# Patient Record
Sex: Female | Born: 1940
Health system: Southern US, Community
[De-identification: ages and names within clinical notes are randomized; demographics above are authoritative.]

## PROBLEM LIST (undated history)

## (undated) DIAGNOSIS — F32A Depression, unspecified: Secondary | ICD-10-CM

## (undated) DIAGNOSIS — C801 Malignant (primary) neoplasm, unspecified: Secondary | ICD-10-CM

## (undated) DIAGNOSIS — N390 Urinary tract infection, site not specified: Secondary | ICD-10-CM

## (undated) DIAGNOSIS — T83192A Other mechanical complication of urinary stent, initial encounter: Secondary | ICD-10-CM

## (undated) DIAGNOSIS — Z860100 Personal history of colon polyps, unspecified: Secondary | ICD-10-CM

## (undated) DIAGNOSIS — S62101A Fracture of unspecified carpal bone, right wrist, initial encounter for closed fracture: Secondary | ICD-10-CM

## (undated) DIAGNOSIS — Z8711 Personal history of peptic ulcer disease: Secondary | ICD-10-CM

## (undated) DIAGNOSIS — D509 Iron deficiency anemia, unspecified: Secondary | ICD-10-CM

## (undated) DIAGNOSIS — M1990 Unspecified osteoarthritis, unspecified site: Secondary | ICD-10-CM

## (undated) DIAGNOSIS — Z9989 Dependence on other enabling machines and devices: Secondary | ICD-10-CM

## (undated) DIAGNOSIS — I700 Atherosclerosis of aorta: Secondary | ICD-10-CM

## (undated) DIAGNOSIS — I4891 Unspecified atrial fibrillation: Secondary | ICD-10-CM

## (undated) DIAGNOSIS — I5032 Chronic diastolic (congestive) heart failure: Secondary | ICD-10-CM

## (undated) DIAGNOSIS — Z973 Presence of spectacles and contact lenses: Secondary | ICD-10-CM

## (undated) DIAGNOSIS — Z8619 Personal history of other infectious and parasitic diseases: Secondary | ICD-10-CM

## (undated) DIAGNOSIS — Z992 Dependence on renal dialysis: Secondary | ICD-10-CM

## (undated) DIAGNOSIS — N135 Crossing vessel and stricture of ureter without hydronephrosis: Secondary | ICD-10-CM

## (undated) DIAGNOSIS — N1330 Unspecified hydronephrosis: Secondary | ICD-10-CM

## (undated) DIAGNOSIS — S32000A Wedge compression fracture of unspecified lumbar vertebra, initial encounter for closed fracture: Secondary | ICD-10-CM

## (undated) DIAGNOSIS — T8859XA Other complications of anesthesia, initial encounter: Secondary | ICD-10-CM

## (undated) DIAGNOSIS — R319 Hematuria, unspecified: Secondary | ICD-10-CM

## (undated) DIAGNOSIS — Z87440 Personal history of urinary (tract) infections: Secondary | ICD-10-CM

## (undated) DIAGNOSIS — I480 Paroxysmal atrial fibrillation: Secondary | ICD-10-CM

## (undated) DIAGNOSIS — E785 Hyperlipidemia, unspecified: Secondary | ICD-10-CM

## (undated) DIAGNOSIS — Z87442 Personal history of urinary calculi: Secondary | ICD-10-CM

## (undated) DIAGNOSIS — R0600 Dyspnea, unspecified: Secondary | ICD-10-CM

## (undated) DIAGNOSIS — K5790 Diverticulosis of intestine, part unspecified, without perforation or abscess without bleeding: Secondary | ICD-10-CM

## (undated) DIAGNOSIS — K449 Diaphragmatic hernia without obstruction or gangrene: Secondary | ICD-10-CM

## (undated) DIAGNOSIS — R531 Weakness: Secondary | ICD-10-CM

## (undated) DIAGNOSIS — Z8719 Personal history of other diseases of the digestive system: Secondary | ICD-10-CM

## (undated) DIAGNOSIS — I440 Atrioventricular block, first degree: Secondary | ICD-10-CM

## (undated) DIAGNOSIS — N8111 Cystocele, midline: Secondary | ICD-10-CM

## (undated) DIAGNOSIS — F419 Anxiety disorder, unspecified: Secondary | ICD-10-CM

## (undated) DIAGNOSIS — Z8542 Personal history of malignant neoplasm of other parts of uterus: Secondary | ICD-10-CM

## (undated) DIAGNOSIS — Z7901 Long term (current) use of anticoagulants: Secondary | ICD-10-CM

## (undated) DIAGNOSIS — Z960 Presence of urogenital implants: Secondary | ICD-10-CM

## (undated) DIAGNOSIS — I10 Essential (primary) hypertension: Secondary | ICD-10-CM

## (undated) DIAGNOSIS — Z9911 Dependence on respirator [ventilator] status: Secondary | ICD-10-CM

## (undated) DIAGNOSIS — K219 Gastro-esophageal reflux disease without esophagitis: Secondary | ICD-10-CM

## (undated) DIAGNOSIS — I2510 Atherosclerotic heart disease of native coronary artery without angina pectoris: Secondary | ICD-10-CM

## (undated) HISTORY — DX: Urinary tract infection, site not specified: N39.0

## (undated) HISTORY — PX: CARDIOVERSION: SHX1299

## (undated) HISTORY — PX: TRANSTHORACIC ECHOCARDIOGRAM: SHX275

## (undated) HISTORY — PX: JOINT REPLACEMENT: SHX530

## (undated) HISTORY — PX: NEPHROLITHOTOMY: SUR881

## (undated) HISTORY — PX: EYE SURGERY: SHX253

## (undated) HISTORY — PX: BACK SURGERY: SHX140

## (undated) HISTORY — PX: TOTAL KNEE ARTHROPLASTY: SHX125

## (undated) HISTORY — PX: APPENDECTOMY: SHX54

## (undated) HISTORY — DX: Other mechanical complication of indwelling ureteral stent, initial encounter: T83.192A

---

## 1987-10-28 HISTORY — PX: TOTAL ABDOMINAL HYSTERECTOMY W/ BILATERAL SALPINGOOPHORECTOMY: SHX83

## 2005-09-02 ENCOUNTER — Ambulatory Visit (HOSPITAL_COMMUNITY): Admission: RE | Admit: 2005-09-02 | Discharge: 2005-09-02 | Payer: Self-pay | Admitting: Urology

## 2005-10-03 ENCOUNTER — Ambulatory Visit (HOSPITAL_BASED_OUTPATIENT_CLINIC_OR_DEPARTMENT_OTHER): Admission: RE | Admit: 2005-10-03 | Discharge: 2005-10-03 | Payer: Self-pay | Admitting: Urology

## 2005-10-03 ENCOUNTER — Ambulatory Visit (HOSPITAL_COMMUNITY): Admission: RE | Admit: 2005-10-03 | Discharge: 2005-10-03 | Payer: Self-pay | Admitting: Urology

## 2005-10-04 HISTORY — PX: OTHER SURGICAL HISTORY: SHX169

## 2006-03-04 ENCOUNTER — Ambulatory Visit (HOSPITAL_COMMUNITY): Admission: RE | Admit: 2006-03-04 | Discharge: 2006-03-04 | Payer: Self-pay | Admitting: Urology

## 2006-04-03 ENCOUNTER — Ambulatory Visit (HOSPITAL_BASED_OUTPATIENT_CLINIC_OR_DEPARTMENT_OTHER): Admission: RE | Admit: 2006-04-03 | Discharge: 2006-04-03 | Payer: Self-pay | Admitting: Urology

## 2006-04-03 HISTORY — PX: OTHER SURGICAL HISTORY: SHX169

## 2006-05-04 ENCOUNTER — Ambulatory Visit (HOSPITAL_COMMUNITY): Admission: RE | Admit: 2006-05-04 | Discharge: 2006-05-04 | Payer: Self-pay | Admitting: Urology

## 2008-10-27 HISTORY — PX: CHOLECYSTECTOMY: SHX55

## 2009-07-10 ENCOUNTER — Encounter: Admission: RE | Admit: 2009-07-10 | Discharge: 2009-07-10 | Payer: Self-pay

## 2011-03-14 NOTE — Op Note (Signed)
[name], [name]             ACCOUNT NO.:  [id number]   MEDICAL RECORD NO.:  [id number]          PATIENT TYPE:  AMB   LOCATION:  [facility]                         FACILITY:  [facility]   PHYSICIAN:  Sigmund I. Gaynelle Arabian, M.D.DATE OF BIRTH:  September 26, 1941   DATE OF PROCEDURE:  10/04/2005  DATE OF DISCHARGE:                                 OPERATIVE REPORT   PREOPERATIVE DIAGNOSIS:  History of bilateral ureteral stones with right  ureteral stricture.   POSTOPERATIVE DIAGNOSIS:  History of bilateral ureteral stones with right  ureteral stricture.   OPERATION:  1.  Cystourethroscopy.  2.  Bilateral retrograde pyelogram with interpretation.  3.  Right ureteroscopy.  4.  Right double-J catheter (6 x 28 cm [employer]).   PREPARATION:  After appropriate preanesthesia, the patient is brought to the  operating room, placed on the operating table in dorsal supine position  where general LMA anesthesia was introduced. She was then re-placed in  dorsal lithotomy position where the pubis was prepped with Betadine solution  and draped in usual fashion.   REVIEW OF HISTORY:  This 70 year old female has had multiple bilateral  ureteroscope procedures for kidney stones, and suffers from right ureteral  strictures postoperatively. She is now for ureteroscopic delineation of  stricture disease.   Note, she has a nuclear medicine scan, which shows 46% flow in the left, 53%  in the right, with normal flow, normal cortical uptake, normal excretion and  clearance within the left kidney. There was no evidence for high-grade  stricture on the right, and there was normal flow, normal cortical uptake,  and excretion from the right kidney. However, there was impaired clearance  from the right collecting system consistent with partial obstruction.   The CT scan for mesh per radiology shows interval obstructive right  hydronephrosis and right ureterectasis extending to the pelvic ureter on  examination  of September2006.   The patient is now for examination.   PROCEDURE:  Cystourethroscopy was accomplished and showed normal-appearing  bladder with clear reflux from both orifices. Left retrograde pyelogram was  performed which showed no obstruction, no hydronephrosis. There was  excellent emptying.   A right retrograde pyelogram is performed, which showed the patient had  ureterectasis on the right side with suggestion of a stricture at the level  of the ureteral vesicle junction or just slightly higher. In addition, the  patient seemed to have some narrowing in the upper third ureter. There was  no stone identified bilaterally. The ureter mucosa was normal bilaterally.   Ureteroscopy was accomplished on the right side, and the 6-French  ureteroscope was able to be passed into the kidney without difficulty. There  was some tightening at the lower area, but is not felt to be severely  strictured. The ureteroscope went freely into the renal pelvis. Becausre of  history of ureteral scarring, and because of current instrumentation,  it  was elected to place a JJ catheter. Therefore, a 18F X 28cm [employer]  JJ catheter was inserted in the usual fashion, over the guidewire.   It was felt that the patient did have some scarring  of the muscular portion  of the ureter in the right lower portion, but it was not obstructive, and I  do not think she needs to have open surgical repair at this time. She does  need to have her Lasix renal scan followed to see if it changes, however.  Note, she had B&O suppository.      Sigmund I. Gaynelle Arabian, M.D.  Electronically Signed     SIT/MEDQ  D:  10/03/2005  T:  10/03/2005  Job:  [id number]

## 2011-03-14 NOTE — Op Note (Signed)
[name], [name] [name]             ACCOUNT NO.:  [id number]   MEDICAL RECORD NO.:  [id number]          PATIENT TYPE:  AMB   LOCATION:  [facility]                         [facility]ACILITY:  [facility]   PHYSICIAN:  Sigmund I. Gaynelle Arabian, M.D.DATE OF BIRTH:  [birth year]   DATE OF PROCEDURE:  04/03/2006  DATE OF DISCHARGE:                                 OPERATIVE REPORT   PREOPERATIVE DIAGNOSIS:  Impacted left lower ureteral calculus.   POSTOPERATIVE DIAGNOSIS:  Impacted left lower ureteral calculus.   OPERATION:  Cystourethroscopy, left retrograde pyelogram with  interpretation, left ureteroscopy, basket extraction of left ureteral  calculus.   SURGEON:  Sigmund I. Gaynelle Arabian, M.D.   ANESTHESIA:  General LMA.   PREPARATION:  After appropriate preanesthesia, the patient is brought to the  operating room, placed on the operating room table in the dorsal supine  position where general LMA anesthesia was induced.  The patient was then  replaced in the dorsal lithotomy position where the pubis was prepped with  Betadine solution and draped in the usual fashion.   REVIEW OF HISTORY:  This 70 year old female has a history of multiple  recurrent stones and currently has a left lower ureteral calculus measuring  3 mm, which is impacted, and not progressive.  She is now for basket  extraction.   PROCEDURE:  Left retrograde pyelogram was performed which showed that the  patient had a 3 mm calculus in the left lower third ureter.  It is in the  same position as seen on CT scan approximately 1-2 weeks ago.  The patient  has been unable pass her stone and has continued to have intermittent colic.   Following retrograde pyelography, a guidewire was passed around the stone  into the left renal pelvis and coiled.  This served as a Chiropodist.  A 6.5  French short ureteroscope was then passed through the left ureteral orifice  into the left ureter.  The stone was identified and basket extracted.  The  area of  stone was easily identified, but it was felt that the patient did  not have to have a double-J catheter.  Therefore, she was given a BNO  suppository, as well as IV Toradol, awakened and taken to the recovery room  in good condition.      Sigmund I. Gaynelle Arabian, M.D.  Electronically Signed     SIT/MEDQ  D:  04/03/2006  T:  04/04/2006  Job:  [id number]

## 2012-04-26 HISTORY — PX: LUMBAR FUSION: SHX111

## 2012-10-27 DIAGNOSIS — Z9911 Dependence on respirator [ventilator] status: Secondary | ICD-10-CM

## 2012-10-27 DIAGNOSIS — N189 Chronic kidney disease, unspecified: Secondary | ICD-10-CM | POA: Insufficient documentation

## 2012-10-27 DIAGNOSIS — Z9289 Personal history of other medical treatment: Secondary | ICD-10-CM

## 2012-10-27 HISTORY — DX: Personal history of other medical treatment: Z92.89

## 2012-10-27 HISTORY — DX: Chronic kidney disease, unspecified: N18.9

## 2012-10-27 HISTORY — DX: Dependence on respirator (ventilator) status: Z99.11

## 2012-10-27 HISTORY — PX: PERCUTANEOUS NEPHROSTOMY: SHX2208

## 2013-05-18 DIAGNOSIS — Z006 Encounter for examination for normal comparison and control in clinical research program: Secondary | ICD-10-CM | POA: Diagnosis not present

## 2013-05-18 DIAGNOSIS — I10 Essential (primary) hypertension: Secondary | ICD-10-CM | POA: Diagnosis not present

## 2013-05-18 DIAGNOSIS — E7800 Pure hypercholesterolemia, unspecified: Secondary | ICD-10-CM | POA: Diagnosis not present

## 2013-06-02 HISTORY — PX: OTHER SURGICAL HISTORY: SHX169

## 2013-06-04 ENCOUNTER — Encounter (HOSPITAL_COMMUNITY): Payer: Self-pay | Admitting: Adult Health

## 2013-06-04 ENCOUNTER — Inpatient Hospital Stay (HOSPITAL_COMMUNITY)
Admission: AD | Admit: 2013-06-04 | Discharge: 2013-06-16 | DRG: 853 | Disposition: A | Discharge status: Home Health | Payer: Medicare Other | Source: Other Acute Inpatient Hospital | Attending: Internal Medicine | Admitting: Internal Medicine

## 2013-06-04 ENCOUNTER — Inpatient Hospital Stay (HOSPITAL_COMMUNITY): Payer: Medicare Other

## 2013-06-04 DIAGNOSIS — E876 Hypokalemia: Secondary | ICD-10-CM | POA: Diagnosis not present

## 2013-06-04 DIAGNOSIS — J9601 Acute respiratory failure with hypoxia: Secondary | ICD-10-CM | POA: Diagnosis present

## 2013-06-04 DIAGNOSIS — I428 Other cardiomyopathies: Secondary | ICD-10-CM | POA: Diagnosis present

## 2013-06-04 DIAGNOSIS — E874 Mixed disorder of acid-base balance: Secondary | ICD-10-CM | POA: Diagnosis present

## 2013-06-04 DIAGNOSIS — I4891 Unspecified atrial fibrillation: Secondary | ICD-10-CM | POA: Diagnosis present

## 2013-06-04 DIAGNOSIS — R7881 Bacteremia: Secondary | ICD-10-CM | POA: Diagnosis present

## 2013-06-04 DIAGNOSIS — A4151 Sepsis due to Escherichia coli [E. coli]: Principal | ICD-10-CM | POA: Diagnosis present

## 2013-06-04 DIAGNOSIS — Z87440 Personal history of urinary (tract) infections: Secondary | ICD-10-CM

## 2013-06-04 DIAGNOSIS — B9620 Unspecified Escherichia coli [E. coli] as the cause of diseases classified elsewhere: Secondary | ICD-10-CM | POA: Diagnosis present

## 2013-06-04 DIAGNOSIS — R310 Gross hematuria: Secondary | ICD-10-CM | POA: Diagnosis not present

## 2013-06-04 DIAGNOSIS — Z79899 Other long term (current) drug therapy: Secondary | ICD-10-CM

## 2013-06-04 DIAGNOSIS — I214 Non-ST elevation (NSTEMI) myocardial infarction: Secondary | ICD-10-CM | POA: Diagnosis present

## 2013-06-04 DIAGNOSIS — Y849 Medical procedure, unspecified as the cause of abnormal reaction of the patient, or of later complication, without mention of misadventure at the time of the procedure: Secondary | ICD-10-CM | POA: Diagnosis not present

## 2013-06-04 DIAGNOSIS — N1832 Chronic kidney disease, stage 3b: Secondary | ICD-10-CM | POA: Diagnosis present

## 2013-06-04 DIAGNOSIS — E669 Obesity, unspecified: Secondary | ICD-10-CM | POA: Diagnosis present

## 2013-06-04 DIAGNOSIS — N12 Tubulo-interstitial nephritis, not specified as acute or chronic: Secondary | ICD-10-CM

## 2013-06-04 DIAGNOSIS — M109 Gout, unspecified: Secondary | ICD-10-CM | POA: Diagnosis not present

## 2013-06-04 DIAGNOSIS — N200 Calculus of kidney: Secondary | ICD-10-CM | POA: Diagnosis present

## 2013-06-04 DIAGNOSIS — IMO0002 Reserved for concepts with insufficient information to code with codable children: Secondary | ICD-10-CM | POA: Diagnosis not present

## 2013-06-04 DIAGNOSIS — I2510 Atherosclerotic heart disease of native coronary artery without angina pectoris: Secondary | ICD-10-CM | POA: Diagnosis present

## 2013-06-04 DIAGNOSIS — R1110 Vomiting, unspecified: Secondary | ICD-10-CM | POA: Diagnosis not present

## 2013-06-04 DIAGNOSIS — E872 Acidosis, unspecified: Secondary | ICD-10-CM

## 2013-06-04 DIAGNOSIS — Z7901 Long term (current) use of anticoagulants: Secondary | ICD-10-CM

## 2013-06-04 DIAGNOSIS — N179 Acute kidney failure, unspecified: Secondary | ICD-10-CM | POA: Diagnosis present

## 2013-06-04 DIAGNOSIS — I129 Hypertensive chronic kidney disease with stage 1 through stage 4 chronic kidney disease, or unspecified chronic kidney disease: Secondary | ICD-10-CM | POA: Diagnosis present

## 2013-06-04 DIAGNOSIS — Y9210 Unspecified residential institution as the place of occurrence of the external cause: Secondary | ICD-10-CM | POA: Diagnosis not present

## 2013-06-04 DIAGNOSIS — N135 Crossing vessel and stricture of ureter without hydronephrosis: Secondary | ICD-10-CM

## 2013-06-04 DIAGNOSIS — K219 Gastro-esophageal reflux disease without esophagitis: Secondary | ICD-10-CM | POA: Diagnosis present

## 2013-06-04 DIAGNOSIS — A419 Sepsis, unspecified organism: Secondary | ICD-10-CM

## 2013-06-04 DIAGNOSIS — Z87442 Personal history of urinary calculi: Secondary | ICD-10-CM

## 2013-06-04 DIAGNOSIS — N1330 Unspecified hydronephrosis: Secondary | ICD-10-CM | POA: Diagnosis present

## 2013-06-04 DIAGNOSIS — D649 Anemia, unspecified: Secondary | ICD-10-CM | POA: Diagnosis present

## 2013-06-04 DIAGNOSIS — N189 Chronic kidney disease, unspecified: Secondary | ICD-10-CM | POA: Diagnosis present

## 2013-06-04 DIAGNOSIS — R7309 Other abnormal glucose: Secondary | ICD-10-CM | POA: Diagnosis present

## 2013-06-04 DIAGNOSIS — R21 Rash and other nonspecific skin eruption: Secondary | ICD-10-CM | POA: Diagnosis not present

## 2013-06-04 DIAGNOSIS — J9600 Acute respiratory failure, unspecified whether with hypoxia or hypercapnia: Secondary | ICD-10-CM | POA: Diagnosis present

## 2013-06-04 DIAGNOSIS — Z6838 Body mass index (BMI) 38.0-38.9, adult: Secondary | ICD-10-CM

## 2013-06-04 DIAGNOSIS — E785 Hyperlipidemia, unspecified: Secondary | ICD-10-CM | POA: Diagnosis present

## 2013-06-04 DIAGNOSIS — R197 Diarrhea, unspecified: Secondary | ICD-10-CM | POA: Diagnosis present

## 2013-06-04 DIAGNOSIS — R6521 Severe sepsis with septic shock: Secondary | ICD-10-CM

## 2013-06-04 DIAGNOSIS — N170 Acute kidney failure with tubular necrosis: Secondary | ICD-10-CM | POA: Diagnosis present

## 2013-06-04 DIAGNOSIS — R609 Edema, unspecified: Secondary | ICD-10-CM | POA: Diagnosis not present

## 2013-06-04 HISTORY — DX: Hyperlipidemia, unspecified: E78.5

## 2013-06-04 HISTORY — DX: Tubulo-interstitial nephritis, not specified as acute or chronic: N12

## 2013-06-04 HISTORY — DX: Acidosis: E87.2

## 2013-06-04 HISTORY — DX: Crossing vessel and stricture of ureter without hydronephrosis: N13.5

## 2013-06-04 HISTORY — DX: Chronic kidney disease, stage 3b: N18.32

## 2013-06-04 HISTORY — DX: Gastro-esophageal reflux disease without esophagitis: K21.9

## 2013-06-04 HISTORY — DX: Acute kidney failure, unspecified: N17.9

## 2013-06-04 HISTORY — DX: Acidosis, unspecified: E87.20

## 2013-06-04 HISTORY — DX: Sepsis, unspecified organism: A41.9

## 2013-06-04 HISTORY — DX: Acute respiratory failure with hypoxia: J96.01

## 2013-06-04 LAB — POCT I-STAT 3, ART BLOOD GAS (G3+)
Acid-base deficit: 25 mmol/L — ABNORMAL HIGH (ref 0.0–2.0)
Acid-base deficit: 24 mmol/L — ABNORMAL HIGH (ref 0.0–2.0)
Acid-base deficit: 15 mmol/L — ABNORMAL HIGH (ref 0.0–2.0)
Bicarbonate: 10.1 mEq/L — ABNORMAL LOW (ref 20.0–24.0)
Bicarbonate: 9.1 mEq/L — ABNORMAL LOW (ref 20.0–24.0)
Bicarbonate: 13.5 mEq/L — ABNORMAL LOW (ref 20.0–24.0)
O2 Saturation: 85 %
O2 Saturation: 83 %
O2 Saturation: 97 %
Patient temperature: 98.6
Patient temperature: 99.4
Patient temperature: 97.1
TCO2: 12 mmol/L (ref 0–100)
TCO2: 11 mmol/L (ref 0–100)
TCO2: 15 mmol/L (ref 0–100)
pCO2 arterial: 60.1 mmHg (ref 35.0–45.0)
pCO2 arterial: 49.4 mmHg — ABNORMAL HIGH (ref 35.0–45.0)
pCO2 arterial: 38.1 mmHg (ref 35.0–45.0)
pH, Arterial: 6.833 — CL (ref 7.350–7.450)
pH, Arterial: 6.875 — CL (ref 7.350–7.450)
pH, Arterial: 7.153 — CL (ref 7.350–7.450)
pO2, Arterial: 89 mmHg (ref 80.0–100.0)
pO2, Arterial: 82 mmHg (ref 80.0–100.0)
pO2, Arterial: 107 mmHg — ABNORMAL HIGH (ref 80.0–100.0)

## 2013-06-04 LAB — BLOOD GAS, ARTERIAL
Acid-base deficit: 8.5 mmol/L — ABNORMAL HIGH (ref 0.0–2.0)
Bicarbonate: 17 mEq/L — ABNORMAL LOW (ref 20.0–24.0)
Drawn by: 23604
FIO2: 1 %
MECHVT: 460 mL
O2 Saturation: 99.5 %
PEEP: 5 cmH2O
Patient temperature: 95.5
RATE: 20 resp/min
TCO2: 18.2 mmol/L (ref 0–100)
pCO2 arterial: 34.9 mmHg — ABNORMAL LOW (ref 35.0–45.0)
pH, Arterial: 7.298 — ABNORMAL LOW (ref 7.350–7.450)
pO2, Arterial: 168 mmHg — ABNORMAL HIGH (ref 80.0–100.0)

## 2013-06-04 LAB — URINALYSIS, ROUTINE W REFLEX MICROSCOPIC
Glucose, UA: NEGATIVE mg/dL
Ketones, ur: 15 mg/dL — AB
Nitrite: NEGATIVE
Protein, ur: 100 mg/dL — AB
Specific Gravity, Urine: 1.018 (ref 1.005–1.030)
Urobilinogen, UA: 1 mg/dL (ref 0.0–1.0)
pH: 5 (ref 5.0–8.0)

## 2013-06-04 LAB — URINE MICROSCOPIC-ADD ON

## 2013-06-04 LAB — CBC
HCT: 40.8 % (ref 36.0–46.0)
Hemoglobin: 12.7 g/dL (ref 12.0–15.0)
MCH: 26.5 pg (ref 26.0–34.0)
MCHC: 31.1 g/dL (ref 30.0–36.0)
MCV: 85 fL (ref 78.0–100.0)
Platelets: 273 10*3/uL (ref 150–400)
RBC: 4.8 MIL/uL (ref 3.87–5.11)
RDW: 16.3 % — ABNORMAL HIGH (ref 11.5–15.5)
WBC: 32.9 10*3/uL — ABNORMAL HIGH (ref 4.0–10.5)

## 2013-06-04 LAB — RENAL FUNCTION PANEL
Albumin: 2 g/dL — ABNORMAL LOW (ref 3.5–5.2)
BUN: 34 mg/dL — ABNORMAL HIGH (ref 6–23)
CO2: 14 mEq/L — ABNORMAL LOW (ref 19–32)
Calcium: 6.4 mg/dL — CL (ref 8.4–10.5)
Chloride: 107 mEq/L (ref 96–112)
Creatinine, Ser: 2.58 mg/dL — ABNORMAL HIGH (ref 0.50–1.10)
GFR calc Af Amer: 20 mL/min — ABNORMAL LOW (ref >90)
GFR calc non Af Amer: 18 mL/min — ABNORMAL LOW (ref >90)
Glucose, Bld: 105 mg/dL — ABNORMAL HIGH (ref 70–99)
Phosphorus: 3.9 mg/dL (ref 2.3–4.6)
Potassium: 4.5 mEq/L (ref 3.5–5.1)
Sodium: 137 mEq/L (ref 135–145)

## 2013-06-04 LAB — GLUCOSE, CAPILLARY
Glucose-Capillary: 99 mg/dL (ref 70–99)
Glucose-Capillary: 95 mg/dL (ref 70–99)
Glucose-Capillary: 74 mg/dL (ref 70–99)

## 2013-06-04 LAB — COMPREHENSIVE METABOLIC PANEL
ALT: 30 U/L (ref 0–35)
AST: 74 U/L — ABNORMAL HIGH (ref 0–37)
Albumin: 2.4 g/dL — ABNORMAL LOW (ref 3.5–5.2)
Alkaline Phosphatase: 63 U/L (ref 39–117)
BUN: 40 mg/dL — ABNORMAL HIGH (ref 6–23)
CO2: 11 mEq/L — ABNORMAL LOW (ref 19–32)
Calcium: 6.8 mg/dL — ABNORMAL LOW (ref 8.4–10.5)
Chloride: 110 mEq/L (ref 96–112)
Creatinine, Ser: 3.22 mg/dL — ABNORMAL HIGH (ref 0.50–1.10)
GFR calc Af Amer: 16 mL/min — ABNORMAL LOW (ref >90)
GFR calc non Af Amer: 13 mL/min — ABNORMAL LOW (ref >90)
Glucose, Bld: 128 mg/dL — ABNORMAL HIGH (ref 70–99)
Potassium: 4.9 mEq/L (ref 3.5–5.1)
Sodium: 137 mEq/L (ref 135–145)
Total Bilirubin: 0.6 mg/dL (ref 0.3–1.2)
Total Protein: 5.8 g/dL — ABNORMAL LOW (ref 6.0–8.3)

## 2013-06-04 LAB — PRO B NATRIURETIC PEPTIDE: Pro B Natriuretic peptide (BNP): 22349 pg/mL — ABNORMAL HIGH (ref 0–125)

## 2013-06-04 LAB — PROTIME-INR
INR: 1.63 — ABNORMAL HIGH (ref 0.00–1.49)
Prothrombin Time: 18.9 seconds — ABNORMAL HIGH (ref 11.6–15.2)

## 2013-06-04 LAB — PHOSPHORUS: Phosphorus: 5.1 mg/dL — ABNORMAL HIGH (ref 2.3–4.6)

## 2013-06-04 LAB — TROPONIN I
Troponin I: 2.44 ng/mL (ref <0.30)
Troponin I: 3.89 ng/mL (ref <0.30)

## 2013-06-04 LAB — CORTISOL: Cortisol, Plasma: 19.6 ug/dL

## 2013-06-04 LAB — MAGNESIUM: Magnesium: 1.4 mg/dL — ABNORMAL LOW (ref 1.5–2.5)

## 2013-06-04 LAB — PROCALCITONIN: Procalcitonin: >175 ng/mL

## 2013-06-04 LAB — LACTIC ACID, PLASMA: Lactic Acid, Venous: 4.7 mmol/L — ABNORMAL HIGH (ref 0.5–2.2)

## 2013-06-04 LAB — MRSA PCR SCREENING: MRSA by PCR: NEGATIVE

## 2013-06-04 MED ORDER — CHLORHEXIDINE GLUCONATE 0.12 % MT SOLN
15.0000 mL | Freq: Two times a day (BID) | OROMUCOSAL | Status: DC
  Administered 2013-06-04 – 2013-06-07 (×7): 15 mL via OROMUCOSAL
  Filled 2013-06-04 (×16): qty 15

## 2013-06-04 MED ORDER — BIOTENE DRY MOUTH MT LIQD
15.0000 mL | Freq: Four times a day (QID) | OROMUCOSAL | Status: DC
  Administered 2013-06-05 – 2013-06-10 (×17): 15 mL via OROMUCOSAL

## 2013-06-04 MED ORDER — PANTOPRAZOLE SODIUM 40 MG IV SOLR
40.0000 mg | Freq: Every day | INTRAVENOUS | Status: DC
  Administered 2013-06-04 – 2013-06-07 (×4): 40 mg via INTRAVENOUS
  Filled 2013-06-04 (×5): qty 40

## 2013-06-04 MED ORDER — DEXTROSE 50 % IV SOLN: 25.0000 mL | Freq: Once | INTRAVENOUS | Status: AC | PRN

## 2013-06-04 MED ORDER — IOHEXOL 300 MG/ML  SOLN
50.0000 mL | Freq: Once | INTRAMUSCULAR | Status: AC | PRN
  Administered 2013-06-04: 20 mL via INTRAVENOUS

## 2013-06-04 MED ORDER — PRISMASOL BGK 4/2.5 32-4-2.5 MEQ/L IV SOLN
INTRAVENOUS | Status: DC
  Administered 2013-06-04 – 2013-06-06 (×9): via INTRAVENOUS_CENTRAL
  Filled 2013-06-04 (×14): qty 5000

## 2013-06-04 MED ORDER — PIPERACILLIN-TAZOBACTAM IN DEX 2-0.25 GM/50ML IV SOLN
2.2500 g | Freq: Three times a day (TID) | INTRAVENOUS | Status: DC
  Filled 2013-06-04 (×3): qty 50

## 2013-06-04 MED ORDER — HYDROCORTISONE SOD SUCCINATE 100 MG IJ SOLR
50.0000 mg | Freq: Four times a day (QID) | INTRAMUSCULAR | Status: DC
  Administered 2013-06-04 – 2013-06-05 (×3): 50 mg via INTRAVENOUS
  Filled 2013-06-04 (×7): qty 1

## 2013-06-04 MED ORDER — MIDAZOLAM HCL 2 MG/2ML IJ SOLN
INTRAMUSCULAR | Status: AC
  Filled 2013-06-04: qty 4

## 2013-06-04 MED ORDER — DEXTROSE 50 % IV SOLN
INTRAVENOUS | Status: AC
  Administered 2013-06-05: 25 mL
  Filled 2013-06-04: qty 50

## 2013-06-04 MED ORDER — MEPERIDINE HCL 50 MG/ML IJ SOLN
INTRAMUSCULAR | Status: AC
  Filled 2013-06-04: qty 1

## 2013-06-04 MED ORDER — SODIUM CHLORIDE 0.9 % IV SOLN
1.0000 g | Freq: Once | INTRAVENOUS | Status: AC
  Administered 2013-06-04: 1 g via INTRAVENOUS
  Filled 2013-06-04: qty 10

## 2013-06-04 MED ORDER — FENTANYL CITRATE 0.05 MG/ML IJ SOLN
INTRAMUSCULAR | Status: AC
  Filled 2013-06-04: qty 4

## 2013-06-04 MED ORDER — SODIUM BICARBONATE 8.4 % IV SOLN
INTRAVENOUS | Status: AC
  Administered 2013-06-04: 100 meq
  Filled 2013-06-04: qty 100

## 2013-06-04 MED ORDER — SODIUM BICARBONATE 8.4 % IV SOLN
100.0000 meq | Freq: Once | INTRAVENOUS | Status: AC
  Administered 2013-06-04: 100 meq via INTRAVENOUS

## 2013-06-04 MED ORDER — MIDAZOLAM HCL 2 MG/2ML IJ SOLN
1.0000 mg | INTRAMUSCULAR | Status: DC | PRN
  Administered 2013-06-04 – 2013-06-06 (×4): 2 mg via INTRAVENOUS
  Filled 2013-06-04 (×4): qty 2

## 2013-06-04 MED ORDER — HEPARIN SODIUM (PORCINE) 1000 UNIT/ML DIALYSIS
1000.0000 [IU] | INTRAMUSCULAR | Status: DC | PRN
  Administered 2013-06-04: 3000 [IU] via INTRAVENOUS_CENTRAL
  Administered 2013-06-06: 2400 [IU] via INTRAVENOUS_CENTRAL
  Filled 2013-06-04 (×2): qty 3
  Filled 2013-06-04: qty 6

## 2013-06-04 MED ORDER — SODIUM BICARBONATE 8.4 % IV SOLN
INTRAVENOUS | Status: AC
  Filled 2013-06-04: qty 100

## 2013-06-04 MED ORDER — ASPIRIN 300 MG RE SUPP
300.0000 mg | RECTAL | Status: AC
  Administered 2013-06-04: 300 mg via RECTAL
  Filled 2013-06-04: qty 1

## 2013-06-04 MED ORDER — ASPIRIN 81 MG PO CHEW: 324.0000 mg | CHEWABLE_TABLET | ORAL | Status: AC

## 2013-06-04 MED ORDER — PRISMASOL BGK 4/2.5 32-4-2.5 MEQ/L IV SOLN
INTRAVENOUS | Status: DC
  Administered 2013-06-04 – 2013-06-06 (×10): via INTRAVENOUS_CENTRAL
  Filled 2013-06-04 (×23): qty 5000

## 2013-06-04 MED ORDER — FENTANYL CITRATE 0.05 MG/ML IJ SOLN
25.0000 ug | INTRAMUSCULAR | Status: DC | PRN
  Administered 2013-06-04 – 2013-06-10 (×3): 50 ug via INTRAVENOUS
  Filled 2013-06-04 (×3): qty 2

## 2013-06-04 MED ORDER — FENTANYL CITRATE 0.05 MG/ML IJ SOLN
INTRAMUSCULAR | Status: AC | PRN
  Administered 2013-06-04: 50 ug via INTRAVENOUS

## 2013-06-04 MED ORDER — PRISMASOL BGK 4/2.5 32-4-2.5 MEQ/L IV SOLN
INTRAVENOUS | Status: DC
  Administered 2013-06-04 – 2013-06-06 (×9): via INTRAVENOUS_CENTRAL
  Filled 2013-06-04 (×14): qty 5000

## 2013-06-04 MED ORDER — STERILE WATER FOR INJECTION IV SOLN
INTRAVENOUS | Status: DC
  Administered 2013-06-04: 15:00:00 via INTRAVENOUS
  Filled 2013-06-04 (×3): qty 850

## 2013-06-04 MED ORDER — MIDAZOLAM HCL 2 MG/2ML IJ SOLN
INTRAMUSCULAR | Status: AC | PRN
  Administered 2013-06-04: 1 mg via INTRAVENOUS

## 2013-06-04 MED ORDER — PIPERACILLIN-TAZOBACTAM 3.375 G IVPB 30 MIN
3.3750 g | Freq: Four times a day (QID) | INTRAVENOUS | Status: DC
  Administered 2013-06-04 – 2013-06-06 (×8): 3.375 g via INTRAVENOUS
  Filled 2013-06-04 (×11): qty 50

## 2013-06-04 MED ORDER — VASOPRESSIN 20 UNIT/ML IJ SOLN
0.0300 [IU]/min | INTRAVENOUS | Status: DC
  Administered 2013-06-04: 0.03 [IU]/min via INTRAVENOUS
  Filled 2013-06-04: qty 2.5

## 2013-06-04 MED ORDER — NOREPINEPHRINE BITARTRATE 1 MG/ML IJ SOLN
2.0000 ug/min | INTRAVENOUS | Status: DC
  Administered 2013-06-04: 15 ug/min via INTRAVENOUS
  Filled 2013-06-04 (×2): qty 16

## 2013-06-04 MED ORDER — SODIUM CHLORIDE 0.9 % IV SOLN
250.0000 mL | INTRAVENOUS | Status: DC | PRN
  Administered 2013-06-11 (×2): 500 mL via INTRAVENOUS

## 2013-06-04 MED ORDER — HEPARIN SODIUM (PORCINE) 5000 UNIT/ML IJ SOLN
5000.0000 [IU] | Freq: Three times a day (TID) | INTRAMUSCULAR | Status: DC
  Administered 2013-06-04 – 2013-06-06 (×6): 5000 [IU] via SUBCUTANEOUS
  Filled 2013-06-04 (×9): qty 1

## 2013-06-04 MED ORDER — NOREPINEPHRINE BITARTRATE 1 MG/ML IJ SOLN
2.0000 ug/min | INTRAVENOUS | Status: DC
  Administered 2013-06-04: 5 ug/min via INTRAVENOUS
  Filled 2013-06-04 (×2): qty 4

## 2013-06-04 MED ORDER — SODIUM CHLORIDE 0.9 % IV SOLN
INTRAVENOUS | Status: DC
  Administered 2013-06-04: 18:00:00 via INTRAVENOUS

## 2013-06-04 MED ORDER — SODIUM BICARBONATE 8.4 % IV SOLN
INTRAVENOUS | Status: DC
  Administered 2013-06-05 – 2013-06-06 (×3): via INTRAVENOUS
  Filled 2013-06-04 (×6): qty 150

## 2013-06-04 NOTE — Consult Note (Signed)
Subjective: I was called in consultation to see [name] [name] by Dr. Malvin Johns for right hydronephrosis and urosepsis.   She was transferred from [facility] where she has been a patient of one of the [facility] urologists, Dr. Vara Guardian,  and has a history of chronic infections.  She was found to have right hydro on recent IVP and had  cystoscopy with dilation of a right ureteral stricture and stent placement.  The stent came out prematurely in the PACU.  She had some pain that became severe but was managed medically, but on Friday she became febrile and rapidly developed sepsis requiring intubation and transfer to [facility].  She has GNR in her blood and a CT from 8/8 showed marked right hydroureteronephrosis.   She has a history of stones and has had ureteroscopy by Dr. Gaynelle Arabian back in 2007 and was seen in our office with a stone as recently as 2009.   She had some right hydro then without evidence of a stone.    ROS: She is intubated and unable to respond.  Level 5 caveat applies.  Past Medical History  Diagnosis Date  . GERD (gastroesophageal reflux disease)   . Kidney stones   . A-fib   . HTN (hypertension)   . Hyperlipidemia    Past Surgical History  Procedure Laterality Date  . Cystoscopy with ureteroscopy and stent placement Left   . Cystoscopy with stent placement Right   . Lumbar laminectomy     History   Social History  . Marital Status: Married    Spouse Name: N/A    Number of Children: N/A  . Years of Education: N/A   Occupational History  . Not on file.   Social History Main Topics  . Smoking status: Never Smoker   . Smokeless tobacco: Not on file  . Alcohol Use: No  . Drug Use: Not on file  . Sexually Active: Not on file   Other Topics Concern  . Not on file   Social History Narrative  . No narrative on file   No family history on file. Allergies  Allergen Reactions  . Codeine     Objective: Vital signs in last 24 hours: Pulse Rate:  [125] 125  (08/09 1219) Resp:  [14] 14 (08/09 1219) BP: (142)/(87) 142/87 mmHg (08/09 1219) SpO2:  [98 %] 98 % (08/09 1219) FiO2 (%):  [80 %] 80 % (08/09 1219)  Intake/Output from previous day:   Intake/Output this shift:    General appearance: intubated and unresponsive Head: Normocephalic, without obvious abnormality, atraumatic Neck: no adenopathy, no carotid bruit, no JVD, supple, symmetrical, trachea midline and thyroid not enlarged, symmetric, no tenderness/mass/nodules Resp: clear to auscultation bilaterally and on vent Cardio: sinus tachycardia GI: soft, non-tender; bowel sounds normal; no masses,  no organomegaly and no hernias.  She is sedated but didn't respond to palpation Extremities: extremities normal, atraumatic, no cyanosis or edema Skin: Skin color, texture, turgor normal. No rashes or lesions Lymph nodes: normal femoral and inguinal nodes Neurologic: Mental status: Alert, oriented, thought content appropriate, she is sedated on the vent and a neuro exam is not possible.   Lab Results:  No results found for this basename: WBC, HGB, HCT, PLT,  in the last 72 hours BMET No results found for this basename: NA, K, CL, CO2, GLUCOSE, BUN, CREATININE, CALCIUM,  in the last 72 hours PT/INR No results found for this basename: LABPROT, INR,  in the last 72 hours ABG  Recent Labs  06/04/13 1301  PHART 6.833*  HCO3 10.1*    Studies/Results: No results found.  Anti-infectives: Anti-infectives   None      Current Facility-Administered Medications  Medication Dose Route Frequency Provider Last Rate Last Dose  . 0.9 %  sodium chloride infusion  250 mL Intravenous PRN Marijean Heath, NP      . 0.9 %  sodium chloride infusion   Intravenous Continuous Marijean Heath, NP 100 mL/hr at 06/04/13 1251    . aspirin chewable tablet 324 mg  324 mg Oral NOW Marijean Heath, NP       Or  . aspirin suppository 300 mg  300 mg Rectal NOW Marijean Heath, NP      .  fentaNYL (SUBLIMAZE) injection 25-50 mcg  25-50 mcg Intravenous Q2H PRN Marijean Heath, NP      . heparin injection 5,000 Units  5,000 Units Subcutaneous Q8H Marijean Heath, NP      . midazolam (VERSED) injection 1-2 mg  1-2 mg Intravenous Q2H PRN Marijean Heath, NP      . pantoprazole (PROTONIX) injection 40 mg  40 mg Intravenous QHS Marijean Heath, NP      . sodium bicarbonate 150 mEq in sterile water 1,000 mL infusion   Intravenous Continuous Marijean Heath, NP       I have reviewed her CT films from [place] and compared them with prior films from our office. I have reviewed records and labs from [place].  I have discussed the case with Dr. Lamonte Sakai I spoke with the patient's daughter to review the recent and past history.  Assessment: Urosepsis with right ureteral obstruction following right ureteral dilation with premature stent expulsion. History of stones and chronic UTI's.   Plan: She needs a right percutaneous nephrostomy tube acutely to decompress the kidney. She will need additional management of the ureteral stricture after she recovers from the sepsis possibly with a right ureteral reimplantation.   CC: Dr. Court Joy and Dr. Gwen Her.     LOS: 0 days    Cyntia Staley J 06/04/2013

## 2013-06-04 NOTE — Procedures (Signed)
Right 57f perc nephrostomy catheter placement 70ml cloudy urine sent for GS< C&S No complication No blood loss. See complete dictation in [facility].

## 2013-06-04 NOTE — Progress Notes (Signed)
[facility] Progress Note Patient Name: [name] [name] [facility] DOB: 1941/04/18 MRN: [id number]  Date of Service  06/04/2013   HPI/Events of Note  Hypotensive on return from IR   eICU Interventions  Add vaso gtt Bicarb 2 amps Stress dose steroids A-line   Intervention Category Major Interventions: Acid-Base disturbance - evaluation and management;Hypotension - evaluation and management  ALVA,RAKESH V. 06/04/2013, 4:19 PM

## 2013-06-04 NOTE — Procedures (Signed)
Hemodialysis Catheter Insertion Procedure Note Lynetta Seppi [facility] [id number] November 13, 1940  Procedure: Insertion of Hemodialysis Catheter  Indications: CVVH-D  Procedure Details Consent: Risks of procedure as well as the alternatives and risks of each were explained to the (patient/caregiver).  Consent for procedure obtained. Time Out: Verified patient identification, verified procedure, site/side was marked, verified correct patient position, special equipment/implants available, medications/allergies/relevent history reviewed, required imaging and test results available.  Performed  Maximum sterile technique was used including antiseptics, cap, gloves, gown, hand hygiene, mask and sheet. Skin prep: Chlorhexidine; local anesthetic administered A antimicrobial bonded/coated triple lumen catheter was placed in the right internal jugular vein using the Seldinger technique.  Evaluation Blood flow good Complications: No apparent complications Patient did tolerate procedure well. Chest X-ray ordered to verify placement.  CXR: pending.  15cm catheter placed using ultrasound guidance under direct MD supervision.   Darlina Sicilian, NP 06/04/2013, 2:42 PM  Baltazar Apo, MD, PhD 06/04/2013, 3:08 PM [facility] [phone] or if no answer [phone]

## 2013-06-04 NOTE — Progress Notes (Addendum)
ANTIBIOTIC CONSULT NOTE - INITIAL  Pharmacy Consult for Zosyn Indication: Gram-negative sepsis  Allergies  Allergen Reactions  . Codeine     Patient Measurements: Height: 5\' 5"  (165.1 cm) IBW/kg (Calculated) : 57 Weight per chart from [place] 94.7 kg  Vital Signs: BP: 142/87 mmHg (08/09 1219) Pulse Rate: 125 (08/09 1219) Intake/Output from previous day:   Intake/Output from this shift:    Labs: No results found for this basename: WBC, HGB, PLT, LABCREA, CREATININE,  in the last 72 hours CrCl is unknown because no creatinine reading has been taken. No results found for this basename: VANCOTROUGH, Corlis Leak, VANCORANDOM, GENTTROUGH, GENTPEAK, GENTRANDOM, TOBRATROUGH, TOBRAPEAK, TOBRARND, AMIKACINPEAK, AMIKACINTROU, AMIKACIN,  in the last 72 hours  SCr at [facility] 3.3, Estimated CrCl ~15 ml/min Anuric  Microbiology: No results found for this or any previous visit (from the past 720 hour(s)). Blood cultures 2/2 GNR (per call from [employer] lab)  Medical History: Past Medical History  Diagnosis Date  . GERD (gastroesophageal reflux disease)   . Kidney stones   . A-fib   . HTN (hypertension)   . Hyperlipidemia     Medications:  Anti-infectives   None     ABX given at [facility]: Cefepime 2gm (8/8 2045) Primaxin 250mg  (8/8 2345) Linezolid 600mg  (8/9 0015)  Assessment: Sepsis due to Gram-negative bacteremia resulting from kidney obstruction - to begin empiric antimicrobial coverage with Zosyn.  Note she is in acute renal failure and is anuric per report.  Discussed with Dr. Lamonte Sakai - given Gram-negatives in blood and renal failure will not start Vancomycin coverage at this time.  Plan:  Zosyn 2.25gm IV q8h Monitor renal function Follow-up blood culture results from [facility], [place].D., BCPS, AAHIVP Clinical Pharmacist Phone: [phone] or [phone] 06/04/2013, 1:32 PM  06/04/2013, 2:43 PM:  Orders received to begin CVVHDF.  Will increase Zosyn to  3.375gm IV q6h.   Follow CVVHDF tolerance.

## 2013-06-04 NOTE — Consult Note (Signed)
[name] [name] 06/04/2013  Requesting Physician:  Baltazar Apo, CCM  Reason for Consult:  AKI, acidosis, anuria,  HPI:  23F seen at the request of Dr. Lamonte Sakai for the eavluation of AKI, acidosis, septic shock.  Pt developed UTI symptoms earlier this week, unimproved with PO ABX and saw her local urologist -- [place].  She was found to have R sided hydronephrosis and underwent cystoscopy, R ureteral stricture dilation, and stent placement on 8/6.  Stent came out in PACU.  2 days later, yesterday, pt developed fever, chills, and malaise.  She presented to local hospital where she developed septic shock, respiratory failure.  Repeat imaging with R hydroureteronephrosis.  At OSH she developed hypotension req pressors, hypoxic RF req intubation, and mixed acidosis.  UOP was minimal.  Blood cultures already with GNRs.  She has rec imipenem, zosyn, and linezolid.    On arrival here pt is hypoxic, mixed acidosis.  Repositioned foley with some UOP upon arrival.  Per pt's daughter, no known history of CKD or AKI.    ROS: unable to complete ROS 2/2 intubated and sedated  PMH  Past Medical History  Diagnosis Date  . GERD (gastroesophageal reflux disease)   . Kidney stones   . A-fib   . HTN (hypertension)   . Hyperlipidemia    PSH  Past Surgical History  Procedure Laterality Date  . Cystoscopy with ureteroscopy and stent placement Left   . Cystoscopy with stent placement Right   . Lumbar laminectomy     FH unable to complete SH: unable to complete  Allergies  Allergies  Allergen Reactions  . Codeine    Home medications: unknown for now      Current Medications Current Facility-Administered Medications  Medication Dose Route Frequency Provider Last Rate Last Dose  . 0.9 %  sodium chloride infusion  250 mL Intravenous PRN Marijean Heath, NP      . 0.9 %  sodium chloride infusion   Intravenous Continuous Marijean Heath, NP 100 mL/hr at 06/04/13 1251    . aspirin  chewable tablet 324 mg  324 mg Oral NOW Marijean Heath, NP       Or  . aspirin suppository 300 mg  300 mg Rectal NOW Marijean Heath, NP      . fentaNYL (SUBLIMAZE) injection 25-50 mcg  25-50 mcg Intravenous Q2H PRN Marijean Heath, NP      . heparin injection 1,000-6,000 Units  1,000-6,000 Units CRRT PRN Rexene Agent, MD      . heparin injection 5,000 Units  5,000 Units Subcutaneous Q8H Marijean Heath, NP      . midazolam (VERSED) injection 1-2 mg  1-2 mg Intravenous Q2H PRN Marijean Heath, NP      . pantoprazole (PROTONIX) injection 40 mg  40 mg Intravenous QHS Marijean Heath, NP      . piperacillin-tazobactam (ZOSYN) IVPB 2.25 g  2.25 g Intravenous Q8H Norva Riffle, RPH      . prismasol BGK 4/2.5 5,000 mL dialysis replacement fluid   CRRT Continuous Rexene Agent, MD      . prismasol BGK 4/2.5 5,000 mL dialysis replacement fluid   CRRT Continuous Rexene Agent, MD      . prismasol BGK 4/2.5 5,000 mL dialysis solution   CRRT Continuous Rexene Agent, MD      . sodium bicarbonate 150 mEq in sterile water 1,000 mL infusion   Intravenous Continuous Marijean Heath, NP  CBC  Recent Labs Lab 06/04/13 1234  WBC 32.9*  HGB 12.7  HCT 40.8  MCV 85.0  PLT 123456   Basic Metabolic Panel  Recent Labs Lab 06/04/13 1234  NA 137  K 4.9  CL 110  CO2 11*  GLUCOSE 128*  BUN 40*  CREATININE 3.22*  CALCIUM 6.8*  PHOS 5.1*   ABG    Component Value Date/Time   PHART 6.875* 06/04/2013 1358   PCO2ART 49.4* 06/04/2013 1358   PO2ART 82.0 06/04/2013 1358   HCO3 9.1* 06/04/2013 1358   TCO2 11 06/04/2013 1358   ACIDBASEDEF 24.0* 06/04/2013 1358   O2SAT 83.0 06/04/2013 1358     Physical Exam  Blood pressure 142/87, pulse 125, temperature 98.5 F (36.9 C), resp. rate 14, height 5\' 5"  (1.651 m), SpO2 98.00%. Intubated, Sedated Tachycardic, Regular.  Nl s1s2 Coarse bs b/l Abd s/nt/nd Foley in place, brown urine in bag No LEE No rashes ETT in  place NCAT Eyes closed  A/P 77F with septic shock, ATN, metabolic and resp acidosis, hypoxic RF, hypercapneic RF in setting of R sided ureteral obstruction, likey pyelonephritis, and GNR bacteremia.    1. AKI: Cause is multifactorial but dominant problem is likely sepsis related ATN.   Unilateral obstruction typically not associated with renal failure.  Unclear if exposed to nephrtoxins preceding admission.  Will initiate CRRT per #2.   2. Combined Resp and Metabolic Acidosis (both inc AG and normal AG).  Suspect driven by lactic acidosis.  Given her significant hypoxia and persistent acidosis despite improvement in ventilation, will start CRRT for assistance with ATN, in particular her acidosis.  While at [facility] she should cont on HCO3 gtt and afterwards can start CRRT with goal improvement of acidosis.   3. GNR bacteremia / Septic Shock from GU source: per CCM and Urology.  To [facility] today for percutaneous nephrostomy  4. Hypoxic and Hypercapneic RF: ? ARDS.  Per CCM    Pearson Grippe MD 06/04/2013, 2:37 PM

## 2013-06-04 NOTE — H&P (Signed)
PULMONARY  / CRITICAL CARE MEDICINE  Name: [name] [name] MRN: [id number] DOB: Jan 09, 1941    ADMISSION DATE:  06/04/2013  REFERRING MD :  [facility]  PRIMARY SERVICE: PCCM   CHIEF COMPLAINT:  Shock, renal failure   BRIEF PATIENT DESCRIPTION: 72 yo female with hx kidney stones, frequent UTI, HTN tx 8/9 from [facility] with septic shock, acute renal failure and presumed pyelonephritis after failed ureteral stent placement 8/7.   SIGNIFICANT EVENTS / STUDIES:  CT abd/pelvis 8/9>>> mod to severe R hydronephrosis with R sided perinephric stranding and fluid.  Dilation of the ureter to the L of the R hemipelvis, without evidence of a distal obstructing stone.  Pyelonephritis cannot be excluded.  Left renal cyst.   LINES / TUBES: L IJ CVL 8/9>>> ETT 8/9>>> R IJ HD cath 8/9 >>   CULTURES: Blood 8/9 [facility]) >> GNR >>  Urine 8/9>>> BCx2 8/9>>>  ANTIBIOTICS: Linezolid x1 8/9 Imipenem x1 8/9 Zosyn 8/9 >>   HISTORY OF PRESENT ILLNESS:  72 yo female with hx HTN, mult kidney stones, frequent UTI presented 8/8 to [facility] with fevers, chills, AMS.  On 8/7 she underwent cystoscopy and attempted ureteral stent placement for R ureteral stricture.  Per daughter, the stent fell out prior to her d/c post procedure and pt/MD elected to leave it out, hoping that the "baloon" he used would help improve the stricture.  She was d/c home 8/7 but developed worsening abd pain, n/v and was seen in urology office 8/8 am and had foley placed for urinary retention.  She returned home and by that evening developed fevers, chills, nausea, AMS and was taken to [facility] where she was found to be in septic shock with acute renal failure.  She required intubation for worsening resp distress and was tx to [facility].   PAST MEDICAL HISTORY :  Past Medical History  Diagnosis Date  . GERD (gastroesophageal reflux disease)   . Kidney stones   . A-fib   . HTN (hypertension)   .  Hyperlipidemia    Past Surgical History  Procedure Laterality Date  . Cystoscopy with ureteroscopy and stent placement Left   . Cystoscopy with stent placement Right   . Lumbar laminectomy     Prior to Admission medications   Not on File   Allergies  Allergen Reactions  . Codeine     FAMILY HISTORY:  No family history on file. SOCIAL HISTORY:  reports that she has never smoked. She does not have any smokeless tobacco history on file. She reports that she does not drink alcohol. Her drug history is not on file.  REVIEW OF SYSTEMS:  Unable, pt sedated on vent.   VITAL SIGNS: Temp:  [98.5 F (36.9 C)] 98.5 F (36.9 C) (08/09 1300) Pulse Rate:  [125] 125 (08/09 1219) Resp:  [14] 14 (08/09 1300) BP: (142)/(87) 142/87 mmHg (08/09 1219) SpO2:  [98 %] 98 % (08/09 1219) FiO2 (%):  [80 %] 80 % (08/09 1219) HEMODYNAMICS:   VENTILATOR SETTINGS: Vent Mode:  [-] PRVC FiO2 (%):  [80 %] 80 % Set Rate:  [14 bmp-20 bmp] 20 bmp Vt Set:  [460 mL] 460 mL PEEP:  [5 cmH20] 5 cmH20 Plateau Pressure:  [22 cmH20] 22 cmH20 INTAKE / OUTPUT: Intake/Output     08/08 0701 - 08/09 0700 08/09 0701 - 08/10 0700   I.V.  15   Total Intake   15   Urine  170   Total Output  170   Net   -155          PHYSICAL EXAMINATION: General:  Obese female, critically ill  Neuro:  Sedated, withdraws to pain, RASS -3 HEENT:  Mm dry, no JVD Cardiovascular:  s1s2 tachy, distant  Lungs:  resps even, non labored on vent, coarse  Abdomen:  Obese, soft, non tender Ext: cool, clammy, knees mottled, 1+ BLE edema   LABS:  CBC Recent Labs     06/04/13  1234  WBC  32.9*  HGB  12.7  HCT  40.8  PLT  273   Coag's Recent Labs     06/04/13  1325  INR  1.63*   BMET No results found for this basename: NA, K, CL, CO2, BUN, CREATININE, GLUCOSE,  in the last 72 hours Electrolytes No results found for this basename: CALCIUM, MG, PHOS,  in the last 72 hours Sepsis Markers No results found for this basename:  LACTICACIDVEN, PROCALCITON, O2SATVEN,  in the last 72 hours ABG Recent Labs     06/04/13  1301  06/04/13  1358  PHART  6.833*  6.875*  PCO2ART  60.1*  49.4*  PO2ART  89.0  82.0   Liver Enzymes No results found for this basename: AST, ALT, ALKPHOS, BILITOT, ALBUMIN,  in the last 72 hours Cardiac Enzymes No results found for this basename: TROPONINI, PROBNP,  in the last 72 hours Glucose Recent Labs     06/04/13  1250  GLUCAP  99    Imaging Dg Chest Port 1 View  06/04/2013   *RADIOLOGY REPORT*  Clinical Data: Endotracheal tube placement  PORTABLE CHEST - 1 VIEW  Comparison: 06/04/2013  Findings: Endotracheal tube is 2.5 cm above the carina.  Left jugular catheter tip in the SVC unchanged.  No pneumothorax.  NG tube enters the stomach.  Bibasilar atelectasis again noted.  Vascular congestion and mild edema appears improved.  IMPRESSION: Endotracheal tube remains in good position.  Bibasilar atelectasis unchanged.  Improvement in pulmonary edema.   Original Report Authenticated By: Carl Best, M.D.     CXR:  Pending   ASSESSMENT / PLAN:  PULMONARY Acute respiratory failure - in setting septic shock  P:   Vent support - increase RR to 20 with acidosis, hypercarbia  F/u ABG F/u CXR   CARDIOVASCULAR Elevated troponin - likely demand ischemia + ARF Septic shock  P:  F/u lactate  Trend troponin  Volume based on CVP goal 10-12 Wean pressors as able  12 lead ekg   RENAL Acute renal failure  R hydronephrosis  Pyelonephritis  R Ureteral stricture - failed ureteral stent placement 8/7 UTI P:   abx as above  Urology to see (d/w Dr Jeffie Pollock)  Likely needs perc nephrostomy - IR notified  Renal to see - may well need CVVH with acidosis, minimal UOP  GASTROINTESTINAL No active issue  P:   Hold TF for now with planned procedures   HEMATOLOGIC No active issue  P:  Cbc  coags  Sub Q heparin   INFECTIOUS R Pyelonephritis GNR bacteremia (cx from [place]) P:   Abx,  micro as above  Pct  Pan culture   ENDOCRINE Hyperglycemia - not known DM   P:   SSI   NEUROLOGIC AMS - in setting sepsis P:   Intermittent sedation while on vent    I have personally obtained a history, examined the patient, evaluated laboratory and imaging results, formulated the assessment and plan and placed orders. CRITICAL CARE: The patient is critically  ill with multiple organ systems failure and requires high complexity decision making for assessment and support, frequent evaluation and titration of therapies, application of advanced monitoring technologies and extensive interpretation of multiple databases. Critical Care Time devoted to patient care services described in this note is 90 minutes.    *Care during the described time interval was provided by me and/or other providers on the critical care team. I have reviewed this patient's available data, including medical history, events of note, physical examination and test results as part of my evaluation.   Baltazar Apo, MD, PhD 06/04/2013, 2:15 PM [facility] [phone] or if no answer [phone]

## 2013-06-04 NOTE — Progress Notes (Addendum)
Hypoglycemic Event  CBG: 66  Treatment: D5   Symptoms: none  Follow-up CBG: Time:0015 CBG Result:70  Possible Reasons for Event: NPO  Comments/MD notified:[place] MD    Lamar Sprinkles  Remember to initiate Hypoglycemia Order Set & complete

## 2013-06-04 NOTE — Progress Notes (Signed)
Increased RR due to ABG results

## 2013-06-04 NOTE — ED Notes (Signed)
Pt arrived with RN and Respiratory Therapy on vent and bleeding from the mouth. Nurse aware. Pt responsive to some painful stimulus only. Pt turned onto stomach with 4 person assist.

## 2013-06-04 NOTE — Progress Notes (Signed)
CRITICAL VALUE ALERT  Critical value received:  Calcium 6.4  Date of notification:  06/04/2013  Time of notification:  0705  Critical value read back:yes  Nurse who received alert:  Elliot Gurney, RN  MD notified (1st page): Left message with [facility] RN Missy for Dr. Elsworth Soho  Time of first page:  [phone]

## 2013-06-04 NOTE — Procedures (Addendum)
Arterial Catheter Insertion Procedure Note [name] [name] [id number] [birth year]  Procedure: Insertion of Arterial Catheter  Indications: Blood pressure monitoring; Failure to access radial artery x 3.  Procedure Details Consent: Unable to obtain consent because of emergent medical necessity. Time Out: Verified patient identification, verified procedure, site/side was marked, verified correct patient position, special equipment/implants available, medications/allergies/relevent history reviewed, required imaging and test results available.  Performed  Maximum sterile technique was used including antiseptics, cap, gloves, gown, hand hygiene, mask and sheet. Skin prep: Chlorhexidine; local anesthetic administered. 20 gauge catheter was inserted into right femoral artery using the Seldinger technique.  Evaluation Blood flow good; BP tracing good. Complications: Complications of hematoma. Pressure dressing applied. [place] of artery ordered.   [name] Ansley R. 06/04/2013  I used ultrasound to locate and access the vein/artery.

## 2013-06-05 ENCOUNTER — Inpatient Hospital Stay (HOSPITAL_COMMUNITY): Payer: Medicare Other

## 2013-06-05 DIAGNOSIS — Z48812 Encounter for surgical aftercare following surgery on the circulatory system: Secondary | ICD-10-CM

## 2013-06-05 LAB — BASIC METABOLIC PANEL
BUN: 19 mg/dL (ref 6–23)
CO2: 23 mEq/L (ref 19–32)
Calcium: 7 mg/dL — ABNORMAL LOW (ref 8.4–10.5)
Chloride: 102 mEq/L (ref 96–112)
Creatinine, Ser: 1.57 mg/dL — ABNORMAL HIGH (ref 0.50–1.10)
GFR calc Af Amer: 37 mL/min — ABNORMAL LOW (ref >90)
GFR calc non Af Amer: 32 mL/min — ABNORMAL LOW (ref >90)
Glucose, Bld: 110 mg/dL — ABNORMAL HIGH (ref 70–99)
Potassium: 4.1 mEq/L (ref 3.5–5.1)
Sodium: 137 mEq/L (ref 135–145)

## 2013-06-05 LAB — BLOOD GAS, ARTERIAL
Acid-base deficit: 1.4 mmol/L (ref 0.0–2.0)
Bicarbonate: 22.9 mEq/L (ref 20.0–24.0)
Drawn by: 23604
FIO2: 0.6 %
MECHVT: 460 mL
O2 Saturation: 98.1 %
PEEP: 5 cmH2O
Patient temperature: 97.9
RATE: 20 resp/min
TCO2: 24.1 mmol/L (ref 0–100)
pCO2 arterial: 38.3 mmHg (ref 35.0–45.0)
pH, Arterial: 7.392 (ref 7.350–7.450)
pO2, Arterial: 88.8 mmHg (ref 80.0–100.0)

## 2013-06-05 LAB — CBC
HCT: 32.1 % — ABNORMAL LOW (ref 36.0–46.0)
Hemoglobin: 10.8 g/dL — ABNORMAL LOW (ref 12.0–15.0)
MCH: 26.9 pg (ref 26.0–34.0)
MCHC: 33.6 g/dL (ref 30.0–36.0)
MCV: 80 fL (ref 78.0–100.0)
Platelets: 152 10*3/uL (ref 150–400)
RBC: 4.01 MIL/uL (ref 3.87–5.11)
RDW: 16 % — ABNORMAL HIGH (ref 11.5–15.5)
WBC: 26.2 10*3/uL — ABNORMAL HIGH (ref 4.0–10.5)

## 2013-06-05 LAB — RENAL FUNCTION PANEL
Albumin: 1.8 g/dL — ABNORMAL LOW (ref 3.5–5.2)
BUN: 15 mg/dL (ref 6–23)
CO2: 28 mEq/L (ref 19–32)
Calcium: 7 mg/dL — ABNORMAL LOW (ref 8.4–10.5)
Chloride: 101 mEq/L (ref 96–112)
Creatinine, Ser: 1.33 mg/dL — ABNORMAL HIGH (ref 0.50–1.10)
GFR calc Af Amer: 45 mL/min — ABNORMAL LOW (ref >90)
GFR calc non Af Amer: 39 mL/min — ABNORMAL LOW (ref >90)
Glucose, Bld: 101 mg/dL — ABNORMAL HIGH (ref 70–99)
Phosphorus: 1.5 mg/dL — ABNORMAL LOW (ref 2.3–4.6)
Potassium: 3.9 mEq/L (ref 3.5–5.1)
Sodium: 136 mEq/L (ref 135–145)

## 2013-06-05 LAB — GLUCOSE, CAPILLARY
Glucose-Capillary: 105 mg/dL — ABNORMAL HIGH (ref 70–99)
Glucose-Capillary: 56 mg/dL — ABNORMAL LOW (ref 70–99)
Glucose-Capillary: 66 mg/dL — ABNORMAL LOW (ref 70–99)
Glucose-Capillary: 70 mg/dL (ref 70–99)
Glucose-Capillary: 74 mg/dL (ref 70–99)
Glucose-Capillary: 76 mg/dL (ref 70–99)
Glucose-Capillary: 81 mg/dL (ref 70–99)
Glucose-Capillary: 83 mg/dL (ref 70–99)
Glucose-Capillary: 98 mg/dL (ref 70–99)

## 2013-06-05 LAB — URINE CULTURE
Colony Count: NO GROWTH
Culture: NO GROWTH

## 2013-06-05 LAB — TROPONIN I
Troponin I: 5.06 ng/mL (ref <0.30)
Troponin I: 4.31 ng/mL (ref <0.30)

## 2013-06-05 LAB — MAGNESIUM: Magnesium: 1.8 mg/dL (ref 1.5–2.5)

## 2013-06-05 LAB — APTT: aPTT: 47 seconds — ABNORMAL HIGH (ref 24–37)

## 2013-06-05 MED ORDER — METOPROLOL TARTRATE 1 MG/ML IV SOLN
2.5000 mg | INTRAVENOUS | Status: DC | PRN
  Administered 2013-06-05 – 2013-06-06 (×2): 2.5 mg via INTRAVENOUS
  Filled 2013-06-05 (×2): qty 5

## 2013-06-05 MED ORDER — DEXTROSE 50 % IV SOLN
INTRAVENOUS | Status: AC
  Administered 2013-06-05: 50 mL via INTRAVENOUS
  Filled 2013-06-05: qty 50

## 2013-06-05 MED ORDER — DEXTROSE 50 % IV SOLN
INTRAVENOUS | Status: AC
  Filled 2013-06-05: qty 50

## 2013-06-05 MED ORDER — DEXTROSE 50 % IV SOLN: 25.0000 mL | Freq: Once | INTRAVENOUS | Status: AC | PRN

## 2013-06-05 NOTE — Progress Notes (Signed)
Subjective: Pt seen in ICU, remains intubated. S/p rt PCN placement for hydronephrosis  Objective: Physical Exam: BP 86/54  Pulse 101  Temp(Src) 97.4 F (36.3 C) (Core (Comment))  Resp 24  Ht 5\' 5"  (1.651 m)  Wt 231 lb 0.7 oz (104.8 kg)  BMI 38.45 kg/m2  SpO2 100% (R) PCN intact, site clean, NT Output bloody, few clots in bag but 350cc total output since placement. WBC down, Cr down  Labs: CBC  Recent Labs  06/04/13 1234 06/05/13 0420  WBC 32.9* 26.2*  HGB 12.7 10.8*  HCT 40.8 32.1*  PLT 273 152   BMET  Recent Labs  06/04/13 1600 06/05/13 0420  NA 137 137  K 4.5 4.1  CL 107 102  CO2 14* 23  GLUCOSE 105* 110*  BUN 34* 19  CREATININE 2.58* 1.57*  CALCIUM 6.4* 7.0*   LFT  Recent Labs  06/04/13 1234 06/04/13 1600  PROT 5.8*  --   ALBUMIN 2.4* 2.0*  AST 74*  --   ALT 30  --   ALKPHOS 63  --   BILITOT 0.6  --    PT/INR  Recent Labs  06/04/13 1325  LABPROT 18.9*  INR 1.63*     Studies/Results: Ir Perc Nephrostomy Right  06/05/2013   *RADIOLOGY REPORT*   Clinical data: Clinical evidence of sepsis requiring intubation after attempted cystoscopic right renal decompression and stenting.  RIGHT PERCUTANEOUS NEPHROSTOMY CATHETER PLACEMENT UNDER ULTRASOUND AND FLUOROSCOPIC GUIDANCE  Technique: The procedure, risks (including but not limited to bleeding, infection, organ damage.  ), benefits, and alternatives were explained to the daughter, as the patient was intubated and unable to provide consent.  Questions regarding the procedure were encouraged and answered.  The daughter understands and consents to the procedure. The rightflank region prepped with Betadine, draped in usual sterile fashion, infiltrated locally with 1% lidocaine.The patient was already receiving adequate antibiotic coverage.  Intravenous Fentanyl and Versed were administered as conscious sedation during continuous cardiorespiratory monitoring by the radiology RN, with a total moderate  sedation time of 20 minutes.   Under real-time ultrasound guidance, a 21-gauge trocar needle was advanced into a posterior lower pole calyx. Ultrasound image documentation was saved. Urine spontaneously returned through the needle. Needle was exchanged over a guidewire for transitional dilator. Contrast injection confirmed appropriate positioning. Catheter was exchanged over a guidewire for a 10 French pigtail catheter, formed centrally within the right renal collecting system. Contrast injection confirms appropriate positioning and patency. Catheter   secured externally with 0 Prolene suture and placed to external drain bag. A 20 ml aspirate of the cloudy urine returned through the catheter was sent for Gram stain, culture and sensitivity.  No immediate complication.  Fluoroscopy time: 19seconds  IMPRESSION Technically successful right percutaneous nephrostomy catheter placement.   Original Report Authenticated By: D. Wallace Going, MD   Ir US Guide Bx Asp/drain  06/05/2013   *RADIOLOGY REPORT*   Clinical data: Clinical evidence of sepsis requiring intubation after attempted cystoscopic right renal decompression and stenting.  RIGHT PERCUTANEOUS NEPHROSTOMY CATHETER PLACEMENT UNDER ULTRASOUND AND FLUOROSCOPIC GUIDANCE  Technique: The procedure, risks (including but not limited to bleeding, infection, organ damage.  ), benefits, and alternatives were explained to the daughter, as the patient was intubated and unable to provide consent.  Questions regarding the procedure were encouraged and answered.  The daughter understands and consents to the procedure. The rightflank region prepped with Betadine, draped in usual sterile fashion, infiltrated locally with 1% lidocaine.The patient was already receiving adequate  antibiotic coverage.  Intravenous Fentanyl and Versed were administered as conscious sedation during continuous cardiorespiratory monitoring by the radiology RN, with a total moderate sedation time of 20  minutes.   Under real-time ultrasound guidance, a 21-gauge trocar needle was advanced into a posterior lower pole calyx. Ultrasound image documentation was saved. Urine spontaneously returned through the needle. Needle was exchanged over a guidewire for transitional dilator. Contrast injection confirmed appropriate positioning. Catheter was exchanged over a guidewire for a 10 French pigtail catheter, formed centrally within the right renal collecting system. Contrast injection confirms appropriate positioning and patency. Catheter   secured externally with 0 Prolene suture and placed to external drain bag. A 20 ml aspirate of the cloudy urine returned through the catheter was sent for Gram stain, culture and sensitivity.  No immediate complication.  Fluoroscopy time: 19seconds  IMPRESSION Technically successful right percutaneous nephrostomy catheter placement.   Original Report Authenticated By: D. Wallace Going, MD   Dg Chest Port 1 View  06/05/2013   *RADIOLOGY REPORT*  Clinical Data: Respiratory failure  PORTABLE CHEST - 1 VIEW  Comparison: 06/04/2013  Findings: Endotracheal tube is approximately 6 cm above the carina. Right jugular catheter tip in the SVC.  Left jugular catheter tip in the mid SVC.  Negative for pneumothorax.  Left lower lobe atelectasis and effusion unchanged.  Negative for pulmonary edema.  IMPRESSION: No significant change.  Left lower lobe consolidation and left effusion are stable.   Original Report Authenticated By: Carl Best, M.D.   Dg Chest Port 1 View  06/04/2013   *RADIOLOGY REPORT*  Clinical Data: Sepsis, renal failure.  PORTABLE CHEST - 1 VIEW  Comparison: Earlier film of the same day  Findings: Endotracheal tube, nasogastric tube, and left IJ central line are stable in position.  New right IJ dialysis catheter extends to the proximal SVC.  No pneumothorax.  Persistent left lower lung consolidation / atelectasis with adjacent effusion. Patchy perihilar interstitial infiltrates  bilaterally as before.  IMPRESSION:  1.  Right IJ dialysis catheter placement to the proximal SVC, no pneumothorax.   Original Report Authenticated By: D. Wallace Going, MD   Dg Chest Port 1 View  06/04/2013   *RADIOLOGY REPORT*  Clinical Data: Endotracheal tube placement  PORTABLE CHEST - 1 VIEW  Comparison: 06/04/2013  Findings: Endotracheal tube is 2.5 cm above the carina.  Left jugular catheter tip in the SVC unchanged.  No pneumothorax.  NG tube enters the stomach.  Bibasilar atelectasis again noted.  Vascular congestion and mild edema appears improved.  IMPRESSION: Endotracheal tube remains in good position.  Bibasilar atelectasis unchanged.  Improvement in pulmonary edema.   Original Report Authenticated By: Carl Best, M.D.    Assessment/Plan: S/p (R) PCN Cont flushes three times daily to ensure proper drain function.     LOS: 1 day    Ascencion Dike PA-C 06/05/2013 9:48 AM

## 2013-06-05 NOTE — Progress Notes (Signed)
VASCULAR LAB PRELIMINARY  PRELIMINARY  PRELIMINARY  PRELIMINARY  Ultrasound of right groin completed.    Preliminary report:  There is no pseudoaneurysm or AV fistula noted in the right groin.  Britney Newstrom, RVT 06/05/2013, 4:27 PM

## 2013-06-05 NOTE — Progress Notes (Signed)
Patient ID: [name] [name], female   DOB: Jul 14, 1941, 72 y.o.   MRN: [id number]    Subjective: [name] [name] is doing better today but remains essentially anuric.   She had the right perc tube placed yesterday and her hemodynamic status is improving.  She is awake and responsive on the vent.    ROS: Negative except on vent and unable to respond.    Objective: Vital signs in last 24 hours: Temp:  [94.9 F (34.9 C)-99.7 F (37.6 C)] 97.4 F (36.3 C) (08/10 0700) Pulse Rate:  [25-125] 101 (08/10 0740) Resp:  [14-28] 24 (08/10 0740) BP: (63-183)/(31-166) 86/54 mmHg (08/10 0740) SpO2:  [75 %-100 %] 100 % (08/10 0740) Arterial Line BP: (101-138)/(48-76) 105/49 mmHg (08/10 0500) FiO2 (%):  [50 %-100 %] 50 % (08/10 0740) Weight:  [103.8 kg (228 lb 13.4 oz)-104.8 kg (231 lb 0.7 oz)] 104.8 kg (231 lb 0.7 oz) (08/10 0500)  Intake/Output from previous day: 08/09 0701 - 08/10 0700 In: 3150.2 [I.V.:2872.7; IV Piggyback:272.5] Out: 4782 [Urine:615] Intake/Output this shift: Total I/O In: -  Out: 133 [Other:133]  General appearance: Awake and responsive. There is just blood in the right NT bag.   The urine is clear in the foley bag but she is only making 2-5cc q2hrs.   Lab Results:   Recent Labs  06/04/13 1234 06/05/13 0420  WBC 32.9* 26.2*  HGB 12.7 10.8*  HCT 40.8 32.1*  PLT 273 152   BMET  Recent Labs  06/04/13 1600 06/05/13 0420  NA 137 137  K 4.5 4.1  CL 107 102  CO2 14* 23  GLUCOSE 105* 110*  BUN 34* 19  CREATININE 2.58* 1.57*  CALCIUM 6.4* 7.0*   PT/INR  Recent Labs  06/04/13 1325  LABPROT 18.9*  INR 1.63*   ABG  Recent Labs  06/04/13 2348 06/05/13 0425  PHART 7.298* 7.392  HCO3 17.0* 22.9    Studies/Results: Ir Perc Nephrostomy Right  06/05/2013   *RADIOLOGY REPORT*   Clinical data: Clinical evidence of sepsis requiring intubation after attempted cystoscopic right renal decompression and stenting.  RIGHT PERCUTANEOUS NEPHROSTOMY CATHETER PLACEMENT  UNDER ULTRASOUND AND FLUOROSCOPIC GUIDANCE  Technique: The procedure, risks (including but not limited to bleeding, infection, organ damage.  ), benefits, and alternatives were explained to the daughter, as the patient was intubated and unable to provide consent.  Questions regarding the procedure were encouraged and answered.  The daughter understands and consents to the procedure. The rightflank region prepped with Betadine, draped in usual sterile fashion, infiltrated locally with 1% lidocaine.The patient was already receiving adequate antibiotic coverage.  Intravenous Fentanyl and Versed were administered as conscious sedation during continuous cardiorespiratory monitoring by the radiology RN, with a total moderate sedation time of 20 minutes.   Under real-time ultrasound guidance, a 21-gauge trocar needle was advanced into a posterior lower pole calyx. Ultrasound image documentation was saved. Urine spontaneously returned through the needle. Needle was exchanged over a guidewire for transitional dilator. Contrast injection confirmed appropriate positioning. Catheter was exchanged over a guidewire for a 10 French pigtail catheter, formed centrally within the right renal collecting system. Contrast injection confirms appropriate positioning and patency. Catheter   secured externally with 0 Prolene suture and placed to external drain bag. A 20 ml aspirate of the cloudy urine returned through the catheter was sent for Gram stain, culture and sensitivity.  No immediate complication.  Fluoroscopy time: 19seconds  IMPRESSION Technically successful right percutaneous nephrostomy catheter placement.   Original Report Authenticated By: D.  Wallace Going, MD   Ir US Guide Bx Asp/drain  06/05/2013   *RADIOLOGY REPORT*   Clinical data: Clinical evidence of sepsis requiring intubation after attempted cystoscopic right renal decompression and stenting.  RIGHT PERCUTANEOUS NEPHROSTOMY CATHETER PLACEMENT UNDER ULTRASOUND AND  FLUOROSCOPIC GUIDANCE  Technique: The procedure, risks (including but not limited to bleeding, infection, organ damage.  ), benefits, and alternatives were explained to the daughter, as the patient was intubated and unable to provide consent.  Questions regarding the procedure were encouraged and answered.  The daughter understands and consents to the procedure. The rightflank region prepped with Betadine, draped in usual sterile fashion, infiltrated locally with 1% lidocaine.The patient was already receiving adequate antibiotic coverage.  Intravenous Fentanyl and Versed were administered as conscious sedation during continuous cardiorespiratory monitoring by the radiology RN, with a total moderate sedation time of 20 minutes.   Under real-time ultrasound guidance, a 21-gauge trocar needle was advanced into a posterior lower pole calyx. Ultrasound image documentation was saved. Urine spontaneously returned through the needle. Needle was exchanged over a guidewire for transitional dilator. Contrast injection confirmed appropriate positioning. Catheter was exchanged over a guidewire for a 10 French pigtail catheter, formed centrally within the right renal collecting system. Contrast injection confirms appropriate positioning and patency. Catheter   secured externally with 0 Prolene suture and placed to external drain bag. A 20 ml aspirate of the cloudy urine returned through the catheter was sent for Gram stain, culture and sensitivity.  No immediate complication.  Fluoroscopy time: 19seconds  IMPRESSION Technically successful right percutaneous nephrostomy catheter placement.   Original Report Authenticated By: D. Wallace Going, MD   Dg Chest Port 1 View  06/05/2013   *RADIOLOGY REPORT*  Clinical Data: Respiratory failure  PORTABLE CHEST - 1 VIEW  Comparison: 06/04/2013  Findings: Endotracheal tube is approximately 6 cm above the carina. Right jugular catheter tip in the SVC.  Left jugular catheter tip in the mid  SVC.  Negative for pneumothorax.  Left lower lobe atelectasis and effusion unchanged.  Negative for pulmonary edema.  IMPRESSION: No significant change.  Left lower lobe consolidation and left effusion are stable.   Original Report Authenticated By: Carl Best, M.D.   Dg Chest Port 1 View  06/04/2013   *RADIOLOGY REPORT*  Clinical Data: Sepsis, renal failure.  PORTABLE CHEST - 1 VIEW  Comparison: Earlier film of the same day  Findings: Endotracheal tube, nasogastric tube, and left IJ central line are stable in position.  New right IJ dialysis catheter extends to the proximal SVC.  No pneumothorax.  Persistent left lower lung consolidation / atelectasis with adjacent effusion. Patchy perihilar interstitial infiltrates bilaterally as before.  IMPRESSION:  1.  Right IJ dialysis catheter placement to the proximal SVC, no pneumothorax.   Original Report Authenticated By: D. Wallace Going, MD   Dg Chest Port 1 View  06/04/2013   *RADIOLOGY REPORT*  Clinical Data: Endotracheal tube placement  PORTABLE CHEST - 1 VIEW  Comparison: 06/04/2013  Findings: Endotracheal tube is 2.5 cm above the carina.  Left jugular catheter tip in the SVC unchanged.  No pneumothorax.  NG tube enters the stomach.  Bibasilar atelectasis again noted.  Vascular congestion and mild edema appears improved.  IMPRESSION: Endotracheal tube remains in good position.  Bibasilar atelectasis unchanged.  Improvement in pulmonary edema.   Original Report Authenticated By: Carl Best, M.D.    Anti-infectives: Anti-infectives   Start     Dose/Rate Route Frequency Ordered Stop   06/04/13 1500  piperacillin-tazobactam (  ZOSYN) IVPB 3.375 g     3.375 g 100 mL/hr over 30 Minutes Intravenous Every 6 hours 06/04/13 1441     06/04/13 1400  piperacillin-tazobactam (ZOSYN) IVPB 2.25 g  Status:  Discontinued     2.25 g 100 mL/hr over 30 Minutes Intravenous 3 times per day 06/04/13 1333 06/04/13 1441      Current Facility-Administered Medications   Medication Dose Route Frequency Provider Last Rate Last Dose  . 0.9 %  sodium chloride infusion  250 mL Intravenous PRN Marijean Heath, NP      . antiseptic oral rinse (BIOTENE) solution 15 mL  15 mL Mouth Rinse QID Collene Gobble, MD   15 mL at 06/05/13 0400  . chlorhexidine (PERIDEX) 0.12 % solution 15 mL  15 mL Mouth Rinse BID Collene Gobble, MD   15 mL at 06/04/13 1945  . fentaNYL (SUBLIMAZE) injection 25-50 mcg  25-50 mcg Intravenous Q2H PRN Marijean Heath, NP   50 mcg at 06/04/13 2252  . heparin injection 1,000-6,000 Units  1,000-6,000 Units CRRT PRN Rexene Agent, MD   3,000 Units at 06/04/13 1443  . heparin injection 5,000 Units  5,000 Units Subcutaneous Q8H Marijean Heath, NP   5,000 Units at 06/05/13 0630  . hydrocortisone sodium succinate (SOLU-CORTEF) 100 mg/2 mL injection 50 mg  50 mg Intravenous Q6H Rigoberto Noel, MD   50 mg at 06/05/13 0631  . midazolam (VERSED) injection 1-2 mg  1-2 mg Intravenous Q2H PRN Marijean Heath, NP   2 mg at 06/05/13 0149  . norepinephrine (LEVOPHED) 16 mg in dextrose 5 % 250 mL infusion  2-50 mcg/min Intravenous Titrated Rocky Crafts Hammons, RPH   2 mcg/min at 06/05/13 [id number]  . pantoprazole (PROTONIX) injection 40 mg  40 mg Intravenous QHS Marijean Heath, NP   40 mg at 06/04/13 2224  . piperacillin-tazobactam (ZOSYN) IVPB 3.375 g  3.375 g Intravenous Q6H Norva Riffle, RPH   3.375 g at 06/05/13 0947  . prismasol BGK 4/2.5 5,000 mL dialysis replacement fluid   CRRT Continuous Rexene Agent, MD 1,000 mL/hr at 06/05/13 [phone]    . prismasol BGK 4/2.5 5,000 mL dialysis replacement fluid   CRRT Continuous Rexene Agent, MD 1,500 mL/hr at 06/05/13 [phone]    . prismasol BGK 4/2.5 5,000 mL dialysis solution   CRRT Continuous Rexene Agent, MD 2,000 mL/hr at 06/05/13 0555    . sodium bicarbonate 150 mEq in dextrose 5 % 1,000 mL infusion   Intravenous Continuous Guy Begin, MD 125 mL/hr at 06/05/13 [phone]    . vasopressin  (PITRESSIN) 50 Units in sodium chloride 0.9 % 250 mL infusion  0.03 Units/min Intravenous Continuous Rigoberto Noel, MD   0.03 Units/min at 06/04/13 2225    Assessment: Improving post NT placement but she is anuric and requiring dialysis.   Plan: Her husband states that she tolerates stents poorly and I think the best option at this time would be to maintain nephrostomy tube drainage and not internalize a stent.   She will probably need consideration of a ureteral reimplantation if she has persistent obstruction on a nephrostogram which should be done when recovered from the sepsis.     LOS: 1 day    Rosie Golson J 06/05/2013

## 2013-06-05 NOTE — Progress Notes (Signed)
PULMONARY  / CRITICAL CARE MEDICINE  Name: [name] [name] MRN: [id number] DOB: Mar 28, 1941    ADMISSION DATE:  06/04/2013  REFERRING MD :  [facility]  PRIMARY SERVICE: PCCM   CHIEF COMPLAINT:  Shock, renal failure   BRIEF PATIENT DESCRIPTION: 72 yo female with hx kidney stones, frequent UTI, HTN tx 8/9 from [facility] with septic shock, acute renal failure and R pyelonephritis after failed ureteral stent placement 8/7.  S/p R perc nephrostomy 8/9. CVVHD.   SIGNIFICANT EVENTS / STUDIES:  CT abd/pelvis 8/9>>> mod to severe R hydronephrosis with R sided perinephric stranding and fluid.  Dilation of the ureter to the L of the R hemipelvis, without evidence of a distal obstructing stone.  Pyelonephritis cannot be excluded.  Left renal cyst.   LINES / TUBES: L IJ CVL 8/9>>> ETT 8/9>>> R IJ HD cath 8/9 >>   CULTURES: Blood 8/9 [facility]) >> GNR >>  Urine 8/9>>> BCx2 8/9>>> R nephrostomy drainage 8/9 >>   ANTIBIOTICS: Linezolid x1 8/9 Imipenem x1 8/9 Zosyn 8/9 >>  Interval events:  R nephrostomy placed by Dr Levada Schilling weaned to off CVVHD tolerated, no heparin  VITAL SIGNS: Temp:  [94.9 F (34.9 C)-99.7 F (37.6 C)] 97.4 F (36.3 C) (08/10 0700) Pulse Rate:  [25-125] 101 (08/10 0740) Resp:  [14-28] 24 (08/10 0740) BP: (63-183)/(31-166) 86/54 mmHg (08/10 0740) SpO2:  [75 %-100 %] 100 % (08/10 0740) Arterial Line BP: (101-138)/(48-76) 105/49 mmHg (08/10 0500) FiO2 (%):  [50 %-100 %] 50 % (08/10 0740) Weight:  [103.8 kg (228 lb 13.4 oz)-104.8 kg (231 lb 0.7 oz)] 104.8 kg (231 lb 0.7 oz) (08/10 0500) HEMODYNAMICS: CVP:  [10 mmHg-16 mmHg] 10 mmHg VENTILATOR SETTINGS: Vent Mode:  [-] PRVC FiO2 (%):  [50 %-100 %] 50 % Set Rate:  [14 bmp-20 bmp] 20 bmp Vt Set:  [460 mL-550 mL] 460 mL PEEP:  [5 cmH20] 5 cmH20 Plateau Pressure:  [17 cmH20-24 cmH20] 18 cmH20 INTAKE / OUTPUT: Intake/Output     08/09 0701 - 08/10 0700 08/10 0701 - 08/11 0700   I.V. (mL/kg)  2872.7 (27.4)    Other 5    IV Piggyback 272.5    Total Intake(mL/kg) 3150.2 (30.1)    Urine (mL/kg/hr) 615    Other 4327 430 (1)   Total Output 4942 430   Net -1791.8 -430        Stool Occurrence 2 x      PHYSICAL EXAMINATION: General:  Obese female, critically ill  Neuro:  Sedated, withdraws to pain, RASS -3 HEENT:  Mm dry, no JVD Cardiovascular:  s1s2 tachy, distant  Lungs:  resps even, non labored on vent, coarse  Abdomen:  Obese, soft, non tender Ext: cool, clammy, knees mottled, 1+ BLE edema   LABS:  CBC Recent Labs     06/04/13  1234  06/05/13  0420  WBC  32.9*  26.2*  HGB  12.7  10.8*  HCT  40.8  32.1*  PLT  273  152   Coag's Recent Labs     06/04/13  1325  06/05/13  0420  APTT   --   47*  INR  1.63*   --    BMET Recent Labs     06/04/13  1234  06/04/13  1600  06/05/13  0420  NA  137  137  137  K  4.9  4.5  4.1  CL  110  107  102  CO2  11*  14*  23  BUN  40*  34*  19  CREATININE  3.22*  2.58*  1.57*  GLUCOSE  128*  105*  110*   Electrolytes Recent Labs     06/04/13  1234  06/04/13  1600  06/05/13  0420  CALCIUM  6.8*  6.4*  7.0*  MG  1.4*   --   1.8  PHOS  5.1*  3.9   --    Sepsis Markers Recent Labs     06/04/13  1235  PROCALCITON  >175.00   ABG Recent Labs     06/04/13  1712  06/04/13  2348  06/05/13  0425  PHART  7.153*  7.298*  7.392  PCO2ART  38.1  34.9*  38.3  PO2ART  107.0*  168.0*  88.8   Liver Enzymes Recent Labs     06/04/13  1234  06/04/13  1600  AST  74*   --   ALT  30   --   ALKPHOS  63   --   BILITOT  0.6   --   ALBUMIN  2.4*  2.0*   Cardiac Enzymes Recent Labs     06/04/13  1235  06/04/13  1835  06/05/13  0035  TROPONINI  2.44*  3.89*  5.06*  PROBNP  22349.0*   --    --    Glucose Recent Labs     06/04/13  1510  06/04/13  1940  06/04/13  2343  06/05/13  0027  06/05/13  0358  06/05/13  0715  GLUCAP  95  74  66*  70  74  76    Imaging Ir Perc Nephrostomy Right  06/05/2013    *RADIOLOGY REPORT*   Clinical data: Clinical evidence of sepsis requiring intubation after attempted cystoscopic right renal decompression and stenting.  RIGHT PERCUTANEOUS NEPHROSTOMY CATHETER PLACEMENT UNDER ULTRASOUND AND FLUOROSCOPIC GUIDANCE  Technique: The procedure, risks (including but not limited to bleeding, infection, organ damage.  ), benefits, and alternatives were explained to the daughter, as the patient was intubated and unable to provide consent.  Questions regarding the procedure were encouraged and answered.  The daughter understands and consents to the procedure. The rightflank region prepped with Betadine, draped in usual sterile fashion, infiltrated locally with 1% lidocaine.The patient was already receiving adequate antibiotic coverage.  Intravenous Fentanyl and Versed were administered as conscious sedation during continuous cardiorespiratory monitoring by the radiology RN, with a total moderate sedation time of 20 minutes.   Under real-time ultrasound guidance, a 21-gauge trocar needle was advanced into a posterior lower pole calyx. Ultrasound image documentation was saved. Urine spontaneously returned through the needle. Needle was exchanged over a guidewire for transitional dilator. Contrast injection confirmed appropriate positioning. Catheter was exchanged over a guidewire for a 10 French pigtail catheter, formed centrally within the right renal collecting system. Contrast injection confirms appropriate positioning and patency. Catheter   secured externally with 0 Prolene suture and placed to external drain bag. A 20 ml aspirate of the cloudy urine returned through the catheter was sent for Gram stain, culture and sensitivity.  No immediate complication.  Fluoroscopy time: 19seconds  IMPRESSION Technically successful right percutaneous nephrostomy catheter placement.   Original Report Authenticated By: D. Wallace Going, MD   Ir US Guide Bx Asp/drain  06/05/2013   *RADIOLOGY REPORT*    Clinical data: Clinical evidence of sepsis requiring intubation after attempted cystoscopic right renal decompression and stenting.  RIGHT PERCUTANEOUS NEPHROSTOMY CATHETER PLACEMENT UNDER ULTRASOUND AND FLUOROSCOPIC GUIDANCE  Technique: The procedure, risks (  including but not limited to bleeding, infection, organ damage.  ), benefits, and alternatives were explained to the daughter, as the patient was intubated and unable to provide consent.  Questions regarding the procedure were encouraged and answered.  The daughter understands and consents to the procedure. The rightflank region prepped with Betadine, draped in usual sterile fashion, infiltrated locally with 1% lidocaine.The patient was already receiving adequate antibiotic coverage.  Intravenous Fentanyl and Versed were administered as conscious sedation during continuous cardiorespiratory monitoring by the radiology RN, with a total moderate sedation time of 20 minutes.   Under real-time ultrasound guidance, a 21-gauge trocar needle was advanced into a posterior lower pole calyx. Ultrasound image documentation was saved. Urine spontaneously returned through the needle. Needle was exchanged over a guidewire for transitional dilator. Contrast injection confirmed appropriate positioning. Catheter was exchanged over a guidewire for a 10 French pigtail catheter, formed centrally within the right renal collecting system. Contrast injection confirms appropriate positioning and patency. Catheter   secured externally with 0 Prolene suture and placed to external drain bag. A 20 ml aspirate of the cloudy urine returned through the catheter was sent for Gram stain, culture and sensitivity.  No immediate complication.  Fluoroscopy time: 19seconds  IMPRESSION Technically successful right percutaneous nephrostomy catheter placement.   Original Report Authenticated By: D. Wallace Going, MD   Dg Chest Port 1 View  06/05/2013   *RADIOLOGY REPORT*  Clinical Data: Respiratory  failure  PORTABLE CHEST - 1 VIEW  Comparison: 06/04/2013  Findings: Endotracheal tube is approximately 6 cm above the carina. Right jugular catheter tip in the SVC.  Left jugular catheter tip in the mid SVC.  Negative for pneumothorax.  Left lower lobe atelectasis and effusion unchanged.  Negative for pulmonary edema.  IMPRESSION: No significant change.  Left lower lobe consolidation and left effusion are stable.   Original Report Authenticated By: Carl Best, M.D.   Dg Chest Port 1 View  06/04/2013   *RADIOLOGY REPORT*  Clinical Data: Sepsis, renal failure.  PORTABLE CHEST - 1 VIEW  Comparison: Earlier film of the same day  Findings: Endotracheal tube, nasogastric tube, and left IJ central line are stable in position.  New right IJ dialysis catheter extends to the proximal SVC.  No pneumothorax.  Persistent left lower lung consolidation / atelectasis with adjacent effusion. Patchy perihilar interstitial infiltrates bilaterally as before.  IMPRESSION:  1.  Right IJ dialysis catheter placement to the proximal SVC, no pneumothorax.   Original Report Authenticated By: D. Wallace Going, MD   Dg Chest Port 1 View  06/04/2013   *RADIOLOGY REPORT*  Clinical Data: Endotracheal tube placement  PORTABLE CHEST - 1 VIEW  Comparison: 06/04/2013  Findings: Endotracheal tube is 2.5 cm above the carina.  Left jugular catheter tip in the SVC unchanged.  No pneumothorax.  NG tube enters the stomach.  Bibasilar atelectasis again noted.  Vascular congestion and mild edema appears improved.  IMPRESSION: Endotracheal tube remains in good position.  Bibasilar atelectasis unchanged.  Improvement in pulmonary edema.   Original Report Authenticated By: Carl Best, M.D.     CXR:  Pending   ASSESSMENT / PLAN:  PULMONARY Acute respiratory failure - in setting septic shock  Possible LLL PNA P:   Vent support - ABG compensated on current settings F/u CXR   CARDIOVASCULAR Elevated troponin - likely demand ischemia Type 2 NSTEMI  + ARF Septic shock  P:  Trend troponin, recheck today Volume based on CVP goal 10-12 D/c hydrocortisone  RENAL Acute  renal failure  R hydronephrosis  Pyelonephritis  R Ureteral stricture - failed ureteral stent placement 8/7 UTI P:   abx as above  Appreciate Dr Ralene Muskrat, Dr Adron Bene and Dr Lynder Parents assistance  S/p perc nephrostomy Continue CVVHD for another 24h  GASTROINTESTINAL No active issue  P:   Start TF 8/10  HEMATOLOGIC No active issue  P:  Cbc  Sub Q heparin   INFECTIOUS R Pyelonephritis GNR bacteremia (cx from [place]) P:   Abx, micro as above  Pan culture pending  ENDOCRINE Hyperglycemia - not known DM   P:   SSI   NEUROLOGIC AMS - in setting sepsis P:   Intermittent sedation while on vent    I have personally obtained a history, examined the patient, evaluated laboratory and imaging results, formulated the assessment and plan and placed orders. CRITICAL CARE: The patient is critically ill with multiple organ systems failure and requires high complexity decision making for assessment and support, frequent evaluation and titration of therapies, application of advanced monitoring technologies and extensive interpretation of multiple databases. Critical Care Time devoted to patient care services described in this note is 50 minutes.    *Care during the described time interval was provided by me and/or other providers on the critical care team. I have reviewed this patient's available data, including medical history, events of note, physical examination and test results as part of my evaluation.   Baltazar Apo, MD, PhD 06/05/2013, 10:59 AM [facility] [phone] or if no answer [phone]

## 2013-06-05 NOTE — Significant Event (Signed)
Tachycardia with baseline artifact making it impossible to discern underlying rhythm  EKG PRN metoprolol to maintain HR < 115/min Keep I/Os even on CRRT

## 2013-06-05 NOTE — Progress Notes (Signed)
Hypoglycemic Event  CBG: 56  Treatment: D50 IV 50 mL  Symptoms: None  Follow-up CBG: [id number] CBG Result:105  Possible Reasons for Event: Inadequate meal intake  Comments/MD notified:Dr. Zubelevitskiy    Linna Darner, Carren Rang  Remember to initiate Hypoglycemia Order Set & complete

## 2013-06-05 NOTE — Progress Notes (Signed)
Patient aline not reading. RT and nurse at bedside assessing. No blood return. MD Lincoln Maxin made aware. Aline removed and pressure dressing applied.  Lamar Sprinkles

## 2013-06-05 NOTE — Procedures (Signed)
Admit: 06/04/2013 LOS: 1  67F with R sided ureteral obstruction and likely obstructive pyelonephritis, GNR bacteremia, septic shock, hypoxic/hypercapneic RF, and AKI 2/2 ATN requiring CRRT  Current CRRT Prescription: Start Date: 06/04/13 Catheter: R IJ, non tunneled, placed 06/04/13 BFR: 243mL/hr Pre Blood Pump: 1552mL/hr --> 1000 mL/hr today DFR: 2000 Replacement Rate: 1500 mL/hr --> 1078mL/hr today Goal UF: 23mL/hr Anticoagulation: none Clotting: clotting off now for first time  S: R percutaneous nephrostomy placed yesterday with cloudy fluid drained [place] UOP overnight No pressor requirement this AM FIO2 down to 60% Remains on Zosyn Acidosis normalized on CRRT and inc minute ventilation Pt alert, responsive  O: 08/09 0701 - 08/10 0700 In: 3150.2 [I.V.:2872.7; IV Piggyback:272.5] Out: 4782 [Urine:615]  Filed Weights   06/04/13 1620 06/05/13 0500  Weight: 103.8 kg (228 lb 13.4 oz) 104.8 kg (231 lb 0.7 oz)    Recent Labs Lab 06/04/13 1234 06/04/13 1600 06/05/13 0420  NA 137 137 137  K 4.9 4.5 4.1  CL 110 107 102  CO2 11* 14* 23  GLUCOSE 128* 105* 110*  BUN 40* 34* 19  CREATININE 3.22* 2.58* 1.57*  CALCIUM 6.8* 6.4* 7.0*  PHOS 5.1* 3.9  --     Recent Labs Lab 06/04/13 1234 06/05/13 0420  WBC 32.9* 26.2*  HGB 12.7 10.8*  HCT 40.8 32.1*  MCV 85.0 80.0  PLT 273 152    ABG    Component Value Date/Time   PHART 7.392 06/05/2013 0425   PCO2ART 38.3 06/05/2013 0425   PO2ART 88.8 06/05/2013 0425   HCO3 22.9 06/05/2013 0425   TCO2 24.1 06/05/2013 0425   ACIDBASEDEF 1.4 06/05/2013 0425   O2SAT 98.1 06/05/2013 0425    Plan:  1. Dialysis dependent AKI: Pt's acidosis improved on high dose of CRRT.  Source control achieved and now off pressors.  Will reduce PBP and Rep Rate today to 1047mL/hr each.  Liberalize fluid removal.  Remains largely anuric.  Will cont on CRRT for another 24h and then see if can tolerate trial off with iHD as needed.  No excessive clotting, will  hold on anticoagulation for now.

## 2013-06-06 ENCOUNTER — Encounter (HOSPITAL_COMMUNITY): Payer: Self-pay | Admitting: *Deleted

## 2013-06-06 ENCOUNTER — Inpatient Hospital Stay (HOSPITAL_COMMUNITY): Payer: Medicare Other

## 2013-06-06 LAB — RENAL FUNCTION PANEL
Albumin: 1.8 g/dL — ABNORMAL LOW (ref 3.5–5.2)
BUN: 10 mg/dL (ref 6–23)
CO2: 30 mEq/L (ref 19–32)
Calcium: 7.2 mg/dL — ABNORMAL LOW (ref 8.4–10.5)
Chloride: 101 mEq/L (ref 96–112)
Creatinine, Ser: 1.01 mg/dL (ref 0.50–1.10)
GFR calc Af Amer: 63 mL/min — ABNORMAL LOW (ref >90)
GFR calc non Af Amer: 55 mL/min — ABNORMAL LOW (ref >90)
Glucose, Bld: 129 mg/dL — ABNORMAL HIGH (ref 70–99)
Phosphorus: 1 mg/dL — CL (ref 2.3–4.6)
Potassium: 3.5 mEq/L (ref 3.5–5.1)
Sodium: 138 mEq/L (ref 135–145)

## 2013-06-06 LAB — GLUCOSE, CAPILLARY
Glucose-Capillary: 118 mg/dL — ABNORMAL HIGH (ref 70–99)
Glucose-Capillary: 119 mg/dL — ABNORMAL HIGH (ref 70–99)
Glucose-Capillary: 120 mg/dL — ABNORMAL HIGH (ref 70–99)
Glucose-Capillary: 123 mg/dL — ABNORMAL HIGH (ref 70–99)
Glucose-Capillary: 56 mg/dL — ABNORMAL LOW (ref 70–99)
Glucose-Capillary: 65 mg/dL — ABNORMAL LOW (ref 70–99)
Glucose-Capillary: 86 mg/dL (ref 70–99)
Glucose-Capillary: 97 mg/dL (ref 70–99)

## 2013-06-06 LAB — BODY FLUID CULTURE

## 2013-06-06 LAB — CBC
HCT: 27.7 % — ABNORMAL LOW (ref 36.0–46.0)
Hemoglobin: 9.4 g/dL — ABNORMAL LOW (ref 12.0–15.0)
MCH: 26.9 pg (ref 26.0–34.0)
MCHC: 33.9 g/dL (ref 30.0–36.0)
MCV: 79.4 fL (ref 78.0–100.0)
Platelets: 106 10*3/uL — ABNORMAL LOW (ref 150–400)
RBC: 3.49 MIL/uL — ABNORMAL LOW (ref 3.87–5.11)
RDW: 15.8 % — ABNORMAL HIGH (ref 11.5–15.5)
WBC: 17.8 10*3/uL — ABNORMAL HIGH (ref 4.0–10.5)

## 2013-06-06 LAB — TROPONIN I
Troponin I: 4.14 ng/mL (ref <0.30)
Troponin I: 3.58 ng/mL (ref <0.30)

## 2013-06-06 LAB — TSH: TSH: 1.876 u[IU]/mL (ref 0.350–4.500)

## 2013-06-06 LAB — MAGNESIUM: Magnesium: 2.2 mg/dL (ref 1.5–2.5)

## 2013-06-06 LAB — APTT: aPTT: 40 seconds — ABNORMAL HIGH (ref 24–37)

## 2013-06-06 MED ORDER — AMIODARONE HCL IN DEXTROSE 360-4.14 MG/200ML-% IV SOLN
60.0000 mg/h | INTRAVENOUS | Status: AC
  Administered 2013-06-06 (×2): 60 mg/h via INTRAVENOUS
  Filled 2013-06-06 (×2): qty 200

## 2013-06-06 MED ORDER — SODIUM PHOSPHATE 3 MMOLE/ML IV SOLN
10.0000 mmol | Freq: Once | INTRAVENOUS | Status: AC
  Administered 2013-06-06: 10 mmol via INTRAVENOUS
  Filled 2013-06-06: qty 3.33

## 2013-06-06 MED ORDER — DEXTROSE 50 % IV SOLN
25.0000 mL | Freq: Once | INTRAVENOUS | Status: AC | PRN
  Administered 2013-06-06: 25 mL via INTRAVENOUS

## 2013-06-06 MED ORDER — DEXTROSE 10 % IV SOLN
INTRAVENOUS | Status: DC
  Administered 2013-06-06: 19:00:00 via INTRAVENOUS
  Administered 2013-06-06: 500 mL via INTRAVENOUS
  Administered 2013-06-07 – 2013-06-08 (×3): via INTRAVENOUS
  Filled 2013-06-06: qty 1000

## 2013-06-06 MED ORDER — WHITE PETROLATUM GEL
Status: AC
  Administered 2013-06-06: 18:00:00
  Filled 2013-06-06: qty 5

## 2013-06-06 MED ORDER — AMIODARONE LOAD VIA INFUSION
150.0000 mg | Freq: Once | INTRAVENOUS | Status: AC
  Administered 2013-06-06: 150 mg via INTRAVENOUS
  Filled 2013-06-06: qty 83.34

## 2013-06-06 MED ORDER — PIPERACILLIN-TAZOBACTAM 3.375 G IVPB
3.3750 g | Freq: Three times a day (TID) | INTRAVENOUS | Status: DC
  Administered 2013-06-06 – 2013-06-07 (×2): 3.375 g via INTRAVENOUS
  Filled 2013-06-06 (×4): qty 50

## 2013-06-06 MED ORDER — ONDANSETRON HCL 4 MG/2ML IJ SOLN
4.0000 mg | Freq: Four times a day (QID) | INTRAMUSCULAR | Status: DC | PRN
  Administered 2013-06-06 – 2013-06-10 (×4): 4 mg via INTRAVENOUS
  Filled 2013-06-06 (×4): qty 2

## 2013-06-06 MED ORDER — AMIODARONE HCL IN DEXTROSE 360-4.14 MG/200ML-% IV SOLN
30.0000 mg/h | INTRAVENOUS | Status: DC
  Administered 2013-06-06 – 2013-06-13 (×11): 30 mg/h via INTRAVENOUS
  Filled 2013-06-06 (×26): qty 200

## 2013-06-06 NOTE — Progress Notes (Signed)
I have seen and examined this patient and agree with the plan of care. Will continue CVVHDF for now.   [name] [name] W 06/06/2013, 10:33 AM

## 2013-06-06 NOTE — Consult Note (Signed)
Subjective: [name] [name],  transferred from [facility] Dr. Vara Guardian.  She has a hx of  chronic infections, and was found to have right hydro on recent IVP.  She underwent cystoscopy with dilation of a right ureteral stricture and stent placement. The stent came out prematurely in the PACU. She  severe pain but was managed medically, but on Friday she became febrile and rapidly developed sepsis requiring intubation and transfer to [facility]. She has GNR in her blood and a CT from 8/8 showed marked right hydroureteronephrosis.      PMH of stones and has had ureteroscopy by Dr. Gaynelle Arabian back in 2007 and was seen in our office with a stone in 2009. She had some right hydro then without evidence of a stone.    She is now post R percutaneous nephrostomy. 150cc urine today. Blood c/s" E. Coli. IV antibiotics continue: Linezolid, Imipenim, Zosyn.    Objective: Vital signs in last 24 hours: Temp:  [98 F (36.7 C)-100.2 F (37.9 C)] 100.2 F (37.9 C) (08/11 1600) Pulse Rate:  [30-145] 117 (08/11 1400) Resp:  [17-31] 31 (08/11 1600) BP: (72-116)/(42-94) 76/44 mmHg (08/11 1600) SpO2:  [66 %-99 %] 98 % (08/11 1400) FiO2 (%):  [4 %-40 %] 4 % (08/11 1600) Weight:  [103.5 kg (228 lb 2.8 oz)] 103.5 kg (228 lb 2.8 oz) (08/11 0500)A  Intake/Output from previous day: 08/10 0701 - 08/11 0700 In: 3466.3 [I.V.:3211.3; NG/GT:30; IV Piggyback:200] Out: 4382 [Urine:448; Emesis/NG output:300] Intake/Output this shift: Total I/O In: 1021.2 [I.V.:803.2; IV Piggyback:218] Out: 170 [Urine:170]  Past Medical History  Diagnosis Date  . GERD (gastroesophageal reflux disease)   . Kidney stones   . A-fib   . HTN (hypertension)   . Hyperlipidemia     Physical Exam:  Lungs - Normal respiratory effort, chest expands symmetrically.  Abdomen - Soft, non-tender & non-distended.  Lab Results:  Recent Labs  06/04/13 1234 06/05/13 0420 06/06/13 0400  WBC 32.9* 26.2* 17.8*  HGB 12.7 10.8* 9.4*   HCT 40.8 32.1* 27.7*   BMET  Recent Labs  06/05/13 1600 06/06/13 0400  NA 136 138  K 3.9 3.5  CL 101 101  CO2 28 30  GLUCOSE 101* 129*  BUN 15 10  CREATININE 1.33* 1.01  CALCIUM 7.0* 7.2*   No results found for this basename: LABURIN,  in the last 72 hours Results for orders placed during the hospital encounter of 06/04/13  MRSA PCR SCREENING     Status: None   Collection Time    06/04/13 12:29 PM      Result Value Range Status   MRSA by PCR NEGATIVE  NEGATIVE Final   Comment:            The GeneXpert MRSA Assay (FDA     approved for NASAL specimens     only), is one component of a     comprehensive MRSA colonization     surveillance program. It is not     intended to diagnose MRSA     infection nor to guide or     monitor treatment for     MRSA infections.  URINE CULTURE     Status: None   Collection Time    06/04/13 12:50 PM      Result Value Range Status   Specimen Description URINE, CATHETERIZED   Final   Special Requests NONE   Final   Culture  Setup Time     Final   Value: 06/04/2013 12:50  Performed at [facility] Count     Final   Value: NO GROWTH     Performed at [facility]   Culture     Final   Value: NO GROWTH     Performed at [facility]   Report Status 06/05/2013 FINAL   Final  CULTURE, BLOOD (ROUTINE X 2)     Status: None   Collection Time    06/04/13  2:10 PM      Result Value Range Status   Specimen Description BLOOD RIGHT ARM   Final   Special Requests BOTTLES DRAWN AEROBIC ONLY 10CC   Final   Culture  Setup Time     Final   Value: 06/04/2013 16:44     Performed at [facility]   Culture     Final   Value:        BLOOD CULTURE RECEIVED NO GROWTH TO DATE CULTURE WILL BE HELD FOR 5 DAYS BEFORE ISSUING A FINAL NEGATIVE REPORT     Performed at [facility]   Report Status PENDING   Incomplete  CULTURE, BLOOD (ROUTINE X 2)     Status: None   Collection Time    06/04/13  2:55 PM       Result Value Range Status   Specimen Description BLOOD RIGHT ARM   Final   Special Requests BOTTLES DRAWN AEROBIC AND ANAEROBIC 10CC   Final   Culture  Setup Time     Final   Value: 06/04/2013 16:45     Performed at [facility]   Culture     Final   Value:        BLOOD CULTURE RECEIVED NO GROWTH TO DATE CULTURE WILL BE HELD FOR 5 DAYS BEFORE ISSUING A FINAL NEGATIVE REPORT     Performed at [facility]   Report Status PENDING   Incomplete  BODY FLUID CULTURE     Status: None   Collection Time    06/04/13  3:58 PM      Result Value Range Status   Specimen Description FLUID   Final   Special Requests NONE   Final   Gram Stain     Final   Value: WBC PRESENT,BOTH PMN AND MONONUCLEAR     FEW GRAM NEGATIVE RODS     Performed at [facility]   Culture     Final   Value: FEW ESCHERICHIA COLI     Performed at [facility]   Report Status 06/06/2013 FINAL   Final   Organism ID, Bacteria ESCHERICHIA COLI   Final    Studies/Results: RIGHT PERCUTANEOUS NEPHROSTOMY CATHETER PLACEMENT UNDER ULTRASOUND  AND FLUOROSCOPIC GUIDANCE  Technique: The procedure, risks (including but not limited to  bleeding, infection, organ damage. ), benefits, and alternatives  were explained to the daughter, as the patient was intubated and  unable to provide consent. Questions regarding the procedure were  encouraged and answered. The daughter understands and consents to  the procedure.  The rightflank region prepped with Betadine, draped in usual  sterile fashion, infiltrated locally with 1% lidocaine.The patient  was already receiving adequate antibiotic coverage.  Intravenous Fentanyl and Versed were administered as conscious  sedation during continuous cardiorespiratory monitoring by the  radiology RN, with a total moderate sedation time of 20 minutes.  Under real-time ultrasound guidance, a 21-gauge trocar needle was  advanced into a posterior lower pole calyx. Ultrasound  image  documentation was saved.  Urine spontaneously returned through the  needle. Needle was exchanged over a guidewire for transitional  dilator. Contrast injection confirmed appropriate positioning.  Catheter was exchanged over a guidewire for a 10 French pigtail  catheter, formed centrally within the right renal collecting  system. Contrast injection confirms appropriate positioning and  patency. Catheter secured externally with 0 Prolene suture and  placed to external drain bag. A 20 ml aspirate of the cloudy urine  returned through the catheter was sent for Gram stain, culture and  sensitivity. No immediate complication.  Fluoroscopy time: 19seconds  IMPRESSION  Technically successful right percutaneous nephrostomy catheter  placement.  Original Report Authenticated By: D. Wallace Going, MD         Assessment: Awake and extuated this pm. Foley urine > Nephrostomy urine.  Urosepsis responding to IV antibiotics and percutaneous drainage. She will eventually need antegrade nephrostogram and attempt at internalization of stent.   Plan: Continue to follow.   Carolan Clines I 06/06/2013, 6:27 PM

## 2013-06-06 NOTE — Progress Notes (Signed)
Subjective: Patient is nauseated this am.  Denies chills except at bath time. Patient denies abdominal pain.  Husband in the room and concerned about wife's heart rate.    Objective: Vital signs in last 24 hours: Filed Vitals:   06/06/13 0400 06/06/13 0403 06/06/13 0500 06/06/13 0600  BP: 88/72 88/72 83/55  94/74  Pulse: 66 127 90 54  Temp: 98.5 F (36.9 C)  98.6 F (37 C) 98.4 F (36.9 C)  TempSrc:      Resp: 23  24 24   Height:      Weight:   228 lb 2.8 oz (103.5 kg)   SpO2: 77% 95% 89% 79%   Weight change: -10.6 oz (-0.3 kg)  Intake/Output Summary (Last 24 hours) at 06/06/13 1015 Last data filed at 06/06/13 0600  Gross per 24 hour  Intake 2906.25 ml  Output   3952 ml  Net -1045.75 ml   Vitals reviewed. General: resting in bed, NAD, alert and oriented x 3  HEENT: [place]/at, NGT intact, no scleral icterus Cardiac: atrial fibrillation, no murmurs  Pulm: clear to auscultation bilaterally, no wheezes, rales, or rhonchi Abd/GU: soft, nontender, nondistended, BS present, right nephrostomy tube intact Ext: warm and well perfused, no pedal edema Neuro: alert and oriented X3   Lab Results: Basic Metabolic Panel:  Recent Labs Lab 06/05/13 0420 06/05/13 1600 06/06/13 0400  NA 137 136 138  K 4.1 3.9 3.5  CL 102 101 101  CO2 23 28 30   GLUCOSE 110* 101* 129*  BUN 19 15 10   CREATININE 1.57* 1.33* 1.01  CALCIUM 7.0* 7.0* 7.2*  MG 1.8  --  2.2  PHOS  --  1.5* 1.0*   Liver Function Tests: CBC:  Recent Labs Lab 06/05/13 0420 06/06/13 0400  WBC 26.2* 17.8*  HGB 10.8* 9.4*  HCT 32.1* 27.7*  MCV 80.0 79.4  PLT 152 106*   Cardiac Enzymes:  Recent Labs Lab 06/05/13 1555 06/05/13 2313 06/06/13 0400  TROPONINI 4.31* 4.14* 3.58*   BNP:  Recent Labs Lab 06/04/13 1235  PROBNP 22349.0*   CBG:  Recent Labs Lab 06/05/13 1952 06/05/13 2046 06/05/13 2348 06/06/13 0020 06/06/13 0346 06/06/13 0441  GLUCAP 105* 98 56* 86 65* 97   Coagulation:  Recent  Labs Lab 06/04/13 1325  LABPROT 18.9*  INR 1.63*   Urinalysis:  Recent Labs Lab 06/04/13 1250  COLORURINE AMBER*  LABSPEC 1.018  PHURINE 5.0  GLUCOSEU NEGATIVE  HGBUR LARGE*  BILIRUBINUR SMALL*  KETONESUR 15*  PROTEINUR 100*  UROBILINOGEN 1.0  NITRITE NEGATIVE  LEUKOCYTESUR SMALL*   Misc. Labs: [facility] blood cultures grew GNR 06/04/13 Urine and blood cultures NTD  06/04/13 Body fluid culture   Micro Results: Recent Results (from the past 240 hour(s))  MRSA PCR SCREENING     Status: None   Collection Time    06/04/13 12:29 PM      Result Value Range Status   MRSA by PCR NEGATIVE  NEGATIVE Final   Comment:            The GeneXpert MRSA Assay (FDA     approved for NASAL specimens     only), is one component of a     comprehensive MRSA colonization     surveillance program. It is not     intended to diagnose MRSA     infection nor to guide or     monitor treatment for     MRSA infections.  URINE CULTURE     Status: None  Collection Time    06/04/13 12:50 PM      Result Value Range Status   Specimen Description URINE, CATHETERIZED   Final   Special Requests NONE   Final   Culture  Setup Time     Final   Value: 06/04/2013 12:50     Performed at [facility] Count     Final   Value: NO GROWTH     Performed at [facility]   Culture     Final   Value: NO GROWTH     Performed at [facility]   Report Status 06/05/2013 FINAL   Final  CULTURE, BLOOD (ROUTINE X 2)     Status: None   Collection Time    06/04/13  2:10 PM      Result Value Range Status   Specimen Description BLOOD RIGHT ARM   Final   Special Requests BOTTLES DRAWN AEROBIC ONLY 10CC   Final   Culture  Setup Time     Final   Value: 06/04/2013 16:44     Performed at [facility]   Culture     Final   Value:        BLOOD CULTURE RECEIVED NO GROWTH TO DATE CULTURE WILL BE HELD FOR 5 DAYS BEFORE ISSUING A FINAL NEGATIVE REPORT     Performed at [facility]   Report Status PENDING   Incomplete  CULTURE, BLOOD (ROUTINE X 2)     Status: None   Collection Time    06/04/13  2:55 PM      Result Value Range Status   Specimen Description BLOOD RIGHT ARM   Final   Special Requests BOTTLES DRAWN AEROBIC AND ANAEROBIC 10CC   Final   Culture  Setup Time     Final   Value: 06/04/2013 16:45     Performed at [facility]   Culture     Final   Value:        BLOOD CULTURE RECEIVED NO GROWTH TO DATE CULTURE WILL BE HELD FOR 5 DAYS BEFORE ISSUING A FINAL NEGATIVE REPORT     Performed at [facility]   Report Status PENDING   Incomplete  BODY FLUID CULTURE     Status: None   Collection Time    06/04/13  3:58 PM      Result Value Range Status   Specimen Description FLUID   Final   Special Requests NONE   Final   Gram Stain     Final   Value: WBC PRESENT,BOTH PMN AND MONONUCLEAR     FEW GRAM NEGATIVE RODS     Performed at [facility]   Culture     Final   Value: FEW ESCHERICHIA COLI     Performed at [facility]   Report Status PENDING   Incomplete   Studies/Results: Ir Perc Nephrostomy Right  06/05/2013   *RADIOLOGY REPORT*   Clinical data: Clinical evidence of sepsis requiring intubation after attempted cystoscopic right renal decompression and stenting.  RIGHT PERCUTANEOUS NEPHROSTOMY CATHETER PLACEMENT UNDER ULTRASOUND AND FLUOROSCOPIC GUIDANCE  Technique: The procedure, risks (including but not limited to bleeding, infection, organ damage.  ), benefits, and alternatives were explained to the daughter, as the patient was intubated and unable to provide consent.  Questions regarding the procedure were encouraged and answered.  The daughter understands and consents to the procedure. The rightflank region prepped with Betadine, draped in usual sterile fashion, infiltrated  locally with 1% lidocaine.The patient was already receiving adequate antibiotic coverage.  Intravenous Fentanyl and Versed were administered  as conscious sedation during continuous cardiorespiratory monitoring by the radiology RN, with a total moderate sedation time of 20 minutes.   Under real-time ultrasound guidance, a 21-gauge trocar needle was advanced into a posterior lower pole calyx. Ultrasound image documentation was saved. Urine spontaneously returned through the needle. Needle was exchanged over a guidewire for transitional dilator. Contrast injection confirmed appropriate positioning. Catheter was exchanged over a guidewire for a 10 French pigtail catheter, formed centrally within the right renal collecting system. Contrast injection confirms appropriate positioning and patency. Catheter   secured externally with 0 Prolene suture and placed to external drain bag. A 20 ml aspirate of the cloudy urine returned through the catheter was sent for Gram stain, culture and sensitivity.  No immediate complication.  Fluoroscopy time: 19seconds  IMPRESSION Technically successful right percutaneous nephrostomy catheter placement.   Original Report Authenticated By: D. Wallace Going, MD   Ir US Guide Bx Asp/drain  06/05/2013   *RADIOLOGY REPORT*   Clinical data: Clinical evidence of sepsis requiring intubation after attempted cystoscopic right renal decompression and stenting.  RIGHT PERCUTANEOUS NEPHROSTOMY CATHETER PLACEMENT UNDER ULTRASOUND AND FLUOROSCOPIC GUIDANCE  Technique: The procedure, risks (including but not limited to bleeding, infection, organ damage.  ), benefits, and alternatives were explained to the daughter, as the patient was intubated and unable to provide consent.  Questions regarding the procedure were encouraged and answered.  The daughter understands and consents to the procedure. The rightflank region prepped with Betadine, draped in usual sterile fashion, infiltrated locally with 1% lidocaine.The patient was already receiving adequate antibiotic coverage.  Intravenous Fentanyl and Versed were administered as conscious sedation  during continuous cardiorespiratory monitoring by the radiology RN, with a total moderate sedation time of 20 minutes.   Under real-time ultrasound guidance, a 21-gauge trocar needle was advanced into a posterior lower pole calyx. Ultrasound image documentation was saved. Urine spontaneously returned through the needle. Needle was exchanged over a guidewire for transitional dilator. Contrast injection confirmed appropriate positioning. Catheter was exchanged over a guidewire for a 10 French pigtail catheter, formed centrally within the right renal collecting system. Contrast injection confirms appropriate positioning and patency. Catheter   secured externally with 0 Prolene suture and placed to external drain bag. A 20 ml aspirate of the cloudy urine returned through the catheter was sent for Gram stain, culture and sensitivity.  No immediate complication.  Fluoroscopy time: 19seconds  IMPRESSION Technically successful right percutaneous nephrostomy catheter placement.   Original Report Authenticated By: D. Wallace Going, MD   Dg Chest Port 1 View  06/06/2013   *RADIOLOGY REPORT*  Clinical Data: Evaluate endotracheal tube placement and line placement.  PORTABLE CHEST - 1 VIEW  Comparison: 06/05/2013.  Findings: An endotracheal tube is in place with tip 4.7 cm above the carina. There is a left-sided internal jugular central venous catheter with tip terminating in the mid superior vena cava. There is a right-sided internal jugular central venous catheter with tip terminating in the mid superior vena cava. A nasogastric tube is seen extending into the stomach, however, the tip of the nasogastric tube extends below the lower margin of the image.  Lung volumes are slightly low.  Persistent left basilar opacity may reflect atelectasis and/or consolidation in the left lower lobe with superimposed small left pleural effusion.  Mild cephalization of the pulmonary vasculature, without frank pulmonary edema. Subsegmental  atelectasis in the right mid  to upper lung.  Heart size is normal.  Atherosclerosis in the thoracic aorta. The patient is rotated to the left on today's exam, resulting in distortion of the mediastinal contours and reduced diagnostic sensitivity and specificity for mediastinal pathology.  IMPRESSION: 1.  Support apparatus, as above. 2.  Pulmonary venous congestion, without frank pulmonary edema. 3.  Atelectasis and/or consolidation in the left lower lobe with a superimposed small left pleural effusion.   Original Report Authenticated By: Vinnie Langton, M.D.   Dg Chest Port 1 View  06/05/2013   *RADIOLOGY REPORT*  Clinical Data: Respiratory failure  PORTABLE CHEST - 1 VIEW  Comparison: 06/04/2013  Findings: Endotracheal tube is approximately 6 cm above the carina. Right jugular catheter tip in the SVC.  Left jugular catheter tip in the mid SVC.  Negative for pneumothorax.  Left lower lobe atelectasis and effusion unchanged.  Negative for pulmonary edema.  IMPRESSION: No significant change.  Left lower lobe consolidation and left effusion are stable.   Original Report Authenticated By: Carl Best, M.D.   Dg Chest Port 1 View  06/04/2013   *RADIOLOGY REPORT*  Clinical Data: Sepsis, renal failure.  PORTABLE CHEST - 1 VIEW  Comparison: Earlier film of the same day  Findings: Endotracheal tube, nasogastric tube, and left IJ central line are stable in position.  New right IJ dialysis catheter extends to the proximal SVC.  No pneumothorax.  Persistent left lower lung consolidation / atelectasis with adjacent effusion. Patchy perihilar interstitial infiltrates bilaterally as before.  IMPRESSION:  1.  Right IJ dialysis catheter placement to the proximal SVC, no pneumothorax.   Original Report Authenticated By: D. Wallace Going, MD   Dg Chest Port 1 View  06/04/2013   *RADIOLOGY REPORT*  Clinical Data: Endotracheal tube placement  PORTABLE CHEST - 1 VIEW  Comparison: 06/04/2013  Findings: Endotracheal tube is 2.5 cm  above the carina.  Left jugular catheter tip in the SVC unchanged.  No pneumothorax.  NG tube enters the stomach.  Bibasilar atelectasis again noted.  Vascular congestion and mild edema appears improved.  IMPRESSION: Endotracheal tube remains in good position.  Bibasilar atelectasis unchanged.  Improvement in pulmonary edema.   Original Report Authenticated By: Carl Best, M.D.   Medications:  Scheduled Meds: . amiodarone  150 mg Intravenous Once  . antiseptic oral rinse  15 mL Mouth Rinse QID  . chlorhexidine  15 mL Mouth Rinse BID  . heparin  5,000 Units Subcutaneous Q8H  . pantoprazole (PROTONIX) IV  40 mg Intravenous QHS  . piperacillin-tazobactam  3.375 g Intravenous Q6H  . sodium phosphate  Dextrose 5% IVPB  10 mmol Intravenous Once   Continuous Infusions: . amiodarone (NEXTERONE PREMIX) 360 mg/200 mL dextrose     Followed by  . amiodarone (NEXTERONE PREMIX) 360 mg/200 mL dextrose    . dextrose 500 mL (06/06/13 0431)  . dialysis replacement fluid (prismasate) 1,000 mL/hr at 06/06/13 0500  . dialysis replacement fluid (prismasate) 1,000 mL/hr at 06/06/13 0500  . dialysate (PRISMASATE) 2,000 mL/hr at 06/06/13 0500   PRN Meds:.sodium chloride, fentaNYL, heparin, metoprolol, midazolam, ondansetron Assessment/Plan: 70F transferred from [facility] to [facility] 8/9 with septic shock, AKI, ATN, likely pyelonephritis, GNR bacteremia (found at [facility]), history of metabolic and respiratory acidosis this admission, hypoxic/hypercapneic respiratory failure (status post intubation 8/9) in setting of right sided ureteral obstruction s/p stent failure, lactic acidosis (4.7 on 8/9), elevated troponin likely 2/2 demand ischemia and acute renal failure.    1. Acute renal failure/ATN: Multifactorial etiology.  Received CRRT today which will be discontinued.    2. History of combined respiratory and metabolic acidosis likely 2/2 to lactic acidosis: Received CRRT today and will be discontinued  CRRT today   3. Septic shock from GU source (likely right pyelonephritis with GNR bacteremia): status post right percutaneous nephrostomy tube for right hydronephrosis.  Body fluid culture with few E.coli from culture. Continue Zosyn (started 8/9).    4. Elevated troponin likely secondary to demand ischemia and acute renal failure: Troponin level trending down. Continue to follow.  5. Right hydronephrosis and stricture: Urology consulted after patient was status post cystoscopy and right ureteral stricture dilation with stent placement and failure (stent no longer present).  Status post right percutaneous nephrostomy tube.  Per Urology when patient recovers she will need possibly right ureteral reimplantation for stricture.  6. Acute respiratory failure: Trying to wean off ventilation today.   7. Atrial fibrillation: history of and patient is in atrial fibrillation currently. Primary team to address  8. Electrolyte abnormality: hypophosphatemia 1.0: will need repletion   The patient does have a current PCP ([name], MD). Outpatient urologist is Dr. Vara Guardian   LOS: 2 days   Cresenciano Genre [id number] 06/06/2013, 10:15 AM

## 2013-06-06 NOTE — Progress Notes (Signed)
Hypoglycemic Event  CBG: 56  Treatment: amp d50  Symptoms: none  Follow-up CBG: Time:0020 CBG Result:86  Possible Reasons for Event: NPO  Comments/MD notified:Simonds MD    Lamar Sprinkles  Remember to initiate Hypoglycemia Order Set & complete

## 2013-06-06 NOTE — Progress Notes (Signed)
  Amiodarone Drug - Drug Interaction Consult Note  Recommendations: No changes necessary at this time. Patient is on prn Lopressor. Monitor for bradycardia.   Amiodarone is metabolized by the cytochrome P450 system and therefore has the potential to cause many drug interactions. Amiodarone has an average plasma half-life of 50 days (range 20 to 100 days).   There is potential for drug interactions to occur several weeks or months after stopping treatment and the onset of drug interactions may be slow after initiating amiodarone.   []  Statins: Increased risk of myopathy. Simvastatin- restrict dose to 20mg  daily. Other statins: counsel patients to report any muscle pain or weakness immediately.  []  Anticoagulants: Amiodarone can increase anticoagulant effect. Consider warfarin dose reduction. Patients should be monitored closely and the dose of anticoagulant altered accordingly, remembering that amiodarone levels take several weeks to stabilize.  []  Antiepileptics: Amiodarone can increase plasma concentration of phenytoin, the dose should be reduced. Note that small changes in phenytoin dose can result in large changes in levels. Monitor patient and counsel on signs of toxicity.  [x]  Beta blockers: increased risk of bradycardia, AV block and myocardial depression. Sotalol - avoid concomitant use.  []   Calcium channel blockers (diltiazem and verapamil): increased risk of bradycardia, AV block and myocardial depression.  []   Cyclosporine: Amiodarone increases levels of cyclosporine. Reduced dose of cyclosporine is recommended.  []  Digoxin dose should be halved when amiodarone is started.  []  Diuretics: increased risk of cardiotoxicity if hypokalemia occurs.  []  Oral hypoglycemic agents (glyburide, glipizide, glimepiride): increased risk of hypoglycemia. Patient's glucose levels should be monitored closely when initiating amiodarone therapy.   []  Drugs that prolong the QT interval:  Torsades  de pointes risk may be increased with concurrent use - avoid if possible.  Monitor QTc, also keep magnesium/potassium WNL if concurrent therapy can't be avoided. [name] Antibiotics: e.g. fluoroquinolones, erythromycin. . Antiarrhythmics: e.g. quinidine, procainamide, disopyramide, sotalol. . Antipsychotics: e.g. phenothiazines, haloperidol.  . Lithium, tricyclic antidepressants, and methadone.  Thank You,  Lorenda Peck  06/06/2013 2:59 PM

## 2013-06-06 NOTE — Progress Notes (Signed)
ANTIBIOTIC CONSULT NOTE - Follow Up  Pharmacy Consult for Zosyn Indication: Gram-negative sepsis  Allergies  Allergen Reactions  . Codeine Nausea Only    hallucinations  . Hydrocodone Nausea Only    hallucinations  . Macrodantin (Nitrofurantoin) Nausea And Vomiting    Patient Measurements: Height: 5\' 5"  (165.1 cm) Weight: 228 lb 2.8 oz (103.5 kg) IBW/kg (Calculated) : 57 Weight per chart from [place] 94.7 kg  Vital Signs: Temp: 98.4 F (36.9 C) (08/11 0600) BP: 94/74 mmHg (08/11 0600) Pulse Rate: 54 (08/11 0600) Intake/Output from previous day: 08/10 0701 - 08/11 0700 In: 3466.3 [I.V.:3211.3; NG/GT:30; IV Piggyback:200] Out: [id number] [Urine:448; Emesis/NG output:300] Intake/Output from this shift: Total I/O In: 1021.2 [I.V.:803.2; IV Piggyback:218] Out: 170 [Urine:170]  Labs:  Recent Labs  06/04/13 1234  06/05/13 0420 06/05/13 1600 06/06/13 0400  WBC 32.9*  --  26.2*  --  17.8*  HGB 12.7  --  10.8*  --  9.4*  PLT 273  --  152  --  106*  CREATININE 3.22*  < > 1.57* 1.33* 1.01  < > = values in this interval not displayed. Estimated Creatinine Clearance: 61 ml/min (by C-G formula based on Cr of 1.01). No results found for this basename: VANCOTROUGH, Corlis Leak, VANCORANDOM, GENTTROUGH, GENTPEAK, GENTRANDOM, TOBRATROUGH, TOBRAPEAK, TOBRARND, AMIKACINPEAK, AMIKACINTROU, AMIKACIN,  in the last 72 hours  SCr at [facility] 3.3, Estimated CrCl ~15 ml/min Anuric  Microbiology: Recent Results (from the past 720 hour(s))  MRSA PCR SCREENING     Status: None   Collection Time    06/04/13 12:29 PM      Result Value Range Status   MRSA by PCR NEGATIVE  NEGATIVE Final   Comment:            The GeneXpert MRSA Assay (FDA     approved for NASAL specimens     only), is one component of a     comprehensive MRSA colonization     surveillance program. It is not     intended to diagnose MRSA     infection nor to guide or     monitor treatment for     MRSA infections.  URINE  CULTURE     Status: None   Collection Time    06/04/13 12:50 PM      Result Value Range Status   Specimen Description URINE, CATHETERIZED   Final   Special Requests NONE   Final   Culture  Setup Time     Final   Value: 06/04/2013 12:50     Performed at [facility]     Final   Value: NO GROWTH     Performed at [facility]   Culture     Final   Value: NO GROWTH     Performed at [facility]   Report Status 06/05/2013 FINAL   Final  CULTURE, BLOOD (ROUTINE X 2)     Status: None   Collection Time    06/04/13  2:10 PM      Result Value Range Status   Specimen Description BLOOD RIGHT ARM   Final   Special Requests BOTTLES DRAWN AEROBIC ONLY 10CC   Final   Culture  Setup Time     Final   Value: 06/04/2013 16:44     Performed at [facility]   Culture     Final   Value:        BLOOD CULTURE RECEIVED NO GROWTH TO DATE CULTURE  WILL BE HELD FOR 5 DAYS BEFORE ISSUING A FINAL NEGATIVE REPORT     Performed at [facility]   Report Status PENDING   Incomplete  CULTURE, BLOOD (ROUTINE X 2)     Status: None   Collection Time    06/04/13  2:55 PM      Result Value Range Status   Specimen Description BLOOD RIGHT ARM   Final   Special Requests BOTTLES DRAWN AEROBIC AND ANAEROBIC 10CC   Final   Culture  Setup Time     Final   Value: 06/04/2013 16:45     Performed at [facility]   Culture     Final   Value:        BLOOD CULTURE RECEIVED NO GROWTH TO DATE CULTURE WILL BE HELD FOR 5 DAYS BEFORE ISSUING A FINAL NEGATIVE REPORT     Performed at [facility]   Report Status PENDING   Incomplete  BODY FLUID CULTURE     Status: None   Collection Time    06/04/13  3:58 PM      Result Value Range Status   Specimen Description FLUID   Final   Special Requests NONE   Final   Gram Stain     Final   Value: WBC PRESENT,BOTH PMN AND MONONUCLEAR     FEW GRAM NEGATIVE RODS     Performed at [facility]   Culture     Final    Value: FEW ESCHERICHIA COLI     Performed at [facility]   Report Status 06/06/2013 FINAL   Final   Organism ID, Bacteria ESCHERICHIA COLI   Final   Blood cultures 2/2 GNR (per call from [facility] lab)  Medical History: Past Medical History  Diagnosis Date  . GERD (gastroesophageal reflux disease)   . Kidney stones   . A-fib   . HTN (hypertension)   . Hyperlipidemia     Medications:  Anti-infectives   Start     Dose/Rate Route Frequency Ordered Stop   06/06/13 1800  piperacillin-tazobactam (ZOSYN) IVPB 3.375 g     3.375 g 12.5 mL/hr over 240 Minutes Intravenous 3 times per day 06/06/13 1430     06/04/13 1500  piperacillin-tazobactam (ZOSYN) IVPB 3.375 g  Status:  Discontinued     3.375 g 100 mL/hr over 30 Minutes Intravenous Every 6 hours 06/04/13 1441 06/06/13 1430   06/04/13 1400  piperacillin-tazobactam (ZOSYN) IVPB 2.25 g  Status:  Discontinued     2.25 g 100 mL/hr over 30 Minutes Intravenous 3 times per day 06/04/13 1333 06/04/13 1441     ABX given at [facility]: Cefepime 2gm (8/8 2045) Primaxin 250mg  (8/8 2345) Linezolid 600mg  (8/9 0015)  Assessment: Sepsis due to Gram-negative bacteremia resulting from kidney obstruction - to begin empiric antimicrobial coverage with Zosyn.  Note she is in acute renal failure and is anuric per report.  Discussed with Dr. Lamonte Sakai - given Gram-negatives in blood and renal failure will not start Vancomycin coverage at this time.   Patient no longer on CRRT. AKI appears resolved. No history of CKD noted.  Plan:  Zosyn 3.375g IV q 8 hours (4 hour infusion) Monitor renal function F/U renal's plan re: further CRRT needs  Emmamae Mcnamara C. Jullian Clayson, PharmD Clinical Pharmacist-Resident Pager: [phone] Pharmacy: [phone] 06/06/2013 2:32 PM

## 2013-06-06 NOTE — Progress Notes (Signed)
Subjective: R PCN placed 8/9 R hydro- urosepsis Better today- communicating over vent afeb Wbc trending down  Objective: Vital signs in last 24 hours: Temp:  [97.3 F (36.3 C)-98.6 F (37 C)] 98.4 F (36.9 C) (08/11 0600) Pulse Rate:  [30-145] 54 (08/11 0600) Resp:  [17-28] 24 (08/11 0600) BP: (72-158)/(30-124) 94/74 mmHg (08/11 0600) SpO2:  [66 %-100 %] 79 % (08/11 0600) FiO2 (%):  [40 %-50 %] 40 % (08/11 0804) Weight:  [228 lb 2.8 oz (103.5 kg)] 228 lb 2.8 oz (103.5 kg) (08/11 0500) Last BM Date: 06/05/13  Intake/Output from previous day: 08/10 0701 - 08/11 0700 In: 3371.3 [I.V.:3116.3; NG/GT:30; IV Piggyback:200] Out: 4382 [Urine:448; Emesis/NG output:300] Intake/Output this shift:    PE:  Afeb; communicating - nodding yes/no- altho on vent BP 80-100s/70s Rt PCN intact; tender site per pt Output still bloody 110 cc yesterday; minimal in bag now Wbc: 17.8 (26.2) Bun/Cr: 10/1.01 (15/1.3)   Lab Results:   Recent Labs  06/05/13 0420 06/06/13 0400  WBC 26.2* 17.8*  HGB 10.8* 9.4*  HCT 32.1* 27.7*  PLT 152 106*   BMET  Recent Labs  06/05/13 1600 06/06/13 0400  NA 136 138  K 3.9 3.5  CL 101 101  CO2 28 30  GLUCOSE 101* 129*  BUN 15 10  CREATININE 1.33* 1.01  CALCIUM 7.0* 7.2*   PT/INR  Recent Labs  06/04/13 1325  LABPROT 18.9*  INR 1.63*   ABG  Recent Labs  06/04/13 2348 06/05/13 0425  PHART 7.298* 7.392  HCO3 17.0* 22.9    Studies/Results: Ir Perc Nephrostomy Right  06/05/2013   *RADIOLOGY REPORT*   Clinical data: Clinical evidence of sepsis requiring intubation after attempted cystoscopic right renal decompression and stenting.  RIGHT PERCUTANEOUS NEPHROSTOMY CATHETER PLACEMENT UNDER ULTRASOUND AND FLUOROSCOPIC GUIDANCE  Technique: The procedure, risks (including but not limited to bleeding, infection, organ damage.  ), benefits, and alternatives were explained to the daughter, as the patient was intubated and unable to provide  consent.  Questions regarding the procedure were encouraged and answered.  The daughter understands and consents to the procedure. The rightflank region prepped with Betadine, draped in usual sterile fashion, infiltrated locally with 1% lidocaine.The patient was already receiving adequate antibiotic coverage.  Intravenous Fentanyl and Versed were administered as conscious sedation during continuous cardiorespiratory monitoring by the radiology RN, with a total moderate sedation time of 20 minutes.   Under real-time ultrasound guidance, a 21-gauge trocar needle was advanced into a posterior lower pole calyx. Ultrasound image documentation was saved. Urine spontaneously returned through the needle. Needle was exchanged over a guidewire for transitional dilator. Contrast injection confirmed appropriate positioning. Catheter was exchanged over a guidewire for a 10 French pigtail catheter, formed centrally within the right renal collecting system. Contrast injection confirms appropriate positioning and patency. Catheter   secured externally with 0 Prolene suture and placed to external drain bag. A 20 ml aspirate of the cloudy urine returned through the catheter was sent for Gram stain, culture and sensitivity.  No immediate complication.  Fluoroscopy time: 19seconds  IMPRESSION Technically successful right percutaneous nephrostomy catheter placement.   Original Report Authenticated By: D. Wallace Going, MD   Ir US Guide Bx Asp/drain  06/05/2013   *RADIOLOGY REPORT*   Clinical data: Clinical evidence of sepsis requiring intubation after attempted cystoscopic right renal decompression and stenting.  RIGHT PERCUTANEOUS NEPHROSTOMY CATHETER PLACEMENT UNDER ULTRASOUND AND FLUOROSCOPIC GUIDANCE  Technique: The procedure, risks (including but not limited to bleeding, infection, organ damage.  ),  benefits, and alternatives were explained to the daughter, as the patient was intubated and unable to provide consent.  Questions  regarding the procedure were encouraged and answered.  The daughter understands and consents to the procedure. The rightflank region prepped with Betadine, draped in usual sterile fashion, infiltrated locally with 1% lidocaine.The patient was already receiving adequate antibiotic coverage.  Intravenous Fentanyl and Versed were administered as conscious sedation during continuous cardiorespiratory monitoring by the radiology RN, with a total moderate sedation time of 20 minutes.   Under real-time ultrasound guidance, a 21-gauge trocar needle was advanced into a posterior lower pole calyx. Ultrasound image documentation was saved. Urine spontaneously returned through the needle. Needle was exchanged over a guidewire for transitional dilator. Contrast injection confirmed appropriate positioning. Catheter was exchanged over a guidewire for a 10 French pigtail catheter, formed centrally within the right renal collecting system. Contrast injection confirms appropriate positioning and patency. Catheter   secured externally with 0 Prolene suture and placed to external drain bag. A 20 ml aspirate of the cloudy urine returned through the catheter was sent for Gram stain, culture and sensitivity.  No immediate complication.  Fluoroscopy time: 19seconds  IMPRESSION Technically successful right percutaneous nephrostomy catheter placement.   Original Report Authenticated By: D. Wallace Going, MD   Dg Chest Port 1 View  06/06/2013   *RADIOLOGY REPORT*  Clinical Data: Evaluate endotracheal tube placement and line placement.  PORTABLE CHEST - 1 VIEW  Comparison: 06/05/2013.  Findings: An endotracheal tube is in place with tip 4.7 cm above the carina. There is a left-sided internal jugular central venous catheter with tip terminating in the mid superior vena cava. There is a right-sided internal jugular central venous catheter with tip terminating in the mid superior vena cava. A nasogastric tube is seen extending into the stomach,  however, the tip of the nasogastric tube extends below the lower margin of the image.  Lung volumes are slightly low.  Persistent left basilar opacity may reflect atelectasis and/or consolidation in the left lower lobe with superimposed small left pleural effusion.  Mild cephalization of the pulmonary vasculature, without frank pulmonary edema. Subsegmental atelectasis in the right mid to upper lung.  Heart size is normal.  Atherosclerosis in the thoracic aorta. The patient is rotated to the left on today's exam, resulting in distortion of the mediastinal contours and reduced diagnostic sensitivity and specificity for mediastinal pathology.  IMPRESSION: 1.  Support apparatus, as above. 2.  Pulmonary venous congestion, without frank pulmonary edema. 3.  Atelectasis and/or consolidation in the left lower lobe with a superimposed small left pleural effusion.   Original Report Authenticated By: Vinnie Langton, M.D.   Dg Chest Port 1 View  06/05/2013   *RADIOLOGY REPORT*  Clinical Data: Respiratory failure  PORTABLE CHEST - 1 VIEW  Comparison: 06/04/2013  Findings: Endotracheal tube is approximately 6 cm above the carina. Right jugular catheter tip in the SVC.  Left jugular catheter tip in the mid SVC.  Negative for pneumothorax.  Left lower lobe atelectasis and effusion unchanged.  Negative for pulmonary edema.  IMPRESSION: No significant change.  Left lower lobe consolidation and left effusion are stable.   Original Report Authenticated By: Carl Best, M.D.   Dg Chest Port 1 View  06/04/2013   *RADIOLOGY REPORT*  Clinical Data: Sepsis, renal failure.  PORTABLE CHEST - 1 VIEW  Comparison: Earlier film of the same day  Findings: Endotracheal tube, nasogastric tube, and left IJ central line are stable in position.  New right  IJ dialysis catheter extends to the proximal SVC.  No pneumothorax.  Persistent left lower lung consolidation / atelectasis with adjacent effusion. Patchy perihilar interstitial infiltrates  bilaterally as before.  IMPRESSION:  1.  Right IJ dialysis catheter placement to the proximal SVC, no pneumothorax.   Original Report Authenticated By: D. Wallace Going, MD   Dg Chest Port 1 View  06/04/2013   *RADIOLOGY REPORT*  Clinical Data: Endotracheal tube placement  PORTABLE CHEST - 1 VIEW  Comparison: 06/04/2013  Findings: Endotracheal tube is 2.5 cm above the carina.  Left jugular catheter tip in the SVC unchanged.  No pneumothorax.  NG tube enters the stomach.  Bibasilar atelectasis again noted.  Vascular congestion and mild edema appears improved.  IMPRESSION: Endotracheal tube remains in good position.  Bibasilar atelectasis unchanged.  Improvement in pulmonary edema.   Original Report Authenticated By: Carl Best, M.D.    Anti-infectives: Anti-infectives   Start     Dose/Rate Route Frequency Ordered Stop   06/04/13 1500  piperacillin-tazobactam (ZOSYN) IVPB 3.375 g     3.375 g 100 mL/hr over 30 Minutes Intravenous Every 6 hours 06/04/13 1441     06/04/13 1400  piperacillin-tazobactam (ZOSYN) IVPB 2.25 g  Status:  Discontinued     2.25 g 100 mL/hr over 30 Minutes Intravenous 3 times per day 06/04/13 1333 06/04/13 1441      Assessment/Plan: s/p * No surgery found *  Rt hydro- urosepsis R PCN placed 8/9 Better Still on vent Will follow Be sure to flush drain tube   LOS: 2 days    [name] [name] A 06/06/2013

## 2013-06-06 NOTE — Progress Notes (Signed)
Hypoglycemic Event  CBG: 65  Treatment: amp D50, Dextrose D10 added to iv fluid  Symptoms: none  Follow-up CBG: Time: 0430 CBG Result:97  Possible Reasons for Event: NPO  Comments/MD notified:MD Simonds, D10 iv fluids added    Lamar Sprinkles  Remember to initiate Hypoglycemia Order Set & complete

## 2013-06-06 NOTE — Progress Notes (Deleted)
Patient on ventilator, will reassess  Documented on wrong patient.  [name] [name]

## 2013-06-06 NOTE — Progress Notes (Signed)
PULMONARY  / CRITICAL CARE MEDICINE  Name: RAZIYAH IMBERT MRN: [id number] DOB: September 11, 1941    ADMISSION DATE:  06/04/2013  REFERRING MD :  [facility]  PRIMARY SERVICE: PCCM   CHIEF COMPLAINT:  Shock, renal failure   BRIEF PATIENT DESCRIPTION: 72 yo female with hx kidney stones, frequent UTI, HTN tx 8/9 from [facility] with septic shock, acute renal failure and R pyelonephritis after failed ureteral stent placement 8/7.  S/p R perc nephrostomy 8/9. CVVHD.   SIGNIFICANT EVENTS / STUDIES:  CT abd/pelvis 8/9>>> mod to severe R hydronephrosis with R sided perinephric stranding and fluid.  Dilation of the ureter to the L of the R hemipelvis, without evidence of a distal obstructing stone.  Pyelonephritis cannot be excluded.  Left renal cyst.   LINES / TUBES: L IJ CVL 8/9>>> ETT 8/9>>> R IJ HD cath 8/9 >>   CULTURES: Blood 8/9 [facility]) >> GNR >> E coli>>> Urine 8/9>>> BCx2 8/9>>> R nephrostomy drainage 8/9 >> few e coli>>>  ANTIBIOTICS: Linezolid x1 8/9 Imipenem x1 8/9 Zosyn 8/9 >>  Interval events:  R nephrostomy placed by Dr Levada Schilling weaned to off CVVHD tolerated, no heparin  Overnight: No pressors, awake, weaning Fib, rvr Neg 1 liter  VITAL SIGNS: Temp:  [97.3 F (36.3 C)-98.6 F (37 C)] 98.4 F (36.9 C) (08/11 0600) Pulse Rate:  [30-145] 54 (08/11 0600) Resp:  [17-28] 24 (08/11 0600) BP: (72-158)/(30-124) 94/74 mmHg (08/11 0600) SpO2:  [66 %-100 %] 79 % (08/11 0600) FiO2 (%):  [40 %-50 %] 40 % (08/11 0804) Weight:  [103.5 kg (228 lb 2.8 oz)] 103.5 kg (228 lb 2.8 oz) (08/11 0500) HEMODYNAMICS: CVP:  [10 mmHg-73 mmHg] 11 mmHg VENTILATOR SETTINGS: Vent Mode:  [-] PSV;CPAP FiO2 (%):  [40 %-50 %] 40 % Set Rate:  [20 bmp] 20 bmp Vt Set:  [460 mL] 460 mL PEEP:  [5 cmH20] 5 cmH20 Plateau Pressure:  [15 cmH20-21 cmH20] 21 cmH20 INTAKE / OUTPUT: Intake/Output     08/10 0701 - 08/11 0700 08/11 0701 - 08/12 0700   I.V. (mL/kg) 3116.3 (30.1)     Other 25    NG/GT 30    IV Piggyback 200    Total Intake(mL/kg) 3371.3 (32.6)    Urine (mL/kg/hr) 448 (0.2)    Emesis/NG output 300 (0.1)    Other 3634 (1.5)    Total Output 4382     Net -1010.8            PHYSICAL EXAMINATION: General:  Obese female, critically ill  Neuro:  Sedated, A o x 4, nonfocal, rass 1 HEENT:  Mm dry, no JVD Cardiovascular:  s1s2 tachy, distant  Lungs:  coarse  Abdomen:  Obese, soft, non tender Ext: 1+ BLE edema   LABS:  CBC Recent Labs     06/04/13  1234  06/05/13  0420  06/06/13  0400  WBC  32.9*  26.2*  17.8*  HGB  12.7  10.8*  9.4*  HCT  40.8  32.1*  27.7*  PLT  273  152  106*   Coag's Recent Labs     06/04/13  1325  06/05/13  0420  06/06/13  0400  APTT   --   47*  40*  INR  1.63*   --    --    BMET Recent Labs     06/05/13  0420  06/05/13  1600  06/06/13  0400  NA  137  136  138  K  4.1  3.9  3.5  CL  102  101  101  CO2  23  28  30   BUN  19  15  10   CREATININE  1.57*  1.33*  1.01  GLUCOSE  110*  101*  129*   Electrolytes Recent Labs     06/04/13  1234  06/04/13  1600  06/05/13  0420  06/05/13  1600  06/06/13  0400  CALCIUM  6.8*  6.4*  7.0*  7.0*  7.2*  MG  1.4*   --   1.8   --   2.2  PHOS  5.1*  3.9   --   1.5*  1.0*   Sepsis Markers Recent Labs     06/04/13  1235  PROCALCITON  >175.00   ABG Recent Labs     06/04/13  1712  06/04/13  2348  06/05/13  0425  PHART  7.153*  7.298*  7.392  PCO2ART  38.1  34.9*  38.3  PO2ART  107.0*  168.0*  88.8   Liver Enzymes Recent Labs     06/04/13  1234  06/04/13  1600  06/05/13  1600  06/06/13  0400  AST  74*   --    --    --   ALT  30   --    --    --   ALKPHOS  63   --    --    --   BILITOT  0.6   --    --    --   ALBUMIN  2.4*  2.0*  1.8*  1.8*   Cardiac Enzymes Recent Labs     06/04/13  1235   06/05/13  1555  06/05/13  2313  06/06/13  0400  TROPONINI  2.44*   < >  4.31*  4.14*  3.58*  PROBNP  22349.0*   --    --    --    --    < > = values  in this interval not displayed.   Glucose Recent Labs     06/05/13  1952  06/05/13  2046  06/05/13  2348  06/06/13  0020  06/06/13  0346  06/06/13  0441  GLUCAP  105*  98  56*  86  65*  97    Imaging Ir Perc Nephrostomy Right  06/05/2013   *RADIOLOGY REPORT*   Clinical data: Clinical evidence of sepsis requiring intubation after attempted cystoscopic right renal decompression and stenting.  RIGHT PERCUTANEOUS NEPHROSTOMY CATHETER PLACEMENT UNDER ULTRASOUND AND FLUOROSCOPIC GUIDANCE  Technique: The procedure, risks (including but not limited to bleeding, infection, organ damage.  ), benefits, and alternatives were explained to the daughter, as the patient was intubated and unable to provide consent.  Questions regarding the procedure were encouraged and answered.  The daughter understands and consents to the procedure. The rightflank region prepped with Betadine, draped in usual sterile fashion, infiltrated locally with 1% lidocaine.The patient was already receiving adequate antibiotic coverage.  Intravenous Fentanyl and Versed were administered as conscious sedation during continuous cardiorespiratory monitoring by the radiology RN, with a total moderate sedation time of 20 minutes.   Under real-time ultrasound guidance, a 21-gauge trocar needle was advanced into a posterior lower pole calyx. Ultrasound image documentation was saved. Urine spontaneously returned through the needle. Needle was exchanged over a guidewire for transitional dilator. Contrast injection confirmed appropriate positioning. Catheter was exchanged over a guidewire for a 10 French pigtail catheter, formed centrally within the right renal collecting  system. Contrast injection confirms appropriate positioning and patency. Catheter   secured externally with 0 Prolene suture and placed to external drain bag. A 20 ml aspirate of the cloudy urine returned through the catheter was sent for Gram stain, culture and sensitivity.  No  immediate complication.  Fluoroscopy time: 19seconds  IMPRESSION Technically successful right percutaneous nephrostomy catheter placement.   Original Report Authenticated By: D. Wallace Going, MD   Ir US Guide Bx Asp/drain  06/05/2013   *RADIOLOGY REPORT*   Clinical data: Clinical evidence of sepsis requiring intubation after attempted cystoscopic right renal decompression and stenting.  RIGHT PERCUTANEOUS NEPHROSTOMY CATHETER PLACEMENT UNDER ULTRASOUND AND FLUOROSCOPIC GUIDANCE  Technique: The procedure, risks (including but not limited to bleeding, infection, organ damage.  ), benefits, and alternatives were explained to the daughter, as the patient was intubated and unable to provide consent.  Questions regarding the procedure were encouraged and answered.  The daughter understands and consents to the procedure. The rightflank region prepped with Betadine, draped in usual sterile fashion, infiltrated locally with 1% lidocaine.The patient was already receiving adequate antibiotic coverage.  Intravenous Fentanyl and Versed were administered as conscious sedation during continuous cardiorespiratory monitoring by the radiology RN, with a total moderate sedation time of 20 minutes.   Under real-time ultrasound guidance, a 21-gauge trocar needle was advanced into a posterior lower pole calyx. Ultrasound image documentation was saved. Urine spontaneously returned through the needle. Needle was exchanged over a guidewire for transitional dilator. Contrast injection confirmed appropriate positioning. Catheter was exchanged over a guidewire for a 10 French pigtail catheter, formed centrally within the right renal collecting system. Contrast injection confirms appropriate positioning and patency. Catheter   secured externally with 0 Prolene suture and placed to external drain bag. A 20 ml aspirate of the cloudy urine returned through the catheter was sent for Gram stain, culture and sensitivity.  No immediate complication.   Fluoroscopy time: 19seconds  IMPRESSION Technically successful right percutaneous nephrostomy catheter placement.   Original Report Authenticated By: D. Wallace Going, MD   Dg Chest Port 1 View  06/06/2013   *RADIOLOGY REPORT*  Clinical Data: Evaluate endotracheal tube placement and line placement.  PORTABLE CHEST - 1 VIEW  Comparison: 06/05/2013.  Findings: An endotracheal tube is in place with tip 4.7 cm above the carina. There is a left-sided internal jugular central venous catheter with tip terminating in the mid superior vena cava. There is a right-sided internal jugular central venous catheter with tip terminating in the mid superior vena cava. A nasogastric tube is seen extending into the stomach, however, the tip of the nasogastric tube extends below the lower margin of the image.  Lung volumes are slightly low.  Persistent left basilar opacity may reflect atelectasis and/or consolidation in the left lower lobe with superimposed small left pleural effusion.  Mild cephalization of the pulmonary vasculature, without frank pulmonary edema. Subsegmental atelectasis in the right mid to upper lung.  Heart size is normal.  Atherosclerosis in the thoracic aorta. The patient is rotated to the left on today's exam, resulting in distortion of the mediastinal contours and reduced diagnostic sensitivity and specificity for mediastinal pathology.  IMPRESSION: 1.  Support apparatus, as above. 2.  Pulmonary venous congestion, without frank pulmonary edema. 3.  Atelectasis and/or consolidation in the left lower lobe with a superimposed small left pleural effusion.   Original Report Authenticated By: Vinnie Langton, M.D.   Dg Chest Port 1 View  06/05/2013   *RADIOLOGY REPORT*  Clinical Data: Respiratory failure  PORTABLE CHEST - 1 VIEW  Comparison: 06/04/2013  Findings: Endotracheal tube is approximately 6 cm above the carina. Right jugular catheter tip in the SVC.  Left jugular catheter tip in the mid SVC.  Negative  for pneumothorax.  Left lower lobe atelectasis and effusion unchanged.  Negative for pulmonary edema.  IMPRESSION: No significant change.  Left lower lobe consolidation and left effusion are stable.   Original Report Authenticated By: Carl Best, M.D.   Dg Chest Port 1 View  06/04/2013   *RADIOLOGY REPORT*  Clinical Data: Sepsis, renal failure.  PORTABLE CHEST - 1 VIEW  Comparison: Earlier film of the same day  Findings: Endotracheal tube, nasogastric tube, and left IJ central line are stable in position.  New right IJ dialysis catheter extends to the proximal SVC.  No pneumothorax.  Persistent left lower lung consolidation / atelectasis with adjacent effusion. Patchy perihilar interstitial infiltrates bilaterally as before.  IMPRESSION:  1.  Right IJ dialysis catheter placement to the proximal SVC, no pneumothorax.   Original Report Authenticated By: D. Wallace Going, MD   Dg Chest Port 1 View  06/04/2013   *RADIOLOGY REPORT*  Clinical Data: Endotracheal tube placement  PORTABLE CHEST - 1 VIEW  Comparison: 06/04/2013  Findings: Endotracheal tube is 2.5 cm above the carina.  Left jugular catheter tip in the SVC unchanged.  No pneumothorax.  NG tube enters the stomach.  Bibasilar atelectasis again noted.  Vascular congestion and mild edema appears improved.  IMPRESSION: Endotracheal tube remains in good position.  Bibasilar atelectasis unchanged.  Improvement in pulmonary edema.   Original Report Authenticated By: Carl Best, M.D.     CXR:  Pending   ASSESSMENT / PLAN:  PULMONARY Acute respiratory failure - in setting septic shock  Possible LLL PNA P:   Last abg reviewed, maintain current MV Wean this am cpap5 ps 5, goal 30 min, assess rsbi pcx rin am, re assess LLL Replace phos  CARDIOVASCULAR Elevated troponin - likely demand ischemia Type 2 NSTEMI + ARF Septic shock  Fib rvr P:  Echo assessment required Consider asa when bloody output in mproved Add beta blocker when able Ensure tsh  done Hypotension borderline, add amio  RENAL Acute renal failure  R hydronephrosis  Pyelonephritis  R Ureteral stricture - failed ureteral stent placement 8/7 UTI Low phos P:   Chem in am  S/p perc nephrostomy cvvhd off Monitor for post obstructive increase in output \\may  need cvp  GASTROINTESTINAL No active issue  P:   Hold  TF 8/10 for weaning Add zofran ppi  HEMATOLOGIC Ndvt prev, dilutional changes likely P:  Cbc  Sub Q heparin   INFECTIOUS R Pyelonephritis GNR bacteremia (cx from [place]) - e coli P:   Follow sens , then narrow  ENDOCRINE Hyperglycemia - not known DM   P:   SSI, controlle don d10 , improved  NEUROLOGIC AMS - in setting sepsis P:   Intermittent sedation while on vent  WUA ]chair position  I have personally obtained a history, examined the patient, evaluated laboratory and imaging results, formulated the assessment and plan and placed orders. CRITICAL CARE: The patient is critically ill with multiple organ systems failure and requires high complexity decision making for assessment and support, frequent evaluation and titration of therapies, application of advanced monitoring technologies and extensive interpretation of multiple databases. Critical Care Time devoted to patient care services described in this note is 30 minutes.    *Care during the described time interval was provided by me  and/or other providers on the critical care team. I have reviewed this patient's available data, including medical history, events of note, physical examination and test results as part of my evaluation.   Lavon Paganini. Titus Mould, MD, [place] Pgr: [facility]

## 2013-06-06 NOTE — Procedures (Signed)
Extubation Procedure Note  Patient Details:   Name: [name] [name] DOB: 1941/01/12 MRN: [id number]   Airway Documentation:     Evaluation  O2 sats: stable throughout Complications: No apparent complications Patient did tolerate procedure well. Bilateral Breath Sounds: Clear;Diminished Suctioning: Airway Yes  Gonzella Lex 06/06/2013, 12:19 PM

## 2013-06-06 NOTE — Progress Notes (Signed)
CRRT stopped at 0645 per MD Justin Mend at bedside.   [name] [name]

## 2013-06-07 ENCOUNTER — Inpatient Hospital Stay (HOSPITAL_COMMUNITY): Payer: Medicare Other

## 2013-06-07 ENCOUNTER — Encounter (HOSPITAL_COMMUNITY): Payer: Self-pay | Admitting: Physician Assistant

## 2013-06-07 DIAGNOSIS — I214 Non-ST elevation (NSTEMI) myocardial infarction: Secondary | ICD-10-CM | POA: Diagnosis present

## 2013-06-07 DIAGNOSIS — I517 Cardiomegaly: Secondary | ICD-10-CM

## 2013-06-07 DIAGNOSIS — I4891 Unspecified atrial fibrillation: Secondary | ICD-10-CM | POA: Diagnosis present

## 2013-06-07 HISTORY — DX: Non-ST elevation (NSTEMI) myocardial infarction: I21.4

## 2013-06-07 LAB — GLUCOSE, CAPILLARY
Glucose-Capillary: 100 mg/dL — ABNORMAL HIGH (ref 70–99)
Glucose-Capillary: 97 mg/dL (ref 70–99)
Glucose-Capillary: 113 mg/dL — ABNORMAL HIGH (ref 70–99)
Glucose-Capillary: 125 mg/dL — ABNORMAL HIGH (ref 70–99)
Glucose-Capillary: 110 mg/dL — ABNORMAL HIGH (ref 70–99)
Glucose-Capillary: 102 mg/dL — ABNORMAL HIGH (ref 70–99)

## 2013-06-07 LAB — BASIC METABOLIC PANEL
BUN: 16 mg/dL (ref 6–23)
CO2: 31 mEq/L (ref 19–32)
Calcium: 7.3 mg/dL — ABNORMAL LOW (ref 8.4–10.5)
Chloride: 100 mEq/L (ref 96–112)
Creatinine, Ser: 1.75 mg/dL — ABNORMAL HIGH (ref 0.50–1.10)
GFR calc Af Amer: 33 mL/min — ABNORMAL LOW (ref >90)
GFR calc non Af Amer: 28 mL/min — ABNORMAL LOW (ref >90)
Glucose, Bld: 112 mg/dL — ABNORMAL HIGH (ref 70–99)
Potassium: 3.5 mEq/L (ref 3.5–5.1)
Sodium: 137 mEq/L (ref 135–145)

## 2013-06-07 LAB — CBC WITH DIFFERENTIAL/PLATELET
Basophils Absolute: 0 10*3/uL (ref 0.0–0.1)
Basophils Relative: 0 % (ref 0–1)
Eosinophils Absolute: 0.2 10*3/uL (ref 0.0–0.7)
Eosinophils Relative: 1 % (ref 0–5)
HCT: 26 % — ABNORMAL LOW (ref 36.0–46.0)
Hemoglobin: 8.7 g/dL — ABNORMAL LOW (ref 12.0–15.0)
Lymphocytes Relative: 12 % (ref 12–46)
Lymphs Abs: 1.6 10*3/uL (ref 0.7–4.0)
MCH: 26.7 pg (ref 26.0–34.0)
MCHC: 33.5 g/dL (ref 30.0–36.0)
MCV: 79.8 fL (ref 78.0–100.0)
Monocytes Absolute: 1.3 10*3/uL — ABNORMAL HIGH (ref 0.1–1.0)
Monocytes Relative: 9 % (ref 3–12)
Neutro Abs: 10.2 10*3/uL — ABNORMAL HIGH (ref 1.7–7.7)
Neutrophils Relative %: 77 % (ref 43–77)
Platelets: 102 10*3/uL — ABNORMAL LOW (ref 150–400)
RBC: 3.26 MIL/uL — ABNORMAL LOW (ref 3.87–5.11)
RDW: 15.8 % — ABNORMAL HIGH (ref 11.5–15.5)
WBC: 13.3 10*3/uL — ABNORMAL HIGH (ref 4.0–10.5)

## 2013-06-07 LAB — COMPREHENSIVE METABOLIC PANEL
ALT: 23 U/L (ref 0–35)
AST: 47 U/L — ABNORMAL HIGH (ref 0–37)
Albumin: 1.7 g/dL — ABNORMAL LOW (ref 3.5–5.2)
Alkaline Phosphatase: 65 U/L (ref 39–117)
BUN: 17 mg/dL (ref 6–23)
CO2: 30 mEq/L (ref 19–32)
Calcium: 7.5 mg/dL — ABNORMAL LOW (ref 8.4–10.5)
Chloride: 99 mEq/L (ref 96–112)
Creatinine, Ser: 1.7 mg/dL — ABNORMAL HIGH (ref 0.50–1.10)
GFR calc Af Amer: 34 mL/min — ABNORMAL LOW (ref >90)
GFR calc non Af Amer: 29 mL/min — ABNORMAL LOW (ref >90)
Glucose, Bld: 108 mg/dL — ABNORMAL HIGH (ref 70–99)
Potassium: 3.3 mEq/L — ABNORMAL LOW (ref 3.5–5.1)
Sodium: 136 mEq/L (ref 135–145)
Total Bilirubin: 0.3 mg/dL (ref 0.3–1.2)
Total Protein: 4.6 g/dL — ABNORMAL LOW (ref 6.0–8.3)

## 2013-06-07 LAB — PHOSPHORUS: Phosphorus: 1.9 mg/dL — ABNORMAL LOW (ref 2.3–4.6)

## 2013-06-07 LAB — MAGNESIUM: Magnesium: 2.3 mg/dL (ref 1.5–2.5)

## 2013-06-07 LAB — APTT: aPTT: 29 seconds (ref 24–37)

## 2013-06-07 MED ORDER — HEPARIN (PORCINE) IN NACL 100-0.45 UNIT/ML-% IJ SOLN
1200.0000 [IU]/h | INTRAMUSCULAR | Status: DC
  Administered 2013-06-07: 950 [IU]/h via INTRAVENOUS
  Administered 2013-06-08: 1200 [IU]/h via INTRAVENOUS
  Filled 2013-06-07 (×2): qty 250

## 2013-06-07 MED ORDER — POTASSIUM PHOSPHATE DIBASIC 3 MMOLE/ML IV SOLN
10.0000 mmol | Freq: Once | INTRAVENOUS | Status: AC
  Administered 2013-06-07: 10 mmol via INTRAVENOUS
  Filled 2013-06-07: qty 3.33

## 2013-06-07 MED ORDER — CEFTRIAXONE SODIUM 2 G IJ SOLR
2.0000 g | INTRAMUSCULAR | Status: AC
  Administered 2013-06-07 – 2013-06-14 (×8): 2 g via INTRAVENOUS
  Filled 2013-06-07 (×9): qty 2

## 2013-06-07 MED ORDER — AMITRIPTYLINE HCL 25 MG PO TABS
25.0000 mg | ORAL_TABLET | Freq: Every day | ORAL | Status: DC
  Administered 2013-06-07 – 2013-06-15 (×9): 25 mg via ORAL
  Filled 2013-06-07 (×10): qty 1

## 2013-06-07 MED FILL — Heparin Sodium (Porcine) 100 Unt/ML in Sodium Chloride 0.45%: INTRAMUSCULAR | Qty: 250 | Status: CN

## 2013-06-07 MED FILL — Midazolam HCl Inj 5 MG/5ML (Base Equivalent): INTRAMUSCULAR | Qty: 5 | Status: AC

## 2013-06-07 MED FILL — Fentanyl Citrate Inj 0.05 MG/ML: INTRAMUSCULAR | Qty: 2 | Status: AC

## 2013-06-07 NOTE — Progress Notes (Addendum)
Subjective: Patient is tired, denies nausea. She is having diarrhea which has been since admission.   Objective: Vital signs in last 24 hours: Filed Vitals:   06/07/13 0400 06/07/13 0500 06/07/13 0600 06/07/13 0700  BP: 98/58 96/52 98/59  88/45  Pulse: 109 110 101 105  Temp: 100 F (37.8 C) 99.8 F (37.7 C) 99.4 F (37.4 C) 99.5 F (37.5 C)  TempSrc: Core (Comment)     Resp: 23 23 25 25   Height:      Weight:  229 lb 8 oz (104.1 kg)    SpO2: 100% 96% 98% 100%   Weight change: 1 lb 5.2 oz (0.6 kg)  Intake/Output Summary (Last 24 hours) at 06/07/13 0732 Last data filed at 06/07/13 0549  Gross per 24 hour  Intake 2532.4 ml  Output   1760 ml  Net  772.4 ml   Vitals reviewed. General: resting in bed, NAD, alert and oriented x 3  HEENT: [place]/at, no scleral icterus Cardiac: atrial fibrillation, no murmurs  Pulm: clear to auscultation bilaterally, no wheezes, rales, or rhonchi Abd/GU: soft, nontender, nondistended, BS present, right nephrostomy tube intact Ext: warm and well perfused, no pedal edema Neuro: alert and oriented X3   Lab Results: Basic Metabolic Panel:  Recent Labs Lab 06/06/13 0400 06/07/13 0400  NA 138 136  K 3.5 3.3*  CL 101 99  CO2 30 30  GLUCOSE 129* 108*  BUN 10 17  CREATININE 1.01 1.70*  CALCIUM 7.2* 7.5*  MG 2.2 2.3  PHOS 1.0* 1.9*   Liver Function Tests: CBC:  Recent Labs Lab 06/06/13 0400 06/07/13 0400  WBC 17.8* 13.3*  NEUTROABS  --  10.2*  HGB 9.4* 8.7*  HCT 27.7* 26.0*  MCV 79.4 79.8  PLT 106* 102*   Cardiac Enzymes:  Recent Labs Lab 06/05/13 1555 06/05/13 2313 06/06/13 0400  TROPONINI 4.31* 4.14* 3.58*   BNP:  Recent Labs Lab 06/04/13 1235  PROBNP 22349.0*   CBG:  Recent Labs Lab 06/06/13 0811 06/06/13 1137 06/06/13 1550 06/06/13 2001 06/06/13 2346 06/07/13 0432  GLUCAP 120* 119* 118* 123* 100* 97   Coagulation:  Recent Labs Lab 06/04/13 1325  LABPROT 18.9*  INR 1.63*   Urinalysis:  Recent  Labs Lab 06/04/13 1250  COLORURINE AMBER*  LABSPEC 1.018  PHURINE 5.0  GLUCOSEU NEGATIVE  HGBUR LARGE*  BILIRUBINUR SMALL*  KETONESUR 15*  PROTEINUR 100*  UROBILINOGEN 1.0  NITRITE NEGATIVE  LEUKOCYTESUR SMALL*   Misc. Labs: none  Micro Results: Recent Results (from the past 240 hour(s))  MRSA PCR SCREENING     Status: None   Collection Time    06/04/13 12:29 PM      Result Value Range Status   MRSA by PCR NEGATIVE  NEGATIVE Final   Comment:            The GeneXpert MRSA Assay (FDA     approved for NASAL specimens     only), is one component of a     comprehensive MRSA colonization     surveillance program. It is not     intended to diagnose MRSA     infection nor to guide or     monitor treatment for     MRSA infections.  URINE CULTURE     Status: None   Collection Time    06/04/13 12:50 PM      Result Value Range Status   Specimen Description URINE, CATHETERIZED   Final   Special Requests NONE   Final  Culture  Setup Time     Final   Value: 06/04/2013 12:50     Performed at [facility] Count     Final   Value: NO GROWTH     Performed at [facility]   Culture     Final   Value: NO GROWTH     Performed at [facility]   Report Status 06/05/2013 FINAL   Final  CULTURE, BLOOD (ROUTINE X 2)     Status: None   Collection Time    06/04/13  2:10 PM      Result Value Range Status   Specimen Description BLOOD RIGHT ARM   Final   Special Requests BOTTLES DRAWN AEROBIC ONLY 10CC   Final   Culture  Setup Time     Final   Value: 06/04/2013 16:44     Performed at [facility]   Culture     Final   Value:        BLOOD CULTURE RECEIVED NO GROWTH TO DATE CULTURE WILL BE HELD FOR 5 DAYS BEFORE ISSUING A FINAL NEGATIVE REPORT     Performed at [facility]   Report Status PENDING   Incomplete  CULTURE, BLOOD (ROUTINE X 2)     Status: None   Collection Time    06/04/13  2:55 PM      Result Value Range Status    Specimen Description BLOOD RIGHT ARM   Final   Special Requests BOTTLES DRAWN AEROBIC AND ANAEROBIC 10CC   Final   Culture  Setup Time     Final   Value: 06/04/2013 16:45     Performed at [facility]   Culture     Final   Value:        BLOOD CULTURE RECEIVED NO GROWTH TO DATE CULTURE WILL BE HELD FOR 5 DAYS BEFORE ISSUING A FINAL NEGATIVE REPORT     Performed at [facility]   Report Status PENDING   Incomplete  BODY FLUID CULTURE     Status: None   Collection Time    06/04/13  3:58 PM      Result Value Range Status   Specimen Description FLUID   Final   Special Requests NONE   Final   Gram Stain     Final   Value: WBC PRESENT,BOTH PMN AND MONONUCLEAR     FEW GRAM NEGATIVE RODS     Performed at [facility]   Culture     Final   Value: FEW ESCHERICHIA COLI     Performed at [facility]   Report Status 06/06/2013 FINAL   Final   Organism ID, Bacteria ESCHERICHIA COLI   Final   Studies/Results: Dg Chest Port 1 View  06/07/2013   *RADIOLOGY REPORT*  Clinical Data: Short of breath  PORTABLE CHEST - 1 VIEW  Comparison: 06/06/2013  Findings: Endotracheal tube has been removed.  Right jugular catheter tip in the SVC.  Left jugular catheter tip in the SVC.  NG tube has been removed.  Negative for pneumothorax.  Left lower lobe atelectasis and effusion unchanged.  Right lung remains clear.  IMPRESSION: Endotracheal tube and NG tubes have been removed.  Left lower lobe atelectasis and effusion are unchanged.   Original Report Authenticated By: Carl Best, M.D.   Dg Chest Port 1 View  06/06/2013   *RADIOLOGY REPORT*  Clinical Data: Evaluate endotracheal tube placement and line placement.  PORTABLE CHEST - 1  VIEW  Comparison: 06/05/2013.  Findings: An endotracheal tube is in place with tip 4.7 cm above the carina. There is a left-sided internal jugular central venous catheter with tip terminating in the mid superior vena cava. There is a right-sided internal  jugular central venous catheter with tip terminating in the mid superior vena cava. A nasogastric tube is seen extending into the stomach, however, the tip of the nasogastric tube extends below the lower margin of the image.  Lung volumes are slightly low.  Persistent left basilar opacity may reflect atelectasis and/or consolidation in the left lower lobe with superimposed small left pleural effusion.  Mild cephalization of the pulmonary vasculature, without frank pulmonary edema. Subsegmental atelectasis in the right mid to upper lung.  Heart size is normal.  Atherosclerosis in the thoracic aorta. The patient is rotated to the left on today's exam, resulting in distortion of the mediastinal contours and reduced diagnostic sensitivity and specificity for mediastinal pathology.  IMPRESSION: 1.  Support apparatus, as above. 2.  Pulmonary venous congestion, without frank pulmonary edema. 3.  Atelectasis and/or consolidation in the left lower lobe with a superimposed small left pleural effusion.   Original Report Authenticated By: Vinnie Langton, M.D.   Medications:  Scheduled Meds: . antiseptic oral rinse  15 mL Mouth Rinse QID  . chlorhexidine  15 mL Mouth Rinse BID  . pantoprazole (PROTONIX) IV  40 mg Intravenous QHS  . piperacillin-tazobactam (ZOSYN)  IV  3.375 g Intravenous Q8H   Continuous Infusions: . amiodarone (NEXTERONE PREMIX) 360 mg/200 mL dextrose 30 mg/hr (06/06/13 2326)  . dextrose 75 mL/hr at 06/06/13 1845   PRN Meds:.sodium chloride, fentaNYL, metoprolol, midazolam, ondansetron Assessment/Plan: 43F [facility] 8[facility]9 with septic shock, AKI, ATN, likely pyelonephritis, GNR bacteremia (found at [place]), history of metabolic and respiratory acidosis this admission, hypoxic/hypercapneic respiratory failure (status post intubation 8/9) in setting of right sided ureteral obstruction s/p stent failure, lactic acidosis (4.7 on 8/9), elevated troponin likely  2/2 demand ischemia and acute renal failure.    1. Acute renal failure/ATN: Multifactorial etiology. Received CVVHD 06/06/13 will not continue as of today urine output improved.   2. History of combined respiratory and metabolic acidosis likely 2/2 to lactic acidosis:  see #1   3. Septic shock from GU source (likely right pyelonephritis with GNR bacteremia at [facility]): status post right percutaneous nephrostomy tube for right hydronephrosis.  Body fluid culture with few E.coli from culture. Zosyn (started 8/9) changed to Rocephin.    4. Atrial fibrillation: history of and patient is in atrial fibrillation currently. On Amiodarone. CCM will add BB when can tolerate. Pending echo read  5. Right hydronephrosis and stricture: Urology following when patient recovers she will need anterograde nephrostogram with internal stent.   6. Elevated troponin likely secondary to demand ischemia and acute renal failure: Troponin level trending down. Continue to follow.  7. Acute respiratory failure: Extubated 06/06/13 doing well on 3L   8. Electrolyte abnormality: Hypokalemia 3.3. hypophosphatemia 1.9 repletion per CCM  The patient does have a current PCP ([name], MD). Outpatient urologist is Dr. Vara Guardian   LOS: 3 days   Cresenciano Genre [id number] 06/07/2013, 7:32 AM

## 2013-06-07 NOTE — Evaluation (Signed)
Physical Therapy Evaluation Patient Details Name: [name] [name] MRN: [id number] DOB: 07/15/1941 Today's Date: 06/07/2013 Time: [id number] PT Time Calculation (min): 26 min  PT Assessment / Plan / Recommendation History of Present Illness  72 yo female with hx kidney stones, frequent UTI, HTN tx 8/9 from [facility] with septic shock, acute renal failure and R pyelonephritis after failed ureteral stent placement 8/7.  S/p R perc nephrostomy 8/9. CVVHD.   Clinical Impression  Pt with limited mobility due to fatigue however only requiring min assist and anticipate quick progression with mobility. Will follow acutely to maximize mobility, independence and function to return pt to PLOF and decrease burden of care.    PT Assessment  Patient needs continued PT services    Follow Up Recommendations  Home health PT;Supervision for mobility/OOB    Does the patient have the potential to tolerate intense rehabilitation      Barriers to Discharge        Equipment Recommendations  None recommended by PT    Recommendations for Other Services OT consult   Frequency Min 3X/week    Precautions / Restrictions Precautions Precautions: Fall Precaution Comments: nephrostomy drain   Pertinent Vitals/Pain No pain sats 91-94% on RA throughout mobility HR 105-122 with rate as Afib on amiodarone      Mobility  Bed Mobility Bed Mobility: Supine to Sit Supine to Sit: 4: Min assist;HOB elevated;With rails Details for Bed Mobility Assistance: cueing for hand placement and sequence with assist to fully elevate trunk Transfers Transfers: Sit to Stand;Stand to Sit;Stand Pivot Transfers Sit to Stand: 4: Min assist;From bed;From chair/3-in-1 Stand to Sit: 4: Min assist;To chair/3-in-1 Stand Pivot Transfers: 4: Min assist Details for Transfer Assistance: assist at pelvis for stability to transfer to chair without RW then stood from chair and walked with RW to [facility] for transfer  rooms Ambulation/Gait Ambulation/Gait Assistance: 4: Min guard Ambulation Distance (Feet): 5 Feet Assistive device: Rolling walker Ambulation/Gait Assistance Details: cueing for posture and position in RW Gait Pattern: Step-through pattern;Decreased stride length Gait velocity: decreased Stairs: No    Exercises     PT Diagnosis: Difficulty walking;Generalized weakness  PT Problem List: Decreased activity tolerance;Decreased mobility;Decreased knowledge of use of DME PT Treatment Interventions: Gait training;DME instruction;Stair training;Functional mobility training;Therapeutic activities;Therapeutic exercise;Patient/family education     PT Goals(Current goals can be found in the care plan section) Acute Rehab PT Goals Patient Stated Goal: return home and make it to the beach PT Goal Formulation: With patient/family Time For Goal Achievement: 06/21/13 Potential to Achieve Goals: Good  Visit Information  Last PT Received On: 06/07/13 Assistance Needed: +1 History of Present Illness: 72 yo female with hx kidney stones, frequent UTI, HTN tx 8/9 from [facility] with septic shock, acute renal failure and R pyelonephritis after failed ureteral stent placement 8/7.  S/p R perc nephrostomy 8/9. CVVHD.        Prior [place] expects to be discharged to:: Private residence Living Arrangements: Spouse/significant other Available Help at Discharge: Family;Available 24 hours/day Type of Home: House Home Access: Stairs to enter [employer] of Steps: 2 Home Layout: One level Home Equipment: Walker - 2 wheels;Cane - single point;Shower seat Prior Function Level of Independence: Independent Communication Communication: No difficulties    Cognition  Cognition Arousal/Alertness: Awake/alert Behavior During Therapy: WFL for tasks assessed/performed Overall Cognitive Status: Within Functional Limits for tasks assessed    Extremity/Trunk  Assessment Upper Extremity Assessment Upper Extremity Assessment: Generalized weakness Lower Extremity Assessment  Lower Extremity Assessment: Generalized weakness Cervical / Trunk Assessment Cervical / Trunk Assessment: Normal   Balance    End of Session PT - End of Session Activity Tolerance: Patient limited by fatigue Patient left: in chair;with call bell/phone within reach;with nursing/sitter in room;with family/visitor present Nurse Communication: Mobility status  GP     Melford Aase 06/07/2013, 2:16 PM Elwyn Reach, [place]

## 2013-06-07 NOTE — Progress Notes (Signed)
  Echocardiogram 2D Echocardiogram has been performed.  Dandrea Widdowson 06/07/2013, 9:15 AM

## 2013-06-07 NOTE — Progress Notes (Signed)
I have seen and examined this patient and agree with the plan of care . Continue CVVHD for now . Worrisome feature appears to be worsening leukocytosis  Nairi Oswald W 06/07/2013, 10:52 AM

## 2013-06-07 NOTE — Progress Notes (Signed)
Subjective: Rt PCN placed 8/9 Better daily Off vent   Objective: Vital signs in last 24 hours: Temp:  [99.4 F (37.4 C)-100.6 F (38.1 C)] 99.5 F (37.5 C) (08/12 0700) Pulse Rate:  [28-131] 105 (08/12 0700) Resp:  [18-37] 25 (08/12 0700) BP: (76-104)/(31-68) 88/45 mmHg (08/12 0700) SpO2:  [76 %-100 %] 100 % (08/12 0700) FiO2 (%):  [4 %-40 %] 4 % (08/11 1900) Weight:  [229 lb 8 oz (104.1 kg)] 229 lb 8 oz (104.1 kg) (08/12 0500) Last BM Date: 06/06/13  Intake/Output from previous day: 08/11 0701 - 08/12 0700 In: 2532.4 [I.V.:2271.9; IV Piggyback:255.5] Out: 1760 [Urine:1760] Intake/Output this shift:    PE: Tmax 100.6; now 99.5 BP: 90s/50-60s Wbc: 13.3 (17.8) BUN/CR: 17/1.7 (10/1.01) Off vent Communicating well Site of PCN is NT; no bleeding; clean and dry Output bloody: 40 cc in bag 110 cc yesterday Cx: Ecoli  Lab Results:   Recent Labs  06/06/13 0400 06/07/13 0400  WBC 17.8* 13.3*  HGB 9.4* 8.7*  HCT 27.7* 26.0*  PLT 106* 102*   BMET  Recent Labs  06/06/13 0400 06/07/13 0400  NA 138 136  K 3.5 3.3*  CL 101 99  CO2 30 30  GLUCOSE 129* 108*  BUN 10 17  CREATININE 1.01 1.70*  CALCIUM 7.2* 7.5*   PT/INR  Recent Labs  06/04/13 1325  LABPROT 18.9*  INR 1.63*   ABG  Recent Labs  06/04/13 2348 06/05/13 0425  PHART 7.298* 7.392  HCO3 17.0* 22.9    Studies/Results: Dg Chest Port 1 View  06/07/2013   *RADIOLOGY REPORT*  Clinical Data: Short of breath  PORTABLE CHEST - 1 VIEW  Comparison: 06/06/2013  Findings: Endotracheal tube has been removed.  Right jugular catheter tip in the SVC.  Left jugular catheter tip in the SVC.  NG tube has been removed.  Negative for pneumothorax.  Left lower lobe atelectasis and effusion unchanged.  Right lung remains clear.  IMPRESSION: Endotracheal tube and NG tubes have been removed.  Left lower lobe atelectasis and effusion are unchanged.   Original Report Authenticated By: Carl Best, M.D.   Dg Chest  Port 1 View  06/06/2013   *RADIOLOGY REPORT*  Clinical Data: Evaluate endotracheal tube placement and line placement.  PORTABLE CHEST - 1 VIEW  Comparison: 06/05/2013.  Findings: An endotracheal tube is in place with tip 4.7 cm above the carina. There is a left-sided internal jugular central venous catheter with tip terminating in the mid superior vena cava. There is a right-sided internal jugular central venous catheter with tip terminating in the mid superior vena cava. A nasogastric tube is seen extending into the stomach, however, the tip of the nasogastric tube extends below the lower margin of the image.  Lung volumes are slightly low.  Persistent left basilar opacity may reflect atelectasis and/or consolidation in the left lower lobe with superimposed small left pleural effusion.  Mild cephalization of the pulmonary vasculature, without frank pulmonary edema. Subsegmental atelectasis in the right mid to upper lung.  Heart size is normal.  Atherosclerosis in the thoracic aorta. The patient is rotated to the left on today's exam, resulting in distortion of the mediastinal contours and reduced diagnostic sensitivity and specificity for mediastinal pathology.  IMPRESSION: 1.  Support apparatus, as above. 2.  Pulmonary venous congestion, without frank pulmonary edema. 3.  Atelectasis and/or consolidation in the left lower lobe with a superimposed small left pleural effusion.   Original Report Authenticated By: Vinnie Langton, M.D.  Anti-infectives: Anti-infectives   Start     Dose/Rate Route Frequency Ordered Stop   06/06/13 1800  piperacillin-tazobactam (ZOSYN) IVPB 3.375 g     3.375 g 12.5 mL/hr over 240 Minutes Intravenous 3 times per day 06/06/13 1430     06/04/13 1500  piperacillin-tazobactam (ZOSYN) IVPB 3.375 g  Status:  Discontinued     3.375 g 100 mL/hr over 30 Minutes Intravenous Every 6 hours 06/04/13 1441 06/06/13 1430   06/04/13 1400  piperacillin-tazobactam (ZOSYN) IVPB 2.25 g   Status:  Discontinued     2.25 g 100 mL/hr over 30 Minutes Intravenous 3 times per day 06/04/13 1333 06/04/13 1441      Assessment/Plan: s/p * No surgery found *  Rt PCN placed 8/9 Will follow Plan per Urology   LOS: 3 days    Evoleht Hovatter A 06/07/2013

## 2013-06-07 NOTE — Progress Notes (Signed)
[facility] ADULT ICU REPLACEMENT PROTOCOL FOR AM LAB REPLACEMENT ONLY  The patient does not apply for the [facility] Adult ICU Electrolyte Replacment Protocol based on the criteria listed below:   1. Is GFR >/= 40 ml/min? no  Patient's GFR today is 29  2. Abnormal electrolyte(s): K-3.3   George Ina 06/07/2013 5:47 AM

## 2013-06-07 NOTE — Consult Note (Signed)
CARDIOLOGY CONSULT NOTE   Patient ID: [name] [name] MRN: [id number] DOB/AGE: 71-Mar-1942 71 y.o.  Admit date: 06/04/2013  Primary Physician   [name], MD Primary Cardiologist   Dr. Agustin Cree, [facility], [facility] in [place] Reason for Consultation   A fib/NSTEMI  [id number] H [name] [name] is a 72 y.o. female with A fib, Hypertension, Hyperlipidemia, and no hx of CAD who we are asked to see for rapid atrial fibrillation and NSTEMI.   Patient was diagnosed with A fib in June 2012 when she presented for kidney infection, but she did not have sx of sob, lightheadness, or palpations. Ever since then, she has been on metoprolol and ASA. She was not on amiodarone PTA. She only has intermittent flutters that lasts <30sec with stress and anxiety, and occur less than 2x/month. She had a negative stress test in the past. At baseline she has dyspnea on exertion but does not have weakness, vision changes, palpitations, chest pain, presyncope, or nighttime awakenings.  She went to [facility] 8/8 when she developed S/S of pyelonephritis 24 hr after ureteral stent placement. She was in septic shock/ARF and transferred to [facility]. She was intubated 8/9-8/11. CVVHD was used to manage her volume/ARF. She was on pressors at first, but is now off them.   Her HR was elevated on admission but she was not started on amiodarone till she was off pressors. PRN IV metoprolol was ordered but rarely used because of BP issues.  On 06/06/13, she was started on IV amiodarone. Her HR is still elevated.   She had cardiac enzymes cycled. There was a crescendo/decrescendo pattern, now trending down. EKG does not show ST elevation and she has significant Q waves only in lead III. She was intubated on arrival, but does not remember any chest pain prior to admission.  Past Medical History  Diagnosis Date  . GERD (gastroesophageal reflux disease)   . Kidney stones   . A-fib   . HTN (hypertension)   .  Hyperlipidemia      Past Surgical History  Procedure Laterality Date  . Cystoscopy with ureteroscopy and stent placement Left   . Cystoscopy with stent placement Right   . Lumbar laminectomy      Allergies  Allergen Reactions  . Codeine Nausea Only    hallucinations  . Hydrocodone Nausea Only    hallucinations  . Macrodantin (Nitrofurantoin) Nausea And Vomiting    I have reviewed the patient's current medications . antiseptic oral rinse  15 mL Mouth Rinse QID  . cefTRIAXone (ROCEPHIN)  IV  2 g Intravenous Q24H  . chlorhexidine  15 mL Mouth Rinse BID  . pantoprazole (PROTONIX) IV  40 mg Intravenous QHS  . potassium phosphate IVPB (mmol)  10 mmol Intravenous Once   . amiodarone (NEXTERONE PREMIX) 360 mg/200 mL dextrose 30 mg/hr (06/07/13 1100)  . dextrose 75 mL/hr at 06/06/13 1845   sodium chloride, fentaNYL, metoprolol, midazolam, ondansetron  Prior to Admission medications   Medication Sig Start Date End Date Taking? Authorizing Provider  amitriptyline (ELAVIL) 25 MG tablet Take 25 mg by mouth at bedtime.   Yes Historical Provider, MD  aspirin EC 325 MG tablet Take 325 mg by mouth daily.   Yes Historical Provider, MD  fenofibrate micronized (LOFIBRA) 134 MG capsule Take 134 mg by mouth daily before breakfast.   Yes Historical Provider, MD  metoprolol tartrate (LOPRESSOR) 25 MG tablet Take 25 mg by mouth at bedtime.   Yes Historical Provider, MD  omeprazole (PRILOSEC) 20  MG capsule Take 20 mg by mouth daily.   Yes Historical Provider, MD  ondansetron (ZOFRAN) 4 MG tablet Take 4 mg by mouth every 12 (twelve) hours as needed for nausea.   Yes Historical Provider, MD  sulfamethoxazole-trimethoprim (BACTRIM DS) 800-160 MG per tablet Take 1 tablet by mouth 2 (two) times daily.   Yes Historical Provider, MD     History   Social History  . Marital Status: Married    Spouse Name: N/A    Number of Children: N/A  . Years of Education: N/A   Occupational History  . Retired from  [employer]    Social History Main Topics  . Smoking status: Never Smoker   . Smokeless tobacco: Not on file  . Alcohol Use: No  . Drug Use: No  . Sexually Active: Not on file   Other Topics Concern  . Not on file   Social History Narrative   Lives in [place] with husband. Drives, walks some, but not very active at home.     Family Status  Relation Status Death Age  . Mother Deceased 67s    Hx CHF and CA  . Father Deceased 26s    Hx CABG 37s  . Sister Deceased     No CAD  . Sister Alive     No CAD  . Brother Alive     No CAD  . Brother Alive     No CAD    ROS:  Full 14 point review of systems complete and found to be negative unless listed above.  Physical Exam: Blood pressure 116/73, pulse 102, temperature 98.8 F (37.1 C), temperature source Core (Comment), resp. rate 25, height 5\' 5"  (1.651 m), weight 229 lb 8 oz (104.1 kg), SpO2 98.00%.  General: Well developed, well nourished, female in no acute distress, very weak Head: Eyes PERRL, No xanthomas.   Normocephalic and atraumatic, oropharynx without edema or exudate. Dentition: good  Lungs: wheezing at the bases of the lungs Heart:  Heart irregular rate and rhythm with S1, S2; no murmur. pulses are 2+ all 4 extrem.   Neck: No carotid bruits. No lymphadenopathy.  JVD 9 cm. Abdomen: Bowel sounds present, abdomen soft and non-tender without masses or hernias noted. Msk:  No spine or cva tenderness. No weakness, no joint deformities or effusions. Extremities: No clubbing or cyanosis.  Fingers are slightly swollen. Good pulses in all extremities. No pitting edema. Neuro: Sleepy but appropriate when roused, and oriented X 3. No focal deficits noted. Psych:  Good affect, responds appropriately Skin: No rashes or lesions noted.  Labs:   Lab Results  Component Value Date   WBC 13.3* 06/07/2013   HGB 8.7* 06/07/2013   HCT 26.0* 06/07/2013   MCV 79.8 06/07/2013   PLT 102* 06/07/2013    Recent Labs  06/04/13 1325  INR  1.63*     Recent Labs Lab 06/07/13 0400  NA 136  K 3.3*  CL 99  CO2 30  BUN 17  CREATININE 1.70*  CALCIUM 7.5*  PROT 4.6*  BILITOT 0.3  ALKPHOS 65  ALT 23  AST 47*  GLUCOSE 108*   Magnesium  Date Value Range Status  06/07/2013 2.3  1.5 - 2.5 mg/dL Final   Troponin I  Date/Time Value Range Status  06/06/2013  4:00 AM 3.58* <0.30 ng/mL Final  06/05/2013 11:13 PM 4.14* <0.30 ng/mL Final  06/05/2013  3:55 PM 4.31* <0.30 ng/mL Final  06/05/2013 12:35 AM 5.06* <0.30 ng/mL Final  06/04/2013  6:35 PM 3.89* <0.30 ng/mL Final  06/04/2013 12:35 PM 2.44* <0.30 ng/mL Final   Pro B Natriuretic peptide (BNP)  Date/Time Value Range Status  06/04/2013 12:35 PM 22349.0* 0 - 125 pg/mL Final   TSH  Date/Time Value Range Status  06/06/2013  9:59 AM 1.876  0.350 - 4.500 uIU/mL Final     Performed at [facility]   Echo: pending  ECG:  06/06/2013 Atrial fib, RVR Vent. rate 125 BPM PR interval * ms QRS duration 90 ms QT/QTc 330/476 ms P-R-T axes * 15 10  Radiology:  Dg Chest Port 1 View 06/07/2013   *RADIOLOGY REPORT*  Clinical Data: Short of breath  PORTABLE CHEST - 1 VIEW  Comparison: 06/06/2013  Findings: Endotracheal tube has been removed.  Right jugular catheter tip in the SVC.  Left jugular catheter tip in the SVC.  NG tube has been removed.  Negative for pneumothorax.  Left lower lobe atelectasis and effusion unchanged.  Right lung remains clear.  IMPRESSION: Endotracheal tube and NG tubes have been removed.  Left lower lobe atelectasis and effusion are unchanged.   Original Report Authenticated By: Carl Best, M.D.   Dg Chest Port 1 View 06/06/2013   *RADIOLOGY REPORT*  Clinical Data: Evaluate endotracheal tube placement and line placement.  PORTABLE CHEST - 1 VIEW  Comparison: 06/05/2013.  Findings: An endotracheal tube is in place with tip 4.7 cm above the carina. There is a left-sided internal jugular central venous catheter with tip terminating in the mid superior vena cava.  There is a right-sided internal jugular central venous catheter with tip terminating in the mid superior vena cava. A nasogastric tube is seen extending into the stomach, however, the tip of the nasogastric tube extends below the lower margin of the image.  Lung volumes are slightly low.  Persistent left basilar opacity may reflect atelectasis and/or consolidation in the left lower lobe with superimposed small left pleural effusion.  Mild cephalization of the pulmonary vasculature, without frank pulmonary edema. Subsegmental atelectasis in the right mid to upper lung.  Heart size is normal.  Atherosclerosis in the thoracic aorta. The patient is rotated to the left on today's exam, resulting in distortion of the mediastinal contours and reduced diagnostic sensitivity and specificity for mediastinal pathology.  IMPRESSION: 1.  Support apparatus, as above. 2.  Pulmonary venous congestion, without frank pulmonary edema. 3.  Atelectasis and/or consolidation in the left lower lobe with a superimposed small left pleural effusion.   Original Report Authenticated By: Vinnie Langton, M.D.    ASSESSMENT AND PLAN:   The patient was seen today by Dr. Acie Fredrickson, the patient evaluated and the data reviewed.    Atrial fibrillation with rapid ventricular response - agree with rate control with amio +/- BB as BP will allow. Records pending from primary cardiologist.    NSTEMI, initial episode of care - stress test previously negative, no hx chest pain. Possibly Type 2 in the setting of acute illness. Currently not on ASA (anemia), BB (low BP) or statin.   Active Problems:   Septic shock(785.52)   Acute respiratory failure   Pyelonephritis   Ureteral obstruction, right   Acute renal failure   Lactic acidosis   Signed:  [place], PA-C 06/07/2013 1:18 PM Beeper [id number]  Co-Sign MD   Attending Note:   The patient was seen and examined.  Agree with assessment and plan as noted above.   Changes made to the above note as needed.  Pt presents with recurrent AF with RVR and NSTEMI in the setting of urosepsis, hypotension, pressors.  She is completely pain free now.    Echo results just returned and her LV EF is mildly depressed with EF of 45-50%.  She had no regional wall motion abnormalities.   I think we can DC her without any further cardiac evaluation.  She has had PAF in the past and her primary cardiologist had her on ASA.  I agree that she may not need chronic anticoagulation at this point but we could consider short term coumadin.   One could make the argument that she be placed on anticoagulation so that her primary cardiologist can do a cardioversion if she remains in AF.  Her renal function has decreased and I would hesitate to use a novel anticoagulant agent.   I would start heparin on her and follow her platelet counts and Hb closely.    I do not think that she necessarily needs a cath at this point.  If she has any episodes of CP then I would refer her for cath.  She should be ambulated to make sure that she is able to do her normal activities without problems.   Thayer Headings, Brooke Bonito., MD, [facility] 06/07/2013, 2:18 PM

## 2013-06-07 NOTE — Progress Notes (Signed)
ANTICOAGULATION CONSULT NOTE - Initial Consult  Pharmacy Consult for heparin Indication: chest pain/ACS and atrial fibrillation  Allergies  Allergen Reactions  . Codeine Nausea Only    hallucinations  . Hydrocodone Nausea Only    hallucinations  . Macrodantin (Nitrofurantoin) Nausea And Vomiting    Patient Measurements: Height: 5\' 7"  (170.2 cm) Weight: 227 lb 4.7 oz (103.1 kg) IBW/kg (Calculated) : 61.6 Heparin Dosing Weight: 80kg  Vital Signs: Temp: 99.2 F (37.3 C) (08/12 1440) Temp src: Oral (08/12 1440) BP: 106/68 mmHg (08/12 1440) Pulse Rate: 115 (08/12 1403)  Labs:  Recent Labs  06/05/13 0420 06/05/13 1555 06/05/13 1600 06/05/13 2313 06/06/13 0400 06/07/13 0400  HGB 10.8*  --   --   --  9.4* 8.7*  HCT 32.1*  --   --   --  27.7* 26.0*  PLT 152  --   --   --  106* 102*  APTT 47*  --   --   --  40* 29  CREATININE 1.57*  --  1.33*  --  1.01 1.70*  TROPONINI  --  4.31*  --  4.14* 3.58*  --     Estimated Creatinine Clearance: 37.5 ml/min (by C-G formula based on Cr of 1.7).   Medical History: Past Medical History  Diagnosis Date  . GERD (gastroesophageal reflux disease)   . Kidney stones   . A-fib   . HTN (hypertension)   . Hyperlipidemia    Assessment: 72 yo female with hx kidney stones, frequent UTI, HTN tx 8/9 from [facility] with septic shock, acute renal failure and R pyelonephritis after failed ureteral stent placement 8/7. S/p R perc nephrostomy 8/9. Currently on CVVHD.  New orders received for IV heparin for nstemi/afib with possible plans of cardioversion once anticoagulated.   Note that sq heparin was stopped due to dramatic platelet drop 273>>152>>106>>102. Blood still present in nephrostomy so will decline heparin bolus and dose conservatively. If platelets continue to drop may need to consider HIT/direct thrombin inhibitor  Goal of Therapy:  Heparin level 0.3-0.7 units/ml Monitor platelets by anticoagulation protocol: Yes   Plan:   Start heparin infusion at 950 units/hr Check anti-Xa level in 8 hours and daily while on heparin Continue to monitor H&H and platelets closely  Erin Hearing PharmD., BCPS Clinical Pharmacist Pager [phone] 06/07/2013 3:05 PM

## 2013-06-07 NOTE — Progress Notes (Signed)
PULMONARY  / CRITICAL CARE MEDICINE  Name: [name] [name] MRN: [id number] DOB: Mar 17, 1941    ADMISSION DATE:  06/04/2013  REFERRING MD :  [facility]  PRIMARY SERVICE: PCCM   CHIEF COMPLAINT:  Shock, renal failure   BRIEF PATIENT DESCRIPTION: 72 yo female with hx kidney stones, frequent UTI, HTN tx 8/9 from [facility] with septic shock, acute renal failure and R pyelonephritis after failed ureteral stent placement 8/7.  S/p R perc nephrostomy 8/9. CVVHD.   SIGNIFICANT EVENTS / STUDIES:  CT abd/pelvis 8/9>>> mod to severe R hydronephrosis with R sided perinephric stranding and fluid.  Dilation of the ureter to the L of the R hemipelvis, without evidence of a distal obstructing stone.  Pyelonephritis cannot be excluded.  Left renal cyst.   LINES / TUBES: L IJ CVL 8/9>>> ETT 8/9>>>8/11 R IJ HD cath 8/9 >>   CULTURES: Blood 8/9 [facility]) >> GNR >> E coli Urine 8/9>>> BCx2 8/9>>> R nephrostomy drainage 8/9 >> few e coli  ANTIBIOTICS: Linezolid x1 8/9 Imipenem x1 8/9 Zosyn 8/9 >>>8/12 8/11 ceftriaxone>>>consider 10 days  Interval events:  R nephrostomy placed by Dr Levada Schilling weaned to off CVVHD tolerated, no heparin  Overnight: Extubated, controlled rate with amio  VITAL SIGNS: Temp:  [98.8 F (37.1 C)-100.6 F (38.1 C)] 98.8 F (37.1 C) (08/12 0900) Pulse Rate:  [28-125] 100 (08/12 0900) Resp:  [18-37] 24 (08/12 0900) BP: (76-104)/(31-68) 100/54 mmHg (08/12 0900) SpO2:  [76 %-100 %] 98 % (08/12 0900) FiO2 (%):  [4 %-40 %] 4 % (08/11 1900) Weight:  [104.1 kg (229 lb 8 oz)] 104.1 kg (229 lb 8 oz) (08/12 0500) HEMODYNAMICS: CVP:  [8 mmHg-86 mmHg] 48 mmHg VENTILATOR SETTINGS: Vent Mode:  [-] PSV;CPAP FiO2 (%):  [4 %-40 %] 4 % PEEP:  [8 cmH20] 8 cmH20 INTAKE / OUTPUT: Intake/Output     08/11 0701 - 08/12 0700 08/12 0701 - 08/13 0700   I.V. (mL/kg) 2363.6 (22.7) 91.7 (0.9)   Other 5    NG/GT     IV Piggyback 268 12.5   Total Intake(mL/kg)  2636.6 (25.3) 104.2 (1)   Urine (mL/kg/hr) 1760 (0.7) 300 (1.2)   Emesis/NG output     Other     Total Output 1760 300   Net +876.6 -195.8          PHYSICAL EXAMINATION: General:  Obese female Neuro:  A o x 4, nonfocal HEENT:  Mm dry, no JVD Cardiovascular:  s1s2 tachy mild 105 , distant  Lungs:  coarse resolved Abdomen:  Obese, soft, non tender Ext: 1+ BLE edema   LABS:  CBC Recent Labs     06/05/13  0420  06/06/13  0400  06/07/13  0400  WBC  26.2*  17.8*  13.3*  HGB  10.8*  9.4*  8.7*  HCT  32.1*  27.7*  26.0*  PLT  152  106*  102*   Coag's Recent Labs     06/04/13  1325  06/05/13  0420  06/06/13  0400  06/07/13  0400  APTT   --   47*  40*  29  INR  1.63*   --    --    --    BMET Recent Labs     06/05/13  1600  06/06/13  0400  06/07/13  0400  NA  136  138  136  K  3.9  3.5  3.3*  CL  101  101  99  CO2  28  30  30   BUN  15  10  17   CREATININE  1.33*  1.01  1.70*  GLUCOSE  101*  129*  108*   Electrolytes Recent Labs     06/05/13  0420  06/05/13  1600  06/06/13  0400  06/07/13  0400  CALCIUM  7.0*  7.0*  7.2*  7.5*  MG  1.8   --   2.2  2.3  PHOS   --   1.5*  1.0*  1.9*   Sepsis Markers Recent Labs     06/04/13  1235  PROCALCITON  >175.00   ABG Recent Labs     06/04/13  1712  06/04/13  2348  06/05/13  0425  PHART  7.153*  7.298*  7.392  PCO2ART  38.1  34.9*  38.3  PO2ART  107.0*  168.0*  88.8   Liver Enzymes Recent Labs     06/04/13  1234   06/05/13  1600  06/06/13  0400  06/07/13  0400  AST  74*   --    --    --   47*  ALT  30   --    --    --   23  ALKPHOS  63   --    --    --   65  BILITOT  0.6   --    --    --   0.3  ALBUMIN  2.4*   < >  1.8*  1.8*  1.7*   < > = values in this interval not displayed.   Cardiac Enzymes Recent Labs     06/04/13  1235   06/05/13  1555  06/05/13  2313  06/06/13  0400  TROPONINI  2.44*   < >  4.31*  4.14*  3.58*  PROBNP  22349.0*   --    --    --    --    < > = values in this  interval not displayed.   Glucose Recent Labs     06/06/13  1137  06/06/13  1550  06/06/13  2001  06/06/13  2346  06/07/13  0432  06/07/13  0814  GLUCAP  119*  118*  123*  100*  97  113*    Imaging Dg Chest Port 1 View  06/07/2013   *RADIOLOGY REPORT*  Clinical Data: Short of breath  PORTABLE CHEST - 1 VIEW  Comparison: 06/06/2013  Findings: Endotracheal tube has been removed.  Right jugular catheter tip in the SVC.  Left jugular catheter tip in the SVC.  NG tube has been removed.  Negative for pneumothorax.  Left lower lobe atelectasis and effusion unchanged.  Right lung remains clear.  IMPRESSION: Endotracheal tube and NG tubes have been removed.  Left lower lobe atelectasis and effusion are unchanged.   Original Report Authenticated By: Carl Best, M.D.   Dg Chest Port 1 View  06/06/2013   *RADIOLOGY REPORT*  Clinical Data: Evaluate endotracheal tube placement and line placement.  PORTABLE CHEST - 1 VIEW  Comparison: 06/05/2013.  Findings: An endotracheal tube is in place with tip 4.7 cm above the carina. There is a left-sided internal jugular central venous catheter with tip terminating in the mid superior vena cava. There is a right-sided internal jugular central venous catheter with tip terminating in the mid superior vena cava. A nasogastric tube is seen extending into the stomach, however, the tip of the nasogastric tube extends below the lower margin of the image.  Lung volumes are slightly low.  Persistent left basilar opacity may reflect atelectasis and/or consolidation in the left lower lobe with superimposed small left pleural effusion.  Mild cephalization of the pulmonary vasculature, without frank pulmonary edema. Subsegmental atelectasis in the right mid to upper lung.  Heart size is normal.  Atherosclerosis in the thoracic aorta. The patient is rotated to the left on today's exam, resulting in distortion of the mediastinal contours and reduced diagnostic sensitivity and  specificity for mediastinal pathology.  IMPRESSION: 1.  Support apparatus, as above. 2.  Pulmonary venous congestion, without frank pulmonary edema. 3.  Atelectasis and/or consolidation in the left lower lobe with a superimposed small left pleural effusion.   Original Report Authenticated By: Vinnie Langton, M.D.     CXR:  Mild int prom  ASSESSMENT / PLAN:  PULMONARY Acute respiratory failure - in setting septic shock  Possible LLL PNA P:   IS Allow neg balance  CARDIOVASCULAR Elevated troponin - likely demand ischemia Type 2 NSTEMI + ARF Septic shock  Fib rvr P:  Echo assessment required -  pending Consider asa after bloody nephrostomy drainage improved Add beta blocker when able Ensure tsh wnl Maintain maintain untitrated, consider oral in am and dc drip No hep, bleeding drainge  RENAL Acute renal failure  R hydronephrosis -high risk post obstructive diuresis Pyelonephritis  R Ureteral stricture - failed ureteral stent placement 8/7 UTI Low phos, k  P:   Chem in am  S/p perc nephrostomy Monitor for post obstructive increase in output - bmet in pm  replace kphos  GASTROINTESTINAL No active issue  P:   Add diet , advance zofran ppi  HEMATOLOGIC Ndvt prev, dilutional changes likely P:  Cbc in am  scd Ambulation when able  INFECTIOUS R Pyelonephritis GNR bacteremia (cx from [place]) - e coli P:   Narrow to ceftriaxone  ENDOCRINE Hyperglycemia - not known DM   P:   SSI, wnl  NEUROLOGIC AMS - in setting sepsis P:   chair position- pt consult  To sdu  *Care during the described time interval was provided by me and/or other providers on the critical care team. I have reviewed this patient's available data, including medical history, events of note, physical examination and test results as part of my evaluation.   Lavon Paganini. Titus Mould, MD, [place] Pgr: [facility]

## 2013-06-08 DIAGNOSIS — I214 Non-ST elevation (NSTEMI) myocardial infarction: Secondary | ICD-10-CM

## 2013-06-08 DIAGNOSIS — B9620 Unspecified Escherichia coli [E. coli] as the cause of diseases classified elsewhere: Secondary | ICD-10-CM

## 2013-06-08 DIAGNOSIS — D649 Anemia, unspecified: Secondary | ICD-10-CM

## 2013-06-08 DIAGNOSIS — R7881 Bacteremia: Secondary | ICD-10-CM

## 2013-06-08 HISTORY — DX: Anemia, unspecified: D64.9

## 2013-06-08 HISTORY — DX: Unspecified Escherichia coli (E. coli) as the cause of diseases classified elsewhere: B96.20

## 2013-06-08 HISTORY — DX: Unspecified Escherichia coli (E. coli) as the cause of diseases classified elsewhere: R78.81

## 2013-06-08 LAB — RENAL FUNCTION PANEL
Albumin: 1.9 g/dL — ABNORMAL LOW (ref 3.5–5.2)
BUN: 17 mg/dL (ref 6–23)
CO2: 26 mEq/L (ref 19–32)
Calcium: 8 mg/dL — ABNORMAL LOW (ref 8.4–10.5)
Chloride: 101 mEq/L (ref 96–112)
Creatinine, Ser: 1.84 mg/dL — ABNORMAL HIGH (ref 0.50–1.10)
GFR calc Af Amer: 31 mL/min — ABNORMAL LOW (ref >90)
GFR calc non Af Amer: 26 mL/min — ABNORMAL LOW (ref >90)
Glucose, Bld: 117 mg/dL — ABNORMAL HIGH (ref 70–99)
Phosphorus: 2.5 mg/dL (ref 2.3–4.6)
Potassium: 3.7 mEq/L (ref 3.5–5.1)
Sodium: 138 mEq/L (ref 135–145)

## 2013-06-08 LAB — CBC WITH DIFFERENTIAL/PLATELET
Basophils Absolute: 0 10*3/uL (ref 0.0–0.1)
Basophils Relative: 0 % (ref 0–1)
Eosinophils Absolute: 0.3 10*3/uL (ref 0.0–0.7)
Eosinophils Relative: 3 % (ref 0–5)
HCT: 28.1 % — ABNORMAL LOW (ref 36.0–46.0)
Hemoglobin: 9.2 g/dL — ABNORMAL LOW (ref 12.0–15.0)
Lymphocytes Relative: 13 % (ref 12–46)
Lymphs Abs: 1.5 10*3/uL (ref 0.7–4.0)
MCH: 26.1 pg (ref 26.0–34.0)
MCHC: 32.7 g/dL (ref 30.0–36.0)
MCV: 79.8 fL (ref 78.0–100.0)
Monocytes Absolute: 2.1 10*3/uL — ABNORMAL HIGH (ref 0.1–1.0)
Monocytes Relative: 18 % — ABNORMAL HIGH (ref 3–12)
Neutro Abs: 7.5 10*3/uL (ref 1.7–7.7)
Neutrophils Relative %: 65 % (ref 43–77)
Platelets: 121 10*3/uL — ABNORMAL LOW (ref 150–400)
RBC: 3.52 MIL/uL — ABNORMAL LOW (ref 3.87–5.11)
RDW: 16.2 % — ABNORMAL HIGH (ref 11.5–15.5)
WBC: 11.4 10*3/uL — ABNORMAL HIGH (ref 4.0–10.5)

## 2013-06-08 LAB — HEPARIN LEVEL (UNFRACTIONATED)
Heparin Unfractionated: <0.1 IU/mL — ABNORMAL LOW (ref 0.30–0.70)
Heparin Unfractionated: <0.1 IU/mL — ABNORMAL LOW (ref 0.30–0.70)
Heparin Unfractionated: <0.1 IU/mL — ABNORMAL LOW (ref 0.30–0.70)

## 2013-06-08 LAB — PROTIME-INR
INR: 1.02 (ref 0.00–1.49)
Prothrombin Time: 13.2 seconds (ref 11.6–15.2)

## 2013-06-08 LAB — GLUCOSE, CAPILLARY
Glucose-Capillary: 104 mg/dL — ABNORMAL HIGH (ref 70–99)
Glucose-Capillary: 121 mg/dL — ABNORMAL HIGH (ref 70–99)
Glucose-Capillary: 101 mg/dL — ABNORMAL HIGH (ref 70–99)
Glucose-Capillary: 100 mg/dL — ABNORMAL HIGH (ref 70–99)
Glucose-Capillary: 91 mg/dL (ref 70–99)

## 2013-06-08 LAB — APTT: aPTT: 34 seconds (ref 24–37)

## 2013-06-08 MED ORDER — WARFARIN SODIUM 3 MG PO TABS
3.0000 mg | ORAL_TABLET | Freq: Once | ORAL | Status: AC
  Administered 2013-06-08: 3 mg via ORAL
  Filled 2013-06-08: qty 1

## 2013-06-08 MED ORDER — WARFARIN VIDEO
Freq: Once | Status: AC
  Administered 2013-06-12: 12:00:00

## 2013-06-08 MED ORDER — CARVEDILOL 3.125 MG PO TABS
3.1250 mg | ORAL_TABLET | Freq: Two times a day (BID) | ORAL | Status: DC
  Administered 2013-06-08 – 2013-06-15 (×14): 3.125 mg via ORAL
  Filled 2013-06-08 (×17): qty 1

## 2013-06-08 MED ORDER — HEPARIN (PORCINE) IN NACL 100-0.45 UNIT/ML-% IJ SOLN
1800.0000 [IU]/h | INTRAMUSCULAR | Status: DC
  Filled 2013-06-08 (×3): qty 250

## 2013-06-08 MED ORDER — WARFARIN - PHARMACIST DOSING INPATIENT
Freq: Every day | Status: DC
  Administered 2013-06-09 – 2013-06-12 (×3)

## 2013-06-08 MED ORDER — HEPARIN (PORCINE) IN NACL 100-0.45 UNIT/ML-% IJ SOLN
1500.0000 [IU]/h | INTRAMUSCULAR | Status: DC
  Administered 2013-06-08 (×2): 1500 [IU]/h via INTRAVENOUS
  Filled 2013-06-08 (×2): qty 250

## 2013-06-08 MED ORDER — COUMADIN BOOK
Freq: Once | Status: AC
  Administered 2013-06-08: 14:00:00
  Filled 2013-06-08: qty 1

## 2013-06-08 MED ORDER — PANTOPRAZOLE SODIUM 40 MG PO TBEC
40.0000 mg | DELAYED_RELEASE_TABLET | Freq: Every day | ORAL | Status: DC
  Administered 2013-06-08 – 2013-06-15 (×7): 40 mg via ORAL
  Filled 2013-06-08 (×7): qty 1

## 2013-06-08 NOTE — Progress Notes (Signed)
  Subjective: Urosepsis- Rt Hydro R PCN placed 8/9 Output moderate Better daily  Objective: Vital signs in last 24 hours: Temp:  [97.4 F (36.3 C)-100.3 F (37.9 C)] 98.8 F (37.1 C) (08/13 0700) Pulse Rate:  [92-117] 92 (08/13 0700) Resp:  [14-30] 20 (08/13 0700) BP: (83-134)/(54-104) 134/73 mmHg (08/13 0700) SpO2:  [94 %-100 %] 96 % (08/13 0700) Weight:  [227 lb 4.7 oz (103.1 kg)] 227 lb 4.7 oz (103.1 kg) (08/13 0450) Last BM Date: 06/07/13  Intake/Output from previous day: 08/12 0701 - 08/13 0700 In: 2307.4 [I.V.:2160.9; IV Piggyback:146.5] Out: 3695 [Urine:3595; Stool:100] Intake/Output this shift:    PE: stable; T max 100.3 Now afeb Rt PCN intact; NT; no bleeding Output 270 cc yesterday: bloody              50 cc in bag- bloody   Lab Results:   Recent Labs  06/06/13 0400 06/07/13 0400  WBC 17.8* 13.3*  HGB 9.4* 8.7*  HCT 27.7* 26.0*  PLT 106* 102*   BMET  Recent Labs  06/07/13 0400 06/07/13 1800  NA 136 137  K 3.3* 3.5  CL 99 100  CO2 30 31  GLUCOSE 108* 112*  BUN 17 16  CREATININE 1.70* 1.75*  CALCIUM 7.5* 7.3*   PT/INR No results found for this basename: LABPROT, INR,  in the last 72 hours ABG No results found for this basename: PHART, PCO2, PO2, HCO3,  in the last 72 hours  Studies/Results: Dg Chest Port 1 View  06/07/2013   *RADIOLOGY REPORT*  Clinical Data: Short of breath  PORTABLE CHEST - 1 VIEW  Comparison: 06/06/2013  Findings: Endotracheal tube has been removed.  Right jugular catheter tip in the SVC.  Left jugular catheter tip in the SVC.  NG tube has been removed.  Negative for pneumothorax.  Left lower lobe atelectasis and effusion unchanged.  Right lung remains clear.  IMPRESSION: Endotracheal tube and NG tubes have been removed.  Left lower lobe atelectasis and effusion are unchanged.   Original Report Authenticated By: Carl Best, M.D.    Anti-infectives: Anti-infectives   Start     Dose/Rate Route Frequency Ordered Stop    06/07/13 1400  cefTRIAXone (ROCEPHIN) 2 g in dextrose 5 % 50 mL IVPB     2 g 100 mL/hr over 30 Minutes Intravenous Every 24 hours 06/07/13 0929 06/14/13 2359   06/06/13 1800  piperacillin-tazobactam (ZOSYN) IVPB 3.375 g  Status:  Discontinued     3.375 g 12.5 mL/hr over 240 Minutes Intravenous 3 times per day 06/06/13 1430 06/07/13 0929   06/04/13 1500  piperacillin-tazobactam (ZOSYN) IVPB 3.375 g  Status:  Discontinued     3.375 g 100 mL/hr over 30 Minutes Intravenous Every 6 hours 06/04/13 1441 06/06/13 1430   06/04/13 1400  piperacillin-tazobactam (ZOSYN) IVPB 2.25 g  Status:  Discontinued     2.25 g 100 mL/hr over 30 Minutes Intravenous 3 times per day 06/04/13 1333 06/04/13 1441      Assessment/Plan: s/p * No surgery found *  Urosepsis/R hydro R PCN 8/9- bloody output Better daily Plan per Uro   LOS: 4 days    Gwendolin Briel A 06/08/2013

## 2013-06-08 NOTE — Progress Notes (Signed)
Subjective: Denies nausea, denies ab pain.  Reports some right flank pain overnight now resolved.  Denies chest pain  Objective: Vital signs in last 24 hours: Filed Vitals:   06/08/13 0450 06/08/13 0500 06/08/13 0600 06/08/13 0700  BP: 128/67 125/71 128/104 134/73  Pulse:   96 92  Temp: 98.2 F (36.8 C)   98.8 F (37.1 C)  TempSrc: Oral   Oral  Resp:  21 23 20   Height:      Weight: 227 lb 4.7 oz (103.1 kg)     SpO2:   97% 96%   Weight change: -2 lb 3.3 oz (-1 kg)  Intake/Output Summary (Last 24 hours) at 06/08/13 1005 Last data filed at 06/08/13 0700  Gross per 24 hour  Intake 2019.78 ml  Output   3545 ml  Net -1525.22 ml   Vitals reviewed. General: resting in bed, NAD, arousable and oriented x 3  HEENT: [place]/at, no scleral icterus Cardiac: atrial fibrillation, no murmurs  Pulm: clear to auscultation bilaterally, no wheezes, rales, or rhonchi Abd/GU: soft, nontender, nondistended, BS present, right nephrostomy tube intact Ext: warm and well perfused, no pedal edema Neuro: arousable and oriented X3   Lab Results: Basic Metabolic Panel:  Recent Labs Lab 06/06/13 0400 06/07/13 0400 06/07/13 1800 06/08/13 0625  NA 138 136 137 138  K 3.5 3.3* 3.5 3.7  CL 101 99 100 101  CO2 30 30 31 26   GLUCOSE 129* 108* 112* 117*  BUN 10 17 16 17   CREATININE 1.01 1.70* 1.75* 1.84*  CALCIUM 7.2* 7.5* 7.3* 8.0*  MG 2.2 2.3  --   --   PHOS 1.0* 1.9*  --  2.5   Liver Function Tests: CBC:  Recent Labs Lab 06/07/13 0400 06/08/13 0625  WBC 13.3* 11.4*  NEUTROABS 10.2* 7.5  HGB 8.7* 9.2*  HCT 26.0* 28.1*  MCV 79.8 79.8  PLT 102* 121*   Cardiac Enzymes:  Recent Labs Lab 06/05/13 1555 06/05/13 2313 06/06/13 0400  TROPONINI 4.31* 4.14* 3.58*   BNP:  Recent Labs Lab 06/04/13 1235  PROBNP 22349.0*   CBG:  Recent Labs Lab 06/07/13 0814 06/07/13 1126 06/07/13 1741 06/07/13 2013 06/08/13 0452 06/08/13 0758  GLUCAP 113* 125* 110* 102* 104* 121*    Coagulation:  Recent Labs Lab 06/04/13 1325  LABPROT 18.9*  INR 1.63*   Urinalysis:  Recent Labs Lab 06/04/13 1250  COLORURINE AMBER*  LABSPEC 1.018  PHURINE 5.0  GLUCOSEU NEGATIVE  HGBUR LARGE*  BILIRUBINUR SMALL*  KETONESUR 15*  PROTEINUR 100*  UROBILINOGEN 1.0  NITRITE NEGATIVE  LEUKOCYTESUR SMALL*   Misc. Labs: none  Micro Results: Recent Results (from the past 240 hour(s))  MRSA PCR SCREENING     Status: None   Collection Time    06/04/13 12:29 PM      Result Value Range Status   MRSA by PCR NEGATIVE  NEGATIVE Final   Comment:            The GeneXpert MRSA Assay (FDA     approved for NASAL specimens     only), is one component of a     comprehensive MRSA colonization     surveillance program. It is not     intended to diagnose MRSA     infection nor to guide or     monitor treatment for     MRSA infections.  URINE CULTURE     Status: None   Collection Time    06/04/13 12:50 PM  Result Value Range Status   Specimen Description URINE, CATHETERIZED   Final   Special Requests NONE   Final   Culture  Setup Time     Final   Value: 06/04/2013 12:50     Performed at [place] Count     Final   Value: NO GROWTH     Performed at [facility]   Culture     Final   Value: NO GROWTH     Performed at [facility]   Report Status 06/05/2013 FINAL   Final  CULTURE, BLOOD (ROUTINE X 2)     Status: None   Collection Time    06/04/13  2:10 PM      Result Value Range Status   Specimen Description BLOOD RIGHT ARM   Final   Special Requests BOTTLES DRAWN AEROBIC ONLY 10CC   Final   Culture  Setup Time     Final   Value: 06/04/2013 16:44     Performed at [facility]   Culture     Final   Value:        BLOOD CULTURE RECEIVED NO GROWTH TO DATE CULTURE WILL BE HELD FOR 5 DAYS BEFORE ISSUING A FINAL NEGATIVE REPORT     Performed at [facility]   Report Status PENDING   Incomplete  CULTURE, BLOOD (ROUTINE  X 2)     Status: None   Collection Time    06/04/13  2:55 PM      Result Value Range Status   Specimen Description BLOOD RIGHT ARM   Final   Special Requests BOTTLES DRAWN AEROBIC AND ANAEROBIC 10CC   Final   Culture  Setup Time     Final   Value: 06/04/2013 16:45     Performed at [facility]   Culture     Final   Value:        BLOOD CULTURE RECEIVED NO GROWTH TO DATE CULTURE WILL BE HELD FOR 5 DAYS BEFORE ISSUING A FINAL NEGATIVE REPORT     Performed at [facility]   Report Status PENDING   Incomplete  BODY FLUID CULTURE     Status: None   Collection Time    06/04/13  3:58 PM      Result Value Range Status   Specimen Description FLUID   Final   Special Requests NONE   Final   Gram Stain     Final   Value: WBC PRESENT,BOTH PMN AND MONONUCLEAR     FEW GRAM NEGATIVE RODS     Performed at [facility]   Culture     Final   Value: FEW ESCHERICHIA COLI     Performed at [facility]   Report Status 06/06/2013 FINAL   Final   Organism ID, Bacteria ESCHERICHIA COLI   Final   Studies/Results: Dg Chest Port 1 View  06/07/2013   *RADIOLOGY REPORT*  Clinical Data: Short of breath  PORTABLE CHEST - 1 VIEW  Comparison: 06/06/2013  Findings: Endotracheal tube has been removed.  Right jugular catheter tip in the SVC.  Left jugular catheter tip in the SVC.  NG tube has been removed.  Negative for pneumothorax.  Left lower lobe atelectasis and effusion unchanged.  Right lung remains clear.  IMPRESSION: Endotracheal tube and NG tubes have been removed.  Left lower lobe atelectasis and effusion are unchanged.   Original Report Authenticated By: Carl Best, M.D.   Medications:  Scheduled  Meds: . amitriptyline  25 mg Oral QHS  . antiseptic oral rinse  15 mL Mouth Rinse QID  . cefTRIAXone (ROCEPHIN)  IV  2 g Intravenous Q24H  . chlorhexidine  15 mL Mouth Rinse BID  . pantoprazole (PROTONIX) IV  40 mg Intravenous QHS   Continuous Infusions: . amiodarone (NEXTERONE  PREMIX) 360 mg/200 mL dextrose 30 mg/hr (06/08/13 0118)  . dextrose 75 mL/hr at 06/07/13 2140  . heparin 1,200 Units/hr (06/08/13 0118)   PRN Meds:.sodium chloride, fentaNYL, metoprolol, midazolam, ondansetron Assessment/Plan: 18F transferred [facility] to [facility] 8[facility]9 with septic shock, AKI, ATN, likely pyelonephritis, GNR bacteremia (found at [place]), history of metabolic and respiratory acidosis this admission, hypoxic/hypercapneic respiratory failure (status post intubation 8/9) in setting of right sided ureteral obstruction s/p stent failure, lactic acidosis (4.7 on 8/9), elevated troponin likely 2/2 demand ischemia (type 2 NSTEMI) and acute renal failure.    1. Acute renal failure/ATN: Multifactorial etiology. urine output improved. Creatinine stable.  Renal will sign off and reconsult if needed.  HD catheter can be removed from renal standpoint.   2. History of combined respiratory and metabolic acidosis likely 2/2 to lactic acidosis:  Improved see #1. On room air doing well   3. Septic shock from GU source (likely right pyelonephritis with GNR bacteremia at [facility]): status post right percutaneous nephrostomy tube for right hydronephrosis.  Body fluid culture with few E.coli from culture. Zosyn (started 8/9-8/12), now on Rocephin 8/12>>.    4. Atrial fibrillation: history of and patient has been in atrial fibrillation this. On Amiodarone and Hep gtt. Will add BB when can tolerate. Echo done with EF 45-50%. Cards following and will follow with primary cardiologist outpatient  5. Right hydronephrosis and stricture: Urology following when patient recovers she will need anterograde nephrostogram with internal stent.   6. Elevated troponin likely secondary to demand ischemia (type 2 NSTEMI) and acute renal failure  7. Acute respiratory failure resolved.  The patient does have a current PCP ([name], MD). Outpatient urologist is Dr. Vara Guardian   LOS: 4 days   [name] [name] [id number] 06/08/2013, 10:05 AM

## 2013-06-08 NOTE — Progress Notes (Addendum)
[facility] Progress Note [place] TEAM 1 - Stepdown ICU Team   Genesia Strupp [facility] [id number] DOB: Oct 24, 1941 DOA: 06/04/2013 PCP: [name], MD  Brief narrative: 72 yo female with hx HTN, mult kidney stones, frequent UTI presented 8/8 to [facility] with fevers, chills, AMS. On 8/7 she underwent cystoscopy and attempted ureteral stent placement for R ureteral stricture. Per daughter, the stent fell out prior to her d/c post procedure and pt/MD elected to leave it out, hoping that the "balloon" he used would help improve the stricture. She was d/c home 8/7 but developed worsening abd pain, n/v and was seen in urology office 8/8 am and had foley placed for urinary retention. She returned home and by that evening developed fevers, chills, nausea, AMS and was taken to [facility] where she was found to be in septic shock with acute renal failure. She required intubation for worsening resp distress and was tx to [facility].   Since admission this patient has been evaluated by urology who recommended placement of a percutaneous nephrostomy tube. This was accomplished in interventional radiology.  Because of her severe acute renal failure she did require short-term CVVHD. In the interim this patient has a history of paroxysmal atrial fibrillation developed atrial fibrillation with RVR as well as abnormal elevation in troponin. Cardiology was consulted. It was felt the patient likely had demand ischemia due to acute stress in recent septic shock. Currently IV amiodarone infusion is being utilized for the atrial fibrillation. Patient stabilized and pressor agents were discontinued. Patient required intubation for less than 72 hours. She stabilized enough to transfer out to the step down unit and team 1 assumed care of this patient 06/08/2013  Assessment/Plan:    Septic shock -resolved -etiology due to UTI/bacteremia -Procalcitonin was greater than 175 with a lactic acid of 4.7 at  presentation    E coli bacteremia/Pyelonephritis -onset after GU tract instrumentation (failed insertion of ureteral stent) -cont anbx's - narrowed to Rocephin 8/11- consider total duration of 10 days    Acute renal failure on chronic kidney disease -Peak creatinine 3.22 at presentation with an associated GFR of 13 -required short term CVVHD until 8/11 -currently with excellent UOP -Nephro following    Acute respiratory failure with hypoxia -Primarily related to acute septic shock -Still requiring oxygen -Last chest x-ray on 06/06/2013 with increased interstitial markings with appearance of curly B-lines as well as possible evolving effusion therefore we'll repeat chest x-ray in a.m.      Right hydronephrosis and stricture s/p percutaneous nephrostomy tube -Has been evaluated by [name] with urology this admission- last note completed 06/05/2013. At that time the recommendation was to maintain the nephrostomy tube and not internalize the stent. He also endorsed she would probably need consideration of ureteral reimplantation if she has persistent obstruction on a follow up nephrostogram which needs to be completed once she recovers from the sepsis -Nephrostomy tube placed by interventional radiology therefore defer timing of nephrostogram to that service    Atrial fibrillation with rapid ventricular response -Cardiology following -On Amiodarone gtt - ? transition to PO ? -cont Heparin - defer to Cards whether chronic LT anticoagulation indicated -TSH normal    NSTEMI, initial episode of care -Peak troponin 5.06 this admission -Cards following -no indications for IP ischemic work up unless develops CP since etiology suspected to be demand ischemia  Right upper extremity edema -involves same arm as HD cath -check venous duplex to r/o DVT but likely edema 2/2 HD cath  so suspect would resolve if this line dc'd    Anemia, unspecified -baseline hgb unknown - was 12.7 at admit but  this in setting of acute sepsis and DH -check FOB and anemia panel   Diarrhea -likely secondary to diffuse inflammation post sepsis and anbx's  DVT prophylaxis: IV heparin Code Status: FULL Family Communication: Patient, husband and daughter at bedside Disposition Plan: Stepdown  Consultants: Urology Nephrology Cardiology Interventional Radiolgoy  Procedures: L IJ CVL 8/9>>>  ETT 8/9>>>8/11  R IJ HD cath 8/9 >>  CULTURES:  Blood 8/9 Oval Linsey) >> GNR >> E coli  Urine 8/9>>>  BCx2 8/9>>>  R nephrostomy drainage 8/9 >> few e coli  Antibiotics: Linezolid x1 8/9  Imipenem x1 8/9  Zosyn 8/9 >>>8/12  8/11 ceftriaxone>>   HPI/Subjective: Patient alert and states still quite anorexic. Also feels very weak. No complaints of chest pain or shortness of breath. No bowel pain or back pain.  Objective: Blood pressure 120/75, pulse 97, temperature 99 F (37.2 C), temperature source Oral, resp. rate 24, height 5\' 7"  (1.702 m), weight 103.1 kg (227 lb 4.7 oz), SpO2 95.00%.  Intake/Output Summary (Last 24 hours) at 06/08/13 1259 Last data filed at 06/08/13 1201  Gross per 24 hour  Intake 1754.38 ml  Output   4295 ml  Net -2540.62 ml    Exam: General: No acute respiratory distress Lungs: Clear to auscultation bilaterally without wheezes or crackles, RA Cardiovascular: Irregular rate and atrial fibrillation rhythm without murmur gallop or rub normal S1 and S2, 1+ bilateral peripheral edema - central lines in bilateral neck Abdomen: Nontender, nondistended, soft, bowel sounds positive, no rebound, no ascites, no appreciable mass-rectal tube with green bilious drainage Genitourinary: nephrostomy tube to st drain/blood tinged dark urine Musculoskeletal: No significant cyanosis, clubbing of bilateral lower extremities Neurological: Alert and oriented x 3, moves all extremities x 4 without focal neurological deficits, CN 2-12 intact  Scheduled Meds:  Scheduled Meds: .  amitriptyline  25 mg Oral QHS  . antiseptic oral rinse  15 mL Mouth Rinse QID  . carvedilol  3.125 mg Oral BID WC  . cefTRIAXone (ROCEPHIN)  IV  2 g Intravenous Q24H  . chlorhexidine  15 mL Mouth Rinse BID  . pantoprazole (PROTONIX) IV  40 mg Intravenous QHS   Continuous Infusions: . amiodarone (NEXTERONE PREMIX) 360 mg/200 mL dextrose 30 mg/hr (06/08/13 1128)  . dextrose 75 mL/hr at 06/08/13 1153  . heparin      Data Reviewed: Basic Metabolic Panel:  Recent Labs Lab 06/04/13 1234 06/04/13 1600 06/05/13 0420 06/05/13 1600 06/06/13 0400 06/07/13 0400 06/07/13 1800 06/08/13 0625  NA 137 137 137 136 138 136 137 138  K 4.9 4.5 4.1 3.9 3.5 3.3* 3.5 3.7  CL 110 107 102 101 101 99 100 101  CO2 11* 14* 23 28 30 30 31 26   GLUCOSE 128* 105* 110* 101* 129* 108* 112* 117*  BUN 40* 34* 19 15 10 17 16 17   CREATININE 3.22* 2.58* 1.57* 1.33* 1.01 1.70* 1.75* 1.84*  CALCIUM 6.8* 6.4* 7.0* 7.0* 7.2* 7.5* 7.3* 8.0*  MG 1.4*  --  1.8  --  2.2 2.3  --   --   PHOS 5.1* 3.9  --  1.5* 1.0* 1.9*  --  2.5   Liver Function Tests:  Recent Labs Lab 06/04/13 1234 06/04/13 1600 06/05/13 1600 06/06/13 0400 06/07/13 0400 06/08/13 0625  AST 74*  --   --   --  47*  --   ALT 30  --   --   --  23  --   ALKPHOS 63  --   --   --  65  --   BILITOT 0.6  --   --   --  0.3  --   PROT 5.8*  --   --   --  4.6*  --   ALBUMIN 2.4* 2.0* 1.8* 1.8* 1.7* 1.9*   CBC:  Recent Labs Lab 06/04/13 1234 06/05/13 0420 06/06/13 0400 06/07/13 0400 06/08/13 0625  WBC 32.9* 26.2* 17.8* 13.3* 11.4*  NEUTROABS  --   --   --  10.2* 7.5  HGB 12.7 10.8* 9.4* 8.7* 9.2*  HCT 40.8 32.1* 27.7* 26.0* 28.1*  MCV 85.0 80.0 79.4 79.8 79.8  PLT 273 152 106* 102* 121*   Cardiac Enzymes:  Recent Labs Lab 06/04/13 1835 06/05/13 0035 06/05/13 1555 06/05/13 2313 06/06/13 0400  TROPONINI 3.89* 5.06* 4.31* 4.14* 3.58*   BNP (last 3 results)  Recent Labs  06/04/13 1235  PROBNP 22349.0*   CBG:  Recent Labs Lab  06/07/13 1741 06/07/13 2013 06/08/13 0452 06/08/13 0758 06/08/13 1205  GLUCAP 110* 102* 104* 121* 101*    Recent Results (from the past 240 hour(s))  MRSA PCR SCREENING     Status: None   Collection Time    06/04/13 12:29 PM      Result Value Range Status   MRSA by PCR NEGATIVE  NEGATIVE Final   Comment:            The GeneXpert MRSA Assay (FDA     approved for NASAL specimens     only), is one component of a     comprehensive MRSA colonization     surveillance program. It is not     intended to diagnose MRSA     infection nor to guide or     monitor treatment for     MRSA infections.  URINE CULTURE     Status: None   Collection Time    06/04/13 12:50 PM      Result Value Range Status   Specimen Description URINE, CATHETERIZED   Final   Special Requests NONE   Final   Culture  Setup Time     Final   Value: 06/04/2013 12:50     Performed at [place]     Final   Value: NO GROWTH     Performed at [facility]   Culture     Final   Value: NO GROWTH     Performed at [facility]   Report Status 06/05/2013 FINAL   Final  CULTURE, BLOOD (ROUTINE X 2)     Status: None   Collection Time    06/04/13  2:10 PM      Result Value Range Status   Specimen Description BLOOD RIGHT ARM   Final   Special Requests BOTTLES DRAWN AEROBIC ONLY 10CC   Final   Culture  Setup Time     Final   Value: 06/04/2013 16:44     Performed at [facility]   Culture     Final   Value:        BLOOD CULTURE RECEIVED NO GROWTH TO DATE CULTURE WILL BE HELD FOR 5 DAYS BEFORE ISSUING A FINAL NEGATIVE REPORT     Performed at [facility]   Report Status PENDING   Incomplete  CULTURE, BLOOD (ROUTINE X 2)     Status: None   Collection Time    06/04/13  2:55 PM      Result Value Range Status   Specimen Description BLOOD RIGHT ARM   Final   Special Requests BOTTLES DRAWN AEROBIC AND ANAEROBIC 10CC   Final   Culture  Setup Time     Final   Value:  06/04/2013 16:45     Performed at [facility]   Culture     Final   Value:        BLOOD CULTURE RECEIVED NO GROWTH TO DATE CULTURE WILL BE HELD FOR 5 DAYS BEFORE ISSUING A FINAL NEGATIVE REPORT     Performed at [facility]   Report Status PENDING   Incomplete  BODY FLUID CULTURE     Status: None   Collection Time    06/04/13  3:58 PM      Result Value Range Status   Specimen Description FLUID   Final   Special Requests NONE   Final   Gram Stain     Final   Value: WBC PRESENT,BOTH PMN AND MONONUCLEAR     FEW GRAM NEGATIVE RODS     Performed at [facility]   Culture     Final   Value: FEW ESCHERICHIA COLI     Performed at [facility]   Report Status 06/06/2013 FINAL   Final   Organism ID, Bacteria ESCHERICHIA COLI   Final     Studies:  Recent x-ray studies have been reviewed in detail by the Attending Physician  Erin Hearing, ANP [facility] Office  [phone] Pager [phone]  **If unable to reach the above provider after paging please contact the [place] @ [phone]  On-Call/Text Page:      [email or web address]      password [email or web address]  If 7PM-7AM, please contact night-coverage [email or web address] Password [email or web address] 06/08/2013, 12:59 PM   LOS: 4 days   I have personally examined this patient and reviewed the entire database. I have reviewed the above note, made any necessary editorial changes, and agree with its content.  Cherene Altes, MD [facility]

## 2013-06-08 NOTE — Progress Notes (Signed)
[place] for heparin Indication: chest pain/ACS and atrial fibrillation  Allergies  Allergen Reactions  . Codeine Nausea Only    hallucinations  . Hydrocodone Nausea Only    hallucinations  . Macrodantin [Nitrofurantoin] Nausea And Vomiting    Patient Measurements: Height: 5\' 7"  (170.2 cm) Weight: 227 lb 4.7 oz (103.1 kg) IBW/kg (Calculated) : 61.6 Heparin Dosing Weight: 80kg  Vital Signs: Temp: 100.3 F (37.9 C) (08/13 2108) Temp src: Oral (08/13 2108) BP: 122/68 mmHg (08/13 2108) Pulse Rate: 88 (08/13 2000)  Labs:  Recent Labs  06/05/13 2313  06/06/13 0400 06/07/13 0400 06/07/13 1800 06/08/13 0017 06/08/13 0625 06/08/13 1112 06/08/13 2015  HGB  --   < > 9.4* 8.7*  --   --  9.2*  --   --   HCT  --   --  27.7* 26.0*  --   --  28.1*  --   --   PLT  --   --  106* 102*  --   --  121*  --   --   APTT  --   --  40* 29  --   --  34  --   --   HEPARINUNFRC  --   --   --   --   --  <0.10*  --  <0.10* <0.10*  CREATININE  --   < > 1.01 1.70* 1.75*  --  1.84*  --   --   TROPONINI 4.14*  --  3.58*  --   --   --   --   --   --   < > = values in this interval not displayed.  Estimated Creatinine Clearance: 34.6 ml/min (by C-G formula based on Cr of 1.84).  Assessment: 72 yo female with NSTEMI/Afib on heparin and heparin level is undetectable after increase to 1500 units/hr. Bolus on initiation was deferred due to blood  present in nephrostomy. Hg/hct= 9.2/28.1 and pltc= 121.  Some drainage noted from nephrostomy. Orders received earlier to start warfarin.    Goal of Therapy:  Heparin level 0.3-0.7 units/ml Monitor platelets by anticoagulation protocol: Yes   Plan:  -Increase heparin to 1800 units/hr -Heparin level in 8 hours  Erin Hearing PharmD., BCPS Clinical Pharmacist Pager [phone] 06/08/2013 10:17 PM

## 2013-06-08 NOTE — Progress Notes (Signed)
[place] for heparin Indication: chest pain/ACS and atrial fibrillation  Allergies  Allergen Reactions  . Codeine Nausea Only    hallucinations  . Hydrocodone Nausea Only    hallucinations  . Macrodantin [Nitrofurantoin] Nausea And Vomiting    Patient Measurements: Height: 5\' 7"  (170.2 cm) Weight: 227 lb 4.7 oz (103.1 kg) IBW/kg (Calculated) : 61.6 Heparin Dosing Weight: 80kg  Vital Signs: Temp: 100.3 F (37.9 C) (08/13 0005) Temp src: Oral (08/13 0005) BP: 123/87 mmHg (08/13 0005) Pulse Rate: 109 (08/12 2300)  Labs:  Recent Labs  06/05/13 0420 06/05/13 1555  06/05/13 2313 06/06/13 0400 06/07/13 0400 06/07/13 1800 06/08/13 0017  HGB 10.8*  --   --   --  9.4* 8.7*  --   --   HCT 32.1*  --   --   --  27.7* 26.0*  --   --   PLT 152  --   --   --  106* 102*  --   --   APTT 47*  --   --   --  40* 29  --   --   HEPARINUNFRC  --   --   --   --   --   --   --  <0.10*  CREATININE 1.57*  --   < >  --  1.01 1.70* 1.75*  --   TROPONINI  --  4.31*  --  4.14* 3.58*  --   --   --   < > = values in this interval not displayed.  Estimated Creatinine Clearance: 36.4 ml/min (by C-G formula based on Cr of 1.75).  Assessment: 72 yo female with NSTEMI/Afib for heparin  Goal of Therapy:  Heparin level 0.3-0.7 units/ml Monitor platelets by anticoagulation protocol: Yes   Plan:  Increase Heparin 1200 units/hr Check heparin level in 8 hours.  Phillis Knack, PharmD, BCPS  06/08/2013 2:11 AM

## 2013-06-08 NOTE — Progress Notes (Addendum)
[place] for heparin Indication: chest pain/ACS and atrial fibrillation  Allergies  Allergen Reactions  . Codeine Nausea Only    hallucinations  . Hydrocodone Nausea Only    hallucinations  . Macrodantin [Nitrofurantoin] Nausea And Vomiting    Patient Measurements: Height: 5\' 7"  (170.2 cm) Weight: 227 lb 4.7 oz (103.1 kg) IBW/kg (Calculated) : 61.6 Heparin Dosing Weight: 80kg  Vital Signs: Temp: 99 F (37.2 C) (08/13 1159) Temp src: Oral (08/13 1159) BP: 120/75 mmHg (08/13 1159) Pulse Rate: 97 (08/13 1159)  Labs:  Recent Labs  06/05/13 1555  06/05/13 2313  06/06/13 0400 06/07/13 0400 06/07/13 1800 06/08/13 0017 06/08/13 0625 06/08/13 1112  HGB  --   --   --   < > 9.4* 8.7*  --   --  9.2*  --   HCT  --   --   --   --  27.7* 26.0*  --   --  28.1*  --   PLT  --   --   --   --  106* 102*  --   --  121*  --   APTT  --   --   --   --  40* 29  --   --  34  --   HEPARINUNFRC  --   --   --   --   --   --   --  <0.10*  --  <0.10*  CREATININE  --   < >  --   --  1.01 1.70* 1.75*  --  1.84*  --   TROPONINI 4.31*  --  4.14*  --  3.58*  --   --   --   --   --   < > = values in this interval not displayed.  Estimated Creatinine Clearance: 34.6 ml/min (by C-G formula based on Cr of 1.84).  Assessment: 72 yo female with NSTEMI/Afib on heparin and heparin level is undetectable after increase to 1200 units/hr. Bolus on initiation was deferred due to blood  present in nephrostomy. Hg/hct= 9.2/28.1 and pltc= 121.  Some drainage noted from nephrostomy. Patient noted for consideration of short term coumadin.    Goal of Therapy:  Heparin level 0.3-0.7 units/ml Monitor platelets by anticoagulation protocol: Yes   Plan:  -Increase heparin to 1500 units/hr -Heparin level in 8 hours and daily wth CBC daily  Hildred Laser, Pharm D 06/08/2013 12:34 PM  Addendum: Coumadin per pharmacy  -INR= 1.63 on 8/9 -Patient noted on IV  amiodarone  Plan -Coumadin 3mg  po today  -Daily PT/INR -Begin education process  Hildred Laser, Pharm D 06/08/2013 1:14 PM

## 2013-06-08 NOTE — Progress Notes (Signed)
I have seen and examined this patient and agree with the plan of care. Will sign off please call if needed  [facility] W 06/08/2013, 1:27 PM

## 2013-06-08 NOTE — Progress Notes (Signed)
Upon arrival to shift urine was red/bloody. At 2220 pt reports urine was yellow earlier today. Received order from Rx to increase Heparin gtt to 18. NP Fabienne Bruns advised ok to increase Heparin gtt rate.

## 2013-06-08 NOTE — Progress Notes (Signed)
Patient ID: [name] [name], female   DOB: July 25, 1941, 72 y.o.   MRN: [id number]    Subjective:  Denies SSCP, palpitations or Dyspnea General malaise  Objective:  Filed Vitals:   06/08/13 0500 06/08/13 0600 06/08/13 0700 06/08/13 1159  BP: 125/71 128/104 134/73 120/75  Pulse:  96 92 97  Temp:   98.8 F (37.1 C) 99 F (37.2 C)  TempSrc:   Oral Oral  Resp: 21 23 20 24   Height:      Weight:      SpO2:  97% 96% 95%    Intake/Output from previous day:  Intake/Output Summary (Last 24 hours) at 06/08/13 1225 Last data filed at 06/08/13 1201  Gross per 24 hour  Intake 1754.38 ml  Output   4295 ml  Net -2540.62 ml    Physical Exam: Affect appropriate Elderly female HEENT: normal  Right IJ catheter Neck supple with no adenopathy JVP normal no bruits no thyromegaly Lungs clear with no wheezing and good diaphragmatic motion Heart:  S1/S2 no murmur, no rub, gallop or click PMI normal Abdomen: benighn, BS positve, no tenderness, no AAA no bruit.  No HSM or HJR Distal pulses intact with no bruits No edema Neuro non-focal Skin warm and dry No muscular weakness   Lab Results: Basic Metabolic Panel:  Recent Labs  06/06/13 0400 06/07/13 0400 06/07/13 1800 06/08/13 0625  NA 138 136 137 138  K 3.5 3.3* 3.5 3.7  CL 101 99 100 101  CO2 30 30 31 26   GLUCOSE 129* 108* 112* 117*  BUN 10 17 16 17   CREATININE 1.01 1.70* 1.75* 1.84*  CALCIUM 7.2* 7.5* 7.3* 8.0*  MG 2.2 2.3  --   --   PHOS 1.0* 1.9*  --  2.5   Liver Function Tests:  Recent Labs  06/07/13 0400 06/08/13 0625  AST 47*  --   ALT 23  --   ALKPHOS 65  --   BILITOT 0.3  --   PROT 4.6*  --   ALBUMIN 1.7* 1.9*   CBC:  Recent Labs  06/07/13 0400 06/08/13 0625  WBC 13.3* 11.4*  NEUTROABS 10.2* 7.5  HGB 8.7* 9.2*  HCT 26.0* 28.1*  MCV 79.8 79.8  PLT 102* 121*   Cardiac Enzymes:  Recent Labs  06/05/13 1555 06/05/13 2313 06/06/13 0400  TROPONINI 4.31* 4.14* 3.58*   Thyroid Function  Tests:  Recent Labs  06/06/13 0959  TSH 1.876    Imaging: Dg Chest Port 1 View  06/07/2013   *RADIOLOGY REPORT*  Clinical Data: Short of breath  PORTABLE CHEST - 1 VIEW  Comparison: 06/06/2013  Findings: Endotracheal tube has been removed.  Right jugular catheter tip in the SVC.  Left jugular catheter tip in the SVC.  NG tube has been removed.  Negative for pneumothorax.  Left lower lobe atelectasis and effusion unchanged.  Right lung remains clear.  IMPRESSION: Endotracheal tube and NG tubes have been removed.  Left lower lobe atelectasis and effusion are unchanged.   Original Report Authenticated By: Carl Best, M.D.    Cardiac Studies:  ECG:  afib nonspecific ST/T wave changes no acute ST elevation   Telemetry:  afib with good rate control 06/08/2013   Echo:  EF 45-50% only trivial MR and mild LAE study reviewed 06/08/2013   Medications:   . amitriptyline  25 mg Oral QHS  . antiseptic oral rinse  15 mL Mouth Rinse QID  . cefTRIAXone (ROCEPHIN)  IV  2 g Intravenous Q24H  .  chlorhexidine  15 mL Mouth Rinse BID  . pantoprazole (PROTONIX) IV  40 mg Intravenous QHS     . amiodarone (NEXTERONE PREMIX) 360 mg/200 mL dextrose 30 mg/hr (06/08/13 1128)  . dextrose 75 mL/hr at 06/08/13 1153  . heparin 1,200 Units/hr (06/08/13 0118)    Assessment/Plan:  PAF:  History of such whenever stressed.  Continue heparin and amiodarone.  Start coumadin ? In am when IJ catheter  Is removed.  Would be reasonable to have Rx INR on hospital discharge and primary cardiologist in [place] can decide on [facility] or not Sepsis:  Continue ceftriaxone.  Post uretal stent manipulation CAD:  No chest pain or acute ECG changes See note from Dr Acie Fredrickson  Add low dose beta blocker No indication for cath at this point Echo with mildly decreased EF but no discrete RWMA  Jenkins Rouge 06/08/2013, 12:25 PM

## 2013-06-09 ENCOUNTER — Encounter (HOSPITAL_COMMUNITY): Payer: Self-pay | Admitting: Radiology

## 2013-06-09 ENCOUNTER — Inpatient Hospital Stay (HOSPITAL_COMMUNITY): Payer: Medicare Other

## 2013-06-09 DIAGNOSIS — M7989 Other specified soft tissue disorders: Secondary | ICD-10-CM

## 2013-06-09 LAB — CBC WITH DIFFERENTIAL/PLATELET
Basophils Absolute: 0 10*3/uL (ref 0.0–0.1)
Basophils Relative: 0 % (ref 0–1)
Eosinophils Absolute: 0.7 10*3/uL (ref 0.0–0.7)
Eosinophils Relative: 6 % — ABNORMAL HIGH (ref 0–5)
HCT: 29.9 % — ABNORMAL LOW (ref 36.0–46.0)
Hemoglobin: 9.8 g/dL — ABNORMAL LOW (ref 12.0–15.0)
Lymphocytes Relative: 16 % (ref 12–46)
Lymphs Abs: 1.8 10*3/uL (ref 0.7–4.0)
MCH: 26.3 pg (ref 26.0–34.0)
MCHC: 32.8 g/dL (ref 30.0–36.0)
MCV: 80.2 fL (ref 78.0–100.0)
Monocytes Absolute: 1.9 10*3/uL — ABNORMAL HIGH (ref 0.1–1.0)
Monocytes Relative: 17 % — ABNORMAL HIGH (ref 3–12)
Neutro Abs: 7 10*3/uL (ref 1.7–7.7)
Neutrophils Relative %: 61 % (ref 43–77)
Platelets: 173 10*3/uL (ref 150–400)
RBC: 3.73 MIL/uL — ABNORMAL LOW (ref 3.87–5.11)
RDW: 16 % — ABNORMAL HIGH (ref 11.5–15.5)
WBC: 11.4 10*3/uL — ABNORMAL HIGH (ref 4.0–10.5)

## 2013-06-09 LAB — GLUCOSE, CAPILLARY
Glucose-Capillary: 82 mg/dL (ref 70–99)
Glucose-Capillary: 82 mg/dL (ref 70–99)
Glucose-Capillary: 62 mg/dL — ABNORMAL LOW (ref 70–99)
Glucose-Capillary: 78 mg/dL (ref 70–99)
Glucose-Capillary: 88 mg/dL (ref 70–99)

## 2013-06-09 LAB — RENAL FUNCTION PANEL
Albumin: 1.8 g/dL — ABNORMAL LOW (ref 3.5–5.2)
BUN: 16 mg/dL (ref 6–23)
CO2: 27 mEq/L (ref 19–32)
Calcium: 8.3 mg/dL — ABNORMAL LOW (ref 8.4–10.5)
Chloride: 102 mEq/L (ref 96–112)
Creatinine, Ser: 1.66 mg/dL — ABNORMAL HIGH (ref 0.50–1.10)
GFR calc Af Amer: 35 mL/min — ABNORMAL LOW (ref >90)
GFR calc non Af Amer: 30 mL/min — ABNORMAL LOW (ref >90)
Glucose, Bld: 82 mg/dL (ref 70–99)
Phosphorus: 2.5 mg/dL (ref 2.3–4.6)
Potassium: 3.4 mEq/L — ABNORMAL LOW (ref 3.5–5.1)
Sodium: 138 mEq/L (ref 135–145)

## 2013-06-09 LAB — IRON AND TIBC
Iron: 19 ug/dL — ABNORMAL LOW (ref 42–135)
Saturation Ratios: 8 % — ABNORMAL LOW (ref 20–55)
TIBC: 252 ug/dL (ref 250–470)
UIBC: 233 ug/dL (ref 125–400)

## 2013-06-09 LAB — CLOSTRIDIUM DIFFICILE BY PCR: Toxigenic C. Difficile by PCR: NEGATIVE

## 2013-06-09 LAB — RETICULOCYTES
RBC.: 3.73 MIL/uL — ABNORMAL LOW (ref 3.87–5.11)
Retic Count, Absolute: 37.3 10*3/uL (ref 19.0–186.0)
Retic Ct Pct: 1 % (ref 0.4–3.1)

## 2013-06-09 LAB — PROTIME-INR
INR: 1.09 (ref 0.00–1.49)
Prothrombin Time: 13.9 seconds (ref 11.6–15.2)

## 2013-06-09 LAB — MAGNESIUM: Magnesium: 1.8 mg/dL (ref 1.5–2.5)

## 2013-06-09 LAB — OCCULT BLOOD X 1 CARD TO LAB, STOOL: Fecal Occult Bld: NEGATIVE

## 2013-06-09 MED ORDER — CIPROFLOXACIN IN D5W 400 MG/200ML IV SOLN
400.0000 mg | Freq: Once | INTRAVENOUS | Status: DC
  Filled 2013-06-09: qty 200

## 2013-06-09 MED ORDER — IOHEXOL 300 MG/ML  SOLN
50.0000 mL | Freq: Once | INTRAMUSCULAR | Status: AC | PRN
  Administered 2013-06-09: 1 mL

## 2013-06-09 MED ORDER — HEPARIN (PORCINE) IN NACL 100-0.45 UNIT/ML-% IJ SOLN
1950.0000 [IU]/h | INTRAMUSCULAR | Status: DC
  Administered 2013-06-10: 1800 [IU]/h via INTRAVENOUS
  Administered 2013-06-10 – 2013-06-11 (×2): 2100 [IU]/h via INTRAVENOUS
  Administered 2013-06-12 (×2): 1950 [IU]/h via INTRAVENOUS
  Filled 2013-06-09 (×10): qty 250

## 2013-06-09 MED ORDER — MIDAZOLAM HCL 2 MG/2ML IJ SOLN
INTRAMUSCULAR | Status: AC | PRN
  Administered 2013-06-09: 2 mg via INTRAVENOUS

## 2013-06-09 MED ORDER — HEPARIN (PORCINE) IN NACL 100-0.45 UNIT/ML-% IJ SOLN: 1800.0000 [IU]/h | INTRAMUSCULAR | Status: DC

## 2013-06-09 MED ORDER — FENTANYL CITRATE 0.05 MG/ML IJ SOLN
INTRAMUSCULAR | Status: AC | PRN
  Administered 2013-06-09 (×2): 25 ug via INTRAVENOUS

## 2013-06-09 MED ORDER — WARFARIN SODIUM 3 MG PO TABS
3.0000 mg | ORAL_TABLET | Freq: Once | ORAL | Status: AC
  Administered 2013-06-09: 3 mg via ORAL
  Filled 2013-06-09: qty 1

## 2013-06-09 NOTE — Progress Notes (Signed)
VASCULAR LAB PRELIMINARY  PRELIMINARY  PRELIMINARY  PRELIMINARY  Right upper extremity venous duplex completed.    Preliminary report:  Right:  No evidence of DVT or superficial thrombosis.    Lismary Kiehn, RVT 06/09/2013, 11:17 AM

## 2013-06-09 NOTE — Progress Notes (Signed)
Urology Progress Note  1. Right ureteral stricture post retrograde, dilation and JJ stent ( with suture attached) in [place]. Stent/suture apparantly pulled, and JJ fell out, with resulting hydronephrosis, urosepsis, and xfer to [facility].  2. Urosepsis: now extubated, and IV antibiotics continued. ( Ceftriaxone)C/s below 3. R hydronephrosis: Has Right percutaneous nephrostomy: needs Right nephrostogram to evaluate the ureter for location and length of stricture,  and internalization of JJ stent. 4. Diarrhea: ? C. Dif. 5. A. Fib with  Good rate control. IV heparin, coumadin 6. Gross hematuria 2ndary to IV heparin.   Subjective:     No acute urologic events overnight. Ambulation:   negative Flatus:    positive Bowel movement  positive  Pain: some relief  Objective:  Blood pressure 126/63, pulse 98, temperature 98.2 F (36.8 C), temperature source Oral, resp. rate 22, height 5\' 7"  (1.702 m), weight 104.1 kg (229 lb 8 oz), SpO2 100.00%.  Physical Exam:  General:  No acute distress, awake  Genitourinary:  R perc Foley:  yes    I/O last 3 completed shifts: In: 1983 [I.V.:1983] Out: 5130 [Urine:5005; Stool:125]  Recent Labs     06/08/13  0625  06/09/13  0500  HGB  9.2*  9.8*  WBC  11.4*  11.4*  PLT  121*  173    Recent Labs     06/08/13  0625  06/09/13  0500  NA  138  138  K  3.7  3.4*  CL  101  102  CO2  26  27  BUN  17  16  CREATININE  1.84*  1.66*  CALCIUM  8.0*  8.3*  GFRNONAA  26*  30*  GFRAA  31*  35*     Recent Labs     06/07/13  0400  06/08/13  0625  06/08/13  2015  06/09/13  0500  INR   --    --   1.02  1.09  APTT  29  34   --    --      No components found with this basename: ABG,   Assessment/Plan:  R ureteral obstruction. R percutaneous nephrostomy.   P: need nephrostogram, and internalization of J stent. [name]

## 2013-06-09 NOTE — Progress Notes (Signed)
ANTICOAGULATION CONSULT NOTE - Follow Up Consult  Pharmacy Consult for Heparin/Coumadin Indication: chest pain/ACS and afib  Allergies  Allergen Reactions  . Codeine Nausea Only    hallucinations  . Hydrocodone Nausea Only    hallucinations  . Macrodantin [Nitrofurantoin] Nausea And Vomiting    Patient Measurements: Height: 5\' 7"  (170.2 cm) Weight: 229 lb 8 oz (104.1 kg) IBW/kg (Calculated) : 61.6 Heparin Dosing Weight: 85kg  Vital Signs: Temp: 98.1 F (36.7 C) (08/14 1600) Temp src: Oral (08/14 1600) BP: 139/70 mmHg (08/14 1605) Pulse Rate: 99 (08/14 1605)  Labs:  Recent Labs  06/07/13 0400 06/07/13 1800 06/08/13 0017 06/08/13 0625 06/08/13 1112 06/08/13 2015 06/09/13 0500  HGB 8.7*  --   --  9.2*  --   --  9.8*  HCT 26.0*  --   --  28.1*  --   --  29.9*  PLT 102*  --   --  121*  --   --  173  APTT 29  --   --  34  --   --   --   LABPROT  --   --   --   --   --  13.2 13.9  INR  --   --   --   --   --  1.02 1.09  HEPARINUNFRC  --   --  <0.10*  --  <0.10* <0.10*  --   CREATININE 1.70* 1.75*  --  1.84*  --   --  1.66*    Estimated Creatinine Clearance: 38.6 ml/min (by C-G formula based on Cr of 1.66).   Medications:  Scheduled:  . amitriptyline  25 mg Oral QHS  . antiseptic oral rinse  15 mL Mouth Rinse QID  . carvedilol  3.125 mg Oral BID WC  . cefTRIAXone (ROCEPHIN)  IV  2 g Intravenous Q24H  . chlorhexidine  15 mL Mouth Rinse BID  . pantoprazole  40 mg Oral Q1200  . warfarin  3 mg Oral ONCE-1800  . warfarin   Does not apply Once  . Warfarin - Pharmacist Dosing Inpatient   Does not apply q1800    Assessment:  72 yo female with NSTEMI/Afib. S/p nephrostogram and French double-J ureteral stent placement. Per radiology will restart heparin at 1900 at 1800 units/hr Bolus on initiation was deferred due to blood present in nephrostomy,tube, and red tinged urine. Hg/hct stable 9.8 and plt up 173.  Pt INR subtherapeutic, with a coumadin score of 1.   Goal  of Therapy:  INR 2-3 Heparin level 0.3-0.7 units/ml  Monitor platelets by anticoagulation protocol: Yes   Plan:  -Restart heparin 1800 units/hr, F/U on HL 1900  -Heparin level in 8 hours  -Coumadin 3mg  PO x1  -F/U HL, PT/INR, and signs of bleed    Jerilee Space M. Posey Pronto, PharmD  Clinical Pharmacist- Resident  Pager: [phone]  Pharmacy: [phone]  06/09/2013 8:07 AM

## 2013-06-09 NOTE — Progress Notes (Deleted)
[place] for heparin Indication: chest pain/ACS and atrial fibrillation  Allergies  Allergen Reactions  . Codeine Nausea Only    hallucinations  . Hydrocodone Nausea Only    hallucinations  . Macrodantin [Nitrofurantoin] Nausea And Vomiting    Patient Measurements: Height: 5\' 7"  (170.2 cm) Weight: 229 lb 8 oz (104.1 kg) IBW/kg (Calculated) : 61.6 Heparin Dosing Weight: 85kg  Vital Signs: Temp: 98.2 F (36.8 C) (08/14 0740) Temp src: Oral (08/14 0740) BP: 126/63 mmHg (08/14 0740) Pulse Rate: 98 (08/14 0740)  Labs:  Recent Labs  06/07/13 0400 06/07/13 1800 06/08/13 0017 06/08/13 0625 06/08/13 1112 06/08/13 2015 06/09/13 0500  HGB 8.7*  --   --  9.2*  --   --  9.8*  HCT 26.0*  --   --  28.1*  --   --  29.9*  PLT 102*  --   --  121*  --   --  173  APTT 29  --   --  34  --   --   --   LABPROT  --   --   --   --   --  13.2 13.9  INR  --   --   --   --   --  1.02 1.09  HEPARINUNFRC  --   --  <0.10*  --  <0.10* <0.10*  --   CREATININE 1.70* 1.75*  --  1.84*  --   --  1.66*    Estimated Creatinine Clearance: 38.6 ml/min (by C-G formula based on Cr of 1.66).  Assessment: 72 yo female with NSTEMI/Afib on heparin and heparin level is undetectable after further increase to 1800 units/hr. Bolus on initiation was deferred due to blood present in nephrostomy,tube, and red tinged urine. Hg/hct stable 9.8 and plt up 173.   Pt INR subtherapeutic, with a coumadin score of 1.   Goal of Therapy:  Heparin level 0.3-0.7 units/ml Monitor platelets by anticoagulation protocol: Yes   Plan:  -Increase heparin to 2100 units/hr, F/U on HL 1600 -Heparin level in 8 hours -Coumadin 3mg  PO x1 -F/U HL, PT/INR, and signs of bleed  Kayzlee Wirtanen M. Posey Pronto, PharmD Clinical Pharmacist- Resident Pager: [phone] Pharmacy: [phone] 06/09/2013 8:07 AM

## 2013-06-09 NOTE — Progress Notes (Signed)
Physical Therapy Treatment Patient Details Name: PINKIE POOL MRN: [id number] DOB: 03/02/1941 Today's Date: 06/09/2013 Time: 0825-0850 PT Time Calculation (min): 25 min  PT Assessment / Plan / Recommendation  History of Present Illness 72 yo female with hx kidney stones, frequent UTI, HTN tx 8/9 from [facility] with septic shock, acute renal failure and R pyelonephritis after failed ureteral stent placement 8/7.  S/p R perc nephrostomy 8/9. CVVHD.    PT Comments   Pt with nonproductive cough and pt reported "I can't walk any further until this cough goes away."  Pt started coughing sitting EOB.  Pt moving well and continue to encourage further mobility with RN staff.  Will continue to follow.   Follow Up Recommendations  Home health PT;Supervision for mobility/OOB     Does the patient have the potential to tolerate intense rehabilitation     Barriers to Discharge        Equipment Recommendations  None recommended by PT    Recommendations for Other Services    Frequency Min 3X/week   Progress towards PT Goals Progress towards PT goals: Progressing toward goals  Plan Current plan remains appropriate    Precautions / Restrictions Precautions Precautions: Fall Precaution Comments: nephrostomy drain Restrictions Weight Bearing Restrictions: No   Pertinent Vitals/Pain No c/o pain    Mobility  Bed Mobility Bed Mobility: Supine to Sit Supine to Sit: 4: Min assist;HOB elevated;With rails Details for Bed Mobility Assistance: cueing for hand placement and sequence with assist to fully elevate trunk Transfers Transfers: Sit to Stand;Stand to Sit;Stand Pivot Transfers Sit to Stand: 4: Min assist;From bed;From chair/3-in-1 Stand to Sit: 4: Min assist;To chair/3-in-1 Details for Transfer Assistance: (A) to initiate transfer with cues for hand placement Ambulation/Gait Ambulation/Gait Assistance: 4: Min guard Ambulation Distance (Feet): 8 Feet Assistive device: Rolling  walker Ambulation/Gait Assistance Details: Minguard for safety with cues for RW placement Gait Pattern: Step-through pattern;Decreased stride length Gait velocity: decreased Stairs: No    Exercises General Exercises - Lower Extremity Ankle Circles/Pumps: AAROM;Both;10 reps;Supine   PT Diagnosis:    PT Problem List:   PT Treatment Interventions:     PT Goals (current goals can now be found in the care plan section) Acute Rehab PT Goals Patient Stated Goal: To go home PT Goal Formulation: With patient/family Time For Goal Achievement: 06/21/13 Potential to Achieve Goals: Good  Visit Information  Last PT Received On: 06/09/13 Assistance Needed: +1 History of Present Illness: 72 yo female with hx kidney stones, frequent UTI, HTN tx 8/9 from [facility] with septic shock, acute renal failure and R pyelonephritis after failed ureteral stent placement 8/7.  S/p R perc nephrostomy 8/9. CVVHD.     Subjective Data  Subjective: "I can't walk anymore until I get this cough under control." Patient Stated Goal: To go home   Cognition  Cognition Arousal/Alertness: Awake/alert Behavior During Therapy: WFL for tasks assessed/performed Overall Cognitive Status: Within Functional Limits for tasks assessed    Balance     End of Session PT - End of Session Equipment Utilized During Treatment: Gait belt Activity Tolerance: Patient limited by fatigue Patient left: in chair;with call bell/phone within reach;with nursing/sitter in room;with family/visitor present Nurse Communication: Mobility status   GP     Tniya Bowditch 06/09/2013, 9:39 AM  Antoine Poche, [place] DPT [phone]

## 2013-06-09 NOTE — Progress Notes (Signed)
Patient ID: [name] [name], female   DOB: 07-03-1941, 72 y.o.   MRN: [id number]    Subjective:  Denies SSCP, palpitations or Dyspnea Slept a bit better   Objective:  Filed Vitals:   06/09/13 0427 06/09/13 0500 06/09/13 0625 06/09/13 0740  BP: 115/64  132/71 126/63  Pulse:   91 98  Temp: 98.5 F (36.9 C)   98.2 F (36.8 C)  TempSrc: Oral   Oral  Resp:    22  Height:      Weight: 227 lb 4.7 oz (103.1 kg) 229 lb 8 oz (104.1 kg)    SpO2:    100%    Intake/Output from previous day:  Intake/Output Summary (Last 24 hours) at 06/09/13 [id number] Last data filed at 06/09/13 0741  Gross per 24 hour  Intake  582.3 ml  Output   3405 ml  Net -2822.7 ml    Physical Exam: Affect appropriate Elderly female HEENT: normal  Right IJ catheter has been removed with dressing applied  Neck supple with no adenopathy JVP normal no bruits no thyromegaly Lungs clear with no wheezing and good diaphragmatic motion Heart:  S1/S2 no murmur, no rub, gallop or click PMI normal Abdomen: benighn, BS positve, no tenderness, no AAA no bruit.  No HSM or HJR Distal pulses intact with no bruits No edema Neuro non-focal Skin warm and dry No muscular weakness   Lab Results: Basic Metabolic Panel:  Recent Labs  06/07/13 0400  06/08/13 0625 06/09/13 0500  NA 136  < > 138 138  K 3.3*  < > 3.7 3.4*  CL 99  < > 101 102  CO2 30  < > 26 27  GLUCOSE 108*  < > 117* 82  BUN 17  < > 17 16  CREATININE 1.70*  < > 1.84* 1.66*  CALCIUM 7.5*  < > 8.0* 8.3*  MG 2.3  --   --  1.8  PHOS 1.9*  --  2.5 2.5  < > = values in this interval not displayed. Liver Function Tests:  Recent Labs  06/07/13 0400 06/08/13 0625 06/09/13 0500  AST 47*  --   --   ALT 23  --   --   ALKPHOS 65  --   --   BILITOT 0.3  --   --   PROT 4.6*  --   --   ALBUMIN 1.7* 1.9* 1.8*   CBC:  Recent Labs  06/08/13 0625 06/09/13 0500  WBC 11.4* 11.4*  NEUTROABS 7.5 7.0  HGB 9.2* 9.8*  HCT 28.1* 29.9*  MCV 79.8 80.2  PLT  121* 173   Cardiac Enzymes: No results found for this basename: CKTOTAL, CKMB, CKMBINDEX, TROPONINI,  in the last 72 hours Thyroid Function Tests:  Recent Labs  06/06/13 0959  TSH 1.876    Imaging: No results found.  Cardiac Studies:  ECG:  afib nonspecific ST/T wave changes no acute ST elevation   Telemetry:  afib with good rate control 06/09/2013   Echo:  EF 45-50% only trivial MR and mild LAE study reviewed 06/09/2013   Medications:   . amitriptyline  25 mg Oral QHS  . antiseptic oral rinse  15 mL Mouth Rinse QID  . carvedilol  3.125 mg Oral BID WC  . cefTRIAXone (ROCEPHIN)  IV  2 g Intravenous Q24H  . chlorhexidine  15 mL Mouth Rinse BID  . pantoprazole  40 mg Oral Q1200  . warfarin  3 mg Oral ONCE-1800  . warfarin   Does  not apply Once  . Warfarin - Pharmacist Dosing Inpatient   Does not apply q1800     . amiodarone (NEXTERONE PREMIX) 360 mg/200 mL dextrose 30 mg/hr (06/09/13 0202)  . heparin 1,800 Units/hr (06/08/13 2254)    Assessment/Plan:  PAF:  History of such whenever stressed.  Continue heparin and amiodarone.  Coumadin started yesterday Sepsis:  Continue ceftriaxone.  Post uretal stent manipulation CAD:  No chest pain or acute ECG changes See note from Dr Acie Fredrickson  Add low dose beta blocker No indication for cath at this point Echo with mildly decreased EF but no discrete RWMA  Jenkins Rouge 06/09/2013, 8:21 AM

## 2013-06-09 NOTE — Progress Notes (Signed)
  Subjective: Rt PCN placed 8/9 Urosepsis/hydro Pt better daily  Objective: Vital signs in last 24 hours: Temp:  [98.2 F (36.8 C)-100.3 F (37.9 C)] 98.2 F (36.8 C) (08/14 0740) Pulse Rate:  [87-101] 98 (08/14 0740) Resp:  [17-27] 22 (08/14 0740) BP: (115-132)/(63-75) 126/63 mmHg (08/14 0740) SpO2:  [95 %-100 %] 100 % (08/14 0740) Weight:  [227 lb 4.7 oz (103.1 kg)-229 lb 8 oz (104.1 kg)] 229 lb 8 oz (104.1 kg) (08/14 0500) Last BM Date: 06/08/13  Intake/Output from previous day: 08/13 0701 - 08/14 0700 In: 582.3 [I.V.:582.3] Out: 3255 [Urine:3130; Stool:125] Intake/Output this shift: Total I/O In: -  Out: 150 [Urine:150]  PE:  Afeb; vss Rt PCN intact; site clean and dry NT to touch Output 105 cc yesterday- bloody 20 cc in bag now- bloody UOP from bladder- bloody today Bun/Cr: trending down  Lab Results:   Recent Labs  06/08/13 0625 06/09/13 0500  WBC 11.4* 11.4*  HGB 9.2* 9.8*  HCT 28.1* 29.9*  PLT 121* 173   BMET  Recent Labs  06/08/13 0625 06/09/13 0500  NA 138 138  K 3.7 3.4*  CL 101 102  CO2 26 27  GLUCOSE 117* 82  BUN 17 16  CREATININE 1.84* 1.66*  CALCIUM 8.0* 8.3*   PT/INR  Recent Labs  06/08/13 2015 06/09/13 0500  LABPROT 13.2 13.9  INR 1.02 1.09   ABG No results found for this basename: PHART, PCO2, PO2, HCO3,  in the last 72 hours  Studies/Results: No results found.  Anti-infectives: Anti-infectives   Start     Dose/Rate Route Frequency Ordered Stop   06/07/13 1400  cefTRIAXone (ROCEPHIN) 2 g in dextrose 5 % 50 mL IVPB     2 g 100 mL/hr over 30 Minutes Intravenous Every 24 hours 06/07/13 0929 06/14/13 2359   06/06/13 1800  piperacillin-tazobactam (ZOSYN) IVPB 3.375 g  Status:  Discontinued     3.375 g 12.5 mL/hr over 240 Minutes Intravenous 3 times per day 06/06/13 1430 06/07/13 0929   06/04/13 1500  piperacillin-tazobactam (ZOSYN) IVPB 3.375 g  Status:  Discontinued     3.375 g 100 mL/hr over 30 Minutes Intravenous  Every 6 hours 06/04/13 1441 06/06/13 1430   06/04/13 1400  piperacillin-tazobactam (ZOSYN) IVPB 2.25 g  Status:  Discontinued     2.25 g 100 mL/hr over 30 Minutes Intravenous 3 times per day 06/04/13 1333 06/04/13 1441      Assessment/Plan: s/p * No surgery found *  Urosepsis; hydro R PCN placed 8/9 Output still bloody Plan per Urology Can do nephrostogram and attempt double JJ when output clearing  LOS: 5 days    Kenneisha Cochrane A 06/09/2013

## 2013-06-09 NOTE — H&P (Signed)
[name] [name] is an 72 y.o. female.   Chief Complaint: Pt with long history of renal stones Hospitalized at [facility] with Rt hydro ~1 week ago Internal stent was placed while there- but dislodged immediately Developed urosepsis and transferred to [facility] 8/9  Rt percutaneous nephrostomy placed 8/9 in IR Pt now doing much better; Bun/Cr tending down Still has bloody output from R PCN New bloody UOP secondary recent Hep drip and coumadin for Afib Scheduled now for Rt nephrostogram and probable Rt double JJ placement per Dr Gaynelle Arabian request- discussed with Dr Vernard Gambles HPI: Afib; HTN; renal stones; HLD  Past Medical History  Diagnosis Date  . GERD (gastroesophageal reflux disease)   . Kidney stones   . A-fib   . HTN (hypertension)   . Hyperlipidemia     Past Surgical History  Procedure Laterality Date  . Cystoscopy with ureteroscopy and stent placement Left   . Cystoscopy with stent placement Right   . Lumbar laminectomy      History reviewed. No pertinent family history. Social History:  reports that she has never smoked. She does not have any smokeless tobacco history on file. She reports that she does not drink alcohol or use illicit drugs.  Allergies:  Allergies  Allergen Reactions  . Codeine Nausea Only    hallucinations  . Hydrocodone Nausea Only    hallucinations  . Macrodantin [Nitrofurantoin] Nausea And Vomiting    Medications Prior to Admission  Medication Sig Dispense Refill  . amitriptyline (ELAVIL) 25 MG tablet Take 25 mg by mouth at bedtime.      [name] aspirin EC 325 MG tablet Take 325 mg by mouth daily.      . fenofibrate micronized (LOFIBRA) 134 MG capsule Take 134 mg by mouth daily before breakfast.      . metoprolol tartrate (LOPRESSOR) 25 MG tablet Take 25 mg by mouth at bedtime.      [name] omeprazole (PRILOSEC) 20 MG capsule Take 20 mg by mouth daily.      . ondansetron (ZOFRAN) 4 MG tablet Take 4 mg by mouth every 12 (twelve) hours as needed for nausea.       [name] sulfamethoxazole-trimethoprim (BACTRIM DS) 800-160 MG per tablet Take 1 tablet by mouth 2 (two) times daily.        Results for orders placed during the hospital encounter of 06/04/13 (from the past 48 hour(s))  GLUCOSE, CAPILLARY     Status: Abnormal   Collection Time    06/07/13 11:26 AM      Result Value Range   Glucose-Capillary 125 (*) 70 - 99 mg/dL  GLUCOSE, CAPILLARY     Status: Abnormal   Collection Time    06/07/13  5:41 PM      Result Value Range   Glucose-Capillary 110 (*) 70 - 99 mg/dL  BASIC METABOLIC PANEL     Status: Abnormal   Collection Time    06/07/13  6:00 PM      Result Value Range   Sodium 137  135 - 145 mEq/L   Potassium 3.5  3.5 - 5.1 mEq/L   Chloride 100  96 - 112 mEq/L   CO2 31  19 - 32 mEq/L   Glucose, Bld 112 (*) 70 - 99 mg/dL   BUN 16  6 - 23 mg/dL   Creatinine, Ser 1.75 (*) 0.50 - 1.10 mg/dL   Calcium 7.3 (*) 8.4 - 10.5 mg/dL   GFR calc non Af Amer 28 (*) >90 mL/min   GFR  calc Af Amer 33 (*) >90 mL/min   Comment:            The eGFR has been calculated     using the CKD EPI equation.     This calculation has not been     validated in all clinical     situations.     eGFR's persistently     <90 mL/min signify     possible Chronic Kidney Disease.  GLUCOSE, CAPILLARY     Status: Abnormal   Collection Time    06/07/13  8:13 PM      Result Value Range   Glucose-Capillary 102 (*) 70 - 99 mg/dL   Comment 1 Notify RN    HEPARIN LEVEL (UNFRACTIONATED)     Status: Abnormal   Collection Time    06/08/13 12:17 AM      Result Value Range   Heparin Unfractionated <0.10 (*) 0.30 - 0.70 IU/mL   Comment:            IF HEPARIN RESULTS ARE BELOW     EXPECTED VALUES, AND PATIENT     DOSAGE HAS BEEN CONFIRMED,     SUGGEST FOLLOW UP TESTING     OF ANTITHROMBIN III LEVELS.  GLUCOSE, CAPILLARY     Status: Abnormal   Collection Time    06/08/13  4:52 AM      Result Value Range   Glucose-Capillary 104 (*) 70 - 99 mg/dL  RENAL FUNCTION PANEL      Status: Abnormal   Collection Time    06/08/13  6:25 AM      Result Value Range   Sodium 138  135 - 145 mEq/L   Potassium 3.7  3.5 - 5.1 mEq/L   Chloride 101  96 - 112 mEq/L   CO2 26  19 - 32 mEq/L   Glucose, Bld 117 (*) 70 - 99 mg/dL   BUN 17  6 - 23 mg/dL   Creatinine, Ser 1.84 (*) 0.50 - 1.10 mg/dL   Calcium 8.0 (*) 8.4 - 10.5 mg/dL   Phosphorus 2.5  2.3 - 4.6 mg/dL   Albumin 1.9 (*) 3.5 - 5.2 g/dL   GFR calc non Af Amer 26 (*) >90 mL/min   GFR calc Af Amer 31 (*) >90 mL/min   Comment:            The eGFR has been calculated     using the CKD EPI equation.     This calculation has not been     validated in all clinical     situations.     eGFR's persistently     <90 mL/min signify     possible Chronic Kidney Disease.  CBC WITH DIFFERENTIAL     Status: Abnormal   Collection Time    06/08/13  6:25 AM      Result Value Range   WBC 11.4 (*) 4.0 - 10.5 K/uL   RBC 3.52 (*) 3.87 - 5.11 MIL/uL   Hemoglobin 9.2 (*) 12.0 - 15.0 g/dL   HCT 28.1 (*) 36.0 - 46.0 %   MCV 79.8  78.0 - 100.0 fL   MCH 26.1  26.0 - 34.0 pg   MCHC 32.7  30.0 - 36.0 g/dL   RDW 16.2 (*) 11.5 - 15.5 %   Platelets 121 (*) 150 - 400 K/uL   Neutrophils Relative % 65  43 - 77 %   Neutro Abs 7.5  1.7 - 7.7 K/uL   Lymphocytes Relative 13  12 - 46 %   Lymphs Abs 1.5  0.7 - 4.0 K/uL   Monocytes Relative 18 (*) 3 - 12 %   Monocytes Absolute 2.1 (*) 0.1 - 1.0 K/uL   Eosinophils Relative 3  0 - 5 %   Eosinophils Absolute 0.3  0.0 - 0.7 K/uL   Basophils Relative 0  0 - 1 %   Basophils Absolute 0.0  0.0 - 0.1 K/uL  APTT     Status: None   Collection Time    06/08/13  6:25 AM      Result Value Range   aPTT 34  24 - 37 seconds  GLUCOSE, CAPILLARY     Status: Abnormal   Collection Time    06/08/13  7:58 AM      Result Value Range   Glucose-Capillary 121 (*) 70 - 99 mg/dL  HEPARIN LEVEL (UNFRACTIONATED)     Status: Abnormal   Collection Time    06/08/13 11:12 AM      Result Value Range   Heparin  Unfractionated <0.10 (*) 0.30 - 0.70 IU/mL   Comment:            IF HEPARIN RESULTS ARE BELOW     EXPECTED VALUES, AND PATIENT     DOSAGE HAS BEEN CONFIRMED,     SUGGEST FOLLOW UP TESTING     OF ANTITHROMBIN III LEVELS.     REPEATED TO VERIFY  GLUCOSE, CAPILLARY     Status: Abnormal   Collection Time    06/08/13 12:05 PM      Result Value Range   Glucose-Capillary 101 (*) 70 - 99 mg/dL  GLUCOSE, CAPILLARY     Status: Abnormal   Collection Time    06/08/13  4:04 PM      Result Value Range   Glucose-Capillary 100 (*) 70 - 99 mg/dL  HEPARIN LEVEL (UNFRACTIONATED)     Status: Abnormal   Collection Time    06/08/13  8:15 PM      Result Value Range   Heparin Unfractionated <0.10 (*) 0.30 - 0.70 IU/mL   Comment:            IF HEPARIN RESULTS ARE BELOW     EXPECTED VALUES, AND PATIENT     DOSAGE HAS BEEN CONFIRMED,     SUGGEST FOLLOW UP TESTING     OF ANTITHROMBIN III LEVELS.  PROTIME-INR     Status: None   Collection Time    06/08/13  8:15 PM      Result Value Range   Prothrombin Time 13.2  11.6 - 15.2 seconds   INR 1.02  0.00 - 1.49  GLUCOSE, CAPILLARY     Status: None   Collection Time    06/08/13  9:06 PM      Result Value Range   Glucose-Capillary 91  70 - 99 mg/dL   Comment 1 Notify RN    RENAL FUNCTION PANEL     Status: Abnormal   Collection Time    06/09/13  5:00 AM      Result Value Range   Sodium 138  135 - 145 mEq/L   Potassium 3.4 (*) 3.5 - 5.1 mEq/L   Chloride 102  96 - 112 mEq/L   CO2 27  19 - 32 mEq/L   Glucose, Bld 82  70 - 99 mg/dL   BUN 16  6 - 23 mg/dL   Creatinine, Ser 1.66 (*) 0.50 - 1.10 mg/dL   Calcium 8.3 (*) 8.4 - 10.5 mg/dL  Phosphorus 2.5  2.3 - 4.6 mg/dL   Albumin 1.8 (*) 3.5 - 5.2 g/dL   GFR calc non Af Amer 30 (*) >90 mL/min   GFR calc Af Amer 35 (*) >90 mL/min   Comment: (NOTE)     The eGFR has been calculated using the CKD EPI equation.     This calculation has not been validated in all clinical situations.     eGFR's persistently  <90 mL/min signify possible Chronic Kidney     Disease.  CBC WITH DIFFERENTIAL     Status: Abnormal   Collection Time    06/09/13  5:00 AM      Result Value Range   WBC 11.4 (*) 4.0 - 10.5 K/uL   RBC 3.73 (*) 3.87 - 5.11 MIL/uL   Hemoglobin 9.8 (*) 12.0 - 15.0 g/dL   HCT 29.9 (*) 36.0 - 46.0 %   MCV 80.2  78.0 - 100.0 fL   MCH 26.3  26.0 - 34.0 pg   MCHC 32.8  30.0 - 36.0 g/dL   RDW 16.0 (*) 11.5 - 15.5 %   Platelets 173  150 - 400 K/uL   Neutrophils Relative % 61  43 - 77 %   Neutro Abs 7.0  1.7 - 7.7 K/uL   Lymphocytes Relative 16  12 - 46 %   Lymphs Abs 1.8  0.7 - 4.0 K/uL   Monocytes Relative 17 (*) 3 - 12 %   Monocytes Absolute 1.9 (*) 0.1 - 1.0 K/uL   Eosinophils Relative 6 (*) 0 - 5 %   Eosinophils Absolute 0.7  0.0 - 0.7 K/uL   Basophils Relative 0  0 - 1 %   Basophils Absolute 0.0  0.0 - 0.1 K/uL  PROTIME-INR     Status: None   Collection Time    06/09/13  5:00 AM      Result Value Range   Prothrombin Time 13.9  11.6 - 15.2 seconds   INR 1.09  0.00 - 1.49  RETICULOCYTES     Status: Abnormal   Collection Time    06/09/13  5:00 AM      Result Value Range   Retic Ct Pct 1.0  0.4 - 3.1 %   RBC. 3.73 (*) 3.87 - 5.11 MIL/uL   Retic Count, Manual 37.3  19.0 - 186.0 K/uL  MAGNESIUM     Status: None   Collection Time    06/09/13  5:00 AM      Result Value Range   Magnesium 1.8  1.5 - 2.5 mg/dL  GLUCOSE, CAPILLARY     Status: None   Collection Time    06/09/13  8:18 AM      Result Value Range   Glucose-Capillary 82  70 - 99 mg/dL   No results found.  Review of Systems  Constitutional: Positive for malaise/fatigue. Negative for fever.  Respiratory: Negative for cough.   Gastrointestinal: Negative for nausea, vomiting and abdominal pain.  Genitourinary: Positive for hematuria.  Neurological: Positive for weakness.    Blood pressure 126/63, pulse 98, temperature 98.2 F (36.8 C), temperature source Oral, resp. rate 22, height 5\' 7"  (1.702 m), weight 229 lb 8 oz  (104.1 kg), SpO2 100.00%. Physical Exam  Constitutional: She is oriented to person, place, and time.  Cardiovascular: Normal rate, regular rhythm and normal heart sounds.   No murmur heard. Respiratory: Effort normal. She has wheezes.  GI: Soft. Bowel sounds are normal. There is no tenderness.  Rt PCN in place Output  still bloody  Musculoskeletal: Normal range of motion.  Neurological: She is alert and oriented to person, place, and time.  Psychiatric: She has a normal mood and affect. Her behavior is normal. Judgment and thought content normal.     Assessment/Plan Urosepsis/Rt Hydro transfer from [place] 8/9 R PCN placed 8/9 Pt much better-  Now scheduled for R nephrostogram and probable double JJ placement Pt and husband aware of procedure benefits and risks and agreeable to proceed Consent signed and in chart Hep off at 9 am On Rocephin  Tyquez Hollibaugh A 06/09/2013, 9:08 AM

## 2013-06-09 NOTE — Progress Notes (Signed)
[facility] Progress Note [name] TEAM 1 - Stepdown ICU Team   Jayleah [name] [facility] [id number] DOB: 1940/11/16 DOA: 06/04/2013 PCP: [name], MD  Brief narrative: 72 yo female with hx HTN, mult kidney stones, frequent UTI presented 8/8 to [facility] with fevers, chills, AMS. On 8/7 she underwent cystoscopy and attempted ureteral stent placement for R ureteral stricture. Per daughter, the stent fell out prior to her d/c post procedure and pt/MD elected to leave it out, hoping that the "balloon" he used would help improve the stricture. She was d/c home 8/7 but developed worsening abd pain, n/v and was seen in urology office 8/8 am and had foley placed for urinary retention. She returned home and by that evening developed fevers, chills, nausea, AMS and was taken to [facility] where she was found to be in septic shock with acute renal failure. She required intubation for worsening resp distress and was tx to [facility].   Since admission this patient has been evaluated by urology who recommended placement of a percutaneous nephrostomy tube. This was accomplished in interventional radiology.  Because of her severe acute renal failure she did require short-term CVVHD. In the interim this patient has a history of paroxysmal atrial fibrillation developed atrial fibrillation with RVR as well as abnormal elevation in troponin. Cardiology was consulted. It was felt the patient likely had demand ischemia due to acute stress in recent septic shock. Currently IV amiodarone infusion is being utilized for the atrial fibrillation. Patient stabilized and pressor agents were discontinued. Patient required intubation for less than 72 hours. She stabilized enough to transfer out to the step down unit and team 1 assumed care of this patient 06/08/2013  Assessment/Plan:    Septic shock -resolved -etiology due to UTI/bacteremia -Procalcitonin was greater than 175 with a lactic acid of 4.7 at  presentation    E coli bacteremia/Pyelonephritis -onset after GU tract instrumentation (failed insertion of ureteral stent) -cont anbx's - narrowed to Rocephin 8/11- consider total duration of 10 days or at direction of urology since will undergo surgical procedure of GU tract on 8/14    Acute renal failure on chronic kidney disease -Peak creatinine 3.22 at presentation with an associated GFR of 13 -required short term CVVHD until 8/11 -currently with excellent UOP -Nephro following    Acute respiratory failure with hypoxia -Primarily related to acute septic shock -Still requiring oxygen -Last chest x-ray on 06/06/2013 with increased interstitial markings with appearance of curly B-lines as well as possible evolving effusion therefore we'll repeat chest x-ray in a.m.      Right hydronephrosis and stricture s/p percutaneous nephrostomy tube/hematuria -Has been evaluated by [name] with urology this admission- last note completed 06/05/2013. At that time the recommendation was to maintain the nephrostomy tube and not internalize the stent. He also endorsed she would probably need consideration of ureteral reimplantation if she has persistent obstruction on a follow up nephrostogram which needs to be completed once she recovers from the sepsis -Urology has seen pt 8/14- plan is for nephrostogram and likely internalization of JJ stent -Heparin dc'd this am pre procedure    Atrial fibrillation with rapid ventricular response -Cardiology following -On Amiodarone gtt - ? transition to PO ? -cont Heparin post op at recommendations of Urology - defer to Cards whether chronic anticoagulation indicated -TSH normal    NSTEMI, initial episode of care -Peak troponin 5.06 this admission -Cards following -no indications for IP ischemic work up unless develops CP since etiology suspected  to be demand ischemia  Right upper extremity edema -involves same arm as HD cath -Preliminary venous duplex  negative for DVT -HD cath dc'd 8/13 with noted decrease in edema    Anemia, unspecified -baseline hgb unknown - was 12.7 at admit but this in setting of acute sepsis and DH -check FOB and anemia panel   Diarrhea -likely secondary to diffuse inflammation post sepsis and anbx's -as precaution C. Diff PCR pending  DVT prophylaxis: IV heparin-dc'd pre op- if can't be resumed post op at minimum will need SCD's -will apply pre op Code Status: FULL Family Communication: Patient and husband at bedside Disposition Plan: Stepdown  Consultants: Urology Nephrology Cardiology Interventional Radiology  Procedures: L IJ CVL 8/9>>>  ETT 8/9>>>8/11  R IJ HD cath 8/9 >> 8/13  CULTURES:  Blood 8/9 Oval Linsey) >> GNR >> E coli  Urine 8/9>>> negative BCx2 8/9>>> NGTD R nephrostomy drainage 8/9 >> few e coli  Antibiotics: Linezolid x1 8/9  Imipenem x1 8/9  Zosyn 8/9 >>>8/12  8/11 ceftriaxone>>   HPI/Subjective: Patient alert and states still quite anorexic. No pain endorsed.  Objective: Blood pressure 115/65, pulse 86, temperature 98.2 F (36.8 C), temperature source Oral, resp. rate 16, height 5\' 7"  (1.702 m), weight 104.1 kg (229 lb 8 oz), SpO2 93.00%.  Intake/Output Summary (Last 24 hours) at 06/09/13 1119 Last data filed at 06/09/13 0900  Gross per 24 hour  Intake  728.4 ml  Output   3405 ml  Net -2676.6 ml    Exam: General: No acute respiratory distress Lungs: Clear to auscultation bilaterally without wheezes or crackles, RA Cardiovascular: Irregular rate and atrial fibrillation rhythm without murmur gallop or rub normal S1 and S2, 1+ bilateral peripheral edema - central lines in bilateral neck Abdomen: Nontender, nondistended, soft, bowel sounds positive, no rebound, no ascites, no appreciable mass-rectal tube with green bilious drainage Genitourinary: nephrostomy tube to st drain/blood tinged dark urine Musculoskeletal: No significant cyanosis, clubbing of bilateral lower  extremities Neurological: Alert and oriented x 3, moves all extremities x 4 without focal neurological deficits, CN 2-12 intact  Scheduled Meds:  Scheduled Meds: . amitriptyline  25 mg Oral QHS  . antiseptic oral rinse  15 mL Mouth Rinse QID  . carvedilol  3.125 mg Oral BID WC  . cefTRIAXone (ROCEPHIN)  IV  2 g Intravenous Q24H  . chlorhexidine  15 mL Mouth Rinse BID  . pantoprazole  40 mg Oral Q1200  . warfarin  3 mg Oral ONCE-1800  . warfarin   Does not apply Once  . Warfarin - Pharmacist Dosing Inpatient   Does not apply q1800   Continuous Infusions: . amiodarone (NEXTERONE PREMIX) 360 mg/200 mL dextrose 30 mg/hr (06/09/13 0202)    Data Reviewed: Basic Metabolic Panel:  Recent Labs Lab 06/04/13 1234  06/05/13 0420 06/05/13 1600 06/06/13 0400 06/07/13 0400 06/07/13 1800 06/08/13 0625 06/09/13 0500  NA 137  < > 137 136 138 136 137 138 138  K 4.9  < > 4.1 3.9 3.5 3.3* 3.5 3.7 3.4*  CL 110  < > 102 101 101 99 100 101 102  CO2 11*  < > 23 28 30 30 31 26 27   GLUCOSE 128*  < > 110* 101* 129* 108* 112* 117* 82  BUN 40*  < > 19 15 10 17 16 17 16   CREATININE 3.22*  < > 1.57* 1.33* 1.01 1.70* 1.75* 1.84* 1.66*  CALCIUM 6.8*  < > 7.0* 7.0* 7.2* 7.5* 7.3* 8.0* 8.3*  MG  1.4*  --  1.8  --  2.2 2.3  --   --  1.8  PHOS 5.1*  < >  --  1.5* 1.0* 1.9*  --  2.5 2.5  < > = values in this interval not displayed. Liver Function Tests:  Recent Labs Lab 06/04/13 1234  06/05/13 1600 06/06/13 0400 06/07/13 0400 06/08/13 0625 06/09/13 0500  AST 74*  --   --   --  47*  --   --   ALT 30  --   --   --  23  --   --   ALKPHOS 63  --   --   --  65  --   --   BILITOT 0.6  --   --   --  0.3  --   --   PROT 5.8*  --   --   --  4.6*  --   --   ALBUMIN 2.4*  < > 1.8* 1.8* 1.7* 1.9* 1.8*  < > = values in this interval not displayed. CBC:  Recent Labs Lab 06/05/13 0420 06/06/13 0400 06/07/13 0400 06/08/13 0625 06/09/13 0500  WBC 26.2* 17.8* 13.3* 11.4* 11.4*  NEUTROABS  --   --  10.2*  7.5 7.0  HGB 10.8* 9.4* 8.7* 9.2* 9.8*  HCT 32.1* 27.7* 26.0* 28.1* 29.9*  MCV 80.0 79.4 79.8 79.8 80.2  PLT 152 106* 102* 121* 173   Cardiac Enzymes:  Recent Labs Lab 06/04/13 1835 06/05/13 0035 06/05/13 1555 06/05/13 2313 06/06/13 0400  TROPONINI 3.89* 5.06* 4.31* 4.14* 3.58*   BNP (last 3 results)  Recent Labs  06/04/13 1235  PROBNP 22349.0*   CBG:  Recent Labs Lab 06/08/13 0758 06/08/13 1205 06/08/13 1604 06/08/13 2106 06/09/13 0818  GLUCAP 121* 101* 100* 91 82    Recent Results (from the past 240 hour(s))  MRSA PCR SCREENING     Status: None   Collection Time    06/04/13 12:29 PM      Result Value Range Status   MRSA by PCR NEGATIVE  NEGATIVE Final   Comment:            The GeneXpert MRSA Assay (FDA     approved for NASAL specimens     only), is one component of a     comprehensive MRSA colonization     surveillance program. It is not     intended to diagnose MRSA     infection nor to guide or     monitor treatment for     MRSA infections.  URINE CULTURE     Status: None   Collection Time    06/04/13 12:50 PM      Result Value Range Status   Specimen Description URINE, CATHETERIZED   Final   Special Requests NONE   Final   Culture  Setup Time     Final   Value: 06/04/2013 12:50     Performed at [place]     Final   Value: NO GROWTH     Performed at [facility]   Culture     Final   Value: NO GROWTH     Performed at [facility]   Report Status 06/05/2013 FINAL   Final  CULTURE, BLOOD (ROUTINE X 2)     Status: None   Collection Time    06/04/13  2:10 PM      Result Value Range Status   Specimen Description BLOOD RIGHT ARM  Final   Special Requests BOTTLES DRAWN AEROBIC ONLY 10CC   Final   Culture  Setup Time     Final   Value: 06/04/2013 16:44     Performed at [facility]   Culture     Final   Value:        BLOOD CULTURE RECEIVED NO GROWTH TO DATE CULTURE WILL BE HELD FOR 5 DAYS  BEFORE ISSUING A FINAL NEGATIVE REPORT     Performed at [facility]   Report Status PENDING   Incomplete  CULTURE, BLOOD (ROUTINE X 2)     Status: None   Collection Time    06/04/13  2:55 PM      Result Value Range Status   Specimen Description BLOOD RIGHT ARM   Final   Special Requests BOTTLES DRAWN AEROBIC AND ANAEROBIC 10CC   Final   Culture  Setup Time     Final   Value: 06/04/2013 16:45     Performed at [facility]   Culture     Final   Value:        BLOOD CULTURE RECEIVED NO GROWTH TO DATE CULTURE WILL BE HELD FOR 5 DAYS BEFORE ISSUING A FINAL NEGATIVE REPORT     Performed at [facility]   Report Status PENDING   Incomplete  BODY FLUID CULTURE     Status: None   Collection Time    06/04/13  3:58 PM      Result Value Range Status   Specimen Description FLUID   Final   Special Requests NONE   Final   Gram Stain     Final   Value: WBC PRESENT,BOTH PMN AND MONONUCLEAR     FEW GRAM NEGATIVE RODS     Performed at [facility]   Culture     Final   Value: FEW ESCHERICHIA COLI     Performed at [facility]   Report Status 06/06/2013 FINAL   Final   Organism ID, Bacteria ESCHERICHIA COLI   Final     Studies:  Recent x-ray studies have been reviewed in detail by the Attending Physician  Erin Hearing, ANP [facility] Office  [phone] Pager [phone]  **If unable to reach the above provider after paging please contact the [place] @ [phone]  On-Call/Text Page:      [email or web address]      password [email or web address]  If 7PM-7AM, please contact night-coverage [email or web address] Password [email or web address] 06/09/2013, 11:19 AM   LOS: 5 days   I have examined the patient, reviewed the chart and modified the above note which I agree with.   [id number] 06/09/2013, 5:05 PM

## 2013-06-10 LAB — CBC WITH DIFFERENTIAL/PLATELET
Basophils Absolute: 0 10*3/uL (ref 0.0–0.1)
Basophils Relative: 0 % (ref 0–1)
Eosinophils Absolute: 0.6 10*3/uL (ref 0.0–0.7)
Eosinophils Relative: 5 % (ref 0–5)
HCT: 29.6 % — ABNORMAL LOW (ref 36.0–46.0)
Hemoglobin: 9.7 g/dL — ABNORMAL LOW (ref 12.0–15.0)
Lymphocytes Relative: 19 % (ref 12–46)
Lymphs Abs: 2.1 10*3/uL (ref 0.7–4.0)
MCH: 26.1 pg (ref 26.0–34.0)
MCHC: 32.8 g/dL (ref 30.0–36.0)
MCV: 79.6 fL (ref 78.0–100.0)
Monocytes Absolute: 1.6 10*3/uL — ABNORMAL HIGH (ref 0.1–1.0)
Monocytes Relative: 15 % — ABNORMAL HIGH (ref 3–12)
Neutro Abs: 6.7 10*3/uL (ref 1.7–7.7)
Neutrophils Relative %: 60 % (ref 43–77)
Platelets: 253 10*3/uL (ref 150–400)
RBC: 3.72 MIL/uL — ABNORMAL LOW (ref 3.87–5.11)
RDW: 15.5 % (ref 11.5–15.5)
WBC: 11.1 10*3/uL — ABNORMAL HIGH (ref 4.0–10.5)

## 2013-06-10 LAB — RENAL FUNCTION PANEL
Albumin: 1.9 g/dL — ABNORMAL LOW (ref 3.5–5.2)
BUN: 17 mg/dL (ref 6–23)
CO2: 28 mEq/L (ref 19–32)
Calcium: 8.5 mg/dL (ref 8.4–10.5)
Chloride: 103 mEq/L (ref 96–112)
Creatinine, Ser: 1.41 mg/dL — ABNORMAL HIGH (ref 0.50–1.10)
GFR calc Af Amer: 42 mL/min — ABNORMAL LOW (ref >90)
GFR calc non Af Amer: 37 mL/min — ABNORMAL LOW (ref >90)
Glucose, Bld: 84 mg/dL (ref 70–99)
Phosphorus: 2.2 mg/dL — ABNORMAL LOW (ref 2.3–4.6)
Potassium: 3.2 mEq/L — ABNORMAL LOW (ref 3.5–5.1)
Sodium: 139 mEq/L (ref 135–145)

## 2013-06-10 LAB — CULTURE, BLOOD (ROUTINE X 2)
Culture: NO GROWTH
Culture: NO GROWTH

## 2013-06-10 LAB — PROTIME-INR
INR: 1.25 (ref 0.00–1.49)
Prothrombin Time: 15.4 seconds — ABNORMAL HIGH (ref 11.6–15.2)

## 2013-06-10 LAB — VITAMIN B12: Vitamin B-12: >2000 pg/mL — ABNORMAL HIGH (ref 211–911)

## 2013-06-10 LAB — GLUCOSE, CAPILLARY
Glucose-Capillary: 91 mg/dL (ref 70–99)
Glucose-Capillary: 93 mg/dL (ref 70–99)
Glucose-Capillary: 81 mg/dL (ref 70–99)
Glucose-Capillary: 114 mg/dL — ABNORMAL HIGH (ref 70–99)
Glucose-Capillary: 128 mg/dL — ABNORMAL HIGH (ref 70–99)

## 2013-06-10 LAB — URIC ACID: Uric Acid, Serum: 5.6 mg/dL (ref 2.4–7.0)

## 2013-06-10 LAB — FERRITIN: Ferritin: 70 ng/mL (ref 10–291)

## 2013-06-10 LAB — HEPARIN LEVEL (UNFRACTIONATED)
Heparin Unfractionated: <0.1 IU/mL — ABNORMAL LOW (ref 0.30–0.70)
Heparin Unfractionated: 0.42 IU/mL (ref 0.30–0.70)
Heparin Unfractionated: 0.4 IU/mL (ref 0.30–0.70)

## 2013-06-10 LAB — FOLATE: Folate: 8.3 ng/mL

## 2013-06-10 LAB — MAGNESIUM: Magnesium: 1.7 mg/dL (ref 1.5–2.5)

## 2013-06-10 MED ORDER — METHYLPREDNISOLONE SODIUM SUCC 125 MG IJ SOLR
125.0000 mg | Freq: Once | INTRAMUSCULAR | Status: AC
  Administered 2013-06-10: 125 mg via INTRAVENOUS
  Filled 2013-06-10: qty 2

## 2013-06-10 MED ORDER — PREDNISONE 50 MG PO TABS
50.0000 mg | ORAL_TABLET | Freq: Every day | ORAL | Status: DC
  Filled 2013-06-10: qty 1

## 2013-06-10 MED ORDER — WARFARIN SODIUM 4 MG PO TABS
4.0000 mg | ORAL_TABLET | Freq: Once | ORAL | Status: AC
  Administered 2013-06-10: 4 mg via ORAL
  Filled 2013-06-10: qty 1

## 2013-06-10 NOTE — Progress Notes (Addendum)
ANTICOAGULATION CONSULT NOTE - Follow Up Consult  Pharmacy Consult for Heparin/Coumadin Indication: NSTEMI and Afib  Allergies  Allergen Reactions  . Codeine Nausea Only    hallucinations  . Hydrocodone Nausea Only    hallucinations  . Macrodantin [Nitrofurantoin] Nausea And Vomiting    Patient Measurements: Height: 5\' 7"  (170.2 cm) Weight: 225 lb 12 oz (102.4 kg) IBW/kg (Calculated) : 61.6 Heparin Dosing Weight: 85 kg  Vital Signs: Temp: 98.9 F (37.2 C) (08/15 1236) Temp src: Oral (08/15 1236) BP: 108/89 mmHg (08/15 1236) Pulse Rate: 95 (08/15 1236)  Labs:  Recent Labs  06/08/13 0625  06/08/13 2015 06/09/13 0500 06/10/13 0259 06/10/13 1429  HGB 9.2*  --   --  9.8* 9.7*  --   HCT 28.1*  --   --  29.9* 29.6*  --   PLT 121*  --   --  173 253  --   APTT 34  --   --   --   --   --   LABPROT  --   --  13.2 13.9 15.4*  --   INR  --   --  1.02 1.09 1.25  --   HEPARINUNFRC  --   < > <0.10*  --  <0.10* 0.42  CREATININE 1.84*  --   --  1.66* 1.41*  --   < > = values in this interval not displayed.  Estimated Creatinine Clearance: 45 ml/min (by C-G formula based on Cr of 1.41).   Medications:  . amiodarone (NEXTERONE PREMIX) 360 mg/200 mL dextrose 30 mg/hr (06/10/13 0522)  . heparin 2,100 Units/hr (06/10/13 0549)    Assessment:  Patient continues on heparin and Coumadin for NSTEMI and Afib. Heparin level is therapeutic at 2100 units/hr. INR is subtherapeutic at 1.25. Hematuria noted, H/H are low stable, platelets have trended back up to normal.  Goal of Therapy:  Heparin level 0.3-0.7 units/ml  Monitor platelets by anticoagulation protocol: Yes   Plan:  Continue heparin drip at 2100 units/hr Confirmatory heparin level in 6 hrs Coumadin 4 mg PO tonight INR daily  [facility], [place].D., BCPS Clinical Pharmacist Pager: [phone] 06/10/2013 3:22 PM

## 2013-06-10 NOTE — Progress Notes (Signed)
[facility] Progress Note [place] TEAM 1 - Stepdown ICU Team   [name] [name] [facility] [id number] DOB: 08-06-1941 DOA: 06/04/2013 PCP: [name], MD  Brief narrative: 72 yo female with hx HTN, mult kidney stones, frequent UTI presented 8/8 to [facility] with fevers, chills, AMS. On 8/7 she underwent cystoscopy and attempted ureteral stent placement for R ureteral stricture. Per daughter, the stent fell out prior to her d/c post procedure and pt/MD elected to leave it out, hoping that the "balloon" he used would help improve the stricture. She was d/c home 8/7 but developed worsening abd pain, n/v and was seen in urology office 8/8 am and had foley placed for urinary retention. She returned home and by that evening developed fevers, chills, nausea, AMS and was taken to [facility] where she was found to be in septic shock with acute renal failure. She required intubation for worsening resp distress and was tx to [facility].   Since admission this patient has been evaluated by urology who recommended placement of a percutaneous nephrostomy tube. This was accomplished in interventional radiology.  Because of her severe acute renal failure she did require short-term CVVHD. In the interim this patient has a history of paroxysmal atrial fibrillation developed atrial fibrillation with RVR as well as abnormal elevation in troponin. Cardiology was consulted. It was felt the patient likely had demand ischemia due to acute stress in recent septic shock. Currently IV amiodarone infusion is being utilized for the atrial fibrillation. Patient stabilized and pressor agents were discontinued. Patient required intubation for less than 72 hours. She stabilized enough to transfer out to the step down unit and team 1 assumed care of this patient 06/08/2013  Assessment/Plan:    Septic shock -resolved -etiology due to UTI/bacteremia -Procalcitonin was greater than 175 with a lactic acid of 4.7 at  presentation    E coli bacteremia/Pyelonephritis -onset after GU tract instrumentation (failed insertion of ureteral stent) -cont anbx's - narrowed to Rocephin 8/11- consider total duration of 10 days or at direction of urology since will undergo surgical procedure of GU tract on 8/14    Acute renal failure on chronic kidney disease -Peak creatinine 3.22 at presentation with an associated GFR of 13 -required short term CVVHD until 8/11 -currently with excellent UOP -Nephro following    Acute respiratory failure with hypoxia -Primarily related to acute septic shock -Still requiring oxygen -Last chest x-ray on 06/06/2013 with increased interstitial markings with appearance of curly B-lines as well as possible evolving effusion therefore we'll repeat chest x-ray in a.m.      Right hydronephrosis and stricture s/p percutaneous nephrostomy tube/hematuria -Has been evaluated by [name] with urology this admission- last note completed 06/05/2013. At that time the recommendation was to maintain the nephrostomy tube and not internalize the stent. He also endorsed she would probably need consideration of ureteral reimplantation if she has persistent obstruction on a follow up nephrostogram which needs to be completed once she recovers from the sepsis -Urology has seen pt 8/14- plan is for nephrostogram and likely internalization of JJ stent -Heparin dc'd this am pre procedure    Atrial fibrillation with rapid ventricular response -Cardiology following -On Amiodarone gtt - ? transition to PO ? -cont Heparin and Coumadin per Cards -TSH normal    NSTEMI, initial episode of care -Peak troponin 5.06 this admission -Cards following-adding low dose BB (8/15) -no indications for IP ischemic work up unless develops CP since etiology suspected to be demand ischemia  Right  upper extremity edema/significant right hand and thumb pain -involves same arm as HD cath -Venous duplex negative for DVT -HD  cath dc'd 8/13 with noted decrease in edema -(8/15) has now developed subtle redness over right hand and thumb which is exquisitely tender to touch-diff includes gout vs early cellulitis -Uric acid normal but sx's have just started -will give 1x dose IV Solumedrol then pulse with high dose steroids x 4 days- if sx's evolve or don't improve and are c/w cellulitis then will need to stop steroids and broaden anbx's -no fevers but has had low grade leukocytosis and since initiating steroids expect will have slightly worsened leukocytosis so can't use this parameter use as guide for possible infection    Anemia, unspecified -baseline hgb unknown - was 12.7 at admit but this in setting of acute sepsis and DH -check FOB and anemia panel   Diarrhea -likely secondary to diffuse inflammation post sepsis and anbx's -C. Diff PCR negative  DVT prophylaxis: IV heparin-dc'd pre op- if can't be resumed post op at minimum will need SCD's -will apply pre op Code Status: FULL Family Communication: Patient and husband at bedside Disposition Plan: Stepdown  Consultants: Urology Nephrology Cardiology Interventional Radiology  Procedures: L IJ CVL 8/9>>>  ETT 8/9>>>8/11  R IJ HD cath 8/9 >> 8/13  CULTURES:  Blood 8/9 Oval [place]) >> GNR >> E coli  Urine 8/9>>> negative BCx2 8/9>>> NGTD R nephrostomy drainage 8/9 >> few e coli  Antibiotics: Linezolid x1 8/9  Imipenem x1 8/9  Zosyn 8/9 >>>8/12  8/11 ceftriaxone>>   HPI/Subjective: Patient alert and states still quite anorexic. Complains of acute severe right hand pain esp in thumb.  Objective: Blood pressure 108/89, pulse 95, temperature 98.9 F (37.2 C), temperature source Oral, resp. rate 16, height 5\' 7"  (1.702 m), weight 102.4 kg (225 lb 12 oz), SpO2 95.00%.  Intake/Output Summary (Last 24 hours) at 06/10/13 1336 Last data filed at 06/10/13 1244  Gross per 24 hour  Intake 928.45 ml  Output   3650 ml  Net -2721.55 ml     Exam: General: No acute respiratory distress Lungs: Clear to auscultation bilaterally without wheezes or crackles, RA Cardiovascular: Irregular rate and atrial fibrillation rhythm without murmur gallop or rub normal S1 and S2, 1+ bilateral peripheral edema - central lines in bilateral neck-strong radial pulse on right Abdomen: Nontender, nondistended, soft, bowel sounds positive, no rebound, no ascites, no appreciable mass-rectal tube with green bilious drainage Genitourinary: nephrostomy tube to st drain/blood tinged dark urine Musculoskeletal: No significant cyanosis, clubbing of bilateral lower extremities but there is new redness without erythema of right hand with warmth to touch Neurological: Alert and oriented x 3, moves all extremities x 4 without focal neurological deficits, CN 2-12 intact  Scheduled Meds:  Scheduled Meds: . amitriptyline  25 mg Oral QHS  . antiseptic oral rinse  15 mL Mouth Rinse QID  . carvedilol  3.125 mg Oral BID WC  . cefTRIAXone (ROCEPHIN)  IV  2 g Intravenous Q24H  . chlorhexidine  15 mL Mouth Rinse BID  . pantoprazole  40 mg Oral Q1200  . warfarin   Does not apply Once  . Warfarin - Pharmacist Dosing Inpatient   Does not apply q1800   Continuous Infusions: . amiodarone (NEXTERONE PREMIX) 360 mg/200 mL dextrose 30 mg/hr (06/10/13 0522)  . heparin 2,100 Units/hr (06/10/13 0549)    Data Reviewed: Basic Metabolic Panel:  Recent Labs Lab 06/05/13 0420  06/06/13 0400 06/07/13 0400 06/07/13 1800 06/08/13 [id number]  06/09/13 0500 06/10/13 0259  NA 137  < > 138 136 137 138 138 139  K 4.1  < > 3.5 3.3* 3.5 3.7 3.4* 3.2*  CL 102  < > 101 99 100 101 102 103  CO2 23  < > 30 30 31 26 27 28   GLUCOSE 110*  < > 129* 108* 112* 117* 82 84  BUN 19  < > 10 17 16 17 16 17   CREATININE 1.57*  < > 1.01 1.70* 1.75* 1.84* 1.66* 1.41*  CALCIUM 7.0*  < > 7.2* 7.5* 7.3* 8.0* 8.3* 8.5  MG 1.8  --  2.2 2.3  --   --  1.8 1.7  PHOS  --   < > 1.0* 1.9*  --  2.5 2.5 2.2*   < > = values in this interval not displayed. Liver Function Tests:  Recent Labs Lab 06/04/13 1234  06/06/13 0400 06/07/13 0400 06/08/13 0625 06/09/13 0500 06/10/13 0259  AST 74*  --   --  47*  --   --   --   ALT 30  --   --  23  --   --   --   ALKPHOS 63  --   --  65  --   --   --   BILITOT 0.6  --   --  0.3  --   --   --   PROT 5.8*  --   --  4.6*  --   --   --   ALBUMIN 2.4*  < > 1.8* 1.7* 1.9* 1.8* 1.9*  < > = values in this interval not displayed. CBC:  Recent Labs Lab 06/06/13 0400 06/07/13 0400 06/08/13 0625 06/09/13 0500 06/10/13 0259  WBC 17.8* 13.3* 11.4* 11.4* 11.1*  NEUTROABS  --  10.2* 7.5 7.0 6.7  HGB 9.4* 8.7* 9.2* 9.8* 9.7*  HCT 27.7* 26.0* 28.1* 29.9* 29.6*  MCV 79.4 79.8 79.8 80.2 79.6  PLT 106* 102* 121* 173 253   Cardiac Enzymes:  Recent Labs Lab 06/04/13 1835 06/05/13 0035 06/05/13 1555 06/05/13 2313 06/06/13 0400  TROPONINI 3.89* 5.06* 4.31* 4.14* 3.58*   BNP (last 3 results)  Recent Labs  06/04/13 1235  PROBNP 22349.0*   CBG:  Recent Labs Lab 06/09/13 1820 06/09/13 2013 06/10/13 0003 06/10/13 0436 06/10/13 0747  GLUCAP 78 88 91 93 81    Recent Results (from the past 240 hour(s))  MRSA PCR SCREENING     Status: None   Collection Time    06/04/13 12:29 PM      Result Value Range Status   MRSA by PCR NEGATIVE  NEGATIVE Final   Comment:            The GeneXpert MRSA Assay (FDA     approved for NASAL specimens     only), is one component of a     comprehensive MRSA colonization     surveillance program. It is not     intended to diagnose MRSA     infection nor to guide or     monitor treatment for     MRSA infections.  URINE CULTURE     Status: None   Collection Time    06/04/13 12:50 PM      Result Value Range Status   Specimen Description URINE, CATHETERIZED   Final   Special Requests NONE   Final   Culture  Setup Time     Final   Value: 06/04/2013 12:50     Performed at  [facility] Count      Final   Value: NO GROWTH     Performed at [facility]     Final   Value: NO GROWTH     Performed at [facility]   Report Status 06/05/2013 FINAL   Final  CULTURE, BLOOD (ROUTINE X 2)     Status: None   Collection Time    06/04/13  2:10 PM      Result Value Range Status   Specimen Description BLOOD RIGHT ARM   Final   Special Requests BOTTLES DRAWN AEROBIC ONLY 10CC   Final   Culture  Setup Time     Final   Value: 06/04/2013 16:44     Performed at [facility]   Culture     Final   Value: NO GROWTH 5 DAYS     Performed at [facility]   Report Status 06/10/2013 FINAL   Final  CULTURE, BLOOD (ROUTINE X 2)     Status: None   Collection Time    06/04/13  2:55 PM      Result Value Range Status   Specimen Description BLOOD RIGHT ARM   Final   Special Requests BOTTLES DRAWN AEROBIC AND ANAEROBIC 10CC   Final   Culture  Setup Time     Final   Value: 06/04/2013 16:45     Performed at [facility]   Culture     Final   Value: NO GROWTH 5 DAYS     Performed at [facility]   Report Status 06/10/2013 FINAL   Final  BODY FLUID CULTURE     Status: None   Collection Time    06/04/13  3:58 PM      Result Value Range Status   Specimen Description FLUID   Final   Special Requests NONE   Final   Gram Stain     Final   Value: WBC PRESENT,BOTH PMN AND MONONUCLEAR     FEW GRAM NEGATIVE RODS     Performed at [facility]   Culture     Final   Value: FEW ESCHERICHIA COLI     Performed at [facility]   Report Status 06/06/2013 FINAL   Final   Organism ID, Bacteria ESCHERICHIA COLI   Final  CLOSTRIDIUM DIFFICILE BY PCR     Status: None   Collection Time    06/09/13  9:45 AM      Result Value Range Status   C difficile by pcr NEGATIVE  NEGATIVE Final     Studies:  Recent x-ray studies have been reviewed in detail by the Attending Physician  Erin Hearing, ANP [facility] Office  [phone] Pager  [phone]  **If unable to reach the above provider after paging please contact the [facility] @ [phone]  On-Call/Text Page:      [email or web address]      password [email or web address]  If 7PM-7AM, please contact night-coverage [email or web address] Password [facility] 06/10/2013, 1:36 PM   LOS: 6 days   I have examined the patient, reviewed the chart and modified the above note which I agree with.   Draden Cottingham,MD [id number] 06/10/2013, 4:48 PM

## 2013-06-10 NOTE — Progress Notes (Signed)
Patient ID: [name] [name], female   DOB: 1941-07-26, 72 y.o.   MRN: [id number]    Subjective:  Denies SSCP, palpitations or Dyspnea Right hand with terrible pain  Objective:  Filed Vitals:   06/10/13 0004 06/10/13 0400 06/10/13 0500 06/10/13 0700  BP: 139/69 134/64  159/88  Pulse: 101 96  89  Temp: 99.2 F (37.3 C) 98.5 F (36.9 C)  99.9 F (37.7 C)  TempSrc: Oral Oral  Oral  Resp: 27 27  12   Height:      Weight:   225 lb 12 oz (102.4 kg)   SpO2: 93% 92%  94%    Intake/Output from previous day:  Intake/Output Summary (Last 24 hours) at 06/10/13 0835 Last data filed at 06/10/13 0600  Gross per 24 hour  Intake 1111.95 ml  Output   3000 ml  Net -1888.05 ml    Physical Exam: Affect appropriate Elderly female HEENT: normal  Right IJ catheter has been removed with dressing applied  Neck supple with no adenopathy JVP normal no bruits no thyromegaly Lungs clear with no wheezing and good diaphragmatic motion Heart:  S1/S2 no murmur, no rub, gallop or click PMI normal Abdomen: benighn, BS positve, no tenderness, no AAA no bruit.  No HSM or HJR Distal pulses intact with no bruits No edema Neuro non-focal Skin warm and dry No muscular weakness   Lab Results: Basic Metabolic Panel:  Recent Labs  06/09/13 0500 06/10/13 0259  NA 138 139  K 3.4* 3.2*  CL 102 103  CO2 27 28  GLUCOSE 82 84  BUN 16 17  CREATININE 1.66* 1.41*  CALCIUM 8.3* 8.5  MG 1.8 1.7  PHOS 2.5 2.2*   Liver Function Tests:  Recent Labs  06/09/13 0500 06/10/13 0259  ALBUMIN 1.8* 1.9*   CBC:  Recent Labs  06/09/13 0500 06/10/13 0259  WBC 11.4* 11.1*  NEUTROABS 7.0 6.7  HGB 9.8* 9.7*  HCT 29.9* 29.6*  MCV 80.2 79.6  PLT 173 253   Cardiac Enzymes:  Imaging: Ir Nephrostomy Tube Change  06/09/2013   *RADIOLOGY REPORT*  Clinical data:  Right ureteral stricture with development of urosepsis after inadvertent   ureteral stent dislodgement, now status post percutaneous  nephrostomy drainage x 5 days, clinically improved.  ANTEGRADE NEPHROSTOGRAM URETERAL STENT PLACEMENT EXCHANGE OF RIGHT NEPHROSTOMY CATHETER UNDER FLUOROSCOPY  Technique and findings: The procedure, risks (including but not limited to bleeding, infection, organ damage), benefits, and alternatives were explained to the patient.  Questions regarding the procedure were encouraged and answered.  The patient understands and consents to the procedure.The patient placed prone. Right nephrostomy tube and surrounding skin prepped and draped usual sterile fashion. Maximal barrier sterile technique was utilized including caps, mask, sterile gowns, sterile gloves, sterile drape, hand hygiene and skin antiseptic.  The patient was receiving adequate antibiotic prophylactic coverage.  Intravenous Fentanyl and Versed were administered as conscious sedation during continuous cardiorespiratory monitoring by the radiology RN, with a total moderate sedation time of 22 minutes.  A small contrast injection through the nephrostomy catheter was performed to opacify the renal collecting system.  The catheter was cut and exchanged over [facility] wire for a 5-French Kumpe catheter, advanced for antegrade nephrostogram.  The nephrostogram demonstrated moderate filling defects within the central right renal collecting system and proximal ureter consistent with retained clot.  There is some distention of the proximal ureter.  There is a short segment tortuous ureteral segment at approximately the L4 level but the catheter passed easily  beyond the level as did the injected proximal contrast suggesting no   urodynamically significant stenosis here.  There is distention of the more distal ureter down to the level of the lower margin of the sacroiliac joint.  There is a long   tapered segment of the ureter extending several centimeters from this level to the level of the UVJ, with a small string of contrast seen with directed injection here.  The  distal stricture was easily traversed and access to the lumen of the urinary bladder was achieved.  The Kumpe was exchanged for a 24 cm 8.5 French double-J ureteral stent, deployed over an Amplatz wire with the distal end in the urinary bladder, proximal end in the right renal collecting system.  Because of residual thrombus in the right renal collecting system, an new 10-French percutaneous nephrostomy pigtail catheter was advanced and positioned centrally in the right renal collecting system.  The nephrostomy catheter was secured externally with O-Prolene suture and capped to allow internal drainage. The patient tolerated the procedure well.  No immediate complication.  IMPRESSION: 1.  Antegrade nephrostogram shows a short nondistended tortuous segment in the proximal ureter, and a long segment stricture in the distal ureter extending over several centimeters just proximal to the UVJ. 2.  Technically successful 24 cm 8.5 French double-J ureteral stent placement. 3.  Exchange of percutaneous nephrostomy catheter, capped to allow internal drainage.   Original Report Authenticated By: D. Wallace Going, MD   Ir Nephrostogram Right  06/09/2013   *RADIOLOGY REPORT*  Clinical data:  Right ureteral stricture with development of urosepsis after inadvertent   ureteral stent dislodgement, now status post percutaneous nephrostomy drainage x 5 days, clinically improved.  ANTEGRADE NEPHROSTOGRAM URETERAL STENT PLACEMENT EXCHANGE OF RIGHT NEPHROSTOMY CATHETER UNDER FLUOROSCOPY  Technique and findings: The procedure, risks (including but not limited to bleeding, infection, organ damage), benefits, and alternatives were explained to the patient.  Questions regarding the procedure were encouraged and answered.  The patient understands and consents to the procedure.The patient placed prone. Right nephrostomy tube and surrounding skin prepped and draped usual sterile fashion. Maximal barrier sterile technique was utilized including  caps, mask, sterile gowns, sterile gloves, sterile drape, hand hygiene and skin antiseptic.  The patient was receiving adequate antibiotic prophylactic coverage.  Intravenous Fentanyl and Versed were administered as conscious sedation during continuous cardiorespiratory monitoring by the radiology RN, with a total moderate sedation time of 22 minutes.  A small contrast injection through the nephrostomy catheter was performed to opacify the renal collecting system.  The catheter was cut and exchanged over [facility] wire for a 5-French Kumpe catheter, advanced for antegrade nephrostogram.  The nephrostogram demonstrated moderate filling defects within the central right renal collecting system and proximal ureter consistent with retained clot.  There is some distention of the proximal ureter.  There is a short segment tortuous ureteral segment at approximately the L4 level but the catheter passed easily beyond the level as did the injected proximal contrast suggesting no   urodynamically significant stenosis here.  There is distention of the more distal ureter down to the level of the lower margin of the sacroiliac joint.  There is a long   tapered segment of the ureter extending several centimeters from this level to the level of the UVJ, with a small string of contrast seen with directed injection here.  The distal stricture was easily traversed and access to the lumen of the urinary bladder was achieved.  The Kumpe was exchanged for a  24 cm 8.5 French double-J ureteral stent, deployed over an Amplatz wire with the distal end in the urinary bladder, proximal end in the right renal collecting system.  Because of residual thrombus in the right renal collecting system, an new 10-French percutaneous nephrostomy pigtail catheter was advanced and positioned centrally in the right renal collecting system.  The nephrostomy catheter was secured externally with O-Prolene suture and capped to allow internal drainage. The patient  tolerated the procedure well.  No immediate complication.  IMPRESSION: 1.  Antegrade nephrostogram shows a short nondistended tortuous segment in the proximal ureter, and a long segment stricture in the distal ureter extending over several centimeters just proximal to the UVJ. 2.  Technically successful 24 cm 8.5 French double-J ureteral stent placement. 3.  Exchange of percutaneous nephrostomy catheter, capped to allow internal drainage.   Original Report Authenticated By: D. Wallace Going, MD   Dg Chest Port 1 View  06/09/2013   *RADIOLOGY REPORT*  Clinical Data: Cough, hypoxia  PORTABLE CHEST - 1 VIEW  Comparison: 06/07/2013  Findings: Cardiomediastinal silhouette is stable.  Central mild bronchitic changes.  Left IJ central line is stable in position. Right IJ catheter has been removed.  Persistent small left pleural effusion with left basilar atelectasis or infiltrate.  No pulmonary edema.  IMPRESSION: Central mild bronchitic changes.  Left IJ central line is stable in position.  Right IJ catheter has been removed.  Persistent small left pleural effusion with left basilar atelectasis or infiltrate. No pulmonary edema.   Original Report Authenticated By: Lahoma Crocker, M.D.   Ir Oris Drone Cath Perc Right  06/09/2013   *RADIOLOGY REPORT*  Clinical data:  Right ureteral stricture with development of urosepsis after inadvertent   ureteral stent dislodgement, now status post percutaneous nephrostomy drainage x 5 days, clinically improved.  ANTEGRADE NEPHROSTOGRAM URETERAL STENT PLACEMENT EXCHANGE OF RIGHT NEPHROSTOMY CATHETER UNDER FLUOROSCOPY  Technique and findings: The procedure, risks (including but not limited to bleeding, infection, organ damage), benefits, and alternatives were explained to the patient.  Questions regarding the procedure were encouraged and answered.  The patient understands and consents to the procedure.The patient placed prone. Right nephrostomy tube and surrounding skin prepped and draped  usual sterile fashion. Maximal barrier sterile technique was utilized including caps, mask, sterile gowns, sterile gloves, sterile drape, hand hygiene and skin antiseptic.  The patient was receiving adequate antibiotic prophylactic coverage.  Intravenous Fentanyl and Versed were administered as conscious sedation during continuous cardiorespiratory monitoring by the radiology RN, with a total moderate sedation time of 22 minutes.  A small contrast injection through the nephrostomy catheter was performed to opacify the renal collecting system.  The catheter was cut and exchanged over [facility] wire for a 5-French Kumpe catheter, advanced for antegrade nephrostogram.  The nephrostogram demonstrated moderate filling defects within the central right renal collecting system and proximal ureter consistent with retained clot.  There is some distention of the proximal ureter.  There is a short segment tortuous ureteral segment at approximately the L4 level but the catheter passed easily beyond the level as did the injected proximal contrast suggesting no   urodynamically significant stenosis here.  There is distention of the more distal ureter down to the level of the lower margin of the sacroiliac joint.  There is a long   tapered segment of the ureter extending several centimeters from this level to the level of the UVJ, with a small string of contrast seen with directed injection here.  The distal stricture was  easily traversed and access to the lumen of the urinary bladder was achieved.  The Kumpe was exchanged for a 24 cm 8.5 French double-J ureteral stent, deployed over an Amplatz wire with the distal end in the urinary bladder, proximal end in the right renal collecting system.  Because of residual thrombus in the right renal collecting system, an new 10-French percutaneous nephrostomy pigtail catheter was advanced and positioned centrally in the right renal collecting system.  The nephrostomy catheter was secured  externally with O-Prolene suture and capped to allow internal drainage. The patient tolerated the procedure well.  No immediate complication.  IMPRESSION: 1.  Antegrade nephrostogram shows a short nondistended tortuous segment in the proximal ureter, and a long segment stricture in the distal ureter extending over several centimeters just proximal to the UVJ. 2.  Technically successful 24 cm 8.5 French double-J ureteral stent placement. 3.  Exchange of percutaneous nephrostomy catheter, capped to allow internal drainage.   Original Report Authenticated By: D. Wallace Going, MD    Cardiac Studies:  ECG:  afib nonspecific ST/T wave changes no acute ST elevation   Telemetry:  afib with good rate control 06/10/2013   Echo:  EF 45-50% only trivial MR and mild LAE study reviewed 06/10/2013   Medications:   . amitriptyline  25 mg Oral QHS  . antiseptic oral rinse  15 mL Mouth Rinse QID  . carvedilol  3.125 mg Oral BID WC  . cefTRIAXone (ROCEPHIN)  IV  2 g Intravenous Q24H  . chlorhexidine  15 mL Mouth Rinse BID  . pantoprazole  40 mg Oral Q1200  . warfarin   Does not apply Once  . Warfarin - Pharmacist Dosing Inpatient   Does not apply q1800     . amiodarone (NEXTERONE PREMIX) 360 mg/200 mL dextrose 30 mg/hr (06/10/13 0522)  . heparin 2,100 Units/hr (06/10/13 0549)    Assessment/Plan:  PAF:  History of such whenever stressed.  Continue heparin and amiodarone.  Needing unusually high dose  Pharmacy to consider AT3 Hematuria seems improved post J/J  Continue coumadin Sepsis:  Continue ceftriaxone.  Post uretal stent manipulation CAD:  No chest pain or acute ECG changes See note from Dr Acie Fredrickson  Add low dose beta blocker No indication for cath at this point Echo with mildly decreased EF but no discrete RWMA Pain:  Add Rx for gout ? Indocin / colchicine and or steroids no evidence of distal embolization and venous duplex normal post Dialysis catheter removal from right IJ  Check HIT panel   Jenkins Rouge 06/10/2013, 8:35 AM

## 2013-06-10 NOTE — Progress Notes (Addendum)
ANTICOAGULATION CONSULT NOTE - Follow Up Consult  Pharmacy Consult for Heparin/Coumadin Indication: chest pain/ACS and afib  Allergies  Allergen Reactions  . Codeine Nausea Only    hallucinations  . Hydrocodone Nausea Only    hallucinations  . Macrodantin [Nitrofurantoin] Nausea And Vomiting    Patient Measurements: Height: 5\' 7"  (170.2 cm) Weight: 225 lb 12 oz (102.4 kg) IBW/kg (Calculated) : 61.6 Heparin Dosing Weight: 85kg  Vital Signs: Temp: 98.5 F (36.9 C) (08/15 0400) Temp src: Oral (08/15 0400) BP: 134/64 mmHg (08/15 0400) Pulse Rate: 96 (08/15 0400)  Labs:  Recent Labs  06/08/13 0625 06/08/13 1112 06/08/13 2015 06/09/13 0500 06/10/13 0259  HGB 9.2*  --   --  9.8* 9.7*  HCT 28.1*  --   --  29.9* 29.6*  PLT 121*  --   --  173 253  APTT 34  --   --   --   --   LABPROT  --   --  13.2 13.9 15.4*  INR  --   --  1.02 1.09 1.25  HEPARINUNFRC  --  <0.10* <0.10*  --  <0.10*  CREATININE 1.84*  --   --  1.66* 1.41*    Estimated Creatinine Clearance: 45 ml/min (by C-G formula based on Cr of 1.41).   Medications:  Scheduled:  . amitriptyline  25 mg Oral QHS  . antiseptic oral rinse  15 mL Mouth Rinse QID  . carvedilol  3.125 mg Oral BID WC  . cefTRIAXone (ROCEPHIN)  IV  2 g Intravenous Q24H  . chlorhexidine  15 mL Mouth Rinse BID  . pantoprazole  40 mg Oral Q1200  . warfarin   Does not apply Once  . Warfarin - Pharmacist Dosing Inpatient   Does not apply q1800    Assessment:  Heparin remains undetectable at 1800 units/hr. Blood still present in urine and nephrostomy tube. No bolus. If undetectable on 2100 units/hr may consider AT III deficiency or changing to lovenox.   Goal of Therapy:  Heparin level 0.3-0.7 units/ml   Plan:  Increase heparin to 2100 units/hr and re-check HL in 8 hours  [name] [name]

## 2013-06-10 NOTE — Progress Notes (Signed)
Subjective: Patient reports internalization of Right JJ stent yesterday with ease; gross hematuria; new onset R thumb pain.  I have reviewed the nephrostogram and magnified ureterogram , and note the enolgated stricture, whtich apears to involve the entire lower ureter from the pelvic brim to the orifice. This would neccitate consideration of uretral reimplantation with Boari flap-if possible. i will review wit 2nd opinions. Ay be too long a stricture. She has JJ in for now, which affords her safety. Anticipate hematuria 2ndary heprin/coumadin.   Objective: Vital signs in last 24 hours: Temp:  [98.1 F (36.7 C)-99.9 F (37.7 C)] 99.9 F (37.7 C) (08/15 0700) Pulse Rate:  [84-106] 89 (08/15 0700) Resp:  [12-27] 12 (08/15 0700) BP: (119-159)/(64-88) 159/88 mmHg (08/15 0700) SpO2:  [89 %-99 %] 94 % (08/15 0700) Weight:  [102.4 kg (225 lb 12 oz)] 102.4 kg (225 lb 12 oz) (08/15 0500)  Intake/Output from previous day: 08/14 0701 - 08/15 0700 In: 1166.7 [P.O.:150; I.V.:1016.7] Out: 3150 [Urine:3000; Stool:150] Intake/Output this shift:    Physical Exam:  General:alert and cooperative GI: not done and soft, non tender, normal bowel sounds, no palpable masses, no organomegaly, no inguinal hernia Female genitalia: not done normal external genitalia, vulva, vagina, cervix, uterus and adnexa Extremities: Homans sign is negative, no sign of DVT  Lab Results:  Recent Labs  06/08/13 0625 06/09/13 0500 06/10/13 0259  HGB 9.2* 9.8* 9.7*  HCT 28.1* 29.9* 29.6*   BMET  Recent Labs  06/09/13 0500 06/10/13 0259  NA 138 139  K 3.4* 3.2*  CL 102 103  CO2 27 28  GLUCOSE 82 84  BUN 16 17  CREATININE 1.66* 1.41*  CALCIUM 8.3* 8.5    Recent Labs  06/08/13 2015 06/09/13 0500 06/10/13 0259  INR 1.02 1.09 1.25   No results found for this basename: LABURIN,  in the last 72 hours Results for orders placed during the hospital encounter of 06/04/13  MRSA PCR SCREENING     Status:  None   Collection Time    06/04/13 12:29 PM      Result Value Range Status   MRSA by PCR NEGATIVE  NEGATIVE Final   Comment:            The GeneXpert MRSA Assay (FDA     approved for NASAL specimens     only), is one component of a     comprehensive MRSA colonization     surveillance program. It is not     intended to diagnose MRSA     infection nor to guide or     monitor treatment for     MRSA infections.  URINE CULTURE     Status: None   Collection Time    06/04/13 12:50 PM      Result Value Range Status   Specimen Description URINE, CATHETERIZED   Final   Special Requests NONE   Final   Culture  Setup Time     Final   Value: 06/04/2013 12:50     Performed at [place]     Final   Value: NO GROWTH     Performed at [facility]   Culture     Final   Value: NO GROWTH     Performed at [facility]   Report Status 06/05/2013 FINAL   Final  CULTURE, BLOOD (ROUTINE X 2)     Status: None   Collection Time    06/04/13  2:10 PM  Result Value Range Status   Specimen Description BLOOD RIGHT ARM   Final   Special Requests BOTTLES DRAWN AEROBIC ONLY 10CC   Final   Culture  Setup Time     Final   Value: 06/04/2013 16:44     Performed at [facility]   Culture     Final   Value:        BLOOD CULTURE RECEIVED NO GROWTH TO DATE CULTURE WILL BE HELD FOR 5 DAYS BEFORE ISSUING A FINAL NEGATIVE REPORT     Performed at [facility]   Report Status PENDING   Incomplete  CULTURE, BLOOD (ROUTINE X 2)     Status: None   Collection Time    06/04/13  2:55 PM      Result Value Range Status   Specimen Description BLOOD RIGHT ARM   Final   Special Requests BOTTLES DRAWN AEROBIC AND ANAEROBIC 10CC   Final   Culture  Setup Time     Final   Value: 06/04/2013 16:45     Performed at [facility]   Culture     Final   Value:        BLOOD CULTURE RECEIVED NO GROWTH TO DATE CULTURE WILL BE HELD FOR 5 DAYS BEFORE ISSUING A FINAL  NEGATIVE REPORT     Performed at [facility]   Report Status PENDING   Incomplete  BODY FLUID CULTURE     Status: None   Collection Time    06/04/13  3:58 PM      Result Value Range Status   Specimen Description FLUID   Final   Special Requests NONE   Final   Gram Stain     Final   Value: WBC PRESENT,BOTH PMN AND MONONUCLEAR     FEW GRAM NEGATIVE RODS     Performed at [facility]   Culture     Final   Value: FEW ESCHERICHIA COLI     Performed at [facility]   Report Status 06/06/2013 FINAL   Final   Organism ID, Bacteria ESCHERICHIA COLI   Final  CLOSTRIDIUM DIFFICILE BY PCR     Status: None   Collection Time    06/09/13  9:45 AM      Result Value Range Status   C difficile by pcr NEGATIVE  NEGATIVE Final    Studies/Results: Ir Nephrostomy Tube Change  06/09/2013   *RADIOLOGY REPORT*  Clinical data:  Right ureteral stricture with development of urosepsis after inadvertent   ureteral stent dislodgement, now status post percutaneous nephrostomy drainage x 5 days, clinically improved.  ANTEGRADE NEPHROSTOGRAM URETERAL STENT PLACEMENT EXCHANGE OF RIGHT NEPHROSTOMY CATHETER UNDER FLUOROSCOPY  Technique and findings: The procedure, risks (including but not limited to bleeding, infection, organ damage), benefits, and alternatives were explained to the patient.  Questions regarding the procedure were encouraged and answered.  The patient understands and consents to the procedure.The patient placed prone. Right nephrostomy tube and surrounding skin prepped and draped usual sterile fashion. Maximal barrier sterile technique was utilized including caps, mask, sterile gowns, sterile gloves, sterile drape, hand hygiene and skin antiseptic.  The patient was receiving adequate antibiotic prophylactic coverage.  Intravenous Fentanyl and Versed were administered as conscious sedation during continuous cardiorespiratory monitoring by the radiology RN, with a total moderate  sedation time of 22 minutes.  A small contrast injection through the nephrostomy catheter was performed to opacify the renal collecting system.  The catheter was cut and exchanged over  Benson wire for a 5-French Kumpe catheter, advanced for antegrade nephrostogram.  The nephrostogram demonstrated moderate filling defects within the central right renal collecting system and proximal ureter consistent with retained clot.  There is some distention of the proximal ureter.  There is a short segment tortuous ureteral segment at approximately the L4 level but the catheter passed easily beyond the level as did the injected proximal contrast suggesting no   urodynamically significant stenosis here.  There is distention of the more distal ureter down to the level of the lower margin of the sacroiliac joint.  There is a long   tapered segment of the ureter extending several centimeters from this level to the level of the UVJ, with a small string of contrast seen with directed injection here.  The distal stricture was easily traversed and access to the lumen of the urinary bladder was achieved.  The Kumpe was exchanged for a 24 cm 8.5 French double-J ureteral stent, deployed over an Amplatz wire with the distal end in the urinary bladder, proximal end in the right renal collecting system.  Because of residual thrombus in the right renal collecting system, an new 10-French percutaneous nephrostomy pigtail catheter was advanced and positioned centrally in the right renal collecting system.  The nephrostomy catheter was secured externally with O-Prolene suture and capped to allow internal drainage. The patient tolerated the procedure well.  No immediate complication.  IMPRESSION: 1.  Antegrade nephrostogram shows a short nondistended tortuous segment in the proximal ureter, and a long segment stricture in the distal ureter extending over several centimeters just proximal to the UVJ. 2.  Technically successful 24 cm 8.5 French  double-J ureteral stent placement. 3.  Exchange of percutaneous nephrostomy catheter, capped to allow internal drainage.   Original Report Authenticated By: D. Wallace Going, MD   Ir Nephrostogram Right  06/09/2013   *RADIOLOGY REPORT*  Clinical data:  Right ureteral stricture with development of urosepsis after inadvertent   ureteral stent dislodgement, now status post percutaneous nephrostomy drainage x 5 days, clinically improved.  ANTEGRADE NEPHROSTOGRAM URETERAL STENT PLACEMENT EXCHANGE OF RIGHT NEPHROSTOMY CATHETER UNDER FLUOROSCOPY  Technique and findings: The procedure, risks (including but not limited to bleeding, infection, organ damage), benefits, and alternatives were explained to the patient.  Questions regarding the procedure were encouraged and answered.  The patient understands and consents to the procedure.The patient placed prone. Right nephrostomy tube and surrounding skin prepped and draped usual sterile fashion. Maximal barrier sterile technique was utilized including caps, mask, sterile gowns, sterile gloves, sterile drape, hand hygiene and skin antiseptic.  The patient was receiving adequate antibiotic prophylactic coverage.  Intravenous Fentanyl and Versed were administered as conscious sedation during continuous cardiorespiratory monitoring by the radiology RN, with a total moderate sedation time of 22 minutes.  A small contrast injection through the nephrostomy catheter was performed to opacify the renal collecting system.  The catheter was cut and exchanged over [facility] wire for a 5-French Kumpe catheter, advanced for antegrade nephrostogram.  The nephrostogram demonstrated moderate filling defects within the central right renal collecting system and proximal ureter consistent with retained clot.  There is some distention of the proximal ureter.  There is a short segment tortuous ureteral segment at approximately the L4 level but the catheter passed easily beyond the level as did the  injected proximal contrast suggesting no   urodynamically significant stenosis here.  There is distention of the more distal ureter down to the level of the lower margin of the sacroiliac joint.  There is a long   tapered segment of the ureter extending several centimeters from this level to the level of the UVJ, with a small string of contrast seen with directed injection here.  The distal stricture was easily traversed and access to the lumen of the urinary bladder was achieved.  The Kumpe was exchanged for a 24 cm 8.5 French double-J ureteral stent, deployed over an Amplatz wire with the distal end in the urinary bladder, proximal end in the right renal collecting system.  Because of residual thrombus in the right renal collecting system, an new 10-French percutaneous nephrostomy pigtail catheter was advanced and positioned centrally in the right renal collecting system.  The nephrostomy catheter was secured externally with O-Prolene suture and capped to allow internal drainage. The patient tolerated the procedure well.  No immediate complication.  IMPRESSION: 1.  Antegrade nephrostogram shows a short nondistended tortuous segment in the proximal ureter, and a long segment stricture in the distal ureter extending over several centimeters just proximal to the UVJ. 2.  Technically successful 24 cm 8.5 French double-J ureteral stent placement. 3.  Exchange of percutaneous nephrostomy catheter, capped to allow internal drainage.   Original Report Authenticated By: D. Wallace Going, MD   Dg Chest Port 1 View  06/09/2013   *RADIOLOGY REPORT*  Clinical Data: Cough, hypoxia  PORTABLE CHEST - 1 VIEW  Comparison: 06/07/2013  Findings: Cardiomediastinal silhouette is stable.  Central mild bronchitic changes.  Left IJ central line is stable in position. Right IJ catheter has been removed.  Persistent small left pleural effusion with left basilar atelectasis or infiltrate.  No pulmonary edema.  IMPRESSION: Central mild  bronchitic changes.  Left IJ central line is stable in position.  Right IJ catheter has been removed.  Persistent small left pleural effusion with left basilar atelectasis or infiltrate. No pulmonary edema.   Original Report Authenticated By: Lahoma Crocker, M.D.   Ir Oris Drone Cath Perc Right  06/09/2013   *RADIOLOGY REPORT*  Clinical data:  Right ureteral stricture with development of urosepsis after inadvertent   ureteral stent dislodgement, now status post percutaneous nephrostomy drainage x 5 days, clinically improved.  ANTEGRADE NEPHROSTOGRAM URETERAL STENT PLACEMENT EXCHANGE OF RIGHT NEPHROSTOMY CATHETER UNDER FLUOROSCOPY  Technique and findings: The procedure, risks (including but not limited to bleeding, infection, organ damage), benefits, and alternatives were explained to the patient.  Questions regarding the procedure were encouraged and answered.  The patient understands and consents to the procedure.The patient placed prone. Right nephrostomy tube and surrounding skin prepped and draped usual sterile fashion. Maximal barrier sterile technique was utilized including caps, mask, sterile gowns, sterile gloves, sterile drape, hand hygiene and skin antiseptic.  The patient was receiving adequate antibiotic prophylactic coverage.  Intravenous Fentanyl and Versed were administered as conscious sedation during continuous cardiorespiratory monitoring by the radiology RN, with a total moderate sedation time of 22 minutes.  A small contrast injection through the nephrostomy catheter was performed to opacify the renal collecting system.  The catheter was cut and exchanged over [facility] wire for a 5-French Kumpe catheter, advanced for antegrade nephrostogram.  The nephrostogram demonstrated moderate filling defects within the central right renal collecting system and proximal ureter consistent with retained clot.  There is some distention of the proximal ureter.  There is a short segment tortuous ureteral segment at  approximately the L4 level but the catheter passed easily beyond the level as did the injected proximal contrast suggesting no   urodynamically significant stenosis here.  There  is distention of the more distal ureter down to the level of the lower margin of the sacroiliac joint.  There is a long   tapered segment of the ureter extending several centimeters from this level to the level of the UVJ, with a small string of contrast seen with directed injection here.  The distal stricture was easily traversed and access to the lumen of the urinary bladder was achieved.  The Kumpe was exchanged for a 24 cm 8.5 French double-J ureteral stent, deployed over an Amplatz wire with the distal end in the urinary bladder, proximal end in the right renal collecting system.  Because of residual thrombus in the right renal collecting system, an new 10-French percutaneous nephrostomy pigtail catheter was advanced and positioned centrally in the right renal collecting system.  The nephrostomy catheter was secured externally with O-Prolene suture and capped to allow internal drainage. The patient tolerated the procedure well.  No immediate complication.  IMPRESSION: 1.  Antegrade nephrostogram shows a short nondistended tortuous segment in the proximal ureter, and a long segment stricture in the distal ureter extending over several centimeters just proximal to the UVJ. 2.  Technically successful 24 cm 8.5 French double-J ureteral stent placement. 3.  Exchange of percutaneous nephrostomy catheter, capped to allow internal drainage.   Original Report Authenticated By: D. Wallace Going, MD    Assessment/Plan: Hematuria Continue foley due to output monitoring.  Anticipate leaving the [place] in place for several weeks/months. She may just end up with JJ changes to keep the R ureter open vs surgical re-implantation. For now , she needs medical Rx of her infection, her acute thumb pain, and her a. Fib.    LOS: 6 days   Frady Taddeo,  Kota Ciancio I 06/10/2013, 8:01 AM

## 2013-06-10 NOTE — Progress Notes (Signed)
ANTICOAGULATION CONSULT NOTE - Follow Up Consult  Pharmacy Consult for Heparin/Coumadin Indication: NSTEMI and Afib  Allergies  Allergen Reactions  . Codeine Nausea Only    hallucinations  . Hydrocodone Nausea Only    hallucinations  . Macrodantin [Nitrofurantoin] Nausea And Vomiting    Patient Measurements: Height: 5\' 7"  (170.2 cm) Weight: 225 lb 12 oz (102.4 kg) IBW/kg (Calculated) : 61.6 Heparin Dosing Weight: 85 kg  Vital Signs: Temp: 99.1 F (37.3 C) (08/15 2057) Temp src: Oral (08/15 2057) BP: 103/69 mmHg (08/15 2057) Pulse Rate: 109 (08/15 2057)  Labs:  Recent Labs  06/08/13 0625  06/08/13 2015 06/09/13 0500 06/10/13 0259 06/10/13 1429 06/10/13 2017  HGB 9.2*  --   --  9.8* 9.7*  --   --   HCT 28.1*  --   --  29.9* 29.6*  --   --   PLT 121*  --   --  173 253  --   --   APTT 34  --   --   --   --   --   --   LABPROT  --   --  13.2 13.9 15.4*  --   --   INR  --   --  1.02 1.09 1.25  --   --   HEPARINUNFRC  --   < > <0.10*  --  <0.10* 0.42 0.40  CREATININE 1.84*  --   --  1.66* 1.41*  --   --   < > = values in this interval not displayed.  Estimated Creatinine Clearance: 45 ml/min (by C-G formula based on Cr of 1.41).   Medications:  . amiodarone (NEXTERONE PREMIX) 360 mg/200 mL dextrose 30 mg/hr (06/10/13 2055)  . heparin 2,100 Units/hr (06/10/13 1800)    Assessment:  Patient continues on heparin and Coumadin for NSTEMI and Afib. Heparin level remains therapeutic (HL= 0.4) at 2100 units/hr.   Goal of Therapy:  Heparin level 0.3-0.7 units/ml  Monitor platelets by anticoagulation protocol: Yes   Plan:   -Continue heparin drip at 2100 units/hr -Heparin level and CBC in am  Hildred Laser, Pharm D 06/10/2013 9:03 PM

## 2013-06-10 NOTE — Progress Notes (Signed)
Physical Therapy Treatment Patient Details Name: [name] [name] MRN: [id number] DOB: 1940/11/29 Today's Date: 06/10/2013 Time: [id number] PT Time Calculation (min): 15 min  PT Assessment / Plan / Recommendation  History of Present Illness 72 yo female with hx kidney stones, frequent UTI, HTN tx 8/9 from [facility] with septic shock, acute renal failure and R pyelonephritis after failed ureteral stent placement 8/7.  S/p R perc nephrostomy 8/9. CVVHD.    PT Comments   Patient declined to work with PT today due to pain in Rt hand.  After encouragement, patient did agree to do LE strengthening exercises.  Progress minimized due to hand pain.  Follow Up Recommendations  Home health PT;Supervision for mobility/OOB     Does the patient have the potential to tolerate intense rehabilitation     Barriers to Discharge        Equipment Recommendations  None recommended by PT    Recommendations for Other Services OT consult  Frequency Min 3X/week   Progress towards PT Goals Progress towards PT goals: Not progressing toward goals - comment (Due to pain in Rt hand today)  Plan Current plan remains appropriate    Precautions / Restrictions Precautions Precautions: Fall Precaution Comments: nephrostomy drain Restrictions Weight Bearing Restrictions: No   Pertinent Vitals/Pain Pain 10/10 in rt. Hand, limiting mobility    Mobility  Bed Mobility Bed Mobility: Not assessed (Patient declined)    Exercises General Exercises - Lower Extremity Ankle Circles/Pumps: AROM;Both;10 reps;Supine Gluteal Sets: AROM;Both;10 reps;Supine Short Arc Quad: AROM;Both;10 reps;Supine Heel Slides: AROM;AAROM;Both;10 reps;Supine (Needed assist with RLE) Hip ABduction/ADduction: AROM;AAROM;Both;10 reps;Supine (Assist with RLE) Straight Leg Raises: AROM;AAROM;Both;10 reps;Supine (Assist with RLE)     PT Goals (current goals can now be found in the care plan section)    Visit Information  Last PT  Received On: 06/10/13 Assistance Needed: +1 History of Present Illness: 72 yo female with hx kidney stones, frequent UTI, HTN tx 8/9 from [facility] with septic shock, acute renal failure and R pyelonephritis after failed ureteral stent placement 8/7.  S/p R perc nephrostomy 8/9. CVVHD.     Subjective Data  Subjective: "I can't use my hand.  I can't walk"   Cognition  Cognition Arousal/Alertness: Awake/alert Behavior During Therapy: WFL for tasks assessed/performed Overall Cognitive Status: Within Functional Limits for tasks assessed    Balance     End of Session PT - End of Session Activity Tolerance: Patient tolerated treatment well Patient left: in bed;with call bell/phone within reach   GP     Despina Pole 06/10/2013, 4:19 PM Carita Pian. Sanjuana Kava, [place] Pager [phone]

## 2013-06-11 DIAGNOSIS — R1110 Vomiting, unspecified: Secondary | ICD-10-CM | POA: Diagnosis not present

## 2013-06-11 DIAGNOSIS — A498 Other bacterial infections of unspecified site: Secondary | ICD-10-CM

## 2013-06-11 DIAGNOSIS — R197 Diarrhea, unspecified: Secondary | ICD-10-CM | POA: Diagnosis not present

## 2013-06-11 DIAGNOSIS — R7881 Bacteremia: Secondary | ICD-10-CM

## 2013-06-11 HISTORY — DX: Vomiting, unspecified: R11.10

## 2013-06-11 HISTORY — DX: Diarrhea, unspecified: R19.7

## 2013-06-11 LAB — RENAL FUNCTION PANEL
Albumin: 2 g/dL — ABNORMAL LOW (ref 3.5–5.2)
BUN: 17 mg/dL (ref 6–23)
CO2: 26 mEq/L (ref 19–32)
Calcium: 8.4 mg/dL (ref 8.4–10.5)
Chloride: 104 mEq/L (ref 96–112)
Creatinine, Ser: 1.28 mg/dL — ABNORMAL HIGH (ref 0.50–1.10)
GFR calc Af Amer: 48 mL/min — ABNORMAL LOW (ref >90)
GFR calc non Af Amer: 41 mL/min — ABNORMAL LOW (ref >90)
Glucose, Bld: 134 mg/dL — ABNORMAL HIGH (ref 70–99)
Phosphorus: 2.8 mg/dL (ref 2.3–4.6)
Potassium: 3.5 mEq/L (ref 3.5–5.1)
Sodium: 139 mEq/L (ref 135–145)

## 2013-06-11 LAB — CBC WITH DIFFERENTIAL/PLATELET
Basophils Absolute: 0 10*3/uL (ref 0.0–0.1)
Basophils Relative: 0 % (ref 0–1)
Eosinophils Absolute: 0 10*3/uL (ref 0.0–0.7)
Eosinophils Relative: 0 % (ref 0–5)
HCT: 27.5 % — ABNORMAL LOW (ref 36.0–46.0)
Hemoglobin: 9.2 g/dL — ABNORMAL LOW (ref 12.0–15.0)
Lymphocytes Relative: 9 % — ABNORMAL LOW (ref 12–46)
Lymphs Abs: 1.5 10*3/uL (ref 0.7–4.0)
MCH: 26 pg (ref 26.0–34.0)
MCHC: 33.5 g/dL (ref 30.0–36.0)
MCV: 77.7 fL — ABNORMAL LOW (ref 78.0–100.0)
Monocytes Absolute: 1.5 10*3/uL — ABNORMAL HIGH (ref 0.1–1.0)
Monocytes Relative: 9 % (ref 3–12)
Neutro Abs: 13.7 10*3/uL — ABNORMAL HIGH (ref 1.7–7.7)
Neutrophils Relative %: 82 % — ABNORMAL HIGH (ref 43–77)
Platelets: 365 10*3/uL (ref 150–400)
RBC: 3.54 MIL/uL — ABNORMAL LOW (ref 3.87–5.11)
RDW: 15.3 % (ref 11.5–15.5)
WBC: 16.7 10*3/uL — ABNORMAL HIGH (ref 4.0–10.5)

## 2013-06-11 LAB — GLUCOSE, CAPILLARY: Glucose-Capillary: 148 mg/dL — ABNORMAL HIGH (ref 70–99)

## 2013-06-11 LAB — HEPARIN LEVEL (UNFRACTIONATED)
Heparin Unfractionated: 0.75 IU/mL — ABNORMAL HIGH (ref 0.30–0.70)
Heparin Unfractionated: 0.66 IU/mL (ref 0.30–0.70)

## 2013-06-11 LAB — PROTIME-INR
INR: 1.76 — ABNORMAL HIGH (ref 0.00–1.49)
Prothrombin Time: 20 seconds — ABNORMAL HIGH (ref 11.6–15.2)

## 2013-06-11 LAB — MAGNESIUM: Magnesium: 1.9 mg/dL (ref 1.5–2.5)

## 2013-06-11 MED ORDER — WARFARIN SODIUM 2.5 MG PO TABS
2.5000 mg | ORAL_TABLET | Freq: Once | ORAL | Status: AC
Start: 2013-06-11 — End: 2013-06-11
  Administered 2013-06-11: 2.5 mg via ORAL
  Filled 2013-06-11: qty 1

## 2013-06-11 MED ORDER — PREDNISONE 20 MG PO TABS
40.0000 mg | ORAL_TABLET | Freq: Every day | ORAL | Status: AC
  Administered 2013-06-12 – 2013-06-14 (×3): 40 mg via ORAL
  Filled 2013-06-11 (×3): qty 2

## 2013-06-11 MED ORDER — POTASSIUM CHLORIDE CRYS ER 20 MEQ PO TBCR
40.0000 meq | EXTENDED_RELEASE_TABLET | Freq: Once | ORAL | Status: AC
  Administered 2013-06-11: 40 meq via ORAL
  Filled 2013-06-11: qty 2

## 2013-06-11 MED ORDER — DIPHENOXYLATE-ATROPINE 2.5-0.025 MG PO TABS
2.0000 | ORAL_TABLET | ORAL | Status: AC
  Administered 2013-06-11: 2 via ORAL
  Filled 2013-06-11: qty 2

## 2013-06-11 MED ORDER — NYSTATIN 100000 UNIT/GM EX CREA
TOPICAL_CREAM | Freq: Two times a day (BID) | CUTANEOUS | Status: DC
  Administered 2013-06-11 – 2013-06-12 (×3): via TOPICAL
  Administered 2013-06-13: 1 via TOPICAL
  Administered 2013-06-13 – 2013-06-15 (×5): via TOPICAL
  Filled 2013-06-11 (×3): qty 15

## 2013-06-11 NOTE — Progress Notes (Addendum)
ANTICOAGULATION CONSULT NOTE - Follow Up Consult  Pharmacy Consult for Heparin Indication: atrial fibrillation and NSTEMI  Allergies  Allergen Reactions  . Codeine Nausea Only    hallucinations  . Hydrocodone Nausea Only    hallucinations  . Macrodantin [Nitrofurantoin] Nausea And Vomiting    Patient Measurements: Height: 5\' 7"  (170.2 cm) Weight: 218 lb 11.1 oz (99.2 kg) IBW/kg (Calculated) : 61.6 Heparin Dosing Weight: 85 kg  Vital Signs: Temp: 98 F (36.7 C) (08/16 1152) Temp src: Oral (08/16 1152) BP: 127/60 mmHg (08/16 1400) Pulse Rate: 103 (08/16 1400)  Labs:  Recent Labs  06/09/13 0500 06/10/13 0259  06/10/13 2017 06/11/13 0600 06/11/13 1612  HGB 9.8* 9.7*  --   --  9.2*  --   HCT 29.9* 29.6*  --   --  27.5*  --   PLT 173 253  --   --  365  --   LABPROT 13.9 15.4*  --   --  20.0*  --   INR 1.09 1.25  --   --  1.76*  --   HEPARINUNFRC  --  <0.10*  < > 0.40 0.75* 0.66  CREATININE 1.66* 1.41*  --   --  1.28*  --   < > = values in this interval not displayed.  Estimated Creatinine Clearance: 48.7 ml/min (by C-G formula based on Cr of 1.28).   Medications:  Heparin at 1950 units/hr  Assessment: 72 y/o on heparin/warfarin bridge for NSTEMI/Afib. HL at 1600 is 0.66. CBC low and stable. Some hematuria was mentioned in previous notes, per Urology note this morning: doesn't appear to have significant GUI bleeding given relatively stable Hgb. Scr 1.28 with CrCl ~ 45-50.   Goal of Therapy:  Heparin level 0.3-0.7 units/mL Monitor platelets by anticoagulation protocol: Yes   Plan:  -Continue heparin drip at 1950 units/hr -Check 8 hour HL at 0100 -Daily CBC/HL -Monitor for any progress in hematuria  -Warfarin per previous note  Thank you for allowing me to take part in this patient's care,  Narda Bonds, PharmD Clinical Pharmacist Phone: [phone] Pager: [phone] 06/11/2013 5:12 PM  Addendum:  Confirmatory heparin level (0.52) remains therapeutic -  continue current heparin rate 1950 units/hr and follow-up AM labs. No significant bleeding reported (hematuria improving per RN).  Janina Mayo, PharmD Clinical Pharmacist [phone] 06/12/2013, 3:29 AM

## 2013-06-11 NOTE — Progress Notes (Signed)
Subjective: [name] [name] PCN placed 8/9 R PCN capped 8/14- JJ placed 8/14 Doing better daily  Objective: Vital signs in last 24 hours: Temp:  [98.3 F (36.8 C)-99.1 F (37.3 C)] 98.3 F (36.8 C) (08/16 0400) Pulse Rate:  [85-125] 85 (08/16 0645) Resp:  [15-22] 18 (08/16 0400) BP: (103-139)/(56-89) 122/72 mmHg (08/16 0645) SpO2:  [93 %-97 %] 97 % (08/16 0400) Weight:  [218 lb 11.1 oz (99.2 kg)] 218 lb 11.1 oz (99.2 kg) (08/16 0500) Last BM Date: 06/10/13  Intake/Output from previous day: 08/15 0701 - 08/16 0700 In: 792.4 [P.O.:100; I.V.:692.4] Out: 2900 [Urine:2900] Intake/Output this shift:    PE:  Afeb; VSS UOP less bloody 2.3 L yesterday R PCN capped; NT no bleeding; NT Bun/CR: 17/1.28 (17/1.41)  Lab Results:   Recent Labs  06/10/13 0259 06/11/13 0600  WBC 11.1* 16.7*  HGB 9.7* 9.2*  HCT 29.6* 27.5*  PLT 253 365   BMET  Recent Labs  06/10/13 0259 06/11/13 0600  NA 139 139  K 3.2* 3.5  CL 103 104  CO2 28 26  GLUCOSE 84 134*  BUN 17 17  CREATININE 1.41* 1.28*  CALCIUM 8.5 8.4   PT/INR  Recent Labs  06/10/13 0259 06/11/13 0600  LABPROT 15.4* 20.0*  INR 1.25 1.76*   ABG No results found for this basename: PHART, PCO2, PO2, HCO3,  in the last 72 hours  Studies/Results: Ir Nephrostomy Tube Change  06/09/2013   *RADIOLOGY REPORT*  Clinical data:  Right ureteral stricture with development of urosepsis after inadvertent   ureteral stent dislodgement, now status post percutaneous nephrostomy drainage x 5 days, clinically improved.  ANTEGRADE NEPHROSTOGRAM URETERAL STENT PLACEMENT EXCHANGE OF RIGHT NEPHROSTOMY CATHETER UNDER FLUOROSCOPY  Technique and findings: The procedure, risks (including but not limited to bleeding, infection, organ damage), benefits, and alternatives were explained to the patient.  Questions regarding the procedure were encouraged and answered.  The patient understands and consents to the procedure.The patient placed  prone. Right nephrostomy tube and surrounding skin prepped and draped usual sterile fashion. Maximal barrier sterile technique was utilized including caps, mask, sterile gowns, sterile gloves, sterile drape, hand hygiene and skin antiseptic.  The patient was receiving adequate antibiotic prophylactic coverage.  Intravenous Fentanyl and Versed were administered as conscious sedation during continuous cardiorespiratory monitoring by the radiology RN, with a total moderate sedation time of 22 minutes.  A small contrast injection through the nephrostomy catheter was performed to opacify the renal collecting system.  The catheter was cut and exchanged over [facility] wire for a 5-French Kumpe catheter, advanced for antegrade nephrostogram.  The nephrostogram demonstrated moderate filling defects within the central right renal collecting system and proximal ureter consistent with retained clot.  There is some distention of the proximal ureter.  There is a short segment tortuous ureteral segment at approximately the L4 level but the catheter passed easily beyond the level as did the injected proximal contrast suggesting no   urodynamically significant stenosis here.  There is distention of the more distal ureter down to the level of the lower margin of the sacroiliac joint.  There is a long   tapered segment of the ureter extending several centimeters from this level to the level of the UVJ, with a small string of contrast seen with directed injection here.  The distal stricture was easily traversed and access to the lumen of the urinary bladder was achieved.  The Kumpe was exchanged for a 24 cm 8.5 French double-J ureteral stent, deployed over an  Amplatz wire with the distal end in the urinary bladder, proximal end in the right renal collecting system.  Because of residual thrombus in the right renal collecting system, an new 10-French percutaneous nephrostomy pigtail catheter was advanced and positioned centrally in the  right renal collecting system.  The nephrostomy catheter was secured externally with O-Prolene suture and capped to allow internal drainage. The patient tolerated the procedure well.  No immediate complication.  IMPRESSION: 1.  Antegrade nephrostogram shows a short nondistended tortuous segment in the proximal ureter, and a long segment stricture in the distal ureter extending over several centimeters just proximal to the UVJ. 2.  Technically successful 24 cm 8.5 French double-J ureteral stent placement. 3.  Exchange of percutaneous nephrostomy catheter, capped to allow internal drainage.   Original Report Authenticated By: D. Wallace Going, MD   Ir Nephrostogram Right  06/09/2013   *RADIOLOGY REPORT*  Clinical data:  Right ureteral stricture with development of urosepsis after inadvertent   ureteral stent dislodgement, now status post percutaneous nephrostomy drainage x 5 days, clinically improved.  ANTEGRADE NEPHROSTOGRAM URETERAL STENT PLACEMENT EXCHANGE OF RIGHT NEPHROSTOMY CATHETER UNDER FLUOROSCOPY  Technique and findings: The procedure, risks (including but not limited to bleeding, infection, organ damage), benefits, and alternatives were explained to the patient.  Questions regarding the procedure were encouraged and answered.  The patient understands and consents to the procedure.The patient placed prone. Right nephrostomy tube and surrounding skin prepped and draped usual sterile fashion. Maximal barrier sterile technique was utilized including caps, mask, sterile gowns, sterile gloves, sterile drape, hand hygiene and skin antiseptic.  The patient was receiving adequate antibiotic prophylactic coverage.  Intravenous Fentanyl and Versed were administered as conscious sedation during continuous cardiorespiratory monitoring by the radiology RN, with a total moderate sedation time of 22 minutes.  A small contrast injection through the nephrostomy catheter was performed to opacify the renal collecting system.   The catheter was cut and exchanged over [facility] wire for a 5-French Kumpe catheter, advanced for antegrade nephrostogram.  The nephrostogram demonstrated moderate filling defects within the central right renal collecting system and proximal ureter consistent with retained clot.  There is some distention of the proximal ureter.  There is a short segment tortuous ureteral segment at approximately the L4 level but the catheter passed easily beyond the level as did the injected proximal contrast suggesting no   urodynamically significant stenosis here.  There is distention of the more distal ureter down to the level of the lower margin of the sacroiliac joint.  There is a long   tapered segment of the ureter extending several centimeters from this level to the level of the UVJ, with a small string of contrast seen with directed injection here.  The distal stricture was easily traversed and access to the lumen of the urinary bladder was achieved.  The Kumpe was exchanged for a 24 cm 8.5 French double-J ureteral stent, deployed over an Amplatz wire with the distal end in the urinary bladder, proximal end in the right renal collecting system.  Because of residual thrombus in the right renal collecting system, an new 10-French percutaneous nephrostomy pigtail catheter was advanced and positioned centrally in the right renal collecting system.  The nephrostomy catheter was secured externally with O-Prolene suture and capped to allow internal drainage. The patient tolerated the procedure well.  No immediate complication.  IMPRESSION: 1.  Antegrade nephrostogram shows a short nondistended tortuous segment in the proximal ureter, and a long segment stricture in the distal ureter  extending over several centimeters just proximal to the UVJ. 2.  Technically successful 24 cm 8.5 French double-J ureteral stent placement. 3.  Exchange of percutaneous nephrostomy catheter, capped to allow internal drainage.   Original Report  Authenticated By: D. Wallace Going, MD   Dg Chest Port 1 View  06/09/2013   *RADIOLOGY REPORT*  Clinical Data: Cough, hypoxia  PORTABLE CHEST - 1 VIEW  Comparison: 06/07/2013  Findings: Cardiomediastinal silhouette is stable.  Central mild bronchitic changes.  Left IJ central line is stable in position. Right IJ catheter has been removed.  Persistent small left pleural effusion with left basilar atelectasis or infiltrate.  No pulmonary edema.  IMPRESSION: Central mild bronchitic changes.  Left IJ central line is stable in position.  Right IJ catheter has been removed.  Persistent small left pleural effusion with left basilar atelectasis or infiltrate. No pulmonary edema.   Original Report Authenticated By: Lahoma Crocker, M.D.   Ir Oris Drone Cath Perc Right  06/09/2013   *RADIOLOGY REPORT*  Clinical data:  Right ureteral stricture with development of urosepsis after inadvertent   ureteral stent dislodgement, now status post percutaneous nephrostomy drainage x 5 days, clinically improved.  ANTEGRADE NEPHROSTOGRAM URETERAL STENT PLACEMENT EXCHANGE OF RIGHT NEPHROSTOMY CATHETER UNDER FLUOROSCOPY  Technique and findings: The procedure, risks (including but not limited to bleeding, infection, organ damage), benefits, and alternatives were explained to the patient.  Questions regarding the procedure were encouraged and answered.  The patient understands and consents to the procedure.The patient placed prone. Right nephrostomy tube and surrounding skin prepped and draped usual sterile fashion. Maximal barrier sterile technique was utilized including caps, mask, sterile gowns, sterile gloves, sterile drape, hand hygiene and skin antiseptic.  The patient was receiving adequate antibiotic prophylactic coverage.  Intravenous Fentanyl and Versed were administered as conscious sedation during continuous cardiorespiratory monitoring by the radiology RN, with a total moderate sedation time of 22 minutes.  A small contrast  injection through the nephrostomy catheter was performed to opacify the renal collecting system.  The catheter was cut and exchanged over [facility] wire for a 5-French Kumpe catheter, advanced for antegrade nephrostogram.  The nephrostogram demonstrated moderate filling defects within the central right renal collecting system and proximal ureter consistent with retained clot.  There is some distention of the proximal ureter.  There is a short segment tortuous ureteral segment at approximately the L4 level but the catheter passed easily beyond the level as did the injected proximal contrast suggesting no   urodynamically significant stenosis here.  There is distention of the more distal ureter down to the level of the lower margin of the sacroiliac joint.  There is a long   tapered segment of the ureter extending several centimeters from this level to the level of the UVJ, with a small string of contrast seen with directed injection here.  The distal stricture was easily traversed and access to the lumen of the urinary bladder was achieved.  The Kumpe was exchanged for a 24 cm 8.5 French double-J ureteral stent, deployed over an Amplatz wire with the distal end in the urinary bladder, proximal end in the right renal collecting system.  Because of residual thrombus in the right renal collecting system, an new 10-French percutaneous nephrostomy pigtail catheter was advanced and positioned centrally in the right renal collecting system.  The nephrostomy catheter was secured externally with O-Prolene suture and capped to allow internal drainage. The patient tolerated the procedure well.  No immediate complication.  IMPRESSION: 1.  Antegrade  nephrostogram shows a short nondistended tortuous segment in the proximal ureter, and a long segment stricture in the distal ureter extending over several centimeters just proximal to the UVJ. 2.  Technically successful 24 cm 8.5 French double-J ureteral stent placement. 3.  Exchange of  percutaneous nephrostomy catheter, capped to allow internal drainage.   Original Report Authenticated By: D. Wallace Going, MD    Anti-infectives: Anti-infectives   Start     Dose/Rate Route Frequency Ordered Stop   06/09/13 1000  ciprofloxacin (CIPRO) IVPB 400 mg  Status:  Discontinued    Comments:  Hang ON CALL to xray today   400 mg 200 mL/hr over 60 Minutes Intravenous  Once 06/09/13 0953 06/09/13 1011   06/07/13 1400  cefTRIAXone (ROCEPHIN) 2 g in dextrose 5 % 50 mL IVPB     2 g 100 mL/hr over 30 Minutes Intravenous Every 24 hours 06/07/13 0929 06/14/13 2359   06/06/13 1800  piperacillin-tazobactam (ZOSYN) IVPB 3.375 g  Status:  Discontinued     3.375 g 12.5 mL/hr over 240 Minutes Intravenous 3 times per day 06/06/13 1430 06/07/13 0929   06/04/13 1500  piperacillin-tazobactam (ZOSYN) IVPB 3.375 g  Status:  Discontinued     3.375 g 100 mL/hr over 30 Minutes Intravenous Every 6 hours 06/04/13 1441 06/06/13 1430   06/04/13 1400  piperacillin-tazobactam (ZOSYN) IVPB 2.25 g  Status:  Discontinued     2.25 g 100 mL/hr over 30 Minutes Intravenous 3 times per day 06/04/13 1333 06/04/13 1441      Assessment/Plan: s/p * No surgery found *  Rt PCN placed 8/9 JJ and capped PCN 8/14 Doing well UOP less bloody Plan per Uro Remove PCN when UOP clear?  LOS: 7 days    Rayola Everhart A 06/11/2013

## 2013-06-11 NOTE — Progress Notes (Signed)
Patient ID: [name] [name], female   DOB: 06-23-1941, 72 y.o.   MRN: [id number]    Subjective: Pt feeling well after internalization of her right ureteral stent.  She denies right flank pain.  Objective: Vital signs in last 24 hours: Temp:  [98.3 F (36.8 C)-99.1 F (37.3 C)] 98.3 F (36.8 C) (08/16 0400) Pulse Rate:  [85-125] 85 (08/16 0645) Resp:  [15-22] 18 (08/16 0400) BP: (103-139)/(56-89) 122/72 mmHg (08/16 0645) SpO2:  [93 %-97 %] 97 % (08/16 0400) Weight:  [99.2 kg (218 lb 11.1 oz)] 99.2 kg (218 lb 11.1 oz) (08/16 0500)  Intake/Output from previous day: 08/15 0701 - 08/16 0700 In: 792.4 [P.O.:100; I.V.:692.4] Out: 2900 [Urine:2900] Intake/Output this shift:    Physical Exam:  General: Alert and oriented GU: Urine is grossly red tinged but draining well via catheter Back: PCN tube is capped  Lab Results:  Recent Labs  06/09/13 0500 06/10/13 0259 06/11/13 0600  HGB 9.8* 9.7* 9.2*  HCT 29.9* 29.6* 27.5*   BMET  Recent Labs  06/10/13 0259 06/11/13 0600  NA 139 139  K 3.2* 3.5  CL 103 104  CO2 28 26  GLUCOSE 84 134*  BUN 17 17  CREATININE 1.41* 1.28*  CALCIUM 8.5 8.4     Studies/Results:  Assessment/Plan: - Continue current therapy with stent and catheter.  She does not appear to have evidence of significant GU bleeding considering that Hgb is relatively stable. Her PCN can continue to be capped as she has no evidence to suggest obstruction following it being capped.  Dr. Gaynelle Arabian to further evaluate next week.   LOS: 7 days   [name] [name],LES 06/11/2013, 11:07 AM

## 2013-06-11 NOTE — Progress Notes (Addendum)
ANTICOAGULATION CONSULT NOTE - Follow Up Consult  Pharmacy Consult for Heparin/Coumadin Indication: NSTEMI and Afib  Allergies  Allergen Reactions  . Codeine Nausea Only    hallucinations  . Hydrocodone Nausea Only    hallucinations  . Macrodantin [Nitrofurantoin] Nausea And Vomiting    Patient Measurements: Height: 5\' 7"  (170.2 cm) Weight: 218 lb 11.1 oz (99.2 kg) IBW/kg (Calculated) : 61.6 Heparin Dosing Weight: 85 kg  Vital Signs: Temp: 98.3 F (36.8 C) (08/16 0400) Temp src: Oral (08/16 0400) BP: 122/72 mmHg (08/16 0645) Pulse Rate: 85 (08/16 0645)  Labs:  Recent Labs  06/09/13 0500 06/10/13 0259 06/10/13 1429 06/10/13 2017 06/11/13 0600  HGB 9.8* 9.7*  --   --  9.2*  HCT 29.9* 29.6*  --   --  27.5*  PLT 173 253  --   --  365  LABPROT 13.9 15.4*  --   --  20.0*  INR 1.09 1.25  --   --  1.76*  HEPARINUNFRC  --  <0.10* 0.42 0.40 0.75*  CREATININE 1.66* 1.41*  --   --   --     Estimated Creatinine Clearance: 44.3 ml/min (by C-G formula based on Cr of 1.41).   Medications:  . amiodarone (NEXTERONE PREMIX) 360 mg/200 mL dextrose 30 mg/hr (06/10/13 2055)  . heparin 2,100 Units/hr (06/11/13 [id number])    Assessment:  Patient continues on heparin and Coumadin for NSTEMI and Afib. Heparin level is now supratherapeutic at 0.75 on 2100 units/hr. INR is subtherapeutic at 1.76 but trending up quickly now. Spoke with RN who reports no problems with heparin infusing. She also states patient still has hematuria. Hb is low stable, platelets are wnl. Of note, patient is on amiodarone which is known to increase INR.  Goal of Therapy:  INR 2-3 Heparin level 0.3-0.7 units/ml  Monitor platelets by anticoagulation protocol: Yes   Plan:   -Decrease heparin drip to 1950 units/hr -8 hr heparin level -Coumadin 2.5 mg PO tonight -Daily heparin level, CBC, INR -Monitor for signs/symptoms of bleeding  [facility], Pharm.D., BCPS Clinical Pharmacist Pager:  [phone] 06/11/2013 8:15 AM

## 2013-06-11 NOTE — Progress Notes (Signed)
Primary cardiologist: Dr. Agustin Cree, [facility] in [place]  Subjective:   No chest pain or palpitations. Hand pain improved.   Objective:   Temp:  [98.3 F (36.8 C)-99.1 F (37.3 C)] 98.3 F (36.8 C) (08/16 0400) Pulse Rate:  [85-125] 85 (08/16 0645) Resp:  [15-22] 18 (08/16 0400) BP: (103-139)/(56-89) 122/72 mmHg (08/16 0645) SpO2:  [93 %-97 %] 97 % (08/16 0400) Weight:  [218 lb 11.1 oz (99.2 kg)] 218 lb 11.1 oz (99.2 kg) (08/16 0500) Last BM Date: 06/10/13  Filed Weights   06/09/13 0500 06/10/13 0500 06/11/13 0500  Weight: 229 lb 8 oz (104.1 kg) 225 lb 12 oz (102.4 kg) 218 lb 11.1 oz (99.2 kg)    Intake/Output Summary (Last 24 hours) at 06/11/13 1052 Last data filed at 06/11/13 0647  Gross per 24 hour  Intake  792.4 ml  Output   2900 ml  Net -2107.6 ml   Telemetry: Atrial fibrillation.  Exam:  General: No distress.  Lungs:  Nonlabored.  Cardiac: Irregularly irregular.  Extremities: No pitting.  Lab Results:  Basic Metabolic Panel:  Recent Labs Lab 06/09/13 0500 06/10/13 0259 06/11/13 0600  NA 138 139 139  K 3.4* 3.2* 3.5  CL 102 103 104  CO2 27 28 26   GLUCOSE 82 84 134*  BUN 16 17 17   CREATININE 1.66* 1.41* 1.28*  CALCIUM 8.3* 8.5 8.4  MG 1.8 1.7 1.9    Liver Function Tests:  Recent Labs Lab 06/04/13 1234  06/07/13 0400  06/09/13 0500 06/10/13 0259 06/11/13 0600  AST 74*  --  47*  --   --   --   --   ALT 30  --  23  --   --   --   --   ALKPHOS 63  --  65  --   --   --   --   BILITOT 0.6  --  0.3  --   --   --   --   PROT 5.8*  --  4.6*  --   --   --   --   ALBUMIN 2.4*  < > 1.7*  < > 1.8* 1.9* 2.0*  < > = values in this interval not displayed.  CBC:  Recent Labs Lab 06/09/13 0500 06/10/13 0259 06/11/13 0600  WBC 11.4* 11.1* 16.7*  HGB 9.8* 9.7* 9.2*  HCT 29.9* 29.6* 27.5*  MCV 80.2 79.6 77.7*  PLT 173 253 365    Cardiac Enzymes:  Recent Labs Lab 06/05/13 1555 06/05/13 2313 06/06/13 0400  TROPONINI 4.31*  4.14* 3.58*    BNP:  Recent Labs  06/04/13 1235  PROBNP 22349.0*    Coagulation:  Recent Labs Lab 06/09/13 0500 06/10/13 0259 06/11/13 0600  INR 1.09 1.25 1.76*    INR 1.7   Medications:   Scheduled Medications: . amitriptyline  25 mg Oral QHS  . carvedilol  3.125 mg Oral BID WC  . cefTRIAXone (ROCEPHIN)  IV  2 g Intravenous Q24H  . nystatin cream   Topical BID  . pantoprazole  40 mg Oral Q1200  . [START ON 06/12/2013] predniSONE  40 mg Oral Q breakfast  . warfarin  2.5 mg Oral ONCE-1800  . warfarin   Does not apply Once  . Warfarin - Pharmacist Dosing Inpatient   Does not apply q1800     Infusions: . amiodarone (NEXTERONE PREMIX) 360 mg/200 mL dextrose 30 mg/hr (06/10/13 2055)  . heparin 1,950 Units/hr (06/11/13 0730)     PRN Medications:  sodium chloride, metoprolol, ondansetron   Assessment:   1. Persistent atrial fibrillation, heart rate coming under better control. Patient on heparin and Coumadin, INR 1.7. Continues on amiodarone and low-dose beta blocker.  2. NSTEMI possibly type 2, being managed medically at this time, peak troponin I 5.  3. CAD with cardiomyopathy, LVEF 45-50%.   Plan/Discussion:    Will try and increase Coreg to 6.25 mg twice daily. Otherwise continue heparin, Coumadin, and intravenous amiodarone for now. May ultimately need cardioversion, although will clearly need to have stabilization of her other comorbid illnesses first. Managing ischemic heart disease medically at this time.   [name] [name], M.D., F.A.C.C.

## 2013-06-11 NOTE — Progress Notes (Addendum)
[facility] Progress Note [name] [name] TEAM 1 - Stepdown ICU Team   [name] [name] [facility] [id number] DOB: 12/25/1940 DOA: 06/04/2013 PCP: [name] Hipps, MD  Brief narrative: 72 yo female with hx HTN, mult kidney stones, frequent UTI presented 8/8 to [facility] with fevers, chills, AMS. On 8/7 she underwent cystoscopy and attempted ureteral stent placement for R ureteral stricture. Per daughter, the stent fell out prior to her d/c post procedure and pt/MD elected to leave it out, hoping that the "balloon" he used would help improve the stricture. She was d/c home 8/7 but developed worsening abd pain, n/v and was seen in urology office 8/8 am and had foley placed for urinary retention. She returned home and by that evening developed fevers, chills, nausea, AMS and was taken to [facility] where she was found to be in septic shock with acute renal failure. She required intubation for worsening resp distress and was tx to [facility].   Since admission this patient has been evaluated by urology who recommended placement of a percutaneous nephrostomy tube. This was accomplished in interventional radiology.  Because of her severe acute renal failure she did require short-term CVVHD. In the interim this patient has a history of paroxysmal atrial fibrillation developed atrial fibrillation with RVR as well as abnormal elevation in troponin. Cardiology was consulted. It was felt the patient likely had demand ischemia due to acute stress in recent septic shock. Currently IV amiodarone infusion is being utilized for the atrial fibrillation. Patient stabilized and pressor agents were discontinued. Patient required intubation for less than 72 hours. She stabilized enough to transfer out to the step down unit and team 1 assumed care of this patient 06/08/2013  Assessment/Plan:    Septic shock -resolved -etiology due to UTI/bacteremia -Procalcitonin was greater than 175 with a lactic acid of 4.7 at  presentation    E coli bacteremia/Pyelonephritis -onset after GU tract instrumentation (failed insertion of ureteral stent) -cont anbx's - narrowed to Rocephin 8/11- consider total duration of 10 days or at direction of urology since will undergo surgical procedure of GU tract on 8/14    Acute renal failure on chronic kidney disease -Peak creatinine 3.22 at presentation with an associated GFR of 13 -required short term CVVHD until 8/11 -currently with excellent UOP -Nephro following  Diarrhea - lomotil stat and follow - if no resolution, will need rectal tube as skin is too raw for pouch - c diff negative - beginning more solid food and so stools should thicken now    Acute respiratory failure with hypoxia -Primarily related to acute septic shock    Right hydronephrosis and stricture s/p percutaneous nephrostomy tube/hematuria -Has been evaluated by [name] with urology this admission- last note completed 06/05/2013. At that time the recommendation was to maintain the nephrostomy tube and not internalize the stent. He also endorsed she would probably need consideration of ureteral reimplantation if she has persistent obstruction on a follow up nephrostogram which needs to be completed once she recovers from the sepsis -Urology has seen pt 8/14- plan is for nephrostogram and likely internalization of JJ stent -Heparin dc'd this am pre procedure    Atrial fibrillation with rapid ventricular response -Cardiology following -cont Heparin and Coumadin per Cards -TSH normal    NSTEMI, initial episode of care -Peak troponin 5.06 this admission -Cards following-adding low dose BB (8/15) -no indications for IP ischemic work up unless develops CP since etiology suspected to be demand ischemia  Right upper extremity edema/significant right  hand and thumb pain -involves same arm as HD cath -Venous duplex negative for DVT -HD cath dc'd 8/13 with noted decrease in edema -(8/15) has now  developed subtle redness over right hand and thumb which is exquisitely tender to touch-diff includes gout vs early cellulitis - significantly improved after Solumedrol and therfore was gout-   -will give few more days of steroids.     Anemia, unspecified -baseline hgb unknown - was 12.7 at admit but this in setting of acute sepsis and DH -FOB  X 1 neg and anemia panel reveals AOCD with low iron levels   DVT prophylaxis: IV heparin and coumadin Code Status: FULL Family Communication: Patient and husband at bedside Disposition Plan: transfer to tele  Consultants: Urology Nephrology Cardiology Interventional Radiology  Procedures: L IJ CVL 8/9>>>  ETT 8/9>>>8/11  R IJ HD cath 8/9 >> 8/13  CULTURES:  Blood 8/9 Oval Linsey) >> GNR >> E coli  Urine 8/9>>> negative BCx2 8/9>>> NGTD R nephrostomy drainage 8/9 >> few e coli  Antibiotics: Linezolid x1 8/9  Imipenem x1 8/9  Zosyn 8/9 >>>8/12  8/11 ceftriaxone>>   HPI/Subjective: Right hand pain significantly improved. Rectal pouch removed last night due to leakage at 1PM. Has had multiple incontinent episodes since then- c diff negative- will give anti- motility agent today.    Objective: Blood pressure 122/72, pulse 85, temperature 98.3 F (36.8 C), temperature source Oral, resp. rate 18, height 5\' 7"  (1.702 m), weight 99.2 kg (218 lb 11.1 oz), SpO2 97.00%.  Intake/Output Summary (Last 24 hours) at 06/11/13 0936 Last data filed at 06/11/13 [id number]  Gross per 24 hour  Intake  792.4 ml  Output   2900 ml  Net -2107.6 ml    Exam: General: No acute respiratory distress Lungs: Clear to auscultation bilaterally without wheezes or crackles, RA Cardiovascular: Irregular rate and atrial fibrillation rhythm without murmur gallop or rub normal S1 and S2, 1+ bilateral peripheral edema - central lines in bilateral neck-strong radial pulse on right Abdomen: Nontender, nondistended, soft, bowel sounds positive, no rebound, no ascites, no  appreciable mass-rectal tube with green bilious drainage Genitourinary: nephrostomy tube to st drain/blood tinged dark urine Musculoskeletal: right arm and hand edema, erythema and warmth which was severe has resolved completely Ext: No significant cyanosis, clubbing of bilateral lower extremities  Neurological: Alert and oriented x 3, moves all extremities x 4 without focal neurological deficits, CN 2-12 intact Skin- severe red rash on perianal area and on both buttocks a litter lower than the  anus  Scheduled Meds:  Scheduled Meds: . amitriptyline  25 mg Oral QHS  . antiseptic oral rinse  15 mL Mouth Rinse QID  . carvedilol  3.125 mg Oral BID WC  . cefTRIAXone (ROCEPHIN)  IV  2 g Intravenous Q24H  . diphenoxylate-atropine  2 tablet Oral STAT  . pantoprazole  40 mg Oral Q1200  . warfarin  2.5 mg Oral ONCE-1800  . warfarin   Does not apply Once  . Warfarin - Pharmacist Dosing Inpatient   Does not apply q1800   Continuous Infusions: . amiodarone (NEXTERONE PREMIX) 360 mg/200 mL dextrose 30 mg/hr (06/10/13 2055)  . heparin 2,100 Units/hr (06/11/13 0647)    Data Reviewed: Basic Metabolic Panel:  Recent Labs Lab 06/06/13 0400 06/07/13 0400 06/07/13 1800 06/08/13 0625 06/09/13 0500 06/10/13 0259 06/11/13 0600  NA 138 136 137 138 138 139 139  K 3.5 3.3* 3.5 3.7 3.4* 3.2* 3.5  CL 101 99 100 101 102 103 104  CO2 30 30 31 26 27 28 26   GLUCOSE 129* 108* 112* 117* 82 84 134*  BUN 10 17 16 17 16 17 17   CREATININE 1.01 1.70* 1.75* 1.84* 1.66* 1.41* 1.28*  CALCIUM 7.2* 7.5* 7.3* 8.0* 8.3* 8.5 8.4  MG 2.2 2.3  --   --  1.8 1.7 1.9  PHOS 1.0* 1.9*  --  2.5 2.5 2.2* 2.8   Liver Function Tests:  Recent Labs Lab 06/04/13 1234  06/07/13 0400 06/08/13 0625 06/09/13 0500 06/10/13 0259 06/11/13 0600  AST 74*  --  47*  --   --   --   --   ALT 30  --  23  --   --   --   --   ALKPHOS 63  --  65  --   --   --   --   BILITOT 0.6  --  0.3  --   --   --   --   PROT 5.8*  --  4.6*  --    --   --   --   ALBUMIN 2.4*  < > 1.7* 1.9* 1.8* 1.9* 2.0*  < > = values in this interval not displayed. CBC:  Recent Labs Lab 06/07/13 0400 06/08/13 0625 06/09/13 0500 06/10/13 0259 06/11/13 0600  WBC 13.3* 11.4* 11.4* 11.1* 16.7*  NEUTROABS 10.2* 7.5 7.0 6.7 13.7*  HGB 8.7* 9.2* 9.8* 9.7* 9.2*  HCT 26.0* 28.1* 29.9* 29.6* 27.5*  MCV 79.8 79.8 80.2 79.6 77.7*  PLT 102* 121* 173 253 365   Cardiac Enzymes:  Recent Labs Lab 06/04/13 1835 06/05/13 0035 06/05/13 1555 06/05/13 2313 06/06/13 0400  TROPONINI 3.89* 5.06* 4.31* 4.14* 3.58*   BNP (last 3 results)  Recent Labs  06/04/13 1235  PROBNP 22349.0*   CBG:  Recent Labs Lab 06/10/13 0436 06/10/13 0747 06/10/13 1235 06/10/13 1614 06/11/13 0345  GLUCAP 93 81 114* 128* 148*    Recent Results (from the past 240 hour(s))  MRSA PCR SCREENING     Status: None   Collection Time    06/04/13 12:29 PM      Result Value Range Status   MRSA by PCR NEGATIVE  NEGATIVE Final   Comment:            The GeneXpert MRSA Assay (FDA     approved for NASAL specimens     only), is one component of a     comprehensive MRSA colonization     surveillance program. It is not     intended to diagnose MRSA     infection nor to guide or     monitor treatment for     MRSA infections.  URINE CULTURE     Status: None   Collection Time    06/04/13 12:50 PM      Result Value Range Status   Specimen Description URINE, CATHETERIZED   Final   Special Requests NONE   Final   Culture  Setup Time     Final   Value: 06/04/2013 12:50     Performed at [facility]     Final   Value: NO GROWTH     Performed at [facility]   Culture     Final   Value: NO GROWTH     Performed at [facility]   Report Status 06/05/2013 FINAL   Final  CULTURE, BLOOD (ROUTINE X 2)     Status: None   Collection Time  06/04/13  2:10 PM      Result Value Range Status   Specimen Description BLOOD RIGHT ARM   Final    Special Requests BOTTLES DRAWN AEROBIC ONLY 10CC   Final   Culture  Setup Time     Final   Value: 06/04/2013 16:44     Performed at [facility]   Culture     Final   Value: NO GROWTH 5 DAYS     Performed at [facility]   Report Status 06/10/2013 FINAL   Final  CULTURE, BLOOD (ROUTINE X 2)     Status: None   Collection Time    06/04/13  2:55 PM      Result Value Range Status   Specimen Description BLOOD RIGHT ARM   Final   Special Requests BOTTLES DRAWN AEROBIC AND ANAEROBIC 10CC   Final   Culture  Setup Time     Final   Value: 06/04/2013 16:45     Performed at [facility]   Culture     Final   Value: NO GROWTH 5 DAYS     Performed at [facility]   Report Status 06/10/2013 FINAL   Final  BODY FLUID CULTURE     Status: None   Collection Time    06/04/13  3:58 PM      Result Value Range Status   Specimen Description FLUID   Final   Special Requests NONE   Final   Gram Stain     Final   Value: WBC PRESENT,BOTH PMN AND MONONUCLEAR     FEW GRAM NEGATIVE RODS     Performed at [facility]   Culture     Final   Value: FEW ESCHERICHIA COLI     Performed at [facility]   Report Status 06/06/2013 FINAL   Final   Organism ID, Bacteria ESCHERICHIA COLI   Final  CLOSTRIDIUM DIFFICILE BY PCR     Status: None   Collection Time    06/09/13  9:45 AM      Result Value Range Status   C difficile by pcr NEGATIVE  NEGATIVE Final     Studies:  Recent x-ray studies have been reviewed in detail by the Attending Physician  Debbe Odea, MD  On-Call/Text Page:      [email or web address]      password [email or web address]  If 7PM-7AM, please contact night-coverage [email or web address] Password [facility] 06/11/2013, 9:36 AM   LOS: 7 days

## 2013-06-12 DIAGNOSIS — R197 Diarrhea, unspecified: Secondary | ICD-10-CM

## 2013-06-12 LAB — CBC WITH DIFFERENTIAL/PLATELET
Basophils Absolute: 0 10*3/uL (ref 0.0–0.1)
Basophils Relative: 0 % (ref 0–1)
Eosinophils Absolute: 0.1 10*3/uL (ref 0.0–0.7)
Eosinophils Relative: 1 % (ref 0–5)
HCT: 27.2 % — ABNORMAL LOW (ref 36.0–46.0)
Hemoglobin: 9.2 g/dL — ABNORMAL LOW (ref 12.0–15.0)
Lymphocytes Relative: 21 % (ref 12–46)
Lymphs Abs: 2.3 10*3/uL (ref 0.7–4.0)
MCH: 26.4 pg (ref 26.0–34.0)
MCHC: 33.8 g/dL (ref 30.0–36.0)
MCV: 78.2 fL (ref 78.0–100.0)
Monocytes Absolute: 1.5 10*3/uL — ABNORMAL HIGH (ref 0.1–1.0)
Monocytes Relative: 13 % — ABNORMAL HIGH (ref 3–12)
Neutro Abs: 7.2 10*3/uL (ref 1.7–7.7)
Neutrophils Relative %: 65 % (ref 43–77)
Platelets: 456 10*3/uL — ABNORMAL HIGH (ref 150–400)
RBC: 3.48 MIL/uL — ABNORMAL LOW (ref 3.87–5.11)
RDW: 15.6 % — ABNORMAL HIGH (ref 11.5–15.5)
WBC: 11.1 10*3/uL — ABNORMAL HIGH (ref 4.0–10.5)

## 2013-06-12 LAB — HEPARIN LEVEL (UNFRACTIONATED)
Heparin Unfractionated: 0.52 IU/mL (ref 0.30–0.70)
Heparin Unfractionated: 0.11 IU/mL — ABNORMAL LOW (ref 0.30–0.70)
Heparin Unfractionated: 0.37 IU/mL (ref 0.30–0.70)

## 2013-06-12 LAB — RENAL FUNCTION PANEL
Albumin: 2.1 g/dL — ABNORMAL LOW (ref 3.5–5.2)
BUN: 16 mg/dL (ref 6–23)
CO2: 27 mEq/L (ref 19–32)
Calcium: 8.3 mg/dL — ABNORMAL LOW (ref 8.4–10.5)
Chloride: 106 mEq/L (ref 96–112)
Creatinine, Ser: 1.23 mg/dL — ABNORMAL HIGH (ref 0.50–1.10)
GFR calc Af Amer: 50 mL/min — ABNORMAL LOW (ref >90)
GFR calc non Af Amer: 43 mL/min — ABNORMAL LOW (ref >90)
Glucose, Bld: 94 mg/dL (ref 70–99)
Phosphorus: 3.2 mg/dL (ref 2.3–4.6)
Potassium: 3.2 mEq/L — ABNORMAL LOW (ref 3.5–5.1)
Sodium: 143 mEq/L (ref 135–145)

## 2013-06-12 LAB — MAGNESIUM: Magnesium: 1.6 mg/dL (ref 1.5–2.5)

## 2013-06-12 LAB — PROTIME-INR
INR: 1.94 — ABNORMAL HIGH (ref 0.00–1.49)
Prothrombin Time: 21.6 seconds — ABNORMAL HIGH (ref 11.6–15.2)

## 2013-06-12 MED ORDER — WARFARIN SODIUM 3 MG PO TABS
3.0000 mg | ORAL_TABLET | Freq: Once | ORAL | Status: AC
  Administered 2013-06-12: 3 mg via ORAL
  Filled 2013-06-12 (×2): qty 1

## 2013-06-12 MED ORDER — WARFARIN VIDEO: Status: DC | PRN

## 2013-06-12 MED ORDER — POTASSIUM CHLORIDE CRYS ER 20 MEQ PO TBCR
40.0000 meq | EXTENDED_RELEASE_TABLET | Freq: Once | ORAL | Status: AC
  Administered 2013-06-12: 40 meq via ORAL
  Filled 2013-06-12: qty 2

## 2013-06-12 MED ORDER — LOPERAMIDE HCL 2 MG PO CAPS
2.0000 mg | ORAL_CAPSULE | Freq: Four times a day (QID) | ORAL | Status: DC | PRN
  Administered 2013-06-12 – 2013-06-13 (×2): 2 mg via ORAL
  Filled 2013-06-12 (×2): qty 1

## 2013-06-12 MED ORDER — SODIUM CHLORIDE 0.9 % IJ SOLN
10.0000 mL | INTRAMUSCULAR | Status: DC | PRN
  Administered 2013-06-13: 20 mL

## 2013-06-12 NOTE — Progress Notes (Signed)
ANTICOAGULATION CONSULT NOTE - Follow Up Consult  Pharmacy Consult for Heparin/Coumadin Indication: NSTEMI and Afib  Allergies  Allergen Reactions  . Codeine Nausea Only    hallucinations  . Hydrocodone Nausea Only    hallucinations  . Macrodantin [Nitrofurantoin] Nausea And Vomiting    Patient Measurements: Height: 5\' 7"  (170.2 cm) Weight: 218 lb 11.1 oz (99.2 kg) IBW/kg (Calculated) : 61.6 Heparin Dosing Weight: 85 kg  Vital Signs: Temp: 98.8 F (37.1 C) (08/17 0416) Temp src: Oral (08/17 0416) BP: 152/81 mmHg (08/17 0600) Pulse Rate: 90 (08/17 0700)  Labs:  Recent Labs  06/10/13 0259  06/11/13 0600  06/12/13 0032 06/12/13 0530 06/12/13 0653  HGB 9.7*  --  9.2*  --   --  9.2*  --   HCT 29.6*  --  27.5*  --   --  27.2*  --   PLT 253  --  365  --   --  456*  --   LABPROT 15.4*  --  20.0*  --   --  21.6*  --   INR 1.25  --  1.76*  --   --  1.94*  --   HEPARINUNFRC <0.10*  < > 0.75*  < > 0.52 0.11* 0.37  CREATININE 1.41*  --  1.28*  --   --  1.23*  --   < > = values in this interval not displayed.  Estimated Creatinine Clearance: 50.7 ml/min (by C-G formula based on Cr of 1.23).   Medications:  . amiodarone (NEXTERONE PREMIX) 360 mg/200 mL dextrose 30 mg/hr (06/12/13 0700)  . heparin 1,950 Units/hr (06/12/13 0700)    Assessment:  Patient continues on heparin and Coumadin for NSTEMI and Afib. Heparin level was subtherapeutic this am at 0.11 however on repeat check it is 0.37 and therapeutic on 1950 units/hr. INR is subtherapeutic at 1.94 but trending up and almost at goal. No significant GU bleeding per Urology note, Hb is low stable, platelets are wnl. Of note, patient is on amiodarone which is known to increase INR.  Goal of Therapy:  INR 2-3 Heparin level 0.3-0.7 units/ml  Monitor platelets by anticoagulation protocol: Yes   Plan:   -Continue heparin drip at 1950 units/hr -Coumadin 3 mg PO tonight -Daily heparin level, CBC, INR -Monitor for  signs/symptoms of bleeding  [facility], Pharm.D., BCPS Clinical Pharmacist Pager: [phone] 06/12/2013 8:23 AM

## 2013-06-12 NOTE — Progress Notes (Signed)
Patient ID: [name] [name], female   DOB: 1941/04/12, 72 y.o.   MRN: [id number]    Subjective: Pt denies flank pain.  Objective: Vital signs in last 24 hours: Temp:  [98 F (36.7 C)-99.1 F (37.3 C)] 98.8 F (37.1 C) (08/17 0416) Pulse Rate:  [57-115] 90 (08/17 0700) Resp:  [15-30] 24 (08/17 0700) BP: (124-153)/(57-81) 152/81 mmHg (08/17 0600) SpO2:  [91 %-98 %] 93 % (08/17 0700) Weight:  [99.2 kg (218 lb 11.1 oz)] 99.2 kg (218 lb 11.1 oz) (08/17 0547)  Intake/Output from previous day: 08/16 0701 - 08/17 0700 In: 2772.6 [P.O.:1320; I.V.:1402.6; IV Piggyback:50] Out: 2550 [Urine:2550] Intake/Output this shift: Total I/O In: -  Out: 1000 [Urine:1000]  Physical Exam:  General: Alert and oriented GU: Urine pink tinged and appears to be less bloody than yesterday.   Lab Results:  Recent Labs  06/10/13 0259 06/11/13 0600 06/12/13 0530  HGB 9.7* 9.2* 9.2*  HCT 29.6* 27.5* 27.2*   BMET  Recent Labs  06/11/13 0600 06/12/13 0530  NA 139 143  K 3.5 3.2*  CL 104 106  CO2 26 27  GLUCOSE 134* 94  BUN 17 16  CREATININE 1.28* 1.23*  CALCIUM 8.4 8.3*     Studies/Results: No results found.  Assessment/Plan: - Continue urethral catheter as patient is not ambulatory at this point.  If urine continues to clear, she may be able to have catheter removed in next day or two. Her Hgb is stable with no evidence to suggest acute GU bleeding.  Keep nephrostomy tube capped for now with further recommendations per Dr. Gaynelle Arabian next week.   LOS: 8 days   Khalise Billard,LES 06/12/2013, 10:25 AM

## 2013-06-12 NOTE — Progress Notes (Signed)
[facility] Progress Note [place] TEAM 1 - Stepdown ICU Team   [name] [name] [facility] [id number] DOB: [birth year] DOA: 06/04/2013 PCP: [name], MD  Brief narrative: 72 yo female with hx HTN, mult kidney stones, frequent UTI presented 8/8 to [facility] with fevers, chills, AMS. On 8/7 she underwent cystoscopy and attempted ureteral stent placement for R ureteral stricture. Per daughter, the stent fell out prior to her d/c post procedure and pt/MD elected to leave it out, hoping that the "balloon" he used would help improve the stricture. She was d/c home 8/7 but developed worsening abd pain, n/v and was seen in urology office 8/8 am and had foley placed for urinary retention. She returned home and by that evening developed fevers, chills, nausea, AMS and was taken to [facility] where she was found to be in septic shock with acute renal failure. She required intubation for worsening resp distress and was tx to [facility].   Since admission this patient has been evaluated by urology who recommended placement of a percutaneous nephrostomy tube. This was accomplished in interventional radiology.  Because of her severe acute renal failure she did require short-term CVVHD. In the interim this patient has a history of paroxysmal atrial fibrillation developed atrial fibrillation with RVR as well as abnormal elevation in troponin. Cardiology was consulted. It was felt the patient likely had demand ischemia due to acute stress in recent septic shock. Currently IV amiodarone infusion is being utilized for the atrial fibrillation. Patient stabilized and pressor agents were discontinued. Patient required intubation for less than 72 hours. She stabilized enough to transfer out to the step down unit and team 1 assumed care of this patient 06/08/2013    Assessment/Plan:    Septic shock -resolved -etiology due to UTI/bacteremia -Procalcitonin was greater than 175 with a lactic acid of 4.7  at presentation    E coli bacteremia/Pyelonephritis -onset after GU tract instrumentation (failed insertion of ureteral stent) -cont anbx's - Antibiotics started on 8/9-  narrowed to Rocephin 8/11-  - should continue for 2 wks total    Right hydronephrosis and stricture s/p percutaneous nephrostomy tube/hematuria -Urology following  -perc nephrostomy placed in IR on 8/9 -right  JJ stent  placed on 8/14 and nephrostomy capped -foley present - urine draining well (but blood tinged)    Acute renal failure on chronic kidney disease -Peak creatinine 3.22 at presentation with an associated GFR of 13 -required short term CVVHD until 8/11 -currently with excellent UOP    Acute respiratory failure with hypoxia -Primarily related to acute septic shock - required short term intubation  Diarrhea/ buttock rash - pt was incontinent and has a severe rash on buttocks - improved significantly after lomotil x 1 on 8/16  - c diff negative - no further incontinence - follow rash    Atrial fibrillation with rapid ventricular response -Cardiology following -cont Heparin and Coumadin per Cards -TSH normal    NSTEMI, initial episode of care -Peak troponin 5.06 this admission -Cards following-adding low dose BB (8/15) -no indications for ischemic work at this time since etiology suspected to be demand ischemia  Right upper extremity edema/significant right hand and thumb pain -involves same arm as HD cath- this was removed - swelling improved significantly but not resolved -Venous duplex negative for DVT -(8/15)developed subtle redness over right hand and thumb which is exquisitely tender to touch- - Swelling completely resolved after 1 dose of Solumedrol and therfore was gout-   -will give few more days  of steroids to ensure no recurrence.     Anemia, unspecified -baseline hgb unknown - was 12.7 at admit but this in setting of acute sepsis and DH -FOB  X 1 neg and anemia panel reveals AOCD  with low iron levels   DVT prophylaxis: IV heparin and coumadin Code Status: FULL Family Communication: Patient and husband at bedside Disposition Plan: transfer to tele  Consultants: Urology Nephrology Cardiology Interventional Radiology  Procedures: L IJ CVL 8/9>>>  ETT 8/9>>>8/11  R IJ HD cath 8/9 >> 8/13  CULTURES:  Blood 8/9 Oval [place]) >> GNR >> E coli  Urine 8/9>>> negative BCx2 8/9>>> NGTD R nephrostomy drainage 8/9 >> few e coli  Antibiotics: Linezolid x1 8/9  Imipenem x1 8/9  Zosyn 8/9 >>>8/12  8/11 ceftriaxone>>   HPI/Subjective: Diarrhea improved- no longer incontinent - eating well. Pt awaiting PT eval- has been getting to chair well.    Objective: Blood pressure 139/75, pulse 95, temperature 98.6 F (37 C), temperature source Oral, resp. rate 29, height 5\' 7"  (1.702 m), weight 99.2 kg (218 lb 11.1 oz), SpO2 97.00%.  Intake/Output Summary (Last 24 hours) at 06/12/13 1412 Last data filed at 06/12/13 1400  Gross per 24 hour  Intake 2955.98 ml  Output   3775 ml  Net -819.02 ml    Exam: General: No acute respiratory distress Lungs: Clear to auscultation bilaterally without wheezes or crackles, RA Cardiovascular: Irregular rate and atrial fibrillation rhythm without murmur gallop or rub normal S1 and S2, 1+ bilateral peripheral edema - central lines in bilateral neck-strong radial pulse on right Abdomen: Nontender, nondistended, soft, bowel sounds positive, no rebound, no ascites, no appreciable mass-rectal tube with green bilious drainage Genitourinary: foley with blood tinged urine Musculoskeletal: right arm and hand edema, erythema and warmth which was severe has resolved completely Ext: No significant cyanosis, clubbing of bilateral lower extremities  Neurological: Alert and oriented x 3, moves all extremities x 4 without focal neurological deficits, CN 2-12 intact Skin- severe red rash on perianal area and on both buttocks a litter lower than the   anus  Scheduled Meds:  Scheduled Meds: . amitriptyline  25 mg Oral QHS  . carvedilol  3.125 mg Oral BID WC  . cefTRIAXone (ROCEPHIN)  IV  2 g Intravenous Q24H  . nystatin cream   Topical BID  . pantoprazole  40 mg Oral Q1200  . predniSONE  40 mg Oral Q breakfast  . warfarin  3 mg Oral ONCE-1800  . Warfarin - Pharmacist Dosing Inpatient   Does not apply q1800   Continuous Infusions: . amiodarone (NEXTERONE PREMIX) 360 mg/200 mL dextrose 30.06 mg/hr (06/12/13 1400)  . heparin 1,950 Units/hr (06/12/13 1400)    Data Reviewed: Basic Metabolic Panel:  Recent Labs Lab 06/07/13 0400  06/08/13 0625 06/09/13 0500 06/10/13 0259 06/11/13 0600 06/12/13 0530  NA 136  < > 138 138 139 139 143  K 3.3*  < > 3.7 3.4* 3.2* 3.5 3.2*  CL 99  < > 101 102 103 104 106  CO2 30  < > 26 27 28 26 27   GLUCOSE 108*  < > 117* 82 84 134* 94  BUN 17  < > 17 16 17 17 16   CREATININE 1.70*  < > 1.84* 1.66* 1.41* 1.28* 1.23*  CALCIUM 7.5*  < > 8.0* 8.3* 8.5 8.4 8.3*  MG 2.3  --   --  1.8 1.7 1.9 1.6  PHOS 1.9*  --  2.5 2.5 2.2* 2.8 3.2  < > =  values in this interval not displayed. Liver Function Tests:  Recent Labs Lab 06/07/13 0400 06/08/13 0625 06/09/13 0500 06/10/13 0259 06/11/13 0600 06/12/13 0530  AST 47*  --   --   --   --   --   ALT 23  --   --   --   --   --   ALKPHOS 65  --   --   --   --   --   BILITOT 0.3  --   --   --   --   --   PROT 4.6*  --   --   --   --   --   ALBUMIN 1.7* 1.9* 1.8* 1.9* 2.0* 2.1*   CBC:  Recent Labs Lab 06/08/13 0625 06/09/13 0500 06/10/13 0259 06/11/13 0600 06/12/13 0530  WBC 11.4* 11.4* 11.1* 16.7* 11.1*  NEUTROABS 7.5 7.0 6.7 13.7* 7.2  HGB 9.2* 9.8* 9.7* 9.2* 9.2*  HCT 28.1* 29.9* 29.6* 27.5* 27.2*  MCV 79.8 80.2 79.6 77.7* 78.2  PLT 121* 173 253 365 456*   Cardiac Enzymes:  Recent Labs Lab 06/05/13 1555 06/05/13 2313 06/06/13 0400  TROPONINI 4.31* 4.14* 3.58*   BNP (last 3 results)  Recent Labs  06/04/13 1235  PROBNP 22349.0*    CBG:  Recent Labs Lab 06/10/13 0436 06/10/13 0747 06/10/13 1235 06/10/13 1614 06/11/13 0345  GLUCAP 93 81 114* 128* 148*    Recent Results (from the past 240 hour(s))  MRSA PCR SCREENING     Status: None   Collection Time    06/04/13 12:29 PM      Result Value Range Status   MRSA by PCR NEGATIVE  NEGATIVE Final   Comment:            The GeneXpert MRSA Assay (FDA     approved for NASAL specimens     only), is one component of a     comprehensive MRSA colonization     surveillance program. It is not     intended to diagnose MRSA     infection nor to guide or     monitor treatment for     MRSA infections.  URINE CULTURE     Status: None   Collection Time    06/04/13 12:50 PM      Result Value Range Status   Specimen Description URINE, CATHETERIZED   Final   Special Requests NONE   Final   Culture  Setup Time     Final   Value: 06/04/2013 12:50     Performed at [facility]     Final   Value: NO GROWTH     Performed at [facility]   Culture     Final   Value: NO GROWTH     Performed at [facility]   Report Status 06/05/2013 FINAL   Final  CULTURE, BLOOD (ROUTINE X 2)     Status: None   Collection Time    06/04/13  2:10 PM      Result Value Range Status   Specimen Description BLOOD RIGHT ARM   Final   Special Requests BOTTLES DRAWN AEROBIC ONLY 10CC   Final   Culture  Setup Time     Final   Value: 06/04/2013 16:44     Performed at [facility]   Culture     Final   Value: NO GROWTH 5 DAYS     Performed at [facility]  Report Status 06/10/2013 FINAL   Final  CULTURE, BLOOD (ROUTINE X 2)     Status: None   Collection Time    06/04/13  2:55 PM      Result Value Range Status   Specimen Description BLOOD RIGHT ARM   Final   Special Requests BOTTLES DRAWN AEROBIC AND ANAEROBIC 10CC   Final   Culture  Setup Time     Final   Value: 06/04/2013 16:45     Performed at [facility]   Culture      Final   Value: NO GROWTH 5 DAYS     Performed at [facility]   Report Status 06/10/2013 FINAL   Final  BODY FLUID CULTURE     Status: None   Collection Time    06/04/13  3:58 PM      Result Value Range Status   Specimen Description FLUID   Final   Special Requests NONE   Final   Gram Stain     Final   Value: WBC PRESENT,BOTH PMN AND MONONUCLEAR     FEW GRAM NEGATIVE RODS     Performed at [facility]   Culture     Final   Value: FEW ESCHERICHIA COLI     Performed at [facility]   Report Status 06/06/2013 FINAL   Final   Organism ID, Bacteria ESCHERICHIA COLI   Final  CLOSTRIDIUM DIFFICILE BY PCR     Status: None   Collection Time    06/09/13  9:45 AM      Result Value Range Status   C difficile by pcr NEGATIVE  NEGATIVE Final     Studies:  Recent x-ray studies have been reviewed in detail by the Attending Physician  Debbe Odea, MD  On-Call/Text Page:      [email or web address]      password [email or web address]  If 7PM-7AM, please contact night-coverage [email or web address] Password [email or web address] 06/12/2013, 2:12 PM   LOS: 8 days

## 2013-06-12 NOTE — Progress Notes (Signed)
   Primary cardiologist: Dr. Agustin Cree, [facility] in [place]  Subjective:   No palpitations or chest pain.   Objective:   Temp:  [98 F (36.7 C)-99.1 F (37.3 C)] 98.8 F (37.1 C) (08/17 0416) Pulse Rate:  [57-115] 90 (08/17 0700) Resp:  [15-30] 24 (08/17 0700) BP: (124-153)/(57-81) 152/81 mmHg (08/17 0600) SpO2:  [91 %-98 %] 93 % (08/17 0700) Weight:  [218 lb 11.1 oz (99.2 kg)] 218 lb 11.1 oz (99.2 kg) (08/17 0547) Last BM Date: 06/11/13  Filed Weights   06/10/13 0500 06/11/13 0500 06/12/13 0547  Weight: 225 lb 12 oz (102.4 kg) 218 lb 11.1 oz (99.2 kg) 218 lb 11.1 oz (99.2 kg)    Intake/Output Summary (Last 24 hours) at 06/12/13 1048 Last data filed at 06/12/13 0800  Gross per 24 hour  Intake 2412.58 ml  Output   3550 ml  Net -1137.42 ml   Telemetry: Atrial fibrillation.  Exam:  General: No distress.  Lungs:  Nonlabored.  Cardiac: Irregularly irregular.  Extremities: No pitting.  Lab Results:  Basic Metabolic Panel:  Recent Labs Lab 06/10/13 0259 06/11/13 0600 06/12/13 0530  NA 139 139 143  K 3.2* 3.5 3.2*  CL 103 104 106  CO2 28 26 27   GLUCOSE 84 134* 94  BUN 17 17 16   CREATININE 1.41* 1.28* 1.23*  CALCIUM 8.5 8.4 8.3*  MG 1.7 1.9 1.6    CBC:  Recent Labs Lab 06/10/13 0259 06/11/13 0600 06/12/13 0530  WBC 11.1* 16.7* 11.1*  HGB 9.7* 9.2* 9.2*  HCT 29.6* 27.5* 27.2*  MCV 79.6 77.7* 78.2  PLT 253 365 456*    Cardiac Enzymes:  Recent Labs Lab 06/05/13 1555 06/05/13 2313 06/06/13 0400  TROPONINI 4.31* 4.14* 3.58*    Coagulation:  Recent Labs Lab 06/10/13 0259 06/11/13 0600 06/12/13 0530  INR 1.25 1.76* 1.94*     Medications:   Scheduled Medications: . amitriptyline  25 mg Oral QHS  . carvedilol  3.125 mg Oral BID WC  . cefTRIAXone (ROCEPHIN)  IV  2 g Intravenous Q24H  . nystatin cream   Topical BID  . pantoprazole  40 mg Oral Q1200  . predniSONE  40 mg Oral Q breakfast  . warfarin  3 mg Oral ONCE-1800    . warfarin   Does not apply Once  . Warfarin - Pharmacist Dosing Inpatient   Does not apply q1800    Infusions: . amiodarone (NEXTERONE PREMIX) 360 mg/200 mL dextrose 30 mg/hr (06/12/13 0700)  . heparin 1,950 Units/hr (06/12/13 0700)    PRN Medications: sodium chloride, metoprolol, ondansetron, warfarin   Assessment:   1. Persistent atrial fibrillation, heart rate better controlled. Patient on heparin and Coumadin, INR up to 1.9. Continues on amiodarone and beta blocker.  2. NSTEMI possibly type 2, being managed medically at this time, peak troponin I 5.  3. CAD with cardiomyopathy, LVEF 45-50%.   Plan/Discussion:    She tolerated increase Coreg to 6.25 mg twice daily. Continue heparin, Coumadin, and intravenous amiodarone for now. May ultimately need cardioversion, although will clearly need to have stabilization of her other comorbid illnesses first. Managing ischemic heart disease medically at this time.   Satira Sark, M.D., F.A.C.C.

## 2013-06-13 LAB — HEPARIN INDUCED THROMBOCYTOPENIA PNL
Heparin Induced Plt Ab: NEGATIVE
Patient O.D.: 0.093
UFH High Dose UFH H: 6 % Release
UFH Low Dose 0.1 IU/mL: 3 % Release
UFH Low Dose 0.5 IU/mL: 3 % Release
UFH SRA Result: NEGATIVE

## 2013-06-13 LAB — CBC WITH DIFFERENTIAL/PLATELET
Basophils Absolute: 0 10*3/uL (ref 0.0–0.1)
Basophils Relative: 0 % (ref 0–1)
Eosinophils Absolute: 0.1 10*3/uL (ref 0.0–0.7)
Eosinophils Relative: 1 % (ref 0–5)
HCT: 27.1 % — ABNORMAL LOW (ref 36.0–46.0)
Hemoglobin: 8.9 g/dL — ABNORMAL LOW (ref 12.0–15.0)
Lymphocytes Relative: 26 % (ref 12–46)
Lymphs Abs: 2.8 10*3/uL (ref 0.7–4.0)
MCH: 26.3 pg (ref 26.0–34.0)
MCHC: 32.8 g/dL (ref 30.0–36.0)
MCV: 79.9 fL (ref 78.0–100.0)
Monocytes Absolute: 1.3 10*3/uL — ABNORMAL HIGH (ref 0.1–1.0)
Monocytes Relative: 12 % (ref 3–12)
Neutro Abs: 6.6 10*3/uL (ref 1.7–7.7)
Neutrophils Relative %: 61 % (ref 43–77)
Platelets: 465 10*3/uL — ABNORMAL HIGH (ref 150–400)
RBC: 3.39 MIL/uL — ABNORMAL LOW (ref 3.87–5.11)
RDW: 16 % — ABNORMAL HIGH (ref 11.5–15.5)
WBC: 10.8 10*3/uL — ABNORMAL HIGH (ref 4.0–10.5)

## 2013-06-13 LAB — RENAL FUNCTION PANEL
Albumin: 2.1 g/dL — ABNORMAL LOW (ref 3.5–5.2)
BUN: 15 mg/dL (ref 6–23)
CO2: 28 mEq/L (ref 19–32)
Calcium: 8.3 mg/dL — ABNORMAL LOW (ref 8.4–10.5)
Chloride: 106 mEq/L (ref 96–112)
Creatinine, Ser: 1.07 mg/dL (ref 0.50–1.10)
GFR calc Af Amer: 59 mL/min — ABNORMAL LOW (ref >90)
GFR calc non Af Amer: 51 mL/min — ABNORMAL LOW (ref >90)
Glucose, Bld: 92 mg/dL (ref 70–99)
Phosphorus: 3 mg/dL (ref 2.3–4.6)
Potassium: 3.6 mEq/L (ref 3.5–5.1)
Sodium: 141 mEq/L (ref 135–145)

## 2013-06-13 LAB — HEPARIN LEVEL (UNFRACTIONATED): Heparin Unfractionated: 0.54 IU/mL (ref 0.30–0.70)

## 2013-06-13 LAB — PROTIME-INR
INR: 2.39 — ABNORMAL HIGH (ref 0.00–1.49)
Prothrombin Time: 25.3 seconds — ABNORMAL HIGH (ref 11.6–15.2)

## 2013-06-13 LAB — MAGNESIUM: Magnesium: 1.6 mg/dL (ref 1.5–2.5)

## 2013-06-13 MED ORDER — WARFARIN SODIUM 2.5 MG PO TABS
2.5000 mg | ORAL_TABLET | Freq: Once | ORAL | Status: AC
  Administered 2013-06-13: 2.5 mg via ORAL
  Filled 2013-06-13: qty 1

## 2013-06-13 MED ORDER — ACETAMINOPHEN 325 MG PO TABS: 650.0000 mg | ORAL_TABLET | ORAL | Status: DC | PRN

## 2013-06-13 MED ORDER — AMIODARONE HCL 200 MG PO TABS
400.0000 mg | ORAL_TABLET | Freq: Two times a day (BID) | ORAL | Status: DC
  Administered 2013-06-13 – 2013-06-15 (×6): 400 mg via ORAL
  Filled 2013-06-13 (×8): qty 2

## 2013-06-13 NOTE — Progress Notes (Signed)
Physical Therapy Treatment Patient Details Name: [name] [name] MRN: [id number] DOB: [birth year] Today's Date: 06/13/2013 Time: [id number] PT Time Calculation (min): 18 min  PT Assessment / Plan / Recommendation  History of Present Illness 72 yo female with hx kidney stones, frequent UTI, HTN tx 8/9 from [facility] with septic shock, acute renal failure and R pyelonephritis after failed ureteral stent placement 8/7.  S/p R perc nephrostomy 8/9. CVVHD.    PT Comments   The pt is progressing well with less assist needed for gait and mobility.  She continues to be appropriate for [facility] therapies at discharge.    Follow Up Recommendations  Home health PT;Supervision for mobility/OOB     Does the patient have the potential to tolerate intense rehabilitation    NA  Barriers to Discharge   None      Equipment Recommendations  None recommended by PT    Recommendations for Other Services   None  Frequency Min 3X/week   Progress towards PT Goals Progress towards PT goals: Progressing toward goals  Plan Current plan remains appropriate    Precautions / Restrictions Precautions Precautions: Fall Precaution Comments: due to generalized weakness   Pertinent Vitals/Pain See vitals flow sheet.     Mobility  Bed Mobility Sit to Supine: 6: Modified independent (Device/Increase time);With rail;HOB flat Details for Bed Mobility Assistance: used rail for leverage when getting back into bed.   Transfers Sit to Stand: 5: Supervision;With upper extremity assist;With armrests;From chair/3-in-1 Stand to Sit: 5: Supervision;With upper extremity assist;To bed Details for Transfer Assistance: supervision for safety Ambulation/Gait Ambulation/Gait Assistance: 4: Min guard Ambulation Distance (Feet): 180 Feet Assistive device: Rolling walker Ambulation/Gait Assistance Details: min guard assist for safety as pt with visible upper and lower extremity fatigue during gait.  Gait Pattern:  Step-through pattern;Trunk flexed    Exercises General Exercises - Upper Extremity Shoulder Flexion: AROM;10 reps;Seated Elbow Flexion: AROM;Both;10 reps;Seated General Exercises - Lower Extremity Long Arc Quad: AROM;Both;10 reps;Seated Hip Flexion/Marching: AROM;Both;10 reps;Seated Toe Raises: AROM;Both;10 reps;Seated Heel Raises: AROM;Both;10 reps;Seated     PT Goals (current goals can now be found in the care plan section) Acute Rehab PT Goals Patient Stated Goal: To go home  Visit Information  Last PT Received On: 06/13/13 Assistance Needed: +1 History of Present Illness: 72 yo female with hx kidney stones, frequent UTI, HTN tx 8/9 from [facility] with septic shock, acute renal failure and R pyelonephritis after failed ureteral stent placement 8/7.  S/p R perc nephrostomy 8/9. CVVHD.     Subjective Data  Subjective: Pt reports she talked to husband (who wanted her to go to CIR) and she says she has convinced him to take her home (which PT believes is the most appropriate).   Patient Stated Goal: To go home   Cognition  Cognition Arousal/Alertness: Awake/alert Behavior During Therapy: WFL for tasks assessed/performed Overall Cognitive Status: Within Functional Limits for tasks assessed       End of Session PT - End of Session Activity Tolerance: Patient limited by fatigue Patient left: in bed;with call bell/phone within reach    Martinez B. [place], [place], DPT [phone]   06/13/2013, 12:12 PM

## 2013-06-13 NOTE — Progress Notes (Signed)
PATIENT DETAILS Name: [name] [name] [facility] Age: 72 y.o. Sex: female Date of Birth: [birth year] Admit Date: 06/04/2013 Admitting Physician Collene Gobble, MD GT:[id number] S, MD  Subjective: Small right groin hematoma-but otherwise no major complaints  Brief narrative:  72 yo female with hx HTN, mult kidney stones, frequent UTI presented 8/8 to [facility] with fevers, chills, AMS. On 8/7 she underwent cystoscopy and attempted ureteral stent placement for R ureteral stricture. Per daughter, the stent fell out prior to her d/c post procedure and pt/MD elected to leave it out, hoping that the "balloon" he used would help improve the stricture. She was d/c home 8/7 but developed worsening abd pain, n/v and was seen in urology office 8/8 am and had foley placed for urinary retention. She returned home and by that evening developed fevers, chills, nausea, AMS and was taken to [facility] where she was found to be in septic shock with acute renal failure. She required intubation for worsening resp distress and was tx to [facility].  Since admission this patient has been evaluated by urology who recommended placement of a percutaneous nephrostomy tube. This was accomplished in interventional radiology. Because of her severe acute renal failure she did require short-term CVVHD. In the interim this patient has a history of paroxysmal atrial fibrillation developed atrial fibrillation with RVR as well as abnormal elevation in troponin. Cardiology was consulted. It was felt the patient likely had demand ischemia due to acute stress in recent septic shock. Currently IV amiodarone infusion is being utilized for the atrial fibrillation. Patient stabilized and pressor agents were discontinued. Patient required intubation for less than 72 hours. She stabilized enough to transfer out to the step down unit and team 1 assumed care of this patient 06/08/2013, and was transferred to Telemetry on  8/18.  Assessment/Plan:  Septic shock  -resolved  -etiology due to UTI/bacteremia  -Procalcitonin was greater than 175 with a lactic acid of 4.7 at presentation   E coli bacteremia/Pyelonephritis  -onset after GU tract instrumentation (failed insertion of ureteral stent)  -cont anbx's - Antibiotics started on 8/9- narrowed to Rocephin 8/11-  - should continue for 2 wks total-stop date-8/23  Right hydronephrosis and stricture s/p percutaneous nephrostomy tube/hematuria  -Urology following  -perc nephrostomy placed in IR on 8/9 -removed by IR Per Dr. Gaynelle Arabian request on 8/18 -right JJ stent placed on 8/14  -foley present - urine draining well (but blood tinged)-will need to d/w urology if we can remove this as well.  Acute renal failure on chronic kidney disease  -Peak creatinine 3.22 at presentation with an associated GFR of 13  -required short term CVVHD until 8/11  -currently with excellent UOP, thankfully renal function has normalized  Acute respiratory failure with hypoxia  -Primarily related to acute septic shock  - required short term intubation   Diarrhea/ buttock rash  - pt was incontinent and has a severe rash on buttocks  - improved significantly after lomotil x 1 on 8/16  - c diff negative  - no further incontinence  - follow rash   Atrial fibrillation with rapid ventricular response  -Cardiology following  -was started Heparin and Coumadin, since INR now therapeutice Heparin -on Amiodarone and Coreg for rate control -TSH normal   NSTEMI, initial episode of care  -Peak troponin 5.06 this admission  -Cards following-adding low dose BB (8/15)  -no indications for ischemic work at this time since etiology suspected to be demand ischemia   Right upper extremity edema/significant right hand  and thumb pain  -involves same arm as HD cath- this was removed - swelling improved significantly but not resolved  -Venous duplex negative for DVT  -(8/15)developed subtle  redness over right hand and thumb which is exquisitely tender to touch-  - Swelling completely resolved after 1 dose of Solumedrol and therfore was gout-will give few more days of steroids to ensure no recurrence.   Anemia, unspecified  -baseline hgb unknown - was 12.7 at admit but this in setting of acute sepsis and DH  -FOB X 1 neg and anemia panel reveals AOCD with low iron levels  Small Right Groin Hematoma -monitor-for now-she is on anticoagulation  Disposition: Remain inpatient  DVT Prophylaxis: Not needed as she is on coumadin  Code Status: Full code   Family Communication Husband at bedside  Procedures: L IJ CVL 8/9>>>  ETT 8/9>>>8/11  R IJ HD cath 8/9 >> 8/13  CONSULTS:  cardiology and pulmonary/intensive care   MEDICATIONS: Scheduled Meds: . amiodarone  400 mg Oral BID  . amitriptyline  25 mg Oral QHS  . carvedilol  3.125 mg Oral BID WC  . cefTRIAXone (ROCEPHIN)  IV  2 g Intravenous Q24H  . nystatin cream   Topical BID  . pantoprazole  40 mg Oral Q1200  . predniSONE  40 mg Oral Q breakfast  . warfarin  2.5 mg Oral ONCE-1800  . Warfarin - Pharmacist Dosing Inpatient   Does not apply q1800   Continuous Infusions:  PRN Meds:.sodium chloride, loperamide, metoprolol, ondansetron, sodium chloride, warfarin  Antibiotics: Anti-infectives   Start     Dose/Rate Route Frequency Ordered Stop   06/09/13 1000  ciprofloxacin (CIPRO) IVPB 400 mg  Status:  Discontinued    Comments:  Hang ON CALL to xray today   400 mg 200 mL/hr over 60 Minutes Intravenous  Once 06/09/13 0953 06/09/13 1011   06/07/13 1400  cefTRIAXone (ROCEPHIN) 2 g in dextrose 5 % 50 mL IVPB     2 g 100 mL/hr over 30 Minutes Intravenous Every 24 hours 06/07/13 0929 06/14/13 2359   06/06/13 1800  piperacillin-tazobactam (ZOSYN) IVPB 3.375 g  Status:  Discontinued     3.375 g 12.5 mL/hr over 240 Minutes Intravenous 3 times per day 06/06/13 1430 06/07/13 0929   06/04/13 1500  piperacillin-tazobactam  (ZOSYN) IVPB 3.375 g  Status:  Discontinued     3.375 g 100 mL/hr over 30 Minutes Intravenous Every 6 hours 06/04/13 1441 06/06/13 1430   06/04/13 1400  piperacillin-tazobactam (ZOSYN) IVPB 2.25 g  Status:  Discontinued     2.25 g 100 mL/hr over 30 Minutes Intravenous 3 times per day 06/04/13 1333 06/04/13 1441       PHYSICAL EXAM: Vital signs in last 24 hours: Filed Vitals:   06/12/13 2100 06/13/13 0602 06/13/13 0729 06/13/13 1126  BP: 137/70 145/61 147/70 148/73  Pulse: 109 91 85 93  Temp: 98.6 F (37 C) 98.3 F (36.8 C) 98.9 F (37.2 C) 99.6 F (37.6 C)  TempSrc:   Oral Oral  Resp: 20 18 18 18   Height:      Weight:  95.936 kg (211 lb 8 oz)    SpO2: 93% 95% 97% 95%    Weight change: -3.264 kg (-7 lb 3.1 oz) Filed Weights   06/11/13 0500 06/12/13 0547 06/13/13 0602  Weight: 99.2 kg (218 lb 11.1 oz) 99.2 kg (218 lb 11.1 oz) 95.936 kg (211 lb 8 oz)   Body mass index is 33.12 kg/(m^2).   Gen Exam: Awake  and alert with clear speech.   Neck: Supple, No JVD.   Chest: B/L Clear.   CVS: S1 S2 Regular, no murmurs.  Abdomen: soft, BS +, non tender, non distended.  Extremities: no edema, lower extremities warm to touch. Neurologic: Non Focal.   Skin: No Rash.   Wounds: N/A.    Intake/Output from previous day:  Intake/Output Summary (Last 24 hours) at 06/13/13 1439 Last data filed at 06/13/13 1259  Gross per 24 hour  Intake 1791.02 ml  Output   2750 ml  Net -958.98 ml     LAB RESULTS: CBC  Recent Labs Lab 06/09/13 0500 06/10/13 0259 06/11/13 0600 06/12/13 0530 06/13/13 0622  WBC 11.4* 11.1* 16.7* 11.1* 10.8*  HGB 9.8* 9.7* 9.2* 9.2* 8.9*  HCT 29.9* 29.6* 27.5* 27.2* 27.1*  PLT 173 253 365 456* 465*  MCV 80.2 79.6 77.7* 78.2 79.9  MCH 26.3 26.1 26.0 26.4 26.3  MCHC 32.8 32.8 33.5 33.8 32.8  RDW 16.0* 15.5 15.3 15.6* 16.0*  LYMPHSABS 1.8 2.1 1.5 2.3 2.8  MONOABS 1.9* 1.6* 1.5* 1.5* 1.3*  EOSABS 0.7 0.6 0.0 0.1 0.1  BASOSABS 0.0 0.0 0.0 0.0 0.0     Chemistries   Recent Labs Lab 06/09/13 0500 06/10/13 0259 06/11/13 0600 06/12/13 0530 06/13/13 0622  NA 138 139 139 143 141  K 3.4* 3.2* 3.5 3.2* 3.6  CL 102 103 104 106 106  CO2 27 28 26 27 28   GLUCOSE 82 84 134* 94 92  BUN 16 17 17 16 15   CREATININE 1.66* 1.41* 1.28* 1.23* 1.07  CALCIUM 8.3* 8.5 8.4 8.3* 8.3*  MG 1.8 1.7 1.9 1.6 1.6    CBG:  Recent Labs Lab 06/10/13 0436 06/10/13 0747 06/10/13 1235 06/10/13 1614 06/11/13 0345  GLUCAP 93 81 114* 128* 148*    GFR Estimated Creatinine Clearance: 57.3 ml/min (by C-G formula based on Cr of 1.07).  Coagulation profile  Recent Labs Lab 06/09/13 0500 06/10/13 0259 06/11/13 0600 06/12/13 0530 06/13/13 0622  INR 1.09 1.25 1.76* 1.94* 2.39*    Cardiac Enzymes No results found for this basename: CK, CKMB, TROPONINI, MYOGLOBIN,  in the last 168 hours  No components found with this basename: POCBNP,  No results found for this basename: DDIMER,  in the last 72 hours No results found for this basename: HGBA1C,  in the last 72 hours No results found for this basename: CHOL, HDL, LDLCALC, TRIG, CHOLHDL, LDLDIRECT,  in the last 72 hours No results found for this basename: TSH, T4TOTAL, FREET3, T3FREE, THYROIDAB,  in the last 72 hours No results found for this basename: VITAMINB12, FOLATE, FERRITIN, TIBC, IRON, RETICCTPCT,  in the last 72 hours No results found for this basename: LIPASE, AMYLASE,  in the last 72 hours  Urine Studies No results found for this basename: UACOL, UAPR, USPG, UPH, UTP, UGL, UKET, UBIL, UHGB, UNIT, UROB, ULEU, UEPI, UWBC, URBC, UBAC, CAST, CRYS, UCOM, BILUA,  in the last 72 hours  MICROBIOLOGY: Recent Results (from the past 240 hour(s))  MRSA PCR SCREENING     Status: None   Collection Time    06/04/13 12:29 PM      Result Value Range Status   MRSA by PCR NEGATIVE  NEGATIVE Final   Comment:            The GeneXpert MRSA Assay (FDA     approved for NASAL specimens     only), is one  component of a     comprehensive MRSA colonization  surveillance program. It is not     intended to diagnose MRSA     infection nor to guide or     monitor treatment for     MRSA infections.  URINE CULTURE     Status: None   Collection Time    06/04/13 12:50 PM      Result Value Range Status   Specimen Description URINE, CATHETERIZED   Final   Special Requests NONE   Final   Culture  Setup Time     Final   Value: 06/04/2013 12:50     Performed at [facility] Count     Final   Value: NO GROWTH     Performed at [facility]   Culture     Final   Value: NO GROWTH     Performed at [facility]   Report Status 06/05/2013 FINAL   Final  CULTURE, BLOOD (ROUTINE X 2)     Status: None   Collection Time    06/04/13  2:10 PM      Result Value Range Status   Specimen Description BLOOD RIGHT ARM   Final   Special Requests BOTTLES DRAWN AEROBIC ONLY 10CC   Final   Culture  Setup Time     Final   Value: 06/04/2013 16:44     Performed at [facility]   Culture     Final   Value: NO GROWTH 5 DAYS     Performed at [facility]   Report Status 06/10/2013 FINAL   Final  CULTURE, BLOOD (ROUTINE X 2)     Status: None   Collection Time    06/04/13  2:55 PM      Result Value Range Status   Specimen Description BLOOD RIGHT ARM   Final   Special Requests BOTTLES DRAWN AEROBIC AND ANAEROBIC 10CC   Final   Culture  Setup Time     Final   Value: 06/04/2013 16:45     Performed at [facility]   Culture     Final   Value: NO GROWTH 5 DAYS     Performed at [facility]   Report Status 06/10/2013 FINAL   Final  BODY FLUID CULTURE     Status: None   Collection Time    06/04/13  3:58 PM      Result Value Range Status   Specimen Description FLUID   Final   Special Requests NONE   Final   Gram Stain     Final   Value: WBC PRESENT,BOTH PMN AND MONONUCLEAR     FEW GRAM NEGATIVE RODS     Performed at [facility]    Culture     Final   Value: FEW ESCHERICHIA COLI     Performed at [facility]   Report Status 06/06/2013 FINAL   Final   Organism ID, Bacteria ESCHERICHIA COLI   Final  CLOSTRIDIUM DIFFICILE BY PCR     Status: None   Collection Time    06/09/13  9:45 AM      Result Value Range Status   C difficile by pcr NEGATIVE  NEGATIVE Final     RADIOLOGY STUDIES/RESULTS: Ir Nephrostomy Tube Change  06/09/2013   *RADIOLOGY REPORT*  Clinical data:  Right ureteral stricture with development of urosepsis after inadvertent   ureteral stent dislodgement, now status post percutaneous nephrostomy drainage x 5 days, clinically improved.  ANTEGRADE NEPHROSTOGRAM URETERAL STENT PLACEMENT EXCHANGE  OF RIGHT NEPHROSTOMY CATHETER UNDER FLUOROSCOPY  Technique and findings: The procedure, risks (including but not limited to bleeding, infection, organ damage), benefits, and alternatives were explained to the patient.  Questions regarding the procedure were encouraged and answered.  The patient understands and consents to the procedure.The patient placed prone. Right nephrostomy tube and surrounding skin prepped and draped usual sterile fashion. Maximal barrier sterile technique was utilized including caps, mask, sterile gowns, sterile gloves, sterile drape, hand hygiene and skin antiseptic.  The patient was receiving adequate antibiotic prophylactic coverage.  Intravenous Fentanyl and Versed were administered as conscious sedation during continuous cardiorespiratory monitoring by the radiology RN, with a total moderate sedation time of 22 minutes.  A small contrast injection through the nephrostomy catheter was performed to opacify the renal collecting system.  The catheter was cut and exchanged over [facility] wire for a 5-French Kumpe catheter, advanced for antegrade nephrostogram.  The nephrostogram demonstrated moderate filling defects within the central right renal collecting system and proximal ureter consistent with  retained clot.  There is some distention of the proximal ureter.  There is a short segment tortuous ureteral segment at approximately the L4 level but the catheter passed easily beyond the level as did the injected proximal contrast suggesting no   urodynamically significant stenosis here.  There is distention of the more distal ureter down to the level of the lower margin of the sacroiliac joint.  There is a long   tapered segment of the ureter extending several centimeters from this level to the level of the UVJ, with a small string of contrast seen with directed injection here.  The distal stricture was easily traversed and access to the lumen of the urinary bladder was achieved.  The Kumpe was exchanged for a 24 cm 8.5 French double-J ureteral stent, deployed over an Amplatz wire with the distal end in the urinary bladder, proximal end in the right renal collecting system.  Because of residual thrombus in the right renal collecting system, an new 10-French percutaneous nephrostomy pigtail catheter was advanced and positioned centrally in the right renal collecting system.  The nephrostomy catheter was secured externally with O-Prolene suture and capped to allow internal drainage. The patient tolerated the procedure well.  No immediate complication.  IMPRESSION: 1.  Antegrade nephrostogram shows a short nondistended tortuous segment in the proximal ureter, and a long segment stricture in the distal ureter extending over several centimeters just proximal to the UVJ. 2.  Technically successful 24 cm 8.5 French double-J ureteral stent placement. 3.  Exchange of percutaneous nephrostomy catheter, capped to allow internal drainage.   Original Report Authenticated By: D. Wallace Going, MD   Ir Nephrostogram Right  06/09/2013   *RADIOLOGY REPORT*  Clinical data:  Right ureteral stricture with development of urosepsis after inadvertent   ureteral stent dislodgement, now status post percutaneous nephrostomy drainage x 5  days, clinically improved.  ANTEGRADE NEPHROSTOGRAM URETERAL STENT PLACEMENT EXCHANGE OF RIGHT NEPHROSTOMY CATHETER UNDER FLUOROSCOPY  Technique and findings: The procedure, risks (including but not limited to bleeding, infection, organ damage), benefits, and alternatives were explained to the patient.  Questions regarding the procedure were encouraged and answered.  The patient understands and consents to the procedure.The patient placed prone. Right nephrostomy tube and surrounding skin prepped and draped usual sterile fashion. Maximal barrier sterile technique was utilized including caps, mask, sterile gowns, sterile gloves, sterile drape, hand hygiene and skin antiseptic.  The patient was receiving adequate antibiotic prophylactic coverage.  Intravenous Fentanyl and Versed were  administered as conscious sedation during continuous cardiorespiratory monitoring by the radiology RN, with a total moderate sedation time of 22 minutes.  A small contrast injection through the nephrostomy catheter was performed to opacify the renal collecting system.  The catheter was cut and exchanged over [facility] wire for a 5-French Kumpe catheter, advanced for antegrade nephrostogram.  The nephrostogram demonstrated moderate filling defects within the central right renal collecting system and proximal ureter consistent with retained clot.  There is some distention of the proximal ureter.  There is a short segment tortuous ureteral segment at approximately the L4 level but the catheter passed easily beyond the level as did the injected proximal contrast suggesting no   urodynamically significant stenosis here.  There is distention of the more distal ureter down to the level of the lower margin of the sacroiliac joint.  There is a long   tapered segment of the ureter extending several centimeters from this level to the level of the UVJ, with a small string of contrast seen with directed injection here.  The distal stricture was easily  traversed and access to the lumen of the urinary bladder was achieved.  The Kumpe was exchanged for a 24 cm 8.5 French double-J ureteral stent, deployed over an Amplatz wire with the distal end in the urinary bladder, proximal end in the right renal collecting system.  Because of residual thrombus in the right renal collecting system, an new 10-French percutaneous nephrostomy pigtail catheter was advanced and positioned centrally in the right renal collecting system.  The nephrostomy catheter was secured externally with O-Prolene suture and capped to allow internal drainage. The patient tolerated the procedure well.  No immediate complication.  IMPRESSION: 1.  Antegrade nephrostogram shows a short nondistended tortuous segment in the proximal ureter, and a long segment stricture in the distal ureter extending over several centimeters just proximal to the UVJ. 2.  Technically successful 24 cm 8.5 French double-J ureteral stent placement. 3.  Exchange of percutaneous nephrostomy catheter, capped to allow internal drainage.   Original Report Authenticated By: D. Wallace Going, MD   Ir Perc Nephrostomy Right  06/05/2013   *RADIOLOGY REPORT*   Clinical data: Clinical evidence of sepsis requiring intubation after attempted cystoscopic right renal decompression and stenting.  RIGHT PERCUTANEOUS NEPHROSTOMY CATHETER PLACEMENT UNDER ULTRASOUND AND FLUOROSCOPIC GUIDANCE  Technique: The procedure, risks (including but not limited to bleeding, infection, organ damage.  ), benefits, and alternatives were explained to the daughter, as the patient was intubated and unable to provide consent.  Questions regarding the procedure were encouraged and answered.  The daughter understands and consents to the procedure. The rightflank region prepped with Betadine, draped in usual sterile fashion, infiltrated locally with 1% lidocaine.The patient was already receiving adequate antibiotic coverage.  Intravenous Fentanyl and Versed were  administered as conscious sedation during continuous cardiorespiratory monitoring by the radiology RN, with a total moderate sedation time of 20 minutes.   Under real-time ultrasound guidance, a 21-gauge trocar needle was advanced into a posterior lower pole calyx. Ultrasound image documentation was saved. Urine spontaneously returned through the needle. Needle was exchanged over a guidewire for transitional dilator. Contrast injection confirmed appropriate positioning. Catheter was exchanged over a guidewire for a 10 French pigtail catheter, formed centrally within the right renal collecting system. Contrast injection confirms appropriate positioning and patency. Catheter   secured externally with 0 Prolene suture and placed to external drain bag. A 20 ml aspirate of the cloudy urine returned through the catheter was sent for  Gram stain, culture and sensitivity.  No immediate complication.  Fluoroscopy time: 19seconds  IMPRESSION Technically successful right percutaneous nephrostomy catheter placement.   Original Report Authenticated By: D. Wallace Going, MD   Ir US Guide Bx Asp/drain  06/05/2013   *RADIOLOGY REPORT*   Clinical data: Clinical evidence of sepsis requiring intubation after attempted cystoscopic right renal decompression and stenting.  RIGHT PERCUTANEOUS NEPHROSTOMY CATHETER PLACEMENT UNDER ULTRASOUND AND FLUOROSCOPIC GUIDANCE  Technique: The procedure, risks (including but not limited to bleeding, infection, organ damage.  ), benefits, and alternatives were explained to the daughter, as the patient was intubated and unable to provide consent.  Questions regarding the procedure were encouraged and answered.  The daughter understands and consents to the procedure. The rightflank region prepped with Betadine, draped in usual sterile fashion, infiltrated locally with 1% lidocaine.The patient was already receiving adequate antibiotic coverage.  Intravenous Fentanyl and Versed were administered as  conscious sedation during continuous cardiorespiratory monitoring by the radiology RN, with a total moderate sedation time of 20 minutes.   Under real-time ultrasound guidance, a 21-gauge trocar needle was advanced into a posterior lower pole calyx. Ultrasound image documentation was saved. Urine spontaneously returned through the needle. Needle was exchanged over a guidewire for transitional dilator. Contrast injection confirmed appropriate positioning. Catheter was exchanged over a guidewire for a 10 French pigtail catheter, formed centrally within the right renal collecting system. Contrast injection confirms appropriate positioning and patency. Catheter   secured externally with 0 Prolene suture and placed to external drain bag. A 20 ml aspirate of the cloudy urine returned through the catheter was sent for Gram stain, culture and sensitivity.  No immediate complication.  Fluoroscopy time: 19seconds  IMPRESSION Technically successful right percutaneous nephrostomy catheter placement.   Original Report Authenticated By: D. Wallace Going, MD   Dg Chest Port 1 View  06/09/2013   *RADIOLOGY REPORT*  Clinical Data: Cough, hypoxia  PORTABLE CHEST - 1 VIEW  Comparison: 06/07/2013  Findings: Cardiomediastinal silhouette is stable.  Central mild bronchitic changes.  Left IJ central line is stable in position. Right IJ catheter has been removed.  Persistent small left pleural effusion with left basilar atelectasis or infiltrate.  No pulmonary edema.  IMPRESSION: Central mild bronchitic changes.  Left IJ central line is stable in position.  Right IJ catheter has been removed.  Persistent small left pleural effusion with left basilar atelectasis or infiltrate. No pulmonary edema.   Original Report Authenticated By: Lahoma Crocker, M.D.   Dg Chest Port 1 View  06/07/2013   *RADIOLOGY REPORT*  Clinical Data: Short of breath  PORTABLE CHEST - 1 VIEW  Comparison: 06/06/2013  Findings: Endotracheal tube has been removed.  Right  jugular catheter tip in the SVC.  Left jugular catheter tip in the SVC.  NG tube has been removed.  Negative for pneumothorax.  Left lower lobe atelectasis and effusion unchanged.  Right lung remains clear.  IMPRESSION: Endotracheal tube and NG tubes have been removed.  Left lower lobe atelectasis and effusion are unchanged.   Original Report Authenticated By: Carl Best, M.D.   Dg Chest Port 1 View  06/06/2013   *RADIOLOGY REPORT*  Clinical Data: Evaluate endotracheal tube placement and line placement.  PORTABLE CHEST - 1 VIEW  Comparison: 06/05/2013.  Findings: An endotracheal tube is in place with tip 4.7 cm above the carina. There is a left-sided internal jugular central venous catheter with tip terminating in the mid superior vena cava. There is a right-sided internal jugular central venous  catheter with tip terminating in the mid superior vena cava. A nasogastric tube is seen extending into the stomach, however, the tip of the nasogastric tube extends below the lower margin of the image.  Lung volumes are slightly low.  Persistent left basilar opacity may reflect atelectasis and/or consolidation in the left lower lobe with superimposed small left pleural effusion.  Mild cephalization of the pulmonary vasculature, without frank pulmonary edema. Subsegmental atelectasis in the right mid to upper lung.  Heart size is normal.  Atherosclerosis in the thoracic aorta. The patient is rotated to the left on today's exam, resulting in distortion of the mediastinal contours and reduced diagnostic sensitivity and specificity for mediastinal pathology.  IMPRESSION: 1.  Support apparatus, as above. 2.  Pulmonary venous congestion, without frank pulmonary edema. 3.  Atelectasis and/or consolidation in the left lower lobe with a superimposed small left pleural effusion.   Original Report Authenticated By: Vinnie Langton, M.D.   Dg Chest Port 1 View  06/05/2013   *RADIOLOGY REPORT*  Clinical Data: Respiratory failure   PORTABLE CHEST - 1 VIEW  Comparison: 06/04/2013  Findings: Endotracheal tube is approximately 6 cm above the carina. Right jugular catheter tip in the SVC.  Left jugular catheter tip in the mid SVC.  Negative for pneumothorax.  Left lower lobe atelectasis and effusion unchanged.  Negative for pulmonary edema.  IMPRESSION: No significant change.  Left lower lobe consolidation and left effusion are stable.   Original Report Authenticated By: Carl Best, M.D.   Dg Chest Port 1 View  06/04/2013   *RADIOLOGY REPORT*  Clinical Data: Sepsis, renal failure.  PORTABLE CHEST - 1 VIEW  Comparison: Earlier film of the same day  Findings: Endotracheal tube, nasogastric tube, and left IJ central line are stable in position.  New right IJ dialysis catheter extends to the proximal SVC.  No pneumothorax.  Persistent left lower lung consolidation / atelectasis with adjacent effusion. Patchy perihilar interstitial infiltrates bilaterally as before.  IMPRESSION:  1.  Right IJ dialysis catheter placement to the proximal SVC, no pneumothorax.   Original Report Authenticated By: D. Wallace Going, MD   Dg Chest Port 1 View  06/04/2013   *RADIOLOGY REPORT*  Clinical Data: Endotracheal tube placement  PORTABLE CHEST - 1 VIEW  Comparison: 06/04/2013  Findings: Endotracheal tube is 2.5 cm above the carina.  Left jugular catheter tip in the SVC unchanged.  No pneumothorax.  NG tube enters the stomach.  Bibasilar atelectasis again noted.  Vascular congestion and mild edema appears improved.  IMPRESSION: Endotracheal tube remains in good position.  Bibasilar atelectasis unchanged.  Improvement in pulmonary edema.   Original Report Authenticated By: Carl Best, M.D.   Ir Oris Drone Cath Perc Right  06/09/2013   *RADIOLOGY REPORT*  Clinical data:  Right ureteral stricture with development of urosepsis after inadvertent   ureteral stent dislodgement, now status post percutaneous nephrostomy drainage x 5 days, clinically improved.  ANTEGRADE  NEPHROSTOGRAM URETERAL STENT PLACEMENT EXCHANGE OF RIGHT NEPHROSTOMY CATHETER UNDER FLUOROSCOPY  Technique and findings: The procedure, risks (including but not limited to bleeding, infection, organ damage), benefits, and alternatives were explained to the patient.  Questions regarding the procedure were encouraged and answered.  The patient understands and consents to the procedure.The patient placed prone. Right nephrostomy tube and surrounding skin prepped and draped usual sterile fashion. Maximal barrier sterile technique was utilized including caps, mask, sterile gowns, sterile gloves, sterile drape, hand hygiene and skin antiseptic.  The patient was receiving adequate antibiotic prophylactic  coverage.  Intravenous Fentanyl and Versed were administered as conscious sedation during continuous cardiorespiratory monitoring by the radiology RN, with a total moderate sedation time of 22 minutes.  A small contrast injection through the nephrostomy catheter was performed to opacify the renal collecting system.  The catheter was cut and exchanged over [facility] wire for a 5-French Kumpe catheter, advanced for antegrade nephrostogram.  The nephrostogram demonstrated moderate filling defects within the central right renal collecting system and proximal ureter consistent with retained clot.  There is some distention of the proximal ureter.  There is a short segment tortuous ureteral segment at approximately the L4 level but the catheter passed easily beyond the level as did the injected proximal contrast suggesting no   urodynamically significant stenosis here.  There is distention of the more distal ureter down to the level of the lower margin of the sacroiliac joint.  There is a long   tapered segment of the ureter extending several centimeters from this level to the level of the UVJ, with a small string of contrast seen with directed injection here.  The distal stricture was easily traversed and access to the lumen of the  urinary bladder was achieved.  The Kumpe was exchanged for a 24 cm 8.5 French double-J ureteral stent, deployed over an Amplatz wire with the distal end in the urinary bladder, proximal end in the right renal collecting system.  Because of residual thrombus in the right renal collecting system, an new 10-French percutaneous nephrostomy pigtail catheter was advanced and positioned centrally in the right renal collecting system.  The nephrostomy catheter was secured externally with O-Prolene suture and capped to allow internal drainage. The patient tolerated the procedure well.  No immediate complication.  IMPRESSION: 1.  Antegrade nephrostogram shows a short nondistended tortuous segment in the proximal ureter, and a long segment stricture in the distal ureter extending over several centimeters just proximal to the UVJ. 2.  Technically successful 24 cm 8.5 French double-J ureteral stent placement. 3.  Exchange of percutaneous nephrostomy catheter, capped to allow internal drainage.   Original Report Authenticated By: D. Wallace Going, MD    Oren Binet, MD  [facility] Pager:[phone]  If 7PM-7AM, please contact night-coverage [email or web address] Password [facility] 06/13/2013, 2:39 PM   LOS: 9 days

## 2013-06-13 NOTE — Progress Notes (Signed)
At assessment, noted ping pong size hematoma on R groin site.  Pulse +2 on R dorsalis pedis.  Patient denies pain at site except with palpation.  Bruising noted at site.  Notified Tylene Fantasia NP.  No orders at this time.  Tylene Fantasia NP stated she would visit patient soon to assess site.  Iantha Fallen RN [phone] 06/12/13

## 2013-06-13 NOTE — Progress Notes (Signed)
Subjective: Patient reports clear urine. Rectal tube out.   Objective: Vital signs in last 24 hours: Temp:  [98.1 F (36.7 C)-98.9 F (37.2 C)] 98.9 F (37.2 C) (08/18 0729) Pulse Rate:  [82-109] 85 (08/18 0729) Resp:  [18-29] 18 (08/18 0729) BP: (128-147)/(61-75) 147/70 mmHg (08/18 0729) SpO2:  [93 %-98 %] 97 % (08/18 0729) Weight:  [95.936 kg (211 lb 8 oz)] 95.936 kg (211 lb 8 oz) (08/18 0602)  Intake/Output from previous day: 08/17 0701 - 08/18 0700 In: 1861.6 [P.O.:800; I.V.:1011.6; IV Piggyback:50] Out: 3150 [Urine:3150] Intake/Output this shift:    Physical Exam:  General:alert and cooperative GI: not done and soft, non tender, normal bowel sounds, no palpable masses, no organomegaly, no inguinal hernia Female genitalia: not done normal external genitalia, vulva, vagina, cervix, uterus and adnexa   Lab Results:  Recent Labs  06/11/13 0600 06/12/13 0530 06/13/13 0622  HGB 9.2* 9.2* 8.9*  HCT 27.5* 27.2* 27.1*   BMET  Recent Labs  06/12/13 0530 06/13/13 0622  NA 143 141  K 3.2* 3.6  CL 106 106  CO2 27 28  GLUCOSE 94 92  BUN 16 15  CREATININE 1.23* 1.07  CALCIUM 8.3* 8.3*    Recent Labs  06/11/13 0600 06/12/13 0530 06/13/13 0622  INR 1.76* 1.94* 2.39*   No results found for this basename: LABURIN,  in the last 72 hours Results for orders placed during the hospital encounter of 06/04/13  MRSA PCR SCREENING     Status: None   Collection Time    06/04/13 12:29 PM      Result Value Range Status   MRSA by PCR NEGATIVE  NEGATIVE Final   Comment:            The GeneXpert MRSA Assay (FDA     approved for NASAL specimens     only), is one component of a     comprehensive MRSA colonization     surveillance program. It is not     intended to diagnose MRSA     infection nor to guide or     monitor treatment for     MRSA infections.  URINE CULTURE     Status: None   Collection Time    06/04/13 12:50 PM      Result Value Range Status   Specimen Description URINE, CATHETERIZED   Final   Special Requests NONE   Final   Culture  Setup Time     Final   Value: 06/04/2013 12:50     Performed at [facility]     Final   Value: NO GROWTH     Performed at [facility]   Culture     Final   Value: NO GROWTH     Performed at [facility]   Report Status 06/05/2013 FINAL   Final  CULTURE, BLOOD (ROUTINE X 2)     Status: None   Collection Time    06/04/13  2:10 PM      Result Value Range Status   Specimen Description BLOOD RIGHT ARM   Final   Special Requests BOTTLES DRAWN AEROBIC ONLY 10CC   Final   Culture  Setup Time     Final   Value: 06/04/2013 16:44     Performed at [facility]   Culture     Final   Value: NO GROWTH 5 DAYS     Performed at [facility]   Report Status 06/10/2013  FINAL   Final  CULTURE, BLOOD (ROUTINE X 2)     Status: None   Collection Time    06/04/13  2:55 PM      Result Value Range Status   Specimen Description BLOOD RIGHT ARM   Final   Special Requests BOTTLES DRAWN AEROBIC AND ANAEROBIC 10CC   Final   Culture  Setup Time     Final   Value: 06/04/2013 16:45     Performed at [facility]   Culture     Final   Value: NO GROWTH 5 DAYS     Performed at [facility]   Report Status 06/10/2013 FINAL   Final  BODY FLUID CULTURE     Status: None   Collection Time    06/04/13  3:58 PM      Result Value Range Status   Specimen Description FLUID   Final   Special Requests NONE   Final   Gram Stain     Final   Value: WBC PRESENT,BOTH PMN AND MONONUCLEAR     FEW GRAM NEGATIVE RODS     Performed at [facility]   Culture     Final   Value: FEW ESCHERICHIA COLI     Performed at [facility]   Report Status 06/06/2013 FINAL   Final   Organism ID, Bacteria ESCHERICHIA COLI   Final  CLOSTRIDIUM DIFFICILE BY PCR     Status: None   Collection Time    06/09/13  9:45 AM      Result Value Range Status   C difficile  by pcr NEGATIVE  NEGATIVE Final    Studies/Results: No results found.  Assessment/Plan: Hematuria Kidney Stones Foley: ready for d/s:  Right perc tube: ready for d/d in special procedures I have discussed R ureteral stricuree with patient. She has a long Right lower ureteral stricture, which would need open surgical repair. She is not ready for this type of surgery now, and is stable wit the JJ stent in place. She would RTC in 6-12 weeks for follow-up. We may choose to simply  Change her JJ stent q 6 months.    LOS: 9 days   [name] [name] I 06/13/2013, 8:15 AM

## 2013-06-13 NOTE — Progress Notes (Signed)
Per Dr. Gaynelle Arabian request, Right PCN has been removed with no immediate complications. Site with no bleeding, dressing C/D/I.  Tsosie Billing PA-C Interventional Radiology  06/13/13  1:42 PM

## 2013-06-13 NOTE — Progress Notes (Signed)
ANTICOAGULATION CONSULT NOTE - Follow Up Consult  Pharmacy Consult for Heparin + Coumadin Indication: NSTEMI/Afib  Allergies  Allergen Reactions  . Codeine Nausea Only    hallucinations  . Hydrocodone Nausea Only    hallucinations  . Macrodantin [Nitrofurantoin] Nausea And Vomiting    Patient Measurements: Height: 5\' 7"  (170.2 cm) Weight: 211 lb 8 oz (95.936 kg) IBW/kg (Calculated) : 61.6 Heparin Dosing Weight: 82kg  Vital Signs: Temp: 98.9 F (37.2 C) (08/18 0729) Temp src: Oral (08/18 0729) BP: 147/70 mmHg (08/18 0729) Pulse Rate: 85 (08/18 0729)  Labs:  Recent Labs  06/11/13 0600  06/12/13 0530 06/12/13 0653 06/13/13 0622  HGB 9.2*  --  9.2*  --  8.9*  HCT 27.5*  --  27.2*  --  27.1*  PLT 365  --  456*  --  465*  LABPROT 20.0*  --  21.6*  --  25.3*  INR 1.76*  --  1.94*  --  2.39*  HEPARINUNFRC 0.75*  < > 0.11* 0.37 0.54  CREATININE 1.28*  --  1.23*  --  1.07  < > = values in this interval not displayed.  Estimated Creatinine Clearance: 57.3 ml/min (by C-G formula based on Cr of 1.07).   Medications:  Heparin @ 1950 units/hr Amiodarone @ 30mg /hr  Assessment: 72yof continues on heparin and coumadin for NSTEMI and Afib. Heparin level is therapeutic. INR continues to trend up and is now therapeutic. She is transitioning from IV to PO amiodarone today so anticipate INR to increase further. RN noted a hematoma at R groin site last PM. Hematuria resolved today per MD note. Hgb slowly trending down, platelets stable.  Goal of Therapy:  Heparin level 0.3-0.7 units/ml INR 2-3 Monitor platelets by anticoagulation protocol: Yes   Plan:  1) D/C heparin with INR > 2 2) Coumadin 2.5mg  x 1 3) Follow up INR,CBC in AM  Deboraha Sprang 06/13/2013,8:25 AM

## 2013-06-13 NOTE — Progress Notes (Signed)
Subjective: patietn denies CP  No SOB at rest.  Does get SOB with walking No palpitations Objective: Filed Vitals:   06/12/13 1643 06/12/13 2100 06/13/13 0602 06/13/13 0729  BP: 138/68 137/70 145/61 147/70  Pulse: 84 109 91 85  Temp: 98.1 F (36.7 C) 98.6 F (37 C) 98.3 F (36.8 C) 98.9 F (37.2 C)  TempSrc: Oral   Oral  Resp: 21 20 18 18   Height:      Weight:   211 lb 8 oz (95.936 kg)   SpO2: 96% 93% 95% 97%   Weight change: -7 lb 3.1 oz (-3.264 kg)  Intake/Output Summary (Last 24 hours) at 06/13/13 0742 Last data filed at 06/13/13 0500  Gross per 24 hour  Intake 1861.6 ml  Output   3150 ml  Net -1288.4 ml    General: Alert, awake, oriented x3, in no acute distress Neck:  JVP is normal Heart: Regular rate and rhythm, without murmurs, rubs, gallops.  Lungs: Clear to auscultation.  No rales or wheezes. Exemities:  No edema.   Neuro: Grossly intact, nonfocal.  Tele  Afib 80s to 90s Lab Results: Results for orders placed during the hospital encounter of 06/04/13 (from the past 24 hour(s))  MAGNESIUM     Status: None   Collection Time    06/13/13  6:22 AM      Result Value Range   Magnesium 1.6  1.5 - 2.5 mg/dL  CBC WITH DIFFERENTIAL     Status: Abnormal (Preliminary result)   Collection Time    06/13/13  6:22 AM      Result Value Range   WBC 10.8 (*) 4.0 - 10.5 K/uL   RBC 3.39 (*) 3.87 - 5.11 MIL/uL   Hemoglobin 8.9 (*) 12.0 - 15.0 g/dL   HCT 27.1 (*) 36.0 - 46.0 %   MCV 79.9  78.0 - 100.0 fL   MCH 26.3  26.0 - 34.0 pg   MCHC 32.8  30.0 - 36.0 g/dL   RDW 16.0 (*) 11.5 - 15.5 %   Platelets 465 (*) 150 - 400 K/uL   Neutrophils Relative % PENDING  43 - 77 %   Neutro Abs PENDING  1.7 - 7.7 K/uL   Band Neutrophils PENDING  0 - 10 %   Lymphocytes Relative PENDING  12 - 46 %   Lymphs Abs PENDING  0.7 - 4.0 K/uL   Monocytes Relative PENDING  3 - 12 %   Monocytes Absolute PENDING  0.1 - 1.0 K/uL   Eosinophils Relative PENDING  0 - 5 %   Eosinophils Absolute PENDING   0.0 - 0.7 K/uL   Basophils Relative PENDING  0 - 1 %   Basophils Absolute PENDING  0.0 - 0.1 K/uL   WBC Morphology PENDING     RBC Morphology PENDING     Smear Review PENDING     nRBC PENDING  0 /100 WBC   Metamyelocytes Relative PENDING     Myelocytes PENDING     Promyelocytes Absolute PENDING     Blasts PENDING    RENAL FUNCTION PANEL     Status: Abnormal   Collection Time    06/13/13  6:22 AM      Result Value Range   Sodium 141  135 - 145 mEq/L   Potassium 3.6  3.5 - 5.1 mEq/L   Chloride 106  96 - 112 mEq/L   CO2 28  19 - 32 mEq/L   Glucose, Bld 92  70 - 99 mg/dL  BUN 15  6 - 23 mg/dL   Creatinine, Ser 1.07  0.50 - 1.10 mg/dL   Calcium 8.3 (*) 8.4 - 10.5 mg/dL   Phosphorus 3.0  2.3 - 4.6 mg/dL   Albumin 2.1 (*) 3.5 - 5.2 g/dL   GFR calc non Af Amer 51 (*) >90 mL/min   GFR calc Af Amer 59 (*) >90 mL/min    Studies/Results: Echo:  06/07/13  Left ventricle: The cavity size was normal. Wall thickness was normal. Systolic function was mildly reduced. The estimated ejection fraction was in the range of 45% to 50%. Wall motion was normal; there were no regional wall motion abnormalities. The study was not technically sufficient to allow evaluation of LV diastolic dysfunction due to atrial fibrillation.  ------------------------------------------------------------ Aortic valve: Structurally normal valve. Trileaflet. Cusp separation was normal. Doppler: Transvalvular velocity was within the normal range. There was no stenosis. No regurgitation.  ------------------------------------------------------------ Aorta: The aorta was normal, not dilated, and non-diseased.  ------------------------------------------------------------ Mitral valve: Structurally normal valve. Leaflet separation was normal. Doppler: Transvalvular velocity was within the normal range. There was no evidence for stenosis. Trivial  regurgitation.  ------------------------------------------------------------ Left atrium: The atrium was mildly dilated.  ------------------------------------------------------------ Atrial septum: No defect or patent foramen ovale was identified.  ------------------------------------------------------------ Right ventricle: The cavity size was normal. Wall thickness was normal. Systolic function was normal.  ------------------------------------------------------------ Pulmonic valve: Structurally normal valve. Cusp separation was normal. Doppler: Transvalvular velocity was within the normal range. No regurgitation.  ------------------------------------------------------------ Tricuspid valve: Structurally normal valve. Leaflet separation was normal. Doppler: Transvalvular velocity was within the normal range. No regurgitation.  ------------------------------------------------------------ Right atrium: The atrium was normal in size.  ------------------------------------------------------------ Pericardium: The pericardium was normal in appearance.  ------------------------------------------------------------ Post procedure conclusions Ascending Aorta:  - The aorta was normal, not dilated, and non-diseased.    Medications: Reviewed   @PROBHOSP @  1. Atrial fibrillation  Rates controlled  On heparin and IV amio.  INR now greater than 2. Would dc heparin.  Can switch amiodarone to 400 bid while here in hospital then 200 bid going home Follow HR while ambulating.  For now would recomm rate control and coumadin  Patient will follow up with primary cardiologist in [place] for long term cardiac care  2 NSTEMI  Peak troponin 5. Felt possibly demand ischemia in setting of afib and sepsis renal failure. Patient currently asymptomatic  Felt possibly demand related  Had multiple active problems.  Now without symptoms  Would continue medical management for now.     LOS: 9 days    Dorris Carnes 06/13/2013, 7:42 AM

## 2013-06-14 DIAGNOSIS — D649 Anemia, unspecified: Secondary | ICD-10-CM

## 2013-06-14 DIAGNOSIS — I724 Aneurysm of artery of lower extremity: Secondary | ICD-10-CM

## 2013-06-14 DIAGNOSIS — IMO0002 Reserved for concepts with insufficient information to code with codable children: Secondary | ICD-10-CM

## 2013-06-14 LAB — CBC WITH DIFFERENTIAL/PLATELET
Basophils Absolute: 0 10*3/uL (ref 0.0–0.1)
Basophils Relative: 0 % (ref 0–1)
Eosinophils Absolute: 0.1 10*3/uL (ref 0.0–0.7)
Eosinophils Relative: 1 % (ref 0–5)
HCT: 27.5 % — ABNORMAL LOW (ref 36.0–46.0)
Hemoglobin: 9 g/dL — ABNORMAL LOW (ref 12.0–15.0)
Lymphocytes Relative: 28 % (ref 12–46)
Lymphs Abs: 2.7 10*3/uL (ref 0.7–4.0)
MCH: 26.2 pg (ref 26.0–34.0)
MCHC: 32.7 g/dL (ref 30.0–36.0)
MCV: 80.2 fL (ref 78.0–100.0)
Monocytes Absolute: 1.1 10*3/uL — ABNORMAL HIGH (ref 0.1–1.0)
Monocytes Relative: 11 % (ref 3–12)
Neutro Abs: 5.9 10*3/uL (ref 1.7–7.7)
Neutrophils Relative %: 60 % (ref 43–77)
Platelets: 541 10*3/uL — ABNORMAL HIGH (ref 150–400)
RBC: 3.43 MIL/uL — ABNORMAL LOW (ref 3.87–5.11)
RDW: 15.9 % — ABNORMAL HIGH (ref 11.5–15.5)
WBC: 9.8 10*3/uL (ref 4.0–10.5)

## 2013-06-14 LAB — RENAL FUNCTION PANEL
Albumin: 2.2 g/dL — ABNORMAL LOW (ref 3.5–5.2)
BUN: 15 mg/dL (ref 6–23)
CO2: 27 mEq/L (ref 19–32)
Calcium: 8.5 mg/dL (ref 8.4–10.5)
Chloride: 110 mEq/L (ref 96–112)
Creatinine, Ser: 1.08 mg/dL (ref 0.50–1.10)
GFR calc Af Amer: 58 mL/min — ABNORMAL LOW (ref >90)
GFR calc non Af Amer: 50 mL/min — ABNORMAL LOW (ref >90)
Glucose, Bld: 88 mg/dL (ref 70–99)
Phosphorus: 3.3 mg/dL (ref 2.3–4.6)
Potassium: 3.4 mEq/L — ABNORMAL LOW (ref 3.5–5.1)
Sodium: 145 mEq/L (ref 135–145)

## 2013-06-14 LAB — MAGNESIUM: Magnesium: 1.6 mg/dL (ref 1.5–2.5)

## 2013-06-14 LAB — PROTIME-INR
INR: 2.26 — ABNORMAL HIGH (ref 0.00–1.49)
Prothrombin Time: 24.2 seconds — ABNORMAL HIGH (ref 11.6–15.2)

## 2013-06-14 MED ORDER — WARFARIN SODIUM 3 MG PO TABS
3.0000 mg | ORAL_TABLET | Freq: Once | ORAL | Status: AC
  Administered 2013-06-14: 3 mg via ORAL
  Filled 2013-06-14: qty 1

## 2013-06-14 NOTE — Progress Notes (Addendum)
ANTICOAGULATION CONSULT NOTE - Follow Up Consult  Pharmacy Consult for Coumadin Indication: atrial fibrillation  Allergies  Allergen Reactions  . Codeine Nausea Only    hallucinations  . Hydrocodone Nausea Only    hallucinations  . Macrodantin [Nitrofurantoin] Nausea And Vomiting    Patient Measurements: Height: 5\' 7"  (170.2 cm) Weight: 208 lb 4.8 oz (94.484 kg) IBW/kg (Calculated) : 61.6  Vital Signs: Temp: 97.7 F (36.5 C) (08/19 0612) Temp src: Oral (08/19 0612) BP: 169/94 mmHg (08/19 0612) Pulse Rate: 76 (08/19 0612)  Labs:  Recent Labs  06/12/13 0530 06/12/13 0653 06/13/13 0622 06/14/13 0602  HGB 9.2*  --  8.9* 9.0*  HCT 27.2*  --  27.1* 27.5*  PLT 456*  --  465* 541*  LABPROT 21.6*  --  25.3* 24.2*  INR 1.94*  --  2.39* 2.26*  HEPARINUNFRC 0.11* 0.37 0.54  --   CREATININE 1.23*  --  1.07 1.08    Estimated Creatinine Clearance: 56.4 ml/min (by C-G formula based on Cr of 1.08).  Assessment: 72yof continues on coumadin for afib (heparin d/c'ed 8/18 due to therapeutic INR). INR remains therapeutic today. She converted to NSR overnight on PO amiodarone. Per cardiology note she may not require long term amiodarone or coumadin as this afib was related to her acute illness. Hgb is low but stable. Hematuria resolving. Small right groin hematoma noted 8/18 which is unchanged today per MD note - to get ultrasound today.  HIT panel negative.  Goal of Therapy:  INR 2-3 Monitor platelets by anticoagulation protocol: Yes   Plan:  1) Coumadin 3mg  x 1 2) INR in AM 3) Follow up right groin ultrasound  [name] [name] 06/14/2013,8:36 AM

## 2013-06-14 NOTE — Progress Notes (Signed)
Physical Therapy Treatment Patient Details Name: [name] [name] MRN: [id number] DOB: August 12, 1941 Today's Date: 06/14/2013 Time: [id number] PT Time Calculation (min): 12 min  PT Assessment / Plan / Recommendation  History of Present Illness 72 yo female with hx kidney stones, frequent UTI, HTN tx 8/9 from [facility] with septic shock, acute renal failure and R pyelonephritis after failed ureteral stent placement 8/7.  S/p R perc nephrostomy 8/9. CVVHD.    PT Comments   Pt is progressing well with mobility and gait.  She has walked twice today with RW.  Pt is excited about the prospect of going home soon.    Follow Up Recommendations  Home health PT;Supervision for mobility/OOB     Does the patient have the potential to tolerate intense rehabilitation    Yes  Barriers to Discharge   None      Equipment Recommendations  None recommended by PT    Recommendations for Other Services   None  Frequency Min 3X/week   Progress towards PT Goals Progress towards PT goals: Progressing toward goals  Plan Current plan remains appropriate    Precautions / Restrictions Precautions Precautions: Fall Precaution Comments: due to generalized weakness   Pertinent Vitals/Pain See vitals flow sheet.     Mobility  Bed Mobility Supine to Sit: 6: Modified independent (Device/Increase time);With rails;HOB elevated Sit to Supine: 6: Modified independent (Device/Increase time);With rail;HOB elevated Details for Bed Mobility Assistance: used rail for leverage when getting into and out of bed Transfers Sit to Stand: 5: Supervision;With upper extremity assist;With armrests;From bed Stand to Sit: 5: Supervision;With armrests;To bed Details for Transfer Assistance: supervision for safety Ambulation/Gait Ambulation/Gait Assistance: 4: Min guard Ambulation Distance (Feet): 180 Feet Assistive device: Rolling walker Ambulation/Gait Assistance Details: min guard assist due to reports of generalized  weakness and 2/4 DOE Gait Pattern: Step-through pattern General Gait Details: this is pt's second walk today.  She walked earlier with her husband and plans to walk again with her husband before he leaves tonight.      Exercises General Exercises - Upper Extremity Shoulder Flexion: AROM;10 reps;Seated Elbow Flexion: AROM;Both;10 reps;Seated General Exercises - Lower Extremity Long Arc Quad: AROM;Both;10 reps;Seated Hip Flexion/Marching: AROM;Both;10 reps;Seated Toe Raises: AROM;Both;10 reps;Seated Heel Raises: AROM;Both;10 reps;Seated     PT Goals (current goals can now be found in the care plan section) Acute Rehab PT Goals Patient Stated Goal: To go home  Visit Information  Last PT Received On: 06/14/13 Assistance Needed: +1 History of Present Illness: 72 yo female with hx kidney stones, frequent UTI, HTN tx 8/9 from [facility] with septic shock, acute renal failure and R pyelonephritis after failed ureteral stent placement 8/7.  S/p R perc nephrostomy 8/9. CVVHD.     Subjective Data  Subjective: Pt reports that she is feeling better every day, stronger and less SOB.   Patient Stated Goal: To go home   Cognition  Cognition Arousal/Alertness: Awake/alert Behavior During Therapy: WFL for tasks assessed/performed Overall Cognitive Status: Within Functional Limits for tasks assessed       End of Session PT - End of Session Activity Tolerance: Patient limited by fatigue Patient left: in bed;with call bell/phone within reach;with family/visitor present (husband came in at end of session)    Wells Guiles B. [place], [place], DPT [phone]   06/14/2013, 4:04 PM

## 2013-06-14 NOTE — Progress Notes (Signed)
PATIENT DETAILS Name: [name] [name] [facility] Age: 72 y.o. Sex: female Date of Birth: [birth year] Admit Date: 06/04/2013 Admitting Physician Collene Gobble, MD FT:[id number] S, MD  Subjective: No major issues-Right Nephrostomy removed yesterday, Right groin hematoma essentially unchanged  Brief narrative:  72 yo female with hx HTN, mult kidney stones, frequent UTI presented 8/8 to [facility] with fevers, chills, AMS. On 8/7 she underwent cystoscopy and attempted ureteral stent placement for R ureteral stricture. Per daughter, the stent fell out prior to her d/c post procedure and pt/MD elected to leave it out, hoping that the "balloon" he used would help improve the stricture. She was d/c home 8/7 but developed worsening abd pain, n/v and was seen in urology office 8/8 am and had foley placed for urinary retention. She returned home and by that evening developed fevers, chills, nausea, AMS and was taken to [facility] where she was found to be in septic shock with acute renal failure. She required intubation for worsening resp distress and was tx to [facility].  Since admission this patient has been evaluated by urology who recommended placement of a percutaneous nephrostomy tube. This was accomplished in interventional radiology. Because of her severe acute renal failure she did require short-term CVVHD. In the interim this patient has a history of paroxysmal atrial fibrillation developed atrial fibrillation with RVR as well as abnormal elevation in troponin. Cardiology was consulted. It was felt the patient likely had demand ischemia due to acute stress in recent septic shock. Currently IV amiodarone infusion is being utilized for the atrial fibrillation. Patient stabilized and pressor agents were discontinued. Patient required intubation for less than 72 hours. She stabilized enough to transfer out to the step down unit and team 1 assumed care of this patient 06/08/2013, and was transferred to  Telemetry on 8/18.  Assessment/Plan:  Septic shock  -resolved  -etiology due to UTI/bacteremia  -Procalcitonin was greater than 175 with a lactic acid of 4.7 at presentation   E coli bacteremia/Pyelonephritis  -onset after GU tract instrumentation (failed insertion of ureteral stent)  -cont anbx's - Antibiotics started on 8/9- narrowed to Rocephin 8/11 - should continue for 2 wks total-stop date-8/23  Right hydronephrosis and stricture s/p percutaneous nephrostomy tube/hematuria  -Urology following  -perc nephrostomy placed in IR on 8/9 -removed by IR Per Dr. Gaynelle Arabian request on 8/18 -right JJ stent placed on 8/14  -foley present - urine draining well (but blood tinged)-will need to d/w urology if we can remove this as well.  Acute renal failure on chronic kidney disease  -Peak creatinine 3.22 at presentation with an associated GFR of 13  -required short term CVVHD until 8/11  -currently with excellent UOP, thankfully renal function has normalized  Acute respiratory failure with hypoxia  -Primarily related to acute septic shock  - required short term intubation   Diarrhea/ buttock rash  - pt was incontinent and has a severe rash on buttocks  - improved significantly after lomotil x 1 on 8/16  - c diff negative  - no further incontinence  - follow rash   Atrial fibrillation with rapid ventricular response  -Cardiology following  -was started Heparin and Coumadin, since INR now therapeutice Heparin has been discontinued on 8/18 -on Amiodarone and Coreg for rate control -TSH normal   NSTEMI, initial episode of care  -Peak troponin 5.06 this admission  -Cards following-adding low dose BB (8/15)  -no indications for ischemic work at this time since etiology suspected to be demand ischemia  Right upper extremity edema/significant right hand and thumb pain  -involves same arm as HD cath- this was removed - swelling improved significantly but not resolved  -Venous duplex  negative for DVT  -(8/15)developed subtle redness over right hand and thumb which is exquisitely tender to touch-  - Swelling completely resolved after 1 dose of Solumedrol and therfore was gout-will give few more days of steroids to ensure no recurrence. Now off prednisone  Anemia, unspecified  -baseline hgb unknown - was 12.7 at admit but this in setting of acute sepsis and DH  -FOB X 1 neg and anemia panel reveals AOCD with low iron levels -Hb remains stable  Small Right Groin Hematoma -monitor-for now-she is on anticoagulation -will check USG of the right groin  Disposition: Remain inpatient-home in next 1-2 days with HHPT  DVT Prophylaxis: Not needed as she is on coumadin with therapeutic INR  Code Status: Full code   Family Communication Husband at bedside  Procedures: L IJ CVL 8/9>>>  ETT 8/9>>>8/11  R IJ HD cath 8/9 >> 8/13  CONSULTS:  cardiology and pulmonary/intensive care   MEDICATIONS: Scheduled Meds: . amiodarone  400 mg Oral BID  . amitriptyline  25 mg Oral QHS  . carvedilol  3.125 mg Oral BID WC  . cefTRIAXone (ROCEPHIN)  IV  2 g Intravenous Q24H  . nystatin cream   Topical BID  . pantoprazole  40 mg Oral Q1200  . warfarin  3 mg Oral ONCE-1800  . Warfarin - Pharmacist Dosing Inpatient   Does not apply q1800   Continuous Infusions:  PRN Meds:.sodium chloride, acetaminophen, loperamide, metoprolol, ondansetron, sodium chloride, warfarin  Antibiotics: Anti-infectives   Start     Dose/Rate Route Frequency Ordered Stop   06/09/13 1000  ciprofloxacin (CIPRO) IVPB 400 mg  Status:  Discontinued    Comments:  Hang ON CALL to xray today   400 mg 200 mL/hr over 60 Minutes Intravenous  Once 06/09/13 0953 06/09/13 1011   06/07/13 1400  cefTRIAXone (ROCEPHIN) 2 g in dextrose 5 % 50 mL IVPB     2 g 100 mL/hr over 30 Minutes Intravenous Every 24 hours 06/07/13 0929 06/14/13 2359   06/06/13 1800  piperacillin-tazobactam (ZOSYN) IVPB 3.375 g  Status:   Discontinued     3.375 g 12.5 mL/hr over 240 Minutes Intravenous 3 times per day 06/06/13 1430 06/07/13 0929   06/04/13 1500  piperacillin-tazobactam (ZOSYN) IVPB 3.375 g  Status:  Discontinued     3.375 g 100 mL/hr over 30 Minutes Intravenous Every 6 hours 06/04/13 1441 06/06/13 1430   06/04/13 1400  piperacillin-tazobactam (ZOSYN) IVPB 2.25 g  Status:  Discontinued     2.25 g 100 mL/hr over 30 Minutes Intravenous 3 times per day 06/04/13 1333 06/04/13 1441       PHYSICAL EXAM: Vital signs in last 24 hours: Filed Vitals:   06/13/13 1533 06/13/13 1929 06/13/13 2100 06/14/13 0612  BP: 144/76  165/90 169/94  Pulse: 106  92 76  Temp: 99.2 F (37.3 C) 99.7 F (37.6 C) 98.5 F (36.9 C) 97.7 F (36.5 C)  TempSrc: Oral Oral Oral Oral  Resp: 23  20 18   Height:      Weight:    94.484 kg (208 lb 4.8 oz)  SpO2: 96%  96% 97%    Weight change: -1.452 kg (-3 lb 3.2 oz) Filed Weights   06/12/13 0547 06/13/13 0602 06/14/13 0612  Weight: 99.2 kg (218 lb 11.1 oz) 95.936 kg (211 lb 8 oz)  94.484 kg (208 lb 4.8 oz)   Body mass index is 32.62 kg/(m^2).   Gen Exam: Awake and alert with clear speech.   Neck: Supple, No JVD.   Chest: B/L Clear.   CVS: S1 S2 Regular, no murmurs.  Abdomen: soft, BS +, non tender, non distended.  Extremities: no edema, lower extremities warm to touch. Neurologic: Non Focal.   Skin: No Rash.   Wounds: N/A.    Intake/Output from previous day:  Intake/Output Summary (Last 24 hours) at 06/14/13 0856 Last data filed at 06/14/13 0824  Gross per 24 hour  Intake    660 ml  Output   3100 ml  Net  -2440 ml     LAB RESULTS: CBC  Recent Labs Lab 06/10/13 0259 06/11/13 0600 06/12/13 0530 06/13/13 0622 06/14/13 0602  WBC 11.1* 16.7* 11.1* 10.8* 9.8  HGB 9.7* 9.2* 9.2* 8.9* 9.0*  HCT 29.6* 27.5* 27.2* 27.1* 27.5*  PLT 253 365 456* 465* 541*  MCV 79.6 77.7* 78.2 79.9 80.2  MCH 26.1 26.0 26.4 26.3 26.2  MCHC 32.8 33.5 33.8 32.8 32.7  RDW 15.5 15.3  15.6* 16.0* 15.9*  LYMPHSABS 2.1 1.5 2.3 2.8 2.7  MONOABS 1.6* 1.5* 1.5* 1.3* 1.1*  EOSABS 0.6 0.0 0.1 0.1 0.1  BASOSABS 0.0 0.0 0.0 0.0 0.0    Chemistries   Recent Labs Lab 06/10/13 0259 06/11/13 0600 06/12/13 0530 06/13/13 0622 06/14/13 0602  NA 139 139 143 141 145  K 3.2* 3.5 3.2* 3.6 3.4*  CL 103 104 106 106 110  CO2 28 26 27 28 27   GLUCOSE 84 134* 94 92 88  BUN 17 17 16 15 15   CREATININE 1.41* 1.28* 1.23* 1.07 1.08  CALCIUM 8.5 8.4 8.3* 8.3* 8.5  MG 1.7 1.9 1.6 1.6 1.6    CBG:  Recent Labs Lab 06/10/13 0436 06/10/13 0747 06/10/13 1235 06/10/13 1614 06/11/13 0345  GLUCAP 93 81 114* 128* 148*    GFR Estimated Creatinine Clearance: 56.4 ml/min (by C-G formula based on Cr of 1.08).  Coagulation profile  Recent Labs Lab 06/10/13 0259 06/11/13 0600 06/12/13 0530 06/13/13 0622 06/14/13 0602  INR 1.25 1.76* 1.94* 2.39* 2.26*    Cardiac Enzymes No results found for this basename: CK, CKMB, TROPONINI, MYOGLOBIN,  in the last 168 hours  No components found with this basename: POCBNP,  No results found for this basename: DDIMER,  in the last 72 hours No results found for this basename: HGBA1C,  in the last 72 hours No results found for this basename: CHOL, HDL, LDLCALC, TRIG, CHOLHDL, LDLDIRECT,  in the last 72 hours No results found for this basename: TSH, T4TOTAL, FREET3, T3FREE, THYROIDAB,  in the last 72 hours No results found for this basename: VITAMINB12, FOLATE, FERRITIN, TIBC, IRON, RETICCTPCT,  in the last 72 hours No results found for this basename: LIPASE, AMYLASE,  in the last 72 hours  Urine Studies No results found for this basename: UACOL, UAPR, USPG, UPH, UTP, UGL, UKET, UBIL, UHGB, UNIT, UROB, ULEU, UEPI, UWBC, URBC, UBAC, CAST, CRYS, UCOM, BILUA,  in the last 72 hours  MICROBIOLOGY: Recent Results (from the past 240 hour(s))  MRSA PCR SCREENING     Status: None   Collection Time    06/04/13 12:29 PM      Result Value Range Status    MRSA by PCR NEGATIVE  NEGATIVE Final   Comment:            The GeneXpert MRSA Assay (FDA  approved for NASAL specimens     only), is one component of a     comprehensive MRSA colonization     surveillance program. It is not     intended to diagnose MRSA     infection nor to guide or     monitor treatment for     MRSA infections.  URINE CULTURE     Status: None   Collection Time    06/04/13 12:50 PM      Result Value Range Status   Specimen Description URINE, CATHETERIZED   Final   Special Requests NONE   Final   Culture  Setup Time     Final   Value: 06/04/2013 12:50     Performed at [facility] Count     Final   Value: NO GROWTH     Performed at [facility]   Culture     Final   Value: NO GROWTH     Performed at [facility]   Report Status 06/05/2013 FINAL   Final  CULTURE, BLOOD (ROUTINE X 2)     Status: None   Collection Time    06/04/13  2:10 PM      Result Value Range Status   Specimen Description BLOOD RIGHT ARM   Final   Special Requests BOTTLES DRAWN AEROBIC ONLY 10CC   Final   Culture  Setup Time     Final   Value: 06/04/2013 16:44     Performed at [facility]   Culture     Final   Value: NO GROWTH 5 DAYS     Performed at [facility]   Report Status 06/10/2013 FINAL   Final  CULTURE, BLOOD (ROUTINE X 2)     Status: None   Collection Time    06/04/13  2:55 PM      Result Value Range Status   Specimen Description BLOOD RIGHT ARM   Final   Special Requests BOTTLES DRAWN AEROBIC AND ANAEROBIC 10CC   Final   Culture  Setup Time     Final   Value: 06/04/2013 16:45     Performed at [facility]   Culture     Final   Value: NO GROWTH 5 DAYS     Performed at [facility]   Report Status 06/10/2013 FINAL   Final  BODY FLUID CULTURE     Status: None   Collection Time    06/04/13  3:58 PM      Result Value Range Status   Specimen Description FLUID   Final   Special Requests NONE   Final    Gram Stain     Final   Value: WBC PRESENT,BOTH PMN AND MONONUCLEAR     FEW GRAM NEGATIVE RODS     Performed at [facility]   Culture     Final   Value: FEW ESCHERICHIA COLI     Performed at [facility]   Report Status 06/06/2013 FINAL   Final   Organism ID, Bacteria ESCHERICHIA COLI   Final  CLOSTRIDIUM DIFFICILE BY PCR     Status: None   Collection Time    06/09/13  9:45 AM      Result Value Range Status   C difficile by pcr NEGATIVE  NEGATIVE Final     RADIOLOGY STUDIES/RESULTS: Ir Nephrostomy Tube Change  06/09/2013   *RADIOLOGY REPORT*  Clinical data:  Right ureteral stricture with development of urosepsis  after inadvertent   ureteral stent dislodgement, now status post percutaneous nephrostomy drainage x 5 days, clinically improved.  ANTEGRADE NEPHROSTOGRAM URETERAL STENT PLACEMENT EXCHANGE OF RIGHT NEPHROSTOMY CATHETER UNDER FLUOROSCOPY  Technique and findings: The procedure, risks (including but not limited to bleeding, infection, organ damage), benefits, and alternatives were explained to the patient.  Questions regarding the procedure were encouraged and answered.  The patient understands and consents to the procedure.The patient placed prone. Right nephrostomy tube and surrounding skin prepped and draped usual sterile fashion. Maximal barrier sterile technique was utilized including caps, mask, sterile gowns, sterile gloves, sterile drape, hand hygiene and skin antiseptic.  The patient was receiving adequate antibiotic prophylactic coverage.  Intravenous Fentanyl and Versed were administered as conscious sedation during continuous cardiorespiratory monitoring by the radiology RN, with a total moderate sedation time of 22 minutes.  A small contrast injection through the nephrostomy catheter was performed to opacify the renal collecting system.  The catheter was cut and exchanged over [facility] wire for a 5-French Kumpe catheter, advanced for antegrade nephrostogram.  The  nephrostogram demonstrated moderate filling defects within the central right renal collecting system and proximal ureter consistent with retained clot.  There is some distention of the proximal ureter.  There is a short segment tortuous ureteral segment at approximately the L4 level but the catheter passed easily beyond the level as did the injected proximal contrast suggesting no   urodynamically significant stenosis here.  There is distention of the more distal ureter down to the level of the lower margin of the sacroiliac joint.  There is a long   tapered segment of the ureter extending several centimeters from this level to the level of the UVJ, with a small string of contrast seen with directed injection here.  The distal stricture was easily traversed and access to the lumen of the urinary bladder was achieved.  The Kumpe was exchanged for a 24 cm 8.5 French double-J ureteral stent, deployed over an Amplatz wire with the distal end in the urinary bladder, proximal end in the right renal collecting system.  Because of residual thrombus in the right renal collecting system, an new 10-French percutaneous nephrostomy pigtail catheter was advanced and positioned centrally in the right renal collecting system.  The nephrostomy catheter was secured externally with O-Prolene suture and capped to allow internal drainage. The patient tolerated the procedure well.  No immediate complication.  IMPRESSION: 1.  Antegrade nephrostogram shows a short nondistended tortuous segment in the proximal ureter, and a long segment stricture in the distal ureter extending over several centimeters just proximal to the UVJ. 2.  Technically successful 24 cm 8.5 French double-J ureteral stent placement. 3.  Exchange of percutaneous nephrostomy catheter, capped to allow internal drainage.   Original Report Authenticated By: D. Wallace Going, MD   Ir Nephrostogram Right  06/09/2013   *RADIOLOGY REPORT*  Clinical data:  Right ureteral  stricture with development of urosepsis after inadvertent   ureteral stent dislodgement, now status post percutaneous nephrostomy drainage x 5 days, clinically improved.  ANTEGRADE NEPHROSTOGRAM URETERAL STENT PLACEMENT EXCHANGE OF RIGHT NEPHROSTOMY CATHETER UNDER FLUOROSCOPY  Technique and findings: The procedure, risks (including but not limited to bleeding, infection, organ damage), benefits, and alternatives were explained to the patient.  Questions regarding the procedure were encouraged and answered.  The patient understands and consents to the procedure.The patient placed prone. Right nephrostomy tube and surrounding skin prepped and draped usual sterile fashion. Maximal barrier sterile technique was utilized including caps, mask, sterile  gowns, sterile gloves, sterile drape, hand hygiene and skin antiseptic.  The patient was receiving adequate antibiotic prophylactic coverage.  Intravenous Fentanyl and Versed were administered as conscious sedation during continuous cardiorespiratory monitoring by the radiology RN, with a total moderate sedation time of 22 minutes.  A small contrast injection through the nephrostomy catheter was performed to opacify the renal collecting system.  The catheter was cut and exchanged over [facility] wire for a 5-French Kumpe catheter, advanced for antegrade nephrostogram.  The nephrostogram demonstrated moderate filling defects within the central right renal collecting system and proximal ureter consistent with retained clot.  There is some distention of the proximal ureter.  There is a short segment tortuous ureteral segment at approximately the L4 level but the catheter passed easily beyond the level as did the injected proximal contrast suggesting no   urodynamically significant stenosis here.  There is distention of the more distal ureter down to the level of the lower margin of the sacroiliac joint.  There is a long   tapered segment of the ureter extending several centimeters  from this level to the level of the UVJ, with a small string of contrast seen with directed injection here.  The distal stricture was easily traversed and access to the lumen of the urinary bladder was achieved.  The Kumpe was exchanged for a 24 cm 8.5 French double-J ureteral stent, deployed over an Amplatz wire with the distal end in the urinary bladder, proximal end in the right renal collecting system.  Because of residual thrombus in the right renal collecting system, an new 10-French percutaneous nephrostomy pigtail catheter was advanced and positioned centrally in the right renal collecting system.  The nephrostomy catheter was secured externally with O-Prolene suture and capped to allow internal drainage. The patient tolerated the procedure well.  No immediate complication.  IMPRESSION: 1.  Antegrade nephrostogram shows a short nondistended tortuous segment in the proximal ureter, and a long segment stricture in the distal ureter extending over several centimeters just proximal to the UVJ. 2.  Technically successful 24 cm 8.5 French double-J ureteral stent placement. 3.  Exchange of percutaneous nephrostomy catheter, capped to allow internal drainage.   Original Report Authenticated By: D. Wallace Going, MD   Ir Perc Nephrostomy Right  06/05/2013   *RADIOLOGY REPORT*   Clinical data: Clinical evidence of sepsis requiring intubation after attempted cystoscopic right renal decompression and stenting.  RIGHT PERCUTANEOUS NEPHROSTOMY CATHETER PLACEMENT UNDER ULTRASOUND AND FLUOROSCOPIC GUIDANCE  Technique: The procedure, risks (including but not limited to bleeding, infection, organ damage.  ), benefits, and alternatives were explained to the daughter, as the patient was intubated and unable to provide consent.  Questions regarding the procedure were encouraged and answered.  The daughter understands and consents to the procedure. The rightflank region prepped with Betadine, draped in usual sterile fashion,  infiltrated locally with 1% lidocaine.The patient was already receiving adequate antibiotic coverage.  Intravenous Fentanyl and Versed were administered as conscious sedation during continuous cardiorespiratory monitoring by the radiology RN, with a total moderate sedation time of 20 minutes.   Under real-time ultrasound guidance, a 21-gauge trocar needle was advanced into a posterior lower pole calyx. Ultrasound image documentation was saved. Urine spontaneously returned through the needle. Needle was exchanged over a guidewire for transitional dilator. Contrast injection confirmed appropriate positioning. Catheter was exchanged over a guidewire for a 10 French pigtail catheter, formed centrally within the right renal collecting system. Contrast injection confirms appropriate positioning and patency. Catheter   secured externally  with 0 Prolene suture and placed to external drain bag. A 20 ml aspirate of the cloudy urine returned through the catheter was sent for Gram stain, culture and sensitivity.  No immediate complication.  Fluoroscopy time: 19seconds  IMPRESSION Technically successful right percutaneous nephrostomy catheter placement.   Original Report Authenticated By: D. Wallace Going, MD   Ir US Guide Bx Asp/drain  06/05/2013   *RADIOLOGY REPORT*   Clinical data: Clinical evidence of sepsis requiring intubation after attempted cystoscopic right renal decompression and stenting.  RIGHT PERCUTANEOUS NEPHROSTOMY CATHETER PLACEMENT UNDER ULTRASOUND AND FLUOROSCOPIC GUIDANCE  Technique: The procedure, risks (including but not limited to bleeding, infection, organ damage.  ), benefits, and alternatives were explained to the daughter, as the patient was intubated and unable to provide consent.  Questions regarding the procedure were encouraged and answered.  The daughter understands and consents to the procedure. The rightflank region prepped with Betadine, draped in usual sterile fashion, infiltrated locally  with 1% lidocaine.The patient was already receiving adequate antibiotic coverage.  Intravenous Fentanyl and Versed were administered as conscious sedation during continuous cardiorespiratory monitoring by the radiology RN, with a total moderate sedation time of 20 minutes.   Under real-time ultrasound guidance, a 21-gauge trocar needle was advanced into a posterior lower pole calyx. Ultrasound image documentation was saved. Urine spontaneously returned through the needle. Needle was exchanged over a guidewire for transitional dilator. Contrast injection confirmed appropriate positioning. Catheter was exchanged over a guidewire for a 10 French pigtail catheter, formed centrally within the right renal collecting system. Contrast injection confirms appropriate positioning and patency. Catheter   secured externally with 0 Prolene suture and placed to external drain bag. A 20 ml aspirate of the cloudy urine returned through the catheter was sent for Gram stain, culture and sensitivity.  No immediate complication.  Fluoroscopy time: 19seconds  IMPRESSION Technically successful right percutaneous nephrostomy catheter placement.   Original Report Authenticated By: D. Wallace Going, MD   Dg Chest Port 1 View  06/09/2013   *RADIOLOGY REPORT*  Clinical Data: Cough, hypoxia  PORTABLE CHEST - 1 VIEW  Comparison: 06/07/2013  Findings: Cardiomediastinal silhouette is stable.  Central mild bronchitic changes.  Left IJ central line is stable in position. Right IJ catheter has been removed.  Persistent small left pleural effusion with left basilar atelectasis or infiltrate.  No pulmonary edema.  IMPRESSION: Central mild bronchitic changes.  Left IJ central line is stable in position.  Right IJ catheter has been removed.  Persistent small left pleural effusion with left basilar atelectasis or infiltrate. No pulmonary edema.   Original Report Authenticated By: Lahoma Crocker, M.D.   Dg Chest Port 1 View  06/07/2013   *RADIOLOGY REPORT*   Clinical Data: Short of breath  PORTABLE CHEST - 1 VIEW  Comparison: 06/06/2013  Findings: Endotracheal tube has been removed.  Right jugular catheter tip in the SVC.  Left jugular catheter tip in the SVC.  NG tube has been removed.  Negative for pneumothorax.  Left lower lobe atelectasis and effusion unchanged.  Right lung remains clear.  IMPRESSION: Endotracheal tube and NG tubes have been removed.  Left lower lobe atelectasis and effusion are unchanged.   Original Report Authenticated By: Carl Best, M.D.   Dg Chest Port 1 View  06/06/2013   *RADIOLOGY REPORT*  Clinical Data: Evaluate endotracheal tube placement and line placement.  PORTABLE CHEST - 1 VIEW  Comparison: 06/05/2013.  Findings: An endotracheal tube is in place with tip 4.7 cm above the carina. There  is a left-sided internal jugular central venous catheter with tip terminating in the mid superior vena cava. There is a right-sided internal jugular central venous catheter with tip terminating in the mid superior vena cava. A nasogastric tube is seen extending into the stomach, however, the tip of the nasogastric tube extends below the lower margin of the image.  Lung volumes are slightly low.  Persistent left basilar opacity may reflect atelectasis and/or consolidation in the left lower lobe with superimposed small left pleural effusion.  Mild cephalization of the pulmonary vasculature, without frank pulmonary edema. Subsegmental atelectasis in the right mid to upper lung.  Heart size is normal.  Atherosclerosis in the thoracic aorta. The patient is rotated to the left on today's exam, resulting in distortion of the mediastinal contours and reduced diagnostic sensitivity and specificity for mediastinal pathology.  IMPRESSION: 1.  Support apparatus, as above. 2.  Pulmonary venous congestion, without frank pulmonary edema. 3.  Atelectasis and/or consolidation in the left lower lobe with a superimposed small left pleural effusion.   Original Report  Authenticated By: Vinnie Langton, M.D.   Dg Chest Port 1 View  06/05/2013   *RADIOLOGY REPORT*  Clinical Data: Respiratory failure  PORTABLE CHEST - 1 VIEW  Comparison: 06/04/2013  Findings: Endotracheal tube is approximately 6 cm above the carina. Right jugular catheter tip in the SVC.  Left jugular catheter tip in the mid SVC.  Negative for pneumothorax.  Left lower lobe atelectasis and effusion unchanged.  Negative for pulmonary edema.  IMPRESSION: No significant change.  Left lower lobe consolidation and left effusion are stable.   Original Report Authenticated By: Carl Best, M.D.   Dg Chest Port 1 View  06/04/2013   *RADIOLOGY REPORT*  Clinical Data: Sepsis, renal failure.  PORTABLE CHEST - 1 VIEW  Comparison: Earlier film of the same day  Findings: Endotracheal tube, nasogastric tube, and left IJ central line are stable in position.  New right IJ dialysis catheter extends to the proximal SVC.  No pneumothorax.  Persistent left lower lung consolidation / atelectasis with adjacent effusion. Patchy perihilar interstitial infiltrates bilaterally as before.  IMPRESSION:  1.  Right IJ dialysis catheter placement to the proximal SVC, no pneumothorax.   Original Report Authenticated By: D. Wallace Going, MD   Dg Chest Port 1 View  06/04/2013   *RADIOLOGY REPORT*  Clinical Data: Endotracheal tube placement  PORTABLE CHEST - 1 VIEW  Comparison: 06/04/2013  Findings: Endotracheal tube is 2.5 cm above the carina.  Left jugular catheter tip in the SVC unchanged.  No pneumothorax.  NG tube enters the stomach.  Bibasilar atelectasis again noted.  Vascular congestion and mild edema appears improved.  IMPRESSION: Endotracheal tube remains in good position.  Bibasilar atelectasis unchanged.  Improvement in pulmonary edema.   Original Report Authenticated By: Carl Best, M.D.   Ir Oris Drone Cath Perc Right  06/09/2013   *RADIOLOGY REPORT*  Clinical data:  Right ureteral stricture with development of urosepsis after  inadvertent   ureteral stent dislodgement, now status post percutaneous nephrostomy drainage x 5 days, clinically improved.  ANTEGRADE NEPHROSTOGRAM URETERAL STENT PLACEMENT EXCHANGE OF RIGHT NEPHROSTOMY CATHETER UNDER FLUOROSCOPY  Technique and findings: The procedure, risks (including but not limited to bleeding, infection, organ damage), benefits, and alternatives were explained to the patient.  Questions regarding the procedure were encouraged and answered.  The patient understands and consents to the procedure.The patient placed prone. Right nephrostomy tube and surrounding skin prepped and draped usual sterile fashion. Maximal barrier sterile  technique was utilized including caps, mask, sterile gowns, sterile gloves, sterile drape, hand hygiene and skin antiseptic.  The patient was receiving adequate antibiotic prophylactic coverage.  Intravenous Fentanyl and Versed were administered as conscious sedation during continuous cardiorespiratory monitoring by the radiology RN, with a total moderate sedation time of 22 minutes.  A small contrast injection through the nephrostomy catheter was performed to opacify the renal collecting system.  The catheter was cut and exchanged over [facility] wire for a 5-French Kumpe catheter, advanced for antegrade nephrostogram.  The nephrostogram demonstrated moderate filling defects within the central right renal collecting system and proximal ureter consistent with retained clot.  There is some distention of the proximal ureter.  There is a short segment tortuous ureteral segment at approximately the L4 level but the catheter passed easily beyond the level as did the injected proximal contrast suggesting no   urodynamically significant stenosis here.  There is distention of the more distal ureter down to the level of the lower margin of the sacroiliac joint.  There is a long   tapered segment of the ureter extending several centimeters from this level to the level of the UVJ, with  a small string of contrast seen with directed injection here.  The distal stricture was easily traversed and access to the lumen of the urinary bladder was achieved.  The Kumpe was exchanged for a 24 cm 8.5 French double-J ureteral stent, deployed over an Amplatz wire with the distal end in the urinary bladder, proximal end in the right renal collecting system.  Because of residual thrombus in the right renal collecting system, an new 10-French percutaneous nephrostomy pigtail catheter was advanced and positioned centrally in the right renal collecting system.  The nephrostomy catheter was secured externally with O-Prolene suture and capped to allow internal drainage. The patient tolerated the procedure well.  No immediate complication.  IMPRESSION: 1.  Antegrade nephrostogram shows a short nondistended tortuous segment in the proximal ureter, and a long segment stricture in the distal ureter extending over several centimeters just proximal to the UVJ. 2.  Technically successful 24 cm 8.5 French double-J ureteral stent placement. 3.  Exchange of percutaneous nephrostomy catheter, capped to allow internal drainage.   Original Report Authenticated By: D. Wallace Going, MD    Oren Binet, MD  [facility] Pager:[phone]  If 7PM-7AM, please contact night-coverage [email or web address] Password [email or web address] 06/14/2013, 8:56 AM   LOS: 10 days

## 2013-06-14 NOTE — Progress Notes (Signed)
    Subjective:  Feeling better. No CP or dyspnea.  Objective:  Vital Signs in the last 24 hours: Temp:  [97.7 F (36.5 C)-99.7 F (37.6 C)] 97.7 F (36.5 C) (08/19 0612) Pulse Rate:  [76-106] 76 (08/19 0612) Resp:  [18-23] 18 (08/19 0612) BP: (144-169)/(73-94) 169/94 mmHg (08/19 0612) SpO2:  [95 %-97 %] 97 % (08/19 0612) Weight:  [208 lb 4.8 oz (94.484 kg)] 208 lb 4.8 oz (94.484 kg) (08/19 0612)  Intake/Output from previous day: 08/18 0701 - 08/19 0700 In: 420 [P.O.:420] Out: 3100 [Urine:3100]  Physical Exam: Pt is alert and oriented, NAD HEENT: normal Neck: JVP - normal Lungs: CTA bilaterally CV: RRR without murmur or gallop Abd: soft, NT, Positive BS, no hepatomegaly Ext: no C/C/E, distal pulses intact and equal Skin: warm/dry no rash   Lab Results:  Recent Labs  06/13/13 0622 06/14/13 0602  WBC 10.8* 9.8  HGB 8.9* 9.0*  PLT 465* 541*    Recent Labs  06/13/13 0622 06/14/13 0602  NA 141 145  K 3.6 3.4*  CL 106 110  CO2 28 27  GLUCOSE 92 88  BUN 15 15  CREATININE 1.07 1.08   No results found for this basename: TROPONINI, CK, MB,  in the last 72 hours  Tele: Sinus rhythm  Assessment/Plan:  1. Septic shock, resolved  2. Atrial fibrillation - has converted overnight to sinus rhythm. Continue Amiodarone 400 mg BID while here, then d/c home on 200 mg BID. Continue warfarin. Follow-up with primary cardiologist in [place]. May not need to stay on long-term amio or warfarin as I suspect this was related to her acute illness. Had similar episode a few years ago.  3. Increased troponin. Suspect demand ischemia in setting sepsis. No chest pain or ischemic symptoms.  Will sign off, but please call if questions arise. thx  Sherren Mocha, M.D. 06/14/2013, 8:14 AM

## 2013-06-14 NOTE — Progress Notes (Signed)
*  PRELIMINARY RESULTS* Vascular Ultrasound Right imited  Lower Extremity Arterial Duplex for pseudoaneurysm has been completed.   There is no evidence of pseudoaneurysm of the right groin. There is a complex lesion in the groin, correlating with the patient's contusion, which has some monophasic vascularity. This vascularity can be traced back to originate at the proximal femoral artery. Femoral artery flow is triphasic. Etiology of the complex lesion is unknown.  06/14/2013 12:03 PM Maudry Mayhew, RVT, RDCS, RDMS

## 2013-06-14 NOTE — Consult Note (Signed)
[facility] [place]      Consult Note  Patient name: [name] [name] MRN: [id number] DOB: Mar 04, 1941 Sex: female  Consulting Physician:  Hospital service  Reason for Consult: No chief complaint on file.   HISTORY OF PRESENT ILLNESS: 72 yo with complex hospital stay whom I am asked to evaluate right groin hematoma.  She presented on 8/8 to [facility] with fevers, chills, AMS. On 8/7 she underwent cystoscopy and attempted ureteral stent placement for R ureteral stricture. Per daughter, the stent fell out prior to her d/c post procedure and pt/MD elected to leave it out, hoping that the "balloon" he used would help improve the stricture. She was d/c home 8/7 but developed worsening abd pain, n/v and was seen in urology office 8/8 am and had foley placed for urinary retention. She returned home and by that evening developed fevers, chills, nausea, AMS and was taken to [facility] where she was found to be in septic shock with acute renal failure. She required intubation for worsening resp distress and was tx to [facility].   She has had perc nephrostomy tube by IR.  She required a short course of CVVHD. She has a history of paroxysmal atrial fibrillation developed atrial fibrillation with RVR as well as abnormal elevation in troponin. Cardiology was consulted. It was felt the patient likely had demand ischemia due to acute stress in recent septic shock. Currently IV amiodarone infusion is being utilized for the atrial fibrillation. Patient stabilized and pressor agents were discontinued. Patient required intubation for less than 72 hours.  On 8/9 she had a right femoral a-line place for invasive monitoring which was complicated by hematoma.  On 8/10 a ultrasound showed no evidence of pseudoaneurysm or AV fistula.  The ultrasound was repeated on 06/14/2013.  Again, no pseudoaneurysm was seen, but monophasic vascular activity was seen.    Past Medical History   Diagnosis Date  . GERD (gastroesophageal reflux disease)   . Kidney stones   . A-fib   . HTN (hypertension)   . Hyperlipidemia     Past Surgical History  Procedure Laterality Date  . Cystoscopy with ureteroscopy and stent placement Left   . Cystoscopy with stent placement Right   . Lumbar laminectomy      History   Social History  . Marital Status: Married    Spouse Name: N/A    Number of Children: N/A  . Years of Education: N/A   Occupational History  . Retired from [employer]    Social History Main Topics  . Smoking status: Never Smoker   . Smokeless tobacco: Not on file  . Alcohol Use: No  . Drug Use: No  . Sexual Activity: Not on file   Other Topics Concern  . Not on file   Social History Narrative   Lives in [place] with husband. Drives, walks some, but not very active at home.     History reviewed. No pertinent family history.  Allergies as of 06/04/2013 - Review Complete 06/04/2013  Allergen Reaction Noted  . Codeine  06/04/2013    No current facility-administered medications on file prior to encounter.   No current outpatient prescriptions on file prior to encounter.     REVIEW OF SYSTEMS: See H&P and HPI  PHYSICAL EXAMINATION: General: The patient appears their stated age.  Vital signs are BP 173/77  Pulse 69  Temp(Src) 98.4 F (36.9 C) (Oral)  Resp 18  Ht 5\' 7"  (1.702 m)  Wt 208  lb 4.8 oz (94.484 kg)  BMI 32.62 kg/m2  SpO2 96% Pulmonary: Respirations are non-labored HEENT:  No gross abnormalities Abdomen: Soft and non-tender  Musculoskeletal: There are no major deformities.   Neurologic: No focal weakness or paresthesias are detected, Skin: There are no ulcer or rashes noted. Psychiatric: The patient has normal affect. Cardiovascular: Palpable right femoral pulse with non-pulsatile buldge in right groin with ecchymosis.  Diagnostic Studies: I have reviewed her 2 ultrasounds which are negative for pseudoaneurysm   Assessment:   Right groin hematoma following arterial line insertion Plan: No action is required, as this appears to be a hematoma.  There is a question of monophasic flow.  I would recommend follow up ultrasound in my office in 4 weeks to make sure this resolved spontaneosly.  I will arrange for this.  Please call for further questions     V. Leia Alf, M.D. [facility] [place] Office: [phone] Pager:  [phone]

## 2013-06-14 NOTE — Progress Notes (Signed)
Pt's IV has been removed d/t pain and redness at the site. MD made aware and stated it would be okay to leave the Iv out. Will continue to monitor the pt. Hoover Brunette, RN

## 2013-06-15 ENCOUNTER — Other Ambulatory Visit: Payer: Self-pay | Admitting: *Deleted

## 2013-06-15 ENCOUNTER — Telehealth: Payer: Self-pay | Admitting: Surgery

## 2013-06-15 DIAGNOSIS — M79609 Pain in unspecified limb: Secondary | ICD-10-CM

## 2013-06-15 DIAGNOSIS — I739 Peripheral vascular disease, unspecified: Secondary | ICD-10-CM

## 2013-06-15 DIAGNOSIS — S301XXD Contusion of abdominal wall, subsequent encounter: Secondary | ICD-10-CM

## 2013-06-15 LAB — CBC WITH DIFFERENTIAL/PLATELET
Basophils Absolute: 0 10*3/uL (ref 0.0–0.1)
Basophils Relative: 0 % (ref 0–1)
Eosinophils Absolute: 0.2 10*3/uL (ref 0.0–0.7)
Eosinophils Relative: 2 % (ref 0–5)
HCT: 30.3 % — ABNORMAL LOW (ref 36.0–46.0)
Hemoglobin: 10 g/dL — ABNORMAL LOW (ref 12.0–15.0)
Lymphocytes Relative: 28 % (ref 12–46)
Lymphs Abs: 2.9 10*3/uL (ref 0.7–4.0)
MCH: 26.4 pg (ref 26.0–34.0)
MCHC: 33 g/dL (ref 30.0–36.0)
MCV: 79.9 fL (ref 78.0–100.0)
Monocytes Absolute: 1.1 10*3/uL — ABNORMAL HIGH (ref 0.1–1.0)
Monocytes Relative: 11 % (ref 3–12)
Neutro Abs: 6.1 10*3/uL (ref 1.7–7.7)
Neutrophils Relative %: 59 % (ref 43–77)
Platelets: 601 10*3/uL — ABNORMAL HIGH (ref 150–400)
RBC: 3.79 MIL/uL — ABNORMAL LOW (ref 3.87–5.11)
RDW: 15.9 % — ABNORMAL HIGH (ref 11.5–15.5)
WBC: 10.3 10*3/uL (ref 4.0–10.5)

## 2013-06-15 LAB — RENAL FUNCTION PANEL
Albumin: 2.6 g/dL — ABNORMAL LOW (ref 3.5–5.2)
BUN: 13 mg/dL (ref 6–23)
CO2: 26 mEq/L (ref 19–32)
Calcium: 8.8 mg/dL (ref 8.4–10.5)
Chloride: 107 mEq/L (ref 96–112)
Creatinine, Ser: 0.97 mg/dL (ref 0.50–1.10)
GFR calc Af Amer: 67 mL/min — ABNORMAL LOW (ref >90)
GFR calc non Af Amer: 57 mL/min — ABNORMAL LOW (ref >90)
Glucose, Bld: 86 mg/dL (ref 70–99)
Phosphorus: 3.7 mg/dL (ref 2.3–4.6)
Potassium: 2.8 mEq/L — ABNORMAL LOW (ref 3.5–5.1)
Sodium: 144 mEq/L (ref 135–145)

## 2013-06-15 LAB — PROTIME-INR
INR: 2.36 — ABNORMAL HIGH (ref 0.00–1.49)
Prothrombin Time: 25 seconds — ABNORMAL HIGH (ref 11.6–15.2)

## 2013-06-15 LAB — MAGNESIUM: Magnesium: 1.6 mg/dL (ref 1.5–2.5)

## 2013-06-15 MED ORDER — CARVEDILOL 6.25 MG PO TABS
6.2500 mg | ORAL_TABLET | Freq: Two times a day (BID) | ORAL | Status: DC
  Administered 2013-06-15 – 2013-06-16 (×2): 6.25 mg via ORAL
  Filled 2013-06-15 (×4): qty 1

## 2013-06-15 MED ORDER — POTASSIUM CHLORIDE 10 MEQ/100ML IV SOLN
10.0000 meq | INTRAVENOUS | Status: DC
  Filled 2013-06-15 (×3): qty 100

## 2013-06-15 MED ORDER — POTASSIUM CHLORIDE CRYS ER 20 MEQ PO TBCR
40.0000 meq | EXTENDED_RELEASE_TABLET | Freq: Once | ORAL | Status: AC
  Administered 2013-06-15: 40 meq via ORAL
  Filled 2013-06-15: qty 2

## 2013-06-15 MED ORDER — WARFARIN SODIUM 3 MG PO TABS
3.0000 mg | ORAL_TABLET | Freq: Once | ORAL | Status: AC
  Administered 2013-06-15: 3 mg via ORAL
  Filled 2013-06-15 (×2): qty 1

## 2013-06-15 NOTE — Progress Notes (Signed)
ANTICOAGULATION CONSULT NOTE - Follow Up Consult  Pharmacy Consult for Coumadin Indication: atrial fibrillation  Allergies  Allergen Reactions  . Codeine Nausea Only    hallucinations  . Hydrocodone Nausea Only    hallucinations  . Macrodantin [Nitrofurantoin] Nausea And Vomiting    Patient Measurements: Height: 5\' 7"  (170.2 cm) Weight: 202 lb 11.2 oz (91.944 kg) IBW/kg (Calculated) : 61.6  Vital Signs: Temp: 99.1 F (37.3 C) (08/20 0404) Temp src: Oral (08/20 0404) BP: 159/73 mmHg (08/20 0404) Pulse Rate: 71 (08/20 0404)  Labs:  Recent Labs  06/13/13 0622 06/14/13 0602 06/15/13 0545  HGB 8.9* 9.0* 10.0*  HCT 27.1* 27.5* 30.3*  PLT 465* 541* 601*  LABPROT 25.3* 24.2* 25.0*  INR 2.39* 2.26* 2.36*  HEPARINUNFRC 0.54  --   --   CREATININE 1.07 1.08 0.97    Estimated Creatinine Clearance: 61.9 ml/min (by C-G formula based on Cr of 0.97).  Assessment: 72yof continues on coumadin for afib. INR is therapeutic today. She converted to NSR yesterday on po amiodarone and remains in NSR. Hgb is improved. Hematuria resolving. Right groin ultrasound negative for pseudoaneurysm.   Goal of Therapy:  INR 2-3 Monitor platelets by anticoagulation protocol: Yes   Plan:  1) Coumadin 3mg  x 1  2) INR in AM  Deboraha Sprang 06/15/2013,8:28 AM

## 2013-06-15 NOTE — Care Management Note (Signed)
    Page 1 of 2   06/15/2013     10:22:56 AM   CARE MANAGEMENT NOTE 06/15/2013  Patient:  [name] [name], [name] [name]   Account Number:  [id number]  Date Initiated:  06/06/2013  Documentation initiated by:  [facility]  Subjective/Objective Assessment:   Septic - ?? pyleonephritis - intubated.     Action/Plan:   Anticipated DC Date:  06/13/2013   Anticipated DC Plan:  [place]  CM consult      [facility] Choice  HOME HEALTH   Choice offered to / List presented to:  C-1 Patient        [place] arranged  [place] [facility]      [place].   Status of service:  Completed, signed off Medicare Important Message given?   (If response is "NO", the following Medicare IM given date fields will be blank) Date Medicare IM given:   Date Additional Medicare IM given:    Discharge Disposition:  [place]  Per UR Regulation:  Reviewed for med. necessity/level of care/duration of stay  If discussed at [place] of Stay Meetings, dates discussed:   06/14/2013    Comments:  Contact:  Sachse,Harold Spouse [phone]                             Hammer,Allison Daughter [phone]   [phone]   06-15-13 [address] [place] [phone] CM did speak to pt and husband and they are agreeable to [facility] services PT. They chose [facility] for services. CM did make referral for services and SOC to begin within 24-48 hours post d/c.  06/10/13 [place] on heparin and amio gtts.

## 2013-06-15 NOTE — Progress Notes (Signed)
[facility] PROGRESS NOTE  [name] [name] [id number] DOB: 1941-02-21 DOA: 06/04/2013 PCP: [name], MD  Assessment/Plan:  Septic shock  -resolved  -etiology due to UTI  -Procalcitonin was greater than 175 with a lactic acid of 4.7 at presentation  E coli Pyelonephritis  -onset after GU tract instrumentation (failed insertion of ureteral stent)  -cont anbx's - Antibiotics started on 8/9- narrowed to Rocephin 8/11  - should continue for 2 wks total-stop date-8/23  Right hydronephrosis and stricture s/p percutaneous nephrostomy tube/hematuria  -Urology following  -perc nephrostomy placed in IR on 8/9 -removed by IR Per Dr. Gaynelle Arabian request on 8/18  -right JJ stent placed on 8/14  -foley present - urine draining well (but blood tinged) -d/c foley and observe Acute renal failure on chronic kidney disease  -Peak creatinine 3.22 at presentation with an associated GFR of 13  -required short term CVVHD until 8/11  -currently with excellent UOP, renal function has normalized  Acute respiratory failure with hypoxia  -Primarily related to acute septic shock  - required short term intubation  Diarrhea/ buttock rash  - pt was incontinent and has a severe rash on buttocks  - improved significantly after lomotil x 1 on 8/16  - c diff negative  - no further incontinence  - follow rash  Atrial fibrillation with rapid ventricular response  -Cardiology following  -was started Heparin and Coumadin, since INR now therapeutice Heparin has been discontinued on 8/18  -on Amiodarone and Coreg for rate control  -TSH normal  NSTEMI, initial episode of care  -Peak troponin 5.06 this admission  -Cards following-adding low dose BB (8/15)  -no indications for ischemic work at this time since etiology suspected to be demand ischemia  Right upper extremity edema/significant right hand and thumb pain  -involves same arm as HD cath- this was removed - swelling improved significantly but not  resolved  -Venous duplex negative for DVT  -(8/15)developed subtle redness over right hand and thumb which is exquisitely tender to touch-  - Swelling completely resolved after 1 dose of Solumedrol and therfore was gout-will give few more days of steroids to ensure no recurrence. Now off prednisone  Anemia, unspecified  -baseline hgb unknown - was 12.7 at admit but this in setting of acute sepsis and DH  -FOB X 1 neg and anemia panel reveals AOCD with low iron levels  -Hb remains stable  Small Right Groin Hematoma  -monitor-for now-she is on anticoagulation  -appreciate vascular consult--f/u with Dr. Trula Slade in 4 wks in office--no further intervention at this time Disposition:  D/c home 8/21 if able to urinate and stable DVT Prophylaxis:  Not needed as she is on coumadin with therapeutic INR  Code Status:  Full code  Family Communication  Husband at bedside  Procedures:  L IJ CVL 8/9>>>  ETT 8/9>>>8/11  R IJ HD cath 8/9 >> 8/13  CONSULTS:  cardiology and pulmonary/intensive care     Procedures/Studies: Ir Nephrostomy Tube Change  06/09/2013   *RADIOLOGY REPORT*  Clinical data:  Right ureteral stricture with development of urosepsis after inadvertent   ureteral stent dislodgement, now status post percutaneous nephrostomy drainage x 5 days, clinically improved.  ANTEGRADE NEPHROSTOGRAM URETERAL STENT PLACEMENT EXCHANGE OF RIGHT NEPHROSTOMY CATHETER UNDER FLUOROSCOPY  Technique and findings: The procedure, risks (including but not limited to bleeding, infection, organ damage), benefits, and alternatives were explained to the patient.  Questions regarding the procedure were encouraged and answered.  The patient understands and consents to the procedure.The patient  placed prone. Right nephrostomy tube and surrounding skin prepped and draped usual sterile fashion. Maximal barrier sterile technique was utilized including caps, mask, sterile gowns, sterile gloves, sterile drape, hand hygiene  and skin antiseptic.  The patient was receiving adequate antibiotic prophylactic coverage.  Intravenous Fentanyl and Versed were administered as conscious sedation during continuous cardiorespiratory monitoring by the radiology RN, with a total moderate sedation time of 22 minutes.  A small contrast injection through the nephrostomy catheter was performed to opacify the renal collecting system.  The catheter was cut and exchanged over [facility] wire for a 5-French Kumpe catheter, advanced for antegrade nephrostogram.  The nephrostogram demonstrated moderate filling defects within the central right renal collecting system and proximal ureter consistent with retained clot.  There is some distention of the proximal ureter.  There is a short segment tortuous ureteral segment at approximately the L4 level but the catheter passed easily beyond the level as did the injected proximal contrast suggesting no   urodynamically significant stenosis here.  There is distention of the more distal ureter down to the level of the lower margin of the sacroiliac joint.  There is a long   tapered segment of the ureter extending several centimeters from this level to the level of the UVJ, with a small string of contrast seen with directed injection here.  The distal stricture was easily traversed and access to the lumen of the urinary bladder was achieved.  The Kumpe was exchanged for a 24 cm 8.5 French double-J ureteral stent, deployed over an Amplatz wire with the distal end in the urinary bladder, proximal end in the right renal collecting system.  Because of residual thrombus in the right renal collecting system, an new 10-French percutaneous nephrostomy pigtail catheter was advanced and positioned centrally in the right renal collecting system.  The nephrostomy catheter was secured externally with O-Prolene suture and capped to allow internal drainage. The patient tolerated the procedure well.  No immediate complication.  IMPRESSION:  1.  Antegrade nephrostogram shows a short nondistended tortuous segment in the proximal ureter, and a long segment stricture in the distal ureter extending over several centimeters just proximal to the UVJ. 2.  Technically successful 24 cm 8.5 French double-J ureteral stent placement. 3.  Exchange of percutaneous nephrostomy catheter, capped to allow internal drainage.   Original Report Authenticated By: D. Wallace Going, MD   Ir Nephrostogram Right  06/09/2013   *RADIOLOGY REPORT*  Clinical data:  Right ureteral stricture with development of urosepsis after inadvertent   ureteral stent dislodgement, now status post percutaneous nephrostomy drainage x 5 days, clinically improved.  ANTEGRADE NEPHROSTOGRAM URETERAL STENT PLACEMENT EXCHANGE OF RIGHT NEPHROSTOMY CATHETER UNDER FLUOROSCOPY  Technique and findings: The procedure, risks (including but not limited to bleeding, infection, organ damage), benefits, and alternatives were explained to the patient.  Questions regarding the procedure were encouraged and answered.  The patient understands and consents to the procedure.The patient placed prone. Right nephrostomy tube and surrounding skin prepped and draped usual sterile fashion. Maximal barrier sterile technique was utilized including caps, mask, sterile gowns, sterile gloves, sterile drape, hand hygiene and skin antiseptic.  The patient was receiving adequate antibiotic prophylactic coverage.  Intravenous Fentanyl and Versed were administered as conscious sedation during continuous cardiorespiratory monitoring by the radiology RN, with a total moderate sedation time of 22 minutes.  A small contrast injection through the nephrostomy catheter was performed to opacify the renal collecting system.  The catheter was cut and exchanged over [facility] wire for  a 5-French Kumpe catheter, advanced for antegrade nephrostogram.  The nephrostogram demonstrated moderate filling defects within the central right renal collecting  system and proximal ureter consistent with retained clot.  There is some distention of the proximal ureter.  There is a short segment tortuous ureteral segment at approximately the L4 level but the catheter passed easily beyond the level as did the injected proximal contrast suggesting no   urodynamically significant stenosis here.  There is distention of the more distal ureter down to the level of the lower margin of the sacroiliac joint.  There is a long   tapered segment of the ureter extending several centimeters from this level to the level of the UVJ, with a small string of contrast seen with directed injection here.  The distal stricture was easily traversed and access to the lumen of the urinary bladder was achieved.  The Kumpe was exchanged for a 24 cm 8.5 French double-J ureteral stent, deployed over an Amplatz wire with the distal end in the urinary bladder, proximal end in the right renal collecting system.  Because of residual thrombus in the right renal collecting system, an new 10-French percutaneous nephrostomy pigtail catheter was advanced and positioned centrally in the right renal collecting system.  The nephrostomy catheter was secured externally with O-Prolene suture and capped to allow internal drainage. The patient tolerated the procedure well.  No immediate complication.  IMPRESSION: 1.  Antegrade nephrostogram shows a short nondistended tortuous segment in the proximal ureter, and a long segment stricture in the distal ureter extending over several centimeters just proximal to the UVJ. 2.  Technically successful 24 cm 8.5 French double-J ureteral stent placement. 3.  Exchange of percutaneous nephrostomy catheter, capped to allow internal drainage.   Original Report Authenticated By: D. Wallace Going, MD   Ir Perc Nephrostomy Right  06/05/2013   *RADIOLOGY REPORT*   Clinical data: Clinical evidence of sepsis requiring intubation after attempted cystoscopic right renal decompression and  stenting.  RIGHT PERCUTANEOUS NEPHROSTOMY CATHETER PLACEMENT UNDER ULTRASOUND AND FLUOROSCOPIC GUIDANCE  Technique: The procedure, risks (including but not limited to bleeding, infection, organ damage.  ), benefits, and alternatives were explained to the daughter, as the patient was intubated and unable to provide consent.  Questions regarding the procedure were encouraged and answered.  The daughter understands and consents to the procedure. The rightflank region prepped with Betadine, draped in usual sterile fashion, infiltrated locally with 1% lidocaine.The patient was already receiving adequate antibiotic coverage.  Intravenous Fentanyl and Versed were administered as conscious sedation during continuous cardiorespiratory monitoring by the radiology RN, with a total moderate sedation time of 20 minutes.   Under real-time ultrasound guidance, a 21-gauge trocar needle was advanced into a posterior lower pole calyx. Ultrasound image documentation was saved. Urine spontaneously returned through the needle. Needle was exchanged over a guidewire for transitional dilator. Contrast injection confirmed appropriate positioning. Catheter was exchanged over a guidewire for a 10 French pigtail catheter, formed centrally within the right renal collecting system. Contrast injection confirms appropriate positioning and patency. Catheter   secured externally with 0 Prolene suture and placed to external drain bag. A 20 ml aspirate of the cloudy urine returned through the catheter was sent for Gram stain, culture and sensitivity.  No immediate complication.  Fluoroscopy time: 19seconds  IMPRESSION Technically successful right percutaneous nephrostomy catheter placement.   Original Report Authenticated By: D. Wallace Going, MD   Ir US Guide Bx Asp/drain  06/05/2013   *RADIOLOGY REPORT*   Clinical data:  Clinical evidence of sepsis requiring intubation after attempted cystoscopic right renal decompression and stenting.  RIGHT  PERCUTANEOUS NEPHROSTOMY CATHETER PLACEMENT UNDER ULTRASOUND AND FLUOROSCOPIC GUIDANCE  Technique: The procedure, risks (including but not limited to bleeding, infection, organ damage.  ), benefits, and alternatives were explained to the daughter, as the patient was intubated and unable to provide consent.  Questions regarding the procedure were encouraged and answered.  The daughter understands and consents to the procedure. The rightflank region prepped with Betadine, draped in usual sterile fashion, infiltrated locally with 1% lidocaine.The patient was already receiving adequate antibiotic coverage.  Intravenous Fentanyl and Versed were administered as conscious sedation during continuous cardiorespiratory monitoring by the radiology RN, with a total moderate sedation time of 20 minutes.   Under real-time ultrasound guidance, a 21-gauge trocar needle was advanced into a posterior lower pole calyx. Ultrasound image documentation was saved. Urine spontaneously returned through the needle. Needle was exchanged over a guidewire for transitional dilator. Contrast injection confirmed appropriate positioning. Catheter was exchanged over a guidewire for a 10 French pigtail catheter, formed centrally within the right renal collecting system. Contrast injection confirms appropriate positioning and patency. Catheter   secured externally with 0 Prolene suture and placed to external drain bag. A 20 ml aspirate of the cloudy urine returned through the catheter was sent for Gram stain, culture and sensitivity.  No immediate complication.  Fluoroscopy time: 19seconds  IMPRESSION Technically successful right percutaneous nephrostomy catheter placement.   Original Report Authenticated By: D. Wallace Going, MD   Dg Chest Port 1 View  06/09/2013   *RADIOLOGY REPORT*  Clinical Data: Cough, hypoxia  PORTABLE CHEST - 1 VIEW  Comparison: 06/07/2013  Findings: Cardiomediastinal silhouette is stable.  Central mild bronchitic changes.   Left IJ central line is stable in position. Right IJ catheter has been removed.  Persistent small left pleural effusion with left basilar atelectasis or infiltrate.  No pulmonary edema.  IMPRESSION: Central mild bronchitic changes.  Left IJ central line is stable in position.  Right IJ catheter has been removed.  Persistent small left pleural effusion with left basilar atelectasis or infiltrate. No pulmonary edema.   Original Report Authenticated By: Lahoma Crocker, M.D.   Dg Chest Port 1 View  06/07/2013   *RADIOLOGY REPORT*  Clinical Data: Short of breath  PORTABLE CHEST - 1 VIEW  Comparison: 06/06/2013  Findings: Endotracheal tube has been removed.  Right jugular catheter tip in the SVC.  Left jugular catheter tip in the SVC.  NG tube has been removed.  Negative for pneumothorax.  Left lower lobe atelectasis and effusion unchanged.  Right lung remains clear.  IMPRESSION: Endotracheal tube and NG tubes have been removed.  Left lower lobe atelectasis and effusion are unchanged.   Original Report Authenticated By: Carl Best, M.D.   Dg Chest Port 1 View  06/06/2013   *RADIOLOGY REPORT*  Clinical Data: Evaluate endotracheal tube placement and line placement.  PORTABLE CHEST - 1 VIEW  Comparison: 06/05/2013.  Findings: An endotracheal tube is in place with tip 4.7 cm above the carina. There is a left-sided internal jugular central venous catheter with tip terminating in the mid superior vena cava. There is a right-sided internal jugular central venous catheter with tip terminating in the mid superior vena cava. A nasogastric tube is seen extending into the stomach, however, the tip of the nasogastric tube extends below the lower margin of the image.  Lung volumes are slightly low.  Persistent left basilar opacity may reflect atelectasis and/or  consolidation in the left lower lobe with superimposed small left pleural effusion.  Mild cephalization of the pulmonary vasculature, without frank pulmonary edema.  Subsegmental atelectasis in the right mid to upper lung.  Heart size is normal.  Atherosclerosis in the thoracic aorta. The patient is rotated to the left on today's exam, resulting in distortion of the mediastinal contours and reduced diagnostic sensitivity and specificity for mediastinal pathology.  IMPRESSION: 1.  Support apparatus, as above. 2.  Pulmonary venous congestion, without frank pulmonary edema. 3.  Atelectasis and/or consolidation in the left lower lobe with a superimposed small left pleural effusion.   Original Report Authenticated By: Vinnie Langton, M.D.   Dg Chest Port 1 View  06/05/2013   *RADIOLOGY REPORT*  Clinical Data: Respiratory failure  PORTABLE CHEST - 1 VIEW  Comparison: 06/04/2013  Findings: Endotracheal tube is approximately 6 cm above the carina. Right jugular catheter tip in the SVC.  Left jugular catheter tip in the mid SVC.  Negative for pneumothorax.  Left lower lobe atelectasis and effusion unchanged.  Negative for pulmonary edema.  IMPRESSION: No significant change.  Left lower lobe consolidation and left effusion are stable.   Original Report Authenticated By: Carl Best, M.D.   Dg Chest Port 1 View  06/04/2013   *RADIOLOGY REPORT*  Clinical Data: Sepsis, renal failure.  PORTABLE CHEST - 1 VIEW  Comparison: Earlier film of the same day  Findings: Endotracheal tube, nasogastric tube, and left IJ central line are stable in position.  New right IJ dialysis catheter extends to the proximal SVC.  No pneumothorax.  Persistent left lower lung consolidation / atelectasis with adjacent effusion. Patchy perihilar interstitial infiltrates bilaterally as before.  IMPRESSION:  1.  Right IJ dialysis catheter placement to the proximal SVC, no pneumothorax.   Original Report Authenticated By: D. Wallace Going, MD   Dg Chest Port 1 View  06/04/2013   *RADIOLOGY REPORT*  Clinical Data: Endotracheal tube placement  PORTABLE CHEST - 1 VIEW  Comparison: 06/04/2013  Findings: Endotracheal tube  is 2.5 cm above the carina.  Left jugular catheter tip in the SVC unchanged.  No pneumothorax.  NG tube enters the stomach.  Bibasilar atelectasis again noted.  Vascular congestion and mild edema appears improved.  IMPRESSION: Endotracheal tube remains in good position.  Bibasilar atelectasis unchanged.  Improvement in pulmonary edema.   Original Report Authenticated By: Carl Best, M.D.   Ir Oris Drone Cath Perc Right  06/09/2013   *RADIOLOGY REPORT*  Clinical data:  Right ureteral stricture with development of urosepsis after inadvertent   ureteral stent dislodgement, now status post percutaneous nephrostomy drainage x 5 days, clinically improved.  ANTEGRADE NEPHROSTOGRAM URETERAL STENT PLACEMENT EXCHANGE OF RIGHT NEPHROSTOMY CATHETER UNDER FLUOROSCOPY  Technique and findings: The procedure, risks (including but not limited to bleeding, infection, organ damage), benefits, and alternatives were explained to the patient.  Questions regarding the procedure were encouraged and answered.  The patient understands and consents to the procedure.The patient placed prone. Right nephrostomy tube and surrounding skin prepped and draped usual sterile fashion. Maximal barrier sterile technique was utilized including caps, mask, sterile gowns, sterile gloves, sterile drape, hand hygiene and skin antiseptic.  The patient was receiving adequate antibiotic prophylactic coverage.  Intravenous Fentanyl and Versed were administered as conscious sedation during continuous cardiorespiratory monitoring by the radiology RN, with a total moderate sedation time of 22 minutes.  A small contrast injection through the nephrostomy catheter was performed to opacify the renal collecting system.  The catheter was  cut and exchanged over [facility] wire for a 5-French Kumpe catheter, advanced for antegrade nephrostogram.  The nephrostogram demonstrated moderate filling defects within the central right renal collecting system and proximal ureter  consistent with retained clot.  There is some distention of the proximal ureter.  There is a short segment tortuous ureteral segment at approximately the L4 level but the catheter passed easily beyond the level as did the injected proximal contrast suggesting no   urodynamically significant stenosis here.  There is distention of the more distal ureter down to the level of the lower margin of the sacroiliac joint.  There is a long   tapered segment of the ureter extending several centimeters from this level to the level of the UVJ, with a small string of contrast seen with directed injection here.  The distal stricture was easily traversed and access to the lumen of the urinary bladder was achieved.  The Kumpe was exchanged for a 24 cm 8.5 French double-J ureteral stent, deployed over an Amplatz wire with the distal end in the urinary bladder, proximal end in the right renal collecting system.  Because of residual thrombus in the right renal collecting system, an new 10-French percutaneous nephrostomy pigtail catheter was advanced and positioned centrally in the right renal collecting system.  The nephrostomy catheter was secured externally with O-Prolene suture and capped to allow internal drainage. The patient tolerated the procedure well.  No immediate complication.  IMPRESSION: 1.  Antegrade nephrostogram shows a short nondistended tortuous segment in the proximal ureter, and a long segment stricture in the distal ureter extending over several centimeters just proximal to the UVJ. 2.  Technically successful 24 cm 8.5 French double-J ureteral stent placement. 3.  Exchange of percutaneous nephrostomy catheter, capped to allow internal drainage.   Original Report Authenticated By: D. Wallace Going, MD         Subjective: Patient denies fevers, chills, chest discomfort, shortness of breath, nausea, vomiting, diarrhea, abdominal pain, dysuria, hematuria. No rashes.  Objective: Filed Vitals:   06/14/13 2149  06/14/13 2244 06/15/13 0404 06/15/13 1345  BP: 167/70 177/74 159/73 155/65  Pulse:  71 71 70  Temp: 99 F (37.2 C)  99.1 F (37.3 C) 98.9 F (37.2 C)  TempSrc: Oral  Oral Oral  Resp:  18 18 18   Height:      Weight:   91.944 kg (202 lb 11.2 oz)   SpO2:  98% 95% 98%    Intake/Output Summary (Last 24 hours) at 06/15/13 1714 Last data filed at 06/15/13 1300  Gross per 24 hour  Intake    720 ml  Output   2926 ml  Net  -2206 ml   Weight change: -2.54 kg (-5 lb 9.6 oz) Exam:   General:  Pt is alert, follows commands appropriately, not in acute distress  HEENT: No icterus, No thrush, No neck mass, [place]/AT  Cardiovascular: RRR, S1/S2, no rubs, no gallops  Respiratory: CTA bilaterally, no wheezing, no crackles, no rhonchi  Abdomen: Soft/+BS, non tender, non distended, no guarding  Extremities: No edema, No lymphangitis, No petechiae, No rashes, no synovitis  Data Reviewed: Basic Metabolic Panel:  Recent Labs Lab 06/11/13 0600 06/12/13 0530 06/13/13 0622 06/14/13 0602 06/15/13 0545  NA 139 143 141 145 144  K 3.5 3.2* 3.6 3.4* 2.8*  CL 104 106 106 110 107  CO2 26 27 28 27 26   GLUCOSE 134* 94 92 88 86  BUN 17 16 15 15 13   CREATININE 1.28* 1.23* 1.07 1.08  0.97  CALCIUM 8.4 8.3* 8.3* 8.5 8.8  MG 1.9 1.6 1.6 1.6 1.6  PHOS 2.8 3.2 3.0 3.3 3.7   Liver Function Tests:  Recent Labs Lab 06/11/13 0600 06/12/13 0530 06/13/13 0622 06/14/13 0602 06/15/13 0545  ALBUMIN 2.0* 2.1* 2.1* 2.2* 2.6*   No results found for this basename: LIPASE, AMYLASE,  in the last 168 hours No results found for this basename: AMMONIA,  in the last 168 hours CBC:  Recent Labs Lab 06/11/13 0600 06/12/13 0530 06/13/13 0622 06/14/13 0602 06/15/13 0545  WBC 16.7* 11.1* 10.8* 9.8 10.3  NEUTROABS 13.7* 7.2 6.6 5.9 6.1  HGB 9.2* 9.2* 8.9* 9.0* 10.0*  HCT 27.5* 27.2* 27.1* 27.5* 30.3*  MCV 77.7* 78.2 79.9 80.2 79.9  PLT 365 456* 465* 541* 601*   Cardiac Enzymes: No results found for  this basename: CKTOTAL, CKMB, CKMBINDEX, TROPONINI,  in the last 168 hours BNP: No components found with this basename: POCBNP,  CBG:  Recent Labs Lab 06/10/13 0436 06/10/13 0747 06/10/13 1235 06/10/13 1614 06/11/13 0345  GLUCAP 93 81 114* 128* 148*    Recent Results (from the past 240 hour(s))  CLOSTRIDIUM DIFFICILE BY PCR     Status: None   Collection Time    06/09/13  9:45 AM      Result Value Range Status   C difficile by pcr NEGATIVE  NEGATIVE Final     Scheduled Meds: . amiodarone  400 mg Oral BID  . amitriptyline  25 mg Oral QHS  . carvedilol  3.125 mg Oral BID WC  . nystatin cream   Topical BID  . pantoprazole  40 mg Oral Q1200  . warfarin  3 mg Oral ONCE-1800  . Warfarin - Pharmacist Dosing Inpatient   Does not apply q1800   Continuous Infusions:    Hollin Crewe, DO  [facility] Pager [phone]  If 7PM-7AM, please contact night-coverage [email or web address] Password [email or web address] 06/15/2013, 5:14 PM   LOS: 11 days

## 2013-06-15 NOTE — Telephone Encounter (Addendum)
Message copied by Doristine Section on Wed Jun 15, 2013  9:05 AM ------      Message from: Serafina Mitchell      Created: Tue Jun 14, 2013  9:41 PM       Level 3 consult on 06/14/2013 for right femoral hematoma.  Please schedule her for an office visit in 4 weeks with right groin u/s to r/o pseudoaneurysm ---  --notified patient of fu with dr. Trula Slade on 07-18-13  at 10:30 am-

## 2013-06-16 LAB — CBC WITH DIFFERENTIAL/PLATELET
Basophils Absolute: 0.1 10*3/uL (ref 0.0–0.1)
Basophils Relative: 1 % (ref 0–1)
Eosinophils Absolute: 0.3 10*3/uL (ref 0.0–0.7)
Eosinophils Relative: 3 % (ref 0–5)
HCT: 30.1 % — ABNORMAL LOW (ref 36.0–46.0)
Hemoglobin: 9.8 g/dL — ABNORMAL LOW (ref 12.0–15.0)
Lymphocytes Relative: 26 % (ref 12–46)
Lymphs Abs: 2.7 10*3/uL (ref 0.7–4.0)
MCH: 26.3 pg (ref 26.0–34.0)
MCHC: 32.6 g/dL (ref 30.0–36.0)
MCV: 80.7 fL (ref 78.0–100.0)
Monocytes Absolute: 1.1 10*3/uL — ABNORMAL HIGH (ref 0.1–1.0)
Monocytes Relative: 11 % (ref 3–12)
Neutro Abs: 6.1 10*3/uL (ref 1.7–7.7)
Neutrophils Relative %: 59 % (ref 43–77)
Platelets: 587 10*3/uL — ABNORMAL HIGH (ref 150–400)
RBC: 3.73 MIL/uL — ABNORMAL LOW (ref 3.87–5.11)
RDW: 16.2 % — ABNORMAL HIGH (ref 11.5–15.5)
WBC: 10.3 10*3/uL (ref 4.0–10.5)

## 2013-06-16 LAB — RENAL FUNCTION PANEL
Albumin: 2.5 g/dL — ABNORMAL LOW (ref 3.5–5.2)
BUN: 11 mg/dL (ref 6–23)
CO2: 25 mEq/L (ref 19–32)
Calcium: 8.5 mg/dL (ref 8.4–10.5)
Chloride: 107 mEq/L (ref 96–112)
Creatinine, Ser: 0.92 mg/dL (ref 0.50–1.10)
GFR calc Af Amer: 71 mL/min — ABNORMAL LOW (ref >90)
GFR calc non Af Amer: 61 mL/min — ABNORMAL LOW (ref >90)
Glucose, Bld: 86 mg/dL (ref 70–99)
Phosphorus: 3.4 mg/dL (ref 2.3–4.6)
Potassium: 3.4 mEq/L — ABNORMAL LOW (ref 3.5–5.1)
Sodium: 142 mEq/L (ref 135–145)

## 2013-06-16 LAB — PROTIME-INR
INR: 2.38 — ABNORMAL HIGH (ref 0.00–1.49)
Prothrombin Time: 25.2 seconds — ABNORMAL HIGH (ref 11.6–15.2)

## 2013-06-16 LAB — MAGNESIUM: Magnesium: 1.6 mg/dL (ref 1.5–2.5)

## 2013-06-16 MED ORDER — CEPHALEXIN 500 MG PO CAPS: 500.0000 mg | ORAL_CAPSULE | Freq: Four times a day (QID) | ORAL | Status: DC

## 2013-06-16 MED ORDER — AMIODARONE HCL 200 MG PO TABS: 200.0000 mg | ORAL_TABLET | Freq: Two times a day (BID) | ORAL | Status: DC

## 2013-06-16 MED ORDER — CARVEDILOL 6.25 MG PO TABS: 6.2500 mg | ORAL_TABLET | Freq: Two times a day (BID) | ORAL | Status: DC

## 2013-06-16 MED ORDER — WARFARIN SODIUM 3 MG PO TABS: 3.0000 mg | ORAL_TABLET | Freq: Every day | ORAL | Status: DC

## 2013-06-16 NOTE — Discharge Summary (Signed)
Physician Discharge Summary  [name] [name] [id number] DOB: [birth year] DOA: 06/04/2013  PCP: [name], MD  Admit date: 06/04/2013 Discharge date: 06/16/2013  Recommendations for Outpatient Follow-up:  1. Pt will need to follow up with PCP in 1 week post discharge 2. Please obtain BMP to evaluate electrolytes and kidney function 3. Please also check CBC to evaluate Hg and Hct levels 4. Follow up with cardiology, Dr. Agustin Cree in [place] in 1 week  Discharge Diagnoses:  Active Problems:   Septic shock(785.52)   Acute respiratory failure with hypoxia   Pyelonephritis   Ureteral obstruction, right   Acute renal failure   Atrial fibrillation with rapid ventricular response   NSTEMI, initial episode of care   E coli bacteremia   Anemia, unspecified   Diarrhea Septic shock  -resolved  -etiology due to UTI  -Procalcitonin was greater than 175 with a lactic acid of 4.7 at presentation  E coli Pyelonephritis  -onset after GU tract instrumentation (failed insertion of ureteral stent)  -cont anbx's - Antibiotics started on 8/9- narrowed to Rocephin 8/11  - should continue for 2 wks  -pt will be d/ced with 4 additional days of cephalexin 500mg , 4 times daily Right hydronephrosis and stricture s/p percutaneous nephrostomy tube/hematuria  -Urology following  -perc nephrostomy placed in IR on 8/9 -removed by IR Per Dr. Gaynelle Arabian request on 8/18  -right JJ stent placed on 8/14  -foley present - urine draining well (but blood tinged)  -d/c foley 06/15/13 -pt able to urinate without foley without difficulty -f/u with Dr. Gaynelle Arabian in office Acute renal failure on chronic kidney disease  -Peak creatinine 3.22 at presentation with an associated GFR of 13  -required short term CVVHD until 8/11  -currently with excellent UOP, renal function has normalized -creatinine 0.92 on day of discharge  Acute respiratory failure with hypoxia  -Primarily related to acute septic shock  -  required short term intubation in ICU -extubated without complications -stable on room air Diarrhea/ buttock rash  - pt was incontinent and has a severe rash on buttocks  - improved significantly after lomotil x 1 on 8/16  - c diff negative  - no further incontinence  - rash improving as loose stools are improving  Atrial fibrillation with rapid ventricular response  -Cardiology consulted -was started Heparin and Coumadin, since INR now therapeutice Heparin has been discontinued on 8/18  -on Amiodarone and Coreg for rate control  -TSH normal  -follow up with her cardiologist in [place] -discharge with coumadin 3 mg daily -instructed pt to follow up with PCP and/or her cardiologist in 3-4 days to check INR Elevated troponin -Peak troponin 5.06 this admission  -Cards following-adding low dose BB (8/15)  -no indications for ischemic work at this time since etiology suspected to be demand ischemia  Right upper extremity edema/significant right hand and thumb pain  -involves same arm as HD cath- this was removed - swelling improved significantly but not resolved  -Venous duplex negative for DVT  -(8/15)developed subtle redness over right hand and thumb which is exquisitely tender to touch-  - Swelling completely resolved after 1 dose of Solumedrol and therfore was gout-will give few more days of steroids to ensure no recurrence. Now off prednisone  Anemia, unspecified  -baseline hgb unknown - was 12.7 at admit but this in setting of acute sepsis and Dehydration -FOB X 1 neg and anemia panel reveals AOCD with low iron levels  -Hb remains stable  Small Right Groin Hematoma  -site of  previous arterial line -monitor-for now-she is on anticoagulation  -appreciate vascular consult--f/u with Dr. Trula Slade in 4 wks in office--no further intervention at this time  -instructed pt to call EMS if increase in size or pain Disposition:  D/c home 8/21  DVT Prophylaxis:  Not needed as she is on  coumadin with therapeutic INR  Code Status:  Full code  Family Communication  Husband at bedside  Procedures:  L IJ CVL 8/9>>>  ETT 8/9>>>8/11  R IJ HD cath 8/9 >> 8/13  CONSULTS:  cardiology and pulmonary/intensive care Vascular surgery  Discharge Condition: stable  Disposition:  Follow-up Information   Follow up with Trula Slade IV, [place], MD In 4 weeks. (sent to office)    Specialty:  Vascular Surgery   Contact information:   [address] [place] [address] [phone]       Follow up with KRASOWSKI,ROBERT, MD In 1 week.   Specialty:  Cardiology   Contact information:   [address] [place] [address] [phone]       Follow up with [name], MD In 1 week.   Specialty:  Family Medicine   Contact information:   [place] [id number]       Diet:heart healthy Wt Readings from Last 3 Encounters:  06/15/13 91.944 kg (202 lb 11.2 oz)    History of present illness:  72 yo female with hx HTN, mult kidney stones, frequent UTI presented 8/8 to [facility] with fevers, chills, AMS. On 8/7 she underwent cystoscopy and attempted ureteral stent placement for R ureteral stricture. Per daughter, the stent fell out prior to her d/c from post procedure  Unit and pt/MD elected to leave it out, hoping that the "balloon" he used would help improve the stricture. She was d/c home 8/7 but developed worsening abd pain, n/v and was seen in urology office 8/8 am and had foley placed for urinary retention. She returned home and by that evening developed fevers, chills, nausea, AMS and was taken to [facility] where she was found to be in septic shock with acute renal failure. She required intubation for worsening resp distress and was tx to [facility].  Since admission this patient has been evaluated by urology who recommended placement of a percutaneous nephrostomy tube. This was accomplished in interventional radiology.  Because of her severe acute renal failure she did require short-term CVVHD. In the interim this patient has a history of paroxysmal atrial fibrillation developed atrial fibrillation with RVR as well as abnormal elevation in troponin. Cardiology was consulted. It was felt the patient likely had demand ischemia due to acute stress in recent septic shock. Currently IV amiodarone infusion is being utilized for the atrial fibrillation. Patient stabilized and pressor agents were discontinued. Patient required intubation for less than 72 hours. She stabilized enough to transfer out to the step down unit and step down team assumed care of this patient 06/08/2013, and was transferred to Telemetry on 8/18.      Discharge Exam: Filed Vitals:   06/16/13 0528  BP: 157/70  Pulse: 73  Temp: 98.3 F (36.8 C)  Resp: 18   Filed Vitals:   06/15/13 0404 06/15/13 1345 06/15/13 2100 06/16/13 0528  BP: 159/73 155/65 152/80 157/70  Pulse: 71 70 62 73  Temp: 99.1 F (37.3 C) 98.9 F (37.2 C) 98.9 F (37.2 C) 98.3 F (36.8 C)  TempSrc: Oral Oral Oral Oral  Resp: 18 18 18 18   Height:  Weight: 91.944 kg (202 lb 11.2 oz)     SpO2: 95% 98% 98% 98%   General: A&O x 3, NAD, pleasant, cooperative Cardiovascular: RRR, no rub, no gallop, no S3 Respiratory: CTAB, no wheeze, no rhonchi Abdomen:soft, nontender, nondistended, positive bowel sounds Extremities: trace LE edema, No lymphangitis, no petechiae  Discharge Instructions      Discharge Orders   Future Appointments Provider Department Dept Phone   07/18/2013 10:30 AM [facility] [phone]   07/18/2013 11:00 AM Serafina Mitchell, MD [facility] [phone]   Future Orders Complete By Expires   Diet - low sodium heart healthy  As directed    Discharge instructions  As directed    Comments:     Cephalexin 500mg  4 times a day until all pills are gone Call your family doctor  and heart doctor to follow up within one week   Increase activity slowly  As directed        Medication List    STOP taking these medications       aspirin EC 325 MG tablet     metoprolol tartrate 25 MG tablet  Commonly known as:  LOPRESSOR     sulfamethoxazole-trimethoprim 800-160 MG per tablet  Commonly known as:  BACTRIM DS      TAKE these medications       amiodarone 200 MG tablet  Commonly known as:  PACERONE  Take 1 tablet (200 mg total) by mouth 2 (two) times daily.     amitriptyline 25 MG tablet  Commonly known as:  ELAVIL  Take 25 mg by mouth at bedtime.     carvedilol 6.25 MG tablet  Commonly known as:  COREG  Take 1 tablet (6.25 mg total) by mouth 2 (two) times daily with a meal.     cephALEXin 500 MG capsule  Commonly known as:  KEFLEX  Take 1 capsule (500 mg total) by mouth 4 (four) times daily.     fenofibrate micronized 134 MG capsule  Commonly known as:  LOFIBRA  Take 134 mg by mouth daily before breakfast.     omeprazole 20 MG capsule  Commonly known as:  PRILOSEC  Take 20 mg by mouth daily.     ondansetron 4 MG tablet  Commonly known as:  ZOFRAN  Take 4 mg by mouth every 12 (twelve) hours as needed for nausea.     warfarin 3 MG tablet  Commonly known as:  COUMADIN  Take 1 tablet (3 mg total) by mouth daily.         The results of significant diagnostics from this hospitalization (including imaging, microbiology, ancillary and laboratory) are listed below for reference.    Significant Diagnostic Studies: Ir Nephrostomy Tube Change  06/09/2013   *RADIOLOGY REPORT*  Clinical data:  Right ureteral stricture with development of urosepsis after inadvertent   ureteral stent dislodgement, now status post percutaneous nephrostomy drainage x 5 days, clinically improved.  ANTEGRADE NEPHROSTOGRAM URETERAL STENT PLACEMENT EXCHANGE OF RIGHT NEPHROSTOMY CATHETER UNDER FLUOROSCOPY  Technique and findings: The procedure, risks (including but not limited to  bleeding, infection, organ damage), benefits, and alternatives were explained to the patient.  Questions regarding the procedure were encouraged and answered.  The patient understands and consents to the procedure.The patient placed prone. Right nephrostomy tube and surrounding skin prepped and draped usual sterile fashion. Maximal barrier sterile technique was utilized including caps, mask, sterile gowns, sterile gloves, sterile drape, hand hygiene and skin  antiseptic.  The patient was receiving adequate antibiotic prophylactic coverage.  Intravenous Fentanyl and Versed were administered as conscious sedation during continuous cardiorespiratory monitoring by the radiology RN, with a total moderate sedation time of 22 minutes.  A small contrast injection through the nephrostomy catheter was performed to opacify the renal collecting system.  The catheter was cut and exchanged over [facility] wire for a 5-French Kumpe catheter, advanced for antegrade nephrostogram.  The nephrostogram demonstrated moderate filling defects within the central right renal collecting system and proximal ureter consistent with retained clot.  There is some distention of the proximal ureter.  There is a short segment tortuous ureteral segment at approximately the L4 level but the catheter passed easily beyond the level as did the injected proximal contrast suggesting no   urodynamically significant stenosis here.  There is distention of the more distal ureter down to the level of the lower margin of the sacroiliac joint.  There is a long   tapered segment of the ureter extending several centimeters from this level to the level of the UVJ, with a small string of contrast seen with directed injection here.  The distal stricture was easily traversed and access to the lumen of the urinary bladder was achieved.  The Kumpe was exchanged for a 24 cm 8.5 French double-J ureteral stent, deployed over an Amplatz wire with the distal end in the urinary  bladder, proximal end in the right renal collecting system.  Because of residual thrombus in the right renal collecting system, an new 10-French percutaneous nephrostomy pigtail catheter was advanced and positioned centrally in the right renal collecting system.  The nephrostomy catheter was secured externally with O-Prolene suture and capped to allow internal drainage. The patient tolerated the procedure well.  No immediate complication.  IMPRESSION: 1.  Antegrade nephrostogram shows a short nondistended tortuous segment in the proximal ureter, and a long segment stricture in the distal ureter extending over several centimeters just proximal to the UVJ. 2.  Technically successful 24 cm 8.5 French double-J ureteral stent placement. 3.  Exchange of percutaneous nephrostomy catheter, capped to allow internal drainage.   Original Report Authenticated By: D. Wallace Going, MD   Ir Nephrostogram Right  06/09/2013   *RADIOLOGY REPORT*  Clinical data:  Right ureteral stricture with development of urosepsis after inadvertent   ureteral stent dislodgement, now status post percutaneous nephrostomy drainage x 5 days, clinically improved.  ANTEGRADE NEPHROSTOGRAM URETERAL STENT PLACEMENT EXCHANGE OF RIGHT NEPHROSTOMY CATHETER UNDER FLUOROSCOPY  Technique and findings: The procedure, risks (including but not limited to bleeding, infection, organ damage), benefits, and alternatives were explained to the patient.  Questions regarding the procedure were encouraged and answered.  The patient understands and consents to the procedure.The patient placed prone. Right nephrostomy tube and surrounding skin prepped and draped usual sterile fashion. Maximal barrier sterile technique was utilized including caps, mask, sterile gowns, sterile gloves, sterile drape, hand hygiene and skin antiseptic.  The patient was receiving adequate antibiotic prophylactic coverage.  Intravenous Fentanyl and Versed were administered as conscious sedation  during continuous cardiorespiratory monitoring by the radiology RN, with a total moderate sedation time of 22 minutes.  A small contrast injection through the nephrostomy catheter was performed to opacify the renal collecting system.  The catheter was cut and exchanged over [facility] wire for a 5-French Kumpe catheter, advanced for antegrade nephrostogram.  The nephrostogram demonstrated moderate filling defects within the central right renal collecting system and proximal ureter consistent with retained clot.  There is some  distention of the proximal ureter.  There is a short segment tortuous ureteral segment at approximately the L4 level but the catheter passed easily beyond the level as did the injected proximal contrast suggesting no   urodynamically significant stenosis here.  There is distention of the more distal ureter down to the level of the lower margin of the sacroiliac joint.  There is a long   tapered segment of the ureter extending several centimeters from this level to the level of the UVJ, with a small string of contrast seen with directed injection here.  The distal stricture was easily traversed and access to the lumen of the urinary bladder was achieved.  The Kumpe was exchanged for a 24 cm 8.5 French double-J ureteral stent, deployed over an Amplatz wire with the distal end in the urinary bladder, proximal end in the right renal collecting system.  Because of residual thrombus in the right renal collecting system, an new 10-French percutaneous nephrostomy pigtail catheter was advanced and positioned centrally in the right renal collecting system.  The nephrostomy catheter was secured externally with O-Prolene suture and capped to allow internal drainage. The patient tolerated the procedure well.  No immediate complication.  IMPRESSION: 1.  Antegrade nephrostogram shows a short nondistended tortuous segment in the proximal ureter, and a long segment stricture in the distal ureter extending over  several centimeters just proximal to the UVJ. 2.  Technically successful 24 cm 8.5 French double-J ureteral stent placement. 3.  Exchange of percutaneous nephrostomy catheter, capped to allow internal drainage.   Original Report Authenticated By: D. Wallace Going, MD   Ir Perc Nephrostomy Right  06/05/2013   *RADIOLOGY REPORT*   Clinical data: Clinical evidence of sepsis requiring intubation after attempted cystoscopic right renal decompression and stenting.  RIGHT PERCUTANEOUS NEPHROSTOMY CATHETER PLACEMENT UNDER ULTRASOUND AND FLUOROSCOPIC GUIDANCE  Technique: The procedure, risks (including but not limited to bleeding, infection, organ damage.  ), benefits, and alternatives were explained to the daughter, as the patient was intubated and unable to provide consent.  Questions regarding the procedure were encouraged and answered.  The daughter understands and consents to the procedure. The rightflank region prepped with Betadine, draped in usual sterile fashion, infiltrated locally with 1% lidocaine.The patient was already receiving adequate antibiotic coverage.  Intravenous Fentanyl and Versed were administered as conscious sedation during continuous cardiorespiratory monitoring by the radiology RN, with a total moderate sedation time of 20 minutes.   Under real-time ultrasound guidance, a 21-gauge trocar needle was advanced into a posterior lower pole calyx. Ultrasound image documentation was saved. Urine spontaneously returned through the needle. Needle was exchanged over a guidewire for transitional dilator. Contrast injection confirmed appropriate positioning. Catheter was exchanged over a guidewire for a 10 French pigtail catheter, formed centrally within the right renal collecting system. Contrast injection confirms appropriate positioning and patency. Catheter   secured externally with 0 Prolene suture and placed to external drain bag. A 20 ml aspirate of the cloudy urine returned through the catheter was  sent for Gram stain, culture and sensitivity.  No immediate complication.  Fluoroscopy time: 19seconds  IMPRESSION Technically successful right percutaneous nephrostomy catheter placement.   Original Report Authenticated By: D. Wallace Going, MD   Ir US Guide Bx Asp/drain  06/05/2013   *RADIOLOGY REPORT*   Clinical data: Clinical evidence of sepsis requiring intubation after attempted cystoscopic right renal decompression and stenting.  RIGHT PERCUTANEOUS NEPHROSTOMY CATHETER PLACEMENT UNDER ULTRASOUND AND FLUOROSCOPIC GUIDANCE  Technique: The procedure, risks (including but not  limited to bleeding, infection, organ damage.  ), benefits, and alternatives were explained to the daughter, as the patient was intubated and unable to provide consent.  Questions regarding the procedure were encouraged and answered.  The daughter understands and consents to the procedure. The rightflank region prepped with Betadine, draped in usual sterile fashion, infiltrated locally with 1% lidocaine.The patient was already receiving adequate antibiotic coverage.  Intravenous Fentanyl and Versed were administered as conscious sedation during continuous cardiorespiratory monitoring by the radiology RN, with a total moderate sedation time of 20 minutes.   Under real-time ultrasound guidance, a 21-gauge trocar needle was advanced into a posterior lower pole calyx. Ultrasound image documentation was saved. Urine spontaneously returned through the needle. Needle was exchanged over a guidewire for transitional dilator. Contrast injection confirmed appropriate positioning. Catheter was exchanged over a guidewire for a 10 French pigtail catheter, formed centrally within the right renal collecting system. Contrast injection confirms appropriate positioning and patency. Catheter   secured externally with 0 Prolene suture and placed to external drain bag. A 20 ml aspirate of the cloudy urine returned through the catheter was sent for Gram stain,  culture and sensitivity.  No immediate complication.  Fluoroscopy time: 19seconds  IMPRESSION Technically successful right percutaneous nephrostomy catheter placement.   Original Report Authenticated By: D. Wallace Going, MD   Dg Chest Port 1 View  06/09/2013   *RADIOLOGY REPORT*  Clinical Data: Cough, hypoxia  PORTABLE CHEST - 1 VIEW  Comparison: 06/07/2013  Findings: Cardiomediastinal silhouette is stable.  Central mild bronchitic changes.  Left IJ central line is stable in position. Right IJ catheter has been removed.  Persistent small left pleural effusion with left basilar atelectasis or infiltrate.  No pulmonary edema.  IMPRESSION: Central mild bronchitic changes.  Left IJ central line is stable in position.  Right IJ catheter has been removed.  Persistent small left pleural effusion with left basilar atelectasis or infiltrate. No pulmonary edema.   Original Report Authenticated By: Lahoma Crocker, M.D.   Dg Chest Port 1 View  06/07/2013   *RADIOLOGY REPORT*  Clinical Data: Short of breath  PORTABLE CHEST - 1 VIEW  Comparison: 06/06/2013  Findings: Endotracheal tube has been removed.  Right jugular catheter tip in the SVC.  Left jugular catheter tip in the SVC.  NG tube has been removed.  Negative for pneumothorax.  Left lower lobe atelectasis and effusion unchanged.  Right lung remains clear.  IMPRESSION: Endotracheal tube and NG tubes have been removed.  Left lower lobe atelectasis and effusion are unchanged.   Original Report Authenticated By: Carl Best, M.D.   Dg Chest Port 1 View  06/06/2013   *RADIOLOGY REPORT*  Clinical Data: Evaluate endotracheal tube placement and line placement.  PORTABLE CHEST - 1 VIEW  Comparison: 06/05/2013.  Findings: An endotracheal tube is in place with tip 4.7 cm above the carina. There is a left-sided internal jugular central venous catheter with tip terminating in the mid superior vena cava. There is a right-sided internal jugular central venous catheter with tip  terminating in the mid superior vena cava. A nasogastric tube is seen extending into the stomach, however, the tip of the nasogastric tube extends below the lower margin of the image.  Lung volumes are slightly low.  Persistent left basilar opacity may reflect atelectasis and/or consolidation in the left lower lobe with superimposed small left pleural effusion.  Mild cephalization of the pulmonary vasculature, without frank pulmonary edema. Subsegmental atelectasis in the right mid to upper lung.  Heart size is normal.  Atherosclerosis in the thoracic aorta. The patient is rotated to the left on today's exam, resulting in distortion of the mediastinal contours and reduced diagnostic sensitivity and specificity for mediastinal pathology.  IMPRESSION: 1.  Support apparatus, as above. 2.  Pulmonary venous congestion, without frank pulmonary edema. 3.  Atelectasis and/or consolidation in the left lower lobe with a superimposed small left pleural effusion.   Original Report Authenticated By: Vinnie Langton, M.D.   Dg Chest Port 1 View  06/05/2013   *RADIOLOGY REPORT*  Clinical Data: Respiratory failure  PORTABLE CHEST - 1 VIEW  Comparison: 06/04/2013  Findings: Endotracheal tube is approximately 6 cm above the carina. Right jugular catheter tip in the SVC.  Left jugular catheter tip in the mid SVC.  Negative for pneumothorax.  Left lower lobe atelectasis and effusion unchanged.  Negative for pulmonary edema.  IMPRESSION: No significant change.  Left lower lobe consolidation and left effusion are stable.   Original Report Authenticated By: Carl Best, M.D.   Dg Chest Port 1 View  06/04/2013   *RADIOLOGY REPORT*  Clinical Data: Sepsis, renal failure.  PORTABLE CHEST - 1 VIEW  Comparison: Earlier film of the same day  Findings: Endotracheal tube, nasogastric tube, and left IJ central line are stable in position.  New right IJ dialysis catheter extends to the proximal SVC.  No pneumothorax.  Persistent left lower lung  consolidation / atelectasis with adjacent effusion. Patchy perihilar interstitial infiltrates bilaterally as before.  IMPRESSION:  1.  Right IJ dialysis catheter placement to the proximal SVC, no pneumothorax.   Original Report Authenticated By: D. Wallace Going, MD   Dg Chest Port 1 View  06/04/2013   *RADIOLOGY REPORT*  Clinical Data: Endotracheal tube placement  PORTABLE CHEST - 1 VIEW  Comparison: 06/04/2013  Findings: Endotracheal tube is 2.5 cm above the carina.  Left jugular catheter tip in the SVC unchanged.  No pneumothorax.  NG tube enters the stomach.  Bibasilar atelectasis again noted.  Vascular congestion and mild edema appears improved.  IMPRESSION: Endotracheal tube remains in good position.  Bibasilar atelectasis unchanged.  Improvement in pulmonary edema.   Original Report Authenticated By: Carl Best, M.D.   [facility] Drone Cath Perc Right  06/09/2013   *RADIOLOGY REPORT*  Clinical data:  Right ureteral stricture with development of urosepsis after inadvertent   ureteral stent dislodgement, now status post percutaneous nephrostomy drainage x 5 days, clinically improved.  ANTEGRADE NEPHROSTOGRAM URETERAL STENT PLACEMENT EXCHANGE OF RIGHT NEPHROSTOMY CATHETER UNDER FLUOROSCOPY  Technique and findings: The procedure, risks (including but not limited to bleeding, infection, organ damage), benefits, and alternatives were explained to the patient.  Questions regarding the procedure were encouraged and answered.  The patient understands and consents to the procedure.The patient placed prone. Right nephrostomy tube and surrounding skin prepped and draped usual sterile fashion. Maximal barrier sterile technique was utilized including caps, mask, sterile gowns, sterile gloves, sterile drape, hand hygiene and skin antiseptic.  The patient was receiving adequate antibiotic prophylactic coverage.  Intravenous Fentanyl and Versed were administered as conscious sedation during continuous cardiorespiratory  monitoring by the radiology RN, with a total moderate sedation time of 22 minutes.  A small contrast injection through the nephrostomy catheter was performed to opacify the renal collecting system.  The catheter was cut and exchanged over [facility] wire for a 5-French Kumpe catheter, advanced for antegrade nephrostogram.  The nephrostogram demonstrated moderate filling defects within the central right renal collecting system and proximal ureter consistent  with retained clot.  There is some distention of the proximal ureter.  There is a short segment tortuous ureteral segment at approximately the L4 level but the catheter passed easily beyond the level as did the injected proximal contrast suggesting no   urodynamically significant stenosis here.  There is distention of the more distal ureter down to the level of the lower margin of the sacroiliac joint.  There is a long   tapered segment of the ureter extending several centimeters from this level to the level of the UVJ, with a small string of contrast seen with directed injection here.  The distal stricture was easily traversed and access to the lumen of the urinary bladder was achieved.  The Kumpe was exchanged for a 24 cm 8.5 French double-J ureteral stent, deployed over an Amplatz wire with the distal end in the urinary bladder, proximal end in the right renal collecting system.  Because of residual thrombus in the right renal collecting system, an new 10-French percutaneous nephrostomy pigtail catheter was advanced and positioned centrally in the right renal collecting system.  The nephrostomy catheter was secured externally with O-Prolene suture and capped to allow internal drainage. The patient tolerated the procedure well.  No immediate complication.  IMPRESSION: 1.  Antegrade nephrostogram shows a short nondistended tortuous segment in the proximal ureter, and a long segment stricture in the distal ureter extending over several centimeters just proximal to the  UVJ. 2.  Technically successful 24 cm 8.5 French double-J ureteral stent placement. 3.  Exchange of percutaneous nephrostomy catheter, capped to allow internal drainage.   Original Report Authenticated By: D. Wallace Going, MD     Microbiology: Recent Results (from the past 240 hour(s))  CLOSTRIDIUM DIFFICILE BY PCR     Status: None   Collection Time    06/09/13  9:45 AM      Result Value Range Status   C difficile by pcr NEGATIVE  NEGATIVE Final     Labs: Basic Metabolic Panel:  Recent Labs Lab 06/12/13 0530 06/13/13 0622 06/14/13 0602 06/15/13 0545 06/16/13 0400  NA 143 141 145 144 142  K 3.2* 3.6 3.4* 2.8* 3.4*  CL 106 106 110 107 107  CO2 27 28 27 26 25   GLUCOSE 94 92 88 86 86  BUN 16 15 15 13 11   CREATININE 1.23* 1.07 1.08 0.97 0.92  CALCIUM 8.3* 8.3* 8.5 8.8 8.5  MG 1.6 1.6 1.6 1.6 1.6  PHOS 3.2 3.0 3.3 3.7 3.4   Liver Function Tests:  Recent Labs Lab 06/12/13 0530 06/13/13 0622 06/14/13 0602 06/15/13 0545 06/16/13 0400  ALBUMIN 2.1* 2.1* 2.2* 2.6* 2.5*   No results found for this basename: LIPASE, AMYLASE,  in the last 168 hours No results found for this basename: AMMONIA,  in the last 168 hours CBC:  Recent Labs Lab 06/12/13 0530 06/13/13 0622 06/14/13 0602 06/15/13 0545 06/16/13 0400  WBC 11.1* 10.8* 9.8 10.3 10.3  NEUTROABS 7.2 6.6 5.9 6.1 6.1  HGB 9.2* 8.9* 9.0* 10.0* 9.8*  HCT 27.2* 27.1* 27.5* 30.3* 30.1*  MCV 78.2 79.9 80.2 79.9 80.7  PLT 456* 465* 541* 601* 587*   Cardiac Enzymes: No results found for this basename: CKTOTAL, CKMB, CKMBINDEX, TROPONINI,  in the last 168 hours BNP: No components found with this basename: POCBNP,  CBG:  Recent Labs Lab 06/10/13 0436 06/10/13 0747 06/10/13 1235 06/10/13 1614 06/11/13 0345  GLUCAP 93 81 114* 128* 148*    Time coordinating discharge:  Greater than 30 minutes  Signed:  Kailon Treese, DO [facility] Pager: [phone] 06/16/2013, 9:39 AM

## 2013-07-18 ENCOUNTER — Ambulatory Visit: Payer: Medicare Other | Admitting: Surgery

## 2013-07-19 ENCOUNTER — Other Ambulatory Visit: Payer: Self-pay | Admitting: Urology

## 2013-10-31 DIAGNOSIS — N390 Urinary tract infection, site not specified: Secondary | ICD-10-CM | POA: Diagnosis not present

## 2013-10-31 DIAGNOSIS — N200 Calculus of kidney: Secondary | ICD-10-CM | POA: Diagnosis not present

## 2013-10-31 DIAGNOSIS — N1330 Unspecified hydronephrosis: Secondary | ICD-10-CM | POA: Diagnosis not present

## 2013-11-02 DIAGNOSIS — N390 Urinary tract infection, site not specified: Secondary | ICD-10-CM | POA: Diagnosis not present

## 2013-11-11 DIAGNOSIS — N390 Urinary tract infection, site not specified: Secondary | ICD-10-CM | POA: Diagnosis not present

## 2013-11-11 DIAGNOSIS — N3000 Acute cystitis without hematuria: Secondary | ICD-10-CM | POA: Diagnosis not present

## 2013-11-11 DIAGNOSIS — N135 Crossing vessel and stricture of ureter without hydronephrosis: Secondary | ICD-10-CM | POA: Diagnosis not present

## 2013-11-11 DIAGNOSIS — R300 Dysuria: Secondary | ICD-10-CM | POA: Diagnosis not present

## 2013-11-15 ENCOUNTER — Other Ambulatory Visit: Payer: Self-pay | Admitting: Urology

## 2013-11-15 ENCOUNTER — Encounter (HOSPITAL_BASED_OUTPATIENT_CLINIC_OR_DEPARTMENT_OTHER): Payer: Self-pay | Admitting: *Deleted

## 2013-11-16 ENCOUNTER — Encounter (HOSPITAL_BASED_OUTPATIENT_CLINIC_OR_DEPARTMENT_OTHER): Payer: Self-pay | Admitting: *Deleted

## 2013-11-16 NOTE — Progress Notes (Signed)
NPO AFTER MN.  ARRIVE AT 0700. NEEDS ISTAT. CURRENT EKG, LOV NOTE, STRESS TEST TO FAXED FROM DR KRASOWSKI ( [facility], [place]). WILL TAKE COREG AND PRILOSEC AM DOS W/ SIPS OF WATER AND IF NEEDED TAKE HYDROCODONE.

## 2013-11-21 ENCOUNTER — Ambulatory Visit (HOSPITAL_BASED_OUTPATIENT_CLINIC_OR_DEPARTMENT_OTHER): Payer: Medicare Other | Admitting: Anesthesiology

## 2013-11-21 ENCOUNTER — Encounter (HOSPITAL_BASED_OUTPATIENT_CLINIC_OR_DEPARTMENT_OTHER): Payer: Self-pay | Admitting: *Deleted

## 2013-11-21 ENCOUNTER — Encounter (HOSPITAL_BASED_OUTPATIENT_CLINIC_OR_DEPARTMENT_OTHER)
Admission: RE | Disposition: A | Discharge status: Home / Self Care | Payer: Self-pay | Source: Ambulatory Visit | Attending: Urology

## 2013-11-21 ENCOUNTER — Encounter (HOSPITAL_BASED_OUTPATIENT_CLINIC_OR_DEPARTMENT_OTHER): Payer: Medicare Other | Admitting: Anesthesiology

## 2013-11-21 ENCOUNTER — Ambulatory Visit (HOSPITAL_BASED_OUTPATIENT_CLINIC_OR_DEPARTMENT_OTHER)
Admission: RE | Admit: 2013-11-21 | Discharge: 2013-11-21 | Disposition: A | Discharge status: Home / Self Care | Payer: Medicare Other | Source: Ambulatory Visit | Attending: Urology | Admitting: Urology

## 2013-11-21 DIAGNOSIS — N201 Calculus of ureter: Secondary | ICD-10-CM | POA: Diagnosis not present

## 2013-11-21 DIAGNOSIS — Z8541 Personal history of malignant neoplasm of cervix uteri: Secondary | ICD-10-CM | POA: Insufficient documentation

## 2013-11-21 DIAGNOSIS — N1330 Unspecified hydronephrosis: Secondary | ICD-10-CM | POA: Diagnosis not present

## 2013-11-21 DIAGNOSIS — N210 Calculus in bladder: Secondary | ICD-10-CM | POA: Diagnosis not present

## 2013-11-21 DIAGNOSIS — Z79899 Other long term (current) drug therapy: Secondary | ICD-10-CM | POA: Diagnosis not present

## 2013-11-21 DIAGNOSIS — N135 Crossing vessel and stricture of ureter without hydronephrosis: Secondary | ICD-10-CM | POA: Diagnosis not present

## 2013-11-21 DIAGNOSIS — I4891 Unspecified atrial fibrillation: Secondary | ICD-10-CM | POA: Diagnosis not present

## 2013-11-21 DIAGNOSIS — K219 Gastro-esophageal reflux disease without esophagitis: Secondary | ICD-10-CM | POA: Insufficient documentation

## 2013-11-21 DIAGNOSIS — R109 Unspecified abdominal pain: Secondary | ICD-10-CM | POA: Diagnosis not present

## 2013-11-21 DIAGNOSIS — I10 Essential (primary) hypertension: Secondary | ICD-10-CM | POA: Insufficient documentation

## 2013-11-21 DIAGNOSIS — E7800 Pure hypercholesterolemia, unspecified: Secondary | ICD-10-CM | POA: Insufficient documentation

## 2013-11-21 DIAGNOSIS — N200 Calculus of kidney: Secondary | ICD-10-CM | POA: Diagnosis not present

## 2013-11-21 DIAGNOSIS — N132 Hydronephrosis with renal and ureteral calculous obstruction: Secondary | ICD-10-CM

## 2013-11-21 HISTORY — DX: Personal history of urinary calculi: Z87.442

## 2013-11-21 HISTORY — DX: Essential (primary) hypertension: I10

## 2013-11-21 HISTORY — PX: CYSTOSCOPY WITH LITHOLAPAXY: SHX1425

## 2013-11-21 HISTORY — DX: Paroxysmal atrial fibrillation: I48.0

## 2013-11-21 HISTORY — DX: Personal history of malignant neoplasm of other parts of uterus: Z85.42

## 2013-11-21 HISTORY — DX: Presence of spectacles and contact lenses: Z97.3

## 2013-11-21 HISTORY — PX: URETEROSCOPY: SHX842

## 2013-11-21 HISTORY — DX: Other complications of anesthesia, initial encounter: T88.59XA

## 2013-11-21 HISTORY — DX: Personal history of other infectious and parasitic diseases: Z86.19

## 2013-11-21 LAB — POCT I-STAT 4, (NA,K, GLUC, HGB,HCT)
Glucose, Bld: 105 mg/dL — ABNORMAL HIGH (ref 70–99)
HCT: 37 % (ref 36.0–46.0)
Hemoglobin: 12.6 g/dL (ref 12.0–15.0)
Potassium: 3.7 mEq/L (ref 3.7–5.3)
Sodium: 142 mEq/L (ref 137–147)

## 2013-11-21 SURGERY — CYSTOSCOPY, WITH BLADDER CALCULUS LITHOLAPAXY
Anesthesia: Spinal | Site: Ureter | Laterality: Right

## 2013-11-21 MED ORDER — CEFAZOLIN SODIUM-DEXTROSE 2-3 GM-% IV SOLR
2.0000 g | INTRAVENOUS | Status: AC
  Administered 2013-11-21: 2 g via INTRAVENOUS
  Filled 2013-11-21: qty 50

## 2013-11-21 MED ORDER — FENTANYL CITRATE 0.05 MG/ML IJ SOLN
25.0000 ug | INTRAMUSCULAR | Status: DC | PRN
  Filled 2013-11-21: qty 1

## 2013-11-21 MED ORDER — KETOROLAC TROMETHAMINE 15 MG/ML IJ SOLN
INTRAMUSCULAR | Status: DC | PRN
  Administered 2013-11-21: 15 mg via INTRAVENOUS

## 2013-11-21 MED ORDER — ACETAMINOPHEN 10 MG/ML IV SOLN
INTRAVENOUS | Status: DC | PRN
  Administered 2013-11-21: 1000 mg via INTRAVENOUS

## 2013-11-21 MED ORDER — TRIMETHOPRIM 100 MG PO TABS: 100.0000 mg | ORAL_TABLET | ORAL | Status: DC

## 2013-11-21 MED ORDER — IOHEXOL 350 MG/ML SOLN
INTRAVENOUS | Status: DC | PRN
  Administered 2013-11-21: 17 mL

## 2013-11-21 MED ORDER — MEPERIDINE HCL 25 MG/ML IJ SOLN
6.2500 mg | INTRAMUSCULAR | Status: DC | PRN
  Filled 2013-11-21: qty 1

## 2013-11-21 MED ORDER — MIDAZOLAM HCL 5 MG/5ML IJ SOLN
INTRAMUSCULAR | Status: DC | PRN
  Administered 2013-11-21 (×2): 1 mg via INTRAVENOUS

## 2013-11-21 MED ORDER — SODIUM CHLORIDE 0.9 % IR SOLN
Status: DC | PRN
  Administered 2013-11-21: 500 mL via INTRAVESICAL

## 2013-11-21 MED ORDER — LACTATED RINGERS IV SOLN
INTRAVENOUS | Status: DC
  Administered 2013-11-21 (×2): via INTRAVENOUS
  Filled 2013-11-21: qty 1000

## 2013-11-21 MED ORDER — OXYCODONE-ACETAMINOPHEN 5-325 MG PO TABS
1.0000 | ORAL_TABLET | ORAL | Status: DC | PRN
Start: 2013-11-21 — End: 2014-03-15

## 2013-11-21 MED ORDER — SODIUM CHLORIDE 0.9 % IR SOLN
Status: DC | PRN
  Administered 2013-11-21: 5000 mL via INTRAVESICAL

## 2013-11-21 MED ORDER — MIDAZOLAM HCL 2 MG/2ML IJ SOLN
INTRAMUSCULAR | Status: AC
  Filled 2013-11-21: qty 2

## 2013-11-21 MED ORDER — METHYLENE BLUE 1 % INJ SOLN
INTRAMUSCULAR | Status: DC | PRN
  Administered 2013-11-21: 5 mL via INTRAVENOUS

## 2013-11-21 MED ORDER — FENTANYL CITRATE 0.05 MG/ML IJ SOLN
INTRAMUSCULAR | Status: DC | PRN
  Administered 2013-11-21 (×2): 50 ug via INTRAVENOUS

## 2013-11-21 MED ORDER — URIBEL 118 MG PO CAPS: 1.0000 | ORAL_CAPSULE | Freq: Two times a day (BID) | ORAL | Status: DC | PRN

## 2013-11-21 MED ORDER — FENTANYL CITRATE 0.05 MG/ML IJ SOLN
INTRAMUSCULAR | Status: AC
  Filled 2013-11-21: qty 2

## 2013-11-21 MED ORDER — PROPOFOL INFUSION 10 MG/ML OPTIME
INTRAVENOUS | Status: DC | PRN
  Administered 2013-11-21: 25 ug/kg/min via INTRAVENOUS

## 2013-11-21 MED ORDER — PROMETHAZINE HCL 25 MG/ML IJ SOLN
6.2500 mg | INTRAMUSCULAR | Status: DC | PRN
  Filled 2013-11-21: qty 1

## 2013-11-21 MED ORDER — BUPIVACAINE IN DEXTROSE 0.75-8.25 % IT SOLN
INTRATHECAL | Status: DC | PRN
  Administered 2013-11-21: 1.5 mL via INTRATHECAL

## 2013-11-21 SURGICAL SUPPLY — 41 items
ADAPTER CATH URET PLST 4-6FR (CATHETERS) IMPLANT
BAG DRAIN URO-CYSTO SKYTR STRL (DRAIN) ×4 IMPLANT
BASKET LASER NITINOL 1.9FR (BASKET) IMPLANT
BASKET STNLS GEMINI 4WIRE 3FR (BASKET) IMPLANT
BASKET ZERO TIP NITINOL 2.4FR (BASKET) IMPLANT
BOOTIES KNEE HIGH SLOAN (MISCELLANEOUS) ×4 IMPLANT
BRUSH URET BIOPSY 3F (UROLOGICAL SUPPLIES) IMPLANT
CANISTER SUCT LVC 12 LTR MEDI- (MISCELLANEOUS) ×4 IMPLANT
CATH CLEAR GEL 3F BACKSTOP (CATHETERS) IMPLANT
CATH INTERMIT  6FR 70CM (CATHETERS) IMPLANT
CATH URET 5FR 28IN CONE TIP (BALLOONS)
CATH URET 5FR 28IN OPEN ENDED (CATHETERS) ×4 IMPLANT
CATH URET 5FR 70CM CONE TIP (BALLOONS) IMPLANT
CATH URET DUAL LUMEN 6-10FR 50 (CATHETERS) IMPLANT
CLOTH BEACON ORANGE TIMEOUT ST (SAFETY) ×4 IMPLANT
DRAPE CAMERA CLOSED 9X96 (DRAPES) ×4 IMPLANT
ELECT REM PT RETURN 9FT ADLT (ELECTROSURGICAL)
ELECTRODE REM PT RTRN 9FT ADLT (ELECTROSURGICAL) IMPLANT
ELECTROHYDROLIC PROBE 9FR (MISCELLANEOUS) IMPLANT
FIBER LASER FLEXIVA 1000 (UROLOGICAL SUPPLIES) IMPLANT
FIBER LASER FLEXIVA 550 (UROLOGICAL SUPPLIES) IMPLANT
GLOVE BIO SURGEON STRL SZ7.5 (GLOVE) IMPLANT
GOWN PREVENTION PLUS LG XLONG (DISPOSABLE) IMPLANT
GOWN STRL REIN XL XLG (GOWN DISPOSABLE) IMPLANT
GOWN STRL REUS W/ TWL XL LVL3 (GOWN DISPOSABLE) ×3 IMPLANT
GOWN STRL REUS W/TWL XL LVL3 (GOWN DISPOSABLE) ×5 IMPLANT
GUIDEWIRE 0.038 PTFE COATED (WIRE) IMPLANT
GUIDEWIRE ANG ZIPWIRE 038X150 (WIRE) IMPLANT
GUIDEWIRE STR DUAL SENSOR (WIRE) ×4 IMPLANT
IV NS 1000ML (IV SOLUTION) ×5
IV NS 1000ML BAXH (IV SOLUTION) ×15 IMPLANT
IV NS IRRIG 3000ML ARTHROMATIC (IV SOLUTION) ×8 IMPLANT
KIT BALLIN UROMAX 15FX10 (LABEL) IMPLANT
KIT BALLN UROMAX 15FX4 (MISCELLANEOUS) IMPLANT
KIT BALLN UROMAX 26 75X4 (MISCELLANEOUS)
PACK CYSTOSCOPY (CUSTOM PROCEDURE TRAY) ×4 IMPLANT
SET HIGH PRES BAL DIL (LABEL)
SHEATH ACCESS URETERAL 38CM (SHEATH) IMPLANT
SHEATH ACCESS URETERAL 54CM (SHEATH) IMPLANT
STENT POLARIS LOOP 8FR X 24 CM (STENTS) ×4 IMPLANT
WATER STERILE IRR 3000ML UROMA (IV SOLUTION) IMPLANT

## 2013-11-21 NOTE — Anesthesia Preprocedure Evaluation (Addendum)
Anesthesia Evaluation  Patient identified by MRN, date of birth, ID band Patient awake    Reviewed: Allergy & Precautions, H&P , NPO status , Patient's Chart, lab work & pertinent test results  Airway Mallampati: II TM Distance: >3 FB Neck ROM: Full    Dental no notable dental hx.    Pulmonary neg pulmonary ROS,  breath sounds clear to auscultation  Pulmonary exam normal       Cardiovascular hypertension, Pt. on medications + Past MI negative cardio ROS  + dysrhythmias Atrial Fibrillation Rhythm:Regular Rate:Normal     Neuro/Psych negative neurological ROS  negative psych ROS   GI/Hepatic negative GI ROS, Neg liver ROS,   Endo/Other  negative endocrine ROS  Renal/GU Renal diseasenegative Renal ROS  negative genitourinary   Musculoskeletal negative musculoskeletal ROS (+)   Abdominal   Peds negative pediatric ROS (+)  Hematology negative hematology ROS (+)   Anesthesia Other Findings   Reproductive/Obstetrics negative OB ROS                        Anesthesia Physical Anesthesia Plan  ASA: II  Anesthesia Plan: Spinal   Post-op Pain Management:    Induction:   Airway Management Planned: Simple Face Mask  Additional Equipment:   Intra-op Plan:   Post-operative Plan:   Informed Consent: I have reviewed the patients History and Physical, chart, labs and discussed the procedure including the risks, benefits and alternatives for the proposed anesthesia with the patient or authorized representative who has indicated his/her understanding and acceptance.   Dental advisory given  Plan Discussed with: CRNA  Anesthesia Plan Comments:         Anesthesia Quick Evaluation

## 2013-11-21 NOTE — Interval H&P Note (Signed)
History and Physical Interval Note:  11/21/2013 8:42 AM  [name] [name]  has presented today for surgery, with the diagnosis of Bladder Stone  The various methods of treatment have been discussed with the patient and family. After consideration of risks, benefits and other options for treatment, the patient has consented to  Procedure(s): CYSTOSCOPY WITH LITHOLAPAXY (N/A) HOLMIUM LASER APPLICATION (N/A) URETEROSCOPY (N/A) as a surgical intervention .  The patient's history has been reviewed, patient examined, no change in status, stable for surgery.  I have reviewed the patient's chart and labs.  Questions were answered to the patient's satisfaction.     Carolan Clines I

## 2013-11-21 NOTE — H&P (Signed)
Reason For Visit 3 mo f/u & KUB   Active Problems Problems  1. Nephrolithiasis (592.0)   Assessed By: Jimmey Ralph (Urology); Last Assessed: 09 May 2008 2. Ureteral stricture (593.3)   Assessed By: Carolan Clines (Urology); Last Assessed: 11 Nov 2013  History of Present Illness    73 yo married female returns today for a 3 mo f/u & KUB. she has been on Macrobid for 7 days. C/s from Dr. Comer Locket, Rx macrobid. She is having burning.     Hx of ureteral stricture with a Rt JJ stent in place. She was discharged from the hospital on 06/16/13. She was admitted/transferred from [facility] on 06/04/13 for urosepsis & acute renal failure after a failed attempt of a Rt ureteral stent on 06/02/13 by Dr. Comer Locket. CT showed moderate to severe Rt hydronephrosis with Rt perinephric stranding & fluid, without evidence of a stone. She had a Rt percutaneous nephrostomy tube placed on 06/04/13. A Rt JJ stent was internalized on 06/09/13. She is tolerating the stent well.       She was previously seen in 2009 for hx of kidney stones. She has previously been treated in 04/2006 treated with lithotripsy. Last 24 hour urine was in 2006. Had Lasix renal scan 02/2006 which showed persistent yet improved low grade partial obstruction of the left renal collecting system. Split renal function is unchanged at approximately 56% from the right kidney and 44% from the left kidney. The left kidney has normal flow uptake and excretion and clearance of radiotracer.   Past Medical History Problems  1. History of Arthritis (V13.4) 2. History of Atrial fibrillation (427.31) 3. History of Calculus of ureter (592.1) 4. History of Cervical Cancer (V10.41) 5. History of Dysuria (788.1) 6. History of Flank Pain Left 7. History of acute renal failure (V13.09) 8. History of esophageal reflux (V12.79) 9. History of heartburn (V12.79) 10. History of hypercholesterolemia (V12.29) 11. History of hypertension (V12.59) 12.  History of Hydronephrosis, left (591) 13. History of Nephrolithiasis Of Both Kidneys (V13.01) 14. History of Sepsis (038.9,995.91)  Surgical History Problems  1. History of Back Surgery 2. History of Cholecystectomy 3. History of Cystourethroscopy 4. History of Hysterectomy 5. History of Kidney Surgery 6. History of Knee Surgery 7. History of Lithotripsy 8. History of Ostectomy Calcaneus 9. History of Ostectomy Calcaneus For Spur  Current Meds 1. Amiodarone HCl - 100 MG Oral Tablet;  Therapy: (Recorded:16Jan2015) to Recorded 2. Amitriptyline HCl - 25 MG Oral Tablet;  Therapy: (Recorded:15Jan2008) to Recorded 3. Aspirin 81 MG Oral Tablet;  Therapy: (Recorded:15Jan2008) to Recorded 4. Coreg 6.25 MG Oral Tablet;  Therapy: (Recorded:23Sep2014) to Recorded 5. Doxycycline Hyclate 100 MG Oral Capsule; Take 1 capsule twice daily;  Therapy: (Recorded:22Oct2014) to Recorded 6. Fenofibrate Micronized 134 MG Oral Capsule;  Therapy: (Recorded:23Sep2014) to Recorded 7. Macrodantin CAPS;  Therapy: (Recorded:16Jan2015) to Recorded 8. Omeprazole 20 MG Oral Capsule Delayed Release;  Therapy: (Recorded:23Sep2014) to Recorded 9. Phenazopyridine HCl - 200 MG Oral Tablet; TAKE 1 TABLET 3 TIMES DAILY AS NEEDED  FOR PAIN;  Therapy: 22Oct2014 to (Evaluate:29Dec2014)  Requested for: [id number]; Last  Rx:09Dec2014 Ordered  Allergies Medication  1. Codeine Derivatives 2. Flomax CP24 3. Macrodantin CAPS 4. Urocit-K 10 TBCR  Family History Problems  1. Family history of Bladder Cancer (V16.59) : Mother 2. Family history of Breast Cancer (V16.3) : Sister 3. Family history of Family Health Status Number Of Children   1 son, 1 daughter 4. Family history of Father Deceased At Age ____  92, chf 5. Family history of Heart Disease : Mother 6. Family history of Mother Deceased At Age ____   60, chf 7. Family history of Nephrolithiasis : Mother 54. Family history of Tuberculosis :  Father  Social History Problems  1. Denied: History of Alcohol Use 2. Caffeine Use   1 per day 3. Marital History - Currently Married 4. Never A Smoker 5. Occupation:   retired  Review of Systems Genitourinary, constitutional, skin, eye, otolaryngeal, hematologic/lymphatic, cardiovascular, pulmonary, endocrine, musculoskeletal, gastrointestinal, neurological and psychiatric system(s) were reviewed and pertinent findings if present are noted.  Genitourinary: urinary frequency, urinary urgency, dysuria, nocturia, hematuria and initiating urination requires straining, but urinary stream does not start and stop and no incomplete emptying of bladder.  Gastrointestinal: heartburn and diarrhea, but no nausea, no vomiting and no constipation.  Constitutional: fever, but no night sweats and not feeling tired (fatigue).  Integumentary: no new skin rashes or lesions and no pruritus.  Eyes: no blurred vision and no diplopia.  ENT: no sore throat and no sinus problems.  Hematologic/Lymphatic: a tendency to easily bruise, but no swollen glands.  Cardiovascular: no chest pain and no leg swelling.  Respiratory: cough, but no shortness of breath.  Endocrine: no polydipsia.  Musculoskeletal: no back pain and no joint pain.  Neurological: headache, but no dizziness.  Psychiatric: no depression and no anxiety.    Vitals Vital Signs [Data Includes: Last 1 Day]  Recorded: [id number] 03:49PM  Blood Pressure: 147 / 89 Temperature: 97.7 F Heart Rate: 83  Physical Exam Constitutional: Well nourished and well developed . No acute distress.  ENT:. The ears and nose are normal in appearance.  Neck: The appearance of the neck is normal and no neck mass is present.  Pulmonary: No respiratory distress and normal respiratory rhythm and effort.  Cardiovascular:. No peripheral edema. A. Fibrillation  Abdomen: The abdomen is soft and nontender. No masses are palpated. No CVA tenderness. No hernias are  palpable. No hepatosplenomegaly noted.  Lymphatics: The femoral and inguinal nodes are not enlarged or tender.  Skin: Normal skin turgor, no visible rash and no visible skin lesions.  Neuro/Psych:. Mood and affect are appropriate.    Results/Data  11 Nov 2013 2:23 PM  UA With REFLEX    COLOR AMBER     APPEARANCE CLOUDY     SPECIFIC GRAVITY 1.020     pH 6.0     GLUCOSE NEG     BILIRUBIN SMALL     KETONE NEG     BLOOD LARGE     PROTEIN 100     UROBILINOGEN 0.2     NITRITE NEG     LEUKOCYTE ESTERASE MOD     SQUAMOUS EPITHELIAL/HPF RARE     WBC TNTC     CRYSTALS NONE SEEN     CASTS NONE SEEN     RBC TNTC     BACTERIA MANY  Procedure KUB: JJ in good position, stone seen on the loop of the JJ.     Assessment Assessed  1. Dysuria (788.1) 2. Ureteral stricture (593.3) 3. Urinary tract infection (599.0)  73 yo female with hx of ureteral stricture ( R). She will need cystolitholopaxy, and JJ removal, and retrograde, and possible Polaris stent. She will need antibiotic coverage, determined by her culture today. She wants "saddle" spinal anesthetic, because she is afraid of "going to sleep".   Plan 1. cysto, litholopaxy, retrograde, JJ exchange wit Polaris stent. Spinal or saddle block. Progress. Continue Macrodantin.  Signatures Electronically signed by : Carolan Clines, M.D.; Nov 11 2013  4:12PM EST

## 2013-11-21 NOTE — Op Note (Signed)
Pre-operative diagnosis :  Right lower ureteral stricture and right ureteral stone  Postoperative diagnosis: Same  Operation:  Cystourethroscopy, L[place] right double-J catheter, right retrograde PolyGram with interpretation, right ureteroscopy, basket extraction of right ureteral stone, cystoscopy litholapaxy, insertion of 8 French by 24 cm Polaris stent in right ureter, injection of methylene blue.  Surgeon:  Chauncey Cruel. Gaynelle Arabian, MD  First assistant:  None  Anesthesia:  Spinal   Preparation:  After appropriate preanesthesia, the patient was brought the operative room, placed on the operating table in dorsal supine position where general LMA anesthesia was introduced. She was then replaced in the dorsal lithotomy position where the pubis was prepped with Betadine solution and draped in usual fashion. The arm band was double checked. The right arm had previously been marked. The history was double checked.  Review history:  Active Problems  Problems  1. Nephrolithiasis (592.0)   Assessed By: Jimmey Ralph (Urology); Last Assessed: 09 May 2008  2. Ureteral stricture (593.3)   Assessed By: Carolan Clines (Urology); Last Assessed: 11 Nov 2013  History of Present Illness  73 yo married female returns today for a 3 mo f/u & KUB. she has been on Macrobid for 7 days. C/s from Dr. Comer Locket, Rx macrobid. She is having burning.  Hx of ureteral stricture with a Rt JJ stent in place. She was discharged from the hospital on 06/16/13. She was admitted/transferred from [facility] on 06/04/13 for urosepsis & acute renal failure after a failed attempt of a Rt ureteral stent on 06/02/13 by Dr. Comer Locket. CT showed moderate to severe Rt hydronephrosis with Rt perinephric stranding & fluid, without evidence of a stone. She had a Rt percutaneous nephrostomy tube placed on 06/04/13. A Rt JJ stent was internalized on 06/09/13. She is tolerating the stent well.  She was previously seen in 2009 for hx of kidney stones.  She has previously been treated in 04/2006 treated with lithotripsy. Last 24 hour urine was in 2006. Had Lasix renal scan 02/2006 which showed persistent yet improved low grade partial obstruction of the left renal collecting system. Split renal function is unchanged at approximately 56% from the right kidney and 44% from the left kidney. The left kidney has normal flow uptake and excretion and clearance of radiotracer.      Statement of  Likelihood of Success: Excellent. TIME-OUT observed.:  Procedure:  Cystourethroscopy was accomplished, and right double-J catheter was identified was stone encrustation. The right double-J catheter was removed and cystoscopy litholapaxy was accomplished removing bladder stone material. The double-J catheter was cut in the lower third, and guidewire was passed into the upper ureter, but could not be passed through the upper portion of the double-J stent because of internal stone encrustation. Therefore, the guidewire and the double-J stent were removed. Cystoscopy was accomplished to remove clot from the bladder. The ureteral orifice was identified. However, could not pass a ureteral stent into the right ureter, because of severe stricturing of the right lower ureter. Therefore, the 6 French short ureteroscope was placed. Multiple false passages were identified. However, the right ureter was eventually identified. Methylene blue was given, with identification of the right ureter. Following (right ureter, ureteroscopy revealed severe tortuosity of the upper ureter. Basket was placed and stone material removed from the right ureter. I elected to place a large Polaris stent, and therefore, over the guidewire, an 8 [place] Polaris stent was placed, coiled in the renal pelvis, and with the bottom portion of the bladder. Photodocumentation was accomplished. The ureteral  stone material was irrigated free from the bladder. This was taken to laboratory for examination in the office. The  patient tolerated procedure well. Xylocaine gel was placed. She was taken to recovery room in good condition. No Foley was placed.

## 2013-11-21 NOTE — Transfer of Care (Signed)
Immediate Anesthesia Transfer of Care Note  Patient: [name] [name]  Procedure(s) Performed: Procedure(s): CYSTOSCOPY WITH LITHOLAPAXY (N/A) URETEROSCOPY WITH STENT REMOVAL AND REPLACEMENT (Right)  Patient Location: PACU  Anesthesia Type:Spinal  Level of Consciousness: awake, alert  and oriented  Airway & Oxygen Therapy: Patient Spontanous Breathing and Patient connected to nasal cannula oxygen  Post-op Assessment: Report given to PACU RN. Level L4  Post vital signs: Reviewed and stable  Complications: No apparent anesthesia complications

## 2013-11-21 NOTE — Anesthesia Procedure Notes (Signed)
Spinal  Patient location during procedure: OR Staffing Anesthesiologist: Montez Hageman Performed by: anesthesiologist  Preanesthetic Checklist Completed: patient identified, site marked, surgical consent, pre-op evaluation, timeout performed, IV checked, risks and benefits discussed and monitors and equipment checked Spinal Block Patient position: sitting Prep: Betadine Patient monitoring: heart rate, continuous pulse ox and blood pressure Approach: right paramedian Location: L2-3 Injection technique: single-shot Needle Needle type: Pencan  Needle gauge: 24 G Needle length: 9 cm Additional Notes Expiration date of kit checked and confirmed. Patient tolerated procedure well, without complications.

## 2013-11-21 NOTE — Discharge Instructions (Addendum)
Kidney Stones Kidney stones (urolithiasis) are deposits that form inside your kidneys. The intense pain is caused by the stone moving through the urinary tract. When the stone moves, the ureter goes into spasm around the stone. The stone is usually passed in the urine.  CAUSES   A disorder that makes certain neck glands produce too much parathyroid hormone (primary hyperparathyroidism).  A buildup of uric acid crystals, similar to gout in your joints.  Narrowing (stricture) of the ureter.  A kidney obstruction present at birth (congenital obstruction).  Previous surgery on the kidney or ureters.  Numerous kidney infections. SYMPTOMS   Feeling sick to your stomach (nauseous).  Throwing up (vomiting).  Blood in the urine (hematuria).  Pain that usually spreads (radiates) to the groin.  Frequency or urgency of urination. DIAGNOSIS   Taking a history and physical exam.  Blood or urine tests.  CT scan.  Occasionally, an examination of the inside of the urinary bladder (cystoscopy) is performed. TREATMENT   Observation.  Increasing your fluid intake.  Extracorporeal shock wave lithotripsy This is a noninvasive procedure that uses shock waves to break up kidney stones.  Surgery may be needed if you have severe pain or persistent obstruction. There are various surgical procedures. Most of the procedures are performed with the use of small instruments. Only small incisions are needed to accommodate these instruments, so recovery time is minimized. The size, location, and chemical composition are all important variables that will determine the proper choice of action for you. Talk to your health care provider to better understand your situation so that you will minimize the risk of injury to yourself and your kidney.  HOME CARE INSTRUCTIONS   Drink enough water and fluids to keep your urine clear or pale yellow. This will help you to pass the stone or stone fragments.  Strain  all urine through the provided strainer. Keep all particulate matter and stones for your health care provider to see. The stone causing the pain may be as small as a grain of salt. It is very important to use the strainer each and every time you pass your urine. The collection of your stone will allow your health care provider to analyze it and verify that a stone has actually passed. The stone analysis will often identify what you can do to reduce the incidence of recurrences.  Only take over-the-counter or prescription medicines for pain, discomfort, or fever as directed by your health care provider.  Make a follow-up appointment with your health care provider as directed.  Get follow-up X-rays if required. The absence of pain does not always mean that the stone has passed. It may have only stopped moving. If the urine remains completely obstructed, it can cause loss of kidney function or even complete destruction of the kidney. It is your responsibility to make sure X-rays and follow-ups are completed. Ultrasounds of the kidney can show blockages and the status of the kidney. Ultrasounds are not associated with any radiation and can be performed easily in a matter of minutes. SEEK MEDICAL CARE IF:  You experience pain that is progressive and unresponsive to any pain medicine you have been prescribed. SEEK IMMEDIATE MEDICAL CARE IF:   Pain cannot be controlled with the prescribed medicine.  You have a fever or shaking chills.  The severity or intensity of pain increases over 18 hours and is not relieved by pain medicine.  You develop a new onset of abdominal pain.  You feel faint or pass  out.  You are unable to urinate. MAKE SURE YOU:   Understand these instructions.  Will watch your condition.  Will get help right away if you are not doing well or get worse. Document Released: 10/13/2005 Document Revised: 06/15/2013 Document Reviewed: 03/16/2013 [facility] Patient Information 2014  [place].  Ureteral Colic (Kidney Stones) Ureteral colic is the result of a condition when kidney stones form inside the kidney. Once kidney stones are formed they may move into the tube that connects the kidney with the bladder (ureter). If this occurs, this condition may cause pain (colic) in the ureter.  CAUSES  Pain is caused by stone movement in the ureter and the obstruction caused by the stone. SYMPTOMS  The pain comes and goes as the ureter contracts around the stone. The pain is usually intense, sharp, and stabbing in character. The location of the pain may move as the stone moves through the ureter. When the stone is near the kidney the pain is usually located in the back and radiates to the belly (abdomen). When the stone is ready to pass into the bladder the pain is often located in the lower abdomen on the side the stone is located. At this location, the symptoms may mimic those of a urinary tract infection with urinary frequency. Once the stone is located here it often passes into the bladder and the pain disappears completely. TREATMENT   Your caregiver will provide you with medicine for pain relief.  You may require specialized follow-up X-rays.  The absence of pain does not always mean that the stone has passed. It may have just stopped moving. If the urine remains completely obstructed, it can cause loss of kidney function or even complete destruction of the involved kidney. It is your responsibility and in your interest that X-rays and follow-ups as suggested by your caregiver are completed. Relief of pain without passage of the stone can be associated with severe damage to the kidney, including loss of kidney function on that side.  If your stone does not pass on its own, additional measures may be taken by your caregiver to ensure its removal. HOME CARE INSTRUCTIONS   Increase your fluid intake. Water is the preferred fluid since juices containing vitamin C may acidify  the urine making it less likely for certain stones (uric acid stones) to pass.  Strain all urine. A strainer will be provided. Keep all particulate matter or stones for your caregiver to inspect.  Take your pain medicine as directed.  Make a follow-up appointment with your caregiver as directed.  Remember that the goal is passage of your stone. The absence of pain does not mean the stone is gone. Follow your caregiver's instructions.  Only take over-the-counter or prescription medicines for pain, discomfort, or fever as directed by your caregiver. SEEK MEDICAL CARE IF:   Pain cannot be controlled with the prescribed medicine.  You have a fever.  Pain continues for longer than your caregiver advises it should.  There is a change in the pain, and you develop chest discomfort or constant abdominal pain.  You feel faint or pass out. MAKE SURE YOU:   Understand these instructions.  Will watch your condition.  Will get help right away if you are not doing well or get worse. Document Released: 07/23/2005 Document Revised: 02/07/2013 Document Reviewed: 04/09/2011 [facility] Patient Information 2014 [place], [place].  Post Anesthesia Home Care Instructions  Activity: Get plenty of rest for the remainder of the day. A responsible adult should  stay with you for 24 hours following the procedure.  For the next 24 hours, DO NOT: -Drive a car -Paediatric nurse -Drink alcoholic beverages -Take any medication unless instructed by your physician -Make any legal decisions or sign important papers.  Meals: Start with liquid foods such as gelatin or soup. Progress to regular foods as tolerated. Avoid greasy, spicy, heavy foods. If nausea and/or vomiting occur, drink only clear liquids until the nausea and/or vomiting subsides. Call your physician if vomiting continues.  Special Instructions/Symptoms: Your throat may feel dry or sore from the anesthesia or the breathing tube placed in your  throat during surgery. If this causes discomfort, gargle with warm salt water. The discomfort should disappear within 24 hours. [facility] [phone] Post Ureteroscopy With or Without Stent Instructions  Definitions:  Ureter: The duct that transports urine from the kidney to the bladder. Stent:   A plastic hollow tube that is placed into the ureter, from the kidney to the                 bladder to prevent the ureter from swelling shut.  GENERAL INSTRUCTIONS:  Despite the fact that no skin incisions were used, the area around the ureter and bladder is raw and irritated. The stent is a foreign body which will further irritate the bladder wall. This irritation is manifested by increased frequency of urination, both day and night, and by an increase in the urge to urinate. In some, the urge to urinate is present almost always. Sometimes the urge is strong enough that you may not be able to stop yourself from urinating. The only real cure is to remove the stent and then give time for the bladder wall to heal which can't be done until the danger of the ureter swelling shut has passed, which varies.  You may see some blood in your urine while the stent is in place and a few days afterwards. Do not be alarmed, even if the urine was clear for a while. Get off your feet and drink lots of fluids until clearing occurs. If you start to pass clots or don't improve, call us.  DIET: You may return to your normal diet immediately. Because of the raw surface of your bladder, alcohol, spicy foods, acid type foods and drinks with caffeine may cause irritation or frequency and should be used in moderation. To keep your urine flowing freely and to avoid constipation, drink plenty of fluids during the day ( 8-10 glasses ). Tip: Avoid cranberry juice because it is very acidic.  ACTIVITY: Your physical activity doesn't need to be restricted. However, if you are very active, you may see some blood in  your urine. We suggest that you reduce your activity under these circumstances until the bleeding has stopped.  BOWELS: It is important to keep your bowels regular during the postoperative period. Straining with bowel movements can cause bleeding. A bowel movement every other day is reasonable. Use a mild laxative if needed, such as Milk of Magnesia 2-3 tablespoons, or 2 Dulcolax tablets. Call if you continue to have problems. If you have been taking narcotics for pain, before, during or after your surgery, you may be constipated. Take a laxative if necessary.   MEDICATION: You should resume your pre-surgery medications unless told not to. In addition you will often be given an antibiotic to prevent infection. These should be taken as prescribed until the bottles are finished unless you are having an unusual reaction to one of  the drugs.  PROBLEMS YOU SHOULD REPORT TO [place]:  Fevers over 100.5 Fahrenheit.  Heavy bleeding, or clots ( See above notes about blood in urine ).  Inability to urinate.  Drug reactions ( hives, rash, nausea, vomiting, diarrhea ).  Severe burning or pain with urination that is not improving.  FOLLOW-UP: You will need a follow-up appointment to monitor your progress. Call for this appointment at the number listed above. Usually the first appointment will be about three to fourteen days after your surgery.

## 2013-11-21 NOTE — Anesthesia Postprocedure Evaluation (Signed)
  Anesthesia Post-op Note  Patient: [name] [name]  Procedure(s) Performed: Procedure(s) (LRB): CYSTOSCOPY WITH LITHOLAPAXY (N/A) URETEROSCOPY WITH STENT REMOVAL AND REPLACEMENT (Right)  Patient Location: PACU  Anesthesia Type: Spinal  Level of Consciousness: awake and alert   Airway and Oxygen Therapy: Patient Spontanous Breathing  Post-op Pain: mild  Post-op Assessment: Post-op Vital signs reviewed, Patient's Cardiovascular Status Stable, Respiratory Function Stable, Patent Airway and No signs of Nausea or vomiting  Last Vitals:  Filed Vitals:   11/21/13 1028  BP:   Pulse: 56  Temp:   Resp: 18    Post-op Vital Signs: stable   Complications: No apparent anesthesia complications

## 2013-11-22 ENCOUNTER — Encounter (HOSPITAL_BASED_OUTPATIENT_CLINIC_OR_DEPARTMENT_OTHER): Payer: Self-pay | Admitting: Urology

## 2013-11-22 DIAGNOSIS — N200 Calculus of kidney: Secondary | ICD-10-CM | POA: Diagnosis not present

## 2013-11-29 DIAGNOSIS — R3129 Other microscopic hematuria: Secondary | ICD-10-CM | POA: Diagnosis not present

## 2013-11-29 DIAGNOSIS — N135 Crossing vessel and stricture of ureter without hydronephrosis: Secondary | ICD-10-CM | POA: Diagnosis not present

## 2013-12-16 DIAGNOSIS — N819 Female genital prolapse, unspecified: Secondary | ICD-10-CM | POA: Diagnosis not present

## 2013-12-19 DIAGNOSIS — Z79899 Other long term (current) drug therapy: Secondary | ICD-10-CM | POA: Diagnosis not present

## 2013-12-19 DIAGNOSIS — E785 Hyperlipidemia, unspecified: Secondary | ICD-10-CM | POA: Diagnosis not present

## 2013-12-19 DIAGNOSIS — R002 Palpitations: Secondary | ICD-10-CM | POA: Diagnosis not present

## 2013-12-19 DIAGNOSIS — I4891 Unspecified atrial fibrillation: Secondary | ICD-10-CM | POA: Diagnosis not present

## 2013-12-27 DIAGNOSIS — N390 Urinary tract infection, site not specified: Secondary | ICD-10-CM | POA: Diagnosis not present

## 2013-12-29 DIAGNOSIS — N390 Urinary tract infection, site not specified: Secondary | ICD-10-CM | POA: Diagnosis not present

## 2014-01-10 DIAGNOSIS — N1330 Unspecified hydronephrosis: Secondary | ICD-10-CM | POA: Diagnosis not present

## 2014-01-10 DIAGNOSIS — R3129 Other microscopic hematuria: Secondary | ICD-10-CM | POA: Diagnosis not present

## 2014-01-10 DIAGNOSIS — N390 Urinary tract infection, site not specified: Secondary | ICD-10-CM | POA: Diagnosis not present

## 2014-01-10 DIAGNOSIS — R300 Dysuria: Secondary | ICD-10-CM | POA: Diagnosis not present

## 2014-01-12 DIAGNOSIS — N390 Urinary tract infection, site not specified: Secondary | ICD-10-CM | POA: Diagnosis not present

## 2014-01-17 DIAGNOSIS — R300 Dysuria: Secondary | ICD-10-CM | POA: Diagnosis not present

## 2014-01-17 DIAGNOSIS — N135 Crossing vessel and stricture of ureter without hydronephrosis: Secondary | ICD-10-CM | POA: Diagnosis not present

## 2014-01-17 DIAGNOSIS — N200 Calculus of kidney: Secondary | ICD-10-CM | POA: Diagnosis not present

## 2014-01-17 DIAGNOSIS — N3000 Acute cystitis without hematuria: Secondary | ICD-10-CM | POA: Diagnosis not present

## 2014-01-19 ENCOUNTER — Other Ambulatory Visit: Payer: Self-pay | Admitting: Urology

## 2014-01-20 DIAGNOSIS — Z1231 Encounter for screening mammogram for malignant neoplasm of breast: Secondary | ICD-10-CM | POA: Diagnosis not present

## 2014-01-30 ENCOUNTER — Encounter (HOSPITAL_BASED_OUTPATIENT_CLINIC_OR_DEPARTMENT_OTHER): Payer: Self-pay | Admitting: *Deleted

## 2014-01-30 NOTE — Progress Notes (Signed)
NPO AFTER MN. ARRIVE AT 0800. NEEDS ISTAT 8.  CURRENT EKG IN CHART. WILL TAKE COREG AND PRILOSEC AM DOS W/ SIPS OF WATER.

## 2014-02-06 ENCOUNTER — Encounter (HOSPITAL_BASED_OUTPATIENT_CLINIC_OR_DEPARTMENT_OTHER): Payer: Medicare Other | Admitting: Anesthesiology

## 2014-02-06 ENCOUNTER — Encounter (HOSPITAL_BASED_OUTPATIENT_CLINIC_OR_DEPARTMENT_OTHER): Payer: Self-pay

## 2014-02-06 ENCOUNTER — Ambulatory Visit (HOSPITAL_BASED_OUTPATIENT_CLINIC_OR_DEPARTMENT_OTHER)
Admission: RE | Admit: 2014-02-06 | Discharge: 2014-02-06 | Disposition: A | Discharge status: Home / Self Care | Payer: Medicare Other | Source: Ambulatory Visit | Attending: Urology | Admitting: Urology

## 2014-02-06 ENCOUNTER — Ambulatory Visit (HOSPITAL_BASED_OUTPATIENT_CLINIC_OR_DEPARTMENT_OTHER): Payer: Medicare Other | Admitting: Anesthesiology

## 2014-02-06 ENCOUNTER — Encounter (HOSPITAL_BASED_OUTPATIENT_CLINIC_OR_DEPARTMENT_OTHER)
Admission: RE | Disposition: A | Discharge status: Home / Self Care | Payer: Self-pay | Source: Ambulatory Visit | Attending: Urology

## 2014-02-06 DIAGNOSIS — Z79899 Other long term (current) drug therapy: Secondary | ICD-10-CM | POA: Insufficient documentation

## 2014-02-06 DIAGNOSIS — Z9089 Acquired absence of other organs: Secondary | ICD-10-CM | POA: Insufficient documentation

## 2014-02-06 DIAGNOSIS — I252 Old myocardial infarction: Secondary | ICD-10-CM | POA: Insufficient documentation

## 2014-02-06 DIAGNOSIS — Z8052 Family history of malignant neoplasm of bladder: Secondary | ICD-10-CM | POA: Insufficient documentation

## 2014-02-06 DIAGNOSIS — I10 Essential (primary) hypertension: Secondary | ICD-10-CM | POA: Insufficient documentation

## 2014-02-06 DIAGNOSIS — E7800 Pure hypercholesterolemia, unspecified: Secondary | ICD-10-CM | POA: Diagnosis not present

## 2014-02-06 DIAGNOSIS — D649 Anemia, unspecified: Secondary | ICD-10-CM | POA: Insufficient documentation

## 2014-02-06 DIAGNOSIS — Z7982 Long term (current) use of aspirin: Secondary | ICD-10-CM | POA: Insufficient documentation

## 2014-02-06 DIAGNOSIS — Z8541 Personal history of malignant neoplasm of cervix uteri: Secondary | ICD-10-CM | POA: Insufficient documentation

## 2014-02-06 DIAGNOSIS — N135 Crossing vessel and stricture of ureter without hydronephrosis: Secondary | ICD-10-CM | POA: Diagnosis not present

## 2014-02-06 DIAGNOSIS — K219 Gastro-esophageal reflux disease without esophagitis: Secondary | ICD-10-CM | POA: Diagnosis not present

## 2014-02-06 DIAGNOSIS — N390 Urinary tract infection, site not specified: Secondary | ICD-10-CM | POA: Diagnosis not present

## 2014-02-06 DIAGNOSIS — N200 Calculus of kidney: Secondary | ICD-10-CM | POA: Diagnosis not present

## 2014-02-06 DIAGNOSIS — I4891 Unspecified atrial fibrillation: Secondary | ICD-10-CM | POA: Diagnosis not present

## 2014-02-06 DIAGNOSIS — Z90710 Acquired absence of both cervix and uterus: Secondary | ICD-10-CM | POA: Diagnosis not present

## 2014-02-06 DIAGNOSIS — Z466 Encounter for fitting and adjustment of urinary device: Secondary | ICD-10-CM | POA: Insufficient documentation

## 2014-02-06 DIAGNOSIS — N35919 Unspecified urethral stricture, male, unspecified site: Secondary | ICD-10-CM | POA: Diagnosis not present

## 2014-02-06 HISTORY — PX: CYSTOSCOPY W/ URETERAL STENT PLACEMENT: SHX1429

## 2014-02-06 HISTORY — DX: Crossing vessel and stricture of ureter without hydronephrosis: N13.5

## 2014-02-06 LAB — POCT I-STAT, CHEM 8
BUN: 24 mg/dL — ABNORMAL HIGH (ref 6–23)
Calcium, Ion: 1.12 mmol/L — ABNORMAL LOW (ref 1.13–1.30)
Chloride: 107 mEq/L (ref 96–112)
Creatinine, Ser: 1.2 mg/dL — ABNORMAL HIGH (ref 0.50–1.10)
Glucose, Bld: 104 mg/dL — ABNORMAL HIGH (ref 70–99)
HCT: 40 % (ref 36.0–46.0)
Hemoglobin: 13.6 g/dL (ref 12.0–15.0)
Potassium: 4.1 mEq/L (ref 3.7–5.3)
Sodium: 140 mEq/L (ref 137–147)
TCO2: 22 mmol/L (ref 0–100)

## 2014-02-06 SURGERY — CYSTOSCOPY, WITH RETROGRADE PYELOGRAM AND URETERAL STENT INSERTION
Anesthesia: General | Site: Ureter | Laterality: Right

## 2014-02-06 MED ORDER — GENTAMICIN SULFATE 40 MG/ML IJ SOLN
120.0000 mg | INTRAVENOUS | Status: DC
  Filled 2014-02-06: qty 3

## 2014-02-06 MED ORDER — STERILE WATER FOR IRRIGATION IR SOLN
Status: DC | PRN
  Administered 2014-02-06: 3000 mL

## 2014-02-06 MED ORDER — PHENAZOPYRIDINE HCL 200 MG PO TABS: 200.0000 mg | ORAL_TABLET | Freq: Three times a day (TID) | ORAL | Status: DC | PRN

## 2014-02-06 MED ORDER — FENTANYL CITRATE 0.05 MG/ML IJ SOLN
INTRAMUSCULAR | Status: DC | PRN
  Administered 2014-02-06 (×2): 25 ug via INTRAVENOUS
  Administered 2014-02-06: 50 ug via INTRAVENOUS

## 2014-02-06 MED ORDER — LIDOCAINE HCL (CARDIAC) 20 MG/ML IV SOLN
INTRAVENOUS | Status: DC | PRN
  Administered 2014-02-06: 60 mg via INTRAVENOUS

## 2014-02-06 MED ORDER — IOHEXOL 350 MG/ML SOLN
INTRAVENOUS | Status: DC | PRN
  Administered 2014-02-06: 25 mL via INTRAVENOUS

## 2014-02-06 MED ORDER — BELLADONNA ALKALOIDS-OPIUM 16.2-60 MG RE SUPP
RECTAL | Status: AC
  Filled 2014-02-06: qty 1

## 2014-02-06 MED ORDER — FENTANYL CITRATE 0.05 MG/ML IJ SOLN
INTRAMUSCULAR | Status: AC
  Filled 2014-02-06: qty 4

## 2014-02-06 MED ORDER — PROPOFOL 10 MG/ML IV BOLUS
INTRAVENOUS | Status: DC | PRN
  Administered 2014-02-06: 150 mg via INTRAVENOUS

## 2014-02-06 MED ORDER — SODIUM CHLORIDE 0.9 % IR SOLN
Status: DC | PRN
  Administered 2014-02-06: 1000 mL

## 2014-02-06 MED ORDER — LACTATED RINGERS IV SOLN
INTRAVENOUS | Status: DC
  Administered 2014-02-06: 09:00:00 via INTRAVENOUS
  Filled 2014-02-06: qty 1000

## 2014-02-06 MED ORDER — ACETAMINOPHEN 10 MG/ML IV SOLN
INTRAVENOUS | Status: DC | PRN
  Administered 2014-02-06: 1000 mg via INTRAVENOUS

## 2014-02-06 MED ORDER — DEXAMETHASONE SODIUM PHOSPHATE 4 MG/ML IJ SOLN
INTRAMUSCULAR | Status: DC | PRN
  Administered 2014-02-06: 4 mg via INTRAVENOUS

## 2014-02-06 MED ORDER — ONDANSETRON HCL 4 MG/2ML IJ SOLN
INTRAMUSCULAR | Status: DC | PRN
  Administered 2014-02-06: 4 mg via INTRAVENOUS

## 2014-02-06 MED ORDER — MIDAZOLAM HCL 2 MG/2ML IJ SOLN
INTRAMUSCULAR | Status: AC
  Filled 2014-02-06: qty 2

## 2014-02-06 MED ORDER — GENTAMICIN SULFATE 40 MG/ML IJ SOLN
400.0000 mg | INTRAVENOUS | Status: AC
  Administered 2014-02-06: 400 mg via INTRAVENOUS
  Filled 2014-02-06: qty 10

## 2014-02-06 MED ORDER — KETOROLAC TROMETHAMINE 30 MG/ML IJ SOLN
INTRAMUSCULAR | Status: DC | PRN
  Administered 2014-02-06: 15 mg via INTRAVENOUS

## 2014-02-06 MED ORDER — TRAMADOL-ACETAMINOPHEN 37.5-325 MG PO TABS: 1.0000 | ORAL_TABLET | Freq: Four times a day (QID) | ORAL | Status: DC | PRN

## 2014-02-06 SURGICAL SUPPLY — 21 items
5 X 22 POLARIS STENT ×2 IMPLANT
ADAPTER CATH URET PLST 4-6FR (CATHETERS) ×2 IMPLANT
BAG DRAIN URO-CYSTO SKYTR STRL (DRAIN) ×2 IMPLANT
BOOTIES KNEE HIGH SLOAN (MISCELLANEOUS) ×2 IMPLANT
CANISTER SUCT LVC 12 LTR MEDI- (MISCELLANEOUS) ×2 IMPLANT
CATH INTERMIT  6FR 70CM (CATHETERS) ×2 IMPLANT
CATH URET DUAL LUMEN 6-10FR 50 (CATHETERS) IMPLANT
CLOTH BEACON ORANGE TIMEOUT ST (SAFETY) ×2 IMPLANT
DRAPE CAMERA CLOSED 9X96 (DRAPES) ×2 IMPLANT
GLOVE BIO SURGEON STRL SZ7.5 (GLOVE) ×2 IMPLANT
GLOVE SURG SS PI 7.5 STRL IVOR (GLOVE) ×4 IMPLANT
GOWN STRL REUS W/TWL XL LVL3 (GOWN DISPOSABLE) ×4 IMPLANT
GUIDEWIRE 0.038 PTFE COATED (WIRE) IMPLANT
GUIDEWIRE ANG ZIPWIRE 038X150 (WIRE) IMPLANT
GUIDEWIRE STR DUAL SENSOR (WIRE) ×2 IMPLANT
IV NS 1000ML (IV SOLUTION) ×1
IV NS 1000ML BAXH (IV SOLUTION) ×1 IMPLANT
NS IRRIG 500ML POUR BTL (IV SOLUTION) IMPLANT
PACK CYSTOSCOPY (CUSTOM PROCEDURE TRAY) ×2 IMPLANT
STENT POLARIS 5FRX22 (STENTS) ×2 IMPLANT
WATER STERILE IRR 3000ML UROMA (IV SOLUTION) ×2 IMPLANT

## 2014-02-06 NOTE — Anesthesia Procedure Notes (Signed)
Procedure Name: LMA Insertion Date/Time: 02/06/2014 9:58 AM Performed by: Denna Haggard D Pre-anesthesia Checklist: Patient identified, Emergency Drugs available, Suction available and Patient being monitored Patient Re-evaluated:Patient Re-evaluated prior to inductionOxygen Delivery Method: Circle System Utilized Preoxygenation: Pre-oxygenation with 100% oxygen Intubation Type: IV induction Ventilation: Mask ventilation without difficulty LMA: LMA inserted LMA Size: 4.0 Number of attempts: 1 Airway Equipment and Method: bite block Placement Confirmation: positive ETCO2 Tube secured with: Tape Dental Injury: Teeth and Oropharynx as per pre-operative assessment

## 2014-02-06 NOTE — Discharge Instructions (Addendum)
CYSTOSCOPY HOME CARE INSTRUCTIONS  Activity: Rest for the remainder of the day.  Do not drive or operate equipment today.  You may resume normal activities in one to two days as instructed by your physician.   Meals: Drink plenty of liquids and eat light foods such as gelatin or soup this evening.  You may return to a normal meal plan tomorrow.  Return to Work: You may return to work in one to two days or as instructed by your physician.  Special Instructions / Symptoms: Call your physician if any of these symptoms occur:   -persistent or heavy bleeding  -bleeding which continues after first few urination  -large blood clots that are difficult to pass  -urine stream diminishes or stops completely  -fever equal to or higher than 101 degrees Farenheit.  -cloudy urine with a strong, foul odor  -severe pain  Females should always wipe from front to back after elimination.  You may feel some burning pain when you urinate.  This should disappear with time.  Applying moist heat to the lower abdomen or a hot tub bath may help relieve the pain. \  Follow-Up / Date of Return Visit to Your Physician:  *** Call for an appointment to arrange follow-up.  Patient Signature:  ________________________________________________________  Nurse's Signature:  ________________________________________________________   Post Anesthesia Home Care Instructions  Activity: Get plenty of rest for the remainder of the day. A responsible adult should stay with you for 24 hours following the procedure.  For the next 24 hours, DO NOT: -Drive a car -Paediatric nurse -Drink alcoholic beverages -Take any medication unless instructed by your physician -Make any legal decisions or sign important papers.  Meals: Start with liquid foods such as gelatin or soup. Progress to regular foods as tolerated. Avoid greasy, spicy, heavy foods. If nausea and/or vomiting occur, drink only clear liquids until the nausea  and/or vomiting subsides. Call your physician if vomiting continues.  Special Instructions/Symptoms: Your throat may feel dry or sore from the anesthesia or the breathing tube placed in your throat during surgery. If this causes discomfort, gargle with warm salt water. The discomfort should disappear within 24 hours.

## 2014-02-06 NOTE — Interval H&P Note (Signed)
History and Physical Interval Note:  02/06/2014 9:03 AM  [name] [name]  has presented today for surgery, with the diagnosis of right ureteral stricture   The various methods of treatment have been discussed with the patient and family. After consideration of risks, benefits and other options for treatment, the patient has consented to  Procedure(s): CYSTOSCOPY WITH RIGHT RETROGRADE PYELOGRAM RIGHT DOUBLE J STENT PLACEMENT (Right) as a surgical intervention .  The patient's history has been reviewed, patient examined, no change in status, stable for surgery.  I have reviewed the patient's chart and labs.  Questions were answered to the patient's satisfaction.     Ailene Rud

## 2014-02-06 NOTE — H&P (Signed)
Reason For Visit 6 week f/u & KUB   Active Problems Problems  1. Nephrolithiasis (592.0)   Assessed By: Carolan Clines (Urology); Last Assessed: 17 Jan 2014  History of Present Illness    73 YO female returns today for a 6 week f/u & KUB for possible removal of Rt JJ stent vs or exchange every 6 mo. She is s/p cystoureterscopy, R RPG, stone extraction, and placement of double J stent for ureteral stricture 11/21/13.   She was seen by Dr. Helene Kelp last week and was found to have UTI and has taken Cipro for 5 days. Was previously treated with Macrodantin without help.     Stone analysis: 1/15 carbonate apatite 100%     Hx of ureteral stricture with a Rt JJ stent in place. She was discharged from the hospital on 06/16/13. She was admitted/transferred from [facility] on 06/04/13 for urosepsis & acute renal failure after a failed attempt of a Rt ureteral stent on 06/02/13 by Dr. Comer Locket. CT showed moderate to severe Rt hydronephrosis with Rt perinephric stranding & fluid, without evidence of a stone. She had a Rt percutaneous nephrostomy tube placed on 06/04/13. A Rt JJ stent was internalized on 06/09/13. She is tolerating the stent well.       She was previously seen in 2009 for hx of kidney stones. She has previously been treated in 04/2006 treated with lithotripsy. Last 24 hour urine was in 2006. Had Lasix renal scan 02/2006 which showed persistent yet improved low grade partial obstruction of the left renal collecting system. Split renal function is unchanged at approximately 56% from the right kidney and 44% from the left kidney. The left kidney has normal flow uptake and excretion and clearance of radiotracer.   Past Medical History Problems  1. History of Arthritis (V13.4) 2. History of Atrial fibrillation (427.31) 3. History of Calculus of ureter (592.1) 4. History of Cervical Cancer (V10.41) 5. History of Dysuria (788.1) 6. History of Flank Pain Left 7. History of acute renal  failure (V13.09) 8. History of esophageal reflux (V12.79) 9. History of heartburn (V12.79) 10. History of hypercholesterolemia (V12.29) 11. History of hypertension (V12.59) 12. History of Hydronephrosis, left (591) 13. History of Nephrolithiasis Of Both Kidneys (V13.01) 14. History of Sepsis (038.9,995.91)  Surgical History Problems  1. History of Back Surgery 2. History of Cholecystectomy 3. History of Cystoscopy With Fragmentation Of Bladder Calculus 4. History of Cystoscopy With Insertion Of Ureteral Stent Right 5. History of Cystoscopy With Ureteroscopy With Removal Of Calculus 6. History of Cystourethroscopy 7. History of Hysterectomy 8. History of Kidney Surgery 9. History of Knee Surgery 10. History of Lithotripsy 11. History of Ostectomy Calcaneus 12. History of Ostectomy Calcaneus For Spur  Current Meds 1. Amiodarone HCl - 100 MG Oral Tablet;  Therapy: (Recorded:16Jan2015) to Recorded 2. Amitriptyline HCl - 25 MG Oral Tablet;  Therapy: (Recorded:15Jan2008) to Recorded 3. Aspirin 325 MG Oral Tablet;  Therapy: (Recorded:03Feb2015) to Recorded 4. Cephalexin 500 MG Oral Capsule; TAKE ONE CAPSULE TWICE A DAY;  Therapy: [id number] to (Evaluate:19Feb2015)  Requested for: [id number]; Last  Rx:12Feb2015 Ordered 5. Ciprofloxacin HCl TABS;  Therapy: (Recorded:24Mar2015) to Recorded 6. Coreg 6.25 MG Oral Tablet;  Therapy: (Recorded:23Sep2014) to Recorded 7. Fenofibrate Micronized 134 MG Oral Capsule;  Therapy: (Recorded:23Sep2014) to Recorded 8. Omeprazole 20 MG Oral Capsule Delayed Release;  Therapy: (Recorded:23Sep2014) to Recorded 9. Phenazopyridine HCl - 200 MG Oral Tablet; TAKE 1 TABLET 3 TIMES DAILY AS NEEDED  FOR PAIN;  Therapy: 22Oct2014 to (Evaluate:29Dec2014)  Requested for: [id number]; Last  Rx:09Dec2014 Ordered  Allergies Medication  1. Codeine Derivatives 2. Flomax CP24 3. Macrodantin CAPS 4. Urocit-K 10 TBCR  Family History Problems  1. Family history  of Bladder Cancer (V16.59) : Mother 2. Family history of Breast Cancer (V16.3) : Sister 3. Family history of Family Health Status Number Of Children   1 son, 1 daughter 4. Family history of Father Deceased At Age ____   71, chf 5. Family history of Heart Disease : Mother 6. Family history of Mother Deceased At Age ____   47, chf 7. Family history of Nephrolithiasis : Mother 65. Family history of Tuberculosis : Father  Social History Problems  1. Denied: History of Alcohol Use 2. Caffeine Use   1 per day 3. Marital History - Currently Married 4. Never A Smoker 5. Occupation:   retired  Review of Systems Genitourinary, constitutional, skin, eye, otolaryngeal, hematologic/lymphatic, cardiovascular, pulmonary, endocrine, musculoskeletal, gastrointestinal, neurological and psychiatric system(s) were reviewed and pertinent findings if present are noted.  Genitourinary: urinary frequency, urinary urgency, nocturia, hematuria and initiating urination requires straining, but urinary stream does not start and stop and no incomplete emptying of bladder.  Gastrointestinal: heartburn and diarrhea, but no nausea, no vomiting and no constipation.  Constitutional: fever, but no night sweats and not feeling tired (fatigue).  Integumentary: no new skin rashes or lesions and no pruritus.  Eyes: no blurred vision and no diplopia.  ENT: no sore throat and no sinus problems.  Hematologic/Lymphatic: a tendency to easily bruise, but no swollen glands.  Cardiovascular: no chest pain and no leg swelling.  Respiratory: cough, but no shortness of breath.  Endocrine: no polydipsia.  Musculoskeletal: no back pain and no joint pain.  Neurological: headache, but no dizziness.  Psychiatric: no depression and no anxiety.    Physical Exam Constitutional: Well nourished and well developed . No acute distress.  ENT:. The ears and nose are normal in appearance.  Neck: The appearance of the neck is normal and  no neck mass is present.  Pulmonary: No respiratory distress and normal respiratory rhythm and effort.  Cardiovascular: Heart rate and rhythm are normal . No peripheral edema.  Abdomen: The abdomen is soft and nontender. No masses are palpated. No CVA tenderness. No hernias are palpable. No hepatosplenomegaly noted.  Lymphatics: The femoral and inguinal nodes are not enlarged or tender.  Skin: Normal skin turgor, no visible rash and no visible skin lesions.  Neuro/Psych:. Mood and affect are appropriate.    Results/Data Urine [Data Includes: Last 1 Day]   [id number]  COLOR YELLOW   APPEARANCE CLOUDY   SPECIFIC GRAVITY 1.020   pH 6.0   GLUCOSE NEG mg/dL  BILIRUBIN NEG   KETONE NEG mg/dL  BLOOD LARGE   PROTEIN 100 mg/dL  UROBILINOGEN 1 mg/dL  NITRITE POS   LEUKOCYTE ESTERASE MOD   SQUAMOUS EPITHELIAL/HPF RARE   WBC TNTC WBC/hpf  RBC 11-20 RBC/hpf  BACTERIA MANY   CRYSTALS NONE SEEN   CASTS NONE SEEN   Selected Results  URINE CULTURE 03Feb2015 10:52AM Rosalyn Gess, Diane  SOURCE : CLEAN CATCH SPECIMEN TYPE: CLEAN CATCH   Test Name Result Flag Reference  CULTURE, URINE Culture, Urine    ===== COLONY COUNT: =====  >=100,000 COLONIES/ML   FINAL REPORT: ESCHERICHIA COLI   SENSITIVITY FOR: ESCHERICHIA COLI    TETRACYCLINE                           RESISTANT       >=  16    AMPICILLIN                             RESISTANT       >=32    AMPICILLIN/SUL                         RESISTANT       >=32    PIPERACILLIN/TAZO                      SENSITIVE        <=4    IMIPENEM                               SENSITIVE     <=0.25    CEFAZOLIN                              SENSITIVE          8    CEFOXITIN                              SENSITIVE        <=4    CEFTRIAXONE                            SENSITIVE        <=1    CEFTAZIDIME                            SENSITIVE        <=1    CEFEPIME                               SENSITIVE        <=1    GENTAMICIN                             SENSITIVE         <=1    TOBRAMYCIN                             SENSITIVE        <=1    CIPROFLOXACIN                          RESISTANT        >=4    LEVOFLOXACIN                           RESISTANT        >=8    NITROFURANTOIN                         SENSITIVE       <=16    TRIMETH/SULFA                          RESISTANT      >=320    END OF REPORT  Assessment Assessed  1. Nephrolithiasis (592.0) 2. Microscopic hematuria (599.72) 3. Dysuria (788.1) 4. Acute cystitis without hematuria (595.0) 5. Ureteral stricture (593.3) 6. Nocturia (788.43)  73 yo female with severe ureteral stricture, and will have a smaller stent ( 22F with 22cm length). She has chronic UTI, resistant to most antibiotics. She will need IM or IV medication in the future. Last UTI resistant to cipro.   Plan  Acute cystitis without hematuria  1. Start: Pyridium 200 MG Oral Tablet; TAKE 1 TABLET 3 TIMES DAILY AS NEEDED FOR  PAIN 2. URINE CULTURE; Status:In Progress - Specimen/Data Collected;   Done: [id number]  1. Try to get a [place]  2. Azo 20 TID  3. KUB; Status:Complete;  Done: [id number] 12:00AM  Last Updated [id number], Diane; 01/17/2014 9:44:17 AM;Ordered;   [id number]; Ordered [id number], Chanique Duca;   UA With REFLEX; [Do Not Release]; Status:Complete;  Done: [id number] 12:00AM  Due:26Mar2015; Marked Important; Last Updated [id number], Diane; 01/17/2014 9:44:17 AM;Ordered; Today;   [id number] Maintenance; Ordered [id number], Denya Buckingham;   Discussion/Summary cc: Dr.L. [employer] Electronically signed by : Carolan Clines, M.D.; Jan 17 2014 11:18AM EST

## 2014-02-06 NOTE — Anesthesia Preprocedure Evaluation (Signed)
Anesthesia Evaluation  Patient identified by MRN, date of birth, ID band Patient awake    Reviewed: Allergy & Precautions, H&P , NPO status , Patient's Chart, lab work & pertinent test results  Airway Mallampati: II TM Distance: >3 FB Neck ROM: Full    Dental no notable dental hx.    Pulmonary neg pulmonary ROS,  breath sounds clear to auscultation  Pulmonary exam normal       Cardiovascular hypertension, Pt. on medications + Past MI negative cardio ROS  + dysrhythmias Atrial Fibrillation Rhythm:Regular Rate:Normal     Neuro/Psych negative neurological ROS  negative psych ROS   GI/Hepatic Neg liver ROS, GERD-  ,  Endo/Other  negative endocrine ROS  Renal/GU Renal diseasenegative Renal ROS     Musculoskeletal negative musculoskeletal ROS (+)   Abdominal   Peds  Hematology  (+) anemia ,   Anesthesia Other Findings   Reproductive/Obstetrics negative OB ROS                           Anesthesia Physical  Anesthesia Plan  ASA: II  Anesthesia Plan: General   Post-op Pain Management:    Induction:   Airway Management Planned: LMA  Additional Equipment:   Intra-op Plan:   Post-operative Plan: Extubation in OR  Informed Consent: I have reviewed the patients History and Physical, chart, labs and discussed the procedure including the risks, benefits and alternatives for the proposed anesthesia with the patient or authorized representative who has indicated his/her understanding and acceptance.   Dental advisory given  Plan Discussed with: CRNA  Anesthesia Plan Comments:         Anesthesia Quick Evaluation

## 2014-02-06 NOTE — OR Nursing (Signed)
Little swelling noted left upper lip. Dr. Lissa Hoard here to see patient.

## 2014-02-06 NOTE — Op Note (Signed)
Pre-operative diagnosis : Chronic urinary tract infection, recurrent kidney stones, right ureteral stricture disease  Postoperative diagnosis:  Same plus medial deviation of proximal ureter, possibly secondary to lumbar surgery also  Operation:  Cystourethroscopy, right retrograde pyelogram with interpretation, right ureteroscopy and pyeloscopy, removal of right double-J stent (24 cm x 5 cm; replacement of stent with 22 cm x 5 French Polaris stent).  Surgeon:  Chauncey Cruel. Gaynelle Arabian, MD  First assistant:  None  Anesthesia:  General LMA  Preparation:  After appropriate preanesthesia, the patient was brought to the operating room, placed on the operating table in the dorsal supine position where general LMA anesthesia was introduced. Her history was double checked. Her armband was double checked. It is noted that the patient has history of ureteral stricture, with a history of urosepsis, acute renal failure, and recurrent ureteral instrumentation. She was transferred to [facility] in August of 2014 from [place], with urosepsis and renal failure after failed attempt of ureteral stent placement. At that time, the patient had perinephric stranding at of fluid, without evidence of stone. She had percutaneous nephrostomy tube placement. Eventually she had right double-J stent placement with internalization. She has a history of recurrent kidney stones. She is split renal function, showing 56% function in the right kidney and 44% function left kidney. Note the patient has a history of lumbar back surgery with plate and screws. In addition, she has had recurrent Escherichia coli urinary tract infection. Her recent culture shows Escherichia coli, sensitive to tape her stone, Rocephin, cefepime, gentamicin, but resistant to Cipro, Levaquin, and sulfur. She is sensitive to nitrofurantoin, but she would this has been unsuccessful.  Review history:  YO female returns today for a 6 week f/u & KUB for possible removal of Rt  JJ stent vs or exchange every 6 mo. She is s/p cystoureterscopy, R RPG, stone extraction, and placement of double J stent for ureteral stricture 11/21/13.  She was seen by Dr. Helene Kelp last week and was found to have UTI and has taken Cipro for 5 days. Was previously treated with Macrodantin without help.  Stone analysis: 1/15 carbonate apatite 100%  Hx of ureteral stricture with a Rt JJ stent in place. She was discharged from the hospital on 06/16/13. She was admitted/transferred from [facility] on 06/04/13 for urosepsis & acute renal failure after a failed attempt of a Rt ureteral stent on 06/02/13 by Dr. Comer Locket. CT showed moderate to severe Rt hydronephrosis with Rt perinephric stranding & fluid, without evidence of a stone. She had a Rt percutaneous nephrostomy tube placed on 06/04/13. A Rt JJ stent was internalized on 06/09/13. She is tolerating the stent well.  She was previously seen in 2009 for hx of kidney stones. She has previously been treated in 04/2006 treated with lithotripsy. Last 24 hour urine was in 2006. Had Lasix renal scan 02/2006 which showed persistent yet improved low grade partial obstruction of the left renal collecting system. Split renal function is unchanged at approximately 56% from the right kidney and 44% from the left kidney. The left kidney has normal flow uptake and excretion and clearance of radiotracer.  Past Medical History  Problems  1. History of Arthritis (V13.4)  2. History of Atrial fibrillation (427.31)  3. History of Calculus of ureter (592.1)  4. History of Cervical Cancer (V10.41)  5. History of Dysuria (788.1)  6. History of Flank Pain Left  7. History of acute renal failure (V13.09)  8. History of esophageal reflux (V12.79)  9. History of heartburn (  V12.79)  10. History of hypercholesterolemia (V12.29)  11. History of hypertension (V12.59)  12. History of Hydronephrosis, left (591)  13. History of Nephrolithiasis Of Both Kidneys (V13.01)  14. History of  Sepsis (038.9,995.91)  Surgical History  Problems  1. History of Back Surgery  2. History of Cholecystectomy  3. History of Cystoscopy With Fragmentation Of Bladder Calculus  4. History of Cystoscopy With Insertion Of Ureteral Stent Right  5. History of Cystoscopy With Ureteroscopy With Removal Of Calculus  6. History of Cystourethroscopy  7. History of Hysterectomy  8. History of Kidney Surgery  9. History of Knee Surgery  10. History of Lithotripsy  11. History of Ostectomy Calcaneus  12. History of Ostectomy Calcaneus For Spur   Statement of  Likelihood of Success: Excellent. TIME-OUT observed.:  Procedure:  Cystourethroscopy was accomplished, showing normal appearing bladder base. The patient has a rather large rectocele, and a pessary is in place. Is not removed. Cystoscopy reveals a normal-appearing bladder, and bladder base. Trigone is normal. The ureteral orifices were normal position. The Polaris stent is identified, and removed. The ureteral orifice is normal. Right retrograde pyelogram reveals normal-appearing lower ureter, with somewhat dilated mid ureter, and very narrowed upper ureter. The upper ureter appears to be tented medially,: The lumbar plate and screws for her back surgery. I elected to proceed with ureteroscopy because the unusual nature of the fusiform dilation of the mid ureter, and then narrowed upper ureter. Ureteroscopy was then accomplished with the short flexible 6 French semirigid ureteroscope around the guidewire placed in the renal pelvis. The lower ureter appeared within normal limits, but the mid ureter and up the ureter, although patent, appeared to have thickened mucosal deposits. I was able to negotiate the scope to within the renal pelvis, and pyeloscopy showed no stones. However, I was able to photodocumentation of multiple indices revealed deposits within the ureter. This appeared inflammatory, and obstructive. The tissue is whitish, and scarred. It did  not appear healthy.  The ureteroscope was removed, and a special order 22 cm x 5 French Polaris stent was placed within the renal pelvis, and in the lower ureter. The bladder was drained of fluid, and the patient was given 15 mg of IV Toradol, as well as IV Tylenol. She was not given a B. and O. suppository, because of history of codeine allergy. She was awakened, taken to recovery room in good condition. She was covered with gentamicin 400 mg time one dose.

## 2014-02-06 NOTE — Transfer of Care (Signed)
Immediate Anesthesia Transfer of Care Note  Patient: [name] [name]  Procedure(s) Performed: Procedure(s) (LRB): CYSTOSCOPY WITH RIGHT RETROGRADE PYELOGRAM RIGHT STENT REMOVAL and REPLACEMENT, Right Ureteroscopy (Right)  Patient Location: PACU  Anesthesia Type: General  Level of Consciousness: awake, oriented, sedated and patient cooperative  Airway & Oxygen Therapy: Patient Spontanous Breathing and Patient connected to face mask oxygen  Post-op Assessment: Report given to PACU RN and Post -op Vital signs reviewed and stable  Post vital signs: Reviewed and stable  Complications: No apparent anesthesia complications

## 2014-02-06 NOTE — Anesthesia Postprocedure Evaluation (Signed)
Anesthesia Post Note  Patient: [name] [name]  Procedure(s) Performed: Procedure(s) (LRB): CYSTOSCOPY WITH RIGHT RETROGRADE PYELOGRAM RIGHT STENT REMOVAL and REPLACEMENT, Right Ureteroscopy (Right)  Anesthesia type: General  Patient location: PACU  Post pain: Pain level controlled  Post assessment: Post-op Vital signs reviewed  Last Vitals: BP 142/68  Pulse 56  Temp(Src) 36.1 C (Oral)  Resp 12  Ht 5\' 7"  (1.702 m)  Wt 185 lb (83.915 kg)  BMI 28.97 kg/m2  SpO2 95%  Post vital signs: Reviewed  Level of consciousness: sedated  Complications: No apparent anesthesia complications

## 2014-02-08 ENCOUNTER — Encounter (HOSPITAL_BASED_OUTPATIENT_CLINIC_OR_DEPARTMENT_OTHER): Payer: Self-pay | Admitting: Urology

## 2014-02-09 DIAGNOSIS — N390 Urinary tract infection, site not specified: Secondary | ICD-10-CM | POA: Diagnosis not present

## 2014-02-09 DIAGNOSIS — R509 Fever, unspecified: Secondary | ICD-10-CM | POA: Diagnosis not present

## 2014-02-09 DIAGNOSIS — N135 Crossing vessel and stricture of ureter without hydronephrosis: Secondary | ICD-10-CM | POA: Diagnosis not present

## 2014-02-20 DIAGNOSIS — R310 Gross hematuria: Secondary | ICD-10-CM | POA: Diagnosis not present

## 2014-02-20 DIAGNOSIS — R300 Dysuria: Secondary | ICD-10-CM | POA: Diagnosis not present

## 2014-02-20 DIAGNOSIS — R350 Frequency of micturition: Secondary | ICD-10-CM | POA: Diagnosis not present

## 2014-02-20 DIAGNOSIS — N3000 Acute cystitis without hematuria: Secondary | ICD-10-CM | POA: Diagnosis not present

## 2014-02-28 DIAGNOSIS — N3000 Acute cystitis without hematuria: Secondary | ICD-10-CM | POA: Diagnosis not present

## 2014-02-28 DIAGNOSIS — N135 Crossing vessel and stricture of ureter without hydronephrosis: Secondary | ICD-10-CM | POA: Diagnosis not present

## 2014-03-02 DIAGNOSIS — N390 Urinary tract infection, site not specified: Secondary | ICD-10-CM | POA: Diagnosis not present

## 2014-03-03 DIAGNOSIS — I4891 Unspecified atrial fibrillation: Secondary | ICD-10-CM | POA: Diagnosis not present

## 2014-03-03 DIAGNOSIS — I10 Essential (primary) hypertension: Secondary | ICD-10-CM | POA: Diagnosis not present

## 2014-03-03 DIAGNOSIS — Z9181 History of falling: Secondary | ICD-10-CM | POA: Diagnosis not present

## 2014-03-03 DIAGNOSIS — N3000 Acute cystitis without hematuria: Secondary | ICD-10-CM | POA: Diagnosis not present

## 2014-03-03 DIAGNOSIS — Z7982 Long term (current) use of aspirin: Secondary | ICD-10-CM | POA: Diagnosis not present

## 2014-03-04 DIAGNOSIS — I10 Essential (primary) hypertension: Secondary | ICD-10-CM | POA: Diagnosis not present

## 2014-03-04 DIAGNOSIS — I4891 Unspecified atrial fibrillation: Secondary | ICD-10-CM | POA: Diagnosis not present

## 2014-03-04 DIAGNOSIS — Z9181 History of falling: Secondary | ICD-10-CM | POA: Diagnosis not present

## 2014-03-04 DIAGNOSIS — N3000 Acute cystitis without hematuria: Secondary | ICD-10-CM | POA: Diagnosis not present

## 2014-03-04 DIAGNOSIS — Z7982 Long term (current) use of aspirin: Secondary | ICD-10-CM | POA: Diagnosis not present

## 2014-03-05 DIAGNOSIS — Z9181 History of falling: Secondary | ICD-10-CM | POA: Diagnosis not present

## 2014-03-05 DIAGNOSIS — I4891 Unspecified atrial fibrillation: Secondary | ICD-10-CM | POA: Diagnosis not present

## 2014-03-05 DIAGNOSIS — I10 Essential (primary) hypertension: Secondary | ICD-10-CM | POA: Diagnosis not present

## 2014-03-05 DIAGNOSIS — N3000 Acute cystitis without hematuria: Secondary | ICD-10-CM | POA: Diagnosis not present

## 2014-03-05 DIAGNOSIS — Z7982 Long term (current) use of aspirin: Secondary | ICD-10-CM | POA: Diagnosis not present

## 2014-03-06 DIAGNOSIS — Z7982 Long term (current) use of aspirin: Secondary | ICD-10-CM | POA: Diagnosis not present

## 2014-03-06 DIAGNOSIS — I4891 Unspecified atrial fibrillation: Secondary | ICD-10-CM | POA: Diagnosis not present

## 2014-03-06 DIAGNOSIS — Z9181 History of falling: Secondary | ICD-10-CM | POA: Diagnosis not present

## 2014-03-06 DIAGNOSIS — N3000 Acute cystitis without hematuria: Secondary | ICD-10-CM | POA: Diagnosis not present

## 2014-03-06 DIAGNOSIS — I10 Essential (primary) hypertension: Secondary | ICD-10-CM | POA: Diagnosis not present

## 2014-03-07 DIAGNOSIS — N3000 Acute cystitis without hematuria: Secondary | ICD-10-CM | POA: Diagnosis not present

## 2014-03-07 DIAGNOSIS — N952 Postmenopausal atrophic vaginitis: Secondary | ICD-10-CM | POA: Diagnosis not present

## 2014-03-07 DIAGNOSIS — N135 Crossing vessel and stricture of ureter without hydronephrosis: Secondary | ICD-10-CM | POA: Diagnosis not present

## 2014-03-07 DIAGNOSIS — N390 Urinary tract infection, site not specified: Secondary | ICD-10-CM | POA: Diagnosis not present

## 2014-03-07 DIAGNOSIS — I4891 Unspecified atrial fibrillation: Secondary | ICD-10-CM | POA: Diagnosis not present

## 2014-03-07 DIAGNOSIS — Z9181 History of falling: Secondary | ICD-10-CM | POA: Diagnosis not present

## 2014-03-07 DIAGNOSIS — R310 Gross hematuria: Secondary | ICD-10-CM | POA: Diagnosis not present

## 2014-03-07 DIAGNOSIS — N200 Calculus of kidney: Secondary | ICD-10-CM | POA: Diagnosis not present

## 2014-03-07 DIAGNOSIS — I10 Essential (primary) hypertension: Secondary | ICD-10-CM | POA: Diagnosis not present

## 2014-03-07 DIAGNOSIS — Z7982 Long term (current) use of aspirin: Secondary | ICD-10-CM | POA: Diagnosis not present

## 2014-03-08 ENCOUNTER — Other Ambulatory Visit: Payer: Self-pay | Admitting: Urology

## 2014-03-08 DIAGNOSIS — Z9181 History of falling: Secondary | ICD-10-CM | POA: Diagnosis not present

## 2014-03-08 DIAGNOSIS — Z7982 Long term (current) use of aspirin: Secondary | ICD-10-CM | POA: Diagnosis not present

## 2014-03-08 DIAGNOSIS — I4891 Unspecified atrial fibrillation: Secondary | ICD-10-CM | POA: Diagnosis not present

## 2014-03-08 DIAGNOSIS — I10 Essential (primary) hypertension: Secondary | ICD-10-CM | POA: Diagnosis not present

## 2014-03-08 DIAGNOSIS — N3000 Acute cystitis without hematuria: Secondary | ICD-10-CM | POA: Diagnosis not present

## 2014-03-09 ENCOUNTER — Encounter (HOSPITAL_COMMUNITY): Payer: Self-pay | Admitting: Pharmacy Technician

## 2014-03-09 ENCOUNTER — Encounter (HOSPITAL_COMMUNITY): Payer: Self-pay | Admitting: *Deleted

## 2014-03-09 DIAGNOSIS — I10 Essential (primary) hypertension: Secondary | ICD-10-CM | POA: Diagnosis not present

## 2014-03-09 DIAGNOSIS — N3000 Acute cystitis without hematuria: Secondary | ICD-10-CM | POA: Diagnosis not present

## 2014-03-09 DIAGNOSIS — Z7982 Long term (current) use of aspirin: Secondary | ICD-10-CM | POA: Diagnosis not present

## 2014-03-09 DIAGNOSIS — I4891 Unspecified atrial fibrillation: Secondary | ICD-10-CM | POA: Diagnosis not present

## 2014-03-09 DIAGNOSIS — Z9181 History of falling: Secondary | ICD-10-CM | POA: Diagnosis not present

## 2014-03-14 DIAGNOSIS — Z9181 History of falling: Secondary | ICD-10-CM | POA: Diagnosis not present

## 2014-03-14 DIAGNOSIS — Z7982 Long term (current) use of aspirin: Secondary | ICD-10-CM | POA: Diagnosis not present

## 2014-03-14 DIAGNOSIS — I4891 Unspecified atrial fibrillation: Secondary | ICD-10-CM | POA: Diagnosis not present

## 2014-03-14 DIAGNOSIS — N3000 Acute cystitis without hematuria: Secondary | ICD-10-CM | POA: Diagnosis not present

## 2014-03-14 DIAGNOSIS — I10 Essential (primary) hypertension: Secondary | ICD-10-CM | POA: Diagnosis not present

## 2014-03-15 ENCOUNTER — Encounter (HOSPITAL_COMMUNITY): Payer: Self-pay | Admitting: *Deleted

## 2014-03-15 ENCOUNTER — Ambulatory Visit (HOSPITAL_COMMUNITY)
Admission: RE | Admit: 2014-03-15 | Discharge: 2014-03-15 | Disposition: A | Discharge status: Home / Self Care | Payer: Medicare Other | Source: Ambulatory Visit | Attending: Urology | Admitting: Urology

## 2014-03-15 ENCOUNTER — Encounter (HOSPITAL_COMMUNITY)
Admission: RE | Disposition: A | Discharge status: Home / Self Care | Payer: Self-pay | Source: Ambulatory Visit | Attending: Urology

## 2014-03-15 ENCOUNTER — Encounter (HOSPITAL_COMMUNITY): Payer: Medicare Other | Admitting: Anesthesiology

## 2014-03-15 ENCOUNTER — Ambulatory Visit (HOSPITAL_COMMUNITY): Payer: Medicare Other | Admitting: Anesthesiology

## 2014-03-15 DIAGNOSIS — I10 Essential (primary) hypertension: Secondary | ICD-10-CM | POA: Insufficient documentation

## 2014-03-15 DIAGNOSIS — N200 Calculus of kidney: Secondary | ICD-10-CM | POA: Diagnosis not present

## 2014-03-15 DIAGNOSIS — Z881 Allergy status to other antibiotic agents status: Secondary | ICD-10-CM | POA: Insufficient documentation

## 2014-03-15 DIAGNOSIS — I4891 Unspecified atrial fibrillation: Secondary | ICD-10-CM | POA: Insufficient documentation

## 2014-03-15 DIAGNOSIS — I471 Supraventricular tachycardia: Secondary | ICD-10-CM | POA: Diagnosis not present

## 2014-03-15 DIAGNOSIS — K219 Gastro-esophageal reflux disease without esophagitis: Secondary | ICD-10-CM | POA: Insufficient documentation

## 2014-03-15 DIAGNOSIS — N35919 Unspecified urethral stricture, male, unspecified site: Secondary | ICD-10-CM | POA: Diagnosis not present

## 2014-03-15 DIAGNOSIS — Z8542 Personal history of malignant neoplasm of other parts of uterus: Secondary | ICD-10-CM | POA: Diagnosis not present

## 2014-03-15 DIAGNOSIS — N135 Crossing vessel and stricture of ureter without hydronephrosis: Secondary | ICD-10-CM | POA: Diagnosis not present

## 2014-03-15 DIAGNOSIS — E785 Hyperlipidemia, unspecified: Secondary | ICD-10-CM | POA: Diagnosis not present

## 2014-03-15 HISTORY — PX: CYSTOSCOPY WITH RETROGRADE PYELOGRAM, URETEROSCOPY AND STENT PLACEMENT: SHX5789

## 2014-03-15 LAB — CBC
HCT: 36.7 % (ref 36.0–46.0)
Hemoglobin: 11.1 g/dL — ABNORMAL LOW (ref 12.0–15.0)
MCH: 23.9 pg — ABNORMAL LOW (ref 26.0–34.0)
MCHC: 30.2 g/dL (ref 30.0–36.0)
MCV: 78.9 fL (ref 78.0–100.0)
Platelets: 434 10*3/uL — ABNORMAL HIGH (ref 150–400)
RBC: 4.65 MIL/uL (ref 3.87–5.11)
RDW: 16.9 % — ABNORMAL HIGH (ref 11.5–15.5)
WBC: 7.7 10*3/uL (ref 4.0–10.5)

## 2014-03-15 LAB — BASIC METABOLIC PANEL
BUN: 24 mg/dL — ABNORMAL HIGH (ref 6–23)
CO2: 25 mEq/L (ref 19–32)
Calcium: 9.4 mg/dL (ref 8.4–10.5)
Chloride: 104 mEq/L (ref 96–112)
Creatinine, Ser: 1.26 mg/dL — ABNORMAL HIGH (ref 0.50–1.10)
GFR calc Af Amer: 48 mL/min — ABNORMAL LOW (ref >90)
GFR calc non Af Amer: 42 mL/min — ABNORMAL LOW (ref >90)
Glucose, Bld: 98 mg/dL (ref 70–99)
Potassium: 4.2 mEq/L (ref 3.7–5.3)
Sodium: 139 mEq/L (ref 137–147)

## 2014-03-15 SURGERY — CYSTOURETEROSCOPY, WITH RETROGRADE PYELOGRAM AND STENT INSERTION
Anesthesia: General | Laterality: Bilateral

## 2014-03-15 MED ORDER — PROPOFOL 10 MG/ML IV BOLUS
INTRAVENOUS | Status: DC | PRN
  Administered 2014-03-15: 100 mg via INTRAVENOUS
  Administered 2014-03-15: 50 mg via INTRAVENOUS

## 2014-03-15 MED ORDER — SODIUM CHLORIDE 0.9 % IR SOLN
Status: DC | PRN
  Administered 2014-03-15: 3000 mL

## 2014-03-15 MED ORDER — GENTAMICIN IN SALINE 1.6-0.9 MG/ML-% IV SOLN: 80.0000 mg | INTRAVENOUS | Status: DC

## 2014-03-15 MED ORDER — ONDANSETRON HCL 4 MG/2ML IJ SOLN
INTRAMUSCULAR | Status: DC | PRN
  Administered 2014-03-15: 4 mg via INTRAVENOUS

## 2014-03-15 MED ORDER — OXYCODONE-ACETAMINOPHEN 5-325 MG PO TABS: 1.0000 | ORAL_TABLET | Freq: Four times a day (QID) | ORAL | Status: DC | PRN

## 2014-03-15 MED ORDER — SODIUM CHLORIDE 0.9 % IR SOLN
Status: DC | PRN
  Administered 2014-03-15: 1000 mL

## 2014-03-15 MED ORDER — FENTANYL CITRATE 0.05 MG/ML IJ SOLN: 25.0000 ug | INTRAMUSCULAR | Status: DC | PRN

## 2014-03-15 MED ORDER — IOHEXOL 300 MG/ML  SOLN
INTRAMUSCULAR | Status: DC | PRN
  Administered 2014-03-15: 50 mL

## 2014-03-15 MED ORDER — OXYCODONE HCL 5 MG PO TABS: 5.0000 mg | ORAL_TABLET | Freq: Once | ORAL | Status: DC | PRN

## 2014-03-15 MED ORDER — SULFAMETHOXAZOLE-TMP DS 800-160 MG PO TABS: 1.0000 | ORAL_TABLET | Freq: Two times a day (BID) | ORAL | Status: DC

## 2014-03-15 MED ORDER — ONDANSETRON HCL 4 MG/2ML IJ SOLN
INTRAMUSCULAR | Status: AC
  Filled 2014-03-15: qty 2

## 2014-03-15 MED ORDER — DEXAMETHASONE SODIUM PHOSPHATE 10 MG/ML IJ SOLN
INTRAMUSCULAR | Status: AC
  Filled 2014-03-15: qty 1

## 2014-03-15 MED ORDER — LACTATED RINGERS IV SOLN: INTRAVENOUS | Status: DC

## 2014-03-15 MED ORDER — PROPOFOL 10 MG/ML IV BOLUS
INTRAVENOUS | Status: AC
  Filled 2014-03-15: qty 20

## 2014-03-15 MED ORDER — SENNOSIDES-DOCUSATE SODIUM 8.6-50 MG PO TABS: 1.0000 | ORAL_TABLET | Freq: Two times a day (BID) | ORAL | Status: DC

## 2014-03-15 MED ORDER — MEPERIDINE HCL 50 MG/ML IJ SOLN
6.2500 mg | INTRAMUSCULAR | Status: DC | PRN
Start: 2014-03-15 — End: 2014-03-15

## 2014-03-15 MED ORDER — FENTANYL CITRATE 0.05 MG/ML IJ SOLN
INTRAMUSCULAR | Status: DC | PRN
  Administered 2014-03-15 (×4): 25 ug via INTRAVENOUS

## 2014-03-15 MED ORDER — OXYCODONE HCL 5 MG/5ML PO SOLN
5.0000 mg | Freq: Once | ORAL | Status: DC | PRN
  Filled 2014-03-15: qty 5

## 2014-03-15 MED ORDER — PROMETHAZINE HCL 25 MG/ML IJ SOLN: 6.2500 mg | INTRAMUSCULAR | Status: DC | PRN

## 2014-03-15 MED ORDER — LACTATED RINGERS IV SOLN
INTRAVENOUS | Status: DC
Start: 2014-03-15 — End: 2014-03-15
  Administered 2014-03-15: 10:00:00 via INTRAVENOUS

## 2014-03-15 MED ORDER — DEXAMETHASONE SODIUM PHOSPHATE 10 MG/ML IJ SOLN
INTRAMUSCULAR | Status: DC | PRN
  Administered 2014-03-15: 10 mg via INTRAVENOUS

## 2014-03-15 MED ORDER — HYDROMORPHONE HCL PF 1 MG/ML IJ SOLN: 0.2500 mg | INTRAMUSCULAR | Status: DC | PRN

## 2014-03-15 MED ORDER — GENTAMICIN SULFATE 40 MG/ML IJ SOLN
5.0000 mg/kg | INTRAVENOUS | Status: AC
  Administered 2014-03-15: 360 mg via INTRAVENOUS
  Filled 2014-03-15: qty 9

## 2014-03-15 SURGICAL SUPPLY — 23 items
BAG URINE DRAINAGE (UROLOGICAL SUPPLIES) IMPLANT
BASKET LASER NITINOL 1.9FR (BASKET) IMPLANT
BASKET STNLS GEMINI 4WIRE 3FR (BASKET) IMPLANT
BASKET ZERO TIP NITINOL 2.4FR (BASKET) IMPLANT
CATH INTERMIT  6FR 70CM (CATHETERS) ×2 IMPLANT
CLOTH BEACON ORANGE TIMEOUT ST (SAFETY) ×2 IMPLANT
DRAPE CAMERA CLOSED 9X96 (DRAPES) ×2 IMPLANT
ELECT REM PT RETURN 9FT ADLT (ELECTROSURGICAL)
ELECTRODE REM PT RTRN 9FT ADLT (ELECTROSURGICAL) IMPLANT
FIBER LASER FLEXIVA 200 (UROLOGICAL SUPPLIES) IMPLANT
FIBER LASER FLEXIVA 365 (UROLOGICAL SUPPLIES) IMPLANT
GLOVE BIOGEL M STRL SZ7.5 (GLOVE) ×2 IMPLANT
GOWN STRL REUS W/TWL LRG LVL3 (GOWN DISPOSABLE) ×4 IMPLANT
GUIDEWIRE ANG ZIPWIRE 038X150 (WIRE) ×2 IMPLANT
GUIDEWIRE STR DUAL SENSOR (WIRE) ×2 IMPLANT
IV NS IRRIG 3000ML ARTHROMATIC (IV SOLUTION) IMPLANT
PACK CYSTO (CUSTOM PROCEDURE TRAY) ×2 IMPLANT
SHEATH ACCESS URETERAL 24CM (SHEATH) ×2 IMPLANT
STENT POLARIS 5FRX22 (STENTS) ×2 IMPLANT
SYR 30ML LL (SYRINGE) ×2 IMPLANT
SYRINGE 10CC LL (SYRINGE) IMPLANT
SYRINGE IRR TOOMEY STRL 70CC (SYRINGE) IMPLANT
TUBE FEEDING 8FR 16IN STR KANG (MISCELLANEOUS) ×2 IMPLANT

## 2014-03-15 NOTE — Progress Notes (Signed)
Pt is noted to have string taped on thigh area.  Pt verbalized understanding of instructions on how to pull the string for stent removal.

## 2014-03-15 NOTE — Transfer of Care (Signed)
Immediate Anesthesia Transfer of Care Note  Patient: [name] [name]  Procedure(s) Performed: Procedure(s): CYSTOSCOPY WITH BILATERAL RETROGRADE PYELOGRAM, RIGHT DIAGNOSTIC URETEROSCOPY AND STENT EXCHANGE (Bilateral)  Patient Location: PACU  Anesthesia Type:General  Level of Consciousness: awake, alert , oriented and patient cooperative  Airway & Oxygen Therapy: Patient Spontanous Breathing and Patient connected to face mask oxygen  Post-op Assessment: Report given to PACU RN and Post -op Vital signs reviewed and stable  Post vital signs: Reviewed and stable  Complications: No apparent anesthesia complications

## 2014-03-15 NOTE — Brief Op Note (Signed)
03/15/2014  11:58 AM  PATIENT:  [name] [name]  73 y.o. female  PRE-OPERATIVE DIAGNOSIS:  RIGHT URETERAL STRICTURE, RECURRENT KIDNEY STONES  POST-OPERATIVE DIAGNOSIS:  RIGHT URETERAL STRICTURE, RECURRENT KIDNEY STONES  PROCEDURE:  Procedure(s): CYSTOSCOPY WITH BILATERAL RETROGRADE PYELOGRAM, RIGHT DIAGNOSTIC URETEROSCOPY AND STENT EXCHANGE (Bilateral)  SURGEON:  Surgeon(s) and Role:    * Alexis Frock, MD - Primary  PHYSICIAN ASSISTANT:   ASSISTANTS: none   ANESTHESIA:   spinal  EBL:  Total I/O In: 700 [I.V.:700] Out: -   BLOOD ADMINISTERED:none  DRAINS: none   LOCAL MEDICATIONS USED:  NONE  SPECIMEN:  No Specimen  DISPOSITION OF SPECIMEN:  N/A  COUNTS:  YES  TOURNIQUET:  * No tourniquets in log *  DICTATION: .Other Dictation: Dictation Number [id number]  PLAN OF CARE: Discharge to home after PACU  PATIENT DISPOSITION:  PACU - hemodynamically stable.   Delay start of Pharmacological VTE agent (>24hrs) due to surgical blood loss or risk of bleeding: not applicable

## 2014-03-15 NOTE — Anesthesia Preprocedure Evaluation (Signed)
Anesthesia Evaluation  Patient identified by MRN, date of birth, ID band Patient awake    Reviewed: Allergy & Precautions, H&P , NPO status , Patient's Chart, lab work & pertinent test results, reviewed documented beta blocker date and time   Airway Mallampati: II TM Distance: >3 FB Neck ROM: full    Dental no notable dental hx. (+) Teeth Intact, Dental Advisory Given   Pulmonary neg pulmonary ROS,  breath sounds clear to auscultation  Pulmonary exam normal       Cardiovascular Exercise Tolerance: Good hypertension, Pt. on home beta blockers + Past MI + dysrhythmias Atrial Fibrillation Rhythm:Irregular Rate:Normal  History septic shock with ARF 8/14.  NSTEMI.  ECG - AF   Neuro/Psych negative neurological ROS  negative psych ROS   GI/Hepatic negative GI ROS, Neg liver ROS, GERD-  Medicated and Controlled,  Endo/Other  negative endocrine ROS  Renal/GU negative Renal ROS  negative genitourinary   Musculoskeletal   Abdominal   Peds  Hematology negative hematology ROS (+)   Anesthesia Other Findings   Reproductive/Obstetrics negative OB ROS                           Anesthesia Physical Anesthesia Plan  ASA: III  Anesthesia Plan: General   Post-op Pain Management:    Induction: Intravenous  Airway Management Planned: LMA  Additional Equipment:   Intra-op Plan:   Post-operative Plan:   Informed Consent: I have reviewed the patients History and Physical, chart, labs and discussed the procedure including the risks, benefits and alternatives for the proposed anesthesia with the patient or authorized representative who has indicated his/her understanding and acceptance.   Dental Advisory Given  Plan Discussed with: CRNA and Surgeon  Anesthesia Plan Comments:         Anesthesia Quick Evaluation

## 2014-03-15 NOTE — Progress Notes (Signed)
Dr. Tresa Moore in to check on pt.

## 2014-03-15 NOTE — Anesthesia Postprocedure Evaluation (Signed)
  Anesthesia Post-op Note  Patient: [name] [name]  Procedure(s) Performed: Procedure(s) (LRB): CYSTOSCOPY WITH BILATERAL RETROGRADE PYELOGRAM, RIGHT DIAGNOSTIC URETEROSCOPY AND STENT EXCHANGE (Bilateral)  Patient Location: PACU  Anesthesia Type: General  Level of Consciousness: awake and alert   Airway and Oxygen Therapy: Patient Spontanous Breathing  Post-op Pain: mild  Post-op Assessment: Post-op Vital signs reviewed, Patient's Cardiovascular Status Stable, Respiratory Function Stable, Patent Airway and No signs of Nausea or vomiting  Last Vitals:  Filed Vitals:   03/15/14 1230  BP: 148/65  Pulse: 58  Temp: 36.9 C  Resp: 11    Post-op Vital Signs: stable   Complications: No apparent anesthesia complications

## 2014-03-15 NOTE — Discharge Instructions (Signed)
1 - You may have urinary urgency (bladder spasms) and bloody urine on / off with stent in place. This is normal.  2 - Call MD or go to ER for fever >102, severe pain / nausea / vomiting not relieved by medications, or acute change in medical status  3 - Remove tethered stent on Monday morning at home by pulling on string, then blue/white plastic tubing, and discarding. Dr. Tresa Moore is in the office Monday if any issues arise.

## 2014-03-15 NOTE — H&P (Signed)
[name] [name] is an 73 y.o. female.    Chief Complaint: Pre Op Right Diagnostic Ureteroscopy, Retrograde Pyelogram, Stent Exchange  HPI:     1 - Rt Ureteral Stricture - Multiple prior stone procedures on right including SWL, ureteroscopy, and neph tube. Concern for high grade distal stricure 05/2013 during episode of urosepsis treated with neph tube by antegrade nephrostogram. F/U Ureteroscopy 01/2014 revealed normal right distal ureter but some concern for proximal stricture, but did accommodate ureteroscopy all the way to kidney. Also h/o L-spine surgery with hardware, but posterior approach  2 - Recurrent Nephrolithiasis - Prior composition CaPO4, CaOx Pre 2015 - URS x several, open ureterolithotomy on Left, SWL on right x2 05/2013 - CT stone free  3 - Recurrent Urinary Tract Infection - Several episodes of simple cystitis, one febrile / pyelo episode 05/2013 as per above from e. coli sens to keflex, gent, but resistnat to others (cipro, bactrim). Most recent UCX's negative. Pelvic 02/2014 atrophic vaginitis, reduced prolapse with well-sized pessary. PVR 02/2014 "48mL"  PMH sig for AFib (rate controlled, no blood thinners), lap chole, hyst, left open ureterolithotomy, ureteroscopy x several, shockwave lithotripsy.  Today [name] [name] is seen to proceed with right diagnostic ureteroscopy for better characterization of her h/o right ureteral stricture. No interval fevers. Most recent UCX negative.   Past Medical History  Diagnosis Date  . GERD (gastroesophageal reflux disease)   . Hyperlipidemia   . History of kidney stones   . History of septic shock     2011 and 06-04-2013  w/ acute renal failure  . PAF (paroxysmal atrial fibrillation) DX JUNE 2012    CARDIOLOGIST--  DR KRASOWSKI ([facility] , [place])  CHADS2  . Hypertension   . History of uterine cancer     STAGE I  --  S/P TAH W/ BSO  (NO OTHER TX)  . Wears glasses   . Ureteral stricture, right     Past Surgical History   Procedure Laterality Date  . Cysto/  bilateral retrograde pyelogram/ right ureteroscopy and stent placement  10-04-2005  . Cysto/ left ureteroscopic stone extraction  04-03-2006  . Cysto/  balloon dilation right ureteral stricture/ stent placement  06-02-2013  . Transthoracic echocardiogram  06-07-2013    LVSF 45-50% /  MILD DILATED LA  . Lumbar fusion  JULY 2013  . Cholecystectomy  2010  . Nephrolithotomy  X2 YRS AGO  . Total knee arthroplasty Bilateral 1992  &  1996  . Total abdominal hysterectomy w/ bilateral salpingoophorectomy  1989  . Cystoscopy with litholapaxy N/A 11/21/2013    Procedure: CYSTOSCOPY WITH LITHOLAPAXY;  Surgeon: Ailene Rud, MD;  Location: [facility];  Service: Urology;  Laterality: N/A;  . Ureteroscopy Right 11/21/2013    Procedure: URETEROSCOPY WITH STENT REMOVAL AND REPLACEMENT;  Surgeon: Ailene Rud, MD;  Location[facility];  Service: Urology;  Laterality: Right;  . Cystoscopy w/ ureteral stent placement Right 02/06/2014    Procedure: CYSTOSCOPY WITH RIGHT RETROGRADE PYELOGRAM RIGHT STENT REMOVAL and REPLACEMENT, Right Ureteroscopy;  Surgeon: Ailene Rud, MD;  Location: [facility];  Service: Urology;  Laterality: Right;    History reviewed. No pertinent family history. Social History:  reports that she has never smoked. She has never used smokeless tobacco. She reports that she does not drink alcohol or use illicit drugs.  Allergies:  Allergies  Allergen Reactions  . Codeine Nausea Only and Other (See Comments)    hallucinations  . Hydrocodone Nausea Only  and Other (See Comments)  . Macrodantin [Nitrofurantoin] Nausea And Vomiting    No prescriptions prior to admission    No results found for this or any previous visit (from the past 48 hour(s)). No results found.  Review of Systems  Constitutional: Negative.  Negative for fever and chills.  HENT: Negative.   Eyes: Negative.    Respiratory: Negative.   Cardiovascular: Negative.   Gastrointestinal: Negative.   Genitourinary: Negative.   Musculoskeletal: Negative.   Skin: Negative.   Neurological: Negative.   Endo/Heme/Allergies: Negative.   Psychiatric/Behavioral: Negative.     There were no vitals taken for this visit. Physical Exam  Constitutional: She is oriented to person, place, and time. She appears well-developed and well-nourished.  HENT:  Head: Normocephalic.  Eyes: Pupils are equal, round, and reactive to light.  Neck: Normal range of motion.  Respiratory: Effort normal.  GI: Soft.  Truncal obesity, several remote scars w/o hernia.  Genitourinary:  No CVAT  Musculoskeletal: Normal range of motion.  Neurological: She is alert and oriented to person, place, and time.  Skin: Skin is warm and dry.  Psychiatric: She has a normal mood and affect. Her behavior is normal. Judgment and thought content normal.     Assessment/Plan    1 - Rt Ureteral Stricture - unusual case. Prior imaging suggesting distal stricture, recent ureteroscopy suggesting possible proximal stricture, more recent imaging w/o hydro whatsoever. She certainly does have risk factors for stricture includign multiple prior stone procedures as well as back hardware (retroperitoneal fibrosis). Before any invasive management (reimplant, interposition, etc) would strongly favor repeat endoscopic exam with diagnostic flexibel ureteroscopy hopefully follwed by stent removal and renogram if anatomy favorable and she wants to proceed.  We reiscussed diagnostic ureteroscopy in detail.  We rediscussed risks including bleeding, infection, damage to kidney / ureter  bladder, rarely loss of kidney. We rediscussed anesthetic risks and rare but serious surgical complications including DVT, PE, MI, and mortality. We specifically addressed that in 5-10% of cases a staged approach is required with stenting followed by re-attempt ureteroscopy if anatomy  unfavorable. The patient voiced understanding and wises to proceed today as planned.   2 - Recurrent Nephrolithiasis - Stone free by current imaging.   3 - Recurrent Urinary Tract Infection - atrophic vaginitis and pessary likely contributing. Most recent UCX negative.    Alexis Frock 03/15/2014, 6:26 AM

## 2014-03-16 ENCOUNTER — Encounter (HOSPITAL_COMMUNITY): Payer: Self-pay | Admitting: Urology

## 2014-03-16 NOTE — Op Note (Signed)
[name] [name], [name] [name]             ACCOUNT NO.:  [id number]  MEDICAL RECORD NO.:  [id number]  LOCATION:  [facility]                         FACILITY:  [facility]  PHYSICIAN:  Alexis Frock, MD     DATE OF BIRTH:  1941/06/27  DATE OF PROCEDURE:03/15/2014 DATE OF DISCHARGE:  03/15/2014                              OPERATIVE REPORT   DIAGNOSIS:  Recurrent nephrolithiasis, question of right ureteral stricture.  PROCEDURE: 1. Cystoscopy with bilateral retrograde pyelogram interpretation. 2. Right diagnostic ureteroscopy. 3. Right ureteral stent exchange 5 x 22 polaris with tether.  ESTIMATED BLOOD LOSS:  Nil.  COMPLICATIONS:  None.  SPECIMEN:  None.  FINDINGS: 1. Unremarkable left retrograde pyelogram. 2. Right retrograde pyelogram with several areas of relative     narrowing, 1 in the mid ureter and other in the distal ureter     without focal stricture. 3. Diagnostic right ureteroscopy corroborating relative narrowing of     segment in the mid ureter and distal ureter, however, this easily     accommodated a 14-French sheath and was not felt to represent high-     grade stricture.  Photodocumentation performed.  INDICATIONS:  [name] [name] is a 73 year old lady with history of recurrent nephrolithiasis status post multiple prior procedures including open left ureteral lithotomy, bilateral ureteroscopic stone manipulation, right shockwave lithotripsy times several. She had a recent episode of right hydronephrosis and pyelonephritis requiring right nephrostomy tube, but there was some concern whether she had a right ureteral stricture.  She was referred for consideration of possible bowel interposition and ureteral reconstruction.  Before such undertaking, it was felt that a repeat diagnostic ureteroscopy would be warranted to help document location and severity of any strictures on the right side.  Informed consent was obtained and placed in medical record.  PROCEDURE IN DETAIL:  The  patient being [facility],  procedure being cysto bilateral retrograde pyelogram, right diagnostic ureteroscopy was confirmed.  Procedure was carried out.  Time-out was performed.  Intravenous antibiotics administered.  General LMA anesthesia was introduced.  The patient was placed into a low lithotomy position.  Sterile field was created by prepping the patient's vagina, introitus, and proximal thighs using iodine x3. Next, cystourethroscopy performed using a 22-French rigid scope 12 vessel lens.  Inspection of urinary bladder revealed no diverticula, calcifications, or papular lesions.  Distal end of right ureteral stent was seen in situ.  The left ureteral orifice was cannulated with a 6-French end-hole catheter and left retrograde pyelogram was obtained.  Left retrograde pyelogram demonstrated a single left ureter with single system left kidney. There were no filling defects or narrowing noted. There is somewhat of an angulation at the area of the UPJ but without hydronephrosis and was felt to be normal anatomic variant.  The right distal stent coil was grasped brought to the level of the urethral meatus through which a 0.03 Glidewire was advanced at the level of the upper pole and set aside as a safety wire. This was exchanged for a 6-French end-hole catheter and right retrograde pyelogram was obtained.  Right retrograde pyelogram demonstrates a single right ureter with single system right kidney.  There was mild caliectasis and proximal ureteral dilation to relative narrowing  in the mid ureter as well as a relative narrowing in the distal ureter.  However, these did appear to be completely patent of contrast and not high-grade strictures. It was felt that endoscopic examination of this area is clearly warranted. As such, the 0.038 Glidewire was reintroduced at the level of lower pole, set aside as a safety wire.  Next, semi-rigid ureteroscopy was performed in the distal  orifice of the right ureter alongside a separate Sensor working wire and as expected, there were areas of relative narrowing in the distal ureter and mid ureter, however, these easily accommodated ureteroscope and 2 wires and again no focal stricturing was noted.  The semi-rigid ureteroscope was then  exchanged for the 12/14 ureteral access sheath over the Sensor working wire to the level of proximal ureter.  Next, flexible digital ureteroscopy was performed of the entire right kidney and proximal right ureter and again there was mild caliectasis without frank hydronephrosis.  Inspection of the proximal ureter revealed no additional injuries worrisome for possible stricture. The sheath was then carefully removed under continuous ureteroscopic vision and multiple photos were taken of the areas of relative narrowing again documenting that there was no high-grade stricture in these locations.  Given the very favorable appearance of the entire length of the right ureter overall, it was felt that only temporary ureteral stenting would be warranted.  As such, a new 5 x 22 Polaris type stent was placed with remaining safety wire.  Proximal and distal deployment were noted.  The tether was left in place and fashioned to the thigh. The procedure was then terminated.  The patient tolerated the procedure well.  There were no immediate periprocedural complications.  The patient was taken to postanesthesia care unit in stable condition. Please note that during all ureteroscopic portions of the procedure, the patient also had an 8-French feeding tube in the urinary bladder for pressure release.          ______________________________ Alexis Frock, MD     TM/MEDQ  D:  03/15/2014  T:  03/15/2014  Job:  [id number]

## 2014-03-21 DIAGNOSIS — Z7982 Long term (current) use of aspirin: Secondary | ICD-10-CM | POA: Diagnosis not present

## 2014-03-21 DIAGNOSIS — I4891 Unspecified atrial fibrillation: Secondary | ICD-10-CM | POA: Diagnosis not present

## 2014-03-21 DIAGNOSIS — Z9181 History of falling: Secondary | ICD-10-CM | POA: Diagnosis not present

## 2014-03-21 DIAGNOSIS — I10 Essential (primary) hypertension: Secondary | ICD-10-CM | POA: Diagnosis not present

## 2014-03-21 DIAGNOSIS — N3000 Acute cystitis without hematuria: Secondary | ICD-10-CM | POA: Diagnosis not present

## 2014-03-24 DIAGNOSIS — L981 Factitial dermatitis: Secondary | ICD-10-CM | POA: Diagnosis not present

## 2014-03-24 DIAGNOSIS — L219 Seborrheic dermatitis, unspecified: Secondary | ICD-10-CM | POA: Diagnosis not present

## 2014-03-28 DIAGNOSIS — N952 Postmenopausal atrophic vaginitis: Secondary | ICD-10-CM | POA: Diagnosis not present

## 2014-03-28 DIAGNOSIS — N200 Calculus of kidney: Secondary | ICD-10-CM | POA: Diagnosis not present

## 2014-03-28 DIAGNOSIS — N3020 Other chronic cystitis without hematuria: Secondary | ICD-10-CM | POA: Diagnosis not present

## 2014-03-28 DIAGNOSIS — N1330 Unspecified hydronephrosis: Secondary | ICD-10-CM | POA: Diagnosis not present

## 2014-04-04 DIAGNOSIS — N819 Female genital prolapse, unspecified: Secondary | ICD-10-CM | POA: Diagnosis not present

## 2014-04-07 DIAGNOSIS — N1330 Unspecified hydronephrosis: Secondary | ICD-10-CM | POA: Diagnosis not present

## 2014-04-07 DIAGNOSIS — R109 Unspecified abdominal pain: Secondary | ICD-10-CM | POA: Diagnosis not present

## 2014-04-07 DIAGNOSIS — N3020 Other chronic cystitis without hematuria: Secondary | ICD-10-CM | POA: Diagnosis not present

## 2014-04-10 DIAGNOSIS — N952 Postmenopausal atrophic vaginitis: Secondary | ICD-10-CM | POA: Diagnosis not present

## 2014-04-10 DIAGNOSIS — N200 Calculus of kidney: Secondary | ICD-10-CM | POA: Diagnosis not present

## 2014-04-10 DIAGNOSIS — N3020 Other chronic cystitis without hematuria: Secondary | ICD-10-CM | POA: Diagnosis not present

## 2014-04-24 DIAGNOSIS — R300 Dysuria: Secondary | ICD-10-CM | POA: Diagnosis not present

## 2014-05-07 DIAGNOSIS — N200 Calculus of kidney: Secondary | ICD-10-CM | POA: Diagnosis not present

## 2014-05-08 DIAGNOSIS — N200 Calculus of kidney: Secondary | ICD-10-CM | POA: Diagnosis not present

## 2014-05-09 DIAGNOSIS — N3020 Other chronic cystitis without hematuria: Secondary | ICD-10-CM | POA: Diagnosis not present

## 2014-05-09 DIAGNOSIS — R109 Unspecified abdominal pain: Secondary | ICD-10-CM | POA: Diagnosis not present

## 2014-05-09 DIAGNOSIS — N1330 Unspecified hydronephrosis: Secondary | ICD-10-CM | POA: Diagnosis not present

## 2014-05-09 DIAGNOSIS — N200 Calculus of kidney: Secondary | ICD-10-CM | POA: Diagnosis not present

## 2014-05-09 DIAGNOSIS — N952 Postmenopausal atrophic vaginitis: Secondary | ICD-10-CM | POA: Diagnosis not present

## 2014-05-11 ENCOUNTER — Other Ambulatory Visit (HOSPITAL_COMMUNITY): Payer: Self-pay | Admitting: Urology

## 2014-05-11 DIAGNOSIS — N1330 Unspecified hydronephrosis: Secondary | ICD-10-CM

## 2014-05-23 DIAGNOSIS — M47817 Spondylosis without myelopathy or radiculopathy, lumbosacral region: Secondary | ICD-10-CM | POA: Diagnosis not present

## 2014-06-12 DIAGNOSIS — I4891 Unspecified atrial fibrillation: Secondary | ICD-10-CM | POA: Diagnosis not present

## 2014-06-12 DIAGNOSIS — E785 Hyperlipidemia, unspecified: Secondary | ICD-10-CM | POA: Diagnosis not present

## 2014-06-20 DIAGNOSIS — H35349 Macular cyst, hole, or pseudohole, unspecified eye: Secondary | ICD-10-CM | POA: Diagnosis not present

## 2014-06-20 DIAGNOSIS — H547 Unspecified visual loss: Secondary | ICD-10-CM | POA: Diagnosis not present

## 2014-06-22 DIAGNOSIS — H35349 Macular cyst, hole, or pseudohole, unspecified eye: Secondary | ICD-10-CM | POA: Diagnosis not present

## 2014-06-23 ENCOUNTER — Ambulatory Visit (HOSPITAL_COMMUNITY)
Admission: RE | Admit: 2014-06-23 | Discharge: 2014-06-23 | Disposition: A | Discharge status: Home / Self Care | Payer: Medicare Other | Source: Ambulatory Visit | Attending: Urology | Admitting: Urology

## 2014-06-23 DIAGNOSIS — N1330 Unspecified hydronephrosis: Secondary | ICD-10-CM | POA: Insufficient documentation

## 2014-06-23 MED ORDER — FUROSEMIDE 10 MG/ML IJ SOLN
40.0000 mg | Freq: Once | INTRAMUSCULAR | Status: AC
  Administered 2014-06-23: 40 mg via INTRAVENOUS
  Filled 2014-06-23: qty 4

## 2014-06-23 MED ORDER — TECHNETIUM TC 99M MERTIATIDE
16.5000 | Freq: Once | INTRAVENOUS | Status: AC | PRN
Start: 2014-06-23 — End: 2014-06-23
  Administered 2014-06-23: 17 via INTRAVENOUS

## 2014-06-29 DIAGNOSIS — N952 Postmenopausal atrophic vaginitis: Secondary | ICD-10-CM | POA: Diagnosis not present

## 2014-06-29 DIAGNOSIS — N200 Calculus of kidney: Secondary | ICD-10-CM | POA: Diagnosis not present

## 2014-06-29 DIAGNOSIS — N1330 Unspecified hydronephrosis: Secondary | ICD-10-CM | POA: Diagnosis not present

## 2014-06-29 DIAGNOSIS — N3020 Other chronic cystitis without hematuria: Secondary | ICD-10-CM | POA: Diagnosis not present

## 2014-07-11 DIAGNOSIS — H35379 Puckering of macula, unspecified eye: Secondary | ICD-10-CM | POA: Diagnosis not present

## 2014-07-11 DIAGNOSIS — H35349 Macular cyst, hole, or pseudohole, unspecified eye: Secondary | ICD-10-CM | POA: Diagnosis not present

## 2014-07-11 DIAGNOSIS — H268 Other specified cataract: Secondary | ICD-10-CM | POA: Diagnosis not present

## 2014-07-28 DIAGNOSIS — Z466 Encounter for fitting and adjustment of urinary device: Secondary | ICD-10-CM | POA: Diagnosis not present

## 2014-07-28 DIAGNOSIS — N8111 Cystocele, midline: Secondary | ICD-10-CM | POA: Diagnosis not present

## 2014-08-17 DIAGNOSIS — H35341 Macular cyst, hole, or pseudohole, right eye: Secondary | ICD-10-CM | POA: Diagnosis not present

## 2014-09-20 DIAGNOSIS — R300 Dysuria: Secondary | ICD-10-CM | POA: Diagnosis not present

## 2014-09-20 DIAGNOSIS — Z0000 Encounter for general adult medical examination without abnormal findings: Secondary | ICD-10-CM | POA: Diagnosis not present

## 2014-09-20 DIAGNOSIS — Z23 Encounter for immunization: Secondary | ICD-10-CM | POA: Diagnosis not present

## 2014-09-20 DIAGNOSIS — I10 Essential (primary) hypertension: Secondary | ICD-10-CM | POA: Diagnosis not present

## 2014-09-20 DIAGNOSIS — Z79899 Other long term (current) drug therapy: Secondary | ICD-10-CM | POA: Diagnosis not present

## 2014-09-20 DIAGNOSIS — G43109 Migraine with aura, not intractable, without status migrainosus: Secondary | ICD-10-CM | POA: Diagnosis not present

## 2014-09-20 DIAGNOSIS — E780 Pure hypercholesterolemia: Secondary | ICD-10-CM | POA: Diagnosis not present

## 2014-09-20 DIAGNOSIS — K219 Gastro-esophageal reflux disease without esophagitis: Secondary | ICD-10-CM | POA: Diagnosis not present

## 2014-09-27 DIAGNOSIS — R7989 Other specified abnormal findings of blood chemistry: Secondary | ICD-10-CM | POA: Diagnosis not present

## 2014-10-26 DIAGNOSIS — H35341 Macular cyst, hole, or pseudohole, right eye: Secondary | ICD-10-CM | POA: Diagnosis not present

## 2014-11-10 DIAGNOSIS — N8111 Cystocele, midline: Secondary | ICD-10-CM | POA: Diagnosis not present

## 2014-11-10 DIAGNOSIS — Z4689 Encounter for fitting and adjustment of other specified devices: Secondary | ICD-10-CM | POA: Diagnosis not present

## 2014-12-25 DIAGNOSIS — I509 Heart failure, unspecified: Secondary | ICD-10-CM | POA: Diagnosis not present

## 2014-12-25 DIAGNOSIS — E785 Hyperlipidemia, unspecified: Secondary | ICD-10-CM | POA: Diagnosis not present

## 2014-12-25 DIAGNOSIS — I10 Essential (primary) hypertension: Secondary | ICD-10-CM | POA: Diagnosis not present

## 2014-12-25 DIAGNOSIS — R0600 Dyspnea, unspecified: Secondary | ICD-10-CM | POA: Diagnosis not present

## 2015-01-01 DIAGNOSIS — N3020 Other chronic cystitis without hematuria: Secondary | ICD-10-CM | POA: Diagnosis not present

## 2015-01-01 DIAGNOSIS — N200 Calculus of kidney: Secondary | ICD-10-CM | POA: Diagnosis not present

## 2015-01-01 DIAGNOSIS — N952 Postmenopausal atrophic vaginitis: Secondary | ICD-10-CM | POA: Diagnosis not present

## 2015-01-01 DIAGNOSIS — N1330 Unspecified hydronephrosis: Secondary | ICD-10-CM | POA: Diagnosis not present

## 2015-01-02 DIAGNOSIS — R5383 Other fatigue: Secondary | ICD-10-CM | POA: Diagnosis not present

## 2015-01-02 DIAGNOSIS — R0602 Shortness of breath: Secondary | ICD-10-CM | POA: Diagnosis not present

## 2015-01-16 DIAGNOSIS — I480 Paroxysmal atrial fibrillation: Secondary | ICD-10-CM | POA: Diagnosis not present

## 2015-01-16 DIAGNOSIS — E785 Hyperlipidemia, unspecified: Secondary | ICD-10-CM | POA: Diagnosis not present

## 2015-01-16 DIAGNOSIS — R0789 Other chest pain: Secondary | ICD-10-CM | POA: Diagnosis not present

## 2015-01-16 DIAGNOSIS — R0602 Shortness of breath: Secondary | ICD-10-CM | POA: Diagnosis not present

## 2015-01-23 DIAGNOSIS — R0602 Shortness of breath: Secondary | ICD-10-CM | POA: Diagnosis not present

## 2015-01-29 DIAGNOSIS — Z1231 Encounter for screening mammogram for malignant neoplasm of breast: Secondary | ICD-10-CM | POA: Diagnosis not present

## 2015-02-09 DIAGNOSIS — H2511 Age-related nuclear cataract, right eye: Secondary | ICD-10-CM | POA: Diagnosis not present

## 2015-02-12 DIAGNOSIS — N819 Female genital prolapse, unspecified: Secondary | ICD-10-CM | POA: Diagnosis not present

## 2015-02-20 DIAGNOSIS — M810 Age-related osteoporosis without current pathological fracture: Secondary | ICD-10-CM | POA: Diagnosis not present

## 2015-02-20 DIAGNOSIS — I10 Essential (primary) hypertension: Secondary | ICD-10-CM | POA: Diagnosis not present

## 2015-02-20 DIAGNOSIS — Z7982 Long term (current) use of aspirin: Secondary | ICD-10-CM | POA: Diagnosis not present

## 2015-02-20 DIAGNOSIS — Z79899 Other long term (current) drug therapy: Secondary | ICD-10-CM | POA: Diagnosis not present

## 2015-02-20 DIAGNOSIS — H269 Unspecified cataract: Secondary | ICD-10-CM | POA: Diagnosis not present

## 2015-02-20 DIAGNOSIS — H2511 Age-related nuclear cataract, right eye: Secondary | ICD-10-CM | POA: Diagnosis not present

## 2015-02-20 DIAGNOSIS — I4891 Unspecified atrial fibrillation: Secondary | ICD-10-CM | POA: Diagnosis not present

## 2015-02-20 DIAGNOSIS — E785 Hyperlipidemia, unspecified: Secondary | ICD-10-CM | POA: Diagnosis not present

## 2015-02-20 DIAGNOSIS — K219 Gastro-esophageal reflux disease without esophagitis: Secondary | ICD-10-CM | POA: Diagnosis not present

## 2015-02-26 DIAGNOSIS — R0600 Dyspnea, unspecified: Secondary | ICD-10-CM | POA: Diagnosis not present

## 2015-03-09 DIAGNOSIS — J189 Pneumonia, unspecified organism: Secondary | ICD-10-CM | POA: Diagnosis not present

## 2015-03-12 DIAGNOSIS — J189 Pneumonia, unspecified organism: Secondary | ICD-10-CM | POA: Diagnosis not present

## 2015-03-22 DIAGNOSIS — R0600 Dyspnea, unspecified: Secondary | ICD-10-CM | POA: Diagnosis not present

## 2015-04-03 DIAGNOSIS — R0600 Dyspnea, unspecified: Secondary | ICD-10-CM | POA: Diagnosis not present

## 2015-04-24 DIAGNOSIS — Z79899 Other long term (current) drug therapy: Secondary | ICD-10-CM | POA: Diagnosis not present

## 2015-04-24 DIAGNOSIS — H2512 Age-related nuclear cataract, left eye: Secondary | ICD-10-CM | POA: Diagnosis not present

## 2015-04-24 DIAGNOSIS — K219 Gastro-esophageal reflux disease without esophagitis: Secondary | ICD-10-CM | POA: Diagnosis not present

## 2015-04-24 DIAGNOSIS — I4891 Unspecified atrial fibrillation: Secondary | ICD-10-CM | POA: Diagnosis not present

## 2015-04-24 DIAGNOSIS — H269 Unspecified cataract: Secondary | ICD-10-CM | POA: Diagnosis not present

## 2015-04-24 DIAGNOSIS — I10 Essential (primary) hypertension: Secondary | ICD-10-CM | POA: Diagnosis not present

## 2015-05-08 DIAGNOSIS — Z883 Allergy status to other anti-infective agents status: Secondary | ICD-10-CM | POA: Diagnosis not present

## 2015-05-08 DIAGNOSIS — I119 Hypertensive heart disease without heart failure: Secondary | ICD-10-CM | POA: Diagnosis not present

## 2015-05-08 DIAGNOSIS — D473 Essential (hemorrhagic) thrombocythemia: Secondary | ICD-10-CM | POA: Diagnosis not present

## 2015-05-08 DIAGNOSIS — Z7982 Long term (current) use of aspirin: Secondary | ICD-10-CM | POA: Diagnosis not present

## 2015-05-08 DIAGNOSIS — I5033 Acute on chronic diastolic (congestive) heart failure: Secondary | ICD-10-CM | POA: Diagnosis not present

## 2015-05-08 DIAGNOSIS — R7989 Other specified abnormal findings of blood chemistry: Secondary | ICD-10-CM | POA: Diagnosis not present

## 2015-05-08 DIAGNOSIS — E780 Pure hypercholesterolemia: Secondary | ICD-10-CM | POA: Diagnosis present

## 2015-05-08 DIAGNOSIS — Z87442 Personal history of urinary calculi: Secondary | ICD-10-CM | POA: Diagnosis not present

## 2015-05-08 DIAGNOSIS — I5031 Acute diastolic (congestive) heart failure: Secondary | ICD-10-CM | POA: Diagnosis present

## 2015-05-08 DIAGNOSIS — I10 Essential (primary) hypertension: Secondary | ICD-10-CM | POA: Diagnosis not present

## 2015-05-08 DIAGNOSIS — R079 Chest pain, unspecified: Secondary | ICD-10-CM | POA: Diagnosis not present

## 2015-05-08 DIAGNOSIS — R002 Palpitations: Secondary | ICD-10-CM | POA: Diagnosis not present

## 2015-05-08 DIAGNOSIS — R0602 Shortness of breath: Secondary | ICD-10-CM | POA: Diagnosis not present

## 2015-05-08 DIAGNOSIS — Z90710 Acquired absence of both cervix and uterus: Secondary | ICD-10-CM | POA: Diagnosis not present

## 2015-05-08 DIAGNOSIS — Z859 Personal history of malignant neoplasm, unspecified: Secondary | ICD-10-CM | POA: Diagnosis not present

## 2015-05-08 DIAGNOSIS — Z885 Allergy status to narcotic agent status: Secondary | ICD-10-CM | POA: Diagnosis not present

## 2015-05-08 DIAGNOSIS — Z23 Encounter for immunization: Secondary | ICD-10-CM | POA: Diagnosis not present

## 2015-05-08 DIAGNOSIS — Z8249 Family history of ischemic heart disease and other diseases of the circulatory system: Secondary | ICD-10-CM | POA: Diagnosis not present

## 2015-05-08 DIAGNOSIS — I129 Hypertensive chronic kidney disease with stage 1 through stage 4 chronic kidney disease, or unspecified chronic kidney disease: Secondary | ICD-10-CM | POA: Diagnosis present

## 2015-05-08 DIAGNOSIS — K219 Gastro-esophageal reflux disease without esophagitis: Secondary | ICD-10-CM | POA: Diagnosis not present

## 2015-05-08 DIAGNOSIS — I4891 Unspecified atrial fibrillation: Secondary | ICD-10-CM | POA: Diagnosis not present

## 2015-05-08 DIAGNOSIS — Z87440 Personal history of urinary (tract) infections: Secondary | ICD-10-CM | POA: Diagnosis not present

## 2015-05-08 DIAGNOSIS — I480 Paroxysmal atrial fibrillation: Secondary | ICD-10-CM | POA: Diagnosis not present

## 2015-05-08 DIAGNOSIS — N189 Chronic kidney disease, unspecified: Secondary | ICD-10-CM | POA: Diagnosis not present

## 2015-05-08 DIAGNOSIS — R609 Edema, unspecified: Secondary | ICD-10-CM | POA: Diagnosis not present

## 2015-05-08 DIAGNOSIS — I481 Persistent atrial fibrillation: Secondary | ICD-10-CM | POA: Diagnosis not present

## 2015-05-08 DIAGNOSIS — R000 Tachycardia, unspecified: Secondary | ICD-10-CM | POA: Diagnosis not present

## 2015-05-18 DIAGNOSIS — E876 Hypokalemia: Secondary | ICD-10-CM | POA: Diagnosis not present

## 2015-05-18 DIAGNOSIS — I4891 Unspecified atrial fibrillation: Secondary | ICD-10-CM | POA: Diagnosis not present

## 2015-05-23 DIAGNOSIS — H524 Presbyopia: Secondary | ICD-10-CM | POA: Diagnosis not present

## 2015-05-31 DIAGNOSIS — E785 Hyperlipidemia, unspecified: Secondary | ICD-10-CM | POA: Insufficient documentation

## 2015-05-31 DIAGNOSIS — Z6832 Body mass index (BMI) 32.0-32.9, adult: Secondary | ICD-10-CM | POA: Diagnosis not present

## 2015-05-31 DIAGNOSIS — I480 Paroxysmal atrial fibrillation: Secondary | ICD-10-CM | POA: Diagnosis not present

## 2015-05-31 DIAGNOSIS — I5032 Chronic diastolic (congestive) heart failure: Secondary | ICD-10-CM | POA: Insufficient documentation

## 2015-05-31 HISTORY — DX: Chronic diastolic (congestive) heart failure: I50.32

## 2015-05-31 HISTORY — DX: Hyperlipidemia, unspecified: E78.5

## 2015-06-12 DIAGNOSIS — L219 Seborrheic dermatitis, unspecified: Secondary | ICD-10-CM | POA: Diagnosis not present

## 2015-06-12 DIAGNOSIS — L299 Pruritus, unspecified: Secondary | ICD-10-CM | POA: Diagnosis not present

## 2015-06-18 DIAGNOSIS — Z4689 Encounter for fitting and adjustment of other specified devices: Secondary | ICD-10-CM | POA: Diagnosis not present

## 2015-06-18 DIAGNOSIS — N8111 Cystocele, midline: Secondary | ICD-10-CM | POA: Diagnosis not present

## 2015-07-11 DIAGNOSIS — E785 Hyperlipidemia, unspecified: Secondary | ICD-10-CM | POA: Diagnosis not present

## 2015-07-11 DIAGNOSIS — I5032 Chronic diastolic (congestive) heart failure: Secondary | ICD-10-CM | POA: Diagnosis not present

## 2015-07-11 DIAGNOSIS — Z6831 Body mass index (BMI) 31.0-31.9, adult: Secondary | ICD-10-CM | POA: Diagnosis not present

## 2015-07-11 DIAGNOSIS — I480 Paroxysmal atrial fibrillation: Secondary | ICD-10-CM | POA: Diagnosis not present

## 2015-08-03 DIAGNOSIS — L304 Erythema intertrigo: Secondary | ICD-10-CM | POA: Diagnosis not present

## 2015-08-10 DIAGNOSIS — Z79899 Other long term (current) drug therapy: Secondary | ICD-10-CM | POA: Diagnosis not present

## 2015-08-10 DIAGNOSIS — I10 Essential (primary) hypertension: Secondary | ICD-10-CM | POA: Diagnosis not present

## 2015-08-10 DIAGNOSIS — R7309 Other abnormal glucose: Secondary | ICD-10-CM | POA: Diagnosis not present

## 2015-08-10 DIAGNOSIS — Z23 Encounter for immunization: Secondary | ICD-10-CM | POA: Diagnosis not present

## 2015-08-10 DIAGNOSIS — E7800 Pure hypercholesterolemia, unspecified: Secondary | ICD-10-CM | POA: Diagnosis not present

## 2015-08-28 DIAGNOSIS — Z6831 Body mass index (BMI) 31.0-31.9, adult: Secondary | ICD-10-CM | POA: Diagnosis not present

## 2015-08-28 DIAGNOSIS — I480 Paroxysmal atrial fibrillation: Secondary | ICD-10-CM | POA: Diagnosis not present

## 2015-08-28 DIAGNOSIS — I5032 Chronic diastolic (congestive) heart failure: Secondary | ICD-10-CM | POA: Diagnosis not present

## 2015-08-28 DIAGNOSIS — E785 Hyperlipidemia, unspecified: Secondary | ICD-10-CM | POA: Diagnosis not present

## 2015-08-31 DIAGNOSIS — I4891 Unspecified atrial fibrillation: Secondary | ICD-10-CM | POA: Diagnosis not present

## 2015-09-25 DIAGNOSIS — I480 Paroxysmal atrial fibrillation: Secondary | ICD-10-CM | POA: Diagnosis not present

## 2015-09-25 DIAGNOSIS — E785 Hyperlipidemia, unspecified: Secondary | ICD-10-CM | POA: Diagnosis not present

## 2015-09-25 DIAGNOSIS — Z6832 Body mass index (BMI) 32.0-32.9, adult: Secondary | ICD-10-CM | POA: Diagnosis not present

## 2015-09-25 DIAGNOSIS — I5032 Chronic diastolic (congestive) heart failure: Secondary | ICD-10-CM | POA: Diagnosis not present

## 2015-10-08 DIAGNOSIS — N760 Acute vaginitis: Secondary | ICD-10-CM | POA: Diagnosis not present

## 2015-10-08 DIAGNOSIS — N819 Female genital prolapse, unspecified: Secondary | ICD-10-CM | POA: Diagnosis not present

## 2015-10-08 DIAGNOSIS — N8111 Cystocele, midline: Secondary | ICD-10-CM | POA: Diagnosis not present

## 2015-10-24 DIAGNOSIS — M25562 Pain in left knee: Secondary | ICD-10-CM | POA: Diagnosis not present

## 2015-11-23 DIAGNOSIS — L219 Seborrheic dermatitis, unspecified: Secondary | ICD-10-CM | POA: Diagnosis not present

## 2015-11-23 DIAGNOSIS — L299 Pruritus, unspecified: Secondary | ICD-10-CM | POA: Diagnosis not present

## 2015-12-03 DIAGNOSIS — R300 Dysuria: Secondary | ICD-10-CM | POA: Diagnosis not present

## 2015-12-21 DIAGNOSIS — S72415S Nondisplaced unspecified condyle fracture of lower end of left femur, sequela: Secondary | ICD-10-CM | POA: Diagnosis not present

## 2015-12-21 DIAGNOSIS — S72432A Displaced fracture of medial condyle of left femur, initial encounter for closed fracture: Secondary | ICD-10-CM | POA: Diagnosis not present

## 2015-12-21 DIAGNOSIS — S82402A Unspecified fracture of shaft of left fibula, initial encounter for closed fracture: Secondary | ICD-10-CM | POA: Diagnosis not present

## 2015-12-21 DIAGNOSIS — S72422A Displaced fracture of lateral condyle of left femur, initial encounter for closed fracture: Secondary | ICD-10-CM | POA: Diagnosis not present

## 2015-12-21 DIAGNOSIS — Z96652 Presence of left artificial knee joint: Secondary | ICD-10-CM | POA: Diagnosis not present

## 2015-12-21 DIAGNOSIS — M25562 Pain in left knee: Secondary | ICD-10-CM | POA: Diagnosis not present

## 2015-12-21 DIAGNOSIS — S72425A Nondisplaced fracture of lateral condyle of left femur, initial encounter for closed fracture: Secondary | ICD-10-CM | POA: Diagnosis not present

## 2015-12-21 DIAGNOSIS — T148 Other injury of unspecified body region: Secondary | ICD-10-CM | POA: Diagnosis not present

## 2015-12-21 DIAGNOSIS — S72435A Nondisplaced fracture of medial condyle of left femur, initial encounter for closed fracture: Secondary | ICD-10-CM | POA: Diagnosis not present

## 2015-12-26 DIAGNOSIS — M978XXA Periprosthetic fracture around other internal prosthetic joint, initial encounter: Secondary | ICD-10-CM | POA: Diagnosis not present

## 2015-12-28 DIAGNOSIS — Z9181 History of falling: Secondary | ICD-10-CM | POA: Diagnosis not present

## 2015-12-28 DIAGNOSIS — M9712XD Periprosthetic fracture around internal prosthetic left knee joint, subsequent encounter: Secondary | ICD-10-CM | POA: Diagnosis not present

## 2015-12-28 DIAGNOSIS — Z79891 Long term (current) use of opiate analgesic: Secondary | ICD-10-CM | POA: Diagnosis not present

## 2015-12-28 DIAGNOSIS — I129 Hypertensive chronic kidney disease with stage 1 through stage 4 chronic kidney disease, or unspecified chronic kidney disease: Secondary | ICD-10-CM | POA: Diagnosis not present

## 2015-12-28 DIAGNOSIS — N189 Chronic kidney disease, unspecified: Secondary | ICD-10-CM | POA: Diagnosis not present

## 2015-12-28 DIAGNOSIS — Z7901 Long term (current) use of anticoagulants: Secondary | ICD-10-CM | POA: Diagnosis not present

## 2016-01-01 DIAGNOSIS — I129 Hypertensive chronic kidney disease with stage 1 through stage 4 chronic kidney disease, or unspecified chronic kidney disease: Secondary | ICD-10-CM | POA: Diagnosis not present

## 2016-01-01 DIAGNOSIS — Z79891 Long term (current) use of opiate analgesic: Secondary | ICD-10-CM | POA: Diagnosis not present

## 2016-01-01 DIAGNOSIS — Z7901 Long term (current) use of anticoagulants: Secondary | ICD-10-CM | POA: Diagnosis not present

## 2016-01-01 DIAGNOSIS — M9712XD Periprosthetic fracture around internal prosthetic left knee joint, subsequent encounter: Secondary | ICD-10-CM | POA: Diagnosis not present

## 2016-01-01 DIAGNOSIS — Z9181 History of falling: Secondary | ICD-10-CM | POA: Diagnosis not present

## 2016-01-01 DIAGNOSIS — N189 Chronic kidney disease, unspecified: Secondary | ICD-10-CM | POA: Diagnosis not present

## 2016-01-02 DIAGNOSIS — Z79891 Long term (current) use of opiate analgesic: Secondary | ICD-10-CM | POA: Diagnosis not present

## 2016-01-02 DIAGNOSIS — Z9181 History of falling: Secondary | ICD-10-CM | POA: Diagnosis not present

## 2016-01-02 DIAGNOSIS — I129 Hypertensive chronic kidney disease with stage 1 through stage 4 chronic kidney disease, or unspecified chronic kidney disease: Secondary | ICD-10-CM | POA: Diagnosis not present

## 2016-01-02 DIAGNOSIS — N189 Chronic kidney disease, unspecified: Secondary | ICD-10-CM | POA: Diagnosis not present

## 2016-01-02 DIAGNOSIS — M9712XD Periprosthetic fracture around internal prosthetic left knee joint, subsequent encounter: Secondary | ICD-10-CM | POA: Diagnosis not present

## 2016-01-02 DIAGNOSIS — Z7901 Long term (current) use of anticoagulants: Secondary | ICD-10-CM | POA: Diagnosis not present

## 2016-01-09 DIAGNOSIS — I129 Hypertensive chronic kidney disease with stage 1 through stage 4 chronic kidney disease, or unspecified chronic kidney disease: Secondary | ICD-10-CM | POA: Diagnosis not present

## 2016-01-09 DIAGNOSIS — Z7901 Long term (current) use of anticoagulants: Secondary | ICD-10-CM | POA: Diagnosis not present

## 2016-01-09 DIAGNOSIS — N189 Chronic kidney disease, unspecified: Secondary | ICD-10-CM | POA: Diagnosis not present

## 2016-01-09 DIAGNOSIS — Z9181 History of falling: Secondary | ICD-10-CM | POA: Diagnosis not present

## 2016-01-09 DIAGNOSIS — M9712XD Periprosthetic fracture around internal prosthetic left knee joint, subsequent encounter: Secondary | ICD-10-CM | POA: Diagnosis not present

## 2016-01-09 DIAGNOSIS — Z79891 Long term (current) use of opiate analgesic: Secondary | ICD-10-CM | POA: Diagnosis not present

## 2016-01-16 DIAGNOSIS — Z9181 History of falling: Secondary | ICD-10-CM | POA: Diagnosis not present

## 2016-01-16 DIAGNOSIS — N189 Chronic kidney disease, unspecified: Secondary | ICD-10-CM | POA: Diagnosis not present

## 2016-01-16 DIAGNOSIS — Z7901 Long term (current) use of anticoagulants: Secondary | ICD-10-CM | POA: Diagnosis not present

## 2016-01-16 DIAGNOSIS — Z79891 Long term (current) use of opiate analgesic: Secondary | ICD-10-CM | POA: Diagnosis not present

## 2016-01-16 DIAGNOSIS — M9712XD Periprosthetic fracture around internal prosthetic left knee joint, subsequent encounter: Secondary | ICD-10-CM | POA: Diagnosis not present

## 2016-01-16 DIAGNOSIS — I129 Hypertensive chronic kidney disease with stage 1 through stage 4 chronic kidney disease, or unspecified chronic kidney disease: Secondary | ICD-10-CM | POA: Diagnosis not present

## 2016-01-17 DIAGNOSIS — I5032 Chronic diastolic (congestive) heart failure: Secondary | ICD-10-CM | POA: Diagnosis not present

## 2016-01-17 DIAGNOSIS — I480 Paroxysmal atrial fibrillation: Secondary | ICD-10-CM | POA: Diagnosis not present

## 2016-01-17 DIAGNOSIS — E785 Hyperlipidemia, unspecified: Secondary | ICD-10-CM | POA: Diagnosis not present

## 2016-01-23 DIAGNOSIS — M978XXA Periprosthetic fracture around other internal prosthetic joint, initial encounter: Secondary | ICD-10-CM | POA: Diagnosis not present

## 2016-01-24 DIAGNOSIS — I480 Paroxysmal atrial fibrillation: Secondary | ICD-10-CM | POA: Diagnosis not present

## 2016-01-25 DIAGNOSIS — M9712XD Periprosthetic fracture around internal prosthetic left knee joint, subsequent encounter: Secondary | ICD-10-CM | POA: Diagnosis not present

## 2016-01-25 DIAGNOSIS — Z7901 Long term (current) use of anticoagulants: Secondary | ICD-10-CM | POA: Diagnosis not present

## 2016-01-25 DIAGNOSIS — Z79891 Long term (current) use of opiate analgesic: Secondary | ICD-10-CM | POA: Diagnosis not present

## 2016-01-25 DIAGNOSIS — I129 Hypertensive chronic kidney disease with stage 1 through stage 4 chronic kidney disease, or unspecified chronic kidney disease: Secondary | ICD-10-CM | POA: Diagnosis not present

## 2016-01-25 DIAGNOSIS — N189 Chronic kidney disease, unspecified: Secondary | ICD-10-CM | POA: Diagnosis not present

## 2016-01-25 DIAGNOSIS — Z9181 History of falling: Secondary | ICD-10-CM | POA: Diagnosis not present

## 2016-01-28 DIAGNOSIS — Z7901 Long term (current) use of anticoagulants: Secondary | ICD-10-CM | POA: Diagnosis not present

## 2016-01-28 DIAGNOSIS — I129 Hypertensive chronic kidney disease with stage 1 through stage 4 chronic kidney disease, or unspecified chronic kidney disease: Secondary | ICD-10-CM | POA: Diagnosis not present

## 2016-01-28 DIAGNOSIS — N189 Chronic kidney disease, unspecified: Secondary | ICD-10-CM | POA: Diagnosis not present

## 2016-01-28 DIAGNOSIS — Z9181 History of falling: Secondary | ICD-10-CM | POA: Diagnosis not present

## 2016-01-28 DIAGNOSIS — M9712XD Periprosthetic fracture around internal prosthetic left knee joint, subsequent encounter: Secondary | ICD-10-CM | POA: Diagnosis not present

## 2016-01-28 DIAGNOSIS — Z79891 Long term (current) use of opiate analgesic: Secondary | ICD-10-CM | POA: Diagnosis not present

## 2016-01-30 DIAGNOSIS — N189 Chronic kidney disease, unspecified: Secondary | ICD-10-CM | POA: Diagnosis not present

## 2016-01-30 DIAGNOSIS — Z7901 Long term (current) use of anticoagulants: Secondary | ICD-10-CM | POA: Diagnosis not present

## 2016-01-30 DIAGNOSIS — Z79891 Long term (current) use of opiate analgesic: Secondary | ICD-10-CM | POA: Diagnosis not present

## 2016-01-30 DIAGNOSIS — M9712XD Periprosthetic fracture around internal prosthetic left knee joint, subsequent encounter: Secondary | ICD-10-CM | POA: Diagnosis not present

## 2016-01-30 DIAGNOSIS — I129 Hypertensive chronic kidney disease with stage 1 through stage 4 chronic kidney disease, or unspecified chronic kidney disease: Secondary | ICD-10-CM | POA: Diagnosis not present

## 2016-01-30 DIAGNOSIS — Z9181 History of falling: Secondary | ICD-10-CM | POA: Diagnosis not present

## 2016-02-01 DIAGNOSIS — M9712XD Periprosthetic fracture around internal prosthetic left knee joint, subsequent encounter: Secondary | ICD-10-CM | POA: Diagnosis not present

## 2016-02-01 DIAGNOSIS — Z9181 History of falling: Secondary | ICD-10-CM | POA: Diagnosis not present

## 2016-02-01 DIAGNOSIS — I129 Hypertensive chronic kidney disease with stage 1 through stage 4 chronic kidney disease, or unspecified chronic kidney disease: Secondary | ICD-10-CM | POA: Diagnosis not present

## 2016-02-01 DIAGNOSIS — Z7901 Long term (current) use of anticoagulants: Secondary | ICD-10-CM | POA: Diagnosis not present

## 2016-02-01 DIAGNOSIS — Z79891 Long term (current) use of opiate analgesic: Secondary | ICD-10-CM | POA: Diagnosis not present

## 2016-02-01 DIAGNOSIS — N189 Chronic kidney disease, unspecified: Secondary | ICD-10-CM | POA: Diagnosis not present

## 2016-02-04 DIAGNOSIS — I129 Hypertensive chronic kidney disease with stage 1 through stage 4 chronic kidney disease, or unspecified chronic kidney disease: Secondary | ICD-10-CM | POA: Diagnosis not present

## 2016-02-04 DIAGNOSIS — M9712XD Periprosthetic fracture around internal prosthetic left knee joint, subsequent encounter: Secondary | ICD-10-CM | POA: Diagnosis not present

## 2016-02-04 DIAGNOSIS — Z79891 Long term (current) use of opiate analgesic: Secondary | ICD-10-CM | POA: Diagnosis not present

## 2016-02-04 DIAGNOSIS — N189 Chronic kidney disease, unspecified: Secondary | ICD-10-CM | POA: Diagnosis not present

## 2016-02-04 DIAGNOSIS — Z7901 Long term (current) use of anticoagulants: Secondary | ICD-10-CM | POA: Diagnosis not present

## 2016-02-04 DIAGNOSIS — Z9181 History of falling: Secondary | ICD-10-CM | POA: Diagnosis not present

## 2016-02-06 DIAGNOSIS — M9712XD Periprosthetic fracture around internal prosthetic left knee joint, subsequent encounter: Secondary | ICD-10-CM | POA: Diagnosis not present

## 2016-02-06 DIAGNOSIS — Z7901 Long term (current) use of anticoagulants: Secondary | ICD-10-CM | POA: Diagnosis not present

## 2016-02-06 DIAGNOSIS — Z9181 History of falling: Secondary | ICD-10-CM | POA: Diagnosis not present

## 2016-02-06 DIAGNOSIS — N189 Chronic kidney disease, unspecified: Secondary | ICD-10-CM | POA: Diagnosis not present

## 2016-02-06 DIAGNOSIS — I129 Hypertensive chronic kidney disease with stage 1 through stage 4 chronic kidney disease, or unspecified chronic kidney disease: Secondary | ICD-10-CM | POA: Diagnosis not present

## 2016-02-06 DIAGNOSIS — Z79891 Long term (current) use of opiate analgesic: Secondary | ICD-10-CM | POA: Diagnosis not present

## 2016-02-07 DIAGNOSIS — M9712XD Periprosthetic fracture around internal prosthetic left knee joint, subsequent encounter: Secondary | ICD-10-CM | POA: Diagnosis not present

## 2016-02-07 DIAGNOSIS — N189 Chronic kidney disease, unspecified: Secondary | ICD-10-CM | POA: Diagnosis not present

## 2016-02-07 DIAGNOSIS — Z7901 Long term (current) use of anticoagulants: Secondary | ICD-10-CM | POA: Diagnosis not present

## 2016-02-07 DIAGNOSIS — Z79891 Long term (current) use of opiate analgesic: Secondary | ICD-10-CM | POA: Diagnosis not present

## 2016-02-07 DIAGNOSIS — Z9181 History of falling: Secondary | ICD-10-CM | POA: Diagnosis not present

## 2016-02-07 DIAGNOSIS — I129 Hypertensive chronic kidney disease with stage 1 through stage 4 chronic kidney disease, or unspecified chronic kidney disease: Secondary | ICD-10-CM | POA: Diagnosis not present

## 2016-02-13 DIAGNOSIS — Z7901 Long term (current) use of anticoagulants: Secondary | ICD-10-CM | POA: Diagnosis not present

## 2016-02-13 DIAGNOSIS — M9712XD Periprosthetic fracture around internal prosthetic left knee joint, subsequent encounter: Secondary | ICD-10-CM | POA: Diagnosis not present

## 2016-02-13 DIAGNOSIS — Z79891 Long term (current) use of opiate analgesic: Secondary | ICD-10-CM | POA: Diagnosis not present

## 2016-02-13 DIAGNOSIS — N189 Chronic kidney disease, unspecified: Secondary | ICD-10-CM | POA: Diagnosis not present

## 2016-02-13 DIAGNOSIS — I129 Hypertensive chronic kidney disease with stage 1 through stage 4 chronic kidney disease, or unspecified chronic kidney disease: Secondary | ICD-10-CM | POA: Diagnosis not present

## 2016-02-13 DIAGNOSIS — Z9181 History of falling: Secondary | ICD-10-CM | POA: Diagnosis not present

## 2016-02-15 DIAGNOSIS — I129 Hypertensive chronic kidney disease with stage 1 through stage 4 chronic kidney disease, or unspecified chronic kidney disease: Secondary | ICD-10-CM | POA: Diagnosis not present

## 2016-02-15 DIAGNOSIS — N189 Chronic kidney disease, unspecified: Secondary | ICD-10-CM | POA: Diagnosis not present

## 2016-02-15 DIAGNOSIS — Z7901 Long term (current) use of anticoagulants: Secondary | ICD-10-CM | POA: Diagnosis not present

## 2016-02-15 DIAGNOSIS — Z9181 History of falling: Secondary | ICD-10-CM | POA: Diagnosis not present

## 2016-02-15 DIAGNOSIS — Z79891 Long term (current) use of opiate analgesic: Secondary | ICD-10-CM | POA: Diagnosis not present

## 2016-02-15 DIAGNOSIS — M9712XD Periprosthetic fracture around internal prosthetic left knee joint, subsequent encounter: Secondary | ICD-10-CM | POA: Diagnosis not present

## 2016-02-18 DIAGNOSIS — N189 Chronic kidney disease, unspecified: Secondary | ICD-10-CM | POA: Diagnosis not present

## 2016-02-18 DIAGNOSIS — Z7901 Long term (current) use of anticoagulants: Secondary | ICD-10-CM | POA: Diagnosis not present

## 2016-02-18 DIAGNOSIS — M9712XD Periprosthetic fracture around internal prosthetic left knee joint, subsequent encounter: Secondary | ICD-10-CM | POA: Diagnosis not present

## 2016-02-18 DIAGNOSIS — I129 Hypertensive chronic kidney disease with stage 1 through stage 4 chronic kidney disease, or unspecified chronic kidney disease: Secondary | ICD-10-CM | POA: Diagnosis not present

## 2016-02-18 DIAGNOSIS — Z79891 Long term (current) use of opiate analgesic: Secondary | ICD-10-CM | POA: Diagnosis not present

## 2016-02-18 DIAGNOSIS — Z9181 History of falling: Secondary | ICD-10-CM | POA: Diagnosis not present

## 2016-02-20 DIAGNOSIS — Z9181 History of falling: Secondary | ICD-10-CM | POA: Diagnosis not present

## 2016-02-20 DIAGNOSIS — I129 Hypertensive chronic kidney disease with stage 1 through stage 4 chronic kidney disease, or unspecified chronic kidney disease: Secondary | ICD-10-CM | POA: Diagnosis not present

## 2016-02-20 DIAGNOSIS — Z7901 Long term (current) use of anticoagulants: Secondary | ICD-10-CM | POA: Diagnosis not present

## 2016-02-20 DIAGNOSIS — N189 Chronic kidney disease, unspecified: Secondary | ICD-10-CM | POA: Diagnosis not present

## 2016-02-20 DIAGNOSIS — Z79891 Long term (current) use of opiate analgesic: Secondary | ICD-10-CM | POA: Diagnosis not present

## 2016-02-20 DIAGNOSIS — M9712XD Periprosthetic fracture around internal prosthetic left knee joint, subsequent encounter: Secondary | ICD-10-CM | POA: Diagnosis not present

## 2016-02-21 DIAGNOSIS — E785 Hyperlipidemia, unspecified: Secondary | ICD-10-CM | POA: Diagnosis not present

## 2016-02-21 DIAGNOSIS — I480 Paroxysmal atrial fibrillation: Secondary | ICD-10-CM | POA: Diagnosis not present

## 2016-02-21 DIAGNOSIS — I5032 Chronic diastolic (congestive) heart failure: Secondary | ICD-10-CM | POA: Diagnosis not present

## 2016-02-22 DIAGNOSIS — N189 Chronic kidney disease, unspecified: Secondary | ICD-10-CM | POA: Diagnosis not present

## 2016-02-22 DIAGNOSIS — Z9181 History of falling: Secondary | ICD-10-CM | POA: Diagnosis not present

## 2016-02-22 DIAGNOSIS — Z79891 Long term (current) use of opiate analgesic: Secondary | ICD-10-CM | POA: Diagnosis not present

## 2016-02-22 DIAGNOSIS — M9712XD Periprosthetic fracture around internal prosthetic left knee joint, subsequent encounter: Secondary | ICD-10-CM | POA: Diagnosis not present

## 2016-02-22 DIAGNOSIS — Z7901 Long term (current) use of anticoagulants: Secondary | ICD-10-CM | POA: Diagnosis not present

## 2016-02-22 DIAGNOSIS — I129 Hypertensive chronic kidney disease with stage 1 through stage 4 chronic kidney disease, or unspecified chronic kidney disease: Secondary | ICD-10-CM | POA: Diagnosis not present

## 2016-02-26 DIAGNOSIS — I129 Hypertensive chronic kidney disease with stage 1 through stage 4 chronic kidney disease, or unspecified chronic kidney disease: Secondary | ICD-10-CM | POA: Diagnosis not present

## 2016-02-26 DIAGNOSIS — Z9181 History of falling: Secondary | ICD-10-CM | POA: Diagnosis not present

## 2016-02-26 DIAGNOSIS — N189 Chronic kidney disease, unspecified: Secondary | ICD-10-CM | POA: Diagnosis not present

## 2016-02-26 DIAGNOSIS — M9712XD Periprosthetic fracture around internal prosthetic left knee joint, subsequent encounter: Secondary | ICD-10-CM | POA: Diagnosis not present

## 2016-02-26 DIAGNOSIS — Z7901 Long term (current) use of anticoagulants: Secondary | ICD-10-CM | POA: Diagnosis not present

## 2016-02-26 DIAGNOSIS — Z79891 Long term (current) use of opiate analgesic: Secondary | ICD-10-CM | POA: Diagnosis not present

## 2016-02-27 DIAGNOSIS — N189 Chronic kidney disease, unspecified: Secondary | ICD-10-CM | POA: Diagnosis not present

## 2016-02-27 DIAGNOSIS — M9712XD Periprosthetic fracture around internal prosthetic left knee joint, subsequent encounter: Secondary | ICD-10-CM | POA: Diagnosis not present

## 2016-02-27 DIAGNOSIS — Z9181 History of falling: Secondary | ICD-10-CM | POA: Diagnosis not present

## 2016-02-27 DIAGNOSIS — I129 Hypertensive chronic kidney disease with stage 1 through stage 4 chronic kidney disease, or unspecified chronic kidney disease: Secondary | ICD-10-CM | POA: Diagnosis not present

## 2016-02-27 DIAGNOSIS — Z79891 Long term (current) use of opiate analgesic: Secondary | ICD-10-CM | POA: Diagnosis not present

## 2016-02-27 DIAGNOSIS — Z7901 Long term (current) use of anticoagulants: Secondary | ICD-10-CM | POA: Diagnosis not present

## 2016-02-29 DIAGNOSIS — I129 Hypertensive chronic kidney disease with stage 1 through stage 4 chronic kidney disease, or unspecified chronic kidney disease: Secondary | ICD-10-CM | POA: Diagnosis not present

## 2016-02-29 DIAGNOSIS — N189 Chronic kidney disease, unspecified: Secondary | ICD-10-CM | POA: Diagnosis not present

## 2016-02-29 DIAGNOSIS — Z9181 History of falling: Secondary | ICD-10-CM | POA: Diagnosis not present

## 2016-02-29 DIAGNOSIS — M9712XD Periprosthetic fracture around internal prosthetic left knee joint, subsequent encounter: Secondary | ICD-10-CM | POA: Diagnosis not present

## 2016-02-29 DIAGNOSIS — Z79891 Long term (current) use of opiate analgesic: Secondary | ICD-10-CM | POA: Diagnosis not present

## 2016-02-29 DIAGNOSIS — Z7901 Long term (current) use of anticoagulants: Secondary | ICD-10-CM | POA: Diagnosis not present

## 2016-03-04 DIAGNOSIS — Z7901 Long term (current) use of anticoagulants: Secondary | ICD-10-CM | POA: Diagnosis not present

## 2016-03-04 DIAGNOSIS — I129 Hypertensive chronic kidney disease with stage 1 through stage 4 chronic kidney disease, or unspecified chronic kidney disease: Secondary | ICD-10-CM | POA: Diagnosis not present

## 2016-03-04 DIAGNOSIS — Z9181 History of falling: Secondary | ICD-10-CM | POA: Diagnosis not present

## 2016-03-04 DIAGNOSIS — N189 Chronic kidney disease, unspecified: Secondary | ICD-10-CM | POA: Diagnosis not present

## 2016-03-04 DIAGNOSIS — M9712XD Periprosthetic fracture around internal prosthetic left knee joint, subsequent encounter: Secondary | ICD-10-CM | POA: Diagnosis not present

## 2016-03-04 DIAGNOSIS — Z79891 Long term (current) use of opiate analgesic: Secondary | ICD-10-CM | POA: Diagnosis not present

## 2016-03-05 DIAGNOSIS — M978XXA Periprosthetic fracture around other internal prosthetic joint, initial encounter: Secondary | ICD-10-CM | POA: Diagnosis not present

## 2016-03-06 DIAGNOSIS — Z7901 Long term (current) use of anticoagulants: Secondary | ICD-10-CM | POA: Diagnosis not present

## 2016-03-06 DIAGNOSIS — Z9181 History of falling: Secondary | ICD-10-CM | POA: Diagnosis not present

## 2016-03-06 DIAGNOSIS — Z79891 Long term (current) use of opiate analgesic: Secondary | ICD-10-CM | POA: Diagnosis not present

## 2016-03-06 DIAGNOSIS — N189 Chronic kidney disease, unspecified: Secondary | ICD-10-CM | POA: Diagnosis not present

## 2016-03-06 DIAGNOSIS — M9712XD Periprosthetic fracture around internal prosthetic left knee joint, subsequent encounter: Secondary | ICD-10-CM | POA: Diagnosis not present

## 2016-03-06 DIAGNOSIS — I129 Hypertensive chronic kidney disease with stage 1 through stage 4 chronic kidney disease, or unspecified chronic kidney disease: Secondary | ICD-10-CM | POA: Diagnosis not present

## 2016-03-25 DIAGNOSIS — N3020 Other chronic cystitis without hematuria: Secondary | ICD-10-CM | POA: Diagnosis not present

## 2016-03-25 DIAGNOSIS — N1330 Unspecified hydronephrosis: Secondary | ICD-10-CM | POA: Diagnosis not present

## 2016-03-25 DIAGNOSIS — N952 Postmenopausal atrophic vaginitis: Secondary | ICD-10-CM | POA: Diagnosis not present

## 2016-03-25 DIAGNOSIS — N200 Calculus of kidney: Secondary | ICD-10-CM | POA: Diagnosis not present

## 2016-03-25 DIAGNOSIS — Z0000 Encounter for general adult medical examination without abnormal findings: Secondary | ICD-10-CM | POA: Diagnosis not present

## 2016-03-31 DIAGNOSIS — Z1231 Encounter for screening mammogram for malignant neoplasm of breast: Secondary | ICD-10-CM | POA: Diagnosis not present

## 2016-04-15 DIAGNOSIS — N815 Vaginal enterocele: Secondary | ICD-10-CM | POA: Insufficient documentation

## 2016-04-15 DIAGNOSIS — Z79899 Other long term (current) drug therapy: Secondary | ICD-10-CM

## 2016-04-15 DIAGNOSIS — R002 Palpitations: Secondary | ICD-10-CM

## 2016-04-15 DIAGNOSIS — N819 Female genital prolapse, unspecified: Secondary | ICD-10-CM

## 2016-04-15 DIAGNOSIS — E785 Hyperlipidemia, unspecified: Secondary | ICD-10-CM | POA: Insufficient documentation

## 2016-04-15 DIAGNOSIS — M7660 Achilles tendinitis, unspecified leg: Secondary | ICD-10-CM | POA: Insufficient documentation

## 2016-04-15 DIAGNOSIS — N8111 Cystocele, midline: Secondary | ICD-10-CM | POA: Insufficient documentation

## 2016-04-15 HISTORY — DX: Cystocele, midline: N81.11

## 2016-04-15 HISTORY — DX: Female genital prolapse, unspecified: N81.9

## 2016-04-15 HISTORY — DX: Vaginal enterocele: N81.5

## 2016-04-15 HISTORY — DX: Other long term (current) drug therapy: Z79.899

## 2016-04-15 HISTORY — DX: Hyperlipidemia, unspecified: E78.5

## 2016-04-15 HISTORY — DX: Palpitations: R00.2

## 2016-04-15 HISTORY — DX: Achilles tendinitis, unspecified leg: M76.60

## 2016-04-16 DIAGNOSIS — Z4689 Encounter for fitting and adjustment of other specified devices: Secondary | ICD-10-CM | POA: Diagnosis not present

## 2016-04-16 DIAGNOSIS — N8111 Cystocele, midline: Secondary | ICD-10-CM | POA: Diagnosis not present

## 2016-04-23 DIAGNOSIS — E785 Hyperlipidemia, unspecified: Secondary | ICD-10-CM | POA: Diagnosis not present

## 2016-04-23 DIAGNOSIS — I5032 Chronic diastolic (congestive) heart failure: Secondary | ICD-10-CM | POA: Diagnosis not present

## 2016-04-23 DIAGNOSIS — I480 Paroxysmal atrial fibrillation: Secondary | ICD-10-CM | POA: Diagnosis not present

## 2016-04-23 DIAGNOSIS — Z79899 Other long term (current) drug therapy: Secondary | ICD-10-CM | POA: Diagnosis not present

## 2016-04-23 DIAGNOSIS — Z6829 Body mass index (BMI) 29.0-29.9, adult: Secondary | ICD-10-CM | POA: Diagnosis not present

## 2016-05-07 ENCOUNTER — Encounter (HOSPITAL_COMMUNITY)
Admission: EM | Disposition: A | Discharge status: Home / Self Care | Payer: Self-pay | Source: Home / Self Care | Attending: Internal Medicine

## 2016-05-07 ENCOUNTER — Inpatient Hospital Stay (HOSPITAL_COMMUNITY): Payer: Medicare Other | Admitting: Anesthesiology

## 2016-05-07 ENCOUNTER — Emergency Department (HOSPITAL_COMMUNITY): Payer: Medicare Other

## 2016-05-07 ENCOUNTER — Inpatient Hospital Stay (HOSPITAL_COMMUNITY)
Admission: EM | Admit: 2016-05-07 | Discharge: 2016-05-09 | DRG: 872 | Disposition: A | Discharge status: Home / Self Care | Payer: Medicare Other | Attending: Internal Medicine | Admitting: Internal Medicine

## 2016-05-07 ENCOUNTER — Encounter (HOSPITAL_COMMUNITY): Payer: Self-pay | Admitting: Emergency Medicine

## 2016-05-07 DIAGNOSIS — I480 Paroxysmal atrial fibrillation: Secondary | ICD-10-CM | POA: Diagnosis present

## 2016-05-07 DIAGNOSIS — N1330 Unspecified hydronephrosis: Secondary | ICD-10-CM | POA: Diagnosis present

## 2016-05-07 DIAGNOSIS — N12 Tubulo-interstitial nephritis, not specified as acute or chronic: Secondary | ICD-10-CM | POA: Diagnosis present

## 2016-05-07 DIAGNOSIS — Z9049 Acquired absence of other specified parts of digestive tract: Secondary | ICD-10-CM

## 2016-05-07 DIAGNOSIS — R7881 Bacteremia: Secondary | ICD-10-CM | POA: Diagnosis not present

## 2016-05-07 DIAGNOSIS — K219 Gastro-esophageal reflux disease without esophagitis: Secondary | ICD-10-CM | POA: Diagnosis not present

## 2016-05-07 DIAGNOSIS — Z7901 Long term (current) use of anticoagulants: Secondary | ICD-10-CM

## 2016-05-07 DIAGNOSIS — Z96653 Presence of artificial knee joint, bilateral: Secondary | ICD-10-CM | POA: Diagnosis present

## 2016-05-07 DIAGNOSIS — Z809 Family history of malignant neoplasm, unspecified: Secondary | ICD-10-CM | POA: Diagnosis not present

## 2016-05-07 DIAGNOSIS — I10 Essential (primary) hypertension: Secondary | ICD-10-CM | POA: Diagnosis not present

## 2016-05-07 DIAGNOSIS — Z87442 Personal history of urinary calculi: Secondary | ICD-10-CM | POA: Diagnosis not present

## 2016-05-07 DIAGNOSIS — I4891 Unspecified atrial fibrillation: Secondary | ICD-10-CM | POA: Diagnosis present

## 2016-05-07 DIAGNOSIS — E785 Hyperlipidemia, unspecified: Secondary | ICD-10-CM | POA: Diagnosis not present

## 2016-05-07 DIAGNOSIS — R109 Unspecified abdominal pain: Secondary | ICD-10-CM | POA: Diagnosis not present

## 2016-05-07 DIAGNOSIS — F419 Anxiety disorder, unspecified: Secondary | ICD-10-CM | POA: Diagnosis present

## 2016-05-07 DIAGNOSIS — R110 Nausea: Secondary | ICD-10-CM | POA: Diagnosis not present

## 2016-05-07 DIAGNOSIS — N135 Crossing vessel and stricture of ureter without hydronephrosis: Secondary | ICD-10-CM | POA: Diagnosis present

## 2016-05-07 DIAGNOSIS — N189 Chronic kidney disease, unspecified: Secondary | ICD-10-CM | POA: Diagnosis not present

## 2016-05-07 DIAGNOSIS — Z90710 Acquired absence of both cervix and uterus: Secondary | ICD-10-CM

## 2016-05-07 DIAGNOSIS — Z881 Allergy status to other antibiotic agents status: Secondary | ICD-10-CM | POA: Diagnosis not present

## 2016-05-07 DIAGNOSIS — Z79899 Other long term (current) drug therapy: Secondary | ICD-10-CM

## 2016-05-07 DIAGNOSIS — Z8249 Family history of ischemic heart disease and other diseases of the circulatory system: Secondary | ICD-10-CM

## 2016-05-07 DIAGNOSIS — Z885 Allergy status to narcotic agent status: Secondary | ICD-10-CM | POA: Diagnosis not present

## 2016-05-07 DIAGNOSIS — I129 Hypertensive chronic kidney disease with stage 1 through stage 4 chronic kidney disease, or unspecified chronic kidney disease: Secondary | ICD-10-CM | POA: Diagnosis not present

## 2016-05-07 DIAGNOSIS — K5790 Diverticulosis of intestine, part unspecified, without perforation or abscess without bleeding: Secondary | ICD-10-CM | POA: Insufficient documentation

## 2016-05-07 DIAGNOSIS — A419 Sepsis, unspecified organism: Secondary | ICD-10-CM | POA: Diagnosis not present

## 2016-05-07 DIAGNOSIS — Z8542 Personal history of malignant neoplasm of other parts of uterus: Secondary | ICD-10-CM | POA: Diagnosis not present

## 2016-05-07 DIAGNOSIS — N200 Calculus of kidney: Secondary | ICD-10-CM | POA: Diagnosis not present

## 2016-05-07 DIAGNOSIS — B9620 Unspecified Escherichia coli [E. coli] as the cause of diseases classified elsewhere: Secondary | ICD-10-CM | POA: Diagnosis present

## 2016-05-07 DIAGNOSIS — I700 Atherosclerosis of aorta: Secondary | ICD-10-CM

## 2016-05-07 DIAGNOSIS — N111 Chronic obstructive pyelonephritis: Secondary | ICD-10-CM | POA: Diagnosis not present

## 2016-05-07 DIAGNOSIS — R000 Tachycardia, unspecified: Secondary | ICD-10-CM | POA: Diagnosis present

## 2016-05-07 DIAGNOSIS — R1031 Right lower quadrant pain: Secondary | ICD-10-CM | POA: Diagnosis not present

## 2016-05-07 DIAGNOSIS — N131 Hydronephrosis with ureteral stricture, not elsewhere classified: Secondary | ICD-10-CM | POA: Diagnosis not present

## 2016-05-07 HISTORY — DX: Atherosclerosis of aorta: I70.0

## 2016-05-07 HISTORY — DX: Unspecified hydronephrosis: N13.30

## 2016-05-07 HISTORY — PX: CYSTOSCOPY WITH STENT PLACEMENT: SHX5790

## 2016-05-07 HISTORY — DX: Sepsis, unspecified organism: A41.9

## 2016-05-07 HISTORY — DX: Diverticulosis of intestine, part unspecified, without perforation or abscess without bleeding: K57.90

## 2016-05-07 LAB — BASIC METABOLIC PANEL
Anion gap: 11 (ref 5–15)
BUN: 17 mg/dL (ref 6–20)
CO2: 22 mmol/L (ref 22–32)
Calcium: 9.5 mg/dL (ref 8.9–10.3)
Chloride: 104 mmol/L (ref 101–111)
Creatinine, Ser: 1.16 mg/dL — ABNORMAL HIGH (ref 0.44–1.00)
GFR calc Af Amer: 52 mL/min — ABNORMAL LOW (ref >60)
GFR calc non Af Amer: 45 mL/min — ABNORMAL LOW (ref >60)
Glucose, Bld: 109 mg/dL — ABNORMAL HIGH (ref 65–99)
Potassium: 3.8 mmol/L (ref 3.5–5.1)
Sodium: 137 mmol/L (ref 135–145)

## 2016-05-07 LAB — CBC WITH DIFFERENTIAL/PLATELET
Basophils Absolute: 0.1 10*3/uL (ref 0.0–0.1)
Basophils Relative: 0 %
Eosinophils Absolute: 0.2 10*3/uL (ref 0.0–0.7)
Eosinophils Relative: 1 %
HCT: 45.8 % (ref 36.0–46.0)
Hemoglobin: 14.9 g/dL (ref 12.0–15.0)
Lymphocytes Relative: 9 %
Lymphs Abs: 1.7 10*3/uL (ref 0.7–4.0)
MCH: 24.5 pg — ABNORMAL LOW (ref 26.0–34.0)
MCHC: 32.5 g/dL (ref 30.0–36.0)
MCV: 75.2 fL — ABNORMAL LOW (ref 78.0–100.0)
Monocytes Absolute: 2 10*3/uL — ABNORMAL HIGH (ref 0.1–1.0)
Monocytes Relative: 11 %
Neutro Abs: 14.2 10*3/uL — ABNORMAL HIGH (ref 1.7–7.7)
Neutrophils Relative %: 79 %
Platelets: 603 10*3/uL — ABNORMAL HIGH (ref 150–400)
RBC: 6.09 MIL/uL — ABNORMAL HIGH (ref 3.87–5.11)
RDW: 16.3 % — ABNORMAL HIGH (ref 11.5–15.5)
WBC: 18.1 10*3/uL — ABNORMAL HIGH (ref 4.0–10.5)

## 2016-05-07 LAB — URINALYSIS, ROUTINE W REFLEX MICROSCOPIC
Bilirubin Urine: NEGATIVE
Glucose, UA: NEGATIVE mg/dL
Ketones, ur: NEGATIVE mg/dL
Nitrite: POSITIVE — AB
Protein, ur: 100 mg/dL — AB
Specific Gravity, Urine: 1.017 (ref 1.005–1.030)
pH: 6 (ref 5.0–8.0)

## 2016-05-07 LAB — URINE MICROSCOPIC-ADD ON

## 2016-05-07 LAB — I-STAT CG4 LACTIC ACID, ED
Lactic Acid, Venous: 1.24 mmol/L (ref 0.5–1.9)
Lactic Acid, Venous: 1.51 mmol/L (ref 0.5–1.9)

## 2016-05-07 LAB — HEPARIN LEVEL (UNFRACTIONATED): Heparin Unfractionated: 1.96 IU/mL — ABNORMAL HIGH (ref 0.30–0.70)

## 2016-05-07 LAB — APTT: aPTT: 29 seconds (ref 24–37)

## 2016-05-07 LAB — MRSA PCR SCREENING: MRSA by PCR: NEGATIVE

## 2016-05-07 SURGERY — CYSTOSCOPY, WITH STENT INSERTION
Anesthesia: General | Site: Ureter | Laterality: Right

## 2016-05-07 SURGERY — CYSTOSCOPY, WITH RETROGRADE PYELOGRAM AND URETERAL STENT INSERTION
Anesthesia: Choice | Laterality: Right

## 2016-05-07 MED ORDER — LACTATED RINGERS IV SOLN: INTRAVENOUS | Status: DC | PRN

## 2016-05-07 MED ORDER — AMITRIPTYLINE HCL 25 MG PO TABS
25.0000 mg | ORAL_TABLET | Freq: Every day | ORAL | Status: DC
  Administered 2016-05-07 – 2016-05-08 (×2): 25 mg via ORAL
  Filled 2016-05-07 (×2): qty 1

## 2016-05-07 MED ORDER — FENTANYL CITRATE (PF) 100 MCG/2ML IJ SOLN
INTRAMUSCULAR | Status: AC
  Filled 2016-05-07: qty 2

## 2016-05-07 MED ORDER — ONDANSETRON HCL 4 MG/2ML IJ SOLN
4.0000 mg | Freq: Once | INTRAMUSCULAR | Status: AC
  Administered 2016-05-07: 4 mg via INTRAVENOUS
  Filled 2016-05-07: qty 2

## 2016-05-07 MED ORDER — ONDANSETRON HCL 4 MG/2ML IJ SOLN
INTRAMUSCULAR | Status: DC | PRN
  Administered 2016-05-07: 4 mg via INTRAVENOUS

## 2016-05-07 MED ORDER — FENTANYL CITRATE (PF) 100 MCG/2ML IJ SOLN: 12.5000 ug | INTRAMUSCULAR | Status: DC | PRN

## 2016-05-07 MED ORDER — PROMETHAZINE HCL 25 MG/ML IJ SOLN
12.5000 mg | Freq: Once | INTRAMUSCULAR | Status: AC
  Administered 2016-05-07: 12.5 mg via INTRAVENOUS
  Filled 2016-05-07: qty 1

## 2016-05-07 MED ORDER — PROPOFOL 10 MG/ML IV BOLUS
INTRAVENOUS | Status: DC | PRN
  Administered 2016-05-07: 100 mg via INTRAVENOUS

## 2016-05-07 MED ORDER — DEXTROSE 5 % IV SOLN
2.0000 g | Freq: Once | INTRAVENOUS | Status: AC
  Administered 2016-05-07: 2 g via INTRAVENOUS
  Filled 2016-05-07: qty 2

## 2016-05-07 MED ORDER — SODIUM CHLORIDE 0.9% FLUSH
3.0000 mL | Freq: Two times a day (BID) | INTRAVENOUS | Status: DC
  Administered 2016-05-07 – 2016-05-09 (×3): 3 mL via INTRAVENOUS

## 2016-05-07 MED ORDER — PROPOFOL 10 MG/ML IV BOLUS
INTRAVENOUS | Status: AC
  Filled 2016-05-07: qty 40

## 2016-05-07 MED ORDER — PROPOFOL 10 MG/ML IV BOLUS
INTRAVENOUS | Status: AC
  Filled 2016-05-07: qty 20

## 2016-05-07 MED ORDER — PHENYLEPHRINE HCL 10 MG/ML IJ SOLN
INTRAMUSCULAR | Status: DC | PRN
  Administered 2016-05-07 (×2): 80 ug via INTRAVENOUS

## 2016-05-07 MED ORDER — LACTATED RINGERS IV SOLN
INTRAVENOUS | Status: DC | PRN
  Administered 2016-05-07: 19:00:00 via INTRAVENOUS

## 2016-05-07 MED ORDER — LIDOCAINE HCL 1 % IJ SOLN
INTRAMUSCULAR | Status: AC
  Filled 2016-05-07: qty 100

## 2016-05-07 MED ORDER — PHENYLEPHRINE HCL 10 MG/ML IJ SOLN
INTRAMUSCULAR | Status: AC
  Filled 2016-05-07: qty 1

## 2016-05-07 MED ORDER — IOHEXOL 300 MG/ML  SOLN
INTRAMUSCULAR | Status: DC | PRN
  Administered 2016-05-07: 14 mL

## 2016-05-07 MED ORDER — ACETAMINOPHEN 10 MG/ML IV SOLN
1000.0000 mg | Freq: Four times a day (QID) | INTRAVENOUS | Status: DC
  Administered 2016-05-07 – 2016-05-08 (×3): 1000 mg via INTRAVENOUS
  Filled 2016-05-07 (×5): qty 100

## 2016-05-07 MED ORDER — DEXTROSE 5 % IV SOLN
1.0000 g | INTRAVENOUS | Status: DC
  Administered 2016-05-08 – 2016-05-09 (×2): 1 g via INTRAVENOUS
  Filled 2016-05-07 (×3): qty 10

## 2016-05-07 MED ORDER — STERILE WATER FOR IRRIGATION IR SOLN
Status: DC | PRN
  Administered 2016-05-07: 3000 mL

## 2016-05-07 MED ORDER — DILTIAZEM HCL ER COATED BEADS 240 MG PO CP24
240.0000 mg | ORAL_CAPSULE | Freq: Every day | ORAL | Status: DC
  Filled 2016-05-07: qty 1

## 2016-05-07 MED ORDER — SODIUM CHLORIDE 0.9 % IV BOLUS (SEPSIS)
1000.0000 mL | Freq: Once | INTRAVENOUS | Status: AC
  Administered 2016-05-07: 1000 mL via INTRAVENOUS

## 2016-05-07 MED ORDER — LIDOCAINE HCL (CARDIAC) 20 MG/ML IV SOLN
INTRAVENOUS | Status: AC
  Filled 2016-05-07: qty 5

## 2016-05-07 MED ORDER — FLECAINIDE ACETATE 50 MG PO TABS
50.0000 mg | ORAL_TABLET | Freq: Two times a day (BID) | ORAL | Status: DC
  Administered 2016-05-07 – 2016-05-09 (×4): 50 mg via ORAL
  Filled 2016-05-07 (×4): qty 1

## 2016-05-07 MED ORDER — SODIUM CHLORIDE 0.9 % IV SOLN
INTRAVENOUS | Status: DC
  Administered 2016-05-07 – 2016-05-08 (×2): via INTRAVENOUS

## 2016-05-07 MED ORDER — LIP MEDEX EX OINT
TOPICAL_OINTMENT | CUTANEOUS | Status: AC
  Filled 2016-05-07: qty 7

## 2016-05-07 MED ORDER — MORPHINE SULFATE (PF) 4 MG/ML IV SOLN
4.0000 mg | Freq: Once | INTRAVENOUS | Status: AC
  Administered 2016-05-07: 4 mg via INTRAVENOUS
  Filled 2016-05-07: qty 1

## 2016-05-07 MED ORDER — FLECAINIDE ACETATE 50 MG PO TABS
50.0000 mg | ORAL_TABLET | Freq: Two times a day (BID) | ORAL | Status: DC
  Filled 2016-05-07: qty 1

## 2016-05-07 MED ORDER — LORAZEPAM 2 MG/ML IJ SOLN
1.0000 mg | Freq: Once | INTRAMUSCULAR | Status: AC
  Administered 2016-05-07: 1 mg via INTRAVENOUS
  Filled 2016-05-07: qty 1

## 2016-05-07 MED ORDER — SUCCINYLCHOLINE CHLORIDE 20 MG/ML IJ SOLN
INTRAMUSCULAR | Status: DC | PRN
  Administered 2016-05-07: 100 mg via INTRAVENOUS

## 2016-05-07 MED ORDER — IOPAMIDOL (ISOVUE-300) INJECTION 61%
100.0000 mL | Freq: Once | INTRAVENOUS | Status: AC | PRN
  Administered 2016-05-07: 100 mL via INTRAVENOUS

## 2016-05-07 MED ORDER — ONDANSETRON HCL 4 MG/2ML IJ SOLN
INTRAMUSCULAR | Status: AC
  Filled 2016-05-07: qty 2

## 2016-05-07 MED ORDER — FENTANYL CITRATE (PF) 100 MCG/2ML IJ SOLN
50.0000 ug | Freq: Once | INTRAMUSCULAR | Status: AC
  Administered 2016-05-07: 50 ug via INTRAVENOUS
  Filled 2016-05-07: qty 2

## 2016-05-07 MED ORDER — FENTANYL CITRATE (PF) 100 MCG/2ML IJ SOLN
INTRAMUSCULAR | Status: DC | PRN
  Administered 2016-05-07: 100 ug via INTRAVENOUS

## 2016-05-07 MED ORDER — HEPARIN (PORCINE) IN NACL 100-0.45 UNIT/ML-% IJ SOLN
1100.0000 [IU]/h | INTRAMUSCULAR | Status: DC
  Administered 2016-05-07: 1100 [IU]/h via INTRAVENOUS
  Filled 2016-05-07: qty 250

## 2016-05-07 MED ORDER — HYDROMORPHONE HCL 1 MG/ML IJ SOLN
1.0000 mg | Freq: Once | INTRAMUSCULAR | Status: AC
  Administered 2016-05-07: 1 mg via INTRAVENOUS
  Filled 2016-05-07: qty 1

## 2016-05-07 MED ORDER — ACETAMINOPHEN 10 MG/ML IV SOLN
INTRAVENOUS | Status: AC
  Filled 2016-05-07: qty 100

## 2016-05-07 MED ORDER — CARVEDILOL 3.125 MG PO TABS
3.1250 mg | ORAL_TABLET | Freq: Two times a day (BID) | ORAL | Status: DC
  Filled 2016-05-07 (×2): qty 1

## 2016-05-07 MED ORDER — LACTATED RINGERS IV SOLN: INTRAVENOUS | Status: DC

## 2016-05-07 MED ORDER — DILTIAZEM HCL 100 MG IV SOLR
5.0000 mg/h | INTRAVENOUS | Status: AC
  Administered 2016-05-07: 10 mg/h via INTRAVENOUS
  Administered 2016-05-07: 5 mg/h via INTRAVENOUS
  Filled 2016-05-07 (×2): qty 100

## 2016-05-07 MED ORDER — PROMETHAZINE HCL 25 MG/ML IJ SOLN
12.5000 mg | Freq: Three times a day (TID) | INTRAMUSCULAR | Status: DC | PRN
  Administered 2016-05-07: 12.5 mg via INTRAVENOUS
  Filled 2016-05-07: qty 1

## 2016-05-07 SURGICAL SUPPLY — 13 items
BAG URO CATCHER STRL LF (MISCELLANEOUS) ×2 IMPLANT
CATH INTERMIT  6FR 70CM (CATHETERS) ×2 IMPLANT
CLOTH BEACON ORANGE TIMEOUT ST (SAFETY) ×2 IMPLANT
GLOVE BIOGEL M 8.0 STRL (GLOVE) ×2 IMPLANT
GOWN STRL REUS W/ TWL XL LVL3 (GOWN DISPOSABLE) ×1 IMPLANT
GOWN STRL REUS W/TWL LRG LVL3 (GOWN DISPOSABLE) ×2 IMPLANT
GOWN STRL REUS W/TWL XL LVL3 (GOWN DISPOSABLE) ×1
GUIDEWIRE ANG ZIPWIRE 038X150 (WIRE) ×2 IMPLANT
GUIDEWIRE STR DUAL SENSOR (WIRE) ×2 IMPLANT
MANIFOLD NEPTUNE II (INSTRUMENTS) ×2 IMPLANT
PACK CYSTO (CUSTOM PROCEDURE TRAY) ×2 IMPLANT
STENT URET 6FRX24 CONTOUR (STENTS) ×2 IMPLANT
TUBING CONNECTING 10 (TUBING) ×2 IMPLANT

## 2016-05-07 NOTE — Consult Note (Signed)
Urology Consult   Physician requesting consult: Dr. Florencia Reasons  Reason for consult: right hydronephrosis  History of Present Illness: [name] [name] is a 75 y.o. with history of right hydronephrosis due to right ureteral stricture and nephrolithiasis requiring multiple prior stone procedures on right including SWL, ureteroscopy, and neph tube. She presented today with acute onset of right flank pain in addition to nausea and dry heaving without vomiting. Her labs were significant for WBC of 18K but normal lactate. Her creatinine was at baseline of 1.1-1.2. A CT scan was performed which demonstrated moderate right hydronephrosis down to mid ureter without any nephrolithiasis. She was started on Ceftriaxone. Cardiology was consulted and felt that she was low to medium risk for surgery but recommended against fluoroquinolones. She has been followed by Dr. Tresa Moore after concern for high grade distal stricture in 05/2013 on antegrade nephrostogram during episode of urosepsis treated with neph tube. She has had several ureteroscopies, open ureterolithotomy on Left, SWL on right x2. She has not had issues with stones since then.   Formal diagnostic ureteroscopy 02/2014 with several areas of relative narrowing (distal and proximal) but NO frank stricture (accomodated 59F sheath to UPJ). She did have a renogram in September 2014 that showed Lt 61% / Rt 39% function; Lt T1/2 39min, Rt T1/2 34 min (partial obstruction at upper mid ureter, not UPJ) with no symptoms during the procedure. Her right hydronephrosis has actually been improving with imaging history as follows:   03/2014 Renal US - mod rt hydro, max pox ureteral diameter 1.8cm. 12/2014 Renal US - mod rt hydro, max renal pelvis diameter 1.8cm, max prox ureter 0.75cm 02/2016 Renal US - mild rt hydro, max renal pelvis diameter 1.4 cm, max prox ureter 0.32cm. STone free. Stable Lt non-complex cysts.      Past Medical History  Diagnosis Date  . GERD  (gastroesophageal reflux disease)   . Hyperlipidemia   . History of kidney stones   . History of septic shock     2011 and 06-04-2013  w/ acute renal failure  . PAF (paroxysmal atrial fibrillation) [facility]) DX JUNE 2012    CARDIOLOGIST--  DR KRASOWSKI ([facility] , [place])  CHADS2  . Hypertension   . History of uterine cancer     STAGE I  --  S/P TAH W/ BSO  (NO OTHER TX)  . Wears glasses   . Ureteral stricture, right     Past Surgical History  Procedure Laterality Date  . Cysto/  bilateral retrograde pyelogram/ right ureteroscopy and stent placement  10-04-2005  . Cysto/ left ureteroscopic stone extraction  04-03-2006  . Cysto/  balloon dilation right ureteral stricture/ stent placement  06-02-2013  . Transthoracic echocardiogram  06-07-2013    LVSF 45-50% /  MILD DILATED LA  . Lumbar fusion  JULY 2013  . Cholecystectomy  2010  . Nephrolithotomy  X2 YRS AGO  . Total knee arthroplasty Bilateral 1992  &  1996  . Total abdominal hysterectomy w/ bilateral salpingoophorectomy  1989  . Cystoscopy with litholapaxy N/A 11/21/2013    Procedure: CYSTOSCOPY WITH LITHOLAPAXY;  Surgeon: Ailene Rud, MD;  Location: [facility];  Service: Urology;  Laterality: N/A;  . Ureteroscopy Right 11/21/2013    Procedure: URETEROSCOPY WITH STENT REMOVAL AND REPLACEMENT;  Surgeon: Ailene Rud, MD;  Location: [facility];  Service: Urology;  Laterality: Right;  . Cystoscopy w/ ureteral stent placement Right 02/06/2014    Procedure: CYSTOSCOPY WITH RIGHT RETROGRADE PYELOGRAM RIGHT  STENT REMOVAL and REPLACEMENT, Right Ureteroscopy;  Surgeon: Ailene Rud, MD;  Location: [facility];  Service: Urology;  Laterality: Right;  . Cystoscopy with retrograde pyelogram, ureteroscopy and stent placement Bilateral 03/15/2014    Procedure: CYSTOSCOPY WITH BILATERAL RETROGRADE PYELOGRAM, RIGHT DIAGNOSTIC URETEROSCOPY AND STENT EXCHANGE;  Surgeon:  Alexis Frock, MD;  Location: [facility];  Service: Urology;  Laterality: Bilateral;     Current Hospital Medications:  Home meds:    Medication List    ASK your doctor about these medications        amitriptyline 25 MG tablet  Commonly known as:  ELAVIL  Take 25 mg by mouth at bedtime.     apixaban 5 MG Tabs tablet  Commonly known as:  ELIQUIS  Take 5 mg by mouth 2 (two) times daily.     carvedilol 6.25 MG tablet  Commonly known as:  COREG  Take 1 tablet (6.25 mg total) by mouth 2 (two) times daily with a meal.     diltiazem 240 MG 24 hr capsule  Commonly known as:  CARDIZEM CD  Take 240 mg by mouth daily.     fenofibrate micronized 134 MG capsule  Commonly known as:  LOFIBRA  Take 134 mg by mouth daily before breakfast.     flecainide 50 MG tablet  Commonly known as:  TAMBOCOR  Take 50 mg by mouth 2 (two) times daily.     furosemide 40 MG tablet  Commonly known as:  LASIX  Take 40 mg by mouth daily.     omeprazole 20 MG capsule  Commonly known as:  PRILOSEC  Take 20 mg by mouth every morning.     potassium chloride 10 MEQ tablet  Commonly known as:  K-DUR,KLOR-CON  Take 10 mEq by mouth daily.        Scheduled Meds: . [START ON 05/08/2016] cefTRIAXone (ROCEPHIN)  IV  1 g Intravenous Q24H  . flecainide  50 mg Oral BID   Continuous Infusions: . sodium chloride 75 mL/hr at 05/07/16 1548  . diltiazem (CARDIZEM) infusion 10 mg/hr (05/07/16 1550)  . heparin 1,100 Units/hr (05/07/16 1548)   PRN Meds:.fentaNYL (SUBLIMAZE) injection, promethazine  Allergies:  Allergies  Allergen Reactions  . Codeine Nausea Only and Other (See Comments)    hallucinations  . Hydrocodone Nausea Only and Other (See Comments)  . Macrodantin [Nitrofurantoin] Nausea And Vomiting    Family History  Problem Relation Age of Onset  . Heart disease Mother   . Heart disease Father   . Heart failure Mother   . Heart failure Father   . Cancer Mother     Social History:  reports  that she has never smoked. She has never used smokeless tobacco. She reports that she does not drink alcohol or use illicit drugs.  ROS: A complete review of systems was performed.  All systems are negative except for pertinent findings as noted.  Physical Exam:  Vital signs in last 24 hours: Temp:  [98.5 F (36.9 C)] 98.5 F (36.9 C) (07/12 0543) Pulse Rate:  [61-138] 118 (07/12 1500) Resp:  [16-33] 20 (07/12 1500) BP: (109-140)/(67-106) 135/106 mmHg (07/12 1500) SpO2:  [87 %-100 %] 92 % (07/12 1500) Weight:  [87.998 kg (194 lb)] 87.998 kg (194 lb) (07/12 0543) Constitutional:  Alert and oriented, appears uncomfortable Cardiovascular: Irregular rate and rhythm, normal peripheral perfusion Respiratory: Normal respiratory effort  GI: Abdomen is soft, tender to palpation in RUQ and RLQ, nondistended, no abdominal masses GU: Mild CVA  tenderness Lymphatic: No lymphadenopathy Neurologic: Grossly intact, no focal deficits Psychiatric: Normal mood and affect  Laboratory Data:   Recent Labs  05/07/16 0646  WBC 18.1*  HGB 14.9  HCT 45.8  PLT 603*     Recent Labs  05/07/16 0646  NA 137  K 3.8  CL 104  GLUCOSE 109*  BUN 17  CALCIUM 9.5  CREATININE 1.16*     Results for orders placed or performed during the hospital encounter of 05/07/16 (from the past 24 hour(s))  CBC with Differential/Platelet     Status: Abnormal   Collection Time: 05/07/16  6:46 AM  Result Value Ref Range   WBC 18.1 (H) 4.0 - 10.5 K/uL   RBC 6.09 (H) 3.87 - 5.11 MIL/uL   Hemoglobin 14.9 12.0 - 15.0 g/dL   HCT 45.8 36.0 - 46.0 %   MCV 75.2 (L) 78.0 - 100.0 fL   MCH 24.5 (L) 26.0 - 34.0 pg   MCHC 32.5 30.0 - 36.0 g/dL   RDW 16.3 (H) 11.5 - 15.5 %   Platelets 603 (H) 150 - 400 K/uL   Neutrophils Relative % 79 %   Neutro Abs 14.2 (H) 1.7 - 7.7 K/uL   Lymphocytes Relative 9 %   Lymphs Abs 1.7 0.7 - 4.0 K/uL   Monocytes Relative 11 %   Monocytes Absolute 2.0 (H) 0.1 - 1.0 K/uL   Eosinophils  Relative 1 %   Eosinophils Absolute 0.2 0.0 - 0.7 K/uL   Basophils Relative 0 %   Basophils Absolute 0.1 0.0 - 0.1 K/uL  Basic metabolic panel     Status: Abnormal   Collection Time: 05/07/16  6:46 AM  Result Value Ref Range   Sodium 137 135 - 145 mmol/L   Potassium 3.8 3.5 - 5.1 mmol/L   Chloride 104 101 - 111 mmol/L   CO2 22 22 - 32 mmol/L   Glucose, Bld 109 (H) 65 - 99 mg/dL   BUN 17 6 - 20 mg/dL   Creatinine, Ser 1.16 (H) 0.44 - 1.00 mg/dL   Calcium 9.5 8.9 - 10.3 mg/dL   GFR calc non Af Amer 45 (L) >60 mL/min   GFR calc Af Amer 52 (L) >60 mL/min   Anion gap 11 5 - 15  Urinalysis, Routine w reflex microscopic (not at [facility])     Status: Abnormal   Collection Time: 05/07/16  8:10 AM  Result Value Ref Range   Color, Urine YELLOW YELLOW   APPearance TURBID (A) CLEAR   Specific Gravity, Urine 1.017 1.005 - 1.030   pH 6.0 5.0 - 8.0   Glucose, UA NEGATIVE NEGATIVE mg/dL   Hgb urine dipstick LARGE (A) NEGATIVE   Bilirubin Urine NEGATIVE NEGATIVE   Ketones, ur NEGATIVE NEGATIVE mg/dL   Protein, ur 100 (A) NEGATIVE mg/dL   Nitrite POSITIVE (A) NEGATIVE   Leukocytes, UA LARGE (A) NEGATIVE  Urine microscopic-add on     Status: Abnormal   Collection Time: 05/07/16  8:10 AM  Result Value Ref Range   Squamous Epithelial / LPF 0-5 (A) NONE SEEN   WBC, UA TOO NUMEROUS TO COUNT 0 - 5 WBC/hpf   RBC / HPF 6-30 0 - 5 RBC/hpf   Bacteria, UA MANY (A) NONE SEEN  I-Stat CG4 Lactic Acid, ED     Status: None   Collection Time: 05/07/16  9:45 AM  Result Value Ref Range   Lactic Acid, Venous 1.24 0.5 - 1.9 mmol/L  I-Stat CG4 Lactic Acid, ED  (not at  [facility])     Status: None   Collection Time: 05/07/16  1:16 PM  Result Value Ref Range   Lactic Acid, Venous 1.51 0.5 - 1.9 mmol/L  Heparin level (unfractionated)     Status: Abnormal   Collection Time: 05/07/16  1:57 PM  Result Value Ref Range   Heparin Unfractionated 1.96 (H) 0.30 - 0.70 IU/mL  APTT     Status: None   Collection Time: 05/07/16   1:57 PM  Result Value Ref Range   aPTT 29 24 - 37 seconds   No results found for this or any previous visit (from the past 240 hour(s)).  Renal Function:  Recent Labs  05/07/16 0646  CREATININE 1.16*   Estimated Creatinine Clearance: 48.5 mL/min (by C-G formula based on Cr of 1.16).  Radiologic Imaging: Ct Abdomen Pelvis W Contrast  05/07/2016  CLINICAL DATA:  Rt flank pain since early am. H/o stones, prev surg for stones, elevated WBC, ovarian ca 1989 with hysterectomy. Prev GB surg Not a stone protocol per Dr Tran^124mL ISOVUE-300 IOPAMIDOL (ISOVUE-300) INJECTION 61% EXAM: CT ABDOMEN AND PELVIS WITH CONTRAST TECHNIQUE: Multidetector CT imaging of the abdomen and pelvis was performed using the standard protocol following bolus administration of intravenous contrast. CONTRAST:  149mL ISOVUE-300 IOPAMIDOL (ISOVUE-300) INJECTION 61% COMPARISON:  Renal ultrasound 05/09/2014 FINDINGS: Lower chest: Minimal bibasilar atelectasis. Within the right lower lobe nodule is 10 x 6 mm on image 16 of series 4. Previously nodule measured 8 x 13 mm no suspicious pulmonary nodules are identified. Heart size is normal. No imaged pericardial effusion or significant coronary artery calcifications. Hepatobiliary: Status postcholecystectomy. Mild intra and extrahepatic biliary duct dilatation following surgery. No focal liver lesion identified. Pancreas: Normal appearance of the pancreas. Spleen: Normal appearance of the spleen. Adrenals/Urinary Tract: Adrenal glands are normal in appearance. There is moderate right-sided hydronephrosis. Right renal parenchymal thinning is noted, indicating long-standing obstruction. Enhancement of the renal collecting system and right ureter which is dilated to the level of S2. At this level there is abrupt tapering without radiopaque calculus identified. The lower ureter is normal in caliber. No stones identified within the bladder. Left kidney and ureter are normal in appearance.  Stomach/Bowel: The stomach and small bowel loops are normal in appearance. Appendix is not well seen and may be obscured or absent. Numerous colonic diverticula are present. No acute diverticulitis identified. No abscess or free air. Vascular/Lymphatic: Moderate atherosclerosis of the abdominal aorta. No aneurysm. No retroperitoneal or mesenteric adenopathy. Reproductive: Status post hysterectomy. Pessary in the vaginal apex. No adnexal mass. No free pelvic fluid. Other: Abdominal wall is unremarkable. Musculoskeletal: Status post posterior fusion L3-L5. Remote wedge compression fracture of L2. IMPRESSION: 1. Moderate right hydronephrosis, likely chronic given the associated renal parenchymal thinning. The tapering at the level of S2 is not associated with calculus at this time and may be related to scarring or urothelial lesion. Malignancy is not excluded. Recommend further urological the evaluation. 2. Colonic diverticulosis. 3. Atherosclerosis of the abdominal aorta. 4. Stable scarring at the lung bases. 5. Postoperative changes in the lumbar spine. 6. Remote wedge compression fracture of L2. Electronically Signed   By: Nolon Nations M.D.   On: 05/07/2016 09:49    I independently reviewed the above imaging studies.  Impression/Recommendation: 75 year old with history of chronic right hydronephrosis due to known right ureteral stricture and nephrolithiasis requiring multiple prior stone procedures now with right pyelonephritis. We discussed options such as treating with antibiotics and fluids alone without intervention vs  proceeding with right ureteral stent placement. Given her pain, nausea, and known slow albeit not obstructed drainage of the right kidney we will proceed with right ureteral stent placement after discussion of risk and benefits with her.   - Keep NPO pending OR then can resume regular diet  - Will post for cystoscopy, right ureteral stent placement, right retrograde pyelogram this  evening - Ok to stay on anticoagulation - Agree with IV antibiotics pending improvement in clinical status and cultures  Lolita Rieger 05/07/2016, 5:26 PM   I have interviewed and examined the pt. I have reviewed her labs and imaging studies--readings as well as images.   I agree with the above assessment as well as plan, and have discussed with the pt and her daughter.

## 2016-05-07 NOTE — Op Note (Signed)
Operative Note:  Preoperative Diagnosis: Right hydronephrosis  Postoperative Diagnosis:  Same  Procedure(s) Performed:   1. Cystourethroscopy 2. Right retrograde pyelogram with interpretation 3. Right ureteral stent placement, 52F x 24 cm JJ ureteral stent without string  Teaching Surgeon:  Franchot Gallo, MD  Resident Surgeon:  Virginia Crews, MD  Assistant(s):  None  Anesthesia:  General via endotracheal tube  Fluids:  See anesthesia record  Estimated blood loss:  <5 mL  Specimens:  None  Cultures:  Right renal pelvis urine sent for culture  Drains:  RIGHT 764F x 24 cm JJ ureteral stent without string  Complications:  None  Indications: See prior  Findings:   1. Cystourethroscopy with normal bladder and urethra but debris and purulent efflux from the right ureteral orifice 2. Right retrograde pyelogram demonstrated a distal ureteral stricture starting from the pelvic brim and extending distally in addition to another narrow area in the proximal right ureter 3. Successful advancement of sensor wire into right renal pelvis and placement of right 764F x 24 cm JJ ureteral stent without string  Description:  The patient was correctly identified in the preop holding area where written informed consent as well potential risk and complication reviewed. She agreed. They were brought back to the operative suite where a preinduction timeout was performed. Once correct information was verified, general anesthesia was induced via endotracheal tube. They were then gently placed into dorsal lithotomy position with SCDs in place for VTE prophylaxis. They were prepped and draped in the usual sterile fashion and given appropriate preoperative antibiotics. A second timeout was then performed.   We inserted a 31F rigid cystoscope per urethra with copious lubrication and normal saline irrigation running. This demonstrated findings as described above.  We cannulated the right ureteral orifice with the  combination of a open ended catheter and a sensor wire and performed a gentle retrograde pyelogram which demonstrated moderate hydronephrosis and a narrow stricture distally from the pelvic brim and another narrow area in the proximal ureter. We then replaced the wire into the renal pelvis and advanced a 52F x 24 cm JJ ureteral stent without string under fluoroscopic and direct visual guidance. There was an excellent stent curl in the right renal pelvis and curl in the bladder following wire removal. The bladder was emptied and all instrumentation was removed. She was awoken and taken to the PACU in stable condition.  Post Op Plan:   1. Transfer patient back to [facility]- ICU when meets PACU criteria.  Attestation:  Dr. Diona Fanti was present and scrubbed for key portions of the and and scrubbed for the entirety of the procedure.    I participated in all portions of the above procedure. I have reviewed the above note and agree with findings as well as procedure.

## 2016-05-07 NOTE — Consult Note (Signed)
CONSULTATION NOTE  Reason for Consult: A-fib, pre-operative risk assessment  Requesting Physician: Dr. Erlinda Hong  Cardiologist: Dr. Joycelyn Rua [facility], [place]) / Dr. Ola Spurr (EP)  HPI: This is a 75 y.o. female with a past medical history significant for A fib, Hypertension, Hyperlipidemia, and no hx of CAD who was hospitalized in 2014 for rapid atrial fibrillation and NSTEMI. Patient was diagnosed with A fib in June 2012 when she presented for kidney infection, but she did not have sx of sob, lightheadness, or palpations. Ever since then, she has been on metoprolol and ASA. She was not on amiodarone PTA. She only has intermittent flutters that lasts <30sec with stress and anxiety, and occur less than 2x/month. She had a negative stress test in the past. At baseline she has dyspnea on exertion but does not have weakness, vision changes, palpitations, chest pain, presyncope, or nighttime awakenings. She went to [facility] 8/8 when she developed S/S of pyelonephritis 24 hr after ureteral stent placement. She was in septic shock/ARF and transferred to [facility]. This seems to be an ongoing issue for her. She again presented this admission to [facility] for flank pain, radiating to the right groin and was found to have a leukocytosis, abnormal UA and normal lactate level. CODE sepsis was initiated. Urology was notified. Cardiology is asked to see her as she recently was started on Flecainide by her cardiologist and we are asked to provide risk assessment and management recommendations prior to procedures. She was last seen by Dr. Ola Spurr on 04/23/2016, who noted she was on diltiazem, carvedilol and flecainide 50 mg q12 hrs as well as Eliquis 5 mg BID for anticoagulation (CHADSVASC of 3). She apparently had an echo in 12/2015 which showed "low-normal left ventricle ejection fraction left ventricle hypertrophy" according to Dr. Agustin Cree.  PMHx:  Past Medical History  Diagnosis Date  .  GERD (gastroesophageal reflux disease)   . Hyperlipidemia   . History of kidney stones   . History of septic shock     2011 and 06-04-2013  w/ acute renal failure  . PAF (paroxysmal atrial fibrillation) [facility]) DX JUNE 2012    CARDIOLOGIST--  DR KRASOWSKI ([facility] [place])  CHADS2  . Hypertension   . History of uterine cancer     STAGE I  --  S/P TAH W/ BSO  (NO OTHER TX)  . Wears glasses   . Ureteral stricture, right    Past Surgical History  Procedure Laterality Date  . Cysto/  bilateral retrograde pyelogram/ right ureteroscopy and stent placement  10-04-2005  . Cysto/ left ureteroscopic stone extraction  04-03-2006  . Cysto/  balloon dilation right ureteral stricture/ stent placement  06-02-2013  . Transthoracic echocardiogram  06-07-2013    LVSF 45-50% /  MILD DILATED LA  . Lumbar fusion  JULY 2013  . Cholecystectomy  2010  . Nephrolithotomy  X2 YRS AGO  . Total knee arthroplasty Bilateral 1992  &  1996  . Total abdominal hysterectomy w/ bilateral salpingoophorectomy  1989  . Cystoscopy with litholapaxy N/A 11/21/2013    Procedure: CYSTOSCOPY WITH LITHOLAPAXY;  Surgeon: Ailene Rud, MD;  Location: [facility];  Service: Urology;  Laterality: N/A;  . Ureteroscopy Right 11/21/2013    Procedure: URETEROSCOPY WITH STENT REMOVAL AND REPLACEMENT;  Surgeon: Ailene Rud, MD;  Location: [facility];  Service: Urology;  Laterality: Right;  . Cystoscopy w/ ureteral stent placement Right 02/06/2014    Procedure: CYSTOSCOPY WITH RIGHT RETROGRADE PYELOGRAM  RIGHT STENT REMOVAL and REPLACEMENT, Right Ureteroscopy;  Surgeon: Ailene Rud, MD;  Location: [facility];  Service: Urology;  Laterality: Right;  . Cystoscopy with retrograde pyelogram, ureteroscopy and stent placement Bilateral 03/15/2014    Procedure: CYSTOSCOPY WITH BILATERAL RETROGRADE PYELOGRAM, RIGHT DIAGNOSTIC URETEROSCOPY AND STENT EXCHANGE;  Surgeon:  Alexis Frock, MD;  Location: [facility];  Service: Urology;  Laterality: Bilateral;    FAMHx: Family History  Problem Relation Age of Onset  . Heart disease Mother   . Heart disease Father   . Heart failure Mother   . Heart failure Father   . Cancer Mother     SOCHx:  reports that she has never smoked. She has never used smokeless tobacco. She reports that she does not drink alcohol or use illicit drugs.  ALLERGIES: Allergies  Allergen Reactions  . Codeine Nausea Only and Other (See Comments)    hallucinations  . Hydrocodone Nausea Only and Other (See Comments)  . Macrodantin [Nitrofurantoin] Nausea And Vomiting    ROS: Review of systems not obtained due to patient factors.  HOME MEDICATIONS:   Medication List    ASK your doctor about these medications        amitriptyline 25 MG tablet  Commonly known as:  ELAVIL  Take 25 mg by mouth at bedtime.     apixaban 5 MG Tabs tablet  Commonly known as:  ELIQUIS  Take 5 mg by mouth 2 (two) times daily.     carvedilol 6.25 MG tablet  Commonly known as:  COREG  Take 1 tablet (6.25 mg total) by mouth 2 (two) times daily with a meal.     diltiazem 240 MG 24 hr capsule  Commonly known as:  CARDIZEM CD  Take 240 mg by mouth daily.     fenofibrate micronized 134 MG capsule  Commonly known as:  LOFIBRA  Take 134 mg by mouth daily before breakfast.     flecainide 50 MG tablet  Commonly known as:  TAMBOCOR  Take 50 mg by mouth 2 (two) times daily.     furosemide 40 MG tablet  Commonly known as:  LASIX  Take 40 mg by mouth daily.     omeprazole 20 MG capsule  Commonly known as:  PRILOSEC  Take 20 mg by mouth every morning.     potassium chloride 10 MEQ tablet  Commonly known as:  K-DUR,KLOR-CON  Take 10 mEq by mouth daily.        HOSPITAL MEDICATIONS: I have reviewed the patient's current medications.  VITALS: Blood pressure 130/87, pulse 138, temperature 98.5 F (36.9 C), temperature source Oral, resp. rate  21, height '5\' 7"'  (1.702 m), weight 194 lb (87.998 kg), SpO2 87 %.  PHYSICAL EXAM: General appearance: sleepy, but arousable and conversive Neck: no carotid bruit and no JVD Lungs: clear to auscultation bilaterally Heart: irregularly irregular rhythm and tachycardic Abdomen: soft, non-tender; bowel sounds normal; no masses,  no organomegaly and obese, TTP over right flank Extremities: extremities normal, atraumatic, no cyanosis or edema Pulses: 2+ and symmetric Skin: Skin color, texture, turgor normal. No rashes or lesions Neurologic: Grossly normal Psych" Pleasant  LABS: Results for orders placed or performed during the hospital encounter of 05/07/16 (from the past 48 hour(s))  CBC with Differential/Platelet     Status: Abnormal   Collection Time: 05/07/16  6:46 AM  Result Value Ref Range   WBC 18.1 (H) 4.0 - 10.5 K/uL   RBC 6.09 (H) 3.87 - 5.11 MIL/uL  Hemoglobin 14.9 12.0 - 15.0 g/dL   HCT 45.8 36.0 - 46.0 %   MCV 75.2 (L) 78.0 - 100.0 fL   MCH 24.5 (L) 26.0 - 34.0 pg   MCHC 32.5 30.0 - 36.0 g/dL   RDW 16.3 (H) 11.5 - 15.5 %   Platelets 603 (H) 150 - 400 K/uL   Neutrophils Relative % 79 %   Neutro Abs 14.2 (H) 1.7 - 7.7 K/uL   Lymphocytes Relative 9 %   Lymphs Abs 1.7 0.7 - 4.0 K/uL   Monocytes Relative 11 %   Monocytes Absolute 2.0 (H) 0.1 - 1.0 K/uL   Eosinophils Relative 1 %   Eosinophils Absolute 0.2 0.0 - 0.7 K/uL   Basophils Relative 0 %   Basophils Absolute 0.1 0.0 - 0.1 K/uL  Basic metabolic panel     Status: Abnormal   Collection Time: 05/07/16  6:46 AM  Result Value Ref Range   Sodium 137 135 - 145 mmol/L   Potassium 3.8 3.5 - 5.1 mmol/L   Chloride 104 101 - 111 mmol/L   CO2 22 22 - 32 mmol/L   Glucose, Bld 109 (H) 65 - 99 mg/dL   BUN 17 6 - 20 mg/dL   Creatinine, Ser 1.16 (H) 0.44 - 1.00 mg/dL   Calcium 9.5 8.9 - 10.3 mg/dL   GFR calc non Af Amer 45 (L) >60 mL/min   GFR calc Af Amer 52 (L) >60 mL/min    Comment: (NOTE) The eGFR has been calculated  using the CKD EPI equation. This calculation has not been validated in all clinical situations. eGFR's persistently <60 mL/min signify possible Chronic Kidney Disease.    Anion gap 11 5 - 15  Urinalysis, Routine w reflex microscopic (not at [facility])     Status: Abnormal   Collection Time: 05/07/16  8:10 AM  Result Value Ref Range   Color, Urine YELLOW YELLOW   APPearance TURBID (A) CLEAR   Specific Gravity, Urine 1.017 1.005 - 1.030   pH 6.0 5.0 - 8.0   Glucose, UA NEGATIVE NEGATIVE mg/dL   Hgb urine dipstick LARGE (A) NEGATIVE   Bilirubin Urine NEGATIVE NEGATIVE   Ketones, ur NEGATIVE NEGATIVE mg/dL   Protein, ur 100 (A) NEGATIVE mg/dL   Nitrite POSITIVE (A) NEGATIVE   Leukocytes, UA LARGE (A) NEGATIVE  Urine microscopic-add on     Status: Abnormal   Collection Time: 05/07/16  8:10 AM  Result Value Ref Range   Squamous Epithelial / LPF 0-5 (A) NONE SEEN   WBC, UA TOO NUMEROUS TO COUNT 0 - 5 WBC/hpf   RBC / HPF 6-30 0 - 5 RBC/hpf   Bacteria, UA MANY (A) NONE SEEN  I-Stat CG4 Lactic Acid, ED     Status: None   Collection Time: 05/07/16  9:45 AM  Result Value Ref Range   Lactic Acid, Venous 1.24 0.5 - 1.9 mmol/L  I-Stat CG4 Lactic Acid, ED  (not at  [facility])     Status: None   Collection Time: 05/07/16  1:16 PM  Result Value Ref Range   Lactic Acid, Venous 1.51 0.5 - 1.9 mmol/L  Heparin level (unfractionated)     Status: Abnormal   Collection Time: 05/07/16  1:57 PM  Result Value Ref Range   Heparin Unfractionated 1.96 (H) 0.30 - 0.70 IU/mL    Comment: RESULTS CONFIRMED BY MANUAL DILUTION        IF HEPARIN RESULTS ARE BELOW EXPECTED VALUES, AND PATIENT DOSAGE HAS BEEN CONFIRMED, SUGGEST FOLLOW UP  TESTING OF ANTITHROMBIN III LEVELS.   APTT     Status: None   Collection Time: 05/07/16  1:57 PM  Result Value Ref Range   aPTT 29 24 - 37 seconds    IMAGING: Ct Abdomen Pelvis W Contrast  05/07/2016  CLINICAL DATA:  Rt flank pain since early am. H/o stones, prev surg for  stones, elevated WBC, ovarian ca 1989 with hysterectomy. Prev GB surg Not a stone protocol per Dr Tran^140m ISOVUE-300 IOPAMIDOL (ISOVUE-300) INJECTION 61% EXAM: CT ABDOMEN AND PELVIS WITH CONTRAST TECHNIQUE: Multidetector CT imaging of the abdomen and pelvis was performed using the standard protocol following bolus administration of intravenous contrast. CONTRAST:  103mISOVUE-300 IOPAMIDOL (ISOVUE-300) INJECTION 61% COMPARISON:  Renal ultrasound 05/09/2014 FINDINGS: Lower chest: Minimal bibasilar atelectasis. Within the right lower lobe nodule is 10 x 6 mm on image 16 of series 4. Previously nodule measured 8 x 13 mm no suspicious pulmonary nodules are identified. Heart size is normal. No imaged pericardial effusion or significant coronary artery calcifications. Hepatobiliary: Status postcholecystectomy. Mild intra and extrahepatic biliary duct dilatation following surgery. No focal liver lesion identified. Pancreas: Normal appearance of the pancreas. Spleen: Normal appearance of the spleen. Adrenals/Urinary Tract: Adrenal glands are normal in appearance. There is moderate right-sided hydronephrosis. Right renal parenchymal thinning is noted, indicating long-standing obstruction. Enhancement of the renal collecting system and right ureter which is dilated to the level of S2. At this level there is abrupt tapering without radiopaque calculus identified. The lower ureter is normal in caliber. No stones identified within the bladder. Left kidney and ureter are normal in appearance. Stomach/Bowel: The stomach and small bowel loops are normal in appearance. Appendix is not well seen and may be obscured or absent. Numerous colonic diverticula are present. No acute diverticulitis identified. No abscess or free air. Vascular/Lymphatic: Moderate atherosclerosis of the abdominal aorta. No aneurysm. No retroperitoneal or mesenteric adenopathy. Reproductive: Status post hysterectomy. Pessary in the vaginal apex. No adnexal  mass. No free pelvic fluid. Other: Abdominal wall is unremarkable. Musculoskeletal: Status post posterior fusion L3-L5. Remote wedge compression fracture of L2. IMPRESSION: 1. Moderate right hydronephrosis, likely chronic given the associated renal parenchymal thinning. The tapering at the level of S2 is not associated with calculus at this time and may be related to scarring or urothelial lesion. Malignancy is not excluded. Recommend further urological the evaluation. 2. Colonic diverticulosis. 3. Atherosclerosis of the abdominal aorta. 4. Stable scarring at the lung bases. 5. Postoperative changes in the lumbar spine. 6. Remote wedge compression fracture of L2. Electronically Signed   By: ElNolon Nations.D.   On: 05/07/2016 09:49   EKG: Not yet ordered  HOSPITAL DIAGNOSES: Principal Problem:   Sepsis ([place] Problems:   Atrial fibrillation with rapid ventricular response (HCC)   Ureteral stricture, right   IMPRESSION: 1. A-fib with rapid ventricular response 2. Low to intermediate risk for procedures  RECOMMENDATION: 1. Mrs. Jaggi has PAF which is likely rate-accelerated due to pain and infection. I agree with IV cardizem for additional rate control. I would continue flecainide and check daily 12 lead EKG's to follow QTC. Would avoid QT prolonging agents, especially quinolone antibiotics with flecainide. Would advise holding Eliquis for 2 days if possible prior to urologic procedures for lowest adverse bleeding risk. May wish to transition to IV heparin. She had reportedly "low normal" LVEF by echo in 12/2015. She could not recall the last time she had a stress test and denies having had a cath in the past.  Cardiology will follow with you. Thanks for the consult.  Time Spent Directly with Patient: 30 minutes  Pixie Casino, MD, [facility] Attending Cardiologist [place] 05/07/2016, 2:59 PM

## 2016-05-07 NOTE — ED Notes (Signed)
Pt using restroom - will come back to draw labs

## 2016-05-07 NOTE — Anesthesia Procedure Notes (Signed)
Procedure Name: Intubation Date/Time: 05/07/2016 8:06 PM Performed by: Danley Danker L Patient Re-evaluated:Patient Re-evaluated prior to inductionOxygen Delivery Method: Circle system utilized Preoxygenation: Pre-oxygenation with 100% oxygen Intubation Type: IV induction Ventilation: Mask ventilation without difficulty Laryngoscope Size: Glidescope and 3 Grade View: Grade I Tube type: Oral Tube size: 7.0 mm Number of attempts: 1 Airway Equipment and Method: Video-laryngoscopy Placement Confirmation: ETT inserted through vocal cords under direct vision,  positive ETCO2 and breath sounds checked- equal and bilateral Secured at: 21 cm Tube secured with: Tape Dental Injury: Teeth and Oropharynx as per pre-operative assessment

## 2016-05-07 NOTE — H&P (Signed)
History and Physical  [name] [name] [id number] DOB: 01-01-1941 DOA: 05/07/2016  Referring physician: EDP PCP: [name], MD   Chief Complaint: right sided flank pain , n/v  HPI: [name] [name] is a 75 y.o. female  With h/o afib on apixaban, h/o bacteremia, septic shock from urinary source in 2011 and 2014 presented to ED due to right flank pain, n/v.   ED course, she has tachycardia, in afib/rvr, bp stable, ua + UTI, CT ab with right hydronephrosis, labs with leukocytosis, she is given ivf, rocephin, urine culture pending, hospitalist called to admit the patient. Urology consulted by EDP.   Patient had several episode of n/v in the ED, she is currently drowsy from pain meds and antiemetics, not able to provide detailed history. Husband at bedside.   Review of Systems:  Detail per HPI, Review of systems are otherwise negative  Past Medical History  Diagnosis Date  . GERD (gastroesophageal reflux disease)   . Hyperlipidemia   . History of kidney stones   . History of septic shock     2011 and 06-04-2013  w/ acute renal failure  . PAF (paroxysmal atrial fibrillation) [facility]) DX JUNE 2012    CARDIOLOGIST--  DR KRASOWSKI ([place] [facility] , )  CHADS2  . Hypertension   . History of uterine cancer     STAGE I  --  S/P TAH W/ BSO  (NO OTHER TX)  . Wears glasses   . Ureteral stricture, right    Past Surgical History  Procedure Laterality Date  . Cysto/  bilateral retrograde pyelogram/ right ureteroscopy and stent placement  10-04-2005  . Cysto/ left ureteroscopic stone extraction  04-03-2006  . Cysto/  balloon dilation right ureteral stricture/ stent placement  06-02-2013  . Transthoracic echocardiogram  06-07-2013    LVSF 45-50% /  MILD DILATED LA  . Lumbar fusion  JULY 2013  . Cholecystectomy  2010  . Nephrolithotomy  X2 YRS AGO  . Total knee arthroplasty Bilateral 1992  &  1996  . Total abdominal hysterectomy w/ bilateral salpingoophorectomy  1989  .  Cystoscopy with litholapaxy N/A 11/21/2013    Procedure: CYSTOSCOPY WITH LITHOLAPAXY;  Surgeon: Ailene Rud, MD;  Location: [facility];  Service: Urology;  Laterality: N/A;  . Ureteroscopy Right 11/21/2013    Procedure: URETEROSCOPY WITH STENT REMOVAL AND REPLACEMENT;  Surgeon: Ailene Rud, MD;  Location: [facility];  Service: Urology;  Laterality: Right;  . Cystoscopy w/ ureteral stent placement Right 02/06/2014    Procedure: CYSTOSCOPY WITH RIGHT RETROGRADE PYELOGRAM RIGHT STENT REMOVAL and REPLACEMENT, Right Ureteroscopy;  Surgeon: Ailene Rud, MD;  Location: [facility];  Service: Urology;  Laterality: Right;  . Cystoscopy with retrograde pyelogram, ureteroscopy and stent placement Bilateral 03/15/2014    Procedure: CYSTOSCOPY WITH BILATERAL RETROGRADE PYELOGRAM, RIGHT DIAGNOSTIC URETEROSCOPY AND STENT EXCHANGE;  Surgeon: Alexis Frock, MD;  Location: [facility];  Service: Urology;  Laterality: Bilateral;   Social History:  reports that she has never smoked. She has never used smokeless tobacco. She reports that she does not drink alcohol or use illicit drugs. Patient lives at home & is able to participate in activities of daily with help, she walks with a walker due to recent left knee injury  Allergies  Allergen Reactions  . Codeine Nausea Only and Other (See Comments)    hallucinations  . Hydrocodone Nausea Only and Other (See Comments)  . Macrodantin [Nitrofurantoin] Nausea And Vomiting    No family  history on file.    Prior to Admission medications   Medication Sig Start Date End Date Taking? Authorizing Provider  amitriptyline (ELAVIL) 25 MG tablet Take 25 mg by mouth at bedtime.   Yes Historical Provider, MD  apixaban (ELIQUIS) 5 MG TABS tablet Take 5 mg by mouth 2 (two) times daily.   Yes Historical Provider, MD  carvedilol (COREG) 6.25 MG tablet Take 1 tablet (6.25 mg total) by mouth 2 (two) times daily with a  meal. Patient taking differently: Take 3.125 mg by mouth 2 (two) times daily with a meal.  06/16/13  Yes [name], MD  diltiazem (CARDIZEM CD) 240 MG 24 hr capsule Take 240 mg by mouth daily.   Yes Historical Provider, MD  fenofibrate micronized (LOFIBRA) 134 MG capsule Take 134 mg by mouth daily before breakfast.   Yes Historical Provider, MD  flecainide (TAMBOCOR) 50 MG tablet Take 50 mg by mouth 2 (two) times daily.   Yes Historical Provider, MD  furosemide (LASIX) 40 MG tablet Take 40 mg by mouth daily.   Yes Historical Provider, MD  omeprazole (PRILOSEC) 20 MG capsule Take 20 mg by mouth every morning.    Yes Historical Provider, MD  potassium chloride (K-DUR,KLOR-CON) 10 MEQ tablet Take 10 mEq by mouth daily.   Yes Historical Provider, MD    Physical Exam: BP 130/78 mmHg  Pulse 128  Temp(Src) 98.5 F (36.9 C) (Oral)  Resp 18  Ht 5\' 7"  (1.702 m)  Wt 87.998 kg (194 lb)  BMI 30.38 kg/m2  SpO2 95%  General:  Drowsy, not able to provide reliable history Eyes: PERRL ENT: unremarkable Neck: supple, no JVD Cardiovascular: IRRR, afib/RVR Respiratory: CTABL Abdomen: soft/ND/NT, positive bowel sounds, right flank CVA tenderness Skin: no rash Musculoskeletal:  No edema Psychiatric: calm/cooperative Neurologic: no focal findings            Labs on Admission:  Basic Metabolic Panel:  Recent Labs Lab 05/07/16 0646  NA 137  K 3.8  CL 104  CO2 22  GLUCOSE 109*  BUN 17  CREATININE 1.16*  CALCIUM 9.5   Liver Function Tests: No results for input(s): AST, ALT, ALKPHOS, BILITOT, PROT, ALBUMIN in the last 168 hours. No results for input(s): LIPASE, AMYLASE in the last 168 hours. No results for input(s): AMMONIA in the last 168 hours. CBC:  Recent Labs Lab 05/07/16 0646  WBC 18.1*  NEUTROABS 14.2*  HGB 14.9  HCT 45.8  MCV 75.2*  PLT 603*   Cardiac Enzymes: No results for input(s): CKTOTAL, CKMB, CKMBINDEX, TROPONINI in the last 168 hours.  BNP (last 3 results) No  results for input(s): BNP in the last 8760 hours.  ProBNP (last 3 results) No results for input(s): PROBNP in the last 8760 hours.  CBG: No results for input(s): GLUCAP in the last 168 hours.  Radiological Exams on Admission: Ct Abdomen Pelvis W Contrast  05/07/2016  CLINICAL DATA:  Rt flank pain since early am. H/o stones, prev surg for stones, elevated WBC, ovarian ca 1989 with hysterectomy. Prev GB surg Not a stone protocol per Dr Tran^194mL ISOVUE-300 IOPAMIDOL (ISOVUE-300) INJECTION 61% EXAM: CT ABDOMEN AND PELVIS WITH CONTRAST TECHNIQUE: Multidetector CT imaging of the abdomen and pelvis was performed using the standard protocol following bolus administration of intravenous contrast. CONTRAST:  151mL ISOVUE-300 IOPAMIDOL (ISOVUE-300) INJECTION 61% COMPARISON:  Renal ultrasound 05/09/2014 FINDINGS: Lower chest: Minimal bibasilar atelectasis. Within the right lower lobe nodule is 10 x 6 mm on image 16 of series 4. Previously  nodule measured 8 x 13 mm no suspicious pulmonary nodules are identified. Heart size is normal. No imaged pericardial effusion or significant coronary artery calcifications. Hepatobiliary: Status postcholecystectomy. Mild intra and extrahepatic biliary duct dilatation following surgery. No focal liver lesion identified. Pancreas: Normal appearance of the pancreas. Spleen: Normal appearance of the spleen. Adrenals/Urinary Tract: Adrenal glands are normal in appearance. There is moderate right-sided hydronephrosis. Right renal parenchymal thinning is noted, indicating long-standing obstruction. Enhancement of the renal collecting system and right ureter which is dilated to the level of S2. At this level there is abrupt tapering without radiopaque calculus identified. The lower ureter is normal in caliber. No stones identified within the bladder. Left kidney and ureter are normal in appearance. Stomach/Bowel: The stomach and small bowel loops are normal in appearance. Appendix is not  well seen and may be obscured or absent. Numerous colonic diverticula are present. No acute diverticulitis identified. No abscess or free air. Vascular/Lymphatic: Moderate atherosclerosis of the abdominal aorta. No aneurysm. No retroperitoneal or mesenteric adenopathy. Reproductive: Status post hysterectomy. Pessary in the vaginal apex. No adnexal mass. No free pelvic fluid. Other: Abdominal wall is unremarkable. Musculoskeletal: Status post posterior fusion L3-L5. Remote wedge compression fracture of L2. IMPRESSION: 1. Moderate right hydronephrosis, likely chronic given the associated renal parenchymal thinning. The tapering at the level of S2 is not associated with calculus at this time and may be related to scarring or urothelial lesion. Malignancy is not excluded. Recommend further urological the evaluation. 2. Colonic diverticulosis. 3. Atherosclerosis of the abdominal aorta. 4. Stable scarring at the lung bases. 5. Postoperative changes in the lumbar spine. 6. Remote wedge compression fracture of L2. Electronically Signed   By: Nolon Nations M.D.   On: 05/07/2016 09:49    EKG: Independently reviewed. Afib/rvr Assessment/Plan Present on Admission:  **None**   Sepsis with uti and hydronephrosis: sepsis presented on admission with tachycardia, confusion, leukocytosis. Urine and blood culture obtained in the ED, continue rocephin which is started from the ED, start ivf, hold lasix, urology consulted  AFib/RVR:  Per husband , patient has chronic deconditioning due to uncontrolled afib, patient declined cardioversion recently offered by her cardiologist, husband reported good response from flecainide that was started a week ago,  patient is recently started on currently patient is drowsy with n/v, will hold off oral meds, start on  cardizem drip and heparin drip, cardiology consulted   Deconditioning with recent left knee injury, will need PT eval once medically stable   Diet : keep npo for now  due to drowsiness  DVT prophylaxis: on heparin drip  Consultants: urology/cardiology  Code Status: full   Family Communication:  Patient and her husband in room  Disposition Plan: admit to stepdown  Time spent: 12mins  Gavrielle Streck MD, PhD [facility] Pager [phone] If 7PM-7AM, please contact night-coverage at [email or web address], password [facility]

## 2016-05-07 NOTE — Progress Notes (Signed)
ANTICOAGULATION CONSULT NOTE - Initial Consult  Pharmacy Consult for IV heparin (transition from apixaban) Indication: atrial fibrillation  Allergies  Allergen Reactions  . Codeine Nausea Only and Other (See Comments)    hallucinations  . Hydrocodone Nausea Only and Other (See Comments)  . Macrodantin [Nitrofurantoin] Nausea And Vomiting    Patient Measurements: Height: 5\' 7"  (170.2 cm) Weight: 194 lb (87.998 kg) IBW/kg (Calculated) : 61.6 Heparin Dosing Weight: 80.3kg  Vital Signs: Temp: 98.5 F (36.9 C) (07/12 0543) Temp Source: Oral (07/12 0543) BP: 110/87 mmHg (07/12 1200) Pulse Rate: 61 (07/12 1200)  Labs:  Recent Labs  05/07/16 0646  HGB 14.9  HCT 45.8  PLT 603*  CREATININE 1.16*    Estimated Creatinine Clearance: 48.5 mL/min (by C-G formula based on Cr of 1.16).   Medical History: Past Medical History  Diagnosis Date  . GERD (gastroesophageal reflux disease)   . Hyperlipidemia   . History of kidney stones   . History of septic shock     2011 and 06-04-2013  w/ acute renal failure  . PAF (paroxysmal atrial fibrillation) [facility]) DX JUNE 2012    CARDIOLOGIST--  DR KRASOWSKI ([place] [facility] [place])  CHADS2  . Hypertension   . History of uterine cancer     STAGE I  --  S/P TAH W/ BSO  (NO OTHER TX)  . Wears glasses   . Ureteral stricture, right     Medications:  PTA apixaban 5mg  BID, LD 7/11 @ 2000  Assessment: 48 yoF with PMHx Afib on chronic anticoagulation, ovarian cancer,  and kidney stones, presents with flank pain.  Urology consulted.  Pharmacy consulted to transition pt to IV heparin.  Plts high, no issues with hgb.  CrCl > 30.   Goal of Therapy:  Heparin level 0.3-0.7 units/ml  APTT 66-102s Monitor platelets by anticoagulation protocol: Yes   Plan:  No heparin bolus due to recent apixaban Heparin infusion 1100 units/hr = 11 ml/hr STAT aPTT, HL F/u aPTT in 6 hours F/u daily HLs F/u APTT levels until they correlate with heparin  levels then can use only HLs   Ralene Bathe, PharmD, BCPS 05/07/2016, 1:50 PM  Pager: [phone]

## 2016-05-07 NOTE — Transfer of Care (Signed)
Immediate Anesthesia Transfer of Care Note  Patient: [name] [name]  Procedure(s) Performed: Procedure(s): CYSTOSCOPY WITH RETROGRADE PYELOGRAM, URETERAL STENT PLACEMENT (Right)  Patient Location: PACU  Anesthesia Type:General  Level of Consciousness: awake  Airway & Oxygen Therapy: Patient Spontanous Breathing and Patient connected to face mask oxygen  Post-op Assessment: Report given to RN and Post -op Vital signs reviewed and stable  Post vital signs: Reviewed and stable  Last Vitals:  Filed Vitals:   05/07/16 1830 05/07/16 1900  BP: 134/79 119/77  Pulse: 104 104  Temp:    Resp: 23 13    Last Pain:  Filed Vitals:   05/07/16 1907  PainSc: 0-No pain         Complications: No apparent anesthesia complications

## 2016-05-07 NOTE — ED Notes (Signed)
Pt states she woke up this morning with pain in her right flank area  Pt states she has hx of kidney stones  Pt is c/o nausea without vomiting

## 2016-05-07 NOTE — Progress Notes (Signed)
PHARMACY NOTE -  Rocephin  Pharmacy has been assisting with dosing of Rocephin for UTI. Dosage remains stable at 1g IV q24 hr and need for further dosage adjustment appears unlikely at present.    Will sign off at this time.  Please reconsult if a change in clinical status warrants re-evaluation of dosage.  Reuel Boom, PharmD, BCPS Pager: [phone] 05/07/2016, 9:40 AM

## 2016-05-07 NOTE — ED Provider Notes (Signed)
CSN: [id number]     Arrival date & time 05/07/16  0536 History   First MD Initiated Contact with Patient 05/07/16 0604     Chief Complaint  Patient presents with  . Flank Pain     (Consider location/radiation/quality/duration/timing/severity/associated sxs/prior Treatment) HPI   75 year old female with history of kidney stones, paroxysmal A. fib, urine cancer, and GERD presenting today with complaints of flank pain. Patient report acute onset of right flank pain approximately 4 hours ago. Pain is intense, severe, radiated to right groin, worsening with taking a deep breath or with movement and felt similar to prior kidney stones that she had many times in the past. She endorse nausea and dry heaving without vomiting. She denies fever, chills, cough, shortness of breath, chest pain, dysuria, hematuria, vaginal bleeding or vaginal discharge, focal numbness or weakness, or rash. The last time she had a kidney stone was 3 years ago. The last time that she saw her urologist was 2 months ago.  She has had prior stenting and lithotripsy. Also has history of ureteral stricture.  No history of AAA. No prior history of shingle.    Past Medical History  Diagnosis Date  . GERD (gastroesophageal reflux disease)   . Hyperlipidemia   . History of kidney stones   . History of septic shock     2011 and 06-04-2013  w/ acute renal failure  . PAF (paroxysmal atrial fibrillation) [facility]) DX JUNE 2012    CARDIOLOGIST--  DR KRASOWSKI ([place] [facility] , [place])  CHADS2  . Hypertension   . History of uterine cancer     STAGE I  --  S/P TAH W/ BSO  (NO OTHER TX)  . Wears glasses   . Ureteral stricture, right    Past Surgical History  Procedure Laterality Date  . Cysto/  bilateral retrograde pyelogram/ right ureteroscopy and stent placement  10-04-2005  . Cysto/ left ureteroscopic stone extraction  04-03-2006  . Cysto/  balloon dilation right ureteral stricture/ stent placement  06-02-2013  .  Transthoracic echocardiogram  06-07-2013    LVSF 45-50% /  MILD DILATED LA  . Lumbar fusion  JULY 2013  . Cholecystectomy  2010  . Nephrolithotomy  X2 YRS AGO  . Total knee arthroplasty Bilateral 1992  &  1996  . Total abdominal hysterectomy w/ bilateral salpingoophorectomy  1989  . Cystoscopy with litholapaxy N/A 11/21/2013    Procedure: CYSTOSCOPY WITH LITHOLAPAXY;  Surgeon: Ailene Rud, MD;  Location: [facility];  Service: Urology;  Laterality: N/A;  . Ureteroscopy Right 11/21/2013    Procedure: URETEROSCOPY WITH STENT REMOVAL AND REPLACEMENT;  Surgeon: Ailene Rud, MD;  Location: [facility];  Service: Urology;  Laterality: Right;  . Cystoscopy w/ ureteral stent placement Right 02/06/2014    Procedure: CYSTOSCOPY WITH RIGHT RETROGRADE PYELOGRAM RIGHT STENT REMOVAL and REPLACEMENT, Right Ureteroscopy;  Surgeon: Ailene Rud, MD;  Location: [facility];  Service: Urology;  Laterality: Right;  . Cystoscopy with retrograde pyelogram, ureteroscopy and stent placement Bilateral 03/15/2014    Procedure: CYSTOSCOPY WITH BILATERAL RETROGRADE PYELOGRAM, RIGHT DIAGNOSTIC URETEROSCOPY AND STENT EXCHANGE;  Surgeon: Alexis Frock, MD;  Location: [facility];  Service: Urology;  Laterality: Bilateral;   No family history on file. Social History  Substance Use Topics  . Smoking status: Never Smoker   . Smokeless tobacco: Never Used  . Alcohol Use: No   OB History    No data available     Review of Systems  All other systems reviewed and are negative.     Allergies  Codeine; Hydrocodone; and Macrodantin  Home Medications   Prior to Admission medications   Medication Sig Start Date End Date Taking? Authorizing Provider  amiodarone (PACERONE) 100 MG tablet Take 100 mg by mouth every evening.    Historical Provider, MD  amitriptyline (ELAVIL) 25 MG tablet Take 25 mg by mouth at bedtime.    Historical Provider, MD  aspirin EC  325 MG tablet Take 325 mg by mouth every morning.     Historical Provider, MD  carvedilol (COREG) 6.25 MG tablet Take 1 tablet (6.25 mg total) by mouth 2 (two) times daily with a meal. 06/16/13   [name], MD  conjugated estrogens (PREMARIN) vaginal cream Place 1 Applicatorful vaginally 2 (two) times a week. Monday and Friday    Historical Provider, MD  fenofibrate micronized (LOFIBRA) 134 MG capsule Take 134 mg by mouth daily before breakfast.    Historical Provider, MD  Ibuprofen (ADVIL) 200 MG CAPS Take 400 mg by mouth every 6 (six) hours as needed (pain).     Historical Provider, MD  omeprazole (PRILOSEC) 20 MG capsule Take 20 mg by mouth every morning.     Historical Provider, MD  ondansetron (ZOFRAN) 4 MG tablet Take 4 mg by mouth every 12 (twelve) hours as needed for nausea.    Historical Provider, MD  oxyCODONE-acetaminophen (PERCOCET/ROXICET) 5-325 MG per tablet Take 1-2 tablets by mouth every 6 (six) hours as needed for severe pain. Post-operatively 03/15/14   Alexis Frock, MD  phenazopyridine (PYRIDIUM) 200 MG tablet Take 1 tablet (200 mg total) by mouth 3 (three) times daily as needed for pain. 02/06/14   Carolan Clines, MD  senna-docusate (SENOKOT-S) 8.6-50 MG per tablet Take 1 tablet by mouth 2 (two) times daily. While taking pain meds to prevent constipation 03/15/14   Alexis Frock, MD  sulfamethoxazole-trimethoprim (BACTRIM DS) 800-160 MG per tablet Take 1 tablet by mouth 2 (two) times daily. X 6 days to prevent infection 03/15/14   Alexis Frock, MD   BP 121/85 mmHg  Pulse 104  Temp(Src) 98.5 F (36.9 C) (Oral)  Resp 18  Ht 5\' 7"  (1.702 m)  Wt 87.998 kg  BMI 30.38 kg/m2  SpO2 94% Physical Exam  Constitutional: She appears well-developed and well-nourished. No distress.  Obese Caucasian female resting in bed in some mild discomfort but nontoxic in appearance  HENT:  Head: Atraumatic.  Eyes: Conjunctivae are normal.  Neck: Neck supple.  Cardiovascular: Intact distal  pulses.   Mild tachycardia without murmurs rubs gallops  Pulmonary/Chest: Effort normal and breath sounds normal.  Abdominal: Soft. There is tenderness (Tenderness to right side abdomen on palpation without focal point tenderness.).  Genitourinary:  Right CVA tenderness.  Musculoskeletal: She exhibits no edema or tenderness.  Neurological: She is alert.  Skin: No rash noted.  Psychiatric: She has a normal mood and affect.  Nursing note and vitals reviewed.   ED Course  Procedures (including critical care time) Labs Review Labs Reviewed  CBC WITH DIFFERENTIAL/PLATELET - Abnormal; Notable for the following:    WBC 18.1 (*)    RBC 6.09 (*)    MCV 75.2 (*)    MCH 24.5 (*)    RDW 16.3 (*)    Platelets 603 (*)    Neutro Abs 14.2 (*)    Monocytes Absolute 2.0 (*)    All other components within normal limits  BASIC METABOLIC PANEL - Abnormal; Notable for the following:  Glucose, Bld 109 (*)    Creatinine, Ser 1.16 (*)    GFR calc non Af Amer 45 (*)    GFR calc Af Amer 52 (*)    All other components within normal limits  URINALYSIS, ROUTINE W REFLEX MICROSCOPIC (NOT AT [facility]) - Abnormal; Notable for the following:    APPearance TURBID (*)    Hgb urine dipstick LARGE (*)    Protein, ur 100 (*)    Nitrite POSITIVE (*)    Leukocytes, UA LARGE (*)    All other components within normal limits  URINE MICROSCOPIC-ADD ON - Abnormal; Notable for the following:    Squamous Epithelial / LPF 0-5 (*)    Bacteria, UA MANY (*)    All other components within normal limits  URINE CULTURE  CULTURE, BLOOD (ROUTINE X 2)  CULTURE, BLOOD (ROUTINE X 2)  I-STAT CG4 LACTIC ACID, ED  I-STAT CG4 LACTIC ACID, ED  I-STAT CG4 LACTIC ACID, ED    Imaging Review Ct Abdomen Pelvis W Contrast  05/07/2016  CLINICAL DATA:  Rt flank pain since early am. H/o stones, prev surg for stones, elevated WBC, ovarian ca 1989 with hysterectomy. Prev GB surg Not a stone protocol per Dr Vernadette Stutsman^128mL ISOVUE-300 IOPAMIDOL  (ISOVUE-300) INJECTION 61% EXAM: CT ABDOMEN AND PELVIS WITH CONTRAST TECHNIQUE: Multidetector CT imaging of the abdomen and pelvis was performed using the standard protocol following bolus administration of intravenous contrast. CONTRAST:  124mL ISOVUE-300 IOPAMIDOL (ISOVUE-300) INJECTION 61% COMPARISON:  Renal ultrasound 05/09/2014 FINDINGS: Lower chest: Minimal bibasilar atelectasis. Within the right lower lobe nodule is 10 x 6 mm on image 16 of series 4. Previously nodule measured 8 x 13 mm no suspicious pulmonary nodules are identified. Heart size is normal. No imaged pericardial effusion or significant coronary artery calcifications. Hepatobiliary: Status postcholecystectomy. Mild intra and extrahepatic biliary duct dilatation following surgery. No focal liver lesion identified. Pancreas: Normal appearance of the pancreas. Spleen: Normal appearance of the spleen. Adrenals/Urinary Tract: Adrenal glands are normal in appearance. There is moderate right-sided hydronephrosis. Right renal parenchymal thinning is noted, indicating long-standing obstruction. Enhancement of the renal collecting system and right ureter which is dilated to the level of S2. At this level there is abrupt tapering without radiopaque calculus identified. The lower ureter is normal in caliber. No stones identified within the bladder. Left kidney and ureter are normal in appearance. Stomach/Bowel: The stomach and small bowel loops are normal in appearance. Appendix is not well seen and may be obscured or absent. Numerous colonic diverticula are present. No acute diverticulitis identified. No abscess or free air. Vascular/Lymphatic: Moderate atherosclerosis of the abdominal aorta. No aneurysm. No retroperitoneal or mesenteric adenopathy. Reproductive: Status post hysterectomy. Pessary in the vaginal apex. No adnexal mass. No free pelvic fluid. Other: Abdominal wall is unremarkable. Musculoskeletal: Status post posterior fusion L3-L5. Remote  wedge compression fracture of L2. IMPRESSION: 1. Moderate right hydronephrosis, likely chronic given the associated renal parenchymal thinning. The tapering at the level of S2 is not associated with calculus at this time and may be related to scarring or urothelial lesion. Malignancy is not excluded. Recommend further urological the evaluation. 2. Colonic diverticulosis. 3. Atherosclerosis of the abdominal aorta. 4. Stable scarring at the lung bases. 5. Postoperative changes in the lumbar spine. 6. Remote wedge compression fracture of L2. Electronically Signed   By: Nolon Nations M.D.   On: 05/07/2016 09:49   I have personally reviewed and evaluated these images and lab results as part of my medical decision-making.  EKG Interpretation None      MDM   Final diagnoses:  Ureteral stricture, right  Pyelonephritis    BP 130/78 mmHg  Pulse 128  Temp(Src) 98.5 F (36.9 C) (Oral)  Resp 18  Ht 5\' 7"  (1.702 m)  Wt 87.998 kg  BMI 30.38 kg/m2  SpO2 95%   6:54 AM Elderly female with history of kidney stone here with right flank pain. She felt pain is very similar to prior kidney stone. She is NVI, low suspicion for dissection.  No rash, doubt shingle.  No cough or SOB, doubt PNA.  Does have R side abd pain, likely referred pain.  Plan to check UA and will consider CT scan for further care.    UA with large Hgb and Large Leukocytes, and Nitrite positive concerning for an infection.  Elevated WBC of 18.1.  Cr 1.16.  Normal lactic acid, afebrile, VSS.    Code sepsis called, abx given.  Care discussed with Dr. Kathrynn Humble  11:27 AM Abd/pelvis CT showing moderate R hydronephrosis, suggestive of scaring or urothelia lesion.  Malignancy is not excluded.  No obvious kidney stone.    I appreciate consultation from [facility] Dr. Diona Fanti who request pt to be admitted to medicine and he will be available for consultation.    11:58 AM I have consulted [facility] Dr. Erlinda Hong who agrees to  see pt in the ER and will admit to telemetry floor under her care.  Pt is aware of plan.      Domenic Moras, PA-C 05/07/16 1159  April Palumbo, MD 05/07/16 2340

## 2016-05-07 NOTE — ED Notes (Signed)
Bed: WA19 Expected date:  Expected time:  Means of arrival:  Comments: 

## 2016-05-07 NOTE — Anesthesia Preprocedure Evaluation (Addendum)
Anesthesia Evaluation  Patient identified by MRN, date of birth, ID band Patient awake    Reviewed: Allergy & Precautions, H&P , NPO status , Patient's Chart, lab work & pertinent test results, reviewed documented beta blocker date and time   Airway Mallampati: II  TM Distance: >3 FB Neck ROM: full    Dental no notable dental hx. (+) Dental Advisory Given, Teeth Intact   Pulmonary neg pulmonary ROS,    Pulmonary exam normal breath sounds clear to auscultation       Cardiovascular hypertension, Pt. on home beta blockers Normal cardiovascular exam+ dysrhythmias Atrial Fibrillation  Rhythm:regular Rate:Normal  ECG - AF   Neuro/Psych negative neurological ROS  negative psych ROS   GI/Hepatic negative GI ROS, Neg liver ROS,   Endo/Other  negative endocrine ROS  Renal/GU Renal disease  negative genitourinary   Musculoskeletal   Abdominal   Peds  Hematology negative hematology ROS (+)   Anesthesia Other Findings Early sepsis  Reproductive/Obstetrics negative OB ROS                            Anesthesia Physical Anesthesia Plan  ASA: III and emergent  Anesthesia Plan: General   Post-op Pain Management:    Induction: Intravenous  Airway Management Planned: Oral ETT  Additional Equipment:   Intra-op Plan:   Post-operative Plan: Extubation in OR  Informed Consent: I have reviewed the patients History and Physical, chart, labs and discussed the procedure including the risks, benefits and alternatives for the proposed anesthesia with the patient or authorized representative who has indicated his/her understanding and acceptance.   Dental Advisory Given  Plan Discussed with: CRNA  Anesthesia Plan Comments:        Anesthesia Quick Evaluation

## 2016-05-07 NOTE — Anesthesia Postprocedure Evaluation (Signed)
Anesthesia Post Note  Patient: [name] [name]  Procedure(s) Performed: Procedure(s) (LRB): CYSTOSCOPY WITH RETROGRADE PYELOGRAM, URETERAL STENT PLACEMENT (Right)  Patient location during evaluation: PACU Anesthesia Type: General Level of consciousness: awake and alert Pain management: pain level controlled Vital Signs Assessment: post-procedure vital signs reviewed and stable Respiratory status: spontaneous breathing, nonlabored ventilation, respiratory function stable and patient connected to nasal cannula oxygen Cardiovascular status: blood pressure returned to baseline and stable Postop Assessment: no signs of nausea or vomiting Anesthetic complications: no    Last Vitals:  Filed Vitals:   05/07/16 2152 05/07/16 2200  BP: 95/74 108/57  Pulse: 115 98  Temp: 37.1 C   Resp: 15 17    Last Pain:  Filed Vitals:   05/07/16 2212  PainSc: 0-No pain                 Jakalyn Kratky L

## 2016-05-08 ENCOUNTER — Encounter (HOSPITAL_COMMUNITY): Payer: Self-pay | Admitting: Urology

## 2016-05-08 DIAGNOSIS — N12 Tubulo-interstitial nephritis, not specified as acute or chronic: Secondary | ICD-10-CM

## 2016-05-08 DIAGNOSIS — N131 Hydronephrosis with ureteral stricture, not elsewhere classified: Secondary | ICD-10-CM

## 2016-05-08 LAB — COMPREHENSIVE METABOLIC PANEL
ALT: 17 U/L (ref 14–54)
AST: 28 U/L (ref 15–41)
Albumin: 3.1 g/dL — ABNORMAL LOW (ref 3.5–5.0)
Alkaline Phosphatase: 49 U/L (ref 38–126)
Anion gap: 8 (ref 5–15)
BUN: 17 mg/dL (ref 6–20)
CO2: 21 mmol/L — ABNORMAL LOW (ref 22–32)
Calcium: 8.4 mg/dL — ABNORMAL LOW (ref 8.9–10.3)
Chloride: 108 mmol/L (ref 101–111)
Creatinine, Ser: 0.88 mg/dL (ref 0.44–1.00)
GFR calc Af Amer: >60 mL/min (ref >60)
GFR calc non Af Amer: >60 mL/min (ref >60)
Glucose, Bld: 95 mg/dL (ref 65–99)
Potassium: 4.4 mmol/L (ref 3.5–5.1)
Sodium: 137 mmol/L (ref 135–145)
Total Bilirubin: 0.5 mg/dL (ref 0.3–1.2)
Total Protein: 6 g/dL — ABNORMAL LOW (ref 6.5–8.1)

## 2016-05-08 LAB — CBC
HCT: 40.4 % (ref 36.0–46.0)
Hemoglobin: 12.5 g/dL (ref 12.0–15.0)
MCH: 23.9 pg — ABNORMAL LOW (ref 26.0–34.0)
MCHC: 30.9 g/dL (ref 30.0–36.0)
MCV: 77.2 fL — ABNORMAL LOW (ref 78.0–100.0)
Platelets: 469 10*3/uL — ABNORMAL HIGH (ref 150–400)
RBC: 5.23 MIL/uL — ABNORMAL HIGH (ref 3.87–5.11)
RDW: 16.7 % — ABNORMAL HIGH (ref 11.5–15.5)
WBC: 17.3 10*3/uL — ABNORMAL HIGH (ref 4.0–10.5)

## 2016-05-08 LAB — APTT: aPTT: 62 seconds — ABNORMAL HIGH (ref 24–37)

## 2016-05-08 MED ORDER — APIXABAN 5 MG PO TABS
5.0000 mg | ORAL_TABLET | Freq: Two times a day (BID) | ORAL | Status: DC
  Administered 2016-05-08 – 2016-05-09 (×3): 5 mg via ORAL
  Filled 2016-05-08 (×3): qty 1

## 2016-05-08 MED ORDER — CARVEDILOL 3.125 MG PO TABS
3.1250 mg | ORAL_TABLET | Freq: Two times a day (BID) | ORAL | Status: DC
  Administered 2016-05-08 – 2016-05-09 (×3): 3.125 mg via ORAL
  Filled 2016-05-08 (×3): qty 1

## 2016-05-08 MED ORDER — DILTIAZEM HCL ER COATED BEADS 120 MG PO CP24
240.0000 mg | ORAL_CAPSULE | Freq: Every day | ORAL | Status: DC
  Administered 2016-05-08 – 2016-05-09 (×2): 240 mg via ORAL
  Filled 2016-05-08 (×2): qty 2

## 2016-05-08 MED ORDER — CARVEDILOL 3.125 MG PO TABS: 3.1250 mg | ORAL_TABLET | Freq: Two times a day (BID) | ORAL | Status: DC

## 2016-05-08 MED ORDER — PANTOPRAZOLE SODIUM 40 MG PO TBEC
40.0000 mg | DELAYED_RELEASE_TABLET | Freq: Every day | ORAL | Status: DC
  Administered 2016-05-08 – 2016-05-09 (×2): 40 mg via ORAL
  Filled 2016-05-08 (×2): qty 1

## 2016-05-08 MED ORDER — AMITRIPTYLINE HCL 25 MG PO TABS: 25.0000 mg | ORAL_TABLET | Freq: Every day | ORAL | Status: DC

## 2016-05-08 MED ORDER — ACETAMINOPHEN 325 MG PO TABS: 650.0000 mg | ORAL_TABLET | ORAL | Status: DC | PRN

## 2016-05-08 MED ORDER — HEPARIN (PORCINE) IN NACL 100-0.45 UNIT/ML-% IJ SOLN
1150.0000 [IU]/h | INTRAMUSCULAR | Status: AC
  Filled 2016-05-08: qty 250

## 2016-05-08 MED ORDER — CARVEDILOL 3.125 MG PO TABS
3.1250 mg | ORAL_TABLET | Freq: Two times a day (BID) | ORAL | Status: DC
Start: 2016-05-08 — End: 2016-05-08

## 2016-05-08 NOTE — Progress Notes (Signed)
1 Day Post-Op   Subjective: The patient is doing well and overall feels much better.  No nausea or vomiting. Pain is significantly improved this AM. Continues on heparin and dilt drip this AM with better rate control. Voiding without issue. WBC 17K from 18K yesterday, creatinine now down to 0.9 from 1.1-1.2.  Objective: Vital signs in last 24 hours: Temp:  [97.6 F (36.4 C)-98.9 F (37.2 C)] 97.6 F (36.4 C) (07/13 0400) Pulse Rate:  [61-138] 100 (07/13 0605) Resp:  [13-33] 16 (07/13 0605) BP: (94-140)/(42-106) 96/42 mmHg (07/13 0605) SpO2:  [87 %-100 %] 93 % (07/13 0605) Weight:  [92.2 kg (203 lb 4.2 oz)] 92.2 kg (203 lb 4.2 oz) (07/13 0411)  Intake/Output from previous day: 07/12 0701 - 07/13 0700 In: 1703 [I.V.:1603; IV Piggyback:100] Out: 600 [Urine:600] Intake/Output this shift: Total I/O In: 1484.5 [I.V.:1384.5; IV Piggyback:100] Out: 400 [Urine:400]  Physical Exam:  General: Alert and oriented. CV: normal peripheral perfusion Lungs: Clear bilaterally. GI: Soft, Nondistended, nontender GU: No CVA tenderness bilaterally Extremities: Nontender, no erythema, no edema.  Lab Results:  Recent Labs  05/07/16 0646 05/08/16 0517  HGB 14.9 12.5  HCT 45.8 40.4      Assessment/Plan: POD# 1 s/p right ureteral stent placement doing much better with no nausea, further pain. She appears to be tolerating the stent well.  1) Follow up urine culture and tailor antibiotics accordingly 2) Will follow up with Dr. Tresa Moore to discuss stent exchange vs removal or other possible interventions as outpatient 3) No contraindication to continuing anticoagulation from urologic standpoint   LOS: 1 day   Lolita Rieger 05/08/2016, 6:51 AM   I agree with the above assessment/plan and have spoken with both the pt and Dr Tresa Moore.

## 2016-05-08 NOTE — Progress Notes (Signed)
Patient Profile: 75 y/o female, followed by Dr. Joycelyn Rua [facility], [place]) / Dr. Ola Spurr (EP), with PAF, recently started on Flecainide and chronic anticoagulation with Eliquis, admitted for sepsis with UTI and hydronephrosis. Day 1 s/p right ureteral stent placement. Hospital course complicated by afib w/ RVR.    Subjective: Feeling better. Currently asymptomatic. She only feels her arrhthymias if HR>130.   Objective: Vital signs in last 24 hours: Temp:  [97.5 F (36.4 C)-98.9 F (37.2 C)] 97.5 F (36.4 C) (07/13 0800) Pulse Rate:  [61-138] 90 (07/13 0800) Resp:  [13-33] 14 (07/13 0800) BP: (87-140)/(42-106) 114/72 mmHg (07/13 0800) SpO2:  [87 %-100 %] 94 % (07/13 0800) Weight:  [203 lb 4.2 oz (92.2 kg)] 203 lb 4.2 oz (92.2 kg) (07/13 0411)    Intake/Output from previous day: 07/12 0701 - 07/13 0700 In: 1794.5 [I.V.:1694.5; IV Piggyback:100] Out: 600 [Urine:600] Intake/Output this shift:    Medications Current Facility-Administered Medications  Medication Dose Route Frequency Provider Last Rate Last Dose  . 0.9 %  sodium chloride infusion   Intravenous Continuous Florencia Reasons, MD 75 mL/hr at 05/08/16 0518    . acetaminophen (OFIRMEV) IV 1,000 mg  1,000 mg Intravenous Q6H Franchot Gallo, MD   1,000 mg at 05/08/16 0208  . amitriptyline (ELAVIL) tablet 25 mg  25 mg Oral QHS Gardiner Barefoot, NP   25 mg at 05/07/16 2229  . cefTRIAXone (ROCEPHIN) 1 g in dextrose 5 % 50 mL IVPB  1 g Intravenous Q24H Drew A Wofford, RPH      . diltiazem (CARDIZEM) 100 mg in dextrose 5 % 100 mL (1 mg/mL) infusion  5-15 mg/hr Intravenous Titrated Florencia Reasons, MD 5 mL/hr at 05/08/16 0213 5 mg/hr at 05/08/16 0213  . fentaNYL (SUBLIMAZE) injection 12.5 mcg  12.5 mcg Intravenous Q4H PRN Florencia Reasons, MD      . flecainide [facility]) tablet 50 mg  50 mg Oral BID Pixie Casino, MD   50 mg at 05/07/16 2229  . heparin ADULT infusion 100 units/mL (25000 units/230mL sodium chloride 0.45%)  1,150  Units/hr Intravenous Continuous Dorrene German, RPH 11.5 mL/hr at 05/08/16 0253 1,150 Units/hr at 05/08/16 0253  . lip balm (CARMEX) ointment           . promethazine (PHENERGAN) injection 12.5 mg  12.5 mg Intravenous Q8H PRN Florencia Reasons, MD   12.5 mg at 05/07/16 2233  . sodium chloride flush (NS) 0.9 % injection 3 mL  3 mL Intravenous Q12H Florencia Reasons, MD   3 mL at 05/07/16 2234    PE: General appearance: alert, cooperative and no distress Neck: no carotid bruit and no JVD Lungs: clear to auscultation bilaterally Heart: irregularly irregular rhythm and slightly tachy rate Extremities: no LEE, bilateral SCS present Pulses: 2+ and symmetric Skin: warm and dry Neurologic: Grossly normal  Lab Results:   Recent Labs  05/07/16 0646 05/08/16 0517  WBC 18.1* 17.3*  HGB 14.9 12.5  HCT 45.8 40.4  PLT 603* 469*   BMET  Recent Labs  05/07/16 0646 05/08/16 0517  NA 137 137  K 3.8 4.4  CL 104 108  CO2 22 21*  GLUCOSE 109* 95  BUN 17 17  CREATININE 1.16* 0.88  CALCIUM 9.5 8.4*     Assessment/Plan  Principal Problem:   Sepsis (HCC) Active Problems:   Atrial fibrillation with rapid ventricular response (HCC)   Ureteral stricture, right   Hydronephrosis   1. Sepsis with UTI and hydronephrosis: Day 1 s/p right  ureteral stent placement, per urology. On antibiotics. Avoid quinolone antibiotics given use of Flecainide for afib as combination may potentially increase QTC and induce ventriclar arrhthymias.    2. Atrial Fibrillation w/ RVR: HR improved since yesterday but still slightly elevated in the upper 90s-low 100s resting. Patient has been followed by Joycelyn Rua [facility], [place]) / Dr. Ola Spurr (EP) for this. Recently started on Flecainide and has been on chronic anticoagulation with Eliquis (currently on hold given recent urologic procedure, now on IV heparin). Rate acceleration likely secondary to pain and infection. Continue Flecainide for rhythm control, IV  Cardizem for rate control and IV heparin for anticoagulation. Avoid QT prolonging medication in combination with Flecainide. Monitor K (stable today at 4.4) EKG 7/12 showed stable QT/QTc at  278/394 ms. Will repeat 12 lead EKG today. Obtain daily EKGs to monitor QT/QTc.    LOS: 1 day    Vondra Aldredge M. Rosita Fire, PA-C 05/08/2016 9:26 AM

## 2016-05-08 NOTE — Progress Notes (Signed)
PROGRESS NOTE    [name] [name]  [id number] DOB: 12-08-1940 DOA: 05/07/2016  PCP: [name], MD   Brief Narrative:  [name] [name] is a 75 y.o. female  With h/o afib on apixaban, h/o bacteremia, septic shock from urinary source in 2011 and 2014 presented to ED due to right flank pain, n/v.   ED course, she has tachycardia, in afib/rvr, bp stable, ua + UTI, CT ab with right hydronephrosis, labs with leukocytosis, she is given ivf, rocephin, urine culture pending, hospitalist called to admit the patient. Urology consulted by EDP.   Patient had several episode of n/v in the ED, she is currently drowsy from pain meds and antiemetics, not able to provide detailed history. Husband at bedside.  Subjective: No back pain or vomiting so far today.   Assessment & Plan:   Principal Problem:   Sepsis / UTI - cont Rocephin- culture growing e coli  Active Problems:    Ureteral stricture, right/   Hydronephrosis - s/p stent yesterday - will f/u with Dr Tresa Moore as outpt    Atrial fibrillation with rapid ventricular response - transition back to oral Cardizem and Oral Coreg- stop CArdizem infusion - cont Flecanide - cardiology following  DVT prophylaxis: eliquis Code Status: full code Family Communication:  Disposition Plan: home  Consultants:   urology Procedures:   Stent right ureter  Antimicrobials:  Anti-infectives    Start     Dose/Rate Route Frequency Ordered Stop   05/08/16 1000  cefTRIAXone (ROCEPHIN) 1 g in dextrose 5 % 50 mL IVPB     1 g 100 mL/hr over 30 Minutes Intravenous Every 24 hours 05/07/16 0939     05/07/16 0930  cefTRIAXone (ROCEPHIN) 2 g in dextrose 5 % 50 mL IVPB     2 g 100 mL/hr over 30 Minutes Intravenous  Once 05/07/16 0915 05/07/16 1115       Objective: Filed Vitals:   05/08/16 1100 05/08/16 1116 05/08/16 1200 05/08/16 1600  BP: 121/100 118/57 107/58   Pulse: 101  93   Temp:   97.5 F (36.4 C) 98.2 F (36.8 C)  TempSrc:   Oral  Oral  Resp: 14  18   Height:      Weight:      SpO2: 94%  89%     Intake/Output Summary (Last 24 hours) at 05/08/16 1752 Last data filed at 05/08/16 0900  Gross per 24 hour  Intake   1912 ml  Output    400 ml  Net   1512 ml   Filed Weights   05/07/16 0543 05/08/16 0411  Weight: 87.998 kg (194 lb) 92.2 kg (203 lb 4.2 oz)    Examination: General exam: Appears comfortable  HEENT: PERRLA, oral mucosa moist, no sclera icterus or thrush Respiratory system: Clear to auscultation. Respiratory effort normal. Cardiovascular system: S1 & S2 heard, RRR.  No murmurs  Gastrointestinal system: Abdomen soft, non-tender, nondistended. Normal bowel sound. No organomegaly Central nervous system: Alert and oriented. No focal neurological deficits. Extremities: No cyanosis, clubbing or edema Skin: No rashes or ulcers Psychiatry:  Mood & affect appropriate.     Data Reviewed: I have personally reviewed following labs and imaging studies  CBC:  Recent Labs Lab 05/07/16 0646 05/08/16 0517  WBC 18.1* 17.3*  NEUTROABS 14.2*  --   HGB 14.9 12.5  HCT 45.8 40.4  MCV 75.2* 77.2*  PLT 603* [id number]*   Basic Metabolic Panel:  Recent Labs Lab 05/07/16 0646 05/08/16 0517  NA 137  137  K 3.8 4.4  CL 104 108  CO2 22 21*  GLUCOSE 109* 95  BUN 17 17  CREATININE 1.16* 0.88  CALCIUM 9.5 8.4*   GFR: Estimated Creatinine Clearance: 65.3 mL/min (by C-G formula based on Cr of 0.88). Liver Function Tests:  Recent Labs Lab 05/08/16 0517  AST 28  ALT 17  ALKPHOS 49  BILITOT 0.5  PROT 6.0*  ALBUMIN 3.1*   No results for input(s): LIPASE, AMYLASE in the last 168 hours. No results for input(s): AMMONIA in the last 168 hours. Coagulation Profile: No results for input(s): INR, PROTIME in the last 168 hours. Cardiac Enzymes: No results for input(s): CKTOTAL, CKMB, CKMBINDEX, TROPONINI in the last 168 hours. BNP (last 3 results) No results for input(s): PROBNP in the last 8760  hours. HbA1C: No results for input(s): HGBA1C in the last 72 hours. CBG: No results for input(s): GLUCAP in the last 168 hours. Lipid Profile: No results for input(s): CHOL, HDL, LDLCALC, TRIG, CHOLHDL, LDLDIRECT in the last 72 hours. Thyroid Function Tests: No results for input(s): TSH, T4TOTAL, FREET4, T3FREE, THYROIDAB in the last 72 hours. Anemia Panel: No results for input(s): VITAMINB12, FOLATE, FERRITIN, TIBC, IRON, RETICCTPCT in the last 72 hours. Urine analysis:    Component Value Date/Time   COLORURINE YELLOW 05/07/2016 0810   APPEARANCEUR TURBID* 05/07/2016 0810   LABSPEC 1.017 05/07/2016 0810   PHURINE 6.0 05/07/2016 0810   GLUCOSEU NEGATIVE 05/07/2016 0810   HGBUR LARGE* 05/07/2016 0810   BILIRUBINUR NEGATIVE 05/07/2016 0810   KETONESUR NEGATIVE 05/07/2016 0810   PROTEINUR 100* 05/07/2016 0810   UROBILINOGEN 1.0 06/04/2013 1250   NITRITE POSITIVE* 05/07/2016 0810   LEUKOCYTESUR LARGE* 05/07/2016 0810   Sepsis Labs: @LABRCNTIP (procalcitonin:4,lacticidven:4) ) Recent Results (from the past 240 hour(s))  Urine culture     Status: Abnormal (Preliminary result)   Collection Time: 05/07/16  8:10 AM  Result Value Ref Range Status   Specimen Description URINE, CLEAN CATCH  Final   Special Requests NONE  Final   Culture >=100,000 COLONIES/mL ESCHERICHIA COLI (A)  Final   Report Status PENDING  Incomplete  Culture, blood (Routine X 2) w Reflex to ID Panel     Status: None (Preliminary result)   Collection Time: 05/07/16  9:35 AM  Result Value Ref Range Status   Specimen Description BLOOD RIGHT ANTECUBITAL  Final   Special Requests BOTTLES DRAWN AEROBIC AND ANAEROBIC 5 CC EA  Final   Culture   Final    NO GROWTH 1 DAY Performed at [facility]    Report Status PENDING  Incomplete  Culture, blood (Routine X 2) w Reflex to ID Panel     Status: None (Preliminary result)   Collection Time: 05/07/16  1:01 PM  Result Value Ref Range Status   Specimen Description  BLOOD LEFT HAND  Final   Special Requests IN PEDIATRIC BOTTLE 2 CC  Final   Culture   Final    NO GROWTH 1 DAY Performed at [facility]    Report Status PENDING  Incomplete  MRSA PCR Screening     Status: None   Collection Time: 05/07/16  4:00 PM  Result Value Ref Range Status   MRSA by PCR NEGATIVE NEGATIVE Final    Comment:        The GeneXpert MRSA Assay (FDA approved for NASAL specimens only), is one component of a comprehensive MRSA colonization surveillance program. It is not intended to diagnose MRSA infection nor to guide or monitor  treatment for MRSA infections.          Radiology Studies: Ct Abdomen Pelvis W Contrast  05/07/2016  CLINICAL DATA:  Rt flank pain since early am. H/o stones, prev surg for stones, elevated WBC, ovarian ca 1989 with hysterectomy. Prev GB surg Not a stone protocol per Dr Tran^130mL ISOVUE-300 IOPAMIDOL (ISOVUE-300) INJECTION 61% EXAM: CT ABDOMEN AND PELVIS WITH CONTRAST TECHNIQUE: Multidetector CT imaging of the abdomen and pelvis was performed using the standard protocol following bolus administration of intravenous contrast. CONTRAST:  150mL ISOVUE-300 IOPAMIDOL (ISOVUE-300) INJECTION 61% COMPARISON:  Renal ultrasound 05/09/2014 FINDINGS: Lower chest: Minimal bibasilar atelectasis. Within the right lower lobe nodule is 10 x 6 mm on image 16 of series 4. Previously nodule measured 8 x 13 mm no suspicious pulmonary nodules are identified. Heart size is normal. No imaged pericardial effusion or significant coronary artery calcifications. Hepatobiliary: Status postcholecystectomy. Mild intra and extrahepatic biliary duct dilatation following surgery. No focal liver lesion identified. Pancreas: Normal appearance of the pancreas. Spleen: Normal appearance of the spleen. Adrenals/Urinary Tract: Adrenal glands are normal in appearance. There is moderate right-sided hydronephrosis. Right renal parenchymal thinning is noted, indicating long-standing  obstruction. Enhancement of the renal collecting system and right ureter which is dilated to the level of S2. At this level there is abrupt tapering without radiopaque calculus identified. The lower ureter is normal in caliber. No stones identified within the bladder. Left kidney and ureter are normal in appearance. Stomach/Bowel: The stomach and small bowel loops are normal in appearance. Appendix is not well seen and may be obscured or absent. Numerous colonic diverticula are present. No acute diverticulitis identified. No abscess or free air. Vascular/Lymphatic: Moderate atherosclerosis of the abdominal aorta. No aneurysm. No retroperitoneal or mesenteric adenopathy. Reproductive: Status post hysterectomy. Pessary in the vaginal apex. No adnexal mass. No free pelvic fluid. Other: Abdominal wall is unremarkable. Musculoskeletal: Status post posterior fusion L3-L5. Remote wedge compression fracture of L2. IMPRESSION: 1. Moderate right hydronephrosis, likely chronic given the associated renal parenchymal thinning. The tapering at the level of S2 is not associated with calculus at this time and may be related to scarring or urothelial lesion. Malignancy is not excluded. Recommend further urological the evaluation. 2. Colonic diverticulosis. 3. Atherosclerosis of the abdominal aorta. 4. Stable scarring at the lung bases. 5. Postoperative changes in the lumbar spine. 6. Remote wedge compression fracture of L2. Electronically Signed   By: Nolon Nations M.D.   On: 05/07/2016 09:49      Scheduled Meds: . amitriptyline  25 mg Oral QHS  . apixaban  5 mg Oral BID  . carvedilol  3.125 mg Oral BID WC  . cefTRIAXone (ROCEPHIN)  IV  1 g Intravenous Q24H  . diltiazem  240 mg Oral Daily  . flecainide  50 mg Oral BID  . pantoprazole  40 mg Oral Daily  . sodium chloride flush  3 mL Intravenous Q12H   Continuous Infusions:    LOS: 1 day    Time spent in minutes: 24    [place], MD [facility] Pager: [email or web address] Password [facility] 05/08/2016, 5:52 PM

## 2016-05-08 NOTE — Progress Notes (Signed)
ANTICOAGULATION CONSULT NOTE - F/u Consult  Pharmacy Consult for IV heparin (transition from apixaban) Indication: atrial fibrillation  Allergies  Allergen Reactions  . Codeine Nausea Only and Other (See Comments)    hallucinations  . Hydrocodone Nausea Only and Other (See Comments)  . Macrodantin [Nitrofurantoin] Nausea And Vomiting    Patient Measurements: Height: 5\' 7"  (170.2 cm) Weight: 194 lb (87.998 kg) IBW/kg (Calculated) : 61.6 Heparin Dosing Weight: 80.3kg  Vital Signs: Temp: 97.6 F (36.4 C) (07/13 0000) Temp Source: Oral (07/13 0000) BP: 103/62 mmHg (07/13 0000) Pulse Rate: 105 (07/13 0000)  Labs:  Recent Labs  05/07/16 0646 05/07/16 1357 05/08/16 0029  HGB 14.9  --   --   HCT 45.8  --   --   PLT 603*  --   --   APTT  --  29 62*  HEPARINUNFRC  --  1.96*  --   CREATININE 1.16*  --   --     Estimated Creatinine Clearance: 48.5 mL/min (by C-G formula based on Cr of 1.16).   Medical History: Past Medical History  Diagnosis Date  . GERD (gastroesophageal reflux disease)   . Hyperlipidemia   . History of kidney stones   . History of septic shock     2011 and 06-04-2013  w/ acute renal failure  . PAF (paroxysmal atrial fibrillation) [facility]) DX JUNE 2012    CARDIOLOGIST--  DR KRASOWSKI ([facility] , [place])  CHADS2  . Hypertension   . History of uterine cancer     STAGE I  --  S/P TAH W/ BSO  (NO OTHER TX)  . Wears glasses   . Ureteral stricture, right     Medications:  PTA apixaban 5mg  BID, LD 7/11 @ 2000  Assessment: [name] [name] with PMHx Afib on chronic anticoagulation, ovarian cancer,  and kidney stones, presents with flank pain.  Urology consulted.  Pharmacy consulted to transition pt to IV heparin.  Plts high, no issues with hgb.  CrCl > 30.   Today, 7/13 0029 HL=62 no infusion or bleeding problems per RN (slightly below goal)  Goal of Therapy:  Heparin level 0.3-0.7 units/ml  APTT 66-102s Monitor platelets by anticoagulation  protocol: Yes   Plan:  Increase heparin drip to 1150 units/hr  Recheck aPtt and HL in 8 hours F/u APTT levels until they correlate with heparin levels then can use only HLs   Dorrene German 05/08/2016, 2:23 AM

## 2016-05-09 DIAGNOSIS — R7881 Bacteremia: Secondary | ICD-10-CM

## 2016-05-09 LAB — URINE CULTURE: Culture: >=100000 — AB

## 2016-05-09 MED ORDER — CARVEDILOL 6.25 MG PO TABS: 3.1250 mg | ORAL_TABLET | Freq: Two times a day (BID) | ORAL | Status: DC

## 2016-05-09 MED ORDER — CEPHALEXIN 500 MG PO CAPS: 500.0000 mg | ORAL_CAPSULE | Freq: Four times a day (QID) | ORAL | Status: DC

## 2016-05-09 NOTE — Discharge Summary (Signed)
Physician Discharge Summary  [name] [name] [id number] DOB: [birth year] DOA: 05/07/2016  PCP: [name], MD  Admit date: 05/07/2016 Discharge date: 05/18/2016  Admitted From: home  Disposition:  home   Recommendations for Outpatient Follow-up:  1. F/u with Dr Tresa Moore  Home Health:  none  Equipment/Devices:  none    Discharge Condition:  stable   CODE STATUS:  Full code   Diet recommendation:  Heart healthy Consultations:  Urology     Discharge Diagnoses:  Principal Problem:   Sepsis ([place]) Active Problems:   Pyelonephritis   Ureteral stricture, right   Hydronephrosis   Atrial fibrillation with rapid ventricular response (HCC)    Subjective: No complaints of pain or dysuria. No fevers or chills. Urinating normally.   Brief Summary: 75 y/o with a-fib on apixaban, UTI and bactremia in 2011 and 2014 who presented with right flank apin, nausea, vomiting and was found to have a-fib with RVR, a UTI and a CT showing right sided hydronephrosis. Started on IVF, Rocephin and cultures sent. Urology consulted by in ER. Patient taken to OR for right ureteral stent placement and then admitted to step down.   Hospital Course:  Principal Problem:   Sepsis / UTI - given Rocephin- culture growing e coli sensitive to Cephalexin which will be continued on discharge  Active Problems:    Ureteral stricture, right/   Hydronephrosis - s/p stent   - Dr Tresa Moore has seen her in the hospital today- she will f/u with Dr Tresa Moore as outpt    Atrial fibrillation with rapid ventricular response - transitioned back to oral Cardizem and Oral Coreg  - cont Flecanide - cardiology has been following - she was temporarily placed on heparin and has resumed Apixaban  Discharge Instructions  Discharge Instructions    Call MD for:  persistant nausea and vomiting    Complete by:  As directed   Call MD for:  severe uncontrolled pain    Complete by:  As directed   Call MD for:  temperature >100.4     Complete by:  As directed   Diet - low sodium heart healthy    Complete by:  As directed   Increase activity slowly    Complete by:  As directed       Medication List    TAKE these medications   amitriptyline 25 MG tablet Commonly known as:  ELAVIL Take 25 mg by mouth at bedtime.   apixaban 5 MG Tabs tablet Commonly known as:  ELIQUIS Take 5 mg by mouth 2 (two) times daily.   carvedilol 6.25 MG tablet Commonly known as:  COREG Take 0.5 tablets (3.125 mg total) by mouth 2 (two) times daily with a meal.   cephALEXin 500 MG capsule Commonly known as:  KEFLEX Take 1 capsule (500 mg total) by mouth 4 (four) times daily.   diltiazem 240 MG 24 hr capsule Commonly known as:  CARDIZEM CD Take 240 mg by mouth daily.   fenofibrate micronized 134 MG capsule Commonly known as:  LOFIBRA Take 134 mg by mouth daily before breakfast.   flecainide 50 MG tablet Commonly known as:  TAMBOCOR Take 50 mg by mouth 2 (two) times daily.   furosemide 40 MG tablet Commonly known as:  LASIX Take 40 mg by mouth daily.   omeprazole 20 MG capsule Commonly known as:  PRILOSEC Take 20 mg by mouth every morning.   potassium chloride 10 MEQ tablet Commonly known as:  K-DUR,KLOR-CON Take 10 mEq by mouth  daily.       Allergies  Allergen Reactions  . Codeine Nausea Only and Other (See Comments)    hallucinations  . Hydrocodone Nausea Only and Other (See Comments)  . Macrodantin [Nitrofurantoin] Nausea And Vomiting     Procedures/Studies: 1. Cystourethroscopy 2. Right retrograde pyelogram with interpretation 3. Right ureteral stent placement, 43F x 24 cm JJ ureteral stent without string   Ct Abdomen Pelvis W Contrast  Result Date: 05/07/2016 CLINICAL DATA:  Rt flank pain since early am. H/o stones, prev surg for stones, elevated WBC, ovarian ca 1989 with hysterectomy. Prev GB surg Not a stone protocol per Dr Tran^127mL ISOVUE-300 IOPAMIDOL (ISOVUE-300) INJECTION 61% EXAM: CT ABDOMEN AND  PELVIS WITH CONTRAST TECHNIQUE: Multidetector CT imaging of the abdomen and pelvis was performed using the standard protocol following bolus administration of intravenous contrast. CONTRAST:  180mL ISOVUE-300 IOPAMIDOL (ISOVUE-300) INJECTION 61% COMPARISON:  Renal ultrasound 05/09/2014 FINDINGS: Lower chest: Minimal bibasilar atelectasis. Within the right lower lobe nodule is 10 x 6 mm on image 16 of series 4. Previously nodule measured 8 x 13 mm no suspicious pulmonary nodules are identified. Heart size is normal. No imaged pericardial effusion or significant coronary artery calcifications. Hepatobiliary: Status postcholecystectomy. Mild intra and extrahepatic biliary duct dilatation following surgery. No focal liver lesion identified. Pancreas: Normal appearance of the pancreas. Spleen: Normal appearance of the spleen. Adrenals/Urinary Tract: Adrenal glands are normal in appearance. There is moderate right-sided hydronephrosis. Right renal parenchymal thinning is noted, indicating long-standing obstruction. Enhancement of the renal collecting system and right ureter which is dilated to the level of S2. At this level there is abrupt tapering without radiopaque calculus identified. The lower ureter is normal in caliber. No stones identified within the bladder. Left kidney and ureter are normal in appearance. Stomach/Bowel: The stomach and small bowel loops are normal in appearance. Appendix is not well seen and may be obscured or absent. Numerous colonic diverticula are present. No acute diverticulitis identified. No abscess or free air. Vascular/Lymphatic: Moderate atherosclerosis of the abdominal aorta. No aneurysm. No retroperitoneal or mesenteric adenopathy. Reproductive: Status post hysterectomy. Pessary in the vaginal apex. No adnexal mass. No free pelvic fluid. Other: Abdominal wall is unremarkable. Musculoskeletal: Status post posterior fusion L3-L5. Remote wedge compression fracture of L2. IMPRESSION: 1.  Moderate right hydronephrosis, likely chronic given the associated renal parenchymal thinning. The tapering at the level of S2 is not associated with calculus at this time and may be related to scarring or urothelial lesion. Malignancy is not excluded. Recommend further urological the evaluation. 2. Colonic diverticulosis. 3. Atherosclerosis of the abdominal aorta. 4. Stable scarring at the lung bases. 5. Postoperative changes in the lumbar spine. 6. Remote wedge compression fracture of L2. Electronically Signed   By: Nolon Nations M.D.   On: 05/07/2016 09:49       Discharge Exam: Vitals:   05/09/16 0800 05/09/16 1200  BP: 123/72 116/88  Pulse: (!) 103 97  Resp: 17 (!) 21  Temp: 98.3 F (36.8 C) 98 F (36.7 C)   Vitals:   05/09/16 0413 05/09/16 0615 05/09/16 0800 05/09/16 1200  BP:  (!) 124/59 123/72 116/88  Pulse:  99 (!) 103 97  Resp:  (!) 21 17 (!) 21  Temp: 97.8 F (36.6 C)  98.3 F (36.8 C) 98 F (36.7 C)  TempSrc: Oral  Oral Oral  SpO2:  91% 95% 95%  Weight: 92.2 kg (203 lb 4.8 oz)     Height:  General: Pt is alert, awake, not in acute distress Cardiovascular: RRR, S1/S2 +, no rubs, no gallops Respiratory: CTA bilaterally, no wheezing, no rhonchi Abdominal: Soft, NT, ND, bowel sounds + Extremities: no edema, no cyanosis    The results of significant diagnostics from this hospitalization (including imaging, microbiology, ancillary and laboratory) are listed below for reference.     Microbiology: No results found for this or any previous visit (from the past 240 hour(s)).   Labs: BNP (last 3 results) No results for input(s): BNP in the last 8760 hours. Basic Metabolic Panel: No results for input(s): NA, K, CL, CO2, GLUCOSE, BUN, CREATININE, CALCIUM, MG, PHOS in the last 168 hours. Liver Function Tests: No results for input(s): AST, ALT, ALKPHOS, BILITOT, PROT, ALBUMIN in the last 168 hours. No results for input(s): LIPASE, AMYLASE in the last 168  hours. No results for input(s): AMMONIA in the last 168 hours. CBC: No results for input(s): WBC, NEUTROABS, HGB, HCT, MCV, PLT in the last 168 hours. Cardiac Enzymes: No results for input(s): CKTOTAL, CKMB, CKMBINDEX, TROPONINI in the last 168 hours. BNP: Invalid input(s): POCBNP CBG: No results for input(s): GLUCAP in the last 168 hours. D-Dimer No results for input(s): DDIMER in the last 72 hours. Hgb A1c No results for input(s): HGBA1C in the last 72 hours. Lipid Profile No results for input(s): CHOL, HDL, LDLCALC, TRIG, CHOLHDL, LDLDIRECT in the last 72 hours. Thyroid function studies No results for input(s): TSH, T4TOTAL, T3FREE, THYROIDAB in the last 72 hours.  Invalid input(s): FREET3 Anemia work up No results for input(s): VITAMINB12, FOLATE, FERRITIN, TIBC, IRON, RETICCTPCT in the last 72 hours. Urinalysis    Component Value Date/Time   COLORURINE YELLOW 05/07/2016 0810   APPEARANCEUR TURBID (A) 05/07/2016 0810   LABSPEC 1.017 05/07/2016 0810   PHURINE 6.0 05/07/2016 0810   GLUCOSEU NEGATIVE 05/07/2016 0810   HGBUR LARGE (A) 05/07/2016 0810   BILIRUBINUR NEGATIVE 05/07/2016 0810   KETONESUR NEGATIVE 05/07/2016 0810   PROTEINUR 100 (A) 05/07/2016 0810   UROBILINOGEN 1.0 06/04/2013 1250   NITRITE POSITIVE (A) 05/07/2016 0810   LEUKOCYTESUR LARGE (A) 05/07/2016 0810   Sepsis Labs Invalid input(s): PROCALCITONIN,  WBC,  LACTICIDVEN Microbiology No results found for this or any previous visit (from the past 240 hour(s)).   Time coordinating discharge: Over 30 minutes  SIGNED:   Debbe Odea, MD  [facility] 05/18/2016, 3:24 PM Pager   If 7PM-7AM, please contact night-coverage [email or web address] Password [email or web address]

## 2016-05-09 NOTE — Progress Notes (Signed)
05/09/16  1400 Reviewed discharge instructions with patient. Patient verbalized understanding of discharge instructions. Copy of discharge instructions given to patient.

## 2016-05-09 NOTE — Progress Notes (Signed)
2 Days Post-Op  Subjective:  1-  Chronic Partial Right Ureteral Stricture - long h/o partial right ureteral obstruction due to multifocal (mostly distal) sitricutre managed most recently with surveillance with stable GFR and mild hydro x years. Underwent Rt JJ stent emergently 7/12 in setting of obstructed pyelo.  2 - Usosepsis - tachycardia, fevers, leukocytosis, bacteruria on presentation c/w sepsis from urinary course. Decompressed rt kidney with JJ stent as per above. On empiric rocephin and improving clinically. [place] 7/12 with e. Coli / pending. [place] 7/12 NGTD.  Today "[name] [name]" is feeling much improved. No flank pain. Minimal stent colic. No fevers.   Objective: Vital signs in last 24 hours: Temp:  [97.5 F (36.4 C)-98.7 F (37.1 C)] 97.8 F (36.6 C) (07/14 0413) Pulse Rate:  [80-124] 103 (07/14 0800) Resp:  [14-48] 17 (07/14 0800) BP: (81-147)/(49-100) 123/72 mmHg (07/14 0800) SpO2:  [89 %-96 %] 95 % (07/14 0800) Weight:  [92.216 kg (203 lb 4.8 oz)] 92.216 kg (203 lb 4.8 oz) (07/14 0413) Last BM Date:  (PTA)  Intake/Output from previous day: 07/13 0701 - 07/14 0700 In: 240 [P.O.:240] Out: 30 [Urine:30] Intake/Output this shift:    General appearance: alert, cooperative, appears stated age and pt at baseline, husband at bedside.  Eyes: negative Nose: Nares normal. Septum midline. Mucosa normal. No drainage or sinus tenderness. Throat: lips, mucosa, and tongue normal; teeth and gums normal Neck: supple, symmetrical, trachea midline Back: symmetric, no curvature. ROM normal. No CVA tenderness. Resp: non-labored on minimal [place] O2 Cardio: Irregular rythem, rate low 100's by bedside monitor.  GI: soft, non-tender; bowel sounds normal; no masses,  no organomegaly Extremities: extremities normal, atraumatic, no cyanosis or edema Skin: Skin color, texture, turgor normal. No rashes or lesions Lymph nodes: Cervical, supraclavicular, and axillary nodes normal. Neurologic: Grossly  normal  No CVAT   Lab Results:   Recent Labs  05/07/16 0646 05/08/16 0517  WBC 18.1* 17.3*  HGB 14.9 12.5  HCT 45.8 40.4  PLT 603* 469*   BMET  Recent Labs  05/07/16 0646 05/08/16 0517  NA 137 137  K 3.8 4.4  CL 104 108  CO2 22 21*  GLUCOSE 109* 95  BUN 17 17  CREATININE 1.16* 0.88  CALCIUM 9.5 8.4*   PT/INR No results for input(s): LABPROT, INR in the last 72 hours. ABG No results for input(s): PHART, HCO3 in the last 72 hours.  Invalid input(s): PCO2, PO2  Studies/Results: Ct Abdomen Pelvis W Contrast  05/07/2016  CLINICAL DATA:  Rt flank pain since early am. H/o stones, prev surg for stones, elevated WBC, ovarian ca 1989 with hysterectomy. Prev GB surg Not a stone protocol per Dr Tran^16mL ISOVUE-300 IOPAMIDOL (ISOVUE-300) INJECTION 61% EXAM: CT ABDOMEN AND PELVIS WITH CONTRAST TECHNIQUE: Multidetector CT imaging of the abdomen and pelvis was performed using the standard protocol following bolus administration of intravenous contrast. CONTRAST:  155mL ISOVUE-300 IOPAMIDOL (ISOVUE-300) INJECTION 61% COMPARISON:  Renal ultrasound 05/09/2014 FINDINGS: Lower chest: Minimal bibasilar atelectasis. Within the right lower lobe nodule is 10 x 6 mm on image 16 of series 4. Previously nodule measured 8 x 13 mm no suspicious pulmonary nodules are identified. Heart size is normal. No imaged pericardial effusion or significant coronary artery calcifications. Hepatobiliary: Status postcholecystectomy. Mild intra and extrahepatic biliary duct dilatation following surgery. No focal liver lesion identified. Pancreas: Normal appearance of the pancreas. Spleen: Normal appearance of the spleen. Adrenals/Urinary Tract: Adrenal glands are normal in appearance. There is moderate right-sided hydronephrosis. Right renal parenchymal thinning  is noted, indicating long-standing obstruction. Enhancement of the renal collecting system and right ureter which is dilated to the level of S2. At this level  there is abrupt tapering without radiopaque calculus identified. The lower ureter is normal in caliber. No stones identified within the bladder. Left kidney and ureter are normal in appearance. Stomach/Bowel: The stomach and small bowel loops are normal in appearance. Appendix is not well seen and may be obscured or absent. Numerous colonic diverticula are present. No acute diverticulitis identified. No abscess or free air. Vascular/Lymphatic: Moderate atherosclerosis of the abdominal aorta. No aneurysm. No retroperitoneal or mesenteric adenopathy. Reproductive: Status post hysterectomy. Pessary in the vaginal apex. No adnexal mass. No free pelvic fluid. Other: Abdominal wall is unremarkable. Musculoskeletal: Status post posterior fusion L3-L5. Remote wedge compression fracture of L2. IMPRESSION: 1. Moderate right hydronephrosis, likely chronic given the associated renal parenchymal thinning. The tapering at the level of S2 is not associated with calculus at this time and may be related to scarring or urothelial lesion. Malignancy is not excluded. Recommend further urological the evaluation. 2. Colonic diverticulosis. 3. Atherosclerosis of the abdominal aorta. 4. Stable scarring at the lung bases. 5. Postoperative changes in the lumbar spine. 6. Remote wedge compression fracture of L2. Electronically Signed   By: Nolon Nations M.D.   On: 05/07/2016 09:49    Anti-infectives: Anti-infectives    Start     Dose/Rate Route Frequency Ordered Stop   05/08/16 1000  cefTRIAXone (ROCEPHIN) 1 g in dextrose 5 % 50 mL IVPB     1 g 100 mL/hr over 30 Minutes Intravenous Every 24 hours 05/07/16 0939     05/07/16 0930  cefTRIAXone (ROCEPHIN) 2 g in dextrose 5 % 50 mL IVPB     2 g 100 mL/hr over 30 Minutes Intravenous  Once 05/07/16 0915 05/07/16 1115      Assessment/Plan:  1-  Chronic Partial Right Ureteral Stricture - pt well versed in chronic management options including surveillance (risk of slow renal demise  and infectious), chronic stent changes (need for biannual anesthetics), and revision surgery (peri-operative risks).   Presently decompressed with JJ stent and will continue for now, will rediscuss in outpatient setting.   2 - Usosepsis - improving clnically on present rocephin. As she has h/o variable drug resistance previously, I feel safest to have final C/S so appropriate PO antimicrobials can be continued at DC for 7-14 day total course.  Greatly appreciate hospitalist comangemnt. We will follow pt prn for remainder of hospital stay.   We will arrange Urol follow up.  Please call me directly with questions anytime.   [facility], Tasia Liz 05/09/2016

## 2016-05-12 LAB — CULTURE, BLOOD (ROUTINE X 2)
Culture: NO GROWTH
Culture: NO GROWTH

## 2016-05-15 DIAGNOSIS — R310 Gross hematuria: Secondary | ICD-10-CM | POA: Diagnosis not present

## 2016-05-22 DIAGNOSIS — I5032 Chronic diastolic (congestive) heart failure: Secondary | ICD-10-CM | POA: Diagnosis not present

## 2016-05-22 DIAGNOSIS — E785 Hyperlipidemia, unspecified: Secondary | ICD-10-CM | POA: Diagnosis not present

## 2016-05-22 DIAGNOSIS — I480 Paroxysmal atrial fibrillation: Secondary | ICD-10-CM | POA: Diagnosis not present

## 2016-05-22 DIAGNOSIS — Z6829 Body mass index (BMI) 29.0-29.9, adult: Secondary | ICD-10-CM | POA: Diagnosis not present

## 2016-05-26 DIAGNOSIS — N952 Postmenopausal atrophic vaginitis: Secondary | ICD-10-CM | POA: Diagnosis not present

## 2016-05-26 DIAGNOSIS — N200 Calculus of kidney: Secondary | ICD-10-CM | POA: Diagnosis not present

## 2016-05-26 DIAGNOSIS — N3020 Other chronic cystitis without hematuria: Secondary | ICD-10-CM | POA: Diagnosis not present

## 2016-05-26 DIAGNOSIS — N1330 Unspecified hydronephrosis: Secondary | ICD-10-CM | POA: Diagnosis not present

## 2016-05-27 DIAGNOSIS — E785 Hyperlipidemia, unspecified: Secondary | ICD-10-CM | POA: Diagnosis not present

## 2016-05-27 DIAGNOSIS — Z79899 Other long term (current) drug therapy: Secondary | ICD-10-CM | POA: Diagnosis not present

## 2016-05-27 DIAGNOSIS — G43109 Migraine with aura, not intractable, without status migrainosus: Secondary | ICD-10-CM | POA: Diagnosis not present

## 2016-05-27 DIAGNOSIS — Z0000 Encounter for general adult medical examination without abnormal findings: Secondary | ICD-10-CM | POA: Diagnosis not present

## 2016-05-27 DIAGNOSIS — M8580 Other specified disorders of bone density and structure, unspecified site: Secondary | ICD-10-CM | POA: Diagnosis not present

## 2016-05-27 DIAGNOSIS — K219 Gastro-esophageal reflux disease without esophagitis: Secondary | ICD-10-CM | POA: Diagnosis not present

## 2016-06-02 ENCOUNTER — Other Ambulatory Visit: Payer: Self-pay | Admitting: Urology

## 2016-06-02 DIAGNOSIS — N1330 Unspecified hydronephrosis: Secondary | ICD-10-CM

## 2016-06-09 ENCOUNTER — Encounter (HOSPITAL_COMMUNITY)
Admission: RE | Admit: 2016-06-09 | Discharge: 2016-06-09 | Disposition: A | Discharge status: Home / Self Care | Payer: Medicare Other | Source: Ambulatory Visit | Attending: Urology | Admitting: Urology

## 2016-06-09 DIAGNOSIS — N1330 Unspecified hydronephrosis: Secondary | ICD-10-CM | POA: Diagnosis not present

## 2016-06-09 MED ORDER — FUROSEMIDE 10 MG/ML IJ SOLN
INTRAMUSCULAR | Status: AC
  Filled 2016-06-09: qty 4

## 2016-06-09 MED ORDER — TECHNETIUM TC 99M MERTIATIDE
5.2200 | Freq: Once | INTRAVENOUS | Status: AC | PRN
  Administered 2016-06-09: 5.22 via INTRAVENOUS

## 2016-06-09 MED ORDER — FUROSEMIDE 10 MG/ML IJ SOLN
20.0000 mg | Freq: Once | INTRAMUSCULAR | Status: AC
  Administered 2016-06-09: 20 mg via INTRAVENOUS

## 2016-06-24 DIAGNOSIS — N200 Calculus of kidney: Secondary | ICD-10-CM | POA: Diagnosis not present

## 2016-06-24 DIAGNOSIS — R8271 Bacteriuria: Secondary | ICD-10-CM | POA: Diagnosis not present

## 2016-06-25 DIAGNOSIS — I5032 Chronic diastolic (congestive) heart failure: Secondary | ICD-10-CM | POA: Diagnosis not present

## 2016-06-25 DIAGNOSIS — I480 Paroxysmal atrial fibrillation: Secondary | ICD-10-CM | POA: Diagnosis not present

## 2016-06-25 DIAGNOSIS — E785 Hyperlipidemia, unspecified: Secondary | ICD-10-CM | POA: Diagnosis not present

## 2016-06-25 DIAGNOSIS — Z6830 Body mass index (BMI) 30.0-30.9, adult: Secondary | ICD-10-CM | POA: Diagnosis not present

## 2016-06-26 ENCOUNTER — Other Ambulatory Visit: Payer: Self-pay

## 2016-07-02 DIAGNOSIS — M978XXA Periprosthetic fracture around other internal prosthetic joint, initial encounter: Secondary | ICD-10-CM | POA: Diagnosis not present

## 2016-07-08 DIAGNOSIS — R8271 Bacteriuria: Secondary | ICD-10-CM | POA: Diagnosis not present

## 2016-07-08 DIAGNOSIS — N200 Calculus of kidney: Secondary | ICD-10-CM | POA: Diagnosis not present

## 2016-07-08 DIAGNOSIS — N1330 Unspecified hydronephrosis: Secondary | ICD-10-CM | POA: Diagnosis not present

## 2016-07-14 DIAGNOSIS — Z23 Encounter for immunization: Secondary | ICD-10-CM | POA: Diagnosis not present

## 2016-07-14 DIAGNOSIS — E860 Dehydration: Secondary | ICD-10-CM | POA: Diagnosis not present

## 2016-07-14 DIAGNOSIS — Z0000 Encounter for general adult medical examination without abnormal findings: Secondary | ICD-10-CM | POA: Diagnosis not present

## 2016-07-14 DIAGNOSIS — R300 Dysuria: Secondary | ICD-10-CM | POA: Diagnosis not present

## 2016-07-30 DIAGNOSIS — I5032 Chronic diastolic (congestive) heart failure: Secondary | ICD-10-CM | POA: Diagnosis not present

## 2016-07-30 DIAGNOSIS — Z79899 Other long term (current) drug therapy: Secondary | ICD-10-CM

## 2016-07-30 DIAGNOSIS — Z6830 Body mass index (BMI) 30.0-30.9, adult: Secondary | ICD-10-CM | POA: Diagnosis not present

## 2016-07-30 DIAGNOSIS — Z5181 Encounter for therapeutic drug level monitoring: Secondary | ICD-10-CM

## 2016-07-30 DIAGNOSIS — E785 Hyperlipidemia, unspecified: Secondary | ICD-10-CM | POA: Diagnosis not present

## 2016-07-30 DIAGNOSIS — I480 Paroxysmal atrial fibrillation: Secondary | ICD-10-CM | POA: Diagnosis not present

## 2016-07-30 HISTORY — DX: Encounter for therapeutic drug level monitoring: Z79.899

## 2016-07-30 HISTORY — DX: Encounter for therapeutic drug level monitoring: Z51.81

## 2016-08-04 DIAGNOSIS — R300 Dysuria: Secondary | ICD-10-CM | POA: Diagnosis not present

## 2016-08-12 DIAGNOSIS — I480 Paroxysmal atrial fibrillation: Secondary | ICD-10-CM | POA: Diagnosis not present

## 2016-08-12 DIAGNOSIS — Z79899 Other long term (current) drug therapy: Secondary | ICD-10-CM | POA: Diagnosis not present

## 2016-08-12 DIAGNOSIS — Z5181 Encounter for therapeutic drug level monitoring: Secondary | ICD-10-CM | POA: Diagnosis not present

## 2016-08-15 DIAGNOSIS — I4891 Unspecified atrial fibrillation: Secondary | ICD-10-CM | POA: Diagnosis not present

## 2016-08-18 DIAGNOSIS — N8111 Cystocele, midline: Secondary | ICD-10-CM | POA: Diagnosis not present

## 2016-08-27 DIAGNOSIS — L219 Seborrheic dermatitis, unspecified: Secondary | ICD-10-CM | POA: Diagnosis not present

## 2016-08-27 DIAGNOSIS — L820 Inflamed seborrheic keratosis: Secondary | ICD-10-CM | POA: Diagnosis not present

## 2016-08-27 DIAGNOSIS — L299 Pruritus, unspecified: Secondary | ICD-10-CM | POA: Diagnosis not present

## 2016-08-28 DIAGNOSIS — Z79899 Other long term (current) drug therapy: Secondary | ICD-10-CM | POA: Diagnosis not present

## 2016-08-28 DIAGNOSIS — E785 Hyperlipidemia, unspecified: Secondary | ICD-10-CM | POA: Diagnosis not present

## 2016-08-28 DIAGNOSIS — I5032 Chronic diastolic (congestive) heart failure: Secondary | ICD-10-CM | POA: Diagnosis not present

## 2016-08-28 DIAGNOSIS — Z5181 Encounter for therapeutic drug level monitoring: Secondary | ICD-10-CM | POA: Diagnosis not present

## 2016-08-28 DIAGNOSIS — I480 Paroxysmal atrial fibrillation: Secondary | ICD-10-CM | POA: Diagnosis not present

## 2016-08-28 DIAGNOSIS — Z6830 Body mass index (BMI) 30.0-30.9, adult: Secondary | ICD-10-CM | POA: Diagnosis not present

## 2016-09-10 DIAGNOSIS — R300 Dysuria: Secondary | ICD-10-CM | POA: Diagnosis not present

## 2016-09-17 DIAGNOSIS — R300 Dysuria: Secondary | ICD-10-CM | POA: Diagnosis not present

## 2016-10-06 DIAGNOSIS — Z79899 Other long term (current) drug therapy: Secondary | ICD-10-CM | POA: Diagnosis not present

## 2016-10-06 DIAGNOSIS — I5032 Chronic diastolic (congestive) heart failure: Secondary | ICD-10-CM | POA: Diagnosis not present

## 2016-10-06 DIAGNOSIS — Z6829 Body mass index (BMI) 29.0-29.9, adult: Secondary | ICD-10-CM | POA: Diagnosis not present

## 2016-10-06 DIAGNOSIS — E785 Hyperlipidemia, unspecified: Secondary | ICD-10-CM | POA: Diagnosis not present

## 2016-10-06 DIAGNOSIS — I480 Paroxysmal atrial fibrillation: Secondary | ICD-10-CM | POA: Diagnosis not present

## 2016-10-06 DIAGNOSIS — Z5181 Encounter for therapeutic drug level monitoring: Secondary | ICD-10-CM | POA: Diagnosis not present

## 2016-10-14 DIAGNOSIS — N1330 Unspecified hydronephrosis: Secondary | ICD-10-CM | POA: Diagnosis not present

## 2016-10-14 DIAGNOSIS — N3020 Other chronic cystitis without hematuria: Secondary | ICD-10-CM | POA: Diagnosis not present

## 2016-10-14 DIAGNOSIS — N130 Hydronephrosis with ureteropelvic junction obstruction: Secondary | ICD-10-CM | POA: Diagnosis not present

## 2016-10-14 DIAGNOSIS — N952 Postmenopausal atrophic vaginitis: Secondary | ICD-10-CM | POA: Diagnosis not present

## 2016-10-14 DIAGNOSIS — N200 Calculus of kidney: Secondary | ICD-10-CM | POA: Diagnosis not present

## 2016-10-16 ENCOUNTER — Other Ambulatory Visit: Payer: Self-pay | Admitting: Urology

## 2016-10-29 DIAGNOSIS — I129 Hypertensive chronic kidney disease with stage 1 through stage 4 chronic kidney disease, or unspecified chronic kidney disease: Secondary | ICD-10-CM | POA: Diagnosis not present

## 2016-10-29 DIAGNOSIS — Z79899 Other long term (current) drug therapy: Secondary | ICD-10-CM | POA: Diagnosis not present

## 2016-10-29 DIAGNOSIS — G43909 Migraine, unspecified, not intractable, without status migrainosus: Secondary | ICD-10-CM | POA: Diagnosis not present

## 2016-10-29 DIAGNOSIS — Z01818 Encounter for other preprocedural examination: Secondary | ICD-10-CM | POA: Diagnosis not present

## 2016-10-29 DIAGNOSIS — I480 Paroxysmal atrial fibrillation: Secondary | ICD-10-CM | POA: Diagnosis not present

## 2016-10-29 DIAGNOSIS — N189 Chronic kidney disease, unspecified: Secondary | ICD-10-CM | POA: Diagnosis not present

## 2016-10-29 DIAGNOSIS — K219 Gastro-esophageal reflux disease without esophagitis: Secondary | ICD-10-CM | POA: Diagnosis not present

## 2016-10-29 DIAGNOSIS — R7989 Other specified abnormal findings of blood chemistry: Secondary | ICD-10-CM | POA: Diagnosis not present

## 2016-10-29 DIAGNOSIS — Z5309 Procedure and treatment not carried out because of other contraindication: Secondary | ICD-10-CM | POA: Diagnosis not present

## 2016-10-29 DIAGNOSIS — E785 Hyperlipidemia, unspecified: Secondary | ICD-10-CM | POA: Diagnosis not present

## 2016-10-31 DIAGNOSIS — J111 Influenza due to unidentified influenza virus with other respiratory manifestations: Secondary | ICD-10-CM | POA: Diagnosis not present

## 2016-11-03 DIAGNOSIS — D649 Anemia, unspecified: Secondary | ICD-10-CM | POA: Diagnosis not present

## 2016-11-06 DIAGNOSIS — N3020 Other chronic cystitis without hematuria: Secondary | ICD-10-CM | POA: Diagnosis not present

## 2016-11-06 DIAGNOSIS — R300 Dysuria: Secondary | ICD-10-CM | POA: Diagnosis not present

## 2016-11-21 ENCOUNTER — Encounter (HOSPITAL_BASED_OUTPATIENT_CLINIC_OR_DEPARTMENT_OTHER): Payer: Self-pay | Admitting: *Deleted

## 2016-11-21 NOTE — Progress Notes (Signed)
Pt instructed npo pmn 1/30 x am meds w a sip of water.  No lasix.  To [facility] 1/31 @ 1115.  Needs istat 8 on arrival.  ekg in epic. Pt states new dx of elevated plts. Pt scheduled to see Dr. Bobby Rumpf, hematologist, 11/24/16.  Pt to call Monday w result of appt. Stated D. Rosalyn Gess, FNP aware of elevation.

## 2016-11-24 DIAGNOSIS — D473 Essential (hemorrhagic) thrombocythemia: Secondary | ICD-10-CM | POA: Diagnosis not present

## 2016-11-24 DIAGNOSIS — I4891 Unspecified atrial fibrillation: Secondary | ICD-10-CM | POA: Diagnosis not present

## 2016-11-24 DIAGNOSIS — D509 Iron deficiency anemia, unspecified: Secondary | ICD-10-CM | POA: Diagnosis not present

## 2016-11-25 DIAGNOSIS — D509 Iron deficiency anemia, unspecified: Secondary | ICD-10-CM | POA: Diagnosis not present

## 2016-11-26 ENCOUNTER — Encounter (HOSPITAL_BASED_OUTPATIENT_CLINIC_OR_DEPARTMENT_OTHER): Payer: Self-pay | Admitting: *Deleted

## 2016-11-26 ENCOUNTER — Ambulatory Visit (HOSPITAL_BASED_OUTPATIENT_CLINIC_OR_DEPARTMENT_OTHER)
Admission: RE | Admit: 2016-11-26 | Discharge: 2016-11-26 | Disposition: A | Discharge status: Home / Self Care | Payer: Medicare Other | Source: Ambulatory Visit | Attending: Urology | Admitting: Urology

## 2016-11-26 ENCOUNTER — Ambulatory Visit (HOSPITAL_BASED_OUTPATIENT_CLINIC_OR_DEPARTMENT_OTHER): Payer: Medicare Other | Admitting: Anesthesiology

## 2016-11-26 ENCOUNTER — Encounter (HOSPITAL_BASED_OUTPATIENT_CLINIC_OR_DEPARTMENT_OTHER)
Admission: RE | Disposition: A | Discharge status: Home / Self Care | Payer: Self-pay | Source: Ambulatory Visit | Attending: Urology

## 2016-11-26 DIAGNOSIS — Z79899 Other long term (current) drug therapy: Secondary | ICD-10-CM | POA: Diagnosis not present

## 2016-11-26 DIAGNOSIS — I252 Old myocardial infarction: Secondary | ICD-10-CM | POA: Diagnosis not present

## 2016-11-26 DIAGNOSIS — Z8249 Family history of ischemic heart disease and other diseases of the circulatory system: Secondary | ICD-10-CM | POA: Insufficient documentation

## 2016-11-26 DIAGNOSIS — Z87442 Personal history of urinary calculi: Secondary | ICD-10-CM | POA: Insufficient documentation

## 2016-11-26 DIAGNOSIS — E785 Hyperlipidemia, unspecified: Secondary | ICD-10-CM | POA: Insufficient documentation

## 2016-11-26 DIAGNOSIS — Z981 Arthrodesis status: Secondary | ICD-10-CM | POA: Diagnosis not present

## 2016-11-26 DIAGNOSIS — D473 Essential (hemorrhagic) thrombocythemia: Secondary | ICD-10-CM | POA: Diagnosis not present

## 2016-11-26 DIAGNOSIS — Z9889 Other specified postprocedural states: Secondary | ICD-10-CM | POA: Insufficient documentation

## 2016-11-26 DIAGNOSIS — Z881 Allergy status to other antibiotic agents status: Secondary | ICD-10-CM | POA: Insufficient documentation

## 2016-11-26 DIAGNOSIS — Z8542 Personal history of malignant neoplasm of other parts of uterus: Secondary | ICD-10-CM | POA: Insufficient documentation

## 2016-11-26 DIAGNOSIS — Z7901 Long term (current) use of anticoagulants: Secondary | ICD-10-CM | POA: Diagnosis not present

## 2016-11-26 DIAGNOSIS — K219 Gastro-esophageal reflux disease without esophagitis: Secondary | ICD-10-CM | POA: Diagnosis not present

## 2016-11-26 DIAGNOSIS — Z885 Allergy status to narcotic agent status: Secondary | ICD-10-CM | POA: Diagnosis not present

## 2016-11-26 DIAGNOSIS — I10 Essential (primary) hypertension: Secondary | ICD-10-CM | POA: Diagnosis not present

## 2016-11-26 DIAGNOSIS — Z9049 Acquired absence of other specified parts of digestive tract: Secondary | ICD-10-CM | POA: Diagnosis not present

## 2016-11-26 DIAGNOSIS — N131 Hydronephrosis with ureteral stricture, not elsewhere classified: Secondary | ICD-10-CM | POA: Insufficient documentation

## 2016-11-26 DIAGNOSIS — N359 Urethral stricture, unspecified: Secondary | ICD-10-CM | POA: Diagnosis not present

## 2016-11-26 DIAGNOSIS — I480 Paroxysmal atrial fibrillation: Secondary | ICD-10-CM | POA: Insufficient documentation

## 2016-11-26 HISTORY — DX: Hematuria, unspecified: R31.9

## 2016-11-26 HISTORY — PX: CYSTOSCOPY W/ URETERAL STENT PLACEMENT: SHX1429

## 2016-11-26 HISTORY — DX: Dyspnea, unspecified: R06.00

## 2016-11-26 LAB — POCT I-STAT, CHEM 8
BUN: 13 mg/dL (ref 6–20)
Calcium, Ion: 1.27 mmol/L (ref 1.15–1.40)
Chloride: 105 mmol/L (ref 101–111)
Creatinine, Ser: 1 mg/dL (ref 0.44–1.00)
Glucose, Bld: 101 mg/dL — ABNORMAL HIGH (ref 65–99)
HCT: 31 % — ABNORMAL LOW (ref 36.0–46.0)
Hemoglobin: 10.5 g/dL — ABNORMAL LOW (ref 12.0–15.0)
Potassium: 3.7 mmol/L (ref 3.5–5.1)
Sodium: 141 mmol/L (ref 135–145)
TCO2: 23 mmol/L (ref 0–100)

## 2016-11-26 SURGERY — CYSTOSCOPY, WITH RETROGRADE PYELOGRAM AND URETERAL STENT INSERTION
Anesthesia: General | Site: Renal | Laterality: Right

## 2016-11-26 MED ORDER — FENTANYL CITRATE (PF) 100 MCG/2ML IJ SOLN
25.0000 ug | INTRAMUSCULAR | Status: DC | PRN
  Filled 2016-11-26: qty 1

## 2016-11-26 MED ORDER — LACTATED RINGERS IV SOLN
INTRAVENOUS | Status: DC
  Administered 2016-11-26 (×2): via INTRAVENOUS
  Filled 2016-11-26: qty 1000

## 2016-11-26 MED ORDER — GENTAMICIN IN SALINE 1.6-0.9 MG/ML-% IV SOLN
80.0000 mg | INTRAVENOUS | Status: DC
  Filled 2016-11-26: qty 50

## 2016-11-26 MED ORDER — PROPOFOL 10 MG/ML IV BOLUS
INTRAVENOUS | Status: DC | PRN
  Administered 2016-11-26: 50 mg via INTRAVENOUS
  Administered 2016-11-26: 150 mg via INTRAVENOUS

## 2016-11-26 MED ORDER — GENTAMICIN SULFATE 40 MG/ML IJ SOLN
5.0000 mg/kg | Freq: Once | INTRAMUSCULAR | Status: AC
  Administered 2016-11-26: 360 mg via INTRAVENOUS
  Filled 2016-11-26: qty 9

## 2016-11-26 MED ORDER — PROPOFOL 10 MG/ML IV BOLUS
INTRAVENOUS | Status: AC
  Filled 2016-11-26: qty 20

## 2016-11-26 MED ORDER — ONDANSETRON HCL 4 MG/2ML IJ SOLN
INTRAMUSCULAR | Status: DC | PRN
  Administered 2016-11-26: 4 mg via INTRAVENOUS

## 2016-11-26 MED ORDER — IOHEXOL 300 MG/ML  SOLN
INTRAMUSCULAR | Status: DC | PRN
  Administered 2016-11-26: 10 mL

## 2016-11-26 MED ORDER — MIDAZOLAM HCL 5 MG/5ML IJ SOLN
INTRAMUSCULAR | Status: DC | PRN
  Administered 2016-11-26 (×2): 0.5 mg via INTRAVENOUS

## 2016-11-26 MED ORDER — DEXAMETHASONE SODIUM PHOSPHATE 10 MG/ML IJ SOLN
INTRAMUSCULAR | Status: AC
  Filled 2016-11-26: qty 1

## 2016-11-26 MED ORDER — ONDANSETRON HCL 4 MG/2ML IJ SOLN
INTRAMUSCULAR | Status: AC
  Filled 2016-11-26: qty 2

## 2016-11-26 MED ORDER — FENTANYL CITRATE (PF) 100 MCG/2ML IJ SOLN
INTRAMUSCULAR | Status: AC
  Filled 2016-11-26: qty 2

## 2016-11-26 MED ORDER — DEXAMETHASONE SODIUM PHOSPHATE 4 MG/ML IJ SOLN
INTRAMUSCULAR | Status: DC | PRN
  Administered 2016-11-26: 10 mg via INTRAVENOUS

## 2016-11-26 MED ORDER — PHENYLEPHRINE HCL 10 MG/ML IJ SOLN
INTRAMUSCULAR | Status: DC | PRN
  Administered 2016-11-26 (×3): 80 ug via INTRAVENOUS

## 2016-11-26 MED ORDER — LIDOCAINE 2% (20 MG/ML) 5 ML SYRINGE
INTRAMUSCULAR | Status: DC | PRN
  Administered 2016-11-26: 70 mg via INTRAVENOUS

## 2016-11-26 MED ORDER — SODIUM CHLORIDE 0.9 % IR SOLN
Status: DC | PRN
  Administered 2016-11-26: 1000 mL

## 2016-11-26 MED ORDER — MIDAZOLAM HCL 2 MG/2ML IJ SOLN
INTRAMUSCULAR | Status: AC
  Filled 2016-11-26: qty 2

## 2016-11-26 MED ORDER — PHENYLEPHRINE 40 MCG/ML (10ML) SYRINGE FOR IV PUSH (FOR BLOOD PRESSURE SUPPORT)
PREFILLED_SYRINGE | INTRAVENOUS | Status: AC
  Filled 2016-11-26: qty 10

## 2016-11-26 MED ORDER — EPHEDRINE 5 MG/ML INJ
INTRAVENOUS | Status: AC
  Filled 2016-11-26: qty 10

## 2016-11-26 MED ORDER — TRAMADOL HCL 50 MG PO TABS: 50.0000 mg | ORAL_TABLET | Freq: Four times a day (QID) | ORAL | 0 refills | Status: DC | PRN

## 2016-11-26 MED ORDER — FENTANYL CITRATE (PF) 100 MCG/2ML IJ SOLN
INTRAMUSCULAR | Status: DC | PRN
  Administered 2016-11-26: 25 ug via INTRAVENOUS

## 2016-11-26 SURGICAL SUPPLY — 26 items
BAG DRAIN URO-CYSTO SKYTR STRL (DRAIN) ×2 IMPLANT
BASKET DAKOTA 1.9FR 11X120 (BASKET) IMPLANT
BASKET LASER NITINOL 1.9FR (BASKET) IMPLANT
BASKET ZERO TIP NITINOL 2.4FR (BASKET) IMPLANT
CATH INTERMIT  6FR 70CM (CATHETERS) ×2 IMPLANT
CLOTH BEACON ORANGE TIMEOUT ST (SAFETY) ×2 IMPLANT
FIBER LASER FLEXIVA 365 (UROLOGICAL SUPPLIES) IMPLANT
FIBER LASER TRAC TIP (UROLOGICAL SUPPLIES) IMPLANT
GLOVE BIO SURGEON STRL SZ7.5 (GLOVE) ×2 IMPLANT
GLOVE BIOGEL PI IND STRL 7.5 (GLOVE) ×2 IMPLANT
GLOVE BIOGEL PI INDICATOR 7.5 (GLOVE) ×2
GOWN STRL REUS W/ TWL LRG LVL3 (GOWN DISPOSABLE) IMPLANT
GOWN STRL REUS W/TWL LRG LVL3 (GOWN DISPOSABLE) ×4 IMPLANT
GUIDEWIRE ANG ZIPWIRE 038X150 (WIRE) ×2 IMPLANT
GUIDEWIRE STR DUAL SENSOR (WIRE) IMPLANT
IV NS 1000ML (IV SOLUTION) ×1
IV NS 1000ML BAXH (IV SOLUTION) ×1 IMPLANT
IV NS IRRIG 3000ML ARTHROMATIC (IV SOLUTION) IMPLANT
KIT ROOM TURNOVER WOR (KITS) ×2 IMPLANT
MANIFOLD NEPTUNE II (INSTRUMENTS) ×2 IMPLANT
NS IRRIG 500ML POUR BTL (IV SOLUTION) IMPLANT
PACK CYSTO (CUSTOM PROCEDURE TRAY) ×2 IMPLANT
STENT POLARIS LOOP 7FR X 24 CM (STENTS) ×2 IMPLANT
SYRINGE 10CC LL (SYRINGE) ×2 IMPLANT
TUBE CONNECTING 12X1/4 (SUCTIONS) IMPLANT
TUBE FEEDING 8FR 16IN STR KANG (MISCELLANEOUS) IMPLANT

## 2016-11-26 NOTE — Discharge Instructions (Signed)
1 - You may have urinary urgency (bladder spasms) and bloody urine on / off with stent in place. This is normal.  2 - Call MD or go to ER for fever >102, severe pain / nausea / vomiting not relieved by medications, or acute change in medical status   Post Anesthesia Home Care Instructions  Activity: Get plenty of rest for the remainder of the day. A responsible adult should stay with you for 24 hours following the procedure.  For the next 24 hours, DO NOT: -Drive a car -Paediatric nurse -Drink alcoholic beverages -Take any medication unless instructed by your physician -Make any legal decisions or sign important papers.  Meals: Start with liquid foods such as gelatin or soup. Progress to regular foods as tolerated. Avoid greasy, spicy, heavy foods. If nausea and/or vomiting occur, drink only clear liquids until the nausea and/or vomiting subsides. Call your physician if vomiting continues.  Special Instructions/Symptoms: Your throat may feel dry or sore from the anesthesia or the breathing tube placed in your throat during surgery. If this causes discomfort, gargle with warm salt water. The discomfort should disappear within 24 hours.  If you had a scopolamine patch placed behind your ear for the management of post- operative nausea and/or vomiting:  1. The medication in the patch is effective for 72 hours, after which it should be removed.  Wrap patch in a tissue and discard in the trash. Wash hands thoroughly with soap and water. 2. You may remove the patch earlier than 72 hours if you experience unpleasant side effects which may include dry mouth, dizziness or visual disturbances. 3. Avoid touching the patch. Wash your hands with soap and water after contact with the patch.

## 2016-11-26 NOTE — H&P (Signed)
[name] [name] is an 76 y.o. female.    Chief Complaint: Pre-op RIGHT Ureteral Stent Change  HPI:   1 - Rt Chronic Hydronephrosis / Partial Ureteral Sricture- Multiple prior stone procedures on right including SWL, ureteroscopy, and neph tube. Concern for high grade distal stricure 05/2013 during episode of urosepsis treated with neph tube by antegrade nephrostogram. Formal diagnostic ureteroscopy 02/2014 with several areas of relative narrowing (distal and proximal) but NO frank stricture (accomodated 39F sheath to UPJ).    Recent Imaging   03/2014 Renal US - mod rt hydro, max pox ureteral diameter 1.8cm.   12/2014 Renal US - mod rt hydro, max renal pelvis diameter 1.8cm, max prox ureter 0.75cm   04/2016 Contrast CT - mod Rt hydro to just below iliacs, stable Lt non-complex cysts --> 7x24 JJ stent placed. 1 artery/ 1 vein Rt renovascular anatomy.   05/2016 - Renogram Rt 42% / Lt 58% relative function    2 - Recurrent Nephrolithiasis - Prior composition CaPO4, CaOx   Pre 2014 - URS x several, open ureterolithotomy on Left, SWL on right x2  04/2016 - CT stone free     PMH sig for AFib / Eli[place]us, lap chole, hyst, left open ureterolithotomy, ureteroscopy x several, shockwave lithotripsy.    Today Mionna is seen to proceed with elective stent change. No interval fevers.    Past Medical History:  Diagnosis Date  . Anemia   . Dyspnea    w exertion d/t Afib per pt  . Dysrhythmia   . Elevated platelet count ([place])    scheduled appt 11/24/16 to see hematologist,Dr. Bobby Rumpf, in [place]  . Family history of adverse reaction to anesthesia   . GERD (gastroesophageal reflux disease)   . Hematuria    intermittently d/t JJ stent  . History of kidney stones   . History of septic shock    2011 and 06-04-2013  w/ acute renal failure  . History of uterine cancer    STAGE I  --  S/P TAH W/ BSO  (NO OTHER TX)  . Hyperlipidemia   . Hypertension   . PAF (paroxysmal atrial fibrillation) [employer]) DX  JUNE 2012   CARDIOLOGIST--  DR KRASOWSKI ([facility] [place])  CHADS2  . Ureteral stricture, right    has JJ stent  . Wears glasses     Past Surgical History:  Procedure Laterality Date  . CHOLECYSTECTOMY  2010  . CYSTO/  BALLOON DILATION RIGHT URETERAL STRICTURE/ STENT PLACEMENT  06-02-2013  . CYSTO/  BILATERAL RETROGRADE PYELOGRAM/ RIGHT URETEROSCOPY AND STENT PLACEMENT  10-04-2005  . CYSTO/ LEFT URETEROSCOPIC STONE EXTRACTION  04-03-2006  . CYSTOSCOPY W/ URETERAL STENT PLACEMENT Right 02/06/2014   Procedure: CYSTOSCOPY WITH RIGHT RETROGRADE PYELOGRAM RIGHT STENT REMOVAL and REPLACEMENT, Right Ureteroscopy;  Surgeon: Ailene Rud, MD;  [facility];  Service: Urology;  Laterality: Right;  . CYSTOSCOPY WITH LITHOLAPAXY N/A 11/21/2013   Procedure: CYSTOSCOPY WITH LITHOLAPAXY;  Surgeon: Ailene Rud, MD;  Location: [facility];  Service: Urology;  Laterality: N/A;  . CYSTOSCOPY WITH RETROGRADE PYELOGRAM, URETEROSCOPY AND STENT PLACEMENT Bilateral 03/15/2014   Procedure: CYSTOSCOPY WITH BILATERAL RETROGRADE PYELOGRAM, RIGHT DIAGNOSTIC URETEROSCOPY AND STENT EXCHANGE;  Surgeon: Alexis Frock, MD;  Location: [facility];  Service: Urology;  Laterality: Bilateral;  . CYSTOSCOPY WITH STENT PLACEMENT Right 05/07/2016   Procedure: CYSTOSCOPY WITH RETROGRADE PYELOGRAM, URETERAL STENT PLACEMENT;  Surgeon: Franchot Gallo, MD;  Location: [facility];  Service: Urology;  Laterality: Right;  . LUMBAR  FUSION  JULY 2013  . NEPHROLITHOTOMY  X2 YRS AGO  . TOTAL ABDOMINAL HYSTERECTOMY W/ BILATERAL SALPINGOOPHORECTOMY  1989  . TOTAL KNEE ARTHROPLASTY Bilateral 1992  &  1996  . TRANSTHORACIC ECHOCARDIOGRAM  06-07-2013   LVSF 45-50% /  MILD DILATED LA  . URETEROSCOPY Right 11/21/2013   Procedure: URETEROSCOPY WITH STENT REMOVAL AND REPLACEMENT;  Surgeon: Ailene Rud, MD;  Location: [facility];  Service: Urology;  Laterality:  Right;    Family History  Problem Relation Age of Onset  . Heart disease Mother   . Heart failure Mother   . Cancer Mother   . Heart disease Father   . Heart failure Father    Social History:  reports that she has never smoked. She has never used smokeless tobacco. She reports that she does not drink alcohol or use drugs.  Allergies:  Allergies  Allergen Reactions  . Codeine Nausea Only and Other (See Comments)    hallucinations  . Hydrocodone Nausea Only and Other (See Comments)  . Macrodantin [Nitrofurantoin] Nausea And Vomiting    No prescriptions prior to admission.    No results found for this or any previous visit (from the past 48 hour(s)). No results found.  Review of Systems  Constitutional: Negative.   HENT: Negative.   Eyes: Negative.   Respiratory: Negative.   Cardiovascular: Negative.   Gastrointestinal: Negative.   Genitourinary: Positive for urgency.  Musculoskeletal: Negative.   Skin: Negative.   Neurological: Negative.   Endo/Heme/Allergies: Negative.   Psychiatric/Behavioral: Negative.     Height 5\' 7"  (1.702 m), weight 85.7 kg (189 lb). Physical Exam  Constitutional: She appears well-developed.  Stable frailty  HENT:  Head: Normocephalic.  Eyes: Pupils are equal, round, and reactive to light.  Neck: Normal range of motion.  Cardiovascular: Normal rate.   Respiratory: Effort normal.  GI: Soft.  Genitourinary:  Genitourinary Comments: No CVAT  Neurological: She is alert.  Skin: Skin is warm.  Psychiatric: She has a normal mood and affect.     Assessment/Plan  1 - Rt Chronic Hydronephrosis / Partial Ureteral Sricture- proceed as planned with right stent change today. At present this is best balance of maintaining GFR given her poor functional capacity.  Risks, benefits, alternatives, expected peri-op course discussed previously and reiterated today.   Alexis Frock, MD 11/26/2016, 6:51 AM

## 2016-11-26 NOTE — Anesthesia Preprocedure Evaluation (Addendum)
Anesthesia Evaluation  Patient identified by MRN, date of birth, ID band Patient awake    Reviewed: Allergy & Precautions, NPO status , Patient's Chart, lab work & pertinent test results  History of Anesthesia Complications (+) DIFFICULT AIRWAY  Airway Mallampati: II  TM Distance: <3 FB Neck ROM: Limited   Comment: Previous difficult intubation in [place] hosp admit. Pt easily intubated w/ video glidescope 05-07-16 admission Dental  (+) Teeth Intact, Dental Advisory Given   Pulmonary shortness of breath,    breath sounds clear to auscultation       Cardiovascular Exercise Tolerance: Poor hypertension, Pt. on medications and Pt. on home beta blockers + Past MI  + dysrhythmias Atrial Fibrillation  Rhythm:Regular Rate:Normal     Neuro/Psych    GI/Hepatic Neg liver ROS, GERD  ,  Endo/Other    Renal/GU Renal disease     Musculoskeletal   Abdominal   Peds  Hematology  (+) anemia ,   Anesthesia Other Findings   Reproductive/Obstetrics                         Anesthesia Physical Anesthesia Plan  ASA: III  Anesthesia Plan: General   Post-op Pain Management:    Induction: Intravenous  Airway Management Planned: LMA  Additional Equipment:   Intra-op Plan:   Post-operative Plan: Extubation in OR  Informed Consent: I have reviewed the patients History and Physical, chart, labs and discussed the procedure including the risks, benefits and alternatives for the proposed anesthesia with the patient or authorized representative who has indicated his/her understanding and acceptance.   Dental advisory given  Plan Discussed with: CRNA and Anesthesiologist  Anesthesia Plan Comments:         Anesthesia Quick Evaluation

## 2016-11-26 NOTE — Brief Op Note (Signed)
11/26/2016  12:40 PM  PATIENT:  [name] [name]  76 y.o. female  PRE-OPERATIVE DIAGNOSIS:  RIGHT URETERAL STRICTURE  POST-OPERATIVE DIAGNOSIS:  RIGHT URETERAL STRICTURE  PROCEDURE:  Procedure(s): CYSTOSCOPY WITH RETROGRADE PYELOGRAM/URETERAL STENT REPLACEMENT (Right)  SURGEON:  Surgeon(s) and Role:    * Alexis Frock, MD - Primary  PHYSICIAN ASSISTANT:   ASSISTANTS: none   ANESTHESIA:   general  EBL:  No intake/output data recorded.  BLOOD ADMINISTERED:none  DRAINS: none   LOCAL MEDICATIONS USED:  NONE  SPECIMEN:  No Specimen  DISPOSITION OF SPECIMEN:  N/A  COUNTS:  YES  TOURNIQUET:  * No tourniquets in log *  DICTATION: .Other Dictation: Dictation Number [id number]  PLAN OF CARE: Discharge to home after PACU  PATIENT DISPOSITION:  PACU - hemodynamically stable.   Delay start of Pharmacological VTE agent (>24hrs) due to surgical blood loss or risk of bleeding: yes

## 2016-11-26 NOTE — Transfer of Care (Signed)
Immediate Anesthesia Transfer of Care Note  Patient: [name] [name]  Procedure(s) Performed: Procedure(s): CYSTOSCOPY WITH RETROGRADE PYELOGRAM/URETERAL STENT REPLACEMENT (Right)  Patient Location: PACU  Anesthesia Type:General  Level of Consciousness: awake, alert , oriented and patient cooperative  Airway & Oxygen Therapy: Patient Spontanous Breathing and Patient connected to nasal cannula oxygen  Post-op Assessment: Report given to RN and Post -op Vital signs reviewed and stable  Post vital signs: Reviewed and stable  Last Vitals:  Vitals:   11/26/16 1113  BP: 114/70  Pulse: 95  Resp: 16  Temp: 36.8 C    Last Pain:  Vitals:   11/26/16 1113  TempSrc: Oral      Patients Stated Pain Goal: 6 (39/76/73 [id number])  Complications: No apparent anesthesia complications

## 2016-11-26 NOTE — Anesthesia Procedure Notes (Signed)
Procedure Name: LMA Insertion Date/Time: 11/26/2016 12:27 PM Performed by: Wanita Chamberlain Pre-anesthesia Checklist: Patient identified, Timeout performed, Emergency Drugs available, Suction available and Patient being monitored Patient Re-evaluated:Patient Re-evaluated prior to inductionOxygen Delivery Method: Circle system utilized Preoxygenation: Pre-oxygenation with 100% oxygen Intubation Type: IV induction Ventilation: Mask ventilation without difficulty LMA: LMA with gastric port inserted LMA Size: 4.0 Placement Confirmation: breath sounds checked- equal and bilateral and positive ETCO2 Tube secured with: Tape Dental Injury: Teeth and Oropharynx as per pre-operative assessment

## 2016-11-26 NOTE — Progress Notes (Signed)
Iv attempted by robin sauve crna in left wrist and left anticubital..

## 2016-11-27 ENCOUNTER — Encounter (HOSPITAL_BASED_OUTPATIENT_CLINIC_OR_DEPARTMENT_OTHER): Payer: Self-pay | Admitting: Urology

## 2016-11-27 HISTORY — PX: HEMIARTHROPLASTY HIP: SUR652

## 2016-11-27 HISTORY — PX: JOINT REPLACEMENT: SHX530

## 2016-11-27 NOTE — Op Note (Signed)
NAMELEYLANIE, WOODMANSEE             ACCOUNT NO.:  [id number]  MEDICAL RECORD NO.:  [id number]  LOCATION:                                 FACILITY:  PHYSICIAN:  Alexis Frock, MD     DATE OF BIRTH:  05/06/1941  DATE OF PROCEDURE:  11/26/2016                              OPERATIVE REPORT   PREOPERATIVE DIAGNOSIS:  Chronic right ureteral stricture.  PROCEDURE: 1. Cystoscopy, right retrograde pyelogram interpretation. 2. Exchange of right ureteral stent, 7 x 24 Polaris, no tether.  ESTIMATED BLOOD LOSS:  Nil.  COMPLICATION:  None.  SPECIMENS:  Right in situ stent for discard.  FINDINGS: 1. Moderately encrusted mostly distal aspect of the in situ stent. 2. Persistent mostly mid ureteral stricture on the right with mild     hydronephrosis. 3. Successful replacement of right ureteral stent, proximal in renal     pelvis and distal in urinary bladder.  INDICATION:  Ms. Gwinner is a very pleasant, but somewhat unfortunate 76- year-old lady with long history of recurrent stone disease and a chronic right ureteral stricture that has most recently been managed by chronic stenting due to its large and multifocal length.  It has been approximately 6 months since her most recent stent exchange.  She has done well with q.28-month interval and she now presents for exchange today.  Informed consent was obtained and placed in the medical record.  PROCEDURE IN DETAIL:  The patient being [facility] verified. Procedure being right ureteral stent change was confirmed.  Procedure was carried out.  Time-out was performed.  Intravenous antibiotics administered.  General LMA anesthesia introduced.  The patient placed into a low lithotomy position.  Sterile field was created by prepping and draping the patient's vagina, introitus, and proximal thighs using iodine.  Next, cystourethroscopy was performed using a 22-French rigid cystoscope with offset lens.  Inspection of the urinary bladder  revealed distal end of right ureteral stent in situ.  This was moderately encrusted.  Left ureteral orifice was unremarkable.  The bladder was otherwise unremarkable.  Given the encrustation of the stent, cold grasper was used to fracture these encrustations which were then irrigated out via the cystoscope to allow uncurling of the distal stent. It was then grasped and brought out in its entirety, set aside for discard.  It was inspected and intact.  The right ureteral orifice was then cannulated with 6-French end-hole catheter and right retrograde pyelogram was obtained.  Right retrograde pyelogram demonstrated a single right ureter with single-system right kidney.  There was significant narrowing for approximately a quarter of the length of the ureter in its midportion consistent with known chronic stricture.  There was mild hydroureteronephrosis above this.  A 0.038 Zip wire was advanced to the level of the upper pole, over which, a new 7 x 24 Polaris-type stent was placed using fluoroscopic guidance.  Good proximal and distal deployment were noted.  Bladder was emptied per cystoscope. The procedure then terminated.  The patient tolerated the procedure well and no immediate periprocedural complications.  The patient was taken to the postanesthesia care unit in stable condition.    ______________________________ Alexis Frock, MD   ______________________________ Alexis Frock, MD  TM/MEDQ  D:  11/26/2016  T:  11/27/2016  Job:  [id number]

## 2016-11-27 NOTE — Anesthesia Postprocedure Evaluation (Signed)
Anesthesia Post Note  Patient: [name] [name]  Procedure(s) Performed: Procedure(s) (LRB): CYSTOSCOPY WITH RETROGRADE PYELOGRAM/URETERAL STENT REPLACEMENT (Right)  Patient location during evaluation: PACU Anesthesia Type: General Level of consciousness: awake Pain management: pain level controlled Vital Signs Assessment: post-procedure vital signs reviewed and stable Respiratory status: spontaneous breathing Cardiovascular status: stable Anesthetic complications: no       Last Vitals:  Vitals:   11/26/16 1330 11/26/16 1400  BP: (!) 100/54 (!) 101/48  Pulse: 63 (!) 51  Resp: 12 14  Temp:  36.5 C    Last Pain:  Vitals:   11/26/16 1113  TempSrc: Oral                 Mareon Robinette

## 2016-12-02 DIAGNOSIS — D509 Iron deficiency anemia, unspecified: Secondary | ICD-10-CM | POA: Diagnosis not present

## 2016-12-10 DIAGNOSIS — N189 Chronic kidney disease, unspecified: Secondary | ICD-10-CM

## 2016-12-10 DIAGNOSIS — S72001A Fracture of unspecified part of neck of right femur, initial encounter for closed fracture: Secondary | ICD-10-CM | POA: Diagnosis not present

## 2016-12-10 DIAGNOSIS — Z7902 Long term (current) use of antithrombotics/antiplatelets: Secondary | ICD-10-CM | POA: Diagnosis not present

## 2016-12-10 DIAGNOSIS — K219 Gastro-esophageal reflux disease without esophagitis: Secondary | ICD-10-CM | POA: Diagnosis not present

## 2016-12-10 DIAGNOSIS — I10 Essential (primary) hypertension: Secondary | ICD-10-CM | POA: Diagnosis not present

## 2016-12-10 DIAGNOSIS — E7800 Pure hypercholesterolemia, unspecified: Secondary | ICD-10-CM | POA: Diagnosis present

## 2016-12-10 DIAGNOSIS — M25551 Pain in right hip: Secondary | ICD-10-CM | POA: Diagnosis not present

## 2016-12-10 DIAGNOSIS — I480 Paroxysmal atrial fibrillation: Secondary | ICD-10-CM | POA: Diagnosis not present

## 2016-12-10 DIAGNOSIS — S72041A Displaced fracture of base of neck of right femur, initial encounter for closed fracture: Secondary | ICD-10-CM | POA: Diagnosis not present

## 2016-12-10 DIAGNOSIS — Z885 Allergy status to narcotic agent status: Secondary | ICD-10-CM | POA: Diagnosis not present

## 2016-12-10 DIAGNOSIS — S72009A Fracture of unspecified part of neck of unspecified femur, initial encounter for closed fracture: Secondary | ICD-10-CM | POA: Diagnosis not present

## 2016-12-10 DIAGNOSIS — T148XXA Other injury of unspecified body region, initial encounter: Secondary | ICD-10-CM | POA: Diagnosis not present

## 2016-12-10 DIAGNOSIS — I5032 Chronic diastolic (congestive) heart failure: Secondary | ICD-10-CM | POA: Diagnosis not present

## 2016-12-10 DIAGNOSIS — I5030 Unspecified diastolic (congestive) heart failure: Secondary | ICD-10-CM | POA: Diagnosis not present

## 2016-12-10 DIAGNOSIS — I4891 Unspecified atrial fibrillation: Secondary | ICD-10-CM | POA: Diagnosis not present

## 2016-12-10 DIAGNOSIS — S72051A Unspecified fracture of head of right femur, initial encounter for closed fracture: Secondary | ICD-10-CM | POA: Diagnosis not present

## 2016-12-10 DIAGNOSIS — Z79899 Other long term (current) drug therapy: Secondary | ICD-10-CM | POA: Diagnosis not present

## 2016-12-10 DIAGNOSIS — I110 Hypertensive heart disease with heart failure: Secondary | ICD-10-CM | POA: Diagnosis not present

## 2016-12-16 DIAGNOSIS — N189 Chronic kidney disease, unspecified: Secondary | ICD-10-CM | POA: Diagnosis not present

## 2016-12-16 DIAGNOSIS — Z9181 History of falling: Secondary | ICD-10-CM | POA: Diagnosis not present

## 2016-12-16 DIAGNOSIS — I130 Hypertensive heart and chronic kidney disease with heart failure and stage 1 through stage 4 chronic kidney disease, or unspecified chronic kidney disease: Secondary | ICD-10-CM | POA: Diagnosis not present

## 2016-12-16 DIAGNOSIS — S7291XD Unspecified fracture of right femur, subsequent encounter for closed fracture with routine healing: Secondary | ICD-10-CM | POA: Diagnosis not present

## 2016-12-16 DIAGNOSIS — Z7901 Long term (current) use of anticoagulants: Secondary | ICD-10-CM | POA: Diagnosis not present

## 2016-12-16 DIAGNOSIS — I5030 Unspecified diastolic (congestive) heart failure: Secondary | ICD-10-CM | POA: Diagnosis not present

## 2016-12-17 DIAGNOSIS — S7291XD Unspecified fracture of right femur, subsequent encounter for closed fracture with routine healing: Secondary | ICD-10-CM | POA: Diagnosis not present

## 2016-12-17 DIAGNOSIS — Z7901 Long term (current) use of anticoagulants: Secondary | ICD-10-CM | POA: Diagnosis not present

## 2016-12-17 DIAGNOSIS — I5030 Unspecified diastolic (congestive) heart failure: Secondary | ICD-10-CM | POA: Diagnosis not present

## 2016-12-17 DIAGNOSIS — N189 Chronic kidney disease, unspecified: Secondary | ICD-10-CM | POA: Diagnosis not present

## 2016-12-17 DIAGNOSIS — Z9181 History of falling: Secondary | ICD-10-CM | POA: Diagnosis not present

## 2016-12-17 DIAGNOSIS — I130 Hypertensive heart and chronic kidney disease with heart failure and stage 1 through stage 4 chronic kidney disease, or unspecified chronic kidney disease: Secondary | ICD-10-CM | POA: Diagnosis not present

## 2016-12-19 DIAGNOSIS — Z7901 Long term (current) use of anticoagulants: Secondary | ICD-10-CM | POA: Diagnosis not present

## 2016-12-19 DIAGNOSIS — S7291XD Unspecified fracture of right femur, subsequent encounter for closed fracture with routine healing: Secondary | ICD-10-CM | POA: Diagnosis not present

## 2016-12-19 DIAGNOSIS — Z9181 History of falling: Secondary | ICD-10-CM | POA: Diagnosis not present

## 2016-12-19 DIAGNOSIS — N189 Chronic kidney disease, unspecified: Secondary | ICD-10-CM | POA: Diagnosis not present

## 2016-12-19 DIAGNOSIS — I130 Hypertensive heart and chronic kidney disease with heart failure and stage 1 through stage 4 chronic kidney disease, or unspecified chronic kidney disease: Secondary | ICD-10-CM | POA: Diagnosis not present

## 2016-12-19 DIAGNOSIS — I5030 Unspecified diastolic (congestive) heart failure: Secondary | ICD-10-CM | POA: Diagnosis not present

## 2016-12-22 DIAGNOSIS — I130 Hypertensive heart and chronic kidney disease with heart failure and stage 1 through stage 4 chronic kidney disease, or unspecified chronic kidney disease: Secondary | ICD-10-CM | POA: Diagnosis not present

## 2016-12-22 DIAGNOSIS — S7291XD Unspecified fracture of right femur, subsequent encounter for closed fracture with routine healing: Secondary | ICD-10-CM | POA: Diagnosis not present

## 2016-12-22 DIAGNOSIS — N189 Chronic kidney disease, unspecified: Secondary | ICD-10-CM | POA: Diagnosis not present

## 2016-12-22 DIAGNOSIS — I5030 Unspecified diastolic (congestive) heart failure: Secondary | ICD-10-CM | POA: Diagnosis not present

## 2016-12-22 DIAGNOSIS — Z7901 Long term (current) use of anticoagulants: Secondary | ICD-10-CM | POA: Diagnosis not present

## 2016-12-22 DIAGNOSIS — Z9181 History of falling: Secondary | ICD-10-CM | POA: Diagnosis not present

## 2016-12-23 DIAGNOSIS — N200 Calculus of kidney: Secondary | ICD-10-CM | POA: Diagnosis not present

## 2016-12-23 DIAGNOSIS — N1330 Unspecified hydronephrosis: Secondary | ICD-10-CM | POA: Diagnosis not present

## 2016-12-24 DIAGNOSIS — I130 Hypertensive heart and chronic kidney disease with heart failure and stage 1 through stage 4 chronic kidney disease, or unspecified chronic kidney disease: Secondary | ICD-10-CM | POA: Diagnosis not present

## 2016-12-24 DIAGNOSIS — I5030 Unspecified diastolic (congestive) heart failure: Secondary | ICD-10-CM | POA: Diagnosis not present

## 2016-12-24 DIAGNOSIS — N189 Chronic kidney disease, unspecified: Secondary | ICD-10-CM | POA: Diagnosis not present

## 2016-12-24 DIAGNOSIS — Z9181 History of falling: Secondary | ICD-10-CM | POA: Diagnosis not present

## 2016-12-24 DIAGNOSIS — S7291XD Unspecified fracture of right femur, subsequent encounter for closed fracture with routine healing: Secondary | ICD-10-CM | POA: Diagnosis not present

## 2016-12-24 DIAGNOSIS — Z7901 Long term (current) use of anticoagulants: Secondary | ICD-10-CM | POA: Diagnosis not present

## 2016-12-26 DIAGNOSIS — Z7901 Long term (current) use of anticoagulants: Secondary | ICD-10-CM | POA: Diagnosis not present

## 2016-12-26 DIAGNOSIS — N189 Chronic kidney disease, unspecified: Secondary | ICD-10-CM | POA: Diagnosis not present

## 2016-12-26 DIAGNOSIS — Z9181 History of falling: Secondary | ICD-10-CM | POA: Diagnosis not present

## 2016-12-26 DIAGNOSIS — I5030 Unspecified diastolic (congestive) heart failure: Secondary | ICD-10-CM | POA: Diagnosis not present

## 2016-12-26 DIAGNOSIS — I130 Hypertensive heart and chronic kidney disease with heart failure and stage 1 through stage 4 chronic kidney disease, or unspecified chronic kidney disease: Secondary | ICD-10-CM | POA: Diagnosis not present

## 2016-12-26 DIAGNOSIS — S7291XD Unspecified fracture of right femur, subsequent encounter for closed fracture with routine healing: Secondary | ICD-10-CM | POA: Diagnosis not present

## 2016-12-29 DIAGNOSIS — R8271 Bacteriuria: Secondary | ICD-10-CM | POA: Diagnosis not present

## 2017-01-05 DIAGNOSIS — B372 Candidiasis of skin and nail: Secondary | ICD-10-CM | POA: Diagnosis not present

## 2017-01-27 DIAGNOSIS — S72001D Fracture of unspecified part of neck of right femur, subsequent encounter for closed fracture with routine healing: Secondary | ICD-10-CM | POA: Diagnosis not present

## 2017-02-05 DIAGNOSIS — D509 Iron deficiency anemia, unspecified: Secondary | ICD-10-CM | POA: Diagnosis not present

## 2017-02-25 DIAGNOSIS — N8111 Cystocele, midline: Secondary | ICD-10-CM | POA: Diagnosis not present

## 2017-03-10 DIAGNOSIS — S72001D Fracture of unspecified part of neck of right femur, subsequent encounter for closed fracture with routine healing: Secondary | ICD-10-CM | POA: Diagnosis not present

## 2017-03-13 DIAGNOSIS — L219 Seborrheic dermatitis, unspecified: Secondary | ICD-10-CM | POA: Diagnosis not present

## 2017-03-13 DIAGNOSIS — L299 Pruritus, unspecified: Secondary | ICD-10-CM | POA: Diagnosis not present

## 2017-04-15 DIAGNOSIS — N1330 Unspecified hydronephrosis: Secondary | ICD-10-CM | POA: Diagnosis not present

## 2017-04-15 DIAGNOSIS — R8271 Bacteriuria: Secondary | ICD-10-CM | POA: Diagnosis not present

## 2017-04-15 DIAGNOSIS — R1084 Generalized abdominal pain: Secondary | ICD-10-CM | POA: Diagnosis not present

## 2017-04-16 ENCOUNTER — Other Ambulatory Visit: Payer: Self-pay | Admitting: Urology

## 2017-04-20 ENCOUNTER — Encounter (HOSPITAL_BASED_OUTPATIENT_CLINIC_OR_DEPARTMENT_OTHER): Payer: Self-pay | Admitting: *Deleted

## 2017-04-20 NOTE — Progress Notes (Signed)
NPO AFTER MN.  ARRIVE AT 1030.  NEEDS ISTAT 8.  CURRENT EKG IN CHART W/ EPIC.  WILL TAKE AM MEDS W/ SIPS OF WATER DOS.

## 2017-04-24 ENCOUNTER — Ambulatory Visit (HOSPITAL_BASED_OUTPATIENT_CLINIC_OR_DEPARTMENT_OTHER): Payer: Medicare Other | Admitting: Anesthesiology

## 2017-04-24 ENCOUNTER — Encounter (HOSPITAL_BASED_OUTPATIENT_CLINIC_OR_DEPARTMENT_OTHER)
Admission: RE | Disposition: A | Discharge status: Home / Self Care | Payer: Self-pay | Source: Ambulatory Visit | Attending: Urology

## 2017-04-24 ENCOUNTER — Encounter (HOSPITAL_BASED_OUTPATIENT_CLINIC_OR_DEPARTMENT_OTHER): Payer: Self-pay

## 2017-04-24 ENCOUNTER — Ambulatory Visit (HOSPITAL_BASED_OUTPATIENT_CLINIC_OR_DEPARTMENT_OTHER)
Admission: RE | Admit: 2017-04-24 | Discharge: 2017-04-24 | Disposition: A | Discharge status: Home / Self Care | Payer: Medicare Other | Source: Ambulatory Visit | Attending: Urology | Admitting: Urology

## 2017-04-24 DIAGNOSIS — K219 Gastro-esophageal reflux disease without esophagitis: Secondary | ICD-10-CM | POA: Insufficient documentation

## 2017-04-24 DIAGNOSIS — Z7901 Long term (current) use of anticoagulants: Secondary | ICD-10-CM | POA: Diagnosis not present

## 2017-04-24 DIAGNOSIS — Z8542 Personal history of malignant neoplasm of other parts of uterus: Secondary | ICD-10-CM | POA: Insufficient documentation

## 2017-04-24 DIAGNOSIS — E785 Hyperlipidemia, unspecified: Secondary | ICD-10-CM | POA: Diagnosis not present

## 2017-04-24 DIAGNOSIS — I480 Paroxysmal atrial fibrillation: Secondary | ICD-10-CM | POA: Diagnosis not present

## 2017-04-24 DIAGNOSIS — N111 Chronic obstructive pyelonephritis: Secondary | ICD-10-CM | POA: Diagnosis not present

## 2017-04-24 DIAGNOSIS — I10 Essential (primary) hypertension: Secondary | ICD-10-CM | POA: Insufficient documentation

## 2017-04-24 DIAGNOSIS — Z79899 Other long term (current) drug therapy: Secondary | ICD-10-CM | POA: Diagnosis not present

## 2017-04-24 DIAGNOSIS — N131 Hydronephrosis with ureteral stricture, not elsewhere classified: Secondary | ICD-10-CM | POA: Insufficient documentation

## 2017-04-24 DIAGNOSIS — Z87442 Personal history of urinary calculi: Secondary | ICD-10-CM | POA: Insufficient documentation

## 2017-04-24 DIAGNOSIS — D649 Anemia, unspecified: Secondary | ICD-10-CM | POA: Diagnosis not present

## 2017-04-24 DIAGNOSIS — J9601 Acute respiratory failure with hypoxia: Secondary | ICD-10-CM | POA: Diagnosis not present

## 2017-04-24 HISTORY — DX: Long term (current) use of anticoagulants: Z79.01

## 2017-04-24 HISTORY — PX: CYSTOSCOPY W/ URETERAL STENT PLACEMENT: SHX1429

## 2017-04-24 HISTORY — DX: Iron deficiency anemia, unspecified: D50.9

## 2017-04-24 LAB — POCT I-STAT 4, (NA,K, GLUC, HGB,HCT)
Glucose, Bld: 96 mg/dL (ref 65–99)
HCT: 46 % (ref 36.0–46.0)
Hemoglobin: 15.6 g/dL — ABNORMAL HIGH (ref 12.0–15.0)
Potassium: 4.3 mmol/L (ref 3.5–5.1)
Sodium: 141 mmol/L (ref 135–145)

## 2017-04-24 SURGERY — CYSTOSCOPY, WITH RETROGRADE PYELOGRAM AND URETERAL STENT INSERTION
Anesthesia: General | Site: Bladder | Laterality: Right

## 2017-04-24 MED ORDER — FENTANYL CITRATE (PF) 100 MCG/2ML IJ SOLN
INTRAMUSCULAR | Status: AC
  Filled 2017-04-24: qty 2

## 2017-04-24 MED ORDER — STERILE WATER FOR IRRIGATION IR SOLN
Status: DC | PRN
  Administered 2017-04-24: 1000 mL

## 2017-04-24 MED ORDER — LIDOCAINE 2% (20 MG/ML) 5 ML SYRINGE
INTRAMUSCULAR | Status: DC | PRN
  Administered 2017-04-24: 60 mg via INTRAVENOUS

## 2017-04-24 MED ORDER — IOHEXOL 300 MG/ML  SOLN
INTRAMUSCULAR | Status: DC | PRN
  Administered 2017-04-24: 9 mL via INTRAVENOUS

## 2017-04-24 MED ORDER — CEFAZOLIN SODIUM-DEXTROSE 2-4 GM/100ML-% IV SOLN
2.0000 g | INTRAVENOUS | Status: AC
  Administered 2017-04-24: 2 g via INTRAVENOUS
  Filled 2017-04-24: qty 100

## 2017-04-24 MED ORDER — FENTANYL CITRATE (PF) 100 MCG/2ML IJ SOLN
INTRAMUSCULAR | Status: DC | PRN
  Administered 2017-04-24: 50 ug via INTRAVENOUS

## 2017-04-24 MED ORDER — ONDANSETRON HCL 4 MG/2ML IJ SOLN
4.0000 mg | Freq: Once | INTRAMUSCULAR | Status: DC | PRN
  Filled 2017-04-24: qty 2

## 2017-04-24 MED ORDER — PROPOFOL 10 MG/ML IV BOLUS
INTRAVENOUS | Status: DC | PRN
  Administered 2017-04-24: 150 mg via INTRAVENOUS

## 2017-04-24 MED ORDER — MEPERIDINE HCL 25 MG/ML IJ SOLN
6.2500 mg | INTRAMUSCULAR | Status: DC | PRN
  Filled 2017-04-24: qty 1

## 2017-04-24 MED ORDER — PROPOFOL 10 MG/ML IV BOLUS
INTRAVENOUS | Status: AC
  Filled 2017-04-24: qty 20

## 2017-04-24 MED ORDER — CEFAZOLIN SODIUM-DEXTROSE 2-4 GM/100ML-% IV SOLN
INTRAVENOUS | Status: AC
  Filled 2017-04-24: qty 100

## 2017-04-24 MED ORDER — ONDANSETRON HCL 4 MG/2ML IJ SOLN
INTRAMUSCULAR | Status: DC | PRN
  Administered 2017-04-24: 4 mg via INTRAVENOUS

## 2017-04-24 MED ORDER — FENTANYL CITRATE (PF) 100 MCG/2ML IJ SOLN
25.0000 ug | INTRAMUSCULAR | Status: DC | PRN
Start: 2017-04-24 — End: 2017-04-24
  Filled 2017-04-24: qty 1

## 2017-04-24 MED ORDER — LIDOCAINE 2% (20 MG/ML) 5 ML SYRINGE
INTRAMUSCULAR | Status: AC
  Filled 2017-04-24: qty 5

## 2017-04-24 MED ORDER — DEXAMETHASONE SODIUM PHOSPHATE 4 MG/ML IJ SOLN
INTRAMUSCULAR | Status: DC | PRN
  Administered 2017-04-24: 10 mg via INTRAVENOUS

## 2017-04-24 MED ORDER — LACTATED RINGERS IV SOLN
INTRAVENOUS | Status: DC
  Administered 2017-04-24: 11:00:00 via INTRAVENOUS
  Filled 2017-04-24: qty 1000

## 2017-04-24 MED ORDER — DEXAMETHASONE SODIUM PHOSPHATE 10 MG/ML IJ SOLN
INTRAMUSCULAR | Status: AC
  Filled 2017-04-24: qty 1

## 2017-04-24 MED ORDER — ONDANSETRON HCL 4 MG/2ML IJ SOLN
INTRAMUSCULAR | Status: AC
  Filled 2017-04-24: qty 2

## 2017-04-24 SURGICAL SUPPLY — 21 items
BAG DRAIN URO-CYSTO SKYTR STRL (DRAIN) ×2 IMPLANT
CATH INTERMIT  6FR 70CM (CATHETERS) ×2 IMPLANT
CLOTH BEACON ORANGE TIMEOUT ST (SAFETY) ×2 IMPLANT
GLOVE BIO SURGEON STRL SZ7.5 (GLOVE) ×2 IMPLANT
GOWN STRL REUS W/ TWL LRG LVL3 (GOWN DISPOSABLE) IMPLANT
GOWN STRL REUS W/TWL LRG LVL3 (GOWN DISPOSABLE)
GOWN STRL REUS W/TWL XL LVL3 (GOWN DISPOSABLE) ×4 IMPLANT
GUIDEWIRE ANG ZIPWIRE 038X150 (WIRE) ×2 IMPLANT
GUIDEWIRE STR DUAL SENSOR (WIRE) ×2 IMPLANT
IV NS 1000ML (IV SOLUTION)
IV NS 1000ML BAXH (IV SOLUTION) IMPLANT
IV NS IRRIG 3000ML ARTHROMATIC (IV SOLUTION) IMPLANT
KIT RM TURNOVER CYSTO AR (KITS) ×2 IMPLANT
MANIFOLD NEPTUNE II (INSTRUMENTS) ×2 IMPLANT
NS IRRIG 500ML POUR BTL (IV SOLUTION) ×2 IMPLANT
PACK CYSTO (CUSTOM PROCEDURE TRAY) ×2 IMPLANT
STENT POLARIS LOOP 7FR X 24 CM (STENTS) ×2 IMPLANT
SYRINGE 10CC LL (SYRINGE) ×2 IMPLANT
TUBE CONNECTING 12X1/4 (SUCTIONS) ×2 IMPLANT
TUBE FEEDING 8FR 16IN STR KANG (MISCELLANEOUS) ×2 IMPLANT
WATER STERILE IRR 3000ML UROMA (IV SOLUTION) ×2 IMPLANT

## 2017-04-24 NOTE — Anesthesia Postprocedure Evaluation (Signed)
Anesthesia Post Note  Patient: [name] [name]  Procedure(s) Performed: Procedure(s) (LRB): CYSTOSCOPY WITH RETROGRADE PYELOGRAM/URETERAL STENT REPLACEMENT (Right)     Patient location during evaluation: PACU Anesthesia Type: General Level of consciousness: awake Pain management: pain level controlled Vital Signs Assessment: post-procedure vital signs reviewed and stable Respiratory status: spontaneous breathing Cardiovascular status: stable Postop Assessment: no signs of nausea or vomiting Anesthetic complications: no    Last Vitals:  Vitals:   04/24/17 1245 04/24/17 1335  BP: 123/83 124/65  Pulse: 88 85  Resp: 18 18  Temp:  36.7 C    Last Pain:  Vitals:   04/24/17 1025  TempSrc: Oral   Pain Goal: Patients Stated Pain Goal: 8 (04/24/17 1052)               Huey

## 2017-04-24 NOTE — Op Note (Signed)
NAME:  [name] [name], CORPORAN                  ACCOUNT NO.:  MEDICAL RECORD NO.:  [id number]  LOCATION:                                 FACILITY:  PHYSICIAN:  Alexis Frock, MD          DATE OF BIRTH:  DATE OF PROCEDURE:04/24/2017                               OPERATIVE REPORT   PREOPERATIVE DIAGNOSIS:  Chronic right multifocal ureteral stricture.  PROCEDURE: 1. Cystoscopy with right retrograde pyelogram interpretation. 2. Exchange of right ureteral stent, 7 x 24 Polaris, no tether.  ESTIMATED BLOOD LOSS:  Nil.  COMPLICATION:  None.  SPECIMEN:  None.  FINDINGS: 1. Moderately across the in situ ureteral stent. 2. Continued multifocal right ureteral stricture consistent with known     retroperitoneal fibrosis. 3. Successful replacement of right ureteral stent, proximal in renal     pelvis and distal in the bladder.  INDICATION:  [name] is a pleasant, but unfortunate 76 year old lady, who has multiple medical comorbidities and also chronic right multifocal ureteral stricture presumably due to retroperitoneal fibrosis.  She has been managed with chronic stenting as she is not a candidate for reconstruction.  It has been approximately 8 months since her most recent stent change, usually it is on a 52-month cycle.  She has had some mild flank pain consistent with likely early obstruction and informed consent was obtained for stent exchange.  Informed consent was obtained and placed in the medical record.  PROCEDURE IN DETAIL:  The patient being, [facility], was verified. Procedure being, right ureteral stent change, was confirmed.  Procedure was carried out.  Time-out was performed.  Intravenous antibiotics were administered.  General LMA anesthesia was introduced.  The patient was placed into a low lithotomy position.  Sterile field was created by prepping and draping the patient's vagina, introitus, and proximal thighs using iodine, and Cystourethroscopy was performed  using a 22- French rigid cystoscope with offset lens.  Inspection of bladder revealed no diverticula, calcifications, or papular lesions.  The distal right ureteral stent was seen in situ.  It was moderately encrusted and was grasped brought out in its entirety, set aside for discard.  The ureteral orifice was then cannulated with a 6-French end-hole catheter and right retrograde pyelogram was obtained.  Right retrograde pyelogram demonstrated a single right ureter with single-system right kidney.  There was mild hydronephrosis and multifocal ureteral narrowing mostly in the midportion of the ureter consistent with likely retroperitoneal fibrosis.  A 0.038 ZIPwire was advanced to lower pole, and a new 7 x 24 Polaris-type stent was placed using cystoscopic and fluoroscopic guidance.  Good proximal and distal deployment were noted.  Bladder was emptied per cystoscope. Procedure was then terminated.  The patient tolerated procedure well. There were no immediate periprocedural complications.  The patient was taken to the postanesthesia care unit in stable condition.          ______________________________ Alexis Frock, MD     TM/MEDQ  D:  04/24/2017  T:  04/24/2017  Job:  [id number]

## 2017-04-24 NOTE — Anesthesia Preprocedure Evaluation (Addendum)
Anesthesia Evaluation  Patient identified by MRN, date of birth, ID band Patient awake    Reviewed: Allergy & Precautions, NPO status , Patient's Chart, lab work & pertinent test results  History of Anesthesia Complications (+) DIFFICULT AIRWAY  Airway Mallampati: II  TM Distance: >3 FB Neck ROM: Full    Dental no notable dental hx.    Pulmonary neg pulmonary ROS, shortness of breath,    Pulmonary exam normal breath sounds clear to auscultation       Cardiovascular hypertension, negative cardio ROS Normal cardiovascular exam+ dysrhythmias Atrial Fibrillation  Rhythm:Regular Rate:Normal     Neuro/Psych negative neurological ROS  negative psych ROS   GI/Hepatic negative GI ROS, Neg liver ROS,   Endo/Other  negative endocrine ROS  Renal/GU negative Renal ROS  negative genitourinary   Musculoskeletal negative musculoskeletal ROS (+)   Abdominal   Peds negative pediatric ROS (+)  Hematology negative hematology ROS (+)   Anesthesia Other Findings Left ventricle: The cavity size was normal. Wall thickness was normal. Systolic function was mildly reduced. The estimated ejection fraction was in the range of 45% to 50%. Wall motion was normal; there were no regional wall motion abnormalities. The study was not technically sufficient to allow evaluation of LV diastolic dysfunction due to atrial fibrillation.  Reproductive/Obstetrics negative OB ROS                                                           Anesthesia Evaluation  Patient identified by MRN, date of birth, ID band Patient awake    Reviewed: Allergy & Precautions, NPO status , Patient's Chart, lab work & pertinent test results  History of Anesthesia Complications (+) DIFFICULT AIRWAY  Airway Mallampati: II  TM Distance: <3 FB Neck ROM: Limited   Comment: Previous difficult intubation in [place] [facility] admit. Pt easily  intubated w/ video glidescope 05-07-16 admission Dental  (+) Teeth Intact, Dental Advisory Given   Pulmonary shortness of breath,    breath sounds clear to auscultation       Cardiovascular Exercise Tolerance: Poor hypertension, Pt. on medications and Pt. on home beta blockers + Past MI  + dysrhythmias Atrial Fibrillation  Rhythm:Regular Rate:Normal     Neuro/Psych    GI/Hepatic Neg liver ROS, GERD  ,  Endo/Other    Renal/GU Renal disease     Musculoskeletal   Abdominal   Peds  Hematology  (+) anemia ,   Anesthesia Other Findings   Reproductive/Obstetrics                         Anesthesia Physical Anesthesia Plan  ASA: III  Anesthesia Plan: General   Post-op Pain Management:    Induction: Intravenous  Airway Management Planned: LMA  Additional Equipment:   Intra-op Plan:   Post-operative Plan: Extubation in OR  Informed Consent: I have reviewed the patients History and Physical, chart, labs and discussed the procedure including the risks, benefits and alternatives for the proposed anesthesia with the patient or authorized representative who has indicated his/her understanding and acceptance.   Dental advisory given  Plan Discussed with: CRNA and Anesthesiologist  Anesthesia Plan Comments:         Anesthesia Quick Evaluation  Anesthesia Evaluation  Patient identified by MRN, date of birth, ID band Patient awake    Reviewed: Allergy & Precautions, NPO status , Patient's Chart, lab work & pertinent test results  History of Anesthesia Complications (+) DIFFICULT AIRWAY  Airway Mallampati: II  TM Distance: <3 FB Neck ROM: Limited   Comment: Previous difficult intubation in [place] [facility] admit. Pt easily intubated w/ video glidescope 05-07-16 admission Dental  (+) Teeth Intact, Dental Advisory Given   Pulmonary shortness of breath,    breath sounds clear to  auscultation       Cardiovascular Exercise Tolerance: Poor hypertension, Pt. on medications and Pt. on home beta blockers + Past MI  + dysrhythmias Atrial Fibrillation  Rhythm:Regular Rate:Normal     Neuro/Psych    GI/Hepatic Neg liver ROS, GERD  ,  Endo/Other    Renal/GU Renal disease     Musculoskeletal   Abdominal   Peds  Hematology  (+) anemia ,   Anesthesia Other Findings   Reproductive/Obstetrics                         Anesthesia Physical Anesthesia Plan  ASA: III  Anesthesia Plan: General   Post-op Pain Management:    Induction: Intravenous  Airway Management Planned: LMA  Additional Equipment:   Intra-op Plan:   Post-operative Plan: Extubation in OR  Informed Consent: I have reviewed the patients History and Physical, chart, labs and discussed the procedure including the risks, benefits and alternatives for the proposed anesthesia with the patient or authorized representative who has indicated his/her understanding and acceptance.   Dental advisory given  Plan Discussed with: CRNA and Anesthesiologist  Anesthesia Plan Comments:         Anesthesia Quick Evaluation                                   Anesthesia Evaluation  Patient identified by MRN, date of birth, ID band Patient awake    Reviewed: Allergy & Precautions, NPO status , Patient's Chart, lab work & pertinent test results  History of Anesthesia Complications (+) DIFFICULT AIRWAY  Airway Mallampati: II  TM Distance: <3 FB Neck ROM: Limited   Comment: Previous difficult intubation in [place] [facility] admit. Pt easily intubated w/ video glidescope 05-07-16 admission Dental  (+) Teeth Intact, Dental Advisory Given   Pulmonary shortness of breath,    breath sounds clear to auscultation       Cardiovascular Exercise Tolerance: Poor hypertension, Pt. on medications and Pt. on home beta blockers + Past MI  + dysrhythmias Atrial  Fibrillation  Rhythm:Regular Rate:Normal     Neuro/Psych    GI/Hepatic Neg liver ROS, GERD  ,  Endo/Other    Renal/GU Renal disease     Musculoskeletal   Abdominal   Peds  Hematology  (+) anemia ,   Anesthesia Other Findings   Reproductive/Obstetrics                         Anesthesia Physical Anesthesia Plan  ASA:                                   Anesthesia Evaluation  Patient identified by MRN, date of birth, ID band Patient awake    Reviewed: Allergy & Precautions, NPO status , Patient's Chart, lab  work & pertinent test results  History of Anesthesia Complications (+) DIFFICULT AIRWAY  Airway Mallampati: II  TM Distance: <3 FB Neck ROM: Limited   Comment: Previous difficult intubation in [place] [facility] admit. Pt easily intubated w/ video glidescope 05-07-16 admission Dental  (+) Teeth Intact, Dental Advisory Given   Pulmonary shortness of breath,    breath sounds clear to auscultation       Cardiovascular Exercise Tolerance: Poor hypertension, Pt. on medications and Pt. on home beta blockers + Past MI  + dysrhythmias Atrial Fibrillation  Rhythm:Regular Rate:Normal     Neuro/Psych    GI/Hepatic Neg liver ROS, GERD  ,  Endo/Other    Renal/GU Renal disease     Musculoskeletal   Abdominal   Peds  Hematology  (+) anemia ,   Anesthesia Other Findings   Reproductive/Obstetrics                         Anesthesia Physical Anesthesia Plan  ASA: III  Anesthesia Plan: General   Post-op Pain Management:    Induction: Intravenous  Airway Management Planned: LMA  Additional Equipment:   Intra-op Plan:   Post-operative Plan: Extubation in OR  Informed Consent: I have reviewed the patients History and Physical, chart, labs and discussed the procedure including the risks, benefits and alternatives for the proposed anesthesia with the patient or authorized representative who has  indicated his/her understanding and acceptance.   Dental advisory given  Plan Discussed with: CRNA and Anesthesiologist  Anesthesia Plan Comments:         Anesthesia Quick Evaluation III  Anesthesia Plan: General   Post-op Pain Management:    Induction: Intravenous  Airway Management Planned: LMA  Additional Equipment:   Intra-op Plan:   Post-operative Plan: Extubation in OR  Informed Consent: I have reviewed the patients History and Physical, chart, labs and discussed the procedure including the risks, benefits and alternatives for the proposed anesthesia with the patient or authorized representative who has indicated his/her understanding and acceptance.   Dental advisory given  Plan Discussed with: CRNA and Anesthesiologist  Anesthesia Plan Comments:         Anesthesia Quick Evaluation                                   Anesthesia Evaluation  Patient identified by MRN, date of birth, ID band Patient awake    Reviewed: Allergy & Precautions, NPO status , Patient's Chart, lab work & pertinent test results  History of Anesthesia Complications (+) DIFFICULT AIRWAY  Airway Mallampati: II  TM Distance: <3 FB Neck ROM: Limited   Comment: Previous difficult intubation in [place] [facility] admit. Pt easily intubated w/ video glidescope 05-07-16 admission Dental  (+) Teeth Intact, Dental Advisory Given   Pulmonary shortness of breath,    breath sounds clear to auscultation       Cardiovascular Exercise Tolerance: Poor hypertension, Pt. on medications and Pt. on home beta blockers + Past MI  + dysrhythmias Atrial Fibrillation  Rhythm:Regular Rate:Normal     Neuro/Psych    GI/Hepatic Neg liver ROS, GERD  ,  Endo/Other    Renal/GU Renal disease     Musculoskeletal   Abdominal   Peds  Hematology  (+) anemia ,   Anesthesia Other Findings   Reproductive/Obstetrics  Anesthesia  Physical Anesthesia Plan  ASA: III  Anesthesia Plan: General   Post-op Pain Management:    Induction: Intravenous  Airway Management Planned: LMA  Additional Equipment:   Intra-op Plan:   Post-operative Plan: Extubation in OR  Informed Consent: I have reviewed the patients History and Physical, chart, labs and discussed the procedure including the risks, benefits and alternatives for the proposed anesthesia with the patient or authorized representative who has indicated his/her understanding and acceptance.   Dental advisory given  Plan Discussed with: CRNA and Anesthesiologist  Anesthesia Plan Comments:         Anesthesia Quick Evaluation                                   Anesthesia Evaluation  Patient identified by MRN, date of birth, ID band Patient awake    Reviewed: Allergy & Precautions, NPO status , Patient's Chart, lab work & pertinent test results  History of Anesthesia Complications (+) DIFFICULT AIRWAY  Airway Mallampati: II  TM Distance: <3 FB Neck ROM: Limited   Comment: Previous difficult intubation in [place] [facility] admit. Pt easily intubated w/ video glidescope 05-07-16 admission Dental  (+) Teeth Intact, Dental Advisory Given   Pulmonary shortness of breath,    breath sounds clear to auscultation       Cardiovascular Exercise Tolerance: Poor hypertension, Pt. on medications and Pt. on home beta blockers + Past MI  + dysrhythmias Atrial Fibrillation  Rhythm:Regular Rate:Normal     Neuro/Psych    GI/Hepatic Neg liver ROS, GERD  ,  Endo/Other    Renal/GU Renal disease     Musculoskeletal   Abdominal   Peds  Hematology  (+) anemia ,   Anesthesia Other Findings   Reproductive/Obstetrics                         Anesthesia Physical Anesthesia Plan  ASA: III  Anesthesia Plan: General   Post-op Pain Management:    Induction: Intravenous  Airway Management Planned: LMA  Additional  Equipment:   Intra-op Plan:   Post-operative Plan: Extubation in OR  Informed Consent: I have reviewed the patients History and Physical, chart, labs and discussed the procedure including the risks, benefits and alternatives for the proposed anesthesia with the patient or authorized representative who has indicated his/her understanding and acceptance.   Dental advisory given  Plan Discussed with: CRNA and Anesthesiologist  Anesthesia Plan Comments:         Anesthesia Quick Evaluation  Anesthesia Physical Anesthesia Plan  ASA: III  Anesthesia Plan: General   Post-op Pain Management:    Induction: Intravenous  PONV Risk Score and Plan:   Airway Management Planned: LMA  Additional Equipment:   Intra-op Plan:   Post-operative Plan:   Informed Consent: I have reviewed the patients History and Physical, chart, labs and discussed the procedure including the risks, benefits and alternatives for the proposed anesthesia with the patient or authorized representative who has indicated his/her understanding and acceptance.   Dental advisory given  Plan Discussed with: CRNA and Surgeon  Anesthesia Plan Comments: ( )       Anesthesia Quick Evaluation                                   Anesthesia Evaluation  Patient identified by MRN, date  of birth, ID band Patient awake    Reviewed: Allergy & Precautions, NPO status , Patient's Chart, lab work & pertinent test results  History of Anesthesia Complications (+) DIFFICULT AIRWAY  Airway Mallampati: II  TM Distance: <3 FB Neck ROM: Limited   Comment: Previous difficult intubation in [place] [facility] admit. Pt easily intubated w/ video glidescope 05-07-16 admission Dental  (+) Teeth Intact, Dental Advisory Given   Pulmonary shortness of breath,    breath sounds clear to auscultation       Cardiovascular Exercise Tolerance: Poor hypertension, Pt. on medications and Pt. on home beta blockers + Past MI  +  dysrhythmias Atrial Fibrillation  Rhythm:Regular Rate:Normal     Neuro/Psych    GI/Hepatic Neg liver ROS, GERD  ,  Endo/Other    Renal/GU Renal disease     Musculoskeletal   Abdominal   Peds  Hematology  (+) anemia ,   Anesthesia Other Findings   Reproductive/Obstetrics                         Anesthesia Physical Anesthesia Plan  ASA: III  Anesthesia Plan: General   Post-op Pain Management:    Induction: Intravenous  Airway Management Planned: LMA  Additional Equipment:   Intra-op Plan:   Post-operative Plan: Extubation in OR  Informed Consent: I have reviewed the patients History and Physical, chart, labs and discussed the procedure including the risks, benefits and alternatives for the proposed anesthesia with the patient or authorized representative who has indicated his/her understanding and acceptance.   Dental advisory given  Plan Discussed with: CRNA and Anesthesiologist  Anesthesia Plan Comments:         Anesthesia Quick Evaluation                                   Anesthesia Evaluation  Patient identified by MRN, date of birth, ID band Patient awake    Reviewed: Allergy & Precautions, NPO status , Patient's Chart, lab work & pertinent test results  History of Anesthesia Complications (+) DIFFICULT AIRWAY  Airway Mallampati: II  TM Distance: <3 FB Neck ROM: Limited   Comment: Previous difficult intubation in [place] [facility] admit. Pt easily intubated w/ video glidescope 05-07-16 admission Dental  (+) Teeth Intact, Dental Advisory Given   Pulmonary shortness of breath,    breath sounds clear to auscultation       Cardiovascular Exercise Tolerance: Poor hypertension, Pt. on medications and Pt. on home beta blockers + Past MI  + dysrhythmias Atrial Fibrillation  Rhythm:Regular Rate:Normal     Neuro/Psych    GI/Hepatic Neg liver ROS, GERD  ,  Endo/Other    Renal/GU Renal disease      Musculoskeletal   Abdominal   Peds  Hematology  (+) anemia ,   Anesthesia Other Findings   Reproductive/Obstetrics                         Anesthesia Physical Anesthesia Plan  ASA: III  Anesthesia Plan: General   Post-op Pain Management:    Induction: Intravenous  Airway Management Planned: LMA  Additional Equipment:   Intra-op Plan:   Post-operative Plan: Extubation in OR  Informed Consent: I have reviewed the patients History and Physical, chart, labs and discussed the procedure including the risks, benefits and alternatives for the proposed anesthesia with the patient or authorized representative who has  indicated his/her understanding and acceptance.   Dental advisory given  Plan Discussed with: CRNA and Anesthesiologist  Anesthesia Plan Comments:         Anesthesia Quick Evaluation

## 2017-04-24 NOTE — Interval H&P Note (Signed)
History and Physical Interval Note:  04/24/2017 11:37 AM  [name] [name]  has presented today for surgery, with the diagnosis of RIGHT URETERAL STRICTURE  The various methods of treatment have been discussed with the patient and family. After consideration of risks, benefits and other options for treatment, the patient has consented to  Procedure(s): CYSTOSCOPY WITH RETROGRADE PYELOGRAM/URETERAL STENT REPLACEMENT (Right) as a surgical intervention .  The patient's history has been reviewed, patient examined, no change in status, stable for surgery.  I have reviewed the patient's chart and labs.  Questions were answered to the patient's satisfaction.     Adna Nofziger

## 2017-04-24 NOTE — Discharge Instructions (Signed)
1 - You may have urinary urgency (bladder spasms) and bloody urine on / off with stent in place. This is normal. ° °2 - Call MD or go to ER for fever >102, severe pain / nausea / vomiting not relieved by medications, or acute change in medical status ° °[facility] °[phone] °Post Ureteroscopy With or Without Stent Instructions ° °Definitions: ° °Ureter: The duct that transports urine from the kidney to the bladder. °Stent:   A plastic hollow tube that is placed into the ureter, from the kidney to the  bladder to prevent the ureter from swelling shut. ° °GENERAL INSTRUCTIONS: ° °Despite the fact that no skin incisions were used, the area around the ureter and bladder is raw and irritated. The stent is a foreign body which will further irritate the bladder wall. This irritation is manifested by increased frequency of urination, both day and night, and by an increase in the urge to urinate. In some, the urge to urinate is present almost always. Sometimes the urge is strong enough that you may not be able to stop yourself from urinating. The only real cure is to remove the stent and then give time for the bladder wall to heal which can't be done until the danger of the ureter swelling shut has passed, which varies. ° °You may see some blood in your urine while the stent is in place and a few days afterwards. Do not be alarmed, even if the urine was clear for a while. Get off your feet and drink lots of fluids until clearing occurs. If you start to pass clots or don't improve, call us. ° °DIET: °You may return to your normal diet immediately. Because of the raw surface of your bladder, alcohol, spicy foods, acid type foods and drinks with caffeine may cause irritation or frequency and should be used in moderation. To keep your urine flowing freely and to avoid constipation, drink plenty of fluids during the day ( 8-10 glasses ). °Tip: Avoid cranberry juice because it is very  acidic. ° °ACTIVITY: °Your physical activity doesn't need to be restricted. However, if you are very active, you may see some blood in your urine. We suggest that you reduce your activity under these circumstances until the bleeding has stopped. ° °BOWELS: °It is important to keep your bowels regular during the postoperative period. Straining with bowel movements can cause bleeding. A bowel movement every other day is reasonable. Use a mild laxative if needed, such as Milk of Magnesia 2-3 tablespoons, or 2 Dulcolax tablets. Call if you continue to have problems. If you have been taking narcotics for pain, before, during or after your surgery, you may be constipated. Take a laxative if necessary. ° ° °MEDICATION: °You should resume your pre-surgery medications unless told not to. In addition you will often be given an antibiotic to prevent infection. These should be taken as prescribed until the bottles are finished unless you are having an unusual reaction to one of the drugs. ° °PROBLEMS YOU SHOULD REPORT TO US: °· Fevers over 100.5 Fahrenheit. °· Heavy bleeding, or clots ( See above notes about blood in urine ). °· Inability to urinate. °· Drug reactions ( hives, rash, nausea, vomiting, diarrhea ). °· Severe burning or pain with urination that is not improving. ° °FOLLOW-UP: °You will need a follow-up appointment to monitor your progress. Call for this appointment at the number listed above. Usually the first appointment will be about three to fourteen days after your surgery. ° ° ° ° ° °  Post Anesthesia Home Care Instructions ° °Activity: °Get plenty of rest for the remainder of the day. A responsible individual must stay with you for 24 hours following the procedure.  °For the next 24 hours, DO NOT: °-Drive a car °-Operate machinery °-Drink alcoholic beverages °-Take any medication unless instructed by your physician °-Make any legal decisions or sign important papers. ° °Meals: °Start with liquid foods such as  gelatin or soup. Progress to regular foods as tolerated. Avoid greasy, spicy, heavy foods. If nausea and/or vomiting occur, drink only clear liquids until the nausea and/or vomiting subsides. Call your physician if vomiting continues. ° °Special Instructions/Symptoms: °Your throat may feel dry or sore from the anesthesia or the breathing tube placed in your throat during surgery. If this causes discomfort, gargle with warm salt water. The discomfort should disappear within 24 hours. ° °If you had a scopolamine patch placed behind your ear for the management of post- operative nausea and/or vomiting: ° °1. The medication in the patch is effective for 72 hours, after which it should be removed.  Wrap patch in a tissue and discard in the trash. Wash hands thoroughly with soap and water. °2. You may remove the patch earlier than 72 hours if you experience unpleasant side effects which may include dry mouth, dizziness or visual disturbances. °3. Avoid touching the patch. Wash your hands with soap and water after contact with the patch. °  ° ° °

## 2017-04-24 NOTE — Brief Op Note (Signed)
04/24/2017  12:12 PM  PATIENT:  [name] [name]  76 y.o. female  PRE-OPERATIVE DIAGNOSIS:  RIGHT URETERAL STRICTURE  POST-OPERATIVE DIAGNOSIS:  * No post-op diagnosis entered *  PROCEDURE:  Procedure(s): CYSTOSCOPY WITH RETROGRADE PYELOGRAM/URETERAL STENT REPLACEMENT (Right)  SURGEON:  Surgeon(s) and Role:    * Alexis Frock, MD - Primary  PHYSICIAN ASSISTANT:   ASSISTANTS: none   ANESTHESIA:   general  EBL:  Total I/O In: 200 [I.V.:200] Out: 0   BLOOD ADMINISTERED:none  DRAINS: none   LOCAL MEDICATIONS USED:  NONE  SPECIMEN:  No Specimen  DISPOSITION OF SPECIMEN:  N/A  COUNTS:  YES  TOURNIQUET:  * No tourniquets in log *  DICTATION: .Other Dictation: Dictation Number [id number]  PLAN OF CARE: Discharge to home after PACU  PATIENT DISPOSITION:  PACU - hemodynamically stable.   Delay start of Pharmacological VTE agent (>24hrs) due to surgical blood loss or risk of bleeding: yes

## 2017-04-24 NOTE — Anesthesia Procedure Notes (Signed)
Procedure Name: LMA Insertion Date/Time: 04/24/2017 11:59 AM Performed by: Denna Haggard D Pre-anesthesia Checklist: Patient identified, Emergency Drugs available, Suction available and Patient being monitored Patient Re-evaluated:Patient Re-evaluated prior to inductionOxygen Delivery Method: Circle system utilized Preoxygenation: Pre-oxygenation with 100% oxygen Intubation Type: IV induction Ventilation: Mask ventilation without difficulty LMA: LMA inserted LMA Size: 4.0 Number of attempts: 1 Airway Equipment and Method: Bite block Placement Confirmation: positive ETCO2 Tube secured with: Tape Dental Injury: Teeth and Oropharynx as per pre-operative assessment

## 2017-04-24 NOTE — Transfer of Care (Signed)
Immediate Anesthesia Transfer of Care Note  Patient: [name] [name]  Procedure(s) Performed: Procedure(s) (LRB): CYSTOSCOPY WITH RETROGRADE PYELOGRAM/URETERAL STENT REPLACEMENT (Right)  Patient Location: PACU  Anesthesia Type: General  Level of Consciousness: awake, oriented, sedated and patient cooperative  Airway & Oxygen Therapy: Patient Spontanous Breathing and Patient connected to face mask oxygen  Post-op Assessment: Report given to PACU RN and Post -op Vital signs reviewed and stable  Post vital signs: Reviewed and stable  Complications: No apparent anesthesia complications  Last Vitals:  Vitals:   04/24/17 1025 04/24/17 1224  BP: (!) 136/91 140/85  Pulse: 88 (!) 102  Resp: 16 14  Temp: 36.4 C     Last Pain:  Vitals:   04/24/17 1025  TempSrc: Oral      Patients Stated Pain Goal: 8 (04/24/17 1052)

## 2017-04-24 NOTE — H&P (Signed)
[name] [name] is an 76 y.o. female.    Chief Complaint: Pre-op RIGHT ureteral stent exchange  HPI:   1 - Rt Chronic Hydronephrosis / Partial Ureteral Sricture- Multiple prior stone procedures on right including SWL, ureteroscopy, and neph tube. Concern for high grade distal stricure 05/2013 during episode of urosepsis treated with neph tube by antegrade nephrostogram. Formal diagnostic ureteroscopy 02/2014 with several areas of relative narrowing (distal and proximal) but NO frank stricture (accomodated 39F sheath to UPJ).    Recent Imaging   03/2014 Renal US - mod rt hydro, max pox ureteral diameter 1.8cm.   12/2014 Renal US - mod rt hydro, max renal pelvis diameter 1.8cm, max prox ureter 0.75cm   04/2016 Contrast CT - mod Rt hydro to just below iliacs, stable Lt non-complex cysts --> 7x24 JJ stent placed. 1 artery/ 1 vein Rt renovascular anatomy.   05/2016 - Renogram Rt 42% / Lt 58% relative function   10/2016 - Exchange Rt stent 7x24 polaris;    PMH sig for AFib / Eliquus (primary prevention only) , lap chole, hyst, left open ureterolithotomy, ureteroscopy x several, shockwave lithotripsy.    Today [name] [name] is seen to proceed with RIGHT ureteral stent exchange. No recent fevers.     Past Medical History:  Diagnosis Date  . Anticoagulant long-term use    ELIQUIS  . Dyspnea    w exertion d/t Afib per pt  . Family history of adverse reaction to anesthesia   . GERD (gastroesophageal reflux disease)   . Hematuria    intermittently d/t JJ stent  . History of kidney stones   . History of septic shock    2011 and 06-04-2013  w/ acute renal failure  . History of uterine cancer    STAGE I  --  S/P TAH W/ BSO  (NO OTHER TX)  . Hyperlipidemia   . Hypertension   . Iron deficiency anemia    had appt w/ dr Bobby Rumpf (hematologist) 01/ 2018  dx iron def. anemia  s/p  IV Iron infusion and taking oral iron supplement  . PAF (paroxysmal atrial fibrillation) [facility]) DX JUNE 2012    CARDIOLOGIST--  DR KRASOWSKI ([facility] [place])  CHADS2  . Ureteral stricture, right    has JJ stent  . Wears glasses     Past Surgical History:  Procedure Laterality Date  . CHOLECYSTECTOMY  2010  . CYSTO/  BALLOON DILATION RIGHT URETERAL STRICTURE/ STENT PLACEMENT  06-02-2013  . CYSTO/  BILATERAL RETROGRADE PYELOGRAM/ RIGHT URETEROSCOPY AND STENT PLACEMENT  10-04-2005  . CYSTO/ LEFT URETEROSCOPIC STONE EXTRACTION  04-03-2006  . CYSTOSCOPY W/ URETERAL STENT PLACEMENT Right 02/06/2014   Procedure: CYSTOSCOPY WITH RIGHT RETROGRADE PYELOGRAM RIGHT STENT REMOVAL and REPLACEMENT, Right Ureteroscopy;  Surgeon: Ailene Rud, MD;  Location: [facility];  Service: Urology;  Laterality: Right;  . CYSTOSCOPY W/ URETERAL STENT PLACEMENT Right 11/26/2016   Procedure: CYSTOSCOPY WITH RETROGRADE PYELOGRAM/URETERAL STENT REPLACEMENT;  Surgeon: Alexis Frock, MD;  Location: [facility];  Service: Urology;  Laterality: Right;  . CYSTOSCOPY WITH LITHOLAPAXY N/A 11/21/2013   Procedure: CYSTOSCOPY WITH LITHOLAPAXY;  Surgeon: Ailene Rud, MD;  Location: [facility];  Service: Urology;  Laterality: N/A;  . CYSTOSCOPY WITH RETROGRADE PYELOGRAM, URETEROSCOPY AND STENT PLACEMENT Bilateral 03/15/2014   Procedure: CYSTOSCOPY WITH BILATERAL RETROGRADE PYELOGRAM, RIGHT DIAGNOSTIC URETEROSCOPY AND STENT EXCHANGE;  Surgeon: Alexis Frock, MD;  Location: [facility];  Service: Urology;  Laterality: Bilateral;  . [place]  Right 05/07/2016   Procedure: CYSTOSCOPY WITH RETROGRADE PYELOGRAM, URETERAL STENT PLACEMENT;  Surgeon: Franchot Gallo, MD;  Location: [facility];  Service: Urology;  Laterality: Right;  . HEMIARTHROPLASTY HIP Right 11/2016   fractured  . LUMBAR FUSION  JULY 2013  . NEPHROLITHOTOMY  X2 YRS AGO  . TOTAL ABDOMINAL HYSTERECTOMY W/ BILATERAL SALPINGOOPHORECTOMY  1989  . TOTAL KNEE ARTHROPLASTY Bilateral 1992  &  1996   . TRANSTHORACIC ECHOCARDIOGRAM  06-07-2013   LVSF 45-50% /  MILD DILATED LA  . URETEROSCOPY Right 11/21/2013   Procedure: URETEROSCOPY WITH STENT REMOVAL AND REPLACEMENT;  Surgeon: Ailene Rud, MD;  Location: [facility];  Service: Urology;  Laterality: Right;    Family History  Problem Relation Age of Onset  . Heart disease Mother   . Heart failure Mother   . Cancer Mother   . Heart disease Father   . Heart failure Father    Social History:  reports that she has never smoked. She has never used smokeless tobacco. She reports that she does not drink alcohol or use drugs.  Allergies:  Allergies  Allergen Reactions  . Codeine Nausea Only and Other (See Comments)    hallucinations  . Hydrocodone Nausea Only and Other (See Comments)  . Macrodantin [Nitrofurantoin] Nausea And Vomiting    No prescriptions prior to admission.    No results found for this or any previous visit (from the past 48 hour(s)). No results found.  Review of Systems  Constitutional: Negative.  Negative for fever.  HENT: Negative.   Eyes: Negative.   Respiratory: Negative.   Cardiovascular: Negative.   Gastrointestinal: Negative.   Genitourinary: Negative.   Musculoskeletal: Negative.   Skin: Negative.   Neurological: Negative.   Endo/Heme/Allergies: Negative.   Psychiatric/Behavioral: Negative.     Height 5\' 7"  (1.702 m), weight 83.9 kg (185 lb). Physical Exam  Constitutional: She appears well-developed.  HENT:  Head: Normocephalic.  Eyes: Pupils are equal, round, and reactive to light.  Neck: Normal range of motion.  Cardiovascular: Normal rate.   Respiratory: Effort normal.  GI: Soft.  Genitourinary:  Genitourinary Comments: NO CVAT at present.  Neurological: She is alert.  Skin: Skin is warm.  Psychiatric: She has a normal mood and affect.     Assessment/Plan  1 - Rt Chronic Hydronephrosis / Partial Ureteral Sricture- proceed as planned with RIGHT stent  exchange. Risks, benefits, alternatives, expected peri-op course discussed previously and reiterated today. Pt is very experienced with this.   Alexis Frock, MD 04/24/2017, 6:23 AM

## 2017-04-27 ENCOUNTER — Encounter (HOSPITAL_BASED_OUTPATIENT_CLINIC_OR_DEPARTMENT_OTHER): Payer: Self-pay | Admitting: Urology

## 2017-05-13 DIAGNOSIS — K219 Gastro-esophageal reflux disease without esophagitis: Secondary | ICD-10-CM | POA: Diagnosis not present

## 2017-05-13 DIAGNOSIS — D500 Iron deficiency anemia secondary to blood loss (chronic): Secondary | ICD-10-CM | POA: Diagnosis not present

## 2017-05-13 DIAGNOSIS — K921 Melena: Secondary | ICD-10-CM | POA: Diagnosis not present

## 2017-05-27 HISTORY — PX: COLONOSCOPY W/ POLYPECTOMY: SHX1380

## 2017-06-04 ENCOUNTER — Telehealth: Payer: Self-pay

## 2017-06-04 MED ORDER — FLECAINIDE ACETATE 50 MG PO TABS: 75.0000 mg | ORAL_TABLET | Freq: Two times a day (BID) | ORAL | 1 refills | Status: DC

## 2017-06-04 NOTE — Telephone Encounter (Signed)
Refill request sent from pharmacy for refill on Flecainide. Refills provided.

## 2017-06-05 DIAGNOSIS — K648 Other hemorrhoids: Secondary | ICD-10-CM | POA: Diagnosis not present

## 2017-06-05 DIAGNOSIS — K317 Polyp of stomach and duodenum: Secondary | ICD-10-CM | POA: Diagnosis not present

## 2017-06-05 DIAGNOSIS — D122 Benign neoplasm of ascending colon: Secondary | ICD-10-CM | POA: Diagnosis not present

## 2017-06-05 DIAGNOSIS — D120 Benign neoplasm of cecum: Secondary | ICD-10-CM | POA: Diagnosis not present

## 2017-06-05 DIAGNOSIS — K449 Diaphragmatic hernia without obstruction or gangrene: Secondary | ICD-10-CM | POA: Diagnosis not present

## 2017-06-05 DIAGNOSIS — K5730 Diverticulosis of large intestine without perforation or abscess without bleeding: Secondary | ICD-10-CM | POA: Diagnosis not present

## 2017-06-05 DIAGNOSIS — I10 Essential (primary) hypertension: Secondary | ICD-10-CM | POA: Diagnosis not present

## 2017-06-05 DIAGNOSIS — Z8542 Personal history of malignant neoplasm of other parts of uterus: Secondary | ICD-10-CM | POA: Diagnosis not present

## 2017-06-05 DIAGNOSIS — D509 Iron deficiency anemia, unspecified: Secondary | ICD-10-CM | POA: Diagnosis not present

## 2017-06-05 DIAGNOSIS — Z90710 Acquired absence of both cervix and uterus: Secondary | ICD-10-CM | POA: Diagnosis not present

## 2017-06-05 DIAGNOSIS — K2950 Unspecified chronic gastritis without bleeding: Secondary | ICD-10-CM | POA: Diagnosis not present

## 2017-06-05 DIAGNOSIS — K219 Gastro-esophageal reflux disease without esophagitis: Secondary | ICD-10-CM | POA: Diagnosis not present

## 2017-06-05 DIAGNOSIS — D126 Benign neoplasm of colon, unspecified: Secondary | ICD-10-CM | POA: Diagnosis not present

## 2017-06-05 DIAGNOSIS — Z9049 Acquired absence of other specified parts of digestive tract: Secondary | ICD-10-CM | POA: Diagnosis not present

## 2017-06-10 DIAGNOSIS — S72001D Fracture of unspecified part of neck of right femur, subsequent encounter for closed fracture with routine healing: Secondary | ICD-10-CM | POA: Diagnosis not present

## 2017-06-16 ENCOUNTER — Ambulatory Visit (INDEPENDENT_AMBULATORY_CARE_PROVIDER_SITE_OTHER): Payer: Medicare Other | Admitting: Cardiology

## 2017-06-16 ENCOUNTER — Encounter: Payer: Self-pay | Admitting: Cardiology

## 2017-06-16 VITALS — BP 110/70 | HR 60 | Resp 12 | Ht 67.0 in | Wt 185.0 lb

## 2017-06-16 DIAGNOSIS — I2510 Atherosclerotic heart disease of native coronary artery without angina pectoris: Secondary | ICD-10-CM

## 2017-06-16 DIAGNOSIS — I4891 Unspecified atrial fibrillation: Secondary | ICD-10-CM | POA: Diagnosis not present

## 2017-06-16 DIAGNOSIS — D508 Other iron deficiency anemias: Secondary | ICD-10-CM

## 2017-06-16 NOTE — Progress Notes (Signed)
Cardiology Office Note:    Date:  06/16/2017   ID:  CARNESHIA [name], DOB 1940-12-02, MRN [id number]  PCP:  [name], MD  Cardiologist:  Jenne Campus, MD    Referring MD: [name], MD   Chief Complaint  Patient presents with  . Discuss having a Cardioversion  She's here to talk about potential cardioversion.  History of Present Illness:    [name] [name] is a 76 y.o. female  with persistent atrial fibrillation. She is being anticoagulated for months. She did see Dr. Ola Spurr. He put her on flecainide hoping that she'll convert on the medication to sinus rhythm however that is not the case. She still appears to be in atrial fibrillation. Denies having any chest pain tightness squeezing pressure burning chest. She wants to talk about potential cardioversion. Basically I told her that we facing 3 options [place] #1 being keep going with the present combination of medications hoping that she will go back to normal rhythm however, I told her that chances for her discomfort to sinus rhythm just with this combination of medications of small. Option #2 is to schedule her for elective electrical cardioversion. However, when she find out that I will not be able to the hospital she was very disappointed she said she wants to think it over. Option #3 is to discontinue flecainide and simply concentrated on anticoagulation and controlling her ventricular rate. She told me that she would like to think it over and let me know which way she would like to proceed.  Past Medical History:  Diagnosis Date  . Anticoagulant long-term use    ELIQUIS  . Dyspnea    w exertion d/t Afib per pt  . Family history of adverse reaction to anesthesia   . GERD (gastroesophageal reflux disease)   . Hematuria    intermittently d/t JJ stent  . History of kidney stones   . History of septic shock    2011 and 06-04-2013  w/ acute renal failure  . History of uterine cancer    STAGE I  --  S/P TAH W/ BSO   (NO OTHER TX)  . Hyperlipidemia   . Hypertension   . Iron deficiency anemia    had appt w/ dr Bobby Rumpf (hematologist) 01/ 2018  dx iron def. anemia  s/p  IV Iron infusion and taking oral iron supplement  . PAF (paroxysmal atrial fibrillation) [facility]) DX JUNE 2012   CARDIOLOGIST--  DR KRASOWSKI ([facility] , [place])  CHADS2  . Ureteral stricture, right    has JJ stent  . Wears glasses     Past Surgical History:  Procedure Laterality Date  . CHOLECYSTECTOMY  2010  . CYSTO/  BALLOON DILATION RIGHT URETERAL STRICTURE/ STENT PLACEMENT  06-02-2013  . CYSTO/  BILATERAL RETROGRADE PYELOGRAM/ RIGHT URETEROSCOPY AND STENT PLACEMENT  10-04-2005  . CYSTO/ LEFT URETEROSCOPIC STONE EXTRACTION  04-03-2006  . CYSTOSCOPY W/ URETERAL STENT PLACEMENT Right 02/06/2014   Procedure: CYSTOSCOPY WITH RIGHT RETROGRADE PYELOGRAM RIGHT STENT REMOVAL and REPLACEMENT, Right Ureteroscopy;  Surgeon: Ailene Rud, MD;  Location: [facility];  Service: Urology;  Laterality: Right;  . CYSTOSCOPY W/ URETERAL STENT PLACEMENT Right 11/26/2016   Procedure: CYSTOSCOPY WITH RETROGRADE PYELOGRAM/URETERAL STENT REPLACEMENT;  Surgeon: Alexis Frock, MD;  [facility]  Service: Urology;  Laterality: Right;  . CYSTOSCOPY W/ URETERAL STENT PLACEMENT Right 04/24/2017   Procedure: CYSTOSCOPY WITH RETROGRADE PYELOGRAM/URETERAL STENT REPLACEMENT;  Surgeon: Alexis Frock, MD;  Location: [facility];  Service: Urology;  Laterality: Right;  . CYSTOSCOPY WITH LITHOLAPAXY N/A 11/21/2013   Procedure: CYSTOSCOPY WITH LITHOLAPAXY;  Surgeon: Ailene Rud, MD;  Location[facility];  Service: Urology;  Laterality: N/A;  . CYSTOSCOPY WITH RETROGRADE PYELOGRAM, URETEROSCOPY AND STENT PLACEMENT Bilateral 03/15/2014   Procedure: CYSTOSCOPY WITH BILATERAL RETROGRADE PYELOGRAM, RIGHT DIAGNOSTIC URETEROSCOPY AND STENT EXCHANGE;  Surgeon: Alexis Frock, MD;   Location: [facility];  Service: Urology;  Laterality: Bilateral;  . CYSTOSCOPY WITH STENT PLACEMENT Right 05/07/2016   Procedure: CYSTOSCOPY WITH RETROGRADE PYELOGRAM, URETERAL STENT PLACEMENT;  Surgeon: Franchot Gallo, MD;  Location: [facility];  Service: Urology;  Laterality: Right;  . HEMIARTHROPLASTY HIP Right 11/2016   fractured  . LUMBAR FUSION  JULY 2013  . NEPHROLITHOTOMY  X2 YRS AGO  . TOTAL ABDOMINAL HYSTERECTOMY W/ BILATERAL SALPINGOOPHORECTOMY  1989  . TOTAL KNEE ARTHROPLASTY Bilateral 1992  &  1996  . TRANSTHORACIC ECHOCARDIOGRAM  06-07-2013   LVSF 45-50% /  MILD DILATED LA  . URETEROSCOPY Right 11/21/2013   Procedure: URETEROSCOPY WITH STENT REMOVAL AND REPLACEMENT;  Surgeon: Ailene Rud, MD;  Location: [facility];  Service: Urology;  Laterality: Right;    Current Medications: Current Meds  Medication Sig  . amitriptyline (ELAVIL) 25 MG tablet Take 25 mg by mouth at bedtime.  [name] apixaban (ELIQUIS) 5 MG TABS tablet Take 5 mg by mouth 2 (two) times daily.  . carvedilol (COREG) 6.25 MG tablet Take 0.5 tablets (3.125 mg total) by mouth 2 (two) times daily with a meal.  . diltiazem (CARDIZEM CD) 240 MG 24 hr capsule Take 240 mg by mouth every morning.   . fenofibrate micronized (LOFIBRA) 134 MG capsule Take 134 mg by mouth daily before breakfast.  . ferrous sulfate 325 (65 FE) MG EC tablet Take 325 mg by mouth 3 (three) times daily with meals.  . flecainide (TAMBOCOR) 50 MG tablet Take 1.5 tablets (75 mg total) by mouth 2 (two) times daily.  . furosemide (LASIX) 40 MG tablet Take 40 mg by mouth daily as needed.   [name] omeprazole (PRILOSEC) 20 MG capsule Take 20 mg by mouth every morning.   . potassium chloride (K-DUR,KLOR-CON) 10 MEQ tablet Take 10 mEq by mouth as needed. TAKES WHEN TAKING LASIX  . senna (SENOKOT) 8.6 MG tablet Take 1 tablet by mouth as needed for constipation.     Allergies:   Codeine; Hydrocodone; and Macrodantin [nitrofurantoin]   Social  History   Social History  . Marital status: Married    Spouse name: N/A  . Number of children: N/A  . Years of education: N/A   Occupational History  . Retired from [employer]    Social History Main Topics  . Smoking status: Never Smoker  . Smokeless tobacco: Never Used  . Alcohol use No  . Drug use: No  . Sexual activity: Not Asked   Other Topics Concern  . None   Social History Narrative   Lives in [place] with husband. Drives, walks some, but not very active at home.      Family History: The patient's family history includes Cancer in her mother; Heart disease in her father and mother; Heart failure in her father and mother. ROS:   Please see the history of present illness.    All 14 point review of systems negative except as described per history of present illness  EKGs/Labs/Other Studies Reviewed:      Recent Labs: 11/26/2016: BUN 13; Creatinine, Ser 1.00  04/24/2017: Hemoglobin 15.6; Potassium 4.3; Sodium 141  Recent Lipid Panel No results found for: CHOL, TRIG, HDL, CHOLHDL, VLDL, LDLCALC, LDLDIRECT  Physical Exam:    VS:  BP 110/70   Pulse 60   Resp 12   Ht 5\' 7"  (1.702 m)   Wt 185 lb (83.9 kg) Comment: Reported, Wheel Chair  BMI 28.98 kg/m     Wt Readings from Last 3 Encounters:  06/16/17 185 lb (83.9 kg)  04/24/17 183 lb (83 kg)  11/26/16 188 lb (85.3 kg)     GEN:  Well nourished, well developed in no acute distress HEENT: Normal NECK: No JVD; No carotid bruits LYMPHATICS: No lymphadenopathy CARDIAC: Irregularly irregular, no murmurs, no rubs, no gallops RESPIRATORY:  Clear to auscultation without rales, wheezing or rhonchi  ABDOMEN: Soft, non-tender, non-distended MUSCULOSKELETAL:  No edema; No deformity  SKIN: Warm and dry LOWER EXTREMITIES: no swelling NEUROLOGIC:  Alert and oriented x 3 PSYCHIATRIC:  Normal affect   ASSESSMENT:    1. Atrial fibrillation with rapid ventricular response ([place])   2. Coronary artery disease involving  native coronary artery of native heart without angina pectoris   3. Other iron deficiency anemia    PLAN:    In order of problems listed above:  1. Atrial fibrillation which appears to be persistent. EKG done today confirmed that the rhythm. Discussion as above. 2. Remote history of coronary artery disease stable asymptomatic continue present management. 3. History of iron deficiency anemia: That being managed by hematologist.   Medication Adjustments/Labs and Tests Ordered: Current medicines are reviewed at length with the patient today.  Concerns regarding medicines are outlined above.  Orders Placed This Encounter  Procedures  . EKG 12-Lead   Medication changes: No orders of the defined types were placed in this encounter.   Signed, Park Liter, MD, [facility] 06/16/2017 11:27 AM    [place]

## 2017-06-16 NOTE — Patient Instructions (Addendum)
Medication Instructions:  Your physician recommends that you continue on your current medications as directed. Please refer to the Current Medication list given to you today.  Labwork: None   Testing/Procedures: EKG today in office.  Follow-Up: Your physician recommends that you schedule a follow-up appointment in: 1 month   Any Other Special Instructions Will Be Listed Below (If Applicable).  Please note that any paperwork needing to be filled out by the provider will need to be addressed at the front desk prior to seeing the provider. Please note that any paperwork FMLA, Disability or other documents regarding health condition is subject to a $25.00 charge that must be received prior to completion of paperwork in the form of a money order or check.    If you need a refill on your cardiac medications before your next appointment, please call your pharmacy.

## 2017-07-07 DIAGNOSIS — Z4689 Encounter for fitting and adjustment of other specified devices: Secondary | ICD-10-CM | POA: Diagnosis not present

## 2017-07-07 DIAGNOSIS — N8111 Cystocele, midline: Secondary | ICD-10-CM | POA: Diagnosis not present

## 2017-07-17 ENCOUNTER — Ambulatory Visit: Payer: Medicare Other | Admitting: Cardiology

## 2017-07-21 ENCOUNTER — Ambulatory Visit (INDEPENDENT_AMBULATORY_CARE_PROVIDER_SITE_OTHER): Payer: Medicare Other | Admitting: Cardiology

## 2017-07-21 ENCOUNTER — Encounter: Payer: Self-pay | Admitting: Cardiology

## 2017-07-21 VITALS — BP 118/70 | HR 84 | Resp 10 | Ht 67.0 in | Wt 193.1 lb

## 2017-07-21 DIAGNOSIS — D508 Other iron deficiency anemias: Secondary | ICD-10-CM

## 2017-07-21 DIAGNOSIS — I2510 Atherosclerotic heart disease of native coronary artery without angina pectoris: Secondary | ICD-10-CM

## 2017-07-21 DIAGNOSIS — Z01818 Encounter for other preprocedural examination: Secondary | ICD-10-CM

## 2017-07-21 DIAGNOSIS — I4891 Unspecified atrial fibrillation: Secondary | ICD-10-CM

## 2017-07-21 NOTE — Progress Notes (Signed)
Cardiology Office Note:    Date:  07/21/2017   ID:  [name] [name], DOB 02/26/1941, MRN [id number]  PCP:  [name], MD  Cardiologist:  Jenne Campus, MD    Referring MD: [name], MD   Chief Complaint  Patient presents with  . 1 month follow up  I want to have cardioversion  History of Present Illness:    [name] [name] is a 76 y.o. female  With persistent atrial fibrillation. She is being still on flecainide and beta blocker as well as calcium channel blocker. In spite of that it still difficult to control her ventricular rate. Last time I spoke to herwe try to make a decision about what to do with this situation she comes today and she decided to proceed with cardioversion. Again I explained procedure to her including all risks benefits as well as alternatives. We will look at his schedule and make arrangements for cardioversion. Laboratory tests will be done before it. I will also ask him to have an ehocardiogram to recheck left ventricular ejection fraction.  Past Medical History:  Diagnosis Date  . Anticoagulant long-term use    ELIQUIS  . Dyspnea    w exertion d/t Afib per pt  . Family history of adverse reaction to anesthesia   . GERD (gastroesophageal reflux disease)   . Hematuria    intermittently d/t JJ stent  . History of kidney stones   . History of septic shock    2011 and 06-04-2013  w/ acute renal failure  . History of uterine cancer    STAGE I  --  S/P TAH W/ BSO  (NO OTHER TX)  . Hyperlipidemia   . Hypertension   . Iron deficiency anemia    had appt w/ dr Bobby Rumpf (hematologist) 01/ 2018  dx iron def. anemia  s/p  IV Iron infusion and taking oral iron supplement  . PAF (paroxysmal atrial fibrillation) [facility]) DX JUNE 2012   CARDIOLOGIST--  DR KRASOWSKI ([place] [facility] , [place])  CHADS2  . Ureteral stricture, right    has JJ stent  . Wears glasses     Past Surgical History:  Procedure Laterality Date  . CHOLECYSTECTOMY  2010  .  CYSTO/  BALLOON DILATION RIGHT URETERAL STRICTURE/ STENT PLACEMENT  06-02-2013  . CYSTO/  BILATERAL RETROGRADE PYELOGRAM/ RIGHT URETEROSCOPY AND STENT PLACEMENT  10-04-2005  . CYSTO/ LEFT URETEROSCOPIC STONE EXTRACTION  04-03-2006  . CYSTOSCOPY W/ URETERAL STENT PLACEMENT Right 02/06/2014   Procedure: CYSTOSCOPY WITH RIGHT RETROGRADE PYELOGRAM RIGHT STENT REMOVAL and REPLACEMENT, Right Ureteroscopy;  Surgeon: Ailene Rud, MD;  Location: [facility];  Service: Urology;  Laterality: Right;  . CYSTOSCOPY W/ URETERAL STENT PLACEMENT Right 11/26/2016   Procedure: CYSTOSCOPY WITH RETROGRADE PYELOGRAM/URETERAL STENT REPLACEMENT;  Surgeon: Alexis Frock, MD;  Location: [facility];  Service: Urology;  Laterality: Right;  . CYSTOSCOPY W/ URETERAL STENT PLACEMENT Right 04/24/2017   Procedure: CYSTOSCOPY WITH RETROGRADE PYELOGRAM/URETERAL STENT REPLACEMENT;  Surgeon: Alexis Frock, MD;  Location: [facility];  Service: Urology;  Laterality: Right;  . CYSTOSCOPY WITH LITHOLAPAXY N/A 11/21/2013   Procedure: CYSTOSCOPY WITH LITHOLAPAXY;  Surgeon: Ailene Rud, MD;  Location: [facility];  Service: Urology;  Laterality: N/A;  . CYSTOSCOPY WITH RETROGRADE PYELOGRAM, URETEROSCOPY AND STENT PLACEMENT Bilateral 03/15/2014   Procedure: CYSTOSCOPY WITH BILATERAL RETROGRADE PYELOGRAM, RIGHT DIAGNOSTIC URETEROSCOPY AND STENT EXCHANGE;  Surgeon: Alexis Frock, MD;  Location: [facility];  Service: Urology;  Laterality: Bilateral;  .  CYSTOSCOPY WITH STENT PLACEMENT Right 05/07/2016   Procedure: CYSTOSCOPY WITH RETROGRADE PYELOGRAM, URETERAL STENT PLACEMENT;  Surgeon: Franchot Gallo, MD;  Location: [facility];  Service: Urology;  Laterality: Right;  . HEMIARTHROPLASTY HIP Right 11/2016   fractured  . LUMBAR FUSION  JULY 2013  . NEPHROLITHOTOMY  X2 YRS AGO  . TOTAL ABDOMINAL HYSTERECTOMY W/ BILATERAL SALPINGOOPHORECTOMY  1989  . TOTAL KNEE ARTHROPLASTY  Bilateral 1992  &  1996  . TRANSTHORACIC ECHOCARDIOGRAM  06-07-2013   LVSF 45-50% /  MILD DILATED LA  . URETEROSCOPY Right 11/21/2013   Procedure: URETEROSCOPY WITH STENT REMOVAL AND REPLACEMENT;  Surgeon: Ailene Rud, MD;  Location: [facility];  Service: Urology;  Laterality: Right;    Current Medications: Current Meds  Medication Sig  . amitriptyline (ELAVIL) 25 MG tablet Take 25 mg by mouth at bedtime.  [name] apixaban (ELIQUIS) 5 MG TABS tablet Take 5 mg by mouth 2 (two) times daily.  . carvedilol (COREG) 6.25 MG tablet Take 0.5 tablets (3.125 mg total) by mouth 2 (two) times daily with a meal.  . diltiazem (CARDIZEM CD) 240 MG 24 hr capsule Take 240 mg by mouth every morning.   . ferrous sulfate 325 (65 FE) MG EC tablet Take 325 mg by mouth 3 (three) times daily with meals.  . flecainide (TAMBOCOR) 50 MG tablet Take 1.5 tablets (75 mg total) by mouth 2 (two) times daily.  . furosemide (LASIX) 40 MG tablet Take 40 mg by mouth daily as needed.   [name] omeprazole (PRILOSEC) 20 MG capsule Take 20 mg by mouth every morning.   . potassium chloride (K-DUR,KLOR-CON) 10 MEQ tablet Take 10 mEq by mouth as needed. TAKES WHEN TAKING LASIX  . senna (SENOKOT) 8.6 MG tablet Take 1 tablet by mouth as needed for constipation.     Allergies:   Codeine; Hydrocodone; and Macrodantin [nitrofurantoin]   Social History   Social History  . Marital status: Married    Spouse name: N/A  . Number of children: N/A  . Years of education: N/A   Occupational History  . Retired from [employer]    Social History Main Topics  . Smoking status: Never Smoker  . Smokeless tobacco: Never Used  . Alcohol use No  . Drug use: No  . Sexual activity: Not Asked   Other Topics Concern  . None   Social History Narrative   Lives in [place] with husband. Drives, walks some, but not very active at home.      Family History: The patient's family history includes Cancer in her mother; Heart disease  in her father and mother; Heart failure in her father and mother. ROS:   Please see the history of present illness.    All 14 point review of systems negative except as described per history of present illness  EKGs/Labs/Other Studies Reviewed:      Recent Labs: 11/26/2016: BUN 13; Creatinine, Ser 1.00 04/24/2017: Hemoglobin 15.6; Potassium 4.3; Sodium 141  Recent Lipid Panel No results found for: CHOL, TRIG, HDL, CHOLHDL, VLDL, LDLCALC, LDLDIRECT  Physical Exam:    VS:  BP 118/70   Pulse 84   Resp 10   Ht 5\' 7"  (1.702 m)   Wt 193 lb 1.9 oz (87.6 kg)   BMI 30.25 kg/m     Wt Readings from Last 3 Encounters:  07/21/17 193 lb 1.9 oz (87.6 kg)  06/16/17 185 lb (83.9 kg)  04/24/17 183 lb (83 kg)     GEN:  Well  nourished, well developed in no acute distress HEENT: Normal NECK: No JVD; No carotid bruits LYMPHATICS: No lymphadenopathy CARDIAC: irregularly irregular, no murmurs, no rubs, no gallops RESPIRATORY:  Clear to auscultation without rales, wheezing or rhonchi  ABDOMEN: Soft, non-tender, non-distended MUSCULOSKELETAL:  No edema; No deformity  SKIN: Warm and dry LOWER EXTREMITIES: no swelling NEUROLOGIC:  Alert and oriented x 3 PSYCHIATRIC:  Normal affect   ASSESSMENT:    1. Coronary artery disease involving native coronary artery of native heart without angina pectoris   2. Atrial fibrillation with rapid ventricular response (HCC)   3. Other iron deficiency anemia    PLAN:    In order of problems listed above:  1. Persistent atrial fibrillation. We'll try to cardiovert her to sinus rhythm. She is anticoagulated and maintain anticoagulation 2. Iron deficiency anemia CBC will be done before procedure 3. Remote history of coronary artery disease. Stable lately no recent issues. 4. She will have echocardiogram done to check left ventricular ejection fraction   Medication Adjustments/Labs and Tests Ordered: Current medicines are reviewed at length with the patient  today.  Concerns regarding medicines are outlined above.  No orders of the defined types were placed in this encounter.  Medication changes: No orders of the defined types were placed in this encounter.   Signed, Park Liter, MD, [facility] 07/21/2017 12:38 PM    [place]

## 2017-07-21 NOTE — Patient Instructions (Addendum)
Medication Instructions:  Your physician recommends that you continue on your current medications as directed. Please refer to the Current Medication list given to you today.  Labwork: Your physician recommends that you have labs drawn for your procedure at which I will arrange for you.  Testing/Procedures: Your physician has requested that you have a Cardioversion. Electrical Cardioversion uses a jolt of electricity to your heart either through paddles or wired patches attached to your chest. This is a controlled, usually prescheduled, procedure. This procedure is done at the hospital and you are not awake during the procedure. You usually go home the day of the procedure. Please see the instruction sheet given to you today for more information.   Follow-Up: Your physician recommends that you schedule a follow-up appointment in: 1 week after your cardioversion    Any Other Special Instructions Will Be Listed Below (If Applicable).  Please note that any paperwork needing to be filled out by the provider will need to be addressed at the front desk prior to seeing the provider. Please note that any paperwork FMLA, Disability or other documents regarding health condition is subject to a $25.00 charge that must be received prior to completion of paperwork in the form of a money order or check.    If you need a refill on your cardiac medications before your next appointment, please call your pharmacy.

## 2017-07-22 NOTE — Addendum Note (Signed)
Addended by: Kathyrn Sheriff on: 07/22/2017 08:51 AM   Modules accepted: Orders

## 2017-07-24 ENCOUNTER — Other Ambulatory Visit: Payer: Self-pay

## 2017-07-24 DIAGNOSIS — I4891 Unspecified atrial fibrillation: Secondary | ICD-10-CM

## 2017-07-24 DIAGNOSIS — D508 Other iron deficiency anemias: Secondary | ICD-10-CM

## 2017-07-24 DIAGNOSIS — Z01818 Encounter for other preprocedural examination: Secondary | ICD-10-CM

## 2017-07-24 LAB — PROTIME-INR

## 2017-07-28 ENCOUNTER — Telehealth: Payer: Self-pay | Admitting: Cardiology

## 2017-07-28 DIAGNOSIS — Z862 Personal history of diseases of the blood and blood-forming organs and certain disorders involving the immune mechanism: Secondary | ICD-10-CM | POA: Diagnosis not present

## 2017-07-28 DIAGNOSIS — K219 Gastro-esophageal reflux disease without esophagitis: Secondary | ICD-10-CM | POA: Diagnosis not present

## 2017-07-28 DIAGNOSIS — E785 Hyperlipidemia, unspecified: Secondary | ICD-10-CM | POA: Diagnosis not present

## 2017-07-28 DIAGNOSIS — Z7901 Long term (current) use of anticoagulants: Secondary | ICD-10-CM | POA: Diagnosis not present

## 2017-07-28 DIAGNOSIS — I480 Paroxysmal atrial fibrillation: Secondary | ICD-10-CM | POA: Diagnosis not present

## 2017-07-28 DIAGNOSIS — I481 Persistent atrial fibrillation: Secondary | ICD-10-CM | POA: Diagnosis not present

## 2017-07-28 DIAGNOSIS — Z79899 Other long term (current) drug therapy: Secondary | ICD-10-CM | POA: Diagnosis not present

## 2017-07-28 DIAGNOSIS — I10 Essential (primary) hypertension: Secondary | ICD-10-CM | POA: Diagnosis not present

## 2017-07-28 DIAGNOSIS — I4891 Unspecified atrial fibrillation: Secondary | ICD-10-CM | POA: Diagnosis not present

## 2017-07-28 MED ORDER — DILTIAZEM HCL ER COATED BEADS 120 MG PO CP24: 120.0000 mg | ORAL_CAPSULE | Freq: Every morning | ORAL | 3 refills | Status: DC

## 2017-07-28 NOTE — Telephone Encounter (Signed)
Has questions about some med instructions that were left at the hospital this morning

## 2017-08-03 ENCOUNTER — Other Ambulatory Visit: Payer: Self-pay

## 2017-08-03 DIAGNOSIS — Z01818 Encounter for other preprocedural examination: Secondary | ICD-10-CM

## 2017-08-03 DIAGNOSIS — I4891 Unspecified atrial fibrillation: Secondary | ICD-10-CM

## 2017-08-04 ENCOUNTER — Encounter: Payer: Self-pay | Admitting: Cardiology

## 2017-08-04 ENCOUNTER — Ambulatory Visit: Payer: Medicare Other

## 2017-08-04 ENCOUNTER — Ambulatory Visit (INDEPENDENT_AMBULATORY_CARE_PROVIDER_SITE_OTHER): Payer: Medicare Other | Admitting: Cardiology

## 2017-08-04 VITALS — BP 110/60 | HR 84 | Resp 12 | Ht 67.0 in | Wt 185.0 lb

## 2017-08-04 DIAGNOSIS — I2510 Atherosclerotic heart disease of native coronary artery without angina pectoris: Secondary | ICD-10-CM | POA: Diagnosis not present

## 2017-08-04 DIAGNOSIS — Z7901 Long term (current) use of anticoagulants: Secondary | ICD-10-CM | POA: Diagnosis not present

## 2017-08-04 DIAGNOSIS — I480 Paroxysmal atrial fibrillation: Secondary | ICD-10-CM

## 2017-08-04 HISTORY — DX: Paroxysmal atrial fibrillation: I48.0

## 2017-08-04 HISTORY — DX: Long term (current) use of anticoagulants: Z79.01

## 2017-08-04 MED ORDER — POTASSIUM CHLORIDE ER 10 MEQ PO TBCR: 10.0000 meq | EXTENDED_RELEASE_TABLET | ORAL | 6 refills | Status: DC | PRN

## 2017-08-04 NOTE — Patient Instructions (Signed)
Medication Instructions:  Your physician has recommended you make the following change in your medication:  1.) START taking potassium supplement (K-DUR) 10 mEq when you take your as needed dose of Lasix.   Labwork: None  Testing/Procedures: EKG today in office.   Follow-Up: Your physician recommends that you schedule a follow-up appointment in: 3 months   Any Other Special Instructions Will Be Listed Below (If Applicable).  Please note that any paperwork needing to be filled out by the provider will need to be addressed at the front desk prior to seeing the provider. Please note that any paperwork FMLA, Disability or other documents regarding health condition is subject to a $25.00 charge that must be received prior to completion of paperwork in the form of a money order or check.    If you need a refill on your cardiac medications before your next appointment, please call your pharmacy.

## 2017-08-04 NOTE — Progress Notes (Signed)
Cardiology Office Note:    Date:  08/04/2017   ID:  [name] [name], DOB 1940-12-09, MRN [id number]  PCP:  [name], MD  Cardiologist:  Jenne Campus, MD    Referring MD: [name], MD   Chief Complaint  Patient presents with  . Follow up Cardioversion  I'm doing better  History of Present Illness:    [name] [name] is a 76 y.o. female  with paroxysmal atrial fibrillation. Few weeks ago she underwent elective cardioversion. She converted to sinus rhythm and being in sinus rhythm since then. She had EKG today which confirmed that. She felt significantly better, more energy palpitations. She is very happy with results of cardioversion. I want her to there is always a chance that she will go back to a defibrillation. She understand this.  Past Medical History:  Diagnosis Date  . Anticoagulant long-term use    ELIQUIS  . Dyspnea    w exertion d/t Afib per pt  . Family history of adverse reaction to anesthesia   . GERD (gastroesophageal reflux disease)   . Hematuria    intermittently d/t JJ stent  . History of kidney stones   . History of septic shock    2011 and 06-04-2013  w/ acute renal failure  . History of uterine cancer    STAGE I  --  S/P TAH W/ BSO  (NO OTHER TX)  . Hyperlipidemia   . Hypertension   . Iron deficiency anemia    had appt w/ dr Bobby Rumpf (hematologist) 01/ 2018  dx iron def. anemia  s/p  IV Iron infusion and taking oral iron supplement  . PAF (paroxysmal atrial fibrillation) [facility]) DX JUNE 2012   CARDIOLOGIST--  DR Jinny Sweetland ([facility] [place])  CHADS2  . Ureteral stricture, right    has JJ stent  . Wears glasses     Past Surgical History:  Procedure Laterality Date  . CHOLECYSTECTOMY  2010  . CYSTO/  BALLOON DILATION RIGHT URETERAL STRICTURE/ STENT PLACEMENT  06-02-2013  . CYSTO/  BILATERAL RETROGRADE PYELOGRAM/ RIGHT URETEROSCOPY AND STENT PLACEMENT  10-04-2005  . CYSTO/ LEFT URETEROSCOPIC STONE EXTRACTION  04-03-2006    . CYSTOSCOPY W/ URETERAL STENT PLACEMENT Right 02/06/2014   Procedure: CYSTOSCOPY WITH RIGHT RETROGRADE PYELOGRAM RIGHT STENT REMOVAL and REPLACEMENT, Right Ureteroscopy;  Surgeon: Ailene Rud, MD;  Location: [facility];  Service: Urology;  Laterality: Right;  . CYSTOSCOPY W/ URETERAL STENT PLACEMENT Right 11/26/2016   Procedure: CYSTOSCOPY WITH RETROGRADE PYELOGRAM/URETERAL STENT REPLACEMENT;  Surgeon: Alexis Frock, MD;  [facility];  Service: Urology;  Laterality: Right;  . CYSTOSCOPY W/ URETERAL STENT PLACEMENT Right 04/24/2017   Procedure: CYSTOSCOPY WITH RETROGRADE PYELOGRAM/URETERAL STENT REPLACEMENT;  Surgeon: Alexis Frock, MD;  Location: [facility];  Service: Urology;  Laterality: Right;  . CYSTOSCOPY WITH LITHOLAPAXY N/A 11/21/2013   Procedure: CYSTOSCOPY WITH LITHOLAPAXY;  Surgeon: Ailene Rud, MD;  Location: [facility];  Service: Urology;  Laterality: N/A;  . CYSTOSCOPY WITH RETROGRADE PYELOGRAM, URETEROSCOPY AND STENT PLACEMENT Bilateral 03/15/2014   Procedure: CYSTOSCOPY WITH BILATERAL RETROGRADE PYELOGRAM, RIGHT DIAGNOSTIC URETEROSCOPY AND STENT EXCHANGE;  Surgeon: Alexis Frock, MD;  Location: [facility];  Service: Urology;  Laterality: Bilateral;  . CYSTOSCOPY WITH STENT PLACEMENT Right 05/07/2016   Procedure: CYSTOSCOPY WITH RETROGRADE PYELOGRAM, URETERAL STENT PLACEMENT;  Surgeon: Franchot Gallo, MD;  Location: [facility];  Service: Urology;  Laterality: Right;  . HEMIARTHROPLASTY HIP Right 11/2016   fractured  .  LUMBAR FUSION  JULY 2013  . NEPHROLITHOTOMY  X2 YRS AGO  . TOTAL ABDOMINAL HYSTERECTOMY W/ BILATERAL SALPINGOOPHORECTOMY  1989  . TOTAL KNEE ARTHROPLASTY Bilateral 1992  &  1996  . TRANSTHORACIC ECHOCARDIOGRAM  06-07-2013   LVSF 45-50% /  MILD DILATED LA  . URETEROSCOPY Right 11/21/2013   Procedure: URETEROSCOPY WITH STENT REMOVAL AND REPLACEMENT;  Surgeon: Ailene Rud, MD;   Location: [facility];  Service: Urology;  Laterality: Right;    Current Medications: Current Meds  Medication Sig  . amitriptyline (ELAVIL) 25 MG tablet Take 25 mg by mouth at bedtime.  [name] apixaban (ELIQUIS) 5 MG TABS tablet Take 5 mg by mouth 2 (two) times daily.  . carvedilol (COREG) 6.25 MG tablet Take 0.5 tablets (3.125 mg total) by mouth 2 (two) times daily with a meal.  . diltiazem (CARDIZEM CD) 120 MG 24 hr capsule Take 1 capsule (120 mg total) by mouth every morning.  . ferrous sulfate 325 (65 FE) MG EC tablet Take 325 mg by mouth 3 (three) times daily with meals.  . flecainide (TAMBOCOR) 50 MG tablet Take 1.5 tablets (75 mg total) by mouth 2 (two) times daily.  . furosemide (LASIX) 40 MG tablet Take 40 mg by mouth daily as needed.   [name] omeprazole (PRILOSEC) 20 MG capsule Take 20 mg by mouth every morning.   . potassium chloride (K-DUR,KLOR-CON) 10 MEQ tablet Take 10 mEq by mouth as needed. TAKES WHEN TAKING LASIX  . senna (SENOKOT) 8.6 MG tablet Take 1 tablet by mouth as needed for constipation.     Allergies:   Codeine; Hydrocodone; and Macrodantin [nitrofurantoin]   Social History   Social History  . Marital status: Married    Spouse name: N/A  . Number of children: N/A  . Years of education: N/A   Occupational History  . Retired from [employer]    Social History Main Topics  . Smoking status: Never Smoker  . Smokeless tobacco: Never Used  . Alcohol use No  . Drug use: No  . Sexual activity: Not Asked   Other Topics Concern  . None   Social History Narrative   Lives in [place] with husband. Drives, walks some, but not very active at home.      Family History: The patient's family history includes Cancer in her mother; Heart disease in her father and mother; Heart failure in her father and mother. ROS:   Please see the history of present illness.    All 14 point review of systems negative except as described per history of present  illness  EKGs/Labs/Other Studies Reviewed:      Recent Labs: 11/26/2016: BUN 13; Creatinine, Ser 1.00 04/24/2017: Hemoglobin 15.6; Potassium 4.3; Sodium 141  Recent Lipid Panel No results found for: CHOL, TRIG, HDL, CHOLHDL, VLDL, LDLCALC, LDLDIRECT  Physical Exam:    VS:  BP 110/60   Pulse 84   Resp 12   Ht 5\' 7"  (1.702 m)   Wt 185 lb (83.9 kg)   BMI 28.98 kg/m     Wt Readings from Last 3 Encounters:  08/04/17 185 lb (83.9 kg)  07/21/17 193 lb 1.9 oz (87.6 kg)  06/16/17 185 lb (83.9 kg)     GEN:  Well nourished, well developed in no acute distress HEENT: Normal NECK: No JVD; No carotid bruits LYMPHATICS: No lymphadenopathy CARDIAC: RRR, no murmurs, no rubs, no gallops RESPIRATORY:  Clear to auscultation without rales, wheezing or rhonchi  ABDOMEN: Soft, non-tender, non-distended MUSCULOSKELETAL:  No edema; No deformity  SKIN: Warm and dry LOWER EXTREMITIES: no swelling NEUROLOGIC:  Alert and oriented x 3 PSYCHIATRIC:  Normal affect   ASSESSMENT:    1. Paroxysmal atrial fibrillation (HCC)   2. Coronary artery disease involving native coronary artery of native heart without angina pectoris   3. Chronic anticoagulation    PLAN:    In order of problems listed above:  1. Paroxysmal atrial fibrillation: Now in sinus rhythm feeling much better will continue anticoagulation and flecainide. EKG today confirms sinus rhythm QT corrected interval is still fine. 2. Coronary artery disease: Stable without symptoms. 3. Chronic anticoagulation: We'll continue.   Medication Adjustments/Labs and Tests Ordered: Current medicines are reviewed at length with the patient today.  Concerns regarding medicines are outlined above.  No orders of the defined types were placed in this encounter.  Medication changes:  Meds ordered this encounter  Medications  . potassium chloride (K-DUR) 10 MEQ tablet    Sig: Take 1 tablet (10 mEq total) by mouth as needed. When you take Lasix.     Dispense:  30 tablet    Refill:  6    Signed, Park Liter, MD, [facility] 08/04/2017 10:44 AM    [place]

## 2017-08-06 DIAGNOSIS — S99921A Unspecified injury of right foot, initial encounter: Secondary | ICD-10-CM | POA: Diagnosis not present

## 2017-08-07 DIAGNOSIS — D509 Iron deficiency anemia, unspecified: Secondary | ICD-10-CM | POA: Diagnosis not present

## 2017-08-12 DIAGNOSIS — Z23 Encounter for immunization: Secondary | ICD-10-CM | POA: Diagnosis not present

## 2017-08-20 ENCOUNTER — Ambulatory Visit: Payer: Medicare Other | Admitting: Cardiology

## 2017-08-21 DIAGNOSIS — Z7982 Long term (current) use of aspirin: Secondary | ICD-10-CM | POA: Diagnosis not present

## 2017-08-21 DIAGNOSIS — D509 Iron deficiency anemia, unspecified: Secondary | ICD-10-CM | POA: Diagnosis not present

## 2017-08-21 DIAGNOSIS — Z862 Personal history of diseases of the blood and blood-forming organs and certain disorders involving the immune mechanism: Secondary | ICD-10-CM | POA: Diagnosis not present

## 2017-08-21 DIAGNOSIS — D473 Essential (hemorrhagic) thrombocythemia: Secondary | ICD-10-CM | POA: Diagnosis not present

## 2017-08-24 DIAGNOSIS — N1330 Unspecified hydronephrosis: Secondary | ICD-10-CM | POA: Diagnosis not present

## 2017-08-24 DIAGNOSIS — R8271 Bacteriuria: Secondary | ICD-10-CM | POA: Diagnosis not present

## 2017-08-24 DIAGNOSIS — N952 Postmenopausal atrophic vaginitis: Secondary | ICD-10-CM | POA: Diagnosis not present

## 2017-08-24 DIAGNOSIS — N200 Calculus of kidney: Secondary | ICD-10-CM | POA: Diagnosis not present

## 2017-08-28 ENCOUNTER — Other Ambulatory Visit: Payer: Self-pay | Admitting: Urology

## 2017-08-31 ENCOUNTER — Other Ambulatory Visit: Payer: Self-pay

## 2017-08-31 MED ORDER — FLECAINIDE ACETATE 50 MG PO TABS: 75.0000 mg | ORAL_TABLET | Freq: Two times a day (BID) | ORAL | 1 refills | Status: DC

## 2017-09-29 DIAGNOSIS — R8271 Bacteriuria: Secondary | ICD-10-CM | POA: Diagnosis not present

## 2017-09-29 DIAGNOSIS — N1330 Unspecified hydronephrosis: Secondary | ICD-10-CM | POA: Diagnosis not present

## 2017-10-01 ENCOUNTER — Other Ambulatory Visit: Payer: Self-pay

## 2017-10-01 ENCOUNTER — Encounter (HOSPITAL_BASED_OUTPATIENT_CLINIC_OR_DEPARTMENT_OTHER): Payer: Self-pay | Admitting: *Deleted

## 2017-10-01 NOTE — Progress Notes (Signed)
SPOKE W/ PT VIA PHONE FOR PRE-OP INTERVIEW.  NPO AFTER MN.  ARRIVE AT 0630.  NEEDS ISTAT.  CURRENT EKG IN CHART AND Epic.  WILL TAKE AM MEDS W/ SIPS OF WATER.

## 2017-10-02 DIAGNOSIS — L219 Seborrheic dermatitis, unspecified: Secondary | ICD-10-CM | POA: Diagnosis not present

## 2017-10-02 DIAGNOSIS — L299 Pruritus, unspecified: Secondary | ICD-10-CM | POA: Diagnosis not present

## 2017-10-07 ENCOUNTER — Encounter (HOSPITAL_BASED_OUTPATIENT_CLINIC_OR_DEPARTMENT_OTHER)
Admission: RE | Disposition: A | Discharge status: Home / Self Care | Payer: Self-pay | Source: Ambulatory Visit | Attending: Urology

## 2017-10-07 ENCOUNTER — Ambulatory Visit (HOSPITAL_BASED_OUTPATIENT_CLINIC_OR_DEPARTMENT_OTHER): Payer: Medicare Other | Admitting: Anesthesiology

## 2017-10-07 ENCOUNTER — Encounter (HOSPITAL_BASED_OUTPATIENT_CLINIC_OR_DEPARTMENT_OTHER): Payer: Self-pay

## 2017-10-07 ENCOUNTER — Ambulatory Visit (HOSPITAL_BASED_OUTPATIENT_CLINIC_OR_DEPARTMENT_OTHER)
Admission: RE | Admit: 2017-10-07 | Discharge: 2017-10-07 | Disposition: A | Discharge status: Home / Self Care | Payer: Medicare Other | Source: Ambulatory Visit | Attending: Urology | Admitting: Urology

## 2017-10-07 DIAGNOSIS — I440 Atrioventricular block, first degree: Secondary | ICD-10-CM | POA: Insufficient documentation

## 2017-10-07 DIAGNOSIS — Z885 Allergy status to narcotic agent status: Secondary | ICD-10-CM | POA: Diagnosis not present

## 2017-10-07 DIAGNOSIS — Z96653 Presence of artificial knee joint, bilateral: Secondary | ICD-10-CM | POA: Diagnosis not present

## 2017-10-07 DIAGNOSIS — Z90710 Acquired absence of both cervix and uterus: Secondary | ICD-10-CM | POA: Diagnosis not present

## 2017-10-07 DIAGNOSIS — Z888 Allergy status to other drugs, medicaments and biological substances status: Secondary | ICD-10-CM | POA: Insufficient documentation

## 2017-10-07 DIAGNOSIS — Z79899 Other long term (current) drug therapy: Secondary | ICD-10-CM | POA: Insufficient documentation

## 2017-10-07 DIAGNOSIS — I2510 Atherosclerotic heart disease of native coronary artery without angina pectoris: Secondary | ICD-10-CM | POA: Diagnosis not present

## 2017-10-07 DIAGNOSIS — N135 Crossing vessel and stricture of ureter without hydronephrosis: Secondary | ICD-10-CM | POA: Diagnosis not present

## 2017-10-07 DIAGNOSIS — Z87442 Personal history of urinary calculi: Secondary | ICD-10-CM | POA: Diagnosis not present

## 2017-10-07 DIAGNOSIS — Z7901 Long term (current) use of anticoagulants: Secondary | ICD-10-CM | POA: Diagnosis not present

## 2017-10-07 DIAGNOSIS — I252 Old myocardial infarction: Secondary | ICD-10-CM | POA: Diagnosis not present

## 2017-10-07 DIAGNOSIS — K219 Gastro-esophageal reflux disease without esophagitis: Secondary | ICD-10-CM | POA: Insufficient documentation

## 2017-10-07 DIAGNOSIS — Z8542 Personal history of malignant neoplasm of other parts of uterus: Secondary | ICD-10-CM | POA: Diagnosis not present

## 2017-10-07 DIAGNOSIS — N131 Hydronephrosis with ureteral stricture, not elsewhere classified: Secondary | ICD-10-CM | POA: Diagnosis not present

## 2017-10-07 DIAGNOSIS — E785 Hyperlipidemia, unspecified: Secondary | ICD-10-CM | POA: Diagnosis not present

## 2017-10-07 DIAGNOSIS — I480 Paroxysmal atrial fibrillation: Secondary | ICD-10-CM | POA: Diagnosis not present

## 2017-10-07 DIAGNOSIS — I10 Essential (primary) hypertension: Secondary | ICD-10-CM | POA: Diagnosis not present

## 2017-10-07 DIAGNOSIS — Z8249 Family history of ischemic heart disease and other diseases of the circulatory system: Secondary | ICD-10-CM | POA: Insufficient documentation

## 2017-10-07 HISTORY — DX: Diaphragmatic hernia without obstruction or gangrene: K44.9

## 2017-10-07 HISTORY — PX: CYSTOSCOPY W/ URETERAL STENT PLACEMENT: SHX1429

## 2017-10-07 HISTORY — DX: Presence of urogenital implants: Z96.0

## 2017-10-07 HISTORY — DX: Atrioventricular block, first degree: I44.0

## 2017-10-07 HISTORY — DX: Personal history of other diseases of the digestive system: Z87.19

## 2017-10-07 HISTORY — DX: Cystocele, midline: N81.11

## 2017-10-07 LAB — POCT I-STAT 4, (NA,K, GLUC, HGB,HCT)
Glucose, Bld: 106 mg/dL — ABNORMAL HIGH (ref 65–99)
HCT: 44 % (ref 36.0–46.0)
Hemoglobin: 15 g/dL (ref 12.0–15.0)
Potassium: 3.8 mmol/L (ref 3.5–5.1)
Sodium: 140 mmol/L (ref 135–145)

## 2017-10-07 SURGERY — CYSTOSCOPY, WITH RETROGRADE PYELOGRAM AND URETERAL STENT INSERTION
Anesthesia: General | Laterality: Right

## 2017-10-07 MED ORDER — FENTANYL CITRATE (PF) 100 MCG/2ML IJ SOLN
INTRAMUSCULAR | Status: AC
  Filled 2017-10-07: qty 2

## 2017-10-07 MED ORDER — PROMETHAZINE HCL 25 MG/ML IJ SOLN
6.2500 mg | INTRAMUSCULAR | Status: DC | PRN
  Filled 2017-10-07: qty 1

## 2017-10-07 MED ORDER — DEXTROSE 5 % IV SOLN
1.0000 g | INTRAVENOUS | Status: AC
  Administered 2017-10-07: 1 g via INTRAVENOUS
  Filled 2017-10-07: qty 10

## 2017-10-07 MED ORDER — LIDOCAINE 2% (20 MG/ML) 5 ML SYRINGE
INTRAMUSCULAR | Status: DC | PRN
  Administered 2017-10-07: 60 mg via INTRAVENOUS

## 2017-10-07 MED ORDER — ONDANSETRON HCL 4 MG/2ML IJ SOLN
INTRAMUSCULAR | Status: AC
  Filled 2017-10-07: qty 2

## 2017-10-07 MED ORDER — DEXTROSE 5 % IV SOLN
INTRAVENOUS | Status: AC
  Filled 2017-10-07: qty 50

## 2017-10-07 MED ORDER — CEFTRIAXONE SODIUM 1 G IJ SOLR
INTRAMUSCULAR | Status: AC
  Filled 2017-10-07: qty 10

## 2017-10-07 MED ORDER — SODIUM CHLORIDE 0.9 % IV SOLN
INTRAVENOUS | Status: DC
  Administered 2017-10-07: 07:00:00 via INTRAVENOUS
  Filled 2017-10-07: qty 1000

## 2017-10-07 MED ORDER — STERILE WATER FOR IRRIGATION IR SOLN
Status: DC | PRN
  Administered 2017-10-07: 500 mL

## 2017-10-07 MED ORDER — FENTANYL CITRATE (PF) 100 MCG/2ML IJ SOLN
25.0000 ug | INTRAMUSCULAR | Status: DC | PRN
  Filled 2017-10-07: qty 1

## 2017-10-07 MED ORDER — MEPERIDINE HCL 25 MG/ML IJ SOLN
6.2500 mg | INTRAMUSCULAR | Status: DC | PRN
  Filled 2017-10-07: qty 1

## 2017-10-07 MED ORDER — LIDOCAINE 2% (20 MG/ML) 5 ML SYRINGE
INTRAMUSCULAR | Status: AC
  Filled 2017-10-07: qty 5

## 2017-10-07 MED ORDER — PROPOFOL 10 MG/ML IV BOLUS
INTRAVENOUS | Status: DC | PRN
  Administered 2017-10-07: 150 mg via INTRAVENOUS

## 2017-10-07 MED ORDER — IOHEXOL 300 MG/ML  SOLN
INTRAMUSCULAR | Status: DC | PRN
  Administered 2017-10-07: 10 mL via URETHRAL

## 2017-10-07 MED ORDER — FENTANYL CITRATE (PF) 100 MCG/2ML IJ SOLN
INTRAMUSCULAR | Status: DC | PRN
  Administered 2017-10-07: 50 ug via INTRAVENOUS

## 2017-10-07 MED ORDER — ONDANSETRON HCL 4 MG/2ML IJ SOLN
INTRAMUSCULAR | Status: DC | PRN
  Administered 2017-10-07: 4 mg via INTRAVENOUS

## 2017-10-07 MED ORDER — LACTATED RINGERS IV SOLN
INTRAVENOUS | Status: DC
  Filled 2017-10-07: qty 1000

## 2017-10-07 MED ORDER — PROPOFOL 10 MG/ML IV BOLUS
INTRAVENOUS | Status: AC
  Filled 2017-10-07: qty 20

## 2017-10-07 SURGICAL SUPPLY — 22 items
BAG DRAIN URO-CYSTO SKYTR STRL (DRAIN) ×2 IMPLANT
BASKET LASER NITINOL 1.9FR (BASKET) IMPLANT
CATH INTERMIT  6FR 70CM (CATHETERS) IMPLANT
CLOTH BEACON ORANGE TIMEOUT ST (SAFETY) ×2 IMPLANT
FIBER LASER FLEXIVA 365 (UROLOGICAL SUPPLIES) IMPLANT
FIBER LASER TRAC TIP (UROLOGICAL SUPPLIES) IMPLANT
GLOVE BIO SURGEON STRL SZ7.5 (GLOVE) ×2 IMPLANT
GOWN STRL REUS W/TWL LRG LVL3 (GOWN DISPOSABLE) ×2 IMPLANT
GUIDEWIRE ANG ZIPWIRE 038X150 (WIRE) ×2 IMPLANT
GUIDEWIRE STR DUAL SENSOR (WIRE) ×2 IMPLANT
INFUSOR MANOMETER BAG 3000ML (MISCELLANEOUS) ×2 IMPLANT
IV NS 1000ML (IV SOLUTION) ×1
IV NS 1000ML BAXH (IV SOLUTION) ×1 IMPLANT
IV NS IRRIG 3000ML ARTHROMATIC (IV SOLUTION) ×2 IMPLANT
KIT RM TURNOVER CYSTO AR (KITS) ×2 IMPLANT
MANIFOLD NEPTUNE II (INSTRUMENTS) ×2 IMPLANT
NS IRRIG 500ML POUR BTL (IV SOLUTION) ×4 IMPLANT
PACK CYSTO (CUSTOM PROCEDURE TRAY) ×2 IMPLANT
STENT POLARIS LOOP 7FR X 24 CM (STENTS) ×2 IMPLANT
SYRINGE 10CC LL (SYRINGE) ×2 IMPLANT
TUBE CONNECTING 12X1/4 (SUCTIONS) IMPLANT
TUBE FEEDING 8FR 16IN STR KANG (MISCELLANEOUS) IMPLANT

## 2017-10-07 NOTE — Anesthesia Procedure Notes (Signed)
Procedure Name: LMA Insertion Date/Time: 10/07/2017 8:38 AM Performed by: Suan Halter, CRNA Pre-anesthesia Checklist: Patient identified, Emergency Drugs available, Suction available and Patient being monitored Patient Re-evaluated:Patient Re-evaluated prior to induction Oxygen Delivery Method: Circle system utilized Preoxygenation: Pre-oxygenation with 100% oxygen Induction Type: IV induction Ventilation: Mask ventilation without difficulty LMA: LMA inserted LMA Size: 4.0 Number of attempts: 1 Airway Equipment and Method: Bite block Placement Confirmation: positive ETCO2 Tube secured with: Tape Dental Injury: Teeth and Oropharynx as per pre-operative assessment

## 2017-10-07 NOTE — Brief Op Note (Signed)
10/07/2017  8:50 AM  PATIENT:  [name] [name]  76 y.o. female  PRE-OPERATIVE DIAGNOSIS:  CHRONIC RIGHT STRICTURE  POST-OPERATIVE DIAGNOSIS:  CHRONIC RIGHT STRICTURE  PROCEDURE:  Procedure(s): CYSTOSCOPY WITH RETROGRADE PYELOGRAM/URETERAL STENT REPLACEMENT (Right)  SURGEON:  Surgeon(s) and Role:    * Alexis Frock, MD - Primary  PHYSICIAN ASSISTANT:   ASSISTANTS: none   ANESTHESIA:   general  EBL:  0 mL   BLOOD ADMINISTERED:none  DRAINS: none   LOCAL MEDICATIONS USED:  Amount: 0 ml  SPECIMEN:  No Specimen  DISPOSITION OF SPECIMEN:  N/A  COUNTS:  YES  TOURNIQUET:  * No tourniquets in log *  DICTATION: .Other Dictation: Dictation Number [id number]  PLAN OF CARE: Discharge to home after PACU  PATIENT DISPOSITION:  PACU - hemodynamically stable.   Delay start of Pharmacological VTE agent (>24hrs) due to surgical blood loss or risk of bleeding: yes

## 2017-10-07 NOTE — Discharge Instructions (Signed)
1 - You may have urinary urgency (bladder spasms) and bloody urine on / off with stent in place. This is normal.  2 - Call MD or go to ER for fever >102, severe pain / nausea / vomiting not relieved by medications, or acute change in medical status  Post Anesthesia Home Care Instructions  Activity: Get plenty of rest for the remainder of the day. A responsible individual must stay with you for 24 hours following the procedure.  For the next 24 hours, DO NOT: -Drive a car -Operate machinery -Drink alcoholic beverages -Take any medication unless instructed by your physician -Make any legal decisions or sign important papers.  Meals: Start with liquid foods such as gelatin or soup. Progress to regular foods as tolerated. Avoid greasy, spicy, heavy foods. If nausea and/or vomiting occur, drink only clear liquids until the nausea and/or vomiting subsides. Call your physician if vomiting continues.  Special Instructions/Symptoms: Your throat may feel dry or sore from the anesthesia or the breathing tube placed in your throat during surgery. If this causes discomfort, gargle with warm salt water. The discomfort should disappear within 24 hours.  If you had a scopolamine patch placed behind your ear for the management of post- operative nausea and/or vomiting:  1. The medication in the patch is effective for 72 hours, after which it should be removed.  Wrap patch in a tissue and discard in the trash. Wash hands thoroughly with soap and water. 2. You may remove the patch earlier than 72 hours if you experience unpleasant side effects which may include dry mouth, dizziness or visual disturbances. 3. Avoid touching the patch. Wash your hands with soap and water after contact with the patch.     

## 2017-10-07 NOTE — H&P (Signed)
[name] [name] is an 76 y.o. female.    Chief Complaint: Pre-op RIGHT Ureteral Stent Exchange  HPI:   1 - Rt Chronic Hydronephrosis / Partial Ureteral Sricture- Multiple prior stone procedures on right including SWL, ureteroscopy, and neph tube. Concern for high grade distal stricure 05/2013 during episode of urosepsis treated with neph tube by antegrade nephrostogram. Formal diagnostic ureteroscopy 02/2014 with several areas of relative narrowing (distal and proximal) but NO frank stricture (accomodated 90F sheath to UPJ).    Recent Course:    04/2016 Contrast CT - mod Rt hydro to just below iliacs, stable Lt non-complex cysts --> 7x24 JJ stent placed. 1 artery/ 1 vein Rt renovascular anatomy.   05/2016 - Renogram Rt 42% / Lt 58% relative function   10/2016 - Exchange Rt stent 7x24 polaris; 04/2017 Exchange Rt stent 7x24 polaris    PMH sig for AFib / Thrombocytosis / Eliquus (primary prevention only, cardioversion 2018) , lap chole, hyst, left open ureterolithotomy, ureteroscopy x several, shockwave lithotripsy.    Today [name] [name] is seen to proceed with RIGHT ureteral stent exchange. She has been on keflex pre-op accordign to most recetn CX with some bacteruria.     Past Medical History:  Diagnosis Date  . Anticoagulant long-term use    ELIQUIS  . Cystocele, midline    followed by dr c. Marvel Plan ([facility] [place])  pt uses pessary  . Dyspnea    w exertion d/t Afib per pt  . First degree heart block   . GERD (gastroesophageal reflux disease)   . Hematuria    intermittently d/t JJ stent  . Hiatal hernia   . History of gastric polyp   . History of kidney stones   . History of septic shock    2011 and 06-04-2013  w/ acute renal failure  . History of uterine cancer    STAGE I  --  S/P TAH W/ BSO  (NO OTHER TX)  . Hyperlipidemia   . Hypertension   . Iron deficiency anemia hematology/ oncology--- dr Bobby Rumpf at [facility] in [place]--- per  lov note 08-07-2017  stabilized and felt to be more chronic anemia   01/ 2018  dx iron def. anemia  s/p  IV Iron infusion and taking oral iron supplement  . PAF (paroxysmal atrial fibrillation) ([place]) DX JUNE 2012   CARDIOLOGIST--  DR Agustin Cree ([place] [facility] [place]--- that office has changed to [facility]))  CHADS2  . Presence of pessary    for midline pessary  . Ureteral stricture, right    has JJ stent  . Wears glasses     Past Surgical History:  Procedure Laterality Date  . CARDIOVERSION  09/ 2018   dr Agustin Cree   successful (NSR )  . CHOLECYSTECTOMY  2010  . CYSTO/  BALLOON DILATION RIGHT URETERAL STRICTURE/ STENT PLACEMENT  06-02-2013  . CYSTO/  BILATERAL RETROGRADE PYELOGRAM/ RIGHT URETEROSCOPY AND STENT PLACEMENT  10-04-2005  . CYSTO/ LEFT URETEROSCOPIC STONE EXTRACTION  04-03-2006  . CYSTOSCOPY W/ URETERAL STENT PLACEMENT Right 02/06/2014   Procedure: CYSTOSCOPY WITH RIGHT RETROGRADE PYELOGRAM RIGHT STENT REMOVAL and REPLACEMENT, Right Ureteroscopy;  Surgeon: Ailene Rud, MD;  Location: [facility];  Service: Urology;  Laterality: Right;  . CYSTOSCOPY W/ URETERAL STENT PLACEMENT Right 11/26/2016   Procedure: CYSTOSCOPY WITH RETROGRADE PYELOGRAM/URETERAL STENT REPLACEMENT;  Surgeon: Alexis Frock, MD;  Location: [facility];  Service: Urology;  Laterality: Right;  . [place]  Right 04/24/2017   Procedure: CYSTOSCOPY WITH RETROGRADE PYELOGRAM/URETERAL STENT REPLACEMENT;  Surgeon: Alexis Frock, MD;  Location: [facility];  Service: Urology;  Laterality: Right;  . CYSTOSCOPY WITH LITHOLAPAXY N/A 11/21/2013   Procedure: CYSTOSCOPY WITH LITHOLAPAXY;  Surgeon: Ailene Rud, MD;  Location: [facility];  Service: Urology;  Laterality: N/A;  . CYSTOSCOPY WITH RETROGRADE PYELOGRAM, URETEROSCOPY AND STENT PLACEMENT Bilateral 03/15/2014   Procedure: CYSTOSCOPY  WITH BILATERAL RETROGRADE PYELOGRAM, RIGHT DIAGNOSTIC URETEROSCOPY AND STENT EXCHANGE;  Surgeon: Alexis Frock, MD;  Location[facility];  Service: Urology;  Laterality: Bilateral;  . CYSTOSCOPY WITH STENT PLACEMENT Right 05/07/2016   Procedure: CYSTOSCOPY WITH RETROGRADE PYELOGRAM, URETERAL STENT PLACEMENT;  Surgeon: Franchot Gallo, MD;  Location: [facility];  Service: Urology;  Laterality: Right;  . HEMIARTHROPLASTY HIP Right 11/2016   fractured  . LUMBAR FUSION  JULY 2013  . NEPHROLITHOTOMY  X2 YRS AGO  . TOTAL ABDOMINAL HYSTERECTOMY W/ BILATERAL SALPINGOOPHORECTOMY  1989  . TOTAL KNEE ARTHROPLASTY Bilateral 1992  &  1996  . TRANSTHORACIC ECHOCARDIOGRAM  07-24-2017   dr Agustin Cree   ef 60-65% (improved from last echo 2014, was 45-50%)/  trace TR/  mild AV sclerosis without stenosis  . URETEROSCOPY Right 11/21/2013   Procedure: URETEROSCOPY WITH STENT REMOVAL AND REPLACEMENT;  Surgeon: Ailene Rud, MD;  Location: [facility];  Service: Urology;  Laterality: Right;    Family History  Problem Relation Age of Onset  . Heart disease Mother   . Heart failure Mother   . Cancer Mother   . Heart disease Father   . Heart failure Father    Social History:  reports that  has never smoked. she has never used smokeless tobacco. She reports that she does not drink alcohol or use drugs.  Allergies:  Allergies  Allergen Reactions  . Codeine Nausea Only and Other (See Comments)    hallucinations  . Hydrocodone Nausea Only and Other (See Comments)  . Macrodantin [Nitrofurantoin] Nausea And Vomiting    Medications Prior to Admission  Medication Sig Dispense Refill  . amitriptyline (ELAVIL) 25 MG tablet Take 25 mg by mouth at bedtime.    [name] apixaban (ELIQUIS) 5 MG TABS tablet Take 5 mg by mouth 2 (two) times daily.     . carvedilol (COREG) 6.25 MG tablet Take 0.5 tablets (3.125 mg total) by mouth 2 (two) times daily with a meal. 60 tablet 1  . diltiazem (CARDIZEM CD) 120 MG 24  hr capsule Take 1 capsule (120 mg total) by mouth every morning. 90 capsule 3  . flecainide (TAMBOCOR) 50 MG tablet Take 1.5 tablets (75 mg total) 2 (two) times daily by mouth. (Patient taking differently: Take 75 mg by mouth 2 (two) times daily. ) 270 tablet 1  . furosemide (LASIX) 40 MG tablet Take 40 mg by mouth daily as needed for fluid.     [name] omeprazole (PRILOSEC) 20 MG capsule Take 20 mg by mouth every morning.     . ferrous sulfate 325 (65 FE) MG EC tablet Take 325 mg by mouth 3 (three) times daily with meals.     . potassium chloride (K-DUR) 10 MEQ tablet Take 1 tablet (10 mEq total) by mouth as needed. When you take Lasix. (Patient taking differently: Take 10 mEq by mouth as needed (take when taking lasix prn). When you take Lasix.) 30 tablet 6    Results for orders placed or performed during the hospital encounter of 10/07/17 (from the past 48  hour(s))  I-STAT 4, (NA,K, GLUC, HGB,HCT)     Status: Abnormal   Collection Time: 10/07/17  7:08 AM  Result Value Ref Range   Sodium 140 135 - 145 mmol/L   Potassium 3.8 3.5 - 5.1 mmol/L   Glucose, Bld 106 (H) 65 - 99 mg/dL   HCT 44.0 36.0 - 46.0 %   Hemoglobin 15.0 12.0 - 15.0 g/dL   No results found.  Review of Systems  Constitutional: Negative.  Negative for chills and fever.  HENT: Negative.   Eyes: Negative.   Respiratory: Negative.   Cardiovascular: Negative.   Gastrointestinal: Negative.   Genitourinary: Positive for urgency.  Musculoskeletal: Negative.   Skin: Negative.   Neurological: Negative.   Endo/Heme/Allergies: Negative.   Psychiatric/Behavioral: Negative.     Blood pressure 130/86, pulse 87, temperature 98 F (36.7 C), temperature source Oral, resp. rate 16, height 5\' 7"  (1.702 m), weight 84.8 kg (187 lb), SpO2 97 %. Physical Exam  Constitutional: She is oriented to person, place, and time. She appears well-developed.  HENT:  Head: Normocephalic.  Eyes: Pupils are equal, round, and reactive to light.  Neck:  Normal range of motion.  Cardiovascular: Normal rate.  Respiratory: Effort normal.  GI:  Stable truncal obesity.   Genitourinary:  Genitourinary Comments: No CVAT  Musculoskeletal: Normal range of motion.  Neurological: She is alert and oriented to person, place, and time.  Skin: Skin is warm.  Psychiatric: She has a normal mood and affect.     Assessment/Plan  Proceed as planned with RIGHT ureteral stent exchange. Risks, benefits, alternatives, expected peri-op course discussed previously and reiterated today.   Alexis Frock, MD 10/07/2017, 7:40 AM

## 2017-10-07 NOTE — Anesthesia Preprocedure Evaluation (Addendum)
Anesthesia Evaluation  Patient identified by MRN, date of birth, ID band Patient awake    Reviewed: Allergy & Precautions, NPO status , Patient's Chart, lab work & pertinent test results, reviewed documented beta blocker date and time   Airway Mallampati: II  TM Distance: >3 FB Neck ROM: Full    Dental  (+) Teeth Intact, Dental Advisory Given   Pulmonary    breath sounds clear to auscultation       Cardiovascular hypertension, Pt. on home beta blockers + CAD and + Past MI  + dysrhythmias Atrial Fibrillation  Rhythm:Regular Rate:Normal     Neuro/Psych  Neuromuscular disease    GI/Hepatic hiatal hernia, GERD  Medicated,  Endo/Other  negative endocrine ROS  Renal/GU Renal disease     Musculoskeletal negative musculoskeletal ROS (+)   Abdominal Normal abdominal exam  (+)   Peds  Hematology   Anesthesia Other Findings - HLD  Reproductive/Obstetrics                           Lab Results  Component Value Date   WBC 17.3 (H) 05/08/2016   HGB 15.0 10/07/2017   HCT 44.0 10/07/2017   MCV 77.2 (L) 05/08/2016   PLT 469 (H) 05/08/2016   EKG: normal sinus rhythm, 1st degree AV block.  Echo: EF: 65%, mild AS  Anesthesia Physical Anesthesia Plan  ASA: III  Anesthesia Plan: General   Post-op Pain Management:    Induction: Intravenous  PONV Risk Score and Plan: 4 or greater and Ondansetron, Dexamethasone and Treatment may vary due to age or medical condition  Airway Management Planned: LMA  Additional Equipment:   Intra-op Plan:   Post-operative Plan: Extubation in OR  Informed Consent: I have reviewed the patients History and Physical, chart, labs and discussed the procedure including the risks, benefits and alternatives for the proposed anesthesia with the patient or authorized representative who has indicated his/her understanding and acceptance.   Dental advisory given  Plan  Discussed with: CRNA  Anesthesia Plan Comments:         Anesthesia Quick Evaluation

## 2017-10-07 NOTE — Anesthesia Postprocedure Evaluation (Signed)
Anesthesia Post Note  Patient: [name] [name]  Procedure(s) Performed: CYSTOSCOPY WITH RETROGRADE PYELOGRAM/URETERAL STENT EXCHANGE (Right )     Patient location during evaluation: PACU Anesthesia Type: General Level of consciousness: awake and alert Pain management: pain level controlled Vital Signs Assessment: post-procedure vital signs reviewed and stable Respiratory status: spontaneous breathing, nonlabored ventilation, respiratory function stable and patient connected to nasal cannula oxygen Cardiovascular status: blood pressure returned to baseline and stable Postop Assessment: no apparent nausea or vomiting Anesthetic complications: no    Last Vitals:  Vitals:   10/07/17 0921 10/07/17 0945  BP:  (!) 124/58  Pulse: 69 (!) 59  Resp: 13 14  Temp:  37.1 C  SpO2: 93% 96%    Last Pain:  Vitals:   10/07/17 0626  TempSrc: Oral                 Effie Berkshire

## 2017-10-07 NOTE — Transfer of Care (Signed)
Immediate Anesthesia Transfer of Care Note  Patient: [name] [name]  Procedure(s) Performed: Procedure(s) (LRB): CYSTOSCOPY WITH RETROGRADE PYELOGRAM/URETERAL STENT EXCHANGE (Right)  Patient Location: PACU  Anesthesia Type: General  Level of Consciousness: awake, oriented, sedated and patient cooperative  Airway & Oxygen Therapy: Patient Spontanous Breathing and Patient connected to face mask oxygen  Post-op Assessment: Report given to PACU RN and Post -op Vital signs reviewed and stable  Post vital signs: Reviewed and stable  Complications: No apparent anesthesia complications  Last Vitals:  Vitals:   10/07/17 0900 10/07/17 0901  BP:  117/68  Pulse: 81 78  Resp:  11  Temp: 36.6 C   SpO2: 94% 94%    Last Pain:  Vitals:   10/07/17 0626  TempSrc: Oral      Patients Stated Pain Goal: 3 (10/07/17 0653)

## 2017-10-08 ENCOUNTER — Encounter (HOSPITAL_BASED_OUTPATIENT_CLINIC_OR_DEPARTMENT_OTHER): Payer: Self-pay | Admitting: Urology

## 2017-10-08 NOTE — Op Note (Signed)
NAME:  COBI, ALDAPE                  ACCOUNT NO.:  MEDICAL RECORD NO.:  [id number]  LOCATION:                                 FACILITY:  PHYSICIAN:  Alexis Frock, MD          DATE OF BIRTH:  DATE OF PROCEDURE: 10/07/2017                              OPERATIVE REPORT   DIAGNOSIS:  Chronic right ureteral stricture.  PROCEDURES: 1. Cystoscopy with right retrograde pyelogram and interpretation. 2. Exchange of right ureteral stent, 7 x 24 Polaris.  ESTIMATED BLOOD LOSS:  Nil.  COMPLICATION:  None.  SPECIMENS:  Right in-situ ureteral stent inspected and intact, discarded.  FINDINGS: 1. Mild hydroureteronephrosis to likely strictures in distal third of     the ureter. 2. Successful replacement of right ureteral stent, proximal in the     renal pelvis and distal in the urinary bladder.  INDICATION:  Ms. Alonzo is a very pleasant 76 year old lady with history of chronic right ureteral stricture.  She has elected management with a chronic stenting exchange q.6 months and she has done quite well with this for number of years.  She is due for stent change.  Informed consent was obtained and placed in the medical record.  Notably, she has long history of asymptomatic bacteriuria and has been on culture specific antibiotics preoperatively to reduce colonization.  PROCEDURE IN DETAIL:  The patient being [facility], was verified. Procedure being right ureteral stent change was confirmed.  Procedure was carried out.  Time-out was performed.  Intravenous antibiotics were administered.  General LMA anesthesia was introduced.  The patient was placed into a low lithotomy position.  Sterile field was created by prepping and draping the patient's vagina, introitus and proximal thighs using iodine.  Next, cystourethroscopy was performed using a 22-French rigid cystoscope with offset lens.  Inspection of the urinary bladder revealed distal end of the ureteral stent in situ was  grasped and removed in its entirety, set aside for discard.  It was inspected and intact.  There was mild incrustation.  The right ureteral orifice was then cannulated with a 6-French end-hole catheter and right retrograde pyelogram was obtained.  Right retrograde pyelogram demonstrated a single right ureter with single-system right kidney.  There was mild hydroureteronephrosis to a distinct step-off in the distal third of the ureter consistent with known chronic stricture.  A 0.038 Zip wire was advanced to the level of the lower pole, and a new 7 x 24 Polaris-type stent was placed using cystoscopic and fluoroscopic guidance.  Good proximal and distal deployment were noted.  Bladder was emptied per cystoscope, procedure was then terminated.  The patient tolerated the procedure well.  There were no immediate periprocedural complications.  The patient was taken to the postanesthesia care unit in stable condition.          ______________________________ Alexis Frock, MD     TM/MEDQ  D:  10/07/2017  T:  10/08/2017  Job:  [id number]

## 2017-10-28 DIAGNOSIS — L820 Inflamed seborrheic keratosis: Secondary | ICD-10-CM | POA: Diagnosis not present

## 2017-10-28 DIAGNOSIS — L219 Seborrheic dermatitis, unspecified: Secondary | ICD-10-CM | POA: Diagnosis not present

## 2017-11-10 ENCOUNTER — Ambulatory Visit (INDEPENDENT_AMBULATORY_CARE_PROVIDER_SITE_OTHER): Payer: Medicare Other | Admitting: Cardiology

## 2017-11-10 ENCOUNTER — Encounter: Payer: Self-pay | Admitting: Cardiology

## 2017-11-10 VITALS — BP 110/66 | HR 69 | Ht 67.0 in | Wt 192.0 lb

## 2017-11-10 DIAGNOSIS — I480 Paroxysmal atrial fibrillation: Secondary | ICD-10-CM

## 2017-11-10 DIAGNOSIS — I2510 Atherosclerotic heart disease of native coronary artery without angina pectoris: Secondary | ICD-10-CM

## 2017-11-10 DIAGNOSIS — Z7901 Long term (current) use of anticoagulants: Secondary | ICD-10-CM | POA: Diagnosis not present

## 2017-11-10 NOTE — Progress Notes (Signed)
Cardiology Office Note:    Date:  11/10/2017   ID:  [name] [name], DOB [birth year], MRN [id number]  PCP:  [name], MD  Cardiologist:  Jenne Campus, MD    Referring MD: [name], MD   Chief Complaint  Patient presents with  . Follow-up  Doing well but yesterday she had some palpitations  History of Present Illness:    [name] [name] is a 77 y.o. female with paroxysmal atrial fibrillation.  Recently converted to sinus rhythm and maintained sinus rhythm.  Today's EKG showing normal sinus rhythm.  Yesterday she said she got about half an hour of palpitations she thinks she was in atrial fibrillation.  Maintain anticoagulation.  Denies having chest pain tightness squeezing pressure burning chest.  Past Medical History:  Diagnosis Date  . Anticoagulant long-term use    ELIQUIS  . Cystocele, midline    followed by dr c. Marvel Plan ([facility] [place])  pt uses pessary  . Dyspnea    w exertion d/t Afib per pt  . First degree heart block   . GERD (gastroesophageal reflux disease)   . Hematuria    intermittently d/t JJ stent  . Hiatal hernia   . History of gastric polyp   . History of kidney stones   . History of septic shock    2011 and 06-04-2013  w/ acute renal failure  . History of uterine cancer    STAGE I  --  S/P TAH W/ BSO  (NO OTHER TX)  . Hyperlipidemia   . Hypertension   . Iron deficiency anemia hematology/ oncology--- dr Bobby Rumpf at [facility] in [place]--- per lov note 08-07-2017  stabilized and felt to be more chronic anemia   01/ 2018  dx iron def. anemia  s/p  IV Iron infusion and taking oral iron supplement  . PAF (paroxysmal atrial fibrillation) ([place]) DX JUNE 2012   CARDIOLOGIST--  DR Agustin Cree ([place] [facility] , [place]--- that office has changed to [facility]))  CHADS2  . Presence of pessary    for midline pessary  . Ureteral stricture, right    has JJ stent  . Wears glasses       Past Surgical History:  Procedure Laterality Date  . CARDIOVERSION  09/ 2018   dr Agustin Cree   successful (NSR )  . CHOLECYSTECTOMY  2010  . CYSTO/  BALLOON DILATION RIGHT URETERAL STRICTURE/ STENT PLACEMENT  06-02-2013  . CYSTO/  BILATERAL RETROGRADE PYELOGRAM/ RIGHT URETEROSCOPY AND STENT PLACEMENT  10-04-2005  . CYSTO/ LEFT URETEROSCOPIC STONE EXTRACTION  04-03-2006  . CYSTOSCOPY W/ URETERAL STENT PLACEMENT Right 02/06/2014   Procedure: CYSTOSCOPY WITH RIGHT RETROGRADE PYELOGRAM RIGHT STENT REMOVAL and REPLACEMENT, Right Ureteroscopy;  Surgeon: Ailene Rud, MD;  Location: [facility];  Service: Urology;  Laterality: Right;  . CYSTOSCOPY W/ URETERAL STENT PLACEMENT Right 11/26/2016   Procedure: CYSTOSCOPY WITH RETROGRADE PYELOGRAM/URETERAL STENT REPLACEMENT;  Surgeon: Alexis Frock, MD;  Location: [facility];  Service: Urology;  Laterality: Right;  . CYSTOSCOPY W/ URETERAL STENT PLACEMENT Right 04/24/2017   Procedure: CYSTOSCOPY WITH RETROGRADE PYELOGRAM/URETERAL STENT REPLACEMENT;  Surgeon: Alexis Frock, MD;  Location: [facility];  Service: Urology;  Laterality: Right;  . CYSTOSCOPY W/ URETERAL STENT PLACEMENT Right 10/07/2017   Procedure: CYSTOSCOPY WITH RETROGRADE PYELOGRAM/URETERAL STENT EXCHANGE;  Surgeon: Alexis Frock, MD;  Location: [facility];  Service: Urology;  Laterality: Right;  . CYSTOSCOPY WITH LITHOLAPAXY N/A 11/21/2013  Procedure: CYSTOSCOPY WITH LITHOLAPAXY;  Surgeon: Ailene Rud, MD;  Location: [facility];  Service: Urology;  Laterality: N/A;  . CYSTOSCOPY WITH RETROGRADE PYELOGRAM, URETEROSCOPY AND STENT PLACEMENT Bilateral 03/15/2014   Procedure: CYSTOSCOPY WITH BILATERAL RETROGRADE PYELOGRAM, RIGHT DIAGNOSTIC URETEROSCOPY AND STENT EXCHANGE;  Surgeon: Alexis Frock, MD;  [facility];  Service: Urology;  Laterality: Bilateral;  . CYSTOSCOPY WITH STENT PLACEMENT  Right 05/07/2016   Procedure: CYSTOSCOPY WITH RETROGRADE PYELOGRAM, URETERAL STENT PLACEMENT;  Surgeon: Franchot Gallo, MD;  [facility];  Service: Urology;  Laterality: Right;  . HEMIARTHROPLASTY HIP Right 11/2016   fractured  . LUMBAR FUSION  JULY 2013  . NEPHROLITHOTOMY  X2 YRS AGO  . TOTAL ABDOMINAL HYSTERECTOMY W/ BILATERAL SALPINGOOPHORECTOMY  1989  . TOTAL KNEE ARTHROPLASTY Bilateral 1992  &  1996  . TRANSTHORACIC ECHOCARDIOGRAM  07-24-2017   dr Agustin Cree   ef 60-65% (improved from last echo 2014, was 45-50%)/  trace TR/  mild AV sclerosis without stenosis  . URETEROSCOPY Right 11/21/2013   Procedure: URETEROSCOPY WITH STENT REMOVAL AND REPLACEMENT;  Surgeon: Ailene Rud, MD;  Location: [facility];  Service: Urology;  Laterality: Right;    Current Medications: Current Meds  Medication Sig  . amitriptyline (ELAVIL) 25 MG tablet Take 25 mg by mouth at bedtime.  [name] apixaban (ELIQUIS) 5 MG TABS tablet Take 5 mg by mouth 2 (two) times daily.   . carvedilol (COREG) 6.25 MG tablet Take 0.5 tablets (3.125 mg total) by mouth 2 (two) times daily with a meal.  . diltiazem (CARDIZEM CD) 120 MG 24 hr capsule Take 1 capsule (120 mg total) by mouth every morning.  . ferrous sulfate 325 (65 FE) MG EC tablet Take 325 mg by mouth 3 (three) times daily with meals.   . flecainide (TAMBOCOR) 50 MG tablet Take 1.5 tablets (75 mg total) 2 (two) times daily by mouth. (Patient taking differently: Take 75 mg by mouth 2 (two) times daily. )  . fluconazole (DIFLUCAN) 150 MG tablet Take 1 tablet by mouth 2 (two) times a week.  . furosemide (LASIX) 40 MG tablet Take 40 mg by mouth daily as needed for fluid.   [name] omeprazole (PRILOSEC) 20 MG capsule Take 20 mg by mouth every morning.      Allergies:   Codeine; Hydrocodone; and Macrodantin [nitrofurantoin]   Social History   Socioeconomic History  . Marital status: Married    Spouse name: None  . Number of children: None  .  Years of education: None  . Highest education level: None  Social Needs  . Financial resource strain: None  . Food insecurity - worry: None  . Food insecurity - inability: None  . Transportation needs - medical: None  . Transportation needs - non-medical: None  Occupational History  . Occupation: Retired from [employer]  . Smoking status: Never Smoker  . Smokeless tobacco: Never Used  Substance and Sexual Activity  . Alcohol use: No  . Drug use: No  . Sexual activity: None  Other Topics Concern  . None  Social History Narrative   Lives in [place] with husband. Drives, walks some, but not very active at home.      Family History: The patient's family history includes Cancer in her mother; Heart disease in her father and mother; Heart failure in her father and mother. ROS:   Please see the history of present illness.    All 14 point review of systems negative except  as described per history of present illness  EKGs/Labs/Other Studies Reviewed:    EKG showed normal sinus rhythm at rate of 75, mildly prolonged PR interval, normal QRS complex duration morphology, no ST-T segment changes  Recent Labs: 11/26/2016: BUN 13; Creatinine, Ser 1.00 10/07/2017: Hemoglobin 15.0; Potassium 3.8; Sodium 140  Recent Lipid Panel No results found for: CHOL, TRIG, HDL, CHOLHDL, VLDL, LDLCALC, LDLDIRECT  Physical Exam:    VS:  BP 110/66   Pulse 69   Ht 5\' 7"  (1.702 m)   Wt 192 lb (87.1 kg) Comment: Reported, Wheel Chair  SpO2 97%   BMI 30.07 kg/m     Wt Readings from Last 3 Encounters:  11/10/17 192 lb (87.1 kg)  10/07/17 187 lb (84.8 kg)  08/04/17 185 lb (83.9 kg)     GEN:  Well nourished, well developed in no acute distress HEENT: Normal NECK: No JVD; No carotid bruits LYMPHATICS: No lymphadenopathy CARDIAC: RRR, no murmurs, no rubs, no gallops RESPIRATORY:  Clear to auscultation without rales, wheezing or rhonchi  ABDOMEN: Soft, non-tender,  non-distended MUSCULOSKELETAL:  No edema; No deformity  SKIN: Warm and dry LOWER EXTREMITIES: no swelling NEUROLOGIC:  Alert and oriented x 3 PSYCHIATRIC:  Normal affect   ASSESSMENT:    1. Paroxysmal atrial fibrillation (HCC)   2. Chronic anticoagulation   3. Coronary artery disease involving native coronary artery of native heart without angina pectoris    PLAN:    In order of problems listed above:  1. Paroxysmal atrial fibrillation: Anticoagulated which I will continue.  Discussion as above continue flecainide as advised by EP team 2. Chronic anticoagulation: We will continue. 3. Coronary artery disease: No problems: We will continue present management.  In spite of presence of coronary artery disease we will continue with flecainide   Medication Adjustments/Labs and Tests Ordered: Current medicines are reviewed at length with the patient today.  Concerns regarding medicines are outlined above.  No orders of the defined types were placed in this encounter.  Medication changes: No orders of the defined types were placed in this encounter.   Signed, Park Liter, MD, [facility] 11/10/2017 11:13 AM    [place]

## 2017-11-10 NOTE — Patient Instructions (Signed)
Medication Instructions:  Your physician recommends that you continue on your current medications as directed. Please refer to the Current Medication list given to you today.  Labwork: None ordered  Testing/Procedures: None ordered  Follow-Up: Your physician recommends that you schedule a follow-up appointment in: 3 months with Dr. Krasowski   Any Other Special Instructions Will Be Listed Below (If Applicable).     If you need a refill on your cardiac medications before your next appointment, please call your pharmacy.   

## 2017-11-10 NOTE — Addendum Note (Signed)
Addended by: Aleatha Borer on: 11/10/2017 04:27 PM   Modules accepted: Orders

## 2017-11-18 DIAGNOSIS — L820 Inflamed seborrheic keratosis: Secondary | ICD-10-CM | POA: Diagnosis not present

## 2017-11-18 DIAGNOSIS — L728 Other follicular cysts of the skin and subcutaneous tissue: Secondary | ICD-10-CM | POA: Diagnosis not present

## 2017-11-18 DIAGNOSIS — L219 Seborrheic dermatitis, unspecified: Secondary | ICD-10-CM | POA: Diagnosis not present

## 2017-11-23 ENCOUNTER — Other Ambulatory Visit: Payer: Self-pay | Admitting: Cardiology

## 2017-11-23 DIAGNOSIS — Z1231 Encounter for screening mammogram for malignant neoplasm of breast: Secondary | ICD-10-CM | POA: Diagnosis not present

## 2017-11-23 DIAGNOSIS — N819 Female genital prolapse, unspecified: Secondary | ICD-10-CM | POA: Diagnosis not present

## 2017-11-23 DIAGNOSIS — N8111 Cystocele, midline: Secondary | ICD-10-CM | POA: Diagnosis not present

## 2017-12-10 DIAGNOSIS — E785 Hyperlipidemia, unspecified: Secondary | ICD-10-CM | POA: Diagnosis not present

## 2017-12-10 DIAGNOSIS — I10 Essential (primary) hypertension: Secondary | ICD-10-CM | POA: Diagnosis not present

## 2017-12-10 DIAGNOSIS — Z0000 Encounter for general adult medical examination without abnormal findings: Secondary | ICD-10-CM | POA: Diagnosis not present

## 2017-12-10 DIAGNOSIS — Z79899 Other long term (current) drug therapy: Secondary | ICD-10-CM | POA: Diagnosis not present

## 2017-12-10 DIAGNOSIS — M8580 Other specified disorders of bone density and structure, unspecified site: Secondary | ICD-10-CM | POA: Diagnosis not present

## 2017-12-15 DIAGNOSIS — Z1231 Encounter for screening mammogram for malignant neoplasm of breast: Secondary | ICD-10-CM | POA: Diagnosis not present

## 2018-02-05 DIAGNOSIS — D509 Iron deficiency anemia, unspecified: Secondary | ICD-10-CM | POA: Diagnosis not present

## 2018-02-05 DIAGNOSIS — Z862 Personal history of diseases of the blood and blood-forming organs and certain disorders involving the immune mechanism: Secondary | ICD-10-CM | POA: Diagnosis not present

## 2018-02-05 DIAGNOSIS — Z7901 Long term (current) use of anticoagulants: Secondary | ICD-10-CM | POA: Diagnosis not present

## 2018-02-05 DIAGNOSIS — D473 Essential (hemorrhagic) thrombocythemia: Secondary | ICD-10-CM | POA: Diagnosis not present

## 2018-02-05 DIAGNOSIS — I4891 Unspecified atrial fibrillation: Secondary | ICD-10-CM | POA: Diagnosis not present

## 2018-02-08 ENCOUNTER — Encounter: Payer: Self-pay | Admitting: Cardiology

## 2018-02-08 ENCOUNTER — Ambulatory Visit (INDEPENDENT_AMBULATORY_CARE_PROVIDER_SITE_OTHER): Payer: Medicare Other | Admitting: Cardiology

## 2018-02-08 VITALS — BP 120/60 | HR 88 | Ht 67.0 in | Wt 198.8 lb

## 2018-02-08 DIAGNOSIS — I480 Paroxysmal atrial fibrillation: Secondary | ICD-10-CM | POA: Diagnosis not present

## 2018-02-08 DIAGNOSIS — Z7901 Long term (current) use of anticoagulants: Secondary | ICD-10-CM

## 2018-02-08 DIAGNOSIS — I2510 Atherosclerotic heart disease of native coronary artery without angina pectoris: Secondary | ICD-10-CM | POA: Diagnosis not present

## 2018-02-08 NOTE — Progress Notes (Signed)
Cardiology Office Note:    Date:  02/08/2018   ID:  [name] [name], DOB May 25, 1941, MRN [id number]  PCP:  [name], MD  Cardiologist:  Jenne Campus, MD    Referring MD: [name], MD   Chief Complaint  Patient presents with  . Follow-up  Doing well  History of Present Illness:    [name] [name] is a 77 y.o. female with paroxysmal atrial fibrillation, history of coronary artery disease.  Overall seems to be doing well denies having chest pain tightness squeezing pressure burning chest.  No palpitations overall looks good  Past Medical History:  Diagnosis Date  . Anticoagulant long-term use    ELIQUIS  . Cystocele, midline    followed by dr c. Marvel Plan ([facility] [place])  pt uses pessary  . Dyspnea    w exertion d/t Afib per pt  . First degree heart block   . GERD (gastroesophageal reflux disease)   . Hematuria    intermittently d/t JJ stent  . Hiatal hernia   . History of gastric polyp   . History of kidney stones   . History of septic shock    2011 and 06-04-2013  w/ acute renal failure  . History of uterine cancer    STAGE I  --  S/P TAH W/ BSO  (NO OTHER TX)  . Hyperlipidemia   . Hypertension   . Iron deficiency anemia hematology/ oncology--- dr Bobby Rumpf at [facility] in [place]--- per lov note 08-07-2017  stabilized and felt to be more chronic anemia   01/ 2018  dx iron def. anemia  s/p  IV Iron infusion and taking oral iron supplement  . PAF (paroxysmal atrial fibrillation) ([place]) DX JUNE 2012   CARDIOLOGIST--  DR Agustin Cree ([facility] [place]--- that office has changed to [facility]))  CHADS2  . Presence of pessary    for midline pessary  . Ureteral stricture, right    has JJ stent  . Wears glasses     Past Surgical History:  Procedure Laterality Date  . CARDIOVERSION  09/ 2018   dr Agustin Cree   successful ([facility] )  . CHOLECYSTECTOMY  2010  . CYSTO/  BALLOON DILATION  RIGHT URETERAL STRICTURE/ STENT PLACEMENT  06-02-2013  . CYSTO/  BILATERAL RETROGRADE PYELOGRAM/ RIGHT URETEROSCOPY AND STENT PLACEMENT  10-04-2005  . CYSTO/ LEFT URETEROSCOPIC STONE EXTRACTION  04-03-2006  . CYSTOSCOPY W/ URETERAL STENT PLACEMENT Right 02/06/2014   Procedure: CYSTOSCOPY WITH RIGHT RETROGRADE PYELOGRAM RIGHT STENT REMOVAL and REPLACEMENT, Right Ureteroscopy;  Surgeon: Ailene Rud, MD;  Location: [facility];  Service: Urology;  Laterality: Right;  . CYSTOSCOPY W/ URETERAL STENT PLACEMENT Right 11/26/2016   Procedure: CYSTOSCOPY WITH RETROGRADE PYELOGRAM/URETERAL STENT REPLACEMENT;  Surgeon: Alexis Frock, MD;  Location: [facility];  Service: Urology;  Laterality: Right;  . CYSTOSCOPY W/ URETERAL STENT PLACEMENT Right 04/24/2017   Procedure: CYSTOSCOPY WITH RETROGRADE PYELOGRAM/URETERAL STENT REPLACEMENT;  Surgeon: Alexis Frock, MD;  Location: [facility];  Service: Urology;  Laterality: Right;  . CYSTOSCOPY W/ URETERAL STENT PLACEMENT Right 10/07/2017   Procedure: CYSTOSCOPY WITH RETROGRADE PYELOGRAM/URETERAL STENT EXCHANGE;  Surgeon: Alexis Frock, MD;  Location: [facility];  Service: Urology;  Laterality: Right;  . CYSTOSCOPY WITH LITHOLAPAXY N/A 11/21/2013   Procedure: CYSTOSCOPY WITH LITHOLAPAXY;  Surgeon: Ailene Rud, MD;  Location: [facility];  Service: Urology;  Laterality: N/A;  . CYSTOSCOPY WITH RETROGRADE PYELOGRAM, URETEROSCOPY  AND STENT PLACEMENT Bilateral 03/15/2014   Procedure: CYSTOSCOPY WITH BILATERAL RETROGRADE PYELOGRAM, RIGHT DIAGNOSTIC URETEROSCOPY AND STENT EXCHANGE;  Surgeon: Alexis Frock, MD;  Location: [facility];  Service: Urology;  Laterality: Bilateral;  . CYSTOSCOPY WITH STENT PLACEMENT Right 05/07/2016   Procedure: CYSTOSCOPY WITH RETROGRADE PYELOGRAM, URETERAL STENT PLACEMENT;  Surgeon: Franchot Gallo, MD;  Location: [facility];  Service: Urology;  Laterality:  Right;  . HEMIARTHROPLASTY HIP Right 11/2016   fractured  . LUMBAR FUSION  JULY 2013  . NEPHROLITHOTOMY  X2 YRS AGO  . TOTAL ABDOMINAL HYSTERECTOMY W/ BILATERAL SALPINGOOPHORECTOMY  1989  . TOTAL KNEE ARTHROPLASTY Bilateral 1992  &  1996  . TRANSTHORACIC ECHOCARDIOGRAM  07-24-2017   dr Agustin Cree   ef 60-65% (improved from last echo 2014, was 45-50%)/  trace TR/  mild AV sclerosis without stenosis  . URETEROSCOPY Right 11/21/2013   Procedure: URETEROSCOPY WITH STENT REMOVAL AND REPLACEMENT;  Surgeon: Ailene Rud, MD;  Location: [facility];  Service: Urology;  Laterality: Right;    Current Medications: Current Meds  Medication Sig  . amitriptyline (ELAVIL) 25 MG tablet Take 25 mg by mouth at bedtime.  [name] apixaban (ELIQUIS) 5 MG TABS tablet Take 5 mg by mouth 2 (two) times daily.   . carvedilol (COREG) 6.25 MG tablet Take 0.5 tablets (3.125 mg total) by mouth 2 (two) times daily with a meal.  . diltiazem (CARDIZEM CD) 120 MG 24 hr capsule Take 1 capsule (120 mg total) by mouth every morning.  . ferrous sulfate 325 (65 FE) MG EC tablet Take 325 mg by mouth 3 (three) times daily with meals.   . flecainide (TAMBOCOR) 50 MG tablet Take 1.5 tablets (75 mg total) by mouth 2 (two) times daily.  . fluconazole (DIFLUCAN) 150 MG tablet Take 1 tablet by mouth 2 (two) times a week.  . furosemide (LASIX) 40 MG tablet Take 40 mg by mouth daily as needed for fluid.   [name] omeprazole (PRILOSEC) 20 MG capsule Take 20 mg by mouth every morning.      Allergies:   Codeine; Hydrocodone; and Macrodantin [nitrofurantoin]   Social History   Socioeconomic History  . Marital status: Married    Spouse name: Not on file  . Number of children: Not on file  . Years of education: Not on file  . Highest education level: Not on file  Occupational History  . Occupation: Retired from [employer]  . Financial resource strain: Not on file  . Food insecurity:    Worry: Not on file     Inability: Not on file  . Transportation needs:    Medical: Not on file    Non-medical: Not on file  Tobacco Use  . Smoking status: Never Smoker  . Smokeless tobacco: Never Used  Substance and Sexual Activity  . Alcohol use: No  . Drug use: No  . Sexual activity: Not on file  Lifestyle  . Physical activity:    Days per week: Not on file    Minutes per session: Not on file  . Stress: Not on file  Relationships  . Social connections:    Talks on phone: Not on file    Gets together: Not on file    Attends religious service: Not on file    Active member of club or organization: Not on file    Attends meetings of clubs or organizations: Not on file    Relationship status: Not on file  Other Topics Concern  .  Not on file  Social History Narrative   Lives in [place] with husband. Drives, walks some, but not very active at home.      Family History: The patient's family history includes Cancer in her mother; Heart disease in her father and mother; Heart failure in her father and mother. ROS:   Please see the history of present illness.    All 14 point review of systems negative except as described per history of present illness  EKGs/Labs/Other Studies Reviewed:      Recent Labs: 10/07/2017: Hemoglobin 15.0; Potassium 3.8; Sodium 140  Recent Lipid Panel No results found for: CHOL, TRIG, HDL, CHOLHDL, VLDL, LDLCALC, LDLDIRECT  Physical Exam:    VS:  BP 120/60   Pulse 88   Ht 5\' 7"  (1.702 m)   Wt 198 lb 12.8 oz (90.2 kg)   SpO2 96%   BMI 31.14 kg/m     Wt Readings from Last 3 Encounters:  02/08/18 198 lb 12.8 oz (90.2 kg)  11/10/17 192 lb (87.1 kg)  10/07/17 187 lb (84.8 kg)     GEN:  Well nourished, well developed in no acute distress HEENT: Normal NECK: No JVD; No carotid bruits LYMPHATICS: No lymphadenopathy CARDIAC: RRR, no murmurs, no rubs, no gallops RESPIRATORY:  Clear to auscultation without rales, wheezing or rhonchi  ABDOMEN: Soft, non-tender,  non-distended MUSCULOSKELETAL:  No edema; No deformity  SKIN: Warm and dry LOWER EXTREMITIES: no swelling NEUROLOGIC:  Alert and oriented x 3 PSYCHIATRIC:  Normal affect   ASSESSMENT:    1. Paroxysmal atrial fibrillation (HCC)   2. Chronic anticoagulation   3. Coronary artery disease involving native coronary artery of native heart without angina pectoris    PLAN:    In order of problems listed above:  1. Paroxysmal atrial fibrillation: Maintaining sinus rhythm.  Anticoagulated which I will continue 2. Chronic anticoagulation: We will continue 3. Coronary artery disease: Stable on appropriate medication asymptomatic we will continue present management     Medication Adjustments/Labs and Tests Ordered: Current medicines are reviewed at length with the patient today.  Concerns regarding medicines are outlined above.  No orders of the defined types were placed in this encounter.  Medication changes: No orders of the defined types were placed in this encounter.   Signed, Park Liter, MD, [facility] 02/08/2018 11:50 AM    [place]

## 2018-02-08 NOTE — Patient Instructions (Signed)
Medication Instructions:  Your physician recommends that you continue on your current medications as directed. Please refer to the Current Medication list given to you today.   Labwork: None  Testing/Procedures: You had an EKG today.   Follow-Up: Your physician wants you to follow-up in: 3 months. You will receive a reminder letter in the mail two months in advance. If you don't receive a letter, please call our office to schedule the follow-up appointment.   Any Other Special Instructions Will Be Listed Below (If Applicable).     If you need a refill on your cardiac medications before your next appointment, please call your pharmacy.   

## 2018-02-09 DIAGNOSIS — Z9181 History of falling: Secondary | ICD-10-CM | POA: Diagnosis not present

## 2018-02-09 DIAGNOSIS — N390 Urinary tract infection, site not specified: Secondary | ICD-10-CM | POA: Diagnosis not present

## 2018-02-09 DIAGNOSIS — Z1331 Encounter for screening for depression: Secondary | ICD-10-CM | POA: Diagnosis not present

## 2018-02-09 DIAGNOSIS — Z6833 Body mass index (BMI) 33.0-33.9, adult: Secondary | ICD-10-CM | POA: Diagnosis not present

## 2018-02-10 DIAGNOSIS — N390 Urinary tract infection, site not specified: Secondary | ICD-10-CM | POA: Diagnosis not present

## 2018-02-22 ENCOUNTER — Other Ambulatory Visit: Payer: Self-pay | Admitting: Cardiology

## 2018-02-23 ENCOUNTER — Other Ambulatory Visit: Payer: Self-pay | Admitting: Urology

## 2018-02-23 DIAGNOSIS — N3020 Other chronic cystitis without hematuria: Secondary | ICD-10-CM | POA: Diagnosis not present

## 2018-02-23 DIAGNOSIS — N200 Calculus of kidney: Secondary | ICD-10-CM | POA: Diagnosis not present

## 2018-02-23 DIAGNOSIS — N1330 Unspecified hydronephrosis: Secondary | ICD-10-CM | POA: Diagnosis not present

## 2018-02-24 NOTE — Patient Instructions (Addendum)
[name] [name]  02/24/2018   Your procedure is scheduled on: 03-03-18   Report to [facility] Main  Entrance     Report to admitting at 6:30 AM    Call this number if you have problems the morning of surgery [phone]   Remember: Do not eat food or drink liquids :After Midnight.     Take these medicines the morning of surgery with A SIP OF WATER: Carvedilol (Coreg), Diltiazem (Cardizem), Omeprazole (Prilosec) and Flecainide (Tambocor)                                 You may not have any metal on your body including hair pins and              piercings  Do not wear jewelry, make-up, lotions, powders or perfumes, deodorant             Do not wear nail polish.  Do not shave  48 hours prior to surgery.      Do not bring valuables to the hospital. [place].  Contacts, dentures or bridgework may not be worn into surgery.       Patients discharged the day of surgery will not be allowed to drive home.  Name and phone number of your driver:Harold Sievert [phone]                Please read over the following fact sheets you were given: _____________________________________________________________________             [facility] - Preparing for Surgery Before surgery, you can play an important role.  Because skin is not sterile, your skin needs to be as free of germs as possible.  You can reduce the number of germs on your skin by washing with CHG (chlorahexidine gluconate) soap before surgery.  CHG is an antiseptic cleaner which kills germs and bonds with the skin to continue killing germs even after washing. Please DO NOT use if you have an allergy to CHG or antibacterial soaps.  If your skin becomes reddened/irritated stop using the CHG and inform your nurse when you arrive at Short Stay. Do not shave (including legs and underarms) for at least 48 hours prior to the first CHG shower.  You may shave your  face/neck. Please follow these instructions carefully:  1.  Shower with CHG Soap the night before surgery and the  morning of Surgery.  2.  If you choose to wash your hair, wash your hair first as usual with your  normal  shampoo.  3.  After you shampoo, rinse your hair and body thoroughly to remove the  shampoo.                           4.  Use CHG as you would any other liquid soap.  You can apply chg directly  to the skin and wash                       Gently with a scrungie or clean washcloth.  5.  Apply the CHG Soap to your body ONLY FROM THE NECK DOWN.   Do not use on face/ open  Wound or open sores. Avoid contact with eyes, ears mouth and genitals (private parts).                       Wash face,  Genitals (private parts) with your normal soap.             6.  Wash thoroughly, paying special attention to the area where your surgery  will be performed.  7.  Thoroughly rinse your body with warm water from the neck down.  8.  DO NOT shower/wash with your normal soap after using and rinsing off  the CHG Soap.                9.  Pat yourself dry with a clean towel.            10.  Wear clean pajamas.            11.  Place clean sheets on your bed the night of your first shower and do not  sleep with pets. Day of Surgery : Do not apply any lotions/deodorants the morning of surgery.  Please wear clean clothes to the hospital/surgery center.  FAILURE TO FOLLOW THESE INSTRUCTIONS MAY RESULT IN THE CANCELLATION OF YOUR SURGERY PATIENT SIGNATURE_________________________________  NURSE SIGNATURE__________________________________  ________________________________________________________________________

## 2018-02-24 NOTE — Progress Notes (Signed)
02-08-18 (Epic) EKG  07-24-17 (Epic) ECHO

## 2018-02-26 ENCOUNTER — Encounter (HOSPITAL_COMMUNITY): Payer: Self-pay

## 2018-02-26 ENCOUNTER — Other Ambulatory Visit: Payer: Self-pay

## 2018-02-26 ENCOUNTER — Encounter (HOSPITAL_COMMUNITY)
Admission: RE | Admit: 2018-02-26 | Discharge: 2018-02-26 | Disposition: A | Discharge status: Home / Self Care | Payer: Medicare Other | Source: Ambulatory Visit | Attending: Urology | Admitting: Urology

## 2018-02-26 DIAGNOSIS — Z01812 Encounter for preprocedural laboratory examination: Secondary | ICD-10-CM | POA: Insufficient documentation

## 2018-02-26 DIAGNOSIS — N3592 Unspecified urethral stricture, female: Secondary | ICD-10-CM | POA: Diagnosis not present

## 2018-02-26 LAB — CBC
HCT: 46 % (ref 36.0–46.0)
Hemoglobin: 14.9 g/dL (ref 12.0–15.0)
MCH: 28.2 pg (ref 26.0–34.0)
MCHC: 32.4 g/dL (ref 30.0–36.0)
MCV: 87.1 fL (ref 78.0–100.0)
Platelets: 650 10*3/uL — ABNORMAL HIGH (ref 150–400)
RBC: 5.28 MIL/uL — ABNORMAL HIGH (ref 3.87–5.11)
RDW: 15.3 % (ref 11.5–15.5)
WBC: 10.1 10*3/uL (ref 4.0–10.5)

## 2018-02-26 LAB — BASIC METABOLIC PANEL
Anion gap: 9 (ref 5–15)
BUN: 15 mg/dL (ref 6–20)
CO2: 25 mmol/L (ref 22–32)
Calcium: 9.5 mg/dL (ref 8.9–10.3)
Chloride: 107 mmol/L (ref 101–111)
Creatinine, Ser: 0.8 mg/dL (ref 0.44–1.00)
GFR calc Af Amer: >60 mL/min (ref >60)
GFR calc non Af Amer: >60 mL/min (ref >60)
Glucose, Bld: 129 mg/dL — ABNORMAL HIGH (ref 65–99)
Potassium: 3.8 mmol/L (ref 3.5–5.1)
Sodium: 141 mmol/L (ref 135–145)

## 2018-03-02 NOTE — Anesthesia Preprocedure Evaluation (Addendum)
Anesthesia Evaluation  Patient identified by MRN, date of birth, ID band Patient awake    Reviewed: Allergy & Precautions, NPO status , Patient's Chart, lab work & pertinent test results, reviewed documented beta blocker date and time   Airway Mallampati: II  TM Distance: >3 FB Neck ROM: Full    Dental no notable dental hx. (+) Teeth Intact, Dental Advisory Given   Pulmonary neg pulmonary ROS,    Pulmonary exam normal breath sounds clear to auscultation       Cardiovascular Exercise Tolerance: Good hypertension, Pt. on home beta blockers + CAD and + Past MI  Normal cardiovascular exam+ dysrhythmias Atrial Fibrillation  Rhythm:Regular Rate:Normal  EF 60-65%   Neuro/Psych  Neuromuscular disease negative neurological ROS  negative psych ROS   GI/Hepatic Neg liver ROS, hiatal hernia, GERD  Medicated,  Endo/Other  negative endocrine ROS  Renal/GU Renal diseasenegative Renal ROS  negative genitourinary   Musculoskeletal negative musculoskeletal ROS (+)   Abdominal Normal abdominal exam  (+)   Peds negative pediatric ROS (+)  Hematology  (+) anemia ,   Anesthesia Other Findings All: nitrofurantoin  Reproductive/Obstetrics negative OB ROS                            Lab Results  Component Value Date   CREATININE 0.80 02/26/2018   BUN 15 02/26/2018   NA 141 02/26/2018   K 3.8 02/26/2018   CL 107 02/26/2018   CO2 25 02/26/2018    Lab Results  Component Value Date   WBC 10.1 02/26/2018   HGB 14.9 02/26/2018   HCT 46.0 02/26/2018   MCV 87.1 02/26/2018   PLT 650 (H) 02/26/2018    Anesthesia Physical Anesthesia Plan  ASA: II  Anesthesia Plan: General   Post-op Pain Management:    Induction: Intravenous  PONV Risk Score and Plan: 3 and Treatment may vary due to age or medical condition  Airway Management Planned: LMA  Additional Equipment:   Intra-op Plan:   Post-operative  Plan: Extubation in OR  Informed Consent: I have reviewed the patients History and Physical, chart, labs and discussed the procedure including the risks, benefits and alternatives for the proposed anesthesia with the patient or authorized representative who has indicated his/her understanding and acceptance.   Dental advisory given  Plan Discussed with: CRNA  Anesthesia Plan Comments:         Anesthesia Quick Evaluation

## 2018-03-03 ENCOUNTER — Ambulatory Visit (HOSPITAL_COMMUNITY): Payer: Medicare Other | Admitting: Anesthesiology

## 2018-03-03 ENCOUNTER — Encounter (HOSPITAL_COMMUNITY)
Admission: RE | Disposition: A | Discharge status: Home / Self Care | Payer: Self-pay | Source: Ambulatory Visit | Attending: Urology

## 2018-03-03 ENCOUNTER — Ambulatory Visit (HOSPITAL_COMMUNITY): Payer: Medicare Other

## 2018-03-03 ENCOUNTER — Encounter (HOSPITAL_COMMUNITY): Payer: Self-pay | Admitting: General Practice

## 2018-03-03 ENCOUNTER — Ambulatory Visit (HOSPITAL_COMMUNITY)
Admission: RE | Admit: 2018-03-03 | Discharge: 2018-03-03 | Disposition: A | Discharge status: Home / Self Care | Payer: Medicare Other | Source: Ambulatory Visit | Attending: Urology | Admitting: Urology

## 2018-03-03 DIAGNOSIS — E785 Hyperlipidemia, unspecified: Secondary | ICD-10-CM | POA: Diagnosis not present

## 2018-03-03 DIAGNOSIS — Z96641 Presence of right artificial hip joint: Secondary | ICD-10-CM | POA: Insufficient documentation

## 2018-03-03 DIAGNOSIS — N131 Hydronephrosis with ureteral stricture, not elsewhere classified: Secondary | ICD-10-CM | POA: Insufficient documentation

## 2018-03-03 DIAGNOSIS — K219 Gastro-esophageal reflux disease without esophagitis: Secondary | ICD-10-CM | POA: Insufficient documentation

## 2018-03-03 DIAGNOSIS — Z885 Allergy status to narcotic agent status: Secondary | ICD-10-CM | POA: Diagnosis not present

## 2018-03-03 DIAGNOSIS — Z79899 Other long term (current) drug therapy: Secondary | ICD-10-CM | POA: Insufficient documentation

## 2018-03-03 DIAGNOSIS — N135 Crossing vessel and stricture of ureter without hydronephrosis: Secondary | ICD-10-CM | POA: Diagnosis not present

## 2018-03-03 DIAGNOSIS — Z888 Allergy status to other drugs, medicaments and biological substances status: Secondary | ICD-10-CM | POA: Insufficient documentation

## 2018-03-03 DIAGNOSIS — Z7901 Long term (current) use of anticoagulants: Secondary | ICD-10-CM | POA: Insufficient documentation

## 2018-03-03 DIAGNOSIS — I2510 Atherosclerotic heart disease of native coronary artery without angina pectoris: Secondary | ICD-10-CM | POA: Diagnosis not present

## 2018-03-03 DIAGNOSIS — Z8542 Personal history of malignant neoplasm of other parts of uterus: Secondary | ICD-10-CM | POA: Insufficient documentation

## 2018-03-03 DIAGNOSIS — I10 Essential (primary) hypertension: Secondary | ICD-10-CM | POA: Diagnosis not present

## 2018-03-03 DIAGNOSIS — D649 Anemia, unspecified: Secondary | ICD-10-CM | POA: Diagnosis not present

## 2018-03-03 DIAGNOSIS — I480 Paroxysmal atrial fibrillation: Secondary | ICD-10-CM | POA: Diagnosis not present

## 2018-03-03 DIAGNOSIS — I4891 Unspecified atrial fibrillation: Secondary | ICD-10-CM | POA: Diagnosis not present

## 2018-03-03 HISTORY — PX: CYSTOSCOPY W/ URETERAL STENT PLACEMENT: SHX1429

## 2018-03-03 SURGERY — CYSTOSCOPY, WITH RETROGRADE PYELOGRAM AND URETERAL STENT INSERTION
Anesthesia: General | Laterality: Right

## 2018-03-03 MED ORDER — FENTANYL CITRATE (PF) 100 MCG/2ML IJ SOLN
INTRAMUSCULAR | Status: AC
  Filled 2018-03-03: qty 2

## 2018-03-03 MED ORDER — DEXAMETHASONE SODIUM PHOSPHATE 10 MG/ML IJ SOLN
INTRAMUSCULAR | Status: AC
  Filled 2018-03-03: qty 1

## 2018-03-03 MED ORDER — LACTATED RINGERS IV SOLN: INTRAVENOUS | Status: DC

## 2018-03-03 MED ORDER — PROPOFOL 10 MG/ML IV BOLUS
INTRAVENOUS | Status: AC
  Filled 2018-03-03: qty 20

## 2018-03-03 MED ORDER — LIDOCAINE 2% (20 MG/ML) 5 ML SYRINGE
INTRAMUSCULAR | Status: AC
  Filled 2018-03-03: qty 5

## 2018-03-03 MED ORDER — ACETAMINOPHEN 10 MG/ML IV SOLN: 1000.0000 mg | Freq: Once | INTRAVENOUS | Status: DC | PRN

## 2018-03-03 MED ORDER — 0.9 % SODIUM CHLORIDE (POUR BTL) OPTIME
TOPICAL | Status: DC | PRN
  Administered 2018-03-03: 1000 mL

## 2018-03-03 MED ORDER — CEPHALEXIN 500 MG PO CAPS: 500.0000 mg | ORAL_CAPSULE | Freq: Two times a day (BID) | ORAL | 0 refills | Status: DC

## 2018-03-03 MED ORDER — PROMETHAZINE HCL 25 MG/ML IJ SOLN: 6.2500 mg | INTRAMUSCULAR | Status: DC | PRN

## 2018-03-03 MED ORDER — SODIUM CHLORIDE 0.9 % IV SOLN
2.0000 g | INTRAVENOUS | Status: AC
  Administered 2018-03-03: 2 g via INTRAVENOUS
  Filled 2018-03-03: qty 20

## 2018-03-03 MED ORDER — MEPERIDINE HCL 50 MG/ML IJ SOLN: 6.2500 mg | INTRAMUSCULAR | Status: DC | PRN

## 2018-03-03 MED ORDER — LIDOCAINE 2% (20 MG/ML) 5 ML SYRINGE
INTRAMUSCULAR | Status: DC | PRN
  Administered 2018-03-03: 80 mg via INTRAVENOUS

## 2018-03-03 MED ORDER — HYDROMORPHONE HCL 1 MG/ML IJ SOLN: 0.2500 mg | INTRAMUSCULAR | Status: DC | PRN

## 2018-03-03 MED ORDER — LACTATED RINGERS IV SOLN
INTRAVENOUS | Status: DC | PRN
  Administered 2018-03-03: 08:00:00 via INTRAVENOUS

## 2018-03-03 MED ORDER — ONDANSETRON HCL 4 MG/2ML IJ SOLN
INTRAMUSCULAR | Status: AC
  Filled 2018-03-03: qty 2

## 2018-03-03 MED ORDER — ONDANSETRON HCL 4 MG/2ML IJ SOLN
INTRAMUSCULAR | Status: DC | PRN
  Administered 2018-03-03: 4 mg via INTRAVENOUS

## 2018-03-03 MED ORDER — FENTANYL CITRATE (PF) 100 MCG/2ML IJ SOLN
INTRAMUSCULAR | Status: DC | PRN
  Administered 2018-03-03: 50 ug via INTRAVENOUS

## 2018-03-03 MED ORDER — IOHEXOL 300 MG/ML  SOLN
INTRAMUSCULAR | Status: DC | PRN
  Administered 2018-03-03: 10 mL

## 2018-03-03 MED ORDER — HYDROCODONE-ACETAMINOPHEN 7.5-325 MG PO TABS: 1.0000 | ORAL_TABLET | Freq: Once | ORAL | Status: DC | PRN

## 2018-03-03 MED ORDER — PROPOFOL 10 MG/ML IV BOLUS
INTRAVENOUS | Status: DC | PRN
  Administered 2018-03-03: 150 mg via INTRAVENOUS

## 2018-03-03 SURGICAL SUPPLY — 19 items
BAG URO CATCHER STRL LF (MISCELLANEOUS) ×2 IMPLANT
BASKET ZERO TIP NITINOL 2.4FR (BASKET) IMPLANT
CATH INTERMIT  6FR 70CM (CATHETERS) ×2 IMPLANT
CLOTH BEACON ORANGE TIMEOUT ST (SAFETY) ×2 IMPLANT
COVER FOOTSWITCH UNIV (MISCELLANEOUS) IMPLANT
COVER SURGICAL LIGHT HANDLE (MISCELLANEOUS) IMPLANT
GLOVE BIO SURGEON STRL SZ 6.5 (GLOVE) ×2 IMPLANT
GLOVE BIOGEL M STRL SZ7.5 (GLOVE) ×2 IMPLANT
GLOVE BIOGEL PI IND STRL 6.5 (GLOVE) ×1 IMPLANT
GLOVE BIOGEL PI INDICATOR 6.5 (GLOVE) ×1
GOWN SRG XL XLNG 56XLVL 4 (GOWN DISPOSABLE) ×1 IMPLANT
GOWN STRL NON-REIN XL XLG LVL4 (GOWN DISPOSABLE) ×1
GOWN STRL REUS W/TWL LRG LVL3 (GOWN DISPOSABLE) ×2 IMPLANT
GUIDEWIRE ANG ZIPWIRE 038X150 (WIRE) ×2 IMPLANT
GUIDEWIRE STR DUAL SENSOR (WIRE) ×2 IMPLANT
MANIFOLD NEPTUNE II (INSTRUMENTS) ×2 IMPLANT
PACK CYSTO (CUSTOM PROCEDURE TRAY) ×2 IMPLANT
STENT POLARIS LOOP 8FR X 24 CM (STENTS) ×2 IMPLANT
TUBING CONNECTING 10 (TUBING) ×2 IMPLANT

## 2018-03-03 NOTE — H&P (Signed)
[name] [name] is an 77 y.o. female.    Chief Complaint: Pre=Op RIGHT Ureteral Stent Change  HPI:   1 - Rt Chronic Hydronephrosis / Partial Ureteral Stricture- Multiple prior stone procedures on right including SWL, ureteroscopy, and neph tube. Concern for high grade distal stricure 05/2013 during episode of urosepsis treated with neph tube by antegrade nephrostogram. Formal diagnostic ureteroscopy 02/2014 with several areas of relative narrowing (distal and proximal) but NO frank stricture (accomodated 58F sheath to UPJ).    Recent Course:    04/2016 Contrast CT - mod Rt hydro to just below iliacs, stable Lt non-complex cysts --> 7x24 JJ stent placed. 1 artery/ 1 vein Rt renovascular anatomy.   05/2016 - Renogram Rt 42% / Lt 58% relative function   10/2016 - Exchange Rt stent 7x24 polaris; 04/2017 Exchange Rt stent 7x24 polaris; 09/2017 Exchange Rt stent 7x24 plaris   PMH sig for AFib / Thrombocytosis / Eliquus (primary prevention only, cardioversion 2018) , lap chole, hyst, left open ureterolithotomy, ureteroscopy x several, shockwave lithotripsy.  Today [name] [name] is seen to proceed with RIGHT ureteral stent change. She has been on keflex pre-op for some urinary colonization by most recent UCX. No interval high grade fevers. .     Past Medical History:  Diagnosis Date  . Anticoagulant long-term use    ELIQUIS  . Cystocele, midline    followed by dr c. Marvel Plan ([facility] [place])  pt uses pessary  . Dyspnea    w exertion d/t Afib per pt  . First degree heart block   . GERD (gastroesophageal reflux disease)   . Hematuria    intermittently d/t JJ stent  . Hiatal hernia   . History of gastric polyp   . History of kidney stones   . History of septic shock    2011 and 06-04-2013  w/ acute renal failure  . History of uterine cancer    STAGE I  --  S/P TAH W/ BSO  (NO OTHER TX)  . Hyperlipidemia   . Hypertension   . Iron deficiency anemia hematology/  oncology--- dr Bobby Rumpf at [facility] in [place]--- per lov note 08-07-2017  stabilized and felt to be more chronic anemia   01/ 2018  dx iron def. anemia  s/p  IV Iron infusion and taking oral iron supplement  . PAF (paroxysmal atrial fibrillation) ([place]) DX JUNE 2012   CARDIOLOGIST--  DR Agustin Cree ([facility] , [place]--- that office has changed to [facility]))  CHADS2  . Presence of pessary    for midline pessary  . Ureteral stricture, right    has JJ stent  . Wears glasses     Past Surgical History:  Procedure Laterality Date  . CARDIOVERSION  09/ 2018   dr Agustin Cree   successful ([facility] )  . CHOLECYSTECTOMY  2010  . CYSTO/  BALLOON DILATION RIGHT URETERAL STRICTURE/ STENT PLACEMENT  06-02-2013  . CYSTO/  BILATERAL RETROGRADE PYELOGRAM/ RIGHT URETEROSCOPY AND STENT PLACEMENT  10-04-2005  . CYSTO/ LEFT URETEROSCOPIC STONE EXTRACTION  04-03-2006  . CYSTOSCOPY W/ URETERAL STENT PLACEMENT Right 02/06/2014   Procedure: CYSTOSCOPY WITH RIGHT RETROGRADE PYELOGRAM RIGHT STENT REMOVAL and REPLACEMENT, Right Ureteroscopy;  Surgeon: Ailene Rud, MD;  [facility];  Service: Urology;  Laterality: Right;  . CYSTOSCOPY W/ URETERAL STENT PLACEMENT Right 11/26/2016   Procedure: CYSTOSCOPY WITH RETROGRADE PYELOGRAM/URETERAL STENT REPLACEMENT;  Surgeon: Alexis Frock, MD;  Location: [facility];  Service: Urology;  Laterality: Right;  . CYSTOSCOPY W/ URETERAL STENT PLACEMENT Right 04/24/2017   Procedure: CYSTOSCOPY WITH RETROGRADE PYELOGRAM/URETERAL STENT REPLACEMENT;  Surgeon: Alexis Frock, MD;  Location[facility];  Service: Urology;  Laterality: Right;  . CYSTOSCOPY W/ URETERAL STENT PLACEMENT Right 10/07/2017   Procedure: CYSTOSCOPY WITH RETROGRADE PYELOGRAM/URETERAL STENT EXCHANGE;  Surgeon: Alexis Frock, MD;  Location: [facility];  Service: Urology;  Laterality: Right;  .  CYSTOSCOPY WITH LITHOLAPAXY N/A 11/21/2013   Procedure: CYSTOSCOPY WITH LITHOLAPAXY;  Surgeon: Ailene Rud, MD;  Location: [facility];  Service: Urology;  Laterality: N/A;  . CYSTOSCOPY WITH RETROGRADE PYELOGRAM, URETEROSCOPY AND STENT PLACEMENT Bilateral 03/15/2014   Procedure: CYSTOSCOPY WITH BILATERAL RETROGRADE PYELOGRAM, RIGHT DIAGNOSTIC URETEROSCOPY AND STENT EXCHANGE;  Surgeon: Alexis Frock, MD;  Location: [facility];  Service: Urology;  Laterality: Bilateral;  . CYSTOSCOPY WITH STENT PLACEMENT Right 05/07/2016   Procedure: CYSTOSCOPY WITH RETROGRADE PYELOGRAM, URETERAL STENT PLACEMENT;  Surgeon: Franchot Gallo, MD;  Location: [facility];  Service: Urology;  Laterality: Right;  . HEMIARTHROPLASTY HIP Right 11/2016   fractured  . JOINT REPLACEMENT Right 11/2016   Hip Replacement  . LUMBAR FUSION  JULY 2013  . NEPHROLITHOTOMY  X2 YRS AGO  . TOTAL ABDOMINAL HYSTERECTOMY W/ BILATERAL SALPINGOOPHORECTOMY  1989  . TOTAL KNEE ARTHROPLASTY Bilateral 1992  &  1996  . TRANSTHORACIC ECHOCARDIOGRAM  07-24-2017   dr Agustin Cree   ef 60-65% (improved from last echo 2014, was 45-50%)/  trace TR/  mild AV sclerosis without stenosis  . URETEROSCOPY Right 11/21/2013   Procedure: URETEROSCOPY WITH STENT REMOVAL AND REPLACEMENT;  Surgeon: Ailene Rud, MD;  Location: [facility];  Service: Urology;  Laterality: Right;    Family History  Problem Relation Age of Onset  . Heart disease Mother   . Heart failure Mother   . Cancer Mother   . Heart disease Father   . Heart failure Father    Social History:  reports that she has never smoked. She has never used smokeless tobacco. She reports that she does not drink alcohol or use drugs.  Allergies:  Allergies  Allergen Reactions  . Codeine Nausea Only and Other (See Comments)    hallucinations  . Hydrocodone Nausea Only and Other (See Comments)  . Macrodantin [Nitrofurantoin] Nausea And Vomiting    Medications  Prior to Admission  Medication Sig Dispense Refill  . acetaminophen (TYLENOL) 500 MG tablet Take 1,000 mg by mouth 2 (two) times daily as needed for moderate pain or headache.    [name] amitriptyline (ELAVIL) 25 MG tablet Take 25 mg by mouth at bedtime.    [name] apixaban (ELIQUIS) 5 MG TABS tablet Take 5 mg by mouth 2 (two) times daily.     . carvedilol (COREG) 6.25 MG tablet TAKE 1/2 TABLET BY MOUTH TWICE DAILY 180 tablet 1  . cetirizine (ZYRTEC) 10 MG tablet Take 10 mg by mouth daily as needed for allergies.    [name] diltiazem (CARDIZEM CD) 120 MG 24 hr capsule Take 1 capsule (120 mg total) by mouth every morning. 90 capsule 3  . ferrous sulfate 325 (65 FE) MG EC tablet Take 325 mg by mouth 2 (two) times daily.     . flecainide (TAMBOCOR) 50 MG tablet Take 1.5 tablets (75 mg total) by mouth 2 (two) times daily. 270 tablet 1  . omeprazole (PRILOSEC) 20 MG capsule Take 20 mg by mouth every morning.     . furosemide (LASIX) 40 MG tablet Take  40 mg by mouth daily as needed for fluid.     . potassium chloride (K-DUR) 10 MEQ tablet Take 1 tablet (10 mEq total) by mouth as needed. When you take Lasix. (Patient taking differently: Take 10 mEq by mouth as needed (take when taking lasix prn). When you take Lasix.) 30 tablet 6    No results found for this or any previous visit (from the past 48 hour(s)). No results found.  Review of Systems  Constitutional: Positive for malaise/fatigue.  HENT: Negative.   Eyes: Negative.   Respiratory: Negative.   Cardiovascular: Negative.   Gastrointestinal: Negative.   Genitourinary: Positive for urgency. Negative for flank pain.  Musculoskeletal: Negative.   Skin: Negative.   Neurological: Negative.   Endo/Heme/Allergies: Negative.   Psychiatric/Behavioral: Negative.     Blood pressure (!) 162/83, pulse 71, temperature 100.1 F (37.8 C), temperature source Oral, resp. rate 18, height 5\' 7"  (1.702 m), weight 88.6 kg (195 lb 6 oz), SpO2 95 %. Physical Exam   Constitutional: She appears well-developed.  HENT:  Head: Normocephalic.  Eyes: Pupils are equal, round, and reactive to light.  Neck: Normal range of motion.  Cardiovascular: Normal rate.  Respiratory: Effort normal.  GI: Soft.  Genitourinary:  Genitourinary Comments: No CVAT at present.   Musculoskeletal: Normal range of motion.  Neurological: She is alert.  Skin: Skin is warm.  Psychiatric: She has a normal mood and affect.     Assessment/Plan  Proceed as planned with RIGHT Ureteral stent change. Risks, benefits, alternatives, expected peri-op course discussed previously and reiterated today.   Alexis Frock, MD 03/03/2018, 7:34 AM

## 2018-03-03 NOTE — Transfer of Care (Signed)
Immediate Anesthesia Transfer of Care Note  Patient: [name] [name]  Procedure(s) Performed: CYSTOSCOPY WITH RIGHT RETROGRADE / RIGHT URETERAL STENT EXCHANGE (Right )  Patient Location: PACU  Anesthesia Type:General  Level of Consciousness: drowsy and patient cooperative  Airway & Oxygen Therapy: Patient Spontanous Breathing and Patient connected to face mask oxygen  Post-op Assessment: Report given to RN and Post -op Vital signs reviewed and stable  Post vital signs: Reviewed and stable  Last Vitals:  Vitals Value Taken Time  BP    Temp 36.6 C 03/03/2018  9:00 AM  Pulse    Resp    SpO2      Last Pain:  Vitals:   03/03/18 0655  TempSrc: Oral  PainSc: 4       Patients Stated Pain Goal: 4 (35/82/51 [id number])  Complications: No apparent anesthesia complications

## 2018-03-03 NOTE — Anesthesia Procedure Notes (Signed)
Procedure Name: LMA Insertion Date/Time: 03/03/2018 8:36 AM Performed by: Montel Clock, CRNA Pre-anesthesia Checklist: Patient identified, Emergency Drugs available, Suction available, Patient being monitored and Timeout performed Patient Re-evaluated:Patient Re-evaluated prior to induction Oxygen Delivery Method: Circle system utilized Preoxygenation: Pre-oxygenation with 100% oxygen Induction Type: IV induction LMA: LMA inserted LMA Size: 4.0 Number of attempts: 1 Dental Injury: Teeth and Oropharynx as per pre-operative assessment

## 2018-03-03 NOTE — Op Note (Signed)
NAMEJERICKA, KADAR MEDICAL RECORD [id number] ACCOUNT [id number] DATE OF BIRTH:Jan 22, 1941 FACILITY: [facility] LOCATION: [facility] PHYSICIAN:Anvi Mangal, MD  OPERATIVE REPORT  DATE OF PROCEDURE:  03/03/2018  PREOPERATIVE DIAGNOSIS:  Chronic right ureteral stricture with significant medical and surgical comorbidities.  POSTOPERATIVE DIAGNOSIS:  Chronic right ureteral stricture with significant medical and surgical comorbidities.  PROCEDURE PERFORMED:   1.  Cystoscopy with right pyelogram interpretation. 2.  Exchange of right ureteral stent, 8 x 24 Polaris.  No tether.  ESTIMATED BLOOD LOSS:  Nil.  COMPLICATIONS:  None.  SPECIMEN:  Right ureteral stent for discard.   FINDINGS: 1.  Moderate-to-severe right hydroureteronephrosis to the distal ureter. 2.  Successful replacement of right ureteral stent proximally in the pelvis and distally in the bladder.  INDICATIONS:  The patient is a very pleasant but somewhat unfortunate 77 year old lady with significant medical and surgical comorbidities.  She has a right distal ureteral stricture likely related to prior surgery.  She is not a candidate for major  reconstruction.  She has been managed with chronic stenting exchange every 6 months and has done quite well with this.  She is due for a stent change.  Her stent does appear colonized.  She has been on culture specific antibiotics preoperatively and does  not exhibit any worrisome infectious parameters.  Informed consent was obtained and placed in the medical record.    OPERATION IN DETAIL:  The patient being identified, correct operation being a right ureteral stent change was confirmed.   Procedure timeout was performed and IV antibiotics administered.  General LMA anesthesia induced.  The patient was placed in low  lithotomy position.  A sterile field was prepped and draped at the base of the vagina, introitus, and Bartholins using iodine.  Next, cystourethroscopy was performed  using a 22 French rigid cystoscope with offset lens.    Inspection of the urinary bladder  revealed no diverticula, calcifications or papillary lesions.  There was some mild erythema diffusely consistent with irritation from the stent.  The distal stent was grasped and brought out in its entirety and set aside for discard.  The distal end was  moderately encrusted.  The right ureteral orifice was cannulated with a 6 French end-hole catheter and right retrograde pyelogram was obtained.  Right retrograde pyelogram demonstrated a single right ureter, single system right kidney.  There was moderate to severe hydroureteronephrosis to the distal ureter.  A 0.038 ZIPwire was advanced slowly to the upper pole, at which a new 8 x 24 Polaris  type stent was placed using fluoroscopic guidance.  Good proximal and distal points were noted.  Bladder was emptied per cystoscope removed.  The procedure was then terminated.    The patient tolerated procedure well.  No immediate complications.  The patient was taken to [facility] in stable condition.  AN/NUANCE  D:03/03/2018 T:03/03/2018 JOB:[id number]

## 2018-03-03 NOTE — Brief Op Note (Signed)
03/03/2018  8:52 AM  PATIENT:  [name] [name]  77 y.o. female  PRE-OPERATIVE DIAGNOSIS:  right ureteral stricture  POST-OPERATIVE DIAGNOSIS:  right ureteral stricture  PROCEDURE:  Procedure(s): CYSTOSCOPY WITH RIGHT RETROGRADE / RIGHT URETERAL STENT EXCHANGE (Right)  SURGEON:  Surgeon(s) and Role:    * Alexis Frock, MD - Primary  PHYSICIAN ASSISTANT:   ASSISTANTS: none   ANESTHESIA:   general  EBL:  minimal  BLOOD ADMINISTERED:none  DRAINS: none   LOCAL MEDICATIONS USED:  NONE  SPECIMEN:  Source of Specimen:  Rt ureteral stent for discard  DISPOSITION OF SPECIMEN:  discard  COUNTS:  YES  TOURNIQUET:  * No tourniquets in log *  DICTATION: .Other Dictation: Dictation Number [id number]  PLAN OF CARE: Discharge to home after PACU  PATIENT DISPOSITION:  PACU - hemodynamically stable.   Delay start of Pharmacological VTE agent (>24hrs) due to surgical blood loss or risk of bleeding: yes

## 2018-03-03 NOTE — Discharge Instructions (Signed)
1 - You may have urinary urgency (bladder spasms) and bloody urine on / off with stent in place. This is normal. ° °2 - Call MD or go to ER for fever >102, severe pain / nausea / vomiting not relieved by medications, or acute change in medical status ° °

## 2018-03-03 NOTE — Anesthesia Postprocedure Evaluation (Signed)
Anesthesia Post Note  Patient: [name] [name]  Procedure(s) Performed: CYSTOSCOPY WITH RIGHT RETROGRADE / RIGHT URETERAL STENT EXCHANGE (Right )     Patient location during evaluation: PACU Anesthesia Type: General Level of consciousness: awake and alert Pain management: pain level controlled Vital Signs Assessment: post-procedure vital signs reviewed and stable Respiratory status: spontaneous breathing, nonlabored ventilation, respiratory function stable and patient connected to nasal cannula oxygen Cardiovascular status: blood pressure returned to baseline and stable Postop Assessment: no apparent nausea or vomiting Anesthetic complications: no    Last Vitals:  Vitals:   03/03/18 0940 03/03/18 0957  BP:  (!) 142/75  Pulse:    Resp:    Temp:  (!) 36.2 C  SpO2: 96% 95%    Last Pain:  Vitals:   03/03/18 0900  TempSrc:   PainSc: 0-No pain                 Barnet Glasgow

## 2018-03-24 DIAGNOSIS — Z4689 Encounter for fitting and adjustment of other specified devices: Secondary | ICD-10-CM | POA: Diagnosis not present

## 2018-05-06 ENCOUNTER — Encounter: Payer: Self-pay | Admitting: Cardiology

## 2018-05-06 ENCOUNTER — Ambulatory Visit (INDEPENDENT_AMBULATORY_CARE_PROVIDER_SITE_OTHER): Payer: Medicare Other | Admitting: Cardiology

## 2018-05-06 VITALS — BP 118/82 | HR 84 | Ht 67.0 in | Wt 197.0 lb

## 2018-05-06 DIAGNOSIS — I480 Paroxysmal atrial fibrillation: Secondary | ICD-10-CM

## 2018-05-06 DIAGNOSIS — I2510 Atherosclerotic heart disease of native coronary artery without angina pectoris: Secondary | ICD-10-CM | POA: Diagnosis not present

## 2018-05-06 DIAGNOSIS — I5032 Chronic diastolic (congestive) heart failure: Secondary | ICD-10-CM | POA: Diagnosis not present

## 2018-05-06 DIAGNOSIS — I4891 Unspecified atrial fibrillation: Secondary | ICD-10-CM | POA: Diagnosis not present

## 2018-05-06 NOTE — Patient Instructions (Signed)
Medication Instructions:  Your physician recommends that you continue on your current medications as directed. Please refer to the Current Medication list given to you today.  Labwork: None  Testing/Procedures: None  Follow-Up: Your physician recommends that you schedule a follow-up appointment in: 5 months  Any Other Special Instructions Will Be Listed Below (If Applicable).     If you need a refill on your cardiac medications before your next appointment, please call your pharmacy.   [facility]  Ashley A, RN, BSN  

## 2018-05-06 NOTE — Progress Notes (Signed)
Cardiology Office Note:    Date:  05/06/2018   ID:  [name] [name], DOB [birth year], MRN [id number]  PCP:  [name], MD  Cardiologist:  Jenne Campus, MD    Referring MD: [name], MD   No chief complaint on file. Doing well cardiac wise  History of Present Illness:    [name] [name] is a 77 y.o. female with paroxysmal atrial fibrillation.  Her heart seems to be behaving denies having any palpitation tightness squeezing pressure burning chest only very few times when she wakes up in the way she will feel her heart spitting up somewhat but does not feel like atrial fibrillation.  She is anticoagulated which I will continue she is also on flecainide her EKG done today showed normal sinus rhythm nonspecific ST segment changes QT interval was normal  Past Medical History:  Diagnosis Date  . Anticoagulant long-term use    ELIQUIS  . Cystocele, midline    followed by dr c. Marvel Plan ([facility] [place])  pt uses pessary  . Dyspnea    w exertion d/t Afib per pt  . First degree heart block   . GERD (gastroesophageal reflux disease)   . Hematuria    intermittently d/t JJ stent  . Hiatal hernia   . History of gastric polyp   . History of kidney stones   . History of septic shock    2011 and 06-04-2013  w/ acute renal failure  . History of uterine cancer    STAGE I  --  S/P TAH W/ BSO  (NO OTHER TX)  . Hyperlipidemia   . Hypertension   . Iron deficiency anemia hematology/ oncology--- dr Bobby Rumpf at [facility] in [place]--- per lov note 08-07-2017  stabilized and felt to be more chronic anemia   01/ 2018  dx iron def. anemia  s/p  IV Iron infusion and taking oral iron supplement  . PAF (paroxysmal atrial fibrillation) ([place]) DX JUNE 2012   CARDIOLOGIST--  DR Agustin Cree ([facility] , [place]--- that office has changed to [place] [facility]))  CHADS2  . Presence of pessary    for midline pessary  . Ureteral  stricture, right    has JJ stent  . Wears glasses     Past Surgical History:  Procedure Laterality Date  . CARDIOVERSION  09/ 2018   dr Agustin Cree   successful ([facility] )  . CHOLECYSTECTOMY  2010  . CYSTO/  BALLOON DILATION RIGHT URETERAL STRICTURE/ STENT PLACEMENT  06-02-2013  . CYSTO/  BILATERAL RETROGRADE PYELOGRAM/ RIGHT URETEROSCOPY AND STENT PLACEMENT  10-04-2005  . CYSTO/ LEFT URETEROSCOPIC STONE EXTRACTION  04-03-2006  . CYSTOSCOPY W/ URETERAL STENT PLACEMENT Right 02/06/2014   Procedure: CYSTOSCOPY WITH RIGHT RETROGRADE PYELOGRAM RIGHT STENT REMOVAL and REPLACEMENT, Right Ureteroscopy;  Surgeon: Ailene Rud, MD;  Location: [facility];  Service: Urology;  Laterality: Right;  . CYSTOSCOPY W/ URETERAL STENT PLACEMENT Right 11/26/2016   Procedure: CYSTOSCOPY WITH RETROGRADE PYELOGRAM/URETERAL STENT REPLACEMENT;  Surgeon: Alexis Frock, MD;  Location: [facility];  Service: Urology;  Laterality: Right;  . CYSTOSCOPY W/ URETERAL STENT PLACEMENT Right 04/24/2017   Procedure: CYSTOSCOPY WITH RETROGRADE PYELOGRAM/URETERAL STENT REPLACEMENT;  Surgeon: Alexis Frock, MD;  Location: [facility];  Service: Urology;  Laterality: Right;  . CYSTOSCOPY W/ URETERAL STENT PLACEMENT Right 10/07/2017   Procedure: CYSTOSCOPY WITH RETROGRADE PYELOGRAM/URETERAL STENT EXCHANGE;  Surgeon: Alexis Frock, MD;  Location: [facility];  Service: Urology;  Laterality: Right;  . CYSTOSCOPY W/ URETERAL STENT PLACEMENT Right 03/03/2018   Procedure: CYSTOSCOPY WITH RIGHT RETROGRADE / RIGHT URETERAL STENT EXCHANGE;  Surgeon: Alexis Frock, MD;  Location: [facility];  Service: Urology;  Laterality: Right;  . CYSTOSCOPY WITH LITHOLAPAXY N/A 11/21/2013   Procedure: CYSTOSCOPY WITH LITHOLAPAXY;  Surgeon: Ailene Rud, MD;  Location: [facility];  Service: Urology;  Laterality: N/A;  . CYSTOSCOPY WITH RETROGRADE PYELOGRAM, URETEROSCOPY AND  STENT PLACEMENT Bilateral 03/15/2014   Procedure: CYSTOSCOPY WITH BILATERAL RETROGRADE PYELOGRAM, RIGHT DIAGNOSTIC URETEROSCOPY AND STENT EXCHANGE;  Surgeon: Alexis Frock, MD;  Location: [facility];  Service: Urology;  Laterality: Bilateral;  . CYSTOSCOPY WITH STENT PLACEMENT Right 05/07/2016   Procedure: CYSTOSCOPY WITH RETROGRADE PYELOGRAM, URETERAL STENT PLACEMENT;  Surgeon: Franchot Gallo, MD;  Location: [facility];  Service: Urology;  Laterality: Right;  . HEMIARTHROPLASTY HIP Right 11/2016   fractured  . JOINT REPLACEMENT Right 11/2016   Hip Replacement  . LUMBAR FUSION  JULY 2013  . NEPHROLITHOTOMY  X2 YRS AGO  . TOTAL ABDOMINAL HYSTERECTOMY W/ BILATERAL SALPINGOOPHORECTOMY  1989  . TOTAL KNEE ARTHROPLASTY Bilateral 1992  &  1996  . TRANSTHORACIC ECHOCARDIOGRAM  07-24-2017   dr Agustin Cree   ef 60-65% (improved from last echo 2014, was 45-50%)/  trace TR/  mild AV sclerosis without stenosis  . URETEROSCOPY Right 11/21/2013   Procedure: URETEROSCOPY WITH STENT REMOVAL AND REPLACEMENT;  Surgeon: Ailene Rud, MD;  Location: [facility];  Service: Urology;  Laterality: Right;    Current Medications: Current Meds  Medication Sig  . acetaminophen (TYLENOL) 500 MG tablet Take 1,000 mg by mouth 2 (two) times daily as needed for moderate pain or headache.  [name] amitriptyline (ELAVIL) 25 MG tablet Take 25 mg by mouth at bedtime.  [name] apixaban (ELIQUIS) 5 MG TABS tablet Take 5 mg by mouth 2 (two) times daily.   . carvedilol (COREG) 6.25 MG tablet TAKE 1/2 TABLET BY MOUTH TWICE DAILY  . cephALEXin (KEFLEX) 500 MG capsule Take 1 capsule (500 mg total) by mouth 2 (two) times daily. X 3 days for urinary bacteria  . cetirizine (ZYRTEC) 10 MG tablet Take 10 mg by mouth daily as needed for allergies.  [name] diltiazem (CARDIZEM CD) 120 MG 24 hr capsule Take 1 capsule (120 mg total) by mouth every morning.  . ferrous sulfate 325 (65 FE) MG EC tablet Take 325 mg by mouth 2 (two) times daily.     . flecainide (TAMBOCOR) 50 MG tablet Take 1.5 tablets (75 mg total) by mouth 2 (two) times daily.  . furosemide (LASIX) 40 MG tablet Take 40 mg by mouth daily as needed for fluid.   [name] omeprazole (PRILOSEC) 20 MG capsule Take 20 mg by mouth every morning.      Allergies:   Codeine; Hydrocodone; and Macrodantin [nitrofurantoin]   Social History   Socioeconomic History  . Marital status: Married    Spouse name: Not on file  . Number of children: Not on file  . Years of education: Not on file  . Highest education level: Not on file  Occupational History  . Occupation: Retired from [employer]  . Financial resource strain: Not on file  . Food insecurity:    Worry: Not on file    Inability: Not on file  . Transportation needs:    Medical: Not on file    Non-medical: Not on file  Tobacco Use  . Smoking status: Never Smoker  . Smokeless  tobacco: Never Used  Substance and Sexual Activity  . Alcohol use: No  . Drug use: No  . Sexual activity: Not on file  Lifestyle  . Physical activity:    Days per week: Not on file    Minutes per session: Not on file  . Stress: Not on file  Relationships  . Social connections:    Talks on phone: Not on file    Gets together: Not on file    Attends religious service: Not on file    Active member of club or organization: Not on file    Attends meetings of clubs or organizations: Not on file    Relationship status: Not on file  Other Topics Concern  . Not on file  Social History Narrative   Lives in [place] with husband. Drives, walks some, but not very active at home.      Family History: The patient's family history includes Cancer in her mother; Heart disease in her father and mother; Heart failure in her father and mother. ROS:   Please see the history of present illness.    All 14 point review of systems negative except as described per history of present illness  EKGs/Labs/Other Studies Reviewed:      Recent  Labs: 02/26/2018: BUN 15; Creatinine, Ser 0.80; Hemoglobin 14.9; Platelets 650; Potassium 3.8; Sodium 141  Recent Lipid Panel No results found for: CHOL, TRIG, HDL, CHOLHDL, VLDL, LDLCALC, LDLDIRECT  Physical Exam:    VS:  BP 118/82 (BP Location: Right Arm, Patient Position: Sitting, Cuff Size: Normal)   Pulse 84   Ht 5\' 7"  (1.702 m)   Wt 197 lb (89.4 kg)   SpO2 96%   BMI 30.85 kg/m     Wt Readings from Last 3 Encounters:  05/06/18 197 lb (89.4 kg)  03/03/18 195 lb 6 oz (88.6 kg)  02/26/18 195 lb 6 oz (88.6 kg)     GEN:  Well nourished, well developed in no acute distress HEENT: Normal NECK: No JVD; No carotid bruits LYMPHATICS: No lymphadenopathy CARDIAC: RRR, no murmurs, no rubs, no gallops RESPIRATORY:  Clear to auscultation without rales, wheezing or rhonchi  ABDOMEN: Soft, non-tender, non-distended MUSCULOSKELETAL:  No edema; No deformity  SKIN: Warm and dry LOWER EXTREMITIES: no swelling NEUROLOGIC:  Alert and oriented x 3 PSYCHIATRIC:  Normal affect   ASSESSMENT:    1. Paroxysmal atrial fibrillation (HCC)   2. Atrial fibrillation with rapid ventricular response ([place])   3. Chronic diastolic congestive heart failure ([place])   4. Coronary artery disease involving native coronary artery of native heart without angina pectoris    PLAN:    In order of problems listed above:  1. Paroxysmal atrial fibrillation doing well anticoagulated on flecainide EKG looks normal no dizziness passing on 2. Chronic diastolic congestive heart rate stable on appropriate medications which I will continue 3. Coronary artery disease stable without any symptoms.   Medication Adjustments/Labs and Tests Ordered: Current medicines are reviewed at length with the patient today.  Concerns regarding medicines are outlined above.  No orders of the defined types were placed in this encounter.  Medication changes: No orders of the defined types were placed in this encounter.   Signed, Park Liter, MD, [facility] 05/06/2018 1:42 PM    [facility]

## 2018-05-11 ENCOUNTER — Ambulatory Visit: Payer: Medicare Other | Admitting: Cardiology

## 2018-07-08 ENCOUNTER — Other Ambulatory Visit: Payer: Self-pay | Admitting: Emergency Medicine

## 2018-07-08 MED ORDER — APIXABAN 5 MG PO TABS: 5.0000 mg | ORAL_TABLET | Freq: Two times a day (BID) | ORAL | 3 refills | Status: DC

## 2018-07-08 NOTE — Telephone Encounter (Signed)
Eliquis 5 mg tablet refilled.

## 2018-07-30 DIAGNOSIS — Z4689 Encounter for fitting and adjustment of other specified devices: Secondary | ICD-10-CM | POA: Diagnosis not present

## 2018-07-30 DIAGNOSIS — N8111 Cystocele, midline: Secondary | ICD-10-CM | POA: Diagnosis not present

## 2018-08-09 DIAGNOSIS — D473 Essential (hemorrhagic) thrombocythemia: Secondary | ICD-10-CM | POA: Diagnosis not present

## 2018-08-09 DIAGNOSIS — Z862 Personal history of diseases of the blood and blood-forming organs and certain disorders involving the immune mechanism: Secondary | ICD-10-CM | POA: Diagnosis not present

## 2018-08-12 DIAGNOSIS — Z23 Encounter for immunization: Secondary | ICD-10-CM | POA: Diagnosis not present

## 2018-08-30 ENCOUNTER — Other Ambulatory Visit: Payer: Self-pay | Admitting: Urology

## 2018-08-30 DIAGNOSIS — N3020 Other chronic cystitis without hematuria: Secondary | ICD-10-CM | POA: Diagnosis not present

## 2018-08-30 DIAGNOSIS — N200 Calculus of kidney: Secondary | ICD-10-CM | POA: Diagnosis not present

## 2018-08-30 DIAGNOSIS — N1330 Unspecified hydronephrosis: Secondary | ICD-10-CM | POA: Diagnosis not present

## 2018-09-16 ENCOUNTER — Other Ambulatory Visit: Payer: Self-pay

## 2018-09-16 ENCOUNTER — Encounter (HOSPITAL_BASED_OUTPATIENT_CLINIC_OR_DEPARTMENT_OTHER): Payer: Self-pay

## 2018-09-16 NOTE — Progress Notes (Signed)
Spoke with:  Carrissa NPO: No food after midnight/Clear liquids until 8:15AM DOS Arrival time: 1215PM Labs: Istat 8 (EKG on chart/in epic) AM medications:  Eliquis, Carvedilol, Diltiazem, Flecainide, Omeprazole Pre op orders: Yes Ride home:  [name] [name] (husband) [phone]

## 2018-09-22 ENCOUNTER — Ambulatory Visit (HOSPITAL_BASED_OUTPATIENT_CLINIC_OR_DEPARTMENT_OTHER)
Admission: RE | Admit: 2018-09-22 | Discharge: 2018-09-22 | Disposition: A | Discharge status: Home / Self Care | Payer: Medicare Other | Source: Ambulatory Visit | Attending: Urology | Admitting: Urology

## 2018-09-22 ENCOUNTER — Ambulatory Visit (HOSPITAL_BASED_OUTPATIENT_CLINIC_OR_DEPARTMENT_OTHER): Payer: Medicare Other | Admitting: Anesthesiology

## 2018-09-22 ENCOUNTER — Encounter (HOSPITAL_BASED_OUTPATIENT_CLINIC_OR_DEPARTMENT_OTHER)
Admission: RE | Disposition: A | Discharge status: Home / Self Care | Payer: Self-pay | Source: Ambulatory Visit | Attending: Urology

## 2018-09-22 ENCOUNTER — Encounter (HOSPITAL_BASED_OUTPATIENT_CLINIC_OR_DEPARTMENT_OTHER): Payer: Self-pay

## 2018-09-22 ENCOUNTER — Other Ambulatory Visit: Payer: Self-pay

## 2018-09-22 DIAGNOSIS — Z79899 Other long term (current) drug therapy: Secondary | ICD-10-CM | POA: Insufficient documentation

## 2018-09-22 DIAGNOSIS — N131 Hydronephrosis with ureteral stricture, not elsewhere classified: Secondary | ICD-10-CM | POA: Diagnosis not present

## 2018-09-22 DIAGNOSIS — E785 Hyperlipidemia, unspecified: Secondary | ICD-10-CM | POA: Diagnosis not present

## 2018-09-22 DIAGNOSIS — Z7901 Long term (current) use of anticoagulants: Secondary | ICD-10-CM | POA: Diagnosis not present

## 2018-09-22 DIAGNOSIS — I110 Hypertensive heart disease with heart failure: Secondary | ICD-10-CM | POA: Insufficient documentation

## 2018-09-22 DIAGNOSIS — I2510 Atherosclerotic heart disease of native coronary artery without angina pectoris: Secondary | ICD-10-CM | POA: Insufficient documentation

## 2018-09-22 DIAGNOSIS — I5032 Chronic diastolic (congestive) heart failure: Secondary | ICD-10-CM | POA: Insufficient documentation

## 2018-09-22 DIAGNOSIS — I480 Paroxysmal atrial fibrillation: Secondary | ICD-10-CM | POA: Diagnosis not present

## 2018-09-22 DIAGNOSIS — N135 Crossing vessel and stricture of ureter without hydronephrosis: Secondary | ICD-10-CM | POA: Diagnosis not present

## 2018-09-22 DIAGNOSIS — D649 Anemia, unspecified: Secondary | ICD-10-CM | POA: Diagnosis not present

## 2018-09-22 HISTORY — DX: Dependence on respirator (ventilator) status: Z99.11

## 2018-09-22 HISTORY — DX: Chronic diastolic (congestive) heart failure: I50.32

## 2018-09-22 HISTORY — DX: Personal history of peptic ulcer disease: Z87.11

## 2018-09-22 HISTORY — DX: Unspecified atrial fibrillation: I48.91

## 2018-09-22 HISTORY — DX: Personal history of colonic polyps: Z86.010

## 2018-09-22 HISTORY — DX: Atherosclerotic heart disease of native coronary artery without angina pectoris: I25.10

## 2018-09-22 HISTORY — DX: Personal history of colon polyps, unspecified: Z86.0100

## 2018-09-22 HISTORY — DX: Atherosclerosis of aorta: I70.0

## 2018-09-22 HISTORY — PX: CYSTOSCOPY W/ URETERAL STENT PLACEMENT: SHX1429

## 2018-09-22 HISTORY — DX: Unspecified hydronephrosis: N13.30

## 2018-09-22 HISTORY — DX: Dependence on renal dialysis: Z99.2

## 2018-09-22 HISTORY — DX: Diverticulosis of intestine, part unspecified, without perforation or abscess without bleeding: K57.90

## 2018-09-22 LAB — POCT I-STAT, CHEM 8
BUN: 18 mg/dL (ref 8–23)
Calcium, Ion: 1.23 mmol/L (ref 1.15–1.40)
Chloride: 105 mmol/L (ref 98–111)
Creatinine, Ser: 0.9 mg/dL (ref 0.44–1.00)
Glucose, Bld: 102 mg/dL — ABNORMAL HIGH (ref 70–99)
HCT: 45 % (ref 36.0–46.0)
Hemoglobin: 15.3 g/dL — ABNORMAL HIGH (ref 12.0–15.0)
Potassium: 4.2 mmol/L (ref 3.5–5.1)
Sodium: 140 mmol/L (ref 135–145)
TCO2: 24 mmol/L (ref 22–32)

## 2018-09-22 SURGERY — CYSTOSCOPY, WITH RETROGRADE PYELOGRAM AND URETERAL STENT INSERTION
Anesthesia: General | Site: Ureter | Laterality: Right

## 2018-09-22 MED ORDER — FENTANYL CITRATE (PF) 100 MCG/2ML IJ SOLN
INTRAMUSCULAR | Status: AC
  Filled 2018-09-22: qty 2

## 2018-09-22 MED ORDER — DEXAMETHASONE SODIUM PHOSPHATE 10 MG/ML IJ SOLN
INTRAMUSCULAR | Status: DC | PRN
  Administered 2018-09-22: 5 mg via INTRAVENOUS

## 2018-09-22 MED ORDER — IOHEXOL 300 MG/ML  SOLN
INTRAMUSCULAR | Status: DC | PRN
  Administered 2018-09-22: 9 mL via URETHRAL

## 2018-09-22 MED ORDER — PROPOFOL 10 MG/ML IV BOLUS
INTRAVENOUS | Status: AC
  Filled 2018-09-22: qty 40

## 2018-09-22 MED ORDER — LIDOCAINE 2% (20 MG/ML) 5 ML SYRINGE
INTRAMUSCULAR | Status: AC
  Filled 2018-09-22: qty 5

## 2018-09-22 MED ORDER — LIDOCAINE 2% (20 MG/ML) 5 ML SYRINGE
INTRAMUSCULAR | Status: DC | PRN
  Administered 2018-09-22: 60 mg via INTRAVENOUS

## 2018-09-22 MED ORDER — GENTAMICIN SULFATE 40 MG/ML IJ SOLN
5.0000 mg/kg | INTRAVENOUS | Status: AC
  Administered 2018-09-22: 360 mg via INTRAVENOUS
  Filled 2018-09-22: qty 9

## 2018-09-22 MED ORDER — ONDANSETRON HCL 4 MG/2ML IJ SOLN
INTRAMUSCULAR | Status: AC
  Filled 2018-09-22: qty 2

## 2018-09-22 MED ORDER — GENTAMICIN SULFATE 40 MG/ML IJ SOLN
5.0000 mg/kg | INTRAVENOUS | Status: DC
  Filled 2018-09-22: qty 11.25

## 2018-09-22 MED ORDER — ONDANSETRON HCL 4 MG/2ML IJ SOLN
INTRAMUSCULAR | Status: DC | PRN
  Administered 2018-09-22: 4 mg via INTRAVENOUS

## 2018-09-22 MED ORDER — DEXAMETHASONE SODIUM PHOSPHATE 10 MG/ML IJ SOLN
INTRAMUSCULAR | Status: AC
  Filled 2018-09-22: qty 1

## 2018-09-22 MED ORDER — LACTATED RINGERS IV SOLN
INTRAVENOUS | Status: DC
  Administered 2018-09-22: 1000 mL via INTRAVENOUS
  Filled 2018-09-22: qty 1000

## 2018-09-22 MED ORDER — FENTANYL CITRATE (PF) 100 MCG/2ML IJ SOLN
25.0000 ug | INTRAMUSCULAR | Status: DC | PRN
  Filled 2018-09-22: qty 1

## 2018-09-22 MED ORDER — PROPOFOL 10 MG/ML IV BOLUS
INTRAVENOUS | Status: DC | PRN
  Administered 2018-09-22: 50 mg via INTRAVENOUS
  Administered 2018-09-22: 150 mg via INTRAVENOUS

## 2018-09-22 SURGICAL SUPPLY — 23 items
BAG DRAIN URO-CYSTO SKYTR STRL (DRAIN) ×2 IMPLANT
BASKET LASER NITINOL 1.9FR (BASKET) IMPLANT
CATH INTERMIT  6FR 70CM (CATHETERS) IMPLANT
CLOTH BEACON ORANGE TIMEOUT ST (SAFETY) ×2 IMPLANT
FIBER LASER FLEXIVA 365 (UROLOGICAL SUPPLIES) IMPLANT
FIBER LASER TRAC TIP (UROLOGICAL SUPPLIES) IMPLANT
GLOVE BIO SURGEON STRL SZ7.5 (GLOVE) ×2 IMPLANT
GOWN STRL REUS W/TWL LRG LVL3 (GOWN DISPOSABLE) ×2 IMPLANT
GUIDEWIRE ANG ZIPWIRE 038X150 (WIRE) ×2 IMPLANT
GUIDEWIRE STR DUAL SENSOR (WIRE) ×2 IMPLANT
INFUSOR MANOMETER BAG 3000ML (MISCELLANEOUS) IMPLANT
IV NS 1000ML (IV SOLUTION) ×1
IV NS 1000ML BAXH (IV SOLUTION) ×1 IMPLANT
IV NS IRRIG 3000ML ARTHROMATIC (IV SOLUTION) ×2 IMPLANT
KIT TURNOVER CYSTO (KITS) ×2 IMPLANT
MANIFOLD NEPTUNE II (INSTRUMENTS) ×2 IMPLANT
NS IRRIG 500ML POUR BTL (IV SOLUTION) ×2 IMPLANT
PACK CYSTO (CUSTOM PROCEDURE TRAY) ×2 IMPLANT
STENT POLARIS LOOP 8FR X 24 CM (STENTS) ×2 IMPLANT
SYR 10ML LL (SYRINGE) ×2 IMPLANT
TUBE CONNECTING 12X1/4 (SUCTIONS) IMPLANT
TUBE FEEDING 8FR 16IN STR KANG (MISCELLANEOUS) IMPLANT
TUBING UROLOGY SET (TUBING) ×2 IMPLANT

## 2018-09-22 NOTE — Anesthesia Preprocedure Evaluation (Addendum)
Anesthesia Evaluation  Patient identified by MRN, date of birth, ID band Patient awake    Reviewed: Allergy & Precautions, NPO status , Patient's Chart, lab work & pertinent test results  Airway Mallampati: III  TM Distance: >3 FB Neck ROM: Full  Mouth opening: Limited Mouth Opening  Dental no notable dental hx. (+) Teeth Intact, Dental Advisory Given   Pulmonary neg pulmonary ROS,    Pulmonary exam normal breath sounds clear to auscultation       Cardiovascular hypertension, Pt. on medications and Pt. on home beta blockers + CAD, + Past MI and +CHF  Normal cardiovascular exam+ dysrhythmias Atrial Fibrillation  Rhythm:Regular Rate:Normal  TTE 2018 EF 55-60%, no valvular abnormalities   Neuro/Psych negative neurological ROS  negative psych ROS   GI/Hepatic Neg liver ROS, GERD  Medicated and Controlled,  Endo/Other  negative endocrine ROS  Renal/GU   negative genitourinary   Musculoskeletal negative musculoskeletal ROS (+)   Abdominal   Peds negative pediatric ROS (+)  Hematology  (+) Blood dyscrasia, anemia ,   Anesthesia Other Findings   Reproductive/Obstetrics negative OB ROS                           Anesthesia Physical Anesthesia Plan  ASA: III  Anesthesia Plan: General   Post-op Pain Management:    Induction: Intravenous  PONV Risk Score and Plan: 3 and Ondansetron, Dexamethasone and Treatment may vary due to age or medical condition  Airway Management Planned: LMA  Additional Equipment:   Intra-op Plan:   Post-operative Plan: Extubation in OR  Informed Consent: I have reviewed the patients History and Physical, chart, labs and discussed the procedure including the risks, benefits and alternatives for the proposed anesthesia with the patient or authorized representative who has indicated his/her understanding and acceptance.   Dental advisory given  Plan Discussed with:  CRNA  Anesthesia Plan Comments:         Anesthesia Quick Evaluation

## 2018-09-22 NOTE — Anesthesia Procedure Notes (Signed)
Procedure Name: LMA Insertion Date/Time: 09/22/2018 3:00 PM Performed by: Wanita Chamberlain, CRNA Pre-anesthesia Checklist: Patient identified, Timeout performed, Emergency Drugs available, Suction available and Patient being monitored Patient Re-evaluated:Patient Re-evaluated prior to induction Oxygen Delivery Method: Circle system utilized Preoxygenation: Pre-oxygenation with 100% oxygen Induction Type: IV induction Ventilation: Mask ventilation without difficulty LMA: LMA inserted LMA Size: 4.0 Number of attempts: 1 Placement Confirmation: CO2 detector,  positive ETCO2 and breath sounds checked- equal and bilateral Tube secured with: Tape Dental Injury: Teeth and Oropharynx as per pre-operative assessment

## 2018-09-22 NOTE — Discharge Instructions (Signed)
1 - You may have urinary urgency (bladder spasms) and bloody urine on / off with stent in place. This is normal.  2 - Call MD or go to ER for fever >102, severe pain / nausea / vomiting not relieved by medications, or acute change in medical status   CYSTOSCOPY HOME CARE INSTRUCTIONS  Activity: Rest for the remainder of the day.  Do not drive or operate equipment today.  You may resume normal activities in one to two days as instructed by your physician.   Meals: Drink plenty of liquids and eat light foods such as gelatin or soup this evening.  You may return to a normal meal plan tomorrow.   Special Instructions / Symptoms: Call your physician if any of these symptoms occur:   -persistent or heavy bleeding  -bleeding which continues after first few urination  -large blood clots that are difficult to pass  -urine stream diminishes or stops completely  -fever equal to or higher than 101 degrees Farenheit.  -cloudy urine with a strong, foul odor  -severe pain  Post Anesthesia Home Care Instructions  Activity: Get plenty of rest for the remainder of the day. A responsible individual must stay with you for 24 hours following the procedure.  For the next 24 hours, DO NOT: -Drive a car -Paediatric nurse -Drink alcoholic beverages -Take any medication unless instructed by your physician -Make any legal decisions or sign important papers.  Meals: Start with liquid foods such as gelatin or soup. Progress to regular foods as tolerated. Avoid greasy, spicy, heavy foods. If nausea and/or vomiting occur, drink only clear liquids until the nausea and/or vomiting subsides. Call your physician if vomiting continues.  Special Instructions/Symptoms: Your throat may feel dry or sore from the anesthesia or the breathing tube placed in your throat during surgery. If this causes discomfort, gargle with warm salt water. The discomfort should disappear within 24 hours.  If you had a scopolamine  patch placed behind your ear for the management of post- operative nausea and/or vomiting:  1. The medication in the patch is effective for 72 hours, after which it should be removed.  Wrap patch in a tissue and discard in the trash. Wash hands thoroughly with soap and water. 2. You may remove the patch earlier than 72 hours if you experience unpleasant side effects which may include dry mouth, dizziness or visual disturbances. 3. Avoid touching the patch. Wash your hands with soap and water after contact with the patch.

## 2018-09-22 NOTE — H&P (Signed)
[name] [name] is an 77 y.o. female.    Chief Complaint: Pre-op RIGHT Ureteral Stent Change  HPI:   1 - Rt Chronic Hydronephrosis / Partial Ureteral Stricture- Multiple prior stone procedures on right including SWL, ureteroscopy, and neph tube. Concern for high grade distal stricure 05/2013 during episode of urosepsis treated with neph tube by antegrade nephrostogram. Formal diagnostic ureteroscopy 02/2014 with several areas of relative narrowing (distal and proximal) but NO frank stricture (accomodated 16F sheath to UPJ).    Recent Course:    04/2016 Contrast CT - mod Rt hydro to just below iliacs, stable Lt non-complex cysts --> 7x24 JJ stent placed. 1 artery/ 1 vein Rt renovascular anatomy.   05/2016 - Renogram Rt 42% / Lt 58% relative function   10/2016 - Exchange Rt stent 7x24 polaris; 04/2017 Exchange Rt stent 7x24 polaris; 09/2017 Exchange Rt stent 7x24 plaris   02/2018 - Exchange Rt 8x24 polaris stent;    2 - Recurrent Nephrolithiasis - Prior composition CaPO4, CaOx   Pre 2014 - URS x several, open ureterolithotomy on Left, SWL on right x2  04/2016 - CT stone free    3 - Recurrent Urinary Tract Infection - Several episodes of simple cystitis, one febrile / pyelo episode 05/2013 as per above from e. coli sens to keflex, gent, but resistnat to others (cipro, bactrim). Similar febrile pyelo 04/2016 e. coli Res bactrim / cipro / amp but Sens keflex, gent, nitro, rocephin. Pelvic 02/2014 atrophic vaginitis, reduced prolapse with well-sized pessary. PVR 02/2014 "64mL" UCX 2019 e. coli sens keflex, gent, nitro.    4 - Atrophic Vaginitis - Post-meanausal. NO h/o hormone sensitive malignancy. Atrophy noted on eval for recurrent UTI 2015 and placed on topical premarin. She has pessary cleaned Q65mo per GYN.     PMH sig for AFib / Thrombocytosis / Eliquus (primary prevention only, cardioversion 2018) , lap chole, hyst, left open ureterolithotomy, ureteroscopy x several, shockwave lithotripsy.     Today [name] is seen to proceed with RIGHT ureteral stent exchange. NO interval fevers. Most recetn UCX non-clonal.     Past Medical History:  Diagnosis Date  . Abdominal aortic atherosclerosis ([place]) 05/07/2016   Noted on CT abd/pelvis  . Anticoagulant long-term use    ELIQUIS  . Atrial fibrillation with RVR ([place])   . Chronic diastolic (congestive) heart failure ([place])   . Coronary artery disease   . Cystocele, midline    followed by dr c. Marvel Plan ([facility] [place])  pt uses pessary  . Diverticulosis 05/07/2016   Noted on CT abd/pelvis  . Dyspnea    w exertion d/t Afib per pt  . First degree heart block   . GERD (gastroesophageal reflux disease)   . Hematuria    intermittently d/t JJ stent  . Hiatal hernia   . History of arteriovenostomy for renal dialysis ([place])   . History of colon polyps   . History of gastric polyp   . History of kidney stones   . History of peptic ulcer disease   . History of septic shock    2011 (pt unaware) and 06-04-2013  w/ acute renal failure  . History of uterine cancer    STAGE I  --  S/P TAH W/ BSO  (NO OTHER TX)  . History of ventilator dependency ([place]) 2014  . Hydronephrosis, right 05/07/2016   Moderate, Noted on CT abd/pelvis  . Hyperlipidemia   . Hypertension   . Iron deficiency anemia hematology/ oncology--- dr Bobby Rumpf at  [facility] in [place]--- per lov note 08-07-2017  stabilized and felt to be more chronic anemia   01/ 2018  dx iron def. anemia  s/p  IV Iron infusion and taking oral iron supplement  . PAF (paroxysmal atrial fibrillation) ([place]) DX JUNE 2012   CARDIOLOGIST--  DR Agustin Cree ([facility] , [place]--- that office has changed to [facility]))  CHADS2  . Presence of pessary    for midline pessary  . Ureteral stricture, right    has JJ stent  . Wears glasses     Past Surgical History:  Procedure Laterality Date  . CARDIOVERSION  09/ 2018   dr  Agustin Cree   successful ([facility] )  . CHOLECYSTECTOMY  2010  . COLONOSCOPY W/ POLYPECTOMY  05/2017  . CYSTO/  BALLOON DILATION RIGHT URETERAL STRICTURE/ STENT PLACEMENT  06-02-2013  . CYSTO/  BILATERAL RETROGRADE PYELOGRAM/ RIGHT URETEROSCOPY AND STENT PLACEMENT  10-04-2005  . CYSTO/ LEFT URETEROSCOPIC STONE EXTRACTION  04-03-2006  . CYSTOSCOPY W/ URETERAL STENT PLACEMENT Right 02/06/2014   Procedure: CYSTOSCOPY WITH RIGHT RETROGRADE PYELOGRAM RIGHT STENT REMOVAL and REPLACEMENT, Right Ureteroscopy;  Surgeon: Ailene Rud, MD;  Location: [facility];  Service: Urology;  Laterality: Right;  . CYSTOSCOPY W/ URETERAL STENT PLACEMENT Right 11/26/2016   Procedure: CYSTOSCOPY WITH RETROGRADE PYELOGRAM/URETERAL STENT REPLACEMENT;  Surgeon: Alexis Frock, MD;  Location: [facility];  Service: Urology;  Laterality: Right;  . CYSTOSCOPY W/ URETERAL STENT PLACEMENT Right 04/24/2017   Procedure: CYSTOSCOPY WITH RETROGRADE PYELOGRAM/URETERAL STENT REPLACEMENT;  Surgeon: Alexis Frock, MD;  Location: [facility];  Service: Urology;  Laterality: Right;  . CYSTOSCOPY W/ URETERAL STENT PLACEMENT Right 10/07/2017   Procedure: CYSTOSCOPY WITH RETROGRADE PYELOGRAM/URETERAL STENT EXCHANGE;  Surgeon: Alexis Frock, MD;  Location: [facility];  Service: Urology;  Laterality: Right;  . CYSTOSCOPY W/ URETERAL STENT PLACEMENT Right 03/03/2018   Procedure: CYSTOSCOPY WITH RIGHT RETROGRADE / RIGHT URETERAL STENT EXCHANGE;  Surgeon: Alexis Frock, MD;  Location: [facility];  Service: Urology;  Laterality: Right;  . CYSTOSCOPY WITH LITHOLAPAXY N/A 11/21/2013   Procedure: CYSTOSCOPY WITH LITHOLAPAXY;  Surgeon: Ailene Rud, MD;  Location: [facility];  Service: Urology;  Laterality: N/A;  . CYSTOSCOPY WITH RETROGRADE PYELOGRAM, URETEROSCOPY AND STENT PLACEMENT Bilateral 03/15/2014   Procedure: CYSTOSCOPY WITH BILATERAL RETROGRADE PYELOGRAM, RIGHT  DIAGNOSTIC URETEROSCOPY AND STENT EXCHANGE;  Surgeon: Alexis Frock, MD;  Location: [facility];  Service: Urology;  Laterality: Bilateral;  . CYSTOSCOPY WITH STENT PLACEMENT Right 05/07/2016   Procedure: CYSTOSCOPY WITH RETROGRADE PYELOGRAM, URETERAL STENT PLACEMENT;  Surgeon: Franchot Gallo, MD;  Location: [facility];  Service: Urology;  Laterality: Right;  . HEMIARTHROPLASTY HIP Right 11/2016   fractured  . JOINT REPLACEMENT Right 11/2016   Hip Replacement  . LUMBAR FUSION  JULY 2013  . NEPHROLITHOTOMY  X2 YRS AGO  . PERCUTANEOUS NEPHROSTOMY  2014  . TOTAL ABDOMINAL HYSTERECTOMY W/ BILATERAL SALPINGOOPHORECTOMY  1989  . TOTAL KNEE ARTHROPLASTY Bilateral 1992  &  1996  . TRANSTHORACIC ECHOCARDIOGRAM  07-24-2017   dr Agustin Cree   ef 60-65% (improved from last echo 2014, was 45-50%)/  trace TR/  mild AV sclerosis without stenosis  . URETEROSCOPY Right 11/21/2013   Procedure: URETEROSCOPY WITH STENT REMOVAL AND REPLACEMENT;  Surgeon: Ailene Rud, MD;  Location[facility];  Service: Urology;  Laterality: Right;    Family History  Problem Relation Age of Onset  . Heart disease Mother   . Heart  failure Mother   . Cancer Mother   . Heart disease Father   . Heart failure Father    Social History:  reports that she has never smoked. She has never used smokeless tobacco. She reports that she does not drink alcohol or use drugs.  Allergies:  Allergies  Allergen Reactions  . Codeine Nausea Only and Other (See Comments)    hallucinations  . Hydrocodone Nausea Only and Other (See Comments)  . Macrodantin [Nitrofurantoin] Nausea And Vomiting    Medications Prior to Admission  Medication Sig Dispense Refill  . acetaminophen (TYLENOL) 500 MG tablet Take 1,000 mg by mouth 2 (two) times daily as needed for moderate pain or headache.    [name] amitriptyline (ELAVIL) 25 MG tablet Take 25 mg by mouth at bedtime.    [name] apixaban (ELIQUIS) 5 MG TABS tablet Take 1 tablet (5 mg total) by  mouth 2 (two) times daily. 90 tablet 3  . carvedilol (COREG) 6.25 MG tablet TAKE 1/2 TABLET BY MOUTH TWICE DAILY 180 tablet 1  . cephALEXin (KEFLEX) 500 MG capsule Take 1 capsule (500 mg total) by mouth 2 (two) times daily. X 3 days for urinary bacteria 6 capsule 0  . diltiazem (CARDIZEM CD) 120 MG 24 hr capsule Take 1 capsule (120 mg total) by mouth every morning. (Patient taking differently: Take 120 mg by mouth every morning. ) 90 capsule 3  . ferrous sulfate 325 (65 FE) MG EC tablet Take 325 mg by mouth 2 (two) times daily.     . flecainide (TAMBOCOR) 50 MG tablet Take 1.5 tablets (75 mg total) by mouth 2 (two) times daily. 270 tablet 1  . furosemide (LASIX) 40 MG tablet Take 40 mg by mouth daily as needed for fluid.     [name] omeprazole (PRILOSEC) 20 MG capsule Take 20 mg by mouth every morning.     . potassium chloride (K-DUR) 10 MEQ tablet Take 1 tablet (10 mEq total) by mouth as needed. When you take Lasix. (Patient taking differently: Take 10 mEq by mouth as needed (take when taking lasix prn). When you take Lasix.) 30 tablet 6    No results found for this or any previous visit (from the past 48 hour(s)). No results found.  Review of Systems  Constitutional: Negative.  Negative for chills and fever.  HENT: Negative.   Eyes: Negative.   Respiratory: Negative.   Cardiovascular: Negative.   Gastrointestinal: Negative.   Genitourinary: Positive for urgency.  Musculoskeletal: Negative.   Skin: Negative.   Neurological: Negative.   Endo/Heme/Allergies: Negative.   Psychiatric/Behavioral: Negative.     Height 5\' 7"  (1.702 m), weight 87.1 kg. Physical Exam  Constitutional: She appears well-developed.  HENT:  Head: Normocephalic.  Neck: Normal range of motion.  Cardiovascular: Normal rate.  Respiratory: Effort normal.  GI:  Stable truncal obesity.   Genitourinary:  Genitourinary Comments: NO CVAT  Musculoskeletal: Normal range of motion.  Neurological: She is alert.  Skin: Skin  is warm.     Assessment/Plan  Proceed as planned with RIGHT ureteral stent change. RIsks, benefits, expected peri-op course discussed previously and reiterated today.   Alexis Frock, MD 09/22/2018, 11:50 AM

## 2018-09-22 NOTE — Transfer of Care (Signed)
Immediate Anesthesia Transfer of Care Note  Patient: [name] [name]  Procedure(s) Performed: CYSTOSCOPY WITH RETROGRADE PYELOGRAM/URETERAL STENT PLACEMENT (Right Ureter)  Patient Location: PACU  Anesthesia Type:General  Level of Consciousness: awake, alert , oriented and patient cooperative  Airway & Oxygen Therapy: Patient Spontanous Breathing and Patient connected to nasal cannula oxygen  Post-op Assessment: Report given to RN and Post -op Vital signs reviewed and stable  Post vital signs: Reviewed and stable  Last Vitals:  Vitals Value Taken Time  BP 130/65 09/22/2018  3:23 PM  Temp 36.6 C 09/22/2018  3:23 PM  Pulse 74 09/22/2018  3:25 PM  Resp 15 09/22/2018  3:25 PM  SpO2 98 % 09/22/2018  3:25 PM  Vitals shown include unvalidated device data.  Last Pain:  Vitals:   09/22/18 1204  TempSrc: Oral  PainSc: 1       Patients Stated Pain Goal: 10 (24/11/46 4314)  Complications: No apparent anesthesia complications

## 2018-09-22 NOTE — Brief Op Note (Signed)
09/22/2018  3:18 PM  PATIENT:  [name] [name]  77 y.o. female  PRE-OPERATIVE DIAGNOSIS:  RIGHT URETERAL STRICTURE  POST-OPERATIVE DIAGNOSIS:  RIGHT URETERAL STRICTURE  PROCEDURE:  Procedure(s) with comments: CYSTOSCOPY WITH RETROGRADE PYELOGRAM/URETERAL STENT PLACEMENT (Right) - 45 MINS  SURGEON:  Surgeon(s) and Role:    * Alexis Frock, MD - Primary  PHYSICIAN ASSISTANT:   ASSISTANTS: none   ANESTHESIA:   general  EBL:  minimal   BLOOD ADMINISTERED:none  DRAINS: none   LOCAL MEDICATIONS USED:  NONE  SPECIMEN:  No Specimen  DISPOSITION OF SPECIMEN:  N/A  COUNTS:  YES  TOURNIQUET:  * No tourniquets in log *  DICTATION: .Other Dictation: Dictation Number [id number]  PLAN OF CARE: Discharge to home after PACU  PATIENT DISPOSITION:  PACU - hemodynamically stable.   Delay start of Pharmacological VTE agent (>24hrs) due to surgical blood loss or risk of bleeding: yes

## 2018-09-23 NOTE — Op Note (Signed)
[name] [name], [facility] MEDICAL RECORD RT:[id number] ACCOUNT [id number] DATE OF BIRTH:Mar 23, 1941 FACILITY: [facility] LOCATION: [facility] PHYSICIAN:Mallerie Blok, MD  OPERATIVE REPORT  DATE OF PROCEDURE:  09/22/2018  PREOPERATIVE DIAGNOSIS:  Chronic right ureteral stricture.  PROCEDURE: 1.  Cystoscopy with right retrograde pyelogram interpretation. 2.  Exchange of right ureteral stent 8 x 24 Polaris, no tether.  ESTIMATED BLOOD LOSS:  Nil.  COMPLICATIONS:  None.  SPECIMEN:  Right stent inspected and intact, discarded.  OPERATIVE FINDINGS: 1.  Persistent right mid distal ureteral stricture.  No evidence of hydronephrosis. 2.  Successful replacement of right ureteral stent proximal renal pelvis, distally in urinary bladder.  INDICATIONS:  The patient is a pleasant but very comorbid 77 year old lady with history of a chronic right ureteral stricture.  She is managed with chronic stent changes q. 6 months.  She has done well with this for a number of years.  She presents for a  stent exchange today.  Informed consent was signed and placed in the medical record.  PROCEDURE IN DETAIL:  The patient being identified, procedure being right ureteral stent exchange was confirmed.  Procedure timeout was performed.  Intravenous antibiotics administered.  General LMA anesthesia induced.  The patient was placed into a low  lithotomy position.  A sterile field was created by prepping and draping the patient's vagina, introitus and proximal thighs using iodine.  Cystourethroscopy was performed using a 22-French rigid cystoscope with offset lens.  Inspection of bladder with  no diverticula, calcifications, or papillary lesions.  The distal and the right ureteral stent were seen in situ.  There was minimal encrustation.  It was grasped and brought out in its entirety and inspected and intact and set aside for discard.  The  right ureteral orifice was then cannulated with a 6-French renal catheter and  right retrograde pyelogram was obtained.  Right retrograde pyelogram demonstrated a single right ureter, a single-system right kidney.  There was a significant fairly long segment right mid and distal ureteral stricture as per prior.  A 0.038 sensor wire was advanced to the lower pole and a new  8 x 24 Polaris-type stent was placed using cystoscopic and fluoroscopic guidance.  Good proximal and distal points were noted, and the procedure was terminated.  The patient tolerated the procedure well.  No immediate perinatal complications.  The  patient was taken to postanesthesia care unit in stable condition.  LN/NUANCE  D:09/22/2018 T:09/23/2018 JOB:[id number]

## 2018-09-23 NOTE — Anesthesia Postprocedure Evaluation (Signed)
Anesthesia Post Note  Patient: [name] [name]  Procedure(s) Performed: CYSTOSCOPY WITH RETROGRADE PYELOGRAM/URETERAL STENT PLACEMENT (Right Ureter)     Patient location during evaluation: PACU Anesthesia Type: General Level of consciousness: awake and alert Pain management: pain level controlled Vital Signs Assessment: post-procedure vital signs reviewed and stable Respiratory status: spontaneous breathing, nonlabored ventilation, respiratory function stable and patient connected to nasal cannula oxygen Cardiovascular status: blood pressure returned to baseline and stable Postop Assessment: no apparent nausea or vomiting Anesthetic complications: no    Last Vitals:  Vitals:   09/22/18 1540 09/22/18 1600  BP: 118/88 135/63  Pulse: 71 62  Resp: 19 14  Temp:  36.6 C  SpO2: 100% 98%    Last Pain:  Vitals:   09/22/18 1600  TempSrc:   PainSc: 0-No pain                 Aryona Sill P Sufyan Meidinger

## 2018-09-27 ENCOUNTER — Encounter (HOSPITAL_BASED_OUTPATIENT_CLINIC_OR_DEPARTMENT_OTHER): Payer: Self-pay | Admitting: Urology

## 2018-10-05 ENCOUNTER — Encounter: Payer: Self-pay | Admitting: Cardiology

## 2018-10-05 ENCOUNTER — Ambulatory Visit (INDEPENDENT_AMBULATORY_CARE_PROVIDER_SITE_OTHER): Payer: Medicare Other | Admitting: Cardiology

## 2018-10-05 VITALS — BP 130/68 | HR 79 | Ht 67.0 in | Wt 194.4 lb

## 2018-10-05 DIAGNOSIS — I5032 Chronic diastolic (congestive) heart failure: Secondary | ICD-10-CM | POA: Diagnosis not present

## 2018-10-05 DIAGNOSIS — I480 Paroxysmal atrial fibrillation: Secondary | ICD-10-CM

## 2018-10-05 DIAGNOSIS — E782 Mixed hyperlipidemia: Secondary | ICD-10-CM | POA: Diagnosis not present

## 2018-10-05 NOTE — Patient Instructions (Signed)
Medication Instructions:  Your physician recommends that you continue on your current medications as directed. Please refer to the Current Medication list given to you today.  If you need a refill on your cardiac medications before your next appointment, please call your pharmacy.   Lab work: None.  If you have labs (blood work) drawn today and your tests are completely normal, you will receive your results only by: . MyChart Message (if you have MyChart) OR . A paper copy in the mail If you have any lab test that is abnormal or we need to change your treatment, we will call you to review the results.  Testing/Procedures: None.    Follow-Up: At [facility], you and your health needs are our priority.  As part of our continuing mission to provide you with exceptional heart care, we have created designated Provider Care Teams.  These Care Teams include your primary Cardiologist (physician) and Advanced Practice Providers (APPs -  Physician Assistants and Nurse Practitioners) who all work together to provide you with the care you need, when you need it. You will need a follow up appointment in 4 months.  Please call our office 2 months in advance to schedule this appointment.  You may see No primary care provider on file. or another member of our [facility] Provider Team in [place]: Brian Munley, MD . Rajan Revankar, MD  Any Other Special Instructions Will Be Listed Below (If Applicable).     

## 2018-10-05 NOTE — Progress Notes (Signed)
Cardiology Office Note:    Date:  10/05/2018   ID:  [name] [name], DOB 1941-07-11, MRN [id number]  PCP:  [name], MD  Cardiologist:  Jenne Campus, MD    Referring MD: [name], MD   Chief Complaint  Patient presents with  . Follow-up  Doing well  History of Present Illness:    [name] [name] is a 77 y.o. female with paroxysmal atrial fibrillation successfully controlled with flecainide.  Also anticoagulated with Eliquis.  Overall doing well denies having any recent episodes of palpitations EKG showed normal sinus rhythm without evidence of atrial fibrillation.  Overall she is doing well recently she got a stent replaced in her urinary tract doing well.  Past Medical History:  Diagnosis Date  . Abdominal aortic atherosclerosis ([place]) 05/07/2016   Noted on CT abd/pelvis  . Anticoagulant long-term use    ELIQUIS  . Atrial fibrillation with RVR ([place])   . Chronic diastolic (congestive) heart failure ([place])   . Coronary artery disease   . Cystocele, midline    followed by dr c. Marvel Plan ([facility] [place])  pt uses pessary  . Diverticulosis 05/07/2016   Noted on CT abd/pelvis  . Dyspnea    w exertion d/t Afib per pt  . First degree heart block   . GERD (gastroesophageal reflux disease)   . Hematuria    intermittently d/t JJ stent  . Hiatal hernia   . History of arteriovenostomy for renal dialysis ([place])   . History of colon polyps   . History of gastric polyp   . History of kidney stones   . History of peptic ulcer disease   . History of septic shock    2011 (pt unaware) and 06-04-2013  w/ acute renal failure  . History of uterine cancer    STAGE I  --  S/P TAH W/ BSO  (NO OTHER TX)  . History of ventilator dependency ([place]) 2014  . Hydronephrosis, right 05/07/2016   Moderate, Noted on CT abd/pelvis  . Hyperlipidemia   . Hypertension   . Iron deficiency anemia hematology/ oncology--- dr Bobby Rumpf at [facility] in  [place]--- per lov note 08-07-2017  stabilized and felt to be more chronic anemia   01/ 2018  dx iron def. anemia  s/p  IV Iron infusion and taking oral iron supplement  . PAF (paroxysmal atrial fibrillation) ([place]) DX JUNE 2012   CARDIOLOGIST--  DR Agustin Cree ([facility] [place]--- that office has changed to [facility]))  CHADS2  . Presence of pessary    for midline pessary  . Ureteral stricture, right    has JJ stent  . Wears glasses     Past Surgical History:  Procedure Laterality Date  . CARDIOVERSION  09/ 2018   dr Agustin Cree   successful ([facility] )  . CHOLECYSTECTOMY  2010  . COLONOSCOPY W/ POLYPECTOMY  05/2017  . CYSTO/  BALLOON DILATION RIGHT URETERAL STRICTURE/ STENT PLACEMENT  06-02-2013  . CYSTO/  BILATERAL RETROGRADE PYELOGRAM/ RIGHT URETEROSCOPY AND STENT PLACEMENT  10-04-2005  . CYSTO/ LEFT URETEROSCOPIC STONE EXTRACTION  04-03-2006  . CYSTOSCOPY W/ URETERAL STENT PLACEMENT Right 02/06/2014   Procedure: CYSTOSCOPY WITH RIGHT RETROGRADE PYELOGRAM RIGHT STENT REMOVAL and REPLACEMENT, Right Ureteroscopy;  Surgeon: Ailene Rud, MD;  Location: [facility];  Service: Urology;  Laterality: Right;  . CYSTOSCOPY W/ URETERAL STENT PLACEMENT Right 11/26/2016   Procedure: CYSTOSCOPY WITH RETROGRADE PYELOGRAM/URETERAL STENT REPLACEMENT;  Surgeon: Alexis Frock,  MD;  Location: [facility];  Service: Urology;  Laterality: Right;  . CYSTOSCOPY W/ URETERAL STENT PLACEMENT Right 04/24/2017   Procedure: CYSTOSCOPY WITH RETROGRADE PYELOGRAM/URETERAL STENT REPLACEMENT;  Surgeon: Alexis Frock, MD;  Location: [facility];  Service: Urology;  Laterality: Right;  . CYSTOSCOPY W/ URETERAL STENT PLACEMENT Right 10/07/2017   Procedure: CYSTOSCOPY WITH RETROGRADE PYELOGRAM/URETERAL STENT EXCHANGE;  Surgeon: Alexis Frock, MD;  Location: [facility];  Service: Urology;  Laterality: Right;  . CYSTOSCOPY W/  URETERAL STENT PLACEMENT Right 03/03/2018   Procedure: CYSTOSCOPY WITH RIGHT RETROGRADE / RIGHT URETERAL STENT EXCHANGE;  Surgeon: Alexis Frock, MD;  Location: [facility];  Service: Urology;  Laterality: Right;  . CYSTOSCOPY W/ URETERAL STENT PLACEMENT Right 09/22/2018   Procedure: CYSTOSCOPY WITH RETROGRADE PYELOGRAM/URETERAL STENT PLACEMENT;  Surgeon: Alexis Frock, MD;  Location: [facility];  Service: Urology;  Laterality: Right;  45 MINS  . CYSTOSCOPY WITH LITHOLAPAXY N/A 11/21/2013   Procedure: CYSTOSCOPY WITH LITHOLAPAXY;  Surgeon: Ailene Rud, MD;  Location: [facility];  Service: Urology;  Laterality: N/A;  . CYSTOSCOPY WITH RETROGRADE PYELOGRAM, URETEROSCOPY AND STENT PLACEMENT Bilateral 03/15/2014   Procedure: CYSTOSCOPY WITH BILATERAL RETROGRADE PYELOGRAM, RIGHT DIAGNOSTIC URETEROSCOPY AND STENT EXCHANGE;  Surgeon: Alexis Frock, MD;  Location: [facility];  Service: Urology;  Laterality: Bilateral;  . CYSTOSCOPY WITH STENT PLACEMENT Right 05/07/2016   Procedure: CYSTOSCOPY WITH RETROGRADE PYELOGRAM, URETERAL STENT PLACEMENT;  Surgeon: Franchot Gallo, MD;  Location: [facility];  Service: Urology;  Laterality: Right;  . HEMIARTHROPLASTY HIP Right 11/2016   fractured  . JOINT REPLACEMENT Right 11/2016   Hip Replacement  . LUMBAR FUSION  JULY 2013  . NEPHROLITHOTOMY  X2 YRS AGO  . PERCUTANEOUS NEPHROSTOMY  2014  . TOTAL ABDOMINAL HYSTERECTOMY W/ BILATERAL SALPINGOOPHORECTOMY  1989  . TOTAL KNEE ARTHROPLASTY Bilateral 1992  &  1996  . TRANSTHORACIC ECHOCARDIOGRAM  07-24-2017   dr Agustin Cree   ef 60-65% (improved from last echo 2014, was 45-50%)/  trace TR/  mild AV sclerosis without stenosis  . URETEROSCOPY Right 11/21/2013   Procedure: URETEROSCOPY WITH STENT REMOVAL AND REPLACEMENT;  Surgeon: Ailene Rud, MD;  Location: [facility];  Service: Urology;  Laterality: Right;    Current Medications: Current Meds  Medication Sig  .  acetaminophen (TYLENOL) 500 MG tablet Take 1,000 mg by mouth 2 (two) times daily as needed for moderate pain or headache.  [name] amitriptyline (ELAVIL) 25 MG tablet Take 25 mg by mouth at bedtime.  [name] apixaban (ELIQUIS) 5 MG TABS tablet Take 1 tablet (5 mg total) by mouth 2 (two) times daily.  . carvedilol (COREG) 6.25 MG tablet TAKE 1/2 TABLET BY MOUTH TWICE DAILY  . diltiazem (CARDIZEM CD) 120 MG 24 hr capsule Take 1 capsule (120 mg total) by mouth every morning. (Patient taking differently: Take 120 mg by mouth every morning. )  . ferrous sulfate 325 (65 FE) MG EC tablet Take 325 mg by mouth 2 (two) times daily.   . flecainide (TAMBOCOR) 50 MG tablet Take 1.5 tablets (75 mg total) by mouth 2 (two) times daily.  . furosemide (LASIX) 40 MG tablet Take 40 mg by mouth daily as needed for fluid.   [name] omeprazole (PRILOSEC) 20 MG capsule Take 20 mg by mouth every morning.   . potassium chloride (K-DUR) 10 MEQ tablet Take 1 tablet (10 mEq total) by mouth as needed. When you take Lasix. (Patient taking differently: Take 10 mEq by mouth  as needed (take when taking lasix prn). When you take Lasix.)     Allergies:   Codeine; Hydrocodone; and Macrodantin [nitrofurantoin]   Social History   Socioeconomic History  . Marital status: Married    Spouse name: Not on file  . Number of children: Not on file  . Years of education: Not on file  . Highest education level: Not on file  Occupational History  . Occupation: Retired from [employer]  . Financial resource strain: Not on file  . Food insecurity:    Worry: Not on file    Inability: Not on file  . Transportation needs:    Medical: Not on file    Non-medical: Not on file  Tobacco Use  . Smoking status: Never Smoker  . Smokeless tobacco: Never Used  Substance and Sexual Activity  . Alcohol use: No  . Drug use: No  . Sexual activity: Not on file  Lifestyle  . Physical activity:    Days per week: Not on file    Minutes per session: Not  on file  . Stress: Not on file  Relationships  . Social connections:    Talks on phone: Not on file    Gets together: Not on file    Attends religious service: Not on file    Active member of club or organization: Not on file    Attends meetings of clubs or organizations: Not on file    Relationship status: Not on file  Other Topics Concern  . Not on file  Social History Narrative   [place]. Drives, walks some, but not very active at home.      Family History: The patient's family history includes Cancer in her mother; Heart disease in her father and mother; Heart failure in her father and mother. ROS:   Please see the history of present illness.    All 14 point review of systems negative except as described per history of present illness  EKGs/Labs/Other Studies Reviewed:      Recent Labs: 02/26/2018: Platelets 650 09/22/2018: BUN 18; Creatinine, Ser 0.90; Hemoglobin 15.3; Potassium 4.2; Sodium 140  Recent Lipid Panel No results found for: CHOL, TRIG, HDL, CHOLHDL, VLDL, LDLCALC, LDLDIRECT  Physical Exam:    VS:  BP 130/68   Pulse 79   Ht 5\' 7"  (1.702 m)   Wt 194 lb 6.4 oz (88.2 kg)   SpO2 96%   BMI 30.45 kg/m     Wt Readings from Last 3 Encounters:  10/05/18 194 lb 6.4 oz (88.2 kg)  09/22/18 191 lb 7 oz (86.8 kg)  05/06/18 197 lb (89.4 kg)     GEN:  Well nourished, well developed in no acute distress HEENT: Normal NECK: No JVD; No carotid bruits LYMPHATICS: No lymphadenopathy CARDIAC: RRR, no murmurs, no rubs, no gallops RESPIRATORY:  Clear to auscultation without rales, wheezing or rhonchi  ABDOMEN: Soft, non-tender, non-distended MUSCULOSKELETAL:  No edema; No deformity  SKIN: Warm and dry LOWER EXTREMITIES: no swelling NEUROLOGIC:  Alert and oriented x 3 PSYCHIATRIC:  Normal affect   ASSESSMENT:    1. Paroxysmal atrial fibrillation (HCC)   2. Mixed hyperlipidemia   3. Chronic diastolic congestive heart failure ([place])    PLAN:     In order of problems listed above:  1. Paroxysmal atrial fibrillation maintaining sinus rhythm control with flecainide will continue anticoagulated 2. Mixed dyslipidemia we will get fasting lipid profile 3. Chronic diastolic congestive heart failure compensated in control.  Medication Adjustments/Labs and Tests Ordered: Current medicines are reviewed at length with the patient today.  Concerns regarding medicines are outlined above.  No orders of the defined types were placed in this encounter.  Medication changes: No orders of the defined types were placed in this encounter.   Signed, Park Liter, MD, [facility] 10/05/2018 3:12 PM    [place]

## 2018-10-26 DIAGNOSIS — L219 Seborrheic dermatitis, unspecified: Secondary | ICD-10-CM | POA: Diagnosis not present

## 2018-11-07 ENCOUNTER — Other Ambulatory Visit: Payer: Self-pay | Admitting: Cardiology

## 2018-11-16 DIAGNOSIS — D485 Neoplasm of uncertain behavior of skin: Secondary | ICD-10-CM | POA: Diagnosis not present

## 2018-11-16 DIAGNOSIS — R1084 Generalized abdominal pain: Secondary | ICD-10-CM | POA: Diagnosis not present

## 2018-11-16 DIAGNOSIS — L821 Other seborrheic keratosis: Secondary | ICD-10-CM | POA: Diagnosis not present

## 2018-11-16 DIAGNOSIS — N3020 Other chronic cystitis without hematuria: Secondary | ICD-10-CM | POA: Diagnosis not present

## 2018-11-24 DIAGNOSIS — H26493 Other secondary cataract, bilateral: Secondary | ICD-10-CM | POA: Diagnosis not present

## 2018-12-06 DIAGNOSIS — H26493 Other secondary cataract, bilateral: Secondary | ICD-10-CM | POA: Diagnosis not present

## 2018-12-26 HISTORY — PX: PERCUTANEOUS NEPHROSTOMY: SHX2208

## 2018-12-27 DIAGNOSIS — Z79899 Other long term (current) drug therapy: Secondary | ICD-10-CM | POA: Diagnosis not present

## 2018-12-27 DIAGNOSIS — Z6833 Body mass index (BMI) 33.0-33.9, adult: Secondary | ICD-10-CM | POA: Diagnosis not present

## 2018-12-27 DIAGNOSIS — I10 Essential (primary) hypertension: Secondary | ICD-10-CM | POA: Diagnosis not present

## 2018-12-27 DIAGNOSIS — Z0000 Encounter for general adult medical examination without abnormal findings: Secondary | ICD-10-CM | POA: Diagnosis not present

## 2018-12-27 DIAGNOSIS — E785 Hyperlipidemia, unspecified: Secondary | ICD-10-CM | POA: Diagnosis not present

## 2018-12-29 ENCOUNTER — Other Ambulatory Visit: Payer: Self-pay | Admitting: Cardiology

## 2019-01-03 ENCOUNTER — Inpatient Hospital Stay (HOSPITAL_COMMUNITY): Payer: Medicare Other

## 2019-01-03 ENCOUNTER — Emergency Department (HOSPITAL_COMMUNITY): Payer: Medicare Other

## 2019-01-03 ENCOUNTER — Encounter (HOSPITAL_COMMUNITY): Payer: Self-pay | Admitting: Emergency Medicine

## 2019-01-03 ENCOUNTER — Inpatient Hospital Stay (HOSPITAL_COMMUNITY): Payer: Medicare Other | Admitting: Certified Registered Nurse Anesthetist

## 2019-01-03 ENCOUNTER — Other Ambulatory Visit: Payer: Self-pay | Admitting: Urology

## 2019-01-03 ENCOUNTER — Encounter (HOSPITAL_COMMUNITY)
Admission: EM | Disposition: A | Discharge status: Home Health | Payer: Self-pay | Source: Home / Self Care | Attending: Internal Medicine

## 2019-01-03 ENCOUNTER — Inpatient Hospital Stay (HOSPITAL_COMMUNITY)
Admission: EM | Admit: 2019-01-03 | Discharge: 2019-01-13 | DRG: 853 | Disposition: A | Discharge status: Home Health | Payer: Medicare Other | Attending: Family Medicine | Admitting: Family Medicine

## 2019-01-03 ENCOUNTER — Other Ambulatory Visit: Payer: Self-pay

## 2019-01-03 DIAGNOSIS — I252 Old myocardial infarction: Secondary | ICD-10-CM | POA: Diagnosis not present

## 2019-01-03 DIAGNOSIS — T83192A Other mechanical complication of urinary stent, initial encounter: Secondary | ICD-10-CM | POA: Diagnosis present

## 2019-01-03 DIAGNOSIS — R1011 Right upper quadrant pain: Secondary | ICD-10-CM | POA: Diagnosis not present

## 2019-01-03 DIAGNOSIS — N390 Urinary tract infection, site not specified: Secondary | ICD-10-CM | POA: Diagnosis not present

## 2019-01-03 DIAGNOSIS — I2510 Atherosclerotic heart disease of native coronary artery without angina pectoris: Secondary | ICD-10-CM | POA: Diagnosis present

## 2019-01-03 DIAGNOSIS — I480 Paroxysmal atrial fibrillation: Secondary | ICD-10-CM | POA: Diagnosis not present

## 2019-01-03 DIAGNOSIS — N1330 Unspecified hydronephrosis: Secondary | ICD-10-CM | POA: Diagnosis present

## 2019-01-03 DIAGNOSIS — Z8542 Personal history of malignant neoplasm of other parts of uterus: Secondary | ICD-10-CM

## 2019-01-03 DIAGNOSIS — Z8711 Personal history of peptic ulcer disease: Secondary | ICD-10-CM | POA: Diagnosis not present

## 2019-01-03 DIAGNOSIS — I5032 Chronic diastolic (congestive) heart failure: Secondary | ICD-10-CM | POA: Diagnosis not present

## 2019-01-03 DIAGNOSIS — A4151 Sepsis due to Escherichia coli [E. coli]: Secondary | ICD-10-CM | POA: Diagnosis not present

## 2019-01-03 DIAGNOSIS — N136 Pyonephrosis: Secondary | ICD-10-CM | POA: Diagnosis present

## 2019-01-03 DIAGNOSIS — N179 Acute kidney failure, unspecified: Secondary | ICD-10-CM | POA: Diagnosis not present

## 2019-01-03 DIAGNOSIS — I110 Hypertensive heart disease with heart failure: Secondary | ICD-10-CM | POA: Diagnosis not present

## 2019-01-03 DIAGNOSIS — R509 Fever, unspecified: Secondary | ICD-10-CM

## 2019-01-03 DIAGNOSIS — Z96653 Presence of artificial knee joint, bilateral: Secondary | ICD-10-CM | POA: Diagnosis present

## 2019-01-03 DIAGNOSIS — E785 Hyperlipidemia, unspecified: Secondary | ICD-10-CM | POA: Diagnosis present

## 2019-01-03 DIAGNOSIS — Z981 Arthrodesis status: Secondary | ICD-10-CM | POA: Diagnosis not present

## 2019-01-03 DIAGNOSIS — Z466 Encounter for fitting and adjustment of urinary device: Secondary | ICD-10-CM | POA: Diagnosis not present

## 2019-01-03 DIAGNOSIS — J9811 Atelectasis: Secondary | ICD-10-CM | POA: Diagnosis not present

## 2019-01-03 DIAGNOSIS — A419 Sepsis, unspecified organism: Secondary | ICD-10-CM | POA: Diagnosis not present

## 2019-01-03 DIAGNOSIS — N131 Hydronephrosis with ureteral stricture, not elsewhere classified: Secondary | ICD-10-CM

## 2019-01-03 DIAGNOSIS — K219 Gastro-esophageal reflux disease without esophagitis: Secondary | ICD-10-CM | POA: Diagnosis present

## 2019-01-03 DIAGNOSIS — Z9049 Acquired absence of other specified parts of digestive tract: Secondary | ICD-10-CM

## 2019-01-03 DIAGNOSIS — R7881 Bacteremia: Secondary | ICD-10-CM | POA: Diagnosis not present

## 2019-01-03 DIAGNOSIS — R1031 Right lower quadrant pain: Secondary | ICD-10-CM | POA: Diagnosis not present

## 2019-01-03 DIAGNOSIS — Z87442 Personal history of urinary calculi: Secondary | ICD-10-CM | POA: Diagnosis not present

## 2019-01-03 DIAGNOSIS — Z7901 Long term (current) use of anticoagulants: Secondary | ICD-10-CM

## 2019-01-03 DIAGNOSIS — N135 Crossing vessel and stricture of ureter without hydronephrosis: Secondary | ICD-10-CM | POA: Diagnosis not present

## 2019-01-03 DIAGNOSIS — R0789 Other chest pain: Secondary | ICD-10-CM | POA: Diagnosis not present

## 2019-01-03 DIAGNOSIS — Z79899 Other long term (current) drug therapy: Secondary | ICD-10-CM | POA: Diagnosis not present

## 2019-01-03 DIAGNOSIS — Z8249 Family history of ischemic heart disease and other diseases of the circulatory system: Secondary | ICD-10-CM

## 2019-01-03 DIAGNOSIS — N151 Renal and perinephric abscess: Secondary | ICD-10-CM | POA: Diagnosis not present

## 2019-01-03 DIAGNOSIS — Z90710 Acquired absence of both cervix and uterus: Secondary | ICD-10-CM

## 2019-01-03 DIAGNOSIS — Z885 Allergy status to narcotic agent status: Secondary | ICD-10-CM | POA: Diagnosis not present

## 2019-01-03 DIAGNOSIS — N281 Cyst of kidney, acquired: Secondary | ICD-10-CM | POA: Diagnosis not present

## 2019-01-03 DIAGNOSIS — L03311 Cellulitis of abdominal wall: Secondary | ICD-10-CM | POA: Diagnosis not present

## 2019-01-03 DIAGNOSIS — Z96641 Presence of right artificial hip joint: Secondary | ICD-10-CM | POA: Diagnosis present

## 2019-01-03 DIAGNOSIS — R52 Pain, unspecified: Secondary | ICD-10-CM

## 2019-01-03 DIAGNOSIS — Z888 Allergy status to other drugs, medicaments and biological substances status: Secondary | ICD-10-CM

## 2019-01-03 DIAGNOSIS — J9601 Acute respiratory failure with hypoxia: Secondary | ICD-10-CM | POA: Diagnosis not present

## 2019-01-03 DIAGNOSIS — R0609 Other forms of dyspnea: Secondary | ICD-10-CM | POA: Diagnosis not present

## 2019-01-03 DIAGNOSIS — R0602 Shortness of breath: Secondary | ICD-10-CM | POA: Diagnosis not present

## 2019-01-03 DIAGNOSIS — K5730 Diverticulosis of large intestine without perforation or abscess without bleeding: Secondary | ICD-10-CM | POA: Diagnosis not present

## 2019-01-03 HISTORY — PX: CYSTOSCOPY W/ RETROGRADES: SHX1426

## 2019-01-03 LAB — CBC
HCT: 52 % — ABNORMAL HIGH (ref 36.0–46.0)
Hemoglobin: 15.9 g/dL — ABNORMAL HIGH (ref 12.0–15.0)
MCH: 27.2 pg (ref 26.0–34.0)
MCHC: 30.6 g/dL (ref 30.0–36.0)
MCV: 88.9 fL (ref 80.0–100.0)
Platelets: 653 10*3/uL — ABNORMAL HIGH (ref 150–400)
RBC: 5.85 MIL/uL — ABNORMAL HIGH (ref 3.87–5.11)
RDW: 14.8 % (ref 11.5–15.5)
WBC: 17.1 10*3/uL — ABNORMAL HIGH (ref 4.0–10.5)
nRBC: 0 % (ref 0.0–0.2)

## 2019-01-03 LAB — URINALYSIS, ROUTINE W REFLEX MICROSCOPIC
Bilirubin Urine: NEGATIVE
Glucose, UA: NEGATIVE mg/dL
Ketones, ur: NEGATIVE mg/dL
Nitrite: POSITIVE — AB
Protein, ur: 100 mg/dL — AB
RBC / HPF: >50 RBC/hpf — ABNORMAL HIGH (ref 0–5)
Specific Gravity, Urine: 1.03 (ref 1.005–1.030)
WBC, UA: >50 WBC/hpf — ABNORMAL HIGH (ref 0–5)
pH: 5 (ref 5.0–8.0)

## 2019-01-03 LAB — HEPATIC FUNCTION PANEL
ALT: 13 U/L (ref 0–44)
AST: 23 U/L (ref 15–41)
Albumin: 3.7 g/dL (ref 3.5–5.0)
Alkaline Phosphatase: 79 U/L (ref 38–126)
Bilirubin, Direct: 0.3 mg/dL — ABNORMAL HIGH (ref 0.0–0.2)
Indirect Bilirubin: 0.9 mg/dL (ref 0.3–0.9)
Total Bilirubin: 1.2 mg/dL (ref 0.3–1.2)
Total Protein: 7.5 g/dL (ref 6.5–8.1)

## 2019-01-03 LAB — BASIC METABOLIC PANEL
Anion gap: 12 (ref 5–15)
BUN: 22 mg/dL (ref 8–23)
CO2: 16 mmol/L — ABNORMAL LOW (ref 22–32)
Calcium: 8.9 mg/dL (ref 8.9–10.3)
Chloride: 107 mmol/L (ref 98–111)
Creatinine, Ser: 1.2 mg/dL — ABNORMAL HIGH (ref 0.44–1.00)
GFR calc Af Amer: 51 mL/min — ABNORMAL LOW (ref >60)
GFR calc non Af Amer: 44 mL/min — ABNORMAL LOW (ref >60)
Glucose, Bld: 146 mg/dL — ABNORMAL HIGH (ref 70–99)
Potassium: 3.7 mmol/L (ref 3.5–5.1)
Sodium: 135 mmol/L (ref 135–145)

## 2019-01-03 LAB — APTT: aPTT: 35 seconds (ref 24–36)

## 2019-01-03 LAB — HEPARIN LEVEL (UNFRACTIONATED): Heparin Unfractionated: >2.2 IU/mL — ABNORMAL HIGH (ref 0.30–0.70)

## 2019-01-03 LAB — LIPASE, BLOOD: Lipase: 29 U/L (ref 11–51)

## 2019-01-03 LAB — TROPONIN I: Troponin I: <0.03 ng/mL (ref <0.03)

## 2019-01-03 SURGERY — CYSTOSCOPY, WITH RETROGRADE PYELOGRAM
Anesthesia: General | Laterality: Right

## 2019-01-03 MED ORDER — SODIUM CHLORIDE 0.9 % IR SOLN
Status: DC | PRN
  Administered 2019-01-03: 3000 mL

## 2019-01-03 MED ORDER — ONDANSETRON 4 MG PO TBDP
4.0000 mg | ORAL_TABLET | Freq: Four times a day (QID) | ORAL | Status: DC | PRN
  Administered 2019-01-11: 4 mg via ORAL
  Filled 2019-01-03: qty 1

## 2019-01-03 MED ORDER — MORPHINE SULFATE (PF) 4 MG/ML IV SOLN
4.0000 mg | Freq: Once | INTRAVENOUS | Status: AC
  Administered 2019-01-03: 4 mg via INTRAVENOUS
  Filled 2019-01-03: qty 1

## 2019-01-03 MED ORDER — PHENYLEPHRINE 40 MCG/ML (10ML) SYRINGE FOR IV PUSH (FOR BLOOD PRESSURE SUPPORT)
PREFILLED_SYRINGE | INTRAVENOUS | Status: DC | PRN
  Administered 2019-01-03: 200 ug via INTRAVENOUS
  Administered 2019-01-03: 100 ug via INTRAVENOUS
  Administered 2019-01-03: 150 ug via INTRAVENOUS
  Administered 2019-01-03 (×2): 100 ug via INTRAVENOUS

## 2019-01-03 MED ORDER — DILTIAZEM HCL ER COATED BEADS 120 MG PO CP24
120.0000 mg | ORAL_CAPSULE | Freq: Every morning | ORAL | Status: DC
  Administered 2019-01-04 – 2019-01-13 (×10): 120 mg via ORAL
  Filled 2019-01-03 (×10): qty 1

## 2019-01-03 MED ORDER — ONDANSETRON HCL 4 MG/2ML IJ SOLN
INTRAMUSCULAR | Status: AC
  Filled 2019-01-03: qty 2

## 2019-01-03 MED ORDER — SENNOSIDES-DOCUSATE SODIUM 8.6-50 MG PO TABS
2.0000 | ORAL_TABLET | Freq: Two times a day (BID) | ORAL | Status: DC
  Administered 2019-01-03 – 2019-01-10 (×4): 2 via ORAL
  Administered 2019-01-11: 1 via ORAL
  Filled 2019-01-03 (×16): qty 2

## 2019-01-03 MED ORDER — IOPAMIDOL (ISOVUE-300) INJECTION 61%
INTRAVENOUS | Status: AC
  Administered 2019-01-03: 12:00:00
  Filled 2019-01-03: qty 100

## 2019-01-03 MED ORDER — PROPOFOL 10 MG/ML IV BOLUS
INTRAVENOUS | Status: AC
  Filled 2019-01-03: qty 20

## 2019-01-03 MED ORDER — LEVOFLOXACIN IN D5W 750 MG/150ML IV SOLN
750.0000 mg | Freq: Once | INTRAVENOUS | Status: DC
  Administered 2019-01-03: 750 mg via INTRAVENOUS
  Filled 2019-01-03: qty 150

## 2019-01-03 MED ORDER — SODIUM CHLORIDE 0.9 % IV SOLN
INTRAVENOUS | Status: DC | PRN
  Administered 2019-01-03: 40 ug/min via INTRAVENOUS

## 2019-01-03 MED ORDER — SODIUM CHLORIDE 0.9 % IV SOLN
1.0000 g | INTRAVENOUS | Status: DC
  Administered 2019-01-03: 1 g via INTRAVENOUS
  Filled 2019-01-03: qty 1

## 2019-01-03 MED ORDER — SODIUM CHLORIDE 0.9 % IV SOLN
INTRAVENOUS | Status: AC
  Filled 2019-01-03: qty 10

## 2019-01-03 MED ORDER — ONDANSETRON HCL 4 MG/2ML IJ SOLN
4.0000 mg | Freq: Four times a day (QID) | INTRAMUSCULAR | Status: DC | PRN
  Administered 2019-01-04 – 2019-01-13 (×21): 4 mg via INTRAVENOUS
  Filled 2019-01-03 (×21): qty 2

## 2019-01-03 MED ORDER — FENTANYL CITRATE (PF) 100 MCG/2ML IJ SOLN
INTRAMUSCULAR | Status: DC | PRN
  Administered 2019-01-03 (×2): 25 ug via INTRAVENOUS

## 2019-01-03 MED ORDER — MEPERIDINE HCL 50 MG/ML IJ SOLN: 6.2500 mg | INTRAMUSCULAR | Status: DC | PRN

## 2019-01-03 MED ORDER — FLECAINIDE ACETATE 50 MG PO TABS
75.0000 mg | ORAL_TABLET | Freq: Two times a day (BID) | ORAL | Status: DC
  Administered 2019-01-03 – 2019-01-13 (×20): 75 mg via ORAL
  Filled 2019-01-03 (×20): qty 2

## 2019-01-03 MED ORDER — PHENYLEPHRINE HCL 10 MG/ML IJ SOLN
INTRAMUSCULAR | Status: AC
  Filled 2019-01-03: qty 1

## 2019-01-03 MED ORDER — SODIUM CHLORIDE (PF) 0.9 % IJ SOLN
INTRAMUSCULAR | Status: AC
  Filled 2019-01-03: qty 10

## 2019-01-03 MED ORDER — ACETAMINOPHEN 325 MG PO TABS
650.0000 mg | ORAL_TABLET | Freq: Four times a day (QID) | ORAL | Status: DC | PRN
  Administered 2019-01-04 – 2019-01-07 (×4): 650 mg via ORAL
  Filled 2019-01-03 (×4): qty 2

## 2019-01-03 MED ORDER — LIDOCAINE 2% (20 MG/ML) 5 ML SYRINGE
INTRAMUSCULAR | Status: AC
  Filled 2019-01-03: qty 5

## 2019-01-03 MED ORDER — CARVEDILOL 3.125 MG PO TABS
3.1250 mg | ORAL_TABLET | Freq: Two times a day (BID) | ORAL | Status: DC
  Administered 2019-01-03 – 2019-01-13 (×20): 3.125 mg via ORAL
  Filled 2019-01-03 (×20): qty 1

## 2019-01-03 MED ORDER — IOPAMIDOL (ISOVUE-300) INJECTION 61%
100.0000 mL | Freq: Once | INTRAVENOUS | Status: AC | PRN
  Administered 2019-01-03: 80 mL via INTRAVENOUS

## 2019-01-03 MED ORDER — POLYETHYLENE GLYCOL 3350 17 G PO PACK
17.0000 g | PACK | Freq: Every day | ORAL | Status: DC
  Administered 2019-01-10: 17 g via ORAL
  Filled 2019-01-03 (×6): qty 1

## 2019-01-03 MED ORDER — HYDROMORPHONE HCL 1 MG/ML IJ SOLN
0.5000 mg | INTRAMUSCULAR | Status: DC | PRN
  Administered 2019-01-03 – 2019-01-12 (×23): 0.5 mg via INTRAVENOUS
  Filled 2019-01-03 (×24): qty 0.5

## 2019-01-03 MED ORDER — ONDANSETRON HCL 4 MG/2ML IJ SOLN
4.0000 mg | Freq: Once | INTRAMUSCULAR | Status: AC
  Administered 2019-01-03: 4 mg via INTRAVENOUS
  Filled 2019-01-03: qty 2

## 2019-01-03 MED ORDER — FENTANYL CITRATE (PF) 100 MCG/2ML IJ SOLN
INTRAMUSCULAR | Status: AC
  Filled 2019-01-03: qty 2

## 2019-01-03 MED ORDER — PHENYLEPHRINE 40 MCG/ML (10ML) SYRINGE FOR IV PUSH (FOR BLOOD PRESSURE SUPPORT)
PREFILLED_SYRINGE | INTRAVENOUS | Status: AC
  Filled 2019-01-03: qty 10

## 2019-01-03 MED ORDER — AMITRIPTYLINE HCL 25 MG PO TABS
25.0000 mg | ORAL_TABLET | Freq: Every day | ORAL | Status: DC
  Administered 2019-01-03 – 2019-01-12 (×10): 25 mg via ORAL
  Filled 2019-01-03 (×10): qty 1

## 2019-01-03 MED ORDER — APIXABAN 5 MG PO TABS
5.0000 mg | ORAL_TABLET | Freq: Two times a day (BID) | ORAL | Status: DC
  Administered 2019-01-03 – 2019-01-08 (×10): 5 mg via ORAL
  Filled 2019-01-03 (×10): qty 1

## 2019-01-03 MED ORDER — SODIUM CHLORIDE 0.9 % IV SOLN
1.0000 g | Freq: Three times a day (TID) | INTRAVENOUS | Status: DC
  Administered 2019-01-03 – 2019-01-04 (×2): 1 g via INTRAVENOUS
  Filled 2019-01-03 (×5): qty 1

## 2019-01-03 MED ORDER — SODIUM CHLORIDE 0.9 % IV SOLN: 2.0000 g | INTRAVENOUS | Status: DC

## 2019-01-03 MED ORDER — DEXAMETHASONE SODIUM PHOSPHATE 10 MG/ML IJ SOLN
INTRAMUSCULAR | Status: AC
  Filled 2019-01-03: qty 1

## 2019-01-03 MED ORDER — SODIUM CHLORIDE 0.9 % IV SOLN
1.0000 g | INTRAVENOUS | Status: DC
  Filled 2019-01-03: qty 10

## 2019-01-03 MED ORDER — SODIUM CHLORIDE 0.9 % IV SOLN
1.0000 g | Freq: Once | INTRAVENOUS | Status: AC
  Administered 2019-01-03: 1 g via INTRAVENOUS
  Filled 2019-01-03: qty 1

## 2019-01-03 MED ORDER — LACTATED RINGERS IV SOLN
INTRAVENOUS | Status: DC
  Administered 2019-01-03 – 2019-01-12 (×14): via INTRAVENOUS

## 2019-01-03 MED ORDER — SODIUM CHLORIDE (PF) 0.9 % IJ SOLN
INTRAMUSCULAR | Status: AC
  Administered 2019-01-03: 12:00:00
  Filled 2019-01-03: qty 50

## 2019-01-03 MED ORDER — METOCLOPRAMIDE HCL 5 MG/ML IJ SOLN
10.0000 mg | Freq: Once | INTRAMUSCULAR | Status: AC | PRN
  Administered 2019-01-03: 10 mg via INTRAVENOUS

## 2019-01-03 MED ORDER — FENTANYL CITRATE (PF) 100 MCG/2ML IJ SOLN: 25.0000 ug | INTRAMUSCULAR | Status: DC | PRN

## 2019-01-03 MED ORDER — HEPARIN (PORCINE) 25000 UT/250ML-% IV SOLN: 1150.0000 [IU]/h | INTRAVENOUS | Status: DC

## 2019-01-03 MED ORDER — METOCLOPRAMIDE HCL 5 MG/ML IJ SOLN
INTRAMUSCULAR | Status: AC
  Filled 2019-01-03: qty 2

## 2019-01-03 MED ORDER — FERROUS SULFATE 325 (65 FE) MG PO TABS
325.0000 mg | ORAL_TABLET | Freq: Two times a day (BID) | ORAL | Status: DC
  Administered 2019-01-03 – 2019-01-09 (×11): 325 mg via ORAL
  Filled 2019-01-03 (×12): qty 1

## 2019-01-03 MED ORDER — PANTOPRAZOLE SODIUM 40 MG PO TBEC
40.0000 mg | DELAYED_RELEASE_TABLET | Freq: Every day | ORAL | Status: DC
  Administered 2019-01-04 – 2019-01-13 (×10): 40 mg via ORAL
  Filled 2019-01-03 (×10): qty 1

## 2019-01-03 SURGICAL SUPPLY — 24 items
BAG URO CATCHER STRL LF (MISCELLANEOUS) ×2 IMPLANT
BASKET STONE NCOMPASS (UROLOGICAL SUPPLIES) IMPLANT
CATH URET 5FR 28IN OPEN ENDED (CATHETERS) IMPLANT
CATH URET DUAL LUMEN 6-10FR 50 (CATHETERS) ×2 IMPLANT
CLOTH BEACON ORANGE TIMEOUT ST (SAFETY) ×2 IMPLANT
COVER WAND RF STERILE (DRAPES) IMPLANT
EXTRACTOR STONE NITINOL NGAGE (UROLOGICAL SUPPLIES) ×2 IMPLANT
FIBER LASER FLEXIVA 1000 (UROLOGICAL SUPPLIES) IMPLANT
FIBER LASER FLEXIVA 365 (UROLOGICAL SUPPLIES) IMPLANT
FIBER LASER FLEXIVA 550 (UROLOGICAL SUPPLIES) IMPLANT
FIBER LASER TRAC TIP (UROLOGICAL SUPPLIES) ×1 IMPLANT
GLOVE SURG SS PI 8.0 STRL IVOR (GLOVE) IMPLANT
GOWN STRL REUS W/TWL XL LVL3 (GOWN DISPOSABLE) ×2 IMPLANT
GUIDEWIRE STR DUAL SENSOR (WIRE) ×2 IMPLANT
IV NS 1000ML (IV SOLUTION) ×1
IV NS 1000ML BAXH (IV SOLUTION) ×1 IMPLANT
IV NS IRRIG 3000ML ARTHROMATIC (IV SOLUTION) ×2 IMPLANT
KIT TURNOVER KIT A (KITS) IMPLANT
MANIFOLD NEPTUNE II (INSTRUMENTS) ×2 IMPLANT
PACK CYSTO (CUSTOM PROCEDURE TRAY) ×2 IMPLANT
SHEATH URETERAL 12FRX35CM (MISCELLANEOUS) ×2 IMPLANT
TRAY FOLEY CATH 14FRSI W/METER (CATHETERS) ×1 IMPLANT
TUBING CONNECTING 10 (TUBING) ×2 IMPLANT
TUBING UROLOGY SET (TUBING) ×2 IMPLANT

## 2019-01-03 NOTE — Anesthesia Postprocedure Evaluation (Signed)
Anesthesia Post Note  Patient: [name] [name]  Procedure(s) Performed: CYSTOSCOPY WITH RETROGRADE PYELOGRAM RIGHT WITH STENT EXCHANGE (Right )     Patient location during evaluation: PACU Anesthesia Type: General Level of consciousness: awake and alert Pain management: pain level controlled Vital Signs Assessment: post-procedure vital signs reviewed and stable Respiratory status: spontaneous breathing, nonlabored ventilation, respiratory function stable and patient connected to nasal cannula oxygen Cardiovascular status: blood pressure returned to baseline and stable Postop Assessment: no apparent nausea or vomiting Anesthetic complications: no    Last Vitals:  Vitals:   01/03/19 1915 01/03/19 1940  BP: (!) 100/52 (!) 103/49  Pulse: 81 78  Resp: (!) 24 17  Temp: 37.1 C (!) 36.4 C  SpO2: 92% 95%    Last Pain:  Vitals:   01/03/19 2134  TempSrc:   PainSc: 8                  Montez Hageman

## 2019-01-03 NOTE — ED Notes (Addendum)
ED TO INPATIENT HANDOFF REPORT  ED Nurse Name and Phone #: Leafy Ro RN  S Name/Age/Gender [name] [name] 78 y.o. female Room/Bed: WA10/WA10  Code Status   Code Status: Prior  Home/SNF/Other Home Patient oriented to: self, place, time and situation Is this baseline? Yes   Triage Complete: Triage complete  Chief Complaint bladder stint issue  Triage Note Pt complaint of severe right side pain and SOB onset 0400 this morning. Concerned that it is related to flank pain/stent; hx of same. Positive for n/v.   Allergies Allergies  Allergen Reactions  . Codeine Nausea Only and Other (See Comments)    hallucinations  . Hydrocodone Nausea Only and Other (See Comments)  . Macrodantin [Nitrofurantoin] Nausea And Vomiting    Level of Care/Admitting Diagnosis ED Disposition    ED Disposition Condition Comment   Admit  Hospital Area: [facility] [[id number]]  Level of Care: Telemetry [5]  Admit to tele based on following criteria: Monitor for Ischemic changes  Diagnosis: Hydronephrosis [591.ICD-9-CM]  Admitting Physician: Kayleen Memos [[id number]]  Attending Physician: Kayleen Memos [[id number]]  Estimated length of stay: past midnight tomorrow  Certification:: I certify this patient will need inpatient services for at least 2 midnights  PT Class (Do Not Modify): Inpatient [101]  PT Acc Code (Do Not Modify): Private [1]       B Medical/Surgery History Past Medical History:  Diagnosis Date  . Abdominal aortic atherosclerosis ([place]) 05/07/2016   Noted on CT abd/pelvis  . Anticoagulant long-term use    ELIQUIS  . Atrial fibrillation with RVR ([place])   . Chronic diastolic (congestive) heart failure ([place])   . Coronary artery disease   . Cystocele, midline    followed by dr c. Marvel Plan ([facility] [place])  pt uses pessary  . Diverticulosis 05/07/2016   Noted on CT abd/pelvis  . Dyspnea    w exertion d/t Afib per pt  . First degree  heart block   . GERD (gastroesophageal reflux disease)   . Hematuria    intermittently d/t JJ stent  . Hiatal hernia   . History of arteriovenostomy for renal dialysis ([place])   . History of colon polyps   . History of gastric polyp   . History of kidney stones   . History of peptic ulcer disease   . History of septic shock    2011 (pt unaware) and 06-04-2013  w/ acute renal failure  . History of uterine cancer    STAGE I  --  S/P TAH W/ BSO  (NO OTHER TX)  . History of ventilator dependency ([place]) 2014  . Hydronephrosis, right 05/07/2016   Moderate, Noted on CT abd/pelvis  . Hyperlipidemia   . Hypertension   . Iron deficiency anemia hematology/ oncology--- dr Bobby Rumpf at [facility] in [place]--- per lov note 08-07-2017  stabilized and felt to be more chronic anemia   01/ 2018  dx iron def. anemia  s/p  IV Iron infusion and taking oral iron supplement  . PAF (paroxysmal atrial fibrillation) ([place]) DX JUNE 2012   CARDIOLOGIST--  DR Agustin Cree ([place] [facility] [place]--- that office has changed to [place] [facility]))  CHADS2  . Presence of pessary    for midline pessary  . Ureteral stricture, right    has JJ stent  . Wears glasses    Past Surgical History:  Procedure Laterality Date  . CARDIOVERSION  09/ 2018   dr Agustin Cree   successful ([facility] )  .  CHOLECYSTECTOMY  2010  . COLONOSCOPY W/ POLYPECTOMY  05/2017  . CYSTO/  BALLOON DILATION RIGHT URETERAL STRICTURE/ STENT PLACEMENT  06-02-2013  . CYSTO/  BILATERAL RETROGRADE PYELOGRAM/ RIGHT URETEROSCOPY AND STENT PLACEMENT  10-04-2005  . CYSTO/ LEFT URETEROSCOPIC STONE EXTRACTION  04-03-2006  . CYSTOSCOPY W/ URETERAL STENT PLACEMENT Right 02/06/2014   Procedure: CYSTOSCOPY WITH RIGHT RETROGRADE PYELOGRAM RIGHT STENT REMOVAL and REPLACEMENT, Right Ureteroscopy;  Surgeon: Ailene Rud, MD;  Location: [facility];  Service: Urology;  Laterality: Right;  . CYSTOSCOPY W/ URETERAL STENT  PLACEMENT Right 11/26/2016   Procedure: CYSTOSCOPY WITH RETROGRADE PYELOGRAM/URETERAL STENT REPLACEMENT;  Surgeon: Alexis Frock, MD;  Location: [facility];  Service: Urology;  Laterality: Right;  . CYSTOSCOPY W/ URETERAL STENT PLACEMENT Right 04/24/2017   Procedure: CYSTOSCOPY WITH RETROGRADE PYELOGRAM/URETERAL STENT REPLACEMENT;  Surgeon: Alexis Frock, MD;  Location: [facility];  Service: Urology;  Laterality: Right;  . CYSTOSCOPY W/ URETERAL STENT PLACEMENT Right 10/07/2017   Procedure: CYSTOSCOPY WITH RETROGRADE PYELOGRAM/URETERAL STENT EXCHANGE;  Surgeon: Alexis Frock, MD;  Location: [facility];  Service: Urology;  Laterality: Right;  . CYSTOSCOPY W/ URETERAL STENT PLACEMENT Right 03/03/2018   Procedure: CYSTOSCOPY WITH RIGHT RETROGRADE / RIGHT URETERAL STENT EXCHANGE;  Surgeon: Alexis Frock, MD;  Location: [facility];  Service: Urology;  Laterality: Right;  . CYSTOSCOPY W/ URETERAL STENT PLACEMENT Right 09/22/2018   Procedure: CYSTOSCOPY WITH RETROGRADE PYELOGRAM/URETERAL STENT PLACEMENT;  Surgeon: Alexis Frock, MD;  [facility];  Service: Urology;  Laterality: Right;  45 MINS  . CYSTOSCOPY WITH LITHOLAPAXY N/A 11/21/2013   Procedure: CYSTOSCOPY WITH LITHOLAPAXY;  Surgeon: Ailene Rud, MD;  Location: [facility];  Service: Urology;  Laterality: N/A;  . CYSTOSCOPY WITH RETROGRADE PYELOGRAM, URETEROSCOPY AND STENT PLACEMENT Bilateral 03/15/2014   Procedure: CYSTOSCOPY WITH BILATERAL RETROGRADE PYELOGRAM, RIGHT DIAGNOSTIC URETEROSCOPY AND STENT EXCHANGE;  Surgeon: Alexis Frock, MD;  Location: [facility];  Service: Urology;  Laterality: Bilateral;  . CYSTOSCOPY WITH STENT PLACEMENT Right 05/07/2016   Procedure: CYSTOSCOPY WITH RETROGRADE PYELOGRAM, URETERAL STENT PLACEMENT;  Surgeon: Franchot Gallo, MD;  Location: [facility];  Service: Urology;  Laterality: Right;  . HEMIARTHROPLASTY HIP Right 11/2016    fractured  . JOINT REPLACEMENT Right 11/2016   Hip Replacement  . LUMBAR FUSION  JULY 2013  . NEPHROLITHOTOMY  X2 YRS AGO  . PERCUTANEOUS NEPHROSTOMY  2014  . TOTAL ABDOMINAL HYSTERECTOMY W/ BILATERAL SALPINGOOPHORECTOMY  1989  . TOTAL KNEE ARTHROPLASTY Bilateral 1992  &  1996  . TRANSTHORACIC ECHOCARDIOGRAM  07-24-2017   dr Agustin Cree   ef 60-65% (improved from last echo 2014, was 45-50%)/  trace TR/  mild AV sclerosis without stenosis  . URETEROSCOPY Right 11/21/2013   Procedure: URETEROSCOPY WITH STENT REMOVAL AND REPLACEMENT;  Surgeon: Ailene Rud, MD;  Location: [facility];  Service: Urology;  Laterality: Right;     A IV Location/Drains/Wounds Patient Lines/Drains/Airways Status   Active Line/Drains/Airways    Name:   Placement date:   Placement time:   Site:   Days:   Peripheral IV 01/03/19 Right Antecubital   01/03/19    1142    Antecubital   less than 1   Nephrostomy Right 10 Fr.   06/04/13    1550    Right   2039   Ureteral Drain/Stent Right ureter 7 Fr.   10/07/17    0846    Right ureter   453   Ureteral Drain/Stent Right ureter  8 Fr.   09/22/18    1515    Right ureter   103   Airway   09/22/18    1500     103   Incision 06/04/13 Back Right;Posterior;Mid   06/04/13    1548     2039   Incision 11/21/13 Perineum Other (Comment)   11/21/13    0944     1869   Incision (Closed) 02/06/14 Perineum Other (Comment)   02/06/14    0917     1792   Incision (Closed) 05/07/16 N/A   05/07/16    1936     971   Incision (Closed) 11/26/16 Perineum Right   11/26/16    1144     768   Incision (Closed) 04/24/17 Perineum   04/24/17    1230     619   Incision (Closed) 10/07/17 Perineum   10/07/17    0812     453   Incision (Closed) 09/22/18 Perineum   09/22/18    1411     103   Wound 06/10/13 Other (Comment) Rectum excoriation around rectum, where rectal pouch had been placed   06/10/13    1930    Rectum   2033          Intake/Output Last 24 hours No intake or output  data in the 24 hours ending 01/03/19 1417  Labs/Imaging Results for orders placed or performed during the hospital encounter of 01/03/19 (from the past 48 hour(s))  Lipase, blood     Status: None   Collection Time: 01/03/19 10:48 AM  Result Value Ref Range   Lipase 29 11 - 51 U/L    Comment: Performed at [facility], [place] [address] [place], [place] [address]  Urinalysis, Routine w reflex microscopic- may I&O cath if menses     Status: Abnormal   Collection Time: 01/03/19 10:50 AM  Result Value Ref Range   Color, Urine AMBER (A) YELLOW    Comment: BIOCHEMICALS MAY BE AFFECTED BY COLOR   APPearance CLOUDY (A) CLEAR   Specific Gravity, Urine 1.030 1.005 - 1.030   pH 5.0 5.0 - 8.0   Glucose, UA NEGATIVE NEGATIVE mg/dL   Hgb urine dipstick LARGE (A) NEGATIVE   Bilirubin Urine NEGATIVE NEGATIVE   Ketones, ur NEGATIVE NEGATIVE mg/dL   Protein, ur 100 (A) NEGATIVE mg/dL   Nitrite POSITIVE (A) NEGATIVE   Leukocytes,Ua MODERATE (A) NEGATIVE   RBC / HPF >50 (H) 0 - 5 RBC/hpf   WBC, UA >50 (H) 0 - 5 WBC/hpf   Bacteria, UA MANY (A) NONE SEEN   Squamous Epithelial / LPF 6-10 0 - 5   WBC Clumps PRESENT    Mucus PRESENT    Ca Oxalate Crys, UA PRESENT     Comment: Performed at [facility], [place] [address], [place], [place] [address]  Basic metabolic panel     Status: Abnormal   Collection Time: 01/03/19 10:50 AM  Result Value Ref Range   Sodium 135 135 - 145 mmol/L   Potassium 3.7 3.5 - 5.1 mmol/L   Chloride 107 98 - 111 mmol/L   CO2 16 (L) 22 - 32 mmol/L   Glucose, Bld 146 (H) 70 - 99 mg/dL   BUN 22 8 - 23 mg/dL   Creatinine, Ser 1.20 (H) 0.44 - 1.00 mg/dL   Calcium 8.9 8.9 - 10.3 mg/dL   GFR calc non Af Amer 44 (L) >60 mL/min   GFR calc Af Wyvonnia Lora  51 (L) >60 mL/min   Anion gap 12 5 - 15    Comment: Performed at [facility], [place] [address], [place], [place] [address]  CBC     Status: Abnormal   Collection Time: 01/03/19 10:50 AM   Result Value Ref Range   WBC 17.1 (H) 4.0 - 10.5 K/uL   RBC 5.85 (H) 3.87 - 5.11 MIL/uL   Hemoglobin 15.9 (H) 12.0 - 15.0 g/dL   HCT 52.0 (H) 36.0 - 46.0 %   MCV 88.9 80.0 - 100.0 fL   MCH 27.2 26.0 - 34.0 pg   MCHC 30.6 30.0 - 36.0 g/dL   RDW 14.8 11.5 - 15.5 %   Platelets 653 (H) 150 - 400 K/uL   nRBC 0.0 0.0 - 0.2 %    Comment: Performed at [facility], [place] [address], [place], [place] [address]  Troponin I - Add-On to previous collection     Status: None   Collection Time: 01/03/19 10:50 AM  Result Value Ref Range   Troponin I <0.03 <0.03 ng/mL    Comment: Performed at [facility], [place] [address] [place], [place] [address]  Hepatic function panel     Status: Abnormal   Collection Time: 01/03/19 10:50 AM  Result Value Ref Range   Total Protein 7.5 6.5 - 8.1 g/dL   Albumin 3.7 3.5 - 5.0 g/dL   AST 23 15 - 41 U/L   ALT 13 0 - 44 U/L   Alkaline Phosphatase 79 38 - 126 U/L   Total Bilirubin 1.2 0.3 - 1.2 mg/dL   Bilirubin, Direct 0.3 (H) 0.0 - 0.2 mg/dL   Indirect Bilirubin 0.9 0.3 - 0.9 mg/dL    Comment: Performed at [facility], [place] [address], [place],  [address]   Dg Chest 2 View  Result Date: 01/03/2019 CLINICAL DATA:  Acute onset severe right side pain and shortness of breath at 4 a.m. this morning. EXAM: CHEST - 2 VIEW COMPARISON:  PA and lateral chest 07/28/2017. Single-view of the chest 05/08/2015. FINDINGS: The lungs are clear. Heart size is normal. There is no pneumothorax or pleural effusion. Aortic atherosclerosis is noted. No acute bony abnormality. Remote compression fracture in the upper lumbar spine is seen on the 2018 exam. IMPRESSION: No acute disease. Atherosclerosis. Electronically Signed   By: Inge Rise M.D.   On: 01/03/2019 10:17   Ct Abdomen Pelvis W Contrast  Result Date: 01/03/2019 CLINICAL DATA:  Acute onset right side abdominal pain and shortness of breath at 4 a.m. today. EXAM:  CT ABDOMEN AND PELVIS WITH CONTRAST TECHNIQUE: Multidetector CT imaging of the abdomen and pelvis was performed using the standard protocol following bolus administration of intravenous contrast. CONTRAST:  80 mL ISOVUE-300 IOPAMIDOL (ISOVUE-300) INJECTION 61% COMPARISON:  CT abdomen and pelvis 05/07/2016. FINDINGS: Lower chest: There is mild cardiomegaly. Dependent atelectasis is greater on the right. No pleural or pericardial effusion. Hepatobiliary: Status post cholecystectomy. No focal lesion. Minimal intrahepatic biliary ductal dilatation is likely related to cholecystectomy. Pancreas: Unremarkable. No pancreatic ductal dilatation or surrounding inflammatory changes. Spleen: Normal in size without focal abnormality. Adrenals/Urinary Tract: The patient has a right double-J ureteral stent in place which is new since the prior CT. The stent projects in good position but there is moderate to moderately severe right hydronephrosis which appears unchanged since the prior examination. Extensive stranding is present about the right kidney and proximal ureter and worse than on the prior study. Left renal  cyst is unchanged. The kidneys and ureters are otherwise unremarkable. The urinary bladder is almost completely decompressed. The adrenal glands appear normal. Stomach/Bowel: Mild sigmoid diverticulosis without diverticulitis is noted. The colon is otherwise normal in appearance. The appendix is not seen but no evidence of appendicitis is identified. The stomach and small bowel appear normal. Vascular/Lymphatic: Aortic atherosclerosis. No enlarged abdominal or pelvic lymph nodes. Reproductive: Status post hysterectomy. No adnexal masses. Pessary device noted. Other: None. Musculoskeletal: The patient is status post L3-5 fusion. Remote L2 compression fracture is unchanged. No acute or new bony abnormality is identified. IMPRESSION: Moderate to moderately severe right hydronephrosis does not appear markedly changed  compared to the prior examination with a right double-J ureteral stent in good position. Extensive stranding about the right kidney and proximal ureter is worse than on the prior exam and worrisome for urinary tract infection/ureteritis. No CT signs of pyelonephritis. No abscess. Atherosclerosis. Mild sigmoid diverticulosis without diverticulitis. Electronically Signed   By: Inge Rise M.D.   On: 01/03/2019 12:49    Pending Labs Unresulted Labs (From admission, onward)   None      Vitals/Pain Today's Vitals   01/03/19 1230 01/03/19 1300 01/03/19 1330 01/03/19 1400  BP: 125/67 111/73 125/72 120/68  Pulse: 93 87 88 90  Resp:      Temp:      TempSrc:      SpO2: 92% 93% 93% (!) 85%  PainSc:        Isolation Precautions No active isolations  Medications Medications  sodium chloride (PF) 0.9 % injection (has no administration in time range)  iopamidol (ISOVUE-300) 61 % injection (has no administration in time range)  levofloxacin (LEVAQUIN) IVPB 750 mg (750 mg Intravenous New Bag/Given 01/03/19 1415)  morphine 4 MG/ML injection 4 mg (4 mg Intravenous Given 01/03/19 1143)  ondansetron (ZOFRAN) injection 4 mg (4 mg Intravenous Given 01/03/19 1142)  iopamidol (ISOVUE-300) 61 % injection 100 mL (80 mLs Intravenous Contrast Given 01/03/19 1202)  morphine 4 MG/ML injection 4 mg (4 mg Intravenous Given 01/03/19 1415)    Mobility walks with device Low fall risk   Focused Assessments Alert and Orientec x4   R Recommendations: See Admitting Provider Note  Report given to: [facility]  Additional Notes: Husband at bedside. Patient requires [facility] at this time to keep sats above 92%. Last dose of morphine 4mg  IV administered at 1415.

## 2019-01-03 NOTE — Progress Notes (Signed)
Pharmacy Antibiotic Note  [name] [name] is a 78 y.o. female admitted on 01/03/2019 with Sepsis secondary to complicated UTI with suspected right pyelonephritis.  Pharmacy has been consulted for meropenem dosing.  He has been given Levaquin 750 mg and Rocephin 1 gm today.  S/p cystoscopy w/ R ureteroscopy w/ findings of obstructed. Encrusted retained R ureteral stent w/ infection. - removed and fresh stent placed. T max 101.3 Scr 1.2, anticipate will improve now that obstruction removed.   Plan: Meropenem 1 gm IV q8h F/u renal fxn, sign off if stabilizes  Height: 5\' 7"  (170.2 cm) Weight: 189 lb (85.7 kg) IBW/kg (Calculated) : 61.6  Temp (24hrs), Avg:98.5 F (36.9 C), Min:97.9 F (36.6 C), Max:98.8 F (37.1 C)  Recent Labs  Lab 01/03/19 1050  WBC 17.1*  CREATININE 1.20*    Estimated Creatinine Clearance: 44.8 mL/min (A) (by C-G formula based on SCr of 1.2 mg/dL (H)).    Allergies  Allergen Reactions  . Codeine Nausea Only and Other (See Comments)    hallucinations  . Hydrocodone Nausea Only and Other (See Comments)  . Macrodantin [Nitrofurantoin] Nausea And Vomiting    Antimicrobials this admission: 3/9 LVQ 750 x 1 3/9 CTX 1 gm x 1 3/9 meropenem>> Dose adjustments this admission:  Microbiology results: 3/9 Ucx 1753> 3/9 BCx2>> 3/9 Ucx 1050>>  Thank you for allowing pharmacy to be a part of this patient's care.  Eudelia Bunch, Pharm.D [phone] 01/03/2019 6:30 PM

## 2019-01-03 NOTE — H&P (Signed)
Subjective: CC: Right flank pain and chills.  Hx: [name] [name] is a 78 yo female who I was asked to see in consultation by Dr. Stark Jock for severe right flank pain and chills.   She has a right ureteral stent that was last changed in 11/19 by Dr. Tresa Moore for management of a chronic right ureteral stricture.  She had been feeling poorly this week but had the sudden onset of right flank pain this morning.  She has some hematuria which has increased and remains on  Eliquis for a history of atrial fibrillation.  She has no dysuria or increased irritative voiding symptoms.  She has had the chills but no fever.  ROS:  Review of Systems  Constitutional: Positive for chills and malaise/fatigue. Negative for fever.  Respiratory: Negative for shortness of breath.   Cardiovascular: Negative for chest pain and leg swelling.  Gastrointestinal: Positive for abdominal pain and nausea.  Genitourinary: Positive for hematuria.  All other systems reviewed and are negative.   Allergies  Allergen Reactions  . Codeine Nausea Only and Other (See Comments)    hallucinations  . Hydrocodone Nausea Only and Other (See Comments)  . Macrodantin [Nitrofurantoin] Nausea And Vomiting    Past Medical History:  Diagnosis Date  . Abdominal aortic atherosclerosis ([place]) 05/07/2016   Noted on CT abd/pelvis  . Anticoagulant long-term use    ELIQUIS  . Atrial fibrillation with RVR ([place])   . Chronic diastolic (congestive) heart failure ([place])   . Coronary artery disease   . Cystocele, midline    followed by dr c. Marvel Plan ([facility] [place])  pt uses pessary  . Diverticulosis 05/07/2016   Noted on CT abd/pelvis  . Dyspnea    w exertion d/t Afib per pt  . First degree heart block   . GERD (gastroesophageal reflux disease)   . Hematuria    intermittently d/t JJ stent  . Hiatal hernia   . History of arteriovenostomy for renal dialysis ([place])   . History of colon polyps   . History of gastric polyp    . History of kidney stones   . History of peptic ulcer disease   . History of septic shock    2011 (pt unaware) and 06-04-2013  w/ acute renal failure  . History of uterine cancer    STAGE I  --  S/P TAH W/ BSO  (NO OTHER TX)  . History of ventilator dependency ([place]) 2014  . Hydronephrosis, right 05/07/2016   Moderate, Noted on CT abd/pelvis  . Hyperlipidemia   . Hypertension   . Iron deficiency anemia hematology/ oncology--- dr Bobby Rumpf at [facility] in [place]--- per lov note 08-07-2017  stabilized and felt to be more chronic anemia   01/ 2018  dx iron def. anemia  s/p  IV Iron infusion and taking oral iron supplement  . PAF (paroxysmal atrial fibrillation) ([place]) DX JUNE 2012   CARDIOLOGIST--  DR Agustin Cree ([facility] , [place]--- that office has changed to [facility]))  CHADS2  . Presence of pessary    for midline pessary  . Ureteral stricture, right    has JJ stent  . Wears glasses     Past Surgical History:  Procedure Laterality Date  . CARDIOVERSION  09/ 2018   dr Agustin Cree   successful ([facility] )  . CHOLECYSTECTOMY  2010  . COLONOSCOPY W/ POLYPECTOMY  05/2017  . CYSTO/  BALLOON DILATION RIGHT URETERAL STRICTURE/ STENT PLACEMENT  06-02-2013  . CYSTO/  BILATERAL  RETROGRADE PYELOGRAM/ RIGHT URETEROSCOPY AND STENT PLACEMENT  10-04-2005  . CYSTO/ LEFT URETEROSCOPIC STONE EXTRACTION  04-03-2006  . CYSTOSCOPY W/ URETERAL STENT PLACEMENT Right 02/06/2014   Procedure: CYSTOSCOPY WITH RIGHT RETROGRADE PYELOGRAM RIGHT STENT REMOVAL and REPLACEMENT, Right Ureteroscopy;  Surgeon: Ailene Rud, MD;  Location: [facility];  Service: Urology;  Laterality: Right;  . CYSTOSCOPY W/ URETERAL STENT PLACEMENT Right 11/26/2016   Procedure: CYSTOSCOPY WITH RETROGRADE PYELOGRAM/URETERAL STENT REPLACEMENT;  Surgeon: Alexis Frock, MD;  Location: [facility];  Service: Urology;  Laterality: Right;  . CYSTOSCOPY W/ URETERAL  STENT PLACEMENT Right 04/24/2017   Procedure: CYSTOSCOPY WITH RETROGRADE PYELOGRAM/URETERAL STENT REPLACEMENT;  Surgeon: Alexis Frock, MD;  Location: [facility];  Service: Urology;  Laterality: Right;  . CYSTOSCOPY W/ URETERAL STENT PLACEMENT Right 10/07/2017   Procedure: CYSTOSCOPY WITH RETROGRADE PYELOGRAM/URETERAL STENT EXCHANGE;  Surgeon: Alexis Frock, MD;  Location: [facility];  Service: Urology;  Laterality: Right;  . CYSTOSCOPY W/ URETERAL STENT PLACEMENT Right 03/03/2018   Procedure: CYSTOSCOPY WITH RIGHT RETROGRADE / RIGHT URETERAL STENT EXCHANGE;  Surgeon: Alexis Frock, MD;  Location: [facility];  Service: Urology;  Laterality: Right;  . CYSTOSCOPY W/ URETERAL STENT PLACEMENT Right 09/22/2018   Procedure: CYSTOSCOPY WITH RETROGRADE PYELOGRAM/URETERAL STENT PLACEMENT;  Surgeon: Alexis Frock, MD;  Location: [facility];  Service: Urology;  Laterality: Right;  45 MINS  . CYSTOSCOPY WITH LITHOLAPAXY N/A 11/21/2013   Procedure: CYSTOSCOPY WITH LITHOLAPAXY;  Surgeon: Ailene Rud, MD;  Location: [facility];  Service: Urology;  Laterality: N/A;  . CYSTOSCOPY WITH RETROGRADE PYELOGRAM, URETEROSCOPY AND STENT PLACEMENT Bilateral 03/15/2014   Procedure: CYSTOSCOPY WITH BILATERAL RETROGRADE PYELOGRAM, RIGHT DIAGNOSTIC URETEROSCOPY AND STENT EXCHANGE;  Surgeon: Alexis Frock, MD;  Location: [facility];  Service: Urology;  Laterality: Bilateral;  . CYSTOSCOPY WITH STENT PLACEMENT Right 05/07/2016   Procedure: CYSTOSCOPY WITH RETROGRADE PYELOGRAM, URETERAL STENT PLACEMENT;  Surgeon: Franchot Gallo, MD;  Location: [facility];  Service: Urology;  Laterality: Right;  . HEMIARTHROPLASTY HIP Right 11/2016   fractured  . JOINT REPLACEMENT Right 11/2016   Hip Replacement  . LUMBAR FUSION  JULY 2013  . NEPHROLITHOTOMY  X2 YRS AGO  . PERCUTANEOUS NEPHROSTOMY  2014  . TOTAL ABDOMINAL HYSTERECTOMY W/ BILATERAL SALPINGOOPHORECTOMY  1989  .  TOTAL KNEE ARTHROPLASTY Bilateral 1992  &  1996  . TRANSTHORACIC ECHOCARDIOGRAM  07-24-2017   dr Agustin Cree   ef 60-65% (improved from last echo 2014, was 45-50%)/  trace TR/  mild AV sclerosis without stenosis  . URETEROSCOPY Right 11/21/2013   Procedure: URETEROSCOPY WITH STENT REMOVAL AND REPLACEMENT;  Surgeon: Ailene Rud, MD;  Location: [facility];  Service: Urology;  Laterality: Right;    Social History   Socioeconomic History  . Marital status: Married    Spouse name: Not on file  . Number of children: Not on file  . Years of education: Not on file  . Highest education level: Not on file  Occupational History  . Occupation: Retired from [employer]  . Financial resource strain: Not on file  . Food insecurity:    Worry: Not on file    Inability: Not on file  . Transportation needs:    Medical: Not on file    Non-medical: Not on file  Tobacco Use  . Smoking status: Never Smoker  . Smokeless tobacco: Never Used  Substance and Sexual Activity  . Alcohol use: No  . Drug use: No  .  Sexual activity: Not on file  Lifestyle  . Physical activity:    Days per week: Not on file    Minutes per session: Not on file  . Stress: Not on file  Relationships  . Social connections:    Talks on phone: Not on file    Gets together: Not on file    Attends religious service: Not on file    Active member of club or organization: Not on file    Attends meetings of clubs or organizations: Not on file    Relationship status: Not on file  . Intimate partner violence:    Fear of current or ex partner: Not on file    Emotionally abused: Not on file    Physically abused: Not on file    Forced sexual activity: Not on file  Other Topics Concern  . Not on file  Social History Narrative   Lives in [place] with husband. Drives, walks some, but not very active at home.     Family History  Problem Relation Age of Onset  . Heart disease Mother   . Heart  failure Mother   . Cancer Mother   . Heart disease Father   . Heart failure Father     Anti-infectives: Anti-infectives (From admission, onward)   Start     Dose/Rate Route Frequency Ordered Stop   01/03/19 1500  cefTRIAXone (ROCEPHIN) 1 g in sodium chloride 0.9 % 100 mL IVPB     1 g 200 mL/hr over 30 Minutes Intravenous Every 24 hours 01/03/19 1444     01/03/19 1345  levofloxacin (LEVAQUIN) IVPB 750 mg  Status:  Discontinued     750 mg 100 mL/hr over 90 Minutes Intravenous  Once 01/03/19 1332 01/03/19 1540      Current Facility-Administered Medications  Medication Dose Route Frequency Provider Last Rate Last Dose  . acetaminophen (TYLENOL) tablet 650 mg  650 mg Oral Q6H PRN Irene Pap N, DO      . amitriptyline (ELAVIL) tablet 25 mg  25 mg Oral QHS Hall, Carole N, DO      . carvedilol (COREG) tablet 3.125 mg  3.125 mg Oral BID WC Hall, Carole N, DO      . cefTRIAXone (ROCEPHIN) 1 g in sodium chloride 0.9 % 100 mL IVPB  1 g Intravenous Q24H Hall, Carole N, DO      . [START ON 01/04/2019] diltiazem (CARDIZEM CD) 24 hr capsule 120 mg  120 mg Oral q morning - 10a Hall, Carole N, DO      . ferrous sulfate tablet 325 mg  325 mg Oral BID Hall, Carole N, DO      . flecainide (TAMBOCOR) tablet 75 mg  75 mg Oral BID Irene Pap N, DO      . HYDROmorphone (DILAUDID) injection 0.5 mg  0.5 mg Intravenous Q4H PRN Irene Pap N, DO      . lactated ringers infusion   Intravenous Continuous Kayleen Memos, DO 75 mL/hr at 01/03/19 1543    . ondansetron (ZOFRAN) injection 4 mg  4 mg Intravenous Q6H PRN Kayleen Memos, DO      . [START ON 01/04/2019] pantoprazole (PROTONIX) EC tablet 40 mg  40 mg Oral Daily Hall, Carole N, DO      . polyethylene glycol (MIRALAX / GLYCOLAX) packet 17 g  17 g Oral Daily Hall, Carole N, DO      . senna-docusate (Senokot-S) tablet 2 tablet  2 tablet Oral BID Kayleen Memos,  DO         Objective: Vital signs in last 24 hours: Temp:  [97.9 F (36.6 C)-98.8 F (37.1  C)] 97.9 F (36.6 C) (03/09 1514) Pulse Rate:  [86-114] 94 (03/09 1514) Resp:  [17-18] 17 (03/09 1514) BP: (106-125)/(59-73) 106/59 (03/09 1514) SpO2:  [85 %-95 %] 94 % (03/09 1514) Weight:  [85.7 kg] 85.7 kg (03/09 1521)  Intake/Output from previous day: No intake/output data recorded. Intake/Output this shift: Total I/O In: 149.9 [I.V.:32.8; IV Piggyback:117.1] Out: -    Physical Exam Vitals signs reviewed.  Constitutional:      Appearance: She is well-developed.  HENT:     Head: Normocephalic and atraumatic.  Neck:     Musculoskeletal: Normal range of motion.  Cardiovascular:     Rate and Rhythm: Normal rate and regular rhythm.     Heart sounds: Normal heart sounds.  Pulmonary:     Effort: Pulmonary effort is normal. No respiratory distress.     Breath sounds: Normal breath sounds.  Abdominal:     Palpations: Abdomen is soft.     Tenderness: There is abdominal tenderness (RUQ and flank).  Musculoskeletal:     Right lower leg: She exhibits no tenderness. No edema.     Left lower leg: She exhibits no tenderness. No edema.  Skin:    General: Skin is warm and dry.  Neurological:     General: No focal deficit present.     Mental Status: She is alert and oriented to person, place, and time.  Psychiatric:        Mood and Affect: Mood normal.        Behavior: Behavior normal.     Lab Results:  Recent Labs    01/03/19 1050  WBC 17.1*  HGB 15.9*  HCT 52.0*  PLT 653*   BMET Recent Labs    01/03/19 1050  NA 135  K 3.7  CL 107  CO2 16*  GLUCOSE 146*  BUN 22  CREATININE 1.20*  CALCIUM 8.9   PT/INR No results for input(s): LABPROT, INR in the last 72 hours. ABG No results for input(s): PHART, HCO3 in the last 72 hours.  Invalid input(s): PCO2, PO2  Studies/Results: Dg Chest 2 View  Result Date: 01/03/2019 CLINICAL DATA:  Acute onset severe right side pain and shortness of breath at 4 a.m. this morning. EXAM: CHEST - 2 VIEW COMPARISON:  PA and lateral  chest 07/28/2017. Single-view of the chest 05/08/2015. FINDINGS: The lungs are clear. Heart size is normal. There is no pneumothorax or pleural effusion. Aortic atherosclerosis is noted. No acute bony abnormality. Remote compression fracture in the upper lumbar spine is seen on the 2018 exam. IMPRESSION: No acute disease. Atherosclerosis. Electronically Signed   By: Inge Rise M.D.   On: 01/03/2019 10:17   Ct Abdomen Pelvis W Contrast  Result Date: 01/03/2019 CLINICAL DATA:  Acute onset right side abdominal pain and shortness of breath at 4 a.m. today. EXAM: CT ABDOMEN AND PELVIS WITH CONTRAST TECHNIQUE: Multidetector CT imaging of the abdomen and pelvis was performed using the standard protocol following bolus administration of intravenous contrast. CONTRAST:  80 mL ISOVUE-300 IOPAMIDOL (ISOVUE-300) INJECTION 61% COMPARISON:  CT abdomen and pelvis 05/07/2016. FINDINGS: Lower chest: There is mild cardiomegaly. Dependent atelectasis is greater on the right. No pleural or pericardial effusion. Hepatobiliary: Status post cholecystectomy. No focal lesion. Minimal intrahepatic biliary ductal dilatation is likely related to cholecystectomy. Pancreas: Unremarkable. No pancreatic ductal dilatation or surrounding inflammatory changes. Spleen: Normal  in size without focal abnormality. Adrenals/Urinary Tract: The patient has a right double-J ureteral stent in place which is new since the prior CT. The stent projects in good position but there is moderate to moderately severe right hydronephrosis which appears unchanged since the prior examination. Extensive stranding is present about the right kidney and proximal ureter and worse than on the prior study. Left renal cyst is unchanged. The kidneys and ureters are otherwise unremarkable. The urinary bladder is almost completely decompressed. The adrenal glands appear normal. Stomach/Bowel: Mild sigmoid diverticulosis without diverticulitis is noted. The colon is  otherwise normal in appearance. The appendix is not seen but no evidence of appendicitis is identified. The stomach and small bowel appear normal. Vascular/Lymphatic: Aortic atherosclerosis. No enlarged abdominal or pelvic lymph nodes. Reproductive: Status post hysterectomy. No adnexal masses. Pessary device noted. Other: None. Musculoskeletal: The patient is status post L3-5 fusion. Remote L2 compression fracture is unchanged. No acute or new bony abnormality is identified. IMPRESSION: Moderate to moderately severe right hydronephrosis does not appear markedly changed compared to the prior examination with a right double-J ureteral stent in good position. Extensive stranding about the right kidney and proximal ureter is worse than on the prior exam and worrisome for urinary tract infection/ureteritis. No CT signs of pyelonephritis. No abscess. Atherosclerosis. Mild sigmoid diverticulosis without diverticulitis. Electronically Signed   By: Inge Rise M.D.   On: 01/03/2019 12:49     Assessment: Obstructed right stent with pain and possible infection.   She has been admitted to medicine for antibiotic therapy and I will take her to the OR today for cystoscopy with  right stent change.  I have reviewed the risks of bleeding, infection, ureteral injury, stent encrustation, need for secondary procedures, thrombotic events and anesthetic complications.     CC: Dr. Trish Mage and Dr. Phebe Colla.      Irine Seal 01/03/2019 [phone]

## 2019-01-03 NOTE — ED Triage Notes (Signed)
Pt complaint of severe right side pain and SOB onset 0400 this morning. Concerned that it is related to flank pain/stent; hx of same. Positive for n/v.

## 2019-01-03 NOTE — ED Provider Notes (Signed)
[facility] DEPT Provider Note   CSN: [id number] Arrival date & time: 01/03/19  9233    History   Chief Complaint Chief Complaint  Patient presents with  . Shortness of Breath  . Flank Pain    HPI [name] [name] is a 78 y.o. female.     Patient is a 78 year old female with history of renal calculi with ureteral scarring requiring intermittent changing of ureteral stents.  She presents today with complaints of severe pain in her right upper quadrant and right flank that began early this morning.  She denies any fevers or chills.  She denies any dysuria or hematuria.  She denies any bowel complaints.  Her pain is worse with breathing and movement, but denies shortness of breath.  The history is provided by the patient.  Shortness of Breath  Severity:  Severe Onset quality:  Sudden Duration:  6 hours Timing:  Constant Progression:  Worsening Chronicity:  New Relieved by:  Nothing Worsened by:  Deep breathing and movement Ineffective treatments:  None tried Associated symptoms: no cough, no fever and no sputum production     Past Medical History:  Diagnosis Date  . Abdominal aortic atherosclerosis ([place]) 05/07/2016   Noted on CT abd/pelvis  . Anticoagulant long-term use    ELIQUIS  . Atrial fibrillation with RVR ([place])   . Chronic diastolic (congestive) heart failure ([place])   . Coronary artery disease   . Cystocele, midline    followed by dr c. Marvel Plan ([facility] [place])  pt uses pessary  . Diverticulosis 05/07/2016   Noted on CT abd/pelvis  . Dyspnea    w exertion d/t Afib per pt  . First degree heart block   . GERD (gastroesophageal reflux disease)   . Hematuria    intermittently d/t JJ stent  . Hiatal hernia   . History of arteriovenostomy for renal dialysis ([place])   . History of colon polyps   . History of gastric polyp   . History of kidney stones   . History of peptic ulcer disease   . History of  septic shock    2011 (pt unaware) and 06-04-2013  w/ acute renal failure  . History of uterine cancer    STAGE I  --  S/P TAH W/ BSO  (NO OTHER TX)  . History of ventilator dependency ([place]) 2014  . Hydronephrosis, right 05/07/2016   Moderate, Noted on CT abd/pelvis  . Hyperlipidemia   . Hypertension   . Iron deficiency anemia hematology/ oncology--- dr Bobby Rumpf at [facility] in [place]--- per lov note 08-07-2017  stabilized and felt to be more chronic anemia   01/ 2018  dx iron def. anemia  s/p  IV Iron infusion and taking oral iron supplement  . PAF (paroxysmal atrial fibrillation) ([place]) DX JUNE 2012   CARDIOLOGIST--  DR Agustin Cree ([facility] , [place]--- that office has changed to [facility]))  CHADS2  . Presence of pessary    for midline pessary  . Ureteral stricture, right    has JJ stent  . Wears glasses     Patient Active Problem List   Diagnosis Date Noted  . Paroxysmal atrial fibrillation ([place]) 08/04/2017  . Chronic anticoagulation 08/04/2017  . Coronary artery disease 06/16/2017  . Encounter for monitoring flecainide therapy 07/30/2016  . Ureteral stricture, right 05/07/2016  . Sepsis ([place]) 05/07/2016  . Hydronephrosis 05/07/2016  . Congenital vaginal enterocele 04/15/2016  . Female genital prolapse 04/15/2016  .  High risk medication use 04/15/2016  . Hyperlipemia 04/15/2016  . Midline cystocele 04/15/2016  . Palpitations 04/15/2016  . Tendonitis, Achilles 04/15/2016  . Chronic diastolic congestive heart failure ([place]) 05/31/2015  . Dyslipidemia 05/31/2015  . Diarrhea 06/11/2013  . E coli bacteremia 06/08/2013  . Anemia, unspecified 06/08/2013  . Atrial fibrillation with rapid ventricular response ([place]) 06/07/2013  . NSTEMI, initial episode of care ([place]) 06/07/2013  . Septic shock(785.52) 06/04/2013  . Acute respiratory failure with hypoxia ([place]) 06/04/2013  . Pyelonephritis 06/04/2013  . Ureteral obstruction, right  06/04/2013  . Acute renal failure ([place]) 06/04/2013  . Lactic acidosis 06/04/2013    Past Surgical History:  Procedure Laterality Date  . CARDIOVERSION  09/ 2018   dr Agustin Cree   successful ([facility] )  . CHOLECYSTECTOMY  2010  . COLONOSCOPY W/ POLYPECTOMY  05/2017  . CYSTO/  BALLOON DILATION RIGHT URETERAL STRICTURE/ STENT PLACEMENT  06-02-2013  . CYSTO/  BILATERAL RETROGRADE PYELOGRAM/ RIGHT URETEROSCOPY AND STENT PLACEMENT  10-04-2005  . CYSTO/ LEFT URETEROSCOPIC STONE EXTRACTION  04-03-2006  . CYSTOSCOPY W/ URETERAL STENT PLACEMENT Right 02/06/2014   Procedure: CYSTOSCOPY WITH RIGHT RETROGRADE PYELOGRAM RIGHT STENT REMOVAL and REPLACEMENT, Right Ureteroscopy;  Surgeon: Ailene Rud, MD;  Location: [facility];  Service: Urology;  Laterality: Right;  . CYSTOSCOPY W/ URETERAL STENT PLACEMENT Right 11/26/2016   Procedure: CYSTOSCOPY WITH RETROGRADE PYELOGRAM/URETERAL STENT REPLACEMENT;  Surgeon: Alexis Frock, MD;  Location: [facility];  Service: Urology;  Laterality: Right;  . CYSTOSCOPY W/ URETERAL STENT PLACEMENT Right 04/24/2017   Procedure: CYSTOSCOPY WITH RETROGRADE PYELOGRAM/URETERAL STENT REPLACEMENT;  Surgeon: Alexis Frock, MD;  Location: [facility];  Service: Urology;  Laterality: Right;  . CYSTOSCOPY W/ URETERAL STENT PLACEMENT Right 10/07/2017   Procedure: CYSTOSCOPY WITH RETROGRADE PYELOGRAM/URETERAL STENT EXCHANGE;  Surgeon: Alexis Frock, MD;  Location: [facility];  Service: Urology;  Laterality: Right;  . CYSTOSCOPY W/ URETERAL STENT PLACEMENT Right 03/03/2018   Procedure: CYSTOSCOPY WITH RIGHT RETROGRADE / RIGHT URETERAL STENT EXCHANGE;  Surgeon: Alexis Frock, MD;  Location: [facility];  Service: Urology;  Laterality: Right;  . CYSTOSCOPY W/ URETERAL STENT PLACEMENT Right 09/22/2018   Procedure: CYSTOSCOPY WITH RETROGRADE PYELOGRAM/URETERAL STENT PLACEMENT;  Surgeon: Alexis Frock, MD;  Location: [facility];  Service: Urology;  Laterality: Right;  45 MINS  . CYSTOSCOPY WITH LITHOLAPAXY N/A 11/21/2013   Procedure: CYSTOSCOPY WITH LITHOLAPAXY;  Surgeon: Ailene Rud, MD;  Location[facility];  Service: Urology;  Laterality: N/A;  . CYSTOSCOPY WITH RETROGRADE PYELOGRAM, URETEROSCOPY AND STENT PLACEMENT Bilateral 03/15/2014   Procedure: CYSTOSCOPY WITH BILATERAL RETROGRADE PYELOGRAM, RIGHT DIAGNOSTIC URETEROSCOPY AND STENT EXCHANGE;  Surgeon: Alexis Frock, MD;  Location: [facility];  Service: Urology;  Laterality: Bilateral;  . CYSTOSCOPY WITH STENT PLACEMENT Right 05/07/2016   Procedure: CYSTOSCOPY WITH RETROGRADE PYELOGRAM, URETERAL STENT PLACEMENT;  Surgeon: Franchot Gallo, MD;  Location: [facility];  Service: Urology;  Laterality: Right;  . HEMIARTHROPLASTY HIP Right 11/2016   fractured  . JOINT REPLACEMENT Right 11/2016   Hip Replacement  . LUMBAR FUSION  JULY 2013  . NEPHROLITHOTOMY  X2 YRS AGO  . PERCUTANEOUS NEPHROSTOMY  2014  . TOTAL ABDOMINAL HYSTERECTOMY W/ BILATERAL SALPINGOOPHORECTOMY  1989  . TOTAL KNEE ARTHROPLASTY Bilateral 1992  &  1996  . TRANSTHORACIC ECHOCARDIOGRAM  07-24-2017   dr Agustin Cree   ef 60-65% (improved from last echo 2014, was 45-50%)/  trace TR/  mild AV sclerosis without stenosis  . URETEROSCOPY  Right 11/21/2013   Procedure: URETEROSCOPY WITH STENT REMOVAL AND REPLACEMENT;  Surgeon: Ailene Rud, MD;  Location: [facility];  Service: Urology;  Laterality: Right;     OB History   No obstetric history on file.      Home Medications    Prior to Admission medications   Medication Sig Start Date End Date Taking? Authorizing Provider  acetaminophen (TYLENOL) 500 MG tablet Take 1,000 mg by mouth 2 (two) times daily as needed for moderate pain or headache.    [provider]  amitriptyline (ELAVIL) 25 MG tablet Take 25 mg by mouth at bedtime.    [provider]  carvedilol (COREG) 6.25 MG  tablet TAKE 1/2 TABLET BY MOUTH TWICE DAILY 02/22/18   Park Liter, MD  diltiazem (CARDIZEM CD) 120 MG 24 hr capsule Take 1 capsule (120 mg total) by mouth every morning. Patient taking differently: Take 120 mg by mouth every morning.  07/28/17   Park Liter, MD  ELIQUIS 5 MG TABS tablet Take 1 tablet (5 mg total) by mouth 2 (two) times daily. 12/29/18   Park Liter, MD  ferrous sulfate 325 (65 FE) MG EC tablet Take 325 mg by mouth 2 (two) times daily.     [provider]  flecainide (TAMBOCOR) 50 MG tablet Take 1.5 tablets (75 mg total) by mouth 2 (two) times daily. 11/08/18   Park Liter, MD  furosemide (LASIX) 40 MG tablet Take 40 mg by mouth daily as needed for fluid.     [provider]  omeprazole (PRILOSEC) 20 MG capsule Take 20 mg by mouth every morning.     [provider]  potassium chloride (K-DUR) 10 MEQ tablet Take 1 tablet (10 mEq total) by mouth as needed. When you take Lasix. Patient taking differently: Take 10 mEq by mouth as needed (take when taking lasix prn). When you take Lasix. 08/04/17 10/06/19  Park Liter, MD    Family History Family History  Problem Relation Age of Onset  . Heart disease Mother   . Heart failure Mother   . Cancer Mother   . Heart disease Father   . Heart failure Father     Social History Social History   Tobacco Use  . Smoking status: Never Smoker  . Smokeless tobacco: Never Used  Substance Use Topics  . Alcohol use: No  . Drug use: No     Allergies   Codeine; Hydrocodone; and Macrodantin [nitrofurantoin]   Review of Systems Review of Systems  Constitutional: Negative for fever.  Respiratory: Negative for cough and sputum production.   All other systems reviewed and are negative.    Physical Exam Updated Vital Signs BP 123/70 (BP Location: Right Arm)   Pulse (!) 114   Temp 98.8 F (37.1 C) (Oral)   Resp 18   SpO2 94%   Physical Exam Vitals signs and nursing  note reviewed.  Constitutional:      General: She is not in acute distress.    Appearance: She is well-developed. She is not diaphoretic.  HENT:     Head: Normocephalic and atraumatic.  Neck:     Musculoskeletal: Normal range of motion and neck supple.  Cardiovascular:     Rate and Rhythm: Normal rate and regular rhythm.     Heart sounds: No murmur. No friction rub. No gallop.   Pulmonary:     Effort: Pulmonary effort is normal. No respiratory distress.     Breath  sounds: Normal breath sounds. No wheezing.  Abdominal:     General: Bowel sounds are normal. There is no distension.     Palpations: Abdomen is soft.     Tenderness: There is abdominal tenderness. There is no guarding or rebound.     Comments: There is tenderness to palpation in the right upper quadrant and right flank.  There is no rebound or guarding.  Musculoskeletal: Normal range of motion.     Right lower leg: She exhibits no tenderness. No edema.     Left lower leg: She exhibits no tenderness. No edema.  Skin:    General: Skin is warm and dry.  Neurological:     Mental Status: She is alert and oriented to person, place, and time.      ED Treatments / Results  Labs (all labs ordered are listed, but only abnormal results are displayed) Labs Reviewed  URINALYSIS, ROUTINE W REFLEX MICROSCOPIC  BASIC METABOLIC PANEL  CBC  LIPASE, BLOOD  I-STAT TROPONIN, ED    EKG None  Radiology Dg Chest 2 View  Result Date: 01/03/2019 CLINICAL DATA:  Acute onset severe right side pain and shortness of breath at 4 a.m. this morning. EXAM: CHEST - 2 VIEW COMPARISON:  PA and lateral chest 07/28/2017. Single-view of the chest 05/08/2015. FINDINGS: The lungs are clear. Heart size is normal. There is no pneumothorax or pleural effusion. Aortic atherosclerosis is noted. No acute bony abnormality. Remote compression fracture in the upper lumbar spine is seen on the 2018 exam. IMPRESSION: No acute disease. Atherosclerosis.  Electronically Signed   By: Inge Rise M.D.   On: 01/03/2019 10:17    Procedures Procedures (including critical care time)  Medications Ordered in ED Medications  morphine 4 MG/ML injection 4 mg (has no administration in time range)  ondansetron (ZOFRAN) injection 4 mg (has no administration in time range)     Initial Impression / Assessment and Plan / ED Course  I have reviewed the triage vital signs and the nursing notes.  Pertinent labs & imaging results that were available during my care of the patient were reviewed by me and considered in my medical decision making (see chart for details).  Patient presenting with complaints of severe right flank pain.  She has a history of ureteral scarring/stenosis and has renal stent placed regularly by Dr. Bess Harvest.  Work-up today shows swelling and inflammation surrounding the proximal ureter and kidney.  Urinalysis also shows possible UA versus colonization.  These findings were discussed with [name] from urology.  He is recommending antibiotics and having the patient admitted to the hospitalist service.  Patient may require change of a stent.  I have spoken with the hospitalist who agrees to admit.  Final Clinical Impressions(s) / ED Diagnoses   Final diagnoses:  None    ED Discharge Orders    None       Veryl Speak, MD 01/04/19 [phone]

## 2019-01-03 NOTE — Anesthesia Preprocedure Evaluation (Signed)
Anesthesia Evaluation  Patient identified by MRN, date of birth, ID band Patient awake    Reviewed: Allergy & Precautions, NPO status , Patient's Chart, lab work & pertinent test results  Airway Mallampati: III  TM Distance: >3 FB Neck ROM: Full  Mouth opening: Limited Mouth Opening  Dental no notable dental hx. (+) Teeth Intact, Dental Advisory Given   Pulmonary neg pulmonary ROS,    Pulmonary exam normal breath sounds clear to auscultation       Cardiovascular hypertension, Pt. on medications and Pt. on home beta blockers + CAD, + Past MI and +CHF  Normal cardiovascular exam+ dysrhythmias Atrial Fibrillation  Rhythm:Regular Rate:Normal  TTE 2018 EF 55-60%, no valvular abnormalities   Neuro/Psych negative neurological ROS  negative psych ROS   GI/Hepatic Neg liver ROS, GERD  Medicated and Controlled,  Endo/Other  negative endocrine ROS  Renal/GU   negative genitourinary   Musculoskeletal negative musculoskeletal ROS (+)   Abdominal   Peds negative pediatric ROS (+)  Hematology  (+) Blood dyscrasia, anemia ,   Anesthesia Other Findings   Reproductive/Obstetrics negative OB ROS                             Anesthesia Physical  Anesthesia Plan  ASA: III and emergent  Anesthesia Plan: General   Post-op Pain Management:    Induction: Intravenous  PONV Risk Score and Plan: 3 and Ondansetron, Dexamethasone and Treatment may vary due to age or medical condition  Airway Management Planned: LMA  Additional Equipment:   Intra-op Plan:   Post-operative Plan: Extubation in OR  Informed Consent: I have reviewed the patients History and Physical, chart, labs and discussed the procedure including the risks, benefits and alternatives for the proposed anesthesia with the patient or authorized representative who has indicated his/her understanding and acceptance.     Dental advisory  given  Plan Discussed with: CRNA  Anesthesia Plan Comments:         Anesthesia Quick Evaluation

## 2019-01-03 NOTE — Transfer of Care (Signed)
Immediate Anesthesia Transfer of Care Note  Patient: [name] [name]  Procedure(s) Performed: Procedure(s): CYSTOSCOPY WITH RETROGRADE PYELOGRAM RIGHT WITH STENT EXCHANGE (Right)  Patient Location: PACU  Anesthesia Type:General  Level of Consciousness:  sedated, patient cooperative and responds to stimulation  Airway & Oxygen Therapy:Patient Spontanous Breathing and Patient connected to face mask oxgen  Post-op Assessment:  Report given to PACU RN and Post -op Vital signs reviewed and stable  Post vital signs:  Reviewed and stable  Last Vitals:  Vitals:   01/03/19 1514 01/03/19 1707  BP: (!) 106/59 (!) 100/42  Pulse: 94 87  Resp: 17 20  Temp: 36.6 C 37.1 C  SpO2: 80% 22%    Complications: No apparent anesthesia complications

## 2019-01-03 NOTE — Progress Notes (Signed)
ANTICOAGULATION CONSULT NOTE - Initial Consult  Pharmacy Consult for Heparin (on Eliquis PTA) Indication: atrial fibrillation  Allergies  Allergen Reactions  . Codeine Nausea Only and Other (See Comments)    hallucinations  . Hydrocodone Nausea Only and Other (See Comments)  . Macrodantin [Nitrofurantoin] Nausea And Vomiting    Patient Measurements: Height: 5\' 7"  (170.2 cm) Weight: 189 lb (85.7 kg) IBW/kg (Calculated) : 61.6 Heparin Dosing Weight: 79kg  Vital Signs: Temp: 97.9 F (36.6 C) (03/09 1514) Temp Source: Oral (03/09 1514) BP: 106/59 (03/09 1514) Pulse Rate: 94 (03/09 1514)  Labs: Recent Labs    01/03/19 1050 01/03/19 1544  HGB 15.9*  --   HCT 52.0*  --   PLT 653*  --   APTT  --  35  CREATININE 1.20*  --   TROPONINI <0.03  --     Estimated Creatinine Clearance: 44.8 mL/min (A) (by C-G formula based on SCr of 1.2 mg/dL (H)).   Medical History: Past Medical History:  Diagnosis Date  . Abdominal aortic atherosclerosis ([place]) 05/07/2016   Noted on CT abd/pelvis  . Anticoagulant long-term use    ELIQUIS  . Atrial fibrillation with RVR ([place])   . Chronic diastolic (congestive) heart failure ([place])   . Coronary artery disease   . Cystocele, midline    followed by dr c. Marvel Plan ([facility] [place])  pt uses pessary  . Diverticulosis 05/07/2016   Noted on CT abd/pelvis  . Dyspnea    w exertion d/t Afib per pt  . First degree heart block   . GERD (gastroesophageal reflux disease)   . Hematuria    intermittently d/t JJ stent  . Hiatal hernia   . History of arteriovenostomy for renal dialysis ([place])   . History of colon polyps   . History of gastric polyp   . History of kidney stones   . History of peptic ulcer disease   . History of septic shock    2011 (pt unaware) and 06-04-2013  w/ acute renal failure  . History of uterine cancer    STAGE I  --  S/P TAH W/ BSO  (NO OTHER TX)  . History of ventilator dependency ([place]) 2014   . Hydronephrosis, right 05/07/2016   Moderate, Noted on CT abd/pelvis  . Hyperlipidemia   . Hypertension   . Iron deficiency anemia hematology/ oncology--- dr Bobby Rumpf at [facility] in [place]--- per lov note 08-07-2017  stabilized and felt to be more chronic anemia   01/ 2018  dx iron def. anemia  s/p  IV Iron infusion and taking oral iron supplement  . PAF (paroxysmal atrial fibrillation) ([place]) DX JUNE 2012   CARDIOLOGIST--  DR Agustin Cree ([facility] , [place]--- that office has changed to [facility]))  CHADS2  . Presence of pessary    for midline pessary  . Ureteral stricture, right    has JJ stent  . Wears glasses     Medications:  PTA Eliquis 5mg  BID - LD 3/9 @ 0800  Assessment:  60yr female admitted with severe right flank pain  PMH significant for AFib (on Eliquis PTA), dCHF, recurrent ureteral obstruction s/p stent replacement every 5 months.  Admitted with hydronephrosis with ureteral stent, sepsis due to complicated UTI with suspected pyelonephritis  Eliquis held on admission pending surgical intervention  Pharmacy consulted to dose IV heparin while Eliquis on hold  Due to falsely elevated effects on heparin levels by Eliquis, will initially monitor IV heparin infusion  via aPTT until effects of Eliquis have worn off and heparin level and aPTT values correlate  Goal of Therapy:  Heparin level 0.3-0.7 units/ml aPTT 66-102 seconds Monitor platelets by anticoagulation protocol: Yes   Plan:   Obtain baseline aPTT and heparin level  At 20:00 begin IV heparin infusion @ 1150 units/hr (no bolus)  F/U aPTT and heparin level 8 hr after heparin started  Monitor heparin level and CBC daily while on IV heparin  Ellesse Antenucci, Toribio Harbour, PharmD 01/03/2019,4:36 PM

## 2019-01-03 NOTE — Op Note (Signed)
Procedure: Cystoscopy with right ureteroscopy with holmium laser application for removal of stone encrusted right ureteral stent.  Preop diagnosis: Obstructed retained right ureteral stent.  Postop diagnosis: Obstructed, encrusted retained right ureteral stent with infection.  Surgeon: Dr. Irine Seal.  Anesthesia: General.  Specimen: Urine from right upper tract for culture.  Drain: 8 [place] by 24 cm Polaris right double-J stent and 16 French Foley catheter.  EBL: Minimal.  Complications none.  Indications: The patient is a 78 year old female with a history of chronic right ureteral stricture disease.  She has been managed by chronic indwelling ureteral stents with the last stent exchange by Dr. Tresa Moore in November 2019.  She presented to the emergency room today with a week of feeling poorly followed by the sudden onset of right flank pain and a CT scan demonstrated a markedly hydronephrotic right renal collecting system with a stent in good position in the bladder decompressed.  It was felt that emergent stent exchange was indicated because of a suggestion of infection in her urine and her symptoms.  Procedure: She was on Rocephin preoperatively.  She was taken operating room where general anesthetic was induced.  She was placed in lithotomy position and fitted with PAS hose.  Her perineum and genitalia were prepped with Betadine solution she was draped in usual sterile fashion.  Cystoscopy was performed in a 50 [place] scope and the 30 degree lens.  Examination revealed a normal urethra.  There was a stent at the right ureteral orifice with some encrustation.  Left ureteral orifice was unremarkable the remainder of the bladder wall had no significant abnormalities.  The stent was grasped with a grasping.  Forceps and pulled the urethral meatus.  The stent loops were cut off with scissors but an attempt to pass a wire up the stent lumen was unsuccessful due to encrustation.  The stent was  pulled down and came out approximately 10 cm beyond the urethral meatus but hung up at the level of the mid ureter.  I then replaced the cystoscope and passed a wire beside the stent to the kidney.  The wire went easily but an attempt to pass a 5 [place] open-ended catheter over the wire was unsuccessful due to obstruction at the level of the proximal stent coil.  I then passed the 4.5 [place] short semirigid ureteroscope alongside the wire and stent up to the level of the proximal coil which was heavily encrusted.  I was unable to dislodge the encrustation with the tip of the ureteroscope and since she had recently been on Eliquis and would be unable to have a percutaneous nephrostomy tube placed I felt that I had to fragment the stones to remove the stent to allow placement of a new stent.  I was able to aspirate bloody purulent urine through the ureteroscopeto partially decompress the system.  The 200 m tract tip laser fiber was passed through the ureteroscope.  The power was set on 0.5 J and the frequency on 10 Hz.  The stone was then fragmented using is minimal irrigation as possible.  I was able to gradually peel the stone off of the stent which was then removed.  Because of the presence of probable infection, I removed the ureteroscope and reinserted the cystoscope over the wire and passed a fresh 8 [place] by 24 cm Polaris stent to the kidney under fluoroscopic guidance.  The wire was removed leaving good coil in the kidney and the stent loops in the bladder.    The  cystoscope was removed and a 16 French Foley catheter was inserted.  The balloon was filled with 10 mL of sterile fluid and the catheter was placed to straight drainage.  She was taken down from lithotomy position, her anesthetic was reversed and she was moved to recovery in stable condition.  There were no complications.

## 2019-01-03 NOTE — Progress Notes (Addendum)
PHARMACY - ANTICOAGULATION (brief note) Since completion of previous IV heparin note, urology has seen patient and plans noted for patient to go to OR today @ 17:00.  Contacted Dr Nevada Crane with surgical plans.  Received orders to d/c IV heparin orders.  Await post-op orders/recommendations from urology re: resuming Eliquis vs bridging with IV heparin  Leone Haven, PharmD  UPDATE: Ronald Pippins RN, called to notify pharmacy that she spoke with Dr Jeffie Pollock regarding patient's anticoagulation post-op.  Dr Jeffie Pollock authorized resuming pt's home anticoagulation medication post-op tonight.  Will place order for Eliquis 5mg  BID to resume @ 22:00  Thank you for allowing pharmacy to be a part of this patient's care.  Leone Haven, PharmD 01/03/19 @ 15:07

## 2019-01-03 NOTE — H&P (Addendum)
History and Physical  [name] [name] HQI:[id number] DOB: [birth year] DOA: 01/03/2019  Referring physician: Dr Stark Jock PCP: [name], MD  Outpatient Specialists: Urology Dr Tresa Moore Patient coming from: Home  Chief Complaint: Severe right flank pain  HPI: [name] [name] is a 78 y.o. female with medical history significant for paroxysmal A. fib on Eliquis, chronic diastolic CHF, recurrent ureteral obstruction post ureteral stent placement every 5 months, hyperlipidemia, GERD who presented to [facility] ED with complaints of sudden onset severe right flank pain that started earlier this morning around 4 AM.  Associated with chills.  Last visit with her urologist was 2-3 weeks ago.  She was then treated with Bactrim and completed her course of antibiotic for UTI.  No other changes to her medications.  States has been feeling poorly in the last 3 days and suddenly started having right flank pain early this morning.  No other complaints.  ED Course: Upon presentation to the ED, vital signs are remarkable for tachycardia with rate of 114.  Lab studies remarkable for leukocytosis with WBC 17 K and urine analysis positive for pyuria.  CT abdomen pelvis with contrast done on 01/03/2019 reveals moderate to moderately severe right hydronephrosis with a right double-J ureteral stent in good position and extensive stranding about the right kidney and proximal ureter worse than on the prior exam and worrisome for urinary tract infection/urethritis.  ED physician consulted urology.  TRH asked to admit.  Review of Systems: Review of systems as noted in the HPI. All other systems reviewed and are negative.   Past Medical History:  Diagnosis Date  . Abdominal aortic atherosclerosis ([place]) 05/07/2016   Noted on CT abd/pelvis  . Anticoagulant long-term use    ELIQUIS  . Atrial fibrillation with RVR ([place])   . Chronic diastolic (congestive) heart failure ([place])   . Coronary artery disease   . Cystocele, midline    followed by dr c. Marvel Plan ([facility] [place])  pt uses pessary  . Diverticulosis 05/07/2016   Noted on CT abd/pelvis  . Dyspnea    w exertion d/t Afib per pt  . First degree heart block   . GERD (gastroesophageal reflux disease)   . Hematuria    intermittently d/t JJ stent  . Hiatal hernia   . History of arteriovenostomy for renal dialysis ([place])   . History of colon polyps   . History of gastric polyp   . History of kidney stones   . History of peptic ulcer disease   . History of septic shock    2011 (pt unaware) and 06-04-2013  w/ acute renal failure  . History of uterine cancer    STAGE I  --  S/P TAH W/ BSO  (NO OTHER TX)  . History of ventilator dependency ([place]) 2014  . Hydronephrosis, right 05/07/2016   Moderate, Noted on CT abd/pelvis  . Hyperlipidemia   . Hypertension   . Iron deficiency anemia hematology/ oncology--- dr Bobby Rumpf at [facility] in [place]--- per lov note 08-07-2017  stabilized and felt to be more chronic anemia   01/ 2018  dx iron def. anemia  s/p  IV Iron infusion and taking oral iron supplement  . PAF (paroxysmal atrial fibrillation) ([place]) DX JUNE 2012   CARDIOLOGIST--  DR Agustin Cree ([facility] , [place]--- that office has changed to [facility]))  CHADS2  . Presence of pessary    for midline pessary  . Ureteral stricture, right    has JJ  stent  . Wears glasses    Past Surgical History:  Procedure Laterality Date  . CARDIOVERSION  09/ 2018   dr Agustin Cree   successful ([facility] )  . CHOLECYSTECTOMY  2010  . COLONOSCOPY W/ POLYPECTOMY  05/2017  . CYSTO/  BALLOON DILATION RIGHT URETERAL STRICTURE/ STENT PLACEMENT  06-02-2013  . CYSTO/  BILATERAL RETROGRADE PYELOGRAM/ RIGHT URETEROSCOPY AND STENT PLACEMENT  10-04-2005  . CYSTO/ LEFT URETEROSCOPIC STONE EXTRACTION  04-03-2006  . CYSTOSCOPY W/ URETERAL STENT PLACEMENT Right 02/06/2014   Procedure: CYSTOSCOPY WITH RIGHT RETROGRADE PYELOGRAM  RIGHT STENT REMOVAL and REPLACEMENT, Right Ureteroscopy;  Surgeon: Ailene Rud, MD;  [facility]: [facility];  Service: Urology;  Laterality: Right;  . CYSTOSCOPY W/ URETERAL STENT PLACEMENT Right 11/26/2016   Procedure: CYSTOSCOPY WITH RETROGRADE PYELOGRAM/URETERAL STENT REPLACEMENT;  Surgeon: Alexis Frock, MD;  Location: [facility];  Service: Urology;  Laterality: Right;  . CYSTOSCOPY W/ URETERAL STENT PLACEMENT Right 04/24/2017   Procedure: CYSTOSCOPY WITH RETROGRADE PYELOGRAM/URETERAL STENT REPLACEMENT;  Surgeon: Alexis Frock, MD;  Location[facility];  Service: Urology;  Laterality: Right;  . CYSTOSCOPY W/ URETERAL STENT PLACEMENT Right 10/07/2017   Procedure: CYSTOSCOPY WITH RETROGRADE PYELOGRAM/URETERAL STENT EXCHANGE;  Surgeon: Alexis Frock, MD;  Location: [facility];  Service: Urology;  Laterality: Right;  . CYSTOSCOPY W/ URETERAL STENT PLACEMENT Right 03/03/2018   Procedure: CYSTOSCOPY WITH RIGHT RETROGRADE / RIGHT URETERAL STENT EXCHANGE;  Surgeon: Alexis Frock, MD;  Location: [facility];  Service: Urology;  Laterality: Right;  . CYSTOSCOPY W/ URETERAL STENT PLACEMENT Right 09/22/2018   Procedure: CYSTOSCOPY WITH RETROGRADE PYELOGRAM/URETERAL STENT PLACEMENT;  Surgeon: Alexis Frock, MD;  Location: [facility];  Service: Urology;  Laterality: Right;  45 MINS  . CYSTOSCOPY WITH LITHOLAPAXY N/A 11/21/2013   Procedure: CYSTOSCOPY WITH LITHOLAPAXY;  Surgeon: Ailene Rud, MD;  Location: [facility];  Service: Urology;  Laterality: N/A;  . CYSTOSCOPY WITH RETROGRADE PYELOGRAM, URETEROSCOPY AND STENT PLACEMENT Bilateral 03/15/2014   Procedure: CYSTOSCOPY WITH BILATERAL RETROGRADE PYELOGRAM, RIGHT DIAGNOSTIC URETEROSCOPY AND STENT EXCHANGE;  Surgeon: Alexis Frock, MD;  Location: [facility];  Service: Urology;  Laterality: Bilateral;  . CYSTOSCOPY WITH STENT PLACEMENT Right 05/07/2016    Procedure: CYSTOSCOPY WITH RETROGRADE PYELOGRAM, URETERAL STENT PLACEMENT;  Surgeon: Franchot Gallo, MD;  Location: [facility];  Service: Urology;  Laterality: Right;  . HEMIARTHROPLASTY HIP Right 11/2016   fractured  . JOINT REPLACEMENT Right 11/2016   Hip Replacement  . LUMBAR FUSION  JULY 2013  . NEPHROLITHOTOMY  X2 YRS AGO  . PERCUTANEOUS NEPHROSTOMY  2014  . TOTAL ABDOMINAL HYSTERECTOMY W/ BILATERAL SALPINGOOPHORECTOMY  1989  . TOTAL KNEE ARTHROPLASTY Bilateral 1992  &  1996  . TRANSTHORACIC ECHOCARDIOGRAM  07-24-2017   dr Agustin Cree   ef 60-65% (improved from last echo 2014, was 45-50%)/  trace TR/  mild AV sclerosis without stenosis  . URETEROSCOPY Right 11/21/2013   Procedure: URETEROSCOPY WITH STENT REMOVAL AND REPLACEMENT;  Surgeon: Ailene Rud, MD;  Location: [facility];  Service: Urology;  Laterality: Right;    Social History:  reports that she has never smoked. She has never used smokeless tobacco. She reports that she does not drink alcohol or use drugs.   Allergies  Allergen Reactions  . Codeine Nausea Only and Other (See Comments)    hallucinations  . Hydrocodone Nausea Only and Other (See Comments)  . Macrodantin [Nitrofurantoin] Nausea And Vomiting    Family History  Problem Relation  Age of Onset  . Heart disease Mother   . Heart failure Mother   . Cancer Mother   . Heart disease Father   . Heart failure Father      Prior to Admission medications   Medication Sig Start Date End Date Taking? Authorizing Provider  acetaminophen (TYLENOL) 500 MG tablet Take 1,000 mg by mouth 2 (two) times daily as needed for moderate pain or headache.   Yes [provider]  amitriptyline (ELAVIL) 25 MG tablet Take 25 mg by mouth at bedtime.   Yes [provider]  carvedilol (COREG) 6.25 MG tablet TAKE 1/2 TABLET BY MOUTH TWICE DAILY 02/22/18  Yes Park Liter, MD  diltiazem (CARDIZEM CD) 120 MG 24 hr capsule Take 1 capsule (120 mg  total) by mouth every morning. Patient taking differently: Take 120 mg by mouth every morning.  07/28/17  Yes Park Liter, MD  ELIQUIS 5 MG TABS tablet Take 1 tablet (5 mg total) by mouth 2 (two) times daily. Patient taking differently: Take 5 mg by mouth 2 (two) times daily.  12/29/18  Yes Park Liter, MD  ferrous sulfate 325 (65 FE) MG EC tablet Take 325 mg by mouth 2 (two) times daily.    Yes [provider]  flecainide (TAMBOCOR) 50 MG tablet Take 1.5 tablets (75 mg total) by mouth 2 (two) times daily. 11/08/18  Yes Park Liter, MD  ketoconazole (NIZORAL) 2 % shampoo Apply 1 application topically daily as needed for rash. 10/26/18  Yes [provider]  omeprazole (PRILOSEC) 40 MG capsule Take 40 mg by mouth every morning.    Yes [provider]  ondansetron (ZOFRAN-ODT) 4 MG disintegrating tablet Take 4 mg by mouth every 6 (six) hours as needed for nausea/vomiting. 11/16/18  Yes [provider]  traMADol (ULTRAM) 50 MG tablet Take 50 mg by mouth every 6 (six) hours as needed. 11/16/18  Yes [provider]  triamcinolone cream (KENALOG) 0.1 % Apply 1 application topically 2 (two) times daily as needed for rash. 11/13/18  Yes [provider]  clotrimazole-betamethasone (LOTRISONE) cream Apply 1 application topically 2 (two) times daily as needed for rash. 11/17/18   [provider]  furosemide (LASIX) 40 MG tablet Take 40 mg by mouth daily as needed for fluid.     [provider]  potassium chloride (K-DUR) 10 MEQ tablet Take 1 tablet (10 mEq total) by mouth as needed. When you take Lasix. 08/04/17 10/06/19  Park Liter, MD    Physical Exam: BP 120/68   Pulse 90   Temp 98.8 F (37.1 C) (Oral)   Resp 18   SpO2 (!) 85%   . General: 78 y.o. year-old female well developed well nourished in no acute distress.  Alert and oriented x3. . Cardiovascular: Regular rate and rhythm with no rubs or gallops.   No thyromegaly or JVD noted.  No lower extremity edema. 2/4 pulses in all 4 extremities. [name] Respiratory: Clear to auscultation with no wheezes or rales. Good inspiratory effort. . Abdomen: Soft nontender nondistended with normal bowel sounds x4 quadrants.  Right flank tenderness. . Muskuloskeletal: No cyanosis, clubbing or edema noted bilaterally . Neuro: CN II-XII intact, strength, sensation, reflexes . Skin: No ulcerative lesions noted or rashes . Psychiatry: Judgement and insight appear normal. Mood is appropriate for condition and setting          Labs on Admission:  Basic Metabolic Panel: Recent Labs  Lab 01/03/19 1050  NA  135  K 3.7  CL 107  CO2 16*  GLUCOSE 146*  BUN 22  CREATININE 1.20*  CALCIUM 8.9   Liver Function Tests: Recent Labs  Lab 01/03/19 1050  AST 23  ALT 13  ALKPHOS 79  BILITOT 1.2  PROT 7.5  ALBUMIN 3.7   Recent Labs  Lab 01/03/19 1048  LIPASE 29   No results for input(s): AMMONIA in the last 168 hours. CBC: Recent Labs  Lab 01/03/19 1050  WBC 17.1*  HGB 15.9*  HCT 52.0*  MCV 88.9  PLT 653*   Cardiac Enzymes: Recent Labs  Lab 01/03/19 1050  TROPONINI <0.03    BNP (last 3 results) No results for input(s): BNP in the last 8760 hours.  ProBNP (last 3 results) No results for input(s): PROBNP in the last 8760 hours.  CBG: No results for input(s): GLUCAP in the last 168 hours.  Radiological Exams on Admission: Dg Chest 2 View  Result Date: 01/03/2019 CLINICAL DATA:  Acute onset severe right side pain and shortness of breath at 4 a.m. this morning. EXAM: CHEST - 2 VIEW COMPARISON:  PA and lateral chest 07/28/2017. Single-view of the chest 05/08/2015. FINDINGS: The lungs are clear. Heart size is normal. There is no pneumothorax or pleural effusion. Aortic atherosclerosis is noted. No acute bony abnormality. Remote compression fracture in the upper lumbar spine is seen on the 2018 exam. IMPRESSION: No acute disease. Atherosclerosis.  Electronically Signed   By: Inge Rise M.D.   On: 01/03/2019 10:17   Ct Abdomen Pelvis W Contrast  Result Date: 01/03/2019 CLINICAL DATA:  Acute onset right side abdominal pain and shortness of breath at 4 a.m. today. EXAM: CT ABDOMEN AND PELVIS WITH CONTRAST TECHNIQUE: Multidetector CT imaging of the abdomen and pelvis was performed using the standard protocol following bolus administration of intravenous contrast. CONTRAST:  80 mL ISOVUE-300 IOPAMIDOL (ISOVUE-300) INJECTION 61% COMPARISON:  CT abdomen and pelvis 05/07/2016. FINDINGS: Lower chest: There is mild cardiomegaly. Dependent atelectasis is greater on the right. No pleural or pericardial effusion. Hepatobiliary: Status post cholecystectomy. No focal lesion. Minimal intrahepatic biliary ductal dilatation is likely related to cholecystectomy. Pancreas: Unremarkable. No pancreatic ductal dilatation or surrounding inflammatory changes. Spleen: Normal in size without focal abnormality. Adrenals/Urinary Tract: The patient has a right double-J ureteral stent in place which is new since the prior CT. The stent projects in good position but there is moderate to moderately severe right hydronephrosis which appears unchanged since the prior examination. Extensive stranding is present about the right kidney and proximal ureter and worse than on the prior study. Left renal cyst is unchanged. The kidneys and ureters are otherwise unremarkable. The urinary bladder is almost completely decompressed. The adrenal glands appear normal. Stomach/Bowel: Mild sigmoid diverticulosis without diverticulitis is noted. The colon is otherwise normal in appearance. The appendix is not seen but no evidence of appendicitis is identified. The stomach and small bowel appear normal. Vascular/Lymphatic: Aortic atherosclerosis. No enlarged abdominal or pelvic lymph nodes. Reproductive: Status post hysterectomy. No adnexal masses. Pessary device noted. Other: None. Musculoskeletal:  The patient is status post L3-5 fusion. Remote L2 compression fracture is unchanged. No acute or new bony abnormality is identified. IMPRESSION: Moderate to moderately severe right hydronephrosis does not appear markedly changed compared to the prior examination with a right double-J ureteral stent in good position. Extensive stranding about the right kidney and proximal ureter is worse than on the prior exam and worrisome for urinary tract infection/ureteritis. No CT signs of pyelonephritis. No  abscess. Atherosclerosis. Mild sigmoid diverticulosis without diverticulitis. Electronically Signed   By: Inge Rise M.D.   On: 01/03/2019 12:49    EKG: I independently viewed the EKG done and my findings are as followed: Sinus tachycardia with a rate 109.  No specific ST-T changes.  Assessment/Plan Present on Admission: . Hydronephrosis  Active Problems:   Hydronephrosis  Moderately severe right hydronephrosis with ureteral stent Findings on CT abdomen and pelvis with contrast Urology has been consulted and will see the patient Follows with Dr. Tresa Moore outpatient N.p.o. due to possible surgical procedure Obtain bedside bladder scan  Sepsis secondary to complicated UTI with suspected right pyelonephritis Presented with leukocytosis with WBC 17 K, tachycardia, positive urine analysis for pyuria, right flank pain Started on Levaquin in the ED Last urine culture from 05/07/2016 resistant to ciprofloxacin DC Levaquin Start Rocephin 1 g daily Obtain urine culture and blood cultures peripherally x2 Previous urine culture sensitive to ceftriaxone and cefazolin Monitor fever curve and WBC Repeat CBC in the morning Monitor urine output Start gentle IV fluid hydration LR at 75 cc/h Start pain management Start bowel regimen to avoid opiate-induced constipation  AKI, suspect post obstructive Baseline creatinine appears to be 0.90 with GFR greater than 60 Presented with creatinine of 1.20 with GFR  of 44 Avoid nephrotoxic agents/dehydration/hypotension Hold Lasix Start IV fluid hydration LR at 75 cc/h Monitor urine output Repeat BMP in the morning  History of recurrent ureteral obstruction Has ureteral stent in place Follows with urology outpatient Dr. Tresa Moore Self-reported ureteral stent are replaced every 5 months Urology consulted by ED physician Possible surgical procedure  Keep n.p.o. until evaluated by urology  Paroxysmal A. Fib Rate controlled with Coreg and diltiazem Continue Coreg 6.25 mg twice daily, diltiazem 120 mg daily On Eliquis for primary CVA prophylaxis Hold Eliquis due to possible procedure and start heparin drip Pharmacy will manage heparin drip  GERD Continue omeprazole  Chronic diastolic CHF Appears euvolemic Hold Lasix today  Continue cardiac medications Start strict I's and O's and daily weight   Risks: Patient is high risk for decompensation due to sepsis secondary to complicated UTI with suspected right pyelonephritis, right moderately severe hydronephrosis in the setting of ureteral stent, multiple comorbidities and advanced age.  Patient will require at least 2 midnights for further evaluation and treatment of present condition.   DVT prophylaxis: Hold Eliquis due to possible procedure and start heparin drip  Code Status: Full code as confirmed by patient herself.  Family Communication: None at bedside  Disposition Plan: Admit to telemetry unit  Consults called: Urology called by ED physician  Admission status: Inpatient status    Kayleen Memos MD [facility] Pager [phone]  If 7PM-7AM, please contact night-coverage [email or web address] Password [email or web address]  01/03/2019, 2:20 PM

## 2019-01-03 NOTE — Anesthesia Procedure Notes (Addendum)
Procedure Name: LMA Insertion Date/Time: 01/03/2019 5:29 PM Performed by: Anne Fu, CRNA Pre-anesthesia Checklist: Patient identified, Emergency Drugs available, Suction available, Patient being monitored and Timeout performed Patient Re-evaluated:Patient Re-evaluated prior to induction Oxygen Delivery Method: Circle system utilized Preoxygenation: Pre-oxygenation with 100% oxygen Induction Type: IV induction Ventilation: Mask ventilation without difficulty LMA: LMA inserted LMA Size: 4.0 Number of attempts: 1 Placement Confirmation: positive ETCO2 and breath sounds checked- equal and bilateral Tube secured with: Tape

## 2019-01-04 ENCOUNTER — Encounter (HOSPITAL_COMMUNITY): Payer: Self-pay | Admitting: Urology

## 2019-01-04 LAB — BLOOD CULTURE ID PANEL (REFLEXED)

## 2019-01-04 LAB — BASIC METABOLIC PANEL
Anion gap: 11 (ref 5–15)
BUN: 31 mg/dL — ABNORMAL HIGH (ref 8–23)
CO2: 21 mmol/L — ABNORMAL LOW (ref 22–32)
Calcium: 8.1 mg/dL — ABNORMAL LOW (ref 8.9–10.3)
Chloride: 105 mmol/L (ref 98–111)
Creatinine, Ser: 1.36 mg/dL — ABNORMAL HIGH (ref 0.44–1.00)
GFR calc Af Amer: 44 mL/min — ABNORMAL LOW (ref >60)
GFR calc non Af Amer: 38 mL/min — ABNORMAL LOW (ref >60)
Glucose, Bld: 104 mg/dL — ABNORMAL HIGH (ref 70–99)
Potassium: 4.3 mmol/L (ref 3.5–5.1)
Sodium: 137 mmol/L (ref 135–145)

## 2019-01-04 LAB — CBC
HCT: 47.7 % — ABNORMAL HIGH (ref 36.0–46.0)
Hemoglobin: 14.5 g/dL (ref 12.0–15.0)
MCH: 27.5 pg (ref 26.0–34.0)
MCHC: 30.4 g/dL (ref 30.0–36.0)
MCV: 90.5 fL (ref 80.0–100.0)
Platelets: 545 10*3/uL — ABNORMAL HIGH (ref 150–400)
RBC: 5.27 MIL/uL — ABNORMAL HIGH (ref 3.87–5.11)
RDW: 15.6 % — ABNORMAL HIGH (ref 11.5–15.5)
WBC: 18.3 10*3/uL — ABNORMAL HIGH (ref 4.0–10.5)
nRBC: 0 % (ref 0.0–0.2)

## 2019-01-04 MED ORDER — SODIUM CHLORIDE 0.9 % IV SOLN
1.0000 g | Freq: Two times a day (BID) | INTRAVENOUS | Status: DC
  Administered 2019-01-04 – 2019-01-06 (×4): 1 g via INTRAVENOUS
  Filled 2019-01-04 (×4): qty 1

## 2019-01-04 NOTE — Progress Notes (Signed)
1 Day Post-Op   Subjective/Chief Complaint:   1 - Rt Chronic Hydronephrosis / Partial Ureteral Stricture- Multiple prior stone procedures on right including SWL, ureteroscopy, and neph tube.    Recent Course:  02/2018 - Exchange Rt 8x24 polaris stent; 08/2018 Exchange Rt 8x24 polaris stent 01/03/2019 - Exchange Rt 8x24 polaris stent as some hydro / stranding concerning for possible stent occlusion.    2 -Recurrent Urinary Tract Infection / Urosepsis - Several episodes of simple cystitis, one febrile / pyelo episode 05/2013 as per above from e. coli sens to keflex, gent, but resistnat to others (cipro, bactrim). Similar febrile pyelo 04/2016 e. coli Res bactrim / cipro / amp but Sens keflex, gent, nitro, rocephin. Pelvic 02/2014 atrophic vaginitis, reduced prolapse with well-sized pessary. PVR 02/2014 "93mL" UCX 2019 e. coli sens keflex, gent, nitro.   [place] 01/03/19 - e.coli / pending, placed on empiric Merrem   PMH sig for AFib / Thrombocytosis / Eliquus (primary prevention only, cardioversion 2018) , lap chole, hyst, left open ureterolithotomy, ureteroscopy x several, shockwave lithotripsy.    Today [name] [name] is stable. Still some tachycardia and spiking fevers but trending down.      Objective: Vital signs in last 24 hours: Temp:  [97.5 F (36.4 C)-101.3 F (38.5 C)] 98.1 F (36.7 C) (03/10 0802) Pulse Rate:  [78-101] 101 (03/10 0802) Resp:  [16-29] 18 (03/10 0802) BP: (96-125)/(42-73) 111/61 (03/10 0802) SpO2:  [85 %-96 %] 93 % (03/10 0802) Weight:  [85.7 kg] 85.7 kg (03/09 1521) Last BM Date: 01/02/19  Intake/Output from previous day: 03/09 0701 - 03/10 0700 In: 2019.9 [P.O.:220; I.V.:1482.8; IV Piggyback:317.1] Out: 480 [Urine:475; Blood:5] Intake/Output this shift: Total I/O In: 304.3 [P.O.:240; I.V.:64.3] Out: 300 [Urine:300]  General appearance: alert, cooperative and at baseline, very pleasant Eyes: negative Nose: Nares normal. Septum midline. Mucosa normal. No  drainage or sinus tenderness. Throat: lips, mucosa, and tongue normal; teeth and gums normal Neck: supple, symmetrical, trachea midline Back: symmetric, no curvature. ROM normal. No CVA tenderness. Resp: non-labored on room air.  Cardio: mild tachycardia.  Extremities: extremities normal, atraumatic, no cyanosis or edema Lymph nodes: Cervical, supraclavicular, and axillary nodes normal.  Lab Results:  Recent Labs    01/03/19 1050 01/04/19 0739  WBC 17.1* 18.3*  HGB 15.9* 14.5  HCT 52.0* 47.7*  PLT 653* 545*   BMET Recent Labs    01/03/19 1050 01/04/19 0739  NA 135 137  K 3.7 4.3  CL 107 105  CO2 16* 21*  GLUCOSE 146* 104*  BUN 22 31*  CREATININE 1.20* 1.36*  CALCIUM 8.9 8.1*   PT/INR No results for input(s): LABPROT, INR in the last 72 hours. ABG No results for input(s): PHART, HCO3 in the last 72 hours.  Invalid input(s): PCO2, PO2  Studies/Results: Dg Chest 2 View  Result Date: 01/03/2019 CLINICAL DATA:  Acute onset severe right side pain and shortness of breath at 4 a.m. this morning. EXAM: CHEST - 2 VIEW COMPARISON:  PA and lateral chest 07/28/2017. Single-view of the chest 05/08/2015. FINDINGS: The lungs are clear. Heart size is normal. There is no pneumothorax or pleural effusion. Aortic atherosclerosis is noted. No acute bony abnormality. Remote compression fracture in the upper lumbar spine is seen on the 2018 exam. IMPRESSION: No acute disease. Atherosclerosis. Electronically Signed   By: Inge Rise M.D.   On: 01/03/2019 10:17   Ct Abdomen Pelvis W Contrast  Result Date: 01/03/2019 CLINICAL DATA:  Acute onset right side abdominal pain and shortness of  breath at 4 a.m. today. EXAM: CT ABDOMEN AND PELVIS WITH CONTRAST TECHNIQUE: Multidetector CT imaging of the abdomen and pelvis was performed using the standard protocol following bolus administration of intravenous contrast. CONTRAST:  80 mL ISOVUE-300 IOPAMIDOL (ISOVUE-300) INJECTION 61% COMPARISON:  CT  abdomen and pelvis 05/07/2016. FINDINGS: Lower chest: There is mild cardiomegaly. Dependent atelectasis is greater on the right. No pleural or pericardial effusion. Hepatobiliary: Status post cholecystectomy. No focal lesion. Minimal intrahepatic biliary ductal dilatation is likely related to cholecystectomy. Pancreas: Unremarkable. No pancreatic ductal dilatation or surrounding inflammatory changes. Spleen: Normal in size without focal abnormality. Adrenals/Urinary Tract: The patient has a right double-J ureteral stent in place which is new since the prior CT. The stent projects in good position but there is moderate to moderately severe right hydronephrosis which appears unchanged since the prior examination. Extensive stranding is present about the right kidney and proximal ureter and worse than on the prior study. Left renal cyst is unchanged. The kidneys and ureters are otherwise unremarkable. The urinary bladder is almost completely decompressed. The adrenal glands appear normal. Stomach/Bowel: Mild sigmoid diverticulosis without diverticulitis is noted. The colon is otherwise normal in appearance. The appendix is not seen but no evidence of appendicitis is identified. The stomach and small bowel appear normal. Vascular/Lymphatic: Aortic atherosclerosis. No enlarged abdominal or pelvic lymph nodes. Reproductive: Status post hysterectomy. No adnexal masses. Pessary device noted. Other: None. Musculoskeletal: The patient is status post L3-5 fusion. Remote L2 compression fracture is unchanged. No acute or new bony abnormality is identified. IMPRESSION: Moderate to moderately severe right hydronephrosis does not appear markedly changed compared to the prior examination with a right double-J ureteral stent in good position. Extensive stranding about the right kidney and proximal ureter is worse than on the prior exam and worrisome for urinary tract infection/ureteritis. No CT signs of pyelonephritis. No abscess.  Atherosclerosis. Mild sigmoid diverticulosis without diverticulitis. Electronically Signed   By: Inge Rise M.D.   On: 01/03/2019 12:49   Dg C-arm 1-60 Min-no Report  Result Date: 01/03/2019 Fluoroscopy was utilized by the requesting physician.  No radiographic interpretation.    Anti-infectives: Anti-infectives (From admission, onward)   Start     Dose/Rate Route Frequency Ordered Stop   01/04/19 1800  meropenem (MERREM) 1 g in sodium chloride 0.9 % 100 mL IVPB     1 g 200 mL/hr over 30 Minutes Intravenous Every 12 hours 01/04/19 1028     01/03/19 2200  meropenem (MERREM) 1 g in sodium chloride 0.9 % 100 mL IVPB  Status:  Discontinued     1 g 200 mL/hr over 30 Minutes Intravenous Every 8 hours 01/03/19 1844 01/04/19 1028   01/03/19 1900  cefTRIAXone (ROCEPHIN) 1 g in sodium chloride 0.9 % 100 mL IVPB     1 g 200 mL/hr over 30 Minutes Intravenous  Once 01/03/19 1846 01/03/19 1919   01/03/19 1823  sodium chloride 0.9 % with cefTRIAXone (ROCEPHIN) ADS Med    Note to Pharmacy:  Ward, Christa   : cabinet override      01/03/19 1823 01/04/19 0629   01/03/19 1652  cefTRIAXone (ROCEPHIN) 1 g in sodium chloride 0.9 % 100 mL IVPB  Status:  Discontinued     1 g 200 mL/hr over 30 Minutes Intravenous 30 min pre-op 01/03/19 1653 01/03/19 1844   01/03/19 1633  cefTRIAXone (ROCEPHIN) 2 g in sodium chloride 0.9 % 100 mL IVPB  Status:  Discontinued     2 g 200 mL/hr over  30 Minutes Intravenous 30 min pre-op 01/03/19 1633 01/03/19 1653   01/03/19 1500  cefTRIAXone (ROCEPHIN) 1 g in sodium chloride 0.9 % 100 mL IVPB  Status:  Discontinued     1 g 200 mL/hr over 30 Minutes Intravenous Every 24 hours 01/03/19 1444 01/03/19 1824   01/03/19 1345  levofloxacin (LEVAQUIN) IVPB 750 mg  Status:  Discontinued     750 mg 100 mL/hr over 90 Minutes Intravenous  Once 01/03/19 1332 01/03/19 1540      Assessment/Plan:  S/p stent exchange this admission. She has done well with Q77mo x years prior, consider  more frequent should early occlusion be recurrent. I suspect this occluded early due to bacteruria.   Agree with current ABX based on prior CX results from our office. Rec fever free x 24 hours before DC home with 10-14 days total ABX duration.   Greatly appreciate hospitalist team comanagement.   She has GU follow up 01/25/19.   Alexis Frock 01/04/2019

## 2019-01-04 NOTE — Progress Notes (Signed)
Pharmacy Antibiotic Note  [name] [name] is a 78 y.o. female admitted on 01/03/2019 with Sepsis secondary to complicated UTI with suspected right pyelonephritis.  Pharmacy has been consulted for meropenem dosing.  He has been given Levaquin 750 mg and Rocephin 1 gm today.  S/p cystoscopy w/ R ureteroscopy w/ findings of obstructed. Encrusted retained R ureteral stent w/ infection. - removed and fresh stent placed. T max 101.3 Scr 1.2, anticipate will improve now that obstruction removed.  01/04/2019  Scr up to 1.36, WBC 18.3, AF.   Plan: Decrease Meropenem to 1 gm IV q12h F/u de-escalation of abx   Height: 5\' 7"  (170.2 cm) Weight: 189 lb (85.7 kg) IBW/kg (Calculated) : 61.6  Temp (24hrs), Avg:98.5 F (36.9 C), Min:97.5 F (36.4 C), Max:101.3 F (38.5 C)  Recent Labs  Lab 01/03/19 1050 01/04/19 0739  WBC 17.1* 18.3*  CREATININE 1.20* 1.36*    Estimated Creatinine Clearance: 39.6 mL/min (A) (by C-G formula based on SCr of 1.36 mg/dL (H)).    Allergies  Allergen Reactions  . Codeine Nausea Only and Other (See Comments)    hallucinations  . Hydrocodone Nausea Only and Other (See Comments)  . Macrodantin [Nitrofurantoin] Nausea And Vomiting    Antimicrobials this admission: 3/9 LVQ 750 x 1 3/9 CTX 1 gm x 1 3/9 meropenem>> Dose adjustments this admission: 3/10 decr to 1 gm q12 Microbiology results: 3/9 Ucx 1753> 3/9 BCx2>>ngtd 3/9 Ucx 1050>>  Thank you for allowing pharmacy to be a part of this patient's care.  Eudelia Bunch, Pharm.D [phone] 01/04/2019 10:30 AM

## 2019-01-04 NOTE — Progress Notes (Signed)
PHARMACY - PHYSICIAN COMMUNICATION CRITICAL VALUE ALERT - BLOOD CULTURE IDENTIFICATION (BCID)  [name] [name] is an 78 y.o. female who presented to [facility] on 01/03/2019 with a chief complaint of sepsis 2/2 UTI  Assessment:  Blood culture with E coli  Name of physician (or Provider) Contacted: K Schorr  Current antibiotics: meropenem  Changes to prescribed antibiotics recommended:  none  Results for orders placed or performed during the hospital encounter of 01/03/19  Blood Culture ID Panel (Reflexed) (Collected: 01/03/2019  3:44 PM)  Result Value Ref Range   Enterococcus species NOT DETECTED NOT DETECTED   Listeria monocytogenes NOT DETECTED NOT DETECTED   Staphylococcus species NOT DETECTED NOT DETECTED   Staphylococcus aureus (BCID) NOT DETECTED NOT DETECTED   Streptococcus species NOT DETECTED NOT DETECTED   Streptococcus agalactiae NOT DETECTED NOT DETECTED   Streptococcus pneumoniae NOT DETECTED NOT DETECTED   Streptococcus pyogenes NOT DETECTED NOT DETECTED   Acinetobacter baumannii NOT DETECTED NOT DETECTED   Enterobacteriaceae species DETECTED (A) NOT DETECTED   Enterobacter cloacae complex NOT DETECTED NOT DETECTED   Escherichia coli DETECTED (A) NOT DETECTED   Klebsiella oxytoca NOT DETECTED NOT DETECTED   Klebsiella pneumoniae NOT DETECTED NOT DETECTED   Proteus species NOT DETECTED NOT DETECTED   Serratia marcescens NOT DETECTED NOT DETECTED   Carbapenem resistance NOT DETECTED NOT DETECTED   Haemophilus influenzae NOT DETECTED NOT DETECTED   Neisseria meningitidis NOT DETECTED NOT DETECTED   Pseudomonas aeruginosa NOT DETECTED NOT DETECTED   Candida albicans NOT DETECTED NOT DETECTED   Candida glabrata NOT DETECTED NOT DETECTED   Candida krusei NOT DETECTED NOT DETECTED   Candida parapsilosis NOT DETECTED NOT DETECTED   Candida tropicalis NOT DETECTED NOT DETECTED    Napoleon Form 01/04/2019  9:24 PM

## 2019-01-04 NOTE — Progress Notes (Signed)
PROGRESS NOTE  [name] [name] ERD:[id number] DOB: [birth year] DOA: 01/03/2019 PCP: [name], MD  HPI/Recap of past 24 hours: [name] [name] is a 78 y.o. female with medical history significant for paroxysmal A. fib on Eliquis, chronic diastolic CHF, recurrent ureteral obstruction post ureteral stent placement every 5 months, hyperlipidemia, GERD who presented to [facility] ED with complaints of sudden onset severe right flank pain that started earlier this morning around 4 AM.  Associated with chills.  Last visit with her urologist was 2-3 weeks ago.  She was then treated with Bactrim and completed her course of antibiotic for UTI.  No other changes to her medications.    In the ED, vital signs are remarkable for tachycardia with rate of 114, WBC 17 K and urine analysis positive for pyuria.  CT abdomen pelvis with contrast done on 01/03/2019 reveals moderate to moderately severe right hydronephrosis with a right double-J ureteral stent in good position and extensive stranding about the right kidney and proximal ureter worse than on the prior exam and worrisome for urinary tract infection/urethritis.  ED physician consulted urology.  [facility] asked to admit.   Taken to the OR by urology on 01/03/19 for replacement of her R ureter stent.  01/04/19: Patient seen and examined with her husband at bedside.  No acute events overnight.  No nausea at this point.  Her pain is well controlled on current pain management with IV Dilaudid.  She has no new complaints.  We will continue IV antibiotics meropenem as recommended by urology until we obtain results of urine culture and sensitivities to narrow down antibiotics.  Assessment/Plan: Active Problems:   Hydronephrosis  Moderately severe right hydronephrosis with obstructed right stent  Findings on CT abdomen and pelvis with contrast POD #1 post right stent replacement on 01/03/2019 by [name] Continue empiric IV antibiotics to meropenem as recommended by  urology Awaiting results of urine culture and sensitivities to narrow down antibiotics Blood cultures x2 peripherally no growth less than 12 hours Urine culture in process Follows with Dr. Tresa Moore outpatient Pain management in place with IV Dilaudid for severe pain Oxycodone for moderate pain Tylenol for mild pain Senokot 2 tablet twice daily for bowel regimen to avoid opiate-induced constipation  Sepsis secondary to complicated UTI with suspected right pyelonephritis Presented with leukocytosis with WBC 17 K, tachycardia, positive urine analysis for pyuria, right flank pain Continue meropenem Urine culture in process Blood cultures peripherally x2 no growth in less than 12 hours Previous urine culture resistant to fluoroquinolone and sensitive to ceftriaxone and cefazolin WBC 18.3K from 17.1K most likely reactive component is included  Continue gentle IV fluid hydration LR 75 cc/h Repeat CBC in the morning  AKI, suspect post obstructive Baseline creatinine appears to be 0.90 with GFR greater than 60 Presented with creatinine of 1.20 with GFR of 44 On 01/04/2019 creatinine 1.3 Continue to avoid nephrotoxic agents/dehydration/hypotension Continue to hold Lasix Continue gentle IV fluid hydration LR 75 cc/h Monitor urine output Repeat BMP in the morning  History of recurrent ureteral obstruction Has ureteral stent in place Follows with urology outpatient Dr. Tresa Moore Self-reported ureteral stent are replaced every 5 months Urology following  Paroxysmal A. Fib Rate controlled with Coreg and diltiazem Continue Coreg 6.25 mg twice daily, diltiazem 120 mg daily Continue Eliquis for primary CVA prevention  GERD Continue omeprazole  Chronic diastolic CHF Appears euvolemic Continue cardiac medications Continue strict I's and O's and daily weight   Risks: Patient is high risk for  decompensation due to ongoing sepsis secondary to complicated UTI with right ureter obstruction  POD #1 post replacement, multiple comorbidities and advanced age.  Patient will require at least 2 midnights for further evaluation and treatment of present condition.    DVT prophylaxis:  Eliquis  Code Status: Full code as confirmed by patient herself.  Family Communication:  Husband at bedside.  All questions answered to his satisfaction.  Disposition Plan:  Discharge to home when antibiotics are narrow down from sensitivities and urology signs off.  Consults called: Urology      Objective: Vitals:   01/04/19 0000 01/04/19 0439 01/04/19 0802 01/04/19 1229  BP: (!) 99/56 (!) 108/56 111/61 (!) 109/59  Pulse: 88 97 (!) 101 95  Resp: (!) 22 16 18    Temp: 97.9 F (36.6 C) 98.1 F (36.7 C) 98.1 F (36.7 C) 98.4 F (36.9 C)  TempSrc: Oral Oral Oral Oral  SpO2: 95% 94% 93% 90%  Weight:      Height:        Intake/Output Summary (Last 24 hours) at 01/04/2019 1341 Last data filed at 01/04/2019 1200 Gross per 24 hour  Intake 2324.25 ml  Output 780 ml  Net 1544.25 ml   Filed Weights   01/03/19 1521  Weight: 85.7 kg    Exam:  . General: 78 y.o. year-old female well-developed well-nourished no acute distress.  Alert and oriented x3. . Cardiovascular: Regular rate and rhythm with no rubs or gallops.  No JVD or thyromegaly noted.   [name] Respiratory: Clear to auscultation with no wheezes or rales.  Good inspiratory effort.   . Abdomen: Soft nontender nondistended with normal bowel sounds x4 quadrants. . Musculoskeletal: No lower extremity edema. 2/4 pulses in all 4 extremities. [name] Psychiatry: Mood is appropriate for condition and setting   Data Reviewed: CBC: Recent Labs  Lab 01/03/19 1050 01/04/19 0739  WBC 17.1* 18.3*  HGB 15.9* 14.5  HCT 52.0* 47.7*  MCV 88.9 90.5  PLT 653* 976*   Basic Metabolic Panel: Recent Labs  Lab 01/03/19 1050 01/04/19 0739  NA 135 137  K 3.7 4.3  CL 107 105  CO2 16* 21*  GLUCOSE 146* 104*  BUN 22 31*  CREATININE 1.20* 1.36*   CALCIUM 8.9 8.1*   GFR: Estimated Creatinine Clearance: 39.6 mL/min (A) (by C-G formula based on SCr of 1.36 mg/dL (H)). Liver Function Tests: Recent Labs  Lab 01/03/19 1050  AST 23  ALT 13  ALKPHOS 79  BILITOT 1.2  PROT 7.5  ALBUMIN 3.7   Recent Labs  Lab 01/03/19 1048  LIPASE 29   No results for input(s): AMMONIA in the last 168 hours. Coagulation Profile: No results for input(s): INR, PROTIME in the last 168 hours. Cardiac Enzymes: Recent Labs  Lab 01/03/19 1050  TROPONINI <0.03   BNP (last 3 results) No results for input(s): PROBNP in the last 8760 hours. HbA1C: No results for input(s): HGBA1C in the last 72 hours. CBG: No results for input(s): GLUCAP in the last 168 hours. Lipid Profile: No results for input(s): CHOL, HDL, LDLCALC, TRIG, CHOLHDL, LDLDIRECT in the last 72 hours. Thyroid Function Tests: No results for input(s): TSH, T4TOTAL, FREET4, T3FREE, THYROIDAB in the last 72 hours. Anemia Panel: No results for input(s): VITAMINB12, FOLATE, FERRITIN, TIBC, IRON, RETICCTPCT in the last 72 hours. Urine analysis:    Component Value Date/Time   COLORURINE AMBER (A) 01/03/2019 1050   APPEARANCEUR CLOUDY (A) 01/03/2019 1050   LABSPEC 1.030 01/03/2019 1050   PHURINE 5.0  01/03/2019 1050   GLUCOSEU NEGATIVE 01/03/2019 1050   HGBUR LARGE (A) 01/03/2019 1050   BILIRUBINUR NEGATIVE 01/03/2019 1050   KETONESUR NEGATIVE 01/03/2019 1050   PROTEINUR 100 (A) 01/03/2019 1050   UROBILINOGEN 1.0 06/04/2013 1250   NITRITE POSITIVE (A) 01/03/2019 1050   LEUKOCYTESUR MODERATE (A) 01/03/2019 1050   Sepsis Labs: @LABRCNTIP (procalcitonin:4,lacticidven:4)  ) Recent Results (from the past 240 hour(s))  Culture, blood (routine x 2)     Status: None (Preliminary result)   Collection Time: 01/03/19  3:44 PM  Result Value Ref Range Status   Specimen Description   Final    BLOOD LEFT ARM Performed at [facility] [address] [place], [place]  [address]    Special Requests   Final    BOTTLES DRAWN AEROBIC ONLY Blood Culture adequate volume Performed at [facility] [address], [place], [place] [address]    Culture   Final    NO GROWTH < 12 HOURS Performed at [facility] [address], [place], [place] [address]    Report Status PENDING  Incomplete  Culture, blood (routine x 2)     Status: None (Preliminary result)   Collection Time: 01/03/19  3:44 PM  Result Value Ref Range Status   Specimen Description   Final    BLOOD RIGHT HAND Performed at [facility] [address], [place], [place] [address]    Special Requests   Final    BOTTLES DRAWN AEROBIC ONLY Blood Culture adequate volume Performed at [facility] [address] [place], [place] [address]    Culture   Final    NO GROWTH < 12 HOURS Performed at [facility] [address], [place], [place] [address]    Report Status PENDING  Incomplete      Studies: Dg C-arm 1-60 Min-no Report  Result Date: 01/03/2019 Fluoroscopy was utilized by the requesting physician.  No radiographic interpretation.    Scheduled Meds: . amitriptyline  25 mg Oral QHS  . apixaban  5 mg Oral BID  . carvedilol  3.125 mg Oral BID WC  . diltiazem  120 mg Oral q morning - 10a  . ferrous sulfate  325 mg Oral BID  . flecainide  75 mg Oral BID  . pantoprazole  40 mg Oral Daily  . polyethylene glycol  17 g Oral Daily  . senna-docusate  2 tablet Oral BID    Continuous Infusions: . lactated ringers 75 mL/hr at 01/04/19 1105  . meropenem (MERREM) IV       LOS: 1 day     Kayleen Memos, MD [facility] Pager [phone]  If 7PM-7AM, please contact night-coverage [email or web address] Password [email or web address] 01/04/2019, 1:41 PM

## 2019-01-05 DIAGNOSIS — T83192A Other mechanical complication of urinary stent, initial encounter: Secondary | ICD-10-CM

## 2019-01-05 DIAGNOSIS — N390 Urinary tract infection, site not specified: Secondary | ICD-10-CM

## 2019-01-05 LAB — CBC
HCT: 44.3 % (ref 36.0–46.0)
Hemoglobin: 13.6 g/dL (ref 12.0–15.0)
MCH: 27.4 pg (ref 26.0–34.0)
MCHC: 30.7 g/dL (ref 30.0–36.0)
MCV: 89.3 fL (ref 80.0–100.0)
Platelets: 444 10*3/uL — ABNORMAL HIGH (ref 150–400)
RBC: 4.96 MIL/uL (ref 3.87–5.11)
RDW: 15.4 % (ref 11.5–15.5)
WBC: 14.7 10*3/uL — ABNORMAL HIGH (ref 4.0–10.5)
nRBC: 0 % (ref 0.0–0.2)

## 2019-01-05 LAB — BASIC METABOLIC PANEL
Anion gap: 11 (ref 5–15)
BUN: 33 mg/dL — ABNORMAL HIGH (ref 8–23)
CO2: 23 mmol/L (ref 22–32)
Calcium: 8.5 mg/dL — ABNORMAL LOW (ref 8.9–10.3)
Chloride: 102 mmol/L (ref 98–111)
Creatinine, Ser: 1.11 mg/dL — ABNORMAL HIGH (ref 0.44–1.00)
GFR calc Af Amer: 56 mL/min — ABNORMAL LOW (ref >60)
GFR calc non Af Amer: 48 mL/min — ABNORMAL LOW (ref >60)
Glucose, Bld: 122 mg/dL — ABNORMAL HIGH (ref 70–99)
Potassium: 3.9 mmol/L (ref 3.5–5.1)
Sodium: 136 mmol/L (ref 135–145)

## 2019-01-05 NOTE — Plan of Care (Signed)
  Problem: Education: Goal: Knowledge of General Education information will improve Description: Including pain rating scale, medication(s)/side effects and non-pharmacologic comfort measures Outcome: Progressing   Problem: Health Behavior/Discharge Planning: Goal: Ability to manage health-related needs will improve Outcome: Progressing   Problem: Clinical Measurements: Goal: Ability to maintain clinical measurements within normal limits will improve Outcome: Progressing Goal: Will remain free from infection Outcome: Progressing Goal: Diagnostic test results will improve Outcome: Progressing   Problem: Activity: Goal: Risk for activity intolerance will decrease Outcome: Progressing   Problem: Nutrition: Goal: Adequate nutrition will be maintained Outcome: Progressing   Problem: Pain Managment: Goal: General experience of comfort will improve Outcome: Progressing   

## 2019-01-05 NOTE — Progress Notes (Signed)
PROGRESS NOTE    [name] [name]  HEN:[id number] DOB: 03/14/1941 DOA: 01/03/2019 PCP: [name], MD   Brief Narrative:78 y.o.femalewith medical history significant forparoxysmal A. fib on Eliquis, chronic diastolic CHF, recurrent ureteral obstruction post ureteral stent placement every 5 months, hyperlipidemia, GERD who presented to [facility] ED with complaints of sudden onset severe right flank pain that started earlier this morning around 4 AM. Associated with chills. Last visit with her urologist was 2-3 weeks ago. She was thentreated with Bactrim and completed her course of antibiotic for UTI.No other changes to her medications.   In the ED, vital signsareremarkable for tachycardia with rate of 114, WBC 17 K and urine analysis positive for pyuria. CT abdomen pelvis with contrast done on 01/03/2019 reveals moderate to moderately severe right hydronephrosis with a right double-J ureteral stent in good position and extensive stranding about the right kidney and proximal ureter worse than on the prior exam and worrisome for urinary tract infection/urethritis. ED physician consulted urology. TRH asked to admit.   Taken to the OR by urology on 01/03/19 for replacement of her R ureter stent Assessment & Plan:   Active Problems:   Hydronephrosis   Moderately severe right hydronephrosis with obstructed right stent  Findings on CT abdomen and pelvis with contrast POD #2  post right stent replacement on 01/03/2019 by [name] Continue empiric IV antibiotics to meropenem as recommended by urology Urine culture pending, blood culture with E. coli and sensitivity pending. Follows with Dr. Donia Ast Pain management in place with IV Dilaudid for severe pain Oxycodone for moderate pain Tylenol for mild pain Senokot 2 tablet twice daily for bowel regimen to avoid opiate-induced constipation  Sepsis secondary to complicated UTI with suspected right pyelonephritis Presented with  leukocytosis with WBC 17 K, tachycardia, positive urine analysis for pyuria, right flank pain Continue meropenem Urine culture in process Previous urine culture resistant to fluoroquinolone and sensitive to ceftriaxone and cefazolin Still with leukocytosis, labs from this morning pending.  AKI, suspect post obstructive Baseline creatinine appears to be 0.90 with GFR greater than 60 Presented with creatinine of 1.20 with GFR of 44 On 01/04/2019 creatinine 1.3 labs from today pending Continue to avoid nephrotoxic agents/dehydration/hypotension Continue to hold Lasix Continue gentle IV fluid hydration LR 75 cc/h Monitor urine output  History of recurrent ureteral obstruction Has ureteral stent in place Follows with urology outpatient Dr. Tresa Moore Self-reported ureteral stentarereplaced every 5 months Urology following  Paroxysmal A. Fib Rate controlled with Coreg and diltiazem Continue Coreg 6.25 mg twice daily, diltiazem 120 mg daily Continue Eliquis for primary CVA prevention  GERD Continue omeprazole  Chronic diastolic CHF Appears euvolemic Continue cardiac medications Continue strict I's and O's and daily weight  Estimated body mass index is 30.77 kg/m as calculated from the following:   Height as of this encounter: 5\' 7"  (1.702 m).   Weight as of this encounter: 89.1 kg.  DVT prophylaxis: Eliquis  Code Status:Full code as confirmed by patient herself.  Family Communication: No family at bedside  Disposition Plan: Discharge to home when antibiotics are narrow down from sensitivities and urology signs off.  Consults called:Urology     Subjective: Patient sitting up complains of Foley catheter leaking but slept well  Objective: Vitals:   01/04/19 1611 01/04/19 1851 01/04/19 2137 01/05/19 0436  BP: (!) 106/55  (!) 107/56 109/90  Pulse: 86  92 84  Resp: 19  20 20   Temp: 99.1 F (37.3 C) 99.6 F (37.6 C) 98.9 F (  37.2 C) 97.7 F (36.5 C)  TempSrc:  Oral Oral Oral Oral  SpO2: 93%  96% 96%  Weight:    89.1 kg  Height:        Intake/Output Summary (Last 24 hours) at 01/05/2019 0817 Last data filed at 01/05/2019 0600 Gross per 24 hour  Intake 2081.37 ml  Output 1200 ml  Net 881.37 ml   Filed Weights   01/03/19 1521 01/05/19 0436  Weight: 85.7 kg 89.1 kg    Examination:  General exam: Appears calm and comfortable  Respiratory system: Clear to auscultation. Respiratory effort normal. Cardiovascular system: S1 & S2 heard, RRR. No JVD, murmurs, rubs, gallops or clicks. No pedal edema. Gastrointestinal system: Abdomen is nondistended, soft and nontender. No organomegaly or masses felt. Normal bowel sounds heard. Central nervous system: Alert and oriented. No focal neurological deficits. Extremities: Symmetric 5 x 5 power. Skin: No rashes, lesions or ulcers Psychiatry: Judgement and insight appear normal. Mood & affect appropriate.     Data Reviewed: I have personally reviewed following labs and imaging studies  CBC: Recent Labs  Lab 01/03/19 1050 01/04/19 0739  WBC 17.1* 18.3*  HGB 15.9* 14.5  HCT 52.0* 47.7*  MCV 88.9 90.5  PLT 653* 097*   Basic Metabolic Panel: Recent Labs  Lab 01/03/19 1050 01/04/19 0739  NA 135 137  K 3.7 4.3  CL 107 105  CO2 16* 21*  GLUCOSE 146* 104*  BUN 22 31*  CREATININE 1.20* 1.36*  CALCIUM 8.9 8.1*   GFR: Estimated Creatinine Clearance: 40.3 mL/min (A) (by C-G formula based on SCr of 1.36 mg/dL (H)). Liver Function Tests: Recent Labs  Lab 01/03/19 1050  AST 23  ALT 13  ALKPHOS 79  BILITOT 1.2  PROT 7.5  ALBUMIN 3.7   Recent Labs  Lab 01/03/19 1048  LIPASE 29   No results for input(s): AMMONIA in the last 168 hours. Coagulation Profile: No results for input(s): INR, PROTIME in the last 168 hours. Cardiac Enzymes: Recent Labs  Lab 01/03/19 1050  TROPONINI <0.03   BNP (last 3 results) No results for input(s): PROBNP in the last 8760 hours. HbA1C: No results  for input(s): HGBA1C in the last 72 hours. CBG: No results for input(s): GLUCAP in the last 168 hours. Lipid Profile: No results for input(s): CHOL, HDL, LDLCALC, TRIG, CHOLHDL, LDLDIRECT in the last 72 hours. Thyroid Function Tests: No results for input(s): TSH, T4TOTAL, FREET4, T3FREE, THYROIDAB in the last 72 hours. Anemia Panel: No results for input(s): VITAMINB12, FOLATE, FERRITIN, TIBC, IRON, RETICCTPCT in the last 72 hours. Sepsis Labs: No results for input(s): PROCALCITON, LATICACIDVEN in the last 168 hours.  Recent Results (from the past 240 hour(s))  Urine Culture     Status: Abnormal (Preliminary result)   Collection Time: 01/03/19 10:50 AM  Result Value Ref Range Status   Specimen Description   Final    URINE, CLEAN CATCH Performed at [facility], [place] [address], [place], [place] [address]    Special Requests   Final    NONE Performed at [facility], [place] [address] [place], [place] [address]    Culture >=100,000 COLONIES/mL GRAM NEGATIVE RODS (A)  Final   Report Status PENDING  Incomplete  Culture, blood (routine x 2)     Status: Abnormal (Preliminary result)   Collection Time: 01/03/19  3:44 PM  Result Value Ref Range Status   Specimen Description   Final    BLOOD LEFT ARM Performed at [facility]  [facility], [place] [address], [place], [place] [address]    Special Requests   Final    BOTTLES DRAWN AEROBIC ONLY Blood Culture adequate volume Performed at [place] [address], [place], [place] [address]    Culture  Setup Time   Final    GRAM NEGATIVE RODS AEROBIC BOTTLE ONLY CRITICAL RESULT CALLED TO, READ BACK BY AND VERIFIED WITH: S CHRISTY PHARMD 2025 01/04/19 A BROWNING    Culture (A)  Final    ESCHERICHIA COLI SUSCEPTIBILITIES TO FOLLOW Performed at [facility], [place] [address] [place], [place] [address]    Report Status PENDING  Incomplete  Culture, blood (routine x 2)      Status: None (Preliminary result)   Collection Time: 01/03/19  3:44 PM  Result Value Ref Range Status   Specimen Description   Final    BLOOD RIGHT HAND Performed at [place] [address] [place], [place] [address]    Special Requests   Final    BOTTLES DRAWN AEROBIC ONLY Blood Culture adequate volume Performed at [place] [address], [place], [place] [address]    Culture   Final    NO GROWTH 2 DAYS Performed at [place] [address] [place], [place] [address]    Report Status PENDING  Incomplete  Blood Culture ID Panel (Reflexed)     Status: Abnormal   Collection Time: 01/03/19  3:44 PM  Result Value Ref Range Status   Enterococcus species NOT DETECTED NOT DETECTED Final   Listeria monocytogenes NOT DETECTED NOT DETECTED Final   Staphylococcus species NOT DETECTED NOT DETECTED Final   Staphylococcus aureus (BCID) NOT DETECTED NOT DETECTED Final   Streptococcus species NOT DETECTED NOT DETECTED Final   Streptococcus agalactiae NOT DETECTED NOT DETECTED Final   Streptococcus pneumoniae NOT DETECTED NOT DETECTED Final   Streptococcus pyogenes NOT DETECTED NOT DETECTED Final   Acinetobacter baumannii NOT DETECTED NOT DETECTED Final   Enterobacteriaceae species DETECTED (A) NOT DETECTED Final    Comment: Enterobacteriaceae represent a large family of gram-negative bacteria, not a single organism. CRITICAL RESULT CALLED TO, READ BACK BY AND VERIFIED WITH: S CHRISTY PHARMD 2025 01/04/19 A BROWNING    Enterobacter cloacae complex NOT DETECTED NOT DETECTED Final   Escherichia coli DETECTED (A) NOT DETECTED Final    Comment: CRITICAL RESULT CALLED TO, READ BACK BY AND VERIFIED WITH: S CHRISTY PHARMD 2025 01/04/19 A BROWNING    Klebsiella oxytoca NOT DETECTED NOT DETECTED Final   Klebsiella pneumoniae NOT DETECTED NOT DETECTED Final   Proteus species NOT DETECTED NOT DETECTED Final   Serratia marcescens NOT DETECTED NOT  DETECTED Final   Carbapenem resistance NOT DETECTED NOT DETECTED Final   Haemophilus influenzae NOT DETECTED NOT DETECTED Final   Neisseria meningitidis NOT DETECTED NOT DETECTED Final   Pseudomonas aeruginosa NOT DETECTED NOT DETECTED Final   Candida albicans NOT DETECTED NOT DETECTED Final   Candida glabrata NOT DETECTED NOT DETECTED Final   Candida krusei NOT DETECTED NOT DETECTED Final   Candida parapsilosis NOT DETECTED NOT DETECTED Final   Candida tropicalis NOT DETECTED NOT DETECTED Final    Comment: Performed at [facility], [place] [address] [place], [place] [address]  Culture, Urine     Status: Abnormal (Preliminary result)   Collection Time: 01/03/19  5:53 PM  Result Value Ref Range Status   Specimen Description   Final    CYSTOSCOPY RIGHT KIDNEY  Performed at [facility], [place] [address] [place], [place] [address]    Special Requests   Final    NONE Performed at [facility], [place] [address] [place], [place] [address]    Culture >=100,000 COLONIES/mL GRAM NEGATIVE RODS (A)  Final   Report Status PENDING  Incomplete         Radiology Studies: Dg Chest 2 View  Result Date: 01/03/2019 CLINICAL DATA:  Acute onset severe right side pain and shortness of breath at 4 a.m. this morning. EXAM: CHEST - 2 VIEW COMPARISON:  PA and lateral chest 07/28/2017. Single-view of the chest 05/08/2015. FINDINGS: The lungs are clear. Heart size is normal. There is no pneumothorax or pleural effusion. Aortic atherosclerosis is noted. No acute bony abnormality. Remote compression fracture in the upper lumbar spine is seen on the 2018 exam. IMPRESSION: No acute disease. Atherosclerosis. Electronically Signed   By: Inge Rise M.D.   On: 01/03/2019 10:17   Ct Abdomen Pelvis W Contrast  Result Date: 01/03/2019 CLINICAL DATA:  Acute onset right side abdominal pain and shortness of breath at 4 a.m. today. EXAM: CT ABDOMEN AND PELVIS WITH CONTRAST  TECHNIQUE: Multidetector CT imaging of the abdomen and pelvis was performed using the standard protocol following bolus administration of intravenous contrast. CONTRAST:  80 mL ISOVUE-300 IOPAMIDOL (ISOVUE-300) INJECTION 61% COMPARISON:  CT abdomen and pelvis 05/07/2016. FINDINGS: Lower chest: There is mild cardiomegaly. Dependent atelectasis is greater on the right. No pleural or pericardial effusion. Hepatobiliary: Status post cholecystectomy. No focal lesion. Minimal intrahepatic biliary ductal dilatation is likely related to cholecystectomy. Pancreas: Unremarkable. No pancreatic ductal dilatation or surrounding inflammatory changes. Spleen: Normal in size without focal abnormality. Adrenals/Urinary Tract: The patient has a right double-J ureteral stent in place which is new since the prior CT. The stent projects in good position but there is moderate to moderately severe right hydronephrosis which appears unchanged since the prior examination. Extensive stranding is present about the right kidney and proximal ureter and worse than on the prior study. Left renal cyst is unchanged. The kidneys and ureters are otherwise unremarkable. The urinary bladder is almost completely decompressed. The adrenal glands appear normal. Stomach/Bowel: Mild sigmoid diverticulosis without diverticulitis is noted. The colon is otherwise normal in appearance. The appendix is not seen but no evidence of appendicitis is identified. The stomach and small bowel appear normal. Vascular/Lymphatic: Aortic atherosclerosis. No enlarged abdominal or pelvic lymph nodes. Reproductive: Status post hysterectomy. No adnexal masses. Pessary device noted. Other: None. Musculoskeletal: The patient is status post L3-5 fusion. Remote L2 compression fracture is unchanged. No acute or new bony abnormality is identified. IMPRESSION: Moderate to moderately severe right hydronephrosis does not appear markedly changed compared to the prior examination with a  right double-J ureteral stent in good position. Extensive stranding about the right kidney and proximal ureter is worse than on the prior exam and worrisome for urinary tract infection/ureteritis. No CT signs of pyelonephritis. No abscess. Atherosclerosis. Mild sigmoid diverticulosis without diverticulitis. Electronically Signed   By: Inge Rise M.D.   On: 01/03/2019 12:49   Dg C-arm 1-60 Min-no Report  Result Date: 01/03/2019 Fluoroscopy was utilized by the requesting physician.  No radiographic interpretation.        Scheduled Meds: . amitriptyline  25 mg Oral QHS  . apixaban  5 mg Oral BID  . carvedilol  3.125 mg Oral BID WC  . diltiazem  120 mg Oral q morning - 10a  . ferrous sulfate  325 mg Oral BID  . flecainide  75 mg Oral BID  . pantoprazole  40 mg Oral Daily  . polyethylene glycol  17 g Oral Daily  . senna-docusate  2 tablet Oral BID   Continuous Infusions: . lactated ringers 75 mL/hr at 01/05/19 0600  . meropenem (MERREM) IV 1 g (01/05/19 9038)     LOS: 2 days      Georgette Shell, MD [facility]   If 7PM-7AM, please contact night-coverage [email or web address] Password [facility] 01/05/2019, 8:17 AM

## 2019-01-06 ENCOUNTER — Inpatient Hospital Stay (HOSPITAL_COMMUNITY): Payer: Medicare Other

## 2019-01-06 LAB — CBC
HCT: 43.3 % (ref 36.0–46.0)
Hemoglobin: 13.1 g/dL (ref 12.0–15.0)
MCH: 27 pg (ref 26.0–34.0)
MCHC: 30.3 g/dL (ref 30.0–36.0)
MCV: 89.1 fL (ref 80.0–100.0)
Platelets: 431 10*3/uL — ABNORMAL HIGH (ref 150–400)
RBC: 4.86 MIL/uL (ref 3.87–5.11)
RDW: 15.5 % (ref 11.5–15.5)
WBC: 16.1 10*3/uL — ABNORMAL HIGH (ref 4.0–10.5)
nRBC: 0 % (ref 0.0–0.2)

## 2019-01-06 LAB — BASIC METABOLIC PANEL
Anion gap: 10 (ref 5–15)
BUN: 26 mg/dL — ABNORMAL HIGH (ref 8–23)
CO2: 24 mmol/L (ref 22–32)
Calcium: 8.4 mg/dL — ABNORMAL LOW (ref 8.9–10.3)
Chloride: 103 mmol/L (ref 98–111)
Creatinine, Ser: 0.79 mg/dL (ref 0.44–1.00)
GFR calc Af Amer: >60 mL/min (ref >60)
GFR calc non Af Amer: >60 mL/min (ref >60)
Glucose, Bld: 99 mg/dL (ref 70–99)
Potassium: 4 mmol/L (ref 3.5–5.1)
Sodium: 137 mmol/L (ref 135–145)

## 2019-01-06 LAB — URINE CULTURE
Culture: >=100000 — AB
Culture: >=100000 — AB

## 2019-01-06 LAB — CULTURE, BLOOD (ROUTINE X 2): Special Requests: ADEQUATE

## 2019-01-06 MED ORDER — CEFAZOLIN SODIUM-DEXTROSE 2-4 GM/100ML-% IV SOLN
2.0000 g | Freq: Three times a day (TID) | INTRAVENOUS | Status: DC
  Filled 2019-01-06: qty 100

## 2019-01-06 MED ORDER — SODIUM CHLORIDE 0.9 % IV SOLN
2.0000 g | INTRAVENOUS | Status: DC
  Administered 2019-01-06 – 2019-01-12 (×7): 2 g via INTRAVENOUS
  Filled 2019-01-06 (×7): qty 2
  Filled 2019-01-06: qty 20

## 2019-01-06 NOTE — Care Management Important Message (Signed)
Important Message  Patient Details  Name: [name] [name] MRN: [id number] Date of Birth: 1941/06/21   Medicare Important Message Given:  Yes    Kerin Salen 01/06/2019, 10:36 AMImportant Message  Patient Details  Name: [name] [name] MRN: [id number] Date of Birth: Mar 28, 1941   Medicare Important Message Given:  Yes    Kerin Salen 01/06/2019, 10:36 AM

## 2019-01-06 NOTE — Evaluation (Signed)
Physical Therapy Evaluation Patient Details Name: [name] [name] MRN: [id number] DOB: 1941-08-28 Today's Date: 01/06/2019   History of Present Illness  78 y.o. female with medical history significant for paroxysmal A. fib on Eliquis, chronic diastolic CHF, recurrent ureteral obstruction post ureteral stent placement every 5 months, hyperlipidemia, GERD and admitted for moderately severe right hydronephrosis with obstructed right stent and s/p right stent replacement on 01/03/2019  Clinical Impression  Pt admitted with above diagnosis. Pt currently with functional limitations due to the deficits listed below (see PT Problem List). Pt will benefit from skilled PT to increase their independence and safety with mobility to allow discharge to the venue listed below.  Pt only able to tolerate short distance ambulation due to dizziness and SOB.  SpO2 89% on room air upon return to bed so 2L O2 [place] reapplied (RN notified).  Pt typically uses SPC for mobility however requiring RW today.  Pt reports her spouse has assisted her at home after multiple surgeries, and she feels comfortable returning home upon d/c.  Recommend HHPT.     Follow Up Recommendations Home health PT;Supervision/Assistance - 24 hour    Equipment Recommendations  None recommended by PT    Recommendations for Other Services       Precautions / Restrictions Precautions Precautions: Fall Precaution Comments: monitor sats      Mobility  Bed Mobility Overal bed mobility: Needs Assistance Bed Mobility: Supine to Sit;Sit to Supine     Supine to sit: Min guard;HOB elevated Sit to supine: Min guard;HOB elevated   General bed mobility comments: increased time and effort  Transfers Overall transfer level: Needs assistance Equipment used: Rolling walker (2 wheeled) Transfers: Sit to/from Stand Sit to Stand: Min assist         General transfer comment: verbal cues for hand placement, assist to rise and  steady  Ambulation/Gait Ambulation/Gait assistance: Min assist Gait Distance (Feet): 30 Feet Assistive device: Rolling walker (2 wheeled) Gait Pattern/deviations: Step-through pattern;Decreased stride length;Trunk flexed     General Gait Details: verbal cues for safe use of RW, posture, pt reports mild dizziness and moderate SOB so returned to room, SpO2 89% on room air upon return to bed so 2L O2 [place] reapplied Investment banker, corporate notified)  Stairs            Wheelchair Mobility    Modified Rankin (Stroke Patients Only)       Balance Overall balance assessment: Needs assistance         Standing balance support: Bilateral upper extremity supported Standing balance-Leahy Scale: Poor Standing balance comment: requiring UE support today                             Pertinent Vitals/Pain Pain Assessment: No/denies pain(pt reports she had pain meds earlier and they are helping)    Home Living Family/patient expects to be discharged to:: Private residence Living Arrangements: Spouse/significant other Available Help at Discharge: Family Type of Home: House Home Access: Stairs to enter   Technical brewer of Steps: 2 Home Layout: One level Home Equipment: Environmental consultant - 2 wheels;Cane - single point      Prior Function Level of Independence: Independent               Hand Dominance        Extremity/Trunk Assessment        Lower Extremity Assessment Lower Extremity Assessment: Generalized weakness       Communication  Communication: No difficulties  Cognition Arousal/Alertness: Awake/alert Behavior During Therapy: WFL for tasks assessed/performed Overall Cognitive Status: Within Functional Limits for tasks assessed                                        General Comments      Exercises     Assessment/Plan    PT Assessment Patient needs continued PT services  PT Problem List Decreased strength;Decreased knowledge of use of  DME;Decreased activity tolerance;Decreased mobility;Decreased balance;Cardiopulmonary status limiting activity       PT Treatment Interventions DME instruction;Functional mobility training;Gait training;Therapeutic activities;Stair training;Therapeutic exercise;Balance training;Patient/family education    PT Goals (Current goals can be found in the Care Plan section)  Acute Rehab PT Goals PT Goal Formulation: With patient Time For Goal Achievement: 01/20/19 Potential to Achieve Goals: Good    Frequency Min 3X/week   Barriers to discharge        Co-evaluation               AM-PAC PT "6 Clicks" Mobility  Outcome Measure Help needed turning from your back to your side while in a flat bed without using bedrails?: A Little Help needed moving from lying on your back to sitting on the side of a flat bed without using bedrails?: A Little Help needed moving to and from a bed to a chair (including a wheelchair)?: A Little Help needed standing up from a chair using your arms (e.g., wheelchair or bedside chair)?: A Little Help needed to walk in hospital room?: A Little Help needed climbing 3-5 steps with a railing? : A Lot 6 Click Score: 17    End of Session Equipment Utilized During Treatment: Gait belt Activity Tolerance: Patient limited by fatigue Patient left: in bed;with call bell/phone within reach;with bed alarm set Nurse Communication: Mobility status PT Visit Diagnosis: Other abnormalities of gait and mobility (R26.89)    Time: 5830-9407 PT Time Calculation (min) (ACUTE ONLY): 14 min   Charges:   PT Evaluation $PT Eval Low Complexity: [place], PT, DPT Acute Rehabilitation Services Office: [phone] Pager: [phone]  Trena Platt 01/06/2019, 3:20 PM

## 2019-01-06 NOTE — Progress Notes (Signed)
PROGRESS NOTE    [name] [name]  JJO:[id number] DOB: February 21, 1941 DOA: 01/03/2019 PCP: [name], MD  Brief Narrative:78 y.o.femalewith medical history significant forparoxysmal A. fib on Eliquis, chronic diastolic CHF, recurrent ureteral obstruction post ureteral stent placement every 5 months, hyperlipidemia, GERD who presented to [facility] ED with complaints of sudden onset severe right flank pain that started earlier this morning around 4 AM. Associated with chills. Last visit with her urologist was 2-3 weeks ago. She was thentreated with Bactrim and completed her course of antibiotic for UTI.No other changes to her medications.  Inthe ED, vital signsareremarkable for tachycardia with rate of 114,WBC 17 K and urine analysis positive for pyuria. CT abdomen pelvis with contrast done on 01/03/2019 reveals moderate to moderately severe right hydronephrosis with a right double-J ureteral stent in good position and extensive stranding about the right kidney and proximal ureter worse than on the prior exam and worrisome for urinary tract infection/urethritis. ED physician consulted urology. TRH asked to admit.  Taken to the OR by urology on 01/03/19 for replacement of her R ureter stent Assessment & Plan:   Active Problems:   Hydronephrosis   Urinary tract infection without hematuria   Occlusion of ureteral stent (HCC)   Moderately severe right hydronephrosis withobstructed right stent Findings on CT abdomen and pelvis with contrast POD #2  post right stent replacement on 01/03/2019 by Dr.Wrenn.  Urine culture and blood culture with E. coli sensitive to Rocephin.  Meropenem DC'd Rocephin started.  She is still have leukocytosis with low-grade temp.  Continue Rocephin for today.  If she remains stable afebrile overnight plan discharge tomorrow if okay with urology.  Sepsis secondary to complicated UTI with suspected right pyelonephritis  AKI, suspect post obstructive Baseline  creatinine appears to be 0.90 with GFR greater than 60 Presented with creatinine of 1.20 with GFR of 44 On 01/04/2019 creatinine 1.3 labs from today pending Continue to avoid nephrotoxic agents/dehydration/hypotension Continue to hold Lasix Continue gentle IV fluid hydration LR 75 cc/h Monitor urine output  History of recurrent ureteral obstruction Has ureteral stent in place Follows with urology outpatient Dr. Tresa Moore Self-reported ureteral stentarereplaced every 5 months Urology following  Paroxysmal A. Fib Rate controlled with Coreg and diltiazem Continue Coreg 6.25 mg twice daily, diltiazem 120 mg daily Continue Eliquis for primary CVA prevention  GERD Continue omeprazole  Chronic diastolic CHF Appears euvolemic Continue cardiac medications Continuestrict I's and O's and daily weight Encouraged to use incentive spirometry Obtain chest x-ray  DVT prophylaxis:Eliquis  Code Status:Full code as confirmed by patient herself.  Family Communication: No family at bedside  Disposition Plan:Discharge to home when antibiotics arenarrow down from sensitivities and urology signs off.  Consults called:Urology    Estimated body mass index is 30.83 kg/m as calculated from the following:   Height as of this encounter: 5\' 7"  (1.702 m).   Weight as of this encounter: 89.3 kg.   Subjective: Patient feeling better earlier when I saw her related to her today she started complaining of shortness of breath patient complains of chronic shortness of breath  Objective: Vitals:   01/06/19 0500 01/06/19 0657 01/06/19 1148 01/06/19 1348  BP:  131/65 (!) 141/68 125/62  Pulse:  86 91 82  Resp:  18 18 18   Temp:  99.2 F (37.3 C) 99.1 F (37.3 C) 98.2 F (36.8 C)  TempSrc:  Oral Axillary Oral  SpO2:  95% 92% 92%  Weight: 89.3 kg     Height:  Intake/Output Summary (Last 24 hours) at 01/06/2019 1358 Last data filed at 01/06/2019 0940 Gross per 24 hour  Intake  1885.32 ml  Output 1275 ml  Net 610.32 ml   Filed Weights   01/03/19 1521 01/05/19 0436 01/06/19 0500  Weight: 85.7 kg 89.1 kg 89.3 kg    Examination:  General exam: Appears calm and comfortable  Respiratory system: Clear to auscultation. Respiratory effort normal. Cardiovascular system: S1 & S2 heard, RRR. No JVD, murmurs, rubs, gallops or clicks. No pedal edema. Gastrointestinal system: Abdomen is nondistended, soft and nontender. No organomegaly or masses felt. Normal bowel sounds heard. Central nervous system: Alert and oriented. No focal neurological deficits. Extremities: Symmetric 5 x 5 power. Skin: No rashes, lesions or ulcers Psychiatry: Judgement and insight appear normal. Mood & affect appropriate.     Data Reviewed: I have personally reviewed following labs and imaging studies  CBC: Recent Labs  Lab 01/03/19 1050 01/04/19 0739 01/05/19 0848 01/06/19 0426  WBC 17.1* 18.3* 14.7* 16.1*  HGB 15.9* 14.5 13.6 13.1  HCT 52.0* 47.7* 44.3 43.3  MCV 88.9 90.5 89.3 89.1  PLT 653* 545* 444* 768*   Basic Metabolic Panel: Recent Labs  Lab 01/03/19 1050 01/04/19 0739 01/05/19 0848 01/06/19 0426  NA 135 137 136 137  K 3.7 4.3 3.9 4.0  CL 107 105 102 103  CO2 16* 21* 23 24  GLUCOSE 146* 104* 122* 99  BUN 22 31* 33* 26*  CREATININE 1.20* 1.36* 1.11* 0.79  CALCIUM 8.9 8.1* 8.5* 8.4*   GFR: Estimated Creatinine Clearance: 68.7 mL/min (by C-G formula based on SCr of 0.79 mg/dL). Liver Function Tests: Recent Labs  Lab 01/03/19 1050  AST 23  ALT 13  ALKPHOS 79  BILITOT 1.2  PROT 7.5  ALBUMIN 3.7   Recent Labs  Lab 01/03/19 1048  LIPASE 29   No results for input(s): AMMONIA in the last 168 hours. Coagulation Profile: No results for input(s): INR, PROTIME in the last 168 hours. Cardiac Enzymes: Recent Labs  Lab 01/03/19 1050  TROPONINI <0.03   BNP (last 3 results) No results for input(s): PROBNP in the last 8760 hours. HbA1C: No results for  input(s): HGBA1C in the last 72 hours. CBG: No results for input(s): GLUCAP in the last 168 hours. Lipid Profile: No results for input(s): CHOL, HDL, LDLCALC, TRIG, CHOLHDL, LDLDIRECT in the last 72 hours. Thyroid Function Tests: No results for input(s): TSH, T4TOTAL, FREET4, T3FREE, THYROIDAB in the last 72 hours. Anemia Panel: No results for input(s): VITAMINB12, FOLATE, FERRITIN, TIBC, IRON, RETICCTPCT in the last 72 hours. Sepsis Labs: No results for input(s): PROCALCITON, LATICACIDVEN in the last 168 hours.  Recent Results (from the past 240 hour(s))  Urine Culture     Status: Abnormal   Collection Time: 01/03/19 10:50 AM  Result Value Ref Range Status   Specimen Description   Final    URINE, CLEAN CATCH Performed at [facility], [place] [address], [place], [place] [address]    Special Requests   Final    NONE Performed at [facility], [place] [address] [place], [place] [address]    Culture >=100,000 COLONIES/mL ESCHERICHIA COLI (A)  Final   Report Status 01/06/2019 FINAL  Final   Organism ID, Bacteria ESCHERICHIA COLI (A)  Final      Susceptibility   Escherichia coli - MIC*    AMPICILLIN 16 INTERMEDIATE Intermediate     CEFAZOLIN <=4 SENSITIVE Sensitive     CEFTRIAXONE <=1 SENSITIVE Sensitive  CIPROFLOXACIN >=4 RESISTANT Resistant     GENTAMICIN 4 SENSITIVE Sensitive     IMIPENEM 0.5 SENSITIVE Sensitive     NITROFURANTOIN <=16 SENSITIVE Sensitive     TRIMETH/SULFA <=20 SENSITIVE Sensitive     AMPICILLIN/SULBACTAM 4 SENSITIVE Sensitive     PIP/TAZO 8 SENSITIVE Sensitive     Extended ESBL NEGATIVE Sensitive     * >=100,000 COLONIES/mL ESCHERICHIA COLI  Culture, blood (routine x 2)     Status: Abnormal   Collection Time: 01/03/19  3:44 PM  Result Value Ref Range Status   Specimen Description   Final    BLOOD LEFT ARM Performed at [place] [address] [place], [place] [address]    Special Requests    Final    BOTTLES DRAWN AEROBIC ONLY Blood Culture adequate volume Performed at [place] [address] [place], [place] [address]    Culture  Setup Time   Final    GRAM NEGATIVE RODS AEROBIC BOTTLE ONLY CRITICAL RESULT CALLED TO, READ BACK BY AND VERIFIED WITH: S CHRISTY PHARMD 2025 01/04/19 A BROWNING Performed at [facility], [place] [address] [place], [place] [address]    Culture ESCHERICHIA COLI (A)  Final   Report Status 01/06/2019 FINAL  Final   Organism ID, Bacteria ESCHERICHIA COLI  Final      Susceptibility   Escherichia coli - MIC*    AMPICILLIN <=2 SENSITIVE Sensitive     CEFAZOLIN <=4 SENSITIVE Sensitive     CEFEPIME <=1 SENSITIVE Sensitive     CEFTAZIDIME <=1 SENSITIVE Sensitive     CEFTRIAXONE <=1 SENSITIVE Sensitive     CIPROFLOXACIN >=4 RESISTANT Resistant     GENTAMICIN 4 SENSITIVE Sensitive     IMIPENEM <=0.25 SENSITIVE Sensitive     TRIMETH/SULFA <=20 SENSITIVE Sensitive     AMPICILLIN/SULBACTAM <=2 SENSITIVE Sensitive     PIP/TAZO <=4 SENSITIVE Sensitive     Extended ESBL NEGATIVE Sensitive     * ESCHERICHIA COLI  Culture, blood (routine x 2)     Status: None (Preliminary result)   Collection Time: 01/03/19  3:44 PM  Result Value Ref Range Status   Specimen Description   Final    BLOOD RIGHT HAND Performed at [place] [address], [place], [place] [address]    Special Requests   Final    BOTTLES DRAWN AEROBIC ONLY Blood Culture adequate volume Performed at [place] [address] [place], [place] [address]    Culture   Final    NO GROWTH 3 DAYS Performed at [facility], [place] [address], [place], [place] [address]    Report Status PENDING  Incomplete  Blood Culture ID Panel (Reflexed)     Status: Abnormal   Collection Time: 01/03/19  3:44 PM  Result Value Ref Range Status   Enterococcus species NOT DETECTED NOT DETECTED Final   Listeria monocytogenes NOT DETECTED  NOT DETECTED Final   Staphylococcus species NOT DETECTED NOT DETECTED Final   Staphylococcus aureus (BCID) NOT DETECTED NOT DETECTED Final   Streptococcus species NOT DETECTED NOT DETECTED Final   Streptococcus agalactiae NOT DETECTED NOT DETECTED Final   Streptococcus pneumoniae NOT DETECTED NOT DETECTED Final   Streptococcus pyogenes NOT DETECTED NOT DETECTED Final   Acinetobacter baumannii NOT DETECTED NOT DETECTED Final   Enterobacteriaceae species DETECTED (A) NOT DETECTED Final    Comment: Enterobacteriaceae represent a large family of gram-negative bacteria, not a single organism. CRITICAL RESULT  CALLED TO, READ BACK BY AND VERIFIED WITH: S CHRISTY PHARMD 2025 01/04/19 A BROWNING    Enterobacter cloacae complex NOT DETECTED NOT DETECTED Final   Escherichia coli DETECTED (A) NOT DETECTED Final    Comment: CRITICAL RESULT CALLED TO, READ BACK BY AND VERIFIED WITH: S CHRISTY PHARMD 2025 01/04/19 A BROWNING    Klebsiella oxytoca NOT DETECTED NOT DETECTED Final   Klebsiella pneumoniae NOT DETECTED NOT DETECTED Final   Proteus species NOT DETECTED NOT DETECTED Final   Serratia marcescens NOT DETECTED NOT DETECTED Final   Carbapenem resistance NOT DETECTED NOT DETECTED Final   Haemophilus influenzae NOT DETECTED NOT DETECTED Final   Neisseria meningitidis NOT DETECTED NOT DETECTED Final   Pseudomonas aeruginosa NOT DETECTED NOT DETECTED Final   Candida albicans NOT DETECTED NOT DETECTED Final   Candida glabrata NOT DETECTED NOT DETECTED Final   Candida krusei NOT DETECTED NOT DETECTED Final   Candida parapsilosis NOT DETECTED NOT DETECTED Final   Candida tropicalis NOT DETECTED NOT DETECTED Final    Comment: Performed at [facility], [place]. [address], [place], [place] [address]  Culture, Urine     Status: Abnormal   Collection Time: 01/03/19  5:53 PM  Result Value Ref Range Status   Specimen Description   Final    CYSTOSCOPY RIGHT KIDNEY Performed at [place] [address] [place], [place] [address]    Special Requests   Final    NONE Performed at [facility], [place] [address], [place], [place] [address]    Culture >=100,000 COLONIES/mL ESCHERICHIA COLI (A)  Final   Report Status 01/06/2019 FINAL  Final   Organism ID, Bacteria ESCHERICHIA COLI (A)  Final      Susceptibility   Escherichia coli - MIC*    AMPICILLIN 16 INTERMEDIATE Intermediate     CEFAZOLIN <=4 SENSITIVE Sensitive     CEFEPIME <=1 SENSITIVE Sensitive     CEFTAZIDIME <=1 SENSITIVE Sensitive     CEFTRIAXONE <=1 SENSITIVE Sensitive     CIPROFLOXACIN >=4 RESISTANT Resistant     GENTAMICIN <=1 SENSITIVE Sensitive     IMIPENEM <=0.25 SENSITIVE Sensitive     TRIMETH/SULFA <=20 SENSITIVE Sensitive     AMPICILLIN/SULBACTAM 4 SENSITIVE Sensitive     PIP/TAZO <=4 SENSITIVE Sensitive     Extended ESBL NEGATIVE Sensitive     * >=100,000 COLONIES/mL ESCHERICHIA COLI         Radiology Studies: No results found.      Scheduled Meds: . amitriptyline  25 mg Oral QHS  . apixaban  5 mg Oral BID  . carvedilol  3.125 mg Oral BID WC  . diltiazem  120 mg Oral q morning - 10a  . ferrous sulfate  325 mg Oral BID  . flecainide  75 mg Oral BID  . pantoprazole  40 mg Oral Daily  . polyethylene glycol  17 g Oral Daily  . senna-docusate  2 tablet Oral BID   Continuous Infusions: . cefTRIAXone (ROCEPHIN)  IV    . lactated ringers 75 mL/hr at 01/06/19 0135     LOS: 3 days     Georgette Shell, MD [facility]  If 7PM-7AM, please contact night-coverage [email or web address] Password [facility] 01/06/2019, 1:58 PM

## 2019-01-07 ENCOUNTER — Inpatient Hospital Stay (HOSPITAL_COMMUNITY): Payer: Medicare Other

## 2019-01-07 LAB — COMPREHENSIVE METABOLIC PANEL
ALT: 13 U/L (ref 0–44)
AST: 19 U/L (ref 15–41)
Albumin: 2.1 g/dL — ABNORMAL LOW (ref 3.5–5.0)
Alkaline Phosphatase: 59 U/L (ref 38–126)
Anion gap: 8 (ref 5–15)
BUN: 17 mg/dL (ref 8–23)
CO2: 26 mmol/L (ref 22–32)
Calcium: 8 mg/dL — ABNORMAL LOW (ref 8.9–10.3)
Chloride: 100 mmol/L (ref 98–111)
Creatinine, Ser: 0.65 mg/dL (ref 0.44–1.00)
GFR calc Af Amer: >60 mL/min (ref >60)
GFR calc non Af Amer: >60 mL/min (ref >60)
Glucose, Bld: 104 mg/dL — ABNORMAL HIGH (ref 70–99)
Potassium: 3.4 mmol/L — ABNORMAL LOW (ref 3.5–5.1)
Sodium: 134 mmol/L — ABNORMAL LOW (ref 135–145)
Total Bilirubin: 0.9 mg/dL (ref 0.3–1.2)
Total Protein: 5.6 g/dL — ABNORMAL LOW (ref 6.5–8.1)

## 2019-01-07 LAB — CBC
HCT: 41.7 % (ref 36.0–46.0)
Hemoglobin: 12.6 g/dL (ref 12.0–15.0)
MCH: 26.8 pg (ref 26.0–34.0)
MCHC: 30.2 g/dL (ref 30.0–36.0)
MCV: 88.7 fL (ref 80.0–100.0)
Platelets: 430 10*3/uL — ABNORMAL HIGH (ref 150–400)
RBC: 4.7 MIL/uL (ref 3.87–5.11)
RDW: 15.1 % (ref 11.5–15.5)
WBC: 16.9 10*3/uL — ABNORMAL HIGH (ref 4.0–10.5)
nRBC: 0 % (ref 0.0–0.2)

## 2019-01-07 MED ORDER — VANCOMYCIN HCL 10 G IV SOLR
1500.0000 mg | Freq: Once | INTRAVENOUS | Status: AC
  Administered 2019-01-07: 1500 mg via INTRAVENOUS
  Filled 2019-01-07: qty 1500

## 2019-01-07 MED ORDER — VANCOMYCIN HCL 10 G IV SOLR
1250.0000 mg | INTRAVENOUS | Status: DC
  Administered 2019-01-08 – 2019-01-10 (×3): 1250 mg via INTRAVENOUS
  Filled 2019-01-07 (×4): qty 1250

## 2019-01-07 MED ORDER — LOPERAMIDE HCL 2 MG PO CAPS
2.0000 mg | ORAL_CAPSULE | Freq: Four times a day (QID) | ORAL | Status: DC | PRN
  Administered 2019-01-07 – 2019-01-13 (×8): 2 mg via ORAL
  Filled 2019-01-07 (×8): qty 1

## 2019-01-07 MED ORDER — SACCHAROMYCES BOULARDII 250 MG PO CAPS
250.0000 mg | ORAL_CAPSULE | Freq: Two times a day (BID) | ORAL | Status: DC
  Administered 2019-01-07 – 2019-01-13 (×13): 250 mg via ORAL
  Filled 2019-01-07 (×13): qty 1

## 2019-01-07 NOTE — Progress Notes (Signed)
PT Cancellation Note  Patient Details Name: [name] [name] MRN: [id number] DOB: Apr 30, 1941   Cancelled Treatment:    Reason Eval/Treat Not Completed: Fatigue/lethargy limiting ability to participate  Pt had pain meds and wishes to nap at this time.  Pt politely declined PT and requested to ambulate later.  Nursing, please ambulate with pt when she is ready later today - THanks!  [name],[name] 01/07/2019, 3:15 PM [name], PT, DPT Acute Rehabilitation Services Office: [phone] Pager: [phone]

## 2019-01-07 NOTE — Progress Notes (Signed)
Pharmacy Antibiotic Note  [name] [name] is a 78 y.o. female admitted on 01/03/2019 with abdominal wall cellulitis.  Pharmacy has been consulted for vancomycin dosing.  Plan:  Vancomycin 1500 mg IV x1, then 1250 mg IV q24h  Ceftriaxone 1 gr IV q24 ( MD)   Monitor clinical course, renal function, cultures as available   Height: 5\' 7"  (170.2 cm) Weight: 199 lb 11.8 oz (90.6 kg) IBW/kg (Calculated) : 61.6  Temp (24hrs), Avg:98.7 F (37.1 C), Min:98.4 F (36.9 C), Max:98.9 F (37.2 C)  Recent Labs  Lab 01/03/19 1050 01/04/19 0739 01/05/19 0848 01/06/19 0426 01/07/19 1136  WBC 17.1* 18.3* 14.7* 16.1* 16.9*  CREATININE 1.20* 1.36* 1.11* 0.79 0.65    Estimated Creatinine Clearance: 69.1 mL/min (by C-G formula based on SCr of 0.65 mg/dL).    Allergies  Allergen Reactions  . Codeine Nausea Only and Other (See Comments)    hallucinations  . Hydrocodone Nausea Only and Other (See Comments)  . Macrodantin [Nitrofurantoin] Nausea And Vomiting    Antimicrobials this admission: 3/9 LVQ 750 x 1 3/9 CTX 1 gm x 1 3/9 meropenem>>3/12 3/12 ceftriaxone>> 3/13 vancomycin >>   Dose adjustments this admission:    Microbiology results: 3/9 Ucx x2>>100K Ecoli (R=Cipro, Amp; S=cephalosporins) 3/9 BCx2>> 1 of 4 E coli (sens except R=Cipro)  Thank you for allowing pharmacy to be a part of this patient's care.   Royetta Asal, PharmD, BCPS Pager [phone] 01/07/2019 2:37 PM

## 2019-01-07 NOTE — Progress Notes (Addendum)
PROGRESS NOTE    [name] [name]  ERX:[id number] DOB: [birth year] DOA: 01/03/2019 PCP: [name], MD   Brief Narrative:78 y.o.femalwith medical history significant forparoxysmal A. fib on Eliquis, chronic diastolic CHF, recurrent ureteral obstruction post ureteral stent placement every 5 months, hyperlipidemia, GERD who presented to [facility] ED with complaints of sudden onset severe right flank pain that started earlier this morning around 4 AM. Associated with chills. Last visit with her urologist was 2-3 weeks ago. She was thentreated with Bactrim and completed her course of antibiotic for UTI.No other changes to her medications.  Inthe ED, vital signsareremarkable for tachycardia with rate of 114,WBC 17 K and urine analysis positive for pyuria. CT abdomen pelvis with contrast done on 01/03/2019 reveals moderate to moderately severe right hydronephrosis with a right double-J ureteral stent in good position and extensive stranding about the right kidney and proximal ureter worse than on the prior exam and worrisome for urinary tract infection/urethritis. ED physician consulted urology. TRH asked to admit.  Taken to the OR by urology on 01/03/19 for replacement of her R ureter stent  Assessment & Plan:   Active Problems:   Hydronephrosis   Urinary tract infection without hematuria   Occlusion of ureteral stent (HCC)   Moderately severe right hydronephrosis withobstructed right stent Findings on CT abdomen and pelvis with contrast post right stent replacement on 01/03/2019 by Dr.Wrenn.  Urine culture and blood culture with E. coli sensitive to Rocephin.  Meropenem DC'd Rocephin started.  She is still have leukocytosis with low-grade temp.  Continue Rocephin for today.  Patient with 10 out of 10 pain at the right stent site.  Dilaudid given.  Will obtain ultrasound to see if there is any blockage.  Follow-up labs today.  Extensive cellulitis overlying the skin on the right  side abdomen.will add vanco also on rocephin. Addendum extensive perinephric fluid no hydro.  Sepsis secondary to complicated UTI with suspected right pyelonephritis  AKI, suspect post obstructive Baseline creatinine appears to be 0.90 with GFR greater than 60 Presented with creatinine of 1.20 with GFR of 44 On 01/04/2019 creatinine 1.3labs from today pending Continue to avoid nephrotoxic agents/dehydration/hypotension Continue to hold Lasix   History of recurrent ureteral obstruction Has ureteral stent in place Follows with urology outpatient Dr. Tresa Moore Self-reported ureteral stentarereplaced every 5 months Urology following    Paroxysmal A. Fib Rate controlled with Coreg and diltiazem Continue Coreg 6.25 mg twice daily, diltiazem 120 mg daily Continue Eliquis for primary CVA prevention  GERD Continue omeprazole  Chronic diastolic CHF patient is on 2 L of oxygen.  She reports she does not have oxygen at home.  Her ambulatory oxygen saturation dropped so she was placed on 2 L of oxygen.  Chest x-ray shows clear lungs.  Encourage her to use incentive spirometry as much as possible.  DVT prophylaxis:Eliquis  Code Status:Full code as confirmed by patient herself.  Family Communication:No family at bedside  Disposition Plan:Discharge to home when antibiotics arenarrow down from sensitivities and urology signs off.  Consults called:Urology  Estimated body mass index is 31.28 kg/m as calculated from the following:   Height as of this encounter: 5\' 7"  (1.702 m).   Weight as of this encounter: 90.6 kg.    Subjective: Patient was resting in bed when I saw her earlier today staff called me reporting patient is complaining of 10 out of 10 right sided abdominal pain the site of the stent.  Objective: Vitals:   01/06/19 1148 01/06/19 1348 01/06/19  2036 01/07/19 0636  BP: (!) 141/68 125/62 (!) 143/72 (!) 143/63  Pulse: 91 82 86 83  Resp: 18 18 20 19   Temp:  99.1 F (37.3 C) 98.2 F (36.8 C) 98.9 F (37.2 C) 98.4 F (36.9 C)  TempSrc: Axillary Oral Oral Oral  SpO2: 92% 92% 92% 95%  Weight:    90.6 kg  Height:        Intake/Output Summary (Last 24 hours) at 01/07/2019 1125 Last data filed at 01/07/2019 5956 Gross per 24 hour  Intake 1479.16 ml  Output 1225 ml  Net 254.16 ml   Filed Weights   01/05/19 0436 01/06/19 0500 01/07/19 0636  Weight: 89.1 kg 89.3 kg 90.6 kg    Examination:  General exam: Appears calm and comfortable  Respiratory system: Clear to auscultation. Respiratory effort normal. Cardiovascular system: S1 & S2 heard, RRR. No JVD, murmurs, rubs, gallops or clicks. No pedal edema. Gastrointestinal system: Abdomen is nondistended, soft and tender. No organomegaly or masses felt. Normal bowel sounds heard.right middle qudrant to post area erythema edema tender warmth Central nervous system: Alert and oriented. No focal neurological deficits. Extremities: Symmetric 5 x 5 power. Skin: No rashes, lesions or ulcers Psychiatry: Judgement and insight appear normal. Mood & affect appropriate.     Data Reviewed: I have personally reviewed following labs and imaging studies  CBC: Recent Labs  Lab 01/03/19 1050 01/04/19 0739 01/05/19 0848 01/06/19 0426  WBC 17.1* 18.3* 14.7* 16.1*  HGB 15.9* 14.5 13.6 13.1  HCT 52.0* 47.7* 44.3 43.3  MCV 88.9 90.5 89.3 89.1  PLT 653* 545* 444* 387*   Basic Metabolic Panel: Recent Labs  Lab 01/03/19 1050 01/04/19 0739 01/05/19 0848 01/06/19 0426  NA 135 137 136 137  K 3.7 4.3 3.9 4.0  CL 107 105 102 103  CO2 16* 21* 23 24  GLUCOSE 146* 104* 122* 99  BUN 22 31* 33* 26*  CREATININE 1.20* 1.36* 1.11* 0.79  CALCIUM 8.9 8.1* 8.5* 8.4*   GFR: Estimated Creatinine Clearance: 69.1 mL/min (by C-G formula based on SCr of 0.79 mg/dL). Liver Function Tests: Recent Labs  Lab 01/03/19 1050  AST 23  ALT 13  ALKPHOS 79  BILITOT 1.2  PROT 7.5  ALBUMIN 3.7   Recent Labs  Lab  01/03/19 1048  LIPASE 29   No results for input(s): AMMONIA in the last 168 hours. Coagulation Profile: No results for input(s): INR, PROTIME in the last 168 hours. Cardiac Enzymes: Recent Labs  Lab 01/03/19 1050  TROPONINI <0.03   BNP (last 3 results) No results for input(s): PROBNP in the last 8760 hours. HbA1C: No results for input(s): HGBA1C in the last 72 hours. CBG: No results for input(s): GLUCAP in the last 168 hours. Lipid Profile: No results for input(s): CHOL, HDL, LDLCALC, TRIG, CHOLHDL, LDLDIRECT in the last 72 hours. Thyroid Function Tests: No results for input(s): TSH, T4TOTAL, FREET4, T3FREE, THYROIDAB in the last 72 hours. Anemia Panel: No results for input(s): VITAMINB12, FOLATE, FERRITIN, TIBC, IRON, RETICCTPCT in the last 72 hours. Sepsis Labs: No results for input(s): PROCALCITON, LATICACIDVEN in the last 168 hours.  Recent Results (from the past 240 hour(s))  Urine Culture     Status: Abnormal   Collection Time: 01/03/19 10:50 AM  Result Value Ref Range Status   Specimen Description   Final    URINE, CLEAN CATCH Performed at [facility], [place] [address] [place], [place] [address]    Special Requests   Final  NONE Performed at [facility], [place] [address] [place], [place] [address]    Culture >=100,000 COLONIES/mL ESCHERICHIA COLI (A)  Final   Report Status 01/06/2019 FINAL  Final   Organism ID, Bacteria ESCHERICHIA COLI (A)  Final      Susceptibility   Escherichia coli - MIC*    AMPICILLIN 16 INTERMEDIATE Intermediate     CEFAZOLIN <=4 SENSITIVE Sensitive     CEFTRIAXONE <=1 SENSITIVE Sensitive     CIPROFLOXACIN >=4 RESISTANT Resistant     GENTAMICIN 4 SENSITIVE Sensitive     IMIPENEM 0.5 SENSITIVE Sensitive     NITROFURANTOIN <=16 SENSITIVE Sensitive     TRIMETH/SULFA <=20 SENSITIVE Sensitive     AMPICILLIN/SULBACTAM 4 SENSITIVE Sensitive     PIP/TAZO 8 SENSITIVE Sensitive     Extended ESBL  NEGATIVE Sensitive     * >=100,000 COLONIES/mL ESCHERICHIA COLI  Culture, blood (routine x 2)     Status: Abnormal   Collection Time: 01/03/19  3:44 PM  Result Value Ref Range Status   Specimen Description   Final    BLOOD LEFT ARM Performed at [facility] [address], [place], [place] [address]    Special Requests   Final    BOTTLES DRAWN AEROBIC ONLY Blood Culture adequate volume Performed at [facility] [address], [place], [place] [address]    Culture  Setup Time   Final    GRAM NEGATIVE RODS AEROBIC BOTTLE ONLY CRITICAL RESULT CALLED TO, READ BACK BY AND VERIFIED WITH: S CHRISTY PHARMD 2025 01/04/19 A BROWNING Performed at [facility], [place] [address], [place], [place] [address]    Culture ESCHERICHIA COLI (A)  Final   Report Status 01/06/2019 FINAL  Final   Organism ID, Bacteria ESCHERICHIA COLI  Final      Susceptibility   Escherichia coli - MIC*    AMPICILLIN <=2 SENSITIVE Sensitive     CEFAZOLIN <=4 SENSITIVE Sensitive     CEFEPIME <=1 SENSITIVE Sensitive     CEFTAZIDIME <=1 SENSITIVE Sensitive     CEFTRIAXONE <=1 SENSITIVE Sensitive     CIPROFLOXACIN >=4 RESISTANT Resistant     GENTAMICIN 4 SENSITIVE Sensitive     IMIPENEM <=0.25 SENSITIVE Sensitive     TRIMETH/SULFA <=20 SENSITIVE Sensitive     AMPICILLIN/SULBACTAM <=2 SENSITIVE Sensitive     PIP/TAZO <=4 SENSITIVE Sensitive     Extended ESBL NEGATIVE Sensitive     * ESCHERICHIA COLI  Culture, blood (routine x 2)     Status: None (Preliminary result)   Collection Time: 01/03/19  3:44 PM  Result Value Ref Range Status   Specimen Description   Final    BLOOD RIGHT HAND Performed at [facility] [address] [place], [place] [address]    Special Requests   Final    BOTTLES DRAWN AEROBIC ONLY Blood Culture adequate volume Performed at [facility] [address], [place], [place] [address]    Culture   Final    NO  GROWTH 3 DAYS Performed at [facility], [place] [address], [place], [place] [address]    Report Status PENDING  Incomplete  Blood Culture ID Panel (Reflexed)     Status: Abnormal   Collection Time: 01/03/19  3:44 PM  Result Value Ref Range Status   Enterococcus species NOT DETECTED NOT DETECTED Final   Listeria monocytogenes NOT DETECTED NOT DETECTED Final   Staphylococcus species NOT DETECTED NOT DETECTED Final  Staphylococcus aureus (BCID) NOT DETECTED NOT DETECTED Final   Streptococcus species NOT DETECTED NOT DETECTED Final   Streptococcus agalactiae NOT DETECTED NOT DETECTED Final   Streptococcus pneumoniae NOT DETECTED NOT DETECTED Final   Streptococcus pyogenes NOT DETECTED NOT DETECTED Final   Acinetobacter baumannii NOT DETECTED NOT DETECTED Final   Enterobacteriaceae species DETECTED (A) NOT DETECTED Final    Comment: Enterobacteriaceae represent a large family of gram-negative bacteria, not a single organism. CRITICAL RESULT CALLED TO, READ BACK BY AND VERIFIED WITH: S CHRISTY PHARMD 2025 01/04/19 A BROWNING    Enterobacter cloacae complex NOT DETECTED NOT DETECTED Final   Escherichia coli DETECTED (A) NOT DETECTED Final    Comment: CRITICAL RESULT CALLED TO, READ BACK BY AND VERIFIED WITH: S CHRISTY PHARMD 2025 01/04/19 A BROWNING    Klebsiella oxytoca NOT DETECTED NOT DETECTED Final   Klebsiella pneumoniae NOT DETECTED NOT DETECTED Final   Proteus species NOT DETECTED NOT DETECTED Final   Serratia marcescens NOT DETECTED NOT DETECTED Final   Carbapenem resistance NOT DETECTED NOT DETECTED Final   Haemophilus influenzae NOT DETECTED NOT DETECTED Final   Neisseria meningitidis NOT DETECTED NOT DETECTED Final   Pseudomonas aeruginosa NOT DETECTED NOT DETECTED Final   Candida albicans NOT DETECTED NOT DETECTED Final   Candida glabrata NOT DETECTED NOT DETECTED Final   Candida krusei NOT DETECTED NOT DETECTED Final   Candida parapsilosis NOT DETECTED NOT DETECTED Final    Candida tropicalis NOT DETECTED NOT DETECTED Final    Comment: Performed at [facility], [place]. [address] [place], [place] [address]  Culture, Urine     Status: Abnormal   Collection Time: 01/03/19  5:53 PM  Result Value Ref Range Status   Specimen Description   Final    CYSTOSCOPY RIGHT KIDNEY Performed at [facility] [address] [place], [place] [address]    Special Requests   Final    NONE Performed at [facility], [place] [address], [place], [place] [address]    Culture >=100,000 COLONIES/mL ESCHERICHIA COLI (A)  Final   Report Status 01/06/2019 FINAL  Final   Organism ID, Bacteria ESCHERICHIA COLI (A)  Final      Susceptibility   Escherichia coli - MIC*    AMPICILLIN 16 INTERMEDIATE Intermediate     CEFAZOLIN <=4 SENSITIVE Sensitive     CEFEPIME <=1 SENSITIVE Sensitive     CEFTAZIDIME <=1 SENSITIVE Sensitive     CEFTRIAXONE <=1 SENSITIVE Sensitive     CIPROFLOXACIN >=4 RESISTANT Resistant     GENTAMICIN <=1 SENSITIVE Sensitive     IMIPENEM <=0.25 SENSITIVE Sensitive     TRIMETH/SULFA <=20 SENSITIVE Sensitive     AMPICILLIN/SULBACTAM 4 SENSITIVE Sensitive     PIP/TAZO <=4 SENSITIVE Sensitive     Extended ESBL NEGATIVE Sensitive     * >=100,000 COLONIES/mL ESCHERICHIA COLI         Radiology Studies: Dg Chest 1 View  Result Date: 01/06/2019 CLINICAL DATA:  Fever and shortness of breath on exertion. EXAM: CHEST  1 VIEW COMPARISON:  PA and lateral chest 01/03/2019 and 07/28/2017. CT abdomen and pelvis 01/03/2019. FINDINGS: The lungs are clear clear. Prominent epicardial fat at the left cardiophrenic angle noted, unchanged. No pneumothorax or pleural effusion. Atherosclerosis noted. Heart size is upper normal. IMPRESSION: No acute disease. Electronically Signed   By: Inge Rise M.D.   On: 01/06/2019 15:25        Scheduled Meds: . amitriptyline  25 mg Oral QHS  .  apixaban  5 mg Oral BID  . carvedilol  3.125 mg  Oral BID WC  . diltiazem  120 mg Oral q morning - 10a  . ferrous sulfate  325 mg Oral BID  . flecainide  75 mg Oral BID  . pantoprazole  40 mg Oral Daily  . polyethylene glycol  17 g Oral Daily  . senna-docusate  2 tablet Oral BID   Continuous Infusions: . cefTRIAXone (ROCEPHIN)  IV 2 g (01/06/19 1630)  . lactated ringers 75 mL/hr at 01/07/19 2878     LOS: 4 days     Georgette Shell, MD [facility]  If 7PM-7AM, please contact night-coverage [email or web address] Password [facility] 01/07/2019, 11:25 AM

## 2019-01-08 ENCOUNTER — Inpatient Hospital Stay (HOSPITAL_COMMUNITY): Payer: Medicare Other

## 2019-01-08 LAB — BASIC METABOLIC PANEL
Anion gap: 10 (ref 5–15)
BUN: 16 mg/dL (ref 8–23)
CO2: 29 mmol/L (ref 22–32)
Calcium: 8 mg/dL — ABNORMAL LOW (ref 8.9–10.3)
Chloride: 97 mmol/L — ABNORMAL LOW (ref 98–111)
Creatinine, Ser: 0.74 mg/dL (ref 0.44–1.00)
GFR calc Af Amer: >60 mL/min (ref >60)
GFR calc non Af Amer: >60 mL/min (ref >60)
Glucose, Bld: 84 mg/dL (ref 70–99)
Potassium: 3.1 mmol/L — ABNORMAL LOW (ref 3.5–5.1)
Sodium: 136 mmol/L (ref 135–145)

## 2019-01-08 LAB — CBC
HCT: 41.7 % (ref 36.0–46.0)
Hemoglobin: 12.7 g/dL (ref 12.0–15.0)
MCH: 27.4 pg (ref 26.0–34.0)
MCHC: 30.5 g/dL (ref 30.0–36.0)
MCV: 90.1 fL (ref 80.0–100.0)
Platelets: 450 10*3/uL — ABNORMAL HIGH (ref 150–400)
RBC: 4.63 MIL/uL (ref 3.87–5.11)
RDW: 15.2 % (ref 11.5–15.5)
WBC: 16.4 10*3/uL — ABNORMAL HIGH (ref 4.0–10.5)
nRBC: 0 % (ref 0.0–0.2)

## 2019-01-08 LAB — CULTURE, BLOOD (ROUTINE X 2)
Culture: NO GROWTH
Special Requests: ADEQUATE

## 2019-01-08 MED ORDER — HEPARIN (PORCINE) 25000 UT/250ML-% IV SOLN
1200.0000 [IU]/h | INTRAVENOUS | Status: DC
  Administered 2019-01-08: 1200 [IU]/h via INTRAVENOUS
  Filled 2019-01-08: qty 250

## 2019-01-08 MED ORDER — IOHEXOL 300 MG/ML  SOLN
100.0000 mL | Freq: Once | INTRAMUSCULAR | Status: AC | PRN
  Administered 2019-01-08: 100 mL via INTRAVENOUS

## 2019-01-08 MED ORDER — POTASSIUM CHLORIDE CRYS ER 20 MEQ PO TBCR
40.0000 meq | EXTENDED_RELEASE_TABLET | Freq: Once | ORAL | Status: AC
  Administered 2019-01-08: 40 meq via ORAL
  Filled 2019-01-08: qty 2

## 2019-01-08 MED ORDER — SODIUM CHLORIDE (PF) 0.9 % IJ SOLN
INTRAMUSCULAR | Status: AC
  Administered 2019-01-08: 10 mL
  Filled 2019-01-08: qty 50

## 2019-01-08 NOTE — Progress Notes (Signed)
5 Days Post-Op   Subjective/Chief Complaint:    1 - Rt Chronic Hydronephrosis / Partial Ureteral Stricture- Multiple prior stone procedures on right including SWL, ureteroscopy, and neph tube.    Recent Course:  02/2018 - Exchange Rt 8x24 polaris stent; 08/2018 Exchange Rt 8x24 polaris stent 01/03/2019 - Exchange Rt 8x24 polaris stent as some hydro / stranding concerning for possible stent occlusion.    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 [place], [place] 01/03/19 - e.coli  Res Cipro, but sens others. Now on rocephin + VAnc.  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   Today [place] is without high fevers, but now with some Rt flank erythema and tenderness. Exam and imaging c/w worsened Rt flank soft tissue infection.     Objective: Vital signs in last 24 hours: Temp:  [98.3 F (36.8 C)-99.8 F (37.7 C)] 98.3 F (36.8 C) (03/14 0614) Pulse Rate:  [80-92] 81 (03/14 0614) Resp:  [14-22] 14 (03/14 0614) BP: (138-145)/(57-72) 138/72 (03/14 0614) SpO2:  [90 %-95 %] 95 % (03/14 0614) Weight:  [89.9 kg] 89.9 kg (03/14 0500) Last BM Date: 01/07/19  Intake/Output from previous day: 03/13 0701 - 03/14 0700 In: 1559.8 [P.O.:240; I.V.:946.9; IV Piggyback:373] Out: 525 [Urine:525] Intake/Output this shift: No intake/output data recorded.   General appearance: alert, cooperative and at baseline, very pleasant Eyes: negative Nose: Nares normal. Septum midline. Mucosa normal. No drainage or sinus tenderness. Throat: lips, mucosa, and tongue normal; teeth and gums normal Neck: supple, symmetrical, trachea midline Back: symmetric, no curvature. ROM normal. New Rt flank soft tissue erythema that blanches and edema. Borders marked and noted. NO drainage / fluctuence /  crepitus.  Resp: non-labored on room air.  Cardio: mild tachycardia.  Extremities: extremities normal, atraumatic, no cyanosis or edema Lymph nodes: Cervical, supraclavicular, and axillary nodes normal.  Lab Results:  Recent Labs    01/07/19 1136 01/08/19 0631  WBC 16.9* 16.4*  HGB 12.6 12.7  HCT 41.7 41.7  PLT 430* 450*   BMET Recent Labs    01/07/19 1136 01/08/19 0631  NA 134* 136  K 3.4* 3.1*  CL 100 97*  CO2 26 29  GLUCOSE 104* 84  BUN 17 16  CREATININE 0.65 0.74  CALCIUM 8.0* 8.0*   PT/INR No results for input(s): LABPROT, INR in the last 72 hours. ABG No results for input(s): PHART, HCO3 in the last 72 hours.  Invalid input(s): PCO2, PO2  Studies/Results: Dg Chest 1 View  Result Date: 01/06/2019 CLINICAL DATA:  Fever and shortness of breath on exertion. EXAM: CHEST  1 VIEW COMPARISON:  PA and lateral chest 01/03/2019 and 07/28/2017. CT abdomen and pelvis 01/03/2019. FINDINGS: The lungs are clear clear. Prominent epicardial fat at the left cardiophrenic angle noted, unchanged. No pneumothorax or pleural effusion. Atherosclerosis noted. Heart size is upper normal. IMPRESSION: No acute disease. Electronically Signed   By: Inge Rise M.D.   On: 01/06/2019 15:25   Ct Abdomen Pelvis W Contrast  Result Date: 01/08/2019 CLINICAL DATA:  78 year old female with acute RIGHT flank pain following stent placement on 01/03/2019. RIGHT flank erythema. EXAM: CT ABDOMEN AND PELVIS WITH CONTRAST TECHNIQUE: Multidetector CT imaging of the abdomen and pelvis was performed using the standard protocol following bolus administration of intravenous contrast. CONTRAST:  121mL OMNIPAQUE IOHEXOL 300 MG/ML  SOLN COMPARISON:  01/07/2019 ultrasound and 01/03/2019 CT FINDINGS: Lower chest: New small RIGHT pleural effusion noted with increasing bibasilar atelectasis. Cardiomegaly again noted. Hepatobiliary: No hepatic abnormalities. The patient is status post cholecystectomy. No biliary  dilatation. Pancreas: Unremarkable Spleen: Unremarkable Adrenals/Urinary Tract: A RIGHT ureteral stent is noted with tip and posterior UPPER RIGHT kidney and bladder. Hydronephrosis has slightly decreased. Gas in the renal collecting system may be related from recent procedure. Increasing RIGHT perinephric inflammation/fluid identified with new inflammation in the RIGHT subcutaneous fat overlying the RIGHT kidney. Bilateral renal cortical atrophy noted with unchanged LEFT renal cyst. Gas in the bladder is likely from recent procedure. The adrenal glands are unremarkable. Stomach/Bowel: Stomach is within normal limits. No evidence of bowel wall thickening, distention, or inflammatory changes. Vascular/Lymphatic: Aortic atherosclerosis. No enlarged abdominal or pelvic lymph nodes. Reproductive: Status post hysterectomy. No adnexal masses. Pessary again noted Other: No ascites or pneumoperitoneum. Musculoskeletal: No acute abnormality. L2 compression fracture, L3-L5 lumbar fusion changes and RIGHT total hip arthroplasty changes again noted. IMPRESSION: 1. Increasing RIGHT perinephric inflammation and fluid as well as new inflammation in the RIGHT subcutaneous fat overlying the RIGHT renal region, and may represent infection. 2. New small RIGHT pleural effusion and increasing bibasilar atelectasis. 3. RIGHT ureteral stent with decreased RIGHT hydronephrosis. Gas in the renal collecting system and bladder may be from recent procedure versus infection. Electronically Signed   By: Margarette Canada M.D.   On: 01/08/2019 11:13   US Renal  Result Date: 01/07/2019 CLINICAL DATA:  Right flank pain.  Right ureteral stent EXAM: RENAL / URINARY TRACT ULTRASOUND COMPLETE COMPARISON:  Abdomen pelvis CT dated 01/03/2019. FINDINGS: Right Kidney: Renal measurements: 13.5 x 6.7 x 6.0 cm = volume: 283 mL . Echogenicity within normal limits. No mass or hydronephrosis visualized. Extensive perinephric fluid. Left Kidney: Renal measurements:  13.6 x 6.5 x 6.1 cm = volume: 285 mL. Echogenicity within normal limits. 5.5 cm upper pole cyst. No mass or hydronephrosis visualized. Bladder: Not distended with a Foley catheter in place. IMPRESSION: 1. Extensive right perinephric fluid with no visible residual hydronephrosis. 2. 5.5 cm simple left renal cyst. Electronically Signed   By: Claudie Revering M.D.   On: 01/07/2019 12:43    Anti-infectives: Anti-infectives (From admission, onward)   Start     Dose/Rate Route Frequency Ordered Stop   01/08/19 1400  vancomycin (VANCOCIN) 1,250 mg in sodium chloride 0.9 % 250 mL IVPB     1,250 mg 166.7 mL/hr over 90 Minutes Intravenous Every 24 hours 01/07/19 1437     01/07/19 1500  vancomycin (VANCOCIN) 1,500 mg in sodium chloride 0.9 % 500 mL IVPB     1,500 mg 250 mL/hr over 120 Minutes Intravenous  Once 01/07/19 1437 01/07/19 1854   01/06/19 1400  ceFAZolin (ANCEF) IVPB 2g/100 mL premix  Status:  Discontinued     2 g 200 mL/hr over 30 Minutes Intravenous Every 8 hours 01/06/19 1051 01/06/19 1138   01/06/19 1400  cefTRIAXone (ROCEPHIN) 2 g in sodium chloride 0.9 % 100 mL IVPB     2 g 200 mL/hr over 30 Minutes Intravenous Every 24 hours 01/06/19 1138     01/04/19 1800  meropenem (MERREM) 1 g in sodium chloride 0.9 % 100 mL IVPB  Status:  Discontinued     1 g 200 mL/hr over 30 Minutes Intravenous Every 12 hours 01/04/19 1028 01/06/19 1051   01/03/19 2200  meropenem (MERREM) 1 g in sodium chloride 0.9 % 100 mL IVPB  Status:  Discontinued  1 g 200 mL/hr over 30 Minutes Intravenous Every 8 hours 01/03/19 1844 01/04/19 1028   01/03/19 1900  cefTRIAXone (ROCEPHIN) 1 g in sodium chloride 0.9 % 100 mL IVPB     1 g 200 mL/hr over 30 Minutes Intravenous  Once 01/03/19 1846 01/03/19 1919   01/03/19 1823  sodium chloride 0.9 % with cefTRIAXone (ROCEPHIN) ADS Med    Note to Pharmacy:  Ward, Christa   : cabinet override      01/03/19 1823 01/04/19 0629   01/03/19 1652  cefTRIAXone (ROCEPHIN) 1 g in sodium  chloride 0.9 % 100 mL IVPB  Status:  Discontinued     1 g 200 mL/hr over 30 Minutes Intravenous 30 min pre-op 01/03/19 1653 01/03/19 1844   01/03/19 1633  cefTRIAXone (ROCEPHIN) 2 g in sodium chloride 0.9 % 100 mL IVPB  Status:  Discontinued     2 g 200 mL/hr over 30 Minutes Intravenous 30 min pre-op 01/03/19 1633 01/03/19 1653   01/03/19 1500  cefTRIAXone (ROCEPHIN) 1 g in sodium chloride 0.9 % 100 mL IVPB  Status:  Discontinued     1 g 200 mL/hr over 30 Minutes Intravenous Every 24 hours 01/03/19 1444 01/03/19 1824   01/03/19 1345  levofloxacin (LEVAQUIN) IVPB 750 mg  Status:  Discontinued     750 mg 100 mL/hr over 90 Minutes Intravenous  Once 01/03/19 1332 01/03/19 1540      Assessment/Plan:  1 - Rt Chronic Hydronephrosis / Partial Ureteral Stricture- stent exchanged, internal renal drainage is adequate.    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 AS she is not systemically ill, favor contineud CX-specific ABX x 48 hours, hold eliquus, and if not significnatly improved, then likely IR perinephric drain on Monday. Would consider more urgently if systemic parmaters become less favorable.   Alexis Frock 01/08/2019

## 2019-01-08 NOTE — Progress Notes (Signed)
PROGRESS NOTE    [name] [name]  XBM:[id number] DOB: [birth year] DOA: 01/03/2019 PCP: [name], MD   Brief Narrative:77 y.o.femalewith medical history significant forparoxysmal A. fib on Eliquis, chronic diastolic CHF, recurrent ureteral obstruction post ureteral stent placement every 5 months, hyperlipidemia, GERD who presented to [facility] ED with complaints of sudden onset severe right flank pain that started earlier this morning around 4 AM. Associated with chills. Last visit with her urologist was 2-3 weeks ago. She was thentreated with Bactrim and completed her course of antibiotic for UTI.No other changes to her medications.  Inthe ED, vital signsareremarkable for tachycardia with rate of 114,WBC 17 K and urine analysis positive for pyuria. CT abdomen pelvis with contrast done on 01/03/2019 reveals moderate to moderately severe right hydronephrosis with a right double-J ureteral stent in good position and extensive stranding about the right kidney and proximal ureter worse than on the prior exam and worrisome for urinary tract infection/urethritis. ED physician consulted urology. TRH asked to admit  Taken to the OR by urology on 01/03/19 for replacement of her R ureter stent  Assessment & Plan:   Active Problems:   Hydronephrosis   Urinary tract infection without hematuria   Occlusion of ureteral stent (HCC)  Moderately severe right hydronephrosis withobstructed right stent Findings on CT abdomen and pelvis with contrast post right stent replacement on 01/03/2019 by Dr.Wrenn. Urine culture and blood culture with E. coli sensitive to Rocephin. Meropenem DC'd Rocephin started.  Patient still complaining of severe pain to the right side of her abdomen still remains swollen and red.  Ultrasound shows perinephric fluid collection extensive.  Will order CT of the abdomen and pelvis and continue vancomycin and Rocephin.    Sepsis secondary to complicated UTI with  suspected right pyelonephritis  AKI, suspect post obstructive has been resolved.  Patient was on Lasix at home which is on hold at this time.  History of recurrent ureteral obstruction Has ureteral stent in place Follows with urology outpatient Dr. Tresa Moore Self-reported ureteral stentarereplaced every 5 months Urology following discussed with Dr. Tresa Moore  Paroxysmal A. Fib Rate controlled with Coreg and diltiazem Continue Coreg 6.25 mg twice daily, diltiazem 120 mg daily Continue Eliquis for primary CVA prevention  GERD Continue omeprazole  Chronic diastolic CHF patient is on 2 L of oxygen.  She reports she does not have oxygen at home.  Her ambulatory oxygen saturation dropped so she was placed on 2 L of oxygen.  Chest x-ray shows clear lungs.  Encourage her to use incentive spirometry as much as possible.  DVT prophylaxis:Eliquis  Code Status:Full code as confirmed by patient herself.  Family Communication:No family at bedside  Disposition Plan:Discharge to home when antibiotics arenarrow down from sensitivities and urology signs off.   Estimated body mass index is 31.04 kg/m as calculated from the following:   Height as of this encounter: 5\' 7"  (1.702 m).   Weight as of this encounter: 89.9 kg.   Subjective: Patient continues with severe pain in the right side of the abdomen taking Dilaudid overnight husband by the bedside  Objective: Vitals:   01/07/19 1500 01/07/19 2144 01/08/19 0500 01/08/19 0614  BP: (!) 144/57 (!) 145/69  138/72  Pulse: 80 92  81  Resp: (!) 22 15  14   Temp: 98.9 F (37.2 C) 99.8 F (37.7 C)  98.3 F (36.8 C)  TempSrc: Oral Oral  Oral  SpO2: 90% 92%  95%  Weight:   89.9 kg   Height:  Intake/Output Summary (Last 24 hours) at 01/08/2019 0910 Last data filed at 01/08/2019 0300 Gross per 24 hour  Intake 1559.81 ml  Output 525 ml  Net 1034.81 ml   Filed Weights   01/06/19 0500 01/07/19 0636 01/08/19 0500  Weight:  89.3 kg 90.6 kg 89.9 kg    Examination:  General exam: Appears calm and comfortable  Respiratory system: Clear to auscultation. Respiratory effort normal. Cardiovascular system: S1 & S2 heard, RRR. No JVD, murmurs, rubs, gallops or clicks. No pedal edema. Gastrointestinal system: Abdomen is nondistended, soft and nontender. No organomegaly or masses felt. Normal bowel sounds heard.  Extensive erythema to the right side of her abdomen at the site of surgery but better than yesterday with decreasing erythema. Central nervous system: Alert and oriented. No focal neurological deficits. Extremities: Symmetric 5 x 5 power. Skin: No rashes, lesions or ulcers Psychiatry: Judgement and insight appear normal. Mood & affect appropriate.     Data Reviewed: I have personally reviewed following labs and imaging studies  CBC: Recent Labs  Lab 01/03/19 1050 01/04/19 0739 01/05/19 0848 01/06/19 0426 01/07/19 1136  WBC 17.1* 18.3* 14.7* 16.1* 16.9*  HGB 15.9* 14.5 13.6 13.1 12.6  HCT 52.0* 47.7* 44.3 43.3 41.7  MCV 88.9 90.5 89.3 89.1 88.7  PLT 653* 545* 444* 431* 948*   Basic Metabolic Panel: Recent Labs  Lab 01/04/19 0739 01/05/19 0848 01/06/19 0426 01/07/19 1136 01/08/19 0631  NA 137 136 137 134* 136  K 4.3 3.9 4.0 3.4* 3.1*  CL 105 102 103 100 97*  CO2 21* 23 24 26 29   GLUCOSE 104* 122* 99 104* 84  BUN 31* 33* 26* 17 16  CREATININE 1.36* 1.11* 0.79 0.65 0.74  CALCIUM 8.1* 8.5* 8.4* 8.0* 8.0*   GFR: Estimated Creatinine Clearance: 68.9 mL/min (by C-G formula based on SCr of 0.74 mg/dL). Liver Function Tests: Recent Labs  Lab 01/03/19 1050 01/07/19 1136  AST 23 19  ALT 13 13  ALKPHOS 79 59  BILITOT 1.2 0.9  PROT 7.5 5.6*  ALBUMIN 3.7 2.1*   Recent Labs  Lab 01/03/19 1048  LIPASE 29   No results for input(s): AMMONIA in the last 168 hours. Coagulation Profile: No results for input(s): INR, PROTIME in the last 168 hours. Cardiac Enzymes: Recent Labs  Lab  01/03/19 1050  TROPONINI <0.03   BNP (last 3 results) No results for input(s): PROBNP in the last 8760 hours. HbA1C: No results for input(s): HGBA1C in the last 72 hours. CBG: No results for input(s): GLUCAP in the last 168 hours. Lipid Profile: No results for input(s): CHOL, HDL, LDLCALC, TRIG, CHOLHDL, LDLDIRECT in the last 72 hours. Thyroid Function Tests: No results for input(s): TSH, T4TOTAL, FREET4, T3FREE, THYROIDAB in the last 72 hours. Anemia Panel: No results for input(s): VITAMINB12, FOLATE, FERRITIN, TIBC, IRON, RETICCTPCT in the last 72 hours. Sepsis Labs: No results for input(s): PROCALCITON, LATICACIDVEN in the last 168 hours.  Recent Results (from the past 240 hour(s))  Urine Culture     Status: Abnormal   Collection Time: 01/03/19 10:50 AM  Result Value Ref Range Status   Specimen Description   Final    URINE, CLEAN CATCH Performed at [facility], [place] [address] [place], [place] [address]    Special Requests   Final    NONE Performed at [facility], [place] [address], [place], [place] [address]    Culture >=100,000 COLONIES/mL ESCHERICHIA COLI (A)  Final   Report Status 01/06/2019 FINAL  Final   Organism ID, Bacteria ESCHERICHIA COLI (A)  Final      Susceptibility   Escherichia coli - MIC*    AMPICILLIN 16 INTERMEDIATE Intermediate     CEFAZOLIN <=4 SENSITIVE Sensitive     CEFTRIAXONE <=1 SENSITIVE Sensitive     CIPROFLOXACIN >=4 RESISTANT Resistant     GENTAMICIN 4 SENSITIVE Sensitive     IMIPENEM 0.5 SENSITIVE Sensitive     NITROFURANTOIN <=16 SENSITIVE Sensitive     TRIMETH/SULFA <=20 SENSITIVE Sensitive     AMPICILLIN/SULBACTAM 4 SENSITIVE Sensitive     PIP/TAZO 8 SENSITIVE Sensitive     Extended ESBL NEGATIVE Sensitive     * >=100,000 COLONIES/mL ESCHERICHIA COLI  Culture, blood (routine x 2)     Status: Abnormal   Collection Time: 01/03/19  3:44 PM  Result Value Ref Range Status   Specimen Description    Final    BLOOD LEFT ARM Performed at [facility] [address] [place], [place] [address]    Special Requests   Final    BOTTLES DRAWN AEROBIC ONLY Blood Culture adequate volume Performed at [facility] [address] [place], [place] [address]    Culture  Setup Time   Final    GRAM NEGATIVE RODS AEROBIC BOTTLE ONLY CRITICAL RESULT CALLED TO, READ BACK BY AND VERIFIED WITH: S CHRISTY PHARMD 2025 01/04/19 A BROWNING Performed at [facility], [place] [address] [place], [place] [address]    Culture ESCHERICHIA COLI (A)  Final   Report Status 01/06/2019 FINAL  Final   Organism ID, Bacteria ESCHERICHIA COLI  Final      Susceptibility   Escherichia coli - MIC*    AMPICILLIN <=2 SENSITIVE Sensitive     CEFAZOLIN <=4 SENSITIVE Sensitive     CEFEPIME <=1 SENSITIVE Sensitive     CEFTAZIDIME <=1 SENSITIVE Sensitive     CEFTRIAXONE <=1 SENSITIVE Sensitive     CIPROFLOXACIN >=4 RESISTANT Resistant     GENTAMICIN 4 SENSITIVE Sensitive     IMIPENEM <=0.25 SENSITIVE Sensitive     TRIMETH/SULFA <=20 SENSITIVE Sensitive     AMPICILLIN/SULBACTAM <=2 SENSITIVE Sensitive     PIP/TAZO <=4 SENSITIVE Sensitive     Extended ESBL NEGATIVE Sensitive     * ESCHERICHIA COLI  Culture, blood (routine x 2)     Status: None (Preliminary result)   Collection Time: 01/03/19  3:44 PM  Result Value Ref Range Status   Specimen Description   Final    BLOOD RIGHT HAND Performed at [facility] [address], [place], [place] [address]    Special Requests   Final    BOTTLES DRAWN AEROBIC ONLY Blood Culture adequate volume Performed at [facility] [address] [place], [place] [address]    Culture   Final    NO GROWTH 4 DAYS Performed at [facility], [place] [address] [place], [place] [address]    Report Status PENDING  Incomplete  Blood Culture ID Panel (Reflexed)     Status: Abnormal   Collection Time:  01/03/19  3:44 PM  Result Value Ref Range Status   Enterococcus species NOT DETECTED NOT DETECTED Final   Listeria monocytogenes NOT DETECTED NOT DETECTED Final   Staphylococcus species NOT DETECTED NOT DETECTED Final   Staphylococcus aureus (BCID) NOT DETECTED NOT DETECTED Final   Streptococcus species NOT DETECTED NOT DETECTED Final   Streptococcus agalactiae NOT DETECTED NOT DETECTED Final   Streptococcus pneumoniae  NOT DETECTED NOT DETECTED Final   Streptococcus pyogenes NOT DETECTED NOT DETECTED Final   Acinetobacter baumannii NOT DETECTED NOT DETECTED Final   Enterobacteriaceae species DETECTED (A) NOT DETECTED Final    Comment: Enterobacteriaceae represent a large family of gram-negative bacteria, not a single organism. CRITICAL RESULT CALLED TO, READ BACK BY AND VERIFIED WITH: S CHRISTY PHARMD 2025 01/04/19 A BROWNING    Enterobacter cloacae complex NOT DETECTED NOT DETECTED Final   Escherichia coli DETECTED (A) NOT DETECTED Final    Comment: CRITICAL RESULT CALLED TO, READ BACK BY AND VERIFIED WITH: S CHRISTY PHARMD 2025 01/04/19 A BROWNING    Klebsiella oxytoca NOT DETECTED NOT DETECTED Final   Klebsiella pneumoniae NOT DETECTED NOT DETECTED Final   Proteus species NOT DETECTED NOT DETECTED Final   Serratia marcescens NOT DETECTED NOT DETECTED Final   Carbapenem resistance NOT DETECTED NOT DETECTED Final   Haemophilus influenzae NOT DETECTED NOT DETECTED Final   Neisseria meningitidis NOT DETECTED NOT DETECTED Final   Pseudomonas aeruginosa NOT DETECTED NOT DETECTED Final   Candida albicans NOT DETECTED NOT DETECTED Final   Candida glabrata NOT DETECTED NOT DETECTED Final   Candida krusei NOT DETECTED NOT DETECTED Final   Candida parapsilosis NOT DETECTED NOT DETECTED Final   Candida tropicalis NOT DETECTED NOT DETECTED Final    Comment: Performed at [facility], [place]. [address] [place], [place] [address]  Culture, Urine     Status: Abnormal   Collection Time:  01/03/19  5:53 PM  Result Value Ref Range Status   Specimen Description   Final    CYSTOSCOPY RIGHT KIDNEY Performed at [facility] [address], [place], [place] [address]    Special Requests   Final    NONE Performed at [facility], [place] [address], [place], [place] [address]    Culture >=100,000 COLONIES/mL ESCHERICHIA COLI (A)  Final   Report Status 01/06/2019 FINAL  Final   Organism ID, Bacteria ESCHERICHIA COLI (A)  Final      Susceptibility   Escherichia coli - MIC*    AMPICILLIN 16 INTERMEDIATE Intermediate     CEFAZOLIN <=4 SENSITIVE Sensitive     CEFEPIME <=1 SENSITIVE Sensitive     CEFTAZIDIME <=1 SENSITIVE Sensitive     CEFTRIAXONE <=1 SENSITIVE Sensitive     CIPROFLOXACIN >=4 RESISTANT Resistant     GENTAMICIN <=1 SENSITIVE Sensitive     IMIPENEM <=0.25 SENSITIVE Sensitive     TRIMETH/SULFA <=20 SENSITIVE Sensitive     AMPICILLIN/SULBACTAM 4 SENSITIVE Sensitive     PIP/TAZO <=4 SENSITIVE Sensitive     Extended ESBL NEGATIVE Sensitive     * >=100,000 COLONIES/mL ESCHERICHIA COLI         Radiology Studies: Dg Chest 1 View  Result Date: 01/06/2019 CLINICAL DATA:  Fever and shortness of breath on exertion. EXAM: CHEST  1 VIEW COMPARISON:  PA and lateral chest 01/03/2019 and 07/28/2017. CT abdomen and pelvis 01/03/2019. FINDINGS: The lungs are clear clear. Prominent epicardial fat at the left cardiophrenic angle noted, unchanged. No pneumothorax or pleural effusion. Atherosclerosis noted. Heart size is upper normal. IMPRESSION: No acute disease. Electronically Signed   By: Inge Rise M.D.   On: 01/06/2019 15:25   US Renal  Result Date: 01/07/2019 CLINICAL DATA:  Right flank pain.  Right ureteral stent EXAM: RENAL / URINARY TRACT ULTRASOUND COMPLETE COMPARISON:  Abdomen pelvis CT dated 01/03/2019. FINDINGS: Right Kidney: Renal measurements: 13.5 x 6.7 x 6.0 cm = volume: 283  mL . Echogenicity within normal limits. No mass  or hydronephrosis visualized. Extensive perinephric fluid. Left Kidney: Renal measurements: 13.6 x 6.5 x 6.1 cm = volume: 285 mL. Echogenicity within normal limits. 5.5 cm upper pole cyst. No mass or hydronephrosis visualized. Bladder: Not distended with a Foley catheter in place. IMPRESSION: 1. Extensive right perinephric fluid with no visible residual hydronephrosis. 2. 5.5 cm simple left renal cyst. Electronically Signed   By: Claudie Revering M.D.   On: 01/07/2019 12:43        Scheduled Meds: . amitriptyline  25 mg Oral QHS  . apixaban  5 mg Oral BID  . carvedilol  3.125 mg Oral BID WC  . diltiazem  120 mg Oral q morning - 10a  . ferrous sulfate  325 mg Oral BID  . flecainide  75 mg Oral BID  . pantoprazole  40 mg Oral Daily  . polyethylene glycol  17 g Oral Daily  . potassium chloride  40 mEq Oral Once  . saccharomyces boulardii  250 mg Oral BID  . senna-docusate  2 tablet Oral BID   Continuous Infusions: . cefTRIAXone (ROCEPHIN)  IV 2 g (01/07/19 1410)  . lactated ringers 75 mL/hr at 01/07/19 2301  . vancomycin       LOS: 5 days      Georgette Shell, MD [facility]  If 7PM-7AM, please contact night-coverage [email or web address] Password [email or web address] 01/08/2019, 9:10 AM

## 2019-01-08 NOTE — Progress Notes (Addendum)
[place] for IV heparin (on Apixaban PTA) Indication: atrial fibrillation  Allergies  Allergen Reactions  . Codeine Nausea Only and Other (See Comments)    hallucinations  . Hydrocodone Nausea Only and Other (See Comments)  . Macrodantin [Nitrofurantoin] Nausea And Vomiting    Patient Measurements: Height: 5\' 7"  (170.2 cm) Weight: 198 lb 3.2 oz (89.9 kg) IBW/kg (Calculated) : 61.6 Heparin Dosing Weight: 80.9 kg  Vital Signs: Temp: 98.3 F (36.8 C) (03/14 0614) Temp Source: Oral (03/14 0614) BP: 138/72 (03/14 0614) Pulse Rate: 81 (03/14 0614)  Labs: Recent Labs    01/06/19 0426 01/07/19 1136 01/08/19 0631  HGB 13.1 12.6 12.7  HCT 43.3 41.7 41.7  PLT 431* 430* 450*  CREATININE 0.79 0.65 0.74    Estimated Creatinine Clearance: 68.9 mL/min (by C-G formula based on SCr of 0.74 mg/dL).   Medical History: Past Medical History:  Diagnosis Date  . Abdominal aortic atherosclerosis ([place]) 05/07/2016   Noted on CT abd/pelvis  . Anticoagulant long-term use    ELIQUIS  . Atrial fibrillation with RVR ([place])   . Chronic diastolic (congestive) heart failure ([place])   . Coronary artery disease   . Cystocele, midline    followed [facility] dr c. Marvel [facility] [place])  pt uses pessary  . Diverticulosis 05/07/2016   Noted on CT abd/pelvis  . Dyspnea    w exertion d/t Afib per pt  . First degree heart block   . GERD (gastroesophageal reflux disease)   . Hematuria    intermittently d/t JJ stent  . Hiatal hernia   . History of arteriovenostomy for renal dialysis ([place])   . History of colon polyps   . History of gastric polyp   . History of kidney stones   . History of peptic ulcer disease   . History of septic shock    2011 (pt unaware) and 06-04-2013  w/ acute renal failure  . History of uterine cancer    STAGE I  --  S/P TAH W/ BSO  (NO OTHER TX)  . History of ventilator dependency ([place]) 2014  .  Hydronephrosis, right 05/07/2016   Moderate, Noted on CT abd/pelvis  . Hyperlipidemia   . Hypertension   . Iron deficiency anemia hematology/ oncology--- dr Bobby Rumpf at [facility] in [place]--- per lov note 08-07-2017  stabilized and felt to be more chronic anemia   01/ 2018  dx iron def. anemia  s/p  IV Iron infusion and taking oral iron supplement  . PAF (paroxysmal atrial fibrillation) ([place]) DX JUNE 2012   CARDIOLOGIST--  DR Agustin Cree ([facility] [place]--- that office has changed to [facility]))  CHADS2  . Presence of pessary    for midline pessary  . Ureteral stricture, right    has JJ stent  . Wears glasses     Assessment: 35 y/oF presented [facility] ED on 01/03/2019 with severe right flank pain. PMH significant for a-fib (on Apixaban PTA), dCHF, recurrent ureteral obstruction s/p stent replacement every 5 months. Admitted with hydronephrosis with obstructed right stent, sepsis due to complicated UTI with suspected pyelonephritis. Patient underwent cystoscopy with right stent exchange on 01/03/2019. Apixaban resumed post-operatively. Extensive abdominal cellulitis noted 01/07/19. Ultrasound showed extensive right perinephric fluid collection. CT today shows increasing right perinephric inflammation and fluid as well as new inflammation in the right subcutaneous fat overlying the right renal region, and may represent infection. Apixaban held in case procedure needed. Pharmacy consulted  to dose IV heparin while Apixaban on hold.  Today, 01/08/19:  Last dose of Apixaban 5mg  PO BID this morning at 0925  SCr 0.74, CrCl ~ 69 ml/min  CBC: Hgb WNL at 12.7, Pltc elevated at 450K  Baseline heparin level (drawn 3/9) > 2.2, falsely elevated due to recent Apixaban use  Baseline aPTT (drawn 3/9) = 35 seconds  As Apixaban can falsely elevate heparin levels, will initially monitor and titrate IV heparin infusion via aPTT until effects of Apixaban have worn off and  heparin level and aPTT values correlate  Goal of Therapy:  Heparin level 0.3-0.7 units/ml aPTT 66-102 seconds Monitor platelets by anticoagulation protocol: Yes   Plan:   At 2130 tonight (12 hours after last Apixaban dose), begin IV heparin infusion at 1200 units/hr (no initial bolus)  aPTT and heparin level 8 hours after heparin infusion initiated  Daily CBC, heparin level, aPTT   Monitor closely for s/sx of bleeding   Lindell Spar, PharmD, BCPS Pager: [phone] 01/08/2019 1:12 PM

## 2019-01-09 ENCOUNTER — Inpatient Hospital Stay (HOSPITAL_COMMUNITY): Payer: Medicare Other

## 2019-01-09 LAB — CBC
HCT: 41.9 % (ref 36.0–46.0)
Hemoglobin: 12.8 g/dL (ref 12.0–15.0)
MCH: 27.4 pg (ref 26.0–34.0)
MCHC: 30.5 g/dL (ref 30.0–36.0)
MCV: 89.7 fL (ref 80.0–100.0)
Platelets: 474 10*3/uL — ABNORMAL HIGH (ref 150–400)
RBC: 4.67 MIL/uL (ref 3.87–5.11)
RDW: 15.3 % (ref 11.5–15.5)
WBC: 16.4 10*3/uL — ABNORMAL HIGH (ref 4.0–10.5)
nRBC: 0 % (ref 0.0–0.2)

## 2019-01-09 LAB — BASIC METABOLIC PANEL
Anion gap: 11 (ref 5–15)
BUN: 12 mg/dL (ref 8–23)
CO2: 27 mmol/L (ref 22–32)
Calcium: 8 mg/dL — ABNORMAL LOW (ref 8.9–10.3)
Chloride: 99 mmol/L (ref 98–111)
Creatinine, Ser: 0.63 mg/dL (ref 0.44–1.00)
GFR calc Af Amer: >60 mL/min (ref >60)
GFR calc non Af Amer: >60 mL/min (ref >60)
Glucose, Bld: 83 mg/dL (ref 70–99)
Potassium: 3.4 mmol/L — ABNORMAL LOW (ref 3.5–5.1)
Sodium: 137 mmol/L (ref 135–145)

## 2019-01-09 LAB — HEPARIN LEVEL (UNFRACTIONATED)
Heparin Unfractionated: 1.7 IU/mL — ABNORMAL HIGH (ref 0.30–0.70)
Heparin Unfractionated: 1.07 IU/mL — ABNORMAL HIGH (ref 0.30–0.70)

## 2019-01-09 LAB — APTT
aPTT: 35 seconds (ref 24–36)
aPTT: 48 seconds — ABNORMAL HIGH (ref 24–36)

## 2019-01-09 MED ORDER — PROMETHAZINE HCL 25 MG/ML IJ SOLN
12.5000 mg | Freq: Four times a day (QID) | INTRAMUSCULAR | Status: DC | PRN
  Administered 2019-01-09 – 2019-01-11 (×5): 12.5 mg via INTRAVENOUS
  Filled 2019-01-09 (×6): qty 1

## 2019-01-09 MED ORDER — HEPARIN (PORCINE) 25000 UT/250ML-% IV SOLN
1400.0000 [IU]/h | INTRAVENOUS | Status: DC
  Administered 2019-01-09: 1400 [IU]/h via INTRAVENOUS
  Filled 2019-01-09: qty 250

## 2019-01-09 MED ORDER — HEPARIN (PORCINE) 25000 UT/250ML-% IV SOLN
1600.0000 [IU]/h | INTRAVENOUS | Status: DC
  Administered 2019-01-10: 1600 [IU]/h via INTRAVENOUS
  Filled 2019-01-09: qty 250

## 2019-01-09 NOTE — Progress Notes (Signed)
[facility] for IV heparin (on Apixaban PTA) Indication: atrial fibrillation  Allergies  Allergen Reactions  . Codeine Nausea Only and Other (See Comments)    hallucinations  . Hydrocodone Nausea Only and Other (See Comments)  . Macrodantin [Nitrofurantoin] Nausea And Vomiting    Patient Measurements: Height: 5\' 7"  (170.2 cm) Weight: 198 lb 3.2 oz (89.9 kg) IBW/kg (Calculated) : 61.6 Heparin Dosing Weight: 80.9 kg  Vital Signs: Temp: 98.8 F (37.1 C) (03/15 0611) Temp Source: Oral (03/15 0611) BP: 139/66 (03/15 0611) Pulse Rate: 89 (03/15 0611)  Labs: Recent Labs    01/07/19 1136 01/08/19 0631 01/09/19 0554  HGB 12.6 12.7 12.8  HCT 41.7 41.7 41.9  PLT 430* 450* 474*  APTT  --   --  35  HEPARINUNFRC  --   --  1.70*  CREATININE 0.65 0.74  --     Estimated Creatinine Clearance: 68.9 mL/min (by C-G formula based on SCr of 0.74 mg/dL).   Medical History: Past Medical History:  Diagnosis Date  . Abdominal aortic atherosclerosis ([place]) 05/07/2016   Noted on CT abd/pelvis  . Anticoagulant long-term use    ELIQUIS  . Atrial fibrillation with RVR ([place])   . Chronic diastolic (congestive) heart failure ([place])   . Coronary artery disease   . Cystocele, midline    followed [facility] dr c. Marvel Plan ([facility] [place])  pt uses pessary  . Diverticulosis 05/07/2016   Noted on CT abd/pelvis  . Dyspnea    w exertion d/t Afib per pt  . First degree heart block   . GERD (gastroesophageal reflux disease)   . Hematuria    intermittently d/t JJ stent  . Hiatal hernia   . History of arteriovenostomy for renal dialysis ([place])   . History of colon polyps   . History of gastric polyp   . History of kidney stones   . History of peptic ulcer disease   . History of septic shock    2011 (pt unaware) and 06-04-2013  w/ acute renal failure  . History of uterine cancer    STAGE I  --  S/P TAH W/ BSO  (NO OTHER TX)  . History  of ventilator dependency ([place]) 2014  . Hydronephrosis, right 05/07/2016   Moderate, Noted on CT abd/pelvis  . Hyperlipidemia   . Hypertension   . Iron deficiency anemia hematology/ oncology--- dr Bobby Rumpf at [facility] in [place]--- per lov note 08-07-2017  stabilized and felt to be more chronic anemia   01/ 2018  dx iron def. anemia  s/p  IV Iron infusion and taking oral iron supplement  . PAF (paroxysmal atrial fibrillation) ([place]) DX JUNE 2012   CARDIOLOGIST--  DR Agustin Cree ([facility] , [place]--- that office has changed to [facility]))  CHADS2  . Presence of pessary    for midline pessary  . Ureteral stricture, right    has JJ stent  . Wears glasses     Assessment: 40 y/oF presented to [facility] ED on 01/03/2019 with severe right flank pain. PMH significant for a-fib (on Apixaban PTA), dCHF, recurrent ureteral obstruction s/p stent replacement every 5 months. Admitted with hydronephrosis with obstructed right stent, sepsis due to complicated UTI with suspected pyelonephritis. Patient underwent cystoscopy with right stent exchange on 01/03/2019. Apixaban resumed post-operatively. Extensive abdominal cellulitis noted 01/07/19. Ultrasound showed extensive right perinephric fluid collection. CT today shows increasing right perinephric inflammation and fluid as well as new inflammation in the  right subcutaneous fat overlying the right renal region, and may represent infection. Apixaban held in case procedure needed. Pharmacy consulted to dose IV heparin while Apixaban on hold.  Today, 01/09/19:  Last dose of Apixaban 5mg  PO BID   0925 on 3/14  SCr 0.74 ( 3/15), CrCl ~ 69 ml/min  CBC: Hgb WNL at 12.8, Pltc elevated at 474K  Heparin level is 1.70, falsely elevated due to recent Apixaban use  aPTT = 35 seconds  As Apixaban can falsely elevate heparin levels, will monitor and titrate IV heparin infusion via aPTT until effects of Apixaban have worn off and heparin  level and aPTT values correlate  Goal of Therapy:  Heparin level 0.3-0.7 units/ml aPTT 66-102 seconds Monitor platelets by anticoagulation protocol: Yes   Plan:   Increase  IV heparin infusion to 1400 units/hr   aPTT and heparin level 8 hours after heparin infusion rate changed  Daily CBC, heparin level, aPTT   Monitor closely for s/sx of bleeding   Royetta Asal, PharmD, BCPS Pager [phone] 01/09/2019 8:27 AM

## 2019-01-09 NOTE — Progress Notes (Addendum)
PROGRESS NOTE    [name] [name]  AJG:[id number] DOB: [birth year] DOA: 01/03/2019 PCP: [name], MD  Brief Narrative:78 y.o.femalewith medical history significant forparoxysmal A. fib on Eliquis, chronic diastolic CHF, recurrent ureteral obstruction post ureteral stent placement every 5 months, hyperlipidemia, GERD who presented to [facility] ED with complaints of sudden onset severe right flank pain that started earlier this morning around 4 AM. Associated with chills. Last visit with her urologist was 2-3 weeks ago. She was thentreated with Bactrim and completed her course of antibiotic for UTI.No other changes to her medications.  Inthe ED, vital signsareremarkable for tachycardia with rate of 114,WBC 17 K and urine analysis positive for pyuria. CT abdomen pelvis with contrast done on 01/03/2019 reveals moderate to moderately severe right hydronephrosis with a right double-J ureteral stent in good position and extensive stranding about the right kidney and proximal ureter worse than on the prior exam and worrisome for urinary tract infection/urethritis. ED physician consulted urology. TRH asked to admit  Taken to the OR by urology on 01/03/19 for replacement of her R ureter stent   Assessment & Plan:   Active Problems:   Hydronephrosis   Urinary tract infection without hematuria   Occlusion of ureteral stent (HCC)  Moderately severe right hydronephrosis withobstructed right stent Findings on CT abdomen and pelvis with contrast post right stent replacement on 01/03/2019 by Dr.Wrenn. Urine culture and blood culture with E. coli sensitive to Rocephin. Meropenem DC'd Rocephin started.  Patient now with right flank cellulitis on vancomycin and Rocephin with no significant improvement.  Ultrasound showed perinephric fluid collection extensively.  CT of the abdomen and pelvis showed increasing right perinephric inflammation and fluid as well as new inflammation in the right  subcutaneous fat overlying the right renal region.  New small right pleural effusion increasing bibasilar atelectasis.  Right ureteral stent with decreased right hydronephrosis.  Gas in the renal collecting system and bladder may be from recent procedure.  Patient continues with leukocytosis and now again with fever.  Dr. Lorie Apley note reviewed.  Consulted IR for perinephric drain tomorrow.  Continue IV antibiotics.  Encourage to use incentive spirometry.  AKI, suspect post obstructive has been resolved.  Patient was on Lasix at home which is on hold at this time.  History of recurrent ureteral obstruction Has ureteral stent in place Follows with urology outpatient Dr. Tresa Moore Self-reported ureteral stentarereplaced every 5 months Urology following discussed with Dr. Tresa Moore  Paroxysmal A. Fib Rate controlled with Coreg and diltiazem Continue Coreg 6.25 mg twice daily, diltiazem 120 mg daily.  Currently on heparin drip as a bridge since Eliquis has to be on hold for the procedure tomorrow.  GERD Continue omeprazole  Chronic diastolic CHFpatient is on 2 L of oxygen. She reports she does not have oxygen at home. Her ambulatory oxygen saturation dropped so she was placed on 2 L of oxygen. Chest x-ray shows clear lungs. Encourage her to use incentive spirometry as much as possible.  DVT prophylaxis:Heparin as patient going for procedure Eliquis was stopped  Code Status:Full code as confirmed by patient herself.  Family Communication:Discussed with husband  Disposition Plan:Pending clinical improvement  Estimated body mass index is 31.04 kg/m as calculated from the following:   Height as of this encounter: 5\' 7"  (1.702 m).   Weight as of this encounter: 89.9 kg.  Subjective: In bed has been by the bedside complaining of right flank pain  Objective: Vitals:   01/08/19 3559 01/08/19 1543 01/08/19 2124 01/09/19 7416  BP: 138/72 (!) 123/58 138/65 139/66  Pulse: 81 82 86 89   Resp: 14 (!) 22 20 20   Temp: 98.3 F (36.8 C) 99.6 F (37.6 C) (!) 100.6 F (38.1 C) 98.8 F (37.1 C)  TempSrc: Oral Oral Oral Oral  SpO2: 95% 96% 94% 95%  Weight:      Height:        Intake/Output Summary (Last 24 hours) at 01/09/2019 1209 Last data filed at 01/09/2019 4163 Gross per 24 hour  Intake 931.76 ml  Output 902 ml  Net 29.76 ml   Filed Weights   01/06/19 0500 01/07/19 0636 01/08/19 0500  Weight: 89.3 kg 90.6 kg 89.9 kg    Examination:  General exam: Appears calm and comfortable  Respiratory system: Clear to auscultation. Respiratory effort normal. Cardiovascular system: S1 & S2 heard, RRR. No JVD, murmurs, rubs, gallops or clicks. No pedal edema. Gastrointestinal system: Abdomen is nondistended, soft and nontender. No organomegaly or masses felt. Normal bowel sounds heard. Central nervous system: Alert and oriented. No focal neurological deficits. Extremities: Symmetric 5 x 5 power. Skin: Right flank edema erythema Psychiatry: Judgement and insight appear normal. Mood & affect appropriate.     Data Reviewed: I have personally reviewed following labs and imaging studies  CBC: Recent Labs  Lab 01/05/19 0848 01/06/19 0426 01/07/19 1136 01/08/19 0631 01/09/19 0554  WBC 14.7* 16.1* 16.9* 16.4* 16.4*  HGB 13.6 13.1 12.6 12.7 12.8  HCT 44.3 43.3 41.7 41.7 41.9  MCV 89.3 89.1 88.7 90.1 89.7  PLT 444* 431* 430* 450* 845*   Basic Metabolic Panel: Recent Labs  Lab 01/05/19 0848 01/06/19 0426 01/07/19 1136 01/08/19 0631 01/09/19 0554  NA 136 137 134* 136 137  K 3.9 4.0 3.4* 3.1* 3.4*  CL 102 103 100 97* 99  CO2 23 24 26 29 27   GLUCOSE 122* 99 104* 84 83  BUN 33* 26* 17 16 12   CREATININE 1.11* 0.79 0.65 0.74 0.63  CALCIUM 8.5* 8.4* 8.0* 8.0* 8.0*   GFR: Estimated Creatinine Clearance: 68.9 mL/min (by C-G formula based on SCr of 0.63 mg/dL). Liver Function Tests: Recent Labs  Lab 01/03/19 1050 01/07/19 1136  AST 23 19  ALT 13 13  ALKPHOS 79  59  BILITOT 1.2 0.9  PROT 7.5 5.6*  ALBUMIN 3.7 2.1*   Recent Labs  Lab 01/03/19 1048  LIPASE 29   No results for input(s): AMMONIA in the last 168 hours. Coagulation Profile: No results for input(s): INR, PROTIME in the last 168 hours. Cardiac Enzymes: Recent Labs  Lab 01/03/19 1050  TROPONINI <0.03   BNP (last 3 results) No results for input(s): PROBNP in the last 8760 hours. HbA1C: No results for input(s): HGBA1C in the last 72 hours. CBG: No results for input(s): GLUCAP in the last 168 hours. Lipid Profile: No results for input(s): CHOL, HDL, LDLCALC, TRIG, CHOLHDL, LDLDIRECT in the last 72 hours. Thyroid Function Tests: No results for input(s): TSH, T4TOTAL, FREET4, T3FREE, THYROIDAB in the last 72 hours. Anemia Panel: No results for input(s): VITAMINB12, FOLATE, FERRITIN, TIBC, IRON, RETICCTPCT in the last 72 hours. Sepsis Labs: No results for input(s): PROCALCITON, LATICACIDVEN in the last 168 hours.  Recent Results (from the past 240 hour(s))  Urine Culture     Status: Abnormal   Collection Time: 01/03/19 10:50 AM  Result Value Ref Range Status   Specimen Description   Final    URINE, CLEAN CATCH Performed at [facility], [place] [address] [place], [place] [address]  Special Requests   Final    NONE Performed at [facility], [place] [address], [place], [place] [address]    Culture >=100,000 COLONIES/mL ESCHERICHIA COLI (A)  Final   Report Status 01/06/2019 FINAL  Final   Organism ID, Bacteria ESCHERICHIA COLI (A)  Final      Susceptibility   Escherichia coli - MIC*    AMPICILLIN 16 INTERMEDIATE Intermediate     CEFAZOLIN <=4 SENSITIVE Sensitive     CEFTRIAXONE <=1 SENSITIVE Sensitive     CIPROFLOXACIN >=4 RESISTANT Resistant     GENTAMICIN 4 SENSITIVE Sensitive     IMIPENEM 0.5 SENSITIVE Sensitive     NITROFURANTOIN <=16 SENSITIVE Sensitive     TRIMETH/SULFA <=20 SENSITIVE Sensitive     AMPICILLIN/SULBACTAM 4  SENSITIVE Sensitive     PIP/TAZO 8 SENSITIVE Sensitive     Extended ESBL NEGATIVE Sensitive     * >=100,000 COLONIES/mL ESCHERICHIA COLI  Culture, blood (routine x 2)     Status: Abnormal   Collection Time: 01/03/19  3:44 PM  Result Value Ref Range Status   Specimen Description   Final    BLOOD LEFT ARM Performed at [facility] [address], [place], [place] [address]    Special Requests   Final    BOTTLES DRAWN AEROBIC ONLY Blood Culture adequate volume Performed at [facility] [address], [place], [place] [address]    Culture  Setup Time   Final    GRAM NEGATIVE RODS AEROBIC BOTTLE ONLY CRITICAL RESULT CALLED TO, READ BACK BY AND VERIFIED WITH: S CHRISTY PHARMD 2025 01/04/19 A BROWNING Performed at [facility], [place] [address], [place], [place] [address]    Culture ESCHERICHIA COLI (A)  Final   Report Status 01/06/2019 FINAL  Final   Organism ID, Bacteria ESCHERICHIA COLI  Final      Susceptibility   Escherichia coli - MIC*    AMPICILLIN <=2 SENSITIVE Sensitive     CEFAZOLIN <=4 SENSITIVE Sensitive     CEFEPIME <=1 SENSITIVE Sensitive     CEFTAZIDIME <=1 SENSITIVE Sensitive     CEFTRIAXONE <=1 SENSITIVE Sensitive     CIPROFLOXACIN >=4 RESISTANT Resistant     GENTAMICIN 4 SENSITIVE Sensitive     IMIPENEM <=0.25 SENSITIVE Sensitive     TRIMETH/SULFA <=20 SENSITIVE Sensitive     AMPICILLIN/SULBACTAM <=2 SENSITIVE Sensitive     PIP/TAZO <=4 SENSITIVE Sensitive     Extended ESBL NEGATIVE Sensitive     * ESCHERICHIA COLI  Culture, blood (routine x 2)     Status: None   Collection Time: 01/03/19  3:44 PM  Result Value Ref Range Status   Specimen Description   Final    BLOOD RIGHT HAND Performed at [facility] [address] [place], [place] [address]    Special Requests   Final    BOTTLES DRAWN AEROBIC ONLY Blood Culture adequate volume Performed at [facility] [address], [place], [place] [address]    Culture   Final    NO GROWTH 5 DAYS Performed at [facility], [place] [address] [place],  [address]    Report Status 01/08/2019 FINAL  Final  Blood Culture ID Panel (Reflexed)     Status: Abnormal   Collection Time: 01/03/19  3:44 PM  Result Value Ref Range Status   Enterococcus species NOT DETECTED NOT DETECTED Final   Listeria monocytogenes NOT DETECTED NOT DETECTED Final  Staphylococcus species NOT DETECTED NOT DETECTED Final   Staphylococcus aureus (BCID) NOT DETECTED NOT DETECTED Final   Streptococcus species NOT DETECTED NOT DETECTED Final   Streptococcus agalactiae NOT DETECTED NOT DETECTED Final   Streptococcus pneumoniae NOT DETECTED NOT DETECTED Final   Streptococcus pyogenes NOT DETECTED NOT DETECTED Final   Acinetobacter baumannii NOT DETECTED NOT DETECTED Final   Enterobacteriaceae species DETECTED (A) NOT DETECTED Final    Comment: Enterobacteriaceae represent a large family of gram-negative bacteria, not a single organism. CRITICAL RESULT CALLED TO, READ BACK BY AND VERIFIED WITH: S CHRISTY PHARMD 2025 01/04/19 A BROWNING    Enterobacter cloacae complex NOT DETECTED NOT DETECTED Final   Escherichia coli DETECTED (A) NOT DETECTED Final    Comment: CRITICAL RESULT CALLED TO, READ BACK BY AND VERIFIED WITH: S CHRISTY PHARMD 2025 01/04/19 A BROWNING    Klebsiella oxytoca NOT DETECTED NOT DETECTED Final   Klebsiella pneumoniae NOT DETECTED NOT DETECTED Final   Proteus species NOT DETECTED NOT DETECTED Final   Serratia marcescens NOT DETECTED NOT DETECTED Final   Carbapenem resistance NOT DETECTED NOT DETECTED Final   Haemophilus influenzae NOT DETECTED NOT DETECTED Final   Neisseria meningitidis NOT DETECTED NOT DETECTED Final   Pseudomonas aeruginosa NOT DETECTED NOT DETECTED Final   Candida albicans NOT DETECTED NOT DETECTED Final   Candida glabrata NOT DETECTED NOT DETECTED Final   Candida krusei NOT DETECTED NOT DETECTED  Final   Candida parapsilosis NOT DETECTED NOT DETECTED Final   Candida tropicalis NOT DETECTED NOT DETECTED Final    Comment: Performed at [facility], [place] [address], [place], [place] [address]  Culture, Urine     Status: Abnormal   Collection Time: 01/03/19  5:53 PM  Result Value Ref Range Status   Specimen Description   Final    CYSTOSCOPY RIGHT KIDNEY Performed at [facility] [address], [place], [place] [address]    Special Requests   Final    NONE Performed at [facility], [place] [address], [place], [place] [address]    Culture >=100,000 COLONIES/mL ESCHERICHIA COLI (A)  Final   Report Status 01/06/2019 FINAL  Final   Organism ID, Bacteria ESCHERICHIA COLI (A)  Final      Susceptibility   Escherichia coli - MIC*    AMPICILLIN 16 INTERMEDIATE Intermediate     CEFAZOLIN <=4 SENSITIVE Sensitive     CEFEPIME <=1 SENSITIVE Sensitive     CEFTAZIDIME <=1 SENSITIVE Sensitive     CEFTRIAXONE <=1 SENSITIVE Sensitive     CIPROFLOXACIN >=4 RESISTANT Resistant     GENTAMICIN <=1 SENSITIVE Sensitive     IMIPENEM <=0.25 SENSITIVE Sensitive     TRIMETH/SULFA <=20 SENSITIVE Sensitive     AMPICILLIN/SULBACTAM 4 SENSITIVE Sensitive     PIP/TAZO <=4 SENSITIVE Sensitive     Extended ESBL NEGATIVE Sensitive     * >=100,000 COLONIES/mL ESCHERICHIA COLI         Radiology Studies: Ct Abdomen Pelvis W Contrast  Result Date: 01/08/2019 CLINICAL DATA:  78 year old female with acute RIGHT flank pain following stent placement on 01/03/2019. RIGHT flank erythema. EXAM: CT ABDOMEN AND PELVIS WITH CONTRAST TECHNIQUE: Multidetector CT imaging of the abdomen and pelvis was performed using the standard protocol following bolus administration of intravenous contrast. CONTRAST:  14mL OMNIPAQUE IOHEXOL 300 MG/ML  SOLN COMPARISON:  01/07/2019 ultrasound and 01/03/2019 CT FINDINGS: Lower chest: New small RIGHT pleural effusion noted with increasing bibasilar  atelectasis. Cardiomegaly again noted. Hepatobiliary: No  hepatic abnormalities. The patient is status post cholecystectomy. No biliary dilatation. Pancreas: Unremarkable Spleen: Unremarkable Adrenals/Urinary Tract: A RIGHT ureteral stent is noted with tip and posterior UPPER RIGHT kidney and bladder. Hydronephrosis has slightly decreased. Gas in the renal collecting system may be related from recent procedure. Increasing RIGHT perinephric inflammation/fluid identified with new inflammation in the RIGHT subcutaneous fat overlying the RIGHT kidney. Bilateral renal cortical atrophy noted with unchanged LEFT renal cyst. Gas in the bladder is likely from recent procedure. The adrenal glands are unremarkable. Stomach/Bowel: Stomach is within normal limits. No evidence of bowel wall thickening, distention, or inflammatory changes. Vascular/Lymphatic: Aortic atherosclerosis. No enlarged abdominal or pelvic lymph nodes. Reproductive: Status post hysterectomy. No adnexal masses. Pessary again noted Other: No ascites or pneumoperitoneum. Musculoskeletal: No acute abnormality. L2 compression fracture, L3-L5 lumbar fusion changes and RIGHT total hip arthroplasty changes again noted. IMPRESSION: 1. Increasing RIGHT perinephric inflammation and fluid as well as new inflammation in the RIGHT subcutaneous fat overlying the RIGHT renal region, and may represent infection. 2. New small RIGHT pleural effusion and increasing bibasilar atelectasis. 3. RIGHT ureteral stent with decreased RIGHT hydronephrosis. Gas in the renal collecting system and bladder may be from recent procedure versus infection. Electronically Signed   By: Margarette Canada M.D.   On: 01/08/2019 11:13   US Renal  Result Date: 01/07/2019 CLINICAL DATA:  Right flank pain.  Right ureteral stent EXAM: RENAL / URINARY TRACT ULTRASOUND COMPLETE COMPARISON:  Abdomen pelvis CT dated 01/03/2019. FINDINGS: Right Kidney: Renal measurements: 13.5 x 6.7 x 6.0 cm = volume: 283 mL .  Echogenicity within normal limits. No mass or hydronephrosis visualized. Extensive perinephric fluid. Left Kidney: Renal measurements: 13.6 x 6.5 x 6.1 cm = volume: 285 mL. Echogenicity within normal limits. 5.5 cm upper pole cyst. No mass or hydronephrosis visualized. Bladder: Not distended with a Foley catheter in place. IMPRESSION: 1. Extensive right perinephric fluid with no visible residual hydronephrosis. 2. 5.5 cm simple left renal cyst. Electronically Signed   By: Claudie Revering M.D.   On: 01/07/2019 12:43        Scheduled Meds:  amitriptyline  25 mg Oral QHS   carvedilol  3.125 mg Oral BID WC   diltiazem  120 mg Oral q morning - 10a   ferrous sulfate  325 mg Oral BID   flecainide  75 mg Oral BID   pantoprazole  40 mg Oral Daily   polyethylene glycol  17 g Oral Daily   saccharomyces boulardii  250 mg Oral BID   senna-docusate  2 tablet Oral BID   Continuous Infusions:  cefTRIAXone (ROCEPHIN)  IV 2 g (01/08/19 1322)   heparin 1,400 Units/hr (01/09/19 0915)   lactated ringers 75 mL/hr at 01/09/19 0540   vancomycin 1,250 mg (01/08/19 1455)     LOS: 6 days     Georgette Shell, MD [facility]  If 7PM-7AM, please contact night-coverage [email or web address] Password [facility] 01/09/2019, 12:09 PM

## 2019-01-09 NOTE — Progress Notes (Signed)
Price for IV heparin (on Apixaban PTA) Indication: atrial fibrillation  Allergies  Allergen Reactions  . Codeine Nausea Only and Other (See Comments)    hallucinations  . Hydrocodone Nausea Only and Other (See Comments)  . Macrodantin [Nitrofurantoin] Nausea And Vomiting    Patient Measurements: Height: 5\' 7"  (170.2 cm) Weight: 198 lb 3.2 oz (89.9 kg) IBW/kg (Calculated) : 61.6 Heparin Dosing Weight: 80.9 kg  Vital Signs:    Labs: Recent Labs    01/07/19 1136 01/08/19 0631 01/09/19 0554 01/09/19 1659  HGB 12.6 12.7 12.8  --   HCT 41.7 41.7 41.9  --   PLT 430* 450* 474*  --   APTT  --   --  35 48*  HEPARINUNFRC  --   --  1.70* 1.07*  CREATININE 0.65 0.74 0.63  --     Estimated Creatinine Clearance: 68.9 mL/min (by C-G formula based on SCr of 0.63 mg/dL).   Medical History: Past Medical History:  Diagnosis Date  . Abdominal aortic atherosclerosis ([place]) 05/07/2016   Noted on CT abd/pelvis  . Anticoagulant long-term use    ELIQUIS  . Atrial fibrillation with RVR ([place])   . Chronic diastolic (congestive) heart failure ([place])   . Coronary artery disease   . Cystocele, midline    followed by dr c. Marvel Plan ([facility] [place])  pt uses pessary  . Diverticulosis 05/07/2016   Noted on CT abd/pelvis  . Dyspnea    w exertion d/t Afib per pt  . First degree heart block   . GERD (gastroesophageal reflux disease)   . Hematuria    intermittently d/t JJ stent  . Hiatal hernia   . History of arteriovenostomy for renal dialysis ([place])   . History of colon polyps   . History of gastric polyp   . History of kidney stones   . History of peptic ulcer disease   . History of septic shock    2011 (pt unaware) and 06-04-2013  w/ acute renal failure  . History of uterine cancer    STAGE I  --  S/P TAH W/ BSO  (NO OTHER TX)  . History of ventilator dependency ([place]) 2014  . Hydronephrosis, right 05/07/2016   Moderate, Noted on CT abd/pelvis  . Hyperlipidemia   . Hypertension   . Iron deficiency anemia hematology/ oncology--- dr Bobby Rumpf at [facility] in [place]--- per lov note 08-07-2017  stabilized and felt to be more chronic anemia   01/ 2018  dx iron def. anemia  s/p  IV Iron infusion and taking oral iron supplement  . PAF (paroxysmal atrial fibrillation) ([place]) DX JUNE 2012   CARDIOLOGIST--  DR Agustin Cree ([facility] , [place]--- that office has changed to [facility]))  CHADS2  . Presence of pessary    for midline pessary  . Ureteral stricture, right    has JJ stent  . Wears glasses     Assessment: 85 y/oF presented to [facility] ED on 01/03/2019 with severe right flank pain. PMH significant for a-fib (on Apixaban PTA), dCHF, recurrent ureteral obstruction s/p stent replacement every 5 months. Admitted with hydronephrosis with obstructed right stent, sepsis due to complicated UTI with suspected pyelonephritis. Patient underwent cystoscopy with right stent exchange on 01/03/2019. Apixaban resumed post-operatively. Extensive abdominal cellulitis noted 01/07/19. Ultrasound showed extensive right perinephric fluid collection. CT today shows increasing right perinephric inflammation and fluid as well as new inflammation in the right subcutaneous fat overlying the right  renal region, and may represent infection. Apixaban held in case procedure needed. Pharmacy consulted to dose IV heparin while Apixaban on hold.  Today, 01/09/19:  Last dose of Apixaban 5mg  PO BID   0925 on 3/14  SCr 0.74 ( 3/15), CrCl ~ 69 ml/min  CBC: Hgb WNL at 12.8, Pltc elevated at 474K  Heparin level is 1.70, falsely elevated due to recent Apixaban use  aPTT = 35 seconds  As Apixaban can falsely elevate heparin levels, will monitor and titrate IV heparin infusion via aPTT until effects of Apixaban have worn off and heparin level and aPTT values correlate  Goal of Therapy:  Heparin level 0.3-0.7  units/ml aPTT 66-102 seconds Monitor platelets by anticoagulation protocol: Yes   Plan:   Increase  IV heparin infusion to 1600 units/hr   aPTT and heparin level 8 hours after heparin infusion rate changed  Daily CBC, heparin level, aPTT   Monitor closely for s/sx of bleeding   Royetta Asal, PharmD, BCPS Pager [phone] 01/09/2019 6:20 PM

## 2019-01-09 NOTE — Progress Notes (Signed)
Subjective/Chief Complaint:    1 - Rt Chronic Hydronephrosis / Partial Ureteral Stricture- Multiple prior stone procedures on right including SWL, ureteroscopy, and neph tube.    Recent Course:  02/2018 - Exchange Rt 8x24 polaris stent; 08/2018 Exchange Rt 8x24 polaris stent 01/03/2019 - Exchange Rt 8x24 polaris stent as some hydro / stranding concerning for possible stent occlusion.    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 [address], [place] 01/03/19 - e.coli  Res Cipro, but sens others. Now on rocephin + VAnc.  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   Today Kiki is worsening in terms of infectious parameters. Some low grade fevers again and WBC not decreasing. Area of cellulitis / flank inflammation slightly increased.     Objective: Vital signs in last 24 hours: Temp:  [98.8 F (37.1 C)-100.6 F (38.1 C)] 98.8 F (37.1 C) (03/15 0611) Pulse Rate:  [82-89] 89 (03/15 0611) Resp:  [20-22] 20 (03/15 0611) BP: (123-139)/(58-66) 139/66 (03/15 0611) SpO2:  [94 %-96 %] 95 % (03/15 0611) Last BM Date: 01/08/19  Intake/Output from previous day: 03/14 0701 - 03/15 0700 In: 931.8 [I.V.:931.8] Out: 902 [Urine:601; Stool:301] Intake/Output this shift: No intake/output data recorded.   General appearance: alert, cooperative and at baseline, very pleasant Eyes: negative Nose: Nares normal. Septum midline. Mucosa normal. No drainage or sinus tenderness. Throat: lips, mucosa, and tongue normal; teeth and gums normal Neck: supple, symmetrical, trachea midline Back: symmetric, no curvature. ROM normal. Slightly increased Rt flank soft tissue erythema that blanches and edema compared to prior border. NO drainage / fluctuence / crepitus.  Resp: non-labored on room air.   Cardio: mild tachycardia.  Extremities: extremities normal, atraumatic, no cyanosis or edema Lymph nodes: Cervical, supraclavicular, and axillary nodes normal.  Lab Results:  Recent Labs    01/08/19 0631 01/09/19 0554  WBC 16.4* 16.4*  HGB 12.7 12.8  HCT 41.7 41.9  PLT 450* 474*   BMET Recent Labs    01/07/19 1136 01/08/19 0631  NA 134* 136  K 3.4* 3.1*  CL 100 97*  CO2 26 29  GLUCOSE 104* 84  BUN 17 16  CREATININE 0.65 0.74  CALCIUM 8.0* 8.0*   PT/INR No results for input(s): LABPROT, INR in the last 72 hours. ABG No results for input(s): PHART, HCO3 in the last 72 hours.  Invalid input(s): PCO2, PO2  Studies/Results: Ct Abdomen Pelvis W Contrast  Result Date: 01/08/2019 CLINICAL DATA:  78 year old female with acute RIGHT flank pain following stent placement on 01/03/2019. RIGHT flank erythema. EXAM: CT ABDOMEN AND PELVIS WITH CONTRAST TECHNIQUE: Multidetector CT imaging of the abdomen and pelvis was performed using the standard protocol following bolus administration of intravenous contrast. CONTRAST:  123mL OMNIPAQUE IOHEXOL 300 MG/ML  SOLN COMPARISON:  01/07/2019 ultrasound and 01/03/2019 CT FINDINGS: Lower chest: New small RIGHT pleural effusion noted with increasing bibasilar atelectasis. Cardiomegaly again noted. Hepatobiliary: No hepatic abnormalities. The patient is status post cholecystectomy. No biliary dilatation. Pancreas: Unremarkable Spleen: Unremarkable Adrenals/Urinary Tract: A RIGHT ureteral stent is noted with tip and posterior UPPER RIGHT kidney and bladder. Hydronephrosis has slightly decreased. Gas in the renal collecting system may be related from recent procedure. Increasing RIGHT perinephric inflammation/fluid identified with new inflammation in the RIGHT subcutaneous fat overlying the RIGHT kidney. Bilateral renal cortical atrophy noted with unchanged LEFT renal  cyst. Gas in the bladder is likely from recent procedure. The adrenal glands are  unremarkable. Stomach/Bowel: Stomach is within normal limits. No evidence of bowel wall thickening, distention, or inflammatory changes. Vascular/Lymphatic: Aortic atherosclerosis. No enlarged abdominal or pelvic lymph nodes. Reproductive: Status post hysterectomy. No adnexal masses. Pessary again noted Other: No ascites or pneumoperitoneum. Musculoskeletal: No acute abnormality. L2 compression fracture, L3-L5 lumbar fusion changes and RIGHT total hip arthroplasty changes again noted. IMPRESSION: 1. Increasing RIGHT perinephric inflammation and fluid as well as new inflammation in the RIGHT subcutaneous fat overlying the RIGHT renal region, and may represent infection. 2. New small RIGHT pleural effusion and increasing bibasilar atelectasis. 3. RIGHT ureteral stent with decreased RIGHT hydronephrosis. Gas in the renal collecting system and bladder may be from recent procedure versus infection. Electronically Signed   By: Margarette Canada M.D.   On: 01/08/2019 11:13   US Renal  Result Date: 01/07/2019 CLINICAL DATA:  Right flank pain.  Right ureteral stent EXAM: RENAL / URINARY TRACT ULTRASOUND COMPLETE COMPARISON:  Abdomen pelvis CT dated 01/03/2019. FINDINGS: Right Kidney: Renal measurements: 13.5 x 6.7 x 6.0 cm = volume: 283 mL . Echogenicity within normal limits. No mass or hydronephrosis visualized. Extensive perinephric fluid. Left Kidney: Renal measurements: 13.6 x 6.5 x 6.1 cm = volume: 285 mL. Echogenicity within normal limits. 5.5 cm upper pole cyst. No mass or hydronephrosis visualized. Bladder: Not distended with a Foley catheter in place. IMPRESSION: 1. Extensive right perinephric fluid with no visible residual hydronephrosis. 2. 5.5 cm simple left renal cyst. Electronically Signed   By: Claudie Revering M.D.   On: 01/07/2019 12:43    Anti-infectives: Anti-infectives (From admission, onward)   Start     Dose/Rate Route Frequency Ordered Stop   01/08/19 1400  vancomycin (VANCOCIN) 1,250 mg in sodium  chloride 0.9 % 250 mL IVPB     1,250 mg 166.7 mL/hr over 90 Minutes Intravenous Every 24 hours 01/07/19 1437     01/07/19 1500  vancomycin (VANCOCIN) 1,500 mg in sodium chloride 0.9 % 500 mL IVPB     1,500 mg 250 mL/hr over 120 Minutes Intravenous  Once 01/07/19 1437 01/07/19 1854   01/06/19 1400  ceFAZolin (ANCEF) IVPB 2g/100 mL premix  Status:  Discontinued     2 g 200 mL/hr over 30 Minutes Intravenous Every 8 hours 01/06/19 1051 01/06/19 1138   01/06/19 1400  cefTRIAXone (ROCEPHIN) 2 g in sodium chloride 0.9 % 100 mL IVPB     2 g 200 mL/hr over 30 Minutes Intravenous Every 24 hours 01/06/19 1138     01/04/19 1800  meropenem (MERREM) 1 g in sodium chloride 0.9 % 100 mL IVPB  Status:  Discontinued     1 g 200 mL/hr over 30 Minutes Intravenous Every 12 hours 01/04/19 1028 01/06/19 1051   01/03/19 2200  meropenem (MERREM) 1 g in sodium chloride 0.9 % 100 mL IVPB  Status:  Discontinued     1 g 200 mL/hr over 30 Minutes Intravenous Every 8 hours 01/03/19 1844 01/04/19 1028   01/03/19 1900  cefTRIAXone (ROCEPHIN) 1 g in sodium chloride 0.9 % 100 mL IVPB     1 g 200 mL/hr over 30 Minutes Intravenous  Once 01/03/19 1846 01/03/19 1919   01/03/19 1823  sodium chloride 0.9 % with cefTRIAXone (ROCEPHIN) ADS Med    Note to Pharmacy:  Ward, Christa   : cabinet override      01/03/19 1823 01/04/19 0629   01/03/19 1652  cefTRIAXone (ROCEPHIN)  1 g in sodium chloride 0.9 % 100 mL IVPB  Status:  Discontinued     1 g 200 mL/hr over 30 Minutes Intravenous 30 min pre-op 01/03/19 1653 01/03/19 1844   01/03/19 1633  cefTRIAXone (ROCEPHIN) 2 g in sodium chloride 0.9 % 100 mL IVPB  Status:  Discontinued     2 g 200 mL/hr over 30 Minutes Intravenous 30 min pre-op 01/03/19 1633 01/03/19 1653   01/03/19 1500  cefTRIAXone (ROCEPHIN) 1 g in sodium chloride 0.9 % 100 mL IVPB  Status:  Discontinued     1 g 200 mL/hr over 30 Minutes Intravenous Every 24 hours 01/03/19 1444 01/03/19 1824   01/03/19 1345   levofloxacin (LEVAQUIN) IVPB 750 mg  Status:  Discontinued     750 mg 100 mL/hr over 90 Minutes Intravenous  Once 01/03/19 1332 01/03/19 1540      Assessment/Plan:  1 - Rt Chronic Hydronephrosis / Partial Ureteral Stricture- stent exchanged, internal renal drainage is adequate.    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 She is not rapidly improving on ABX alone. Rec continue to hold eliquus (last dose was 3/13) and IR perinephric drain tomorrow if possible. NPO p MN.    Alexis Frock 01/09/2019

## 2019-01-10 ENCOUNTER — Encounter (HOSPITAL_COMMUNITY): Payer: Self-pay | Admitting: Radiology

## 2019-01-10 ENCOUNTER — Inpatient Hospital Stay (HOSPITAL_COMMUNITY): Payer: Medicare Other

## 2019-01-10 LAB — CBC
HCT: 41 % (ref 36.0–46.0)
Hemoglobin: 12.5 g/dL (ref 12.0–15.0)
MCH: 27.5 pg (ref 26.0–34.0)
MCHC: 30.5 g/dL (ref 30.0–36.0)
MCV: 90.1 fL (ref 80.0–100.0)
Platelets: 470 10*3/uL — ABNORMAL HIGH (ref 150–400)
RBC: 4.55 MIL/uL (ref 3.87–5.11)
RDW: 15.1 % (ref 11.5–15.5)
WBC: 20.3 10*3/uL — ABNORMAL HIGH (ref 4.0–10.5)
nRBC: 0 % (ref 0.0–0.2)

## 2019-01-10 LAB — BASIC METABOLIC PANEL
Anion gap: 10 (ref 5–15)
BUN: 12 mg/dL (ref 8–23)
CO2: 27 mmol/L (ref 22–32)
Calcium: 7.7 mg/dL — ABNORMAL LOW (ref 8.9–10.3)
Chloride: 98 mmol/L (ref 98–111)
Creatinine, Ser: 0.56 mg/dL (ref 0.44–1.00)
GFR calc Af Amer: >60 mL/min (ref >60)
GFR calc non Af Amer: >60 mL/min (ref >60)
Glucose, Bld: 74 mg/dL (ref 70–99)
Potassium: 3.4 mmol/L — ABNORMAL LOW (ref 3.5–5.1)
Sodium: 135 mmol/L (ref 135–145)

## 2019-01-10 LAB — APTT
aPTT: 41 seconds — ABNORMAL HIGH (ref 24–36)
aPTT: 82 seconds — ABNORMAL HIGH (ref 24–36)

## 2019-01-10 LAB — HEPARIN LEVEL (UNFRACTIONATED)
Heparin Unfractionated: 0.75 IU/mL — ABNORMAL HIGH (ref 0.30–0.70)
Heparin Unfractionated: 0.59 IU/mL (ref 0.30–0.70)

## 2019-01-10 MED ORDER — MIDAZOLAM HCL 2 MG/2ML IJ SOLN
INTRAMUSCULAR | Status: AC
  Administered 2019-01-10: 11:00:00
  Filled 2019-01-10: qty 4

## 2019-01-10 MED ORDER — SODIUM CHLORIDE 0.9% FLUSH
5.0000 mL | Freq: Three times a day (TID) | INTRAVENOUS | Status: DC
  Administered 2019-01-10 – 2019-01-13 (×6): 5 mL

## 2019-01-10 MED ORDER — OXYCODONE-ACETAMINOPHEN 5-325 MG PO TABS
1.0000 | ORAL_TABLET | Freq: Four times a day (QID) | ORAL | Status: DC | PRN
  Administered 2019-01-10 – 2019-01-11 (×2): 1 via ORAL
  Filled 2019-01-10 (×2): qty 1

## 2019-01-10 MED ORDER — FENTANYL CITRATE (PF) 100 MCG/2ML IJ SOLN
INTRAMUSCULAR | Status: AC | PRN
  Administered 2019-01-10: 25 ug via INTRAVENOUS
  Administered 2019-01-10: 50 ug via INTRAVENOUS
  Administered 2019-01-10: 25 ug via INTRAVENOUS

## 2019-01-10 MED ORDER — FENTANYL CITRATE (PF) 100 MCG/2ML IJ SOLN
INTRAMUSCULAR | Status: AC
  Administered 2019-01-10: 11:00:00
  Filled 2019-01-10: qty 2

## 2019-01-10 MED ORDER — HEPARIN (PORCINE) 25000 UT/250ML-% IV SOLN
1800.0000 [IU]/h | INTRAVENOUS | Status: DC
  Administered 2019-01-10: 1900 [IU]/h via INTRAVENOUS
  Filled 2019-01-10: qty 250

## 2019-01-10 MED ORDER — LIDOCAINE HCL (PF) 1 % IJ SOLN
INTRAMUSCULAR | Status: AC | PRN
  Administered 2019-01-10 (×2): 10 mL

## 2019-01-10 MED ORDER — MIDAZOLAM HCL 2 MG/2ML IJ SOLN
INTRAMUSCULAR | Status: AC | PRN
  Administered 2019-01-10: 0.5 mg via INTRAVENOUS
  Administered 2019-01-10: 1 mg via INTRAVENOUS
  Administered 2019-01-10: 0.5 mg via INTRAVENOUS

## 2019-01-10 MED ORDER — LIDOCAINE-EPINEPHRINE (PF) 1 %-1:200000 IJ SOLN
INTRAMUSCULAR | Status: AC | PRN
  Administered 2019-01-10: 10 mL

## 2019-01-10 NOTE — Care Management Important Message (Signed)
Important Message  Patient Details  Name: ADELITA HONE MRN: [id number] Date of Birth: 1941/03/17   Medicare Important Message Given:  Yes    Kerin Salen 01/10/2019, 12:37 [place] Message  Patient Details  Name: KIMBLERY DIOP MRN: [id number] Date of Birth: October 22, 1941   Medicare Important Message Given:  Yes    Kerin Salen 01/10/2019, 12:37 PM

## 2019-01-10 NOTE — Progress Notes (Signed)
PT Cancellation Note  Patient Details Name: [name] [name] MRN: [id number] DOB: 1941/10/27   Cancelled Treatment:    Reason Eval/Treat Not Completed: Patient at procedure or test/unavailable   [name],[name] 01/10/2019, 9:47 AM [name], PT, DPT Acute Rehabilitation Services Office: [phone] Pager: [phone]

## 2019-01-10 NOTE — Progress Notes (Signed)
[place] for IV heparin (on Apixaban PTA) Indication: atrial fibrillation  Allergies  Allergen Reactions  . Codeine Nausea Only and Other (See Comments)    hallucinations  . Hydrocodone Nausea Only and Other (See Comments)  . Macrodantin [Nitrofurantoin] Nausea And Vomiting    Patient Measurements: Height: 5\' 7"  (170.2 cm) Weight: 194 lb 0.1 oz (88 kg) IBW/kg (Calculated) : 61.6 Heparin Dosing Weight: 80.9 kg  Vital Signs: Temp: 98.2 F (36.8 C) (03/16 1340) Temp Source: Oral (03/16 1340) BP: 129/56 (03/16 1340) Pulse Rate: 82 (03/16 1340)  Labs: Recent Labs    01/08/19 0631  01/09/19 0554 01/09/19 1659 01/10/19 0417 01/10/19 1923  HGB 12.7  --  12.8  --  12.5  --   HCT 41.7  --  41.9  --  41.0  --   PLT 450*  --  474*  --  470*  --   APTT  --    < > 35 48* 41* 82*  HEPARINUNFRC  --    < > 1.70* 1.07* 0.75* 0.59  CREATININE 0.74  --  0.63  --  0.56  --    < > = values in this interval not displayed.    Estimated Creatinine Clearance: 68.2 mL/min (by C-G formula based on SCr of 0.56 mg/dL).   Medical History: Past Medical History:  Diagnosis Date  . Abdominal aortic atherosclerosis ([place]) 05/07/2016   Noted on CT abd/pelvis  . Anticoagulant long-term use    ELIQUIS  . Atrial fibrillation with RVR ([place])   . Chronic diastolic (congestive) heart failure ([place])   . Coronary artery disease   . Cystocele, midline    followed by dr c. Marvel Plan ([facility] [place])  pt uses pessary  . Diverticulosis 05/07/2016   Noted on CT abd/pelvis  . Dyspnea    w exertion d/t Afib per pt  . First degree heart block   . GERD (gastroesophageal reflux disease)   . Hematuria    intermittently d/t JJ stent  . Hiatal hernia   . History of arteriovenostomy for renal dialysis ([place])   . History of colon polyps   . History of gastric polyp   . History of kidney stones   . History of peptic ulcer disease   . History  of septic shock    2011 (pt unaware) and 06-04-2013  w/ acute renal failure  . History of uterine cancer    STAGE I  --  S/P TAH W/ BSO  (NO OTHER TX)  . History of ventilator dependency ([place]) 2014  . Hydronephrosis, right 05/07/2016   Moderate, Noted on CT abd/pelvis  . Hyperlipidemia   . Hypertension   . Iron deficiency anemia hematology/ oncology--- dr Bobby Rumpf at [facility] in [place]--- per lov note 08-07-2017  stabilized and felt to be more chronic anemia   01/ 2018  dx iron def. anemia  s/p  IV Iron infusion and taking oral iron supplement  . PAF (paroxysmal atrial fibrillation) ([place]) DX JUNE 2012   CARDIOLOGIST--  DR Agustin Cree ([facility] , --- that office has changed to [facility]))  CHADS2  . Presence of pessary    for midline pessary  . Ureteral stricture, right    has JJ stent  . Wears glasses     Assessment: 66 y/oF presented [facility] ED on 01/03/2019 with severe right flank pain. PMH significant for a-fib (on Apixaban PTA), dCHF, recurrent ureteral obstruction s/p stent replacement  every 5 months. Admitted with hydronephrosis with obstructed right stent, sepsis due to complicated UTI with suspected pyelonephritis. Patient underwent cystoscopy with right stent exchange on 01/03/2019. Apixaban resumed post-operatively. Extensive abdominal cellulitis noted 01/07/19. Ultrasound showed extensive right perinephric fluid collection. CT today shows increasing right perinephric inflammation and fluid as well as new inflammation in the right subcutaneous fat overlying the right renal region, and may represent infection. Apixaban held in case procedure needed. Pharmacy consulted to dose IV heparin while Apixaban on hold.  Today, 01/10/19:  Spoke with Dr. Pascal Lux, he said it was safe to resume IV heparin now after IR placement of perinephric drain. Note that IV heparin was turned off at 0820 in preparation for IR procedure  Last dose of Apixaban 5mg   PO BID   0925 on 3/14  Heparin level is 0.75, falsely elevated due to recent Apixaban use  aPTT still subtherapeutic at 41 sec despite increase in IV heparin rate last night  As Apixaban can falsely elevate heparin levels, will monitor and titrate IV heparin infusion via aPTT until effects of Apixaban have worn off and heparin level and aPTT values correlate  2nd shift update:  Heparin gtt infusing @ 1900 units/hr  APTT = 82 sec  Heparin level = 1.84  No complications of therapy noted  Goal of Therapy:  Heparin level 0.3-0.7 units/ml aPTT 66-102 seconds Monitor platelets by anticoagulation protocol: Yes   Plan:   Continue IV heparin @ 1900 units/hr  Repeat aPTT and heparin level in AM to confirm therapeutic dose.  If aPTT and HL correlate again in the AM, will continue to monitor heparin therapy with heparin levels only.   Daily CBC, heparin level  Monitor closely for s/sx of bleeding   Leone Haven, PharmD 01/10/2019 8:18 PM

## 2019-01-10 NOTE — Progress Notes (Signed)
Pharmacy Antibiotic Note  [name] [name] is a 78 y.o. female admitted on 01/03/2019 with abdominal wall cellulitis.  Pharmacy has been consulted for vancomycin dosing. Note patient also with Ecoli UTI/bacteremia  Today, 01/10/19  Day 7 total abx  Afebrile  WBC 20.3 rising  SCr stable CrCl 68  Plan:  Continue vancomycin 1250mg  IV q24h for now  Ceftriaxone 1g IV q24 ( MD)   Monitor clinical course, renal function, cultures as available  Will check vanc pk/tr if continued tomorrow   Height: 5\' 7"  (170.2 cm) Weight: 194 lb 0.1 oz (88 kg) IBW/kg (Calculated) : 61.6  Temp (24hrs), Avg:98.4 F (36.9 C), Min:98 F (36.7 C), Max:98.9 F (37.2 C)  Recent Labs  Lab 01/06/19 0426 01/07/19 1136 01/08/19 0631 01/09/19 0554 01/10/19 0417  WBC 16.1* 16.9* 16.4* 16.4* 20.3*  CREATININE 0.79 0.65 0.74 0.63 0.56    Estimated Creatinine Clearance: 68.2 mL/min (by C-G formula based on SCr of 0.56 mg/dL).    Allergies  Allergen Reactions  . Codeine Nausea Only and Other (See Comments)    hallucinations  . Hydrocodone Nausea Only and Other (See Comments)  . Macrodantin [Nitrofurantoin] Nausea And Vomiting    Antimicrobials this admission: 3/9 LVQ 750 x 1 3/9 CTX 1 gm x 1 3/9 meropenem>>3/12 3/12 ceftriaxone>> 3/13 vancomycin >>   Dose adjustments this admission:    Microbiology results: 3/9 Ucx x2>>100K Ecoli (R=Cipro, Amp; S=cephalosporins) 3/9 BCx2>> 1 of 4 E coli (sens except R=Cipro)  Thank you for allowing pharmacy to be a part of this patient's care.  Adrian Saran, PharmD, BCPS Pager [phone] 01/10/2019 2:08 PM

## 2019-01-10 NOTE — Progress Notes (Signed)
Referring Physician(s): Manny,T  Supervising Physician: Sandi Mariscal  Patient Status:  [name] - In-pt  Chief Complaint: right flank pain/perinephric fluid collections   Subjective: Patient familiar to IR service from prior right nephrostomy and stent placement in 2014.  She has a history of chronic right hydronephrosis and partial ureteral stricture with multiple stent exchanges, latest on 01/03/2019.  She was recently admitted with severe right flank pain/cellulitis/hydronephrosis/obstructed right stent/sepsis pyelonephritis.  Subsequent imaging has revealed right perinephric inflammation and fluid  In addition to inflammation of the right subcutaneous fat.  Request now received for image guided aspiration and possible drainage of the right perinephric fluid collections.  She currently denies fever, headache, chest pain, dyspnea, cough, nausea, vomiting. She continues to have right lateral abdominal/flank discomfort, some blood in urine.  Past Medical History:  Diagnosis Date   Abdominal aortic atherosclerosis ([place]) 05/07/2016   Noted on CT abd/pelvis   Anticoagulant long-term use    ELIQUIS   Atrial fibrillation with RVR (HCC)    Chronic diastolic (congestive) heart failure (HCC)    Coronary artery disease    Cystocele, midline    followed by dr c. Marvel Plan ([facility] [place])  pt uses pessary   Diverticulosis 05/07/2016   Noted on CT abd/pelvis   Dyspnea    w exertion d/t Afib per pt   First degree heart block    GERD (gastroesophageal reflux disease)    Hematuria    intermittently d/t JJ stent   Hiatal hernia    History of arteriovenostomy for renal dialysis ([place])    History of colon polyps    History of gastric polyp    History of kidney stones    History of peptic ulcer disease    History of septic shock    2011 (pt unaware) and 06-04-2013  w/ acute renal failure   History of uterine cancer    STAGE I  --  S/P TAH W/ BSO   (NO OTHER TX)   History of ventilator dependency ([place]) 2014   Hydronephrosis, right 05/07/2016   Moderate, Noted on CT abd/pelvis   Hyperlipidemia    Hypertension    Iron deficiency anemia hematology/ oncology--- dr Bobby Rumpf at [facility] in [place]--- per lov note 08-07-2017  stabilized and felt to be more chronic anemia   01/ 2018  dx iron def. anemia  s/p  IV Iron infusion and taking oral iron supplement   PAF (paroxysmal atrial fibrillation) ([place]) DX JUNE 2012   CARDIOLOGIST--  DR Agustin Cree ([facility] , [place]--- that office has changed to [facility]))  CHADS2   Presence of pessary    for midline pessary   Ureteral stricture, right    has JJ stent   Wears glasses    Past Surgical History:  Procedure Laterality Date   CARDIOVERSION  09/ 2018   dr Agustin Cree   successful ([facility] )   CHOLECYSTECTOMY  2010   COLONOSCOPY W/ POLYPECTOMY  05/2017   CYSTO/  BALLOON DILATION RIGHT URETERAL STRICTURE/ STENT PLACEMENT  06-02-2013   CYSTO/  BILATERAL RETROGRADE PYELOGRAM/ RIGHT URETEROSCOPY AND STENT PLACEMENT  10-04-2005   CYSTO/ LEFT URETEROSCOPIC STONE EXTRACTION  04-03-2006   CYSTOSCOPY W/ RETROGRADES Right 01/03/2019   Procedure: CYSTOSCOPY WITH RETROGRADE PYELOGRAM RIGHT WITH STENT EXCHANGE;  Surgeon: Irine Seal, MD;  Location: [facility];  Service: Urology;  Laterality: Right;   CYSTOSCOPY W/ URETERAL STENT PLACEMENT Right 02/06/2014   Procedure: CYSTOSCOPY WITH RIGHT RETROGRADE PYELOGRAM RIGHT STENT REMOVAL  and REPLACEMENT, Right Ureteroscopy;  Surgeon: Ailene Rud, MD;  [facility];  Service: Urology;  Laterality: Right;   CYSTOSCOPY W/ URETERAL STENT PLACEMENT Right 11/26/2016   Procedure: CYSTOSCOPY WITH RETROGRADE PYELOGRAM/URETERAL STENT REPLACEMENT;  Surgeon: Alexis Frock, MD;  Location: [facility];  Service: Urology;  Laterality: Right;   CYSTOSCOPY W/ URETERAL STENT PLACEMENT  Right 04/24/2017   Procedure: CYSTOSCOPY WITH RETROGRADE PYELOGRAM/URETERAL STENT REPLACEMENT;  Surgeon: Alexis Frock, MD;  Location: [facility];  Service: Urology;  Laterality: Right;   CYSTOSCOPY W/ URETERAL STENT PLACEMENT Right 10/07/2017   Procedure: CYSTOSCOPY WITH RETROGRADE PYELOGRAM/URETERAL STENT EXCHANGE;  Surgeon: Alexis Frock, MD;  Location: [facility];  Service: Urology;  Laterality: Right;   CYSTOSCOPY W/ URETERAL STENT PLACEMENT Right 03/03/2018   Procedure: CYSTOSCOPY WITH RIGHT RETROGRADE / RIGHT URETERAL STENT EXCHANGE;  Surgeon: Alexis Frock, MD;  Location: [facility];  Service: Urology;  Laterality: Right;   CYSTOSCOPY W/ URETERAL STENT PLACEMENT Right 09/22/2018   Procedure: CYSTOSCOPY WITH RETROGRADE PYELOGRAM/URETERAL STENT PLACEMENT;  Surgeon: Alexis Frock, MD;  Location: [facility];  Service: Urology;  Laterality: Right;  45 MINS   CYSTOSCOPY WITH LITHOLAPAXY N/A 11/21/2013   Procedure: CYSTOSCOPY WITH LITHOLAPAXY;  Surgeon: Ailene Rud, MD;  Location: [facility];  Service: Urology;  Laterality: N/A;   CYSTOSCOPY WITH RETROGRADE PYELOGRAM, URETEROSCOPY AND STENT PLACEMENT Bilateral 03/15/2014   Procedure: CYSTOSCOPY WITH BILATERAL RETROGRADE PYELOGRAM, RIGHT DIAGNOSTIC URETEROSCOPY AND STENT EXCHANGE;  Surgeon: Alexis Frock, MD;  Location: [facility];  Service: Urology;  Laterality: Bilateral;   CYSTOSCOPY WITH STENT PLACEMENT Right 05/07/2016   Procedure: CYSTOSCOPY WITH RETROGRADE PYELOGRAM, URETERAL STENT PLACEMENT;  Surgeon: Franchot Gallo, MD;  Location: [facility];  Service: Urology;  Laterality: Right;   HEMIARTHROPLASTY HIP Right 11/2016   fractured   JOINT REPLACEMENT Right 11/2016   Hip Replacement   LUMBAR FUSION  JULY 2013   NEPHROLITHOTOMY  X2 YRS AGO   PERCUTANEOUS NEPHROSTOMY  2014   TOTAL ABDOMINAL HYSTERECTOMY W/ BILATERAL SALPINGOOPHORECTOMY  1989   TOTAL KNEE  ARTHROPLASTY Bilateral 1992  &  [place] ECHOCARDIOGRAM  07-24-2017   dr Agustin Cree   ef 60-65% (improved from last echo 2014, was 45-50%)/  trace TR/  mild AV sclerosis without stenosis   URETEROSCOPY Right 11/21/2013   Procedure: URETEROSCOPY WITH STENT REMOVAL AND REPLACEMENT;  Surgeon: Ailene Rud, MD;  Location: [facility];  Service: Urology;  Laterality: Right;      Allergies: Codeine; Hydrocodone; and Macrodantin [nitrofurantoin]  Medications: Prior to Admission medications   Medication Sig Start Date End Date Taking? Authorizing Provider  acetaminophen (TYLENOL) 500 MG tablet Take 1,000 mg by mouth 2 (two) times daily as needed for moderate pain or headache.   Yes [provider]  amitriptyline (ELAVIL) 25 MG tablet Take 25 mg by mouth at bedtime.   Yes [provider]  carvedilol (COREG) 6.25 MG tablet TAKE 1/2 TABLET BY MOUTH TWICE DAILY 02/22/18  Yes Park Liter, MD  diltiazem (CARDIZEM CD) 120 MG 24 hr capsule Take 1 capsule (120 mg total) by mouth every morning. Patient taking differently: Take 120 mg by mouth every morning.  07/28/17  Yes Park Liter, MD  ELIQUIS 5 MG TABS tablet Take 1 tablet (5 mg total) by mouth 2 (two) times daily. Patient taking differently: Take 5 mg by mouth 2 (two) times daily.  12/29/18  Yes Park Liter, MD  ferrous sulfate 325 (65 FE) MG EC tablet Take 325 mg by mouth 2 (two) times daily.    Yes [provider]  flecainide (TAMBOCOR) 50 MG tablet Take 1.5 tablets (75 mg total) by mouth 2 (two) times daily. 11/08/18  Yes Park Liter, MD  ketoconazole (NIZORAL) 2 % shampoo Apply 1 application topically daily as needed for rash. 10/26/18  Yes [provider]  omeprazole (PRILOSEC) 40 MG capsule Take 40 mg by mouth every morning.    Yes [provider]  ondansetron (ZOFRAN-ODT) 4 MG disintegrating tablet Take 4 mg by mouth every 6 (six) hours as  needed for nausea/vomiting. 11/16/18  Yes [provider]  traMADol (ULTRAM) 50 MG tablet Take 50 mg by mouth every 6 (six) hours as needed. 11/16/18  Yes [provider]  triamcinolone cream (KENALOG) 0.1 % Apply 1 application topically 2 (two) times daily as needed for rash. 11/13/18  Yes [provider]  clotrimazole-betamethasone (LOTRISONE) cream Apply 1 application topically 2 (two) times daily as needed for rash. 11/17/18   [provider]  furosemide (LASIX) 40 MG tablet Take 40 mg by mouth daily as needed for fluid.     [provider]  potassium chloride (K-DUR) 10 MEQ tablet Take 1 tablet (10 mEq total) by mouth as needed. When you take Lasix. 08/04/17 10/06/19  Park Liter, MD     Vital Signs: BP (!) 144/60 (BP Location: Right Arm)    Pulse 84    Temp 98.3 F (36.8 C) (Oral)    Resp 18    Ht 5\' 7"  (1.702 m)    Wt 194 lb 0.1 oz (88 kg)    SpO2 100%    BMI 30.39 kg/m   Physical Exam awake, alert.  Chest with diminished breath sounds bases.  Heart with regular rate and rhythm.  Abdomen soft, positive bowel sounds, tender right lateral abdominal region with area of cellulitis right flank; no lower extremity edema  Imaging: Ct Abdomen Wo Contrast  Result Date: 01/10/2019 CLINICAL DATA:  Evaluate perinephric fluid collection. EXAM: CT ABDOMEN WITHOUT CONTRAST TECHNIQUE: Multidetector CT imaging of the abdomen was performed following the standard protocol without IV contrast. COMPARISON:  CT abdomen pelvis-12/29/2018; 01/03/2019; 05/07/2016; nuclear medicine renal scintigraphy-06/09/2016 FINDINGS: Lower chest: Limited visualization of the lower thorax demonstrates a small right-sided pleural effusion associated bibasilar heterogeneous/consolidative opacities, right greater than left. Normal heart size.  No pericardial effusion. Hepatobiliary: There is mild nodularity hepatic contour. Post cholecystectomy. Pancreas: Normal noncontrast appearance  of the pancreas. Spleen: Normal noncontrast appearance of the spleen. Adrenals/Urinary Tract: Redemonstrated right-sided double-J ureteral stent with superior coil within the right renal collecting system with associated presumably postoperative air within the right renal collecting system. The amount of ill-defined perinephric fluid is unchanged to very slightly progressed compared to the 01/08/2019 examination. Dominant component about the posterosuperior aspect of the right kidney measures approximately 11.8 x 8.5 cm (image 34, series 2) while additional component about the caudal aspect of the right kidney measures approximately 5.8 x 5.2 cm (image 53, series 2), previously, 11.2 x 9.5 cm and 5.1 x 4.7 cm respectively. High-density material within the right renal collecting system may represent residual excreted contrast. There is no definitive high-density material within this fluid collection to be diagnostic of a urine leak. Unchanged appearance of the approximately 5.4 x 5.0 cm previously characterized cyst arising from the posterosuperior aspect the left kidney (image 31, series 2). Normal noncontrast appearance of the bilateral adrenal glands.  Stomach/Bowel: No evidence of enteric obstruction. No pneumoperitoneum, pneumatosis or portal venous gas. Vascular/Lymphatic: Atherosclerotic plaque within a mildly tortuous but normal caliber abdominal aorta. Scattered retroperitoneal lymph nodes are unchanged with index retrocaval lymph node measuring 1 cm in greatest short axis diameter (image 34, series 2 and index aortocaval lymph node measuring 0.9 cm, presumably reactive in etiology. Other: Redemonstrated extensive asymmetric soft tissue stranding about the right flank with associated apparent skin thickening compatible with known cellulitis. Musculoskeletal: Post L3-L5 paraspinal fusion and intervertebral disc space replacement. Unchanged moderate (approximately 40%) compression deformity involving the  superior endplate of L1. Moderate to severe DDD of L2-L3 with disc space height loss, endplate irregularity and sclerosis. Stigmata of DISH within the lower thoracic spine. IMPRESSION: 1. No change to slight increase in size ill-defined right-sided perinephric fluid. There is no definitive excreted contrast within the ill-defined perinephric fluid collection to provide definitive evidence of a urine leak. No new definable/drainable fluid collections within the abdomen on this noncontrast examination. 2. Unchanged asymmetric right-sided flank subcutaneous edema and skin thickening compatible with known cellulitis. 3.  Aortic Atherosclerosis (ICD10-I70.0). Electronically Signed   By: Sandi Mariscal M.D.   On: 01/10/2019 08:51   Dg Chest 1 View  Result Date: 01/06/2019 CLINICAL DATA:  Fever and shortness of breath on exertion. EXAM: CHEST  1 VIEW COMPARISON:  PA and lateral chest 01/03/2019 and 07/28/2017. CT abdomen and pelvis 01/03/2019. FINDINGS: The lungs are clear clear. Prominent epicardial fat at the left cardiophrenic angle noted, unchanged. No pneumothorax or pleural effusion. Atherosclerosis noted. Heart size is upper normal. IMPRESSION: No acute disease. Electronically Signed   By: Inge Rise M.D.   On: 01/06/2019 15:25   Ct Abdomen Pelvis W Contrast  Result Date: 01/08/2019 CLINICAL DATA:  78 year old female with acute RIGHT flank pain following stent placement on 01/03/2019. RIGHT flank erythema. EXAM: CT ABDOMEN AND PELVIS WITH CONTRAST TECHNIQUE: Multidetector CT imaging of the abdomen and pelvis was performed using the standard protocol following bolus administration of intravenous contrast. CONTRAST:  132mL OMNIPAQUE IOHEXOL 300 MG/ML  SOLN COMPARISON:  01/07/2019 ultrasound and 01/03/2019 CT FINDINGS: Lower chest: New small RIGHT pleural effusion noted with increasing bibasilar atelectasis. Cardiomegaly again noted. Hepatobiliary: No hepatic abnormalities. The patient is status post  cholecystectomy. No biliary dilatation. Pancreas: Unremarkable Spleen: Unremarkable Adrenals/Urinary Tract: A RIGHT ureteral stent is noted with tip and posterior UPPER RIGHT kidney and bladder. Hydronephrosis has slightly decreased. Gas in the renal collecting system may be related from recent procedure. Increasing RIGHT perinephric inflammation/fluid identified with new inflammation in the RIGHT subcutaneous fat overlying the RIGHT kidney. Bilateral renal cortical atrophy noted with unchanged LEFT renal cyst. Gas in the bladder is likely from recent procedure. The adrenal glands are unremarkable. Stomach/Bowel: Stomach is within normal limits. No evidence of bowel wall thickening, distention, or inflammatory changes. Vascular/Lymphatic: Aortic atherosclerosis. No enlarged abdominal or pelvic lymph nodes. Reproductive: Status post hysterectomy. No adnexal masses. Pessary again noted Other: No ascites or pneumoperitoneum. Musculoskeletal: No acute abnormality. L2 compression fracture, L3-L5 lumbar fusion changes and RIGHT total hip arthroplasty changes again noted. IMPRESSION: 1. Increasing RIGHT perinephric inflammation and fluid as well as new inflammation in the RIGHT subcutaneous fat overlying the RIGHT renal region, and may represent infection. 2. New small RIGHT pleural effusion and increasing bibasilar atelectasis. 3. RIGHT ureteral stent with decreased RIGHT hydronephrosis. Gas in the renal collecting system and bladder may be from recent procedure versus infection. Electronically Signed   By: Cleatis Polka.D.  On: 01/08/2019 11:13   US Renal  Result Date: 01/07/2019 CLINICAL DATA:  Right flank pain.  Right ureteral stent EXAM: RENAL / URINARY TRACT ULTRASOUND COMPLETE COMPARISON:  Abdomen pelvis CT dated 01/03/2019. FINDINGS: Right Kidney: Renal measurements: 13.5 x 6.7 x 6.0 cm = volume: 283 mL . Echogenicity within normal limits. No mass or hydronephrosis visualized. Extensive perinephric fluid. Left  Kidney: Renal measurements: 13.6 x 6.5 x 6.1 cm = volume: 285 mL. Echogenicity within normal limits. 5.5 cm upper pole cyst. No mass or hydronephrosis visualized. Bladder: Not distended with a Foley catheter in place. IMPRESSION: 1. Extensive right perinephric fluid with no visible residual hydronephrosis. 2. 5.5 cm simple left renal cyst. Electronically Signed   By: Claudie Revering M.D.   On: 01/07/2019 12:43    Labs:  CBC: Recent Labs    01/07/19 1136 01/08/19 0631 01/09/19 0554 01/10/19 0417  WBC 16.9* 16.4* 16.4* 20.3*  HGB 12.6 12.7 12.8 12.5  HCT 41.7 41.7 41.9 41.0  PLT 430* 450* 474* 470*    COAGS: Recent Labs    01/03/19 1544 01/09/19 0554 01/09/19 1659 01/10/19 0417  APTT 35 35 48* 41*    BMP: Recent Labs    01/07/19 1136 01/08/19 0631 01/09/19 0554 01/10/19 0417  NA 134* 136 137 135  K 3.4* 3.1* 3.4* 3.4*  CL 100 97* 99 98  CO2 26 29 27 27   GLUCOSE 104* 84 83 74  BUN 17 16 12 12   CALCIUM 8.0* 8.0* 8.0* 7.7*  CREATININE 0.65 0.74 0.63 0.56  GFRNONAA >60 >60 >60 >60  GFRAA >60 >60 >60 >60    LIVER FUNCTION TESTS: Recent Labs    01/03/19 1050 01/07/19 1136  BILITOT 1.2 0.9  AST 23 19  ALT 13 13  ALKPHOS 79 59  PROT 7.5 5.6*  ALBUMIN 3.7 2.1*    Assessment and Plan: Pt with history of chronic right hydronephrosis and partial ureteral stricture with multiple stent exchanges, latest on 01/03/2019.  She was recently admitted with severe right flank pain/cellulitis/hydronephrosis/obstructed right stent/sepsis pyelonephritis.  Subsequent imaging has revealed right perinephric inflammation and fluid in addition to  inflammation of the right subcutaneous fat.  Request now received for image guided aspiration and possible drainage of the right perinephric fluid collections.  Imaging studies have been reviewed by Dr. Pascal Lux.  Details/risks of procedure, including but not limited to, internal bleeding, infection, injury to adjacent structures discussed with patient  and husband with their understanding and consent.  Procedure scheduled for this morning.   Electronically Signed: D. Rowe Robert, PA-C 01/10/2019, 9:06 AM   I spent a total of 25 minutes at the the patient's bedside AND on the patient's hospital floor or unit, greater than 50% of which was counseling/coordinating care for image guided aspiration and possible drainage of right perinephric fluid collections    Patient ID: [name] [name], female   DOB: January 01, 1941, 78 y.o.   MRN: [id number]

## 2019-01-10 NOTE — Progress Notes (Signed)
Subjective/Chief Complaint:     1 - Rt Chronic Hydronephrosis / Partial Ureteral Stricture- Multiple prior stone procedures on right including SWL, ureteroscopy, and neph tube.    Recent Course:  02/2018 - Exchange Rt 8x24 polaris stent; 08/2018 Exchange Rt 8x24 polaris stent 01/03/2019 - Exchange Rt 8x24 polaris stent as some hydro / stranding concerning for possible stent occlusion.    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 [place], [place] 01/03/19 - e.coli  Res Cipro, but sens others. Now on rocephin + VAnc.  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 Upper and lower IR perinephric drains placed 3/16. Fluid Cr pending, Fluid CX pending.   Today [name] [name] is s/p IR drainage of right perinephric infected fluid collection.   Objective: Vital signs in last 24 hours: Temp:  [98 F (36.7 C)-98.9 F (37.2 C)] 98.2 F (36.8 C) (03/16 1340) Pulse Rate:  [82-102] 82 (03/16 1340) Resp:  [18-26] 20 (03/16 1340) BP: (129-146)/(56-66) 129/56 (03/16 1340) SpO2:  [88 %-100 %] 98 % (03/16 1340) Weight:  [88 kg] 88 kg (03/16 0708) Last BM Date: 01/09/19  Intake/Output from previous day: 03/15 0701 - 03/16 0700 In: 1518.7 [I.V.:1168.7; IV Piggyback:350] Out: 300 [Urine:300] Intake/Output this shift: Total I/O In: 774.5 [I.V.:664.5; Other:10; IV Piggyback:100] Out: 655 [Urine:200; Drains:455]    General appearance: alert, cooperative and at baseline, very pleasant Eyes: negative Nose: Nares normal. Septum midline. Mucosa normal. No drainage or sinus tenderness. Throat: lips, mucosa, and tongue normal; teeth and gums normal Neck: supple, symmetrical, trachea midline Back: symmetric, no curvature. ROM normal. Rt flank now with upper and lower perinephric drain in placed with  foul fluid. Resp: non-labored on room air.  Cardio: mild tachycardia.  Extremities: extremities normal, atraumatic, no cyanosis or edema Lymph nodes: Cervical, supraclavicular, and axillary nodes normal.  Lab Results:  Recent Labs    01/09/19 0554 01/10/19 0417  WBC 16.4* 20.3*  HGB 12.8 12.5  HCT 41.9 41.0  PLT 474* 470*   BMET Recent Labs    01/09/19 0554 01/10/19 0417  NA 137 135  K 3.4* 3.4*  CL 99 98  CO2 27 27  GLUCOSE 83 74  BUN 12 12  CREATININE 0.63 0.56  CALCIUM 8.0* 7.7*   PT/INR No results for input(s): LABPROT, INR in the last 72 hours. ABG No results for input(s): PHART, HCO3 in the last 72 hours.  Invalid input(s): PCO2, PO2  Studies/Results: Ct Abdomen Wo Contrast  Result Date: 01/10/2019 CLINICAL DATA:  Evaluate perinephric fluid collection. EXAM: CT ABDOMEN WITHOUT CONTRAST TECHNIQUE: Multidetector CT imaging of the abdomen was performed following the standard protocol without IV contrast. COMPARISON:  CT abdomen pelvis-12/29/2018; 01/03/2019; 05/07/2016; nuclear medicine renal scintigraphy-06/09/2016 FINDINGS: Lower chest: Limited visualization of the lower thorax demonstrates a small right-sided pleural effusion associated bibasilar heterogeneous/consolidative opacities, right greater than left. Normal heart size.  No pericardial effusion. Hepatobiliary: There is mild nodularity hepatic contour. Post cholecystectomy. Pancreas: Normal noncontrast appearance of the pancreas. Spleen: Normal noncontrast appearance of the spleen. Adrenals/Urinary Tract: Redemonstrated right-sided double-J ureteral stent with superior coil within the right renal collecting system with associated presumably postoperative air within the right renal collecting system. The amount of ill-defined perinephric fluid is unchanged to very slightly progressed compared to the 01/08/2019 examination. Dominant component about the posterosuperior aspect of the right  kidney measures approximately  11.8 x 8.5 cm (image 34, series 2) while additional component about the caudal aspect of the right kidney measures approximately 5.8 x 5.2 cm (image 53, series 2), previously, 11.2 x 9.5 cm and 5.1 x 4.7 cm respectively. High-density material within the right renal collecting system may represent residual excreted contrast. There is no definitive high-density material within this fluid collection to be diagnostic of a urine leak. Unchanged appearance of the approximately 5.4 x 5.0 cm previously characterized cyst arising from the posterosuperior aspect the left kidney (image 31, series 2). Normal noncontrast appearance of the bilateral adrenal glands. Stomach/Bowel: No evidence of enteric obstruction. No pneumoperitoneum, pneumatosis or portal venous gas. Vascular/Lymphatic: Atherosclerotic plaque within a mildly tortuous but normal caliber abdominal aorta. Scattered retroperitoneal lymph nodes are unchanged with index retrocaval lymph node measuring 1 cm in greatest short axis diameter (image 34, series 2 and index aortocaval lymph node measuring 0.9 cm, presumably reactive in etiology. Other: Redemonstrated extensive asymmetric soft tissue stranding about the right flank with associated apparent skin thickening compatible with known cellulitis. Musculoskeletal: Post L3-L5 paraspinal fusion and intervertebral disc space replacement. Unchanged moderate (approximately 40%) compression deformity involving the superior endplate of L1. Moderate to severe DDD of L2-L3 with disc space height loss, endplate irregularity and sclerosis. Stigmata of DISH within the lower thoracic spine. IMPRESSION: 1. No change to slight increase in size ill-defined right-sided perinephric fluid. There is no definitive excreted contrast within the ill-defined perinephric fluid collection to provide definitive evidence of a urine leak. No new definable/drainable fluid collections within the abdomen on this noncontrast examination. 2. Unchanged  asymmetric right-sided flank subcutaneous edema and skin thickening compatible with known cellulitis. 3.  Aortic Atherosclerosis (ICD10-I70.0). Electronically Signed   By: Sandi Mariscal M.D.   On: 01/10/2019 08:51   Ct Image Guided Fluid Drain By Catheter  Result Date: 01/10/2019 INDICATION: History of chronic right-sided double-J ureteral stent with recent difficulty exchange now with postprocedural abdominal pain and fevers with CT scan demonstrating complex right-sided perinephric fluid collection and overlying cellulitis Request made for CT-guided aspiration(s) and/or drainage catheter(s) placement for infection source control purposes. EXAM: CT-GUIDED RIGHT-SIDED PERINEPHRIC RENAL ABSCESS DRAINAGE CATHETER PLACEMENT X2 COMPARISON:  CT abdomen pelvis-01/09/2019; 01/08/2019 MEDICATIONS: The patient is currently admitted to the hospital and receiving intravenous antibiotics. The antibiotics were administered within an appropriate time frame prior to the initiation of the procedure. ANESTHESIA/SEDATION: The patient's level of consciousness and vital signs were monitored continuously by radiology nursing throughout the procedure under my direct supervision. Fentanyl 100 mcg IV; Versed 2 mg IV Moderate Sedation Time:  25 minutes CONTRAST:  None COMPLICATIONS: None immediate. PROCEDURE: Informed written consent was obtained from the patient after a discussion of the risks, benefits and alternatives to treatment. The patient was placed prone on the CT gantry and a pre procedural CT was performed re-demonstrating the known abscess/fluid collection within the right perinephric space with dominant component about the superior aspect of the right kidney measuring at least 10.9 x 9.3 cm (image 36, series 2 and dominant component about the caudal aspect of the right kidney measuring approximately 7.0 x 5.1 cm (image 65, series 2). The procedure was planned. A timeout was performed prior to the initiation of the procedure.  The skin overlying the right mid back and flank was prepped and draped in the usual sterile fashion. The overlying soft tissues were anesthetized with 1% lidocaine with epinephrine. Appropriate trajectory towards both the cranial and caudal component of the  perirenal fluid collection was planned with the use of a 22 gauge spinal needles. 18 gauge trocar needles were advanced into the abscess/fluid collections and a short Amplatz super stiff wire was coiled within both collections. Appropriate positioning was confirmed with a limited CT scan. The tract was serially dilated allowing placement of a 10 [place] all-purpose drainage catheters. Appropriate positioning was confirmed with a limited postprocedural CT scan. Approximately 140 cc of purulent fluid was aspirated from the dominant cranial component of the collection and additional 45 cc of purulent fluid was aspirated from the caudal component of the perinephric fluid collection. Both catheters were connected to JP bulbs and sutured in place. A dressing was placed. The patient tolerated the procedure well without immediate post procedural complication. IMPRESSION: 1. Successful CT guided placement of a 10 [place] all purpose drain catheter into the dominant cranial component of the right-sided perinephric abscess with aspiration of 140 mL of purulent fluid. 2. Successful CT-guided placement of a 10 [place] all-purpose drainage catheter into the dominant caudal component of the right-sided perinephric abscess with aspiration of 45 cc of purulent fluid. 3. Representative sample from the aspirated fluid was capped and sent to the laboratory for analysis. Electronically Signed   By: Sandi Mariscal M.D.   On: 01/10/2019 12:10   Ct Image Guided Drainage Percut Cath  Peritoneal Retroperit  Result Date: 01/10/2019 INDICATION: History of chronic right-sided double-J ureteral stent with recent difficulty exchange now with postprocedural abdominal pain and fevers with CT scan  demonstrating complex right-sided perinephric fluid collection and overlying cellulitis Request made for CT-guided aspiration(s) and/or drainage catheter(s) placement for infection source control purposes. EXAM: CT-GUIDED RIGHT-SIDED PERINEPHRIC RENAL ABSCESS DRAINAGE CATHETER PLACEMENT X2 COMPARISON:  CT abdomen pelvis-01/09/2019; 01/08/2019 MEDICATIONS: The patient is currently admitted to the hospital and receiving intravenous antibiotics. The antibiotics were administered within an appropriate time frame prior to the initiation of the procedure. ANESTHESIA/SEDATION: The patient's level of consciousness and vital signs were monitored continuously by radiology nursing throughout the procedure under my direct supervision. Fentanyl 100 mcg IV; Versed 2 mg IV Moderate Sedation Time:  25 minutes CONTRAST:  None COMPLICATIONS: None immediate. PROCEDURE: Informed written consent was obtained from the patient after a discussion of the risks, benefits and alternatives to treatment. The patient was placed prone on the CT gantry and a pre procedural CT was performed re-demonstrating the known abscess/fluid collection within the right perinephric space with dominant component about the superior aspect of the right kidney measuring at least 10.9 x 9.3 cm (image 36, series 2 and dominant component about the caudal aspect of the right kidney measuring approximately 7.0 x 5.1 cm (image 65, series 2). The procedure was planned. A timeout was performed prior to the initiation of the procedure. The skin overlying the right mid back and flank was prepped and draped in the usual sterile fashion. The overlying soft tissues were anesthetized with 1% lidocaine with epinephrine. Appropriate trajectory towards both the cranial and caudal component of the perirenal fluid collection was planned with the use of a 22 gauge spinal needles. 18 gauge trocar needles were advanced into the abscess/fluid collections and a short Amplatz super stiff  wire was coiled within both collections. Appropriate positioning was confirmed with a limited CT scan. The tract was serially dilated allowing placement of a 10 [place] all-purpose drainage catheters. Appropriate positioning was confirmed with a limited postprocedural CT scan. Approximately 140 cc of purulent fluid was aspirated from the dominant cranial component of the collection and additional  45 cc of purulent fluid was aspirated from the caudal component of the perinephric fluid collection. Both catheters were connected to JP bulbs and sutured in place. A dressing was placed. The patient tolerated the procedure well without immediate post procedural complication. IMPRESSION: 1. Successful CT guided placement of a 10 [place] all purpose drain catheter into the dominant cranial component of the right-sided perinephric abscess with aspiration of 140 mL of purulent fluid. 2. Successful CT-guided placement of a 10 [place] all-purpose drainage catheter into the dominant caudal component of the right-sided perinephric abscess with aspiration of 45 cc of purulent fluid. 3. Representative sample from the aspirated fluid was capped and sent to the laboratory for analysis. Electronically Signed   By: Sandi Mariscal M.D.   On: 01/10/2019 12:10    Anti-infectives: Anti-infectives (From admission, onward)   Start     Dose/Rate Route Frequency Ordered Stop   01/08/19 1400  vancomycin (VANCOCIN) 1,250 mg in sodium chloride 0.9 % 250 mL IVPB     1,250 mg 166.7 mL/hr over 90 Minutes Intravenous Every 24 hours 01/07/19 1437     01/07/19 1500  vancomycin (VANCOCIN) 1,500 mg in sodium chloride 0.9 % 500 mL IVPB     1,500 mg 250 mL/hr over 120 Minutes Intravenous  Once 01/07/19 1437 01/07/19 1854   01/06/19 1400  ceFAZolin (ANCEF) IVPB 2g/100 mL premix  Status:  Discontinued     2 g 200 mL/hr over 30 Minutes Intravenous Every 8 hours 01/06/19 1051 01/06/19 1138   01/06/19 1400  cefTRIAXone (ROCEPHIN) 2 g in sodium chloride  0.9 % 100 mL IVPB     2 g 200 mL/hr over 30 Minutes Intravenous Every 24 hours 01/06/19 1138     01/04/19 1800  meropenem (MERREM) 1 g in sodium chloride 0.9 % 100 mL IVPB  Status:  Discontinued     1 g 200 mL/hr over 30 Minutes Intravenous Every 12 hours 01/04/19 1028 01/06/19 1051   01/03/19 2200  meropenem (MERREM) 1 g in sodium chloride 0.9 % 100 mL IVPB  Status:  Discontinued     1 g 200 mL/hr over 30 Minutes Intravenous Every 8 hours 01/03/19 1844 01/04/19 1028   01/03/19 1900  cefTRIAXone (ROCEPHIN) 1 g in sodium chloride 0.9 % 100 mL IVPB     1 g 200 mL/hr over 30 Minutes Intravenous  Once 01/03/19 1846 01/03/19 1919   01/03/19 1823  sodium chloride 0.9 % with cefTRIAXone (ROCEPHIN) ADS Med    Note to Pharmacy:  Ward, Christa   : cabinet override      01/03/19 1823 01/04/19 0629   01/03/19 1652  cefTRIAXone (ROCEPHIN) 1 g in sodium chloride 0.9 % 100 mL IVPB  Status:  Discontinued     1 g 200 mL/hr over 30 Minutes Intravenous 30 min pre-op 01/03/19 1653 01/03/19 1844   01/03/19 1633  cefTRIAXone (ROCEPHIN) 2 g in sodium chloride 0.9 % 100 mL IVPB  Status:  Discontinued     2 g 200 mL/hr over 30 Minutes Intravenous 30 min pre-op 01/03/19 1633 01/03/19 1653   01/03/19 1500  cefTRIAXone (ROCEPHIN) 1 g in sodium chloride 0.9 % 100 mL IVPB  Status:  Discontinued     1 g 200 mL/hr over 30 Minutes Intravenous Every 24 hours 01/03/19 1444 01/03/19 1824   01/03/19 1345  levofloxacin (LEVAQUIN) IVPB 750 mg  Status:  Discontinued     750 mg 100 mL/hr over 90 Minutes Intravenous  Once 01/03/19 1332 01/03/19 1540  Assessment/Plan:  1 - Rt Chronic Hydronephrosis / Partial Ureteral Stricture- stent exchanged, internal renal drainage is adequate.    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 Appreciate IR drainage, these appear in good position.  Furhter TX based on Cr and CX data from fluid. Discussed with pt and daughter.   Alexis Frock 01/10/2019

## 2019-01-10 NOTE — Progress Notes (Signed)
PROGRESS NOTE    [name] [name]  SWF:[id number] DOB: August 20, 1941 DOA: 01/03/2019 PCP: [name], MD   Brief Narrative: 77 y.o.femalewith medical history significant forparoxysmal A. fib on Eliquis, chronic diastolic CHF, recurrent ureteral obstruction post ureteral stent placement every 5 months, hyperlipidemia, GERD who presented to [facility] ED with complaints of sudden onset severe right flank pain that started earlier this morning around 4 AM. Associated with chills. Last visit with her urologist was 2-3 weeks ago. She was thentreated with Bactrim and completed her course of antibiotic for UTI.No other changes to her medications.  Inthe ED, vital signsareremarkable for tachycardia with rate of 114,WBC 17 K and urine analysis positive for pyuria. CT abdomen pelvis with contrast done on 01/03/2019 reveals moderate to moderately severe right hydronephrosis with a right double-J ureteral stent in good position and extensive stranding about the right kidney and proximal ureter worse than on the prior exam and worrisome for urinary tract infection/urethritis. ED physician consulted urology. TRH asked to admit  Taken to the OR by urology on 01/03/19 for replacement of her R ureter stent  Assessment & Plan:   Active Problems:   Hydronephrosis   Urinary tract infection without hematuria   Occlusion of ureteral stent (HCC)  Moderately severe right hydronephrosis withobstructed right stent Findings on CT abdomen and pelvis with contrast post right stent replacement on 01/03/2019 by Dr.Wrenn. Urine culture and blood culture with E. coli sensitive to Rocephin. Meropenem DC'd Rocephin started.  Patient now with right flank cellulitis on vancomycin and Rocephin with no significant improvement.  Ultrasound showed perinephric fluid collection extensively.  CT of the abdomen and pelvis showedincreasing right perinephric inflammation and fluid as well as new inflammation in the right  subcutaneous fat overlying the right renal region.  New small right pleural effusion increasing bibasilar atelectasis.  Right ureteral stent with decreased right hydronephrosis.  Gas in the renal collecting system and bladder may be from recent procedure.  Patient continues with leukocytosis and now again with fever.    IR to place right perinephric drain today.  Continue vancomycin and Rocephin.  Patient still continues to have leukocytosis which is actually worsened today.    AKI, suspect post obstructivehas been resolved. Patient was on Lasix at home which is on hold at this time.  History of recurrent ureteral obstruction Has ureteral stent in place Follows with urology outpatient Dr. Tresa Moore Self-reported ureteral stentarereplaced every 5 months Urology followingdiscussed with Dr. Tresa Moore  Paroxysmal A. Fib Rate controlled with Coreg and diltiazem Continue Coreg 6.25 mg twice daily, diltiazem 120 mg daily.  Currently on heparin drip as a bridge since Eliquis has to be on hold for the procedure tomorrow.  GERD Continue omeprazole  Chronic diastolic CHFpatient is on 2 L of oxygen. She reports she does not have oxygen at home. Her ambulatory oxygen saturation dropped so she was placed on 2 L of oxygen. Chest x-ray shows clear lungs. Encourage her to use incentive spirometry as much as possible.  DVT prophylaxis:Heparin as patient going for procedure Eliquis was stopped  Code Status:Full code as confirmed by patient herself.  Family Communication:Discussed with husband  Disposition Plan:Pending clinical improvement   Estimated body mass index is 30.39 kg/m as calculated from the following:   Height as of this encounter: 5\' 7"  (1.702 m).   Weight as of this encounter: 88 kg.   Subjective:  Patient resting in bed husband by the bedside reports that she was taking Dilaudid very frequently overnight with increased  pain in the right flank Objective: Vitals:    01/10/19 0708 01/10/19 0940 01/10/19 0955 01/10/19 1000  BP:  (!) 143/64 135/61 129/64  Pulse:  93 95 98  Resp:  (!) 26 (!) 21 (!) 25  Temp:      TempSrc:      SpO2:  94% 90% 93%  Weight: 88 kg     Height:        Intake/Output Summary (Last 24 hours) at 01/10/2019 1007 Last data filed at 01/10/2019 0655 Gross per 24 hour  Intake 1518.73 ml  Output 300 ml  Net 1218.73 ml   Filed Weights   01/07/19 0636 01/08/19 0500 01/10/19 0708  Weight: 90.6 kg 89.9 kg 88 kg    Examination:  General exam: Appears calm and comfortable  Respiratory system: Clear to auscultation. Respiratory effort normal. Cardiovascular system: S1 & S2 heard, RRR. No JVD, murmurs, rubs, gallops or clicks. No pedal edema. Gastrointestinal system: Abdomen is nondistended, soft and nontender. No organomegaly or masses felt. Normal bowel sounds heard.  Right flank area with receding erythema however still edema tenderness and erythema present. Central nervous system: Alert and oriented. No focal neurological deficits. Extremities: Symmetric 5 x 5 power. Skin: No rashes, lesions or ulcers Psychiatry: Judgement and insight appear normal. Mood & affect appropriate.     Data Reviewed: I have personally reviewed following labs and imaging studies  CBC: Recent Labs  Lab 01/06/19 0426 01/07/19 1136 01/08/19 0631 01/09/19 0554 01/10/19 0417  WBC 16.1* 16.9* 16.4* 16.4* 20.3*  HGB 13.1 12.6 12.7 12.8 12.5  HCT 43.3 41.7 41.7 41.9 41.0  MCV 89.1 88.7 90.1 89.7 90.1  PLT 431* 430* 450* 474* 867*   Basic Metabolic Panel: Recent Labs  Lab 01/06/19 0426 01/07/19 1136 01/08/19 0631 01/09/19 0554 01/10/19 0417  NA 137 134* 136 137 135  K 4.0 3.4* 3.1* 3.4* 3.4*  CL 103 100 97* 99 98  CO2 24 26 29 27 27   GLUCOSE 99 104* 84 83 74  BUN 26* 17 16 12 12   CREATININE 0.79 0.65 0.74 0.63 0.56  CALCIUM 8.4* 8.0* 8.0* 8.0* 7.7*   GFR: Estimated Creatinine Clearance: 68.2 mL/min (by C-G formula based on SCr of  0.56 mg/dL). Liver Function Tests: Recent Labs  Lab 01/03/19 1050 01/07/19 1136  AST 23 19  ALT 13 13  ALKPHOS 79 59  BILITOT 1.2 0.9  PROT 7.5 5.6*  ALBUMIN 3.7 2.1*   Recent Labs  Lab 01/03/19 1048  LIPASE 29   No results for input(s): AMMONIA in the last 168 hours. Coagulation Profile: No results for input(s): INR, PROTIME in the last 168 hours. Cardiac Enzymes: Recent Labs  Lab 01/03/19 1050  TROPONINI <0.03   BNP (last 3 results) No results for input(s): PROBNP in the last 8760 hours. HbA1C: No results for input(s): HGBA1C in the last 72 hours. CBG: No results for input(s): GLUCAP in the last 168 hours. Lipid Profile: No results for input(s): CHOL, HDL, LDLCALC, TRIG, CHOLHDL, LDLDIRECT in the last 72 hours. Thyroid Function Tests: No results for input(s): TSH, T4TOTAL, FREET4, T3FREE, THYROIDAB in the last 72 hours. Anemia Panel: No results for input(s): VITAMINB12, FOLATE, FERRITIN, TIBC, IRON, RETICCTPCT in the last 72 hours. Sepsis Labs: No results for input(s): PROCALCITON, LATICACIDVEN in the last 168 hours.  Recent Results (from the past 240 hour(s))  Urine Culture     Status: Abnormal   Collection Time: 01/03/19 10:50 AM  Result Value Ref Range Status   Specimen  Description   Final    URINE, CLEAN CATCH Performed at [facility], [place] [address], [place], [place] [address]    Special Requests   Final    NONE Performed at [facility], [place] [address] [place], [place] [address]    Culture >=100,000 COLONIES/mL ESCHERICHIA COLI (A)  Final   Report Status 01/06/2019 FINAL  Final   Organism ID, Bacteria ESCHERICHIA COLI (A)  Final      Susceptibility   Escherichia coli - MIC*    AMPICILLIN 16 INTERMEDIATE Intermediate     CEFAZOLIN <=4 SENSITIVE Sensitive     CEFTRIAXONE <=1 SENSITIVE Sensitive     CIPROFLOXACIN >=4 RESISTANT Resistant     GENTAMICIN 4 SENSITIVE Sensitive     IMIPENEM 0.5 SENSITIVE Sensitive      NITROFURANTOIN <=16 SENSITIVE Sensitive     TRIMETH/SULFA <=20 SENSITIVE Sensitive     AMPICILLIN/SULBACTAM 4 SENSITIVE Sensitive     PIP/TAZO 8 SENSITIVE Sensitive     Extended ESBL NEGATIVE Sensitive     * >=100,000 COLONIES/mL ESCHERICHIA COLI  Culture, blood (routine x 2)     Status: Abnormal   Collection Time: 01/03/19  3:44 PM  Result Value Ref Range Status   Specimen Description   Final    BLOOD LEFT ARM Performed at [facility] [address] [place], [place] [address]    Special Requests   Final    BOTTLES DRAWN AEROBIC ONLY Blood Culture adequate volume Performed at [facility] [address] [place], [place] [address]    Culture  Setup Time   Final    GRAM NEGATIVE RODS AEROBIC BOTTLE ONLY CRITICAL RESULT CALLED TO, READ BACK BY AND VERIFIED WITH: S CHRISTY PHARMD 2025 01/04/19 A BROWNING Performed at [facility], [place] [employer] [employer] [address] [place], [place] [address]    Culture ESCHERICHIA COLI (A)  Final   Report Status 01/06/2019 FINAL  Final   Organism ID, Bacteria ESCHERICHIA COLI  Final      Susceptibility   Escherichia coli - MIC*    AMPICILLIN <=2 SENSITIVE Sensitive     CEFAZOLIN <=4 SENSITIVE Sensitive     CEFEPIME <=1 SENSITIVE Sensitive     CEFTAZIDIME <=1 SENSITIVE Sensitive     CEFTRIAXONE <=1 SENSITIVE Sensitive     CIPROFLOXACIN >=4 RESISTANT Resistant     GENTAMICIN 4 SENSITIVE Sensitive     IMIPENEM <=0.25 SENSITIVE Sensitive     TRIMETH/SULFA <=20 SENSITIVE Sensitive     AMPICILLIN/SULBACTAM <=2 SENSITIVE Sensitive     PIP/TAZO <=4 SENSITIVE Sensitive     Extended ESBL NEGATIVE Sensitive     * ESCHERICHIA COLI  Culture, blood (routine x 2)     Status: None   Collection Time: 01/03/19  3:44 PM  Result Value Ref Range Status   Specimen Description   Final    BLOOD RIGHT HAND Performed at [facility] [address] [place], [place] [address]    Special Requests   Final     BOTTLES DRAWN AEROBIC ONLY Blood Culture adequate volume Performed at [facility] [address] [place], [place] [address]    Culture   Final    NO GROWTH 5 DAYS Performed at [facility], [place] [address] [place],  [address]    Report Status 01/08/2019 FINAL  Final  Blood Culture ID Panel (Reflexed)     Status: Abnormal   Collection Time: 01/03/19  3:44 PM  Result Value Ref Range Status   Enterococcus species NOT DETECTED NOT DETECTED Final   Listeria monocytogenes NOT DETECTED NOT DETECTED Final   Staphylococcus species NOT DETECTED NOT DETECTED Final   Staphylococcus aureus (BCID) NOT DETECTED NOT DETECTED Final   Streptococcus species NOT DETECTED NOT DETECTED Final   Streptococcus agalactiae NOT DETECTED NOT DETECTED Final   Streptococcus pneumoniae NOT DETECTED NOT DETECTED Final   Streptococcus pyogenes NOT DETECTED NOT DETECTED Final   Acinetobacter baumannii NOT DETECTED NOT DETECTED Final   Enterobacteriaceae species DETECTED (A) NOT DETECTED Final    Comment: Enterobacteriaceae represent a large family of gram-negative bacteria, not a single organism. CRITICAL RESULT CALLED TO, READ BACK BY AND VERIFIED WITH: S CHRISTY PHARMD 2025 01/04/19 A BROWNING    Enterobacter cloacae complex NOT DETECTED NOT DETECTED Final   Escherichia coli DETECTED (A) NOT DETECTED Final    Comment: CRITICAL RESULT CALLED TO, READ BACK BY AND VERIFIED WITH: S CHRISTY PHARMD 2025 01/04/19 A BROWNING    Klebsiella oxytoca NOT DETECTED NOT DETECTED Final   Klebsiella pneumoniae NOT DETECTED NOT DETECTED Final   Proteus species NOT DETECTED NOT DETECTED Final   Serratia marcescens NOT DETECTED NOT DETECTED Final   Carbapenem resistance NOT DETECTED NOT DETECTED Final   Haemophilus influenzae NOT DETECTED NOT DETECTED Final   Neisseria meningitidis NOT DETECTED NOT DETECTED Final   Pseudomonas aeruginosa NOT DETECTED NOT DETECTED Final   Candida albicans NOT  DETECTED NOT DETECTED Final   Candida glabrata NOT DETECTED NOT DETECTED Final   Candida krusei NOT DETECTED NOT DETECTED Final   Candida parapsilosis NOT DETECTED NOT DETECTED Final   Candida tropicalis NOT DETECTED NOT DETECTED Final    Comment: Performed at [facility], [place]. [address], [place], [place] [address]  Culture, Urine     Status: Abnormal   Collection Time: 01/03/19  5:53 PM  Result Value Ref Range Status   Specimen Description   Final    CYSTOSCOPY RIGHT KIDNEY Performed at [facility] [address] [place], [place] [address]    Special Requests   Final    NONE Performed at [facility], [place] [address] [place], [place] [address]    Culture >=100,000 COLONIES/mL ESCHERICHIA COLI (A)  Final   Report Status 01/06/2019 FINAL  Final   Organism ID, Bacteria ESCHERICHIA COLI (A)  Final      Susceptibility   Escherichia coli - MIC*    AMPICILLIN 16 INTERMEDIATE Intermediate     CEFAZOLIN <=4 SENSITIVE Sensitive     CEFEPIME <=1 SENSITIVE Sensitive     CEFTAZIDIME <=1 SENSITIVE Sensitive     CEFTRIAXONE <=1 SENSITIVE Sensitive     CIPROFLOXACIN >=4 RESISTANT Resistant     GENTAMICIN <=1 SENSITIVE Sensitive     IMIPENEM <=0.25 SENSITIVE Sensitive     TRIMETH/SULFA <=20 SENSITIVE Sensitive     AMPICILLIN/SULBACTAM 4 SENSITIVE Sensitive     PIP/TAZO <=4 SENSITIVE Sensitive     Extended ESBL NEGATIVE Sensitive     * >=100,000 COLONIES/mL ESCHERICHIA COLI         Radiology Studies: Ct Abdomen Wo Contrast  Result Date: 01/10/2019 CLINICAL DATA:  Evaluate perinephric fluid collection. EXAM: CT ABDOMEN WITHOUT CONTRAST TECHNIQUE: Multidetector CT imaging of the abdomen was performed following the standard protocol without IV contrast. COMPARISON:  CT abdomen pelvis-12/29/2018; 01/03/2019; 05/07/2016; nuclear medicine renal scintigraphy-06/09/2016 FINDINGS: Lower chest: Limited visualization of the lower thorax demonstrates a  small right-sided pleural effusion associated bibasilar  heterogeneous/consolidative opacities, right greater than left. Normal heart size.  No pericardial effusion. Hepatobiliary: There is mild nodularity hepatic contour. Post cholecystectomy. Pancreas: Normal noncontrast appearance of the pancreas. Spleen: Normal noncontrast appearance of the spleen. Adrenals/Urinary Tract: Redemonstrated right-sided double-J ureteral stent with superior coil within the right renal collecting system with associated presumably postoperative air within the right renal collecting system. The amount of ill-defined perinephric fluid is unchanged to very slightly progressed compared to the 01/08/2019 examination. Dominant component about the posterosuperior aspect of the right kidney measures approximately 11.8 x 8.5 cm (image 34, series 2) while additional component about the caudal aspect of the right kidney measures approximately 5.8 x 5.2 cm (image 53, series 2), previously, 11.2 x 9.5 cm and 5.1 x 4.7 cm respectively. High-density material within the right renal collecting system may represent residual excreted contrast. There is no definitive high-density material within this fluid collection to be diagnostic of a urine leak. Unchanged appearance of the approximately 5.4 x 5.0 cm previously characterized cyst arising from the posterosuperior aspect the left kidney (image 31, series 2). Normal noncontrast appearance of the bilateral adrenal glands. Stomach/Bowel: No evidence of enteric obstruction. No pneumoperitoneum, pneumatosis or portal venous gas. Vascular/Lymphatic: Atherosclerotic plaque within a mildly tortuous but normal caliber abdominal aorta. Scattered retroperitoneal lymph nodes are unchanged with index retrocaval lymph node measuring 1 cm in greatest short axis diameter (image 34, series 2 and index aortocaval lymph node measuring 0.9 cm, presumably reactive in etiology. Other: Redemonstrated extensive asymmetric soft  tissue stranding about the right flank with associated apparent skin thickening compatible with known cellulitis. Musculoskeletal: Post L3-L5 paraspinal fusion and intervertebral disc space replacement. Unchanged moderate (approximately 40%) compression deformity involving the superior endplate of L1. Moderate to severe DDD of L2-L3 with disc space height loss, endplate irregularity and sclerosis. Stigmata of DISH within the lower thoracic spine. IMPRESSION: 1. No change to slight increase in size ill-defined right-sided perinephric fluid. There is no definitive excreted contrast within the ill-defined perinephric fluid collection to provide definitive evidence of a urine leak. No new definable/drainable fluid collections within the abdomen on this noncontrast examination. 2. Unchanged asymmetric right-sided flank subcutaneous edema and skin thickening compatible with known cellulitis. 3.  Aortic Atherosclerosis (ICD10-I70.0). Electronically Signed   By: Sandi Mariscal M.D.   On: 01/10/2019 08:51   Ct Abdomen Pelvis W Contrast  Result Date: 01/08/2019 CLINICAL DATA:  78 year old female with acute RIGHT flank pain following stent placement on 01/03/2019. RIGHT flank erythema. EXAM: CT ABDOMEN AND PELVIS WITH CONTRAST TECHNIQUE: Multidetector CT imaging of the abdomen and pelvis was performed using the standard protocol following bolus administration of intravenous contrast. CONTRAST:  153mL OMNIPAQUE IOHEXOL 300 MG/ML  SOLN COMPARISON:  01/07/2019 ultrasound and 01/03/2019 CT FINDINGS: Lower chest: New small RIGHT pleural effusion noted with increasing bibasilar atelectasis. Cardiomegaly again noted. Hepatobiliary: No hepatic abnormalities. The patient is status post cholecystectomy. No biliary dilatation. Pancreas: Unremarkable Spleen: Unremarkable Adrenals/Urinary Tract: A RIGHT ureteral stent is noted with tip and posterior UPPER RIGHT kidney and bladder. Hydronephrosis has slightly decreased. Gas in the renal  collecting system may be related from recent procedure. Increasing RIGHT perinephric inflammation/fluid identified with new inflammation in the RIGHT subcutaneous fat overlying the RIGHT kidney. Bilateral renal cortical atrophy noted with unchanged LEFT renal cyst. Gas in the bladder is likely from recent procedure. The adrenal glands are unremarkable. Stomach/Bowel: Stomach is within normal limits. No evidence of bowel wall thickening, distention, or inflammatory changes. Vascular/Lymphatic: Aortic atherosclerosis. No enlarged  abdominal or pelvic lymph nodes. Reproductive: Status post hysterectomy. No adnexal masses. Pessary again noted Other: No ascites or pneumoperitoneum. Musculoskeletal: No acute abnormality. L2 compression fracture, L3-L5 lumbar fusion changes and RIGHT total hip arthroplasty changes again noted. IMPRESSION: 1. Increasing RIGHT perinephric inflammation and fluid as well as new inflammation in the RIGHT subcutaneous fat overlying the RIGHT renal region, and may represent infection. 2. New small RIGHT pleural effusion and increasing bibasilar atelectasis. 3. RIGHT ureteral stent with decreased RIGHT hydronephrosis. Gas in the renal collecting system and bladder may be from recent procedure versus infection. Electronically Signed   By: Margarette Canada M.D.   On: 01/08/2019 11:13        Scheduled Meds:  amitriptyline  25 mg Oral QHS   carvedilol  3.125 mg Oral BID WC   diltiazem  120 mg Oral q morning - 10a   fentaNYL       flecainide  75 mg Oral BID   midazolam       pantoprazole  40 mg Oral Daily   polyethylene glycol  17 g Oral Daily   saccharomyces boulardii  250 mg Oral BID   senna-docusate  2 tablet Oral BID   Continuous Infusions:  cefTRIAXone (ROCEPHIN)  IV 2 g (01/09/19 1333)   heparin Stopped (01/10/19 0820)   lactated ringers 75 mL/hr at 01/10/19 0303   vancomycin 1,250 mg (01/09/19 1434)     LOS: 7 days     Georgette Shell, MD [facility] If 7PM-7AM, please contact night-coverage [email or web address] Password [email or web address] 01/10/2019, 10:07 AM

## 2019-01-10 NOTE — Progress Notes (Signed)
[place] for IV heparin (on Apixaban PTA) Indication: atrial fibrillation  Allergies  Allergen Reactions  . Codeine Nausea Only and Other (See Comments)    hallucinations  . Hydrocodone Nausea Only and Other (See Comments)  . Macrodantin [Nitrofurantoin] Nausea And Vomiting    Patient Measurements: Height: 5\' 7"  (170.2 cm) Weight: 194 lb 0.1 oz (88 kg) IBW/kg (Calculated) : 61.6 Heparin Dosing Weight: 80.9 kg  Vital Signs: Temp: 98.3 F (36.8 C) (03/16 0527) Temp Source: Oral (03/16 0527) BP: 135/60 (03/16 1015) Pulse Rate: 102 (03/16 1015)  Labs: Recent Labs    01/08/19 0631 01/09/19 0554 01/09/19 1659 01/10/19 0417  HGB 12.7 12.8  --  12.5  HCT 41.7 41.9  --  41.0  PLT 450* 474*  --  470*  APTT  --  35 48* 41*  HEPARINUNFRC  --  1.70* 1.07* 0.75*  CREATININE 0.74 0.63  --  0.56    Estimated Creatinine Clearance: 68.2 mL/min (by C-G formula based on SCr of 0.56 mg/dL).   Medical History: Past Medical History:  Diagnosis Date  . Abdominal aortic atherosclerosis ([place]) 05/07/2016   Noted on CT abd/pelvis  . Anticoagulant long-term use    ELIQUIS  . Atrial fibrillation with RVR ([place])   . Chronic diastolic (congestive) heart failure ([place])   . Coronary artery disease   . Cystocele, midline    followed by dr c. Marvel Plan ([facility] [place])  pt uses pessary  . Diverticulosis 05/07/2016   Noted on CT abd/pelvis  . Dyspnea    w exertion d/t Afib per pt  . First degree heart block   . GERD (gastroesophageal reflux disease)   . Hematuria    intermittently d/t JJ stent  . Hiatal hernia   . History of arteriovenostomy for renal dialysis ([place])   . History of colon polyps   . History of gastric polyp   . History of kidney stones   . History of peptic ulcer disease   . History of septic shock    2011 (pt unaware) and 06-04-2013  w/ acute renal failure  . History of uterine cancer    STAGE I  --   S/P TAH W/ BSO  (NO OTHER TX)  . History of ventilator dependency ([place]) 2014  . Hydronephrosis, right 05/07/2016   Moderate, Noted on CT abd/pelvis  . Hyperlipidemia   . Hypertension   . Iron deficiency anemia hematology/ oncology--- dr Bobby Rumpf at [facility] in [place]--- per lov note 08-07-2017  stabilized and felt to be more chronic anemia   01/ 2018  dx iron def. anemia  s/p  IV Iron infusion and taking oral iron supplement  . PAF (paroxysmal atrial fibrillation) ([place]) DX JUNE 2012   CARDIOLOGIST--  DR Agustin Cree ([facility] , [place]--- that office has changed to [facility]))  CHADS2  . Presence of pessary    for midline pessary  . Ureteral stricture, right    has JJ stent  . Wears glasses     Assessment: 15 y/oF presented to [facility] ED on 01/03/2019 with severe right flank pain. PMH significant for a-fib (on Apixaban PTA), dCHF, recurrent ureteral obstruction s/p stent replacement every 5 months. Admitted with hydronephrosis with obstructed right stent, sepsis due to complicated UTI with suspected pyelonephritis. Patient underwent cystoscopy with right stent exchange on 01/03/2019. Apixaban resumed post-operatively. Extensive abdominal cellulitis noted 01/07/19. Ultrasound showed extensive right perinephric fluid collection. CT today shows increasing right perinephric  inflammation and fluid as well as new inflammation in the right subcutaneous fat overlying the right renal region, and may represent infection. Apixaban held in case procedure needed. Pharmacy consulted to dose IV heparin while Apixaban on hold.  Today, 01/10/19:  Spoke with Dr. Pascal Lux, he said it was safe to resume IV heparin now after IR placement of perinephric drain. Note that IV heparin was turned off at 0820 in preparation for IR procedure  Last dose of Apixaban 5mg  PO BID   0925 on 3/14  Heparin level is 0.75, falsely elevated due to recent Apixaban use  aPTT still subtherapeutic at  41 sec despite increase in IV heparin rate last night  As Apixaban can falsely elevate heparin levels, will monitor and titrate IV heparin infusion via aPTT until effects of Apixaban have worn off and heparin level and aPTT values correlate  Goal of Therapy:  Heparin level 0.3-0.7 units/ml aPTT 66-102 seconds Monitor platelets by anticoagulation protocol: Yes   Plan:   Due to placement of drains by IR this AM, will not rebolus IV heparin at this time but will restart IV heparin rate at 1900 units/hr (increase from 1600 units/hr patient previously on)   aPTT and heparin level 8 hours after heparin infusion rate changed  Daily CBC, heparin level, aPTT   Monitor closely for s/sx of bleeding   Adrian Saran, PharmD, BCPS Pager [phone] 01/10/2019 11:01 AM

## 2019-01-10 NOTE — Procedures (Signed)
Pre procedural Dx: Perinephric Abscess Post procedural Dx: Same  Technically successful CT guided placed of a 10 Fr drainage catheter placement into dominant component of right sided perinephric abscess about the superior pole of the right kidney yielding 140 cc of purulent fluid.  Technically successful CT guided placed of a 10 Fr drainage catheter placement into dominant component of right sided perinephric abscess about the inferior pole of the right kidney yielding 45 cc of purulent fluid.  All aspirated samples sent to the laboratory for analysis, including for Cr level (to eval for potential urine leak).    EBL: Minimal  Complications: None immediate  Ronny Bacon, MD Pager #: [phone]

## 2019-01-11 LAB — CBC
HCT: 38.1 % (ref 36.0–46.0)
Hemoglobin: 11.6 g/dL — ABNORMAL LOW (ref 12.0–15.0)
MCH: 27.5 pg (ref 26.0–34.0)
MCHC: 30.4 g/dL (ref 30.0–36.0)
MCV: 90.3 fL (ref 80.0–100.0)
Platelets: 472 10*3/uL — ABNORMAL HIGH (ref 150–400)
RBC: 4.22 MIL/uL (ref 3.87–5.11)
RDW: 15.2 % (ref 11.5–15.5)
WBC: 19.2 10*3/uL — ABNORMAL HIGH (ref 4.0–10.5)
nRBC: 0 % (ref 0.0–0.2)

## 2019-01-11 LAB — BASIC METABOLIC PANEL
Anion gap: 7 (ref 5–15)
BUN: 10 mg/dL (ref 8–23)
CO2: 30 mmol/L (ref 22–32)
Calcium: 7.6 mg/dL — ABNORMAL LOW (ref 8.9–10.3)
Chloride: 99 mmol/L (ref 98–111)
Creatinine, Ser: 0.65 mg/dL (ref 0.44–1.00)
GFR calc Af Amer: >60 mL/min (ref >60)
GFR calc non Af Amer: >60 mL/min (ref >60)
Glucose, Bld: 88 mg/dL (ref 70–99)
Potassium: 3.1 mmol/L — ABNORMAL LOW (ref 3.5–5.1)
Sodium: 136 mmol/L (ref 135–145)

## 2019-01-11 LAB — APTT: aPTT: 130 seconds — ABNORMAL HIGH (ref 24–36)

## 2019-01-11 LAB — CREATININE, BODY FLUID OTHER: Creatinine, Body Fluid: 0.4 mg/dL

## 2019-01-11 LAB — GLUCOSE, CAPILLARY: Glucose-Capillary: 119 mg/dL — ABNORMAL HIGH (ref 70–99)

## 2019-01-11 LAB — HEPARIN LEVEL (UNFRACTIONATED): Heparin Unfractionated: 0.63 IU/mL (ref 0.30–0.70)

## 2019-01-11 MED ORDER — APIXABAN 5 MG PO TABS
5.0000 mg | ORAL_TABLET | Freq: Two times a day (BID) | ORAL | Status: DC
  Administered 2019-01-11 – 2019-01-13 (×5): 5 mg via ORAL
  Filled 2019-01-11 (×5): qty 1

## 2019-01-11 MED ORDER — OXYCODONE-ACETAMINOPHEN 5-325 MG PO TABS
1.0000 | ORAL_TABLET | Freq: Four times a day (QID) | ORAL | Status: DC | PRN
  Administered 2019-01-11 – 2019-01-12 (×3): 1 via ORAL
  Filled 2019-01-11 (×4): qty 1

## 2019-01-11 MED ORDER — ACETAMINOPHEN 325 MG PO TABS: 650.0000 mg | ORAL_TABLET | Freq: Four times a day (QID) | ORAL | Status: DC | PRN

## 2019-01-11 NOTE — Progress Notes (Signed)
Rhea for IV heparin (on Apixaban PTA) Indication: atrial fibrillation  Allergies  Allergen Reactions  . Codeine Nausea Only and Other (See Comments)    hallucinations  . Hydrocodone Nausea Only and Other (See Comments)  . Macrodantin [Nitrofurantoin] Nausea And Vomiting   Patient Measurements: Height: 5\' 7"  (170.2 cm) Weight: 194 lb 0.1 oz (88 kg) IBW/kg (Calculated) : 61.6 Heparin Dosing Weight: 80.9 kg  Vital Signs: Temp: 98 F (36.7 C) (03/16 2143) Temp Source: Oral (03/16 2143) BP: 135/59 (03/16 2143) Pulse Rate: 70 (03/16 2143)  Labs: Recent Labs    01/09/19 0554  01/10/19 0417 01/10/19 1923 01/11/19 0346  HGB 12.8  --  12.5  --  11.6*  HCT 41.9  --  41.0  --  38.1  PLT 474*  --  470*  --  472*  APTT 35   < > 41* 82* 130*  HEPARINUNFRC 1.70*   < > 0.75* 0.59 0.63  CREATININE 0.63  --  0.56  --  0.65   < > = values in this interval not displayed.   Estimated Creatinine Clearance: 68.2 mL/min (by C-G formula based on SCr of 0.65 mg/dL).  Assessment: 57 y/oF presented [facility] ED on 01/03/2019 with severe right flank pain. PMH significant for a-fib (on Apixaban PTA), dCHF, recurrent ureteral obstruction s/p stent replacement every 5 months. Admitted with hydronephrosis with obstructed right stent, sepsis due to complicated UTI with suspected pyelonephritis. Patient underwent cystoscopy with right stent exchange on 01/03/2019. Apixaban resumed post-operatively. Extensive abdominal cellulitis noted 01/07/19. Ultrasound showed extensive right perinephric fluid collection. CT today shows increasing right perinephric inflammation and fluid as well as new inflammation in the right subcutaneous fat overlying the right renal region, and may represent infection. Apixaban held in case procedure needed. Pharmacy consulted to dose IV heparin while Apixaban on hold.  Last dose of Apixaban 5mg  PO BID   0925 on 3/14   3/16:  IR placement of  perinephric drain. Note that IV heparin was turned off at 0820 in preparation for IR procedure     Today, 01/11/19  Heparin level is 0.63 this am, aPTT 130 sec, levels correlate, can not monitor using only Hep levels  As Apixaban can falsely elevate heparin levels, monitor and titrate IV heparin infusion via aPTT until effects of Apixaban have worn off and heparin level and aPTT values correlate  Goal of Therapy:  Heparin level 0.3-0.7 units/ml aPTT 66-102 seconds Monitor platelets by anticoagulation protocol: Yes   Plan:   Reduce IV heparin to 1800 units/hr  Daily CBC, heparin level  Monitor closely for s/sx of bleeding  Minda Ditto PharmD Pager [phone] 01/11/2019, 5:25 AM

## 2019-01-11 NOTE — Progress Notes (Signed)
Physical Therapy Treatment Patient Details Name: [name] [name] MRN: [id number] DOB: [birth year] Today's Date: 01/11/2019    History of Present Illness 78 y.o. female with medical history significant for paroxysmal A. fib on Eliquis, chronic diastolic CHF, recurrent ureteral obstruction post ureteral stent placement every 5 months, hyperlipidemia, GERD and admitted for moderately severe right hydronephrosis with obstructed right stent and s/p right stent replacement on 01/03/2019    PT Comments    Assisted OOB to [facility] to void then amb a limited distance in hallway.  General Gait Details: limited distance due to pain and unsteady gait due to weakness     avg RA 88%  Follow Up Recommendations  Home health PT;Supervision/Assistance - 24 hour     Equipment Recommendations  None recommended by PT    Recommendations for Other Services       Precautions / Restrictions Precautions Precautions: Fall Precaution Comments: R side drains Restrictions Weight Bearing Restrictions: No    Mobility  Bed Mobility Overal bed mobility: Needs Assistance Bed Mobility: Supine to Sit;Sit to Supine     Supine to sit: Min guard;HOB elevated Sit to supine: Min guard;HOB elevated   General bed mobility comments: increased time and effort  Transfers Overall transfer level: Needs assistance   Transfers: Sit to/from Stand              Ambulation/Gait Ambulation/Gait assistance: Min assist;Min guard Gait Distance (Feet): 35 Feet Assistive device: Rolling walker (2 wheeled) Gait Pattern/deviations: Step-to pattern;Decreased stance time - left Gait velocity: decreased    General Gait Details: limited distance due to pain and unsteady gait due to weakness     avg RA 88%   Stairs             Wheelchair Mobility    Modified Rankin (Stroke Patients Only)       Balance                                            Cognition Arousal/Alertness:  Awake/alert Behavior During Therapy: WFL for tasks assessed/performed Overall Cognitive Status: Within Functional Limits for tasks assessed                                        Exercises      General Comments        Pertinent Vitals/Pain Pain Assessment: 0-10 Pain Score: 5  Pain Location: R side  Pain Descriptors / Indicators: Sharp Pain Intervention(s): Monitored during session;Repositioned    Home Living                      Prior Function            PT Goals (current goals can now be found in the care plan section) Progress towards PT goals: Progressing toward goals    Frequency    Min 3X/week      PT Plan Current plan remains appropriate    Co-evaluation              AM-PAC PT "6 Clicks" Mobility   Outcome Measure  Help needed turning from your back to your side while in a flat bed without using bedrails?: A Little Help needed moving from lying on your back to sitting on the side of a flat  bed without using bedrails?: A Little Help needed moving to and from a bed to a chair (including a wheelchair)?: A Little Help needed standing up from a chair using your arms (e.g., wheelchair or bedside chair)?: A Little Help needed to walk in hospital room?: A Little Help needed climbing 3-5 steps with a railing? : A Lot 6 Click Score: 17    End of Session Equipment Utilized During Treatment: Gait belt Activity Tolerance: Patient limited by fatigue Patient left: in bed;with call bell/phone within reach;with bed alarm set Nurse Communication: Mobility status PT Visit Diagnosis: Other abnormalities of gait and mobility (R26.89)     Time: 6773-7366 PT Time Calculation (min) (ACUTE ONLY): 24 min  Charges:  $Gait Training: 8-22 mins $Therapeutic Activity: 8-22 mins                     Rica Koyanagi  PTA Acute  Rehabilitation Services Pager      [phone] Office      [phone]

## 2019-01-11 NOTE — Progress Notes (Signed)
PROGRESS NOTE    [name] [name]  IRS:[id number] DOB: 1941/01/18 DOA: 01/03/2019 PCP: [name], MD  Brief Narrative:78 y.o.femalewith medical history significant forparoxysmal A. fib on Eliquis, chronic diastolic CHF, recurrent ureteral obstruction post ureteral stent placement every 5 months, hyperlipidemia, GERD who presented to [facility] ED with complaints of sudden onset severe right flank pain that started earlier this morning around 4 AM. Associated with chills. Last visit with her urologist was 2-3 weeks ago. She was thentreated with Bactrim and completed her course of antibiotic for UTI.No other changes to her medications.  Inthe ED, vital signsareremarkable for tachycardia with rate of 114,WBC 17 K and urine analysis positive for pyuria. CT abdomen pelvis with contrast done on 01/03/2019 reveals moderate to moderately severe right hydronephrosis with a right double-J ureteral stent in good position and extensive stranding about the right kidney and proximal ureter worse than on the prior exam and worrisome for urinary tract infection/urethritis. ED physician consulted urology. TRH asked to admit  Taken to the OR by urology on 01/03/19 for replacement of her R ureter stent  Assessment & Plan:   Active Problems:   Hydronephrosis   Urinary tract infection without hematuria   Occlusion of ureteral stent (HCC)   Moderately severe right hydronephrosis withobstructed right stent Findings on CT abdomen and pelvis with contrast post right stent replacement on 01/03/2019 by Dr.Wrenn. Urine culture and blood culture with E. coli sensitive to Rocephin.  Patient developed right flank cellulitis with perinephric fluid collection.  She had 2 drains placed by IR 01/10/2019 in the right upper pole and the right lower pole of the kidney.  They drained 140 cc from the right upper pole and 45 cc from the right inferior pole of the kidney.  Patient significantly better with decreased pain  decreased temperature overnight.  Follow-up cultures, follow-up creatinine from the culture to see if this is extension of perinephric urinoma.   AKI, suspect post obstructivehas been resolved. Patient was on Lasix at home which is on hold at this time.  History of recurrent ureteral obstruction Has ureteral stent in place Follows with urology outpatient Dr. Tresa Moore Self-reported ureteral stentarereplaced every 5 months Urology followingdiscussed with Dr. Tresa Moore   Paroxysmal A. Fib Rate controlled with Coreg and diltiazem Continue eliquis  GERD Continue omeprazole  Chronic diastolic CHFpatient is on 2 L of oxygen. She reports she does not have oxygen at home. Her ambulatory oxygen saturation dropped so she was placed on 2 L of oxygen. Chest x-ray shows clear lungs. Encourage her to use incentive spirometry as much as possible.  DVT prophylaxis:Eliquis  Code Status:Full code as confirmed by patient herself.  Family Communication:Discussed with husband  Disposition Plan:Pending clinical improvement  Estimated body mass index is 30.39 kg/m as calculated from the following:   Height as of this encounter: 5\' 7"  (1.702 m).   Weight as of this encounter: 88 kg.   Subjective: Feels a lot better after drains placed no fever pain better   Objective: Vitals:   01/10/19 1130 01/10/19 1340 01/10/19 2143 01/11/19 0602  BP: (!) 144/66 (!) 129/56 (!) 135/59 (!) 146/65  Pulse: 93 82 70 80  Resp: 20 20 18 18   Temp: 98 F (36.7 C) 98.2 F (36.8 C) 98 F (36.7 C) 98.1 F (36.7 C)  TempSrc: Oral Oral Oral Oral  SpO2: 98% 98% 98% 96%  Weight:      Height:        Intake/Output Summary (Last 24 hours) at 01/11/2019  [place] filed at 01/11/2019 0604 Gross per 24 hour  Intake 2010.56 ml  Output 450 ml  Net 1560.56 ml   Filed Weights   01/07/19 0636 01/08/19 0500 01/10/19 0708  Weight: 90.6 kg 89.9 kg 88 kg    Examination:  General exam: Appears calm  and comfortable  Respiratory system: Clear to auscultation. Respiratory effort normal. Cardiovascular system: S1 & S2 heard, RRR. No JVD, murmurs, rubs, gallops or clicks. No pedal edema. Gastrointestinal system: Abdomen is nondistended, soft and nontender. No organomegaly or masses felt. Normal bowel sounds heard.erythema receding edema .[name] Central nervous system: Alert and oriented. No focal neurological deficits. Extremities: Symmetric 5 x 5 power. Skin: No rashes, lesions or ulcers Psychiatry: Judgement and insight appear normal. Mood & affect appropriate.     Data Reviewed: I have personally reviewed following labs and imaging studies  CBC: Recent Labs  Lab 01/07/19 1136 01/08/19 0631 01/09/19 0554 01/10/19 0417 01/11/19 0346  WBC 16.9* 16.4* 16.4* 20.3* 19.2*  HGB 12.6 12.7 12.8 12.5 11.6*  HCT 41.7 41.7 41.9 41.0 38.1  MCV 88.7 90.1 89.7 90.1 90.3  PLT 430* 450* 474* 470* 831*   Basic Metabolic Panel: Recent Labs  Lab 01/07/19 1136 01/08/19 0631 01/09/19 0554 01/10/19 0417 01/11/19 0346  NA 134* 136 137 135 136  K 3.4* 3.1* 3.4* 3.4* 3.1*  CL 100 97* 99 98 99  CO2 26 29 27 27 30   GLUCOSE 104* 84 83 74 88  BUN 17 16 12 12 10   CREATININE 0.65 0.74 0.63 0.56 0.65  CALCIUM 8.0* 8.0* 8.0* 7.7* 7.6*   GFR: Estimated Creatinine Clearance: 68.2 mL/min (by C-G formula based on SCr of 0.65 mg/dL). Liver Function Tests: Recent Labs  Lab 01/07/19 1136  AST 19  ALT 13  ALKPHOS 59  BILITOT 0.9  PROT 5.6*  ALBUMIN 2.1*   No results for input(s): LIPASE, AMYLASE in the last 168 hours. No results for input(s): AMMONIA in the last 168 hours. Coagulation Profile: No results for input(s): INR, PROTIME in the last 168 hours. Cardiac Enzymes: No results for input(s): CKTOTAL, CKMB, CKMBINDEX, TROPONINI in the last 168 hours. BNP (last 3 results) No results for input(s): PROBNP in the last 8760 hours. HbA1C: No results for input(s): HGBA1C in the last 72  hours. CBG: No results for input(s): GLUCAP in the last 168 hours. Lipid Profile: No results for input(s): CHOL, HDL, LDLCALC, TRIG, CHOLHDL, LDLDIRECT in the last 72 hours. Thyroid Function Tests: No results for input(s): TSH, T4TOTAL, FREET4, T3FREE, THYROIDAB in the last 72 hours. Anemia Panel: No results for input(s): VITAMINB12, FOLATE, FERRITIN, TIBC, IRON, RETICCTPCT in the last 72 hours. Sepsis Labs: No results for input(s): PROCALCITON, LATICACIDVEN in the last 168 hours.  Recent Results (from the past 240 hour(s))  Urine Culture     Status: Abnormal   Collection Time: 01/03/19 10:50 AM  Result Value Ref Range Status   Specimen Description   Final    URINE, CLEAN CATCH Performed at [facility], [place] [address], [place], [place] [address]    Special Requests   Final    NONE Performed at [facility], [place] [address] [place], [place] [address]    Culture >=100,000 COLONIES/mL ESCHERICHIA COLI (A)  Final   Report Status 01/06/2019 FINAL  Final   Organism ID, Bacteria ESCHERICHIA COLI (A)  Final      Susceptibility   Escherichia coli - MIC*    AMPICILLIN 16 INTERMEDIATE Intermediate  CEFAZOLIN <=4 SENSITIVE Sensitive     CEFTRIAXONE <=1 SENSITIVE Sensitive     CIPROFLOXACIN >=4 RESISTANT Resistant     GENTAMICIN 4 SENSITIVE Sensitive     IMIPENEM 0.5 SENSITIVE Sensitive     NITROFURANTOIN <=16 SENSITIVE Sensitive     TRIMETH/SULFA <=20 SENSITIVE Sensitive     AMPICILLIN/SULBACTAM 4 SENSITIVE Sensitive     PIP/TAZO 8 SENSITIVE Sensitive     Extended ESBL NEGATIVE Sensitive     * >=100,000 COLONIES/mL ESCHERICHIA COLI  Culture, blood (routine x 2)     Status: Abnormal   Collection Time: 01/03/19  3:44 PM  Result Value Ref Range Status   Specimen Description   Final    BLOOD LEFT ARM Performed at [place] [address], [place], [place] [address]    Special Requests   Final    BOTTLES DRAWN AEROBIC  ONLY Blood Culture adequate volume Performed at [place] [address], [place], [place] [address]    Culture  Setup Time   Final    GRAM NEGATIVE RODS AEROBIC BOTTLE ONLY CRITICAL RESULT CALLED TO, READ BACK BY AND VERIFIED WITH: S CHRISTY PHARMD 2025 01/04/19 A BROWNING Performed at [facility], [place] [address], [place], [place] [address]    Culture ESCHERICHIA COLI (A)  Final   Report Status 01/06/2019 FINAL  Final   Organism ID, Bacteria ESCHERICHIA COLI  Final      Susceptibility   Escherichia coli - MIC*    AMPICILLIN <=2 SENSITIVE Sensitive     CEFAZOLIN <=4 SENSITIVE Sensitive     CEFEPIME <=1 SENSITIVE Sensitive     CEFTAZIDIME <=1 SENSITIVE Sensitive     CEFTRIAXONE <=1 SENSITIVE Sensitive     CIPROFLOXACIN >=4 RESISTANT Resistant     GENTAMICIN 4 SENSITIVE Sensitive     IMIPENEM <=0.25 SENSITIVE Sensitive     TRIMETH/SULFA <=20 SENSITIVE Sensitive     AMPICILLIN/SULBACTAM <=2 SENSITIVE Sensitive     PIP/TAZO <=4 SENSITIVE Sensitive     Extended ESBL NEGATIVE Sensitive     * ESCHERICHIA COLI  Culture, blood (routine x 2)     Status: None   Collection Time: 01/03/19  3:44 PM  Result Value Ref Range Status   Specimen Description   Final    BLOOD RIGHT HAND Performed at [place] [address] [place], [place] [address]    Special Requests   Final    BOTTLES DRAWN AEROBIC ONLY Blood Culture adequate volume Performed at [place] [address] [place], [place] [address]    Culture   Final    NO GROWTH 5 DAYS Performed at [facility], [place] [address] [place], [place] [address]    Report Status 01/08/2019 FINAL  Final  Blood Culture ID Panel (Reflexed)     Status: Abnormal   Collection Time: 01/03/19  3:44 PM  Result Value Ref Range Status   Enterococcus species NOT DETECTED NOT DETECTED Final   Listeria monocytogenes NOT DETECTED NOT DETECTED Final   Staphylococcus species NOT  DETECTED NOT DETECTED Final   Staphylococcus aureus (BCID) NOT DETECTED NOT DETECTED Final   Streptococcus species NOT DETECTED NOT DETECTED Final   Streptococcus agalactiae NOT DETECTED NOT DETECTED Final   Streptococcus pneumoniae NOT DETECTED NOT DETECTED Final   Streptococcus pyogenes NOT DETECTED NOT DETECTED Final   Acinetobacter baumannii NOT DETECTED NOT DETECTED Final   Enterobacteriaceae species DETECTED (A) NOT DETECTED Final  Comment: Enterobacteriaceae represent a large family of gram-negative bacteria, not a single organism. CRITICAL RESULT CALLED TO, READ BACK BY AND VERIFIED WITH: S CHRISTY PHARMD 2025 01/04/19 A BROWNING    Enterobacter cloacae complex NOT DETECTED NOT DETECTED Final   Escherichia coli DETECTED (A) NOT DETECTED Final    Comment: CRITICAL RESULT CALLED TO, READ BACK BY AND VERIFIED WITH: S CHRISTY PHARMD 2025 01/04/19 A BROWNING    Klebsiella oxytoca NOT DETECTED NOT DETECTED Final   Klebsiella pneumoniae NOT DETECTED NOT DETECTED Final   Proteus species NOT DETECTED NOT DETECTED Final   Serratia marcescens NOT DETECTED NOT DETECTED Final   Carbapenem resistance NOT DETECTED NOT DETECTED Final   Haemophilus influenzae NOT DETECTED NOT DETECTED Final   Neisseria meningitidis NOT DETECTED NOT DETECTED Final   Pseudomonas aeruginosa NOT DETECTED NOT DETECTED Final   Candida albicans NOT DETECTED NOT DETECTED Final   Candida glabrata NOT DETECTED NOT DETECTED Final   Candida krusei NOT DETECTED NOT DETECTED Final   Candida parapsilosis NOT DETECTED NOT DETECTED Final   Candida tropicalis NOT DETECTED NOT DETECTED Final    Comment: Performed at [facility], [place]. [address], [place], [place] [address]  Culture, Urine     Status: Abnormal   Collection Time: 01/03/19  5:53 PM  Result Value Ref Range Status   Specimen Description   Final    CYSTOSCOPY RIGHT KIDNEY Performed at [place] [address] [place], [place]  [address]    Special Requests   Final    NONE Performed at [facility], [place] [address] [place], [place] [address]    Culture >=100,000 COLONIES/mL ESCHERICHIA COLI (A)  Final   Report Status 01/06/2019 FINAL  Final   Organism ID, Bacteria ESCHERICHIA COLI (A)  Final      Susceptibility   Escherichia coli - MIC*    AMPICILLIN 16 INTERMEDIATE Intermediate     CEFAZOLIN <=4 SENSITIVE Sensitive     CEFEPIME <=1 SENSITIVE Sensitive     CEFTAZIDIME <=1 SENSITIVE Sensitive     CEFTRIAXONE <=1 SENSITIVE Sensitive     CIPROFLOXACIN >=4 RESISTANT Resistant     GENTAMICIN <=1 SENSITIVE Sensitive     IMIPENEM <=0.25 SENSITIVE Sensitive     TRIMETH/SULFA <=20 SENSITIVE Sensitive     AMPICILLIN/SULBACTAM 4 SENSITIVE Sensitive     PIP/TAZO <=4 SENSITIVE Sensitive     Extended ESBL NEGATIVE Sensitive     * >=100,000 COLONIES/mL ESCHERICHIA COLI  Aerobic/Anaerobic Culture (surgical/deep wound)     Status: None (Preliminary result)   Collection Time: 01/10/19 11:03 AM  Result Value Ref Range Status   Specimen Description   Final    ABSCESS PERINEPHRIC DRAIN Performed at [facility], [address], [place], [place] [address]    Special Requests   Final    Normal Performed at [place] [address] [place], [place] [address]    Gram Stain   Final    ABUNDANT WBC PRESENT, PREDOMINANTLY PMN MODERATE GRAM POSITIVE COCCI FEW GRAM POSITIVE RODS RARE GRAM NEGATIVE RODS Performed at [facility], [place] [address], [place], [place] [address]    Culture PENDING  Incomplete   Report Status PENDING  Incomplete         Radiology Studies: Ct Abdomen Wo Contrast  Result Date: 01/10/2019 CLINICAL DATA:  Evaluate perinephric fluid collection. EXAM: CT ABDOMEN WITHOUT CONTRAST TECHNIQUE: Multidetector CT imaging of the abdomen was performed following the standard  protocol without IV contrast. COMPARISON:  CT abdomen pelvis-12/29/2018; 01/03/2019;  05/07/2016; nuclear medicine renal scintigraphy-06/09/2016 FINDINGS: Lower chest: Limited visualization of the lower thorax demonstrates a small right-sided pleural effusion associated bibasilar heterogeneous/consolidative opacities, right greater than left. Normal heart size.  No pericardial effusion. Hepatobiliary: There is mild nodularity hepatic contour. Post cholecystectomy. Pancreas: Normal noncontrast appearance of the pancreas. Spleen: Normal noncontrast appearance of the spleen. Adrenals/Urinary Tract: Redemonstrated right-sided double-J ureteral stent with superior coil within the right renal collecting system with associated presumably postoperative air within the right renal collecting system. The amount of ill-defined perinephric fluid is unchanged to very slightly progressed compared to the 01/08/2019 examination. Dominant component about the posterosuperior aspect of the right kidney measures approximately 11.8 x 8.5 cm (image 34, series 2) while additional component about the caudal aspect of the right kidney measures approximately 5.8 x 5.2 cm (image 53, series 2), previously, 11.2 x 9.5 cm and 5.1 x 4.7 cm respectively. High-density material within the right renal collecting system may represent residual excreted contrast. There is no definitive high-density material within this fluid collection to be diagnostic of a urine leak. Unchanged appearance of the approximately 5.4 x 5.0 cm previously characterized cyst arising from the posterosuperior aspect the left kidney (image 31, series 2). Normal noncontrast appearance of the bilateral adrenal glands. Stomach/Bowel: No evidence of enteric obstruction. No pneumoperitoneum, pneumatosis or portal venous gas. Vascular/Lymphatic: Atherosclerotic plaque within a mildly tortuous but normal caliber abdominal aorta. Scattered retroperitoneal lymph nodes are unchanged with index retrocaval lymph node measuring 1 cm in greatest short axis diameter (image 34,  series 2 and index aortocaval lymph node measuring 0.9 cm, presumably reactive in etiology. Other: Redemonstrated extensive asymmetric soft tissue stranding about the right flank with associated apparent skin thickening compatible with known cellulitis. Musculoskeletal: Post L3-L5 paraspinal fusion and intervertebral disc space replacement. Unchanged moderate (approximately 40%) compression deformity involving the superior endplate of L1. Moderate to severe DDD of L2-L3 with disc space height loss, endplate irregularity and sclerosis. Stigmata of DISH within the lower thoracic spine. IMPRESSION: 1. No change to slight increase in size ill-defined right-sided perinephric fluid. There is no definitive excreted contrast within the ill-defined perinephric fluid collection to provide definitive evidence of a urine leak. No new definable/drainable fluid collections within the abdomen on this noncontrast examination. 2. Unchanged asymmetric right-sided flank subcutaneous edema and skin thickening compatible with known cellulitis. 3.  Aortic Atherosclerosis (ICD10-I70.0). Electronically Signed   By: Sandi Mariscal M.D.   On: 01/10/2019 08:51   Ct Image Guided Fluid Drain By Catheter  Result Date: 01/10/2019 INDICATION: History of chronic right-sided double-J ureteral stent with recent difficulty exchange now with postprocedural abdominal pain and fevers with CT scan demonstrating complex right-sided perinephric fluid collection and overlying cellulitis Request made for CT-guided aspiration(s) and/or drainage catheter(s) placement for infection source control purposes. EXAM: CT-GUIDED RIGHT-SIDED PERINEPHRIC RENAL ABSCESS DRAINAGE CATHETER PLACEMENT X2 COMPARISON:  CT abdomen pelvis-01/09/2019; 01/08/2019 MEDICATIONS: The patient is currently admitted to the hospital and receiving intravenous antibiotics. The antibiotics were administered within an appropriate time frame prior to the initiation of the procedure.  ANESTHESIA/SEDATION: The patient's level of consciousness and vital signs were monitored continuously by radiology nursing throughout the procedure under my direct supervision. Fentanyl 100 mcg IV; Versed 2 mg IV Moderate Sedation Time:  25 minutes CONTRAST:  None COMPLICATIONS: None immediate. PROCEDURE: Informed written consent was obtained from the patient after a discussion of the risks, benefits and alternatives to treatment. The patient was  placed prone on the CT gantry and a pre procedural CT was performed re-demonstrating the known abscess/fluid collection within the right perinephric space with dominant component about the superior aspect of the right kidney measuring at least 10.9 x 9.3 cm (image 36, series 2 and dominant component about the caudal aspect of the right kidney measuring approximately 7.0 x 5.1 cm (image 65, series 2). The procedure was planned. A timeout was performed prior to the initiation of the procedure. The skin overlying the right mid back and flank was prepped and draped in the usual sterile fashion. The overlying soft tissues were anesthetized with 1% lidocaine with epinephrine. Appropriate trajectory towards both the cranial and caudal component of the perirenal fluid collection was planned with the use of a 22 gauge spinal needles. 18 gauge trocar needles were advanced into the abscess/fluid collections and a short Amplatz super stiff wire was coiled within both collections. Appropriate positioning was confirmed with a limited CT scan. The tract was serially dilated allowing placement of a 10 [place] all-purpose drainage catheters. Appropriate positioning was confirmed with a limited postprocedural CT scan. Approximately 140 cc of purulent fluid was aspirated from the dominant cranial component of the collection and additional 45 cc of purulent fluid was aspirated from the caudal component of the perinephric fluid collection. Both catheters were connected to JP bulbs and sutured in  place. A dressing was placed. The patient tolerated the procedure well without immediate post procedural complication. IMPRESSION: 1. Successful CT guided placement of a 10 [place] all purpose drain catheter into the dominant cranial component of the right-sided perinephric abscess with aspiration of 140 mL of purulent fluid. 2. Successful CT-guided placement of a 10 French all-purpose drainage catheter into the dominant caudal component of the right-sided perinephric abscess with aspiration of 45 cc of purulent fluid. 3. Representative sample from the aspirated fluid was capped and sent to the laboratory for analysis. Electronically Signed   By: Sandi Mariscal M.D.   On: 01/10/2019 12:10   Ct Image Guided Drainage Percut Cath  Peritoneal Retroperit  Result Date: 01/10/2019 INDICATION: History of chronic right-sided double-J ureteral stent with recent difficulty exchange now with postprocedural abdominal pain and fevers with CT scan demonstrating complex right-sided perinephric fluid collection and overlying cellulitis Request made for CT-guided aspiration(s) and/or drainage catheter(s) placement for infection source control purposes. EXAM: CT-GUIDED RIGHT-SIDED PERINEPHRIC RENAL ABSCESS DRAINAGE CATHETER PLACEMENT X2 COMPARISON:  CT abdomen pelvis-01/09/2019; 01/08/2019 MEDICATIONS: The patient is currently admitted to the hospital and receiving intravenous antibiotics. The antibiotics were administered within an appropriate time frame prior to the initiation of the procedure. ANESTHESIA/SEDATION: The patient's level of consciousness and vital signs were monitored continuously by radiology nursing throughout the procedure under my direct supervision. Fentanyl 100 mcg IV; Versed 2 mg IV Moderate Sedation Time:  25 minutes CONTRAST:  None COMPLICATIONS: None immediate. PROCEDURE: Informed written consent was obtained from the patient after a discussion of the risks, benefits and alternatives to treatment. The patient  was placed prone on the CT gantry and a pre procedural CT was performed re-demonstrating the known abscess/fluid collection within the right perinephric space with dominant component about the superior aspect of the right kidney measuring at least 10.9 x 9.3 cm (image 36, series 2 and dominant component about the caudal aspect of the right kidney measuring approximately 7.0 x 5.1 cm (image 65, series 2). The procedure was planned. A timeout was performed prior to the initiation of the procedure. The skin overlying the right mid  back and flank was prepped and draped in the usual sterile fashion. The overlying soft tissues were anesthetized with 1% lidocaine with epinephrine. Appropriate trajectory towards both the cranial and caudal component of the perirenal fluid collection was planned with the use of a 22 gauge spinal needles. 18 gauge trocar needles were advanced into the abscess/fluid collections and a short Amplatz super stiff wire was coiled within both collections. Appropriate positioning was confirmed with a limited CT scan. The tract was serially dilated allowing placement of a 10 [place] all-purpose drainage catheters. Appropriate positioning was confirmed with a limited postprocedural CT scan. Approximately 140 cc of purulent fluid was aspirated from the dominant cranial component of the collection and additional 45 cc of purulent fluid was aspirated from the caudal component of the perinephric fluid collection. Both catheters were connected to JP bulbs and sutured in place. A dressing was placed. The patient tolerated the procedure well without immediate post procedural complication. IMPRESSION: 1. Successful CT guided placement of a 10 [place] all purpose drain catheter into the dominant cranial component of the right-sided perinephric abscess with aspiration of 140 mL of purulent fluid. 2. Successful CT-guided placement of a 10 French all-purpose drainage catheter into the dominant caudal component of  the right-sided perinephric abscess with aspiration of 45 cc of purulent fluid. 3. Representative sample from the aspirated fluid was capped and sent to the laboratory for analysis. Electronically Signed   By: Sandi Mariscal M.D.   On: 01/10/2019 12:10        Scheduled Meds:  amitriptyline  25 mg Oral QHS   apixaban  5 mg Oral BID   carvedilol  3.125 mg Oral BID WC   diltiazem  120 mg Oral q morning - 10a   flecainide  75 mg Oral BID   pantoprazole  40 mg Oral Daily   polyethylene glycol  17 g Oral Daily   saccharomyces boulardii  250 mg Oral BID   senna-docusate  2 tablet Oral BID   sodium chloride flush  5 mL Intracatheter Q8H   Continuous Infusions:  cefTRIAXone (ROCEPHIN)  IV Stopped (01/10/19 1313)   lactated ringers 75 mL/hr at 01/11/19 0443     LOS: 8 days     Georgette Shell, MD [facility]  If 7PM-7AM, please contact night-coverage [email or web address] Password [facility] 01/11/2019, 11:34 AM

## 2019-01-11 NOTE — Progress Notes (Signed)
Referring Physician(s): Manny,T  Supervising Physician: Markus Daft  Patient Status:  [facility] - In-pt  Chief Complaint: right flank pain/perinephric fluid collections   Subjective: Pt feeling better since abd drains placed yesterday; denies N/V   Allergies: Codeine; Hydrocodone; and Macrodantin [nitrofurantoin]  Medications: Prior to Admission medications   Medication Sig Start Date End Date Taking? Authorizing Provider  acetaminophen (TYLENOL) 500 MG tablet Take 1,000 mg by mouth 2 (two) times daily as needed for moderate pain or headache.   Yes [provider]  amitriptyline (ELAVIL) 25 MG tablet Take 25 mg by mouth at bedtime.   Yes [provider]  carvedilol (COREG) 6.25 MG tablet TAKE 1/2 TABLET BY MOUTH TWICE DAILY 02/22/18  Yes Park Liter, MD  diltiazem (CARDIZEM CD) 120 MG 24 hr capsule Take 1 capsule (120 mg total) by mouth every morning. Patient taking differently: Take 120 mg by mouth every morning.  07/28/17  Yes Park Liter, MD  ELIQUIS 5 MG TABS tablet Take 1 tablet (5 mg total) by mouth 2 (two) times daily. Patient taking differently: Take 5 mg by mouth 2 (two) times daily.  12/29/18  Yes Park Liter, MD  ferrous sulfate 325 (65 FE) MG EC tablet Take 325 mg by mouth 2 (two) times daily.    Yes [provider]  flecainide (TAMBOCOR) 50 MG tablet Take 1.5 tablets (75 mg total) by mouth 2 (two) times daily. 11/08/18  Yes Park Liter, MD  ketoconazole (NIZORAL) 2 % shampoo Apply 1 application topically daily as needed for rash. 10/26/18  Yes [provider]  omeprazole (PRILOSEC) 40 MG capsule Take 40 mg by mouth every morning.    Yes [provider]  ondansetron (ZOFRAN-ODT) 4 MG disintegrating tablet Take 4 mg by mouth every 6 (six) hours as needed for nausea/vomiting. 11/16/18  Yes [provider]  traMADol (ULTRAM) 50 MG tablet Take 50 mg by mouth every 6 (six) hours as needed. 11/16/18   Yes [provider]  triamcinolone cream (KENALOG) 0.1 % Apply 1 application topically 2 (two) times daily as needed for rash. 11/13/18  Yes [provider]  clotrimazole-betamethasone (LOTRISONE) cream Apply 1 application topically 2 (two) times daily as needed for rash. 11/17/18   [provider]  furosemide (LASIX) 40 MG tablet Take 40 mg by mouth daily as needed for fluid.     [provider]  potassium chloride (K-DUR) 10 MEQ tablet Take 1 tablet (10 mEq total) by mouth as needed. When you take Lasix. 08/04/17 10/06/19  Park Liter, MD     Vital Signs: BP (!) 146/65 (BP Location: Right Arm)    Pulse 80    Temp 98.1 F (36.7 C) (Oral)    Resp 18    Ht 5\' 7"  (1.702 m)    Wt 194 lb 0.1 oz (88 kg)    SpO2 96%    BMI 30.39 kg/m   Physical Exam rt abd drains intact, insertion sites ok, not sig tender, outputs 155/430 cc brown fluid  Imaging: Ct Abdomen Wo Contrast  Result Date: 01/10/2019 CLINICAL DATA:  Evaluate perinephric fluid collection. EXAM: CT ABDOMEN WITHOUT CONTRAST TECHNIQUE: Multidetector CT imaging of the abdomen was performed following the standard protocol without IV contrast. COMPARISON:  CT abdomen pelvis-12/29/2018; 01/03/2019; 05/07/2016; nuclear medicine renal scintigraphy-06/09/2016 FINDINGS: Lower chest: Limited visualization of the lower thorax demonstrates a small right-sided pleural effusion associated bibasilar heterogeneous/consolidative opacities, right greater than left. Normal heart size.  No  pericardial effusion. Hepatobiliary: There is mild nodularity hepatic contour. Post cholecystectomy. Pancreas: Normal noncontrast appearance of the pancreas. Spleen: Normal noncontrast appearance of the spleen. Adrenals/Urinary Tract: Redemonstrated right-sided double-J ureteral stent with superior coil within the right renal collecting system with associated presumably postoperative air within the right renal collecting system. The amount  of ill-defined perinephric fluid is unchanged to very slightly progressed compared to the 01/08/2019 examination. Dominant component about the posterosuperior aspect of the right kidney measures approximately 11.8 x 8.5 cm (image 34, series 2) while additional component about the caudal aspect of the right kidney measures approximately 5.8 x 5.2 cm (image 53, series 2), previously, 11.2 x 9.5 cm and 5.1 x 4.7 cm respectively. High-density material within the right renal collecting system may represent residual excreted contrast. There is no definitive high-density material within this fluid collection to be diagnostic of a urine leak. Unchanged appearance of the approximately 5.4 x 5.0 cm previously characterized cyst arising from the posterosuperior aspect the left kidney (image 31, series 2). Normal noncontrast appearance of the bilateral adrenal glands. Stomach/Bowel: No evidence of enteric obstruction. No pneumoperitoneum, pneumatosis or portal venous gas. Vascular/Lymphatic: Atherosclerotic plaque within a mildly tortuous but normal caliber abdominal aorta. Scattered retroperitoneal lymph nodes are unchanged with index retrocaval lymph node measuring 1 cm in greatest short axis diameter (image 34, series 2 and index aortocaval lymph node measuring 0.9 cm, presumably reactive in etiology. Other: Redemonstrated extensive asymmetric soft tissue stranding about the right flank with associated apparent skin thickening compatible with known cellulitis. Musculoskeletal: Post L3-L5 paraspinal fusion and intervertebral disc space replacement. Unchanged moderate (approximately 40%) compression deformity involving the superior endplate of L1. Moderate to severe DDD of L2-L3 with disc space height loss, endplate irregularity and sclerosis. Stigmata of DISH within the lower thoracic spine. IMPRESSION: 1. No change to slight increase in size ill-defined right-sided perinephric fluid. There is no definitive excreted contrast  within the ill-defined perinephric fluid collection to provide definitive evidence of a urine leak. No new definable/drainable fluid collections within the abdomen on this noncontrast examination. 2. Unchanged asymmetric right-sided flank subcutaneous edema and skin thickening compatible with known cellulitis. 3.  Aortic Atherosclerosis (ICD10-I70.0). Electronically Signed   By: Sandi Mariscal M.D.   On: 01/10/2019 08:51   Ct Abdomen Pelvis W Contrast  Result Date: 01/08/2019 CLINICAL DATA:  78 year old female with acute RIGHT flank pain following stent placement on 01/03/2019. RIGHT flank erythema. EXAM: CT ABDOMEN AND PELVIS WITH CONTRAST TECHNIQUE: Multidetector CT imaging of the abdomen and pelvis was performed using the standard protocol following bolus administration of intravenous contrast. CONTRAST:  161mL OMNIPAQUE IOHEXOL 300 MG/ML  SOLN COMPARISON:  01/07/2019 ultrasound and 01/03/2019 CT FINDINGS: Lower chest: New small RIGHT pleural effusion noted with increasing bibasilar atelectasis. Cardiomegaly again noted. Hepatobiliary: No hepatic abnormalities. The patient is status post cholecystectomy. No biliary dilatation. Pancreas: Unremarkable Spleen: Unremarkable Adrenals/Urinary Tract: A RIGHT ureteral stent is noted with tip and posterior UPPER RIGHT kidney and bladder. Hydronephrosis has slightly decreased. Gas in the renal collecting system may be related from recent procedure. Increasing RIGHT perinephric inflammation/fluid identified with new inflammation in the RIGHT subcutaneous fat overlying the RIGHT kidney. Bilateral renal cortical atrophy noted with unchanged LEFT renal cyst. Gas in the bladder is likely from recent procedure. The adrenal glands are unremarkable. Stomach/Bowel: Stomach is within normal limits. No evidence of bowel wall thickening, distention, or inflammatory changes. Vascular/Lymphatic: Aortic atherosclerosis. No enlarged abdominal or pelvic lymph nodes. Reproductive: Status  post hysterectomy. No  adnexal masses. Pessary again noted Other: No ascites or pneumoperitoneum. Musculoskeletal: No acute abnormality. L2 compression fracture, L3-L5 lumbar fusion changes and RIGHT total hip arthroplasty changes again noted. IMPRESSION: 1. Increasing RIGHT perinephric inflammation and fluid as well as new inflammation in the RIGHT subcutaneous fat overlying the RIGHT renal region, and may represent infection. 2. New small RIGHT pleural effusion and increasing bibasilar atelectasis. 3. RIGHT ureteral stent with decreased RIGHT hydronephrosis. Gas in the renal collecting system and bladder may be from recent procedure versus infection. Electronically Signed   By: Margarette Canada M.D.   On: 01/08/2019 11:13   US Renal  Result Date: 01/07/2019 CLINICAL DATA:  Right flank pain.  Right ureteral stent EXAM: RENAL / URINARY TRACT ULTRASOUND COMPLETE COMPARISON:  Abdomen pelvis CT dated 01/03/2019. FINDINGS: Right Kidney: Renal measurements: 13.5 x 6.7 x 6.0 cm = volume: 283 mL . Echogenicity within normal limits. No mass or hydronephrosis visualized. Extensive perinephric fluid. Left Kidney: Renal measurements: 13.6 x 6.5 x 6.1 cm = volume: 285 mL. Echogenicity within normal limits. 5.5 cm upper pole cyst. No mass or hydronephrosis visualized. Bladder: Not distended with a Foley catheter in place. IMPRESSION: 1. Extensive right perinephric fluid with no visible residual hydronephrosis. 2. 5.5 cm simple left renal cyst. Electronically Signed   By: Claudie Revering M.D.   On: 01/07/2019 12:43   Ct Image Guided Fluid Drain By Catheter  Result Date: 01/10/2019 INDICATION: History of chronic right-sided double-J ureteral stent with recent difficulty exchange now with postprocedural abdominal pain and fevers with CT scan demonstrating complex right-sided perinephric fluid collection and overlying cellulitis Request made for CT-guided aspiration(s) and/or drainage catheter(s) placement for infection source  control purposes. EXAM: CT-GUIDED RIGHT-SIDED PERINEPHRIC RENAL ABSCESS DRAINAGE CATHETER PLACEMENT X2 COMPARISON:  CT abdomen pelvis-01/09/2019; 01/08/2019 MEDICATIONS: The patient is currently admitted to the hospital and receiving intravenous antibiotics. The antibiotics were administered within an appropriate time frame prior to the initiation of the procedure. ANESTHESIA/SEDATION: The patient's level of consciousness and vital signs were monitored continuously by radiology nursing throughout the procedure under my direct supervision. Fentanyl 100 mcg IV; Versed 2 mg IV Moderate Sedation Time:  25 minutes CONTRAST:  None COMPLICATIONS: None immediate. PROCEDURE: Informed written consent was obtained from the patient after a discussion of the risks, benefits and alternatives to treatment. The patient was placed prone on the CT gantry and a pre procedural CT was performed re-demonstrating the known abscess/fluid collection within the right perinephric space with dominant component about the superior aspect of the right kidney measuring at least 10.9 x 9.3 cm (image 36, series 2 and dominant component about the caudal aspect of the right kidney measuring approximately 7.0 x 5.1 cm (image 65, series 2). The procedure was planned. A timeout was performed prior to the initiation of the procedure. The skin overlying the right mid back and flank was prepped and draped in the usual sterile fashion. The overlying soft tissues were anesthetized with 1% lidocaine with epinephrine. Appropriate trajectory towards both the cranial and caudal component of the perirenal fluid collection was planned with the use of a 22 gauge spinal needles. 18 gauge trocar needles were advanced into the abscess/fluid collections and a short Amplatz super stiff wire was coiled within both collections. Appropriate positioning was confirmed with a limited CT scan. The tract was serially dilated allowing placement of a 10 [place] all-purpose drainage  catheters. Appropriate positioning was confirmed with a limited postprocedural CT scan. Approximately 140 cc of purulent fluid was aspirated  from the dominant cranial component of the collection and additional 45 cc of purulent fluid was aspirated from the caudal component of the perinephric fluid collection. Both catheters were connected to JP bulbs and sutured in place. A dressing was placed. The patient tolerated the procedure well without immediate post procedural complication. IMPRESSION: 1. Successful CT guided placement of a 10 [place] all purpose drain catheter into the dominant cranial component of the right-sided perinephric abscess with aspiration of 140 mL of purulent fluid. 2. Successful CT-guided placement of a 10 [place] all-purpose drainage catheter into the dominant caudal component of the right-sided perinephric abscess with aspiration of 45 cc of purulent fluid. 3. Representative sample from the aspirated fluid was capped and sent to the laboratory for analysis. Electronically Signed   By: Sandi Mariscal M.D.   On: 01/10/2019 12:10   Ct Image Guided Drainage Percut Cath  Peritoneal Retroperit  Result Date: 01/10/2019 INDICATION: History of chronic right-sided double-J ureteral stent with recent difficulty exchange now with postprocedural abdominal pain and fevers with CT scan demonstrating complex right-sided perinephric fluid collection and overlying cellulitis Request made for CT-guided aspiration(s) and/or drainage catheter(s) placement for infection source control purposes. EXAM: CT-GUIDED RIGHT-SIDED PERINEPHRIC RENAL ABSCESS DRAINAGE CATHETER PLACEMENT X2 COMPARISON:  CT abdomen pelvis-01/09/2019; 01/08/2019 MEDICATIONS: The patient is currently admitted to the hospital and receiving intravenous antibiotics. The antibiotics were administered within an appropriate time frame prior to the initiation of the procedure. ANESTHESIA/SEDATION: The patient's level of consciousness and vital signs were  monitored continuously by radiology nursing throughout the procedure under my direct supervision. Fentanyl 100 mcg IV; Versed 2 mg IV Moderate Sedation Time:  25 minutes CONTRAST:  None COMPLICATIONS: None immediate. PROCEDURE: Informed written consent was obtained from the patient after a discussion of the risks, benefits and alternatives to treatment. The patient was placed prone on the CT gantry and a pre procedural CT was performed re-demonstrating the known abscess/fluid collection within the right perinephric space with dominant component about the superior aspect of the right kidney measuring at least 10.9 x 9.3 cm (image 36, series 2 and dominant component about the caudal aspect of the right kidney measuring approximately 7.0 x 5.1 cm (image 65, series 2). The procedure was planned. A timeout was performed prior to the initiation of the procedure. The skin overlying the right mid back and flank was prepped and draped in the usual sterile fashion. The overlying soft tissues were anesthetized with 1% lidocaine with epinephrine. Appropriate trajectory towards both the cranial and caudal component of the perirenal fluid collection was planned with the use of a 22 gauge spinal needles. 18 gauge trocar needles were advanced into the abscess/fluid collections and a short Amplatz super stiff wire was coiled within both collections. Appropriate positioning was confirmed with a limited CT scan. The tract was serially dilated allowing placement of a 10 [place] all-purpose drainage catheters. Appropriate positioning was confirmed with a limited postprocedural CT scan. Approximately 140 cc of purulent fluid was aspirated from the dominant cranial component of the collection and additional 45 cc of purulent fluid was aspirated from the caudal component of the perinephric fluid collection. Both catheters were connected to JP bulbs and sutured in place. A dressing was placed. The patient tolerated the procedure well without  immediate post procedural complication. IMPRESSION: 1. Successful CT guided placement of a 10 [place] all purpose drain catheter into the dominant cranial component of the right-sided perinephric abscess with aspiration of 140 mL of purulent fluid. 2. Successful CT-guided  placement of a 10 [place] all-purpose drainage catheter into the dominant caudal component of the right-sided perinephric abscess with aspiration of 45 cc of purulent fluid. 3. Representative sample from the aspirated fluid was capped and sent to the laboratory for analysis. Electronically Signed   By: Sandi Mariscal M.D.   On: 01/10/2019 12:10    Labs:  CBC: Recent Labs    01/08/19 0631 01/09/19 0554 01/10/19 0417 01/11/19 0346  WBC 16.4* 16.4* 20.3* 19.2*  HGB 12.7 12.8 12.5 11.6*  HCT 41.7 41.9 41.0 38.1  PLT 450* 474* 470* 472*    COAGS: Recent Labs    01/09/19 1659 01/10/19 0417 01/10/19 1923 01/11/19 0346  APTT 48* 41* 82* 130*    BMP: Recent Labs    01/08/19 0631 01/09/19 0554 01/10/19 0417 01/11/19 0346  NA 136 137 135 136  K 3.1* 3.4* 3.4* 3.1*  CL 97* 99 98 99  CO2 29 27 27 30   GLUCOSE 84 83 74 88  BUN 16 12 12 10   CALCIUM 8.0* 8.0* 7.7* 7.6*  CREATININE 0.74 0.63 0.56 0.65  GFRNONAA >60 >60 >60 >60  GFRAA >60 >60 >60 >60    LIVER FUNCTION TESTS: Recent Labs    01/03/19 1050 01/07/19 1136  BILITOT 1.2 0.9  AST 23 19  ALT 13 13  ALKPHOS 79 59  PROT 7.5 5.6*  ALBUMIN 3.7 2.1*    Assessment and Plan: Pt with hx chronic rt JJ stent with recent difficult exchange, subsequent development of rt flank cellulitis/rt perinephric fluid collections, s/p drain placements 3/16; afebrile; WBC 19.2(20.3), creat nl; fluid cx pend; fluid creat level pend; cont current tx; once output from drains minimal obtain f/u CT; other plans per urology/IM   Electronically Signed: D. Rowe Robert, PA-C 01/11/2019, 10:52 AM   I spent a total of 15 minutes at the the patient's bedside AND on the patient's  hospital floor or unit, greater than 50% of which was counseling/coordinating care for right perinephric fluid collection drains    Patient ID: [name] [name], female   DOB: 1941-02-10, 78 y.o.   MRN: [id number]

## 2019-01-11 NOTE — Progress Notes (Signed)
Subjective/Chief Complaint:    1 - Rt Chronic Hydronephrosis / Partial Ureteral Stricture- Multiple prior stone procedures on right including SWL, ureteroscopy, and neph tube.    Recent Course:  02/2018 - Exchange Rt 8x24 polaris stent; 08/2018 Exchange Rt 8x24 polaris stent 01/03/2019 - Exchange Rt 8x24 polaris stent as some hydro / stranding concerning for possible stent occlusion.    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 [place], [place] 01/03/19 - e.coli  Res Cipro, but sens others. Now on rocephin + VAnc.  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 Upper and lower IR perinephric drains placed 3/16. Fluid Cr 0.4 (same as serum, rule out urine leak), Fluid CX GPC/GNR/pending.   Today [name] [name] is stable. Perinephric fluid Cr low and rules out urine leak.   Objective: Vital signs in last 24 hours: Temp:  [98 F (36.7 C)-98.5 F (36.9 C)] 98.5 F (36.9 C) (03/17 1335) Pulse Rate:  [70-80] 75 (03/17 1335) Resp:  [16-18] 16 (03/17 1335) BP: (125-146)/(59-65) 125/62 (03/17 1335) SpO2:  [96 %-98 %] 98 % (03/17 1335) Last BM Date: 01/11/19  Intake/Output from previous day: 03/16 0701 - 03/17 0700 In: 2010.6 [I.V.:1880.6; IV Piggyback:100] Out: 785 [Urine:200; Drains:585] Intake/Output this shift: Total I/O In: 3 [I.V.:3] Out: 300 [Drains:300]   General appearance: alert, cooperative and at baseline, very pleasant Eyes: negative Nose: Nares normal. Septum midline. Mucosa normal. No drainage or sinus tenderness. Throat: lips, mucosa, and tongue normal; teeth and gums normal Neck: supple, symmetrical, trachea midline Back: symmetric, no curvature. ROM normal. Rt flank now with upper and lower perinephric drain in placed with foul fluid. Resp: non-labored on room  air.  Cardio: mild tachycardia.  Extremities: extremities normal, atraumatic, no cyanosis or edema Lymph nodes: Cervical, supraclavicular, and axillary nodes normal.  Lab Results:  Recent Labs    01/10/19 0417 01/11/19 0346  WBC 20.3* 19.2*  HGB 12.5 11.6*  HCT 41.0 38.1  PLT 470* 472*   BMET Recent Labs    01/10/19 0417 01/11/19 0346  NA 135 136  K 3.4* 3.1*  CL 98 99  CO2 27 30  GLUCOSE 74 88  BUN 12 10  CREATININE 0.56 0.65  CALCIUM 7.7* 7.6*   PT/INR No results for input(s): LABPROT, INR in the last 72 hours. ABG No results for input(s): PHART, HCO3 in the last 72 hours.  Invalid input(s): PCO2, PO2  Studies/Results: Ct Abdomen Wo Contrast  Result Date: 01/10/2019 CLINICAL DATA:  Evaluate perinephric fluid collection. EXAM: CT ABDOMEN WITHOUT CONTRAST TECHNIQUE: Multidetector CT imaging of the abdomen was performed following the standard protocol without IV contrast. COMPARISON:  CT abdomen pelvis-12/29/2018; 01/03/2019; 05/07/2016; nuclear medicine renal scintigraphy-06/09/2016 FINDINGS: Lower chest: Limited visualization of the lower thorax demonstrates a small right-sided pleural effusion associated bibasilar heterogeneous/consolidative opacities, right greater than left. Normal heart size.  No pericardial effusion. Hepatobiliary: There is mild nodularity hepatic contour. Post cholecystectomy. Pancreas: Normal noncontrast appearance of the pancreas. Spleen: Normal noncontrast appearance of the spleen. Adrenals/Urinary Tract: Redemonstrated right-sided double-J ureteral stent with superior coil within the right renal collecting system with associated presumably postoperative air within the right renal collecting system. The amount of ill-defined perinephric fluid is unchanged to very slightly progressed compared to the 01/08/2019 examination. Dominant component about the posterosuperior aspect of the right kidney measures approximately 11.8 x  8.5 cm (image 34, series 2)  while additional component about the caudal aspect of the right kidney measures approximately 5.8 x 5.2 cm (image 53, series 2), previously, 11.2 x 9.5 cm and 5.1 x 4.7 cm respectively. High-density material within the right renal collecting system may represent residual excreted contrast. There is no definitive high-density material within this fluid collection to be diagnostic of a urine leak. Unchanged appearance of the approximately 5.4 x 5.0 cm previously characterized cyst arising from the posterosuperior aspect the left kidney (image 31, series 2). Normal noncontrast appearance of the bilateral adrenal glands. Stomach/Bowel: No evidence of enteric obstruction. No pneumoperitoneum, pneumatosis or portal venous gas. Vascular/Lymphatic: Atherosclerotic plaque within a mildly tortuous but normal caliber abdominal aorta. Scattered retroperitoneal lymph nodes are unchanged with index retrocaval lymph node measuring 1 cm in greatest short axis diameter (image 34, series 2 and index aortocaval lymph node measuring 0.9 cm, presumably reactive in etiology. Other: Redemonstrated extensive asymmetric soft tissue stranding about the right flank with associated apparent skin thickening compatible with known cellulitis. Musculoskeletal: Post L3-L5 paraspinal fusion and intervertebral disc space replacement. Unchanged moderate (approximately 40%) compression deformity involving the superior endplate of L1. Moderate to severe DDD of L2-L3 with disc space height loss, endplate irregularity and sclerosis. Stigmata of DISH within the lower thoracic spine. IMPRESSION: 1. No change to slight increase in size ill-defined right-sided perinephric fluid. There is no definitive excreted contrast within the ill-defined perinephric fluid collection to provide definitive evidence of a urine leak. No new definable/drainable fluid collections within the abdomen on this noncontrast examination. 2. Unchanged asymmetric right-sided flank  subcutaneous edema and skin thickening compatible with known cellulitis. 3.  Aortic Atherosclerosis (ICD10-I70.0). Electronically Signed   By: Sandi Mariscal M.D.   On: 01/10/2019 08:51   Ct Image Guided Fluid Drain By Catheter  Result Date: 01/10/2019 INDICATION: History of chronic right-sided double-J ureteral stent with recent difficulty exchange now with postprocedural abdominal pain and fevers with CT scan demonstrating complex right-sided perinephric fluid collection and overlying cellulitis Request made for CT-guided aspiration(s) and/or drainage catheter(s) placement for infection source control purposes. EXAM: CT-GUIDED RIGHT-SIDED PERINEPHRIC RENAL ABSCESS DRAINAGE CATHETER PLACEMENT X2 COMPARISON:  CT abdomen pelvis-01/09/2019; 01/08/2019 MEDICATIONS: The patient is currently admitted to the hospital and receiving intravenous antibiotics. The antibiotics were administered within an appropriate time frame prior to the initiation of the procedure. ANESTHESIA/SEDATION: The patient's level of consciousness and vital signs were monitored continuously by radiology nursing throughout the procedure under my direct supervision. Fentanyl 100 mcg IV; Versed 2 mg IV Moderate Sedation Time:  25 minutes CONTRAST:  None COMPLICATIONS: None immediate. PROCEDURE: Informed written consent was obtained from the patient after a discussion of the risks, benefits and alternatives to treatment. The patient was placed prone on the CT gantry and a pre procedural CT was performed re-demonstrating the known abscess/fluid collection within the right perinephric space with dominant component about the superior aspect of the right kidney measuring at least 10.9 x 9.3 cm (image 36, series 2 and dominant component about the caudal aspect of the right kidney measuring approximately 7.0 x 5.1 cm (image 65, series 2). The procedure was planned. A timeout was performed prior to the initiation of the procedure. The skin overlying the right  mid back and flank was prepped and draped in the usual sterile fashion. The overlying soft tissues were anesthetized with 1% lidocaine with epinephrine. Appropriate trajectory towards both the cranial and caudal component of the perirenal fluid collection was planned  with the use of a 22 gauge spinal needles. 18 gauge trocar needles were advanced into the abscess/fluid collections and a short Amplatz super stiff wire was coiled within both collections. Appropriate positioning was confirmed with a limited CT scan. The tract was serially dilated allowing placement of a 10 [place] all-purpose drainage catheters. Appropriate positioning was confirmed with a limited postprocedural CT scan. Approximately 140 cc of purulent fluid was aspirated from the dominant cranial component of the collection and additional 45 cc of purulent fluid was aspirated from the caudal component of the perinephric fluid collection. Both catheters were connected to JP bulbs and sutured in place. A dressing was placed. The patient tolerated the procedure well without immediate post procedural complication. IMPRESSION: 1. Successful CT guided placement of a 10 [place] all purpose drain catheter into the dominant cranial component of the right-sided perinephric abscess with aspiration of 140 mL of purulent fluid. 2. Successful CT-guided placement of a 10 [place] all-purpose drainage catheter into the dominant caudal component of the right-sided perinephric abscess with aspiration of 45 cc of purulent fluid. 3. Representative sample from the aspirated fluid was capped and sent to the laboratory for analysis. Electronically Signed   By: Sandi Mariscal M.D.   On: 01/10/2019 12:10   Ct Image Guided Drainage Percut Cath  Peritoneal Retroperit  Result Date: 01/10/2019 INDICATION: History of chronic right-sided double-J ureteral stent with recent difficulty exchange now with postprocedural abdominal pain and fevers with CT scan demonstrating complex  right-sided perinephric fluid collection and overlying cellulitis Request made for CT-guided aspiration(s) and/or drainage catheter(s) placement for infection source control purposes. EXAM: CT-GUIDED RIGHT-SIDED PERINEPHRIC RENAL ABSCESS DRAINAGE CATHETER PLACEMENT X2 COMPARISON:  CT abdomen pelvis-01/09/2019; 01/08/2019 MEDICATIONS: The patient is currently admitted to the hospital and receiving intravenous antibiotics. The antibiotics were administered within an appropriate time frame prior to the initiation of the procedure. ANESTHESIA/SEDATION: The patient's level of consciousness and vital signs were monitored continuously by radiology nursing throughout the procedure under my direct supervision. Fentanyl 100 mcg IV; Versed 2 mg IV Moderate Sedation Time:  25 minutes CONTRAST:  None COMPLICATIONS: None immediate. PROCEDURE: Informed written consent was obtained from the patient after a discussion of the risks, benefits and alternatives to treatment. The patient was placed prone on the CT gantry and a pre procedural CT was performed re-demonstrating the known abscess/fluid collection within the right perinephric space with dominant component about the superior aspect of the right kidney measuring at least 10.9 x 9.3 cm (image 36, series 2 and dominant component about the caudal aspect of the right kidney measuring approximately 7.0 x 5.1 cm (image 65, series 2). The procedure was planned. A timeout was performed prior to the initiation of the procedure. The skin overlying the right mid back and flank was prepped and draped in the usual sterile fashion. The overlying soft tissues were anesthetized with 1% lidocaine with epinephrine. Appropriate trajectory towards both the cranial and caudal component of the perirenal fluid collection was planned with the use of a 22 gauge spinal needles. 18 gauge trocar needles were advanced into the abscess/fluid collections and a short Amplatz super stiff wire was coiled within  both collections. Appropriate positioning was confirmed with a limited CT scan. The tract was serially dilated allowing placement of a 10 [place] all-purpose drainage catheters. Appropriate positioning was confirmed with a limited postprocedural CT scan. Approximately 140 cc of purulent fluid was aspirated from the dominant cranial component of the collection and additional 45 cc of purulent fluid  was aspirated from the caudal component of the perinephric fluid collection. Both catheters were connected to JP bulbs and sutured in place. A dressing was placed. The patient tolerated the procedure well without immediate post procedural complication. IMPRESSION: 1. Successful CT guided placement of a 10 [place] all purpose drain catheter into the dominant cranial component of the right-sided perinephric abscess with aspiration of 140 mL of purulent fluid. 2. Successful CT-guided placement of a 10 [place] all-purpose drainage catheter into the dominant caudal component of the right-sided perinephric abscess with aspiration of 45 cc of purulent fluid. 3. Representative sample from the aspirated fluid was capped and sent to the laboratory for analysis. Electronically Signed   By: Sandi Mariscal M.D.   On: 01/10/2019 12:10    Anti-infectives: Anti-infectives (From admission, onward)   Start     Dose/Rate Route Frequency Ordered Stop   01/08/19 1400  vancomycin (VANCOCIN) 1,250 mg in sodium chloride 0.9 % 250 mL IVPB  Status:  Discontinued     1,250 mg 166.7 mL/hr over 90 Minutes Intravenous Every 24 hours 01/07/19 1437 01/11/19 0828   01/07/19 1500  vancomycin (VANCOCIN) 1,500 mg in sodium chloride 0.9 % 500 mL IVPB     1,500 mg 250 mL/hr over 120 Minutes Intravenous  Once 01/07/19 1437 01/07/19 1854   01/06/19 1400  ceFAZolin (ANCEF) IVPB 2g/100 mL premix  Status:  Discontinued     2 g 200 mL/hr over 30 Minutes Intravenous Every 8 hours 01/06/19 1051 01/06/19 1138   01/06/19 1400  cefTRIAXone (ROCEPHIN) 2 g in  sodium chloride 0.9 % 100 mL IVPB     2 g 200 mL/hr over 30 Minutes Intravenous Every 24 hours 01/06/19 1138     01/04/19 1800  meropenem (MERREM) 1 g in sodium chloride 0.9 % 100 mL IVPB  Status:  Discontinued     1 g 200 mL/hr over 30 Minutes Intravenous Every 12 hours 01/04/19 1028 01/06/19 1051   01/03/19 2200  meropenem (MERREM) 1 g in sodium chloride 0.9 % 100 mL IVPB  Status:  Discontinued     1 g 200 mL/hr over 30 Minutes Intravenous Every 8 hours 01/03/19 1844 01/04/19 1028   01/03/19 1900  cefTRIAXone (ROCEPHIN) 1 g in sodium chloride 0.9 % 100 mL IVPB     1 g 200 mL/hr over 30 Minutes Intravenous  Once 01/03/19 1846 01/03/19 1919   01/03/19 1823  sodium chloride 0.9 % with cefTRIAXone (ROCEPHIN) ADS Med    Note to Pharmacy:  Ward, Christa   : cabinet override      01/03/19 1823 01/04/19 0629   01/03/19 1652  cefTRIAXone (ROCEPHIN) 1 g in sodium chloride 0.9 % 100 mL IVPB  Status:  Discontinued     1 g 200 mL/hr over 30 Minutes Intravenous 30 min pre-op 01/03/19 1653 01/03/19 1844   01/03/19 1633  cefTRIAXone (ROCEPHIN) 2 g in sodium chloride 0.9 % 100 mL IVPB  Status:  Discontinued     2 g 200 mL/hr over 30 Minutes Intravenous 30 min pre-op 01/03/19 1633 01/03/19 1653   01/03/19 1500  cefTRIAXone (ROCEPHIN) 1 g in sodium chloride 0.9 % 100 mL IVPB  Status:  Discontinued     1 g 200 mL/hr over 30 Minutes Intravenous Every 24 hours 01/03/19 1444 01/03/19 1824   01/03/19 1345  levofloxacin (LEVAQUIN) IVPB 750 mg  Status:  Discontinued     750 mg 100 mL/hr over 90 Minutes Intravenous  Once 01/03/19 1332 01/03/19 1540  Assessment/Plan:  1 - Rt Chronic Hydronephrosis / Partial Ureteral Stricture- stent exchanged, internal renal drainage is adequate. No evidence of urine leak by perinephric fluid Cr.     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 Appreciate IR drainage, these appear in good position. Furhter TX based on Cr and CX data from fluid.   I feel pt should stay in house on broad spectrum IV ABX until perinephric fluid C/S returned enough to make PO ABX recs.   Appreciate medical team comanagment.  Please call me diretly with questions anytime.   Alexis Frock 01/11/2019

## 2019-01-12 DIAGNOSIS — R7881 Bacteremia: Secondary | ICD-10-CM

## 2019-01-12 DIAGNOSIS — J9601 Acute respiratory failure with hypoxia: Secondary | ICD-10-CM

## 2019-01-12 DIAGNOSIS — N1330 Unspecified hydronephrosis: Secondary | ICD-10-CM

## 2019-01-12 DIAGNOSIS — I5032 Chronic diastolic (congestive) heart failure: Secondary | ICD-10-CM

## 2019-01-12 LAB — CBC
HCT: 41.4 % (ref 36.0–46.0)
Hemoglobin: 12.1 g/dL (ref 12.0–15.0)
MCH: 26.5 pg (ref 26.0–34.0)
MCHC: 29.2 g/dL — ABNORMAL LOW (ref 30.0–36.0)
MCV: 90.8 fL (ref 80.0–100.0)
Platelets: 547 10*3/uL — ABNORMAL HIGH (ref 150–400)
RBC: 4.56 MIL/uL (ref 3.87–5.11)
RDW: 14.9 % (ref 11.5–15.5)
WBC: 16.5 10*3/uL — ABNORMAL HIGH (ref 4.0–10.5)
nRBC: 0 % (ref 0.0–0.2)

## 2019-01-12 LAB — BASIC METABOLIC PANEL
Anion gap: 8 (ref 5–15)
BUN: 6 mg/dL — ABNORMAL LOW (ref 8–23)
CO2: 30 mmol/L (ref 22–32)
Calcium: 7.9 mg/dL — ABNORMAL LOW (ref 8.9–10.3)
Chloride: 100 mmol/L (ref 98–111)
Creatinine, Ser: 0.61 mg/dL (ref 0.44–1.00)
GFR calc Af Amer: >60 mL/min (ref >60)
GFR calc non Af Amer: >60 mL/min (ref >60)
Glucose, Bld: 100 mg/dL — ABNORMAL HIGH (ref 70–99)
Potassium: 2.9 mmol/L — ABNORMAL LOW (ref 3.5–5.1)
Sodium: 138 mmol/L (ref 135–145)

## 2019-01-12 LAB — MAGNESIUM: Magnesium: 1.7 mg/dL (ref 1.7–2.4)

## 2019-01-12 MED ORDER — POTASSIUM CHLORIDE CRYS ER 20 MEQ PO TBCR
40.0000 meq | EXTENDED_RELEASE_TABLET | ORAL | Status: AC
  Administered 2019-01-12 (×2): 40 meq via ORAL
  Filled 2019-01-12 (×2): qty 2

## 2019-01-12 MED ORDER — SODIUM CHLORIDE 0.9 % IV SOLN
INTRAVENOUS | Status: DC | PRN
  Administered 2019-01-12: 250 mL via INTRAVENOUS

## 2019-01-12 NOTE — Progress Notes (Signed)
Physical Therapy Treatment Patient Details Name: [name] [name] MRN: [id number] DOB: 09/09/1941 Today's Date: 01/12/2019    History of Present Illness 78 y.o. female with medical history significant for paroxysmal A. fib on Eliquis, chronic diastolic CHF, recurrent ureteral obstruction post ureteral stent placement every 5 months, hyperlipidemia, GERD and admitted for moderately severe right hydronephrosis with obstructed right stent and s/p right stent replacement on 01/03/2019    PT Comments    Assisted OOB to amb a greater distance in hallway with light use/need for walker.  Pt able to safely navigate walker and demonstrated a good alternating gait.  Returned to room then assisted to [facility] to void.  Good use of hands to steady self and good safety cognition.  Assisted back to bed. Pt shared that she is very familiar with [facility] due to past surgeries and she feels she does NOT need HH PT this time.    Follow Up Recommendations  Home health PT;Supervision/Assistance - 24 hour(pt declines "had HH PT before and feels she does not need this time". )     Equipment Recommendations  None recommended by PT    Recommendations for Other Services       Precautions / Restrictions Precautions Precautions: Fall Precaution Comments: R side drains Restrictions Weight Bearing Restrictions: No    Mobility  Bed Mobility Overal bed mobility: Needs Assistance Bed Mobility: Supine to Sit;Sit to Supine     Supine to sit: Min guard;HOB elevated Sit to supine: Min guard;HOB elevated   General bed mobility comments: increased time and effort esp back to bed   Transfers Overall transfer level: Needs assistance Equipment used: None Transfers: Sit to/from [facility] Sit to Stand: Supervision Stand pivot transfers: Supervision;Min guard       General transfer comment: pt self able with good hand placement to steady self   Ambulation/Gait Ambulation/Gait assistance:  Supervision;Min guard Gait Distance (Feet): 55 Feet Assistive device: Rolling walker (2 wheeled) Gait Pattern/deviations: Step-to pattern;Decreased stance time - left     General Gait Details: tolerated an increased distance.  Minimal use/need for walker to steady self.     Stairs             Wheelchair Mobility    Modified Rankin (Stroke Patients Only)       Balance                                            Cognition Arousal/Alertness: Awake/alert Behavior During Therapy: WFL for tasks assessed/performed Overall Cognitive Status: Within Functional Limits for tasks assessed                                        Exercises      General Comments        Pertinent Vitals/Pain Pain Assessment: Faces Faces Pain Scale: Hurts little more Pain Location: R side near drain sites, swelling, tight  Pain Descriptors / Indicators: Sharp Pain Intervention(s): Monitored during session    Home Living                      Prior Function            PT Goals (current goals can now be found in the care plan section) Progress towards PT goals:  Progressing toward goals    Frequency    Min 3X/week      PT Plan Current plan remains appropriate    Co-evaluation              AM-PAC PT "6 Clicks" Mobility   Outcome Measure  Help needed turning from your back to your side while in a flat bed without using bedrails?: A Little Help needed moving from lying on your back to sitting on the side of a flat bed without using bedrails?: A Little Help needed moving to and from a bed to a chair (including a wheelchair)?: A Little Help needed standing up from a chair using your arms (e.g., wheelchair or bedside chair)?: A Little Help needed to walk in hospital room?: A Little Help needed climbing 3-5 steps with a railing? : A Little 6 Click Score: 18    End of Session Equipment Utilized During Treatment: Gait belt Activity  Tolerance: Patient tolerated treatment well Patient left: in bed;with call bell/phone within reach;with bed alarm set   PT Visit Diagnosis: Other abnormalities of gait and mobility (R26.89)     Time: 1430-1455 PT Time Calculation (min) (ACUTE ONLY): 25 min  Charges:  $Gait Training: 8-22 mins $Therapeutic Activity: 8-22 mins                     Rica Koyanagi  PTA Acute  Rehabilitation Services Pager      [phone] Office      [phone]

## 2019-01-12 NOTE — Progress Notes (Signed)
PROGRESS NOTE    Cynthis Purington Postiglione  EHO:[id number] DOB: Apr 27, 1941 DOA: 01/03/2019 PCP: [name], MD   Brief Narrative: [name] [name] is a 78 y.o. female with a history of paroxysmal atrial fibrillation on Eliquis, chronic diastolic heart failure, recurrent ureteral obstruction status post ureteral stent placement, hyperlipidemia, GERD.  Patient presented secondary to significant right flank pain with associated fever and chills.  She is found to have a urinary tract infection in addition to perinephric abscess and E. coli bacteremia.  She has been on antibiotics and is improving.  She is also had a percutaneous drain placed for her abscesses.  Right ureteral stent has been exchanged by urology earlier in admission.   Assessment & Plan:   Active Problems:   Hydronephrosis   Urinary tract infection without hematuria   Occlusion of ureteral stent (HCC)   UTI/Perinephric abscess E. Coli bacteremia Empirically treated with Vancomycin and ceftriaxone initially. Vancomycin discontinued. Blood, urine cultures significant for E. Coli. Abscess culture with GNR to date. -Continue ceftriaxone -Will check with ID for duration of treatment in setting of drained abscess  Hydronephrosis Occlusion of ureteral stent Urology consulted. Stent exchanged. Creatinine improving.   Acute respiratory failure with hypoxia Possibly secondary to atelectasis. Most recent chest x-ray is clear. -Wean to room air -Incentive spirometer  Paroxysmal atrial fibrillation -Continue Eliquis, Coreg, diltiazem  Acute kidney injury Secondary to recurrent ureteral obstruction.  Resolved.  GERD -Continue Protonix (omeprazole as an outpatient)  Chronic diastolic heart failure Stable.  She is on oxygen, however, likely not secondary to heart failure exacerbation.  DVT prophylaxis: Eliquis Code Status:   Code Status: Full Code Family Communication: Husband at bedside Disposition Plan: Discharge likely in  24-48 hours if able to wean off oxygen and culture data   Consultants:   Urology  Interventional radiology  Procedures:   Cystoscopy/right ureteral stent exchange (01/03/2019)  CT-guided right percutaneous drain placement (01/10/2019)  Antimicrobials:  Vancomycin (3/13>>3/16)  Levaquin (3/9)  Meropenem (3/9>>3/11)  Ceftriaxone (3/9; 3/12>>    Subjective: Pain is improved. Some nausea with pain medication.  Objective: Vitals:   01/11/19 0602 01/11/19 1335 01/11/19 2200 01/12/19 0427  BP: (!) 146/65 125/62 (!) 134/59 (!) 152/72  Pulse: 80 75 70 85  Resp: 18 16 16 16   Temp: 98.1 F (36.7 C) 98.5 F (36.9 C) 98.4 F (36.9 C) 98.4 F (36.9 C)  TempSrc: Oral Oral Oral Oral  SpO2: 96% 98% 98% 97%  Weight:    93.9 kg  Height:        Intake/Output Summary (Last 24 hours) at 01/12/2019 1231 Last data filed at 01/12/2019 1000 Gross per 24 hour  Intake 2257.09 ml  Output 272 ml  Net 1985.09 ml   Filed Weights   01/08/19 0500 01/10/19 0708 01/12/19 0427  Weight: 89.9 kg 88 kg 93.9 kg    Examination:  General exam: Appears calm and comfortable Respiratory system: Clear to auscultation. Respiratory effort normal. Cardiovascular system: S1 & S2 heard, RRR. No murmurs, rubs, gallops or clicks. Gastrointestinal system: Abdomen is nondistended, soft and nontender. No organomegaly or masses felt. Normal bowel sounds heard. Right flank tube sites without tenderness Central nervous system: Alert and oriented. No focal neurological deficits. Extremities: No edema. No calf tenderness Skin: No cyanosis. No rashes Psychiatry: Judgement and insight appear normal. Mood & affect appropriate.     Data Reviewed: I have personally reviewed following labs and imaging studies  CBC: Recent Labs  Lab 01/08/19 0631 01/09/19 0554 01/10/19 0417  01/11/19 0346 01/12/19 0347  WBC 16.4* 16.4* 20.3* 19.2* 16.5*  HGB 12.7 12.8 12.5 11.6* 12.1  HCT 41.7 41.9 41.0 38.1 41.4  MCV 90.1  89.7 90.1 90.3 90.8  PLT 450* 474* 470* 472* 409*   Basic Metabolic Panel: Recent Labs  Lab 01/08/19 0631 01/09/19 0554 01/10/19 0417 01/11/19 0346 01/12/19 0347  NA 136 137 135 136 138  K 3.1* 3.4* 3.4* 3.1* 2.9*  CL 97* 99 98 99 100  CO2 29 27 27 30 30   GLUCOSE 84 83 74 88 100*  BUN 16 12 12 10  6*  CREATININE 0.74 0.63 0.56 0.65 0.61  CALCIUM 8.0* 8.0* 7.7* 7.6* 7.9*  MG  --   --   --   --  1.7   GFR: Estimated Creatinine Clearance: 70.4 mL/min (by C-G formula based on SCr of 0.61 mg/dL). Liver Function Tests: Recent Labs  Lab 01/07/19 1136  AST 19  ALT 13  ALKPHOS 59  BILITOT 0.9  PROT 5.6*  ALBUMIN 2.1*   No results for input(s): LIPASE, AMYLASE in the last 168 hours. No results for input(s): AMMONIA in the last 168 hours. Coagulation Profile: No results for input(s): INR, PROTIME in the last 168 hours. Cardiac Enzymes: No results for input(s): CKTOTAL, CKMB, CKMBINDEX, TROPONINI in the last 168 hours. BNP (last 3 results) No results for input(s): PROBNP in the last 8760 hours. HbA1C: No results for input(s): HGBA1C in the last 72 hours. CBG: Recent Labs  Lab 01/11/19 2157  GLUCAP 119*   Lipid Profile: No results for input(s): CHOL, HDL, LDLCALC, TRIG, CHOLHDL, LDLDIRECT in the last 72 hours. Thyroid Function Tests: No results for input(s): TSH, T4TOTAL, FREET4, T3FREE, THYROIDAB in the last 72 hours. Anemia Panel: No results for input(s): VITAMINB12, FOLATE, FERRITIN, TIBC, IRON, RETICCTPCT in the last 72 hours. Sepsis Labs: No results for input(s): PROCALCITON, LATICACIDVEN in the last 168 hours.  Recent Results (from the past 240 hour(s))  Urine Culture     Status: Abnormal   Collection Time: 01/03/19 10:50 AM  Result Value Ref Range Status   Specimen Description   Final    URINE, CLEAN CATCH Performed at [facility], [place] [address], [place], [place] [address]    Special Requests   Final    NONE Performed at [facility], [place] [address], [place], [place] [address]    Culture >=100,000 COLONIES/mL ESCHERICHIA COLI (A)  Final   Report Status 01/06/2019 FINAL  Final   Organism ID, Bacteria ESCHERICHIA COLI (A)  Final      Susceptibility   Escherichia coli - MIC*    AMPICILLIN 16 INTERMEDIATE Intermediate     CEFAZOLIN <=4 SENSITIVE Sensitive     CEFTRIAXONE <=1 SENSITIVE Sensitive     CIPROFLOXACIN >=4 RESISTANT Resistant     GENTAMICIN 4 SENSITIVE Sensitive     IMIPENEM 0.5 SENSITIVE Sensitive     NITROFURANTOIN <=16 SENSITIVE Sensitive     TRIMETH/SULFA <=20 SENSITIVE Sensitive     AMPICILLIN/SULBACTAM 4 SENSITIVE Sensitive     PIP/TAZO 8 SENSITIVE Sensitive     Extended ESBL NEGATIVE Sensitive     * >=100,000 COLONIES/mL ESCHERICHIA COLI  Culture, blood (routine x 2)     Status: Abnormal   Collection Time: 01/03/19  3:44 PM  Result Value Ref Range Status   Specimen Description   Final    BLOOD LEFT ARM Performed at [place] [address], [place], [place] [address]  Special Requests   Final    BOTTLES DRAWN AEROBIC ONLY Blood Culture adequate volume Performed at [place] [address], [place], [place] [address]    Culture  Setup Time   Final    GRAM NEGATIVE RODS AEROBIC BOTTLE ONLY CRITICAL RESULT CALLED TO, READ BACK BY AND VERIFIED WITH: S CHRISTY PHARMD 2025 01/04/19 A BROWNING Performed at [facility], [place] [address], [place], [place] [address]    Culture ESCHERICHIA COLI (A)  Final   Report Status 01/06/2019 FINAL  Final   Organism ID, Bacteria ESCHERICHIA COLI  Final      Susceptibility   Escherichia coli - MIC*    AMPICILLIN <=2 SENSITIVE Sensitive     CEFAZOLIN <=4 SENSITIVE Sensitive     CEFEPIME <=1 SENSITIVE Sensitive     CEFTAZIDIME <=1 SENSITIVE Sensitive     CEFTRIAXONE <=1 SENSITIVE Sensitive     CIPROFLOXACIN >=4 RESISTANT Resistant     GENTAMICIN 4 SENSITIVE Sensitive     IMIPENEM <=0.25  SENSITIVE Sensitive     TRIMETH/SULFA <=20 SENSITIVE Sensitive     AMPICILLIN/SULBACTAM <=2 SENSITIVE Sensitive     PIP/TAZO <=4 SENSITIVE Sensitive     Extended ESBL NEGATIVE Sensitive     * ESCHERICHIA COLI  Culture, blood (routine x 2)     Status: None   Collection Time: 01/03/19  3:44 PM  Result Value Ref Range Status   Specimen Description   Final    BLOOD RIGHT HAND Performed at [place] [address], [place], [place] [address]    Special Requests   Final    BOTTLES DRAWN AEROBIC ONLY Blood Culture adequate volume Performed at [place] [address] [place], [place] [address]    Culture   Final    NO GROWTH 5 DAYS Performed at [facility], [place] [address], [place], [place] [address]    Report Status 01/08/2019 FINAL  Final  Blood Culture ID Panel (Reflexed)     Status: Abnormal   Collection Time: 01/03/19  3:44 PM  Result Value Ref Range Status   Enterococcus species NOT DETECTED NOT DETECTED Final   Listeria monocytogenes NOT DETECTED NOT DETECTED Final   Staphylococcus species NOT DETECTED NOT DETECTED Final   Staphylococcus aureus (BCID) NOT DETECTED NOT DETECTED Final   Streptococcus species NOT DETECTED NOT DETECTED Final   Streptococcus agalactiae NOT DETECTED NOT DETECTED Final   Streptococcus pneumoniae NOT DETECTED NOT DETECTED Final   Streptococcus pyogenes NOT DETECTED NOT DETECTED Final   Acinetobacter baumannii NOT DETECTED NOT DETECTED Final   Enterobacteriaceae species DETECTED (A) NOT DETECTED Final    Comment: Enterobacteriaceae represent a large family of gram-negative bacteria, not a single organism. CRITICAL RESULT CALLED TO, READ BACK BY AND VERIFIED WITH: S CHRISTY PHARMD 2025 01/04/19 A BROWNING    Enterobacter cloacae complex NOT DETECTED NOT DETECTED Final   Escherichia coli DETECTED (A) NOT DETECTED Final    Comment: CRITICAL RESULT CALLED TO, READ BACK BY AND VERIFIED WITH: S CHRISTY  PHARMD 2025 01/04/19 A BROWNING    Klebsiella oxytoca NOT DETECTED NOT DETECTED Final   Klebsiella pneumoniae NOT DETECTED NOT DETECTED Final   Proteus species NOT DETECTED NOT DETECTED Final   Serratia marcescens NOT DETECTED NOT DETECTED Final   Carbapenem resistance NOT DETECTED NOT DETECTED Final   Haemophilus influenzae NOT DETECTED NOT DETECTED Final   Neisseria meningitidis NOT DETECTED NOT DETECTED Final   Pseudomonas aeruginosa NOT  DETECTED NOT DETECTED Final   Candida albicans NOT DETECTED NOT DETECTED Final   Candida glabrata NOT DETECTED NOT DETECTED Final   Candida krusei NOT DETECTED NOT DETECTED Final   Candida parapsilosis NOT DETECTED NOT DETECTED Final   Candida tropicalis NOT DETECTED NOT DETECTED Final    Comment: Performed at [facility], [place] [address] [place], [place] [address]  Culture, Urine     Status: Abnormal   Collection Time: 01/03/19  5:53 PM  Result Value Ref Range Status   Specimen Description   Final    CYSTOSCOPY RIGHT KIDNEY Performed at [place] [address] [place], [place] [address]    Special Requests   Final    NONE Performed at [facility], [place] [address], [place], [place] [address]    Culture >=100,000 COLONIES/mL ESCHERICHIA COLI (A)  Final   Report Status 01/06/2019 FINAL  Final   Organism ID, Bacteria ESCHERICHIA COLI (A)  Final      Susceptibility   Escherichia coli - MIC*    AMPICILLIN 16 INTERMEDIATE Intermediate     CEFAZOLIN <=4 SENSITIVE Sensitive     CEFEPIME <=1 SENSITIVE Sensitive     CEFTAZIDIME <=1 SENSITIVE Sensitive     CEFTRIAXONE <=1 SENSITIVE Sensitive     CIPROFLOXACIN >=4 RESISTANT Resistant     GENTAMICIN <=1 SENSITIVE Sensitive     IMIPENEM <=0.25 SENSITIVE Sensitive     TRIMETH/SULFA <=20 SENSITIVE Sensitive     AMPICILLIN/SULBACTAM 4 SENSITIVE Sensitive     PIP/TAZO <=4 SENSITIVE Sensitive     Extended ESBL NEGATIVE Sensitive     * >=100,000 COLONIES/mL  ESCHERICHIA COLI  Aerobic/Anaerobic Culture (surgical/deep wound)     Status: None (Preliminary result)   Collection Time: 01/10/19 11:03 AM  Result Value Ref Range Status   Specimen Description   Final    ABSCESS PERINEPHRIC DRAIN Performed at [facility], [address], [place], [place] [address]    Special Requests   Final    Normal Performed at [place] [address] [place], [place] [address]    Gram Stain   Final    ABUNDANT WBC PRESENT, PREDOMINANTLY PMN MODERATE GRAM POSITIVE COCCI FEW GRAM POSITIVE RODS RARE GRAM NEGATIVE RODS    Culture   Final    FEW ESCHERICHIA COLI HOLDING FOR POSSIBLE ANAEROBE Performed at [facility], [place] [address], [place], [place] [address]    Report Status PENDING  Incomplete   Organism ID, Bacteria ESCHERICHIA COLI  Final      Susceptibility   Escherichia coli - MIC*    AMPICILLIN <=2 SENSITIVE Sensitive     CEFAZOLIN <=4 SENSITIVE Sensitive     CEFEPIME <=1 SENSITIVE Sensitive     CEFTAZIDIME <=1 SENSITIVE Sensitive     CEFTRIAXONE <=1 SENSITIVE Sensitive     CIPROFLOXACIN >=4 RESISTANT Resistant     GENTAMICIN 2 SENSITIVE Sensitive     IMIPENEM <=0.25 SENSITIVE Sensitive     TRIMETH/SULFA <=20 SENSITIVE Sensitive     AMPICILLIN/SULBACTAM <=2 SENSITIVE Sensitive     PIP/TAZO <=4 SENSITIVE Sensitive     Extended ESBL NEGATIVE Sensitive     * FEW ESCHERICHIA COLI         Radiology Studies: No results found.      Scheduled Meds: . amitriptyline  25 mg Oral QHS  . apixaban  5 mg Oral BID  . carvedilol  3.125 mg Oral BID WC  . diltiazem  120 mg Oral q morning - 10a  . flecainide  75 mg Oral BID  . pantoprazole  40 mg Oral Daily  . polyethylene glycol  17 g Oral Daily  . potassium chloride  40 mEq Oral Q4H  . saccharomyces boulardii  250 mg Oral BID  . senna-docusate  2 tablet Oral BID  . sodium chloride flush  5 mL Intracatheter Q8H   Continuous Infusions: . cefTRIAXone  (ROCEPHIN)  IV Stopped (01/11/19 1441)  . lactated ringers 75 mL/hr at 01/12/19 0548     LOS: 9 days     Cordelia Poche, MD [facility] 01/12/2019, 12:31 PM  If 7PM-7AM, please contact night-coverage [email or web address]

## 2019-01-13 ENCOUNTER — Other Ambulatory Visit: Payer: Self-pay | Admitting: Radiology

## 2019-01-13 DIAGNOSIS — K6819 Other retroperitoneal abscess: Secondary | ICD-10-CM

## 2019-01-13 DIAGNOSIS — N151 Renal and perinephric abscess: Secondary | ICD-10-CM

## 2019-01-13 LAB — BASIC METABOLIC PANEL
Anion gap: 9 (ref 5–15)
BUN: <5 mg/dL — ABNORMAL LOW (ref 8–23)
CO2: 29 mmol/L (ref 22–32)
Calcium: 7.7 mg/dL — ABNORMAL LOW (ref 8.9–10.3)
Chloride: 100 mmol/L (ref 98–111)
Creatinine, Ser: 0.57 mg/dL (ref 0.44–1.00)
GFR calc Af Amer: >60 mL/min (ref >60)
GFR calc non Af Amer: >60 mL/min (ref >60)
Glucose, Bld: 89 mg/dL (ref 70–99)
Potassium: 3.4 mmol/L — ABNORMAL LOW (ref 3.5–5.1)
Sodium: 138 mmol/L (ref 135–145)

## 2019-01-13 LAB — CBC
HCT: 41.1 % (ref 36.0–46.0)
Hemoglobin: 12.2 g/dL (ref 12.0–15.0)
MCH: 26.6 pg (ref 26.0–34.0)
MCHC: 29.7 g/dL — ABNORMAL LOW (ref 30.0–36.0)
MCV: 89.7 fL (ref 80.0–100.0)
Platelets: 536 10*3/uL — ABNORMAL HIGH (ref 150–400)
RBC: 4.58 MIL/uL (ref 3.87–5.11)
RDW: 14.7 % (ref 11.5–15.5)
WBC: 13.7 10*3/uL — ABNORMAL HIGH (ref 4.0–10.5)
nRBC: 0 % (ref 0.0–0.2)

## 2019-01-13 MED ORDER — OXYCODONE-ACETAMINOPHEN 5-325 MG PO TABS: 1.0000 | ORAL_TABLET | Freq: Four times a day (QID) | ORAL | 0 refills | Status: DC | PRN

## 2019-01-13 MED ORDER — POLYETHYLENE GLYCOL 3350 17 G PO PACK: 17.0000 g | PACK | Freq: Every day | ORAL | 0 refills | Status: DC | PRN

## 2019-01-13 MED ORDER — CEFDINIR 300 MG PO CAPS: 300.0000 mg | ORAL_CAPSULE | Freq: Two times a day (BID) | ORAL | 0 refills | Status: AC

## 2019-01-13 NOTE — TOC Transition Note (Signed)
Transition of Care [facility]) - CM/SW Discharge Note   Patient Details  Name: [name] [name] MRN: [id number] Date of Birth: 06-28-1941  Transition of Care [facility]) CM/SW Contact:  Dessa Phi, RN Phone Number: 01/13/2019, 10:14 AM   Clinical Narrative: Paitient plans to d/c home w/HHC-[facility] HHRN/PT/aide. Will fax [place] orders to [facility]. Paitnet has cane,rw,w/c already @ home. Spouse will transport home. No further CM needs.      Final next level of care: [place] Barriers to Discharge: No Barriers Identified   Patient Goals and CMS Choice Patient states their goals for this hospitalization and ongoing recovery are:: (go home) CMS Medicare.gov Compare Post Acute Care list provided to:: Patient Choice offered to / list presented to : Patient  Discharge Placement                       Discharge Plan and Services: Home w/HHC-[facility]-HHRN/PT/aide                        Social Determinants of Health (SDOH) Interventions     Readmission Risk Interventions No flowsheet data found.

## 2019-01-13 NOTE — Discharge Summary (Signed)
Physician Discharge Summary  Megham Dwyer Ruis ZTI:[id number] DOB: 1941-09-30 DOA: 01/03/2019  PCP: [name], MD  Admit date: 01/03/2019 Discharge date: 01/13/2019  Admitted From: Home Disposition: Home  Recommendations for Outpatient Follow-up:  1. Follow up with PCP in 1 week 2. Follow up with urology in 1 week 3. Repeat CT scan per IR next week 4. Extend antibiotics depending on results of CT scan 5. Please obtain BMP/CBC in one week 6. Please follow up on the following pending results: None  Home Health: PT, RN, aide Equipment/Devices: Percutaneous drains x2  Discharge Condition: Stable CODE STATUS: Full code Diet recommendation: Heart healthy   Brief/Interim Summary:  Admission HPI written by Kayleen Memos, DO   Chief Complaint: Severe right flank pain  HPI: [name] [name] is a 78 y.o. female with medical history significant for paroxysmal A. fib on Eliquis, chronic diastolic CHF, recurrent ureteral obstruction post ureteral stent placement every 5 months, hyperlipidemia, GERD who presented to [facility] ED with complaints of sudden onset severe right flank pain that started earlier this morning around 4 AM.  Associated with chills.  Last visit with her urologist was 2-3 weeks ago.  She was then treated with Bactrim and completed her course of antibiotic for UTI.  No other changes to her medications.  States has been feeling poorly in the last 3 days and suddenly started having right flank pain early this morning.  No other complaints.  ED Course: Upon presentation to the ED, vital signs are remarkable for tachycardia with rate of 114.  Lab studies remarkable for leukocytosis with WBC 17 K and urine analysis positive for pyuria.  CT abdomen pelvis with contrast done on 01/03/2019 reveals moderate to moderately severe right hydronephrosis with a right double-J ureteral stent in good position and extensive stranding about the right kidney and proximal ureter worse than on the  prior exam and worrisome for urinary tract infection/urethritis.  ED physician consulted urology.  [facility] asked to admit.    Hospital course:  UTI/Perinephric abscess E. Coli bacteremia Empirically treated with Vancomycin and ceftriaxone initially. Vancomycin discontinued. Blood, urine cultures significant for E. Coli. Abscess culture significant for E. Coli as well. Discussed with ID and okay with discharge on oral antibiotics. WBC trending down. Afebrile. Discharge with 10 day prescription of Cefdinir for total 21 day course which may need to be extended depending on CT results. Patient will follow-up with interventional radiology as an outpatient.  Hydronephrosis Occlusion of ureteral stent Urology consulted. Stent exchanged. Creatinine improving.   Acute respiratory failure with hypoxia Possibly secondary to atelectasis. Most recent chest x-ray is clear. Weaned successfully to room air.  Paroxysmal atrial fibrillation Continue Eliquis, Coreg, diltiazem  Acute kidney injury Secondary to recurrent ureteral obstruction.  Resolved.  GERD Continue Omeprazole  Chronic diastolic heart failure Stable.  Discharge Diagnoses:  Active Problems:   Hydronephrosis   Urinary tract infection without hematuria   Occlusion of ureteral stent [facility])    Discharge Instructions  Discharge Instructions    Call MD for:  redness, tenderness, or signs of infection (pain, swelling, redness, odor or green/yellow discharge around incision site)   Complete by:  As directed    Call MD for:  severe uncontrolled pain   Complete by:  As directed    Call MD for:  temperature >100.4   Complete by:  As directed    Diet - low sodium heart healthy   Complete by:  As directed    Increase activity  slowly   Complete by:  As directed      Allergies as of 01/13/2019      Reactions   Codeine Nausea Only, Other (See Comments)   hallucinations   Hydrocodone Nausea Only, Other (See Comments)    Macrodantin [nitrofurantoin] Nausea And Vomiting      Medication List    STOP taking these medications   furosemide 40 MG tablet Commonly known as:  LASIX   traMADol 50 MG tablet Commonly known as:  ULTRAM     TAKE these medications   acetaminophen 500 MG tablet Commonly known as:  TYLENOL Take 1,000 mg by mouth 2 (two) times daily as needed for moderate pain or headache.   amitriptyline 25 MG tablet Commonly known as:  ELAVIL Take 25 mg by mouth at bedtime.   carvedilol 6.25 MG tablet Commonly known as:  COREG TAKE 1/2 TABLET BY MOUTH TWICE DAILY   cefdinir 300 MG capsule Commonly known as:  OMNICEF Take 1 capsule (300 mg total) by mouth 2 (two) times daily for 10 days.   clotrimazole-betamethasone cream Commonly known as:  LOTRISONE Apply 1 application topically 2 (two) times daily as needed for rash.   diltiazem 120 MG 24 hr capsule Commonly known as:  CARDIZEM CD Take 1 capsule (120 mg total) by mouth every morning. What changed:  when to take this   Eliquis 5 MG Tabs tablet Generic drug:  apixaban Take 1 tablet (5 mg total) by mouth 2 (two) times daily. What changed:  See the new instructions.   ferrous sulfate 325 (65 FE) MG EC tablet Take 325 mg by mouth 2 (two) times daily.   flecainide 50 MG tablet Commonly known as:  TAMBOCOR Take 1.5 tablets (75 mg total) by mouth 2 (two) times daily.   ketoconazole 2 % shampoo Commonly known as:  NIZORAL Apply 1 application topically daily as needed for rash.   omeprazole 40 MG capsule Commonly known as:  PRILOSEC Take 40 mg by mouth every morning.   ondansetron 4 MG disintegrating tablet Commonly known as:  ZOFRAN-ODT Take 4 mg by mouth every 6 (six) hours as needed for nausea/vomiting.   oxyCODONE-acetaminophen 5-325 MG tablet Commonly known as:  PERCOCET/ROXICET Take 1 tablet by mouth every 6 (six) hours as needed for moderate pain.   polyethylene glycol packet Commonly known as:  MIRALAX /  GLYCOLAX Take 17 g by mouth daily as needed for mild constipation.   potassium chloride 10 MEQ tablet Commonly known as:  K-DUR Take 1 tablet (10 mEq total) by mouth as needed. When you take Lasix.   triamcinolone cream 0.1 % Commonly known as:  KENALOG Apply 1 application topically 2 (two) times daily as needed for rash.      [place] Follow up.   Specialty:  [place] Why:  [facility] nursing/physical therapy/aide Contact information: [address] [place] [address] [phone]        [name], MD. Schedule an appointment as soon as possible for a visit in 1 week(s).   Specialty:  Family Medicine Contact information: [address] [place] [address] [phone]        Alexis Frock, MD. Schedule an appointment as soon as possible for a visit in 1 week(s).   Specialty:  Urology Contact information: [place] [address] [phone]          Allergies  Allergen Reactions   Codeine Nausea Only  and Other (See Comments)    hallucinations   Hydrocodone Nausea Only and Other (See Comments)   Macrodantin [Nitrofurantoin] Nausea And Vomiting    Consultations:  Urology  Interventional radiology   Procedures/Studies: Ct Abdomen Wo Contrast  Result Date: 01/10/2019 CLINICAL DATA:  Evaluate perinephric fluid collection. EXAM: CT ABDOMEN WITHOUT CONTRAST TECHNIQUE: Multidetector CT imaging of the abdomen was performed following the standard protocol without IV contrast. COMPARISON:  CT abdomen pelvis-12/29/2018; 01/03/2019; 05/07/2016; nuclear medicine renal scintigraphy-06/09/2016 FINDINGS: Lower chest: Limited visualization of the lower thorax demonstrates a small right-sided pleural effusion associated bibasilar heterogeneous/consolidative opacities, right greater than left. Normal heart size.  No pericardial effusion. Hepatobiliary: There is mild nodularity hepatic contour.  Post cholecystectomy. Pancreas: Normal noncontrast appearance of the pancreas. Spleen: Normal noncontrast appearance of the spleen. Adrenals/Urinary Tract: Redemonstrated right-sided double-J ureteral stent with superior coil within the right renal collecting system with associated presumably postoperative air within the right renal collecting system. The amount of ill-defined perinephric fluid is unchanged to very slightly progressed compared to the 01/08/2019 examination. Dominant component about the posterosuperior aspect of the right kidney measures approximately 11.8 x 8.5 cm (image 34, series 2) while additional component about the caudal aspect of the right kidney measures approximately 5.8 x 5.2 cm (image 53, series 2), previously, 11.2 x 9.5 cm and 5.1 x 4.7 cm respectively. High-density material within the right renal collecting system may represent residual excreted contrast. There is no definitive high-density material within this fluid collection to be diagnostic of a urine leak. Unchanged appearance of the approximately 5.4 x 5.0 cm previously characterized cyst arising from the posterosuperior aspect the left kidney (image 31, series 2). Normal noncontrast appearance of the bilateral adrenal glands. Stomach/Bowel: No evidence of enteric obstruction. No pneumoperitoneum, pneumatosis or portal venous gas. Vascular/Lymphatic: Atherosclerotic plaque within a mildly tortuous but normal caliber abdominal aorta. Scattered retroperitoneal lymph nodes are unchanged with index retrocaval lymph node measuring 1 cm in greatest short axis diameter (image 34, series 2 and index aortocaval lymph node measuring 0.9 cm, presumably reactive in etiology. Other: Redemonstrated extensive asymmetric soft tissue stranding about the right flank with associated apparent skin thickening compatible with known cellulitis. Musculoskeletal: Post L3-L5 paraspinal fusion and intervertebral disc space replacement. Unchanged moderate  (approximately 40%) compression deformity involving the superior endplate of L1. Moderate to severe DDD of L2-L3 with disc space height loss, endplate irregularity and sclerosis. Stigmata of DISH within the lower thoracic spine. IMPRESSION: 1. No change to slight increase in size ill-defined right-sided perinephric fluid. There is no definitive excreted contrast within the ill-defined perinephric fluid collection to provide definitive evidence of a urine leak. No new definable/drainable fluid collections within the abdomen on this noncontrast examination. 2. Unchanged asymmetric right-sided flank subcutaneous edema and skin thickening compatible with known cellulitis. 3.  Aortic Atherosclerosis (ICD10-I70.0). Electronically Signed   By: Sandi Mariscal M.D.   On: 01/10/2019 08:51   Dg Chest 1 View  Result Date: 01/06/2019 CLINICAL DATA:  Fever and shortness of breath on exertion. EXAM: CHEST  1 VIEW COMPARISON:  PA and lateral chest 01/03/2019 and 07/28/2017. CT abdomen and pelvis 01/03/2019. FINDINGS: The lungs are clear clear. Prominent epicardial fat at the left cardiophrenic angle noted, unchanged. No pneumothorax or pleural effusion. Atherosclerosis noted. Heart size is upper normal. IMPRESSION: No acute disease. Electronically Signed   By: Inge Rise M.D.   On: 01/06/2019 15:25   Dg Chest 2 View  Result Date: 01/03/2019 CLINICAL DATA:  Acute onset severe  right side pain and shortness of breath at 4 a.m. this morning. EXAM: CHEST - 2 VIEW COMPARISON:  PA and lateral chest 07/28/2017. Single-view of the chest 05/08/2015. FINDINGS: The lungs are clear. Heart size is normal. There is no pneumothorax or pleural effusion. Aortic atherosclerosis is noted. No acute bony abnormality. Remote compression fracture in the upper lumbar spine is seen on the 2018 exam. IMPRESSION: No acute disease. Atherosclerosis. Electronically Signed   By: Inge Rise M.D.   On: 01/03/2019 10:17   Ct Abdomen Pelvis W  Contrast  Result Date: 01/08/2019 CLINICAL DATA:  78 year old female with acute RIGHT flank pain following stent placement on 01/03/2019. RIGHT flank erythema. EXAM: CT ABDOMEN AND PELVIS WITH CONTRAST TECHNIQUE: Multidetector CT imaging of the abdomen and pelvis was performed using the standard protocol following bolus administration of intravenous contrast. CONTRAST:  165mL OMNIPAQUE IOHEXOL 300 MG/ML  SOLN COMPARISON:  01/07/2019 ultrasound and 01/03/2019 CT FINDINGS: Lower chest: New small RIGHT pleural effusion noted with increasing bibasilar atelectasis. Cardiomegaly again noted. Hepatobiliary: No hepatic abnormalities. The patient is status post cholecystectomy. No biliary dilatation. Pancreas: Unremarkable Spleen: Unremarkable Adrenals/Urinary Tract: A RIGHT ureteral stent is noted with tip and posterior UPPER RIGHT kidney and bladder. Hydronephrosis has slightly decreased. Gas in the renal collecting system may be related from recent procedure. Increasing RIGHT perinephric inflammation/fluid identified with new inflammation in the RIGHT subcutaneous fat overlying the RIGHT kidney. Bilateral renal cortical atrophy noted with unchanged LEFT renal cyst. Gas in the bladder is likely from recent procedure. The adrenal glands are unremarkable. Stomach/Bowel: Stomach is within normal limits. No evidence of bowel wall thickening, distention, or inflammatory changes. Vascular/Lymphatic: Aortic atherosclerosis. No enlarged abdominal or pelvic lymph nodes. Reproductive: Status post hysterectomy. No adnexal masses. Pessary again noted Other: No ascites or pneumoperitoneum. Musculoskeletal: No acute abnormality. L2 compression fracture, L3-L5 lumbar fusion changes and RIGHT total hip arthroplasty changes again noted. IMPRESSION: 1. Increasing RIGHT perinephric inflammation and fluid as well as new inflammation in the RIGHT subcutaneous fat overlying the RIGHT renal region, and may represent infection. 2. New small  RIGHT pleural effusion and increasing bibasilar atelectasis. 3. RIGHT ureteral stent with decreased RIGHT hydronephrosis. Gas in the renal collecting system and bladder may be from recent procedure versus infection. Electronically Signed   By: Margarette Canada M.D.   On: 01/08/2019 11:13   Ct Abdomen Pelvis W Contrast  Result Date: 01/03/2019 CLINICAL DATA:  Acute onset right side abdominal pain and shortness of breath at 4 a.m. today. EXAM: CT ABDOMEN AND PELVIS WITH CONTRAST TECHNIQUE: Multidetector CT imaging of the abdomen and pelvis was performed using the standard protocol following bolus administration of intravenous contrast. CONTRAST:  80 mL ISOVUE-300 IOPAMIDOL (ISOVUE-300) INJECTION 61% COMPARISON:  CT abdomen and pelvis 05/07/2016. FINDINGS: Lower chest: There is mild cardiomegaly. Dependent atelectasis is greater on the right. No pleural or pericardial effusion. Hepatobiliary: Status post cholecystectomy. No focal lesion. Minimal intrahepatic biliary ductal dilatation is likely related to cholecystectomy. Pancreas: Unremarkable. No pancreatic ductal dilatation or surrounding inflammatory changes. Spleen: Normal in size without focal abnormality. Adrenals/Urinary Tract: The patient has a right double-J ureteral stent in place which is new since the prior CT. The stent projects in good position but there is moderate to moderately severe right hydronephrosis which appears unchanged since the prior examination. Extensive stranding is present about the right kidney and proximal ureter and worse than on the prior study. Left renal cyst is unchanged. The kidneys and ureters are otherwise unremarkable. The urinary  bladder is almost completely decompressed. The adrenal glands appear normal. Stomach/Bowel: Mild sigmoid diverticulosis without diverticulitis is noted. The colon is otherwise normal in appearance. The appendix is not seen but no evidence of appendicitis is identified. The stomach and small bowel appear  normal. Vascular/Lymphatic: Aortic atherosclerosis. No enlarged abdominal or pelvic lymph nodes. Reproductive: Status post hysterectomy. No adnexal masses. Pessary device noted. Other: None. Musculoskeletal: The patient is status post L3-5 fusion. Remote L2 compression fracture is unchanged. No acute or new bony abnormality is identified. IMPRESSION: Moderate to moderately severe right hydronephrosis does not appear markedly changed compared to the prior examination with a right double-J ureteral stent in good position. Extensive stranding about the right kidney and proximal ureter is worse than on the prior exam and worrisome for urinary tract infection/ureteritis. No CT signs of pyelonephritis. No abscess. Atherosclerosis. Mild sigmoid diverticulosis without diverticulitis. Electronically Signed   By: Inge Rise M.D.   On: 01/03/2019 12:49   US Renal  Result Date: 01/07/2019 CLINICAL DATA:  Right flank pain.  Right ureteral stent EXAM: RENAL / URINARY TRACT ULTRASOUND COMPLETE COMPARISON:  Abdomen pelvis CT dated 01/03/2019. FINDINGS: Right Kidney: Renal measurements: 13.5 x 6.7 x 6.0 cm = volume: 283 mL . Echogenicity within normal limits. No mass or hydronephrosis visualized. Extensive perinephric fluid. Left Kidney: Renal measurements: 13.6 x 6.5 x 6.1 cm = volume: 285 mL. Echogenicity within normal limits. 5.5 cm upper pole cyst. No mass or hydronephrosis visualized. Bladder: Not distended with a Foley catheter in place. IMPRESSION: 1. Extensive right perinephric fluid with no visible residual hydronephrosis. 2. 5.5 cm simple left renal cyst. Electronically Signed   By: Claudie Revering M.D.   On: 01/07/2019 12:43   Dg C-arm 1-60 Min-no Report  Result Date: 01/03/2019 Fluoroscopy was utilized by the requesting physician.  No radiographic interpretation.   Ct Image Guided Fluid Drain By Catheter  Result Date: 01/10/2019 INDICATION: History of chronic right-sided double-J ureteral stent with recent  difficulty exchange now with postprocedural abdominal pain and fevers with CT scan demonstrating complex right-sided perinephric fluid collection and overlying cellulitis Request made for CT-guided aspiration(s) and/or drainage catheter(s) placement for infection source control purposes. EXAM: CT-GUIDED RIGHT-SIDED PERINEPHRIC RENAL ABSCESS DRAINAGE CATHETER PLACEMENT X2 COMPARISON:  CT abdomen pelvis-01/09/2019; 01/08/2019 MEDICATIONS: The patient is currently admitted to the hospital and receiving intravenous antibiotics. The antibiotics were administered within an appropriate time frame prior to the initiation of the procedure. ANESTHESIA/SEDATION: The patient's level of consciousness and vital signs were monitored continuously by radiology nursing throughout the procedure under my direct supervision. Fentanyl 100 mcg IV; Versed 2 mg IV Moderate Sedation Time:  25 minutes CONTRAST:  None COMPLICATIONS: None immediate. PROCEDURE: Informed written consent was obtained from the patient after a discussion of the risks, benefits and alternatives to treatment. The patient was placed prone on the CT gantry and a pre procedural CT was performed re-demonstrating the known abscess/fluid collection within the right perinephric space with dominant component about the superior aspect of the right kidney measuring at least 10.9 x 9.3 cm (image 36, series 2 and dominant component about the caudal aspect of the right kidney measuring approximately 7.0 x 5.1 cm (image 65, series 2). The procedure was planned. A timeout was performed prior to the initiation of the procedure. The skin overlying the right mid back and flank was prepped and draped in the usual sterile fashion. The overlying soft tissues were anesthetized with 1% lidocaine with epinephrine. Appropriate trajectory towards both the cranial  and caudal component of the perirenal fluid collection was planned with the use of a 22 gauge spinal needles. 18 gauge trocar  needles were advanced into the abscess/fluid collections and a short Amplatz super stiff wire was coiled within both collections. Appropriate positioning was confirmed with a limited CT scan. The tract was serially dilated allowing placement of a 10 [place] all-purpose drainage catheters. Appropriate positioning was confirmed with a limited postprocedural CT scan. Approximately 140 cc of purulent fluid was aspirated from the dominant cranial component of the collection and additional 45 cc of purulent fluid was aspirated from the caudal component of the perinephric fluid collection. Both catheters were connected to JP bulbs and sutured in place. A dressing was placed. The patient tolerated the procedure well without immediate post procedural complication. IMPRESSION: 1. Successful CT guided placement of a 10 [place] all purpose drain catheter into the dominant cranial component of the right-sided perinephric abscess with aspiration of 140 mL of purulent fluid. 2. Successful CT-guided placement of a 10 [place] all-purpose drainage catheter into the dominant caudal component of the right-sided perinephric abscess with aspiration of 45 cc of purulent fluid. 3. Representative sample from the aspirated fluid was capped and sent to the laboratory for analysis. Electronically Signed   By: Sandi Mariscal M.D.   On: 01/10/2019 12:10   Ct Image Guided Drainage Percut Cath  Peritoneal Retroperit  Result Date: 01/10/2019 INDICATION: History of chronic right-sided double-J ureteral stent with recent difficulty exchange now with postprocedural abdominal pain and fevers with CT scan demonstrating complex right-sided perinephric fluid collection and overlying cellulitis Request made for CT-guided aspiration(s) and/or drainage catheter(s) placement for infection source control purposes. EXAM: CT-GUIDED RIGHT-SIDED PERINEPHRIC RENAL ABSCESS DRAINAGE CATHETER PLACEMENT X2 COMPARISON:  CT abdomen pelvis-01/09/2019; 01/08/2019  MEDICATIONS: The patient is currently admitted to the hospital and receiving intravenous antibiotics. The antibiotics were administered within an appropriate time frame prior to the initiation of the procedure. ANESTHESIA/SEDATION: The patient's level of consciousness and vital signs were monitored continuously by radiology nursing throughout the procedure under my direct supervision. Fentanyl 100 mcg IV; Versed 2 mg IV Moderate Sedation Time:  25 minutes CONTRAST:  None COMPLICATIONS: None immediate. PROCEDURE: Informed written consent was obtained from the patient after a discussion of the risks, benefits and alternatives to treatment. The patient was placed prone on the CT gantry and a pre procedural CT was performed re-demonstrating the known abscess/fluid collection within the right perinephric space with dominant component about the superior aspect of the right kidney measuring at least 10.9 x 9.3 cm (image 36, series 2 and dominant component about the caudal aspect of the right kidney measuring approximately 7.0 x 5.1 cm (image 65, series 2). The procedure was planned. A timeout was performed prior to the initiation of the procedure. The skin overlying the right mid back and flank was prepped and draped in the usual sterile fashion. The overlying soft tissues were anesthetized with 1% lidocaine with epinephrine. Appropriate trajectory towards both the cranial and caudal component of the perirenal fluid collection was planned with the use of a 22 gauge spinal needles. 18 gauge trocar needles were advanced into the abscess/fluid collections and a short Amplatz super stiff wire was coiled within both collections. Appropriate positioning was confirmed with a limited CT scan. The tract was serially dilated allowing placement of a 10 [place] all-purpose drainage catheters. Appropriate positioning was confirmed with a limited postprocedural CT scan. Approximately 140 cc of purulent fluid was aspirated from the  dominant cranial  component of the collection and additional 45 cc of purulent fluid was aspirated from the caudal component of the perinephric fluid collection. Both catheters were connected to JP bulbs and sutured in place. A dressing was placed. The patient tolerated the procedure well without immediate post procedural complication. IMPRESSION: 1. Successful CT guided placement of a 10 [place] all purpose drain catheter into the dominant cranial component of the right-sided perinephric abscess with aspiration of 140 mL of purulent fluid. 2. Successful CT-guided placement of a 10 [place] all-purpose drainage catheter into the dominant caudal component of the right-sided perinephric abscess with aspiration of 45 cc of purulent fluid. 3. Representative sample from the aspirated fluid was capped and sent to the laboratory for analysis. Electronically Signed   By: Sandi Mariscal M.D.   On: 01/10/2019 12:10      Cystoscopy/right ureteral stent exchange (01/03/2019)  CT-guided right percutaneous drain placement (01/10/2019)   Subjective: Pain improved. Some loose stools. Otherwise no other complaints.  Discharge Exam: Vitals:   01/13/19 0536 01/13/19 1116  BP: (!) 155/73   Pulse: 97   Resp: 16   Temp: 98.1 F (36.7 C)   SpO2: 95% 93%   Vitals:   01/12/19 1321 01/12/19 2039 01/13/19 0536 01/13/19 1116  BP: (!) 143/66 (!) 146/67 (!) 155/73   Pulse: 76 76 97   Resp: 16 16 16    Temp: 98.4 F (36.9 C) 98.7 F (37.1 C) 98.1 F (36.7 C)   TempSrc: Oral Oral Oral   SpO2: 100% 93% 95% 93%  Weight:   93.3 kg   Height:        General: Pt is alert, awake, not in acute distress Cardiovascular: RRR, S1/S2 +, no rubs, no gallops Respiratory: CTA bilaterally, no wheezing, no rhonchi Abdominal: Soft, NT, ND, bowel sounds +. Drains with small amounts of purulent fluid. Extremities: no edema, no cyanosis    The results of significant diagnostics from this hospitalization (including imaging,  microbiology, ancillary and laboratory) are listed below for reference.     Microbiology: Recent Results (from the past 240 hour(s))  Culture, blood (routine x 2)     Status: Abnormal   Collection Time: 01/03/19  3:44 PM  Result Value Ref Range Status   Specimen Description   Final    BLOOD LEFT ARM Performed at [facility], [place] [address], [place], [place] [address]    Special Requests   Final    BOTTLES DRAWN AEROBIC ONLY Blood Culture adequate volume Performed at [facility] [address], [place], [place] [address]    Culture  Setup Time   Final    GRAM NEGATIVE RODS AEROBIC BOTTLE ONLY CRITICAL RESULT CALLED TO, READ BACK BY AND VERIFIED WITH: S CHRISTY PHARMD 2025 01/04/19 A BROWNING Performed at [facility], [place] [address], [place], [place] [address]    Culture ESCHERICHIA COLI (A)  Final   Report Status 01/06/2019 FINAL  Final   Organism ID, Bacteria ESCHERICHIA COLI  Final      Susceptibility   Escherichia coli - MIC*    AMPICILLIN <=2 SENSITIVE Sensitive     CEFAZOLIN <=4 SENSITIVE Sensitive     CEFEPIME <=1 SENSITIVE Sensitive     CEFTAZIDIME <=1 SENSITIVE Sensitive     CEFTRIAXONE <=1 SENSITIVE Sensitive     CIPROFLOXACIN >=4 RESISTANT Resistant     GENTAMICIN 4 SENSITIVE Sensitive     IMIPENEM <=0.25 SENSITIVE Sensitive     TRIMETH/SULFA <=20 SENSITIVE Sensitive  AMPICILLIN/SULBACTAM <=2 SENSITIVE Sensitive     PIP/TAZO <=4 SENSITIVE Sensitive     Extended ESBL NEGATIVE Sensitive     * ESCHERICHIA COLI  Culture, blood (routine x 2)     Status: None   Collection Time: 01/03/19  3:44 PM  Result Value Ref Range Status   Specimen Description   Final    BLOOD RIGHT HAND Performed at [facility] [address] [place] [address], [place], [place] [address]    Special Requests   Final    BOTTLES DRAWN AEROBIC ONLY Blood Culture adequate volume Performed at [facility] [address]  [place], [place] [address]    Culture   Final    NO GROWTH 5 DAYS Performed at [facility], [place] [address], [place], [place] [address]    Report Status 01/08/2019 FINAL  Final  Blood Culture ID Panel (Reflexed)     Status: Abnormal   Collection Time: 01/03/19  3:44 PM  Result Value Ref Range Status   Enterococcus species NOT DETECTED NOT DETECTED Final   Listeria monocytogenes NOT DETECTED NOT DETECTED Final   Staphylococcus species NOT DETECTED NOT DETECTED Final   Staphylococcus aureus (BCID) NOT DETECTED NOT DETECTED Final   Streptococcus species NOT DETECTED NOT DETECTED Final   Streptococcus agalactiae NOT DETECTED NOT DETECTED Final   Streptococcus pneumoniae NOT DETECTED NOT DETECTED Final   Streptococcus pyogenes NOT DETECTED NOT DETECTED Final   Acinetobacter baumannii NOT DETECTED NOT DETECTED Final   Enterobacteriaceae species DETECTED (A) NOT DETECTED Final    Comment: Enterobacteriaceae represent a large family of gram-negative bacteria, not a single organism. CRITICAL RESULT CALLED TO, READ BACK BY AND VERIFIED WITH: S CHRISTY PHARMD 2025 01/04/19 A BROWNING    Enterobacter cloacae complex NOT DETECTED NOT DETECTED Final   Escherichia coli DETECTED (A) NOT DETECTED Final    Comment: CRITICAL RESULT CALLED TO, READ BACK BY AND VERIFIED WITH: S CHRISTY PHARMD 2025 01/04/19 A BROWNING    Klebsiella oxytoca NOT DETECTED NOT DETECTED Final   Klebsiella pneumoniae NOT DETECTED NOT DETECTED Final   Proteus species NOT DETECTED NOT DETECTED Final   Serratia marcescens NOT DETECTED NOT DETECTED Final   Carbapenem resistance NOT DETECTED NOT DETECTED Final   Haemophilus influenzae NOT DETECTED NOT DETECTED Final   Neisseria meningitidis NOT DETECTED NOT DETECTED Final   Pseudomonas aeruginosa NOT DETECTED NOT DETECTED Final   Candida albicans NOT DETECTED NOT DETECTED Final   Candida glabrata NOT DETECTED NOT DETECTED Final   Candida krusei NOT DETECTED NOT DETECTED Final    Candida parapsilosis NOT DETECTED NOT DETECTED Final   Candida tropicalis NOT DETECTED NOT DETECTED Final    Comment: Performed at [facility], [place]. [address] [place], [place] [address]  Culture, Urine     Status: Abnormal   Collection Time: 01/03/19  5:53 PM  Result Value Ref Range Status   Specimen Description   Final    CYSTOSCOPY RIGHT KIDNEY Performed at [facility] [address], [place], [place] [address]    Special Requests   Final    NONE Performed at [facility], [place] [address], [place], [place] [address]    Culture >=100,000 COLONIES/mL ESCHERICHIA COLI (A)  Final   Report Status 01/06/2019 FINAL  Final   Organism ID, Bacteria ESCHERICHIA COLI (A)  Final      Susceptibility   Escherichia coli - MIC*    AMPICILLIN 16 INTERMEDIATE Intermediate  CEFAZOLIN <=4 SENSITIVE Sensitive     CEFEPIME <=1 SENSITIVE Sensitive     CEFTAZIDIME <=1 SENSITIVE Sensitive     CEFTRIAXONE <=1 SENSITIVE Sensitive     CIPROFLOXACIN >=4 RESISTANT Resistant     GENTAMICIN <=1 SENSITIVE Sensitive     IMIPENEM <=0.25 SENSITIVE Sensitive     TRIMETH/SULFA <=20 SENSITIVE Sensitive     AMPICILLIN/SULBACTAM 4 SENSITIVE Sensitive     PIP/TAZO <=4 SENSITIVE Sensitive     Extended ESBL NEGATIVE Sensitive     * >=100,000 COLONIES/mL ESCHERICHIA COLI  Aerobic/Anaerobic Culture (surgical/deep wound)     Status: None (Preliminary result)   Collection Time: 01/10/19 11:03 AM  Result Value Ref Range Status   Specimen Description   Final    ABSCESS PERINEPHRIC DRAIN Performed at [facility], [place] [address], [place], [place] [address]    Special Requests   Final    Normal Performed at [facility] [address] [place], [place] [address]    Gram Stain   Final    ABUNDANT WBC PRESENT, PREDOMINANTLY PMN MODERATE GRAM POSITIVE COCCI FEW GRAM POSITIVE RODS RARE GRAM NEGATIVE RODS    Culture   Final    FEW ESCHERICHIA  COLI FEW BACTEROIDES THETAIOTAOMICRON BETA LACTAMASE POSITIVE Performed at [facility], [place] [address] [place], [place] [address]    Report Status PENDING  Incomplete   Organism ID, Bacteria ESCHERICHIA COLI  Final      Susceptibility   Escherichia coli - MIC*    AMPICILLIN <=2 SENSITIVE Sensitive     CEFAZOLIN <=4 SENSITIVE Sensitive     CEFEPIME <=1 SENSITIVE Sensitive     CEFTAZIDIME <=1 SENSITIVE Sensitive     CEFTRIAXONE <=1 SENSITIVE Sensitive     CIPROFLOXACIN >=4 RESISTANT Resistant     GENTAMICIN 2 SENSITIVE Sensitive     IMIPENEM <=0.25 SENSITIVE Sensitive     TRIMETH/SULFA <=20 SENSITIVE Sensitive     AMPICILLIN/SULBACTAM <=2 SENSITIVE Sensitive     PIP/TAZO <=4 SENSITIVE Sensitive     Extended ESBL NEGATIVE Sensitive     * FEW ESCHERICHIA COLI     Labs: BNP (last 3 results) No results for input(s): BNP in the last 8760 hours. Basic Metabolic Panel: Recent Labs  Lab 01/09/19 0554 01/10/19 0417 01/11/19 0346 01/12/19 0347 01/13/19 0405  NA 137 135 136 138 138  K 3.4* 3.4* 3.1* 2.9* 3.4*  CL 99 98 99 100 100  CO2 27 27 30 30 29   GLUCOSE 83 74 88 100* 89  BUN 12 12 10  6* <5*  CREATININE 0.63 0.56 0.65 0.61 0.57  CALCIUM 8.0* 7.7* 7.6* 7.9* 7.7*  MG  --   --   --  1.7  --    Liver Function Tests: Recent Labs  Lab 01/07/19 1136  AST 19  ALT 13  ALKPHOS 59  BILITOT 0.9  PROT 5.6*  ALBUMIN 2.1*   No results for input(s): LIPASE, AMYLASE in the last 168 hours. No results for input(s): AMMONIA in the last 168 hours. CBC: Recent Labs  Lab 01/09/19 0554 01/10/19 0417 01/11/19 0346 01/12/19 0347 01/13/19 0405  WBC 16.4* 20.3* 19.2* 16.5* 13.7*  HGB 12.8 12.5 11.6* 12.1 12.2  HCT 41.9 41.0 38.1 41.4 41.1  MCV 89.7 90.1 90.3 90.8 89.7  PLT 474* 470* 472* 547* 536*   Cardiac Enzymes: No results for input(s): CKTOTAL, CKMB, CKMBINDEX, TROPONINI in the last 168 hours. BNP: Invalid input(s): POCBNP CBG: Recent Labs  Lab 01/11/19 2157  GLUCAP 119*   D-Dimer No results for input(s): DDIMER in the last 72 hours. Hgb A1c No results for input(s): HGBA1C in the last 72 hours. Lipid Profile No results for input(s): CHOL, HDL, LDLCALC, TRIG, CHOLHDL, LDLDIRECT in the last 72 hours. Thyroid function studies No results for input(s): TSH, T4TOTAL, T3FREE, THYROIDAB in the last 72 hours.  Invalid input(s): FREET3 Anemia work up No results for input(s): VITAMINB12, FOLATE, FERRITIN, TIBC, IRON, RETICCTPCT in the last 72 hours. Urinalysis    Component Value Date/Time   COLORURINE AMBER (A) 01/03/2019 1050   APPEARANCEUR CLOUDY (A) 01/03/2019 1050   LABSPEC 1.030 01/03/2019 1050   PHURINE 5.0 01/03/2019 1050   GLUCOSEU NEGATIVE 01/03/2019 1050   HGBUR LARGE (A) 01/03/2019 1050   BILIRUBINUR NEGATIVE 01/03/2019 1050   KETONESUR NEGATIVE 01/03/2019 1050   PROTEINUR 100 (A) 01/03/2019 1050   UROBILINOGEN 1.0 06/04/2013 1250   NITRITE POSITIVE (A) 01/03/2019 1050   LEUKOCYTESUR MODERATE (A) 01/03/2019 1050   Sepsis Labs Invalid input(s): PROCALCITONIN,  WBC,  LACTICIDVEN Microbiology Recent Results (from the past 240 hour(s))  Culture, blood (routine x 2)     Status: Abnormal   Collection Time: 01/03/19  3:44 PM  Result Value Ref Range Status   Specimen Description   Final    BLOOD LEFT ARM Performed at [facility] [address], [place], [place] [address]    Special Requests   Final    BOTTLES DRAWN AEROBIC ONLY Blood Culture adequate volume Performed at [facility] [address], [place], [place] [address]    Culture  Setup Time   Final    GRAM NEGATIVE RODS AEROBIC BOTTLE ONLY CRITICAL RESULT CALLED TO, READ BACK BY AND VERIFIED WITH: S CHRISTY PHARMD 2025 01/04/19 A BROWNING Performed at [facility], [place] [address] [place], [place] [address]    Culture ESCHERICHIA COLI (A)  Final   Report Status 01/06/2019 FINAL  Final   Organism ID, Bacteria ESCHERICHIA  COLI  Final      Susceptibility   Escherichia coli - MIC*    AMPICILLIN <=2 SENSITIVE Sensitive     CEFAZOLIN <=4 SENSITIVE Sensitive     CEFEPIME <=1 SENSITIVE Sensitive     CEFTAZIDIME <=1 SENSITIVE Sensitive     CEFTRIAXONE <=1 SENSITIVE Sensitive     CIPROFLOXACIN >=4 RESISTANT Resistant     GENTAMICIN 4 SENSITIVE Sensitive     IMIPENEM <=0.25 SENSITIVE Sensitive     TRIMETH/SULFA <=20 SENSITIVE Sensitive     AMPICILLIN/SULBACTAM <=2 SENSITIVE Sensitive     PIP/TAZO <=4 SENSITIVE Sensitive     Extended ESBL NEGATIVE Sensitive     * ESCHERICHIA COLI  Culture, blood (routine x 2)     Status: None   Collection Time: 01/03/19  3:44 PM  Result Value Ref Range Status   Specimen Description   Final    BLOOD RIGHT HAND Performed at [place] [facility] [address], [place], [place] [address]    Special Requests   Final    BOTTLES DRAWN AEROBIC ONLY Blood Culture adequate volume Performed at [facility] [address], [place], [place] [address]    Culture   Final    NO GROWTH 5 DAYS Performed at [facility], [place] [address], [place], [place] [address]    Report Status 01/08/2019 FINAL  Final  Blood Culture ID Panel (Reflexed)     Status: Abnormal   Collection Time: 01/03/19  3:44 PM  Result Value Ref Range Status   Enterococcus species NOT DETECTED NOT DETECTED Final   Listeria monocytogenes NOT DETECTED NOT DETECTED Final   Staphylococcus species NOT DETECTED NOT DETECTED Final   Staphylococcus aureus (BCID) NOT DETECTED NOT DETECTED Final   Streptococcus species NOT DETECTED NOT DETECTED Final   Streptococcus agalactiae NOT DETECTED NOT DETECTED Final   Streptococcus pneumoniae NOT DETECTED NOT DETECTED Final   Streptococcus pyogenes NOT DETECTED NOT DETECTED Final   Acinetobacter baumannii NOT DETECTED NOT DETECTED Final   Enterobacteriaceae species DETECTED (A) NOT DETECTED Final    Comment: Enterobacteriaceae represent a large  family of gram-negative bacteria, not a single organism. CRITICAL RESULT CALLED TO, READ BACK BY AND VERIFIED WITH: S CHRISTY PHARMD 2025 01/04/19 A BROWNING    Enterobacter cloacae complex NOT DETECTED NOT DETECTED Final   Escherichia coli DETECTED (A) NOT DETECTED Final    Comment: CRITICAL RESULT CALLED TO, READ BACK BY AND VERIFIED WITH: S CHRISTY PHARMD 2025 01/04/19 A BROWNING    Klebsiella oxytoca NOT DETECTED NOT DETECTED Final   Klebsiella pneumoniae NOT DETECTED NOT DETECTED Final   Proteus species NOT DETECTED NOT DETECTED Final   Serratia marcescens NOT DETECTED NOT DETECTED Final   Carbapenem resistance NOT DETECTED NOT DETECTED Final   Haemophilus influenzae NOT DETECTED NOT DETECTED Final   Neisseria meningitidis NOT DETECTED NOT DETECTED Final   Pseudomonas aeruginosa NOT DETECTED NOT DETECTED Final   Candida albicans NOT DETECTED NOT DETECTED Final   Candida glabrata NOT DETECTED NOT DETECTED Final   Candida krusei NOT DETECTED NOT DETECTED Final   Candida parapsilosis NOT DETECTED NOT DETECTED Final   Candida tropicalis NOT DETECTED NOT DETECTED Final    Comment: Performed at [facility], [place]. [address], [place], [place] [address]  Culture, Urine     Status: Abnormal   Collection Time: 01/03/19  5:53 PM  Result Value Ref Range Status   Specimen Description   Final    CYSTOSCOPY RIGHT KIDNEY Performed at [facility] [address] [place], [place] [address]    Special Requests   Final    NONE Performed at [facility], [place] [address], [place], [place] [address]    Culture >=100,000 COLONIES/mL ESCHERICHIA COLI (A)  Final   Report Status 01/06/2019 FINAL  Final   Organism ID, Bacteria ESCHERICHIA COLI (A)  Final      Susceptibility   Escherichia coli - MIC*    AMPICILLIN 16 INTERMEDIATE Intermediate     CEFAZOLIN <=4 SENSITIVE Sensitive     CEFEPIME <=1 SENSITIVE Sensitive     CEFTAZIDIME <=1 SENSITIVE Sensitive      CEFTRIAXONE <=1 SENSITIVE Sensitive     CIPROFLOXACIN >=4 RESISTANT Resistant     GENTAMICIN <=1 SENSITIVE Sensitive     IMIPENEM <=0.25 SENSITIVE Sensitive     TRIMETH/SULFA <=20 SENSITIVE Sensitive     AMPICILLIN/SULBACTAM 4 SENSITIVE Sensitive     PIP/TAZO <=4 SENSITIVE Sensitive     Extended ESBL NEGATIVE Sensitive     * >=100,000 COLONIES/mL ESCHERICHIA COLI  Aerobic/Anaerobic Culture (surgical/deep wound)     Status: None (Preliminary result)   Collection Time: 01/10/19 11:03 AM  Result Value Ref Range Status   Specimen Description   Final    ABSCESS PERINEPHRIC DRAIN Performed at [facility], [address] [place], [place] [address]    Special Requests   Final    Normal Performed at [facility]  [place], [place] [address]    Gram Stain   Final    ABUNDANT WBC PRESENT, PREDOMINANTLY PMN MODERATE GRAM POSITIVE COCCI FEW GRAM POSITIVE RODS RARE GRAM NEGATIVE RODS    Culture   Final    FEW ESCHERICHIA COLI FEW BACTEROIDES THETAIOTAOMICRON BETA LACTAMASE POSITIVE Performed at [facility], [place] [address] [place], [place] [address]    Report Status PENDING  Incomplete   Organism ID, Bacteria ESCHERICHIA COLI  Final      Susceptibility   Escherichia coli - MIC*    AMPICILLIN <=2 SENSITIVE Sensitive     CEFAZOLIN <=4 SENSITIVE Sensitive     CEFEPIME <=1 SENSITIVE Sensitive     CEFTAZIDIME <=1 SENSITIVE Sensitive     CEFTRIAXONE <=1 SENSITIVE Sensitive     CIPROFLOXACIN >=4 RESISTANT Resistant     GENTAMICIN 2 SENSITIVE Sensitive     IMIPENEM <=0.25 SENSITIVE Sensitive     TRIMETH/SULFA <=20 SENSITIVE Sensitive     AMPICILLIN/SULBACTAM <=2 SENSITIVE Sensitive     PIP/TAZO <=4 SENSITIVE Sensitive     Extended ESBL NEGATIVE Sensitive     * FEW ESCHERICHIA COLI    SIGNED:   Cordelia Poche, MD [facility] 01/13/2019, 1:17 PM

## 2019-01-13 NOTE — Progress Notes (Signed)
Referring Physician(s): Manny,T  Supervising Physician: Markus Daft  Patient Status:  [facility] - In-pt  Chief Complaint: right flank pain/perinephric fluid collections   Subjective: Pt feeling better since abd drains placed yesterday; denies N/V   Allergies: Codeine; Hydrocodone; and Macrodantin [nitrofurantoin]  Medications:  Current Facility-Administered Medications:  .  0.9 %  sodium chloride infusion, , Intravenous, PRN, Mariel Aloe, MD, Stopped at 01/12/19 1735 .  acetaminophen (TYLENOL) tablet 650 mg, 650 mg, Oral, Q6H PRN, Georgette Shell, MD .  amitriptyline (ELAVIL) tablet 25 mg, 25 mg, Oral, QHS, Irine Seal, MD, 25 mg at 01/12/19 2034 .  apixaban (ELIQUIS) tablet 5 mg, 5 mg, Oral, BID, Georgette Shell, MD, 5 mg at 01/13/19 0835 .  carvedilol (COREG) tablet 3.125 mg, 3.125 mg, Oral, BID WC, Irine Seal, MD, 3.125 mg at 01/13/19 0835 .  cefTRIAXone (ROCEPHIN) 2 g in sodium chloride 0.9 % 100 mL IVPB, 2 g, Intravenous, Q24H, Georgette Shell, MD, Stopped at 01/12/19 1457 .  diltiazem (CARDIZEM CD) 24 hr capsule 120 mg, 120 mg, Oral, q morning - 10a, Irine Seal, MD, 120 mg at 01/13/19 0835 .  flecainide (TAMBOCOR) tablet 75 mg, 75 mg, Oral, BID, Irine Seal, MD, 75 mg at 01/13/19 0836 .  HYDROmorphone (DILAUDID) injection 0.5 mg, 0.5 mg, Intravenous, Q4H PRN, Irine Seal, MD, 0.5 mg at 01/12/19 0714 .  lactated ringers infusion, , Intravenous, Continuous, Irine Seal, MD, Last Rate: 75 mL/hr at 01/12/19 1800 .  loperamide (IMODIUM) capsule 2 mg, 2 mg, Oral, Q6H PRN, Georgette Shell, MD, 2 mg at 01/13/19 0542 .  ondansetron (ZOFRAN) injection 4 mg, 4 mg, Intravenous, Q6H PRN, Irine Seal, MD, 4 mg at 01/13/19 0735 .  ondansetron (ZOFRAN-ODT) disintegrating tablet 4 mg, 4 mg, Oral, Q6H PRN, Irine Seal, MD, 4 mg at 01/11/19 1756 .  oxyCODONE-acetaminophen (PERCOCET/ROXICET) 5-325 MG per tablet 1 tablet, 1 tablet, Oral, Q6H PRN, Georgette Shell, MD,  1 tablet at 01/12/19 1317 .  pantoprazole (PROTONIX) EC tablet 40 mg, 40 mg, Oral, Daily, Irine Seal, MD, 40 mg at 01/13/19 0835 .  polyethylene glycol (MIRALAX / GLYCOLAX) packet 17 g, 17 g, Oral, Daily, Irine Seal, MD, 17 g at 01/10/19 1111 .  promethazine (PHENERGAN) injection 12.5 mg, 12.5 mg, Intravenous, Q6H PRN, Georgette Shell, MD, 12.5 mg at 01/11/19 1139 .  saccharomyces boulardii (FLORASTOR) capsule 250 mg, 250 mg, Oral, BID, Georgette Shell, MD, 250 mg at 01/13/19 0835 .  senna-docusate (Senokot-S) tablet 2 tablet, 2 tablet, Oral, BID, Irine Seal, MD, 1 tablet at 01/11/19 0844 .  sodium chloride flush (NS) 0.9 % injection 5 mL, 5 mL, Intracatheter, Q8H, Sandi Mariscal, MD, 5 mL at 01/13/19 0543    Vital Signs: BP (!) 155/73 (BP Location: Right Arm)   Pulse 97   Temp 98.1 F (36.7 C) (Oral)   Resp 16   Ht 5\' 7"  (1.702 m)   Wt 93.3 kg   SpO2 95%   BMI 32.22 kg/m   Physical Exam rt abd drains intact, insertion sites ok, not sig tender Drain #1 output only 10 mL recorded. Drain #2 ouptut 200 mL yesterday, 70 mL so far today.   Imaging:  Ct Image Guided Fluid Drain By Catheter  Result Date: 01/10/2019 INDICATION: History of chronic right-sided double-J ureteral stent with recent difficulty exchange now with postprocedural abdominal pain and fevers with CT scan demonstrating complex right-sided perinephric fluid collection and overlying cellulitis Request made for CT-guided aspiration(s)  and/or drainage catheter(s) placement for infection source control purposes. EXAM: CT-GUIDED RIGHT-SIDED PERINEPHRIC RENAL ABSCESS DRAINAGE CATHETER PLACEMENT X2 COMPARISON:  CT abdomen pelvis-01/09/2019; 01/08/2019 MEDICATIONS: The patient is currently admitted to the hospital and receiving intravenous antibiotics. The antibiotics were administered within an appropriate time frame prior to the initiation of the procedure. ANESTHESIA/SEDATION: The patient's level of consciousness and  vital signs were monitored continuously by radiology nursing throughout the procedure under my direct supervision. Fentanyl 100 mcg IV; Versed 2 mg IV Moderate Sedation Time:  25 minutes CONTRAST:  None COMPLICATIONS: None immediate. PROCEDURE: Informed written consent was obtained from the patient after a discussion of the risks, benefits and alternatives to treatment. The patient was placed prone on the CT gantry and a pre procedural CT was performed re-demonstrating the known abscess/fluid collection within the right perinephric space with dominant component about the superior aspect of the right kidney measuring at least 10.9 x 9.3 cm (image 36, series 2 and dominant component about the caudal aspect of the right kidney measuring approximately 7.0 x 5.1 cm (image 65, series 2). The procedure was planned. A timeout was performed prior to the initiation of the procedure. The skin overlying the right mid back and flank was prepped and draped in the usual sterile fashion. The overlying soft tissues were anesthetized with 1% lidocaine with epinephrine. Appropriate trajectory towards both the cranial and caudal component of the perirenal fluid collection was planned with the use of a 22 gauge spinal needles. 18 gauge trocar needles were advanced into the abscess/fluid collections and a short Amplatz super stiff wire was coiled within both collections. Appropriate positioning was confirmed with a limited CT scan. The tract was serially dilated allowing placement of a 10 [place] all-purpose drainage catheters. Appropriate positioning was confirmed with a limited postprocedural CT scan. Approximately 140 cc of purulent fluid was aspirated from the dominant cranial component of the collection and additional 45 cc of purulent fluid was aspirated from the caudal component of the perinephric fluid collection. Both catheters were connected to JP bulbs and sutured in place. A dressing was placed. The patient tolerated the  procedure well without immediate post procedural complication. IMPRESSION: 1. Successful CT guided placement of a 10 [place] all purpose drain catheter into the dominant cranial component of the right-sided perinephric abscess with aspiration of 140 mL of purulent fluid. 2. Successful CT-guided placement of a 10 French all-purpose drainage catheter into the dominant caudal component of the right-sided perinephric abscess with aspiration of 45 cc of purulent fluid. 3. Representative sample from the aspirated fluid was capped and sent to the laboratory for analysis. Electronically Signed   By: Sandi Mariscal M.D.   On: 01/10/2019 12:10     Labs:  CBC: Recent Labs    01/10/19 0417 01/11/19 0346 01/12/19 0347 01/13/19 0405  WBC 20.3* 19.2* 16.5* 13.7*  HGB 12.5 11.6* 12.1 12.2  HCT 41.0 38.1 41.4 41.1  PLT 470* 472* 547* 536*    COAGS: Recent Labs    01/09/19 1659 01/10/19 0417 01/10/19 1923 01/11/19 0346  APTT 48* 41* 82* 130*    BMP: Recent Labs    01/10/19 0417 01/11/19 0346 01/12/19 0347 01/13/19 0405  NA 135 136 138 138  K 3.4* 3.1* 2.9* 3.4*  CL 98 99 100 100  CO2 27 30 30 29   GLUCOSE 74 88 100* 89  BUN 12 10 6* <5*  CALCIUM 7.7* 7.6* 7.9* 7.7*  CREATININE 0.56 0.65 0.61 0.57  GFRNONAA >60 >60 >60 >60  GFRAA >60 >60 >60 >60    LIVER FUNCTION TESTS: Recent Labs    01/03/19 1050 01/07/19 1136  BILITOT 1.2 0.9  AST 23 19  ALT 13 13  ALKPHOS 79 59  PROT 7.5 5.6*  ALBUMIN 3.7 2.1*    Assessment and Plan: Pt with hx chronic rt JJ stent with recent difficult exchange, subsequent development of rt flank cellulitis/rt perinephric fluid collections, s/p drain placements 3/16; afebrile WBC trending down. Drains functional, persistent output but trending down. Cx E. Coli. IR following. Too early to rescan at this time, probably early next week. Can be ordered as an outpt if she otherwise to be discharged.   Electronically Signed: Ascencion Dike, PA-C  01/13/2019, 9:36 AM   I spent a total of 15 minutes at the the patient's bedside AND on the patient's hospital floor or unit, greater than 50% of which was counseling/coordinating care for right perinephric fluid collection drains    Patient ID: [name] [name], female   DOB: 09-05-1941, 78 y.o.   MRN: [id number]

## 2019-01-13 NOTE — Discharge Instructions (Addendum)
[name] [name].  You were in the hospital because of a urinary tract infection and kidney infection with an abscess. You also had a blood infection. You were given IV antibiotics. I am discharging you with another 10 days of antibiotics. You will need to follow-up with interventional radiology with regard to your abscess and infection as your antibiotics may need to be extended. I spoke with the PA and he said they will contact you/schedule a repeat CT scan.

## 2019-01-13 NOTE — Care Management Important Message (Signed)
Important Message  Patient Details  Name: [name] [name] MRN: [id number] Date of Birth: 04-Nov-1940   Medicare Important Message Given:  Yes    Kerin Salen 01/13/2019, 10:26 AMImportant Message  Patient Details  Name: [name] [name] MRN: [id number] Date of Birth: 02-19-1941   Medicare Important Message Given:  Yes    Kerin Salen 01/13/2019, 10:26 AM

## 2019-01-15 DIAGNOSIS — J9601 Acute respiratory failure with hypoxia: Secondary | ICD-10-CM | POA: Diagnosis not present

## 2019-01-15 DIAGNOSIS — K5790 Diverticulosis of intestine, part unspecified, without perforation or abscess without bleeding: Secondary | ICD-10-CM | POA: Diagnosis not present

## 2019-01-15 DIAGNOSIS — Z9181 History of falling: Secondary | ICD-10-CM | POA: Diagnosis not present

## 2019-01-15 DIAGNOSIS — B9620 Unspecified Escherichia coli [E. coli] as the cause of diseases classified elsewhere: Secondary | ICD-10-CM | POA: Diagnosis not present

## 2019-01-15 DIAGNOSIS — N151 Renal and perinephric abscess: Secondary | ICD-10-CM | POA: Diagnosis not present

## 2019-01-15 DIAGNOSIS — I2510 Atherosclerotic heart disease of native coronary artery without angina pectoris: Secondary | ICD-10-CM | POA: Diagnosis not present

## 2019-01-15 DIAGNOSIS — I130 Hypertensive heart and chronic kidney disease with heart failure and stage 1 through stage 4 chronic kidney disease, or unspecified chronic kidney disease: Secondary | ICD-10-CM | POA: Diagnosis not present

## 2019-01-15 DIAGNOSIS — Z7901 Long term (current) use of anticoagulants: Secondary | ICD-10-CM | POA: Diagnosis not present

## 2019-01-15 DIAGNOSIS — I5032 Chronic diastolic (congestive) heart failure: Secondary | ICD-10-CM | POA: Diagnosis not present

## 2019-01-15 DIAGNOSIS — K219 Gastro-esophageal reflux disease without esophagitis: Secondary | ICD-10-CM | POA: Diagnosis not present

## 2019-01-15 DIAGNOSIS — Z436 Encounter for attention to other artificial openings of urinary tract: Secondary | ICD-10-CM | POA: Diagnosis not present

## 2019-01-15 DIAGNOSIS — E785 Hyperlipidemia, unspecified: Secondary | ICD-10-CM | POA: Diagnosis not present

## 2019-01-15 DIAGNOSIS — I480 Paroxysmal atrial fibrillation: Secondary | ICD-10-CM | POA: Diagnosis not present

## 2019-01-15 DIAGNOSIS — N136 Pyonephrosis: Secondary | ICD-10-CM | POA: Diagnosis not present

## 2019-01-15 DIAGNOSIS — N189 Chronic kidney disease, unspecified: Secondary | ICD-10-CM | POA: Diagnosis not present

## 2019-01-15 LAB — AEROBIC/ANAEROBIC CULTURE W GRAM STAIN (SURGICAL/DEEP WOUND): Special Requests: NORMAL

## 2019-01-16 DIAGNOSIS — N189 Chronic kidney disease, unspecified: Secondary | ICD-10-CM | POA: Diagnosis not present

## 2019-01-16 DIAGNOSIS — N136 Pyonephrosis: Secondary | ICD-10-CM | POA: Diagnosis not present

## 2019-01-16 DIAGNOSIS — Z7901 Long term (current) use of anticoagulants: Secondary | ICD-10-CM | POA: Diagnosis not present

## 2019-01-16 DIAGNOSIS — I5032 Chronic diastolic (congestive) heart failure: Secondary | ICD-10-CM | POA: Diagnosis not present

## 2019-01-16 DIAGNOSIS — B9620 Unspecified Escherichia coli [E. coli] as the cause of diseases classified elsewhere: Secondary | ICD-10-CM | POA: Diagnosis not present

## 2019-01-16 DIAGNOSIS — I130 Hypertensive heart and chronic kidney disease with heart failure and stage 1 through stage 4 chronic kidney disease, or unspecified chronic kidney disease: Secondary | ICD-10-CM | POA: Diagnosis not present

## 2019-01-19 ENCOUNTER — Other Ambulatory Visit: Payer: Self-pay | Admitting: *Deleted

## 2019-01-19 DIAGNOSIS — I5032 Chronic diastolic (congestive) heart failure: Secondary | ICD-10-CM | POA: Diagnosis not present

## 2019-01-19 DIAGNOSIS — Z7901 Long term (current) use of anticoagulants: Secondary | ICD-10-CM | POA: Diagnosis not present

## 2019-01-19 DIAGNOSIS — N189 Chronic kidney disease, unspecified: Secondary | ICD-10-CM | POA: Diagnosis not present

## 2019-01-19 DIAGNOSIS — I130 Hypertensive heart and chronic kidney disease with heart failure and stage 1 through stage 4 chronic kidney disease, or unspecified chronic kidney disease: Secondary | ICD-10-CM | POA: Diagnosis not present

## 2019-01-19 DIAGNOSIS — N136 Pyonephrosis: Secondary | ICD-10-CM | POA: Diagnosis not present

## 2019-01-19 DIAGNOSIS — B9620 Unspecified Escherichia coli [E. coli] as the cause of diseases classified elsewhere: Secondary | ICD-10-CM | POA: Diagnosis not present

## 2019-01-19 NOTE — Patient Outreach (Signed)
[facility] [facility]) Care Management  01/19/2019  Shandell Giovanni Esperanza 1941/09/08 [id number]   EMMI-general discharge from [place] Day # 4 Date: 01/18/19 1023 Red Alert Reason: Lost interest in things? Yes Sad/hopeless/anxious/empty? Yes    Insurance: Medicare [facility] admissions  ED visits in the last 6 months   Outreach attempt # 1 No answer. [facility] RN CM left HIPAA compliant voicemail message along with CM's contact info.   Plan: [facility] RN CM sent an unsuccessful outreach letter and scheduled this patient for another call attempt within 4 business days   Costa Jha L. Lavina Hamman, RN, BSN, [place] Coordinator Office number [phone] Mobile number [phone]  Main [facility] number [phone] Fax number [phone]

## 2019-01-19 NOTE — Patient Outreach (Signed)
[facility]) Care Management  01/19/2019  Guillermina Shaft Shakespeare Jul 11, 1941 [id number]   EMMI-general discharge from [place] Day # 4 Date: 01/18/19 1023 Red Alert Reason: Lost interest in things? Yes Sad/hopeless/anxious/empty? Yes  Insurance: Medicare and Blue cross and blue shield supplement    Transition of care services noted to be completed by primary care MD office staff-    Patient and her husband returned a call to [facility] RN CM Patient is able to verify HIPAA Reviewed and addressed EMMI red alert/referral to [facility] with patient She gave permission for Cm to speak with Mr Gaby as she was not feeling very well   Emmi They report that the EMMI documented answers were partially correct because she has begun to feel sad related to pain on her left back from the spine to her hip when she gets up from a sitting position  She has been seen by [facility] home health staff aide, RN and to be seen on 01/20/19 by [facility] PT  The [facility] RN was made aware of the pain she is experiencing only when she is attempting to get up to a standing position from her 3N1.. Mr Owen reports Clarene Critchley the [facility] RN stated she would notify Dr Helene Kelp about the s/s to see if a muscle relaxant could be called in for the patient.   She has been using heat to the site at intervals and has percocets, advil, tylenol and aspirin prn She prefers not to use the percocets as she reports they make her nauseated and she has to take prn Zofran with it. Cm also cautioned her about taking percocet on an empty stomach and its constipation side effects She was  Encouraged to take in fluids, stool softener and vegetables  CM provided her contact number and the number for the 24 hour nurse call center in case something is needed tonight   Mr Wilhide also voiced a concern with getting Mrs Lobos out of the car to get to an appointment in [place] within the next week -Dr Tresa Moore and interventional radiology.  He reports that his family will be assisting with a ramp on 01/20/19 and there is not an issue with getting to and in the car but an issue with getting out of the car related to her 2 abscess perinephric drains. He reports in [place] he is familiar with [facility] and has been informed that [facility] is providing limited services to dialysis patients    Social: Mrs Artola lives at home with her husband, Joneen Boers,  but has support of her daughter, Ebony Hail, son in law and granddaughters. The daughter is presently on isolation pending results of coronavirus testing. Daughter is working from her home  Mr [facility] provides transportation  Mrs Twiggs is needing assistance with all her care needs at this time. Mr Broadus reports she is not sleeping in her bed but is sleeping in her lift recliner for better access to 3n1.     Conditions: Admission 01/03/19 to 01/13/19 for UTI/perinephric abscess, E coli bacteremia, occlusion of uteral JJ stent resulting in right flank cellulitis, right perinephric fluid collectins- Drain placements x 2 on 01/10/19   Hx of chronic right JJ stent with recent difficult exchange,  Paroxysmal Atrial fibrillation on Eliquis, NSTEMI, CAD, Chronic diastolic CHF, congential vaginal enterocele, tendonitis, right ureteral stricture, recurrent ureteral obstruction post ureteral stent placement every 5 months, hyperlipidemia, GERD    Medications: denies concerns with taking medications as prescribed, affording medications,  side effects of medications and questions about medications  Appointments: 01/25/19 Dr Tresa Moore [facility]  02/09/19 cardiology  Dr Helene Kelp contact has been via telephone Follow up with interventional radiology outpatient- Dr Markus Daft or Odelia Gage (vascular * Interventional radiology specialists - a division of [facility] radiology   Advance Directives: Denies need for assist with advance directives   Consent: [facility] RN CM reviewed [facility] services with patient. Patient gave  verbal consent for services for telephonic [facility] RN CM and [facility] .   [facility] RN CM referred Mrs Milkovich to [facility] for assistance with transportation resource for an appointment to [place] [facility] scheduled on 01/25/19   Pt encouraged to return a call to [place] or 24 hour nurse call center prn  [facility] RN CM sent a successful outreach letter as discussed with [facility] brochure enclosed for review Cm cancelled the unsuccessful letter   Joelene Millin L. Lavina Hamman, RN, BSN, [place] Coordinator Office number [phone] Mobile number [phone]  Main [facility] number [phone] Fax number [phone]

## 2019-01-19 NOTE — Addendum Note (Signed)
Addended by: Barbaraann Faster on: 01/19/2019 07:55 PM   Modules accepted: Orders

## 2019-01-20 ENCOUNTER — Other Ambulatory Visit: Payer: Self-pay | Admitting: Urology

## 2019-01-20 ENCOUNTER — Other Ambulatory Visit: Payer: Self-pay | Admitting: *Deleted

## 2019-01-20 ENCOUNTER — Ambulatory Visit: Payer: Medicare Other | Admitting: *Deleted

## 2019-01-20 ENCOUNTER — Other Ambulatory Visit: Payer: Self-pay | Admitting: Interventional Radiology

## 2019-01-20 ENCOUNTER — Telehealth: Payer: Self-pay | Admitting: *Deleted

## 2019-01-20 ENCOUNTER — Other Ambulatory Visit: Payer: Self-pay

## 2019-01-20 DIAGNOSIS — K6819 Other retroperitoneal abscess: Secondary | ICD-10-CM

## 2019-01-20 NOTE — Telephone Encounter (Signed)
S/w pt husband to schedule drain f/u and he states she will need a lift she cannot walk and get on table. We do not have a lift at our facility. Mr. Schrecengost states she has a f/u with Dr. Tresa Moore next week. I will have her scheduled at the hospital when premitted.Cathren Harsh

## 2019-01-20 NOTE — Patient Outreach (Signed)
[facility]) Care Management  01/20/2019  [name] [name] [facility] 18-Mar-1941 [id number]   High priority referral received today regarding need for transportation to upcoming appointment with Dr. Tresa Moore at [facility] on 01/25/19 @ 1:15 PM.  BSW spoke with patient's spouse.  Per spouse, he is able to drive her to appointments but, due to patient's current condition, he has difficulty with transferring her in and out of the vehicle.  Unfortunately, [facility] is not an option for this appointment because they have limited out-of-county transport especially during this time of COVID-19.  BSW arranged transportation via [place] will follow up with patient/spouse within the next two weeks regarding patient's progress and spouse's ability to transport to future MD appointments.  Ronn Melena, BSW Social Worker [phone]

## 2019-01-20 NOTE — Patient Outreach (Addendum)
[facility]) Care Management  01/20/2019  Jimmi Sidener Fleeger 1941-07-27 [id number]   Care coordination  [facility] RN CM spoke with Adonis Brook at Dr Helene Kelp (primary Care MD) office to discuss the concerns Mrs Regis reported on 01/19/19 with pain on the left center back to her left hip especially when getting up from [place] confirms pt scheduled for a TOC call from the office and an office f/u visit call on 01/25/19 via telephone but she will inform the nurse also. Cm discussed also that CM would also inform urology  [facility] RN CM spoke with Mickel Baas at Dr Tresa Moore office about  the concerns Mrs Brix reported on 01/19/19 with pain on the left center back to her left hip especially when getting up from 3n1. Discussed interventions CM encouraged for pain relief. Mickel Baas agreed with interventions, states possible standard stent pain and will send a message to Dr Tresa Moore She confirms pt has an appointment to see Dr Tresa Moore at the [facility] office on 01/25/19 at 1330 but needs to be there at 29    [facility] RN CM spoke with Loletha Carrow about the husband concern with f/u with IR and discharge instructions indicates repeat CT scan per IR next week and no appointment listed Vickie confirms the office is closed until possible on Mar 16 2019 related to coronavirus precautions and only limited procedures being completed. She discussed generally for Mrs Rosal drains that would be checked in 2-4 weeks. The IR office will contact Mrs Folden when there is availability to be seen but encourages her to go to [facility] if emergencies. Cm was also encouraged to provide the contact information for the office prn so calls could be placed prn   [facility] RN CM spoke with Mr & Mrs Dominic. Patient is able to verify HIPAA. They report they are doing better this am, no longer sad, "in better spirits" and were able to get a very good night worth of rest" They report raising the 3n1 height resolved the pain Mrs Medine was having  on her left side. Mr Bango shared that [place], daughter has contact various transportation providers to include [facility] transportation. Ebony Hail reports [place] was not able to provide assist, "not go out side of the county" This will be shared with [facility]  [facility] RN CM reviewed the call information to the above MD offices to confirm the upcoming follow up to them from each office and the 01/25/19  1330 appointment with Dr Tresa Moore (that will be shared with [facility]) [facility] RN CM discussed the Interventional Radiology (IR) information and provided the contact information for [facility] to include the number, address, Markus Daft, Sandi Mariscal, Darrell Allred. Cm discussed in case of emergencies related to Mrs Fodge's stent she is to go to [facility] as advised by IR.  Mr Windsor voiced understanding and appreciation of the follow up and information provided   [facility] RN CM will close case at this time as patient has been assessed and no needs identified/needs resolved.   Pt encouraged to return a call to [place] and the nurse call center prn  [facility] RN CM will updated [facility] about transportation for 01/25/19 appointment  Routed note to MDs  Joelene Millin L. Lavina Hamman, RN, BSN, [place] Coordinator Office number [phone] Mobile number [phone]  Main [facility] number [phone] Fax number [phone]

## 2019-01-21 ENCOUNTER — Other Ambulatory Visit: Payer: Self-pay

## 2019-01-21 DIAGNOSIS — I130 Hypertensive heart and chronic kidney disease with heart failure and stage 1 through stage 4 chronic kidney disease, or unspecified chronic kidney disease: Secondary | ICD-10-CM | POA: Diagnosis not present

## 2019-01-21 DIAGNOSIS — N136 Pyonephrosis: Secondary | ICD-10-CM | POA: Diagnosis not present

## 2019-01-21 DIAGNOSIS — I5032 Chronic diastolic (congestive) heart failure: Secondary | ICD-10-CM | POA: Diagnosis not present

## 2019-01-21 DIAGNOSIS — Z7901 Long term (current) use of anticoagulants: Secondary | ICD-10-CM | POA: Diagnosis not present

## 2019-01-21 DIAGNOSIS — N189 Chronic kidney disease, unspecified: Secondary | ICD-10-CM | POA: Diagnosis not present

## 2019-01-21 DIAGNOSIS — B9620 Unspecified Escherichia coli [E. coli] as the cause of diseases classified elsewhere: Secondary | ICD-10-CM | POA: Diagnosis not present

## 2019-01-21 NOTE — Patient Outreach (Signed)
[facility]) Care Management  01/21/2019  [name] [name] 1941/05/15 [id number]   Outgoing call to patient's spouse to confirm transportation arranged through [place] for appointment with Dr. Tresa Moore on 01/25/19.  Spouse reported that they have actually decided to rent a van for the time being.  He feels that he will be able to transfer patient in and out of a Lucianne Lei verses a car.  Spouse declined transportation assistance at this time.  BSW closing case but encouraged him to call if additional needs arise.  Ronn Melena, BSW Social Worker [phone]

## 2019-01-22 ENCOUNTER — Emergency Department (HOSPITAL_COMMUNITY): Payer: Medicare Other

## 2019-01-22 ENCOUNTER — Emergency Department (HOSPITAL_COMMUNITY)
Admission: EM | Admit: 2019-01-22 | Discharge: 2019-01-22 | Disposition: A | Discharge status: Home / Self Care | Payer: Medicare Other | Attending: Emergency Medicine | Admitting: Emergency Medicine

## 2019-01-22 ENCOUNTER — Encounter (HOSPITAL_COMMUNITY): Payer: Self-pay | Admitting: Emergency Medicine

## 2019-01-22 DIAGNOSIS — X501XXA Overexertion from prolonged static or awkward postures, initial encounter: Secondary | ICD-10-CM | POA: Insufficient documentation

## 2019-01-22 DIAGNOSIS — S3992XA Unspecified injury of lower back, initial encounter: Secondary | ICD-10-CM | POA: Diagnosis present

## 2019-01-22 DIAGNOSIS — Y93E8 Activity, other personal hygiene: Secondary | ICD-10-CM | POA: Diagnosis not present

## 2019-01-22 DIAGNOSIS — Y999 Unspecified external cause status: Secondary | ICD-10-CM | POA: Diagnosis not present

## 2019-01-22 DIAGNOSIS — Y92002 Bathroom of unspecified non-institutional (private) residence single-family (private) house as the place of occurrence of the external cause: Secondary | ICD-10-CM | POA: Diagnosis not present

## 2019-01-22 DIAGNOSIS — S32000A Wedge compression fracture of unspecified lumbar vertebra, initial encounter for closed fracture: Secondary | ICD-10-CM | POA: Diagnosis not present

## 2019-01-22 DIAGNOSIS — Z7901 Long term (current) use of anticoagulants: Secondary | ICD-10-CM | POA: Insufficient documentation

## 2019-01-22 DIAGNOSIS — I5032 Chronic diastolic (congestive) heart failure: Secondary | ICD-10-CM | POA: Diagnosis not present

## 2019-01-22 DIAGNOSIS — I252 Old myocardial infarction: Secondary | ICD-10-CM | POA: Diagnosis not present

## 2019-01-22 DIAGNOSIS — I2510 Atherosclerotic heart disease of native coronary artery without angina pectoris: Secondary | ICD-10-CM | POA: Diagnosis not present

## 2019-01-22 DIAGNOSIS — M545 Low back pain: Secondary | ICD-10-CM | POA: Diagnosis not present

## 2019-01-22 DIAGNOSIS — M5489 Other dorsalgia: Secondary | ICD-10-CM | POA: Diagnosis not present

## 2019-01-22 DIAGNOSIS — I110 Hypertensive heart disease with heart failure: Secondary | ICD-10-CM | POA: Diagnosis not present

## 2019-01-22 DIAGNOSIS — Z96649 Presence of unspecified artificial hip joint: Secondary | ICD-10-CM | POA: Insufficient documentation

## 2019-01-22 DIAGNOSIS — Z79899 Other long term (current) drug therapy: Secondary | ICD-10-CM | POA: Insufficient documentation

## 2019-01-22 DIAGNOSIS — R52 Pain, unspecified: Secondary | ICD-10-CM | POA: Diagnosis not present

## 2019-01-22 DIAGNOSIS — R0902 Hypoxemia: Secondary | ICD-10-CM | POA: Diagnosis not present

## 2019-01-22 DIAGNOSIS — S32010A Wedge compression fracture of first lumbar vertebra, initial encounter for closed fracture: Secondary | ICD-10-CM | POA: Diagnosis not present

## 2019-01-22 MED ORDER — METHOCARBAMOL 500 MG PO TABS
500.0000 mg | ORAL_TABLET | Freq: Once | ORAL | Status: AC
  Administered 2019-01-22: 500 mg via ORAL
  Filled 2019-01-22: qty 1

## 2019-01-22 MED ORDER — METHOCARBAMOL 500 MG PO TABS: 500.0000 mg | ORAL_TABLET | Freq: Two times a day (BID) | ORAL | 0 refills | Status: DC

## 2019-01-22 NOTE — ED Provider Notes (Signed)
[facility] DEPT Provider Note   CSN: [id number] Arrival date & time: 01/22/19  0500    History   Chief Complaint Chief Complaint  Patient presents with   Back Pain    HPI [name] [name] is a 78 y.o. female.     The history is provided by the patient and medical records.  Back Pain     78 year old female with history of A. fib on Eliquis, congestive heart failure, GERD, heart block, recent right renal abscess with drain in place, presenting to the ED with low back pain.  Patient reports 3 days ago she was using her bedside commode and when she turned to wipe something "pulled" in her low back.  Since then she has had pain in her low back, slightly worse on the left side.  She denies numbness or weakness of the legs, still able to walk around normally but pain in her back remains present.  No bowel or bladder incontinence.  She has been taking flexeril that she was given by her PCP which helps, but makes her very drowsy.  She also has home percocet but states "I don't like what it does to my mind".  She has hx of lumbar fusion in the past.  Of note, patient still has right nephrostomy in place for drainage of abscess.  This is not been giving her any issues.  She is continued urinating normally.  She has not noticed any fever or chills.  She is following up with neurology about this regularly.  Past Medical History:  Diagnosis Date   Abdominal aortic atherosclerosis ([place]) 05/07/2016   Noted on CT abd/pelvis   Anticoagulant long-term use    ELIQUIS   Atrial fibrillation with RVR (HCC)    Chronic diastolic (congestive) heart failure (HCC)    Coronary artery disease    Cystocele, midline    followed by dr c. Marvel Plan ([facility] [place])  pt uses pessary   Diverticulosis 05/07/2016   Noted on CT abd/pelvis   Dyspnea    w exertion d/t Afib per pt   First degree heart block    GERD (gastroesophageal reflux  disease)    Hematuria    intermittently d/t JJ stent   Hiatal hernia    History of arteriovenostomy for renal dialysis ([place])    History of colon polyps    History of gastric polyp    History of kidney stones    History of peptic ulcer disease    History of septic shock    2011 (pt unaware) and 06-04-2013  w/ acute renal failure   History of uterine cancer    STAGE I  --  S/P TAH W/ BSO  (NO OTHER TX)   History of ventilator dependency ([place]) 2014   Hydronephrosis, right 05/07/2016   Moderate, Noted on CT abd/pelvis   Hyperlipidemia    Hypertension    Iron deficiency anemia hematology/ oncology--- dr Bobby Rumpf at [facility] in [place]--- per lov note 08-07-2017  stabilized and felt to be more chronic anemia   01/ 2018  dx iron def. anemia  s/p  IV Iron infusion and taking oral iron supplement   PAF (paroxysmal atrial fibrillation) ([place]) DX JUNE 2012   CARDIOLOGIST--  DR Agustin Cree ([facility] , [place]--- that office has changed to [facility]))  CHADS2   Presence of pessary    for midline pessary   Ureteral stricture, right    has JJ stent  Wears glasses     Patient Active Problem List   Diagnosis Date Noted   Urinary tract infection without hematuria    Occlusion of ureteral stent (HCC)    Paroxysmal atrial fibrillation ([place]) 08/04/2017   Chronic anticoagulation 08/04/2017   Coronary artery disease 06/16/2017   Encounter for monitoring flecainide therapy 07/30/2016   Ureteral stricture, right 05/07/2016   Sepsis ([place]) 05/07/2016   Hydronephrosis 05/07/2016   Congenital vaginal enterocele 04/15/2016   Female genital prolapse 04/15/2016   High risk medication use 04/15/2016   Hyperlipemia 04/15/2016   Midline cystocele 04/15/2016   Palpitations 04/15/2016   Tendonitis, Achilles 04/15/2016   Chronic diastolic congestive heart failure ([place]) 05/31/2015   Dyslipidemia 05/31/2015   Diarrhea 06/11/2013    E coli bacteremia 06/08/2013   Anemia, unspecified 06/08/2013   Atrial fibrillation with rapid ventricular response ([place]) 06/07/2013   NSTEMI, initial episode of care ([place]) 06/07/2013   Septic shock(785.52) 06/04/2013   Acute respiratory failure with hypoxia ([place]) 06/04/2013   Pyelonephritis 06/04/2013   Ureteral obstruction, right 06/04/2013   Acute renal failure ([place]) 06/04/2013   Lactic acidosis 06/04/2013    Past Surgical History:  Procedure Laterality Date   CARDIOVERSION  09/ 2018   dr Agustin Cree   successful ([facility] )   CHOLECYSTECTOMY  2010   COLONOSCOPY W/ POLYPECTOMY  05/2017   CYSTO/  BALLOON DILATION RIGHT URETERAL STRICTURE/ STENT PLACEMENT  06-02-2013   CYSTO/  BILATERAL RETROGRADE PYELOGRAM/ RIGHT URETEROSCOPY AND STENT PLACEMENT  10-04-2005   CYSTO/ LEFT URETEROSCOPIC STONE EXTRACTION  04-03-2006   CYSTOSCOPY W/ RETROGRADES Right 01/03/2019   Procedure: CYSTOSCOPY WITH RETROGRADE PYELOGRAM RIGHT WITH STENT EXCHANGE;  Surgeon: Irine Seal, MD;  Location: [facility];  Service: Urology;  Laterality: Right;   CYSTOSCOPY W/ URETERAL STENT PLACEMENT Right 02/06/2014   Procedure: CYSTOSCOPY WITH RIGHT RETROGRADE PYELOGRAM RIGHT STENT REMOVAL and REPLACEMENT, Right Ureteroscopy;  Surgeon: Ailene Rud, MD;  Location[facility];  Service: Urology;  Laterality: Right;   CYSTOSCOPY W/ URETERAL STENT PLACEMENT Right 11/26/2016   Procedure: CYSTOSCOPY WITH RETROGRADE PYELOGRAM/URETERAL STENT REPLACEMENT;  Surgeon: Alexis Frock, MD;  Location: [facility];  Service: Urology;  Laterality: Right;   CYSTOSCOPY W/ URETERAL STENT PLACEMENT Right 04/24/2017   Procedure: CYSTOSCOPY WITH RETROGRADE PYELOGRAM/URETERAL STENT REPLACEMENT;  Surgeon: Alexis Frock, MD;  Location: [facility];  Service: Urology;  Laterality: Right;   CYSTOSCOPY W/ URETERAL STENT PLACEMENT Right 10/07/2017   Procedure: CYSTOSCOPY WITH RETROGRADE  PYELOGRAM/URETERAL STENT EXCHANGE;  Surgeon: Alexis Frock, MD;  Location: [facility];  Service: Urology;  Laterality: Right;   CYSTOSCOPY W/ URETERAL STENT PLACEMENT Right 03/03/2018   Procedure: CYSTOSCOPY WITH RIGHT RETROGRADE / RIGHT URETERAL STENT EXCHANGE;  Surgeon: Alexis Frock, MD;  Location: [facility];  Service: Urology;  Laterality: Right;   CYSTOSCOPY W/ URETERAL STENT PLACEMENT Right 09/22/2018   Procedure: CYSTOSCOPY WITH RETROGRADE PYELOGRAM/URETERAL STENT PLACEMENT;  Surgeon: Alexis Frock, MD;  Location: [facility];  Service: Urology;  Laterality: Right;  45 MINS   CYSTOSCOPY WITH LITHOLAPAXY N/A 11/21/2013   Procedure: CYSTOSCOPY WITH LITHOLAPAXY;  Surgeon: Ailene Rud, MD;  Location: [facility];  Service: Urology;  Laterality: N/A;   CYSTOSCOPY WITH RETROGRADE PYELOGRAM, URETEROSCOPY AND STENT PLACEMENT Bilateral 03/15/2014   Procedure: CYSTOSCOPY WITH BILATERAL RETROGRADE PYELOGRAM, RIGHT DIAGNOSTIC URETEROSCOPY AND STENT EXCHANGE;  Surgeon: Alexis Frock, MD;  Location[facility];  Service: Urology;  Laterality: Bilateral;   CYSTOSCOPY WITH STENT PLACEMENT Right  05/07/2016   Procedure: CYSTOSCOPY WITH RETROGRADE PYELOGRAM, URETERAL STENT PLACEMENT;  Surgeon: Franchot Gallo, MD;  Location: [facility];  Service: Urology;  Laterality: Right;   HEMIARTHROPLASTY HIP Right 11/2016   fractured   JOINT REPLACEMENT Right 11/2016   Hip Replacement   LUMBAR FUSION  JULY 2013   NEPHROLITHOTOMY  X2 YRS AGO   PERCUTANEOUS NEPHROSTOMY  2014   TOTAL ABDOMINAL HYSTERECTOMY W/ BILATERAL SALPINGOOPHORECTOMY  1989   TOTAL KNEE ARTHROPLASTY Bilateral 1992  &  [place] ECHOCARDIOGRAM  07-24-2017   dr Agustin Cree   ef 60-65% (improved from last echo 2014, was 45-50%)/  trace TR/  mild AV sclerosis without stenosis   URETEROSCOPY Right 11/21/2013   Procedure: URETEROSCOPY WITH STENT REMOVAL AND REPLACEMENT;  Surgeon:  Ailene Rud, MD;  Location: [facility];  Service: Urology;  Laterality: Right;     OB History   No obstetric history on file.      Home Medications    Prior to Admission medications   Medication Sig Start Date End Date Taking? Authorizing Provider  acetaminophen (TYLENOL) 500 MG tablet Take 1,000 mg by mouth 2 (two) times daily as needed for moderate pain or headache.    [provider]  amitriptyline (ELAVIL) 25 MG tablet Take 25 mg by mouth at bedtime.    [provider]  carvedilol (COREG) 6.25 MG tablet TAKE 1/2 TABLET BY MOUTH TWICE DAILY 02/22/18   Park Liter, MD  cefdinir (OMNICEF) 300 MG capsule Take 1 capsule (300 mg total) by mouth 2 (two) times daily for 10 days. 01/13/19 01/23/19  Mariel Aloe, MD  clotrimazole-betamethasone (LOTRISONE) cream Apply 1 application topically 2 (two) times daily as needed for rash. 11/17/18   [provider]  diltiazem (CARDIZEM CD) 120 MG 24 hr capsule Take 1 capsule (120 mg total) by mouth every morning. Patient taking differently: Take 120 mg by mouth every morning.  07/28/17   Park Liter, MD  ELIQUIS 5 MG TABS tablet Take 1 tablet (5 mg total) by mouth 2 (two) times daily. Patient taking differently: Take 5 mg by mouth 2 (two) times daily.  12/29/18   Park Liter, MD  ferrous sulfate 325 (65 FE) MG EC tablet Take 325 mg by mouth 2 (two) times daily.     [provider]  flecainide (TAMBOCOR) 50 MG tablet Take 1.5 tablets (75 mg total) by mouth 2 (two) times daily. 11/08/18   Park Liter, MD  ketoconazole (NIZORAL) 2 % shampoo Apply 1 application topically daily as needed for rash. 10/26/18   [provider]  omeprazole (PRILOSEC) 40 MG capsule Take 40 mg by mouth every morning.     [provider]  ondansetron (ZOFRAN-ODT) 4 MG disintegrating tablet Take 4 mg by mouth every 6 (six) hours as needed for nausea/vomiting. 11/16/18   [provider]  oxyCODONE-acetaminophen (PERCOCET/ROXICET) 5-325 MG tablet Take 1 tablet by mouth every 6 (six) hours as needed for moderate pain. 01/13/19   Mariel Aloe, MD  polyethylene glycol (MIRALAX / GLYCOLAX) packet Take 17 g by mouth daily as needed for mild constipation. 01/13/19   Mariel Aloe, MD  potassium chloride (K-DUR) 10 MEQ tablet Take 1 tablet (10 mEq total) by mouth as needed. When you take Lasix. 08/04/17 10/06/19  Park Liter, MD  triamcinolone cream (KENALOG) 0.1 % Apply 1 application topically 2 (two) times daily as needed for rash. 11/13/18   [provider]    Family History Family History  Problem Relation Age of Onset   Heart disease Mother    Heart failure Mother    Cancer Mother    Heart disease Father    Heart failure Father     Social History Social History   Tobacco Use   Smoking status: Never Smoker   Smokeless tobacco: Never Used  Substance Use Topics   Alcohol use: No   Drug use: No     Allergies   Codeine; Hydrocodone; and Macrodantin [nitrofurantoin]   Review of Systems Review of Systems  Musculoskeletal: Positive for back pain.     Physical Exam Updated Vital Signs BP 118/82 (BP Location: Left Arm)    Pulse 98    Temp 98.2 F (36.8 C) (Oral)    Resp 18    Ht 5\' 7"  (1.702 m)    Wt 93 kg    SpO2 95% Comment: 2L/min [place]   BMI 32.11 kg/m   Physical Exam Vitals signs and nursing note reviewed.  Constitutional:      Appearance: She is well-developed.  HENT:     Head: Normocephalic and atraumatic.  Eyes:     Conjunctiva/sclera: Conjunctivae normal.     Pupils: Pupils are equal, round, and reactive to light.  Neck:     Musculoskeletal: Normal range of motion.  Cardiovascular:     Rate and Rhythm: Normal rate and regular rhythm.     Heart sounds: Normal heart sounds.  Pulmonary:     Effort: Pulmonary effort is normal.     Breath sounds: Normal breath sounds.  Abdominal:     General: Bowel sounds  are normal.     Palpations: Abdomen is soft.     Comments: Right nephrostomy tube in place, site appears clean without signs of infection  Musculoskeletal: Normal range of motion.       Back:     Comments: Tenderness of the lumbar spine extending to the left, there is no appreciable spasm, no significant deformity, pain with range of motion, normal strength and sensation of both legs  Skin:    General: Skin is warm and dry.  Neurological:     Mental Status: She is alert and oriented to person, place, and time.      ED Treatments / Results  Labs (all labs ordered are listed, but only abnormal results are displayed) Labs Reviewed - No data to display  EKG None  Radiology Dg Lumbar Spine Complete  Result Date: 01/22/2019 CLINICAL DATA:  Initial evaluation for acute back pain for 3-4 days EXAM: LUMBAR SPINE - COMPLETE 4+ VIEW COMPARISON:  Prior CT from 01/09/2019. FINDINGS: Mild levoscoliosis with straightening of the normal lumbar lordosis, stable. No listhesis or malalignment. Compression deformity involving the L2 vertebral body, unchanged. There is increased concavity at the superior endplate of L1 from previous, which could reflect an acute endplate compression fracture. Mild 10-50% central height loss without retropulsion. Vertebral body heights otherwise maintained. Sacrum and pelvis intact. Patient is status post posterior fusion at L3 through L5. No hardware complication. Right-sided percutaneous nephrostomy tube with ureteral stent and pigtail drain noted. Cholecystectomy clips. No acute soft tissue abnormality. Right total hip arthroplasty noted.  Osteopenia. IMPRESSION: 1. Increase conchae cavity at the superior endplate of L1 as compared to previous CT from 01/09/2019, suspicious for acute compression fracture. Associated height loss of 15% without retropulsion. 2. Otherwise stable appearance of lumbar spine with no other acute abnormality. Posterior fusion at G9-Q1 without  complication.  Electronically Signed   By: Jeannine Boga M.D.   On: 01/22/2019 06:02    Procedures Procedures (including critical care time)  Medications Ordered in ED Medications  methocarbamol (ROBAXIN) tablet 500 mg (500 mg Oral Given 01/22/19 0549)     Initial Impression / Assessment and Plan / ED Course  I have reviewed the triage vital signs and the nursing notes.  Pertinent labs & imaging results that were available during my care of the patient were reviewed by me and considered in my medical decision making (see chart for details).  78 year old female presenting to the ED with low back pain.  This is been ongoing for a few days now.  States she was using her bedside commode and turned to the side to wipe when she felt a "pull" in her low back.  She has had ongoing pain since that time.  She is been taking Flexeril at home which helps but makes her very drowsy and sleepy.  She has not been taking her prescribed oxycodone.  She is afebrile and nontoxic in appearance here.  Tenderness of the lumbar spine with some extension to the left but no appreciable deformity or step-off.  Both legs are neurovascular intact.  She has not had any bowel or bladder incontinence.  She does have right nephrostomy in place from renal abscess, no issues with this and has followed up with urology.  Will obtain lumbar films.  Patient has not had any medications in over 12 hours.  Will give dose of Robaxin here to see how she tolerates.  X-ray concerning for possible acute L1 endplate compression fracture.  Estimated height loss of about 15%, no findings of retropulsion.  After Robaxin here, patient is much more comfortable.  She denies any drowsiness or dizziness, seems to have tolerated this better than Flexeril.  She remains neurologically intact without any signs or symptoms concerning for cauda equina.  No radicular symptoms.  I have discussed x-ray findings with her, she acknowledged understanding.   She will need to follow-up with her neurosurgeon sometime in the next few weeks for recheck.  She can continue Robaxin at home, will have her stop the Flexeril.  Advised that she may need to temporarily take some of her oxycodone if pain is not well controlled, can try reserving for nighttime use or taking half tablet at a time.  She was given signs or symptoms that would warrant emergent ED return including numbness or weakness of the legs, bowel or bladder incontinence, high fever, frequent falls, etc.  She acknowledged understanding and agreed with plan of care.   Final Clinical Impressions(s) / ED Diagnoses   Final diagnoses:  Compression fracture of lumbar vertebra, initial encounter, unspecified lumbar vertebral level [facility])    ED Discharge Orders    None       Larene Pickett, PA-C 01/22/19 0636    Varney Biles, MD 01/24/19 [phone]

## 2019-01-22 NOTE — ED Notes (Signed)
ED Provider at bedside. 

## 2019-01-22 NOTE — ED Notes (Signed)
Patient transported to X-ray 

## 2019-01-22 NOTE — ED Notes (Signed)
Husband  Coming to pick up patient.

## 2019-01-22 NOTE — ED Notes (Signed)
Bed: RP39 Expected date:  Expected time:  Means of arrival:  Comments: 54yrs EMS with complaint of back pain. Patient states it is moving furniture.

## 2019-01-22 NOTE — Discharge Instructions (Signed)
Take the prescribed medication as directed.  Stop taking the flexeril since it makes you drowsy. You may need to take some of your oxycodone.  Can take only at night, or trying taking half a tablet per dose Follow-up with your spine doctor-- sometime ideally in the next 2-3 weeks would be good. Return to the ED for new or worsening symptoms-- leg numbness/weakness, bowel or bladder incontinence, high fever, etc.

## 2019-01-22 NOTE — ED Triage Notes (Addendum)
Per EMS , pt. From home with complaint of back pain at 10/10 x 3-4 days . Pt. Stated that pain started when she was moving/ transferring  from her bed to potty. Denied fall. Pt. Stated that she was seen here on the 9th of March for kidney infection. Pt. Still has #2 JP drains on right lower back. A/O x4. denied fever at home.

## 2019-01-24 DIAGNOSIS — N136 Pyonephrosis: Secondary | ICD-10-CM | POA: Diagnosis not present

## 2019-01-25 ENCOUNTER — Other Ambulatory Visit: Payer: Medicare Other

## 2019-01-25 DIAGNOSIS — B9620 Unspecified Escherichia coli [E. coli] as the cause of diseases classified elsewhere: Secondary | ICD-10-CM | POA: Diagnosis not present

## 2019-01-25 DIAGNOSIS — N189 Chronic kidney disease, unspecified: Secondary | ICD-10-CM | POA: Diagnosis not present

## 2019-01-25 DIAGNOSIS — N136 Pyonephrosis: Secondary | ICD-10-CM | POA: Diagnosis not present

## 2019-01-25 DIAGNOSIS — I5032 Chronic diastolic (congestive) heart failure: Secondary | ICD-10-CM | POA: Diagnosis not present

## 2019-01-25 DIAGNOSIS — I130 Hypertensive heart and chronic kidney disease with heart failure and stage 1 through stage 4 chronic kidney disease, or unspecified chronic kidney disease: Secondary | ICD-10-CM | POA: Diagnosis not present

## 2019-01-25 DIAGNOSIS — Z7901 Long term (current) use of anticoagulants: Secondary | ICD-10-CM | POA: Diagnosis not present

## 2019-01-27 DIAGNOSIS — I10 Essential (primary) hypertension: Secondary | ICD-10-CM | POA: Diagnosis not present

## 2019-01-27 DIAGNOSIS — R531 Weakness: Secondary | ICD-10-CM | POA: Diagnosis not present

## 2019-01-27 DIAGNOSIS — Z743 Need for continuous supervision: Secondary | ICD-10-CM | POA: Diagnosis not present

## 2019-01-27 DIAGNOSIS — R262 Difficulty in walking, not elsewhere classified: Secondary | ICD-10-CM | POA: Diagnosis not present

## 2019-01-27 DIAGNOSIS — R0902 Hypoxemia: Secondary | ICD-10-CM | POA: Diagnosis not present

## 2019-01-27 DIAGNOSIS — M4850XA Collapsed vertebra, not elsewhere classified, site unspecified, initial encounter for fracture: Secondary | ICD-10-CM | POA: Diagnosis not present

## 2019-01-27 DIAGNOSIS — Z6832 Body mass index (BMI) 32.0-32.9, adult: Secondary | ICD-10-CM | POA: Diagnosis not present

## 2019-01-27 DIAGNOSIS — R279 Unspecified lack of coordination: Secondary | ICD-10-CM | POA: Diagnosis not present

## 2019-01-27 DIAGNOSIS — L8991 Pressure ulcer of unspecified site, stage 1: Secondary | ICD-10-CM | POA: Diagnosis not present

## 2019-01-28 DIAGNOSIS — N189 Chronic kidney disease, unspecified: Secondary | ICD-10-CM | POA: Diagnosis not present

## 2019-01-28 DIAGNOSIS — N136 Pyonephrosis: Secondary | ICD-10-CM | POA: Diagnosis not present

## 2019-01-28 DIAGNOSIS — B9620 Unspecified Escherichia coli [E. coli] as the cause of diseases classified elsewhere: Secondary | ICD-10-CM | POA: Diagnosis not present

## 2019-01-28 DIAGNOSIS — I130 Hypertensive heart and chronic kidney disease with heart failure and stage 1 through stage 4 chronic kidney disease, or unspecified chronic kidney disease: Secondary | ICD-10-CM | POA: Diagnosis not present

## 2019-01-28 DIAGNOSIS — I5032 Chronic diastolic (congestive) heart failure: Secondary | ICD-10-CM | POA: Diagnosis not present

## 2019-01-28 DIAGNOSIS — Z7901 Long term (current) use of anticoagulants: Secondary | ICD-10-CM | POA: Diagnosis not present

## 2019-01-29 DIAGNOSIS — T85638A Leakage of other specified internal prosthetic devices, implants and grafts, initial encounter: Secondary | ICD-10-CM | POA: Diagnosis not present

## 2019-01-29 DIAGNOSIS — L03319 Cellulitis of trunk, unspecified: Secondary | ICD-10-CM | POA: Diagnosis not present

## 2019-01-29 DIAGNOSIS — N390 Urinary tract infection, site not specified: Secondary | ICD-10-CM | POA: Diagnosis not present

## 2019-01-29 DIAGNOSIS — I5032 Chronic diastolic (congestive) heart failure: Secondary | ICD-10-CM | POA: Diagnosis not present

## 2019-01-29 DIAGNOSIS — J90 Pleural effusion, not elsewhere classified: Secondary | ICD-10-CM | POA: Diagnosis not present

## 2019-01-29 DIAGNOSIS — I480 Paroxysmal atrial fibrillation: Secondary | ICD-10-CM | POA: Diagnosis not present

## 2019-01-29 DIAGNOSIS — J9691 Respiratory failure, unspecified with hypoxia: Secondary | ICD-10-CM | POA: Diagnosis not present

## 2019-01-29 DIAGNOSIS — R52 Pain, unspecified: Secondary | ICD-10-CM | POA: Diagnosis not present

## 2019-01-29 DIAGNOSIS — N1330 Unspecified hydronephrosis: Secondary | ICD-10-CM | POA: Diagnosis not present

## 2019-01-29 DIAGNOSIS — I5030 Unspecified diastolic (congestive) heart failure: Secondary | ICD-10-CM | POA: Diagnosis not present

## 2019-01-29 DIAGNOSIS — K219 Gastro-esophageal reflux disease without esophagitis: Secondary | ICD-10-CM | POA: Diagnosis not present

## 2019-01-29 DIAGNOSIS — R5381 Other malaise: Secondary | ICD-10-CM | POA: Diagnosis not present

## 2019-01-29 DIAGNOSIS — B9620 Unspecified Escherichia coli [E. coli] as the cause of diseases classified elsewhere: Secondary | ICD-10-CM | POA: Diagnosis not present

## 2019-01-29 DIAGNOSIS — Z4803 Encounter for change or removal of drains: Secondary | ICD-10-CM | POA: Diagnosis not present

## 2019-01-29 DIAGNOSIS — T83192D Other mechanical complication of urinary stent, subsequent encounter: Secondary | ICD-10-CM | POA: Diagnosis not present

## 2019-01-29 DIAGNOSIS — Z978 Presence of other specified devices: Secondary | ICD-10-CM | POA: Diagnosis not present

## 2019-01-29 DIAGNOSIS — K651 Peritoneal abscess: Secondary | ICD-10-CM | POA: Diagnosis not present

## 2019-01-29 DIAGNOSIS — R262 Difficulty in walking, not elsewhere classified: Secondary | ICD-10-CM | POA: Diagnosis not present

## 2019-01-29 DIAGNOSIS — A0472 Enterocolitis due to Clostridium difficile, not specified as recurrent: Secondary | ICD-10-CM | POA: Diagnosis not present

## 2019-01-29 DIAGNOSIS — K6819 Other retroperitoneal abscess: Secondary | ICD-10-CM | POA: Diagnosis not present

## 2019-01-29 DIAGNOSIS — R279 Unspecified lack of coordination: Secondary | ICD-10-CM | POA: Diagnosis not present

## 2019-01-29 DIAGNOSIS — N151 Renal and perinephric abscess: Secondary | ICD-10-CM | POA: Diagnosis not present

## 2019-01-29 DIAGNOSIS — I4891 Unspecified atrial fibrillation: Secondary | ICD-10-CM | POA: Diagnosis not present

## 2019-01-29 DIAGNOSIS — R188 Other ascites: Secondary | ICD-10-CM | POA: Diagnosis not present

## 2019-01-29 DIAGNOSIS — R1084 Generalized abdominal pain: Secondary | ICD-10-CM | POA: Diagnosis not present

## 2019-01-29 DIAGNOSIS — Z743 Need for continuous supervision: Secondary | ICD-10-CM | POA: Diagnosis not present

## 2019-01-29 DIAGNOSIS — E785 Hyperlipidemia, unspecified: Secondary | ICD-10-CM | POA: Diagnosis not present

## 2019-01-29 DIAGNOSIS — I700 Atherosclerosis of aorta: Secondary | ICD-10-CM | POA: Diagnosis not present

## 2019-01-30 ENCOUNTER — Other Ambulatory Visit: Payer: Self-pay | Admitting: Cardiology

## 2019-01-31 ENCOUNTER — Other Ambulatory Visit: Payer: Self-pay | Admitting: Urology

## 2019-02-01 ENCOUNTER — Other Ambulatory Visit (HOSPITAL_COMMUNITY): Payer: Self-pay | Admitting: Interventional Radiology

## 2019-02-01 ENCOUNTER — Telehealth (HOSPITAL_COMMUNITY): Payer: Self-pay

## 2019-02-01 ENCOUNTER — Telehealth: Payer: Self-pay | Admitting: Cardiology

## 2019-02-01 DIAGNOSIS — K6819 Other retroperitoneal abscess: Secondary | ICD-10-CM

## 2019-02-01 NOTE — Telephone Encounter (Signed)
1-4/15 °

## 2019-02-01 NOTE — Telephone Encounter (Signed)
Called to schedule drain eval, left message for [facility] to call back to schedule. AW

## 2019-02-01 NOTE — Telephone Encounter (Signed)
° °  Primary Cardiologist:  Agustin Cree  Patient contacted.  History reviewed.  No symptoms to suggest any unstable cardiac conditions.  Based on discussion, with current pandemic situation, we will be postponing this appointment for [facility] with a plan for f/u in 4-6 wks or sooner if feasible/necessary.  If symptoms change, she has been instructed to contact our office.    Calla Kicks  02/01/2019 11:36 AM         .

## 2019-02-02 DIAGNOSIS — I4891 Unspecified atrial fibrillation: Secondary | ICD-10-CM | POA: Diagnosis not present

## 2019-02-02 DIAGNOSIS — N151 Renal and perinephric abscess: Secondary | ICD-10-CM | POA: Diagnosis not present

## 2019-02-02 DIAGNOSIS — R262 Difficulty in walking, not elsewhere classified: Secondary | ICD-10-CM | POA: Diagnosis not present

## 2019-02-02 DIAGNOSIS — I5032 Chronic diastolic (congestive) heart failure: Secondary | ICD-10-CM | POA: Diagnosis not present

## 2019-02-03 ENCOUNTER — Ambulatory Visit: Payer: Self-pay

## 2019-02-04 ENCOUNTER — Encounter (HOSPITAL_COMMUNITY): Payer: Self-pay | Admitting: Interventional Radiology

## 2019-02-04 ENCOUNTER — Other Ambulatory Visit (HOSPITAL_COMMUNITY): Payer: Self-pay | Admitting: Interventional Radiology

## 2019-02-04 ENCOUNTER — Ambulatory Visit (HOSPITAL_COMMUNITY)
Admission: RE | Admit: 2019-02-04 | Discharge: 2019-02-04 | Disposition: A | Discharge status: Home / Self Care | Payer: Medicare Other | Source: Ambulatory Visit | Attending: Urology | Admitting: Urology

## 2019-02-04 ENCOUNTER — Other Ambulatory Visit: Payer: Self-pay

## 2019-02-04 ENCOUNTER — Ambulatory Visit (HOSPITAL_COMMUNITY)
Admission: RE | Admit: 2019-02-04 | Discharge: 2019-02-04 | Disposition: A | Discharge status: Home / Self Care | Payer: Medicare Other | Source: Ambulatory Visit | Attending: Interventional Radiology | Admitting: Interventional Radiology

## 2019-02-04 DIAGNOSIS — I700 Atherosclerosis of aorta: Secondary | ICD-10-CM | POA: Insufficient documentation

## 2019-02-04 DIAGNOSIS — K6819 Other retroperitoneal abscess: Secondary | ICD-10-CM

## 2019-02-04 DIAGNOSIS — K651 Peritoneal abscess: Secondary | ICD-10-CM | POA: Diagnosis not present

## 2019-02-04 DIAGNOSIS — Z4803 Encounter for change or removal of drains: Secondary | ICD-10-CM | POA: Insufficient documentation

## 2019-02-04 DIAGNOSIS — R188 Other ascites: Secondary | ICD-10-CM | POA: Diagnosis not present

## 2019-02-04 DIAGNOSIS — J90 Pleural effusion, not elsewhere classified: Secondary | ICD-10-CM | POA: Insufficient documentation

## 2019-02-04 HISTORY — PX: IR CATHETER TUBE CHANGE: IMG717

## 2019-02-04 MED ORDER — LIDOCAINE HCL 1 % IJ SOLN
INTRAMUSCULAR | Status: DC | PRN
  Administered 2019-02-04: 10 mL

## 2019-02-04 MED ORDER — IOHEXOL 300 MG/ML  SOLN
100.0000 mL | Freq: Once | INTRAMUSCULAR | Status: AC | PRN
  Administered 2019-02-04: 100 mL via INTRAVENOUS

## 2019-02-04 MED ORDER — IOHEXOL 300 MG/ML  SOLN
50.0000 mL | Freq: Once | INTRAMUSCULAR | Status: AC | PRN
  Administered 2019-02-04: 10 mL via INTRAVENOUS

## 2019-02-04 MED ORDER — LIDOCAINE HCL 1 % IJ SOLN
INTRAMUSCULAR | Status: AC
  Filled 2019-02-04: qty 20

## 2019-02-09 ENCOUNTER — Ambulatory Visit: Payer: Medicare Other | Admitting: Cardiology

## 2019-02-09 ENCOUNTER — Other Ambulatory Visit: Payer: Self-pay | Admitting: *Deleted

## 2019-02-09 NOTE — Patient Outreach (Addendum)
[name] Care Management  02/09/2019  [name] [name] Holm 1941-01-12 [id number]    Collaboration during IDT telephonic meeting with [facility] and facility team. Member currently at [facility] in [place] receiving therapy.  [name] [name] was outreached by [facility] team in the recent past. Sounds like the discharge plan is to return home with husband.   [name] [name] has a history of CHF, AFIB, HLD, GERD, compression fracture.   Will continue to collaborate with [facility] and facility. Will continue to follow for progression and disposition plans. Will make referral to [facility] closer to SNF discharge.    [name], MSN-Ed, RN,BSN [facility] Acute Care Coordinator [phone]

## 2019-02-10 ENCOUNTER — Other Ambulatory Visit (HOSPITAL_COMMUNITY): Payer: Self-pay | Admitting: Interventional Radiology

## 2019-02-10 DIAGNOSIS — L0291 Cutaneous abscess, unspecified: Secondary | ICD-10-CM

## 2019-02-11 ENCOUNTER — Encounter (HOSPITAL_COMMUNITY): Payer: Self-pay

## 2019-02-11 ENCOUNTER — Ambulatory Visit (HOSPITAL_COMMUNITY)
Admission: RE | Admit: 2019-02-11 | Discharge: 2019-02-11 | Disposition: A | Discharge status: Home / Self Care | Payer: Medicare Other | Source: Ambulatory Visit | Attending: Radiology | Admitting: Radiology

## 2019-02-11 ENCOUNTER — Telehealth: Payer: Self-pay | Admitting: Radiology

## 2019-02-11 ENCOUNTER — Other Ambulatory Visit: Payer: Self-pay

## 2019-02-11 ENCOUNTER — Other Ambulatory Visit (HOSPITAL_COMMUNITY): Payer: Self-pay | Admitting: Interventional Radiology

## 2019-02-11 ENCOUNTER — Ambulatory Visit (HOSPITAL_COMMUNITY)
Admission: RE | Admit: 2019-02-11 | Discharge: 2019-02-11 | Disposition: A | Discharge status: Home / Self Care | Payer: Medicare Other | Source: Ambulatory Visit | Attending: Interventional Radiology | Admitting: Interventional Radiology

## 2019-02-11 DIAGNOSIS — T85638A Leakage of other specified internal prosthetic devices, implants and grafts, initial encounter: Secondary | ICD-10-CM | POA: Diagnosis not present

## 2019-02-11 DIAGNOSIS — N151 Renal and perinephric abscess: Secondary | ICD-10-CM | POA: Insufficient documentation

## 2019-02-11 DIAGNOSIS — K651 Peritoneal abscess: Secondary | ICD-10-CM | POA: Diagnosis not present

## 2019-02-11 DIAGNOSIS — Z978 Presence of other specified devices: Secondary | ICD-10-CM | POA: Diagnosis not present

## 2019-02-11 DIAGNOSIS — L0291 Cutaneous abscess, unspecified: Secondary | ICD-10-CM

## 2019-02-11 DIAGNOSIS — Z4803 Encounter for change or removal of drains: Secondary | ICD-10-CM | POA: Insufficient documentation

## 2019-02-11 HISTORY — PX: IR CATHETER TUBE CHANGE: IMG717

## 2019-02-11 MED ORDER — IOHEXOL 300 MG/ML  SOLN: 10.0000 mL | Freq: Once | INTRAMUSCULAR | Status: DC | PRN

## 2019-02-11 MED ORDER — IOPAMIDOL (ISOVUE-300) INJECTION 61%
100.0000 mL | Freq: Once | INTRAVENOUS | Status: AC | PRN
  Administered 2019-02-11: 100 mL via INTRAVENOUS

## 2019-02-11 MED ORDER — LIDOCAINE HCL 1 % IJ SOLN
INTRAMUSCULAR | Status: AC
  Filled 2019-02-11: qty 20

## 2019-02-11 MED ORDER — LIDOCAINE HCL (PF) 1 % IJ SOLN
INTRAMUSCULAR | Status: DC | PRN
  Administered 2019-02-11: 5 mL

## 2019-02-11 NOTE — H&P (Signed)
Referring Physician(s): Dr Clint Lipps  Supervising Physician: Markus Daft  Patient Status:  [facility] OP  Chief Complaint:  perinephric abscess 2 drains placed 01/10/19   Subjective:  Most recent injection exchange 02/04/19 Off antibiotics since Sat 4/11 Has had new pain in right flank and catheters seem to be leaking per husband and SNF Scheduled now for re evaluation  Pt has eaten BF at 7 am Takes Eliquis daily-- LD this am  Allergies: Codeine; Hydrocodone; and Macrodantin [nitrofurantoin]  Medications: Prior to Admission medications   Medication Sig Start Date End Date Taking? Authorizing Provider  acetaminophen (TYLENOL) 500 MG tablet Take 1,000 mg by mouth 2 (two) times daily as needed for moderate pain or headache.    [provider]  amitriptyline (ELAVIL) 25 MG tablet Take 25 mg by mouth at bedtime.    [provider]  carvedilol (COREG) 6.25 MG tablet TAKE 1/2 TABLET BY MOUTH TWICE DAILY 01/31/19   Park Liter, MD  clotrimazole-betamethasone (LOTRISONE) cream Apply 1 application topically 2 (two) times daily as needed for rash. 11/17/18   [provider]  diltiazem (CARDIZEM CD) 120 MG 24 hr capsule Take 1 capsule (120 mg total) by mouth every morning. Patient taking differently: Take 120 mg by mouth every morning.  07/28/17   Park Liter, MD  ELIQUIS 5 MG TABS tablet Take 1 tablet (5 mg total) by mouth 2 (two) times daily. Patient taking differently: Take 5 mg by mouth 2 (two) times daily.  12/29/18   Park Liter, MD  ferrous sulfate 325 (65 FE) MG EC tablet Take 325 mg by mouth 2 (two) times daily.     [provider]  flecainide (TAMBOCOR) 50 MG tablet Take 1.5 tablets (75 mg total) by mouth 2 (two) times daily. 11/08/18   Park Liter, MD  ketoconazole (NIZORAL) 2 % shampoo Apply 1 application topically daily as needed for rash. 10/26/18   [provider]  methocarbamol (ROBAXIN) 500 MG tablet Take 1  tablet (500 mg total) by mouth 2 (two) times daily. 01/22/19   Larene Pickett, PA-C  omeprazole (PRILOSEC) 40 MG capsule Take 40 mg by mouth every morning.     [provider]  ondansetron (ZOFRAN-ODT) 4 MG disintegrating tablet Take 4 mg by mouth every 6 (six) hours as needed for nausea/vomiting. 11/16/18   [provider]  oxyCODONE-acetaminophen (PERCOCET/ROXICET) 5-325 MG tablet Take 1 tablet by mouth every 6 (six) hours as needed for moderate pain. 01/13/19   Mariel Aloe, MD  polyethylene glycol (MIRALAX / GLYCOLAX) packet Take 17 g by mouth daily as needed for mild constipation. 01/13/19   Mariel Aloe, MD  potassium chloride (K-DUR) 10 MEQ tablet Take 1 tablet (10 mEq total) by mouth as needed. When you take Lasix. 08/04/17 10/06/19  Park Liter, MD  triamcinolone cream (KENALOG) 0.1 % Apply 1 application topically 2 (two) times daily as needed for rash. 11/13/18   [provider]     Vital Signs: There were no vitals taken for this visit.  Physical Exam Vitals signs reviewed.  Cardiovascular:     Rate and Rhythm: Normal rate and regular rhythm.     Heart sounds: Normal heart sounds.  Abdominal:     General: Bowel sounds are normal.  Musculoskeletal: Normal range of motion.  Skin:    General: Skin is warm and dry.     Comments: Sites clean and dry No bleeding OP into bags is purulent--  milky yellow   Neurological:     Mental Status: She is alert and oriented to person, place, and time.  Psychiatric:        Mood and Affect: Mood normal.        Behavior: Behavior normal.        Thought Content: Thought content normal.        Judgment: Judgment normal.     Imaging: No results found.  Labs:  CBC: Recent Labs    01/10/19 0417 01/11/19 0346 01/12/19 0347 01/13/19 0405  WBC 20.3* 19.2* 16.5* 13.7*  HGB 12.5 11.6* 12.1 12.2  HCT 41.0 38.1 41.4 41.1  PLT 470* 472* 547* 536*    COAGS: Recent Labs    01/09/19 1659 01/10/19  0417 01/10/19 1923 01/11/19 0346  APTT 48* 41* 82* 130*    BMP: Recent Labs    01/10/19 0417 01/11/19 0346 01/12/19 0347 01/13/19 0405  NA 135 136 138 138  K 3.4* 3.1* 2.9* 3.4*  CL 98 99 100 100  CO2 27 30 30 29   GLUCOSE 74 88 100* 89  BUN 12 10 6* <5*  CALCIUM 7.7* 7.6* 7.9* 7.7*  CREATININE 0.56 0.65 0.61 0.57  GFRNONAA >60 >60 >60 >60  GFRAA >60 >60 >60 >60    LIVER FUNCTION TESTS: Recent Labs    01/03/19 1050 01/07/19 1136  BILITOT 1.2 0.9  AST 23 19  ALT 13 13  ALKPHOS 79 59  PROT 7.5 5.6*  ALBUMIN 3.7 2.1*    Assessment and Plan:  Perinephric abscess - RIGHT 2 drains intact Leaking per SNF Scheduled for evaluation; injection-- possible exchange-- possible new placement Pt is aware of procedure benefits and risks including but not limited to Infection; bleeding; damage to surrounding structures Agreeable to proceed Husband called this am-- he is also aware of procedure-- and agrees to move ahead Consent signed and n chart  Electronically Signed: Lavonia Drafts, PA-C 02/11/2019, 11:09 AM   I spent a total of 25 Minutes at the the patient's bedside AND on the patient's hospital floor or unit, greater than 50% of which was counseling/coordinating care for Rt perinephric abscess eval

## 2019-02-11 NOTE — Progress Notes (Signed)
Husband calls this am worried about pt States drain catheter is leaking at site (one of them) She is scheduled for drain evaluation- poss injections- possible exchanges  He feels she is not doing well She complains of pain at site all the time She finished antibiotics on Sat 4/11--- started being painful by 4/13  Requesting a called report to he or Dtr Ebony Hail after procedure Would even like to consider admission to Hospital if MD felt appropriate Advised pt he would need to cal Dr Tresa Moore with this request He understands and is agreeable   4/10: procedure -CATHETERS NEED TO BE FLUSHED WITH 3-5cc NORMAL SALINE EACH, B.I.D. Was seen in IR  INDICATION: Extensive right perinephric/retroperitoneal abscess status post percutaneous drain catheter placement x2 on 01/10/2019. She is having drainage around the cephalad drain at its skin entry site. She states the drains are not being flushed. She reports minimal output from either drain over the past few days.  HxIMPRESSION: 1. Technically successful exchange/revision of right retroperitoneal abscess drain catheters x2.

## 2019-02-11 NOTE — Progress Notes (Signed)
None

## 2019-02-17 ENCOUNTER — Other Ambulatory Visit: Payer: Self-pay | Admitting: *Deleted

## 2019-02-17 ENCOUNTER — Telehealth (HOSPITAL_COMMUNITY): Payer: Self-pay

## 2019-02-17 ENCOUNTER — Other Ambulatory Visit (HOSPITAL_COMMUNITY): Payer: Self-pay | Admitting: Diagnostic Radiology

## 2019-02-17 DIAGNOSIS — K6819 Other retroperitoneal abscess: Secondary | ICD-10-CM

## 2019-02-17 NOTE — Patient Outreach (Signed)
[facility]) Care Management  02/17/2019  [name] [name] 1941/04/20 [id number]   Spoke with [facility] UM RN. [name] remains in [facility]. Tentative discharge date from SNF is 02/20/19 to home with husband.   Discussed that writer will make [facility] referral once discharge date confirmed.   [name] has been outreached by [place] Management team in the recent past.   Will plan to make referral to [facility] for follow up post SNF discharge.   [name], MSN-Ed, RN,BSN [facility] Acute Care Coordinator [phone]

## 2019-02-17 NOTE — Telephone Encounter (Signed)
Called pt's husband to give him appt info for ct abd. He informed me that the pt is being discharged from [facility] on Sunday. I gave him all of the info for scan and he will bring pt. AW

## 2019-02-17 NOTE — Telephone Encounter (Signed)
Called [name] [name] to give pt's appt info. She was transporting a pt. .She will call back to get info when she returns to [place]. AW

## 2019-02-21 DIAGNOSIS — K219 Gastro-esophageal reflux disease without esophagitis: Secondary | ICD-10-CM | POA: Diagnosis not present

## 2019-02-21 DIAGNOSIS — I5032 Chronic diastolic (congestive) heart failure: Secondary | ICD-10-CM | POA: Diagnosis not present

## 2019-02-21 DIAGNOSIS — N1330 Unspecified hydronephrosis: Secondary | ICD-10-CM | POA: Diagnosis not present

## 2019-02-21 DIAGNOSIS — I700 Atherosclerosis of aorta: Secondary | ICD-10-CM | POA: Diagnosis not present

## 2019-02-21 DIAGNOSIS — N151 Renal and perinephric abscess: Secondary | ICD-10-CM | POA: Diagnosis not present

## 2019-02-21 DIAGNOSIS — M545 Low back pain: Secondary | ICD-10-CM | POA: Diagnosis not present

## 2019-02-21 DIAGNOSIS — S32008S Other fracture of unspecified lumbar vertebra, sequela: Secondary | ICD-10-CM | POA: Diagnosis not present

## 2019-02-21 DIAGNOSIS — Z6829 Body mass index (BMI) 29.0-29.9, adult: Secondary | ICD-10-CM | POA: Diagnosis not present

## 2019-02-21 DIAGNOSIS — E669 Obesity, unspecified: Secondary | ICD-10-CM | POA: Diagnosis not present

## 2019-02-21 DIAGNOSIS — N189 Chronic kidney disease, unspecified: Secondary | ICD-10-CM | POA: Diagnosis not present

## 2019-02-21 DIAGNOSIS — M810 Age-related osteoporosis without current pathological fracture: Secondary | ICD-10-CM | POA: Diagnosis not present

## 2019-02-21 DIAGNOSIS — M8580 Other specified disorders of bone density and structure, unspecified site: Secondary | ICD-10-CM | POA: Diagnosis not present

## 2019-02-21 DIAGNOSIS — E785 Hyperlipidemia, unspecified: Secondary | ICD-10-CM | POA: Diagnosis not present

## 2019-02-21 DIAGNOSIS — E46 Unspecified protein-calorie malnutrition: Secondary | ICD-10-CM | POA: Diagnosis not present

## 2019-02-21 DIAGNOSIS — J9691 Respiratory failure, unspecified with hypoxia: Secondary | ICD-10-CM | POA: Diagnosis not present

## 2019-02-21 DIAGNOSIS — E871 Hypo-osmolality and hyponatremia: Secondary | ICD-10-CM | POA: Diagnosis not present

## 2019-02-21 DIAGNOSIS — I480 Paroxysmal atrial fibrillation: Secondary | ICD-10-CM | POA: Diagnosis not present

## 2019-02-21 DIAGNOSIS — I4891 Unspecified atrial fibrillation: Secondary | ICD-10-CM | POA: Diagnosis not present

## 2019-02-21 DIAGNOSIS — G43109 Migraine with aura, not intractable, without status migrainosus: Secondary | ICD-10-CM | POA: Diagnosis not present

## 2019-02-21 DIAGNOSIS — N390 Urinary tract infection, site not specified: Secondary | ICD-10-CM | POA: Diagnosis not present

## 2019-02-21 DIAGNOSIS — I493 Ventricular premature depolarization: Secondary | ICD-10-CM | POA: Diagnosis not present

## 2019-02-21 DIAGNOSIS — B9620 Unspecified Escherichia coli [E. coli] as the cause of diseases classified elsewhere: Secondary | ICD-10-CM | POA: Diagnosis not present

## 2019-02-21 DIAGNOSIS — K2970 Gastritis, unspecified, without bleeding: Secondary | ICD-10-CM | POA: Diagnosis not present

## 2019-02-21 DIAGNOSIS — M5136 Other intervertebral disc degeneration, lumbar region: Secondary | ICD-10-CM | POA: Diagnosis not present

## 2019-02-21 DIAGNOSIS — K449 Diaphragmatic hernia without obstruction or gangrene: Secondary | ICD-10-CM | POA: Diagnosis not present

## 2019-02-21 DIAGNOSIS — A0472 Enterocolitis due to Clostridium difficile, not specified as recurrent: Secondary | ICD-10-CM | POA: Diagnosis not present

## 2019-02-21 DIAGNOSIS — I130 Hypertensive heart and chronic kidney disease with heart failure and stage 1 through stage 4 chronic kidney disease, or unspecified chronic kidney disease: Secondary | ICD-10-CM | POA: Diagnosis not present

## 2019-02-21 DIAGNOSIS — D72829 Elevated white blood cell count, unspecified: Secondary | ICD-10-CM | POA: Diagnosis not present

## 2019-02-22 DIAGNOSIS — N151 Renal and perinephric abscess: Secondary | ICD-10-CM | POA: Diagnosis not present

## 2019-02-22 DIAGNOSIS — A0472 Enterocolitis due to Clostridium difficile, not specified as recurrent: Secondary | ICD-10-CM | POA: Diagnosis not present

## 2019-02-22 DIAGNOSIS — I130 Hypertensive heart and chronic kidney disease with heart failure and stage 1 through stage 4 chronic kidney disease, or unspecified chronic kidney disease: Secondary | ICD-10-CM | POA: Diagnosis not present

## 2019-02-22 DIAGNOSIS — N390 Urinary tract infection, site not specified: Secondary | ICD-10-CM | POA: Diagnosis not present

## 2019-02-22 DIAGNOSIS — B9620 Unspecified Escherichia coli [E. coli] as the cause of diseases classified elsewhere: Secondary | ICD-10-CM | POA: Diagnosis not present

## 2019-02-22 DIAGNOSIS — J9691 Respiratory failure, unspecified with hypoxia: Secondary | ICD-10-CM | POA: Diagnosis not present

## 2019-02-23 ENCOUNTER — Other Ambulatory Visit: Payer: Self-pay | Admitting: *Deleted

## 2019-02-23 DIAGNOSIS — I5032 Chronic diastolic (congestive) heart failure: Secondary | ICD-10-CM

## 2019-02-23 NOTE — Patient Outreach (Signed)
[facility]) Care Management  02/23/2019  [name] [name] [birth year] [id number]   Confirmed member discharged to home with home health care in acuity. [name] [name] discharged on 02/20/19 from [facility].   She was previously outreached by [facility] Management team.   Will make referral to [facility] for follow up.  [name] [name] has medical history of CHF, AFIB, HLD, GERD.    [name], MSN-Ed, RN,BSN [place] Acute Care Coordinator [phone]

## 2019-02-24 DIAGNOSIS — N151 Renal and perinephric abscess: Secondary | ICD-10-CM | POA: Diagnosis not present

## 2019-02-24 DIAGNOSIS — B9620 Unspecified Escherichia coli [E. coli] as the cause of diseases classified elsewhere: Secondary | ICD-10-CM | POA: Diagnosis not present

## 2019-02-24 DIAGNOSIS — A0472 Enterocolitis due to Clostridium difficile, not specified as recurrent: Secondary | ICD-10-CM | POA: Diagnosis not present

## 2019-02-24 DIAGNOSIS — J9691 Respiratory failure, unspecified with hypoxia: Secondary | ICD-10-CM | POA: Diagnosis not present

## 2019-02-24 DIAGNOSIS — N390 Urinary tract infection, site not specified: Secondary | ICD-10-CM | POA: Diagnosis not present

## 2019-02-24 DIAGNOSIS — I130 Hypertensive heart and chronic kidney disease with heart failure and stage 1 through stage 4 chronic kidney disease, or unspecified chronic kidney disease: Secondary | ICD-10-CM | POA: Diagnosis not present

## 2019-02-25 ENCOUNTER — Other Ambulatory Visit: Payer: Self-pay

## 2019-02-25 ENCOUNTER — Ambulatory Visit (HOSPITAL_COMMUNITY)
Admission: RE | Admit: 2019-02-25 | Discharge: 2019-02-25 | Disposition: A | Discharge status: Home / Self Care | Payer: Medicare Other | Source: Ambulatory Visit | Attending: Diagnostic Radiology | Admitting: Diagnostic Radiology

## 2019-02-25 DIAGNOSIS — K6819 Other retroperitoneal abscess: Secondary | ICD-10-CM | POA: Insufficient documentation

## 2019-02-25 DIAGNOSIS — Z9889 Other specified postprocedural states: Secondary | ICD-10-CM | POA: Diagnosis not present

## 2019-02-25 DIAGNOSIS — K651 Peritoneal abscess: Secondary | ICD-10-CM | POA: Diagnosis not present

## 2019-02-25 MED ORDER — IOHEXOL 300 MG/ML  SOLN
100.0000 mL | Freq: Once | INTRAMUSCULAR | Status: AC | PRN
  Administered 2019-02-25: 11:00:00 100 mL via INTRAVENOUS

## 2019-02-25 NOTE — Progress Notes (Signed)
Referring Physician(s): Dr Clint Lipps  Supervising Physician: Markus Daft  Patient Status:  [facility] OP  Chief Complaint:  perinephric abscess 2 drains placed 01/10/19  Subjective:  Visit 02/11/19: IMPRESSION: 1. Successful decompression of the perinephric abscess along the superior aspect of the right kidney. The abscess collection was decompressed with vigorous irrigation, catheter manipulation and up sizing of the catheter to 14 [place]. 2. Irrigation of the smaller perinephric abscess drainage catheter.  Pt is feeling so much better today Husband says upper drain still with dark cloudy fluid OP Maybe 20 cc daily  Lower drain OP is little to none Sometimes doesn't count OP for 2-3 days OP is clear urine like  Pt denies fever or chills Drain sites are mildly tender to touch  Scheduled for CT today and poss drain removal   Allergies: Codeine; Hydrocodone; and Macrodantin [nitrofurantoin]  Medications: Prior to Admission medications   Medication Sig Start Date End Date Taking? Authorizing Provider  acetaminophen (TYLENOL) 500 MG tablet Take 1,000 mg by mouth 2 (two) times daily as needed for moderate pain or headache.    [provider]  amitriptyline (ELAVIL) 25 MG tablet Take 25 mg by mouth at bedtime.    [provider]  carvedilol (COREG) 6.25 MG tablet TAKE 1/2 TABLET BY MOUTH TWICE DAILY 01/31/19   Park Liter, MD  clotrimazole-betamethasone (LOTRISONE) cream Apply 1 application topically 2 (two) times daily as needed for rash. 11/17/18   [provider]  diltiazem (CARDIZEM CD) 120 MG 24 hr capsule Take 1 capsule (120 mg total) by mouth every morning. Patient taking differently: Take 120 mg by mouth every morning.  07/28/17   Park Liter, MD  ELIQUIS 5 MG TABS tablet Take 1 tablet (5 mg total) by mouth 2 (two) times daily. Patient taking differently: Take 5 mg by mouth 2 (two) times daily.  12/29/18   Park Liter, MD   ferrous sulfate 325 (65 FE) MG EC tablet Take 325 mg by mouth 2 (two) times daily.     [provider]  flecainide (TAMBOCOR) 50 MG tablet Take 1.5 tablets (75 mg total) by mouth 2 (two) times daily. 11/08/18   Park Liter, MD  ketoconazole (NIZORAL) 2 % shampoo Apply 1 application topically daily as needed for rash. 10/26/18   [provider]  methocarbamol (ROBAXIN) 500 MG tablet Take 1 tablet (500 mg total) by mouth 2 (two) times daily. 01/22/19   Larene Pickett, PA-C  omeprazole (PRILOSEC) 40 MG capsule Take 40 mg by mouth every morning.     [provider]  ondansetron (ZOFRAN-ODT) 4 MG disintegrating tablet Take 4 mg by mouth every 6 (six) hours as needed for nausea/vomiting. 11/16/18   [provider]  oxyCODONE-acetaminophen (PERCOCET/ROXICET) 5-325 MG tablet Take 1 tablet by mouth every 6 (six) hours as needed for moderate pain. 01/13/19   Mariel Aloe, MD  polyethylene glycol (MIRALAX / GLYCOLAX) packet Take 17 g by mouth daily as needed for mild constipation. 01/13/19   Mariel Aloe, MD  potassium chloride (K-DUR) 10 MEQ tablet Take 1 tablet (10 mEq total) by mouth as needed. When you take Lasix. 08/04/17 10/06/19  Park Liter, MD  triamcinolone cream (KENALOG) 0.1 % Apply 1 application topically 2 (two) times daily as needed for rash. 11/13/18   [provider]     Vital Signs: There were no vitals taken for this visit.  Physical Exam Skin:    General: Skin  is warm and dry.     Comments: Sites are clean and dry Mild redness to skin under bandages  OP of upper drain is dark/yellow; cloudy color OP of lower drain is clear yellowish  Both flush and aspirate well-- does cause saline to come out of site if over 5 cc injection     CT:  Abscess looking almost resolved at this point. Small amount of fluid noted near upper drain site per Dr Anselm Pancoast    Imaging: No results found.  Labs:  CBC: Recent Labs    01/10/19  0417 01/11/19 0346 01/12/19 0347 01/13/19 0405  WBC 20.3* 19.2* 16.5* 13.7*  HGB 12.5 11.6* 12.1 12.2  HCT 41.0 38.1 41.4 41.1  PLT 470* 472* 547* 536*    COAGS: Recent Labs    01/09/19 1659 01/10/19 0417 01/10/19 1923 01/11/19 0346  APTT 48* 41* 82* 130*    BMP: Recent Labs    01/10/19 0417 01/11/19 0346 01/12/19 0347 01/13/19 0405  NA 135 136 138 138  K 3.4* 3.1* 2.9* 3.4*  CL 98 99 100 100  CO2 27 30 30 29   GLUCOSE 74 88 100* 89  BUN 12 10 6* <5*  CALCIUM 7.7* 7.6* 7.9* 7.7*  CREATININE 0.56 0.65 0.61 0.57  GFRNONAA >60 >60 >60 >60  GFRAA >60 >60 >60 >60    LIVER FUNCTION TESTS: Recent Labs    01/03/19 1050 01/07/19 1136  BILITOT 1.2 0.9  AST 23 19  ALT 13 13  ALKPHOS 79 59  PROT 7.5 5.6*  ALBUMIN 3.7 2.1*    Assessment and Plan:  Rt perinephric abscess almost resolved per CT today Lower drain removed without complication per Dr Anselm Pancoast order Upper drain remains intact Will plan for re CT in 2 weeks and possible pull of upper drain then Pt and husband aware of plan and agreeable  Electronically Signed: Lavonia Drafts, PA-C 02/25/2019, 11:08 AM   I spent a total of 25 Minutes at the the patient's bedside AND on the patient's hospital floor or unit, greater than 50% of which was counseling/coordinating care for Rt perinephric abscess drain check

## 2019-02-25 NOTE — Patient Outreach (Addendum)
Telephone assessment:  Discharged from [facility] 02/18/2019 Abscess:  Primary Care MD does transition of care.   Placed call to patient and explained reason for call. Patient reports that she went to have her drains checked today and one drain was removed.  The other drain remains in placed with flushing and dressing changes by husband.  Patient reports home health nurse, PT and OT are active.  Denies any problems with medications at this time.  Reports saw primary MD this week.  Denies any pain.   CHF:  Reports she does not weigh. Reports she knows when she has ankle swelling to take a lasix and potassium.   Again reviewed [facility] program and service for patient. She reports she is involved with enough folks and thinks she is doing great. Offered to mail out a letter for future reference and she agreed.  Confirmed address.  PLAN: close case.  Mail successful outreach letter to patient. Send case closure to Md.   Tomasa Rand, RN, BSN, CEN [facility] [employer] [phone]

## 2019-02-28 ENCOUNTER — Other Ambulatory Visit (HOSPITAL_COMMUNITY): Payer: Self-pay | Admitting: Radiology

## 2019-02-28 ENCOUNTER — Other Ambulatory Visit: Payer: Self-pay | Admitting: Urology

## 2019-02-28 DIAGNOSIS — K6819 Other retroperitoneal abscess: Secondary | ICD-10-CM

## 2019-03-01 DIAGNOSIS — J9691 Respiratory failure, unspecified with hypoxia: Secondary | ICD-10-CM | POA: Diagnosis not present

## 2019-03-01 DIAGNOSIS — B9620 Unspecified Escherichia coli [E. coli] as the cause of diseases classified elsewhere: Secondary | ICD-10-CM | POA: Diagnosis not present

## 2019-03-01 DIAGNOSIS — A0472 Enterocolitis due to Clostridium difficile, not specified as recurrent: Secondary | ICD-10-CM | POA: Diagnosis not present

## 2019-03-01 DIAGNOSIS — I130 Hypertensive heart and chronic kidney disease with heart failure and stage 1 through stage 4 chronic kidney disease, or unspecified chronic kidney disease: Secondary | ICD-10-CM | POA: Diagnosis not present

## 2019-03-01 DIAGNOSIS — N151 Renal and perinephric abscess: Secondary | ICD-10-CM | POA: Diagnosis not present

## 2019-03-01 DIAGNOSIS — N390 Urinary tract infection, site not specified: Secondary | ICD-10-CM | POA: Diagnosis not present

## 2019-03-04 DIAGNOSIS — B9620 Unspecified Escherichia coli [E. coli] as the cause of diseases classified elsewhere: Secondary | ICD-10-CM | POA: Diagnosis not present

## 2019-03-04 DIAGNOSIS — J9691 Respiratory failure, unspecified with hypoxia: Secondary | ICD-10-CM | POA: Diagnosis not present

## 2019-03-04 DIAGNOSIS — N151 Renal and perinephric abscess: Secondary | ICD-10-CM | POA: Diagnosis not present

## 2019-03-04 DIAGNOSIS — A0472 Enterocolitis due to Clostridium difficile, not specified as recurrent: Secondary | ICD-10-CM | POA: Diagnosis not present

## 2019-03-04 DIAGNOSIS — N390 Urinary tract infection, site not specified: Secondary | ICD-10-CM | POA: Diagnosis not present

## 2019-03-04 DIAGNOSIS — I130 Hypertensive heart and chronic kidney disease with heart failure and stage 1 through stage 4 chronic kidney disease, or unspecified chronic kidney disease: Secondary | ICD-10-CM | POA: Diagnosis not present

## 2019-03-08 DIAGNOSIS — N151 Renal and perinephric abscess: Secondary | ICD-10-CM | POA: Diagnosis not present

## 2019-03-09 ENCOUNTER — Other Ambulatory Visit: Payer: Medicare Other

## 2019-03-09 ENCOUNTER — Encounter: Payer: Self-pay | Admitting: Radiology

## 2019-03-09 ENCOUNTER — Ambulatory Visit
Admission: RE | Admit: 2019-03-09 | Discharge: 2019-03-09 | Disposition: A | Discharge status: Home / Self Care | Payer: Medicare Other | Source: Ambulatory Visit | Attending: Urology | Admitting: Urology

## 2019-03-09 ENCOUNTER — Other Ambulatory Visit: Payer: Self-pay | Admitting: Urology

## 2019-03-09 ENCOUNTER — Ambulatory Visit
Admission: RE | Admit: 2019-03-09 | Discharge: 2019-03-09 | Disposition: A | Discharge status: Home / Self Care | Payer: Medicare Other | Source: Ambulatory Visit | Attending: Radiology | Admitting: Radiology

## 2019-03-09 DIAGNOSIS — N151 Renal and perinephric abscess: Secondary | ICD-10-CM | POA: Diagnosis not present

## 2019-03-09 DIAGNOSIS — K6819 Other retroperitoneal abscess: Secondary | ICD-10-CM

## 2019-03-09 HISTORY — PX: IR RADIOLOGIST EVAL & MGMT: IMG5224

## 2019-03-09 MED ORDER — IOPAMIDOL (ISOVUE-300) INJECTION 61%
100.0000 mL | Freq: Once | INTRAVENOUS | Status: AC | PRN
  Administered 2019-03-09: 100 mL via INTRAVENOUS

## 2019-03-09 NOTE — Progress Notes (Signed)
Referring Physician(s): Dr. Rachell Cipro  Chief Complaint: The patient is seen in follow up today s/p perinephritic abscess with drain placement 01/10/19  History of present illness: [name] [name] is a 78 year old female previously known to IR from right nephrostomy and stent placement in 2014.  She has a history of chronic right hydronephrosis and partial ureteral stricture with multiple stent exchanges, the latest being 01/03/2019.  She was  admitted to [facility] the same day with severe right flank pain, hydronephrosis, and possible obstructed right stent/sepsis pyelonephritis.  Imaging showed right perinephritic fluid collections. She underwent drain placement x2 and has been following in IR outpatient drain clinic since this time.  Her right lower drain was removed 5/1.  She returns to clinic today for evaluation of her remaining right upper drain. She currently denies fever, headache, chest pain, dyspnea, cough, nausea, vomiting. Her husband has been flushing the drain twice daily for her at home without issue.  Output has decreased significantly over the past several weeks.   Past Medical History:  Diagnosis Date  . Abdominal aortic atherosclerosis ([place]) 05/07/2016   Noted on CT abd/pelvis  . Anticoagulant long-term use    ELIQUIS  . Atrial fibrillation with RVR ([place])   . Chronic diastolic (congestive) heart failure ([place])   . Coronary artery disease   . Cystocele, midline    [facility]ed [facility] dr c. Marvel Plan ([facility] [place]  pt uses pessary  . Diverticulosis 05/07/2016   Noted on CT abd/pelvis  . Dyspnea    w exertion d/t Afib per pt  . First degree heart block   . GERD (gastroesophageal reflux disease)   . Hematuria    intermittently d/t JJ stent  . Hiatal hernia   . History of arteriovenostomy for renal dialysis ([place])   . History of colon polyps   . History of gastric polyp   . History of kidney stones   . History of peptic ulcer disease   . History  of septic shock    2011 (pt unaware) and 06-04-2013  w/ acute renal failure  . History of uterine cancer    STAGE I  --  S/P TAH W/ BSO  (NO OTHER TX)  . History of ventilator dependency ([place]) 2014  . Hydronephrosis, right 05/07/2016   Moderate, Noted on CT abd/pelvis  . Hyperlipidemia   . Hypertension   . Iron deficiency anemia hematology/ oncology--- dr Bobby Rumpf at [facility] in [place]--- per lov note 08-07-2017  stabilized and felt to be more chronic anemia   01/ 2018  dx iron def. anemia  s/p  IV Iron infusion and taking oral iron supplement  . PAF (paroxysmal atrial fibrillation) ([place]) DX JUNE 2012   CARDIOLOGIST--  DR Agustin Cree ([facility] , [place]--- that office has changed to [facility]))  CHADS2  . Presence of pessary    for midline pessary  . Ureteral stricture, right    has JJ stent  . Wears glasses     Past Surgical History:  Procedure Laterality Date  . CARDIOVERSION  09/ 2018   dr Agustin Cree   successful ([facility] )  . CHOLECYSTECTOMY  2010  . COLONOSCOPY W/ POLYPECTOMY  05/2017  . CYSTO/  BALLOON DILATION RIGHT URETERAL STRICTURE/ STENT PLACEMENT  06-02-2013  . CYSTO/  BILATERAL RETROGRADE PYELOGRAM/ RIGHT URETEROSCOPY AND STENT PLACEMENT  10-04-2005  . CYSTO/ LEFT URETEROSCOPIC STONE EXTRACTION  04-03-2006  . CYSTOSCOPY W/ RETROGRADES Right 01/03/2019   Procedure: CYSTOSCOPY WITH RETROGRADE  PYELOGRAM RIGHT WITH STENT EXCHANGE;  Surgeon: Irine Seal, MD;  Location: [facility];  Service: Urology;  Laterality: Right;  . CYSTOSCOPY W/ URETERAL STENT PLACEMENT Right 02/06/2014   Procedure: CYSTOSCOPY WITH RIGHT RETROGRADE PYELOGRAM RIGHT STENT REMOVAL and REPLACEMENT, Right Ureteroscopy;  Surgeon: Ailene Rud, MD;  Location: [facility];  Service: Urology;  Laterality: Right;  . CYSTOSCOPY W/ URETERAL STENT PLACEMENT Right 11/26/2016   Procedure: CYSTOSCOPY WITH RETROGRADE PYELOGRAM/URETERAL STENT REPLACEMENT;  Surgeon:  Alexis Frock, MD;  Location: [facility];  Service: Urology;  Laterality: Right;  . CYSTOSCOPY W/ URETERAL STENT PLACEMENT Right 04/24/2017   Procedure: CYSTOSCOPY WITH RETROGRADE PYELOGRAM/URETERAL STENT REPLACEMENT;  Surgeon: Alexis Frock, MD;  Location: [facility];  Service: Urology;  Laterality: Right;  . CYSTOSCOPY W/ URETERAL STENT PLACEMENT Right 10/07/2017   Procedure: CYSTOSCOPY WITH RETROGRADE PYELOGRAM/URETERAL STENT EXCHANGE;  Surgeon: Alexis Frock, MD;  Location: [facility];  Service: Urology;  Laterality: Right;  . CYSTOSCOPY W/ URETERAL STENT PLACEMENT Right 03/03/2018   Procedure: CYSTOSCOPY WITH RIGHT RETROGRADE / RIGHT URETERAL STENT EXCHANGE;  Surgeon: Alexis Frock, MD;  Location: [facility];  Service: Urology;  Laterality: Right;  . CYSTOSCOPY W/ URETERAL STENT PLACEMENT Right 09/22/2018   Procedure: CYSTOSCOPY WITH RETROGRADE PYELOGRAM/URETERAL STENT PLACEMENT;  Surgeon: Alexis Frock, MD;  Location: [facility];  Service: Urology;  Laterality: Right;  45 MINS  . CYSTOSCOPY WITH LITHOLAPAXY N/A 11/21/2013   Procedure: CYSTOSCOPY WITH LITHOLAPAXY;  Surgeon: Ailene Rud, MD;  Location[facility];  Service: Urology;  Laterality: N/A;  . CYSTOSCOPY WITH RETROGRADE PYELOGRAM, URETEROSCOPY AND STENT PLACEMENT Bilateral 03/15/2014   Procedure: CYSTOSCOPY WITH BILATERAL RETROGRADE PYELOGRAM, RIGHT DIAGNOSTIC URETEROSCOPY AND STENT EXCHANGE;  Surgeon: Alexis Frock, MD;  Location: [facility];  Service: Urology;  Laterality: Bilateral;  . CYSTOSCOPY WITH STENT PLACEMENT Right 05/07/2016   Procedure: CYSTOSCOPY WITH RETROGRADE PYELOGRAM, URETERAL STENT PLACEMENT;  Surgeon: Franchot Gallo, MD;  Location: [facility];  Service: Urology;  Laterality: Right;  . HEMIARTHROPLASTY HIP Right 11/2016   fractured  . IR CATHETER TUBE CHANGE  02/04/2019  . IR CATHETER TUBE CHANGE  02/04/2019  . IR CATHETER TUBE  CHANGE  02/11/2019  . JOINT REPLACEMENT Right 11/2016   Hip Replacement  . LUMBAR FUSION  JULY 2013  . NEPHROLITHOTOMY  X2 YRS AGO  . PERCUTANEOUS NEPHROSTOMY  2014  . TOTAL ABDOMINAL HYSTERECTOMY W/ BILATERAL SALPINGOOPHORECTOMY  1989  . TOTAL KNEE ARTHROPLASTY Bilateral 1992  &  1996  . TRANSTHORACIC ECHOCARDIOGRAM  07-24-2017   dr Agustin Cree   ef 60-65% (improved from last echo 2014, was 45-50%)/  trace TR/  mild AV sclerosis without stenosis  . URETEROSCOPY Right 11/21/2013   Procedure: URETEROSCOPY WITH STENT REMOVAL AND REPLACEMENT;  Surgeon: Ailene Rud, MD;  Location: [facility];  Service: Urology;  Laterality: Right;    Allergies: Codeine; Hydrocodone; and Macrodantin [nitrofurantoin]  Medications: Prior to Admission medications   Medication Sig Start Date End Date Taking? Authorizing Provider  acetaminophen (TYLENOL) 500 MG tablet Take 1,000 mg by mouth 2 (two) times daily as needed for moderate pain or headache.    [provider]  amitriptyline (ELAVIL) 25 MG tablet Take 25 mg by mouth at bedtime.    [provider]  carvedilol (COREG) 6.25 MG tablet TAKE 1/2 TABLET BY MOUTH TWICE DAILY 01/31/19   Park Liter, MD  clotrimazole-betamethasone (LOTRISONE) cream Apply 1 application topically 2 (two) times daily as  needed for rash. 11/17/18   [provider]  diltiazem (CARDIZEM CD) 120 MG 24 hr capsule Take 1 capsule (120 mg total) by mouth every morning. Patient taking differently: Take 120 mg by mouth every morning.  07/28/17   Park Liter, MD  ELIQUIS 5 MG TABS tablet Take 1 tablet (5 mg total) by mouth 2 (two) times daily. Patient taking differently: Take 5 mg by mouth 2 (two) times daily.  12/29/18   Park Liter, MD  ferrous sulfate 325 (65 FE) MG EC tablet Take 325 mg by mouth 2 (two) times daily.     [provider]  flecainide (TAMBOCOR) 50 MG tablet Take 1.5 tablets (75 mg total) by mouth 2 (two)  times daily. 11/08/18   Park Liter, MD  ketoconazole (NIZORAL) 2 % shampoo Apply 1 application topically daily as needed for rash. 10/26/18   [provider]  methocarbamol (ROBAXIN) 500 MG tablet Take 1 tablet (500 mg total) by mouth 2 (two) times daily. 01/22/19   Larene Pickett, PA-C  omeprazole (PRILOSEC) 40 MG capsule Take 40 mg by mouth every morning.     [provider]  ondansetron (ZOFRAN-ODT) 4 MG disintegrating tablet Take 4 mg by mouth every 6 (six) hours as needed for nausea/vomiting. 11/16/18   [provider]  oxyCODONE-acetaminophen (PERCOCET/ROXICET) 5-325 MG tablet Take 1 tablet by mouth every 6 (six) hours as needed for moderate pain. 01/13/19   Mariel Aloe, MD  polyethylene glycol (MIRALAX / GLYCOLAX) packet Take 17 g by mouth daily as needed for mild constipation. 01/13/19   Mariel Aloe, MD  potassium chloride (K-DUR) 10 MEQ tablet Take 1 tablet (10 mEq total) by mouth as needed. When you take Lasix. 08/04/17 10/06/19  Park Liter, MD  triamcinolone cream (KENALOG) 0.1 % Apply 1 application topically 2 (two) times daily as needed for rash. 11/13/18   [provider]     Family History  Problem Relation Age of Onset  . Heart disease Mother   . Heart failure Mother   . Cancer Mother   . Heart disease Father   . Heart failure Father     Social History   Socioeconomic History  . Marital status: Married    Spouse name: Not on file  . Number of children: Not on file  . Years of education: Not on file  . Highest education level: Not on file  Occupational History  . Occupation: Retired from [employer]  . Financial resource strain: Not on file  . Food insecurity:    Worry: Not on file    Inability: Not on file  . Transportation needs:    Medical: Not on file    Non-medical: Not on file  Tobacco Use  . Smoking status: Never Smoker  . Smokeless tobacco: Never Used  Substance and Sexual Activity  .  Alcohol use: No  . Drug use: No  . Sexual activity: Not on file  Lifestyle  . Physical activity:    Days per week: Not on file    Minutes per session: Not on file  . Stress: Not on file  Relationships  . Social connections:    Talks on phone: Not on file    Gets together: Not on file    Attends religious service: Not on file    Active member of club or organization: Not on file    Attends meetings of clubs or organizations: Not on file  Relationship status: Not on file  Other Topics Concern  . Not on file  Social History Narrative   Lives in [place] with husband. Drives, walks some, but not very active at home.      Vital Signs: BP 122/71   Pulse 78   Temp 98 F (36.7 C)   SpO2 98%   Physical Exam  NAD, alert Abdomen/Flank:  Soft, non-tender.  Drain in place in right flank.  Small amount of irritation related to drain, erythema.  No warmth or purulence.  Small amount of cloudy output in bulb.   Imaging: No results found.  Labs:  CBC: Recent Labs    01/10/19 0417 01/11/19 0346 01/12/19 0347 01/13/19 0405  WBC 20.3* 19.2* 16.5* 13.7*  HGB 12.5 11.6* 12.1 12.2  HCT 41.0 38.1 41.4 41.1  PLT 470* 472* 547* 536*    COAGS: Recent Labs    01/09/19 1659 01/10/19 0417 01/10/19 1923 01/11/19 0346  APTT 48* 41* 82* 130*    BMP: Recent Labs    01/10/19 0417 01/11/19 0346 01/12/19 0347 01/13/19 0405  NA 135 136 138 138  K 3.4* 3.1* 2.9* 3.4*  CL 98 99 100 100  CO2 27 30 30 29   GLUCOSE 74 88 100* 89  BUN 12 10 6* <5*  CALCIUM 7.7* 7.6* 7.9* 7.7*  CREATININE 0.56 0.65 0.61 0.57  GFRNONAA >60 >60 >60 >60  GFRAA >60 >60 >60 >60    LIVER FUNCTION TESTS: Recent Labs    01/03/19 1050 01/07/19 1136  BILITOT 1.2 0.9  AST 23 19  ALT 13 13  ALKPHOS 79 59  PROT 7.5 5.6*  ALBUMIN 3.7 2.1*    Assessment: Right perinephritic abscess s/p drain placement x2 01/10/19. Patient's collections have been slowly resolving.  Her lower perinephritic drain was  removed 5/1.  She returns to IR drain clinic today for ongoing evaluation of her remaining right-sided drain.  Patient denies concerns or new symptoms at home.  She has been flushing the catheter with return of approximately 5-10 mL output daily.  Output in bulb today appears cloudy, yellow.  CT images obtained today are reviewed by Dr. Earleen Newport.  He recommends leaving drain in place until patient is assessed by her Urologist which is expected next week.  Patient can return for drain removal at the discretion of Urology. If to remain in place, Dr. Earleen Newport recommends drain injection and evaluation in 2-4 weeks.  No CT is needed at that time.   The patient is given care instructions and verbalizes understanding.  Signed: Docia Barrier, PA 03/09/2019, 12:20 PM   Please refer to Dr. Earleen Newport attestation of this note for management and plan.

## 2019-03-10 DIAGNOSIS — S52531A Colles' fracture of right radius, initial encounter for closed fracture: Secondary | ICD-10-CM | POA: Diagnosis not present

## 2019-03-14 DIAGNOSIS — N151 Renal and perinephric abscess: Secondary | ICD-10-CM | POA: Diagnosis not present

## 2019-03-14 DIAGNOSIS — J9691 Respiratory failure, unspecified with hypoxia: Secondary | ICD-10-CM | POA: Diagnosis not present

## 2019-03-14 DIAGNOSIS — N3020 Other chronic cystitis without hematuria: Secondary | ICD-10-CM | POA: Diagnosis not present

## 2019-03-14 DIAGNOSIS — N200 Calculus of kidney: Secondary | ICD-10-CM | POA: Diagnosis not present

## 2019-03-14 DIAGNOSIS — N10 Acute tubulo-interstitial nephritis: Secondary | ICD-10-CM | POA: Diagnosis not present

## 2019-03-14 DIAGNOSIS — B9620 Unspecified Escherichia coli [E. coli] as the cause of diseases classified elsewhere: Secondary | ICD-10-CM | POA: Diagnosis not present

## 2019-03-14 DIAGNOSIS — I130 Hypertensive heart and chronic kidney disease with heart failure and stage 1 through stage 4 chronic kidney disease, or unspecified chronic kidney disease: Secondary | ICD-10-CM | POA: Diagnosis not present

## 2019-03-14 DIAGNOSIS — N1330 Unspecified hydronephrosis: Secondary | ICD-10-CM | POA: Diagnosis not present

## 2019-03-14 DIAGNOSIS — N390 Urinary tract infection, site not specified: Secondary | ICD-10-CM | POA: Diagnosis not present

## 2019-03-14 DIAGNOSIS — A0472 Enterocolitis due to Clostridium difficile, not specified as recurrent: Secondary | ICD-10-CM | POA: Diagnosis not present

## 2019-03-15 DIAGNOSIS — Z862 Personal history of diseases of the blood and blood-forming organs and certain disorders involving the immune mechanism: Secondary | ICD-10-CM | POA: Diagnosis not present

## 2019-03-15 DIAGNOSIS — D473 Essential (hemorrhagic) thrombocythemia: Secondary | ICD-10-CM | POA: Diagnosis not present

## 2019-03-16 DIAGNOSIS — N151 Renal and perinephric abscess: Secondary | ICD-10-CM | POA: Diagnosis not present

## 2019-03-16 DIAGNOSIS — B9620 Unspecified Escherichia coli [E. coli] as the cause of diseases classified elsewhere: Secondary | ICD-10-CM | POA: Diagnosis not present

## 2019-03-16 DIAGNOSIS — N390 Urinary tract infection, site not specified: Secondary | ICD-10-CM | POA: Diagnosis not present

## 2019-03-16 DIAGNOSIS — I130 Hypertensive heart and chronic kidney disease with heart failure and stage 1 through stage 4 chronic kidney disease, or unspecified chronic kidney disease: Secondary | ICD-10-CM | POA: Diagnosis not present

## 2019-03-16 DIAGNOSIS — A0472 Enterocolitis due to Clostridium difficile, not specified as recurrent: Secondary | ICD-10-CM | POA: Diagnosis not present

## 2019-03-16 DIAGNOSIS — J9691 Respiratory failure, unspecified with hypoxia: Secondary | ICD-10-CM | POA: Diagnosis not present

## 2019-03-17 ENCOUNTER — Other Ambulatory Visit: Payer: Self-pay | Admitting: Cardiology

## 2019-03-17 NOTE — Telephone Encounter (Signed)
Rx refill sent to pharmacy. 

## 2019-03-18 DIAGNOSIS — M25561 Pain in right knee: Secondary | ICD-10-CM | POA: Diagnosis not present

## 2019-03-23 ENCOUNTER — Other Ambulatory Visit: Payer: Medicare Other

## 2019-03-23 DIAGNOSIS — E785 Hyperlipidemia, unspecified: Secondary | ICD-10-CM | POA: Diagnosis not present

## 2019-03-23 DIAGNOSIS — M545 Low back pain: Secondary | ICD-10-CM | POA: Diagnosis not present

## 2019-03-23 DIAGNOSIS — N189 Chronic kidney disease, unspecified: Secondary | ICD-10-CM | POA: Diagnosis not present

## 2019-03-23 DIAGNOSIS — M8580 Other specified disorders of bone density and structure, unspecified site: Secondary | ICD-10-CM | POA: Diagnosis not present

## 2019-03-23 DIAGNOSIS — M5136 Other intervertebral disc degeneration, lumbar region: Secondary | ICD-10-CM | POA: Diagnosis not present

## 2019-03-23 DIAGNOSIS — E46 Unspecified protein-calorie malnutrition: Secondary | ICD-10-CM | POA: Diagnosis not present

## 2019-03-23 DIAGNOSIS — G43109 Migraine with aura, not intractable, without status migrainosus: Secondary | ICD-10-CM | POA: Diagnosis not present

## 2019-03-23 DIAGNOSIS — J9691 Respiratory failure, unspecified with hypoxia: Secondary | ICD-10-CM | POA: Diagnosis not present

## 2019-03-23 DIAGNOSIS — I130 Hypertensive heart and chronic kidney disease with heart failure and stage 1 through stage 4 chronic kidney disease, or unspecified chronic kidney disease: Secondary | ICD-10-CM | POA: Diagnosis not present

## 2019-03-23 DIAGNOSIS — K449 Diaphragmatic hernia without obstruction or gangrene: Secondary | ICD-10-CM | POA: Diagnosis not present

## 2019-03-23 DIAGNOSIS — I480 Paroxysmal atrial fibrillation: Secondary | ICD-10-CM | POA: Diagnosis not present

## 2019-03-23 DIAGNOSIS — S32008S Other fracture of unspecified lumbar vertebra, sequela: Secondary | ICD-10-CM | POA: Diagnosis not present

## 2019-03-23 DIAGNOSIS — B9620 Unspecified Escherichia coli [E. coli] as the cause of diseases classified elsewhere: Secondary | ICD-10-CM | POA: Diagnosis not present

## 2019-03-23 DIAGNOSIS — A0472 Enterocolitis due to Clostridium difficile, not specified as recurrent: Secondary | ICD-10-CM | POA: Diagnosis not present

## 2019-03-23 DIAGNOSIS — I5032 Chronic diastolic (congestive) heart failure: Secondary | ICD-10-CM | POA: Diagnosis not present

## 2019-03-23 DIAGNOSIS — K2970 Gastritis, unspecified, without bleeding: Secondary | ICD-10-CM | POA: Diagnosis not present

## 2019-03-23 DIAGNOSIS — E871 Hypo-osmolality and hyponatremia: Secondary | ICD-10-CM | POA: Diagnosis not present

## 2019-03-23 DIAGNOSIS — N390 Urinary tract infection, site not specified: Secondary | ICD-10-CM | POA: Diagnosis not present

## 2019-03-23 DIAGNOSIS — I700 Atherosclerosis of aorta: Secondary | ICD-10-CM | POA: Diagnosis not present

## 2019-03-23 DIAGNOSIS — K219 Gastro-esophageal reflux disease without esophagitis: Secondary | ICD-10-CM | POA: Diagnosis not present

## 2019-03-23 DIAGNOSIS — N151 Renal and perinephric abscess: Secondary | ICD-10-CM | POA: Diagnosis not present

## 2019-03-23 DIAGNOSIS — I493 Ventricular premature depolarization: Secondary | ICD-10-CM | POA: Diagnosis not present

## 2019-03-23 DIAGNOSIS — M810 Age-related osteoporosis without current pathological fracture: Secondary | ICD-10-CM | POA: Diagnosis not present

## 2019-03-23 DIAGNOSIS — E669 Obesity, unspecified: Secondary | ICD-10-CM | POA: Diagnosis not present

## 2019-03-23 DIAGNOSIS — N1330 Unspecified hydronephrosis: Secondary | ICD-10-CM | POA: Diagnosis not present

## 2019-03-28 DIAGNOSIS — S52531D Colles' fracture of right radius, subsequent encounter for closed fracture with routine healing: Secondary | ICD-10-CM | POA: Diagnosis not present

## 2019-04-04 DIAGNOSIS — R197 Diarrhea, unspecified: Secondary | ICD-10-CM | POA: Diagnosis not present

## 2019-04-05 DIAGNOSIS — N10 Acute tubulo-interstitial nephritis: Secondary | ICD-10-CM | POA: Diagnosis not present

## 2019-04-05 DIAGNOSIS — R8271 Bacteriuria: Secondary | ICD-10-CM | POA: Diagnosis not present

## 2019-04-11 ENCOUNTER — Ambulatory Visit (INDEPENDENT_AMBULATORY_CARE_PROVIDER_SITE_OTHER): Payer: Medicare Other | Admitting: Cardiology

## 2019-04-11 ENCOUNTER — Encounter: Payer: Self-pay | Admitting: Cardiology

## 2019-04-11 ENCOUNTER — Other Ambulatory Visit: Payer: Self-pay

## 2019-04-11 VITALS — BP 130/82 | HR 78 | Ht 67.0 in | Wt 175.0 lb

## 2019-04-11 DIAGNOSIS — R002 Palpitations: Secondary | ICD-10-CM

## 2019-04-11 DIAGNOSIS — I5032 Chronic diastolic (congestive) heart failure: Secondary | ICD-10-CM | POA: Diagnosis not present

## 2019-04-11 DIAGNOSIS — I480 Paroxysmal atrial fibrillation: Secondary | ICD-10-CM

## 2019-04-11 DIAGNOSIS — I4891 Unspecified atrial fibrillation: Secondary | ICD-10-CM

## 2019-04-11 DIAGNOSIS — I2510 Atherosclerotic heart disease of native coronary artery without angina pectoris: Secondary | ICD-10-CM | POA: Diagnosis not present

## 2019-04-11 NOTE — Patient Instructions (Signed)
Medication Instructions:  Your physician recommends that you continue on your current medications as directed. Please refer to the Current Medication list given to you today.  If you need a refill on your cardiac medications before your next appointment, please call your pharmacy.   Lab work: None.  If you have labs (blood work) drawn today and your tests are completely normal, you will receive your results only by: . MyChart Message (if you have MyChart) OR . A paper copy in the mail If you have any lab test that is abnormal or we need to change your treatment, we will call you to review the results.  Testing/Procedures: None.    Follow-Up: At [facility], you and your health needs are our priority.  As part of our continuing mission to provide you with exceptional heart care, we have created designated Provider Care Teams.  These Care Teams include your primary Cardiologist (physician) and Advanced Practice Providers (APPs -  Physician Assistants and Nurse Practitioners) who all work together to provide you with the care you need, when you need it. You will need a follow up appointment in 4 months.  Please call our office 2 months in advance to schedule this appointment.  You may see No primary care provider on file. or another member of our [facility] Provider Team in [place]: Brian Munley, MD . Rajan Revankar, MD  Any Other Special Instructions Will Be Listed Below (If Applicable).     

## 2019-04-11 NOTE — Progress Notes (Signed)
Cardiology Office Note:    Date:  04/11/2019   ID:  [name] [name], DOB 12/21/1940, MRN [id number]  PCP:  [name], MD  Cardiologist:  Jenne Campus, MD    Referring MD: [name], MD   Chief Complaint  Patient presents with  . Follow-up  Doing very well  History of Present Illness:    [name] [name] is a 78 y.o. female complex past medical history which include paroxysmal atrial fibrillation, coronary artery disease, chronic diastolic congestive heart rate comes today to our office for follow-up overall doing well denies have any chest pain tightness squeezing pressure burning chest.  No palpitations she did have some problem with urinary tract infection that been taking care of she spent few weeks in nursing home and she did not like that  Past Medical History:  Diagnosis Date  . Abdominal aortic atherosclerosis ([place]) 05/07/2016   Noted on CT abd/pelvis  . Anticoagulant long-term use    ELIQUIS  . Atrial fibrillation with RVR ([place])   . Chronic diastolic (congestive) heart failure ([place])   . Coronary artery disease   . Cystocele, midline    followed by dr c. Marvel Plan ([facility] [place])  pt uses pessary  . Diverticulosis 05/07/2016   Noted on CT abd/pelvis  . Dyspnea    w exertion d/t Afib per pt  . First degree heart block   . GERD (gastroesophageal reflux disease)   . Hematuria    intermittently d/t JJ stent  . Hiatal hernia   . History of arteriovenostomy for renal dialysis ([place])   . History of colon polyps   . History of gastric polyp   . History of kidney stones   . History of peptic ulcer disease   . History of septic shock    2011 (pt unaware) and 06-04-2013  w/ acute renal failure  . History of uterine cancer    STAGE I  --  S/P TAH W/ BSO  (NO OTHER TX)  . History of ventilator dependency ([place]) 2014  . Hydronephrosis, right 05/07/2016   Moderate, Noted on CT abd/pelvis  . Hyperlipidemia   . Hypertension   .  Iron deficiency anemia hematology/ oncology--- dr Bobby Rumpf at [facility] in [place]--- per lov note 08-07-2017  stabilized and felt to be more chronic anemia   01/ 2018  dx iron def. anemia  s/p  IV Iron infusion and taking oral iron supplement  . PAF (paroxysmal atrial fibrillation) ([place]) DX JUNE 2012   CARDIOLOGIST--  DR Agustin Cree ([facility] [place]--- that office has changed to [facility]))  CHADS2  . Presence of pessary    for midline pessary  . Ureteral stricture, right    has JJ stent  . Wears glasses     Past Surgical History:  Procedure Laterality Date  . CARDIOVERSION  09/ 2018   dr Agustin Cree   successful (NSR )  . CHOLECYSTECTOMY  2010  . COLONOSCOPY W/ POLYPECTOMY  05/2017  . CYSTO/  BALLOON DILATION RIGHT URETERAL STRICTURE/ STENT PLACEMENT  06-02-2013  . CYSTO/  BILATERAL RETROGRADE PYELOGRAM/ RIGHT URETEROSCOPY AND STENT PLACEMENT  10-04-2005  . CYSTO/ LEFT URETEROSCOPIC STONE EXTRACTION  04-03-2006  . CYSTOSCOPY W/ RETROGRADES Right 01/03/2019   Procedure: CYSTOSCOPY WITH RETROGRADE PYELOGRAM RIGHT WITH STENT EXCHANGE;  Surgeon: Irine Seal, MD;  [facility];  Service: Urology;  Laterality: Right;  . CYSTOSCOPY W/ URETERAL STENT PLACEMENT Right 02/06/2014   Procedure: CYSTOSCOPY WITH RIGHT  RETROGRADE PYELOGRAM RIGHT STENT REMOVAL and REPLACEMENT, Right Ureteroscopy;  Surgeon: Ailene Rud, MD;  Location: [facility];  Service: Urology;  Laterality: Right;  . CYSTOSCOPY W/ URETERAL STENT PLACEMENT Right 11/26/2016   Procedure: CYSTOSCOPY WITH RETROGRADE PYELOGRAM/URETERAL STENT REPLACEMENT;  Surgeon: Alexis Frock, MD;  Location: [facility];  Service: Urology;  Laterality: Right;  . CYSTOSCOPY W/ URETERAL STENT PLACEMENT Right 04/24/2017   Procedure: CYSTOSCOPY WITH RETROGRADE PYELOGRAM/URETERAL STENT REPLACEMENT;  Surgeon: Alexis Frock, MD;  Location: [facility];  Service:  Urology;  Laterality: Right;  . CYSTOSCOPY W/ URETERAL STENT PLACEMENT Right 10/07/2017   Procedure: CYSTOSCOPY WITH RETROGRADE PYELOGRAM/URETERAL STENT EXCHANGE;  Surgeon: Alexis Frock, MD;  Location: [facility];  Service: Urology;  Laterality: Right;  . CYSTOSCOPY W/ URETERAL STENT PLACEMENT Right 03/03/2018   Procedure: CYSTOSCOPY WITH RIGHT RETROGRADE / RIGHT URETERAL STENT EXCHANGE;  Surgeon: Alexis Frock, MD;  [facility];  Service: Urology;  Laterality: Right;  . CYSTOSCOPY W/ URETERAL STENT PLACEMENT Right 09/22/2018   Procedure: CYSTOSCOPY WITH RETROGRADE PYELOGRAM/URETERAL STENT PLACEMENT;  Surgeon: Alexis Frock, MD;  Location: [facility];  Service: Urology;  Laterality: Right;  45 MINS  . CYSTOSCOPY WITH LITHOLAPAXY N/A 11/21/2013   Procedure: CYSTOSCOPY WITH LITHOLAPAXY;  Surgeon: Ailene Rud, MD;  Location: [facility];  Service: Urology;  Laterality: N/A;  . CYSTOSCOPY WITH RETROGRADE PYELOGRAM, URETEROSCOPY AND STENT PLACEMENT Bilateral 03/15/2014   Procedure: CYSTOSCOPY WITH BILATERAL RETROGRADE PYELOGRAM, RIGHT DIAGNOSTIC URETEROSCOPY AND STENT EXCHANGE;  Surgeon: Alexis Frock, MD;  [facility];  Service: Urology;  Laterality: Bilateral;  . CYSTOSCOPY WITH STENT PLACEMENT Right 05/07/2016   Procedure: CYSTOSCOPY WITH RETROGRADE PYELOGRAM, URETERAL STENT PLACEMENT;  Surgeon: Franchot Gallo, MD;  [facility];  Service: Urology;  Laterality: Right;  . HEMIARTHROPLASTY HIP Right 11/2016   fractured  . IR CATHETER TUBE CHANGE  02/04/2019  . IR CATHETER TUBE CHANGE  02/04/2019  . IR CATHETER TUBE CHANGE  02/11/2019  . IR RADIOLOGIST EVAL & MGMT  03/09/2019  . JOINT REPLACEMENT Right 11/2016   Hip Replacement  . LUMBAR FUSION  JULY 2013  . NEPHROLITHOTOMY  X2 YRS AGO  . PERCUTANEOUS NEPHROSTOMY  2014  . TOTAL ABDOMINAL HYSTERECTOMY W/ BILATERAL SALPINGOOPHORECTOMY  1989  . TOTAL KNEE ARTHROPLASTY Bilateral  1992  &  1996  . TRANSTHORACIC ECHOCARDIOGRAM  07-24-2017   dr Agustin Cree   ef 60-65% (improved from last echo 2014, was 45-50%)/  trace TR/  mild AV sclerosis without stenosis  . URETEROSCOPY Right 11/21/2013   Procedure: URETEROSCOPY WITH STENT REMOVAL AND REPLACEMENT;  Surgeon: Ailene Rud, MD;  [facility]  Service: Urology;  Laterality: Right;    Current Medications: Current Meds  Medication Sig  . acetaminophen (TYLENOL) 500 MG tablet Take 1,000 mg by mouth 2 (two) times daily as needed for moderate pain or headache.  [name] amitriptyline (ELAVIL) 25 MG tablet Take 25 mg by mouth at bedtime.  . carvedilol (COREG) 6.25 MG tablet TAKE 1/2 TABLET BY MOUTH TWICE DAILY  . clotrimazole-betamethasone (LOTRISONE) cream Apply 1 application topically 2 (two) times daily as needed for rash.  . diltiazem (CARDIZEM CD) 120 MG 24 hr capsule Take 1 capsule (120 mg total) by mouth every morning. (Patient taking differently: Take 120 mg by mouth every morning. )  . ELIQUIS 5 MG TABS tablet Take 1 tablet (5 mg total) by mouth 2 (two) times daily. (Patient taking differently: Take  5 mg by mouth 2 (two) times daily. )  . flecainide (TAMBOCOR) 50 MG tablet Take 1.5 tablets (75 mg total) by mouth 2 (two) times daily.  [name] omeprazole (PRILOSEC) 40 MG capsule Take 40 mg by mouth every morning.   . ondansetron (ZOFRAN-ODT) 4 MG disintegrating tablet Take 4 mg by mouth every 6 (six) hours as needed for nausea/vomiting.  [name] oxyCODONE-acetaminophen (PERCOCET/ROXICET) 5-325 MG tablet Take 1 tablet by mouth every 6 (six) hours as needed for moderate pain.  [name] triamcinolone cream (KENALOG) 0.1 % Apply 1 application topically 2 (two) times daily as needed for rash.     Allergies:   Codeine, Hydrocodone, and Macrodantin [nitrofurantoin]   Social History   Socioeconomic History  . Marital status: Married    Spouse name: Not on file  . Number of children: Not on file  . Years of education:  Not on file  . Highest education level: Not on file  Occupational History  . Occupation: Retired from [employer]  . Financial resource strain: Not on file  . Food insecurity    Worry: Not on file    Inability: Not on file  . Transportation needs    Medical: Not on file    Non-medical: Not on file  Tobacco Use  . Smoking status: Never Smoker  . Smokeless tobacco: Never Used  Substance and Sexual Activity  . Alcohol use: No  . Drug use: No  . Sexual activity: Not on file  Lifestyle  . Physical activity    Days per week: Not on file    Minutes per session: Not on file  . Stress: Not on file  Relationships  . Social Herbalist on phone: Not on file    Gets together: Not on file    Attends religious service: Not on file    Active member of club or organization: Not on file    Attends meetings of clubs or organizations: Not on file    Relationship status: Not on file  Other Topics Concern  . Not on file  Social History Narrative   Lives in [place] with husband. Drives, walks some, but not very active at home.      Family History: The patient's family history includes Cancer in her mother; Heart disease in her father and mother; Heart failure in her father and mother. ROS:   Please see the history of present illness.    All 14 point review of systems negative except as described per history of present illness  EKGs/Labs/Other Studies Reviewed:      Recent Labs: 01/07/2019: ALT 13 01/12/2019: Magnesium 1.7 01/13/2019: BUN <5; Creatinine, Ser 0.57; Hemoglobin 12.2; Platelets 536; Potassium 3.4; Sodium 138  Recent Lipid Panel No results found for: CHOL, TRIG, HDL, CHOLHDL, VLDL, LDLCALC, LDLDIRECT  Physical Exam:    VS:  BP 130/82   Pulse 78   Ht 5\' 7"  (1.702 m)   Wt 175 lb (79.4 kg)   SpO2 97%   BMI 27.41 kg/m     Wt Readings from Last 3 Encounters:  04/11/19 175 lb (79.4 kg)  01/22/19 205 lb (93 kg)  01/13/19 205 lb 11.2 oz (93.3 kg)      GEN:  Well nourished, well developed in no acute distress HEENT: Normal NECK: No JVD; No carotid bruits LYMPHATICS: No lymphadenopathy CARDIAC: RRR, no murmurs, no rubs, no gallops RESPIRATORY:  Clear to auscultation without rales, wheezing or rhonchi  ABDOMEN: Soft, non-tender, non-distended MUSCULOSKELETAL:  No edema; No deformity  SKIN: Warm and dry LOWER EXTREMITIES: no swelling NEUROLOGIC:  Alert and oriented x 3 PSYCHIATRIC:  Normal affect   ASSESSMENT:    1. Atrial fibrillation with rapid ventricular response (HCC)   2. Paroxysmal atrial fibrillation ([place])   3. Coronary artery disease involving native coronary artery of native heart without angina pectoris   4. Chronic diastolic congestive heart failure (HCC)   5. Palpitations    PLAN:    In order of problems listed above:  1. Atrial fibrillation which is paroxysmal.  Denies having any symptoms right now anticoagulated with Eliquis and will continue with the flecainide. 2.  Coronary artery disease stable, denies having a chest pain tightness squeezing pressure burning in the chest 3.  Chronic diastolic congestive heart failure stable.   Medication Adjustments/Labs and Tests Ordered: Current medicines are reviewed at length with the patient today.  Concerns regarding medicines are outlined above.  Orders Placed This Encounter  Procedures  . EKG 12-Lead   Medication changes: No orders of the defined types were placed in this encounter.   Signed, Park Liter, MD, [facility] 04/11/2019 4:22 PM    [place]

## 2019-04-13 DIAGNOSIS — B9620 Unspecified Escherichia coli [E. coli] as the cause of diseases classified elsewhere: Secondary | ICD-10-CM | POA: Diagnosis not present

## 2019-04-13 DIAGNOSIS — J9691 Respiratory failure, unspecified with hypoxia: Secondary | ICD-10-CM | POA: Diagnosis not present

## 2019-04-13 DIAGNOSIS — I130 Hypertensive heart and chronic kidney disease with heart failure and stage 1 through stage 4 chronic kidney disease, or unspecified chronic kidney disease: Secondary | ICD-10-CM | POA: Diagnosis not present

## 2019-04-13 DIAGNOSIS — N390 Urinary tract infection, site not specified: Secondary | ICD-10-CM | POA: Diagnosis not present

## 2019-04-13 DIAGNOSIS — N151 Renal and perinephric abscess: Secondary | ICD-10-CM | POA: Diagnosis not present

## 2019-04-13 DIAGNOSIS — A0472 Enterocolitis due to Clostridium difficile, not specified as recurrent: Secondary | ICD-10-CM | POA: Diagnosis not present

## 2019-04-19 DIAGNOSIS — J9691 Respiratory failure, unspecified with hypoxia: Secondary | ICD-10-CM | POA: Diagnosis not present

## 2019-04-19 DIAGNOSIS — B9620 Unspecified Escherichia coli [E. coli] as the cause of diseases classified elsewhere: Secondary | ICD-10-CM | POA: Diagnosis not present

## 2019-04-19 DIAGNOSIS — A0472 Enterocolitis due to Clostridium difficile, not specified as recurrent: Secondary | ICD-10-CM | POA: Diagnosis not present

## 2019-04-19 DIAGNOSIS — N151 Renal and perinephric abscess: Secondary | ICD-10-CM | POA: Diagnosis not present

## 2019-04-19 DIAGNOSIS — N390 Urinary tract infection, site not specified: Secondary | ICD-10-CM | POA: Diagnosis not present

## 2019-04-19 DIAGNOSIS — I130 Hypertensive heart and chronic kidney disease with heart failure and stage 1 through stage 4 chronic kidney disease, or unspecified chronic kidney disease: Secondary | ICD-10-CM | POA: Diagnosis not present

## 2019-04-22 DIAGNOSIS — K2970 Gastritis, unspecified, without bleeding: Secondary | ICD-10-CM | POA: Diagnosis not present

## 2019-04-22 DIAGNOSIS — I130 Hypertensive heart and chronic kidney disease with heart failure and stage 1 through stage 4 chronic kidney disease, or unspecified chronic kidney disease: Secondary | ICD-10-CM | POA: Diagnosis not present

## 2019-04-22 DIAGNOSIS — S32008D Other fracture of unspecified lumbar vertebra, subsequent encounter for fracture with routine healing: Secondary | ICD-10-CM | POA: Diagnosis not present

## 2019-04-22 DIAGNOSIS — E669 Obesity, unspecified: Secondary | ICD-10-CM | POA: Diagnosis not present

## 2019-04-22 DIAGNOSIS — M8580 Other specified disorders of bone density and structure, unspecified site: Secondary | ICD-10-CM | POA: Diagnosis not present

## 2019-04-22 DIAGNOSIS — N1330 Unspecified hydronephrosis: Secondary | ICD-10-CM | POA: Diagnosis not present

## 2019-04-22 DIAGNOSIS — K219 Gastro-esophageal reflux disease without esophagitis: Secondary | ICD-10-CM | POA: Diagnosis not present

## 2019-04-22 DIAGNOSIS — I493 Ventricular premature depolarization: Secondary | ICD-10-CM | POA: Diagnosis not present

## 2019-04-22 DIAGNOSIS — M810 Age-related osteoporosis without current pathological fracture: Secondary | ICD-10-CM | POA: Diagnosis not present

## 2019-04-22 DIAGNOSIS — K648 Other hemorrhoids: Secondary | ICD-10-CM | POA: Diagnosis not present

## 2019-04-22 DIAGNOSIS — J329 Chronic sinusitis, unspecified: Secondary | ICD-10-CM | POA: Diagnosis not present

## 2019-04-22 DIAGNOSIS — K449 Diaphragmatic hernia without obstruction or gangrene: Secondary | ICD-10-CM | POA: Diagnosis not present

## 2019-04-22 DIAGNOSIS — I480 Paroxysmal atrial fibrillation: Secondary | ICD-10-CM | POA: Diagnosis not present

## 2019-04-22 DIAGNOSIS — I700 Atherosclerosis of aorta: Secondary | ICD-10-CM | POA: Diagnosis not present

## 2019-04-22 DIAGNOSIS — E785 Hyperlipidemia, unspecified: Secondary | ICD-10-CM | POA: Diagnosis not present

## 2019-04-22 DIAGNOSIS — N151 Renal and perinephric abscess: Secondary | ICD-10-CM | POA: Diagnosis not present

## 2019-04-22 DIAGNOSIS — Z960 Presence of urogenital implants: Secondary | ICD-10-CM | POA: Diagnosis not present

## 2019-04-22 DIAGNOSIS — J9691 Respiratory failure, unspecified with hypoxia: Secondary | ICD-10-CM | POA: Diagnosis not present

## 2019-04-22 DIAGNOSIS — E871 Hypo-osmolality and hyponatremia: Secondary | ICD-10-CM | POA: Diagnosis not present

## 2019-04-22 DIAGNOSIS — E46 Unspecified protein-calorie malnutrition: Secondary | ICD-10-CM | POA: Diagnosis not present

## 2019-04-22 DIAGNOSIS — G43109 Migraine with aura, not intractable, without status migrainosus: Secondary | ICD-10-CM | POA: Diagnosis not present

## 2019-04-22 DIAGNOSIS — N189 Chronic kidney disease, unspecified: Secondary | ICD-10-CM | POA: Diagnosis not present

## 2019-04-22 DIAGNOSIS — I5032 Chronic diastolic (congestive) heart failure: Secondary | ICD-10-CM | POA: Diagnosis not present

## 2019-04-22 DIAGNOSIS — S6291XD Unspecified fracture of right wrist and hand, subsequent encounter for fracture with routine healing: Secondary | ICD-10-CM | POA: Diagnosis not present

## 2019-04-22 DIAGNOSIS — M5136 Other intervertebral disc degeneration, lumbar region: Secondary | ICD-10-CM | POA: Diagnosis not present

## 2019-04-27 DIAGNOSIS — S52531D Colles' fracture of right radius, subsequent encounter for closed fracture with routine healing: Secondary | ICD-10-CM | POA: Diagnosis not present

## 2019-04-29 DIAGNOSIS — I480 Paroxysmal atrial fibrillation: Secondary | ICD-10-CM | POA: Diagnosis not present

## 2019-04-29 DIAGNOSIS — S6291XD Unspecified fracture of right wrist and hand, subsequent encounter for fracture with routine healing: Secondary | ICD-10-CM | POA: Diagnosis not present

## 2019-04-29 DIAGNOSIS — I5032 Chronic diastolic (congestive) heart failure: Secondary | ICD-10-CM | POA: Diagnosis not present

## 2019-04-29 DIAGNOSIS — N189 Chronic kidney disease, unspecified: Secondary | ICD-10-CM | POA: Diagnosis not present

## 2019-04-29 DIAGNOSIS — I130 Hypertensive heart and chronic kidney disease with heart failure and stage 1 through stage 4 chronic kidney disease, or unspecified chronic kidney disease: Secondary | ICD-10-CM | POA: Diagnosis not present

## 2019-04-29 DIAGNOSIS — S32008D Other fracture of unspecified lumbar vertebra, subsequent encounter for fracture with routine healing: Secondary | ICD-10-CM | POA: Diagnosis not present

## 2019-05-03 DIAGNOSIS — N1330 Unspecified hydronephrosis: Secondary | ICD-10-CM | POA: Diagnosis not present

## 2019-05-03 DIAGNOSIS — N952 Postmenopausal atrophic vaginitis: Secondary | ICD-10-CM | POA: Diagnosis not present

## 2019-05-03 DIAGNOSIS — N3020 Other chronic cystitis without hematuria: Secondary | ICD-10-CM | POA: Diagnosis not present

## 2019-05-03 DIAGNOSIS — N200 Calculus of kidney: Secondary | ICD-10-CM | POA: Diagnosis not present

## 2019-05-04 DIAGNOSIS — S6291XD Unspecified fracture of right wrist and hand, subsequent encounter for fracture with routine healing: Secondary | ICD-10-CM | POA: Diagnosis not present

## 2019-05-04 DIAGNOSIS — I480 Paroxysmal atrial fibrillation: Secondary | ICD-10-CM | POA: Diagnosis not present

## 2019-05-04 DIAGNOSIS — I130 Hypertensive heart and chronic kidney disease with heart failure and stage 1 through stage 4 chronic kidney disease, or unspecified chronic kidney disease: Secondary | ICD-10-CM | POA: Diagnosis not present

## 2019-05-04 DIAGNOSIS — S32008D Other fracture of unspecified lumbar vertebra, subsequent encounter for fracture with routine healing: Secondary | ICD-10-CM | POA: Diagnosis not present

## 2019-05-04 DIAGNOSIS — N189 Chronic kidney disease, unspecified: Secondary | ICD-10-CM | POA: Diagnosis not present

## 2019-05-04 DIAGNOSIS — I5032 Chronic diastolic (congestive) heart failure: Secondary | ICD-10-CM | POA: Diagnosis not present

## 2019-05-05 DIAGNOSIS — S6291XD Unspecified fracture of right wrist and hand, subsequent encounter for fracture with routine healing: Secondary | ICD-10-CM | POA: Diagnosis not present

## 2019-05-11 DIAGNOSIS — I130 Hypertensive heart and chronic kidney disease with heart failure and stage 1 through stage 4 chronic kidney disease, or unspecified chronic kidney disease: Secondary | ICD-10-CM | POA: Diagnosis not present

## 2019-05-11 DIAGNOSIS — S32008D Other fracture of unspecified lumbar vertebra, subsequent encounter for fracture with routine healing: Secondary | ICD-10-CM | POA: Diagnosis not present

## 2019-05-11 DIAGNOSIS — N189 Chronic kidney disease, unspecified: Secondary | ICD-10-CM | POA: Diagnosis not present

## 2019-05-11 DIAGNOSIS — S6291XD Unspecified fracture of right wrist and hand, subsequent encounter for fracture with routine healing: Secondary | ICD-10-CM | POA: Diagnosis not present

## 2019-05-11 DIAGNOSIS — I480 Paroxysmal atrial fibrillation: Secondary | ICD-10-CM | POA: Diagnosis not present

## 2019-05-11 DIAGNOSIS — I5032 Chronic diastolic (congestive) heart failure: Secondary | ICD-10-CM | POA: Diagnosis not present

## 2019-05-17 ENCOUNTER — Other Ambulatory Visit: Payer: Self-pay | Admitting: Urology

## 2019-05-18 DIAGNOSIS — S52531D Colles' fracture of right radius, subsequent encounter for closed fracture with routine healing: Secondary | ICD-10-CM | POA: Diagnosis not present

## 2019-05-19 DIAGNOSIS — N189 Chronic kidney disease, unspecified: Secondary | ICD-10-CM | POA: Diagnosis not present

## 2019-05-19 DIAGNOSIS — I480 Paroxysmal atrial fibrillation: Secondary | ICD-10-CM | POA: Diagnosis not present

## 2019-05-19 DIAGNOSIS — S6291XD Unspecified fracture of right wrist and hand, subsequent encounter for fracture with routine healing: Secondary | ICD-10-CM | POA: Diagnosis not present

## 2019-05-19 DIAGNOSIS — I130 Hypertensive heart and chronic kidney disease with heart failure and stage 1 through stage 4 chronic kidney disease, or unspecified chronic kidney disease: Secondary | ICD-10-CM | POA: Diagnosis not present

## 2019-05-19 DIAGNOSIS — S32008D Other fracture of unspecified lumbar vertebra, subsequent encounter for fracture with routine healing: Secondary | ICD-10-CM | POA: Diagnosis not present

## 2019-05-19 DIAGNOSIS — I5032 Chronic diastolic (congestive) heart failure: Secondary | ICD-10-CM | POA: Diagnosis not present

## 2019-05-22 DIAGNOSIS — S6291XD Unspecified fracture of right wrist and hand, subsequent encounter for fracture with routine healing: Secondary | ICD-10-CM | POA: Diagnosis not present

## 2019-05-22 DIAGNOSIS — I493 Ventricular premature depolarization: Secondary | ICD-10-CM | POA: Diagnosis not present

## 2019-05-22 DIAGNOSIS — K219 Gastro-esophageal reflux disease without esophagitis: Secondary | ICD-10-CM | POA: Diagnosis not present

## 2019-05-22 DIAGNOSIS — E871 Hypo-osmolality and hyponatremia: Secondary | ICD-10-CM | POA: Diagnosis not present

## 2019-05-22 DIAGNOSIS — M810 Age-related osteoporosis without current pathological fracture: Secondary | ICD-10-CM | POA: Diagnosis not present

## 2019-05-22 DIAGNOSIS — J9691 Respiratory failure, unspecified with hypoxia: Secondary | ICD-10-CM | POA: Diagnosis not present

## 2019-05-22 DIAGNOSIS — N189 Chronic kidney disease, unspecified: Secondary | ICD-10-CM | POA: Diagnosis not present

## 2019-05-22 DIAGNOSIS — K648 Other hemorrhoids: Secondary | ICD-10-CM | POA: Diagnosis not present

## 2019-05-22 DIAGNOSIS — I130 Hypertensive heart and chronic kidney disease with heart failure and stage 1 through stage 4 chronic kidney disease, or unspecified chronic kidney disease: Secondary | ICD-10-CM | POA: Diagnosis not present

## 2019-05-22 DIAGNOSIS — K2970 Gastritis, unspecified, without bleeding: Secondary | ICD-10-CM | POA: Diagnosis not present

## 2019-05-22 DIAGNOSIS — M5136 Other intervertebral disc degeneration, lumbar region: Secondary | ICD-10-CM | POA: Diagnosis not present

## 2019-05-22 DIAGNOSIS — I5032 Chronic diastolic (congestive) heart failure: Secondary | ICD-10-CM | POA: Diagnosis not present

## 2019-05-22 DIAGNOSIS — E669 Obesity, unspecified: Secondary | ICD-10-CM | POA: Diagnosis not present

## 2019-05-22 DIAGNOSIS — I700 Atherosclerosis of aorta: Secondary | ICD-10-CM | POA: Diagnosis not present

## 2019-05-22 DIAGNOSIS — K449 Diaphragmatic hernia without obstruction or gangrene: Secondary | ICD-10-CM | POA: Diagnosis not present

## 2019-05-22 DIAGNOSIS — G43109 Migraine with aura, not intractable, without status migrainosus: Secondary | ICD-10-CM | POA: Diagnosis not present

## 2019-05-22 DIAGNOSIS — S32008D Other fracture of unspecified lumbar vertebra, subsequent encounter for fracture with routine healing: Secondary | ICD-10-CM | POA: Diagnosis not present

## 2019-05-22 DIAGNOSIS — Z960 Presence of urogenital implants: Secondary | ICD-10-CM | POA: Diagnosis not present

## 2019-05-22 DIAGNOSIS — M8580 Other specified disorders of bone density and structure, unspecified site: Secondary | ICD-10-CM | POA: Diagnosis not present

## 2019-05-22 DIAGNOSIS — N151 Renal and perinephric abscess: Secondary | ICD-10-CM | POA: Diagnosis not present

## 2019-05-22 DIAGNOSIS — E46 Unspecified protein-calorie malnutrition: Secondary | ICD-10-CM | POA: Diagnosis not present

## 2019-05-22 DIAGNOSIS — N1330 Unspecified hydronephrosis: Secondary | ICD-10-CM | POA: Diagnosis not present

## 2019-05-22 DIAGNOSIS — I480 Paroxysmal atrial fibrillation: Secondary | ICD-10-CM | POA: Diagnosis not present

## 2019-05-22 DIAGNOSIS — J329 Chronic sinusitis, unspecified: Secondary | ICD-10-CM | POA: Diagnosis not present

## 2019-05-22 DIAGNOSIS — E785 Hyperlipidemia, unspecified: Secondary | ICD-10-CM | POA: Diagnosis not present

## 2019-05-24 DIAGNOSIS — I130 Hypertensive heart and chronic kidney disease with heart failure and stage 1 through stage 4 chronic kidney disease, or unspecified chronic kidney disease: Secondary | ICD-10-CM | POA: Diagnosis not present

## 2019-05-24 DIAGNOSIS — N189 Chronic kidney disease, unspecified: Secondary | ICD-10-CM | POA: Diagnosis not present

## 2019-05-24 DIAGNOSIS — I5032 Chronic diastolic (congestive) heart failure: Secondary | ICD-10-CM | POA: Diagnosis not present

## 2019-05-24 DIAGNOSIS — S32008D Other fracture of unspecified lumbar vertebra, subsequent encounter for fracture with routine healing: Secondary | ICD-10-CM | POA: Diagnosis not present

## 2019-05-24 DIAGNOSIS — S6291XD Unspecified fracture of right wrist and hand, subsequent encounter for fracture with routine healing: Secondary | ICD-10-CM | POA: Diagnosis not present

## 2019-05-24 DIAGNOSIS — I480 Paroxysmal atrial fibrillation: Secondary | ICD-10-CM | POA: Diagnosis not present

## 2019-05-30 DIAGNOSIS — N8111 Cystocele, midline: Secondary | ICD-10-CM | POA: Diagnosis not present

## 2019-05-30 DIAGNOSIS — Z4689 Encounter for fitting and adjustment of other specified devices: Secondary | ICD-10-CM | POA: Diagnosis not present

## 2019-05-31 ENCOUNTER — Other Ambulatory Visit (HOSPITAL_COMMUNITY)
Admission: RE | Admit: 2019-05-31 | Discharge: 2019-05-31 | Disposition: A | Discharge status: Home / Self Care | Payer: Medicare Other | Source: Ambulatory Visit | Attending: Urology | Admitting: Urology

## 2019-05-31 DIAGNOSIS — Z20828 Contact with and (suspected) exposure to other viral communicable diseases: Secondary | ICD-10-CM | POA: Insufficient documentation

## 2019-05-31 DIAGNOSIS — Z01812 Encounter for preprocedural laboratory examination: Secondary | ICD-10-CM | POA: Insufficient documentation

## 2019-05-31 LAB — SARS CORONAVIRUS 2 (TAT 6-24 HRS): SARS Coronavirus 2: NEGATIVE

## 2019-06-01 ENCOUNTER — Other Ambulatory Visit: Payer: Self-pay

## 2019-06-01 ENCOUNTER — Encounter (HOSPITAL_BASED_OUTPATIENT_CLINIC_OR_DEPARTMENT_OTHER): Payer: Self-pay | Admitting: *Deleted

## 2019-06-01 NOTE — Progress Notes (Addendum)
Spoke w/ pt via phone for pre-op interview.  Npo after mn.  Arrive at 0800.  Needs istat 8.  Current ekg in chart and epic.  Pt had covid test done yesterday.  Will take am meds dos w/ sips of water.  Pt takes eliquis, managed by cardiologist.  Per dr Tresa Moore pt is to continued eliquis.  Per verbalized was given instructions by dr Tresa Moore not to stop eliquis.  Pt denies any cardiac s&s.  Pt uses walker for generalized weakness in legs.  Chart to be reviewed by anesthesia, Konrad Felix PA.  ADDENDUM:  Ok to proceed per anesthesia,  Konrad Felix PA.

## 2019-06-02 MED ORDER — GENTAMICIN SULFATE 40 MG/ML IJ SOLN
5.0000 mg/kg | INTRAVENOUS | Status: AC
  Administered 2019-06-03: 10:00:00 400 mg via INTRAVENOUS
  Filled 2019-06-02 (×2): qty 10

## 2019-06-02 NOTE — Progress Notes (Signed)
Anesthesia Chart Review   Case: [id number] Date/Time: 06/03/19 0945   Procedure: CYSTOSCOPY WITH RETROGRADE PYELOGRAM/URETERAL STENT PLACEMENT (Right ) - 44 MINS   Anesthesia type: General   Pre-op diagnosis: RIGHT URETERAL STRICTURE   Location: [facility] ROOM 2 / [place]   Surgeon: Alexis Frock, MD      DISCUSSION:78 y.o. never smoker with h/o GERD, PAF (on Eliquis), hiatal hernia, chronic diastolic heart failure, HLD, HTN, CAD, cystocele w/pessary,  uterine cancer, right ureteral stricture scheduled for above procedure 06/03/2019 with Dr. Alexis Frock.    Last seen by cardiologist, Dr. Tristan Schroeder, 04/11/2019.  Per OV note CAD and chronic diastolic congestive heart failure stable.    Per Dr. Tresa Moore pt is to continue Eliquis.    Anticipate pt can proceed with planned procedure barring acute status change and after evaluation DOS (SDW).   VS: Ht 5\' 7"  (1.702 m)   Wt 75.8 kg   BMI 26.16 kg/m   PROVIDERS: [name], MD is PCP   Jenne Campus, MD is Cardiologist  LABS: SDW (all labs ordered are listed, but only abnormal results are displayed)  Labs Reviewed - No data to display   IMAGES:   EKG: 04/11/2019 Rate 78 bpm Sinus rhythm with 1st degree AV block Cannot rule out anterior infarct, age undetermined  CV: Echo 07/24/2017 Conclusions 1. There is normal global left ventricular contractility 2. Overall left ventricular systolic function is normal with, an EF between 60-65% 3. Trace tricuspid regurgitation present Past Medical History:  Diagnosis Date  . Abdominal aortic atherosclerosis ([place]) 05/07/2016   Noted on CT abd/pelvis  . Anticoagulant long-term use    ELIQUIS  . Chronic diastolic (congestive) heart failure ([place])   . Coronary artery disease   . Cystocele, midline    followed by dr c. Marvel Plan [facility] in [place])  pt uses pessary  . Diverticulosis 05/07/2016   Noted on CT abd/pelvis  . DOE (dyspnea on  exertion)   . First degree heart block   . General weakness   . GERD (gastroesophageal reflux disease)   . Hematuria    intermittently due to ureter right stent  . Hiatal hernia   . History of arteriovenostomy for renal dialysis ([place])   . History of colon polyps   . History of gastric polyp   . History of kidney infection    12-31-2018  perinephrtic abscess  right side  s/p drains x2 01-10-2019,  05/ 2020 drains removed  . History of kidney stones   . History of peptic ulcer disease   . History of septic shock    2011 (pt unaware) and 06-04-2013  w/ acute renal failure  . History of uterine cancer    STAGE I  --  S/P TAH W/ BSO  (NO OTHER TX)  . History of ventilator dependency ([place]) 2014   in setting of septic shock  . Hyperlipidemia   . Hypertension   . Iron deficiency anemia hematology/ oncology--- dr Bobby Rumpf at [facility] in [place]--- per lov note 08-07-2017  stabilized and felt to be more chronic anemia   01/ 2018  dx iron def. anemia  s/p  IV Iron infusion and taking oral iron supplement  . Lumbar compression fracture ([place])    01-22-2019 per imaging L1  (06-01-2019 per pt currently no pain)  . PAF (paroxysmal atrial fibrillation) ([place]) DX JUNE 2012   CARDIOLOGIST--  DR Vernie Shanks--- [facility])  CHADS2  .  Presence of pessary   . Right wrist fracture    per pt fractured on 03-10-2019,  had closed reduction and wearing a brace  . Ureteral stricture, right urologist-- dr Tresa Moore   Chronic--- treated with ureteral stent  . Uses walker    and wheelchair for longer distance  . Wears glasses     Past Surgical History:  Procedure Laterality Date  . CARDIOVERSION  09/ 2018   dr Agustin Cree   successful (NSR )  . CHOLECYSTECTOMY  2010  . COLONOSCOPY W/ POLYPECTOMY  05/2017  . CYSTO/  BALLOON DILATION RIGHT URETERAL STRICTURE/ STENT PLACEMENT  06-02-2013  . CYSTO/  BILATERAL RETROGRADE PYELOGRAM/ RIGHT URETEROSCOPY AND STENT PLACEMENT   10-04-2005  . CYSTO/ LEFT URETEROSCOPIC STONE EXTRACTION  04-03-2006  . CYSTOSCOPY W/ RETROGRADES Right 01/03/2019   Procedure: CYSTOSCOPY WITH RETROGRADE PYELOGRAM RIGHT WITH STENT EXCHANGE;  Surgeon: Irine Seal, MD;  [facility];  Service: Urology;  Laterality: Right;  . CYSTOSCOPY W/ URETERAL STENT PLACEMENT Right 02/06/2014   Procedure: CYSTOSCOPY WITH RIGHT RETROGRADE PYELOGRAM RIGHT STENT REMOVAL and REPLACEMENT, Right Ureteroscopy;  Surgeon: Ailene Rud, MD;  Location[facility];  Service: Urology;  Laterality: Right;  . CYSTOSCOPY W/ URETERAL STENT PLACEMENT Right 11/26/2016   Procedure: CYSTOSCOPY WITH RETROGRADE PYELOGRAM/URETERAL STENT REPLACEMENT;  Surgeon: Alexis Frock, MD;  Location: [facility];  Service: Urology;  Laterality: Right;  . CYSTOSCOPY W/ URETERAL STENT PLACEMENT Right 04/24/2017   Procedure: CYSTOSCOPY WITH RETROGRADE PYELOGRAM/URETERAL STENT REPLACEMENT;  Surgeon: Alexis Frock, MD;  Location: [facility];  Service: Urology;  Laterality: Right;  . CYSTOSCOPY W/ URETERAL STENT PLACEMENT Right 10/07/2017   Procedure: CYSTOSCOPY WITH RETROGRADE PYELOGRAM/URETERAL STENT EXCHANGE;  Surgeon: Alexis Frock, MD;  Location: [facility];  Service: Urology;  Laterality: Right;  . CYSTOSCOPY W/ URETERAL STENT PLACEMENT Right 03/03/2018   Procedure: CYSTOSCOPY WITH RIGHT RETROGRADE / RIGHT URETERAL STENT EXCHANGE;  Surgeon: Alexis Frock, MD;  [facility];  Service: Urology;  Laterality: Right;  . CYSTOSCOPY W/ URETERAL STENT PLACEMENT Right 09/22/2018   Procedure: CYSTOSCOPY WITH RETROGRADE PYELOGRAM/URETERAL STENT PLACEMENT;  Surgeon: Alexis Frock, MD;  Location: [facility];  Service: Urology;  Laterality: Right;  45 MINS  . CYSTOSCOPY WITH LITHOLAPAXY N/A 11/21/2013   Procedure: CYSTOSCOPY WITH LITHOLAPAXY;  Surgeon: Ailene Rud, MD;  Location: [facility];   Service: Urology;  Laterality: N/A;  . CYSTOSCOPY WITH RETROGRADE PYELOGRAM, URETEROSCOPY AND STENT PLACEMENT Bilateral 03/15/2014   Procedure: CYSTOSCOPY WITH BILATERAL RETROGRADE PYELOGRAM, RIGHT DIAGNOSTIC URETEROSCOPY AND STENT EXCHANGE;  Surgeon: Alexis Frock, MD;  [facility];  Service: Urology;  Laterality: Bilateral;  . CYSTOSCOPY WITH STENT PLACEMENT Right 05/07/2016   Procedure: CYSTOSCOPY WITH RETROGRADE PYELOGRAM, URETERAL STENT PLACEMENT;  Surgeon: Franchot Gallo, MD;  [facility];  Service: Urology;  Laterality: Right;  . HEMIARTHROPLASTY HIP Right 11/2016   fractured  . IR CATHETER TUBE CHANGE  02/04/2019  . IR CATHETER TUBE CHANGE  02/04/2019  . IR CATHETER TUBE CHANGE  02/11/2019  . IR RADIOLOGIST EVAL & MGMT  03/09/2019  . LUMBAR FUSION  JULY 2013  . NEPHROLITHOTOMY  X2 YRS AGO  . PERCUTANEOUS NEPHROSTOMY  2014  . TOTAL ABDOMINAL HYSTERECTOMY W/ BILATERAL SALPINGOOPHORECTOMY  1989  . TOTAL KNEE ARTHROPLASTY Bilateral 1992  &  1996  . TRANSTHORACIC ECHOCARDIOGRAM  07-24-2017   dr Agustin Cree   ef 60-65% (improved from last echo 2014, was 45-50%)/  trace TR/  mild  AV sclerosis without stenosis  . URETEROSCOPY Right 11/21/2013   Procedure: URETEROSCOPY WITH STENT REMOVAL AND REPLACEMENT;  Surgeon: Ailene Rud, MD;  Location[facility];  Service: Urology;  Laterality: Right;    MEDICATIONS: . [START ON 06/03/2019] gentamicin (GARAMYCIN) 400 mg in dextrose 5 % 100 mL IVPB   . acetaminophen (TYLENOL) 500 MG tablet  . amitriptyline (ELAVIL) 25 MG tablet  . carvedilol (COREG) 6.25 MG tablet  . diltiazem (CARDIZEM CD) 120 MG 24 hr capsule  . ELIQUIS 5 MG TABS tablet  . flecainide (TAMBOCOR) 50 MG tablet  . omeprazole (PRILOSEC) 40 MG capsule  . ondansetron (ZOFRAN-ODT) 4 MG disintegrating tablet  . clotrimazole-betamethasone (LOTRISONE) cream  . triamcinolone cream (KENALOG) 0.1 %    Maia Plan [facility] Pre-Surgical Testing [phone] 06/02/19  12:57 PM

## 2019-06-02 NOTE — Anesthesia Preprocedure Evaluation (Addendum)
Anesthesia Evaluation  Patient identified by MRN, date of birth, ID band Patient awake    Reviewed: Allergy & Precautions, NPO status , Patient's Chart, lab work & pertinent test results, reviewed documented beta blocker date and time   Airway Mallampati: II  TM Distance: >3 FB Neck ROM: Full    Dental  (+) Teeth Intact, Dental Advisory Given   Pulmonary neg pulmonary ROS,    Pulmonary exam normal breath sounds clear to auscultation       Cardiovascular hypertension, Pt. on home beta blockers and Pt. on medications + CAD, + Past MI, +CHF and + DOE  Normal cardiovascular exam+ dysrhythmias (1st degree heart block) Atrial Fibrillation  Rhythm:Regular Rate:Normal     Neuro/Psych negative neurological ROS  negative psych ROS   GI/Hepatic Neg liver ROS, hiatal hernia, GERD  Medicated,  Endo/Other  negative endocrine ROS  Renal/GU Renal disease (RIGHT URETERAL STRICTURE; History of arteriovenostomy for renal dialysis)     Musculoskeletal negative musculoskeletal ROS (+)   Abdominal   Peds  Hematology  (+) Blood dyscrasia (Eliquis), ,   Anesthesia Other Findings Day of surgery medications reviewed with the patient.  Reproductive/Obstetrics                            Anesthesia Physical Anesthesia Plan  ASA: II  Anesthesia Plan: General   Post-op Pain Management:    Induction: Intravenous  PONV Risk Score and Plan: 4 or greater and Dexamethasone, Ondansetron and Treatment may vary due to age or medical condition  Airway Management Planned: LMA  Additional Equipment:   Intra-op Plan:   Post-operative Plan: Extubation in OR  Informed Consent: I have reviewed the patients History and Physical, chart, labs and discussed the procedure including the risks, benefits and alternatives for the proposed anesthesia with the patient or authorized representative who has indicated his/her  understanding and acceptance.     Dental advisory given  Plan Discussed with: CRNA  Anesthesia Plan Comments: (See PAT note 06/02/2019, Konrad Felix, PA-C)       Anesthesia Quick Evaluation

## 2019-06-03 ENCOUNTER — Ambulatory Visit (HOSPITAL_BASED_OUTPATIENT_CLINIC_OR_DEPARTMENT_OTHER): Payer: Medicare Other | Admitting: Physician Assistant

## 2019-06-03 ENCOUNTER — Encounter (HOSPITAL_BASED_OUTPATIENT_CLINIC_OR_DEPARTMENT_OTHER)
Admission: RE | Disposition: A | Discharge status: Home / Self Care | Payer: Self-pay | Source: Home / Self Care | Attending: Urology

## 2019-06-03 ENCOUNTER — Other Ambulatory Visit: Payer: Self-pay

## 2019-06-03 ENCOUNTER — Encounter (HOSPITAL_BASED_OUTPATIENT_CLINIC_OR_DEPARTMENT_OTHER): Payer: Self-pay | Admitting: Certified Registered"

## 2019-06-03 ENCOUNTER — Ambulatory Visit (HOSPITAL_BASED_OUTPATIENT_CLINIC_OR_DEPARTMENT_OTHER)
Admission: RE | Admit: 2019-06-03 | Discharge: 2019-06-03 | Disposition: A | Discharge status: Home / Self Care | Payer: Medicare Other | Attending: Urology | Admitting: Urology

## 2019-06-03 DIAGNOSIS — N135 Crossing vessel and stricture of ureter without hydronephrosis: Secondary | ICD-10-CM | POA: Diagnosis not present

## 2019-06-03 DIAGNOSIS — I110 Hypertensive heart disease with heart failure: Secondary | ICD-10-CM | POA: Diagnosis not present

## 2019-06-03 DIAGNOSIS — N131 Hydronephrosis with ureteral stricture, not elsewhere classified: Secondary | ICD-10-CM | POA: Diagnosis not present

## 2019-06-03 DIAGNOSIS — I2510 Atherosclerotic heart disease of native coronary artery without angina pectoris: Secondary | ICD-10-CM | POA: Insufficient documentation

## 2019-06-03 DIAGNOSIS — I5032 Chronic diastolic (congestive) heart failure: Secondary | ICD-10-CM | POA: Insufficient documentation

## 2019-06-03 DIAGNOSIS — K219 Gastro-esophageal reflux disease without esophagitis: Secondary | ICD-10-CM | POA: Insufficient documentation

## 2019-06-03 DIAGNOSIS — E785 Hyperlipidemia, unspecified: Secondary | ICD-10-CM | POA: Insufficient documentation

## 2019-06-03 DIAGNOSIS — Z7901 Long term (current) use of anticoagulants: Secondary | ICD-10-CM | POA: Insufficient documentation

## 2019-06-03 DIAGNOSIS — Z8542 Personal history of malignant neoplasm of other parts of uterus: Secondary | ICD-10-CM | POA: Diagnosis not present

## 2019-06-03 DIAGNOSIS — Z79899 Other long term (current) drug therapy: Secondary | ICD-10-CM | POA: Insufficient documentation

## 2019-06-03 DIAGNOSIS — Z87440 Personal history of urinary (tract) infections: Secondary | ICD-10-CM | POA: Insufficient documentation

## 2019-06-03 DIAGNOSIS — I252 Old myocardial infarction: Secondary | ICD-10-CM | POA: Insufficient documentation

## 2019-06-03 HISTORY — PX: CYSTOSCOPY W/ URETERAL STENT PLACEMENT: SHX1429

## 2019-06-03 HISTORY — DX: Personal history of urinary (tract) infections: Z87.440

## 2019-06-03 HISTORY — DX: Wedge compression fracture of unspecified lumbar vertebra, initial encounter for closed fracture: S32.000A

## 2019-06-03 HISTORY — DX: Dependence on other enabling machines and devices: Z99.89

## 2019-06-03 HISTORY — DX: Weakness: R53.1

## 2019-06-03 HISTORY — DX: Fracture of unspecified carpal bone, right wrist, initial encounter for closed fracture: S62.101A

## 2019-06-03 LAB — POCT I-STAT, CHEM 8
BUN: 19 mg/dL (ref 8–23)
Calcium, Ion: 1.22 mmol/L (ref 1.15–1.40)
Chloride: 106 mmol/L (ref 98–111)
Creatinine, Ser: 0.8 mg/dL (ref 0.44–1.00)
Glucose, Bld: 112 mg/dL — ABNORMAL HIGH (ref 70–99)
HCT: 47 % — ABNORMAL HIGH (ref 36.0–46.0)
Hemoglobin: 16 g/dL — ABNORMAL HIGH (ref 12.0–15.0)
Potassium: 4.2 mmol/L (ref 3.5–5.1)
Sodium: 138 mmol/L (ref 135–145)
TCO2: 24 mmol/L (ref 22–32)

## 2019-06-03 SURGERY — CYSTOSCOPY, WITH RETROGRADE PYELOGRAM AND URETERAL STENT INSERTION
Anesthesia: General | Site: Renal | Laterality: Right

## 2019-06-03 MED ORDER — ONDANSETRON HCL 4 MG/2ML IJ SOLN
INTRAMUSCULAR | Status: AC
  Filled 2019-06-03: qty 2

## 2019-06-03 MED ORDER — FENTANYL CITRATE (PF) 100 MCG/2ML IJ SOLN
INTRAMUSCULAR | Status: AC
  Filled 2019-06-03: qty 2

## 2019-06-03 MED ORDER — LIDOCAINE 2% (20 MG/ML) 5 ML SYRINGE
INTRAMUSCULAR | Status: DC | PRN
  Administered 2019-06-03: 160 mg via INTRAVENOUS

## 2019-06-03 MED ORDER — DEXAMETHASONE SODIUM PHOSPHATE 10 MG/ML IJ SOLN
INTRAMUSCULAR | Status: DC | PRN
  Administered 2019-06-03: 10 mg via INTRAVENOUS

## 2019-06-03 MED ORDER — SODIUM CHLORIDE 0.9 % IR SOLN
Status: DC | PRN
  Administered 2019-06-03: 3000 mL

## 2019-06-03 MED ORDER — ACETAMINOPHEN 500 MG PO TABS
ORAL_TABLET | ORAL | Status: AC
  Filled 2019-06-03: qty 2

## 2019-06-03 MED ORDER — FENTANYL CITRATE (PF) 100 MCG/2ML IJ SOLN
INTRAMUSCULAR | Status: DC | PRN
  Administered 2019-06-03 (×2): 50 ug via INTRAVENOUS

## 2019-06-03 MED ORDER — LACTATED RINGERS IV SOLN
INTRAVENOUS | Status: DC
  Administered 2019-06-03: 50 mL/h via INTRAVENOUS
  Filled 2019-06-03: qty 1000

## 2019-06-03 MED ORDER — ONDANSETRON HCL 4 MG/2ML IJ SOLN
INTRAMUSCULAR | Status: DC | PRN
  Administered 2019-06-03: 4 mg via INTRAVENOUS

## 2019-06-03 MED ORDER — LIDOCAINE 2% (20 MG/ML) 5 ML SYRINGE
INTRAMUSCULAR | Status: AC
  Filled 2019-06-03: qty 5

## 2019-06-03 MED ORDER — PROPOFOL 10 MG/ML IV BOLUS
INTRAVENOUS | Status: AC
  Filled 2019-06-03: qty 20

## 2019-06-03 MED ORDER — IOHEXOL 300 MG/ML  SOLN
INTRAMUSCULAR | Status: DC | PRN
  Administered 2019-06-03: 15 mL

## 2019-06-03 MED ORDER — ACETAMINOPHEN 500 MG PO TABS
1000.0000 mg | ORAL_TABLET | Freq: Once | ORAL | Status: AC
  Administered 2019-06-03: 1000 mg via ORAL
  Filled 2019-06-03: qty 2

## 2019-06-03 MED ORDER — DEXAMETHASONE SODIUM PHOSPHATE 10 MG/ML IJ SOLN
INTRAMUSCULAR | Status: AC
  Filled 2019-06-03: qty 1

## 2019-06-03 SURGICAL SUPPLY — 23 items
BAG DRAIN URO-CYSTO SKYTR STRL (DRAIN) ×3 IMPLANT
BASKET LASER NITINOL 1.9FR (BASKET) IMPLANT
CATH INTERMIT  6FR 70CM (CATHETERS) IMPLANT
CLOTH BEACON ORANGE TIMEOUT ST (SAFETY) ×3 IMPLANT
FIBER LASER FLEXIVA 365 (UROLOGICAL SUPPLIES) IMPLANT
FIBER LASER TRAC TIP (UROLOGICAL SUPPLIES) IMPLANT
GLOVE BIO SURGEON STRL SZ7.5 (GLOVE) ×3 IMPLANT
GOWN STRL REUS W/TWL LRG LVL3 (GOWN DISPOSABLE) ×3 IMPLANT
GUIDEWIRE ANG ZIPWIRE 038X150 (WIRE) ×3 IMPLANT
GUIDEWIRE STR DUAL SENSOR (WIRE) ×1 IMPLANT
IV NS 1000ML (IV SOLUTION) ×2
IV NS 1000ML BAXH (IV SOLUTION) ×1 IMPLANT
IV NS IRRIG 3000ML ARTHROMATIC (IV SOLUTION) ×3 IMPLANT
KIT TURNOVER CYSTO (KITS) ×3 IMPLANT
MANIFOLD NEPTUNE II (INSTRUMENTS) ×3 IMPLANT
NS IRRIG 500ML POUR BTL (IV SOLUTION) ×6 IMPLANT
PACK CYSTO (CUSTOM PROCEDURE TRAY) ×3 IMPLANT
STENT POLARIS LOOP 8FR X 24 CM (STENTS) ×2 IMPLANT
SYR 10ML LL (SYRINGE) ×3 IMPLANT
TUBE CONNECTING 12'X1/4 (SUCTIONS)
TUBE CONNECTING 12X1/4 (SUCTIONS) IMPLANT
TUBE FEEDING 8FR 16IN STR KANG (MISCELLANEOUS) IMPLANT
TUBING UROLOGY SET (TUBING) IMPLANT

## 2019-06-03 NOTE — Anesthesia Procedure Notes (Signed)
Procedure Name: LMA Insertion Date/Time: 06/03/2019 10:23 AM Performed by: Adonna Horsley D, CRNA Pre-anesthesia Checklist: Patient identified, Emergency Drugs available, Suction available and Patient being monitored Patient Re-evaluated:Patient Re-evaluated prior to induction Oxygen Delivery Method: Circle system utilized Preoxygenation: Pre-oxygenation with 100% oxygen Induction Type: IV induction Ventilation: Mask ventilation without difficulty LMA: LMA inserted LMA Size: 4.0 Tube type: Oral Number of attempts: 1 Placement Confirmation: positive ETCO2 and breath sounds checked- equal and bilateral Tube secured with: Tape Dental Injury: Teeth and Oropharynx as per pre-operative assessment

## 2019-06-03 NOTE — Discharge Instructions (Signed)
1 - You may have urinary urgency (bladder spasms) and bloody urine on / off with stent in place. This is normal.  2 - Call MD or go to ER for fever >102, severe pain / nausea / vomiting not relieved by medications, or acute change in medical status  [facility] [phone] Post Ureteroscopy With  Stent Instructions  Definitions:  Ureter: The duct that transports urine from the kidney to the bladder. Stent:   A plastic hollow tube that is placed into the ureter, from the kidney to the bladder to prevent the ureter from swelling shut.  GENERAL INSTRUCTIONS:  Despite the fact that no skin incisions were used, the area around the ureter and bladder is raw and irritated. The stent is a foreign body which will further irritate the bladder wall. This irritation is manifested by increased frequency of urination, both day and night, and by an increase in the urge to urinate. In some, the urge to urinate is present almost always. Sometimes the urge is strong enough that you may not be able to stop yourself from urinating. The only real cure is to remove the stent and then give time for the bladder wall to heal which can't be done until the danger of the ureter swelling shut has passed, which varies.  You may see some blood in your urine while the stent is in place and a few days afterwards. Do not be alarmed, even if the urine was clear for a while. Get off your feet and drink lots of fluids until clearing occurs. If you start to pass clots or don't improve, call us.  DIET: You may return to your normal diet immediately. Because of the raw surface of your bladder, alcohol, spicy foods, acid type foods and drinks with caffeine may cause irritation or frequency and should be used in moderation. To keep your urine flowing freely and to avoid constipation, drink plenty of fluids during the day ( 8-10 glasses ). Tip: Avoid cranberry juice because it is very acidic.  ACTIVITY: Your physical  activity doesn't need to be restricted. However, if you are very active, you may see some blood in your urine. We suggest that you reduce your activity under these circumstances until the bleeding has stopped.  BOWELS: It is important to keep your bowels regular during the postoperative period. Straining with bowel movements can cause bleeding. A bowel movement every other day is reasonable. Use a mild laxative if needed, such as Milk of Magnesia 2-3 tablespoons, or 2 Dulcolax tablets. Call if you continue to have problems. If you have been taking narcotics for pain, before, during or after your surgery, you may be constipated. Take a laxative if necessary.   MEDICATION: You should resume your pre-surgery medications unless told not to. In addition you will often be given an antibiotic to prevent infection. These should be taken as prescribed until the bottles are finished unless you are having an unusual reaction to one of the drugs.  PROBLEMS YOU SHOULD REPORT TO US: Fevers over 100.5 Fahrenheit. Heavy bleeding, or clots ( See above notes about blood in urine ). Inability to urinate. Drug reactions ( hives, rash, nausea, vomiting, diarrhea ). Severe burning or pain with urination that is not improving.  FOLLOW-UP: You will need a follow-up appointment to monitor your progress. Call for this appointment at the number listed above. Usually the first appointment will be about three to fourteen days after your surgery.   Post Anesthesia Home Care Instructions    Activity: Get plenty of rest for the remainder of the day. A responsible individual must stay with you for 24 hours following the procedure.  For the next 24 hours, DO NOT: -Drive a car -Operate machinery -Drink alcoholic beverages -Take any medication unless instructed by your physician -Make any legal decisions or sign important papers.  Meals: Start with liquid foods such as gelatin or soup. Progress to regular foods as  tolerated. Avoid greasy, spicy, heavy foods. If nausea and/or vomiting occur, drink only clear liquids until the nausea and/or vomiting subsides. Call your physician if vomiting continues.  Special Instructions/Symptoms: Your throat may feel dry or sore from the anesthesia or the breathing tube placed in your throat during surgery. If this causes discomfort, gargle with warm salt water. The discomfort should disappear within 24 hours.          

## 2019-06-03 NOTE — Anesthesia Postprocedure Evaluation (Signed)
Anesthesia Post Note  Patient: [name] [name]  Procedure(s) Performed: CYSTOSCOPY WITH RETROGRADE PYELOGRAM/URETERAL STENT REPLACEMENT (Right Renal)     Patient location during evaluation: PACU Anesthesia Type: General Level of consciousness: awake and alert Pain management: pain level controlled Vital Signs Assessment: post-procedure vital signs reviewed and stable Respiratory status: spontaneous breathing, nonlabored ventilation, respiratory function stable and patient connected to nasal cannula oxygen Cardiovascular status: blood pressure returned to baseline and stable Postop Assessment: no apparent nausea or vomiting Anesthetic complications: no    Last Vitals:  Vitals:   06/03/19 1127 06/03/19 1210  BP: 123/75 (!) 115/59  Pulse:  63  Resp:  14  Temp:  36.8 C  SpO2:  94%    Last Pain:  Vitals:   06/03/19 1150  TempSrc:   PainSc: 0-No pain                 Catalina Gravel

## 2019-06-03 NOTE — Transfer of Care (Signed)
Immediate Anesthesia Transfer of Care Note  Patient: [name] [name]  Procedure(s) Performed: CYSTOSCOPY WITH RETROGRADE PYELOGRAM/URETERAL STENT REPLACEMENT (Right Renal)  Patient Location: PACU  Anesthesia Type:General  Level of Consciousness: awake, alert  and oriented  Airway & Oxygen Therapy: Patient Spontanous Breathing and Patient connected to nasal cannula oxygen  Post-op Assessment: Report given to RN and Post -op Vital signs reviewed and stable  Post vital signs: Reviewed and stable  Last Vitals:  Vitals Value Taken Time  BP    Temp    Pulse    Resp    SpO2      Last Pain:  Vitals:   06/03/19 0825  TempSrc: Oral  PainSc: 3       Patients Stated Pain Goal: 2 (98/06/99 9672)  Complications: No apparent anesthesia complications

## 2019-06-03 NOTE — Op Note (Signed)
NAMELORAIN, FETTES MEDICAL RECORD [id number] ACCOUNT [id number] DATE OF BIRTH:01-07-1941 FACILITY: [facility] LOCATION: [facility] PHYSICIAN:Adarius Tigges, MD  OPERATIVE REPORT  DATE OF PROCEDURE:  06/03/2019  PREOPERATIVE DIAGNOSIS:  Chronic right ureteral stricture, history of recurrent urinary infections.  PROCEDURE: 1.  Cystoscopy, right retrograde pyelogram, interpretation. 2.  Exchange of right ureteral stent 8 x 24 Polaris, no tether    ESTIMATED BLOOD LOSS:  Nil.  MEDICATIONS:  None.  SPECIMEN:  Right ureteral stent inspected and intact, discarded.  OPERATIVE FINDINGS: 1.  Chronic and significant pan right ureteral stricture and hydronephrosis, right retrograde pyelogram. 2.  Successful replacement of right ureteral stent, proximal end in upper pole, distal end in urinary bladder.  INDICATIONS:  The patient is a very pleasant but unfortunate and comorbid 78 year old lady with history of a chronic right ureteral stricture likely from a combination of retroperitoneal fibrosis and chronic strictures from stone procedures who is stent  dependent on the right side.  She also unfortunately has occasional urinary infections that have been previously severe.  She is stent dependent with a stent change every 3-6 months.  It has been approximately 4 months since her most recent stent change,  and she presents for this today in the elective fashion.  She does have some chronic bacteriuria but is fever free today, and she was given culture-specific antibiotics preoperatively.  Informed consent was obtained and placed in the medical record.  DESCRIPTION OF PROCEDURE:  The patient was identified, the procedure being right ureteral stent exchange was confirmed.  Procedure timeout was performed.  IV antibiotics were administered.  General anesthesia was induced.  The patient was placed in a low  lithotomy position.  A sterile field was created, prepping and draping the vagina, introitus,  and proximal thighs using iodine.  Cystourethroscopy was performed using a 21-French rigid cystoscope with offset lens.  Inspection of the bladder revealed the  distal end in the right ureteral stent in situ.  There was moderate encrustation.  It certainly appeared grossly due for exchange.  It was grasped and brought out in its entirety and set aside for discard.  It was inspected and intact.  The right  ureteral orifice was then cannulated with a 6-French renal catheter, and right retrograde pyelogram was obtained.  Right retrograde pyelogram demonstrated a single right ureter, single-system right kidney.  There was significant narrowing and medial deviation of the middle 2/3 of the right ureter.  There was minimal ability to pass contrast to the level of the  kidney.  A 0.038 ZIPwire was advanced to the level of the kidney over which the open-ended catheter was advanced, and a minimal amount of contrast was placed into the kidney to allow for dilatation for stent placement only under very gentle retrograde  pressure.  Significant hydronephrosis, moderate in nature, was noted and appeared to be stable.  A new 8 x 24 Polaris-type stent was placed over the ZIP working wire using cystoscopic and fluoroscopic guidance.  Good proximal and distal planes were  noted.  Efflux of urine was seen around and through the distal end of the stent.  The bladder was entered per cystoscope.  The procedure was then terminated.  The patient tolerated the procedure well.  No immediate perioperative complications.  The  patient was taken to postanesthesia care in stable condition with plan for discharge home.  LN/NUANCE  D:06/03/2019 T:06/03/2019 JOB:[id number]

## 2019-06-03 NOTE — H&P (Signed)
[name] [name] is an 78 y.o. female.    Chief Complaint: Pre-Op RIGHT Ureteral Stent Change  HPI:   1 - Rt Chronic Hydronephrosis / Partial Ureteral Stricture- Multiple prior stone procedures on right including SWL, ureteroscopy, and neph tube. Concern for high grade distal stricure 05/2013 during episode of urosepsis treated with neph tube by antegrade nephrostogram. Formal diagnostic ureteroscopy 02/2014 with several areas of relative narrowing (distal and proximal) but NO frank stricture (accomodated 47F sheath to UPJ).    Recent Course:   05/2016 - Renogram Rt 42% / Lt 58% relative function  10/2016 - Exchange Rt stent 7x24 polaris; 04/2017 Exchange Rt stent 7x24 polaris; 09/2017 Exchange Rt stent 7x24 plaris  02/2018 - Exchange Rt 8x24 polaris stent; 08/2018 Exchange Rt 8x24 Polaris; 12/2018 Exchange Rt 8x24 Polaris.    2 - Recurrent Nephrolithiasis - Prior composition CaPO4, CaOx   Pre 2014 - URS x several, open ureterolithotomy on Left, SWL on right x2  02/2019 - CT stone free    3 - Recurrent Urinary Tract Infection / Right Perinephric Abscess - Several episodes of simple cystitis as well as right pyelo / perinephric abscess 12/2018. Pelvic 02/2014 atrophic vaginitis, reduced prolapse with well-sized pessary. PVR 02/2014 "21mL" UCX 03/2019 e. coli sens keflex, gent, nitro, Res Cipro.    PMH sig for AFib / Thrombocytosis / Eliquus (primary prevention only, cardioversion 2018) , lap chole, hyst, left open ureterolithotomy, ureteroscopy x several, shockwave lithotripsy.    Today Pier is seen to proceed with RIGHT ureteral stent change. No interval fevers. Most recent UCX with some e. Coli colonization res FQ.     Past Medical History:  Diagnosis Date  . Abdominal aortic atherosclerosis ([place]) 05/07/2016   Noted on CT abd/pelvis  . Anticoagulant long-term use    ELIQUIS  . Chronic diastolic (congestive) heart failure ([place])   . Coronary artery disease   . Cystocele, midline     followed by dr c. [facility] in [place])  pt uses pessary  . Diverticulosis 05/07/2016   Noted on CT abd/pelvis  . DOE (dyspnea on exertion)   . First degree heart block   . General weakness   . GERD (gastroesophageal reflux disease)   . Hematuria    intermittently due to ureter right stent  . Hiatal hernia   . History of arteriovenostomy for renal dialysis ([place])   . History of colon polyps   . History of gastric polyp   . History of kidney infection    12-31-2018  perinephrtic abscess  right side  s/p drains x2 01-10-2019,  05/ 2020 drains removed  . History of kidney stones   . History of peptic ulcer disease   . History of septic shock    2011 (pt unaware) and 06-04-2013  w/ acute renal failure  . History of uterine cancer    STAGE I  --  S/P TAH W/ BSO  (NO OTHER TX)  . History of ventilator dependency ([place]) 2014   in setting of septic shock  . Hyperlipidemia   . Hypertension   . Iron deficiency anemia hematology/ oncology--- dr Bobby Rumpf at [facility] in [place]--- per lov note 08-07-2017  stabilized and felt to be more chronic anemia   01/ 2018  dx iron def. anemia  s/p  IV Iron infusion and taking oral iron supplement  . Lumbar compression fracture ([place])    01-22-2019 per imaging L1  (06-01-2019 per pt currently no pain)  . PAF (  paroxysmal atrial fibrillation) ([place]) DX JUNE 2012   CARDIOLOGIST--  DR Vernie Shanks--- [facility])  CHADS2  . Presence of pessary   . Right wrist fracture    per pt fractured on 03-10-2019,  had closed reduction and wearing a brace  . Ureteral stricture, right urologist-- dr Tresa Moore   Chronic--- treated with ureteral stent  . Uses walker    and wheelchair for longer distance  . Wears glasses     Past Surgical History:  Procedure Laterality Date  . CARDIOVERSION  09/ 2018   dr Agustin Cree   successful ([facility] )  . CHOLECYSTECTOMY  2010  . COLONOSCOPY W/ POLYPECTOMY  05/2017  .  CYSTO/  BALLOON DILATION RIGHT URETERAL STRICTURE/ STENT PLACEMENT  06-02-2013  . CYSTO/  BILATERAL RETROGRADE PYELOGRAM/ RIGHT URETEROSCOPY AND STENT PLACEMENT  10-04-2005  . CYSTO/ LEFT URETEROSCOPIC STONE EXTRACTION  04-03-2006  . CYSTOSCOPY W/ RETROGRADES Right 01/03/2019   Procedure: CYSTOSCOPY WITH RETROGRADE PYELOGRAM RIGHT WITH STENT EXCHANGE;  Surgeon: Irine Seal, MD;  Location: [facility];  Service: Urology;  Laterality: Right;  . CYSTOSCOPY W/ URETERAL STENT PLACEMENT Right 02/06/2014   Procedure: CYSTOSCOPY WITH RIGHT RETROGRADE PYELOGRAM RIGHT STENT REMOVAL and REPLACEMENT, Right Ureteroscopy;  Surgeon: Ailene Rud, MD;  Location[facility];  Service: Urology;  Laterality: Right;  . CYSTOSCOPY W/ URETERAL STENT PLACEMENT Right 11/26/2016   Procedure: CYSTOSCOPY WITH RETROGRADE PYELOGRAM/URETERAL STENT REPLACEMENT;  Surgeon: Alexis Frock, MD;  Location: [facility];  Service: Urology;  Laterality: Right;  . CYSTOSCOPY W/ URETERAL STENT PLACEMENT Right 04/24/2017   Procedure: CYSTOSCOPY WITH RETROGRADE PYELOGRAM/URETERAL STENT REPLACEMENT;  Surgeon: Alexis Frock, MD;  Location: [facility];  Service: Urology;  Laterality: Right;  . CYSTOSCOPY W/ URETERAL STENT PLACEMENT Right 10/07/2017   Procedure: CYSTOSCOPY WITH RETROGRADE PYELOGRAM/URETERAL STENT EXCHANGE;  Surgeon: Alexis Frock, MD;  Location: [facility];  Service: Urology;  Laterality: Right;  . CYSTOSCOPY W/ URETERAL STENT PLACEMENT Right 03/03/2018   Procedure: CYSTOSCOPY WITH RIGHT RETROGRADE / RIGHT URETERAL STENT EXCHANGE;  Surgeon: Alexis Frock, MD;  Location: [facility];  Service: Urology;  Laterality: Right;  . CYSTOSCOPY W/ URETERAL STENT PLACEMENT Right 09/22/2018   Procedure: CYSTOSCOPY WITH RETROGRADE PYELOGRAM/URETERAL STENT PLACEMENT;  Surgeon: Alexis Frock, MD;  Location: [facility];  Service: Urology;  Laterality: Right;  45 MINS   . CYSTOSCOPY WITH LITHOLAPAXY N/A 11/21/2013   Procedure: CYSTOSCOPY WITH LITHOLAPAXY;  Surgeon: Ailene Rud, MD;  Location: [facility];  Service: Urology;  Laterality: N/A;  . CYSTOSCOPY WITH RETROGRADE PYELOGRAM, URETEROSCOPY AND STENT PLACEMENT Bilateral 03/15/2014   Procedure: CYSTOSCOPY WITH BILATERAL RETROGRADE PYELOGRAM, RIGHT DIAGNOSTIC URETEROSCOPY AND STENT EXCHANGE;  Surgeon: Alexis Frock, MD;  Location: [facility];  Service: Urology;  Laterality: Bilateral;  . CYSTOSCOPY WITH STENT PLACEMENT Right 05/07/2016   Procedure: CYSTOSCOPY WITH RETROGRADE PYELOGRAM, URETERAL STENT PLACEMENT;  Surgeon: Franchot Gallo, MD;  Location: [facility];  Service: Urology;  Laterality: Right;  . HEMIARTHROPLASTY HIP Right 11/2016   fractured  . IR CATHETER TUBE CHANGE  02/04/2019  . IR CATHETER TUBE CHANGE  02/04/2019  . IR CATHETER TUBE CHANGE  02/11/2019  . IR RADIOLOGIST EVAL & MGMT  03/09/2019  . LUMBAR FUSION  JULY 2013  . NEPHROLITHOTOMY  X2 YRS AGO  . PERCUTANEOUS NEPHROSTOMY  2014  . TOTAL ABDOMINAL HYSTERECTOMY W/ BILATERAL SALPINGOOPHORECTOMY  1989  . TOTAL KNEE ARTHROPLASTY Bilateral 1992  &  1996  . TRANSTHORACIC  ECHOCARDIOGRAM  07-24-2017   dr Agustin Cree   ef 60-65% (improved from last echo 2014, was 45-50%)/  trace TR/  mild AV sclerosis without stenosis  . URETEROSCOPY Right 11/21/2013   Procedure: URETEROSCOPY WITH STENT REMOVAL AND REPLACEMENT;  Surgeon: Ailene Rud, MD;  Location: [facility];  Service: Urology;  Laterality: Right;    Family History  Problem Relation Age of Onset  . Heart disease Mother   . Heart failure Mother   . Cancer Mother   . Heart disease Father   . Heart failure Father    Social History:  reports that she has never smoked. She has never used smokeless tobacco. She reports that she does not drink alcohol or use drugs.  Allergies:  Allergies  Allergen Reactions  . Codeine Nausea Only and Other (See Comments)     hallucinations  . Hydrocodone Nausea Only and Other (See Comments)  . Macrodantin [Nitrofurantoin] Nausea And Vomiting    No medications prior to admission.    No results found for this or any previous visit (from the past 48 hour(s)). No results found.  Review of Systems  Constitutional: Negative for chills and fever.  All other systems reviewed and are negative.   Height 5\' 7"  (1.702 m), weight 75.8 kg. Physical Exam  Constitutional:  Moderate fraily, very pleasant, at baseline  Neck: Normal range of motion.  Respiratory: Effort normal.  GI: Soft.  Genitourinary:    Genitourinary Comments: No CVAT at present.    Musculoskeletal: Normal range of motion.  Neurological: She is alert.  Psychiatric: She has a normal mood and affect.     Assessment/Plan  Proceed as planned with RIGHT ureteral stent exchange. Risks, benefits, alternatives, expected peri-op course discussed previously and reiterated today. Norva Karvonen peri-op based on most recent CX data.   Alexis Frock, MD 06/03/2019, 5:31 AM

## 2019-06-03 NOTE — Brief Op Note (Signed)
06/03/2019  10:44 AM  PATIENT:  [name] [name]  78 y.o. female  PRE-OPERATIVE DIAGNOSIS:  RIGHT URETERAL STRICTURE  POST-OPERATIVE DIAGNOSIS:  RIGHT URETERAL STRICTURE  PROCEDURE:  Procedure(s): CYSTOSCOPY WITH RETROGRADE PYELOGRAM/URETERAL STENT REPLACEMENT (Right)  SURGEON:  Surgeon(s) and Role:    * Alexis Frock, MD - Primary  PHYSICIAN ASSISTANT:   ASSISTANTS: none   ANESTHESIA:   general  EBL:  mnimal   BLOOD ADMINISTERED:none  DRAINS: none   LOCAL MEDICATIONS USED:  NONE  SPECIMEN:  Source of Specimen:  Rt ureteral stent  DISPOSITION OF SPECIMEN:  discard  COUNTS:  YES  TOURNIQUET:  * No tourniquets in log *  DICTATION: .Other Dictation: Dictation Number [id number]  PLAN OF CARE: Discharge to home after PACU  PATIENT DISPOSITION:  PACU - hemodynamically stable.   Delay start of Pharmacological VTE agent (>24hrs) due to surgical blood loss or risk of bleeding: yes

## 2019-06-06 ENCOUNTER — Encounter (HOSPITAL_BASED_OUTPATIENT_CLINIC_OR_DEPARTMENT_OTHER): Payer: Self-pay | Admitting: Urology

## 2019-06-06 DIAGNOSIS — S32008D Other fracture of unspecified lumbar vertebra, subsequent encounter for fracture with routine healing: Secondary | ICD-10-CM | POA: Diagnosis not present

## 2019-06-06 DIAGNOSIS — I480 Paroxysmal atrial fibrillation: Secondary | ICD-10-CM | POA: Diagnosis not present

## 2019-06-06 DIAGNOSIS — I5032 Chronic diastolic (congestive) heart failure: Secondary | ICD-10-CM | POA: Diagnosis not present

## 2019-06-06 DIAGNOSIS — S6291XD Unspecified fracture of right wrist and hand, subsequent encounter for fracture with routine healing: Secondary | ICD-10-CM | POA: Diagnosis not present

## 2019-06-06 DIAGNOSIS — N189 Chronic kidney disease, unspecified: Secondary | ICD-10-CM | POA: Diagnosis not present

## 2019-06-06 DIAGNOSIS — I130 Hypertensive heart and chronic kidney disease with heart failure and stage 1 through stage 4 chronic kidney disease, or unspecified chronic kidney disease: Secondary | ICD-10-CM | POA: Diagnosis not present

## 2019-06-13 ENCOUNTER — Other Ambulatory Visit: Payer: Self-pay | Admitting: Cardiology

## 2019-06-14 DIAGNOSIS — S32008D Other fracture of unspecified lumbar vertebra, subsequent encounter for fracture with routine healing: Secondary | ICD-10-CM | POA: Diagnosis not present

## 2019-06-14 DIAGNOSIS — N189 Chronic kidney disease, unspecified: Secondary | ICD-10-CM | POA: Diagnosis not present

## 2019-06-14 DIAGNOSIS — S6291XD Unspecified fracture of right wrist and hand, subsequent encounter for fracture with routine healing: Secondary | ICD-10-CM | POA: Diagnosis not present

## 2019-06-14 DIAGNOSIS — I5032 Chronic diastolic (congestive) heart failure: Secondary | ICD-10-CM | POA: Diagnosis not present

## 2019-06-14 DIAGNOSIS — I130 Hypertensive heart and chronic kidney disease with heart failure and stage 1 through stage 4 chronic kidney disease, or unspecified chronic kidney disease: Secondary | ICD-10-CM | POA: Diagnosis not present

## 2019-06-14 DIAGNOSIS — I480 Paroxysmal atrial fibrillation: Secondary | ICD-10-CM | POA: Diagnosis not present

## 2019-06-16 DIAGNOSIS — B351 Tinea unguium: Secondary | ICD-10-CM | POA: Diagnosis not present

## 2019-06-16 DIAGNOSIS — R351 Nocturia: Secondary | ICD-10-CM | POA: Diagnosis not present

## 2019-06-16 DIAGNOSIS — L219 Seborrheic dermatitis, unspecified: Secondary | ICD-10-CM | POA: Diagnosis not present

## 2019-06-29 DIAGNOSIS — R1084 Generalized abdominal pain: Secondary | ICD-10-CM | POA: Diagnosis not present

## 2019-06-29 DIAGNOSIS — N3020 Other chronic cystitis without hematuria: Secondary | ICD-10-CM | POA: Diagnosis not present

## 2019-07-05 DIAGNOSIS — Z23 Encounter for immunization: Secondary | ICD-10-CM | POA: Diagnosis not present

## 2019-07-05 DIAGNOSIS — K219 Gastro-esophageal reflux disease without esophagitis: Secondary | ICD-10-CM | POA: Diagnosis not present

## 2019-07-05 DIAGNOSIS — E785 Hyperlipidemia, unspecified: Secondary | ICD-10-CM | POA: Diagnosis not present

## 2019-07-05 DIAGNOSIS — I4891 Unspecified atrial fibrillation: Secondary | ICD-10-CM | POA: Diagnosis not present

## 2019-07-05 DIAGNOSIS — Z79899 Other long term (current) drug therapy: Secondary | ICD-10-CM | POA: Diagnosis not present

## 2019-07-05 DIAGNOSIS — Z6828 Body mass index (BMI) 28.0-28.9, adult: Secondary | ICD-10-CM | POA: Diagnosis not present

## 2019-07-12 DIAGNOSIS — N1330 Unspecified hydronephrosis: Secondary | ICD-10-CM | POA: Diagnosis not present

## 2019-07-12 DIAGNOSIS — N3020 Other chronic cystitis without hematuria: Secondary | ICD-10-CM | POA: Diagnosis not present

## 2019-07-18 DIAGNOSIS — L219 Seborrheic dermatitis, unspecified: Secondary | ICD-10-CM | POA: Diagnosis not present

## 2019-07-18 DIAGNOSIS — L728 Other follicular cysts of the skin and subcutaneous tissue: Secondary | ICD-10-CM | POA: Diagnosis not present

## 2019-07-28 DIAGNOSIS — N3020 Other chronic cystitis without hematuria: Secondary | ICD-10-CM | POA: Diagnosis not present

## 2019-07-28 DIAGNOSIS — N1330 Unspecified hydronephrosis: Secondary | ICD-10-CM | POA: Diagnosis not present

## 2019-08-01 ENCOUNTER — Other Ambulatory Visit (HOSPITAL_COMMUNITY): Payer: Self-pay | Admitting: Urology

## 2019-08-01 DIAGNOSIS — N1330 Unspecified hydronephrosis: Secondary | ICD-10-CM

## 2019-08-01 DIAGNOSIS — N3020 Other chronic cystitis without hematuria: Secondary | ICD-10-CM | POA: Diagnosis not present

## 2019-08-01 DIAGNOSIS — N200 Calculus of kidney: Secondary | ICD-10-CM | POA: Diagnosis not present

## 2019-08-01 DIAGNOSIS — N952 Postmenopausal atrophic vaginitis: Secondary | ICD-10-CM | POA: Diagnosis not present

## 2019-08-01 DIAGNOSIS — R300 Dysuria: Secondary | ICD-10-CM | POA: Diagnosis not present

## 2019-08-03 ENCOUNTER — Other Ambulatory Visit: Payer: Self-pay | Admitting: Cardiology

## 2019-08-11 ENCOUNTER — Ambulatory Visit: Payer: Medicare Other | Admitting: Cardiology

## 2019-08-23 ENCOUNTER — Ambulatory Visit: Payer: Medicare Other | Admitting: Cardiology

## 2019-08-25 ENCOUNTER — Telehealth (INDEPENDENT_AMBULATORY_CARE_PROVIDER_SITE_OTHER): Payer: Medicare Other | Admitting: Cardiology

## 2019-08-25 ENCOUNTER — Encounter: Payer: Self-pay | Admitting: Cardiology

## 2019-08-25 ENCOUNTER — Other Ambulatory Visit: Payer: Self-pay

## 2019-08-25 VITALS — BP 105/60 | Wt 168.0 lb

## 2019-08-25 DIAGNOSIS — I480 Paroxysmal atrial fibrillation: Secondary | ICD-10-CM

## 2019-08-25 DIAGNOSIS — I5032 Chronic diastolic (congestive) heart failure: Secondary | ICD-10-CM

## 2019-08-25 DIAGNOSIS — Z79899 Other long term (current) drug therapy: Secondary | ICD-10-CM

## 2019-08-25 DIAGNOSIS — R002 Palpitations: Secondary | ICD-10-CM

## 2019-08-25 DIAGNOSIS — I2510 Atherosclerotic heart disease of native coronary artery without angina pectoris: Secondary | ICD-10-CM

## 2019-08-25 NOTE — Progress Notes (Signed)
Virtual Visit via Telephone Note   This visit type was conducted due to national recommendations for restrictions regarding the COVID-19 Pandemic (e.g. social distancing) in an effort to limit this patient's exposure and mitigate transmission in our community.  Due to her co-morbid illnesses, this patient is at least at moderate risk for complications without adequate follow up.  This format is felt to be most appropriate for this patient at this time.  The patient did not have access to video technology/had technical difficulties with video requiring transitioning to audio format only (telephone).  All issues noted in this document were discussed and addressed.  No physical exam could be performed with this format.  Please refer to the patient's chart for her  consent to telehealth for [facility].  Evaluation Performed:  Follow-up visit  This visit type was conducted due to national recommendations for restrictions regarding the COVID-19 Pandemic (e.g. social distancing).  This format is felt to be most appropriate for this patient at this time.  All issues noted in this document were discussed and addressed.  No physical exam was performed (except for noted visual exam findings with Video Visits).  Please refer to the patient's chart (MyChart message for video visits and phone note for telephone visits) for the patient's consent to telehealth for [facility]  Date:  08/25/2019  ID: [name] [name], DOB 05-18-1941, MRN [id number]   Patient Location: [place] [address]   Provider location:   [place] Office  PCP:  [name], MD  Cardiologist:  Jenne Campus, MD     Chief Complaint: Doing well  History of Present Illness:    [name] [name] is a 78 y.o. female  who presents via audio/video conferencing for a telehealth visit today.  With paroxysmal atrial fibrillation seems to be successfully suppressed with flecainide as well as oral  anticoagulation does have a televisit with me today.  Unable to establish video link therefore we talk over the phone overall she is doing well cardiac wise denies having a palpitation but she did notice sometimes she feels very weak when she check her heart rate and blood pressure will be low.  Denies having any chest pain tightness squeezing pressure burning chest.  She is supposed to have a renogram done trying to determine functionality of the kidneys that she have so much problem with the idea is if this kidney is dysfunctional she need to have nephrectomy because of frequent urinary tract infection and and repeated stenting.  If this is the case we need to reassess her heart and at least do echocardiogram.   The patient does not have symptoms concerning for COVID-19 infection (fever, chills, cough, or new SHORTNESS OF BREATH).    Prior CV studies:   The following studies were reviewed today:       Past Medical History:  Diagnosis Date  . Abdominal aortic atherosclerosis ([place]) 05/07/2016   Noted on CT abd/pelvis  . Anticoagulant long-term use    ELIQUIS  . Chronic diastolic (congestive) heart failure ([place])   . Coronary artery disease   . Cystocele, midline    followed by dr c. Marvel [facility] in [place])  pt uses pessary  . Diverticulosis 05/07/2016   Noted on CT abd/pelvis  . DOE (dyspnea on exertion)   . First degree heart block   . General weakness   . GERD (gastroesophageal reflux disease)   . Hematuria    intermittently due  to ureter right stent  . Hiatal hernia   . History of arteriovenostomy for renal dialysis ([place])   . History of colon polyps   . History of gastric polyp   . History of kidney infection    12-31-2018  perinephrtic abscess  right side  s/p drains x2 01-10-2019,  05/ 2020 drains removed  . History of kidney stones   . History of peptic ulcer disease   . History of septic shock    2011 (pt unaware) and 06-04-2013  w/ acute  renal failure  . History of uterine cancer    STAGE I  --  S/P TAH W/ BSO  (NO OTHER TX)  . History of ventilator dependency ([place]) 2014   in setting of septic shock  . Hyperlipidemia   . Hypertension   . Iron deficiency anemia hematology/ oncology--- dr Bobby Rumpf at [facility] in [place]--- per lov note 08-07-2017  stabilized and felt to be more chronic anemia   01/ 2018  dx iron def. anemia  s/p  IV Iron infusion and taking oral iron supplement  . Lumbar compression fracture ([place])    01-22-2019 per imaging L1  (06-01-2019 per pt currently no pain)  . PAF (paroxysmal atrial fibrillation) ([place]) DX JUNE 2012   CARDIOLOGIST--  DR Vernie Shanks--- [place] [facility]ology)  CHADS2  . Presence of pessary   . Right wrist fracture    per pt fractured on 03-10-2019,  had closed reduction and wearing a brace  . Ureteral stricture, right urologist-- dr Tresa Moore   Chronic--- treated with ureteral stent  . Uses walker    and wheelchair for longer distance  . Wears glasses     Past Surgical History:  Procedure Laterality Date  . CARDIOVERSION  09/ 2018   dr Agustin Cree   successful (NSR )  . CHOLECYSTECTOMY  2010  . COLONOSCOPY W/ POLYPECTOMY  05/2017  . CYSTO/  BALLOON DILATION RIGHT URETERAL STRICTURE/ STENT PLACEMENT  06-02-2013  . CYSTO/  BILATERAL RETROGRADE PYELOGRAM/ RIGHT URETEROSCOPY AND STENT PLACEMENT  10-04-2005  . CYSTO/ LEFT URETEROSCOPIC STONE EXTRACTION  04-03-2006  . CYSTOSCOPY W/ RETROGRADES Right 01/03/2019   Procedure: CYSTOSCOPY WITH RETROGRADE PYELOGRAM RIGHT WITH STENT EXCHANGE;  Surgeon: Irine Seal, MD;  [facility];  Service: Urology;  Laterality: Right;  . CYSTOSCOPY W/ URETERAL STENT PLACEMENT Right 02/06/2014   Procedure: CYSTOSCOPY WITH RIGHT RETROGRADE PYELOGRAM RIGHT STENT REMOVAL and REPLACEMENT, Right Ureteroscopy;  Surgeon: Ailene Rud, MD;  Location: [facility];  Service: Urology;  Laterality: Right;  . CYSTOSCOPY  W/ URETERAL STENT PLACEMENT Right 11/26/2016   Procedure: CYSTOSCOPY WITH RETROGRADE PYELOGRAM/URETERAL STENT REPLACEMENT;  Surgeon: Alexis Frock, MD;  Location[facility];  Service: Urology;  Laterality: Right;  . CYSTOSCOPY W/ URETERAL STENT PLACEMENT Right 04/24/2017   Procedure: CYSTOSCOPY WITH RETROGRADE PYELOGRAM/URETERAL STENT REPLACEMENT;  Surgeon: Alexis Frock, MD;  Location: [facility];  Service: Urology;  Laterality: Right;  . CYSTOSCOPY W/ URETERAL STENT PLACEMENT Right 10/07/2017   Procedure: CYSTOSCOPY WITH RETROGRADE PYELOGRAM/URETERAL STENT EXCHANGE;  Surgeon: Alexis Frock, MD;  Location: [facility];  Service: Urology;  Laterality: Right;  . CYSTOSCOPY W/ URETERAL STENT PLACEMENT Right 03/03/2018   Procedure: CYSTOSCOPY WITH RIGHT RETROGRADE / RIGHT URETERAL STENT EXCHANGE;  Surgeon: Alexis Frock, MD;  [facility];  Service: Urology;  Laterality: Right;  . CYSTOSCOPY W/ URETERAL STENT PLACEMENT Right 09/22/2018   Procedure: CYSTOSCOPY WITH RETROGRADE PYELOGRAM/URETERAL STENT PLACEMENT;  Surgeon:  Alexis Frock, MD;  [facility];  Service: Urology;  Laterality: Right;  45 MINS  . CYSTOSCOPY W/ URETERAL STENT PLACEMENT Right 06/03/2019   Procedure: CYSTOSCOPY WITH RETROGRADE PYELOGRAM/URETERAL STENT REPLACEMENT;  Surgeon: Alexis Frock, MD;  Location: [facility];  Service: Urology;  Laterality: Right;  . CYSTOSCOPY WITH LITHOLAPAXY N/A 11/21/2013   Procedure: CYSTOSCOPY WITH LITHOLAPAXY;  Surgeon: Ailene Rud, MD;  Location: [facility];  Service: Urology;  Laterality: N/A;  . CYSTOSCOPY WITH RETROGRADE PYELOGRAM, URETEROSCOPY AND STENT PLACEMENT Bilateral 03/15/2014   Procedure: CYSTOSCOPY WITH BILATERAL RETROGRADE PYELOGRAM, RIGHT DIAGNOSTIC URETEROSCOPY AND STENT EXCHANGE;  Surgeon: Alexis Frock, MD;  [facility];  Service: Urology;  Laterality:  Bilateral;  . CYSTOSCOPY WITH STENT PLACEMENT Right 05/07/2016   Procedure: CYSTOSCOPY WITH RETROGRADE PYELOGRAM, URETERAL STENT PLACEMENT;  Surgeon: Franchot Gallo, MD;  [facility];  Service: Urology;  Laterality: Right;  . HEMIARTHROPLASTY HIP Right 11/2016   fractured  . IR CATHETER TUBE CHANGE  02/04/2019  . IR CATHETER TUBE CHANGE  02/04/2019  . IR CATHETER TUBE CHANGE  02/11/2019  . IR RADIOLOGIST EVAL & MGMT  03/09/2019  . LUMBAR FUSION  JULY 2013  . NEPHROLITHOTOMY  X2 YRS AGO  . PERCUTANEOUS NEPHROSTOMY  2014  . TOTAL ABDOMINAL HYSTERECTOMY W/ BILATERAL SALPINGOOPHORECTOMY  1989  . TOTAL KNEE ARTHROPLASTY Bilateral 1992  &  1996  . TRANSTHORACIC ECHOCARDIOGRAM  07-24-2017   dr Agustin Cree   ef 60-65% (improved from last echo 2014, was 45-50%)/  trace TR/  mild AV sclerosis without stenosis  . URETEROSCOPY Right 11/21/2013   Procedure: URETEROSCOPY WITH STENT REMOVAL AND REPLACEMENT;  Surgeon: Ailene Rud, MD;  Location: [facility];  Service: Urology;  Laterality: Right;     Current Meds  Medication Sig  . acetaminophen (TYLENOL) 500 MG tablet Take 1,000 mg by mouth 2 (two) times daily as needed for moderate pain or headache.  [name] amitriptyline (ELAVIL) 25 MG tablet Take 25 mg by mouth at bedtime.  . carvedilol (COREG) 6.25 MG tablet TAKE 1/2 TABLET BY MOUTH TWICE DAILY  . clotrimazole-betamethasone (LOTRISONE) cream Apply 1 application topically 2 (two) times daily as needed for rash.  . diltiazem (CARDIZEM CD) 120 MG 24 hr capsule Take 1 capsule (120 mg total) by mouth every morning. (Patient taking differently: Take 120 mg by mouth every morning. )  . ELIQUIS 5 MG TABS tablet TAKE ONE TABLET BY MOUTH TWICE DAILY  . flecainide (TAMBOCOR) 50 MG tablet Take 1.5 tablets (75 mg total) by mouth 2 (two) times daily.  . Meth-Hyo-M Bl-Na Phos-Ph Sal (URO-MP PO) Take 1 capsule by mouth 2 (two) times daily.  [name] omeprazole (PRILOSEC) 40 MG capsule Take 40 mg by  mouth every morning.   . ondansetron (ZOFRAN-ODT) 4 MG disintegrating tablet Take 4 mg by mouth every 6 (six) hours as needed for nausea/vomiting.  . triamcinolone cream (KENALOG) 0.1 % Apply 1 application topically 2 (two) times daily as needed for rash.      Family History: The patient's family history includes Cancer in her mother; Heart disease in her father and mother; Heart failure in her father and mother.   ROS:   Please see the history of present illness.     All other systems reviewed and are negative.   Labs/Other Tests and Data Reviewed:     Recent Labs: 01/07/2019: ALT 13 01/12/2019: Magnesium 1.7 01/13/2019: Platelets 536 06/03/2019: BUN 19; Creatinine, Ser 0.80; Hemoglobin  16.0; Potassium 4.2; Sodium 138  Recent Lipid Panel No results found for: CHOL, TRIG, HDL, CHOLHDL, VLDL, LDLCALC, LDLDIRECT    Exam:    Vital Signs:  BP 105/60   Wt 168 lb (76.2 kg)   BMI 26.31 kg/m     Wt Readings from Last 3 Encounters:  08/25/19 168 lb (76.2 kg)  06/03/19 164 lb 8 oz (74.6 kg)  04/11/19 175 lb (79.4 kg)     Well nourished, well developed in no acute distress. Alert awake and x3 not in distress talking to me over the phone happy to be able to talk to me disappointed that she did not see me in the office  Diagnosis for this visit:   1. Paroxysmal atrial fibrillation (HCC)   2. Chronic diastolic congestive heart failure ([place])   3. Coronary artery disease involving native coronary artery of native heart without angina pectoris   4. Palpitations   5. High risk medication use      ASSESSMENT & PLAN:    1.  Paroxysmal atrial fibrillation anticoagulated continue flecainide ask her to discontinue carvedilol because of low blood pressure as well as slow heart rate 2.  Diastolic congestive heart failure compensated 3.  Coronary disease stable. 4.  Palpitations doing well from that point.  Denies having any 5.  High risk medication use continue present management   COVID-19 Education: The signs and symptoms of COVID-19 were discussed with the patient and how to seek care for testing (follow up with PCP or arrange E-visit).  The importance of social distancing was discussed today.  Patient Risk:   After full review of this patients clinical status, I feel that they are at least moderate risk at this time.  Time:   Today, I have spent 5 minutes with the patient with telehealth technology discussing pt health issues.  I spent 50minutes reviewing her chart before the visit.    Medication Adjustments/Labs and Tests Ordered: Current medicines are reviewed at length with the patient today.  Concerns regarding medicines are outlined above.  No orders of the defined types were placed in this encounter.  Medication changes: No orders of the defined types were placed in this encounter.    Disposition:  3 months  Signed, Park Liter, MD, [facility] 08/25/2019 2:24 PM    [place]

## 2019-08-25 NOTE — Patient Instructions (Signed)
Medication Instructions:  Your physician recommends that you continue on your current medications as directed. Please refer to the Current Medication list given to you today.  *If you need a refill on your cardiac medications before your next appointment, please call your pharmacy*  Lab Work: Non If you have labs (blood work) drawn today and your tests are completely normal, you will receive your results only by: [name] MyChart Message (if you have MyChart) OR . A paper copy in the mail If you have any lab test that is abnormal or we need to change your treatment, we will call you to review the results.  Testing/Procedures: None.   Follow-Up: At [facility], you and your health needs are our priority.  As part of our continuing mission to provide you with exceptional heart care, we have created designated Provider Care Teams.  These Care Teams include your primary Cardiologist (physician) and Advanced Practice Providers (APPs -  Physician Assistants and Nurse Practitioners) who all work together to provide you with the care you need, when you need it.  Your next appointment:   4 months  The format for your next appointment:   In Person  Provider:   You will see Dr. Agustin Cree.   Or, you can be scheduled with the following Advanced Practice Provider on your designated Care Team (at our [facility]):  Laurann Montana, FNP    Other Instructions

## 2019-09-15 DIAGNOSIS — D473 Essential (hemorrhagic) thrombocythemia: Secondary | ICD-10-CM | POA: Diagnosis not present

## 2019-09-15 DIAGNOSIS — D509 Iron deficiency anemia, unspecified: Secondary | ICD-10-CM | POA: Diagnosis not present

## 2019-09-15 DIAGNOSIS — Z862 Personal history of diseases of the blood and blood-forming organs and certain disorders involving the immune mechanism: Secondary | ICD-10-CM | POA: Diagnosis not present

## 2019-09-26 ENCOUNTER — Other Ambulatory Visit: Payer: Self-pay

## 2019-09-26 ENCOUNTER — Ambulatory Visit (HOSPITAL_COMMUNITY)
Admission: RE | Admit: 2019-09-26 | Discharge: 2019-09-26 | Disposition: A | Discharge status: Home / Self Care | Payer: Medicare Other | Source: Ambulatory Visit | Attending: Urology | Admitting: Urology

## 2019-09-26 DIAGNOSIS — N1330 Unspecified hydronephrosis: Secondary | ICD-10-CM | POA: Diagnosis not present

## 2019-09-26 MED ORDER — FUROSEMIDE 10 MG/ML IJ SOLN
INTRAMUSCULAR | Status: AC
  Filled 2019-09-26: qty 4

## 2019-09-26 MED ORDER — TECHNETIUM TC 99M MERTIATIDE
5.0200 | Freq: Once | INTRAVENOUS | Status: AC | PRN
  Administered 2019-09-26: 5.02 via INTRAVENOUS

## 2019-09-26 MED ORDER — FUROSEMIDE 10 MG/ML IJ SOLN: 38.1000 mg | Freq: Once | INTRAMUSCULAR | Status: DC

## 2019-09-27 DIAGNOSIS — N3020 Other chronic cystitis without hematuria: Secondary | ICD-10-CM | POA: Diagnosis not present

## 2019-09-27 DIAGNOSIS — N200 Calculus of kidney: Secondary | ICD-10-CM | POA: Diagnosis not present

## 2019-09-27 DIAGNOSIS — N1330 Unspecified hydronephrosis: Secondary | ICD-10-CM | POA: Diagnosis not present

## 2019-10-14 DIAGNOSIS — N3000 Acute cystitis without hematuria: Secondary | ICD-10-CM | POA: Diagnosis not present

## 2019-11-02 ENCOUNTER — Other Ambulatory Visit: Payer: Self-pay | Admitting: Cardiology

## 2019-11-03 DIAGNOSIS — D509 Iron deficiency anemia, unspecified: Secondary | ICD-10-CM | POA: Diagnosis not present

## 2019-11-03 DIAGNOSIS — D473 Essential (hemorrhagic) thrombocythemia: Secondary | ICD-10-CM | POA: Diagnosis not present

## 2019-11-04 ENCOUNTER — Other Ambulatory Visit: Payer: Self-pay | Admitting: Urology

## 2019-11-18 ENCOUNTER — Other Ambulatory Visit: Payer: Self-pay

## 2019-11-18 ENCOUNTER — Encounter (HOSPITAL_BASED_OUTPATIENT_CLINIC_OR_DEPARTMENT_OTHER): Payer: Self-pay | Admitting: Urology

## 2019-11-18 NOTE — Progress Notes (Signed)
Spoke w/ via phone for pre-op interview---[facility] needs dos----  I stat 8            Lab results------ COVID test ------11-19-2019 Arrive at -------630 am 11-23-2019 NPO after ------midnight Medications to take morning of surgery -----flecanide, carvedilol, diltiazem, eliquis per dr Tresa Moore Diabetic medication -----n/a Patient Special Instructions ----- Pre-Op special Istructions ----- Patient verbalized understanding of instructions that were given at this phone interview. Patient denies shortness of breath, chest pain, fever, cough a this phone interview.  Anesthesia : cad, blood thinner  [id number] holt Cardiologist :dr Jacqualin Combes 08-25-2019 chart/epic Chest x-ray :01-06-2019 chart/epic EKG :04-12-2019 chart/epic Echo :06-23-2017 chart/epic Cardiac Cath :  Sleep Study/ CPAP :none Fasting Blood Sugar :      / Checks Blood Sugar -- times a day:  n/a Blood Thinner/ Instructions /Last Dose: patient staying on eliquis per dr Tresa Moore note on chart dated 11-04-2019 ASA / Instructions/ Last Dose : n/a  Patient denies shortness of breath, chest pain, fever, and cough at this phone interview.

## 2019-11-18 NOTE — Progress Notes (Signed)
Anesthesia Chart Review   Case: [id number] Date/Time: 11/23/19 0815   Procedure: CYSTOSCOPY WITH RETROGRADE PYELOGRAM/URETERAL STENT PLACEMENT (Right ) - 55 MINS   Anesthesia type: General   Pre-op diagnosis: RIGHT URETERAL STRICTURE   Location: [facility] OR ROOM 2 / [place]   Surgeons: Alexis Frock, MD      DISCUSSION:78 y.o. never smoker with h/o GERD, HLD, HTN, PAF (on Eliquis), CAD, chronic diastolic heart failure, CKD, cystocele w/pessary, right ureteral stricture scheduled for above procedure 11/23/19 with Dr. Alexis Frock.   Pt last seen by cardiologist, Dr. Jenne Campus, 08/25/2019.  Per OV note, diastolic heart failure compensated, CAD stable, denies palpitation, carvedilol discontinued due to low blood pressure.    Pt advised to continue Eliquis by Dr. Tresa Moore.   S/p cystoscopy 06/03/2019 with no anesthesia complications noted, no mention of difficult airway.   VS: Ht 5\' 7"  (1.702 m)   Wt 79.4 kg   BMI 27.41 kg/m   PROVIDERS: [name], MD is PCP   Jenne Campus, MD is Cardiologist  LABS: labs DOS (all labs ordered are listed, but only abnormal results are displayed)  Labs Reviewed - No data to display   IMAGES:   EKG: 04/11/2019 78 bpm Sinus rhythm with 1st degree AV block  Cannot rule out anterior infarct, age undetermined   CV: Echo 07/24/2017 Conclusions 1. There is normal global left ventricular contractility 2. Overall left ventricular systolic function is normal with, an EF between 60-65% 3. Trace tricuspid regurgitation present Past Medical History:  Diagnosis Date  . Abdominal aortic atherosclerosis ([place]) 05/07/2016   Noted on CT abd/pelvis  . Anticoagulant long-term use    ELIQUIS  . Chronic diastolic (congestive) heart failure (HCC)    hx of  . Chronic kidney disease 2014   arf on dialysis in [place]  . Coronary artery disease   . Cystocele, midline    followed by dr c. Marvel [facility] in [place])   pt uses pessary  . Diverticulosis 05/07/2016   Noted on CT abd/pelvis  . First degree heart block   . General weakness   . GERD (gastroesophageal reflux disease)   . Hematuria    intermittently due to ureter right stent  . Hiatal hernia   . History of arteriovenostomy for renal dialysis ([place])   . History of colon polyps   . History of gastric polyp   . History of kidney infection    12-31-2018  perinephrtic abscess  right side  s/p drains x2 01-10-2019,  05/ 2020 drains removed  . History of kidney stones   . History of peptic ulcer disease   . History of septic shock    2011 (pt unaware) and 06-04-2013  w/ acute renal failure  . History of uterine cancer    STAGE I  --  S/P TAH W/ BSO  (NO OTHER TX)  . History of ventilator dependency ([place]) 2014   in setting of septic shock  . Hyperlipidemia   . Hypertension   . Iron deficiency anemia hematology/ oncology--- dr Bobby Rumpf at [facility] in [place]--- per lov note 08-07-2017  stabilized and felt to be more chronic anemia   01/ 2018  dx iron def. anemia  s/p  IV Iron infusion and taking oral iron supplement  . Lumbar compression fracture ([place])    01-22-2019 per imaging L1  (06-01-2019 per pt currently no pain)  . PAF (paroxysmal atrial fibrillation) ([place]) DX JUNE 2012   CARDIOLOGIST--  DR Vernie Shanks--- [place] cardiology)  CHADS2  . Presence of pessary   . Right wrist fracture    per pt fractured on 03-10-2019,  had closed reduction and wearing a brace  . Ureteral stricture, right urologist-- dr Tresa Moore   Chronic--- treated with ureteral stent  . Uses walker    and wheelchair for longer distance  . Wears glasses     Past Surgical History:  Procedure Laterality Date  . CARDIOVERSION  09/ 2018   dr Agustin Cree   successful (NSR )  . CHOLECYSTECTOMY  2010  . COLONOSCOPY W/ POLYPECTOMY  05/2017  . CYSTO/  BALLOON DILATION RIGHT URETERAL STRICTURE/ STENT PLACEMENT  06-02-2013  . CYSTO/  BILATERAL  RETROGRADE PYELOGRAM/ RIGHT URETEROSCOPY AND STENT PLACEMENT  10-04-2005  . CYSTO/ LEFT URETEROSCOPIC STONE EXTRACTION  04-03-2006  . CYSTOSCOPY W/ RETROGRADES Right 01/03/2019   Procedure: CYSTOSCOPY WITH RETROGRADE PYELOGRAM RIGHT WITH STENT EXCHANGE;  Surgeon: Irine Seal, MD;  [facility];  Service: Urology;  Laterality: Right;  . CYSTOSCOPY W/ URETERAL STENT PLACEMENT Right 02/06/2014   Procedure: CYSTOSCOPY WITH RIGHT RETROGRADE PYELOGRAM RIGHT STENT REMOVAL and REPLACEMENT, Right Ureteroscopy;  Surgeon: Ailene Rud, MD;  Location: [facility];  Service: Urology;  Laterality: Right;  . CYSTOSCOPY W/ URETERAL STENT PLACEMENT Right 11/26/2016   Procedure: CYSTOSCOPY WITH RETROGRADE PYELOGRAM/URETERAL STENT REPLACEMENT;  Surgeon: Alexis Frock, MD;  Location: [facility];  Service: Urology;  Laterality: Right;  . CYSTOSCOPY W/ URETERAL STENT PLACEMENT Right 04/24/2017   Procedure: CYSTOSCOPY WITH RETROGRADE PYELOGRAM/URETERAL STENT REPLACEMENT;  Surgeon: Alexis Frock, MD;  Location: [facility];  Service: Urology;  Laterality: Right;  . CYSTOSCOPY W/ URETERAL STENT PLACEMENT Right 10/07/2017   Procedure: CYSTOSCOPY WITH RETROGRADE PYELOGRAM/URETERAL STENT EXCHANGE;  Surgeon: Alexis Frock, MD;  Location: [facility];  Service: Urology;  Laterality: Right;  . CYSTOSCOPY W/ URETERAL STENT PLACEMENT Right 03/03/2018   Procedure: CYSTOSCOPY WITH RIGHT RETROGRADE / RIGHT URETERAL STENT EXCHANGE;  Surgeon: Alexis Frock, MD;  [facility];  Service: Urology;  Laterality: Right;  . CYSTOSCOPY W/ URETERAL STENT PLACEMENT Right 09/22/2018   Procedure: CYSTOSCOPY WITH RETROGRADE PYELOGRAM/URETERAL STENT PLACEMENT;  Surgeon: Alexis Frock, MD;  Location: [facility];  Service: Urology;  Laterality: Right;  45 MINS  . CYSTOSCOPY W/ URETERAL STENT PLACEMENT Right 06/03/2019   Procedure: CYSTOSCOPY WITH RETROGRADE  PYELOGRAM/URETERAL STENT REPLACEMENT;  Surgeon: Alexis Frock, MD;  Location: [facility];  Service: Urology;  Laterality: Right;  . CYSTOSCOPY WITH LITHOLAPAXY N/A 11/21/2013   Procedure: CYSTOSCOPY WITH LITHOLAPAXY;  Surgeon: Ailene Rud, MD;  Location: [facility];  Service: Urology;  Laterality: N/A;  . CYSTOSCOPY WITH RETROGRADE PYELOGRAM, URETEROSCOPY AND STENT PLACEMENT Bilateral 03/15/2014   Procedure: CYSTOSCOPY WITH BILATERAL RETROGRADE PYELOGRAM, RIGHT DIAGNOSTIC URETEROSCOPY AND STENT EXCHANGE;  Surgeon: Alexis Frock, MD;  [facility];  Service: Urology;  Laterality: Bilateral;  . CYSTOSCOPY WITH STENT PLACEMENT Right 05/07/2016   Procedure: CYSTOSCOPY WITH RETROGRADE PYELOGRAM, URETERAL STENT PLACEMENT;  Surgeon: Franchot Gallo, MD;  [facility];  Service: Urology;  Laterality: Right;  . HEMIARTHROPLASTY HIP Right 11/2016   fractured  . IR CATHETER TUBE CHANGE  02/04/2019  . IR CATHETER TUBE CHANGE  02/04/2019  . IR CATHETER TUBE CHANGE  02/11/2019  . IR RADIOLOGIST EVAL & MGMT  03/09/2019  . JOINT REPLACEMENT    . LUMBAR FUSION  JULY 2013  . NEPHROLITHOTOMY  X2 YRS AGO  .  PERCUTANEOUS NEPHROSTOMY  12/2018  . TOTAL ABDOMINAL HYSTERECTOMY W/ BILATERAL SALPINGOOPHORECTOMY  1989  . TOTAL KNEE ARTHROPLASTY Bilateral 1992  &  1996  . TRANSTHORACIC ECHOCARDIOGRAM  07-24-2017   dr Agustin Cree   ef 60-65% (improved from last echo 2014, was 45-50%)/  trace TR/  mild AV sclerosis without stenosis  . URETEROSCOPY Right 11/21/2013   Procedure: URETEROSCOPY WITH STENT REMOVAL AND REPLACEMENT;  Surgeon: Ailene Rud, MD;  Location: [facility];  Service: Urology;  Laterality: Right;    MEDICATIONS: No current facility-administered medications for this encounter.   . hydroxyurea (HYDREA) 500 MG capsule  . acetaminophen (TYLENOL) 500 MG tablet  . amitriptyline (ELAVIL) 25 MG tablet  . carvedilol (COREG) 6.25 MG tablet   . diltiazem (CARDIZEM CD) 120 MG 24 hr capsule  . ELIQUIS 5 MG TABS tablet  . flecainide (TAMBOCOR) 50 MG tablet  . Meth-Hyo-M Bl-Na Phos-Ph Sal (URO-MP PO)  . omeprazole (PRILOSEC) 40 MG capsule  . ondansetron (ZOFRAN-ODT) 4 MG disintegrating tablet     [facility] Pre-Surgical Testing [phone] 11/18/19  3:24 PM

## 2019-11-19 ENCOUNTER — Other Ambulatory Visit (HOSPITAL_COMMUNITY)
Admission: RE | Admit: 2019-11-19 | Discharge: 2019-11-19 | Disposition: A | Discharge status: Home / Self Care | Payer: Medicare Other | Source: Ambulatory Visit | Attending: Urology | Admitting: Urology

## 2019-11-19 DIAGNOSIS — Z20822 Contact with and (suspected) exposure to covid-19: Secondary | ICD-10-CM | POA: Insufficient documentation

## 2019-11-19 DIAGNOSIS — Z01812 Encounter for preprocedural laboratory examination: Secondary | ICD-10-CM | POA: Diagnosis not present

## 2019-11-19 LAB — SARS CORONAVIRUS 2 (TAT 6-24 HRS): SARS Coronavirus 2: NEGATIVE

## 2019-11-21 ENCOUNTER — Other Ambulatory Visit: Payer: Self-pay | Admitting: Cardiology

## 2019-11-23 ENCOUNTER — Encounter (HOSPITAL_BASED_OUTPATIENT_CLINIC_OR_DEPARTMENT_OTHER)
Admission: RE | Disposition: A | Discharge status: Home / Self Care | Payer: Self-pay | Source: Home / Self Care | Attending: Urology

## 2019-11-23 ENCOUNTER — Ambulatory Visit (HOSPITAL_BASED_OUTPATIENT_CLINIC_OR_DEPARTMENT_OTHER): Payer: Medicare Other | Admitting: Physician Assistant

## 2019-11-23 ENCOUNTER — Ambulatory Visit (HOSPITAL_BASED_OUTPATIENT_CLINIC_OR_DEPARTMENT_OTHER)
Admission: RE | Admit: 2019-11-23 | Discharge: 2019-11-23 | Disposition: A | Discharge status: Home / Self Care | Payer: Medicare Other | Attending: Urology | Admitting: Urology

## 2019-11-23 ENCOUNTER — Encounter (HOSPITAL_BASED_OUTPATIENT_CLINIC_OR_DEPARTMENT_OTHER): Payer: Self-pay | Admitting: Urology

## 2019-11-23 ENCOUNTER — Other Ambulatory Visit: Payer: Self-pay

## 2019-11-23 DIAGNOSIS — Z466 Encounter for fitting and adjustment of urinary device: Secondary | ICD-10-CM | POA: Insufficient documentation

## 2019-11-23 DIAGNOSIS — Z992 Dependence on renal dialysis: Secondary | ICD-10-CM | POA: Insufficient documentation

## 2019-11-23 DIAGNOSIS — Z8542 Personal history of malignant neoplasm of other parts of uterus: Secondary | ICD-10-CM | POA: Insufficient documentation

## 2019-11-23 DIAGNOSIS — Z96653 Presence of artificial knee joint, bilateral: Secondary | ICD-10-CM | POA: Insufficient documentation

## 2019-11-23 DIAGNOSIS — N131 Hydronephrosis with ureteral stricture, not elsewhere classified: Secondary | ICD-10-CM | POA: Diagnosis not present

## 2019-11-23 DIAGNOSIS — I480 Paroxysmal atrial fibrillation: Secondary | ICD-10-CM | POA: Diagnosis not present

## 2019-11-23 DIAGNOSIS — I700 Atherosclerosis of aorta: Secondary | ICD-10-CM | POA: Insufficient documentation

## 2019-11-23 DIAGNOSIS — I2510 Atherosclerotic heart disease of native coronary artery without angina pectoris: Secondary | ICD-10-CM | POA: Insufficient documentation

## 2019-11-23 DIAGNOSIS — K219 Gastro-esophageal reflux disease without esophagitis: Secondary | ICD-10-CM | POA: Diagnosis not present

## 2019-11-23 DIAGNOSIS — Z79899 Other long term (current) drug therapy: Secondary | ICD-10-CM | POA: Diagnosis not present

## 2019-11-23 DIAGNOSIS — I252 Old myocardial infarction: Secondary | ICD-10-CM | POA: Diagnosis not present

## 2019-11-23 DIAGNOSIS — N189 Chronic kidney disease, unspecified: Secondary | ICD-10-CM | POA: Diagnosis not present

## 2019-11-23 DIAGNOSIS — N135 Crossing vessel and stricture of ureter without hydronephrosis: Secondary | ICD-10-CM | POA: Diagnosis not present

## 2019-11-23 DIAGNOSIS — E785 Hyperlipidemia, unspecified: Secondary | ICD-10-CM | POA: Diagnosis not present

## 2019-11-23 DIAGNOSIS — I130 Hypertensive heart and chronic kidney disease with heart failure and stage 1 through stage 4 chronic kidney disease, or unspecified chronic kidney disease: Secondary | ICD-10-CM | POA: Diagnosis not present

## 2019-11-23 DIAGNOSIS — I5032 Chronic diastolic (congestive) heart failure: Secondary | ICD-10-CM | POA: Insufficient documentation

## 2019-11-23 DIAGNOSIS — Z7901 Long term (current) use of anticoagulants: Secondary | ICD-10-CM | POA: Insufficient documentation

## 2019-11-23 DIAGNOSIS — N136 Pyonephrosis: Secondary | ICD-10-CM | POA: Diagnosis not present

## 2019-11-23 HISTORY — PX: CYSTOSCOPY W/ URETERAL STENT PLACEMENT: SHX1429

## 2019-11-23 LAB — POCT I-STAT, CHEM 8
BUN: 18 mg/dL (ref 8–23)
Calcium, Ion: 1.28 mmol/L (ref 1.15–1.40)
Chloride: 104 mmol/L (ref 98–111)
Creatinine, Ser: 1 mg/dL (ref 0.44–1.00)
Glucose, Bld: 98 mg/dL (ref 70–99)
HCT: 46 % (ref 36.0–46.0)
Hemoglobin: 15.6 g/dL — ABNORMAL HIGH (ref 12.0–15.0)
Potassium: 4.1 mmol/L (ref 3.5–5.1)
Sodium: 141 mmol/L (ref 135–145)
TCO2: 25 mmol/L (ref 22–32)

## 2019-11-23 SURGERY — CYSTOSCOPY, WITH RETROGRADE PYELOGRAM AND URETERAL STENT INSERTION
Anesthesia: General | Site: Ureter | Laterality: Right

## 2019-11-23 MED ORDER — GENTAMICIN SULFATE 40 MG/ML IJ SOLN
5.0000 mg/kg | INTRAVENOUS | Status: DC
  Filled 2019-11-23: qty 9.5

## 2019-11-23 MED ORDER — ONDANSETRON HCL 4 MG/2ML IJ SOLN
INTRAMUSCULAR | Status: DC | PRN
  Administered 2019-11-23: 4 mg via INTRAVENOUS

## 2019-11-23 MED ORDER — IOHEXOL 300 MG/ML  SOLN
INTRAMUSCULAR | Status: DC | PRN
  Administered 2019-11-23: 10 mL via URETHRAL

## 2019-11-23 MED ORDER — LIDOCAINE 2% (20 MG/ML) 5 ML SYRINGE
INTRAMUSCULAR | Status: DC | PRN
  Administered 2019-11-23: 60 mg via INTRAVENOUS

## 2019-11-23 MED ORDER — SODIUM CHLORIDE 0.9 % IV SOLN
INTRAVENOUS | Status: DC
  Filled 2019-11-23: qty 1000

## 2019-11-23 MED ORDER — FENTANYL CITRATE (PF) 100 MCG/2ML IJ SOLN
INTRAMUSCULAR | Status: AC
  Filled 2019-11-23: qty 2

## 2019-11-23 MED ORDER — GENTAMICIN SULFATE 40 MG/ML IJ SOLN
5.0000 mg/kg | INTRAVENOUS | Status: AC
  Administered 2019-11-23: 346 mg via INTRAVENOUS
  Filled 2019-11-23: qty 8.75

## 2019-11-23 MED ORDER — LIDOCAINE 2% (20 MG/ML) 5 ML SYRINGE
INTRAMUSCULAR | Status: AC
  Filled 2019-11-23: qty 5

## 2019-11-23 MED ORDER — PROPOFOL 10 MG/ML IV BOLUS
INTRAVENOUS | Status: DC | PRN
  Administered 2019-11-23: 140 ug via INTRAVENOUS

## 2019-11-23 MED ORDER — PROPOFOL 10 MG/ML IV BOLUS
INTRAVENOUS | Status: AC
  Filled 2019-11-23: qty 20

## 2019-11-23 SURGICAL SUPPLY — 22 items
BAG DRAIN URO-CYSTO SKYTR STRL (DRAIN) ×2 IMPLANT
BASKET LASER NITINOL 1.9FR (BASKET) IMPLANT
CATH INTERMIT  6FR 70CM (CATHETERS) IMPLANT
CLOTH BEACON ORANGE TIMEOUT ST (SAFETY) ×2 IMPLANT
FIBER LASER FLEXIVA 365 (UROLOGICAL SUPPLIES) IMPLANT
FIBER LASER TRAC TIP (UROLOGICAL SUPPLIES) IMPLANT
GLOVE BIO SURGEON STRL SZ7.5 (GLOVE) ×2 IMPLANT
GOWN STRL REUS W/TWL LRG LVL3 (GOWN DISPOSABLE) ×2 IMPLANT
GUIDEWIRE ANG ZIPWIRE 038X150 (WIRE) ×2 IMPLANT
GUIDEWIRE STR DUAL SENSOR (WIRE) ×2 IMPLANT
IV NS 1000ML (IV SOLUTION) ×1
IV NS 1000ML BAXH (IV SOLUTION) ×1 IMPLANT
IV NS IRRIG 3000ML ARTHROMATIC (IV SOLUTION) ×2 IMPLANT
KIT TURNOVER CYSTO (KITS) ×2 IMPLANT
MANIFOLD NEPTUNE II (INSTRUMENTS) ×2 IMPLANT
NS IRRIG 500ML POUR BTL (IV SOLUTION) ×2 IMPLANT
PACK CYSTO (CUSTOM PROCEDURE TRAY) ×2 IMPLANT
STENT POLARIS LOOP 8FR X 24 CM (STENTS) ×2 IMPLANT
SYR 10ML LL (SYRINGE) ×2 IMPLANT
TUBE CONNECTING 12X1/4 (SUCTIONS) ×2 IMPLANT
TUBE FEEDING 8FR 16IN STR KANG (MISCELLANEOUS) IMPLANT
TUBING UROLOGY SET (TUBING) ×2 IMPLANT

## 2019-11-23 NOTE — Anesthesia Preprocedure Evaluation (Signed)
Anesthesia Evaluation  Patient identified by MRN, date of birth, ID band Patient awake    Reviewed: Allergy & Precautions, NPO status , Patient's Chart, lab work & pertinent test results, reviewed documented beta blocker date and time   Airway Mallampati: II  TM Distance: >3 FB Neck ROM: Full    Dental  (+) Teeth Intact, Dental Advisory Given   Pulmonary neg pulmonary ROS,    Pulmonary exam normal breath sounds clear to auscultation       Cardiovascular hypertension, Pt. on home beta blockers and Pt. on medications + CAD, + Past MI, +CHF and + DOE  Normal cardiovascular exam+ dysrhythmias (1st degree heart block) Atrial Fibrillation  Rhythm:Regular Rate:Normal     Neuro/Psych negative neurological ROS  negative psych ROS   GI/Hepatic Neg liver ROS, hiatal hernia, GERD  Medicated,  Endo/Other  negative endocrine ROS  Renal/GU Renal disease (RIGHT URETERAL STRICTURE; History of arteriovenostomy for renal dialysis)     Musculoskeletal negative musculoskeletal ROS (+)   Abdominal   Peds  Hematology  (+) Blood dyscrasia (Eliquis), ,   Anesthesia Other Findings Day of surgery medications reviewed with the patient.  Reproductive/Obstetrics                             Anesthesia Physical  Anesthesia Plan  ASA: III  Anesthesia Plan: General   Post-op Pain Management:    Induction: Intravenous  PONV Risk Score and Plan: 3 and Dexamethasone, Ondansetron, Treatment may vary due to age or medical condition and Midazolam  Airway Management Planned: LMA  Additional Equipment:   Intra-op Plan:   Post-operative Plan: Extubation in OR  Informed Consent: I have reviewed the patients History and Physical, chart, labs and discussed the procedure including the risks, benefits and alternatives for the proposed anesthesia with the patient or authorized representative who has indicated his/her  understanding and acceptance.     Dental advisory given  Plan Discussed with: CRNA  Anesthesia Plan Comments: (See PAT note 06/02/2019, Konrad Felix, PA-C)        Anesthesia Quick Evaluation

## 2019-11-23 NOTE — Op Note (Signed)
NAME: [name] [name], [name] [name] RECORD AS:[id number] ACCOUNT [id number] DATE OF BIRTH:06-11-1941 FACILITY: [facility] LOCATION: [facility] PHYSICIAN:Nomi Rudnicki, MD  OPERATIVE REPORT  DATE OF PROCEDURE:  11/23/2019  PREOPERATIVE DIAGNOSES:  Chronic right ureteral stricture, partial atrophic right kidney, recurrent infections.  PROCEDURE: 1.  Cystoscopy with right retrograde pyelogram with interpretation. 2.  Exchange of right ureteral stent 8 x 24 Polaris, no tether.  ESTIMATED BLOOD LOSS:  Nil.  COMPLICATIONS:  None.  SPECIMEN:  Right ureteral stent for discard.  FINDINGS: 1.  Hydronephrosis. 2.  Right mid ureteral stricture. 3.  Successful placement of right ureteral stent proximal end renal pelvis, distal end urinary bladder.  INDICATIONS:  The patient is a very pleasant 78 year old lady who unfortunately has a history of chronic right mid ureteral stricture that is quite long in length.  She is not a reconstruction candidate.  She is managed with chronic stenting changed  approximately every 4 months and has done moderately well with this.  We are discussing possible change of plan towards a right nephrectomy at some point in the future.  In the meantime, she is continuing her right stent changes which she had done last  approximately 4 months ago.  She is due for exchange.  She presents for this today.  Informed consent was then placed and obtained in medical record.  DESCRIPTION OF PROCEDURE:  The patient being the patient, procedure being right ureteral stent exchange was confirmed.  Procedure timeout was performed.  Anesthesia of general LMA anesthesia induced.  The patient was placed into a low lithotomy position.   Sterile field was created prepped and draped the patient's vagina, introitus and proximal thighs using iodine.  Cystourethroscopy was performed with a 21-French rigid cystoscope vessel lens.  Inspection of bladder revealed the distal end of right  ureteral  stent in situ.  There was moderate encrustation.  It clearly was due for exchange.  It was grasped and brought out in its entirety, set aside for discard.  It was inspected and intact and the right ureteral orifice was cannulated with a 6 [place]  renal catheter and right retrograde pyelogram was obtained.  Right retrograde pyelogram does show a single right ureter single system right kidney.  There was a high-grade mid ureteral stricture that is stable and significant hydronephrosis.  A 0.03 ZIPwire was advanced to lower pole and a new 8 x 24 Polaris-type  stent was placed using cystoscopic and fluoroscopic guidance.  Good proximal and distal plane were noted.  The bladder was entered per cystoscope.  Procedure terminated.  The patient tolerated the procedure well.  No immediate complications.  The patient  was taken to postanesthesia care in stable condition.  Plan for discharge home.  CN/NUANCE  D:11/23/2019 T:11/23/2019 JOB:[id number]

## 2019-11-23 NOTE — Anesthesia Postprocedure Evaluation (Signed)
Anesthesia Post Note  Patient: [name] [name]  Procedure(s) Performed: CYSTOSCOPY WITH RETROGRADE PYELOGRAM/URETERAL STENT PLACEMENT (Right Ureter)     Patient location during evaluation: PACU Anesthesia Type: General Level of consciousness: awake and alert Pain management: pain level controlled Vital Signs Assessment: post-procedure vital signs reviewed and stable Respiratory status: spontaneous breathing, nonlabored ventilation and respiratory function stable Cardiovascular status: blood pressure returned to baseline and stable Postop Assessment: no apparent nausea or vomiting Anesthetic complications: no    Last Vitals:  Vitals:   11/23/19 0930 11/23/19 0945  BP: 129/68 121/66  Pulse: 69 (!) 58  Resp: 17 16  Temp:  36.7 C  SpO2: 93% 99%    Last Pain:  Vitals:   11/23/19 0945  TempSrc:   PainSc: 0-No pain                 Lynda Rainwater

## 2019-11-23 NOTE — Brief Op Note (Signed)
11/23/2019  8:47 AM  PATIENT:  Ballard Russell Hadden  79 y.o. female  PRE-OPERATIVE DIAGNOSIS:  RIGHT URETERAL STRICTURE  POST-OPERATIVE DIAGNOSIS:  RIGHT URETERAL STRICTURE  PROCEDURE:  Procedure(s) with comments: CYSTOSCOPY WITH RETROGRADE PYELOGRAM/URETERAL STENT PLACEMENT (Right) - 45 MINS  SURGEON:  Surgeon(s) and Role:    * Alexis Frock, MD - Primary  PHYSICIAN ASSISTANT:   ASSISTANTS: none   ANESTHESIA:   general  EBL:  minmial   BLOOD ADMINISTERED:none  DRAINS: none   LOCAL MEDICATIONS USED:  NONE  SPECIMEN:  Source of Specimen:  Rt ureteral stent  DISPOSITION OF SPECIMEN:  discard  COUNTS:  YES  TOURNIQUET:  * No tourniquets in log *  DICTATION: .Other Dictation: Dictation Number [id number]  PLAN OF CARE: Discharge to home after PACU  PATIENT DISPOSITION:  PACU - hemodynamically stable.   Delay start of Pharmacological VTE agent (>24hrs) due to surgical blood loss or risk of bleeding: yes

## 2019-11-23 NOTE — Discharge Instructions (Signed)
1 - You may have urinary urgency (bladder spasms) and bloody urine on / off with stent in place. This is normal.  2 - Call MD or go to ER for fever >102, severe pain / nausea / vomiting not relieved by medications, or acute change in medical status   Post Anesthesia Home Care Instructions  Activity: Get plenty of rest for the remainder of the day. A responsible adult should stay with you for 24 hours following the procedure.  For the next 24 hours, DO NOT: -Drive a car -Paediatric nurse -Drink alcoholic beverages -Take any medication unless instructed by your physician -Make any legal decisions or sign important papers.  Meals: Start with liquid foods such as gelatin or soup. Progress to regular foods as tolerated. Avoid greasy, spicy, heavy foods. If nausea and/or vomiting occur, drink only clear liquids until the nausea and/or vomiting subsides. Call your physician if vomiting continues.  Special Instructions/Symptoms: Your throat may feel dry or sore from the anesthesia or the breathing tube placed in your throat during surgery. If this causes discomfort, gargle with warm salt water. The discomfort should disappear within 24 hours.  If you had a scopolamine patch placed behind your ear for the management of post- operative nausea and/or vomiting:  1. The medication in the patch is effective for 72 hours, after which it should be removed.  Wrap patch in a tissue and discard in the trash. Wash hands thoroughly with soap and water. 2. You may remove the patch earlier than 72 hours if you experience unpleasant side effects which may include dry mouth, dizziness or visual disturbances. 3. Avoid touching the patch. Wash your hands with soap and water after contact with the patch.   CYSTOSCOPY HOME CARE INSTRUCTIONS  Activity: Rest for the remainder of the day.  Do not drive or operate equipment today.  You may resume normal activities in one to two days as instructed by your physician.     Meals: Drink plenty of liquids and eat light foods such as gelatin or soup this evening.  You may return to a normal meal plan tomorrow.  Return to Work: You may return to work in one to two days or as instructed by your physician.  Special Instructions / Symptoms: Call your physician if any of these symptoms occur:   -persistent or heavy bleeding  -bleeding which continues after first few urination  -large blood clots that are difficult to pass  -urine stream diminishes or stops completely  -fever equal to or higher than 101 degrees Farenheit.  -cloudy urine with a strong, foul odor  -severe pain  Females should always wipe from front to back after elimination.  You may feel some burning pain when you urinate.  This should disappear with time.  Applying moist heat to the lower abdomen or a hot tub bath may help relieve the pain.

## 2019-11-23 NOTE — H&P (Signed)
[name] [name] is an 79 y.o. female.    Chief Complaint: Pre-Op RIGHT Ureteral Stent Exchange  HPI:   1 - Rt Chronic Hydronephrosis / Partial Ureteral Stricture- Multiple prior stone procedures on right including SWL, ureteroscopy, and neph tube. Concern for high grade distal stricure 05/2013 during episode of urosepsis treated with neph tube by antegrade nephrostogram. Formal diagnostic ureteroscopy 02/2014 with several areas of relative narrowing (distal and proximal) but NO frank stricture (accomodated 30F sheath to UPJ). 1 artery / 2 vein (lower small acessory) rt renovascular anatomy. Renogram 09/2019 with Rt 42% / Lt 58% relative function.    Recent Course:   05/2016 - Renogram Rt 42% / Lt 58% relative function  10/2016 - Exchange Rt stent 7x24 polaris; 04/2017 Exchange Rt stent 7x24 polaris; 09/2017 Exchange Rt stent 7x24 plaris  02/2018 - Exchange Rt 8x24 polaris stent; 08/2018 Exchange Rt 8x24 Polaris; 12/2018 Exchange Rt 8x24 Polaris. 05/2019 Exchange Rt 8x24 Polaris    PMH sig for AFib / Thrombocytosis / Eliquus (primary prevention only, cardioversion 2018, follows Dr [facility] in [place]) , lap chole, hyst, left open ureterolithotomy, ureteroscopy x several, shockwave lithotripsy.    Today [name] [name] is seen to proceed with RIGHT ureteral stent change. C19 screen negative.     Past Medical History:  Diagnosis Date  . Abdominal aortic atherosclerosis ([place]) 05/07/2016   Noted on CT abd/pelvis  . Anticoagulant long-term use    ELIQUIS  . Chronic diastolic (congestive) heart failure (HCC)    hx of  . Chronic kidney disease 2014   arf on dialysis in icu  . Coronary artery disease   . Cystocele, midline    followed by dr c. Marvel [facility] in [place])  pt uses pessary  . Diverticulosis 05/07/2016   Noted on CT abd/pelvis  . First degree heart block   . General weakness   . GERD (gastroesophageal reflux disease)   . Hematuria     intermittently due to ureter right stent  . Hiatal hernia   . History of arteriovenostomy for renal dialysis ([place])   . History of colon polyps   . History of gastric polyp   . History of kidney infection    12-31-2018  perinephrtic abscess  right side  s/p drains x2 01-10-2019,  05/ 2020 drains removed  . History of kidney stones   . History of peptic ulcer disease   . History of septic shock    2011 (pt unaware) and 06-04-2013  w/ acute renal failure  . History of uterine cancer    STAGE I  --  S/P TAH W/ BSO  (NO OTHER TX)  . History of ventilator dependency ([place]) 2014   in setting of septic shock  . Hyperlipidemia   . Hypertension   . Iron deficiency anemia hematology/ oncology--- dr Bobby Rumpf at [facility] in [place]--- per lov note 08-07-2017  stabilized and felt to be more chronic anemia   01/ 2018  dx iron def. anemia  s/p  IV Iron infusion and taking oral iron supplement  . Lumbar compression fracture ([place])    01-22-2019 per imaging L1  (06-01-2019 per pt currently no pain)  . PAF (paroxysmal atrial fibrillation) ([place]) DX JUNE 2012   CARDIOLOGIST--  DR Vernie Shanks--- [facility])  CHADS2  . Presence of pessary   . Right wrist fracture    per pt fractured on 03-10-2019,  had closed reduction and wearing a brace  . Ureteral  stricture, right urologist-- dr Tresa Moore   Chronic--- treated with ureteral stent  . Uses walker    and wheelchair for longer distance  . Wears glasses     Past Surgical History:  Procedure Laterality Date  . CARDIOVERSION  09/ 2018   dr Agustin Cree   successful ([facility] )  . CHOLECYSTECTOMY  2010  . COLONOSCOPY W/ POLYPECTOMY  05/2017  . CYSTO/  BALLOON DILATION RIGHT URETERAL STRICTURE/ STENT PLACEMENT  06-02-2013  . CYSTO/  BILATERAL RETROGRADE PYELOGRAM/ RIGHT URETEROSCOPY AND STENT PLACEMENT  10-04-2005  . CYSTO/ LEFT URETEROSCOPIC STONE EXTRACTION  04-03-2006  . CYSTOSCOPY W/ RETROGRADES Right 01/03/2019   Procedure:  CYSTOSCOPY WITH RETROGRADE PYELOGRAM RIGHT WITH STENT EXCHANGE;  Surgeon: Irine Seal, MD;  Location: [facility];  Service: Urology;  Laterality: Right;  . CYSTOSCOPY W/ URETERAL STENT PLACEMENT Right 02/06/2014   Procedure: CYSTOSCOPY WITH RIGHT RETROGRADE PYELOGRAM RIGHT STENT REMOVAL and REPLACEMENT, Right Ureteroscopy;  Surgeon: Ailene Rud, MD;  Location: [facility];  Service: Urology;  Laterality: Right;  . CYSTOSCOPY W/ URETERAL STENT PLACEMENT Right 11/26/2016   Procedure: CYSTOSCOPY WITH RETROGRADE PYELOGRAM/URETERAL STENT REPLACEMENT;  Surgeon: Alexis Frock, MD;  Location: [facility];  Service: Urology;  Laterality: Right;  . CYSTOSCOPY W/ URETERAL STENT PLACEMENT Right 04/24/2017   Procedure: CYSTOSCOPY WITH RETROGRADE PYELOGRAM/URETERAL STENT REPLACEMENT;  Surgeon: Alexis Frock, MD;  Location: [facility];  Service: Urology;  Laterality: Right;  . CYSTOSCOPY W/ URETERAL STENT PLACEMENT Right 10/07/2017   Procedure: CYSTOSCOPY WITH RETROGRADE PYELOGRAM/URETERAL STENT EXCHANGE;  Surgeon: Alexis Frock, MD;  Location: [facility];  Service: Urology;  Laterality: Right;  . CYSTOSCOPY W/ URETERAL STENT PLACEMENT Right 03/03/2018   Procedure: CYSTOSCOPY WITH RIGHT RETROGRADE / RIGHT URETERAL STENT EXCHANGE;  Surgeon: Alexis Frock, MD;  Location: [facility];  Service: Urology;  Laterality: Right;  . CYSTOSCOPY W/ URETERAL STENT PLACEMENT Right 09/22/2018   Procedure: CYSTOSCOPY WITH RETROGRADE PYELOGRAM/URETERAL STENT PLACEMENT;  Surgeon: Alexis Frock, MD;  Location: [facility];  Service: Urology;  Laterality: Right;  45 MINS  . CYSTOSCOPY W/ URETERAL STENT PLACEMENT Right 06/03/2019   Procedure: CYSTOSCOPY WITH RETROGRADE PYELOGRAM/URETERAL STENT REPLACEMENT;  Surgeon: Alexis Frock, MD;  Location: [facility];  Service: Urology;  Laterality: Right;  . CYSTOSCOPY WITH LITHOLAPAXY N/A 11/21/2013    Procedure: CYSTOSCOPY WITH LITHOLAPAXY;  Surgeon: Ailene Rud, MD;  Location: [facility];  Service: Urology;  Laterality: N/A;  . CYSTOSCOPY WITH RETROGRADE PYELOGRAM, URETEROSCOPY AND STENT PLACEMENT Bilateral 03/15/2014   Procedure: CYSTOSCOPY WITH BILATERAL RETROGRADE PYELOGRAM, RIGHT DIAGNOSTIC URETEROSCOPY AND STENT EXCHANGE;  Surgeon: Alexis Frock, MD;  Location: [facility];  Service: Urology;  Laterality: Bilateral;  . CYSTOSCOPY WITH STENT PLACEMENT Right 05/07/2016   Procedure: CYSTOSCOPY WITH RETROGRADE PYELOGRAM, URETERAL STENT PLACEMENT;  Surgeon: Franchot Gallo, MD;  Location: [facility];  Service: Urology;  Laterality: Right;  . HEMIARTHROPLASTY HIP Right 11/2016   fractured  . IR CATHETER TUBE CHANGE  02/04/2019  . IR CATHETER TUBE CHANGE  02/04/2019  . IR CATHETER TUBE CHANGE  02/11/2019  . IR RADIOLOGIST EVAL & MGMT  03/09/2019  . JOINT REPLACEMENT    . LUMBAR FUSION  JULY 2013  . NEPHROLITHOTOMY  X2 YRS AGO  . PERCUTANEOUS NEPHROSTOMY  12/2018  . TOTAL ABDOMINAL HYSTERECTOMY W/ BILATERAL SALPINGOOPHORECTOMY  1989  . TOTAL KNEE ARTHROPLASTY Bilateral 1992  &  1996  . TRANSTHORACIC ECHOCARDIOGRAM  07-24-2017   dr Edwena Blow  60-65% (improved from last echo 2014, was 45-50%)/  trace TR/  mild AV sclerosis without stenosis  . URETEROSCOPY Right 11/21/2013   Procedure: URETEROSCOPY WITH STENT REMOVAL AND REPLACEMENT;  Surgeon: Ailene Rud, MD;  Location[facility];  Service: Urology;  Laterality: Right;    Family History  Problem Relation Age of Onset  . Heart disease Mother   . Heart failure Mother   . Cancer Mother   . Heart disease Father   . Heart failure Father    Social History:  reports that she has never smoked. She has never used smokeless tobacco. She reports that she does not drink alcohol or use drugs.  Allergies:  Allergies  Allergen Reactions  . Codeine Nausea Only and Other (See Comments)    hallucinations   . Hydrocodone Nausea Only and Other (See Comments)  . Macrodantin [Nitrofurantoin] Nausea And Vomiting    Medications Prior to Admission  Medication Sig Dispense Refill  . acetaminophen (TYLENOL) 500 MG tablet Take 1,000 mg by mouth 2 (two) times daily as needed for moderate pain or headache.    [name] amitriptyline (ELAVIL) 25 MG tablet Take 25 mg by mouth at bedtime.    . carvedilol (COREG) 6.25 MG tablet TAKE 1/2 TABLET BY MOUTH TWICE DAILY 180 tablet 0  . diltiazem (CARDIZEM CD) 120 MG 24 hr capsule Take 1 capsule (120 mg total) by mouth every morning. (Patient taking differently: Take 120 mg by mouth every morning. ) 90 capsule 3  . ELIQUIS 5 MG TABS tablet TAKE ONE TABLET BY MOUTH TWICE DAILY 60 tablet 0  . flecainide (TAMBOCOR) 50 MG tablet Take 1.5 tablets (75 mg total) by mouth 2 (two) times daily. 270 tablet 1  . hydroxyurea (HYDREA) 500 MG capsule Take 500 mg by mouth. May take with food to minimize GI side effects.monday Wednesday friday    . Meth-Hyo-M Bl-Na Phos-Ph Sal (URO-MP PO) Take 1 capsule by mouth 2 (two) times daily.    [name] omeprazole (PRILOSEC) 40 MG capsule Take 40 mg by mouth every morning.     . ondansetron (ZOFRAN-ODT) 4 MG disintegrating tablet Take 4 mg by mouth every 6 (six) hours as needed for nausea/vomiting.      Results for orders placed or performed during the hospital encounter of 11/23/19 (from the past 48 hour(s))  I-STAT, chem 8     Status: Abnormal   Collection Time: 11/23/19  7:11 AM  Result Value Ref Range   Sodium 141 135 - 145 mmol/L   Potassium 4.1 3.5 - 5.1 mmol/L   Chloride 104 98 - 111 mmol/L   BUN 18 8 - 23 mg/dL   Creatinine, Ser 1.00 0.44 - 1.00 mg/dL   Glucose, Bld 98 70 - 99 mg/dL   Calcium, Ion 1.28 1.15 - 1.40 mmol/L   TCO2 25 22 - 32 mmol/L   Hemoglobin 15.6 (H) 12.0 - 15.0 g/dL   HCT 46.0 36.0 - 46.0 %   No results found.  Review of Systems  All other systems reviewed and are negative.   Blood pressure 130/76, temperature 97.8  F (36.6 C), temperature source Oral, resp. rate 16, height 5\' 7"  (1.702 m), weight 80.6 kg, SpO2 98 %. Physical Exam  Constitutional: She appears well-developed.  HENT:  Head: Normocephalic.  Eyes: Pupils are equal, round, and reactive to light.  Cardiovascular: Normal rate.  Respiratory: Effort normal.  GI:  Stable obesity  Genitourinary:    Genitourinary Comments: No CVAT   Musculoskeletal:  General: Normal range of motion.     Cervical back: Normal range of motion.  Neurological: She is alert.  Skin: Skin is warm.  Psychiatric: She has a normal mood and affect.     Assessment/Plan  Proceed as planned with RIGHT ureteral stent change.  Risks, benefits, alternaitves, expectd peri-op course discussed previosly and reiterated today.    Alexis Frock, MD 11/23/2019, 7:37 AM

## 2019-11-23 NOTE — Transfer of Care (Signed)
Immediate Anesthesia Transfer of Care Note  Patient: [name] [name]  Procedure(s) Performed: CYSTOSCOPY WITH RETROGRADE PYELOGRAM/URETERAL STENT PLACEMENT (Right Ureter)  Patient Location: PACU  Anesthesia Type:General  Level of Consciousness: awake, alert  and oriented  Airway & Oxygen Therapy: Patient Spontanous Breathing and Patient connected to nasal cannula oxygen  Post-op Assessment: Report given to RN and Post -op Vital signs reviewed and stable  Post vital signs: Reviewed and stable  Last Vitals:  Vitals Value Taken Time  BP    Temp    Pulse 68 11/23/19 0857  Resp 17 11/23/19 0857  SpO2 99 % 11/23/19 0857  Vitals shown include unvalidated device data.  Last Pain:  Vitals:   11/23/19 9728  TempSrc: Oral         Complications: No apparent anesthesia complications

## 2019-11-23 NOTE — Anesthesia Procedure Notes (Signed)
Procedure Name: LMA Insertion Date/Time: 11/23/2019 8:32 AM Performed by: [place] Juliann Olesky C, CRNA Pre-anesthesia Checklist: Patient identified, Emergency Drugs available, Suction available and Patient being monitored Patient Re-evaluated:Patient Re-evaluated prior to induction Oxygen Delivery Method: Circle system utilized Preoxygenation: Pre-oxygenation with 100% oxygen Induction Type: IV induction Ventilation: Mask ventilation without difficulty LMA: LMA inserted LMA Size: 4.0 Number of attempts: 1 Airway Equipment and Method: Bite block Placement Confirmation: positive ETCO2 Tube secured with: Tape Dental Injury: Teeth and Oropharynx as per pre-operative assessment

## 2019-11-26 IMAGING — CR LUMBAR SPINE - COMPLETE 4+ VIEW
5 series · 5 of 5 positions shown · non-contrast
Comparison: Prior CT from 01/09/2019.

CLINICAL DATA: Initial evaluation for acute back pain for 3-4 days

EXAM:
LUMBAR SPINE - COMPLETE 4+ VIEW

[t lumbar spine ap]
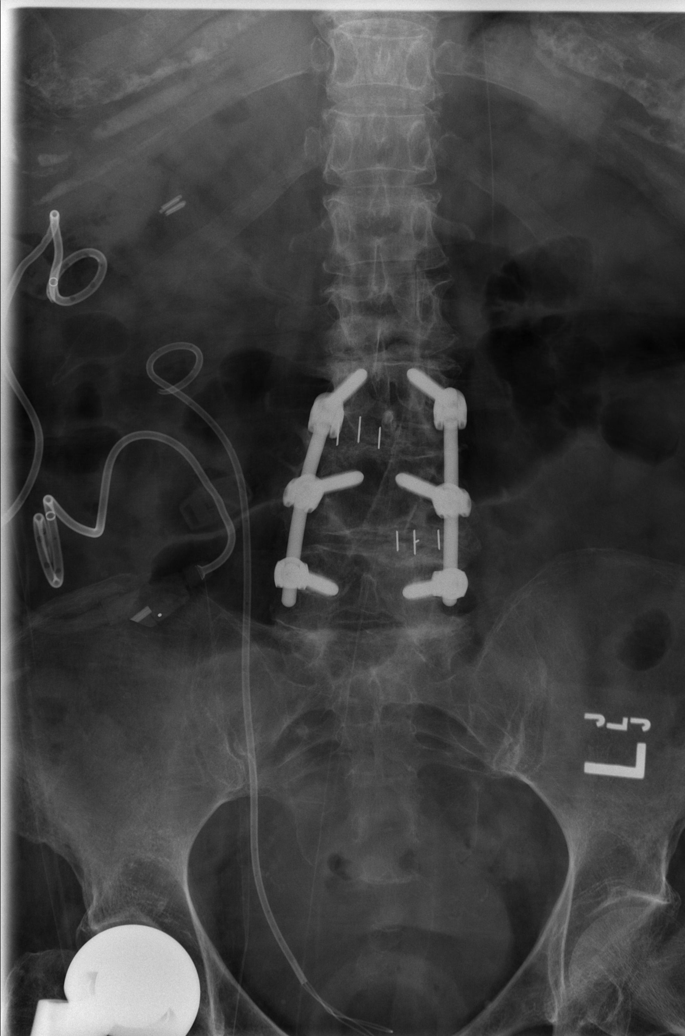

[t lumbar spine obl (1 of 2)]
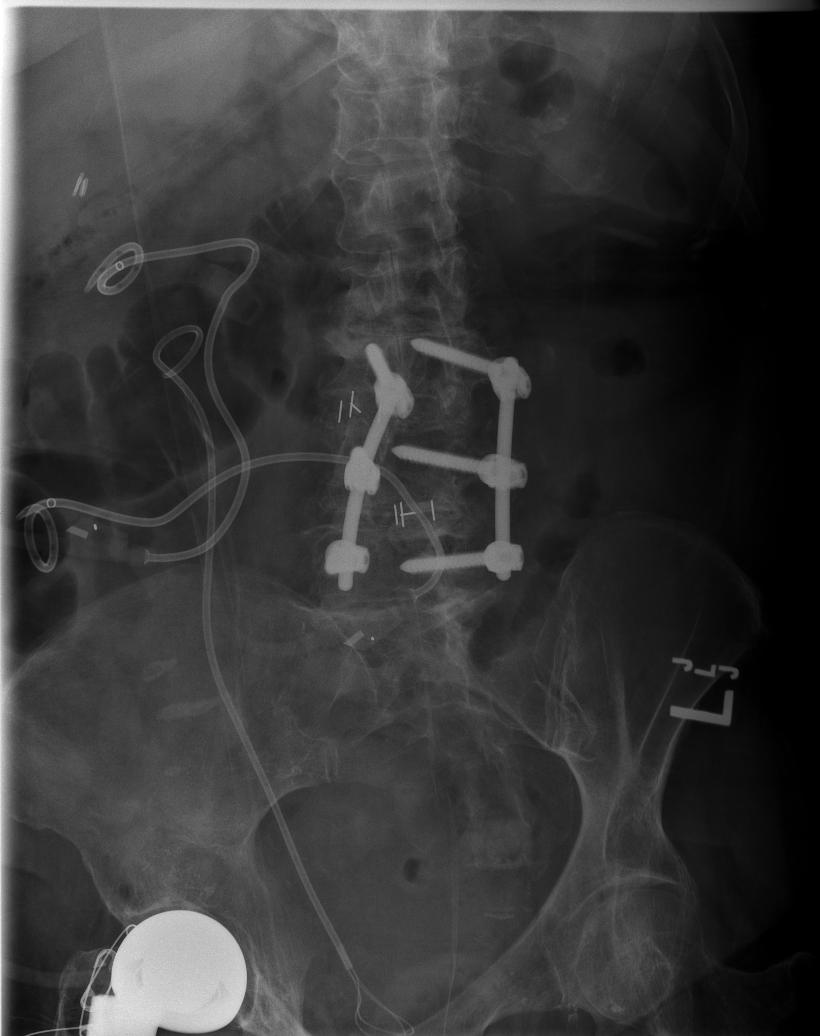

[t lumbar spine obl (2 of 2)]
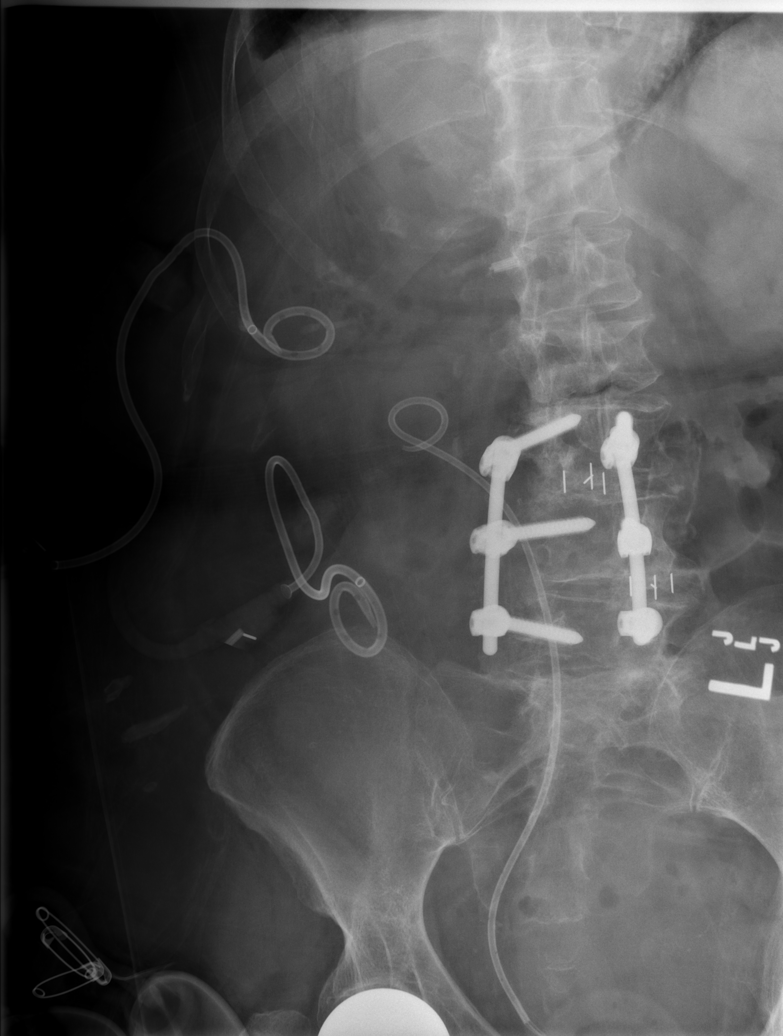

[t lumbar spine lat]
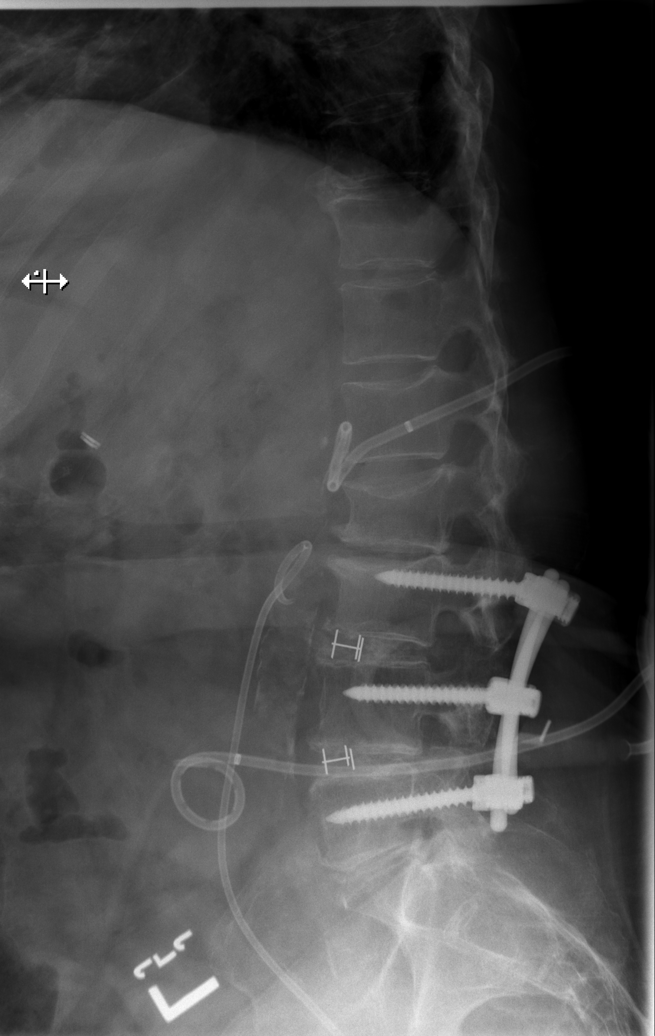

[t lumbar l-5 s-1 spot]
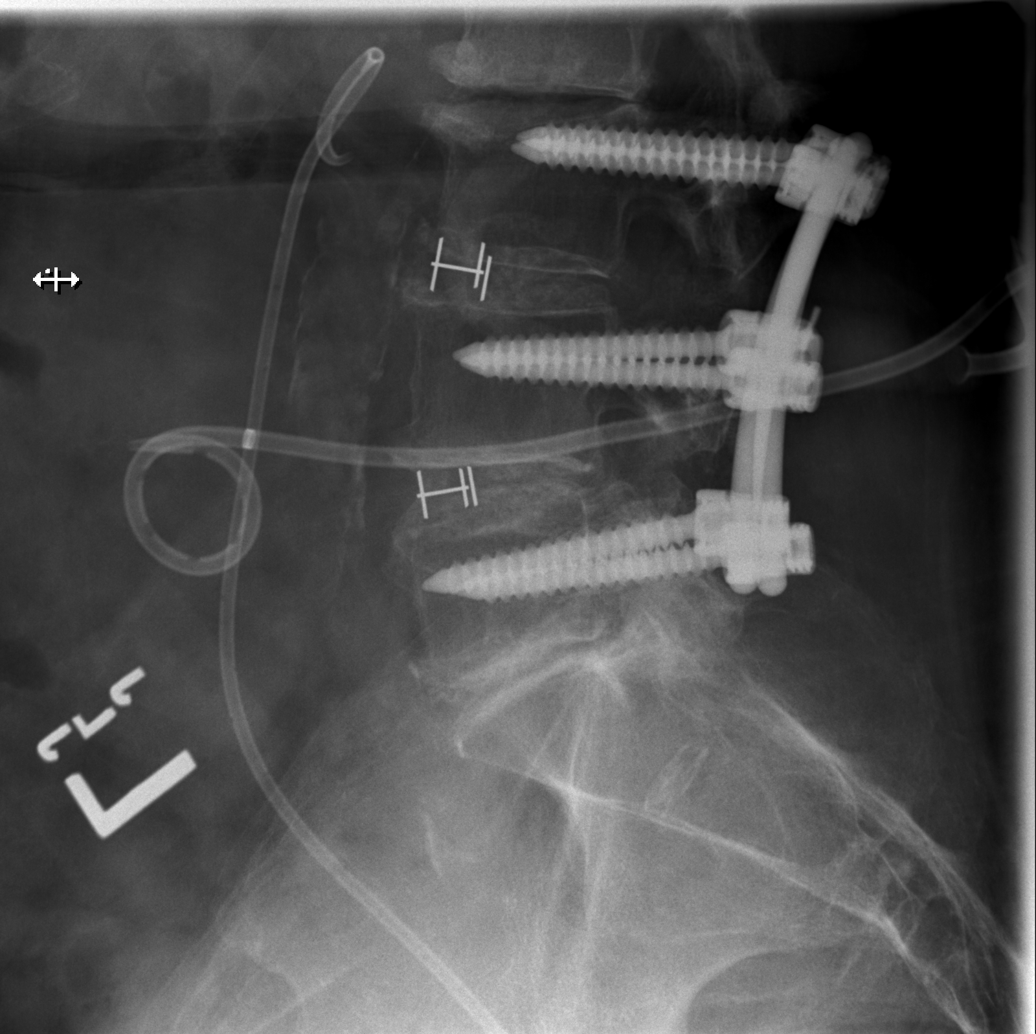

[5 of 5 positions shown; findings below may reference images not displayed]

FINDINGS: Mild levoscoliosis with straightening of the normal lumbar lordosis,
stable. No listhesis or malalignment. Compression deformity
involving the L2 vertebral body, unchanged. There is increased
concavity at the superior endplate of L1 from previous, which could
reflect an acute endplate compression fracture. Mild 10-50% central
height loss without retropulsion. Vertebral body heights otherwise
maintained. Sacrum and pelvis intact. Patient is status post
posterior fusion at L3 through L5. No hardware complication.

Right-sided percutaneous nephrostomy tube with ureteral stent and
pigtail drain noted. Cholecystectomy clips. No acute soft tissue
abnormality.

Right total hip arthroplasty noted.  Osteopenia.
IMPRESSION: 1. Increase conchae cavity at the superior endplate of L1 as
compared to previous CT from 01/09/2019, suspicious for acute
compression fracture. Associated height loss of 15% without
retropulsion.
2. Otherwise stable appearance of lumbar spine with no other acute
abnormality. Posterior fusion at L3-L5 without complication.

## 2019-11-30 ENCOUNTER — Other Ambulatory Visit: Payer: Self-pay | Admitting: Cardiology

## 2019-11-30 NOTE — Telephone Encounter (Signed)
Rx refill sent to pharmacy. 

## 2019-12-05 ENCOUNTER — Other Ambulatory Visit: Payer: Self-pay | Admitting: Cardiology

## 2019-12-29 ENCOUNTER — Encounter: Payer: Self-pay | Admitting: Cardiology

## 2019-12-29 ENCOUNTER — Ambulatory Visit (INDEPENDENT_AMBULATORY_CARE_PROVIDER_SITE_OTHER): Payer: Medicare Other | Admitting: Cardiology

## 2019-12-29 ENCOUNTER — Other Ambulatory Visit: Payer: Self-pay

## 2019-12-29 VITALS — BP 116/82 | HR 70 | Temp 97.8°F | Ht 67.0 in | Wt 183.0 lb

## 2019-12-29 DIAGNOSIS — I2510 Atherosclerotic heart disease of native coronary artery without angina pectoris: Secondary | ICD-10-CM | POA: Diagnosis not present

## 2019-12-29 DIAGNOSIS — I214 Non-ST elevation (NSTEMI) myocardial infarction: Secondary | ICD-10-CM

## 2019-12-29 DIAGNOSIS — I5032 Chronic diastolic (congestive) heart failure: Secondary | ICD-10-CM

## 2019-12-29 DIAGNOSIS — E782 Mixed hyperlipidemia: Secondary | ICD-10-CM | POA: Diagnosis not present

## 2019-12-29 DIAGNOSIS — I480 Paroxysmal atrial fibrillation: Secondary | ICD-10-CM

## 2019-12-29 NOTE — Progress Notes (Signed)
Cardiology Office Note:    Date:  12/29/2019   ID:  [facility]pp [facility], DOB 1941-05-07, MRN [id number]  PCP:  [name], MD  Cardiologist:  Jenne Campus, MD    Referring MD: [name], MD   No chief complaint on file. Doing well cardiac wise  History of Present Illness:    [name] [name] is a 79 y.o. female with past medical history significant for paroxysmal atrial fibrillation, successfully suppressed with flecainide as well as anticoagulated with, essential hypertension, cardiovascular congestive heart failure.  She comes today 2 months for follow-up.  Overall she is doing very well cardiac wise denies have any palpitations no shortness of breath no chest pain tightness squeezing pressure burning chest.  She looks good.  There is some conversation about potentially having nephrectomy done.  Decision has not been made yet.  She denies having any cardiac complaints.  Past Medical History:  Diagnosis Date  . Abdominal aortic atherosclerosis ([place]) 05/07/2016   Noted on CT abd/pelvis  . Acute renal failure ([place]) 06/04/2013  . Acute respiratory failure with hypoxia ([place]) 06/04/2013  . Anemia, unspecified 06/08/2013  . Anticoagulant long-term use    ELIQUIS  . Chronic anticoagulation 08/04/2017  . Chronic diastolic (congestive) heart failure (HCC)    hx of  . Chronic diastolic congestive heart failure ([place]) 05/31/2015  . Chronic kidney disease 2014   arf on dialysis in icu  . Congenital vaginal enterocele 04/15/2016  . Coronary artery disease   . Cystocele, midline    followed by dr c. [facility] in [place])  pt uses pessary  . Diarrhea 06/11/2013  . Diverticulosis 05/07/2016   Noted on CT abd/pelvis  . Dyslipidemia 05/31/2015  . E coli bacteremia 06/08/2013  . Encounter for monitoring flecainide therapy 07/30/2016  . Female genital prolapse 04/15/2016  . First degree heart block   . General weakness   . GERD (gastroesophageal  reflux disease)   . Hematuria    intermittently due to ureter right stent  . Hiatal hernia   . High risk medication use 04/15/2016  . History of arteriovenostomy for renal dialysis ([place])   . History of colon polyps   . History of gastric polyp   . History of kidney infection    12-31-2018  perinephrtic abscess  right side  s/p drains x2 01-10-2019,  05/ 2020 drains removed  . History of kidney stones   . History of peptic ulcer disease   . History of septic shock    2011 (pt unaware) and 06-04-2013  w/ acute renal failure  . History of uterine cancer    STAGE I  --  S/P TAH W/ BSO  (NO OTHER TX)  . History of ventilator dependency ([place]) 2014   in setting of septic shock  . Hydronephrosis 05/07/2016  . Hyperlipemia 04/15/2016  . Hyperlipidemia   . Hypertension   . Iron deficiency anemia hematology/ oncology--- dr Bobby Rumpf at [facility] in [place]--- per lov note 08-07-2017  stabilized and felt to be more chronic anemia   01/ 2018  dx iron def. anemia  s/p  IV Iron infusion and taking oral iron supplement  . Lactic acidosis 06/04/2013  . Lumbar compression fracture ([place])    01-22-2019 per imaging L1  (06-01-2019 per pt currently no pain)  . Midline cystocele 04/15/2016  . NSTEMI, initial episode of care ([place]) 06/07/2013  . Occlusion of ureteral stent ([place])   . PAF (paroxysmal atrial fibrillation) (  [facility]) DX JUNE 2012   CARDIOLOGIST--  DR Vernie Shanks--- [facility])  CHADS2  . Palpitations 04/15/2016  . Paroxysmal atrial fibrillation ([place]) 08/04/2017  . Presence of pessary   . Pyelonephritis 06/04/2013  . Right wrist fracture    per pt fractured on 03-10-2019,  had closed reduction and wearing a brace  . Sepsis ([place]) 05/07/2016  . Septic shock(785.52) 06/04/2013  . Tendonitis, Achilles 04/15/2016  . Ureteral obstruction, right 06/04/2013  . Ureteral stricture, right urologist-- dr Tresa Moore   Chronic--- treated with ureteral stent  . Urinary tract infection  without hematuria   . Uses walker    and wheelchair for longer distance  . Wears glasses     Past Surgical History:  Procedure Laterality Date  . CARDIOVERSION  09/ 2018   dr Agustin Cree   successful ([facility] )  . CHOLECYSTECTOMY  2010  . COLONOSCOPY W/ POLYPECTOMY  05/2017  . CYSTO/  BALLOON DILATION RIGHT URETERAL STRICTURE/ STENT PLACEMENT  06-02-2013  . CYSTO/  BILATERAL RETROGRADE PYELOGRAM/ RIGHT URETEROSCOPY AND STENT PLACEMENT  10-04-2005  . CYSTO/ LEFT URETEROSCOPIC STONE EXTRACTION  04-03-2006  . CYSTOSCOPY W/ RETROGRADES Right 01/03/2019   Procedure: CYSTOSCOPY WITH RETROGRADE PYELOGRAM RIGHT WITH STENT EXCHANGE;  Surgeon: Irine Seal, MD;  Location: [facility];  Service: Urology;  Laterality: Right;  . CYSTOSCOPY W/ URETERAL STENT PLACEMENT Right 02/06/2014   Procedure: CYSTOSCOPY WITH RIGHT RETROGRADE PYELOGRAM RIGHT STENT REMOVAL and REPLACEMENT, Right Ureteroscopy;  Surgeon: Ailene Rud, MD;  Location: [facility];  Service: Urology;  Laterality: Right;  . CYSTOSCOPY W/ URETERAL STENT PLACEMENT Right 11/26/2016   Procedure: CYSTOSCOPY WITH RETROGRADE PYELOGRAM/URETERAL STENT REPLACEMENT;  Surgeon: Alexis Frock, MD;  Location[facility];  Service: Urology;  Laterality: Right;  . CYSTOSCOPY W/ URETERAL STENT PLACEMENT Right 04/24/2017   Procedure: CYSTOSCOPY WITH RETROGRADE PYELOGRAM/URETERAL STENT REPLACEMENT;  Surgeon: Alexis Frock, MD;  Location[facility];  Service: Urology;  Laterality: Right;  . CYSTOSCOPY W/ URETERAL STENT PLACEMENT Right 10/07/2017   Procedure: CYSTOSCOPY WITH RETROGRADE PYELOGRAM/URETERAL STENT EXCHANGE;  Surgeon: Alexis Frock, MD;  Location: [facility];  Service: Urology;  Laterality: Right;  . CYSTOSCOPY W/ URETERAL STENT PLACEMENT Right 03/03/2018   Procedure: CYSTOSCOPY WITH RIGHT RETROGRADE / RIGHT URETERAL STENT EXCHANGE;  Surgeon: Alexis Frock, MD;  [facility];  Service:  Urology;  Laterality: Right;  . CYSTOSCOPY W/ URETERAL STENT PLACEMENT Right 09/22/2018   Procedure: CYSTOSCOPY WITH RETROGRADE PYELOGRAM/URETERAL STENT PLACEMENT;  Surgeon: Alexis Frock, MD;  Location: [facility];  Service: Urology;  Laterality: Right;  45 MINS  . CYSTOSCOPY W/ URETERAL STENT PLACEMENT Right 06/03/2019   Procedure: CYSTOSCOPY WITH RETROGRADE PYELOGRAM/URETERAL STENT REPLACEMENT;  Surgeon: Alexis Frock, MD;  Location: [facility];  Service: Urology;  Laterality: Right;  . CYSTOSCOPY W/ URETERAL STENT PLACEMENT Right 11/23/2019   Procedure: CYSTOSCOPY WITH RETROGRADE PYELOGRAM/URETERAL STENT PLACEMENT;  Surgeon: Alexis Frock, MD;  Location: [facility];  Service: Urology;  Laterality: Right;  45 MINS  . CYSTOSCOPY WITH LITHOLAPAXY N/A 11/21/2013   Procedure: CYSTOSCOPY WITH LITHOLAPAXY;  Surgeon: Ailene Rud, MD;  Location[facility];  Service: Urology;  Laterality: N/A;  . CYSTOSCOPY WITH RETROGRADE PYELOGRAM, URETEROSCOPY AND STENT PLACEMENT Bilateral 03/15/2014   Procedure: CYSTOSCOPY WITH BILATERAL RETROGRADE PYELOGRAM, RIGHT DIAGNOSTIC URETEROSCOPY AND STENT EXCHANGE;  Surgeon: Alexis Frock, MD;  [facility];  Service: Urology;  Laterality: Bilateral;  . CYSTOSCOPY WITH STENT  PLACEMENT Right 05/07/2016   Procedure: CYSTOSCOPY WITH RETROGRADE PYELOGRAM, URETERAL STENT PLACEMENT;  Surgeon: Franchot Gallo, MD;  Location[facility];  Service: Urology;  Laterality: Right;  . HEMIARTHROPLASTY HIP Right 11/2016   fractured  . IR CATHETER TUBE CHANGE  02/04/2019  . IR CATHETER TUBE CHANGE  02/04/2019  . IR CATHETER TUBE CHANGE  02/11/2019  . IR RADIOLOGIST EVAL & MGMT  03/09/2019  . JOINT REPLACEMENT    . LUMBAR FUSION  JULY 2013  . NEPHROLITHOTOMY  X2 YRS AGO  . PERCUTANEOUS NEPHROSTOMY  12/2018  . TOTAL ABDOMINAL HYSTERECTOMY W/ BILATERAL SALPINGOOPHORECTOMY  1989  . TOTAL KNEE ARTHROPLASTY Bilateral  1992  &  1996  . TRANSTHORACIC ECHOCARDIOGRAM  07-24-2017   dr Agustin Cree   ef 60-65% (improved from last echo 2014, was 45-50%)/  trace TR/  mild AV sclerosis without stenosis  . URETEROSCOPY Right 11/21/2013   Procedure: URETEROSCOPY WITH STENT REMOVAL AND REPLACEMENT;  Surgeon: Ailene Rud, MD;  Location: [facility];  Service: Urology;  Laterality: Right;    Current Medications: Current Meds  Medication Sig  . acetaminophen (TYLENOL) 500 MG tablet Take 1,000 mg by mouth 2 (two) times daily as needed for moderate pain or headache.  [name] amitriptyline (ELAVIL) 25 MG tablet Take 25 mg by mouth at bedtime.  . carvedilol (COREG) 6.25 MG tablet TAKE 1/2 TABLET BY MOUTH TWICE DAILY  . diltiazem (CARDIZEM CD) 120 MG 24 hr capsule Take 1 capsule (120 mg total) by mouth every morning.  [name] ELIQUIS 5 MG TABS tablet TAKE ONE TABLET BY MOUTH TWICE DAILY  . flecainide (TAMBOCOR) 50 MG tablet Take 1.5 tablets (75 mg total) by mouth 2 (two) times daily.  . hydroxyurea (HYDREA) 500 MG capsule Take 500 mg by mouth. May take with food to minimize GI side effects.monday Wednesday friday  . Meth-Hyo-M Bl-Na Phos-Ph Sal (URO-MP PO) Take 1 capsule by mouth 2 (two) times daily.  [name] omeprazole (PRILOSEC) 40 MG capsule Take 40 mg by mouth every morning.   . ondansetron (ZOFRAN-ODT) 4 MG disintegrating tablet Take 4 mg by mouth every 6 (six) hours as needed for nausea/vomiting.     Allergies:   Codeine, Hydrocodone, and Macrodantin [nitrofurantoin]   Social History   Socioeconomic History  . Marital status: Married    Spouse name: Not on file  . Number of children: Not on file  . Years of education: Not on file  . Highest education level: Not on file  Occupational History  . Occupation: Retired from [employer]  . Smoking status: Never Smoker  . Smokeless tobacco: Never Used  Substance and Sexual Activity  . Alcohol use: No  . Drug use: No  . Sexual activity: Not on file    Other Topics Concern  . Not on file  Social History Narrative   Lives in [place] with husband. Drives, walks some, but not very active at home.    Social Determinants of Health   Financial Resource Strain:   . Difficulty of Paying Living Expenses: Not on file  Food Insecurity:   . Worried About Charity fundraiser in the Last Year: Not on file  . Ran Out of Food in the Last Year: Not on file  Transportation Needs:   . Lack of Transportation (Medical): Not on file  . Lack of Transportation (Non-Medical): Not on file  Physical Activity:   . Days of Exercise per Week: Not on file  . Minutes of Exercise  per Session: Not on file  Stress:   . Feeling of Stress : Not on file  Social Connections:   . Frequency of Communication with Friends and Family: Not on file  . Frequency of Social Gatherings with Friends and Family: Not on file  . Attends Religious Services: Not on file  . Active Member of Clubs or Organizations: Not on file  . Attends Archivist Meetings: Not on file  . Marital Status: Not on file     Family History: The patient's family history includes Cancer in her mother; Heart disease in her father and mother; Heart failure in her father and mother. ROS:   Please see the history of present illness.    All 14 point review of systems negative except as described per history of present illness  EKGs/Labs/Other Studies Reviewed:    Showed normal sinus rhythm,  normal PR interval, normal QS complex duration morphology no ST segment changes Recent Labs: 01/07/2019: ALT 13 01/12/2019: Magnesium 1.7 01/13/2019: Platelets 536 11/23/2019: BUN 18; Creatinine, Ser 1.00; Hemoglobin 15.6; Potassium 4.1; Sodium 141  Recent Lipid Panel No results found for: CHOL, TRIG, HDL, CHOLHDL, VLDL, LDLCALC, LDLDIRECT  Physical Exam:    VS:  BP 116/82   Pulse 70   Temp 97.8 F (36.6 C)   Ht 5\' 7"  (1.702 m)   Wt 183 lb (83 kg)   SpO2 97%   BMI 28.66 kg/m     Wt Readings from  Last 3 Encounters:  12/29/19 183 lb (83 kg)  11/23/19 177 lb 11.2 oz (80.6 kg)  08/25/19 168 lb (76.2 kg)     GEN:  Well nourished, well developed in no acute distress HEENT: Normal NECK: No JVD; No carotid bruits LYMPHATICS: No lymphadenopathy CARDIAC: RRR, no murmurs, no rubs, no gallops RESPIRATORY:  Clear to auscultation without rales, wheezing or rhonchi  ABDOMEN: Soft, non-tender, non-distended MUSCULOSKELETAL:  No edema; No deformity  SKIN: Warm and dry LOWER EXTREMITIES: no swelling NEUROLOGIC:  Alert and oriented x 3 PSYCHIATRIC:  Normal affect   ASSESSMENT:    1. Paroxysmal atrial fibrillation (HCC)   2. Chronic diastolic congestive heart failure ([place])   3. Coronary artery disease involving native coronary artery of native heart without angina pectoris   4. NSTEMI, initial episode of care ([place])   5. Mixed hyperlipidemia    PLAN:    In order of problems listed above:  1. Paroxysmal atrial fibrillation.  Anticoagulated which I will continue, and if there is need to do surgery healthy, she can be visual for surgery. 2. Chronic diastolic congestive heart failure hemodynamically compensated.  We will repeat echocardiogram to check left ventricle ejection fraction. 3. History of coronary artery disease with multiple recent issues.  Stable continue present management. 4. Chronic urological problem with conversation about nephrectomy.  She will let me know if surgery will happen well.   Medication Adjustments/Labs and Tests Ordered: Current medicines are reviewed at length with the patient today.  Concerns regarding medicines are outlined above.  No orders of the defined types were placed in this encounter.  Medication changes: No orders of the defined types were placed in this encounter.   Signed, Park Liter, MD,  [facility] 12/29/2019 3:02 PM    [facility]

## 2019-12-29 NOTE — Addendum Note (Signed)
Addended by: Ashok Norris on: 12/29/2019 04:33 PM   Modules accepted: Orders

## 2019-12-29 NOTE — Addendum Note (Signed)
Addended by: Ashok Norris on: 12/29/2019 03:09 PM   Modules accepted: Orders

## 2019-12-29 NOTE — Patient Instructions (Signed)
Medication Instructions:  Your physician recommends that you continue on your current medications as directed. Please refer to the Current Medication list given to you today.  *If you need a refill on your cardiac medications before your next appointment, please call your pharmacy*   Lab Work: None. If you have labs (blood work) drawn today and your tests are completely normal, you will receive your results only by: . MyChart Message (if you have MyChart) OR . A paper copy in the mail If you have any lab test that is abnormal or we need to change your treatment, we will call you to review the results.   Testing/Procedures: Your physician has requested that you have an echocardiogram. Echocardiography is a painless test that uses sound waves to create images of your heart. It provides your doctor with information about the size and shape of your heart and how well your heart's chambers and valves are working. This procedure takes approximately one hour. There are no restrictions for this procedure.     Follow-Up: At [facility], you and your health needs are our priority.  As part of our continuing mission to provide you with exceptional heart care, we have created designated Provider Care Teams.  These Care Teams include your primary Cardiologist (physician) and Advanced Practice Providers (APPs -  Physician Assistants and Nurse Practitioners) who all work together to provide you with the care you need, when you need it.  We recommend signing up for the patient portal called "MyChart".  Sign up information is provided on this After Visit Summary.  MyChart is used to connect with patients for Virtual Visits (Telemedicine).  Patients are able to view lab/test results, encounter notes, upcoming appointments, etc.  Non-urgent messages can be sent to your provider as well.   To learn more about what you can do with MyChart, go to [email or web address].com.    Your next appointment:   5  month(s)  The format for your next appointment:   In Person  Provider:   Robert Krasowski, MD   Other Instructions   Echocardiogram An echocardiogram is a procedure that uses painless sound waves (ultrasound) to produce an image of the heart. Images from an echocardiogram can provide important information about:  Signs of coronary artery disease (CAD).  Aneurysm detection. An aneurysm is a weak or damaged part of an artery wall that bulges out from the normal force of blood pumping through the body.  Heart size and shape. Changes in the size or shape of the heart can be associated with certain conditions, including heart failure, aneurysm, and CAD.  Heart muscle function.  Heart valve function.  Signs of a past heart attack.  Fluid buildup around the heart.  Thickening of the heart muscle.  A tumor or infectious growth around the heart valves. Tell a health care provider about:  Any allergies you have.  All medicines you are taking, including vitamins, herbs, eye drops, creams, and over-the-counter medicines.  Any blood disorders you have.  Any surgeries you have had.  Any medical conditions you have.  Whether you are pregnant or may be pregnant. What are the risks? Generally, this is a safe procedure. However, problems may occur, including:  Allergic reaction to dye (contrast) that may be used during the procedure. What happens before the procedure? No specific preparation is needed. You may eat and drink normally. What happens during the procedure?   An IV tube may be inserted into one of your veins.  You may receive   contrast through this tube. A contrast is an injection that improves the quality of the pictures from your heart.  A gel will be applied to your chest.  A wand-like tool (transducer) will be moved over your chest. The gel will help to transmit the sound waves from the transducer.  The sound waves will harmlessly bounce off of your heart to  allow the heart images to be captured in real-time motion. The images will be recorded on a computer. The procedure may vary among health care providers and hospitals. What happens after the procedure?  You may return to your normal, everyday life, including diet, activities, and medicines, unless your health care provider tells you not to do that. Summary  An echocardiogram is a procedure that uses painless sound waves (ultrasound) to produce an image of the heart.  Images from an echocardiogram can provide important information about the size and shape of your heart, heart muscle function, heart valve function, and fluid buildup around your heart.  You do not need to do anything to prepare before this procedure. You may eat and drink normally.  After the echocardiogram is completed, you may return to your normal, everyday life, unless your health care provider tells you not to do that. This information is not intended to replace advice given to you by your health care provider. Make sure you discuss any questions you have with your health care provider. Document Revised: 02/03/2019 Document Reviewed: 11/15/2016 Elsevier Patient Education  2020 Elsevier Inc.   

## 2020-01-11 DIAGNOSIS — K5730 Diverticulosis of large intestine without perforation or abscess without bleeding: Secondary | ICD-10-CM | POA: Diagnosis not present

## 2020-01-11 DIAGNOSIS — K219 Gastro-esophageal reflux disease without esophagitis: Secondary | ICD-10-CM | POA: Diagnosis not present

## 2020-01-13 ENCOUNTER — Telehealth: Payer: Self-pay | Admitting: *Deleted

## 2020-01-13 NOTE — Telephone Encounter (Signed)
   [facility] Pre-operative Risk Assessment    Request for surgical clearance:  1. What type of surgery is being performed?  COLONOSCOPY   2. When is this surgery scheduled?  02/10/20   3. What type of clearance is required (medical clearance vs. Pharmacy clearance to hold med vs. Both)?  BOTH  4. Are there any medications that need to be held prior to surgery and how long? ELIQUIS   5. Practice name and name of physician performing surgery?  [facility] / DR. MISENHEIMER   6. What is your office phone number  [phone]   7.   What is your office fax number [phone]  8.   Anesthesia type (None, local, MAC, general) ?  PROPOFOL?   Jeanann Lewandowsky 01/13/2020, 4:10 PM  _________________________________________________________________   (provider comments below)

## 2020-01-16 NOTE — Telephone Encounter (Signed)
Pharmacy, can you please comment on how long patient can hold Eliquis for upcoming colonoscopy?  Thank you! 

## 2020-01-16 NOTE — Telephone Encounter (Signed)
Patient with diagnosis of atrial fibrillation on Eliquis for anticoagulation.    Procedure: Colonoscopy Date of procedure: 02/10/20  CHADS2-VASc score of 6 (CHF, HTN, AGEx2, CAD, female)  CrCl 61.7 ml/min Platelet count 536  Per office protocol, patient can hold Eliquis for 1 day prior to procedure.

## 2020-01-17 NOTE — Telephone Encounter (Signed)
Patient has a history of paroxysmal atrial fibrillation on Flecainide and Eliquis, diastolic CHF, and hypertension. She was recently seen by Dr. .Agustin Cree on 12/29/2019 and was doing well form a cardiac standpoint. However, repeat Echo was ordered to reassess EF. Echo is scheduled for 02/01/2020 and colonoscopy is not until 02/10/2020. Therefore, will wait and follow-up on Echo results.

## 2020-02-01 ENCOUNTER — Telehealth: Payer: Self-pay | Admitting: *Deleted

## 2020-02-01 ENCOUNTER — Ambulatory Visit (INDEPENDENT_AMBULATORY_CARE_PROVIDER_SITE_OTHER): Payer: Medicare Other

## 2020-02-01 ENCOUNTER — Other Ambulatory Visit: Payer: Self-pay

## 2020-02-01 DIAGNOSIS — I5032 Chronic diastolic (congestive) heart failure: Secondary | ICD-10-CM

## 2020-02-01 DIAGNOSIS — I480 Paroxysmal atrial fibrillation: Secondary | ICD-10-CM | POA: Diagnosis not present

## 2020-02-01 NOTE — Progress Notes (Signed)
Complete echocardiogram performed.  Jimmy Nadeen Shipman RDCS, RVT  

## 2020-02-01 NOTE — Telephone Encounter (Signed)
   [facility] Pre-operative Risk Assessment    Request for surgical clearance:  1. What type of surgery is being performed? Colonoscopy   2. When is this surgery scheduled? 02/07/20   3. What type of clearance is required (medical clearance vs. Pharmacy clearance to hold med vs. Both)? Pharmacy & Medical  4. Are there any medications that need to be held prior to surgery and how long?Eliquis   5. Practice name and name of physician performing surgery? [facility]   6. What is your office phone number [phone]   7.   What is your office fax number [phone]  8.   Anesthesia type (None, local, MAC, general) ? None Specified   Roselie Skinner 02/01/2020, 10:29 AM  _________________________________________________________________   (provider comments below)

## 2020-02-01 NOTE — Telephone Encounter (Signed)
Duplicate, per Jerl Santos.. Will just close encounter

## 2020-02-02 NOTE — Telephone Encounter (Signed)
I do not think we need to do any cardiac testing, she does have urological procedure done every few months, rhythm looks good actually I just read it.  Colonoscopy is considered a low risk procedure therefore I think we can proceed.

## 2020-02-02 NOTE — Telephone Encounter (Signed)
Dr. Agustin Cree -  This patient is unable to complete 4.0 METS (she uses a rolling walker). She has not had a recent stress test, but carries a diagnosis of CAD. I do see her echo results from yesterday. Do you recommend cardiac clearance for colonoscopy?  She also reported upcoming kidney surgery. Does she need additional ischemic evaluation prior to kidney surgery?

## 2020-02-03 ENCOUNTER — Telehealth: Payer: Self-pay

## 2020-02-03 DIAGNOSIS — J3489 Other specified disorders of nose and nasal sinuses: Secondary | ICD-10-CM | POA: Diagnosis not present

## 2020-02-03 DIAGNOSIS — Z1159 Encounter for screening for other viral diseases: Secondary | ICD-10-CM | POA: Diagnosis not present

## 2020-02-03 DIAGNOSIS — Z20828 Contact with and (suspected) exposure to other viral communicable diseases: Secondary | ICD-10-CM | POA: Diagnosis not present

## 2020-02-03 NOTE — Telephone Encounter (Signed)
-----   Message from Park Liter, MD sent at 02/02/2020  9:48 PM EDT ----- Echocardiogram showed improvement left ventricle ejection fraction now it is normal.  There is mild dilatation of the aortic root, just medical therapy overall echocardiogram looks good quite good

## 2020-02-03 NOTE — Telephone Encounter (Signed)
I have re-faxed my note, second time today. Can you please make sure they received the letter? May need to be manually faxed.

## 2020-02-03 NOTE — Telephone Encounter (Signed)
   Primary Cardiologist: Jenne Campus, MD  Chart reviewed as part of pre-operative protocol coverage. Patient was contacted 02/03/2020 in reference to pre-operative risk assessment for pending surgery as outlined below.  [name] [name] was last seen on 12/29/19 by Dr. Agustin Cree.  Since that day, [name] [name] has done well. She had an echocardiogram that showed normal EF, no WMA, and no significant valvular disease. Per Dr. Agustin Cree, she is cleared for colonoscopy.   Per our clinical pharmacist: Patient with diagnosis of atrial fibrillation on Eliquis for anticoagulation.  Procedure: Colonoscopy  Date of procedure: 02/10/20  CHADS2-VASc score of 6 (CHF, HTN, AGEx2, CAD, female)  CrCl 61.7 ml/min  Platelet count 536   Per office protocol, patient can hold Eliquis for 1 day prior to procedure   Therefore, based on ACC/AHA guidelines, the patient would be at acceptable risk for the planned procedure without further cardiovascular testing.   I will route this recommendation to the requesting party via Epic fax function and remove from pre-op pool.  Please call with questions.  Tami Lin Satvik Parco, PA 02/03/2020, 8:24 AM

## 2020-02-03 NOTE — Telephone Encounter (Signed)
Tried calling patient. No answer and no voicemail set up for me to leave a message. 

## 2020-02-03 NOTE — Telephone Encounter (Signed)
Tried contacting [facility] but they are closed on Fridays.   Hours of Operations   Monday - Thursday: 8:30 am - 12:30 pm            2:00 pm - 5:00 pm  Friday: Closed

## 2020-02-03 NOTE — Telephone Encounter (Signed)
   [facility] Pre-operative Risk Assessment    Request for surgical clearance:  1. What type of surgery is being performed?  Colonoscopy   2. When is this surgery scheduled? 02-10-20   3. What type of clearance is required (medical clearance vs. Pharmacy clearance to hold med vs. Both)? Medical and Pharmacy  4. Are there any medications that need to be held prior to surgery and how long? Elequis needs to be stopped on 02-07-20 if possible.    5. Practice name and name of physician performing surgery? [facility]   6. What is your office phone number [phone]    7.   What is your office fax number [phone]  8.   Anesthesia type (None, local, MAC, general) ? Unknown   Gita Kudo 02/03/2020, 11:27 AM  _________________________________________________________________   (provider comments below)

## 2020-02-06 NOTE — Telephone Encounter (Signed)
Left message on patients voicemail to please return our call.   

## 2020-02-06 NOTE — Telephone Encounter (Signed)
Spoke with patient regarding results and recommendation.  Patient verbalizes understanding and is agreeable to plan of care. Advised patient to call back with any issues or concerns.  

## 2020-02-08 ENCOUNTER — Telehealth: Payer: Self-pay | Admitting: Cardiology

## 2020-02-08 NOTE — Telephone Encounter (Signed)
Patient states she received a call this morning. She states she does not know what it was about. I did not see a note.

## 2020-02-08 NOTE — Telephone Encounter (Signed)
Called patient, advised I did not see anything in her chart about why someone was calling her. Results were given, and no other notes in the chart.  Patient verbalized understanding.

## 2020-02-10 DIAGNOSIS — Z90710 Acquired absence of both cervix and uterus: Secondary | ICD-10-CM | POA: Diagnosis not present

## 2020-02-10 DIAGNOSIS — K648 Other hemorrhoids: Secondary | ICD-10-CM | POA: Diagnosis not present

## 2020-02-10 DIAGNOSIS — Z79899 Other long term (current) drug therapy: Secondary | ICD-10-CM | POA: Diagnosis not present

## 2020-02-10 DIAGNOSIS — K5730 Diverticulosis of large intestine without perforation or abscess without bleeding: Secondary | ICD-10-CM | POA: Diagnosis not present

## 2020-02-10 DIAGNOSIS — I4891 Unspecified atrial fibrillation: Secondary | ICD-10-CM | POA: Diagnosis not present

## 2020-02-10 DIAGNOSIS — K635 Polyp of colon: Secondary | ICD-10-CM | POA: Diagnosis not present

## 2020-02-10 DIAGNOSIS — I10 Essential (primary) hypertension: Secondary | ICD-10-CM | POA: Diagnosis not present

## 2020-02-10 DIAGNOSIS — Z86010 Personal history of colonic polyps: Secondary | ICD-10-CM | POA: Diagnosis not present

## 2020-02-10 DIAGNOSIS — D127 Benign neoplasm of rectosigmoid junction: Secondary | ICD-10-CM | POA: Diagnosis not present

## 2020-02-10 DIAGNOSIS — D124 Benign neoplasm of descending colon: Secondary | ICD-10-CM | POA: Diagnosis not present

## 2020-02-10 DIAGNOSIS — D122 Benign neoplasm of ascending colon: Secondary | ICD-10-CM | POA: Diagnosis not present

## 2020-02-10 DIAGNOSIS — Z8542 Personal history of malignant neoplasm of other parts of uterus: Secondary | ICD-10-CM | POA: Diagnosis not present

## 2020-02-10 DIAGNOSIS — D126 Benign neoplasm of colon, unspecified: Secondary | ICD-10-CM | POA: Diagnosis not present

## 2020-02-10 DIAGNOSIS — D128 Benign neoplasm of rectum: Secondary | ICD-10-CM | POA: Diagnosis not present

## 2020-02-10 DIAGNOSIS — Z9049 Acquired absence of other specified parts of digestive tract: Secondary | ICD-10-CM | POA: Diagnosis not present

## 2020-02-20 ENCOUNTER — Other Ambulatory Visit: Payer: Self-pay | Admitting: Urology

## 2020-02-20 ENCOUNTER — Telehealth: Payer: Self-pay | Admitting: Cardiology

## 2020-02-20 DIAGNOSIS — N1330 Unspecified hydronephrosis: Secondary | ICD-10-CM | POA: Diagnosis not present

## 2020-02-20 DIAGNOSIS — N3020 Other chronic cystitis without hematuria: Secondary | ICD-10-CM | POA: Diagnosis not present

## 2020-02-20 NOTE — Telephone Encounter (Signed)
   [facility] Pre-operative Risk Assessment    Request for surgical clearance:  1. What type of surgery is being performed?  Rt Robatic nephrectomy   2. When is this surgery scheduled? 03-09-20   3. What type of clearance is required (medical clearance vs. Pharmacy clearance to hold med vs. Both)? Both  4. Are there any medications that need to be held prior to surgery and how long? Eliquis   5. Practice name and name of physician performing surgery?  Dr Alexis Frock   6. What is your office phone number [phone]    7.   What is your office fax number [phone]  8.   Anesthesia type (None, local, MAC, general) ?  General   [name] [name] 02/20/2020, 1:24 PM  _________________________________________________________________   (provider comments below)

## 2020-02-21 NOTE — Telephone Encounter (Signed)
Patient with diagnosis of afib on Eliquis for anticoagulation.    Procedure: Rt Robatic nephrectomy  Date of procedure: 03/09/20  CHADS2-VASc score of  6 (CHF, HTN, AGE, CAD, AGE, female)  CrCl 51 ml/min  Per office protocol, patient can hold Eliquis for 2 days prior to procedure.

## 2020-02-21 NOTE — Telephone Encounter (Signed)
   Primary Cardiologist: Jenne Campus, MD  Chart reviewed as part of pre-operative protocol coverage.   Patient has a history of paroxysmal atrial fibrillation on Flecainide and Eliquis, diastolic CHF, and hypertension. She was recently seen by Dr. .Agustin Cree on 12/29/2019 and was doing well form a cardiac standpoint. Recently cleared for colonoscopy. Echo 02/01/20 with improved LVEF.   Given past medical history and time since last visit, based on ACC/AHA guidelines, [name] [name] would be at acceptable risk for the planned procedure without further cardiovascular testing.   I will route this recommendation to the requesting party via Epic fax function and remove from pre-op pool.  Please call with questions.  [place], [place] 02/21/2020, 12:56 PM

## 2020-02-28 DIAGNOSIS — N819 Female genital prolapse, unspecified: Secondary | ICD-10-CM | POA: Diagnosis not present

## 2020-02-28 DIAGNOSIS — N8111 Cystocele, midline: Secondary | ICD-10-CM | POA: Diagnosis not present

## 2020-02-28 DIAGNOSIS — Z4689 Encounter for fitting and adjustment of other specified devices: Secondary | ICD-10-CM | POA: Diagnosis not present

## 2020-03-02 DIAGNOSIS — D473 Essential (hemorrhagic) thrombocythemia: Secondary | ICD-10-CM | POA: Diagnosis not present

## 2020-03-02 DIAGNOSIS — D509 Iron deficiency anemia, unspecified: Secondary | ICD-10-CM | POA: Diagnosis not present

## 2020-03-05 ENCOUNTER — Other Ambulatory Visit: Payer: Self-pay | Admitting: Cardiology

## 2020-03-13 NOTE — Patient Instructions (Addendum)
DUE TO COVID-19 ONLY ONE VISITOR IS ALLOWED TO COME WITH YOU AND STAY IN THE WAITING ROOM ONLY DURING PRE OP AND PROCEDURE DAY OF SURGERY. THE 1 VISITOR MAY VISIT WITH YOU AFTER SURGERY IN YOUR PRIVATE ROOM DURING VISITING HOURS ONLY!  YOU NEED TO HAVE A COVID 19 TEST ON 03-23-20 @ 10:30 AM, THIS TEST MUST BE DONE BEFORE SURGERY, COME  [address], [address] , [id number].  ([place]) ONCE YOUR COVID TEST IS COMPLETED, PLEASE BEGIN THE QUARANTINE INSTRUCTIONS AS OUTLINED IN YOUR HANDOUT.                Nivea Wojdyla Hawks Weldy  03/13/2020   Your procedure is scheduled on: 03-23-20   Report to [facility] Main  Entrance    Report to Admitting at 5:30 AM     Call this number if you have problems the morning of surgery [phone]    Remember: Do not eat food or drink liquids :After Midnight.     Take these medicines the morning of surgery with A SIP OF WATER: Carvedilol (Coreg), Diltiazem (Tiazac), Flecainide (Tambocor), Omeprazole (Prilosec), and Hydroxyurea  BRUSH YOUR TEETH MORNING OF SURGERY AND RINSE YOUR MOUTH OUT, NO CHEWING GUM CANDY OR MINTS.                               You may not have any metal on your body including hair pins and              piercings     Do not wear jewelry, make-up, lotions, powders or perfumes, deodorant              Do not wear nail polish on your fingernails.  Do not shave  48 hours prior to surgery.             Do not bring valuables to the hospital. [place].  Contacts, dentures or bridgework may not be worn into surgery.  You may bring an overnight bag    Special Instructions: N/A              Please read over the following fact sheets you were given: _____________________________________________________________________             [facility] - Preparing for Surgery Before surgery, you can play an important role.  Because skin is not sterile, your skin  needs to be as free of germs as possible.  You can reduce the number of germs on your skin by washing with CHG (chlorahexidine gluconate) soap before surgery.  CHG is an antiseptic cleaner which kills germs and bonds with the skin to continue killing germs even after washing. Please DO NOT use if you have an allergy to CHG or antibacterial soaps.  If your skin becomes reddened/irritated stop using the CHG and inform your nurse when you arrive at Short Stay. Do not shave (including legs and underarms) for at least 48 hours prior to the first CHG shower.  You may shave your face/neck. Please follow these instructions carefully:  1.  Shower with CHG Soap the night before surgery and the  morning of Surgery.  2.  If you choose to wash your hair, wash your hair first as usual with your  normal  shampoo.  3.  After you shampoo, rinse  your hair and body thoroughly to remove the  shampoo.                           4.  Use CHG as you would any other liquid soap.  You can apply chg directly  to the skin and wash                       Gently with a scrungie or clean washcloth.  5.  Apply the CHG Soap to your body ONLY FROM THE NECK DOWN.   Do not use on face/ open                           Wound or open sores. Avoid contact with eyes, ears mouth and genitals (private parts).                       Wash face,  Genitals (private parts) with your normal soap.             6.  Wash thoroughly, paying special attention to the area where your surgery  will be performed.  7.  Thoroughly rinse your body with warm water from the neck down.  8.  DO NOT shower/wash with your normal soap after using and rinsing off  the CHG Soap.                9.  Pat yourself dry with a clean towel.            10.  Wear clean pajamas.            11.  Place clean sheets on your bed the night of your first shower and do not  sleep with pets. Day of Surgery : Do not apply any lotions/deodorants the morning of surgery.  Please wear clean  clothes to the hospital/surgery center.  FAILURE TO FOLLOW THESE INSTRUCTIONS MAY RESULT IN THE CANCELLATION OF YOUR SURGERY PATIENT SIGNATURE_________________________________  NURSE SIGNATURE__________________________________  ________________________________________________________________________

## 2020-03-13 NOTE — Progress Notes (Addendum)
PCP - Kennith Maes, MD  Cardiologist - Jenne Campus, MD w/cardiac clearance from [facility]  No stimulator  Chest x-ray -  EKG - 12-29-19 Stress Test -  ECHO - 02-01-20 Cardiac Cath -   Sleep Study -  CPAP -   Fasting Blood Sugar -  Checks Blood Sugar _____ times a day  Blood Thinner Instructions: Eliquis Hold x 2 days Plan to hold 03-20-20  Aspirin Instructions: Last Dose:  Anesthesia review:   Patient denies shortness of breath, fever, cough and chest pain at PAT appointment   Patient verbalized understanding of instructions that were given to them at the PAT appointment. Patient was also instructed that they will need to review over the PAT instructions again at home before surgery.

## 2020-03-16 ENCOUNTER — Encounter (HOSPITAL_COMMUNITY): Payer: Self-pay

## 2020-03-16 ENCOUNTER — Other Ambulatory Visit: Payer: Self-pay

## 2020-03-16 ENCOUNTER — Encounter (HOSPITAL_COMMUNITY)
Admission: RE | Admit: 2020-03-16 | Discharge: 2020-03-16 | Disposition: A | Discharge status: Home / Self Care | Payer: Medicare Other | Source: Ambulatory Visit | Attending: Urology | Admitting: Urology

## 2020-03-19 ENCOUNTER — Other Ambulatory Visit: Payer: Self-pay

## 2020-03-19 ENCOUNTER — Encounter (HOSPITAL_COMMUNITY)
Admission: RE | Admit: 2020-03-19 | Discharge: 2020-03-19 | Disposition: A | Discharge status: Home / Self Care | Payer: Medicare Other | Source: Ambulatory Visit | Attending: Urology | Admitting: Urology

## 2020-03-19 DIAGNOSIS — N189 Chronic kidney disease, unspecified: Secondary | ICD-10-CM | POA: Diagnosis not present

## 2020-03-19 DIAGNOSIS — Z7901 Long term (current) use of anticoagulants: Secondary | ICD-10-CM | POA: Insufficient documentation

## 2020-03-19 DIAGNOSIS — I480 Paroxysmal atrial fibrillation: Secondary | ICD-10-CM | POA: Diagnosis not present

## 2020-03-19 DIAGNOSIS — Z01812 Encounter for preprocedural laboratory examination: Secondary | ICD-10-CM | POA: Diagnosis not present

## 2020-03-19 DIAGNOSIS — D649 Anemia, unspecified: Secondary | ICD-10-CM | POA: Diagnosis not present

## 2020-03-19 DIAGNOSIS — N3592 Unspecified urethral stricture, female: Secondary | ICD-10-CM | POA: Insufficient documentation

## 2020-03-19 DIAGNOSIS — I5032 Chronic diastolic (congestive) heart failure: Secondary | ICD-10-CM | POA: Diagnosis not present

## 2020-03-19 DIAGNOSIS — Z79899 Other long term (current) drug therapy: Secondary | ICD-10-CM | POA: Insufficient documentation

## 2020-03-19 DIAGNOSIS — E785 Hyperlipidemia, unspecified: Secondary | ICD-10-CM | POA: Diagnosis not present

## 2020-03-19 DIAGNOSIS — I130 Hypertensive heart and chronic kidney disease with heart failure and stage 1 through stage 4 chronic kidney disease, or unspecified chronic kidney disease: Secondary | ICD-10-CM | POA: Insufficient documentation

## 2020-03-19 DIAGNOSIS — K219 Gastro-esophageal reflux disease without esophagitis: Secondary | ICD-10-CM | POA: Diagnosis not present

## 2020-03-19 LAB — CBC
HCT: 47 % — ABNORMAL HIGH (ref 36.0–46.0)
Hemoglobin: 14.6 g/dL (ref 12.0–15.0)
MCH: 28.3 pg (ref 26.0–34.0)
MCHC: 31.1 g/dL (ref 30.0–36.0)
MCV: 91.3 fL (ref 80.0–100.0)
Platelets: 467 10*3/uL — ABNORMAL HIGH (ref 150–400)
RBC: 5.15 MIL/uL — ABNORMAL HIGH (ref 3.87–5.11)
RDW: 14.6 % (ref 11.5–15.5)
WBC: 7.7 10*3/uL (ref 4.0–10.5)
nRBC: 0 % (ref 0.0–0.2)

## 2020-03-19 LAB — BASIC METABOLIC PANEL
Anion gap: 9 (ref 5–15)
BUN: 24 mg/dL — ABNORMAL HIGH (ref 8–23)
CO2: 25 mmol/L (ref 22–32)
Calcium: 9 mg/dL (ref 8.9–10.3)
Chloride: 105 mmol/L (ref 98–111)
Creatinine, Ser: 0.89 mg/dL (ref 0.44–1.00)
GFR calc Af Amer: >60 mL/min (ref >60)
GFR calc non Af Amer: >60 mL/min (ref >60)
Glucose, Bld: 107 mg/dL — ABNORMAL HIGH (ref 70–99)
Potassium: 4.4 mmol/L (ref 3.5–5.1)
Sodium: 139 mmol/L (ref 135–145)

## 2020-03-20 ENCOUNTER — Other Ambulatory Visit (HOSPITAL_COMMUNITY)
Admission: RE | Admit: 2020-03-20 | Discharge: 2020-03-20 | Disposition: A | Discharge status: Home / Self Care | Payer: Medicare Other | Source: Ambulatory Visit | Attending: Urology | Admitting: Urology

## 2020-03-20 DIAGNOSIS — Z01812 Encounter for preprocedural laboratory examination: Secondary | ICD-10-CM | POA: Insufficient documentation

## 2020-03-20 DIAGNOSIS — Z20822 Contact with and (suspected) exposure to covid-19: Secondary | ICD-10-CM | POA: Insufficient documentation

## 2020-03-20 LAB — SARS CORONAVIRUS 2 (TAT 6-24 HRS): SARS Coronavirus 2: NEGATIVE

## 2020-03-20 NOTE — Progress Notes (Signed)
Anesthesia Chart Review   Case: [id number] Date/Time: 03/23/20 0700   Procedure: XI ROBOTIC ASSISTED LAPAROSCOPIC NEPHRECTOMY (Right )   Anesthesia type: General   Pre-op diagnosis: RIGHT  URETERAL STRICTURE   [facility] RO[facility] 03 / [facility]   Surgeons: Alexis Frock, MD      DISCUSSION:79 y.o. never smoker with h/o GERD, HLD, HTN, CAD, chronic diastolic heart failure, CKD, PAF (on Eliquis), right ureteral stricture scheduled for above procedure 03/23/2020 with Dr. Alexis Frock.   Per cardiac note 02/21/19, "Patient has a history of paroxysmal atrial fibrillation on Flecainide and Eliquis, diastolic CHF, and hypertension. She was recently seen by Dr. .Agustin Cree on 12/29/2019 and was doing well form a cardiac standpoint. Recently cleared for colonoscopy. Echo 02/01/20 with improved LVEF.  Given past medical history and time since last visit, based on ACC/AHA guidelines, Everlynn Sagun Betzer would be at acceptable risk for the planned procedure without further cardiovascular testing."  Pt advised to hold Eliquis 2 days prior to procedure.    Anticipate pt can proceed with planned procedure barring acute status change.   VS: BP (!) 148/84   Pulse 75   Temp 36.7 C (Oral)   Resp 16   Ht 5\' 7"  (1.702 m)   Wt 84.8 kg   SpO2 96%   BMI 29.29 kg/m   PROVIDERS: [name], MD is PCP   Tristan Schroeder, MD is Cardiologist  LABS: Labs reviewed: Acceptable for surgery. (all labs ordered are listed, but only abnormal results are displayed)  Labs Reviewed  BASIC METABOLIC PANEL - Abnormal; Notable for the following components:      Result Value   Glucose, Bld 107 (*)    BUN 24 (*)    All other components within normal limits  CBC - Abnormal; Notable for the following components:   RBC 5.15 (*)    HCT 47.0 (*)    Platelets 467 (*)    All other components within normal limits     IMAGES:   EKG: 12/29/2019 Rate 70 bpm  Sinus rhythm with 1st degree AV block   CV: Echo  02/01/2020 IMPRESSIONS    1. Left ventricular ejection fraction, by estimation, is 60 to 65%. The  left ventricle has normal function. The left ventricle has no regional  wall motion abnormalities. Left ventricular diastolic parameters are  consistent with Grade I diastolic  dysfunction (impaired relaxation).  2. Right ventricular systolic function is normal. The right ventricular  size is normal.  3. The mitral valve is normal in structure. No evidence of mitral valve  regurgitation. No evidence of mitral stenosis.  4. The aortic valve is normal in structure. Aortic valve regurgitation is  not visualized. No aortic stenosis is present.  5. There is mild dilatation of the ascending aorta measuring 38 mm.  6. The inferior vena cava is normal in size with greater than 50%  respiratory variability, suggesting right atrial pressure of 3 mmHg.  Past Medical History:  Diagnosis Date  . Abdominal aortic atherosclerosis ([place]) 05/07/2016   Noted on CT abd/pelvis  . Acute renal failure ([place]) 06/04/2013  . Acute respiratory failure with hypoxia ([place]) 06/04/2013  . Anemia, unspecified 06/08/2013  . Anticoagulant long-term use    ELIQUIS  . Chronic anticoagulation 08/04/2017  . Chronic diastolic (congestive) heart failure (HCC)    hx of  . Chronic diastolic congestive heart failure ([place]) 05/31/2015  . Chronic kidney disease 2014   arf on dialysis in icu  . Congenital vaginal  enterocele 04/15/2016  . Coronary artery disease   . Cystocele, midline    followed by dr c. Marvel Plan [facility] in [place])  pt uses pessary  . Diarrhea 06/11/2013  . Diverticulosis 05/07/2016   Noted on CT abd/pelvis  . Dyslipidemia 05/31/2015  . E coli bacteremia 06/08/2013  . Encounter for monitoring flecainide therapy 07/30/2016  . Female genital prolapse 04/15/2016  . First degree heart block   . General weakness   . GERD (gastroesophageal reflux disease)   . Hematuria    intermittently due to  ureter right stent  . Hiatal hernia   . High risk medication use 04/15/2016  . History of arteriovenostomy for renal dialysis ([place])   . History of colon polyps   . History of gastric polyp   . History of kidney infection    12-31-2018  perinephrtic abscess  right side  s/p drains x2 01-10-2019,  05/ 2020 drains removed  . History of kidney stones   . History of peptic ulcer disease   . History of septic shock    2011 (pt unaware) and 06-04-2013  w/ acute renal failure  . History of uterine cancer    STAGE I  --  S/P TAH W/ BSO  (NO OTHER TX)  . History of ventilator dependency ([place]) 2014   in setting of septic shock  . Hydronephrosis 05/07/2016  . Hyperlipemia 04/15/2016  . Hyperlipidemia   . Hypertension   . Iron deficiency anemia hematology/ oncology--- dr Bobby Rumpf at [facility] in [place]--- per lov note 08-07-2017  stabilized and felt to be more chronic anemia   01/ 2018  dx iron def. anemia  s/p  IV Iron infusion and taking oral iron supplement  . Lactic acidosis 06/04/2013  . Lumbar compression fracture ([place])    01-22-2019 per imaging L1  (06-01-2019 per pt currently no pain)  . Midline cystocele 04/15/2016  . NSTEMI, initial episode of care ([place]) 06/07/2013  . Occlusion of ureteral stent ([place])   . PAF (paroxysmal atrial fibrillation) ([place]) DX JUNE 2012   CARDIOLOGIST--  DR Vernie Shanks--- [facility])  CHADS2  . Palpitations 04/15/2016  . Paroxysmal atrial fibrillation ([place]) 08/04/2017  . Presence of pessary   . Pyelonephritis 06/04/2013  . Right wrist fracture    per pt fractured on 03-10-2019,  had closed reduction and wearing a brace  . Sepsis ([place]) 05/07/2016  . Septic shock(785.52) 06/04/2013  . Tendonitis, Achilles 04/15/2016  . Ureteral obstruction, right 06/04/2013  . Ureteral stricture, right urologist-- dr Tresa Moore   Chronic--- treated with ureteral stent  . Urinary tract infection without hematuria   . Uses walker    and wheelchair for  longer distance  . Wears glasses     Past Surgical History:  Procedure Laterality Date  . APPENDECTOMY    . CARDIOVERSION  09/ 2018   dr Agustin Cree   successful ([facility] )  . CHOLECYSTECTOMY  2010  . COLONOSCOPY W/ POLYPECTOMY  05/2017  . CYSTO/  BALLOON DILATION RIGHT URETERAL STRICTURE/ STENT PLACEMENT  06-02-2013  . CYSTO/  BILATERAL RETROGRADE PYELOGRAM/ RIGHT URETEROSCOPY AND STENT PLACEMENT  10-04-2005  . CYSTO/ LEFT URETEROSCOPIC STONE EXTRACTION  04-03-2006  . CYSTOSCOPY W/ RETROGRADES Right 01/03/2019   Procedure: CYSTOSCOPY WITH RETROGRADE PYELOGRAM RIGHT WITH STENT EXCHANGE;  Surgeon: Irine Seal, MD;  [facility];  Service: Urology;  Laterality: Right;  . CYSTOSCOPY W/ URETERAL STENT PLACEMENT Right 02/06/2014   Procedure: CYSTOSCOPY WITH RIGHT RETROGRADE PYELOGRAM  RIGHT STENT REMOVAL and REPLACEMENT, Right Ureteroscopy;  Surgeon: Ailene Rud, MD;  Location: [facility];  Service: Urology;  Laterality: Right;  . CYSTOSCOPY W/ URETERAL STENT PLACEMENT Right 11/26/2016   Procedure: CYSTOSCOPY WITH RETROGRADE PYELOGRAM/URETERAL STENT REPLACEMENT;  Surgeon: Alexis Frock, MD;  Location: [facility];  Service: Urology;  Laterality: Right;  . CYSTOSCOPY W/ URETERAL STENT PLACEMENT Right 04/24/2017   Procedure: CYSTOSCOPY WITH RETROGRADE PYELOGRAM/URETERAL STENT REPLACEMENT;  Surgeon: Alexis Frock, MD;  Location: [facility];  Service: Urology;  Laterality: Right;  . CYSTOSCOPY W/ URETERAL STENT PLACEMENT Right 10/07/2017   Procedure: CYSTOSCOPY WITH RETROGRADE PYELOGRAM/URETERAL STENT EXCHANGE;  Surgeon: Alexis Frock, MD;  Location: [facility];  Service: Urology;  Laterality: Right;  . CYSTOSCOPY W/ URETERAL STENT PLACEMENT Right 03/03/2018   Procedure: CYSTOSCOPY WITH RIGHT RETROGRADE / RIGHT URETERAL STENT EXCHANGE;  Surgeon: Alexis Frock, MD;  [facility];  Service: Urology;  Laterality: Right;  .  CYSTOSCOPY W/ URETERAL STENT PLACEMENT Right 09/22/2018   Procedure: CYSTOSCOPY WITH RETROGRADE PYELOGRAM/URETERAL STENT PLACEMENT;  Surgeon: Alexis Frock, MD;  Location: [facility];  Service: Urology;  Laterality: Right;  45 MINS  . CYSTOSCOPY W/ URETERAL STENT PLACEMENT Right 06/03/2019   Procedure: CYSTOSCOPY WITH RETROGRADE PYELOGRAM/URETERAL STENT REPLACEMENT;  Surgeon: Alexis Frock, MD;  Location: [facility];  Service: Urology;  Laterality: Right;  . CYSTOSCOPY W/ URETERAL STENT PLACEMENT Right 11/23/2019   Procedure: CYSTOSCOPY WITH RETROGRADE PYELOGRAM/URETERAL STENT PLACEMENT;  Surgeon: Alexis Frock, MD;  Location: [facility];  Service: Urology;  Laterality: Right;  45 MINS  . CYSTOSCOPY WITH LITHOLAPAXY N/A 11/21/2013   Procedure: CYSTOSCOPY WITH LITHOLAPAXY;  Surgeon: Ailene Rud, MD;  [facility];  Service: Urology;  Laterality: N/A;  . CYSTOSCOPY WITH RETROGRADE PYELOGRAM, URETEROSCOPY AND STENT PLACEMENT Bilateral 03/15/2014   Procedure: CYSTOSCOPY WITH BILATERAL RETROGRADE PYELOGRAM, RIGHT DIAGNOSTIC URETEROSCOPY AND STENT EXCHANGE;  Surgeon: Alexis Frock, MD;  [facility];  Service: Urology;  Laterality: Bilateral;  . CYSTOSCOPY WITH STENT PLACEMENT Right 05/07/2016   Procedure: CYSTOSCOPY WITH RETROGRADE PYELOGRAM, URETERAL STENT PLACEMENT;  Surgeon: Franchot Gallo, MD;  [facility];  Service: Urology;  Laterality: Right;  . EYE SURGERY     Bilateral cataract removal  . HEMIARTHROPLASTY HIP Right 11/2016   fractured  . IR CATHETER TUBE CHANGE  02/04/2019  . IR CATHETER TUBE CHANGE  02/04/2019  . IR CATHETER TUBE CHANGE  02/11/2019  . IR RADIOLOGIST EVAL & MGMT  03/09/2019  . JOINT REPLACEMENT    . LUMBAR FUSION  JULY 2013  . NEPHROLITHOTOMY  X2 YRS AGO  . PERCUTANEOUS NEPHROSTOMY  12/2018  . TOTAL ABDOMINAL HYSTERECTOMY W/ BILATERAL SALPINGOOPHORECTOMY  1989  . TOTAL KNEE  ARTHROPLASTY Bilateral 1992  &  1996  . TRANSTHORACIC ECHOCARDIOGRAM  07-24-2017   dr Agustin Cree   ef 60-65% (improved from last echo 2014, was 45-50%)/  trace TR/  mild AV sclerosis without stenosis  . URETEROSCOPY Right 11/21/2013   Procedure: URETEROSCOPY WITH STENT REMOVAL AND REPLACEMENT;  Surgeon: Ailene Rud, MD;  Location: [facility];  Service: Urology;  Laterality: Right;    MEDICATIONS: . acetaminophen (TYLENOL) 500 MG tablet  . amitriptyline (ELAVIL) 25 MG tablet  . carvedilol (COREG) 6.25 MG tablet  . diltiazem (CARDIZEM CD) 120 MG 24 hr capsule  . diltiazem (TIAZAC) 120 MG 24 hr capsule  . ELIQUIS 5 MG TABS tablet  . flecainide (TAMBOCOR)  50 MG tablet  . hydroxyurea (HYDREA) 500 MG capsule  . Meth-Hyo-M Bl-Na Phos-Ph Sal (URO-MP PO)  . omeprazole (PRILOSEC) 40 MG capsule  . ondansetron (ZOFRAN-ODT) 4 MG disintegrating tablet   No current facility-administered medications for this encounter.    Maia Plan [facility] Pre-Surgical Testing [phone] 03/20/20  3:44 PM

## 2020-03-20 NOTE — Anesthesia Preprocedure Evaluation (Addendum)
Anesthesia Evaluation  Patient identified by MRN, date of birth, ID band Patient awake    Reviewed: Allergy & Precautions, NPO status , Patient's Chart, lab work & pertinent test results  History of Anesthesia Complications (+) DIFFICULT AIRWAY and history of anesthetic complications (h/o difficult intubation for sepsis in [place] ED in 2014; intubation note for cysto in 2017 shows 1 attempt with glidescope; several procedures done successfully with LMA)  Airway Mallampati: III  TM Distance: <3 FB Neck ROM: Full    Dental  (+) Teeth Intact   Pulmonary neg pulmonary ROS,    Pulmonary exam normal        Cardiovascular hypertension, Pt. on medications and Pt. on home beta blockers + CAD and +CHF (2014 EF 45-50%, now normal)  Normal cardiovascular exam+ dysrhythmias Atrial Fibrillation      Neuro/Psych negative neurological ROS  negative psych ROS   GI/Hepatic Neg liver ROS, hiatal hernia, GERD  ,  Endo/Other  negative endocrine ROS  Renal/GU Renal InsufficiencyRenal diseaseUreteral stricture  negative genitourinary   Musculoskeletal negative musculoskeletal ROS (+)   Abdominal   Peds  Hematology  (+) Blood dyscrasia (Eliquis), ,   Anesthesia Other Findings  Per cardiac note 02/21/19, "Patient has a history of paroxysmal atrial fibrillation on Flecainide and Eliquis, diastolic CHF, and hypertension. She was recently seen by Dr. .Agustin Cree on 12/29/2019 and was doing well form a cardiac standpoint.Recently cleared for colonoscopy. Echo 02/01/20 with improved LVEF. Given past medical history and time since last visit, based on ACC/AHA guidelines,[name] Liliane Channel Hemrickwould be at acceptable risk for the planned procedure without further cardiovascular testing."  Echo 02/01/20: EF 60-65%, gr1dd, normal RV function, mild dilatation of the ascending aorta measuring 38 mm, normal valves  Reproductive/Obstetrics                           Anesthesia Physical Anesthesia Plan  ASA: III  Anesthesia Plan: General   Post-op Pain Management:    Induction: Intravenous  PONV Risk Score and Plan: 3 and Ondansetron, Dexamethasone, Treatment may vary due to age or medical condition and Midazolam  Airway Management Planned: Oral ETT  Additional Equipment: None  Intra-op Plan:   Post-operative Plan: Extubation in OR  Informed Consent: I have reviewed the patients History and Physical, chart, labs and discussed the procedure including the risks, benefits and alternatives for the proposed anesthesia with the patient or authorized representative who has indicated his/her understanding and acceptance.     Dental advisory given  Plan Discussed with:   Anesthesia Plan Comments: (See PAT note 03/19/2020, Konrad Felix, PA-C)      Anesthesia Quick Evaluation

## 2020-03-23 ENCOUNTER — Inpatient Hospital Stay (HOSPITAL_COMMUNITY): Payer: Medicare Other | Admitting: Physician Assistant

## 2020-03-23 ENCOUNTER — Other Ambulatory Visit: Payer: Self-pay

## 2020-03-23 ENCOUNTER — Encounter (HOSPITAL_COMMUNITY)
Admission: RE | Disposition: A | Discharge status: Home / Self Care | Payer: Self-pay | Source: Home / Self Care | Attending: Urology

## 2020-03-23 ENCOUNTER — Encounter (HOSPITAL_COMMUNITY): Payer: Self-pay | Admitting: Urology

## 2020-03-23 ENCOUNTER — Inpatient Hospital Stay (HOSPITAL_COMMUNITY): Payer: Medicare Other | Admitting: Anesthesiology

## 2020-03-23 ENCOUNTER — Inpatient Hospital Stay (HOSPITAL_COMMUNITY)
Admission: RE | Admit: 2020-03-23 | Discharge: 2020-03-25 | DRG: 660 | Disposition: A | Discharge status: Home / Self Care | Payer: Medicare Other | Attending: Urology | Admitting: Urology

## 2020-03-23 DIAGNOSIS — Z809 Family history of malignant neoplasm, unspecified: Secondary | ICD-10-CM

## 2020-03-23 DIAGNOSIS — I5032 Chronic diastolic (congestive) heart failure: Secondary | ICD-10-CM | POA: Diagnosis present

## 2020-03-23 DIAGNOSIS — Z20822 Contact with and (suspected) exposure to covid-19: Secondary | ICD-10-CM | POA: Diagnosis present

## 2020-03-23 DIAGNOSIS — K219 Gastro-esophageal reflux disease without esophagitis: Secondary | ICD-10-CM | POA: Diagnosis present

## 2020-03-23 DIAGNOSIS — N289 Disorder of kidney and ureter, unspecified: Secondary | ICD-10-CM

## 2020-03-23 DIAGNOSIS — Z885 Allergy status to narcotic agent status: Secondary | ICD-10-CM

## 2020-03-23 DIAGNOSIS — I110 Hypertensive heart disease with heart failure: Secondary | ICD-10-CM | POA: Diagnosis present

## 2020-03-23 DIAGNOSIS — I480 Paroxysmal atrial fibrillation: Secondary | ICD-10-CM | POA: Diagnosis present

## 2020-03-23 DIAGNOSIS — E785 Hyperlipidemia, unspecified: Secondary | ICD-10-CM | POA: Diagnosis present

## 2020-03-23 DIAGNOSIS — N269 Renal sclerosis, unspecified: Secondary | ICD-10-CM | POA: Diagnosis not present

## 2020-03-23 DIAGNOSIS — Z7901 Long term (current) use of anticoagulants: Secondary | ICD-10-CM

## 2020-03-23 DIAGNOSIS — I252 Old myocardial infarction: Secondary | ICD-10-CM | POA: Diagnosis not present

## 2020-03-23 DIAGNOSIS — Z8249 Family history of ischemic heart disease and other diseases of the circulatory system: Secondary | ICD-10-CM | POA: Diagnosis not present

## 2020-03-23 DIAGNOSIS — Z96653 Presence of artificial knee joint, bilateral: Secondary | ICD-10-CM | POA: Diagnosis present

## 2020-03-23 DIAGNOSIS — Z881 Allergy status to other antibiotic agents status: Secondary | ICD-10-CM | POA: Diagnosis not present

## 2020-03-23 DIAGNOSIS — N261 Atrophy of kidney (terminal): Secondary | ICD-10-CM | POA: Diagnosis not present

## 2020-03-23 DIAGNOSIS — I2510 Atherosclerotic heart disease of native coronary artery without angina pectoris: Secondary | ICD-10-CM | POA: Diagnosis present

## 2020-03-23 DIAGNOSIS — Z8542 Personal history of malignant neoplasm of other parts of uterus: Secondary | ICD-10-CM

## 2020-03-23 DIAGNOSIS — K449 Diaphragmatic hernia without obstruction or gangrene: Secondary | ICD-10-CM | POA: Diagnosis present

## 2020-03-23 DIAGNOSIS — N111 Chronic obstructive pyelonephritis: Secondary | ICD-10-CM | POA: Diagnosis present

## 2020-03-23 HISTORY — DX: Disorder of kidney and ureter, unspecified: N28.9

## 2020-03-23 HISTORY — PX: ROBOT ASSISTED LAPAROSCOPIC NEPHRECTOMY: SHX5140

## 2020-03-23 LAB — SAMPLE TO BLOOD BANK

## 2020-03-23 LAB — HEMOGLOBIN AND HEMATOCRIT, BLOOD
HCT: 47.7 % — ABNORMAL HIGH (ref 36.0–46.0)
Hemoglobin: 14.7 g/dL (ref 12.0–15.0)

## 2020-03-23 SURGERY — NEPHRECTOMY, RADICAL, ROBOT-ASSISTED, LAPAROSCOPIC, ADULT
Anesthesia: General | Laterality: Right

## 2020-03-23 MED ORDER — HYDROXYUREA 500 MG PO CAPS: 500.0000 mg | ORAL_CAPSULE | ORAL | Status: DC

## 2020-03-23 MED ORDER — LIDOCAINE 2% (20 MG/ML) 5 ML SYRINGE
INTRAMUSCULAR | Status: AC
  Filled 2020-03-23: qty 5

## 2020-03-23 MED ORDER — ROCURONIUM BROMIDE 10 MG/ML (PF) SYRINGE
PREFILLED_SYRINGE | INTRAVENOUS | Status: DC | PRN
  Administered 2020-03-23: 70 mg via INTRAVENOUS
  Administered 2020-03-23 (×4): 20 mg via INTRAVENOUS
  Administered 2020-03-23 (×2): 10 mg via INTRAVENOUS

## 2020-03-23 MED ORDER — DEXAMETHASONE SODIUM PHOSPHATE 10 MG/ML IJ SOLN
INTRAMUSCULAR | Status: AC
  Filled 2020-03-23: qty 1

## 2020-03-23 MED ORDER — ROCURONIUM BROMIDE 10 MG/ML (PF) SYRINGE
PREFILLED_SYRINGE | INTRAVENOUS | Status: AC
  Filled 2020-03-23: qty 10

## 2020-03-23 MED ORDER — CARVEDILOL 3.125 MG PO TABS
3.1250 mg | ORAL_TABLET | Freq: Two times a day (BID) | ORAL | Status: DC
  Administered 2020-03-23 – 2020-03-25 (×4): 3.125 mg via ORAL
  Filled 2020-03-23 (×4): qty 1

## 2020-03-23 MED ORDER — LACTATED RINGERS IV SOLN: INTRAVENOUS | Status: DC

## 2020-03-23 MED ORDER — PHENYLEPHRINE HCL-NACL 10-0.9 MG/250ML-% IV SOLN
INTRAVENOUS | Status: DC | PRN
  Administered 2020-03-23: 20 ug/min via INTRAVENOUS

## 2020-03-23 MED ORDER — FENTANYL CITRATE (PF) 100 MCG/2ML IJ SOLN
INTRAMUSCULAR | Status: AC
  Administered 2020-03-23: 50 ug via INTRAVENOUS
  Filled 2020-03-23: qty 2

## 2020-03-23 MED ORDER — OXYCODONE HCL 5 MG/5ML PO SOLN: 5.0000 mg | Freq: Once | ORAL | Status: DC | PRN

## 2020-03-23 MED ORDER — SODIUM CHLORIDE 0.9 % IV SOLN
2.0000 g | INTRAVENOUS | Status: AC
  Administered 2020-03-23: 2 g via INTRAVENOUS
  Filled 2020-03-23: qty 20

## 2020-03-23 MED ORDER — PROPOFOL 10 MG/ML IV BOLUS
INTRAVENOUS | Status: AC
  Filled 2020-03-23: qty 20

## 2020-03-23 MED ORDER — FENTANYL CITRATE (PF) 100 MCG/2ML IJ SOLN
INTRAMUSCULAR | Status: DC | PRN
  Administered 2020-03-23 (×2): 50 ug via INTRAVENOUS
  Administered 2020-03-23: 100 ug via INTRAVENOUS
  Administered 2020-03-23: 50 ug via INTRAVENOUS

## 2020-03-23 MED ORDER — LACTATED RINGERS IV SOLN
INTRAVENOUS | Status: DC | PRN
Start: 2020-03-23 — End: 2020-03-23

## 2020-03-23 MED ORDER — CHLORHEXIDINE GLUCONATE 0.12 % MT SOLN
15.0000 mL | Freq: Once | OROMUCOSAL | Status: AC
  Administered 2020-03-23: 15 mL via OROMUCOSAL
  Filled 2020-03-23: qty 15

## 2020-03-23 MED ORDER — FENTANYL CITRATE (PF) 250 MCG/5ML IJ SOLN
INTRAMUSCULAR | Status: AC
  Filled 2020-03-23: qty 5

## 2020-03-23 MED ORDER — DIPHENHYDRAMINE HCL 12.5 MG/5ML PO ELIX: 12.5000 mg | ORAL_SOLUTION | Freq: Four times a day (QID) | ORAL | Status: DC | PRN

## 2020-03-23 MED ORDER — AMITRIPTYLINE HCL 25 MG PO TABS
25.0000 mg | ORAL_TABLET | Freq: Every day | ORAL | Status: DC
  Administered 2020-03-23 – 2020-03-24 (×2): 25 mg via ORAL
  Filled 2020-03-23 (×2): qty 1

## 2020-03-23 MED ORDER — ONDANSETRON HCL 4 MG/2ML IJ SOLN
INTRAMUSCULAR | Status: AC
  Filled 2020-03-23: qty 2

## 2020-03-23 MED ORDER — LACTATED RINGERS IV SOLN: INTRAVENOUS | Status: DC | PRN

## 2020-03-23 MED ORDER — MIDAZOLAM HCL 5 MG/5ML IJ SOLN
INTRAMUSCULAR | Status: DC | PRN
  Administered 2020-03-23: 1 mg via INTRAVENOUS

## 2020-03-23 MED ORDER — AMISULPRIDE (ANTIEMETIC) 5 MG/2ML IV SOLN
10.0000 mg | Freq: Once | INTRAVENOUS | Status: AC
  Administered 2020-03-23: 10 mg via INTRAVENOUS

## 2020-03-23 MED ORDER — STERILE WATER FOR IRRIGATION IR SOLN
Status: DC | PRN
  Administered 2020-03-23: 1000 mL

## 2020-03-23 MED ORDER — AMISULPRIDE (ANTIEMETIC) 5 MG/2ML IV SOLN
INTRAVENOUS | Status: AC
  Filled 2020-03-23: qty 2

## 2020-03-23 MED ORDER — PROPOFOL 10 MG/ML IV BOLUS
INTRAVENOUS | Status: DC | PRN
  Administered 2020-03-23: 120 mg via INTRAVENOUS

## 2020-03-23 MED ORDER — ONDANSETRON HCL 4 MG/2ML IJ SOLN
INTRAMUSCULAR | Status: DC | PRN
  Administered 2020-03-23: 4 mg via INTRAVENOUS

## 2020-03-23 MED ORDER — BUPIVACAINE LIPOSOME 1.3 % IJ SUSP
20.0000 mL | Freq: Once | INTRAMUSCULAR | Status: AC
  Administered 2020-03-23: 20 mL
  Filled 2020-03-23: qty 20

## 2020-03-23 MED ORDER — ONDANSETRON HCL 4 MG/2ML IJ SOLN: 4.0000 mg | INTRAMUSCULAR | Status: DC | PRN

## 2020-03-23 MED ORDER — DEXAMETHASONE SODIUM PHOSPHATE 10 MG/ML IJ SOLN
INTRAMUSCULAR | Status: DC | PRN
  Administered 2020-03-23: 4 mg via INTRAVENOUS

## 2020-03-23 MED ORDER — TRAMADOL HCL 50 MG PO TABS: 50.0000 mg | ORAL_TABLET | Freq: Four times a day (QID) | ORAL | 0 refills | Status: DC | PRN

## 2020-03-23 MED ORDER — OXYCODONE HCL 5 MG PO TABS: 5.0000 mg | ORAL_TABLET | Freq: Once | ORAL | Status: DC | PRN

## 2020-03-23 MED ORDER — SUGAMMADEX SODIUM 200 MG/2ML IV SOLN
INTRAVENOUS | Status: DC | PRN
  Administered 2020-03-23: 200 mg via INTRAVENOUS

## 2020-03-23 MED ORDER — HYDROMORPHONE HCL 1 MG/ML IJ SOLN: 0.5000 mg | INTRAMUSCULAR | Status: DC | PRN

## 2020-03-23 MED ORDER — SUGAMMADEX SODIUM 200 MG/2ML IV SOLN
200.0000 mg | Freq: Once | INTRAVENOUS | Status: AC
  Administered 2020-03-23: 200 mg via INTRAVENOUS

## 2020-03-23 MED ORDER — SODIUM CHLORIDE (PF) 0.9 % IJ SOLN
INTRAMUSCULAR | Status: AC
  Filled 2020-03-23: qty 20

## 2020-03-23 MED ORDER — ACETAMINOPHEN 10 MG/ML IV SOLN
1000.0000 mg | Freq: Four times a day (QID) | INTRAVENOUS | Status: AC
  Administered 2020-03-23 – 2020-03-24 (×3): 1000 mg via INTRAVENOUS
  Filled 2020-03-23 (×3): qty 100

## 2020-03-23 MED ORDER — ACETAMINOPHEN 10 MG/ML IV SOLN
INTRAVENOUS | Status: AC
  Administered 2020-03-23: 1000 mg via INTRAVENOUS
  Filled 2020-03-23: qty 100

## 2020-03-23 MED ORDER — DIPHENHYDRAMINE HCL 50 MG/ML IJ SOLN: 12.5000 mg | Freq: Four times a day (QID) | INTRAMUSCULAR | Status: DC | PRN

## 2020-03-23 MED ORDER — ORAL CARE MOUTH RINSE: 15.0000 mL | Freq: Once | OROMUCOSAL | Status: AC

## 2020-03-23 MED ORDER — FENTANYL CITRATE (PF) 100 MCG/2ML IJ SOLN
INTRAMUSCULAR | Status: AC
  Administered 2020-03-23: 25 ug via INTRAVENOUS
  Filled 2020-03-23: qty 2

## 2020-03-23 MED ORDER — PANTOPRAZOLE SODIUM 40 MG PO TBEC
80.0000 mg | DELAYED_RELEASE_TABLET | Freq: Every day | ORAL | Status: DC
  Administered 2020-03-24 – 2020-03-25 (×2): 80 mg via ORAL
  Filled 2020-03-23 (×2): qty 2

## 2020-03-23 MED ORDER — FENTANYL CITRATE (PF) 100 MCG/2ML IJ SOLN
25.0000 ug | INTRAMUSCULAR | Status: DC | PRN
  Administered 2020-03-23 (×2): 25 ug via INTRAVENOUS

## 2020-03-23 MED ORDER — PHENYLEPHRINE 40 MCG/ML (10ML) SYRINGE FOR IV PUSH (FOR BLOOD PRESSURE SUPPORT)
PREFILLED_SYRINGE | INTRAVENOUS | Status: AC
  Filled 2020-03-23: qty 10

## 2020-03-23 MED ORDER — PHENYLEPHRINE HCL-NACL 10-0.9 MG/250ML-% IV SOLN
INTRAVENOUS | Status: AC
  Filled 2020-03-23: qty 500

## 2020-03-23 MED ORDER — DEXTROSE-NACL 5-0.45 % IV SOLN: INTRAVENOUS | Status: DC

## 2020-03-23 MED ORDER — DOCUSATE SODIUM 100 MG PO CAPS
100.0000 mg | ORAL_CAPSULE | Freq: Two times a day (BID) | ORAL | Status: DC
  Administered 2020-03-23: 100 mg via ORAL
  Filled 2020-03-23 (×2): qty 1

## 2020-03-23 MED ORDER — MIDAZOLAM HCL 2 MG/2ML IJ SOLN
INTRAMUSCULAR | Status: AC
  Filled 2020-03-23: qty 2

## 2020-03-23 MED ORDER — BELLADONNA ALKALOIDS-OPIUM 16.2-60 MG RE SUPP: 1.0000 | Freq: Four times a day (QID) | RECTAL | Status: DC | PRN

## 2020-03-23 MED ORDER — SODIUM CHLORIDE (PF) 0.9 % IJ SOLN
INTRAMUSCULAR | Status: DC | PRN
  Administered 2020-03-23: 20 mL

## 2020-03-23 MED ORDER — SODIUM CHLORIDE 0.9 % IV SOLN
1.0000 g | Freq: Once | INTRAVENOUS | Status: AC
  Administered 2020-03-24: 1 g via INTRAVENOUS
  Filled 2020-03-23: qty 1

## 2020-03-23 MED ORDER — CHLORHEXIDINE GLUCONATE CLOTH 2 % EX PADS: 6.0000 | MEDICATED_PAD | Freq: Every day | CUTANEOUS | Status: DC

## 2020-03-23 MED ORDER — PHENYLEPHRINE 40 MCG/ML (10ML) SYRINGE FOR IV PUSH (FOR BLOOD PRESSURE SUPPORT)
PREFILLED_SYRINGE | INTRAVENOUS | Status: DC | PRN
  Administered 2020-03-23 (×2): 80 ug via INTRAVENOUS

## 2020-03-23 MED ORDER — ONDANSETRON HCL 4 MG/2ML IJ SOLN
4.0000 mg | Freq: Once | INTRAMUSCULAR | Status: AC | PRN
  Administered 2020-03-23: 4 mg via INTRAVENOUS

## 2020-03-23 MED ORDER — DILTIAZEM HCL ER BEADS 120 MG PO CP24
120.0000 mg | ORAL_CAPSULE | Freq: Every day | ORAL | Status: DC
  Administered 2020-03-24 – 2020-03-25 (×2): 120 mg via ORAL
  Filled 2020-03-23 (×4): qty 1

## 2020-03-23 MED ORDER — FLECAINIDE ACETATE 50 MG PO TABS
75.0000 mg | ORAL_TABLET | Freq: Two times a day (BID) | ORAL | Status: DC
  Administered 2020-03-23 – 2020-03-25 (×4): 75 mg via ORAL
  Filled 2020-03-23 (×4): qty 2

## 2020-03-23 MED ORDER — LACTATED RINGERS IR SOLN
Status: DC | PRN
  Administered 2020-03-23: 1000 mL

## 2020-03-23 MED ORDER — LIDOCAINE 2% (20 MG/ML) 5 ML SYRINGE
INTRAMUSCULAR | Status: DC | PRN
  Administered 2020-03-23: 60 mg via INTRAVENOUS

## 2020-03-23 MED ORDER — KETOROLAC TROMETHAMINE 15 MG/ML IJ SOLN
INTRAMUSCULAR | Status: AC
  Filled 2020-03-23: qty 1

## 2020-03-23 MED ORDER — TRAMADOL HCL 50 MG PO TABS: 50.0000 mg | ORAL_TABLET | Freq: Four times a day (QID) | ORAL | Status: DC | PRN

## 2020-03-23 SURGICAL SUPPLY — 58 items
BAG LAPAROSCOPIC 12 15 PORT 16 (BASKET) ×1 IMPLANT
BAG RETRIEVAL 12/15 (BASKET) ×2
CHLORAPREP W/TINT 26 (MISCELLANEOUS) ×2 IMPLANT
CLIP VESOLOCK LG 6/CT PURPLE (CLIP) ×2 IMPLANT
CLIP VESOLOCK MED LG 6/CT (CLIP) ×2 IMPLANT
CLIP VESOLOCK XL 6/CT (CLIP) ×4 IMPLANT
COVER SURGICAL LIGHT HANDLE (MISCELLANEOUS) ×2 IMPLANT
COVER TIP SHEARS 8 DVNC (MISCELLANEOUS) ×1 IMPLANT
COVER TIP SHEARS 8MM DA VINCI (MISCELLANEOUS) ×2
COVER WAND RF STERILE (DRAPES) ×2 IMPLANT
CUTTER ECHEON FLEX ENDO 45 340 (ENDOMECHANICALS) ×2 IMPLANT
DECANTER SPIKE VIAL GLASS SM (MISCELLANEOUS) ×2 IMPLANT
DERMABOND ADVANCED (GAUZE/BANDAGES/DRESSINGS) ×1
DERMABOND ADVANCED .7 DNX12 (GAUZE/BANDAGES/DRESSINGS) ×1 IMPLANT
DRAIN CHANNEL 15F RND FF 3/16 (WOUND CARE) IMPLANT
DRAPE ARM DVNC X/XI (DISPOSABLE) ×4 IMPLANT
DRAPE COLUMN DVNC XI (DISPOSABLE) ×1 IMPLANT
DRAPE DA VINCI XI ARM (DISPOSABLE) ×8
DRAPE DA VINCI XI COLUMN (DISPOSABLE) ×2
DRAPE INCISE IOBAN 66X45 STRL (DRAPES) ×2 IMPLANT
DRAPE SHEET LG 3/4 BI-LAMINATE (DRAPES) ×2 IMPLANT
ELECT PENCIL ROCKER SW 15FT (MISCELLANEOUS) ×2 IMPLANT
ELECT REM PT RETURN 15FT ADLT (MISCELLANEOUS) ×2 IMPLANT
EVACUATOR SILICONE 100CC (DRAIN) IMPLANT
GLOVE BIO SURGEON STRL SZ 6.5 (GLOVE) ×2 IMPLANT
GLOVE BIOGEL M STRL SZ7.5 (GLOVE) ×4 IMPLANT
GOWN STRL REUS W/TWL LRG LVL3 (GOWN DISPOSABLE) ×6 IMPLANT
IRRIG SUCT STRYKERFLOW 2 WTIP (MISCELLANEOUS) ×2
IRRIGATION SUCT STRKRFLW 2 WTP (MISCELLANEOUS) ×1 IMPLANT
KIT BASIN (CUSTOM PROCEDURE TRAY) ×2 IMPLANT
KIT TURNOVER KIT A (KITS) IMPLANT
LOOP VESSEL MAXI BLUE (MISCELLANEOUS) IMPLANT
NEEDLE INSUFFLATION 14GA 120MM (NEEDLE) ×2 IMPLANT
PENCIL SMOKE EVACUATOR (MISCELLANEOUS) IMPLANT
PORT ACCESS TROCAR AIRSEAL 12 (TROCAR) ×1 IMPLANT
PORT ACCESS TROCAR AIRSEAL 5M (TROCAR) ×1
PROTECTOR NERVE ULNAR (MISCELLANEOUS) ×4 IMPLANT
SEAL CANN UNIV 5-8 DVNC XI (MISCELLANEOUS) ×4 IMPLANT
SEAL XI 5MM-8MM UNIVERSAL (MISCELLANEOUS) ×8
SET TRI-LUMEN FLTR TB AIRSEAL (TUBING) ×2 IMPLANT
SOLUTION ELECTROLUBE (MISCELLANEOUS) ×2 IMPLANT
SPONGE LAP 4X18 RFD (DISPOSABLE) ×2 IMPLANT
STAPLE RELOAD 45 WHT (STAPLE) ×6 IMPLANT
STAPLE RELOAD 45MM WHITE (STAPLE) ×12
SUT ETHILON 3 0 PS 1 (SUTURE) IMPLANT
SUT MNCRL AB 4-0 PS2 18 (SUTURE) ×4 IMPLANT
SUT NOVA NAB GS-21 0 18 T12 DT (SUTURE) ×4 IMPLANT
SUT PDS AB 1 CT1 27 (SUTURE) ×6 IMPLANT
SUT VICRYL 0 UR6 27IN ABS (SUTURE) ×2 IMPLANT
TOWEL OR 17X26 10 PK STRL BLUE (TOWEL DISPOSABLE) ×2 IMPLANT
TOWEL OR NON WOVEN STRL DISP B (DISPOSABLE) ×2 IMPLANT
TRAY FOLEY MTR SLVR 14FR STAT (SET/KITS/TRAYS/PACK) ×2 IMPLANT
TRAY FOLEY MTR SLVR 16FR STAT (SET/KITS/TRAYS/PACK) IMPLANT
TRAY LAPAROSCOPIC (CUSTOM PROCEDURE TRAY) ×2 IMPLANT
TROCAR BLADELESS OPT 5 100 (ENDOMECHANICALS) ×2 IMPLANT
TROCAR UNIVERSAL OPT 12M 100M (ENDOMECHANICALS) ×2 IMPLANT
TROCAR XCEL 12X100 BLDLESS (ENDOMECHANICALS) ×2 IMPLANT
WATER STERILE IRR 1000ML POUR (IV SOLUTION) ×2 IMPLANT

## 2020-03-23 NOTE — Discharge Instructions (Signed)
1.  Activity:  You are encouraged to ambulate frequently (about every hour during waking hours) to help prevent blood clots from forming in your legs or lungs.  However, you should not engage in any heavy lifting (> 10-15 lbs), strenuous activity, or straining. 2. Diet: You should advance your diet as instructed by your physician.  It will be normal to have some bloating, nausea, and abdominal discomfort intermittently. 3. Prescriptions:  You will be provided a prescription for pain medication to take as needed.  If your pain is not severe enough to require the prescription pain medication, you may take extra strength Tylenol instead which will have less side effects.  You should also take a prescribed stool softener to avoid straining with bowel movements as the prescription pain medication may constipate you. 4. Incisions: You may remove your dressing bandages 48 hours after surgery if not removed in the hospital.  You will either have some small staples or special tissue glue at each of the incision sites. Once the bandages are removed (if present), the incisions may stay open to air.  You may start showering (but not soaking or bathing in water) the 2nd day after surgery and the incisions simply need to be patted dry after the shower.  No additional care is needed. 5. What to call us about: You should call the office [phone]) if you develop fever > 101 or develop persistent vomiting.   You may resume Eliquis 5 days after surgery and aspiriin, advil, aleve, vitamins, and supplements 7 days after surgery.

## 2020-03-23 NOTE — H&P (Signed)
[name] [name] is an 79 y.o. female.    Chief Complaint: PRe-op RIGHT Robotic nephrectomy  HPI:   1 - Rt Chronic Hydronephrosis / Partial Ureteral Stricture- Multiple prior stone procedures on right including SWL, ureteroscopy, and neph tube.   Recent Course:  10/2019 - Exchange Rt 8x24 polaris stent.    2 - Recurrent Urinary Tract Infection / Right Perinephric Abscess - Several episodes of simple cystitis as well as right pyelo / perinephric abscess 12/2018. Pelvic 02/2014 atrophic vaginitis, reduced prolapse with well-sized pessary. PVR 02/2014 "87mL" UCX 09/2019 e. coli sens keflex, gent, nitro, Res Cipro.    PMH sig for AFib / Thrombocytosis / Eliquus (primary prevention only, cardioversion 2018, follows Dr Bonn[facility] in [place]) , lap chole, hyst, left open ureterolithotomy, ureteroscopy x several, shockwave lithotripsy.    Today [name] is seen to proceed with RIGHT robotic nephrectomy for chornically infected / partially obstructed kidney. C19 screen negative. gb 14.5, Cr 0.89, Plts <500, has cards clearance.    Past Medical History:  Diagnosis Date  . Abdominal aortic atherosclerosis ([place]) 05/07/2016   Noted on CT abd/pelvis  . Acute renal failure ([place]) 06/04/2013  . Acute respiratory failure with hypoxia ([place]) 06/04/2013  . Anemia, unspecified 06/08/2013  . Anticoagulant long-term use    ELIQUIS  . Chronic anticoagulation 08/04/2017  . Chronic diastolic (congestive) heart failure (HCC)    hx of  . Chronic diastolic congestive heart failure ([place]) 05/31/2015  . Chronic kidney disease 2014   arf on dialysis in icu  . Congenital vaginal enterocele 04/15/2016  . Coronary artery disease   . Cystocele, midline    followed by dr c. [facility] in [place])  pt uses pessary  . Diarrhea 06/11/2013  . Diverticulosis 05/07/2016   Noted on CT abd/pelvis  . Dyslipidemia 05/31/2015  . E coli bacteremia 06/08/2013  . Encounter for monitoring flecainide  therapy 07/30/2016  . Female genital prolapse 04/15/2016  . First degree heart block   . General weakness   . GERD (gastroesophageal reflux disease)   . Hematuria    intermittently due to ureter right stent  . Hiatal hernia   . High risk medication use 04/15/2016  . History of arteriovenostomy for renal dialysis ([place])   . History of colon polyps   . History of gastric polyp   . History of kidney infection    12-31-2018  perinephrtic abscess  right side  s/p drains x2 01-10-2019,  05/ 2020 drains removed  . History of kidney stones   . History of peptic ulcer disease   . History of septic shock    2011 (pt unaware) and 06-04-2013  w/ acute renal failure  . History of uterine cancer    STAGE I  --  S/P TAH W/ BSO  (NO OTHER TX)  . History of ventilator dependency ([place]) 2014   in setting of septic shock  . Hydronephrosis 05/07/2016  . Hyperlipemia 04/15/2016  . Hyperlipidemia   . Hypertension   . Iron deficiency anemia hematology/ oncology--- dr Bobby Rumpf at [facility] in [place]--- per lov note 08-07-2017  stabilized and felt to be more chronic anemia   01/ 2018  dx iron def. anemia  s/p  IV Iron infusion and taking oral iron supplement  . Lactic acidosis 06/04/2013  . Lumbar compression fracture ([place])    01-22-2019 per imaging L1  (06-01-2019 per pt currently no pain)  . Midline cystocele 04/15/2016  . NSTEMI, initial episode of  care ([place]) 06/07/2013  . Occlusion of ureteral stent ([place])   . PAF (paroxysmal atrial fibrillation) ([place]) DX JUNE 2012   CARDIOLOGIST--  DR Vernie Shanks--- [place] [facility])  CHADS2  . Palpitations 04/15/2016  . Paroxysmal atrial fibrillation ([place]) 08/04/2017  . Presence of pessary   . Pyelonephritis 06/04/2013  . Right wrist fracture    per pt fractured on 03-10-2019,  had closed reduction and wearing a brace  . Sepsis ([place]) 05/07/2016  . Septic shock(785.52) 06/04/2013  . Tendonitis, Achilles 04/15/2016  . Ureteral obstruction, right  06/04/2013  . Ureteral stricture, right urologist-- dr Tresa Moore   Chronic--- treated with ureteral stent  . Urinary tract infection without hematuria   . Uses walker    and wheelchair for longer distance  . Wears glasses     Past Surgical History:  Procedure Laterality Date  . APPENDECTOMY    . CARDIOVERSION  09/ 2018   dr Agustin Cree   successful ([facility] )  . CHOLECYSTECTOMY  2010  . COLONOSCOPY W/ POLYPECTOMY  05/2017  . CYSTO/  BALLOON DILATION RIGHT URETERAL STRICTURE/ STENT PLACEMENT  06-02-2013  . CYSTO/  BILATERAL RETROGRADE PYELOGRAM/ RIGHT URETEROSCOPY AND STENT PLACEMENT  10-04-2005  . CYSTO/ LEFT URETEROSCOPIC STONE EXTRACTION  04-03-2006  . CYSTOSCOPY W/ RETROGRADES Right 01/03/2019   Procedure: CYSTOSCOPY WITH RETROGRADE PYELOGRAM RIGHT WITH STENT EXCHANGE;  Surgeon: Irine Seal, MD;  Location: [facility];  Service: Urology;  Laterality: Right;  . CYSTOSCOPY W/ URETERAL STENT PLACEMENT Right 02/06/2014   Procedure: CYSTOSCOPY WITH RIGHT RETROGRADE PYELOGRAM RIGHT STENT REMOVAL and REPLACEMENT, Right Ureteroscopy;  Surgeon: Ailene Rud, MD;  Location: [facility];  Service: Urology;  Laterality: Right;  . CYSTOSCOPY W/ URETERAL STENT PLACEMENT Right 11/26/2016   Procedure: CYSTOSCOPY WITH RETROGRADE PYELOGRAM/URETERAL STENT REPLACEMENT;  Surgeon: Alexis Frock, MD;  Location: [facility];  Service: Urology;  Laterality: Right;  . CYSTOSCOPY W/ URETERAL STENT PLACEMENT Right 04/24/2017   Procedure: CYSTOSCOPY WITH RETROGRADE PYELOGRAM/URETERAL STENT REPLACEMENT;  Surgeon: Alexis Frock, MD;  Location: [facility];  Service: Urology;  Laterality: Right;  . CYSTOSCOPY W/ URETERAL STENT PLACEMENT Right 10/07/2017   Procedure: CYSTOSCOPY WITH RETROGRADE PYELOGRAM/URETERAL STENT EXCHANGE;  Surgeon: Alexis Frock, MD;  Location: [facility];  Service: Urology;  Laterality: Right;  . CYSTOSCOPY W/ URETERAL STENT PLACEMENT Right  03/03/2018   Procedure: CYSTOSCOPY WITH RIGHT RETROGRADE / RIGHT URETERAL STENT EXCHANGE;  Surgeon: Alexis Frock, MD;  Location: [facility];  Service: Urology;  Laterality: Right;  . CYSTOSCOPY W/ URETERAL STENT PLACEMENT Right 09/22/2018   Procedure: CYSTOSCOPY WITH RETROGRADE PYELOGRAM/URETERAL STENT PLACEMENT;  Surgeon: Alexis Frock, MD;  Location: [facility];  Service: Urology;  Laterality: Right;  45 MINS  . CYSTOSCOPY W/ URETERAL STENT PLACEMENT Right 06/03/2019   Procedure: CYSTOSCOPY WITH RETROGRADE PYELOGRAM/URETERAL STENT REPLACEMENT;  Surgeon: Alexis Frock, MD;  Location: [facility];  Service: Urology;  Laterality: Right;  . CYSTOSCOPY W/ URETERAL STENT PLACEMENT Right 11/23/2019   Procedure: CYSTOSCOPY WITH RETROGRADE PYELOGRAM/URETERAL STENT PLACEMENT;  Surgeon: Alexis Frock, MD;  Location: [facility];  Service: Urology;  Laterality: Right;  45 MINS  . CYSTOSCOPY WITH LITHOLAPAXY N/A 11/21/2013   Procedure: CYSTOSCOPY WITH LITHOLAPAXY;  Surgeon: Ailene Rud, MD;  Location: [facility];  Service: Urology;  Laterality: N/A;  . CYSTOSCOPY WITH RETROGRADE PYELOGRAM, URETEROSCOPY AND STENT PLACEMENT Bilateral 03/15/2014   Procedure: CYSTOSCOPY WITH BILATERAL RETROGRADE PYELOGRAM, RIGHT DIAGNOSTIC URETEROSCOPY AND  STENT EXCHANGE;  Surgeon: Alexis Frock, MD;  Location: [facility];  Service: Urology;  Laterality: Bilateral;  . CYSTOSCOPY WITH STENT PLACEMENT Right 05/07/2016   Procedure: CYSTOSCOPY WITH RETROGRADE PYELOGRAM, URETERAL STENT PLACEMENT;  Surgeon: Franchot Gallo, MD;  Location: [facility];  Service: Urology;  Laterality: Right;  . EYE SURGERY     Bilateral cataract removal  . HEMIARTHROPLASTY HIP Right 11/2016   fractured  . IR CATHETER TUBE CHANGE  02/04/2019  . IR CATHETER TUBE CHANGE  02/04/2019  . IR CATHETER TUBE CHANGE  02/11/2019  . IR RADIOLOGIST EVAL & MGMT  03/09/2019  . JOINT REPLACEMENT    . LUMBAR  FUSION  JULY 2013  . NEPHROLITHOTOMY  X2 YRS AGO  . PERCUTANEOUS NEPHROSTOMY  12/2018  . TOTAL ABDOMINAL HYSTERECTOMY W/ BILATERAL SALPINGOOPHORECTOMY  1989  . TOTAL KNEE ARTHROPLASTY Bilateral 1992  &  1996  . TRANSTHORACIC ECHOCARDIOGRAM  07-24-2017   dr Agustin Cree   ef 60-65% (improved from last echo 2014, was 45-50%)/  trace TR/  mild AV sclerosis without stenosis  . URETEROSCOPY Right 11/21/2013   Procedure: URETEROSCOPY WITH STENT REMOVAL AND REPLACEMENT;  Surgeon: Ailene Rud, MD;  Location: [facility];  Service: Urology;  Laterality: Right;    Family History  Problem Relation Age of Onset  . Heart disease Mother   . Heart failure Mother   . Cancer Mother   . Heart disease Father   . Heart failure Father    Social History:  reports that she has never smoked. She has never used smokeless tobacco. She reports that she does not drink alcohol or use drugs.  Allergies:  Allergies  Allergen Reactions  . Codeine Nausea Only and Other (See Comments)    hallucinations  . Hydrocodone Nausea Only and Other (See Comments)  . Macrodantin [Nitrofurantoin] Nausea And Vomiting    Medications Prior to Admission  Medication Sig Dispense Refill  . acetaminophen (TYLENOL) 500 MG tablet Take 1,000 mg by mouth every 6 (six) hours as needed for moderate pain or headache.     [name] amitriptyline (ELAVIL) 25 MG tablet Take 25 mg by mouth at bedtime.    . carvedilol (COREG) 6.25 MG tablet TAKE 1/2 TABLET BY MOUTH TWICE DAILY (Patient taking differently: Take 3.125 mg by mouth 2 (two) times daily with a meal. ) 180 tablet 0  . diltiazem (TIAZAC) 120 MG 24 hr capsule Take 120 mg by mouth daily.    [name] ELIQUIS 5 MG TABS tablet TAKE ONE TABLET BY MOUTH TWICE DAILY (Patient taking differently: Take 5 mg by mouth 2 (two) times daily. ) 60 tablet 4  . flecainide (TAMBOCOR) 50 MG tablet Take 1.5 tablets (75 mg total) by mouth 2 (two) times daily. 270 tablet 1  . hydroxyurea (HYDREA) 500  MG capsule Take 500 mg by mouth every Monday, Wednesday, and Friday.     [name] omeprazole (PRILOSEC) 40 MG capsule Take 40 mg by mouth daily.     [name] diltiazem (CARDIZEM CD) 120 MG 24 hr capsule Take 1 capsule (120 mg total) by mouth every morning. (Patient not taking: Reported on 03/06/2020) 90 capsule 3  . Meth-Hyo-M Bl-Na Phos-Ph Sal (URO-MP PO) Take 1 capsule by mouth 2 (two) times daily as needed (pain).     . ondansetron (ZOFRAN-ODT) 4 MG disintegrating tablet Take 4 mg by mouth every 6 (six) hours as needed for nausea/vomiting.      Results for orders placed or performed during the hospital encounter of 03/23/20 (  from the past 48 hour(s))  Sample to Blood Bank     Status: None   Collection Time: 03/23/20  5:36 AM  Result Value Ref Range   Blood Bank Specimen SAMPLE AVAILABLE FOR TESTING    Sample Expiration      03/26/2020,2359 Performed at [facility], [place] [address] [place], [place] [address]    No results found.  Review of Systems  Constitutional: Negative for chills and fever.  All other systems reviewed and are negative.   Blood pressure (!) 142/79, pulse 71, temperature 98.6 F (37 C), temperature source Oral, resp. rate 16, height 5\' 7"  (1.702 m), weight 84.8 kg, SpO2 96 %. Physical Exam  Constitutional: She appears well-developed.  At baselne.   HENT:  Head: Normocephalic.  Cardiovascular: Normal rate.  Respiratory: Effort normal.  GI: Soft.  Stale obesity, no CVAT   Musculoskeletal:        General: Normal range of motion.     Cervical back: Normal range of motion.  Neurological: She is alert.  Skin: Skin is warm.  Psychiatric: She has a normal mood and affect.     Assessment/Plan  Proceed as planned with RIGHT robotic neprhectomy. Risks, benefits, alternatives, expected peri-op course discussed many times and reiterated today.   Alexis Frock, MD 03/23/2020, 7:06 AM

## 2020-03-23 NOTE — Brief Op Note (Signed)
03/23/2020  10:19 AM  PATIENT:  [name] [name]  79 y.o. female  PRE-OPERATIVE DIAGNOSIS:  RIGHT  URETERAL STRICTURE, CHRONIC INFECTIONS  POST-OPERATIVE DIAGNOSIS:  RIGHT  URETERAL STRICTURE  PROCEDURE:  Procedure(s): XI ROBOTIC ASSISTED LAPAROSCOPIC NEPHRECTOMY (Right)  SURGEON:  Surgeon(s) and Role:    Alexis Frock, MD - Primary  PHYSICIAN ASSISTANT:   ASSISTANTS: Debbrah Alar PA   ANESTHESIA:   local and general  EBL:  100 mL   BLOOD ADMINISTERED:none  DRAINS: Foley to gravity   LOCAL MEDICATIONS USED:  MARCAINE     SPECIMEN:  Source of Specimen:  Rt kidney with stent  DISPOSITION OF SPECIMEN:  PATHOLOGY  COUNTS:  YES  TOURNIQUET:  * No tourniquets in log *  DICTATION: .Other Dictation: Dictation Number [id number]  PLAN OF CARE: Admit to inpatient   PATIENT DISPOSITION:  PACU - hemodynamically stable.   Delay start of Pharmacological VTE agent (>24hrs) due to surgical blood loss or risk of bleeding: yes

## 2020-03-23 NOTE — Anesthesia Postprocedure Evaluation (Signed)
Anesthesia Post Note  Patient: [name] [name]  Procedure(s) Performed: XI ROBOTIC ASSISTED LAPAROSCOPIC NEPHRECTOMY (Right )     Patient location during evaluation: PACU Anesthesia Type: General Level of consciousness: awake and alert Pain management: pain level controlled Vital Signs Assessment: post-procedure vital signs reviewed and stable Respiratory status: spontaneous breathing, nonlabored ventilation and respiratory function stable Cardiovascular status: blood pressure returned to baseline and stable Postop Assessment: no apparent nausea or vomiting Anesthetic complications: yes Anesthetic complication details: anesthesia complicationsComments: Residual neuromuscular blockade - given additional 200 mg sugammadex with immediate improvement. Did not require respiratory support or reintubation.    Last Vitals:  Vitals:   03/23/20 1400 03/23/20 1437  BP: (!) 151/67 136/69  Pulse: 64 69  Resp:  18  Temp:  (!) 36.4 C  SpO2: 97% 99%    Last Pain:  Vitals:   03/23/20 1437  TempSrc: Oral  PainSc:                  Lidia Collum

## 2020-03-23 NOTE — Anesthesia Procedure Notes (Signed)
Procedure Name: Intubation Date/Time: 03/23/2020 7:33 AM Performed by: Lavina Hamman, CRNA Pre-anesthesia Checklist: Patient identified, Emergency Drugs available, Suction available and Patient being monitored Patient Re-evaluated:Patient Re-evaluated prior to induction Oxygen Delivery Method: Circle System Utilized Preoxygenation: Pre-oxygenation with 100% oxygen Induction Type: IV induction Ventilation: Oral airway inserted - appropriate to patient size Laryngoscope Size: Mac and 4 Grade View: Grade II Tube type: Oral Tube size: 7.0 mm Number of attempts: 1 Airway Equipment and Method: Stylet and Oral airway Placement Confirmation: ETT inserted through vocal cords under direct vision,  positive ETCO2 and breath sounds checked- equal and bilateral Secured at: 23 cm Tube secured with: Tape Dental Injury: Teeth and Oropharynx as per pre-operative assessment  Comments: Intubated by lauren ferm, srna under supervision of MD and CRNA

## 2020-03-23 NOTE — Transfer of Care (Signed)
Immediate Anesthesia Transfer of Care Note  Patient: [name] [name]  Procedure(s) Performed: XI ROBOTIC ASSISTED LAPAROSCOPIC NEPHRECTOMY (Right )  Patient Location: PACU  Anesthesia Type:General  Level of Consciousness: drowsy and responds to stimulation  Airway & Oxygen Therapy: Patient Spontanous Breathing and Patient connected to face mask oxygen  Post-op Assessment: Report given to RN and Post -op Vital signs reviewed and stable  Post vital signs: Reviewed and stable  Last Vitals:  Vitals Value Taken Time  BP 89/75 03/23/20 1034  Temp    Pulse 66 03/23/20 1035  Resp 17 03/23/20 1035  SpO2 100 % 03/23/20 1035  Vitals shown include unvalidated device data.  Last Pain:  Vitals:   03/23/20 0631  TempSrc:   PainSc: 3       Patients Stated Pain Goal: 2 (69/45/03 8882)  Complications: No apparent anesthesia complications

## 2020-03-24 LAB — BASIC METABOLIC PANEL
Anion gap: 9 (ref 5–15)
BUN: 15 mg/dL (ref 8–23)
CO2: 21 mmol/L — ABNORMAL LOW (ref 22–32)
Calcium: 8.3 mg/dL — ABNORMAL LOW (ref 8.9–10.3)
Chloride: 102 mmol/L (ref 98–111)
Creatinine, Ser: 1.19 mg/dL — ABNORMAL HIGH (ref 0.44–1.00)
GFR calc Af Amer: 51 mL/min — ABNORMAL LOW (ref >60)
GFR calc non Af Amer: 44 mL/min — ABNORMAL LOW (ref >60)
Glucose, Bld: 131 mg/dL — ABNORMAL HIGH (ref 70–99)
Potassium: 3.8 mmol/L (ref 3.5–5.1)
Sodium: 132 mmol/L — ABNORMAL LOW (ref 135–145)

## 2020-03-24 LAB — HEMOGLOBIN AND HEMATOCRIT, BLOOD
HCT: 42.2 % (ref 36.0–46.0)
Hemoglobin: 13.1 g/dL (ref 12.0–15.0)

## 2020-03-24 MED ORDER — ACETAMINOPHEN 325 MG PO TABS
650.0000 mg | ORAL_TABLET | ORAL | Status: DC | PRN
  Administered 2020-03-24 – 2020-03-25 (×3): 650 mg via ORAL
  Filled 2020-03-24 (×3): qty 2

## 2020-03-24 NOTE — Progress Notes (Signed)
Foley catheter removed at 1000 with no complaints from pt. Pt due to void by 1600. Will continue to monitor.

## 2020-03-24 NOTE — Op Note (Signed)
NAME: [name] [name], [name] [name] [place] RECORD FI:[id number] ACCOUNT [id number] DATE OF BIRTH:08/25/1941 FACILITY: [facility] LOCATION: [facility] PHYSICIAN:Kameren Pargas Tresa Moore, MD  OPERATIVE REPORT  DATE OF PROCEDURE:  03/23/2020  SURGEON:  Alexis Frock, MD  PREOPERATIVE DIAGNOSIS:  Chronic right pyelonephritis with ureteral stricture, partial atrophic kidney.  PROCEDURE:  Right robotic radical nephrectomy.  ESTIMATED BLOOD LOSS:  100 mL.  COMPLICATIONS:  None.  SPECIMEN:  Right kidney with ureteral stent en bloc for pathology.  FINDINGS: 1.  Single artery, single vein right renovascular anatomy. 2.  Very large gonadal vein on the right. 3.  Significant desmoplastic reaction around the kidney as expected, history of abscesses. 4.  Complete removal of right ureteral stent inspected and intact, en bloc specimen.  ASSISTANT:  Amanda L. Dancy, PA  DRAINS:   Foley to gravity.  INDICATIONS:  The patient is a very pleasant, but somewhat comorbid 79 year old lady with history of a partial right ureteral stricture for many years.  This has been intermittently obstructing with recurrent infections.  She had been managed with a  protocol of chronic right ureteral stenting change every 4-6 months and she has been very compliant with this.  However, despite renal drainage, she  still significant problems with the right kidney, including recurrent infections causing multiple  hospitalizations.  Options were discussed for further management, including reconstructive surgery versus nephrectomy.  Given her age and comorbidity, it was felt that the extirpative approach of nephrectomy would be most advantageous.  We discussed on  multiple occasions.  The patient and husband discussed and wished to proceed.  Informed consent was obtained and placed in the medical record.  DESCRIPTION OF PROCEDURE:  The patient being identified, the procedure being right robotic nephrectomy was confirmed.  Procedure timeout  was performed.  Intravenous antibiotics were administered.  General endotracheal anesthesia induced.  The patient was  placed in the right side up, full flank position, pulling 15 degrees of table flexion, superior arm elevator, axillary roll, sequential compression devices, bottom leg bent, top leg straight.  She was further fastened to operative table using 3-inch  tape over foam padding across the supraxiphoid chest and her pelvis.  Foley catheter was placed free to straight drain.  Sterile field was created, prepped and draping the patient's entire right flank and abdomen using chlorhexidine gluconate and a  high-flow, low-pressure pneumoperitoneum was obtained using Veress technique in the right lower quadrant, having passed the aspiration and drop test.  Next, an 8 mm robotic camera port was placed in position approximately 1-1/2 handbreadths superolateral  to the umbilicus.  Laparoscopic examination of the peritoneal cavity  revealed some loose adhesions in the right upper quadrant and lower liver edge, consistent with prior pyelonephritis.  No evidence of active infection.  Additional ports were then placed as follows:   Right subcostal 8 mm robotic port, right far lateral 8 mm robotic port approximately 4 fingerbreadths superior and medial to the anterior iliac spine, right inferior paramedian robotic port approximately 1 handbreadth superior to the pubic ramus, 2  assistant port sites in the right paramedian location, one 2 fingerbreadths above the planned camera port, one 2 fingerbreadths below.  Robot was then docked and passed electronic checks.  Attention was directed at development of the retroperitoneum.   Incision was made lateral to the ascending colon in the area of the cecum towards the area of the hepatic flexure and the colon was carefully swept medially.  Again, there was significant desmoplastic reaction as expected and very careful dissection was  performed to avoid any  devascularization of the colon and the mesentery.  The duodenum was also encountered and very carefully kocherized, sweeping this medially such that it lay medial to the lateral aspect of the inferior vena cava.  Lower pole kidney  area was identified, placed on gentle lateral traction.  Dissection proceeded medial to this.  The gonadal vessels were encountered.  Kidneys were noted to be very large on preoperative imaging and certainly were large and tortuous.  These were  incredibly adherent to the desmoplastic injury to the Gerota fascia.  Therefore, the gonadal vessel was controlled using Hem-o-Lok clip x2 proximal and distal.  The ureter area was identified, placed on gentle lateral traction.  Dissection proceeded in  triangle of the ureter and the plane of the ureter and psoas muscle towards the area of the renal hilum.  Renal hilum consisted of a single artery, single vein, right renovascular anatomy as anticipated.  Artery was controlled using extra-large Hem-o-Lok  clip proximal, vascular stapler distal and the vein controlled using vascular stapler, resulting in excellent hemostatic control of the hilum.  Superior attachments were carefully taken down using cautery scissors, as were lateral attachments.   Posterior plane was carefully developed then as well.  This plane was very much desmoplastic and essentially obliterated due to prior abscess in location.  However, having control of the hilum first, this was a very safe dissection.  The ureter was then  clipped proximal, cut slightly distal.  The stent was removed, inspected and intact.  The distal end was also controlled using extra-large Hem-o-Lok clip.  This completely freed up the right nephrectomy specimen.  It was placed into an extra-large  EndoCatch bag.  Sponge and needle counts were correct.  Hemostasis was excellent.  Robot was then undocked.  Specimen was retrieved by extending the previous 2 assistant port sites, and removing the right  radical nephrectomy specimen and setting it  aside for permanent pathology.  Given the patient's somewhat redundant body habitus and some frailty, the extraction site was closed at the level of the skin using a figure-of-eight Novafil x5, followed by reapproximation of Scarpa's with running Vicryl.   Additionally, all 8 mm port sites were closed at the deep dermal level using interrupted Vicryl and all skin sites were closed at the level of the skin using subcuticular Monocryl, followed by Dermabond.  Procedure was then terminated.  The patient  tolerated the procedure well.  No immediate perioperative complications.  The patient was taken to postanesthesia care unit in stable condition.    Please note, first assistant Debbrah Alar was crucial for all portions of the surgery today.  She provided invaluable retraction, suctioning, vascular clipping, vascular stapling and general first assistance.  VN/NUANCE  D:03/23/2020 T:03/23/2020 JOB:[id number]

## 2020-03-24 NOTE — Progress Notes (Signed)
1 Day Post-Op Subjective: Patient reports mild abdominal pain. No fevers. Pain controlled with tylenol. Negative flatus  Objective: Vital signs in last 24 hours: Temp:  [97.5 F (36.4 C)-98.4 F (36.9 C)] 98 F (36.7 C) (05/29 1502) Pulse Rate:  [66-91] 72 (05/29 1502) Resp:  [16-18] 16 (05/29 1502) BP: (120-148)/(58-93) 142/63 (05/29 1502) SpO2:  [92 %-96 %] 94 % (05/29 1502)  Intake/Output from previous day: 05/28 0701 - 05/29 0700 In: 5503 [P.O.:222; I.V.:4681; IV Piggyback:600] Out: 1440 [Urine:1340; Blood:100] Intake/Output this shift: Total I/O In: 120 [P.O.:120] Out: 975 [Urine:975]  Physical Exam:  General:alert, cooperative and appears stated age GI: soft, non tender, normal bowel sounds, no palpable masses, no organomegaly, no inguinal hernia Female genitalia: not done Extremities: extremities normal, atraumatic, no cyanosis or edema  Lab Results: Recent Labs    03/23/20 1103 03/24/20 0511  HGB 14.7 13.1  HCT 47.7* 42.2   BMET Recent Labs    03/24/20 0511  NA 132*  K 3.8  CL 102  CO2 21*  GLUCOSE 131*  BUN 15  CREATININE 1.19*  CALCIUM 8.3*   No results for input(s): LABPT, INR in the last 72 hours. No results for input(s): LABURIN in the last 72 hours. Results for orders placed or performed during the hospital encounter of 03/20/20  SARS CORONAVIRUS 2 (TAT 6-24 HRS) Nasopharyngeal Nasopharyngeal Swab     Status: None   Collection Time: 03/20/20  9:47 AM   Specimen: Nasopharyngeal Swab  Result Value Ref Range Status   SARS Coronavirus 2 NEGATIVE NEGATIVE Final    Comment: (NOTE) SARS-CoV-2 target nucleic acids are NOT DETECTED. The SARS-CoV-2 RNA is generally detectable in upper and lower respiratory specimens during the acute phase of infection. Negative results do not preclude SARS-CoV-2 infection, do not rule out co-infections with other pathogens, and should not be used as the sole basis for treatment or other patient management  decisions. Negative results must be combined with clinical observations, patient history, and epidemiological information. The expected result is Negative. Fact Sheet for Patients: SugarRoll.be Fact Sheet for Healthcare Providers: https://www.woods-mathews.com/ This test is not yet approved or cleared by the [place] FDA and  has been authorized for detection and/or diagnosis of SARS-CoV-2 by FDA under an Emergency Use Authorization (EUA). This EUA will remain  in effect (meaning this test can be used) for the duration of the COVID-19 declaration under Section 56 4(b)(1) of the Act, 21 U.S.C. section 360bbb-3(b)(1), unless the authorization is terminated or revoked sooner. Performed at [facility], [place] [address] [place], [place] [address]     Studies/Results: No results found.  Assessment/Plan: POD#1 right simple nephrectomy  1. Advance diet as tolerate 2. D/c foley 3. Likely discharge tomorrow   LOS: 1 day   Nicolette Bang 03/24/2020, 3:29 PM

## 2020-03-25 MED ORDER — APIXABAN 5 MG PO TABS: 5.0000 mg | ORAL_TABLET | Freq: Two times a day (BID) | ORAL | 4 refills | Status: DC

## 2020-03-25 NOTE — Discharge Summary (Signed)
Date of admission: 03/23/2020  Date of discharge: 03/25/2020  Admission diagnosis: chronically infected/atrophic right kidney   Discharge diagnosis: same  Secondary diagnoses:  Patient Active Problem List   Diagnosis Date Noted  . Nonfunctioning kidney 03/23/2020  . Urinary tract infection without hematuria   . Occlusion of ureteral stent ([place])   . Paroxysmal atrial fibrillation ([place]) 08/04/2017  . Chronic anticoagulation 08/04/2017  . Coronary artery disease 06/16/2017  . Encounter for monitoring flecainide therapy 07/30/2016  . Ureteral stricture, right 05/07/2016  . Sepsis ([place]) 05/07/2016  . Hydronephrosis 05/07/2016  . Congenital vaginal enterocele 04/15/2016  . Female genital prolapse 04/15/2016  . High risk medication use 04/15/2016  . Hyperlipemia 04/15/2016  . Midline cystocele 04/15/2016  . Palpitations 04/15/2016  . Tendonitis, Achilles 04/15/2016  . Chronic diastolic congestive heart failure ([place]) 05/31/2015  . Dyslipidemia 05/31/2015  . Diarrhea 06/11/2013  . E coli bacteremia 06/08/2013  . Anemia, unspecified 06/08/2013  . NSTEMI, initial episode of care ([place]) 06/07/2013  . Septic shock(785.52) 06/04/2013  . Acute respiratory failure with hypoxia ([place]) 06/04/2013  . Pyelonephritis 06/04/2013  . Ureteral obstruction, right 06/04/2013  . Acute renal failure ([place]) 06/04/2013  . Lactic acidosis 06/04/2013    Procedures performed: Procedure(s): XI ROBOTIC ASSISTED LAPAROSCOPIC NEPHRECTOMY  History and Physical: For full details, please see admission history and physical. Briefly, [name] [name] is a 79 y.o. year old patient with non-functioning atrophic right kidney.   Hospital Course: Patient tolerated the procedure well.  She was then transferred to the floor after an uneventful PACU stay.  Her hospital course was uncomplicated.  On POD#2 she had met discharge criteria: was eating a regular diet, was up and ambulating independently,  pain was well  controlled, was voiding without a catheter, and was ready to for discharge.   Laboratory values:  Recent Labs    03/23/20 1103 03/24/20 0511  HGB 14.7 13.1  HCT 47.7* 42.2   Recent Labs    03/24/20 0511  NA 132*  K 3.8  CL 102  CO2 21*  GLUCOSE 131*  BUN 15  CREATININE 1.19*  CALCIUM 8.3*   No results for input(s): LABPT, INR in the last 72 hours. No results for input(s): LABURIN in the last 72 hours. Results for orders placed or performed during the hospital encounter of 03/20/20  SARS CORONAVIRUS 2 (TAT 6-24 HRS) Nasopharyngeal Nasopharyngeal Swab     Status: None   Collection Time: 03/20/20  9:47 AM   Specimen: Nasopharyngeal Swab  Result Value Ref Range Status   SARS Coronavirus 2 NEGATIVE NEGATIVE Final    Comment: (NOTE) SARS-CoV-2 target nucleic acids are NOT DETECTED. The SARS-CoV-2 RNA is generally detectable in upper and lower respiratory specimens during the acute phase of infection. Negative results do not preclude SARS-CoV-2 infection, do not rule out co-infections with other pathogens, and should not be used as the sole basis for treatment or other patient management decisions. Negative results must be combined with clinical observations, patient history, and epidemiological information. The expected result is Negative. Fact Sheet for Patients: SugarRoll.be Fact Sheet for Healthcare Providers: https://www.woods-mathews.com/ This test is not yet approved or cleared by the [place] FDA and  has been authorized for detection and/or diagnosis of SARS-CoV-2 by FDA under an Emergency Use Authorization (EUA). This EUA will remain  in effect (meaning this test can be used) for the duration of the COVID-19 declaration under Section 56 4(b)(1) of the Act, 21 U.S.C. section 360bbb-3(b)(1), unless the authorization is  terminated or revoked sooner. Performed at [facility], [place] [address] [place],  [place] [address]     Disposition: Home  Discharge instruction: The patient was instructed to be ambulatory but told to refrain from heavy lifting, strenuous activity, or driving.   Discharge medications:  Allergies as of 03/25/2020      Reactions   Codeine Nausea Only, Other (See Comments)   hallucinations   Hydrocodone Nausea Only, Other (See Comments)   Macrodantin [nitrofurantoin] Nausea And Vomiting      Medication List    TAKE these medications   acetaminophen 500 MG tablet Commonly known as: TYLENOL Take 1,000 mg by mouth every 6 (six) hours as needed for moderate pain or headache.   amitriptyline 25 MG tablet Commonly known as: ELAVIL Take 25 mg by mouth at bedtime.   apixaban 5 MG Tabs tablet Commonly known as: Eliquis Take 1 tablet (5 mg total) by mouth 2 (two) times daily. Resume on June 5th. What changed:   how much to take  additional instructions   carvedilol 6.25 MG tablet Commonly known as: COREG TAKE 1/2 TABLET BY MOUTH TWICE DAILY What changed: when to take this   diltiazem 120 MG 24 hr capsule Commonly known as: CARDIZEM CD Take 1 capsule (120 mg total) by mouth every morning.   diltiazem 120 MG 24 hr capsule Commonly known as: TIAZAC Take 120 mg by mouth daily.   flecainide 50 MG tablet Commonly known as: TAMBOCOR Take 1.5 tablets (75 mg total) by mouth 2 (two) times daily.   hydroxyurea 500 MG capsule Commonly known as: HYDREA Take 500 mg by mouth every Monday, Wednesday, and Friday.   omeprazole 40 MG capsule Commonly known as: PRILOSEC Take 40 mg by mouth daily.   ondansetron 4 MG disintegrating tablet Commonly known as: ZOFRAN-ODT Take 4 mg by mouth every 6 (six) hours as needed for nausea/vomiting.   traMADol 50 MG tablet Commonly known as: Ultram Take 1-2 tablets (50-100 mg total) by mouth every 6 (six) hours as needed for moderate pain or severe pain.   URO-MP PO Take 1 capsule by mouth 2 (two) times daily as needed (pain).        Followup:  Follow-up Information    Alexis Frock, MD On 04/05/2020.   Specialty: Urology Why: at 10:00 Contact information: [place] [id number] [phone]

## 2020-03-25 NOTE — Progress Notes (Signed)
2 Days Post-Op Subjective: Patient reports mild abdominal pain. No fevers. Pain controlled with tylenol. Passing gas, tolerating regular food.  Catheter out, urinating well.  Objective: Vital signs in last 24 hours: Temp:  [98 F (36.7 C)-98.3 F (36.8 C)] 98.2 F (36.8 C) (05/30 0447) Pulse Rate:  [72-81] 77 (05/30 0447) Resp:  [16-18] 18 (05/30 0447) BP: (142-155)/(63-74) 144/68 (05/30 0447) SpO2:  [94 %-96 %] 94 % (05/30 0447)  Intake/Output from previous day: 05/29 0701 - 05/30 0700 In: 240 [P.O.:240] Out: 1575 [Urine:1575] Intake/Output this shift: Total I/O In: -  Out: 100 [Urine:100]  Physical Exam:  General:alert, cooperative and appears stated age GI: soft, non tender, normal bowel sounds, no palpable masses, no organomegaly, no inguinal hernia  Mild ecchymosis around incisions. Female genitalia: not done Extremities: extremities normal, atraumatic, no cyanosis or edema  Lab Results: Recent Labs    03/23/20 1103 03/24/20 0511  HGB 14.7 13.1  HCT 47.7* 42.2   BMET Recent Labs    03/24/20 0511  NA 132*  K 3.8  CL 102  CO2 21*  GLUCOSE 131*  BUN 15  CREATININE 1.19*  CALCIUM 8.3*   No results for input(s): LABPT, INR in the last 72 hours. No results for input(s): LABURIN in the last 72 hours. Results for orders placed or performed during the hospital encounter of 03/20/20  SARS CORONAVIRUS 2 (TAT 6-24 HRS) Nasopharyngeal Nasopharyngeal Swab     Status: None   Collection Time: 03/20/20  9:47 AM   Specimen: Nasopharyngeal Swab  Result Value Ref Range Status   SARS Coronavirus 2 NEGATIVE NEGATIVE Final    Comment: (NOTE) SARS-CoV-2 target nucleic acids are NOT DETECTED. The SARS-CoV-2 RNA is generally detectable in upper and lower respiratory specimens during the acute phase of infection. Negative results do not preclude SARS-CoV-2 infection, do not rule out co-infections with other pathogens, and should not be used as the sole basis for treatment  or other patient management decisions. Negative results must be combined with clinical observations, patient history, and epidemiological information. The expected result is Negative. Fact Sheet for Patients: SugarRoll.be Fact Sheet for Healthcare Providers: https://www.woods-mathews.com/ This test is not yet approved or cleared by the [place] FDA and  has been authorized for detection and/or diagnosis of SARS-CoV-2 by FDA under an Emergency Use Authorization (EUA). This EUA will remain  in effect (meaning this test can be used) for the duration of the COVID-19 declaration under Section 56 4(b)(1) of the Act, 21 U.S.C. section 360bbb-3(b)(1), unless the authorization is terminated or revoked sooner. Performed at [facility], [place] [address] [place],  [address]     Studies/Results: No results found.  Assessment/Plan: POD#2 right simple nephrectomy  Home today.   LOS: 2 days   Ardis Hughs 03/25/2020, 9:55 AM

## 2020-03-27 LAB — SURGICAL PATHOLOGY

## 2020-04-05 DIAGNOSIS — Z905 Acquired absence of kidney: Secondary | ICD-10-CM | POA: Diagnosis not present

## 2020-04-05 DIAGNOSIS — N202 Calculus of kidney with calculus of ureter: Secondary | ICD-10-CM | POA: Diagnosis not present

## 2020-04-09 DIAGNOSIS — I4891 Unspecified atrial fibrillation: Secondary | ICD-10-CM | POA: Diagnosis not present

## 2020-04-09 DIAGNOSIS — Z09 Encounter for follow-up examination after completed treatment for conditions other than malignant neoplasm: Secondary | ICD-10-CM | POA: Diagnosis not present

## 2020-04-09 DIAGNOSIS — Z1331 Encounter for screening for depression: Secondary | ICD-10-CM | POA: Diagnosis not present

## 2020-04-09 DIAGNOSIS — Z0000 Encounter for general adult medical examination without abnormal findings: Secondary | ICD-10-CM | POA: Diagnosis not present

## 2020-05-09 ENCOUNTER — Other Ambulatory Visit: Payer: Self-pay | Admitting: Cardiology

## 2020-05-14 ENCOUNTER — Other Ambulatory Visit: Payer: Self-pay | Admitting: Cardiology

## 2020-06-18 ENCOUNTER — Encounter: Payer: Self-pay | Admitting: Cardiology

## 2020-06-18 ENCOUNTER — Other Ambulatory Visit: Payer: Self-pay

## 2020-06-18 ENCOUNTER — Ambulatory Visit (INDEPENDENT_AMBULATORY_CARE_PROVIDER_SITE_OTHER): Payer: Medicare Other | Admitting: Cardiology

## 2020-06-18 VITALS — BP 130/76 | HR 70 | Ht 67.0 in | Wt 189.0 lb

## 2020-06-18 DIAGNOSIS — Z7901 Long term (current) use of anticoagulants: Secondary | ICD-10-CM

## 2020-06-18 DIAGNOSIS — E782 Mixed hyperlipidemia: Secondary | ICD-10-CM | POA: Diagnosis not present

## 2020-06-18 DIAGNOSIS — I480 Paroxysmal atrial fibrillation: Secondary | ICD-10-CM | POA: Diagnosis not present

## 2020-06-18 DIAGNOSIS — Z79899 Other long term (current) drug therapy: Secondary | ICD-10-CM | POA: Diagnosis not present

## 2020-06-18 DIAGNOSIS — Z5181 Encounter for therapeutic drug level monitoring: Secondary | ICD-10-CM

## 2020-06-18 DIAGNOSIS — I5032 Chronic diastolic (congestive) heart failure: Secondary | ICD-10-CM | POA: Diagnosis not present

## 2020-06-18 NOTE — Patient Instructions (Signed)
Medication Instructions:  Your physician recommends that you continue on your current medications as directed. Please refer to the Current Medication list given to you today.  *If you need a refill on your cardiac medications before your next appointment, please call your pharmacy*   Lab Work: None ordered None o If you have labs (blood work) drawn today and your tests are completely normal, you will receive your results only by: [name] MyChart Message (if you have MyChart) OR . A paper copy in the mail If you have any lab test that is abnormal or we need to change your treatment, we will call you to review the results.   Testing/Procedures: None ordered   Follow-Up: At [facility], you and your health needs are our priority.  As part of our continuing mission to provide you with exceptional heart care, we have created designated Provider Care Teams.  These Care Teams include your primary Cardiologist (physician) and Advanced Practice Providers (APPs -  Physician Assistants and Nurse Practitioners) who all work together to provide you with the care you need, when you need it.  We recommend signing up for the patient portal called "MyChart".  Sign up information is provided on this After Visit Summary.  MyChart is used to connect with patients for Virtual Visits (Telemedicine).  Patients are able to view lab/test results, encounter notes, upcoming appointments, etc.  Non-urgent messages can be sent to your provider as well.   To learn more about what you can do with MyChart, go to [email or web address].    Your next appointment:   5 month(s)  The format for your next appointment:   In Person  Provider:   Jenne Campus, MD   Other Instructions

## 2020-06-18 NOTE — Progress Notes (Signed)
Cardiology Office Note:    Date:  06/18/2020   ID:  [name] [name] [facility], DOB September 24, 1941, MRN [id number]  PCP:  [name], MD  Cardiologist:  Jenne Campus, MD    Referring MD: [name], MD   Chief Complaint  Patient presents with  . Follow-up  Doing well  History of Present Illness:    [name] [name] is a 80 y.o. female with past medical history significant for paroxysmal atrial fibrillation which is successfully suppressed with flecainide as well as anticoagulation, essential hypertension, chronic kidney problem.  She comes today to my office for follow-up she did have a robotic assisted right nephrectomy done.  She did very well with surgery and she is surprised with that.  She is feeling much better right now.  Denies any chest pain tightness squeezing pressure burning chest no palpitations no dizziness no swelling of lower extremities.  She looks good and feels well  Past Medical History:  Diagnosis Date  . Abdominal aortic atherosclerosis ([place]) 05/07/2016   Noted on CT abd/pelvis  . Acute renal failure ([place]) 06/04/2013  . Acute respiratory failure with hypoxia ([place]) 06/04/2013  . Anemia, unspecified 06/08/2013  . Anticoagulant long-term use    ELIQUIS  . Chronic anticoagulation 08/04/2017  . Chronic diastolic (congestive) heart failure (HCC)    hx of  . Chronic diastolic congestive heart failure ([place]) 05/31/2015  . Chronic kidney disease 2014   arf on dialysis in icu  . Congenital vaginal enterocele 04/15/2016  . Coronary artery disease   . Cystocele, midline    followed by dr c. Marvel Plan [facility] in [place])  pt uses pessary  . Diarrhea 06/11/2013  . Diverticulosis 05/07/2016   Noted on CT abd/pelvis  . Dyslipidemia 05/31/2015  . E coli bacteremia 06/08/2013  . Encounter for monitoring flecainide therapy 07/30/2016  . Female genital prolapse 04/15/2016  . First degree heart block   . General weakness   . GERD (gastroesophageal  reflux disease)   . Hematuria    intermittently due to ureter right stent  . Hiatal hernia   . High risk medication use 04/15/2016  . History of arteriovenostomy for renal dialysis ([place])   . History of colon polyps   . History of gastric polyp   . History of kidney infection    12-31-2018  perinephrtic abscess  right side  s/p drains x2 01-10-2019,  05/ 2020 drains removed  . History of kidney stones   . History of peptic ulcer disease   . History of septic shock    2011 (pt unaware) and 06-04-2013  w/ acute renal failure  . History of uterine cancer    STAGE I  --  S/P TAH W/ BSO  (NO OTHER TX)  . History of ventilator dependency ([place]) 2014   in setting of septic shock  . Hydronephrosis 05/07/2016  . Hyperlipemia 04/15/2016  . Hyperlipidemia   . Hypertension   . Iron deficiency anemia hematology/ oncology--- dr Bobby Rumpf at [facility] in [place]--- per lov note 08-07-2017  stabilized and felt to be more chronic anemia   01/ 2018  dx iron def. anemia  s/p  IV Iron infusion and taking oral iron supplement  . Lactic acidosis 06/04/2013  . Lumbar compression fracture ([place])    01-22-2019 per imaging L1  (06-01-2019 per pt currently no pain)  . Midline cystocele 04/15/2016  . NSTEMI, initial episode of care ([place]) 06/07/2013  . Occlusion of ureteral stent ([place])   .  PAF (paroxysmal atrial fibrillation) ([place]) DX JUNE 2012   CARDIOLOGIST--  DR Vernie Shanks--- [facility])  CHADS2  . Palpitations 04/15/2016  . Paroxysmal atrial fibrillation ([place]) 08/04/2017  . Presence of pessary   . Pyelonephritis 06/04/2013  . Right wrist fracture    per pt fractured on 03-10-2019,  had closed reduction and wearing a brace  . Sepsis ([place]) 05/07/2016  . Septic shock(785.52) 06/04/2013  . Tendonitis, Achilles 04/15/2016  . Ureteral obstruction, right 06/04/2013  . Ureteral stricture, right urologist-- dr Tresa Moore   Chronic--- treated with ureteral stent  . Urinary tract infection  without hematuria   . Uses walker    and wheelchair for longer distance  . Wears glasses     Past Surgical History:  Procedure Laterality Date  . APPENDECTOMY    . CARDIOVERSION  09/ 2018   dr Agustin Cree   successful ([facility] )  . CHOLECYSTECTOMY  2010  . COLONOSCOPY W/ POLYPECTOMY  05/2017  . CYSTO/  BALLOON DILATION RIGHT URETERAL STRICTURE/ STENT PLACEMENT  06-02-2013  . CYSTO/  BILATERAL RETROGRADE PYELOGRAM/ RIGHT URETEROSCOPY AND STENT PLACEMENT  10-04-2005  . CYSTO/ LEFT URETEROSCOPIC STONE EXTRACTION  04-03-2006  . CYSTOSCOPY W/ RETROGRADES Right 01/03/2019   Procedure: CYSTOSCOPY WITH RETROGRADE PYELOGRAM RIGHT WITH STENT EXCHANGE;  Surgeon: Irine Seal, MD;  Location: [facility];  Service: Urology;  Laterality: Right;  . CYSTOSCOPY W/ URETERAL STENT PLACEMENT Right 02/06/2014   Procedure: CYSTOSCOPY WITH RIGHT RETROGRADE PYELOGRAM RIGHT STENT REMOVAL and REPLACEMENT, Right Ureteroscopy;  Surgeon: Ailene Rud, MD;  Location: [facility];  Service: Urology;  Laterality: Right;  . CYSTOSCOPY W/ URETERAL STENT PLACEMENT Right 11/26/2016   Procedure: CYSTOSCOPY WITH RETROGRADE PYELOGRAM/URETERAL STENT REPLACEMENT;  Surgeon: Alexis Frock, MD;  Location: [facility];  Service: Urology;  Laterality: Right;  . CYSTOSCOPY W/ URETERAL STENT PLACEMENT Right 04/24/2017   Procedure: CYSTOSCOPY WITH RETROGRADE PYELOGRAM/URETERAL STENT REPLACEMENT;  Surgeon: Alexis Frock, MD;  Location: [facility];  Service: Urology;  Laterality: Right;  . CYSTOSCOPY W/ URETERAL STENT PLACEMENT Right 10/07/2017   Procedure: CYSTOSCOPY WITH RETROGRADE PYELOGRAM/URETERAL STENT EXCHANGE;  Surgeon: Alexis Frock, MD;  Location[facility];  Service: Urology;  Laterality: Right;  . CYSTOSCOPY W/ URETERAL STENT PLACEMENT Right 03/03/2018   Procedure: CYSTOSCOPY WITH RIGHT RETROGRADE / RIGHT URETERAL STENT EXCHANGE;  Surgeon: Alexis Frock, MD;  Location:  [facility];  Service: Urology;  Laterality: Right;  . CYSTOSCOPY W/ URETERAL STENT PLACEMENT Right 09/22/2018   Procedure: CYSTOSCOPY WITH RETROGRADE PYELOGRAM/URETERAL STENT PLACEMENT;  Surgeon: Alexis Frock, MD;  Location: [facility];  Service: Urology;  Laterality: Right;  45 MINS  . CYSTOSCOPY W/ URETERAL STENT PLACEMENT Right 06/03/2019   Procedure: CYSTOSCOPY WITH RETROGRADE PYELOGRAM/URETERAL STENT REPLACEMENT;  Surgeon: Alexis Frock, MD;  Location: [facility];  Service: Urology;  Laterality: Right;  . CYSTOSCOPY W/ URETERAL STENT PLACEMENT Right 11/23/2019   Procedure: CYSTOSCOPY WITH RETROGRADE PYELOGRAM/URETERAL STENT PLACEMENT;  Surgeon: Alexis Frock, MD;  Location: [facility];  Service: Urology;  Laterality: Right;  45 MINS  . CYSTOSCOPY WITH LITHOLAPAXY N/A 11/21/2013   Procedure: CYSTOSCOPY WITH LITHOLAPAXY;  Surgeon: Ailene Rud, MD;  Location: [facility];  Service: Urology;  Laterality: N/A;  . CYSTOSCOPY WITH RETROGRADE PYELOGRAM, URETEROSCOPY AND STENT PLACEMENT Bilateral 03/15/2014   Procedure: CYSTOSCOPY WITH BILATERAL RETROGRADE PYELOGRAM, RIGHT DIAGNOSTIC URETEROSCOPY AND STENT EXCHANGE;  Surgeon: Alexis Frock, MD;  Location: [facility];  Service:  Urology;  Laterality: Bilateral;  . CYSTOSCOPY WITH STENT PLACEMENT Right 05/07/2016   Procedure: CYSTOSCOPY WITH RETROGRADE PYELOGRAM, URETERAL STENT PLACEMENT;  Surgeon: Franchot Gallo, MD;  Location: [facility];  Service: Urology;  Laterality: Right;  . EYE SURGERY     Bilateral cataract removal  . HEMIARTHROPLASTY HIP Right 11/2016   fractured  . IR CATHETER TUBE CHANGE  02/04/2019  . IR CATHETER TUBE CHANGE  02/04/2019  . IR CATHETER TUBE CHANGE  02/11/2019  . IR RADIOLOGIST EVAL & MGMT  03/09/2019  . JOINT REPLACEMENT    . LUMBAR FUSION  JULY 2013  . NEPHROLITHOTOMY  X2 YRS AGO  . PERCUTANEOUS NEPHROSTOMY  12/2018  . ROBOT ASSISTED LAPAROSCOPIC NEPHRECTOMY  Right 03/23/2020   Procedure: XI ROBOTIC ASSISTED LAPAROSCOPIC NEPHRECTOMY;  Surgeon: Alexis Frock, MD;  Location: [facility];  Service: Urology;  Laterality: Right;  . TOTAL ABDOMINAL HYSTERECTOMY W/ BILATERAL SALPINGOOPHORECTOMY  1989  . TOTAL KNEE ARTHROPLASTY Bilateral 1992  &  1996  . TRANSTHORACIC ECHOCARDIOGRAM  07-24-2017   dr Agustin Cree   ef 60-65% (improved from last echo 2014, was 45-50%)/  trace TR/  mild AV sclerosis without stenosis  . URETEROSCOPY Right 11/21/2013   Procedure: URETEROSCOPY WITH STENT REMOVAL AND REPLACEMENT;  Surgeon: Ailene Rud, MD;  Location: [facility];  Service: Urology;  Laterality: Right;    Current Medications: Current Meds  Medication Sig  . acetaminophen (TYLENOL) 500 MG tablet Take 1,000 mg by mouth every 6 (six) hours as needed for moderate pain or headache.   [name] amitriptyline (ELAVIL) 25 MG tablet Take 25 mg by mouth at bedtime.  . carvedilol (COREG) 6.25 MG tablet TAKE 1/2 TABLET BY MOUTH TWICE DAILY  . diltiazem (CARDIZEM CD) 120 MG 24 hr capsule Take 1 capsule (120 mg total) by mouth every morning.  . diltiazem (TIAZAC) 120 MG 24 hr capsule Take 120 mg by mouth daily.  [name] ELIQUIS 5 MG TABS tablet TAKE ONE TABLET BY MOUTH TWICE DAILY  . flecainide (TAMBOCOR) 50 MG tablet Take 1.5 tablets (75 mg total) by mouth 2 (two) times daily.  . hydroxyurea (HYDREA) 500 MG capsule Take 500 mg by mouth every Monday, Wednesday, and Friday.   [name] omeprazole (PRILOSEC) 40 MG capsule Take 40 mg by mouth daily.   . ondansetron (ZOFRAN-ODT) 4 MG disintegrating tablet Take 4 mg by mouth every 6 (six) hours as needed for nausea/vomiting.     Allergies:   Codeine, Hydrocodone, and Macrodantin [nitrofurantoin]   Social History   Socioeconomic History  . Marital status: Married    Spouse name: Not on file  . Number of children: Not on file  . Years of education: Not on file  . Highest education level: Not on file  Occupational History  .  Occupation: Retired from [employer]  . Smoking status: Never Smoker  . Smokeless tobacco: Never Used  Vaping Use  . Vaping Use: Never used  Substance and Sexual Activity  . Alcohol use: No  . Drug use: No  . Sexual activity: Not on file  Other Topics Concern  . Not on file  Social History Narrative   Lives in [place] with husband. Drives, walks some, but not very active at home.    Social Determinants of Health   Financial Resource Strain:   . Difficulty of Paying Living Expenses: Not on file  Food Insecurity:   . Worried About Charity fundraiser in the Last Year: Not on file  .  Ran Out of Food in the Last Year: Not on file  Transportation Needs:   . Lack of Transportation (Medical): Not on file  . Lack of Transportation (Non-Medical): Not on file  Physical Activity:   . Days of Exercise per Week: Not on file  . Minutes of Exercise per Session: Not on file  Stress:   . Feeling of Stress : Not on file  Social Connections:   . Frequency of Communication with Friends and Family: Not on file  . Frequency of Social Gatherings with Friends and Family: Not on file  . Attends Religious Services: Not on file  . Active Member of Clubs or Organizations: Not on file  . Attends Archivist Meetings: Not on file  . Marital Status: Not on file     Family History: The patient's family history includes Cancer in her mother; Heart disease in her father and mother; Heart failure in her father and mother. ROS:   Please see the history of present illness.    All 14 point review of systems negative except as described per history of present illness  EKGs/Labs/Other Studies Reviewed:      Recent Labs: 03/19/2020: Platelets 467 03/24/2020: BUN 15; Creatinine, Ser 1.19; Hemoglobin 13.1; Potassium 3.8; Sodium 132  Recent Lipid Panel No results found for: CHOL, TRIG, HDL, CHOLHDL, VLDL, LDLCALC, LDLDIRECT  Physical Exam:    VS:  BP 130/76 (BP Location: Right Arm,  Patient Position: Sitting, Cuff Size: Normal)   Pulse 70   Ht 5\' 7"  (1.702 m)   Wt 189 lb (85.7 kg)   SpO2 95%   BMI 29.60 kg/m     Wt Readings from Last 3 Encounters:  06/18/20 189 lb (85.7 kg)  03/23/20 191 lb 12.8 oz (87 kg)  03/19/20 187 lb (84.8 kg)     GEN:  Well nourished, well developed in no acute distress HEENT: Normal NECK: No JVD; No carotid bruits LYMPHATICS: No lymphadenopathy CARDIAC: RRR, no murmurs, no rubs, no gallops RESPIRATORY:  Clear to auscultation without rales, wheezing or rhonchi  ABDOMEN: Soft, non-tender, non-distended MUSCULOSKELETAL:  No edema; No deformity  SKIN: Warm and dry LOWER EXTREMITIES: no swelling NEUROLOGIC:  Alert and oriented x 3 PSYCHIATRIC:  Normal affect   ASSESSMENT:    1. Paroxysmal atrial fibrillation (HCC)   2. Chronic diastolic congestive heart failure ([place])   3. Encounter for monitoring flecainide therapy   4. Mixed hyperlipidemia   5. Chronic anticoagulation    PLAN:    In order of problems listed above:  1. Paroxysmal atrial fibrillation successfully suppressed with flecainide which I will continue.  Her EKG today showed first-degree AV block but sinus rhythm, otherwise normal EKG.  QRS complex duration was 108 ms.  We will continue present management which also include anticoagulation. 2. Chronic diastolic congestive heart failure: Compensated continue present management. 3. Monitoring for flecainide therapy EKG looks good.  Continue present management. 4. Mixed dyslipidemia: I have last fasting lipid profile from 2020 and K PN showing LDL of 91 HDL of 46.  Will call primary care physician to get her fasting profile. 5. Status post right nephrectomy looks like it was uncomplicated course.  And she is doing well.  I did review record from that hospitalization   Medication Adjustments/Labs and Tests Ordered: Current medicines are reviewed at length with the patient today.  Concerns regarding medicines are outlined  above.  Orders Placed This Encounter  Procedures  . EKG 12-Lead   Medication changes: No orders  of the defined types were placed in this encounter.   Signed, Park Liter, MD, [facility] 06/18/2020 10:49 AM    [place]

## 2020-07-04 DIAGNOSIS — N819 Female genital prolapse, unspecified: Secondary | ICD-10-CM | POA: Diagnosis not present

## 2020-07-04 DIAGNOSIS — N8111 Cystocele, midline: Secondary | ICD-10-CM | POA: Diagnosis not present

## 2020-07-04 DIAGNOSIS — Z4689 Encounter for fitting and adjustment of other specified devices: Secondary | ICD-10-CM | POA: Diagnosis not present

## 2020-07-09 DIAGNOSIS — Z905 Acquired absence of kidney: Secondary | ICD-10-CM | POA: Diagnosis not present

## 2020-07-13 DIAGNOSIS — L259 Unspecified contact dermatitis, unspecified cause: Secondary | ICD-10-CM | POA: Diagnosis not present

## 2020-07-13 DIAGNOSIS — Z6832 Body mass index (BMI) 32.0-32.9, adult: Secondary | ICD-10-CM | POA: Diagnosis not present

## 2020-07-16 DIAGNOSIS — N202 Calculus of kidney with calculus of ureter: Secondary | ICD-10-CM | POA: Diagnosis not present

## 2020-08-09 DIAGNOSIS — Z23 Encounter for immunization: Secondary | ICD-10-CM | POA: Diagnosis not present

## 2020-08-24 ENCOUNTER — Other Ambulatory Visit: Payer: Self-pay | Admitting: Hematology and Oncology

## 2020-08-24 DIAGNOSIS — D509 Iron deficiency anemia, unspecified: Secondary | ICD-10-CM

## 2020-08-26 ENCOUNTER — Other Ambulatory Visit: Payer: Self-pay | Admitting: Cardiology

## 2020-08-31 DIAGNOSIS — L820 Inflamed seborrheic keratosis: Secondary | ICD-10-CM | POA: Diagnosis not present

## 2020-08-31 DIAGNOSIS — L300 Nummular dermatitis: Secondary | ICD-10-CM | POA: Diagnosis not present

## 2020-08-31 DIAGNOSIS — L570 Actinic keratosis: Secondary | ICD-10-CM | POA: Diagnosis not present

## 2020-09-02 ENCOUNTER — Other Ambulatory Visit: Payer: Self-pay | Admitting: Oncology

## 2020-09-02 DIAGNOSIS — D473 Essential (hemorrhagic) thrombocythemia: Secondary | ICD-10-CM

## 2020-09-02 NOTE — Progress Notes (Signed)
[facility]  [address] [place],  [place]  [address] [phone]  Clinic Day:  09/02/2020  Referring physician: [name], MD   HISTORY OF PRESENT ILLNESS:  The patient is a 79 y.o. female with essential thrombocythemia, which is based upon her having an elevated platelet count and testing positive for the JAK2 mutation.  She also has a history of iron deficiency anemia.  She takes hydroxyurea 500 mg every Monday, Wednesday and Friday to keep her platelet count under 400-600.  She comes in today to reassess her peripheral counts.  Since her last visit, the patient has been doing fairly well.  She denies having any side effects from her hydroxyurea.  She also denies having any clotting complications from her underlying essential thrombocythemia.  PHYSICAL EXAM:  There were no vitals taken for this visit. Wt Readings from Last 3 Encounters:  06/18/20 189 lb (85.7 kg)  03/23/20 191 lb 12.8 oz (87 kg)  03/19/20 187 lb (84.8 kg)   There is no height or weight on file to calculate BMI. Performance status (ECOG): 1 Physical Exam Constitutional:      Appearance: Normal appearance. She is not ill-appearing.  HENT:     Mouth/Throat:     Mouth: Mucous membranes are moist.     Pharynx: Oropharynx is clear. No oropharyngeal exudate or posterior oropharyngeal erythema.  Cardiovascular:     Rate and Rhythm: Normal rate and regular rhythm.     Heart sounds: No murmur heard.  No friction rub. No gallop.   Pulmonary:     Effort: Pulmonary effort is normal. No respiratory distress.     Breath sounds: Normal breath sounds. No wheezing, rhonchi or rales.  Abdominal:     General: Bowel sounds are normal. There is no distension.     Palpations: Abdomen is soft. There is no mass.     Tenderness: There is no abdominal tenderness.  Musculoskeletal:        General: No swelling.     Right lower leg: No edema.     Left lower leg: No edema.  Lymphadenopathy:      Cervical: No cervical adenopathy.     Upper Body:     Right upper body: No supraclavicular or axillary adenopathy.     Left upper body: No supraclavicular or axillary adenopathy.     Lower Body: No right inguinal adenopathy. No left inguinal adenopathy.  Skin:    General: Skin is warm.     Coloration: Skin is not jaundiced.     Findings: No lesion or rash.  Neurological:     General: No focal deficit present.     Mental Status: She is alert and oriented to person, place, and time. Mental status is at baseline.     Cranial Nerves: Cranial nerves are intact.  Psychiatric:        Mood and Affect: Mood normal.        Behavior: Behavior normal.        Thought Content: Thought content normal.     LABS:     Results for FOREST, PRUDEN ANN HAWKS (MRN [id number]) as of 09/03/2020 22:13  Ref. Range 09/03/2020 10:07  Iron Latest Ref Range: 28 - 170 ug/dL 101  UIBC Latest Units: ug/dL 284  TIBC Latest Ref Range: 250 - 450 ug/dL 385  Saturation Ratios Latest Ref Range: 10.4 - 31.8 % 26  Ferritin Latest Ref Range: 11 - 307 ng/mL 19    ASSESSMENT &  PLAN:   Assessment/Plan:  A 79 y.o. female with essential thrombocythemia, as well as a history iron deficiency anemia.  When evaluating her labs today, I am pleased as her platelets remain well below 600.  She will continue to take hydroxyurea 500 mg every Monday, Wednesday, and Friday to keep her platelets from precipitously rising. Of note, she is on Eliquis due to her history of atrial fibrillation. This should protect her from clotting complications from her underlying essential thrombocythemia.  As she is doing well, I will see her back in 6 months for repeat clinical assessment.  The patient understands all the plans discussed today and is in agreement with them.      Nickie Deren Macarthur Critchley, MD

## 2020-09-03 ENCOUNTER — Telehealth: Payer: Self-pay | Admitting: Oncology

## 2020-09-03 ENCOUNTER — Other Ambulatory Visit: Payer: Self-pay | Admitting: Oncology

## 2020-09-03 ENCOUNTER — Inpatient Hospital Stay: Payer: Medicare Other | Attending: Oncology

## 2020-09-03 ENCOUNTER — Encounter: Payer: Self-pay | Admitting: Oncology

## 2020-09-03 ENCOUNTER — Other Ambulatory Visit: Payer: Self-pay

## 2020-09-03 ENCOUNTER — Inpatient Hospital Stay (INDEPENDENT_AMBULATORY_CARE_PROVIDER_SITE_OTHER): Payer: Medicare Other | Admitting: Oncology

## 2020-09-03 VITALS — BP 156/81 | HR 79 | Temp 98.6°F | Resp 16 | Ht 67.0 in | Wt 195.3 lb

## 2020-09-03 DIAGNOSIS — D509 Iron deficiency anemia, unspecified: Secondary | ICD-10-CM | POA: Insufficient documentation

## 2020-09-03 DIAGNOSIS — I214 Non-ST elevation (NSTEMI) myocardial infarction: Secondary | ICD-10-CM

## 2020-09-03 DIAGNOSIS — Z0001 Encounter for general adult medical examination with abnormal findings: Secondary | ICD-10-CM | POA: Diagnosis not present

## 2020-09-03 DIAGNOSIS — D473 Essential (hemorrhagic) thrombocythemia: Secondary | ICD-10-CM

## 2020-09-03 DIAGNOSIS — D649 Anemia, unspecified: Secondary | ICD-10-CM | POA: Diagnosis not present

## 2020-09-03 LAB — IRON AND TIBC
Iron: 101 ug/dL (ref 28–170)
Saturation Ratios: 26 % (ref 10.4–31.8)
TIBC: 385 ug/dL (ref 250–450)
UIBC: 284 ug/dL

## 2020-09-03 LAB — FERRITIN: Ferritin: 19 ng/mL (ref 11–307)

## 2020-09-03 NOTE — Telephone Encounter (Signed)
Per 11/8 LOS.Appt given to patient 

## 2020-09-14 DIAGNOSIS — Z23 Encounter for immunization: Secondary | ICD-10-CM | POA: Diagnosis not present

## 2020-10-01 ENCOUNTER — Other Ambulatory Visit: Payer: Self-pay | Admitting: Oncology

## 2020-10-01 DIAGNOSIS — D509 Iron deficiency anemia, unspecified: Secondary | ICD-10-CM

## 2020-10-08 ENCOUNTER — Encounter: Payer: Self-pay | Admitting: Oncology

## 2020-11-05 ENCOUNTER — Other Ambulatory Visit: Payer: Self-pay | Admitting: Cardiology

## 2020-11-05 NOTE — Telephone Encounter (Signed)
Rx refill sent to pharmacy. 

## 2020-11-07 ENCOUNTER — Other Ambulatory Visit: Payer: Self-pay | Admitting: Cardiology

## 2020-11-19 DIAGNOSIS — Z973 Presence of spectacles and contact lenses: Secondary | ICD-10-CM | POA: Insufficient documentation

## 2020-11-19 DIAGNOSIS — S32000A Wedge compression fracture of unspecified lumbar vertebra, initial encounter for closed fracture: Secondary | ICD-10-CM | POA: Insufficient documentation

## 2020-11-19 DIAGNOSIS — S62101A Fracture of unspecified carpal bone, right wrist, initial encounter for closed fracture: Secondary | ICD-10-CM | POA: Insufficient documentation

## 2020-11-19 DIAGNOSIS — D509 Iron deficiency anemia, unspecified: Secondary | ICD-10-CM | POA: Insufficient documentation

## 2020-11-19 DIAGNOSIS — R319 Hematuria, unspecified: Secondary | ICD-10-CM | POA: Insufficient documentation

## 2020-11-19 DIAGNOSIS — K449 Diaphragmatic hernia without obstruction or gangrene: Secondary | ICD-10-CM | POA: Insufficient documentation

## 2020-11-19 DIAGNOSIS — Z8711 Personal history of peptic ulcer disease: Secondary | ICD-10-CM | POA: Insufficient documentation

## 2020-11-19 DIAGNOSIS — Z8619 Personal history of other infectious and parasitic diseases: Secondary | ICD-10-CM | POA: Insufficient documentation

## 2020-11-19 DIAGNOSIS — N8111 Cystocele, midline: Secondary | ICD-10-CM | POA: Insufficient documentation

## 2020-11-19 DIAGNOSIS — I10 Essential (primary) hypertension: Secondary | ICD-10-CM | POA: Insufficient documentation

## 2020-11-19 DIAGNOSIS — I440 Atrioventricular block, first degree: Secondary | ICD-10-CM | POA: Insufficient documentation

## 2020-11-19 DIAGNOSIS — Z960 Presence of urogenital implants: Secondary | ICD-10-CM | POA: Insufficient documentation

## 2020-11-19 DIAGNOSIS — Z992 Dependence on renal dialysis: Secondary | ICD-10-CM | POA: Insufficient documentation

## 2020-11-19 DIAGNOSIS — Z8719 Personal history of other diseases of the digestive system: Secondary | ICD-10-CM | POA: Insufficient documentation

## 2020-11-19 DIAGNOSIS — K219 Gastro-esophageal reflux disease without esophagitis: Secondary | ICD-10-CM | POA: Insufficient documentation

## 2020-11-19 DIAGNOSIS — Z7901 Long term (current) use of anticoagulants: Secondary | ICD-10-CM | POA: Insufficient documentation

## 2020-11-19 DIAGNOSIS — Z87442 Personal history of urinary calculi: Secondary | ICD-10-CM | POA: Insufficient documentation

## 2020-11-19 DIAGNOSIS — Z9989 Dependence on other enabling machines and devices: Secondary | ICD-10-CM | POA: Insufficient documentation

## 2020-11-19 DIAGNOSIS — Z87440 Personal history of urinary (tract) infections: Secondary | ICD-10-CM | POA: Insufficient documentation

## 2020-11-19 DIAGNOSIS — Z86010 Personal history of colonic polyps: Secondary | ICD-10-CM | POA: Insufficient documentation

## 2020-11-19 DIAGNOSIS — I5032 Chronic diastolic (congestive) heart failure: Secondary | ICD-10-CM | POA: Insufficient documentation

## 2020-11-19 DIAGNOSIS — I480 Paroxysmal atrial fibrillation: Secondary | ICD-10-CM | POA: Insufficient documentation

## 2020-11-19 DIAGNOSIS — E785 Hyperlipidemia, unspecified: Secondary | ICD-10-CM | POA: Insufficient documentation

## 2020-11-19 DIAGNOSIS — Z8542 Personal history of malignant neoplasm of other parts of uterus: Secondary | ICD-10-CM | POA: Insufficient documentation

## 2020-11-19 DIAGNOSIS — R531 Weakness: Secondary | ICD-10-CM | POA: Insufficient documentation

## 2020-11-20 ENCOUNTER — Ambulatory Visit (INDEPENDENT_AMBULATORY_CARE_PROVIDER_SITE_OTHER): Payer: Medicare Other | Admitting: Cardiology

## 2020-11-20 ENCOUNTER — Ambulatory Visit (INDEPENDENT_AMBULATORY_CARE_PROVIDER_SITE_OTHER): Payer: Medicare Other

## 2020-11-20 ENCOUNTER — Other Ambulatory Visit: Payer: Self-pay

## 2020-11-20 ENCOUNTER — Encounter: Payer: Self-pay | Admitting: Cardiology

## 2020-11-20 VITALS — BP 120/82 | HR 82 | Ht 67.0 in | Wt 199.0 lb

## 2020-11-20 DIAGNOSIS — I5032 Chronic diastolic (congestive) heart failure: Secondary | ICD-10-CM

## 2020-11-20 DIAGNOSIS — I10 Essential (primary) hypertension: Secondary | ICD-10-CM

## 2020-11-20 DIAGNOSIS — I440 Atrioventricular block, first degree: Secondary | ICD-10-CM | POA: Diagnosis not present

## 2020-11-20 DIAGNOSIS — I480 Paroxysmal atrial fibrillation: Secondary | ICD-10-CM | POA: Diagnosis not present

## 2020-11-20 DIAGNOSIS — I2510 Atherosclerotic heart disease of native coronary artery without angina pectoris: Secondary | ICD-10-CM

## 2020-11-20 NOTE — Patient Instructions (Signed)
Medication Instructions:  Your physician recommends that you continue on your current medications as directed. Please refer to the Current Medication list given to you today.  *If you need a refill on your cardiac medications before your next appointment, please call your pharmacy*   Lab Work: None If you have labs (blood work) drawn today and your tests are completely normal, you will receive your results only by: [name] MyChart Message (if you have MyChart) OR . A paper copy in the mail If you have any lab test that is abnormal or we need to change your treatment, we will call you to review the results.   Testing/Procedures: A zio monitor was ordered today. It will remain on for 7 days. You will then return monitor and event diary in provided box. It takes 1-2 weeks for report to be downloaded and returned to [place]. We will call you with the results. If monitor falls off or has orange flashing light, please call Zio for further instructions.      Follow-Up: At [facility], you and your health needs are our priority.  As part of our continuing mission to provide you with exceptional heart care, we have created designated Provider Care Teams.  These Care Teams include your primary Cardiologist (physician) and Advanced Practice Providers (APPs -  Physician Assistants and Nurse Practitioners) who all work together to provide you with the care you need, when you need it.  We recommend signing up for the patient portal called "MyChart".  Sign up information is provided on this After Visit Summary.  MyChart is used to connect with patients for Virtual Visits (Telemedicine).  Patients are able to view lab/test results, encounter notes, upcoming appointments, etc.  Non-urgent messages can be sent to your provider as well.   To learn more about what you can do with MyChart, go to [email or web address].    Your next appointment:   1 month(s)  The format for your next appointment:   In  Person  Provider:   Jenne Campus, MD   Other Instructions

## 2020-11-20 NOTE — Addendum Note (Signed)
Addended by: Senaida Ores on: 11/20/2020 10:10 AM   Modules accepted: Orders

## 2020-11-20 NOTE — Progress Notes (Signed)
Cardiology Office Note:    Date:  11/20/2020   ID:  [name] [name], DOB 05/29/1941, MRN [id number]  PCP:  [name], MD  Cardiologist:  Jenne Campus, MD    Referring MD: [name], MD   Chief Complaint  Patient presents with  . Follow-up  Have slow heart rate  History of Present Illness:    [name] [name] is a 80 y.o. female with past medical history significant for paroxysmal atrial fibrillation, successfully suppressed with flecainide also on beta-blocker as well as calcium channel blocker controlled for AV node, essential hypertension, chronic kidney problem, diastolic congestive heart failure.  She comes today to my office complaining of not feeling well sometimes she noticed her heart rate will be low like 30 she feels a bit woozy but not to the point of passing out she never passed out.  She does have energy good sensation can last almost all day interestingly today she feels well her EKG today showed normal 121 AV conduction with no problem.  Denies having any chest pain tightness squeezing pressure burning in the chest  Past Medical History:  Diagnosis Date  . Abdominal aortic atherosclerosis ([place]) 05/07/2016   Noted on CT abd/pelvis  . Acute renal failure ([place]) 06/04/2013  . Acute respiratory failure with hypoxia ([place]) 06/04/2013  . Anemia, unspecified 06/08/2013  . Anticoagulant long-term use    ELIQUIS  . Chronic anticoagulation 08/04/2017  . Chronic diastolic (congestive) heart failure (HCC)    hx of  . Chronic diastolic congestive heart failure ([place]) 05/31/2015  . Chronic kidney disease 2014   arf on dialysis in icu  . Congenital vaginal enterocele 04/15/2016  . Coronary artery disease   . Cystocele, midline    followed by dr c. [facility] in [place])  pt uses pessary  . Diarrhea 06/11/2013  . Diverticulosis 05/07/2016   Noted on CT abd/pelvis  . Dyslipidemia 05/31/2015  . E coli bacteremia 06/08/2013  . Encounter for  monitoring flecainide therapy 07/30/2016  . Female genital prolapse 04/15/2016  . First degree heart block   . General weakness   . GERD (gastroesophageal reflux disease)   . Hematuria    intermittently due to ureter right stent  . Hiatal hernia   . High risk medication use 04/15/2016  . History of arteriovenostomy for renal dialysis ([place])   . History of colon polyps   . History of gastric polyp   . History of kidney infection    12-31-2018  perinephrtic abscess  right side  s/p drains x2 01-10-2019,  05/ 2020 drains removed  . History of kidney stones   . History of peptic ulcer disease   . History of septic shock    2011 (pt unaware) and 06-04-2013  w/ acute renal failure  . History of uterine cancer    STAGE I  --  S/P TAH W/ BSO  (NO OTHER TX)  . History of ventilator dependency ([place]) 2014   in setting of septic shock  . Hydronephrosis 05/07/2016  . Hyperlipemia 04/15/2016  . Hyperlipidemia   . Hypertension   . Iron deficiency anemia hematology/ oncology--- dr Bobby Rumpf at [facility] in [place]--- per lov note 08-07-2017  stabilized and felt to be more chronic anemia   01/ 2018  dx iron def. anemia  s/p  IV Iron infusion and taking oral iron supplement  . Lactic acidosis 06/04/2013  . Lumbar compression fracture ([place])    01-22-2019 per imaging L1  (  06-01-2019 per pt currently no pain)  . Midline cystocele 04/15/2016  . Nonfunctioning kidney 03/23/2020  . NSTEMI, initial episode of care ([place]) 06/07/2013  . Occlusion of ureteral stent ([place])   . PAF (paroxysmal atrial fibrillation) ([place]) DX JUNE 2012   CARDIOLOGIST--  DR Vernie Shanks---  [facility])  CHADS2  . Palpitations 04/15/2016  . Paroxysmal atrial fibrillation ([place]) 08/04/2017  . Presence of pessary   . Pyelonephritis 06/04/2013  . Right wrist fracture    per pt fractured on 03-10-2019,  had closed reduction and wearing a brace  . Sepsis ([place]) 05/07/2016  . Septic shock(785.52) 06/04/2013  .  Tendonitis, Achilles 04/15/2016  . Ureteral obstruction, right 06/04/2013  . Ureteral stricture, right urologist-- dr Tresa Moore   Chronic--- treated with ureteral stent  . Urinary tract infection without hematuria   . Uses walker    and wheelchair for longer distance  . Wears glasses     Past Surgical History:  Procedure Laterality Date  . APPENDECTOMY    . CARDIOVERSION  09/ 2018   dr Agustin Cree   successful ([facility] )  . CHOLECYSTECTOMY  2010  . COLONOSCOPY W/ POLYPECTOMY  05/2017  . CYSTO/  BALLOON DILATION RIGHT URETERAL STRICTURE/ STENT PLACEMENT  06-02-2013  . CYSTO/  BILATERAL RETROGRADE PYELOGRAM/ RIGHT URETEROSCOPY AND STENT PLACEMENT  10-04-2005  . CYSTO/ LEFT URETEROSCOPIC STONE EXTRACTION  04-03-2006  . CYSTOSCOPY W/ RETROGRADES Right 01/03/2019   Procedure: CYSTOSCOPY WITH RETROGRADE PYELOGRAM RIGHT WITH STENT EXCHANGE;  Surgeon: Irine Seal, MD;  Location: [facility];  Service: Urology;  Laterality: Right;  . CYSTOSCOPY W/ URETERAL STENT PLACEMENT Right 02/06/2014   Procedure: CYSTOSCOPY WITH RIGHT RETROGRADE PYELOGRAM RIGHT STENT REMOVAL and REPLACEMENT, Right Ureteroscopy;  Surgeon: Ailene Rud, MD;  [facility];  Service: Urology;  Laterality: Right;  . CYSTOSCOPY W/ URETERAL STENT PLACEMENT Right 11/26/2016   Procedure: CYSTOSCOPY WITH RETROGRADE PYELOGRAM/URETERAL STENT REPLACEMENT;  Surgeon: Alexis Frock, MD;  Location: [facility]  Service: Urology;  Laterality: Right;  . CYSTOSCOPY W/ URETERAL STENT PLACEMENT Right 04/24/2017   Procedure: CYSTOSCOPY WITH RETROGRADE PYELOGRAM/URETERAL STENT REPLACEMENT;  Surgeon: Alexis Frock, MD;  Location: [facility];  Service: Urology;  Laterality: Right;  . CYSTOSCOPY W/ URETERAL STENT PLACEMENT Right 10/07/2017   Procedure: CYSTOSCOPY WITH RETROGRADE PYELOGRAM/URETERAL STENT EXCHANGE;  Surgeon: Alexis Frock, MD;  Location: [facility];  Service: Urology;   Laterality: Right;  . CYSTOSCOPY W/ URETERAL STENT PLACEMENT Right 03/03/2018   Procedure: CYSTOSCOPY WITH RIGHT RETROGRADE / RIGHT URETERAL STENT EXCHANGE;  Surgeon: Alexis Frock, MD;  Location: [facility];  Service: Urology;  Laterality: Right;  . CYSTOSCOPY W/ URETERAL STENT PLACEMENT Right 09/22/2018   Procedure: CYSTOSCOPY WITH RETROGRADE PYELOGRAM/URETERAL STENT PLACEMENT;  Surgeon: Alexis Frock, MD;  Location: [facility];  Service: Urology;  Laterality: Right;  45 MINS  . CYSTOSCOPY W/ URETERAL STENT PLACEMENT Right 06/03/2019   Procedure: CYSTOSCOPY WITH RETROGRADE PYELOGRAM/URETERAL STENT REPLACEMENT;  Surgeon: Alexis Frock, MD;  Location: [facility];  Service: Urology;  Laterality: Right;  . CYSTOSCOPY W/ URETERAL STENT PLACEMENT Right 11/23/2019   Procedure: CYSTOSCOPY WITH RETROGRADE PYELOGRAM/URETERAL STENT PLACEMENT;  Surgeon: Alexis Frock, MD;  Location: [facility];  Service: Urology;  Laterality: Right;  45 MINS  . CYSTOSCOPY WITH LITHOLAPAXY N/A 11/21/2013   Procedure: CYSTOSCOPY WITH LITHOLAPAXY;  Surgeon: Ailene Rud, MD;  Location: [facility];  Service: Urology;  Laterality: N/A;  .  CYSTOSCOPY WITH RETROGRADE PYELOGRAM, URETEROSCOPY AND STENT PLACEMENT Bilateral 03/15/2014   Procedure: CYSTOSCOPY WITH BILATERAL RETROGRADE PYELOGRAM, RIGHT DIAGNOSTIC URETEROSCOPY AND STENT EXCHANGE;  Surgeon: Alexis Frock, MD;  Location[facility];  Service: Urology;  Laterality: Bilateral;  . CYSTOSCOPY WITH STENT PLACEMENT Right 05/07/2016   Procedure: CYSTOSCOPY WITH RETROGRADE PYELOGRAM, URETERAL STENT PLACEMENT;  Surgeon: Franchot Gallo, MD;  Location: [facility];  Service: Urology;  Laterality: Right;  . EYE SURGERY     Bilateral cataract removal  . HEMIARTHROPLASTY HIP Right 11/2016   fractured  . IR CATHETER TUBE CHANGE  02/04/2019  . IR CATHETER TUBE CHANGE  02/04/2019  . IR CATHETER TUBE CHANGE  02/11/2019  . IR  RADIOLOGIST EVAL & MGMT  03/09/2019  . JOINT REPLACEMENT    . LUMBAR FUSION  JULY 2013  . NEPHROLITHOTOMY  X2 YRS AGO  . PERCUTANEOUS NEPHROSTOMY  12/2018  . ROBOT ASSISTED LAPAROSCOPIC NEPHRECTOMY Right 03/23/2020   Procedure: XI ROBOTIC ASSISTED LAPAROSCOPIC NEPHRECTOMY;  Surgeon: Alexis Frock, MD;  Location: [facility];  Service: Urology;  Laterality: Right;  . TOTAL ABDOMINAL HYSTERECTOMY W/ BILATERAL SALPINGOOPHORECTOMY  1989  . TOTAL KNEE ARTHROPLASTY Bilateral 1992  &  1996  . TRANSTHORACIC ECHOCARDIOGRAM  07-24-2017   dr Agustin Cree   ef 60-65% (improved from last echo 2014, was 45-50%)/  trace TR/  mild AV sclerosis without stenosis  . URETEROSCOPY Right 11/21/2013   Procedure: URETEROSCOPY WITH STENT REMOVAL AND REPLACEMENT;  Surgeon: Ailene Rud, MD;  Location[facility];  Service: Urology;  Laterality: Right;    Current Medications: Current Meds  Medication Sig  . acetaminophen (TYLENOL) 500 MG tablet Take 1,000 mg by mouth every 6 (six) hours as needed for moderate pain or headache.   [name] amitriptyline (ELAVIL) 25 MG tablet Take 25 mg by mouth at bedtime.  . carvedilol (COREG) 6.25 MG tablet TAKE 1/2 TABLET BY MOUTH TWICE DAILY  . diltiazem (CARDIZEM CD) 120 MG 24 hr capsule Take 1 capsule (120 mg total) by mouth every morning.  . diltiazem (TIAZAC) 120 MG 24 hr capsule Take 120 mg by mouth daily.  [name] ELIQUIS 5 MG TABS tablet TAKE ONE TABLET BY MOUTH TWICE DAILY  . flecainide (TAMBOCOR) 50 MG tablet Take 1.5 tablets (75 mg total) by mouth 2 (two) times daily.  . hydroxyurea (HYDREA) 500 MG capsule TAKE 1 Capsule one tab po every MON, WED, FRI - TAKE FOR ELEVATED PLATELETS  . omeprazole (PRILOSEC) 40 MG capsule Take 40 mg by mouth daily.   . ondansetron (ZOFRAN-ODT) 4 MG disintegrating tablet Take 4 mg by mouth every 6 (six) hours as needed for nausea/vomiting.     Allergies:   Codeine, Hydrocodone, and Macrodantin [nitrofurantoin]   Social History    Socioeconomic History  . Marital status: Married    Spouse name: Not on file  . Number of children: Not on file  . Years of education: Not on file  . Highest education level: Not on file  Occupational History  . Occupation: Retired from [employer]  . Smoking status: Never Smoker  . Smokeless tobacco: Never Used  Vaping Use  . Vaping Use: Never used  Substance and Sexual Activity  . Alcohol use: No  . Drug use: No  . Sexual activity: Not on file  Other Topics Concern  . Not on file  Social History Narrative   Lives in [place] with husband. Drives, walks some, but not very active at home.    Social Determinants  of Health   Financial Resource Strain: Not on file  Food Insecurity: Not on file  Transportation Needs: Not on file  Physical Activity: Not on file  Stress: Not on file  Social Connections: Not on file     Family History: The patient's family history includes Cancer in her mother; Heart disease in her father and mother; Heart failure in her father and mother. ROS:   Please see the history of present illness.    All 14 point review of systems negative except as described per history of present illness  EKGs/Labs/Other Studies Reviewed:      Recent Labs: 03/19/2020: Platelets 467 03/24/2020: BUN 15; Creatinine, Ser 1.19; Hemoglobin 13.1; Potassium 3.8; Sodium 132  Recent Lipid Panel No results found for: CHOL, TRIG, HDL, CHOLHDL, VLDL, LDLCALC, LDLDIRECT  Physical Exam:    VS:  BP 120/82 (BP Location: Left Arm, Patient Position: Sitting, Cuff Size: Normal)   Pulse 82   Ht 5\' 7"  (1.702 m)   Wt 199 lb (90.3 kg)   SpO2 98%   BMI 31.17 kg/m     Wt Readings from Last 3 Encounters:  11/20/20 199 lb (90.3 kg)  09/03/20 195 lb 4.8 oz (88.6 kg)  06/18/20 189 lb (85.7 kg)     GEN:  Well nourished, well developed in no acute distress HEENT: Normal NECK: No JVD; No carotid bruits LYMPHATICS: No lymphadenopathy CARDIAC: RRR, no murmurs, no  rubs, no gallops RESPIRATORY:  Clear to auscultation without rales, wheezing or rhonchi  ABDOMEN: Soft, non-tender, non-distended MUSCULOSKELETAL:  No edema; No deformity  SKIN: Warm and dry LOWER EXTREMITIES: no swelling NEUROLOGIC:  Alert and oriented x 3 PSYCHIATRIC:  Normal affect   ASSESSMENT:    1. Paroxysmal atrial fibrillation (HCC)   2. Primary hypertension   3. First degree heart block   4. Coronary artery disease involving native coronary artery of native heart without angina pectoris   5. Chronic diastolic congestive heart failure ([place])    PLAN:    In order of problems listed above:  1. Bradycardia recorded by pulse oximetry was followed by a pulse by patient at home.  We will put Zio patch AT on her so we can continue to monitor her heart rate see if there is any significant bradycardia multiple potential explanation for this slow pulse could be his frequent ventricular ectopy I, also she does take multiple medications that slow down conduction she may develop heart block.  Again will wait for results of the test before making some adjustment. 2. Essential hypertension blood pressure seems to be well controlled continue present management. 3. Congestive heart failure seems to be compensated on appropriate medication which I will continue. 4. Dyslipidemia: I did review her K PN from 2020 which show LDL of 91 HDL 46.   Medication Adjustments/Labs and Tests Ordered: Current medicines are reviewed at length with the patient today.  Concerns regarding medicines are outlined above.  No orders of the defined types were placed in this encounter.  Medication changes: No orders of the defined types were placed in this encounter.   Signed, Park Liter, MD, [facility] 11/20/2020 9:59 AM    [place]

## 2020-11-21 DIAGNOSIS — I480 Paroxysmal atrial fibrillation: Secondary | ICD-10-CM | POA: Diagnosis not present

## 2020-11-27 DIAGNOSIS — I480 Paroxysmal atrial fibrillation: Secondary | ICD-10-CM | POA: Diagnosis not present

## 2020-12-03 DIAGNOSIS — N819 Female genital prolapse, unspecified: Secondary | ICD-10-CM | POA: Diagnosis not present

## 2020-12-03 DIAGNOSIS — N8111 Cystocele, midline: Secondary | ICD-10-CM | POA: Diagnosis not present

## 2020-12-04 NOTE — Telephone Encounter (Signed)
Tried to call patient with results no answer will continue efforts.

## 2020-12-04 NOTE — Telephone Encounter (Signed)
Below is Dr. Julien Nordmann response for reference when the patient is reached.   The monitor report on the patient he requested is ready. The patient's monitor is largely unremarkable. Has symptoms which may or may not be related to frequent PVCs. Overall it is a benign report and no active treatment at this time. I would prefer that Dr. Agustin Cree take a call on whether he wants those PVCs to be treated. This is nonurgent. Kindly inform the patient.

## 2020-12-05 NOTE — Telephone Encounter (Signed)
Pt is returning call.  

## 2020-12-20 ENCOUNTER — Other Ambulatory Visit: Payer: Self-pay

## 2020-12-20 DIAGNOSIS — Z960 Presence of urogenital implants: Secondary | ICD-10-CM | POA: Insufficient documentation

## 2020-12-24 DIAGNOSIS — R310 Gross hematuria: Secondary | ICD-10-CM | POA: Diagnosis not present

## 2020-12-24 DIAGNOSIS — N202 Calculus of kidney with calculus of ureter: Secondary | ICD-10-CM | POA: Diagnosis not present

## 2020-12-28 ENCOUNTER — Other Ambulatory Visit: Payer: Self-pay

## 2020-12-28 ENCOUNTER — Encounter: Payer: Self-pay | Admitting: Cardiology

## 2020-12-28 ENCOUNTER — Ambulatory Visit (INDEPENDENT_AMBULATORY_CARE_PROVIDER_SITE_OTHER): Payer: Medicare Other | Admitting: Cardiology

## 2020-12-28 VITALS — BP 138/74 | HR 73 | Ht 67.0 in | Wt 198.0 lb

## 2020-12-28 DIAGNOSIS — I480 Paroxysmal atrial fibrillation: Secondary | ICD-10-CM

## 2020-12-28 DIAGNOSIS — I5032 Chronic diastolic (congestive) heart failure: Secondary | ICD-10-CM | POA: Diagnosis not present

## 2020-12-28 DIAGNOSIS — I493 Ventricular premature depolarization: Secondary | ICD-10-CM | POA: Diagnosis not present

## 2020-12-28 DIAGNOSIS — I2510 Atherosclerotic heart disease of native coronary artery without angina pectoris: Secondary | ICD-10-CM

## 2020-12-28 HISTORY — DX: Ventricular premature depolarization: I49.3

## 2020-12-28 LAB — BASIC METABOLIC PANEL
BUN/Creatinine Ratio: 18 (ref 12–28)
BUN: 20 mg/dL (ref 8–27)
CO2: 21 mmol/L (ref 20–29)
Calcium: 9.6 mg/dL (ref 8.7–10.3)
Chloride: 103 mmol/L (ref 96–106)
Creatinine, Ser: 1.1 mg/dL — ABNORMAL HIGH (ref 0.57–1.00)
Glucose: 94 mg/dL (ref 65–99)
Potassium: 4.7 mmol/L (ref 3.5–5.2)
Sodium: 138 mmol/L (ref 134–144)
eGFR: 51 mL/min/{1.73_m2} — ABNORMAL LOW (ref >59)

## 2020-12-28 LAB — MAGNESIUM: Magnesium: 1.9 mg/dL (ref 1.6–2.3)

## 2020-12-28 MED ORDER — CARVEDILOL 6.25 MG PO TABS: ORAL_TABLET | ORAL | 1 refills | Status: DC

## 2020-12-28 NOTE — Addendum Note (Signed)
Addended by: Senaida Ores on: 12/28/2020 10:29 AM   Modules accepted: Orders

## 2020-12-28 NOTE — Patient Instructions (Signed)
Medication Instructions:  Your physician has recommended you make the following change in your medication:   STOP: Cardizem   INCREASE: Carvedilol to 3.125 mg three times daily  *If you need a refill on your cardiac medications before your next appointment, please call your pharmacy*   Lab Work: None. If you have labs (blood work) drawn today and your tests are completely normal, you will receive your results only by: [name] MyChart Message (if you have MyChart) OR . A paper copy in the mail If you have any lab test that is abnormal or we need to change your treatment, we will call you to review the results.   Testing/Procedures: None   Follow-Up: At [facility], you and your health needs are our priority.  As part of our continuing mission to provide you with exceptional heart care, we have created designated Provider Care Teams.  These Care Teams include your primary Cardiologist (physician) and Advanced Practice Providers (APPs -  Physician Assistants and Nurse Practitioners) who all work together to provide you with the care you need, when you need it.  We recommend signing up for the patient portal called "MyChart".  Sign up information is provided on this After Visit Summary.  MyChart is used to connect with patients for Virtual Visits (Telemedicine).  Patients are able to view lab/test results, encounter notes, upcoming appointments, etc.  Non-urgent messages can be sent to your provider as well.   To learn more about what you can do with MyChart, go to [email or web address].    Your next appointment:   6 week(s)  The format for your next appointment:   In Person  Provider:   Jenne Campus, MD   Other Instructions

## 2020-12-28 NOTE — Addendum Note (Signed)
Addended by: Senaida Ores on: 12/28/2020 10:17 AM   Modules accepted: Orders

## 2020-12-28 NOTE — Progress Notes (Signed)
Cardiology Office Note:    Date:  12/28/2020   ID:  [name] [name], DOB [birth year], MRN [id number]  PCP:  [name], MD  Cardiologist:  Jenne Campus, MD    Referring MD: [name], MD   No chief complaint on file. Still have the same sensation  History of Present Illness:    [name] [name] is a 80 y.o. female with past medical history significant for paroxysmal atrial fibrillation, successfully suppressed with flecainide as well as beta-blocker and calcium channel blocker, essential hypertension, chronic kidney problem, diastolic congestive heart failure.  She comes today to my office for follow-up.  Last time I seen her she was complaining of feeling awful she gets it to situation that she was feeling like lightheaded almost to the point of passing out never did passed out.  She check her heart rate and symptoms her heart rate was in the neighborhood of 30.  She did wear monitor monitor showed frequent ventricular ectopy, total burden of PVCs was 14.3.  She also had frequent episode of ventricular bigeminy and she marked as symptomatic.  She clearly got symptomatic ventricular ectopy with ventricular bigeminy.  Otherwise when her heart is not slow she is doing great.  Past Medical History:  Diagnosis Date  . Abdominal aortic atherosclerosis ([place]) 05/07/2016   Noted on CT abd/pelvis  . Acute renal failure ([place]) 06/04/2013  . Acute respiratory failure with hypoxia ([place]) 06/04/2013  . Anemia, unspecified 06/08/2013  . Anticoagulant long-term use    ELIQUIS  . Chronic anticoagulation 08/04/2017  . Chronic diastolic (congestive) heart failure (HCC)    hx of  . Chronic diastolic congestive heart failure ([place]) 05/31/2015  . Chronic kidney disease 2014   arf on dialysis in icu  . Congenital vaginal enterocele 04/15/2016  . Coronary artery disease   . Cystocele, midline    followed by dr c. [facility] in [place])  pt uses pessary  . Diarrhea 06/11/2013   . Diverticulosis 05/07/2016   Noted on CT abd/pelvis  . Dyslipidemia 05/31/2015  . E coli bacteremia 06/08/2013  . Encounter for monitoring flecainide therapy 07/30/2016  . Female genital prolapse 04/15/2016  . First degree heart block   . General weakness   . GERD (gastroesophageal reflux disease)   . Hematuria    intermittently due to ureter right stent  . Hiatal hernia   . High risk medication use 04/15/2016  . History of arteriovenostomy for renal dialysis ([place])   . History of colon polyps   . History of gastric polyp   . History of kidney infection    12-31-2018  perinephrtic abscess  right side  s/p drains x2 01-10-2019,  05/ 2020 drains removed  . History of kidney stones   . History of peptic ulcer disease   . History of septic shock    2011 (pt unaware) and 06-04-2013  w/ acute renal failure  . History of uterine cancer    STAGE I  --  S/P TAH W/ BSO  (NO OTHER TX)  . History of ventilator dependency ([place]) 2014   in setting of septic shock  . Hydronephrosis 05/07/2016  . Hyperlipemia 04/15/2016  . Hyperlipidemia   . Hypertension   . Iron deficiency anemia hematology/ oncology--- dr Bobby Rumpf at [facility] in [place]--- per lov note 08-07-2017  stabilized and felt to be more chronic anemia   01/ 2018  dx iron def. anemia  s/p  IV Iron infusion and taking  oral iron supplement  . Lactic acidosis 06/04/2013  . Lumbar compression fracture ([place])    01-22-2019 per imaging L1  (06-01-2019 per pt currently no pain)  . Midline cystocele 04/15/2016  . Nonfunctioning kidney 03/23/2020  . NSTEMI, initial episode of care ([place]) 06/07/2013  . Occlusion of ureteral stent ([place])   . PAF (paroxysmal atrial fibrillation) ([place]) DX JUNE 2012   CARDIOLOGIST--  DR Vernie Shanks--- [place] [facility])  CHADS2  . Palpitations 04/15/2016  . Paroxysmal atrial fibrillation ([place]) 08/04/2017  . Presence of pessary   . Pyelonephritis 06/04/2013  . Right wrist fracture    per pt  fractured on 03-10-2019,  had closed reduction and wearing a brace  . Sepsis ([place]) 05/07/2016  . Septic shock(785.52) 06/04/2013  . Tendonitis, Achilles 04/15/2016  . Ureteral obstruction, right 06/04/2013  . Ureteral stricture, right urologist-- dr Tresa Moore   Chronic--- treated with ureteral stent  . Urinary tract infection without hematuria   . Uses walker    and wheelchair for longer distance  . Wears glasses     Past Surgical History:  Procedure Laterality Date  . APPENDECTOMY    . CARDIOVERSION  09/ 2018   dr Agustin Cree   successful ([facility] )  . CHOLECYSTECTOMY  2010  . COLONOSCOPY W/ POLYPECTOMY  05/2017  . CYSTO/  BALLOON DILATION RIGHT URETERAL STRICTURE/ STENT PLACEMENT  06-02-2013  . CYSTO/  BILATERAL RETROGRADE PYELOGRAM/ RIGHT URETEROSCOPY AND STENT PLACEMENT  10-04-2005  . CYSTO/ LEFT URETEROSCOPIC STONE EXTRACTION  04-03-2006  . CYSTOSCOPY W/ RETROGRADES Right 01/03/2019   Procedure: CYSTOSCOPY WITH RETROGRADE PYELOGRAM RIGHT WITH STENT EXCHANGE;  Surgeon: Irine Seal, MD;  Location: [facility];  Service: Urology;  Laterality: Right;  . CYSTOSCOPY W/ URETERAL STENT PLACEMENT Right 02/06/2014   Procedure: CYSTOSCOPY WITH RIGHT RETROGRADE PYELOGRAM RIGHT STENT REMOVAL and REPLACEMENT, Right Ureteroscopy;  Surgeon: Ailene Rud, MD;  Location[facility];  Service: Urology;  Laterality: Right;  . CYSTOSCOPY W/ URETERAL STENT PLACEMENT Right 11/26/2016   Procedure: CYSTOSCOPY WITH RETROGRADE PYELOGRAM/URETERAL STENT REPLACEMENT;  Surgeon: Alexis Frock, MD;  Location: [facility];  Service: Urology;  Laterality: Right;  . CYSTOSCOPY W/ URETERAL STENT PLACEMENT Right 04/24/2017   Procedure: CYSTOSCOPY WITH RETROGRADE PYELOGRAM/URETERAL STENT REPLACEMENT;  Surgeon: Alexis Frock, MD;  Location[facility];  Service: Urology;  Laterality: Right;  . CYSTOSCOPY W/ URETERAL STENT PLACEMENT Right 10/07/2017   Procedure: CYSTOSCOPY WITH RETROGRADE  PYELOGRAM/URETERAL STENT EXCHANGE;  Surgeon: Alexis Frock, MD;  Location: [facility];  Service: Urology;  Laterality: Right;  . CYSTOSCOPY W/ URETERAL STENT PLACEMENT Right 03/03/2018   Procedure: CYSTOSCOPY WITH RIGHT RETROGRADE / RIGHT URETERAL STENT EXCHANGE;  Surgeon: Alexis Frock, MD;  Location: [facility];  Service: Urology;  Laterality: Right;  . CYSTOSCOPY W/ URETERAL STENT PLACEMENT Right 09/22/2018   Procedure: CYSTOSCOPY WITH RETROGRADE PYELOGRAM/URETERAL STENT PLACEMENT;  Surgeon: Alexis Frock, MD;  Location: [facility];  Service: Urology;  Laterality: Right;  45 MINS  . CYSTOSCOPY W/ URETERAL STENT PLACEMENT Right 06/03/2019   Procedure: CYSTOSCOPY WITH RETROGRADE PYELOGRAM/URETERAL STENT REPLACEMENT;  Surgeon: Alexis Frock, MD;  Location: [facility];  Service: Urology;  Laterality: Right;  . CYSTOSCOPY W/ URETERAL STENT PLACEMENT Right 11/23/2019   Procedure: CYSTOSCOPY WITH RETROGRADE PYELOGRAM/URETERAL STENT PLACEMENT;  Surgeon: Alexis Frock, MD;  Location: [facility];  Service: Urology;  Laterality: Right;  45 MINS  . CYSTOSCOPY WITH LITHOLAPAXY N/A 11/21/2013   Procedure: CYSTOSCOPY  WITH LITHOLAPAXY;  Surgeon: Ailene Rud, MD;  Location: [facility];  Service: Urology;  Laterality: N/A;  . CYSTOSCOPY WITH RETROGRADE PYELOGRAM, URETEROSCOPY AND STENT PLACEMENT Bilateral 03/15/2014   Procedure: CYSTOSCOPY WITH BILATERAL RETROGRADE PYELOGRAM, RIGHT DIAGNOSTIC URETEROSCOPY AND STENT EXCHANGE;  Surgeon: Alexis Frock, MD;  Location: [facility];  Service: Urology;  Laterality: Bilateral;  . CYSTOSCOPY WITH STENT PLACEMENT Right 05/07/2016   Procedure: CYSTOSCOPY WITH RETROGRADE PYELOGRAM, URETERAL STENT PLACEMENT;  Surgeon: Franchot Gallo, MD;  Location: [facility];  Service: Urology;  Laterality: Right;  . EYE SURGERY     Bilateral cataract removal  . HEMIARTHROPLASTY HIP Right 11/2016   fractured   . IR CATHETER TUBE CHANGE  02/04/2019  . IR CATHETER TUBE CHANGE  02/04/2019  . IR CATHETER TUBE CHANGE  02/11/2019  . IR RADIOLOGIST EVAL & MGMT  03/09/2019  . JOINT REPLACEMENT    . LUMBAR FUSION  JULY 2013  . NEPHROLITHOTOMY  X2 YRS AGO  . PERCUTANEOUS NEPHROSTOMY  12/2018  . ROBOT ASSISTED LAPAROSCOPIC NEPHRECTOMY Right 03/23/2020   Procedure: XI ROBOTIC ASSISTED LAPAROSCOPIC NEPHRECTOMY;  Surgeon: Alexis Frock, MD;  Location: [facility];  Service: Urology;  Laterality: Right;  . TOTAL ABDOMINAL HYSTERECTOMY W/ BILATERAL SALPINGOOPHORECTOMY  1989  . TOTAL KNEE ARTHROPLASTY Bilateral 1992  &  1996  . TRANSTHORACIC ECHOCARDIOGRAM  07-24-2017   dr Agustin Cree   ef 60-65% (improved from last echo 2014, was 45-50%)/  trace TR/  mild AV sclerosis without stenosis  . URETEROSCOPY Right 11/21/2013   Procedure: URETEROSCOPY WITH STENT REMOVAL AND REPLACEMENT;  Surgeon: Ailene Rud, MD;  Location: [facility];  Service: Urology;  Laterality: Right;    Current Medications: Current Meds  Medication Sig  . acetaminophen (TYLENOL) 500 MG tablet Take 1,000 mg by mouth every 6 (six) hours as needed for moderate pain or headache.   [name] amitriptyline (ELAVIL) 25 MG tablet Take 25 mg by mouth at bedtime.  . carvedilol (COREG) 6.25 MG tablet TAKE 1/2 TABLET BY MOUTH TWICE DAILY  . cephALEXin (KEFLEX) 500 MG capsule Take 500 mg by mouth 3 (three) times daily.  [name] diltiazem (CARDIZEM CD) 120 MG 24 hr capsule Take 1 capsule (120 mg total) by mouth every morning.  [name] ELIQUIS 5 MG TABS tablet TAKE ONE TABLET BY MOUTH TWICE DAILY  . flecainide (TAMBOCOR) 50 MG tablet Take 1.5 tablets (75 mg total) by mouth 2 (two) times daily.  . hydroxyurea (HYDREA) 500 MG capsule TAKE 1 Capsule one tab po every MON, WED, FRI - TAKE FOR ELEVATED PLATELETS  . omeprazole (PRILOSEC) 40 MG capsule Take 40 mg by mouth daily.   . ondansetron (ZOFRAN-ODT) 4 MG disintegrating tablet Take 4 mg by mouth every 6 (six)  hours as needed for nausea/vomiting.  . [DISCONTINUED] potassium chloride (KLOR-CON) 10 MEQ tablet Take 1 tablet by mouth daily.     Allergies:   Codeine, Hydrocodone, and Macrodantin [nitrofurantoin]   Social History   Socioeconomic History  . Marital status: Married    Spouse name: Not on file  . Number of children: Not on file  . Years of education: Not on file  . Highest education level: Not on file  Occupational History  . Occupation: Retired from [employer]  . Smoking status: Never Smoker  . Smokeless tobacco: Never Used  Vaping Use  . Vaping Use: Never used  Substance and Sexual Activity  . Alcohol use: No  . Drug use: No  . Sexual  activity: Not on file  Other Topics Concern  . Not on file  Social History Narrative   Lives in [place] with husband. Drives, walks some, but not very active at home.    Social Determinants of Health   Financial Resource Strain: Not on file  Food Insecurity: Not on file  Transportation Needs: Not on file  Physical Activity: Not on file  Stress: Not on file  Social Connections: Not on file     Family History: The patient's family history includes Cancer in her mother; Heart disease in her father and mother; Heart failure in her father and mother. ROS:   Please see the history of present illness.    All 14 point review of systems negative except as described per history of present illness  EKGs/Labs/Other Studies Reviewed:      Recent Labs: 03/19/2020: Platelets 467 03/24/2020: BUN 15; Creatinine, Ser 1.19; Hemoglobin 13.1; Potassium 3.8; Sodium 132  Recent Lipid Panel No results found for: CHOL, TRIG, HDL, CHOLHDL, VLDL, LDLCALC, LDLDIRECT  Physical Exam:    VS:  BP 138/74 (BP Location: Left Arm, Patient Position: Sitting)   Pulse 73   Ht 5\' 7"  (1.702 m)   Wt 198 lb (89.8 kg)   SpO2 97%   BMI 31.01 kg/m     Wt Readings from Last 3 Encounters:  12/28/20 198 lb (89.8 kg)  11/20/20 199 lb (90.3 kg)   09/03/20 195 lb 4.8 oz (88.6 kg)     GEN:  Well nourished, well developed in no acute distress HEENT: Normal NECK: No JVD; No carotid bruits LYMPHATICS: No lymphadenopathy CARDIAC: RRR, no murmurs, no rubs, no gallops RESPIRATORY:  Clear to auscultation without rales, wheezing or rhonchi  ABDOMEN: Soft, non-tender, non-distended MUSCULOSKELETAL:  No edema; No deformity  SKIN: Warm and dry LOWER EXTREMITIES: no swelling NEUROLOGIC:  Alert and oriented x 3 PSYCHIATRIC:  Normal affect   ASSESSMENT:    1. Paroxysmal atrial fibrillation (HCC)   2. Ventricular ectopy   3. Coronary artery disease involving native coronary artery of native heart without angina pectoris   4. Chronic diastolic (congestive) heart failure (HCC)    PLAN:    In order of problems listed above:  1. Frequent ventricular ectopy that is responsible for his symptomatology.  This is a new discovery.  I will try to increase dose of beta-blocker.  I will discontinue her Cardizem I will increase dose of carvedilol from 6.25 twice daily to initially 3 times a day and then 12.5 twice daily.  I warned her about potential side effect of this maneuver meaning we can slow her heart down even more make her feel worse but hopefully will be able to accomplish better control of her PVCs with higher dose of beta-blocker.  I will check Chem-7 as well as magnesium today. 2. Coronary disease stable from that point review. 3. Paroxysmal atrial fibrillation, she is anticoagulated which I will continue.  No recent episode of arrhythmia.   Medication Adjustments/Labs and Tests Ordered: Current medicines are reviewed at length with the patient today.  Concerns regarding medicines are outlined above.  No orders of the defined types were placed in this encounter.  Medication changes: No orders of the defined types were placed in this encounter.   Signed, Park Liter, MD, [facility] 12/28/2020 10:10 AM    [place]

## 2021-01-08 DIAGNOSIS — N202 Calculus of kidney with calculus of ureter: Secondary | ICD-10-CM | POA: Diagnosis not present

## 2021-01-08 DIAGNOSIS — N2889 Other specified disorders of kidney and ureter: Secondary | ICD-10-CM | POA: Diagnosis not present

## 2021-01-08 DIAGNOSIS — R310 Gross hematuria: Secondary | ICD-10-CM | POA: Diagnosis not present

## 2021-01-08 DIAGNOSIS — N281 Cyst of kidney, acquired: Secondary | ICD-10-CM | POA: Diagnosis not present

## 2021-01-08 DIAGNOSIS — N200 Calculus of kidney: Secondary | ICD-10-CM | POA: Diagnosis not present

## 2021-01-22 DIAGNOSIS — R319 Hematuria, unspecified: Secondary | ICD-10-CM | POA: Diagnosis not present

## 2021-01-22 DIAGNOSIS — Z6832 Body mass index (BMI) 32.0-32.9, adult: Secondary | ICD-10-CM | POA: Diagnosis not present

## 2021-02-12 ENCOUNTER — Encounter: Payer: Self-pay | Admitting: Cardiology

## 2021-02-12 ENCOUNTER — Other Ambulatory Visit: Payer: Self-pay

## 2021-02-12 ENCOUNTER — Ambulatory Visit (INDEPENDENT_AMBULATORY_CARE_PROVIDER_SITE_OTHER): Payer: Medicare Other | Admitting: Cardiology

## 2021-02-12 VITALS — BP 116/72 | HR 60 | Ht 67.0 in | Wt 196.0 lb

## 2021-02-12 DIAGNOSIS — R002 Palpitations: Secondary | ICD-10-CM | POA: Diagnosis not present

## 2021-02-12 DIAGNOSIS — I493 Ventricular premature depolarization: Secondary | ICD-10-CM | POA: Diagnosis not present

## 2021-02-12 DIAGNOSIS — I480 Paroxysmal atrial fibrillation: Secondary | ICD-10-CM | POA: Diagnosis not present

## 2021-02-12 DIAGNOSIS — I2510 Atherosclerotic heart disease of native coronary artery without angina pectoris: Secondary | ICD-10-CM | POA: Diagnosis not present

## 2021-02-12 NOTE — Progress Notes (Signed)
Cardiology Office Note:    Date:  02/12/2021   ID:  Maddyx Vallie Anderle, DOB [birth year], MRN [id number]  PCP:  [name], MD  Cardiologist:  Jenne Campus, MD    Referring MD: [name], MD   Chief Complaint  Patient presents with  . HR low    History of Present Illness:    Kellyanne Ellwanger Buswell is a 80 y.o. female with past medical history significant for paroxysmal atrial fibrillation, that being successfully suppressed with flecainide, also anticoagulated.  Recently she started complaining of having weakness fatigue and tiredness.  She was find to have frequent ventricular ectopy with total burden of 14.3% with episode of ventricular bigeminy.  And of course at that time she does have effective heart rate being slow.  I do have minimally increased dose of carvedilol she takes 3.125 twice daily increase this to 3 times a day that did not make much difference.  She is still complaining of having 2 steps of slow heart rate and again today explained to her was the mechanism of her bradycardia eyes and I hope by increasing dose of beta-blocker a little bit more we will be able to suppress PVCs sufficiently that her heart rate effective will be better however I think we reached the point that help of electrophysiology team is necessary and I will refer her to see our partner from EP department.  Overall she denies have any dizziness or passing out just profound fatigue tiredness and weakness.  Past Medical History:  Diagnosis Date  . Abdominal aortic atherosclerosis ([place]) 05/07/2016   Noted on CT abd/pelvis  . Acute renal failure ([place]) 06/04/2013  . Acute respiratory failure with hypoxia ([place]) 06/04/2013  . Anemia, unspecified 06/08/2013  . Anticoagulant long-term use    ELIQUIS  . Chronic anticoagulation 08/04/2017  . Chronic diastolic (congestive) heart failure (HCC)    hx of  . Chronic diastolic congestive heart failure ([place]) 05/31/2015  . Chronic kidney disease 2014   arf on dialysis  in icu  . Congenital vaginal enterocele 04/15/2016  . Coronary artery disease   . Cystocele, midline    followed by dr c. [facility] in [place])  pt uses pessary  . Diarrhea 06/11/2013  . Diverticulosis 05/07/2016   Noted on CT abd/pelvis  . Dyslipidemia 05/31/2015  . E coli bacteremia 06/08/2013  . Encounter for monitoring flecainide therapy 07/30/2016  . Female genital prolapse 04/15/2016  . First degree heart block   . General weakness   . GERD (gastroesophageal reflux disease)   . Hematuria    intermittently due to ureter right stent  . Hiatal hernia   . High risk medication use 04/15/2016  . History of arteriovenostomy for renal dialysis ([place])   . History of colon polyps   . History of gastric polyp   . History of kidney infection    12-31-2018  perinephrtic abscess  right side  s/p drains x2 01-10-2019,  05/ 2020 drains removed  . History of kidney stones   . History of peptic ulcer disease   . History of septic shock    2011 (pt unaware) and 06-04-2013  w/ acute renal failure  . History of uterine cancer    STAGE I  --  S/P TAH W/ BSO  (NO OTHER TX)  . History of ventilator dependency ([place]) 2014   in setting of septic shock  . Hydronephrosis 05/07/2016  . Hyperlipemia 04/15/2016  . Hyperlipidemia   . Hypertension   .  Iron deficiency anemia hematology/ oncology--- dr Bobby Rumpf at [facility] in [place]--- per lov note 08-07-2017  stabilized and felt to be more chronic anemia   01/ 2018  dx iron def. anemia  s/p  IV Iron infusion and taking oral iron supplement  . Lactic acidosis 06/04/2013  . Lumbar compression fracture ([place])    01-22-2019 per imaging L1  (06-01-2019 per pt currently no pain)  . Midline cystocele 04/15/2016  . Nonfunctioning kidney 03/23/2020  . NSTEMI, initial episode of care ([place]) 06/07/2013  . Occlusion of ureteral stent ([place])   . PAF (paroxysmal atrial fibrillation) ([place]) DX JUNE 2012   CARDIOLOGIST--  DR Vernie Shanks---  [facility])  CHADS2  . Palpitations 04/15/2016  . Paroxysmal atrial fibrillation ([place]) 08/04/2017  . Presence of pessary   . Pyelonephritis 06/04/2013  . Right wrist fracture    per pt fractured on 03-10-2019,  had closed reduction and wearing a brace  . Sepsis ([place]) 05/07/2016  . Septic shock(785.52) 06/04/2013  . Tendonitis, Achilles 04/15/2016  . Ureteral obstruction, right 06/04/2013  . Ureteral stricture, right urologist-- dr Tresa Moore   Chronic--- treated with ureteral stent  . Urinary tract infection without hematuria   . Uses walker    and wheelchair for longer distance  . Ventricular ectopy 12/28/2020  . Wears glasses     Past Surgical History:  Procedure Laterality Date  . APPENDECTOMY    . CARDIOVERSION  09/ 2018   dr Agustin Cree   successful ([facility] )  . CHOLECYSTECTOMY  2010  . COLONOSCOPY W/ POLYPECTOMY  05/2017  . CYSTO/  BALLOON DILATION RIGHT URETERAL STRICTURE/ STENT PLACEMENT  06-02-2013  . CYSTO/  BILATERAL RETROGRADE PYELOGRAM/ RIGHT URETEROSCOPY AND STENT PLACEMENT  10-04-2005  . CYSTO/ LEFT URETEROSCOPIC STONE EXTRACTION  04-03-2006  . CYSTOSCOPY W/ RETROGRADES Right 01/03/2019   Procedure: CYSTOSCOPY WITH RETROGRADE PYELOGRAM RIGHT WITH STENT EXCHANGE;  Surgeon: Irine Seal, MD;  Location: [facility];  Service: Urology;  Laterality: Right;  . CYSTOSCOPY W/ URETERAL STENT PLACEMENT Right 02/06/2014   Procedure: CYSTOSCOPY WITH RIGHT RETROGRADE PYELOGRAM RIGHT STENT REMOVAL and REPLACEMENT, Right Ureteroscopy;  Surgeon: Ailene Rud, MD;  Location: [facility];  Service: Urology;  Laterality: Right;  . CYSTOSCOPY W/ URETERAL STENT PLACEMENT Right 11/26/2016   Procedure: CYSTOSCOPY WITH RETROGRADE PYELOGRAM/URETERAL STENT REPLACEMENT;  Surgeon: Alexis Frock, MD;  Location: [facility];  Service: Urology;  Laterality: Right;  . CYSTOSCOPY W/ URETERAL STENT PLACEMENT Right 04/24/2017   Procedure: CYSTOSCOPY WITH  RETROGRADE PYELOGRAM/URETERAL STENT REPLACEMENT;  Surgeon: Alexis Frock, MD;  Location: [facility];  Service: Urology;  Laterality: Right;  . CYSTOSCOPY W/ URETERAL STENT PLACEMENT Right 10/07/2017   Procedure: CYSTOSCOPY WITH RETROGRADE PYELOGRAM/URETERAL STENT EXCHANGE;  Surgeon: Alexis Frock, MD;  Location: [facility];  Service: Urology;  Laterality: Right;  . CYSTOSCOPY W/ URETERAL STENT PLACEMENT Right 03/03/2018   Procedure: CYSTOSCOPY WITH RIGHT RETROGRADE / RIGHT URETERAL STENT EXCHANGE;  Surgeon: Alexis Frock, MD;  Location: [facility];  Service: Urology;  Laterality: Right;  . CYSTOSCOPY W/ URETERAL STENT PLACEMENT Right 09/22/2018   Procedure: CYSTOSCOPY WITH RETROGRADE PYELOGRAM/URETERAL STENT PLACEMENT;  Surgeon: Alexis Frock, MD;  Location: [facility];  Service: Urology;  Laterality: Right;  45 MINS  . CYSTOSCOPY W/ URETERAL STENT PLACEMENT Right 06/03/2019   Procedure: CYSTOSCOPY WITH RETROGRADE PYELOGRAM/URETERAL STENT REPLACEMENT;  Surgeon: Alexis Frock, MD;  [facility];  Service: Urology;  Laterality: Right;  .  CYSTOSCOPY W/ URETERAL STENT PLACEMENT Right 11/23/2019   Procedure: CYSTOSCOPY WITH RETROGRADE PYELOGRAM/URETERAL STENT PLACEMENT;  Surgeon: Alexis Frock, MD;  Location: [facility];  Service: Urology;  Laterality: Right;  45 MINS  . CYSTOSCOPY WITH LITHOLAPAXY N/A 11/21/2013   Procedure: CYSTOSCOPY WITH LITHOLAPAXY;  Surgeon: Ailene Rud, MD;  Location: [facility];  Service: Urology;  Laterality: N/A;  . CYSTOSCOPY WITH RETROGRADE PYELOGRAM, URETEROSCOPY AND STENT PLACEMENT Bilateral 03/15/2014   Procedure: CYSTOSCOPY WITH BILATERAL RETROGRADE PYELOGRAM, RIGHT DIAGNOSTIC URETEROSCOPY AND STENT EXCHANGE;  Surgeon: Alexis Frock, MD;  [facility];  Service: Urology;  Laterality: Bilateral;  . CYSTOSCOPY WITH STENT PLACEMENT Right 05/07/2016    Procedure: CYSTOSCOPY WITH RETROGRADE PYELOGRAM, URETERAL STENT PLACEMENT;  Surgeon: Franchot Gallo, MD;  Location[facility];  Service: Urology;  Laterality: Right;  . EYE SURGERY     Bilateral cataract removal  . HEMIARTHROPLASTY HIP Right 11/2016   fractured  . IR CATHETER TUBE CHANGE  02/04/2019  . IR CATHETER TUBE CHANGE  02/04/2019  . IR CATHETER TUBE CHANGE  02/11/2019  . IR RADIOLOGIST EVAL & MGMT  03/09/2019  . JOINT REPLACEMENT    . LUMBAR FUSION  JULY 2013  . NEPHROLITHOTOMY  X2 YRS AGO  . PERCUTANEOUS NEPHROSTOMY  12/2018  . ROBOT ASSISTED LAPAROSCOPIC NEPHRECTOMY Right 03/23/2020   Procedure: XI ROBOTIC ASSISTED LAPAROSCOPIC NEPHRECTOMY;  Surgeon: Alexis Frock, MD;  Location: [facility];  Service: Urology;  Laterality: Right;  . TOTAL ABDOMINAL HYSTERECTOMY W/ BILATERAL SALPINGOOPHORECTOMY  1989  . TOTAL KNEE ARTHROPLASTY Bilateral 1992  &  1996  . TRANSTHORACIC ECHOCARDIOGRAM  07-24-2017   dr Agustin Cree   ef 60-65% (improved from last echo 2014, was 45-50%)/  trace TR/  mild AV sclerosis without stenosis  . URETEROSCOPY Right 11/21/2013   Procedure: URETEROSCOPY WITH STENT REMOVAL AND REPLACEMENT;  Surgeon: Ailene Rud, MD;  [facility];  Service: Urology;  Laterality: Right;    Current Medications: Current Meds  Medication Sig  . acetaminophen (TYLENOL) 500 MG tablet Take 1,000 mg by mouth every 6 (six) hours as needed for moderate pain or headache.   [name] amitriptyline (ELAVIL) 25 MG tablet Take 25 mg by mouth at bedtime.  . carvedilol (COREG) 6.25 MG tablet Take 1/2 tablet 3 times daily (Patient taking differently: Take 3.125 mg by mouth in the morning, at noon, and at bedtime. Take 1/2 tablet 3 times daily)  . ELIQUIS 5 MG TABS tablet TAKE ONE TABLET BY MOUTH TWICE DAILY (Patient taking differently: Take 5 mg by mouth 2 (two) times daily.)  . flecainide (TAMBOCOR) 50 MG tablet Take 1.5 tablets (75 mg total) by mouth 2 (two) times daily.  .  hydroxyurea (HYDREA) 500 MG capsule TAKE 1 Capsule one tab po every MON, WED, FRI - TAKE FOR ELEVATED PLATELETS (Patient taking differently: Take 500 mg by mouth 3 (three) times a week. Monday's, Wednesdays and Fridays)  . omeprazole (PRILOSEC) 40 MG capsule Take 40 mg by mouth daily.   . ondansetron (ZOFRAN-ODT) 4 MG disintegrating tablet Take 4 mg by mouth every 6 (six) hours as needed for nausea/vomiting.     Allergies:   Codeine, Hydrocodone, and Macrodantin [nitrofurantoin]   Social History   Socioeconomic History  . Marital status: Married    Spouse name: Not on file  . Number of children: Not on file  . Years of education: Not on file  . Highest education level: Not on file  Occupational History  .  Occupation: Retired from [employer]  . Smoking status: Never Smoker  . Smokeless tobacco: Never Used  Vaping Use  . Vaping Use: Never used  Substance and Sexual Activity  . Alcohol use: No  . Drug use: No  . Sexual activity: Not on file  Other Topics Concern  . Not on file  Social History Narrative   Lives in [place] with husband. Drives, walks some, but not very active at home.    Social Determinants of Health   Financial Resource Strain: Not on file  Food Insecurity: Not on file  Transportation Needs: Not on file  Physical Activity: Not on file  Stress: Not on file  Social Connections: Not on file     Family History: The patient's family history includes Cancer in her mother; Heart disease in her father and mother; Heart failure in her father and mother. ROS:   Please see the history of present illness.    All 14 point review of systems negative except as described per history of present illness  EKGs/Labs/Other Studies Reviewed:      Recent Labs: 03/19/2020: Platelets 467 03/24/2020: Hemoglobin 13.1 12/28/2020: BUN 20; Creatinine, Ser 1.10; Magnesium 1.9; Potassium 4.7; Sodium 138  Recent Lipid Panel No results found for: CHOL, TRIG, HDL, CHOLHDL,  VLDL, LDLCALC, LDLDIRECT  Physical Exam:    VS:  BP 116/72 (BP Location: Right Arm, Patient Position: Sitting)   Pulse 60   Ht 5\' 7"  (1.702 m)   Wt 196 lb (88.9 kg)   SpO2 96%   BMI 30.70 kg/m     Wt Readings from Last 3 Encounters:  02/12/21 196 lb (88.9 kg)  12/28/20 198 lb (89.8 kg)  11/20/20 199 lb (90.3 kg)     GEN:  Well nourished, well developed in no acute distress HEENT: Normal NECK: No JVD; No carotid bruits LYMPHATICS: No lymphadenopathy CARDIAC: RRR, no murmurs, no rubs, no gallops RESPIRATORY:  Clear to auscultation without rales, wheezing or rhonchi  ABDOMEN: Soft, non-tender, non-distended MUSCULOSKELETAL:  No edema; No deformity  SKIN: Warm and dry LOWER EXTREMITIES: no swelling NEUROLOGIC:  Alert and oriented x 3 PSYCHIATRIC:  Normal affect   ASSESSMENT:    1. PAF (paroxysmal atrial fibrillation) ([place])   2. Coronary artery disease involving native coronary artery of native heart without angina pectoris   3. Palpitations   4. Ventricular ectopy    PLAN:    In order of problems listed above:  1. Paroxysmal atrial fibrillation, successfully suppressed with flecainide as well as beta-blocker.  Anticoagulated. 2. Coronary disease stable no recent issues. 3. Palpitations and the problem is frequent ventricular ectopy which is still uncontrolled.  She be referred to EP team.  She is already on flecainide I also try to increase dose of beta-blocker which seems not to be helping at low.  On top of that she did develop a right a first-degree AV block with PR interval of 238, therefore, I do not think we have a room for a higher dose of beta-blocker.  Actually, I will ask you to simply stop beta-blocker and see if that helps any.   Medication Adjustments/Labs and Tests Ordered: Current medicines are reviewed at length with the patient today.  Concerns regarding medicines are outlined above.  No orders of the defined types were placed in this  encounter.  Medication changes: No orders of the defined types were placed in this encounter.   Signed, Park Liter, MD, [facility] 02/12/2021 10:09 AM  [facility]

## 2021-02-12 NOTE — Addendum Note (Signed)
Addended by: Senaida Ores on: 02/12/2021 10:31 AM   Modules accepted: Orders

## 2021-02-12 NOTE — Patient Instructions (Signed)
Medication Instructions:  Your physician has recommended you make the following change in your medication:  DECREASE: carvedilol 3.125 mg to twice daily for 3 days then stop   *If you need a refill on your cardiac medications before your next appointment, please call your pharmacy*   Lab Work: None If you have labs (blood work) drawn today and your tests are completely normal, you will receive your results only by: [name] MyChart Message (if you have MyChart) OR . A paper copy in the mail If you have any lab test that is abnormal or we need to change your treatment, we will call you to review the results.   Testing/Procedures: None   Follow-Up: At [facility], you and your health needs are our priority.  As part of our continuing mission to provide you with exceptional heart care, we have created designated Provider Care Teams.  These Care Teams include your primary Cardiologist (physician) and Advanced Practice Providers (APPs -  Physician Assistants and Nurse Practitioners) who all work together to provide you with the care you need, when you need it.  We recommend signing up for the patient portal called "MyChart".  Sign up information is provided on this After Visit Summary.  MyChart is used to connect with patients for Virtual Visits (Telemedicine).  Patients are able to view lab/test results, encounter notes, upcoming appointments, etc.  Non-urgent messages can be sent to your provider as well.   To learn more about what you can do with MyChart, go to [email or web address].    Your next appointment:   1 month(s)  The format for your next appointment:   In Person  Provider:   Jenne Campus, MD   Other Instructions

## 2021-02-19 DIAGNOSIS — N3000 Acute cystitis without hematuria: Secondary | ICD-10-CM | POA: Diagnosis not present

## 2021-02-19 DIAGNOSIS — Z905 Acquired absence of kidney: Secondary | ICD-10-CM | POA: Diagnosis not present

## 2021-02-19 DIAGNOSIS — N202 Calculus of kidney with calculus of ureter: Secondary | ICD-10-CM | POA: Diagnosis not present

## 2021-02-25 ENCOUNTER — Other Ambulatory Visit: Payer: Self-pay | Admitting: Urology

## 2021-03-03 NOTE — Progress Notes (Signed)
[name]  [address] [place],  [place]  [id number] [phone]  Clinic Day:  03/04/2021  Referring physician: [name], MD   HISTORY OF PRESENT ILLNESS:  The patient is a 80 y.o. female with essential thrombocythemia, which is based upon her having an elevated platelet count and testing positive for the JAK2 mutation.  She also has a history of iron deficiency anemia.  She takes hydroxyurea 500 mg every Monday, Wednesday and Friday to keep her platelet count under 400-600.  She comes in today to reassess her peripheral counts.  Since her last visit, the patient has been doing fairly well.  She denies having any side effects from her hydroxyurea.  She also denies having any clotting complications from her underlying essential thrombocythemia.  PHYSICAL EXAM:  Blood pressure (!) 194/95, pulse 85, temperature 98.8 F (37.1 C), resp. rate 16, height 5\' 7"  (1.702 m), weight 196 lb 1.6 oz (89 kg), SpO2 94 %. Wt Readings from Last 3 Encounters:  03/04/21 196 lb 1.6 oz (89 kg)  02/12/21 196 lb (88.9 kg)  12/28/20 198 lb (89.8 kg)   Body mass index is 30.71 kg/m. Performance status (ECOG): 1 Physical Exam Constitutional:      Appearance: Normal appearance. She is not ill-appearing.  HENT:     Mouth/Throat:     Mouth: Mucous membranes are moist.     Pharynx: Oropharynx is clear. No oropharyngeal exudate or posterior oropharyngeal erythema.  Cardiovascular:     Rate and Rhythm: Normal rate and regular rhythm.     Heart sounds: No murmur heard. No friction rub. No gallop.   Pulmonary:     Effort: Pulmonary effort is normal. No respiratory distress.     Breath sounds: Normal breath sounds. No wheezing, rhonchi or rales.  Chest:  Breasts:     Right: No axillary adenopathy or supraclavicular adenopathy.     Left: No axillary adenopathy or supraclavicular adenopathy.    Abdominal:     General: Bowel sounds are normal. There is no distension.      Palpations: Abdomen is soft. There is no mass.     Tenderness: There is no abdominal tenderness.  Musculoskeletal:        General: No swelling.     Right lower leg: No edema.     Left lower leg: No edema.  Lymphadenopathy:     Cervical: No cervical adenopathy.     Upper Body:     Right upper body: No supraclavicular or axillary adenopathy.     Left upper body: No supraclavicular or axillary adenopathy.     Lower Body: No right inguinal adenopathy. No left inguinal adenopathy.  Skin:    General: Skin is warm.     Coloration: Skin is not jaundiced.     Findings: No lesion or rash.  Neurological:     General: No focal deficit present.     Mental Status: She is alert and oriented to person, place, and time. Mental status is at baseline.     Cranial Nerves: Cranial nerves are intact.  Psychiatric:        Mood and Affect: Mood normal.        Behavior: Behavior normal.        Thought Content: Thought content normal.    LABS:    Ref. Range 03/04/2021 00:00  WBC Unknown 7.5  RBC Latest Ref Range: 3.87 - 5.11  4.98  Hemoglobin Latest Ref Range: 12.0 - 16.0  14.0  HCT Latest Ref Range: 36 - 46  43  Platelets Latest Ref Range: 150 - 399  418 (A)  NEUT# Unknown 4.05   ASSESSMENT & PLAN:  Assessment/Plan:  A 80 y.o. female with essential thrombocythemia, as well as a history iron deficiency anemia.  When evaluating her labs today, I am pleased as her platelets remain well below 600.  She will continue to take hydroxyurea 500 mg every Monday, Wednesday, and Friday to keep her platelets from precipitously rising. Of note, she is on Eliquis due to her history of atrial fibrillation. This agent should also protect her from clotting complications related to her underlying essential thrombocythemia.  As she is doing well, I will see her back in 6 months for repeat clinical assessment.  The patient understands all the plans discussed today and is in agreement with them.      [name] Mooneyhan Macarthur Critchley,  MD

## 2021-03-04 ENCOUNTER — Encounter: Payer: Self-pay | Admitting: Oncology

## 2021-03-04 ENCOUNTER — Other Ambulatory Visit: Payer: Self-pay | Admitting: Hematology and Oncology

## 2021-03-04 ENCOUNTER — Other Ambulatory Visit: Payer: Self-pay

## 2021-03-04 ENCOUNTER — Inpatient Hospital Stay: Payer: Medicare Other | Attending: Oncology | Admitting: Oncology

## 2021-03-04 ENCOUNTER — Inpatient Hospital Stay: Payer: Medicare Other

## 2021-03-04 ENCOUNTER — Other Ambulatory Visit: Payer: Self-pay | Admitting: Oncology

## 2021-03-04 DIAGNOSIS — D473 Essential (hemorrhagic) thrombocythemia: Secondary | ICD-10-CM

## 2021-03-04 DIAGNOSIS — I2510 Atherosclerotic heart disease of native coronary artery without angina pectoris: Secondary | ICD-10-CM | POA: Diagnosis not present

## 2021-03-04 DIAGNOSIS — D509 Iron deficiency anemia, unspecified: Secondary | ICD-10-CM | POA: Diagnosis not present

## 2021-03-04 DIAGNOSIS — D649 Anemia, unspecified: Secondary | ICD-10-CM | POA: Diagnosis not present

## 2021-03-04 HISTORY — DX: Essential (hemorrhagic) thrombocythemia: D47.3

## 2021-03-04 LAB — CBC: RBC: 4.98 (ref 3.87–5.11)

## 2021-03-04 LAB — IRON AND TIBC
Iron: 62 ug/dL (ref 28–170)
Saturation Ratios: 15 % (ref 10.4–31.8)
TIBC: 424 ug/dL (ref 250–450)
UIBC: 362 ug/dL

## 2021-03-04 LAB — CBC AND DIFFERENTIAL
HCT: 43 (ref 36–46)
Hemoglobin: 14 (ref 12.0–16.0)
Neutrophils Absolute: 4.05
Platelets: 418 — AB (ref 150–399)
WBC: 7.5

## 2021-03-04 LAB — FERRITIN: Ferritin: 15 ng/mL (ref 11–307)

## 2021-03-05 NOTE — Progress Notes (Signed)
DUE TO COVID-19 ONLY ONE VISITOR IS ALLOWED TO COME WITH YOU AND STAY IN THE WAITING ROOM ONLY DURING PRE OP AND PROCEDURE DAY OF SURGERY. THE 1 VISITOR  MAY VISIT WITH YOU AFTER SURGERY IN YOUR PRIVATE ROOM DURING VISITING HOURS ONLY!  YOU NEED TO HAVE A COVID 19 TEST ON_______ @_______ , THIS TEST MUST BE DONE BEFORE SURGERY,  COVID TESTING SITE [address] [place] [address], IT IS ON THE RIGHT GOING OUT [address] APPROXIMATELY  2 MINUTES PAST [employer] ON THE RIGHT. ONCE YOUR COVID TEST IS COMPLETED,  PLEASE BEGIN THE QUARANTINE INSTRUCTIONS AS OUTLINED IN YOUR HANDOUT.                [name] [name]  03/05/2021   Your procedure is scheduled on:    Report to [facility] Main  Entrance   Report to admitting at AM     Call this number if you have problems the morning of surgery [phone]    Remember: Do not eat food , candy gum or mints :After Midnight. You may have clear liquids from midnight until     CLEAR LIQUID DIET   Foods Allowed                                                                       Coffee and tea, regular and decaf                              Plain Jell-O any favor except red or purple                                            Fruit ices (not with fruit pulp)                                      Iced Popsicles                                     Carbonated beverages, regular and diet                                    Cranberry, grape and apple juices Sports drinks like Gatorade Lightly seasoned clear broth or consume(fat free) Sugar, honey syrup   _____________________________________________________________________    BRUSH YOUR TEETH MORNING OF SURGERY AND RINSE YOUR MOUTH OUT, NO CHEWING GUM CANDY OR MINTS.     Take these medicines the morning of surgery with A SIP OF WATER:   DO NOT TAKE ANY DIABETIC MEDICATIONS DAY OF YOUR SURGERY                               You may not have any metal on your  body including hair pins and  piercings  Do not wear jewelry, make-up, lotions, powders or perfumes, deodorant             Do not wear nail polish on your fingernails.  Do not shave  48 hours prior to surgery.              Men may shave face and neck.   Do not bring valuables to the hospital. [place].  Contacts, dentures or bridgework may not be worn into surgery.  Leave suitcase in the car. After surgery it may be brought to your room.     Patients discharged the day of surgery will not be allowed to drive home. IF YOU ARE HAVING SURGERY AND GOING HOME THE SAME DAY, YOU MUST HAVE AN ADULT TO DRIVE YOU HOME AND BE WITH YOU FOR 24 HOURS. YOU MAY GO HOME BY TAXI OR UBER OR ORTHERWISE, BUT AN ADULT MUST ACCOMPANY YOU HOME AND STAY WITH YOU FOR 24 HOURS.  Name and phone number of your driver:  Special Instructions: N/A              Please read over the following fact sheets you were given: _____________________________________________________________________  [facility] - Preparing for Surgery Before surgery, you can play an important role.  Because skin is not sterile, your skin needs to be as free of germs as possible.  You can reduce the number of germs on your skin by washing with CHG (chlorahexidine gluconate) soap before surgery.  CHG is an antiseptic cleaner which kills germs and bonds with the skin to continue killing germs even after washing. Please DO NOT use if you have an allergy to CHG or antibacterial soaps.  If your skin becomes reddened/irritated stop using the CHG and inform your nurse when you arrive at Short Stay. Do not shave (including legs and underarms) for at least 48 hours prior to the first CHG shower.  You may shave your face/neck. Please follow these instructions carefully:  1.  Shower with CHG Soap the night before surgery and the  morning of Surgery.  2.  If you choose to wash your hair, wash your hair  first as usual with your  normal  shampoo.  3.  After you shampoo, rinse your hair and body thoroughly to remove the  shampoo.                           4.  Use CHG as you would any other liquid soap.  You can apply chg directly  to the skin and wash                       Gently with a scrungie or clean washcloth.  5.  Apply the CHG Soap to your body ONLY FROM THE NECK DOWN.   Do not use on face/ open                           Wound or open sores. Avoid contact with eyes, ears mouth and genitals (private parts).                       Wash face,  Genitals (private parts) with your normal soap.             6.  Wash thoroughly, paying special attention to the area where your surgery  will be performed.  7.  Thoroughly rinse your body with warm water from the neck down.  8.  DO NOT shower/wash with your normal soap after using and rinsing off  the CHG Soap.                9.  Pat yourself dry with a clean towel.            10.  Wear clean pajamas.            11.  Place clean sheets on your bed the night of your first shower and do not  sleep with pets. Day of Surgery : Do not apply any lotions/deodorants the morning of surgery.  Please wear clean clothes to the hospital/surgery center.  FAILURE TO FOLLOW THESE INSTRUCTIONS MAY RESULT IN THE CANCELLATION OF YOUR SURGERY PATIENT SIGNATURE_________________________________  NURSE SIGNATURE__________________________________  ________________________________________________________________________            DUE TO COVID-19 ONLY ONE VISITOR IS ALLOWED TO COME WITH YOU AND STAY IN THE WAITING ROOM ONLY DURING PRE OP AND PROCEDURE DAY OF SURGERY. THE 1 VISITOR  MAY VISIT WITH YOU AFTER SURGERY IN YOUR PRIVATE ROOM DURING VISITING HOURS ONLY!  YOU NEED TO HAVE A COVID 19 TEST ON_______ @_______ , THIS TEST MUST BE DONE BEFORE SURGERY,  COVID TESTING SITE [place] [address], IT IS ON THE RIGHT GOING OUT [address]  APPROXIMATELY  2 MINUTES PAST [employer] ON THE RIGHT. ONCE YOUR COVID TEST IS COMPLETED,  PLEASE BEGIN THE QUARANTINE INSTRUCTIONS AS OUTLINED IN YOUR HANDOUT.                [name] [name]  03/05/2021   Your procedure is scheduled on:  03/13/2021   Report to [facility] Main  Entrance   Report to admitting at    [place] AM     Call this number if you have problems the morning of surgery [phone]    Remember: Do not eat food , candy gum or mints :After Midnight. You may have clear liquids from midnight until 0815am     CLEAR LIQUID DIET   Foods Allowed                                                                       Coffee and tea, regular and decaf                              Plain Jell-O any favor except red or purple                                            Fruit ices (not with fruit pulp)                                      Iced Popsicles  Carbonated beverages, regular and diet                                    Cranberry, grape and apple juices Sports drinks like Gatorade Lightly seasoned clear broth or consume(fat free) Sugar, honey syrup   _____________________________________________________________________    BRUSH YOUR TEETH MORNING OF SURGERY AND RINSE YOUR MOUTH OUT, NO CHEWING GUM CANDY OR MINTS.     Take these medicines the morning of surgery with A SIP OF WATER:   prilosec, tambocor  DO NOT TAKE ANY DIABETIC MEDICATIONS DAY OF YOUR SURGERY                               You may not have any metal on your body including hair pins and              piercings  Do not wear jewelry, make-up, lotions, powders or perfumes, deodorant             Do not wear nail polish on your fingernails.  Do not shave  48 hours prior to surgery.              Men may shave face and neck.   Do not bring valuables to the hospital. [place].  Contacts, dentures or  bridgework may not be worn into surgery.  Leave suitcase in the car. After surgery it may be brought to your room.     Patients discharged the day of surgery will not be allowed to drive home. IF YOU ARE HAVING SURGERY AND GOING HOME THE SAME DAY, YOU MUST HAVE AN ADULT TO DRIVE YOU HOME AND BE WITH YOU FOR 24 HOURS. YOU MAY GO HOME BY TAXI OR UBER OR ORTHERWISE, BUT AN ADULT MUST ACCOMPANY YOU HOME AND STAY WITH YOU FOR 24 HOURS.  Name and phone number of your driver:  Special Instructions: N/A              Please read over the following fact sheets you were given: _____________________________________________________________________  [facility] - Preparing for Surgery Before surgery, you can play an important role.  Because skin is not sterile, your skin needs to be as free of germs as possible.  You can reduce the number of germs on your skin by washing with CHG (chlorahexidine gluconate) soap before surgery.  CHG is an antiseptic cleaner which kills germs and bonds with the skin to continue killing germs even after washing. Please DO NOT use if you have an allergy to CHG or antibacterial soaps.  If your skin becomes reddened/irritated stop using the CHG and inform your nurse when you arrive at Short Stay. Do not shave (including legs and underarms) for at least 48 hours prior to the first CHG shower.  You may shave your face/neck. Please follow these instructions carefully:  1.  Shower with CHG Soap the night before surgery and the  morning of Surgery.  2.  If you choose to wash your hair, wash your hair first as usual with your  normal  shampoo.  3.  After you shampoo, rinse your hair and body thoroughly to remove the  shampoo.  4.  Use CHG as you would any other liquid soap.  You can apply chg directly  to the skin and wash                       Gently with a scrungie or clean washcloth.  5.  Apply the CHG Soap to your body ONLY FROM THE NECK DOWN.   Do not use on  face/ open                           Wound or open sores. Avoid contact with eyes, ears mouth and genitals (private parts).                       Wash face,  Genitals (private parts) with your normal soap.             6.  Wash thoroughly, paying special attention to the area where your surgery  will be performed.  7.  Thoroughly rinse your body with warm water from the neck down.  8.  DO NOT shower/wash with your normal soap after using and rinsing off  the CHG Soap.                9.  Pat yourself dry with a clean towel.            10.  Wear clean pajamas.            11.  Place clean sheets on your bed the night of your first shower and do not  sleep with pets. Day of Surgery : Do not apply any lotions/deodorants the morning of surgery.  Please wear clean clothes to the hospital/surgery center.  FAILURE TO FOLLOW THESE INSTRUCTIONS MAY RESULT IN THE CANCELLATION OF YOUR SURGERY PATIENT SIGNATURE_________________________________  NURSE SIGNATURE__________________________________  ________________________________________________________________________

## 2021-03-07 ENCOUNTER — Other Ambulatory Visit: Payer: Self-pay

## 2021-03-07 ENCOUNTER — Encounter (HOSPITAL_COMMUNITY): Payer: Self-pay

## 2021-03-07 ENCOUNTER — Encounter (HOSPITAL_COMMUNITY)
Admission: RE | Admit: 2021-03-07 | Discharge: 2021-03-07 | Disposition: A | Discharge status: Home / Self Care | Payer: Medicare Other | Source: Ambulatory Visit | Attending: Urology | Admitting: Urology

## 2021-03-07 DIAGNOSIS — I480 Paroxysmal atrial fibrillation: Secondary | ICD-10-CM | POA: Diagnosis not present

## 2021-03-07 DIAGNOSIS — I509 Heart failure, unspecified: Secondary | ICD-10-CM | POA: Diagnosis not present

## 2021-03-07 DIAGNOSIS — I10 Essential (primary) hypertension: Secondary | ICD-10-CM | POA: Insufficient documentation

## 2021-03-07 DIAGNOSIS — N189 Chronic kidney disease, unspecified: Secondary | ICD-10-CM | POA: Insufficient documentation

## 2021-03-07 DIAGNOSIS — I130 Hypertensive heart and chronic kidney disease with heart failure and stage 1 through stage 4 chronic kidney disease, or unspecified chronic kidney disease: Secondary | ICD-10-CM | POA: Insufficient documentation

## 2021-03-07 DIAGNOSIS — Z79899 Other long term (current) drug therapy: Secondary | ICD-10-CM | POA: Diagnosis not present

## 2021-03-07 DIAGNOSIS — K219 Gastro-esophageal reflux disease without esophagitis: Secondary | ICD-10-CM | POA: Insufficient documentation

## 2021-03-07 DIAGNOSIS — T8859XA Other complications of anesthesia, initial encounter: Secondary | ICD-10-CM | POA: Insufficient documentation

## 2021-03-07 DIAGNOSIS — Z7901 Long term (current) use of anticoagulants: Secondary | ICD-10-CM | POA: Diagnosis not present

## 2021-03-07 DIAGNOSIS — C801 Malignant (primary) neoplasm, unspecified: Secondary | ICD-10-CM | POA: Insufficient documentation

## 2021-03-07 DIAGNOSIS — Z01812 Encounter for preprocedural laboratory examination: Secondary | ICD-10-CM | POA: Insufficient documentation

## 2021-03-07 DIAGNOSIS — I2510 Atherosclerotic heart disease of native coronary artery without angina pectoris: Secondary | ICD-10-CM | POA: Insufficient documentation

## 2021-03-07 DIAGNOSIS — N200 Calculus of kidney: Secondary | ICD-10-CM | POA: Diagnosis not present

## 2021-03-07 DIAGNOSIS — Z87442 Personal history of urinary calculi: Secondary | ICD-10-CM | POA: Diagnosis not present

## 2021-03-07 HISTORY — DX: Malignant (primary) neoplasm, unspecified: C80.1

## 2021-03-07 LAB — BASIC METABOLIC PANEL
Anion gap: 7 (ref 5–15)
BUN: 20 mg/dL (ref 8–23)
CO2: 24 mmol/L (ref 22–32)
Calcium: 8.8 mg/dL — ABNORMAL LOW (ref 8.9–10.3)
Chloride: 107 mmol/L (ref 98–111)
Creatinine, Ser: 1.26 mg/dL — ABNORMAL HIGH (ref 0.44–1.00)
GFR, Estimated: 43 mL/min — ABNORMAL LOW (ref >60)
Glucose, Bld: 140 mg/dL — ABNORMAL HIGH (ref 70–99)
Potassium: 3.7 mmol/L (ref 3.5–5.1)
Sodium: 138 mmol/L (ref 135–145)

## 2021-03-07 NOTE — Progress Notes (Addendum)
Anesthesia Review:  PCP:  DR Kennith Maes in [place]  Cardiologist :  DR Elmon Kirschner in [place], [place] - 02/12/2021 LOV  PEr note MD stopped beta blocker due to bradycardia at times and development of first degree heart block. Per note he will be referring pt to EP team.  Oncology- 01/02/21- LOV with Anders Grant, MD for thrombocytopenia  Chest x-ray : EKG :02/12/21 Echo :02/11/2020  Stress test: Cardiac Cath :  Activity level: cannot do a flight of stairs without difficulty  Sleep Study/ CPAP : none Fasting Blood Sugar :      / Checks Blood Sugar -- times a day:   Blood Thinner/ Instructions /Last Dose: ASA / Instructions/ Last Dose :  Eliquis- do not stop per pt per MD  03/04/2021- CBC done - in epic.   PT is scheduled for Ambulatory surgery on  03/13/2021.  PT has no S/s of Covid at preop appt.  Surgeon did not order preprocedure covid test.

## 2021-03-08 ENCOUNTER — Telehealth: Payer: Self-pay | Admitting: Cardiology

## 2021-03-08 NOTE — Telephone Encounter (Signed)
   Name: Declan Adamson [facility]  DOB: [birth year]  MRN: [id number]   Primary Cardiologist: Jenne Campus, MD  Chart reviewed as part of pre-operative protocol coverage. Patient was contacted 03/08/2021 in reference to pre-operative risk assessment for pending surgery as outlined below.  Arriyanna Mersch Schepers was last seen on 02/12/21 by Dr. Agustin Cree.  Since that day, Cherita Hebel Cuccaro has done well. She tells me her heart rate is much improved since stopping Diltiazem and Carvedilol. Her heart rate has mostly been in the 70s. A few times she had a low heart rate <40bpm but this has been rare and overall not bothersome. Tells me she has not had any problems and feels well.   Therefore, based on ACC/AHA guidelines, the patient would be at acceptable risk for the planned procedure without further cardiovascular testing.   The patient was advised that if she develops new symptoms prior to surgery to contact our office to arrange for a follow-up visit, and she verbalized understanding.  I will route this recommendation to the requesting party via Epic fax function and remove from pre-op pool. Please call with questions.  Loel Dubonnet, NP 03/08/2021, 3:12 PM

## 2021-03-08 NOTE — Progress Notes (Addendum)
Anesthesia Chart Review   Case: [id number] Date/Time: 03/13/21 1015   Procedures:      FIRST STAGE: CYSTOSCOPY WITH RETROGRADE PYELOGRAM, URETEROSCOPY AND STENT PLACEMENT (Left ) - 40 MINS     HOLMIUM LASER APPLICATION (Left )   Anesthesia type: Choice   Pre-op diagnosis: LEFT RENAL STONE   Location: [facility] 06 / [facility]   Surgeons: Alexis Frock, MD      DISCUSSION:80 y.o. never smoker with h/o GERD, HTN, PAF, CAD, CHF, CKD, left renal stone scheduled for above procedure 03/13/2021 with Dr. Alexis Frock.   Per previous anesthesia note, "(h/o difficult intubation for sepsis in [place] ED in 2014; intubation note for cysto in 2017 shows 1 attempt with glidescope; several procedures done successfully with LMA)"  Pt last seen by cardiology 02/12/2021. CAD stable, asymptomatic.  At this visit referred to EP due to palpitations with frequent ventricular ectopy.  Cardiac clearance requested.   Addendum 03/12/2021:  Per cardiology preoperative evaluation 03/08/2021, "Chart reviewed as part of pre-operative protocol coverage. Patient was contacted 03/08/2021 in reference to pre-operative risk assessment for pending surgery as outlined below.  [name] [name] was last seen on 02/12/21 by Dr. Agustin Cree.  Since that day, [name] [name] has done well. She tells me her heart rate is much improved since stopping Diltiazem and Carvedilol. Her heart rate has mostly been in the 70s. A few times she had a low heart rate <40bpm but this has been rare and overall not bothersome. Tells me she has not had any problems and feels well.   Therefore, based on ACC/AHA guidelines, the patient would be at acceptable risk for the planned procedure without further cardiovascular testing."  Anticipate pt can proceed with planned procedure barring acute status change.   VS: BP (!) 159/79   Pulse 81   Temp 36.9 C (Oral)   Resp 16   Ht 5\' 7"  (1.702 m)   Wt 86.2 kg   SpO2 98%   BMI 29.76 kg/m   PROVIDERS: [name], MD is PCP   Tristan Schroeder, MD is Cardiologist  LABS: Labs reviewed: Acceptable for surgery. (all labs ordered are listed, but only abnormal results are displayed)  Labs Reviewed  BASIC METABOLIC PANEL - Abnormal; Notable for the following components:      Result Value   Glucose, Bld 140 (*)    Creatinine, Ser 1.26 (*)    Calcium 8.8 (*)    GFR, Estimated 43 (*)    All other components within normal limits     IMAGES:   EKG: 02/12/2021 Rate 69 bpm  Sinus rhythm with 1st degree AV block   CV: Echo 02/01/2020 1. Left ventricular ejection fraction, by estimation, is 60 to 65%. The  left ventricle has normal function. The left ventricle has no regional  wall motion abnormalities. Left ventricular diastolic parameters are  consistent with Grade I diastolic  dysfunction (impaired relaxation).  2. Right ventricular systolic function is normal. The right ventricular  size is normal.  3. The mitral valve is normal in structure. No evidence of mitral valve  regurgitation. No evidence of mitral stenosis.  4. The aortic valve is normal in structure. Aortic valve regurgitation is  not visualized. No aortic stenosis is present.  5. There is mild dilatation of the ascending aorta measuring 38 mm.  6. The inferior vena cava is normal in size with greater than 50%  respiratory variability, suggesting right atrial pressure of 3 mmHg. Past Medical History:  Diagnosis Date  . Abdominal aortic atherosclerosis ([place]) 05/07/2016   Noted on CT abd/pelvis  . Acute renal failure ([place]) 06/04/2013  . Acute respiratory failure with hypoxia ([place]) 06/04/2013  . Anemia, unspecified 06/08/2013  . Anticoagulant long-term use    ELIQUIS  . [facility])    uterine cancer   . Chronic anticoagulation 08/04/2017  . Chronic diastolic (congestive) heart failure (HCC)     pt denies   . Chronic diastolic congestive heart failure ([place]) 05/31/2015  . Chronic kidney disease 2014   arf on dialysis  in icu  . Complication of anesthesia    2021- kidney removed at [facility] pt reports did not get enough med to wake her up and she could not talk and then recovered after given more of wake up med from anesthesia   . Congenital vaginal enterocele 04/15/2016  . Coronary artery disease   . Cystocele, midline    followed by dr c. Marvel [facility] in [place])  pt uses pessary  . Diarrhea 06/11/2013  . Diverticulosis 05/07/2016   Noted on CT abd/pelvis  . Dyslipidemia 05/31/2015  . E coli bacteremia 06/08/2013  . Encounter for monitoring flecainide therapy 07/30/2016  . Essential thrombocythemia ([place]) 03/04/2021  . Female genital prolapse 04/15/2016  . First degree heart block   . General weakness   . GERD (gastroesophageal reflux disease)   . Hematuria    intermittently due to ureter right stent  . Hiatal hernia   . High risk medication use 04/15/2016  . History of arteriovenostomy for renal dialysis ([place])   . History of colon polyps   . History of gastric polyp   . History of kidney infection    12-31-2018  perinephrtic abscess  right side  s/p drains x2 01-10-2019,  05/ 2020 drains removed  . History of kidney stones   . History of peptic ulcer disease   . History of septic shock    2011 (pt unaware) and 06-04-2013  w/ acute renal failure  . History of uterine cancer    STAGE I  --  S/P TAH W/ BSO  (NO OTHER TX)  . History of ventilator dependency ([place]) 2014   in setting of septic shock  . Hydronephrosis 05/07/2016  . Hyperlipemia 04/15/2016  . Hyperlipidemia   . Hypertension   . Iron deficiency anemia hematology/ oncology--- dr Bobby Rumpf at [facility] in [place]--- per lov note 08-07-2017  stabilized and felt to be more chronic anemia   01/ 2018  dx iron def. anemia  s/p  IV Iron infusion and taking oral iron supplement  . Lactic acidosis 06/04/2013  . Lumbar compression fracture ([place])    01-22-2019 per imaging L1  (06-01-2019 per pt currently no pain)  .  Midline cystocele 04/15/2016  . Nonfunctioning kidney 03/23/2020  . NSTEMI, initial episode of care ([place]) 06/07/2013   pt denies   . Occlusion of ureteral stent ([place])   . PAF (paroxysmal atrial fibrillation) ([place]) DX JUNE 2012   CARDIOLOGIST--  DR Vernie Shanks--- [facility])  CHADS2  . Palpitations 04/15/2016  . Paroxysmal atrial fibrillation ([place]) 08/04/2017  . Presence of pessary   . Pyelonephritis 06/04/2013  . Right wrist fracture    per pt fractured on 03-10-2019,  had closed reduction and wearing a brace  . Sepsis ([place]) 05/07/2016  . Septic shock(785.52) 06/04/2013  . Tendonitis, Achilles 04/15/2016  . Ureteral obstruction, right 06/04/2013  . Ureteral stricture, right urologist-- dr Tresa Moore  Chronic--- treated with ureteral stent  . Urinary tract infection without hematuria   . Uses walker    and wheelchair for longer distance  . Ventricular ectopy 12/28/2020  . Wears glasses     Past Surgical History:  Procedure Laterality Date  . APPENDECTOMY    . BACK SURGERY    . CARDIOVERSION  09/ 2018   dr Agustin Cree   successful ([facility] )  . CHOLECYSTECTOMY  2010  . COLONOSCOPY W/ POLYPECTOMY  05/2017  . CYSTO/  BALLOON DILATION RIGHT URETERAL STRICTURE/ STENT PLACEMENT  06-02-2013  . CYSTO/  BILATERAL RETROGRADE PYELOGRAM/ RIGHT URETEROSCOPY AND STENT PLACEMENT  10-04-2005  . CYSTO/ LEFT URETEROSCOPIC STONE EXTRACTION  04-03-2006  . CYSTOSCOPY W/ RETROGRADES Right 01/03/2019   Procedure: CYSTOSCOPY WITH RETROGRADE PYELOGRAM RIGHT WITH STENT EXCHANGE;  Surgeon: Irine Seal, MD;  [facility];  Service: Urology;  Laterality: Right;  . CYSTOSCOPY W/ URETERAL STENT PLACEMENT Right 02/06/2014   Procedure: CYSTOSCOPY WITH RIGHT RETROGRADE PYELOGRAM RIGHT STENT REMOVAL and REPLACEMENT, Right Ureteroscopy;  Surgeon: Ailene Rud, MD;  [facility];  Service: Urology;  Laterality: Right;  . CYSTOSCOPY W/ URETERAL STENT PLACEMENT Right 11/26/2016    Procedure: CYSTOSCOPY WITH RETROGRADE PYELOGRAM/URETERAL STENT REPLACEMENT;  Surgeon: Alexis Frock, MD;  Location: [facility];  Service: Urology;  Laterality: Right;  . CYSTOSCOPY W/ URETERAL STENT PLACEMENT Right 04/24/2017   Procedure: CYSTOSCOPY WITH RETROGRADE PYELOGRAM/URETERAL STENT REPLACEMENT;  Surgeon: Alexis Frock, MD;  Location: [facility];  Service: Urology;  Laterality: Right;  . CYSTOSCOPY W/ URETERAL STENT PLACEMENT Right 10/07/2017   Procedure: CYSTOSCOPY WITH RETROGRADE PYELOGRAM/URETERAL STENT EXCHANGE;  Surgeon: Alexis Frock, MD;  Location: [facility];  Service: Urology;  Laterality: Right;  . CYSTOSCOPY W/ URETERAL STENT PLACEMENT Right 03/03/2018   Procedure: CYSTOSCOPY WITH RIGHT RETROGRADE / RIGHT URETERAL STENT EXCHANGE;  Surgeon: Alexis Frock, MD;  [facility];  Service: Urology;  Laterality: Right;  . CYSTOSCOPY W/ URETERAL STENT PLACEMENT Right 09/22/2018   Procedure: CYSTOSCOPY WITH RETROGRADE PYELOGRAM/URETERAL STENT PLACEMENT;  Surgeon: Alexis Frock, MD;  Location: [facility];  Service: Urology;  Laterality: Right;  45 MINS  . CYSTOSCOPY W/ URETERAL STENT PLACEMENT Right 06/03/2019   Procedure: CYSTOSCOPY WITH RETROGRADE PYELOGRAM/URETERAL STENT REPLACEMENT;  Surgeon: Alexis Frock, MD;  Location: [facility];  Service: Urology;  Laterality: Right;  . CYSTOSCOPY W/ URETERAL STENT PLACEMENT Right 11/23/2019   Procedure: CYSTOSCOPY WITH RETROGRADE PYELOGRAM/URETERAL STENT PLACEMENT;  Surgeon: Alexis Frock, MD;  Location: [facility];  Service: Urology;  Laterality: Right;  45 MINS  . CYSTOSCOPY WITH LITHOLAPAXY N/A 11/21/2013   Procedure: CYSTOSCOPY WITH LITHOLAPAXY;  Surgeon: Ailene Rud, MD;  Location: [facility];  Service: Urology;  Laterality: N/A;  . CYSTOSCOPY WITH RETROGRADE PYELOGRAM, URETEROSCOPY AND STENT PLACEMENT Bilateral  03/15/2014   Procedure: CYSTOSCOPY WITH BILATERAL RETROGRADE PYELOGRAM, RIGHT DIAGNOSTIC URETEROSCOPY AND STENT EXCHANGE;  Surgeon: Alexis Frock, MD;  [facility];  Service: Urology;  Laterality: Bilateral;  . CYSTOSCOPY WITH STENT PLACEMENT Right 05/07/2016   Procedure: CYSTOSCOPY WITH RETROGRADE PYELOGRAM, URETERAL STENT PLACEMENT;  Surgeon: Franchot Gallo, MD;  [facility];  Service: Urology;  Laterality: Right;  . EYE SURGERY     Bilateral cataract removal  . HEMIARTHROPLASTY HIP Right 11/2016   fractured  . IR CATHETER TUBE CHANGE  02/04/2019  . IR CATHETER TUBE CHANGE  02/04/2019  . IR CATHETER TUBE CHANGE  02/11/2019  . IR  RADIOLOGIST EVAL & MGMT  03/09/2019  . JOINT REPLACEMENT    . LUMBAR FUSION  JULY 2013  . NEPHROLITHOTOMY  X2 YRS AGO  . PERCUTANEOUS NEPHROSTOMY  12/2018  . ROBOT ASSISTED LAPAROSCOPIC NEPHRECTOMY Right 03/23/2020   Procedure: XI ROBOTIC ASSISTED LAPAROSCOPIC NEPHRECTOMY;  Surgeon: Alexis Frock, MD;  [facility];  Service: Urology;  Laterality: Right;  . TOTAL ABDOMINAL HYSTERECTOMY W/ BILATERAL SALPINGOOPHORECTOMY  1989  . TOTAL KNEE ARTHROPLASTY Bilateral 1992  &  1996  . TRANSTHORACIC ECHOCARDIOGRAM  07-24-2017   dr Agustin Cree   ef 60-65% (improved from last echo 2014, was 45-50%)/  trace TR/  mild AV sclerosis without stenosis  . URETEROSCOPY Right 11/21/2013   Procedure: URETEROSCOPY WITH STENT REMOVAL AND REPLACEMENT;  Surgeon: Ailene Rud, MD;  Location[facility];  Service: Urology;  Laterality: Right;    MEDICATIONS: . acetaminophen (TYLENOL) 500 MG tablet  . amitriptyline (ELAVIL) 25 MG tablet  . carvedilol (COREG) 6.25 MG tablet  . ELIQUIS 5 MG TABS tablet  . flecainide (TAMBOCOR) 50 MG tablet  . hydroxyurea (HYDREA) 500 MG capsule  . omeprazole (PRILOSEC) 40 MG capsule  . ondansetron (ZOFRAN-ODT) 4 MG disintegrating tablet  . triamcinolone cream (KENALOG) 0.1 %   No current facility-administered  medications for this encounter.    Konrad Felix, PA-C [facility] [phone]

## 2021-03-08 NOTE — Telephone Encounter (Signed)
       [facility] Pre-operative Risk Assessment    Patient Name: [name] [name] [facility]  DOB: 08-31-1941  MRN: [id number]   HEARTCARE STAFF: - Please ensure there is not already an duplicate clearance open for this procedure. - Under Visit Info/Reason for Call, type in Other and utilize the format Clearance MM/DD/YY or Clearance TBD. Do not use dashes or single digits. - If request is for dental extraction, please clarify the # of teeth to be extracted.  Request for surgical clearance:  1. What type of surgery is being performed? 2 stage Ureteroscopy  2. When is this surgery scheduled? 03/13/21  3. What type of clearance is required (medical clearance vs. Pharmacy clearance to hold med vs. Both)? Medical  4. Are there any medications that need to be held prior to surgery and how long? No needs to stop any meds  5. Practice name and name of physician performing surgery? Dr. Tresa Moore - [facility]  6. What is the office phone number? [phone] ext [phone]   7.   What is the office fax number? [phone]  8.   Anesthesia type (None, local, MAC, general) ? Choice    [name] [name] 03/08/2021, 2:47 PM  _________________________________________________________________   (provider comments below)

## 2021-03-08 NOTE — Telephone Encounter (Signed)
   Name: Reyanne Hussar [facility]  DOB: [birth year]  MRN: [id number]   Primary Cardiologist: Jenne Campus, MD  Chart reviewed as part of pre-operative protocol coverage.   Left VM requesting call back.  Loel Dubonnet, NP 03/08/2021, 3:00 PM

## 2021-03-12 MED ORDER — GENTAMICIN SULFATE 40 MG/ML IJ SOLN
360.0000 mg | INTRAVENOUS | Status: AC
  Administered 2021-03-13: 360 mg via INTRAVENOUS
  Filled 2021-03-12: qty 9

## 2021-03-12 NOTE — Anesthesia Preprocedure Evaluation (Addendum)
Anesthesia Evaluation  Patient identified by MRN, date of birth, ID band Patient awake    Reviewed: Allergy & Precautions, H&P , NPO status , Patient's Chart, lab work & pertinent test results  Airway Mallampati: II   Neck ROM: full    Dental   Pulmonary neg pulmonary ROS,    breath sounds clear to auscultation       Cardiovascular hypertension, + CAD and +CHF  + dysrhythmias Atrial Fibrillation  Rhythm:regular Rate:Normal     Neuro/Psych    GI/Hepatic GERD  ,  Endo/Other    Renal/GU Renal diseasestones     Musculoskeletal   Abdominal   Peds  Hematology   Anesthesia Other Findings   Reproductive/Obstetrics                            Anesthesia Physical Anesthesia Plan  ASA: III  Anesthesia Plan: General   Post-op Pain Management:    Induction: Intravenous  PONV Risk Score and Plan: 3 and Ondansetron, Dexamethasone and Treatment may vary due to age or medical condition  Airway Management Planned: LMA  Additional Equipment:   Intra-op Plan:   Post-operative Plan: Extubation in OR  Informed Consent: I have reviewed the patients History and Physical, chart, labs and discussed the procedure including the risks, benefits and alternatives for the proposed anesthesia with the patient or authorized representative who has indicated his/her understanding and acceptance.     Dental advisory given  Plan Discussed with: CRNA, Anesthesiologist and Surgeon  Anesthesia Plan Comments: (See PAT note 03/07/2021, Konrad Felix, PA-C)       Anesthesia Quick Evaluation

## 2021-03-13 ENCOUNTER — Ambulatory Visit (HOSPITAL_COMMUNITY): Payer: Medicare Other | Admitting: Physician Assistant

## 2021-03-13 ENCOUNTER — Encounter (HOSPITAL_COMMUNITY)
Admission: RE | Disposition: A | Discharge status: Home / Self Care | Payer: Self-pay | Source: Home / Self Care | Attending: Urology

## 2021-03-13 ENCOUNTER — Encounter (HOSPITAL_COMMUNITY): Payer: Self-pay | Admitting: Urology

## 2021-03-13 ENCOUNTER — Ambulatory Visit (HOSPITAL_COMMUNITY): Payer: Medicare Other

## 2021-03-13 ENCOUNTER — Ambulatory Visit (HOSPITAL_COMMUNITY): Payer: Medicare Other | Admitting: Certified Registered"

## 2021-03-13 ENCOUNTER — Ambulatory Visit (HOSPITAL_COMMUNITY)
Admission: RE | Admit: 2021-03-13 | Discharge: 2021-03-13 | Disposition: A | Discharge status: Home / Self Care | Payer: Medicare Other | Attending: Urology | Admitting: Urology

## 2021-03-13 DIAGNOSIS — Z87440 Personal history of urinary (tract) infections: Secondary | ICD-10-CM | POA: Insufficient documentation

## 2021-03-13 DIAGNOSIS — I110 Hypertensive heart disease with heart failure: Secondary | ICD-10-CM | POA: Diagnosis not present

## 2021-03-13 DIAGNOSIS — Z9115 Patient's noncompliance with renal dialysis: Secondary | ICD-10-CM | POA: Insufficient documentation

## 2021-03-13 DIAGNOSIS — Z7901 Long term (current) use of anticoagulants: Secondary | ICD-10-CM | POA: Diagnosis not present

## 2021-03-13 DIAGNOSIS — I130 Hypertensive heart and chronic kidney disease with heart failure and stage 1 through stage 4 chronic kidney disease, or unspecified chronic kidney disease: Secondary | ICD-10-CM | POA: Insufficient documentation

## 2021-03-13 DIAGNOSIS — N132 Hydronephrosis with renal and ureteral calculous obstruction: Secondary | ICD-10-CM | POA: Insufficient documentation

## 2021-03-13 DIAGNOSIS — Z885 Allergy status to narcotic agent status: Secondary | ICD-10-CM | POA: Insufficient documentation

## 2021-03-13 DIAGNOSIS — I480 Paroxysmal atrial fibrillation: Secondary | ICD-10-CM | POA: Diagnosis not present

## 2021-03-13 DIAGNOSIS — Z992 Dependence on renal dialysis: Secondary | ICD-10-CM | POA: Insufficient documentation

## 2021-03-13 DIAGNOSIS — Z8249 Family history of ischemic heart disease and other diseases of the circulatory system: Secondary | ICD-10-CM | POA: Diagnosis not present

## 2021-03-13 DIAGNOSIS — Z905 Acquired absence of kidney: Secondary | ICD-10-CM | POA: Insufficient documentation

## 2021-03-13 DIAGNOSIS — N189 Chronic kidney disease, unspecified: Secondary | ICD-10-CM | POA: Diagnosis not present

## 2021-03-13 DIAGNOSIS — N200 Calculus of kidney: Secondary | ICD-10-CM | POA: Diagnosis not present

## 2021-03-13 DIAGNOSIS — I5032 Chronic diastolic (congestive) heart failure: Secondary | ICD-10-CM | POA: Insufficient documentation

## 2021-03-13 HISTORY — PX: HOLMIUM LASER APPLICATION: SHX5852

## 2021-03-13 HISTORY — PX: CYSTOSCOPY WITH RETROGRADE PYELOGRAM, URETEROSCOPY AND STENT PLACEMENT: SHX5789

## 2021-03-13 LAB — CBC
HCT: 45.8 % (ref 36.0–46.0)
Hemoglobin: 14.4 g/dL (ref 12.0–15.0)
MCH: 28 pg (ref 26.0–34.0)
MCHC: 31.4 g/dL (ref 30.0–36.0)
MCV: 88.9 fL (ref 80.0–100.0)
Platelets: 476 10*3/uL — ABNORMAL HIGH (ref 150–400)
RBC: 5.15 MIL/uL — ABNORMAL HIGH (ref 3.87–5.11)
RDW: 15.5 % (ref 11.5–15.5)
WBC: 8.8 10*3/uL (ref 4.0–10.5)
nRBC: 0 % (ref 0.0–0.2)

## 2021-03-13 SURGERY — CYSTOURETEROSCOPY, WITH RETROGRADE PYELOGRAM AND STENT INSERTION
Anesthesia: General | Laterality: Left

## 2021-03-13 MED ORDER — LIDOCAINE 2% (20 MG/ML) 5 ML SYRINGE
INTRAMUSCULAR | Status: AC
  Filled 2021-03-13: qty 5

## 2021-03-13 MED ORDER — PROPOFOL 10 MG/ML IV BOLUS
INTRAVENOUS | Status: DC | PRN
  Administered 2021-03-13: 130 mg via INTRAVENOUS

## 2021-03-13 MED ORDER — LIDOCAINE 2% (20 MG/ML) 5 ML SYRINGE
INTRAMUSCULAR | Status: DC | PRN
  Administered 2021-03-13: 60 mg via INTRAVENOUS

## 2021-03-13 MED ORDER — DIATRIZOATE MEGLUMINE 30 % UR SOLN
URETHRAL | Status: AC
  Filled 2021-03-13: qty 100

## 2021-03-13 MED ORDER — PROPOFOL 10 MG/ML IV BOLUS
INTRAVENOUS | Status: AC
  Filled 2021-03-13: qty 20

## 2021-03-13 MED ORDER — CHLORHEXIDINE GLUCONATE 0.12 % MT SOLN
15.0000 mL | Freq: Once | OROMUCOSAL | Status: AC
  Administered 2021-03-13: 15 mL via OROMUCOSAL

## 2021-03-13 MED ORDER — NITROFURANTOIN MONOHYD MACRO 100 MG PO CAPS: 100.0000 mg | ORAL_CAPSULE | Freq: Two times a day (BID) | ORAL | 0 refills | Status: AC

## 2021-03-13 MED ORDER — ONDANSETRON HCL 4 MG/2ML IJ SOLN: 4.0000 mg | Freq: Four times a day (QID) | INTRAMUSCULAR | Status: DC | PRN

## 2021-03-13 MED ORDER — OXYCODONE HCL 5 MG/5ML PO SOLN: 5.0000 mg | Freq: Once | ORAL | Status: DC | PRN

## 2021-03-13 MED ORDER — FENTANYL CITRATE (PF) 100 MCG/2ML IJ SOLN
INTRAMUSCULAR | Status: AC
  Filled 2021-03-13: qty 2

## 2021-03-13 MED ORDER — DIATRIZOATE MEGLUMINE 30 % UR SOLN
URETHRAL | Status: DC | PRN
  Administered 2021-03-13: 20 mL via URETHRAL

## 2021-03-13 MED ORDER — ONDANSETRON HCL 4 MG/2ML IJ SOLN
INTRAMUSCULAR | Status: DC | PRN
  Administered 2021-03-13: 4 mg via INTRAVENOUS

## 2021-03-13 MED ORDER — LACTATED RINGERS IV SOLN: INTRAVENOUS | Status: DC

## 2021-03-13 MED ORDER — OXYCODONE HCL 5 MG PO TABS: 5.0000 mg | ORAL_TABLET | Freq: Once | ORAL | Status: DC | PRN

## 2021-03-13 MED ORDER — DEXAMETHASONE SODIUM PHOSPHATE 10 MG/ML IJ SOLN
INTRAMUSCULAR | Status: DC | PRN
  Administered 2021-03-13: 4 mg via INTRAVENOUS

## 2021-03-13 MED ORDER — FENTANYL CITRATE (PF) 100 MCG/2ML IJ SOLN
INTRAMUSCULAR | Status: DC | PRN
  Administered 2021-03-13: 50 ug via INTRAVENOUS

## 2021-03-13 MED ORDER — FENTANYL CITRATE (PF) 100 MCG/2ML IJ SOLN: 25.0000 ug | INTRAMUSCULAR | Status: DC | PRN

## 2021-03-13 MED ORDER — SODIUM CHLORIDE 0.9 % IR SOLN
Status: DC | PRN
  Administered 2021-03-13 (×2): 3000 mL via INTRAVESICAL

## 2021-03-13 MED ORDER — TRAMADOL HCL 50 MG PO TABS: 50.0000 mg | ORAL_TABLET | Freq: Four times a day (QID) | ORAL | 0 refills | Status: AC | PRN

## 2021-03-13 MED ORDER — DEXAMETHASONE SODIUM PHOSPHATE 10 MG/ML IJ SOLN
INTRAMUSCULAR | Status: AC
  Filled 2021-03-13: qty 1

## 2021-03-13 MED ORDER — ONDANSETRON HCL 4 MG/2ML IJ SOLN
INTRAMUSCULAR | Status: AC
  Filled 2021-03-13: qty 2

## 2021-03-13 MED ORDER — ORAL CARE MOUTH RINSE: 15.0000 mL | Freq: Once | OROMUCOSAL | Status: AC

## 2021-03-13 SURGICAL SUPPLY — 24 items
BAG URO CATCHER STRL LF (MISCELLANEOUS) ×2 IMPLANT
BASKET LASER NITINOL 1.9FR (BASKET) IMPLANT
BASKET STONE NCOMPASS (UROLOGICAL SUPPLIES) ×2 IMPLANT
CATH INTERMIT  6FR 70CM (CATHETERS) ×2 IMPLANT
CLOTH BEACON ORANGE TIMEOUT ST (SAFETY) ×2 IMPLANT
EXTRACTOR STONE 1.7FRX115CM (UROLOGICAL SUPPLIES) IMPLANT
GLOVE SURG ENC TEXT LTX SZ7.5 (GLOVE) ×2 IMPLANT
GOWN STRL REUS W/TWL LRG LVL3 (GOWN DISPOSABLE) ×2 IMPLANT
GUIDEWIRE ANG ZIPWIRE 038X150 (WIRE) ×2 IMPLANT
GUIDEWIRE STR DUAL SENSOR (WIRE) ×2 IMPLANT
KIT TURNOVER KIT A (KITS) ×2 IMPLANT
LASER FIB FLEXIVA PULSE ID 365 (Laser) IMPLANT
LASER FIB FLEXIVA PULSE ID 550 (Laser) IMPLANT
LASER FIB FLEXIVA PULSE ID 910 (Laser) IMPLANT
MANIFOLD NEPTUNE II (INSTRUMENTS) ×2 IMPLANT
PACK CYSTO (CUSTOM PROCEDURE TRAY) ×2 IMPLANT
SHEATH URETERAL 12FRX28CM (UROLOGICAL SUPPLIES) ×2 IMPLANT
SHEATH URETERAL 12FRX35CM (MISCELLANEOUS) IMPLANT
STENT URET 6FRX24 CONTOUR (STENTS) ×2 IMPLANT
TRACTIP FLEXIVA PULS ID 200XHI (Laser) IMPLANT
TRACTIP FLEXIVA PULSE ID 200 (Laser) ×2 IMPLANT
TUBE FEEDING 8FR 16IN STR KANG (MISCELLANEOUS) ×2 IMPLANT
TUBING CONNECTING 10 (TUBING) ×2 IMPLANT
TUBING UROLOGY SET (TUBING) ×2 IMPLANT

## 2021-03-13 NOTE — Op Note (Signed)
[name] [name], FLYNN MEDICAL RECORD NO: [id number] ACCOUNT NO: [id number] DATE OF BIRTH: Feb 24, 1941 FACILITY: [facility] LOCATION: [facility] PHYSICIAN: Alexis Frock, MD  Operative Report   DATE OF PROCEDURE: 03/13/2021  PREOPERATIVE DIAGNOSIS:  Recurrent left large renal stone, solitary kidney, history of recurrent urinary tract infections.  PROCEDURE PERFORMED: 1.  Cystoscopy, left retrograde pyelogram, interpretation. 2.  First stage left ureteroscopy with laser lithotripsy and stent placement.  FINDINGS:   1.  Large volume, very soft, mostly proteinaceous infectious appearing lower pole stone. 2.  Ablation, irrigation of estimated 70-75% of the left lower pole stone. 3.  Placement of left ureteral stent.  Proximal end in the renal pelvis, distal end in the urinary bladder.  INDICATIONS:  The patient is a pleasant, but unfortunate 80 year old lady with a very complex medical, surgical history.  She is status post right nephrectomy for an atrophic kidney with recurrent infections several years ago and initially did quite well  from an infectious perspective for a number of years.  She unfortunately developed a pattern of recurrent infections and continues to empty well, but has developed a left lower pole early partial staghorn calculus without obstruction.  This is highly  concerning for future obstruction, solitary kidney and potential nidus of her recurrent infections.  Options were discussed including recommended path of staged ureteroscopy with goal of stone free.  She wished to proceed.  She has been cultured and on  systemic antibiotics preoperatively to reduce colonization.  Informed consent was then placed in the medical record.  PROCEDURE IN DETAIL:  The patient being verified, procedure being left first stage ureteroscopic stimulation was confirmed.  Procedure timeout was performed.  Intravenous antibiotics were administered.  General LMA anesthesia was induced.  The patient   placed in the low lithotomy position.  A sterile field was created, prepped and draped the patient's vagina, introitus and proximal thighs using iodine.  A cystourethroscopy was performed with a 21-french rigid cystoscope with offset lens.  Inspection of  the bladder revealed no diverticula or calcifications, papillary lesions.  The left ureteral orifice was cannulated with a 6-French renal catheter and left retrograde pyelogram was obtained.  Left retrograde pyelogram demonstrated single left ureter, single system left kidney.  Questionable filling defect in the lower pole area consistent with known stone.  No hydronephrosis.  A 0.038 ZIPwire was placed at the left upper pole, set aside as a  safety wire.  An 8-French feeding tube placed in the urinary bladder for pressure release.  Next, semirigid ureteroscopy was performed of the distal four-fifths of the left ureter alongside as a separate sensor working wire, no mucosal abnormalities were  found.  Next semirigid scope was changed to a size 12/14 short length ureteral access sheath at the level of the mid ureter using continuous fluoroscopic guidance, a flexible digital ureteroscope was performed of the proximal left ureter and systematic  inspection of the left kidney, including all calices x3.  As anticipated, there is fairly large volume lower pole renal stone with some branching. No acute obstruction consistent with a partial staghorn stone.  This was quite light colored and friable  consistent with likely proteinaceous and infectious etiology.  Holmium laser energy was applied using 0.2 joules and 20 Hz and using dusting technique, vast majority of the stone was amenable to dusting, estimated approximately 70-75%, at which point,  visualization became quite poor given a significant amount of debris apparently generated.  The kidney was carefully irrigated.  Taken exquisite care to avoid over  pressurization via the ureteroscope and multiple 20 mL  aliquots and the vast majority of  the debris was irrigated. We achieved, the goal of the first stage of procedure today.  Although placing an interval stent to allow additional debris to possibly clear and proceeding as planned with second stage surgery several weeks will be most  prudent. Access sheath was removed under continuous vision.  No significant mucosal abnormalities were found.  A new 6 x 24 Contour type stent placed as a safety wire using fluoroscopic guidance.  Good proximal and distal planes were noted.  Procedure  was terminated.  The patient tolerated the procedure well and no immediate complications.  The patient was taken to postanesthesia care in stable condition.  Plan for discharge home.   PUS D: 03/13/2021 11:23:28 am T: 03/13/2021 8:04:00 pm  JOB: [id number]/ [id number]

## 2021-03-13 NOTE — Discharge Instructions (Signed)
1 - You may have urinary urgency (bladder spasms) and bloody urine on / off with stent in place. This is normal.  2 - Call MD or go to ER for fever >102, severe pain / nausea / vomiting not relieved by medications, or acute change in medical status   General Anesthesia, Adult, Care After This sheet gives you information about how to care for yourself after your procedure. Your health care provider may also give you more specific instructions. If you have problems or questions, contact your health care provider. What can I expect after the procedure? After the procedure, the following side effects are common:  Pain or discomfort at the IV site.  Nausea.  Vomiting.  Sore throat.  Trouble concentrating.  Feeling cold or chills.  Feeling weak or tired.  Sleepiness and fatigue.  Soreness and body aches. These side effects can affect parts of the body that were not involved in surgery. Follow these instructions at home: For the time period you were told by your health care provider:  Rest.  Do not participate in activities where you could fall or become injured.  Do not drive or use machinery.  Do not drink alcohol.  Do not take sleeping pills or medicines that cause drowsiness.  Do not make important decisions or sign legal documents.  Do not take care of children on your own.   Eating and drinking  Follow any instructions from your health care provider about eating or drinking restrictions.  When you feel hungry, start by eating small amounts of foods that are soft and easy to digest (bland), such as toast. Gradually return to your regular diet.  Drink enough fluid to keep your urine pale yellow.  If you vomit, rehydrate by drinking water, juice, or clear broth. General instructions  If you have sleep apnea, surgery and certain medicines can increase your risk for breathing problems. Follow instructions from your health care provider about wearing your sleep  device: ? Anytime you are sleeping, including during daytime naps. ? While taking prescription pain medicines, sleeping medicines, or medicines that make you drowsy.  Have a responsible adult stay with you for the time you are told. It is important to have someone help care for you until you are awake and alert.  Return to your normal activities as told by your health care provider. Ask your health care provider what activities are safe for you.  Take over-the-counter and prescription medicines only as told by your health care provider.  If you smoke, do not smoke without supervision.  Keep all follow-up visits as told by your health care provider. This is important. Contact a health care provider if:  You have nausea or vomiting that does not get better with medicine.  You cannot eat or drink without vomiting.  You have pain that does not get better with medicine.  You are unable to pass urine.  You develop a skin rash.  You have a fever.  You have redness around your IV site that gets worse. Get help right away if:  You have difficulty breathing.  You have chest pain.  You have blood in your urine or stool, or you vomit blood. Summary  After the procedure, it is common to have a sore throat or nausea. It is also common to feel tired.  Have a responsible adult stay with you for the time you are told. It is important to have someone help care for you until you are awake and alert.    When you feel hungry, start by eating small amounts of foods that are soft and easy to digest (bland), such as toast. Gradually return to your regular diet.  Drink enough fluid to keep your urine pale yellow.  Return to your normal activities as told by your health care provider. Ask your health care provider what activities are safe for you. This information is not intended to replace advice given to you by your health care provider. Make sure you discuss any questions you have with your  health care provider. Document Revised: 06/28/2020 Document Reviewed: 01/26/2020 Elsevier Patient Education  2021 [employer]  

## 2021-03-13 NOTE — Anesthesia Postprocedure Evaluation (Signed)
Anesthesia Post Note  Patient: Antoniette Peake Dekker  Procedure(s) Performed: FIRST STAGE: CYSTOSCOPY WITH RETROGRADE PYELOGRAM, URETEROSCOPY AND STENT PLACEMENT (Left ) HOLMIUM LASER APPLICATION (Left )     Patient location during evaluation: PACU Anesthesia Type: General Level of consciousness: awake and alert Pain management: pain level controlled Vital Signs Assessment: post-procedure vital signs reviewed and stable Respiratory status: spontaneous breathing, nonlabored ventilation, respiratory function stable and patient connected to nasal cannula oxygen Cardiovascular status: blood pressure returned to baseline and stable Postop Assessment: no apparent nausea or vomiting Anesthetic complications: no   No complications documented.  Last Vitals:  Vitals:   03/13/21 1145 03/13/21 1209  BP: 137/79 (!) 169/90  Pulse: 77 77  Resp: 11 16  Temp:  36.5 C  SpO2: 95% 100%    Last Pain:  Vitals:   03/13/21 1209  TempSrc:   PainSc: 0-No pain                 Nicollette Wilhelmi S

## 2021-03-13 NOTE — Transfer of Care (Signed)
Immediate Anesthesia Transfer of Care Note  Patient: [name] [name]  Procedure(s) Performed: FIRST STAGE: CYSTOSCOPY WITH RETROGRADE PYELOGRAM, URETEROSCOPY AND STENT PLACEMENT (Left ) HOLMIUM LASER APPLICATION (Left )  Patient Location: PACU  Anesthesia Type:General  Level of Consciousness: awake, alert  and oriented  Airway & Oxygen Therapy: Patient Spontanous Breathing and Patient connected to face mask oxygen  Post-op Assessment: Report given to RN  Post vital signs: Reviewed and stable  Last Vitals:  Vitals Value Taken Time  BP 142/76 03/13/21 1130  Temp 36.6 C 03/13/21 1129  Pulse 80 03/13/21 1132  Resp 17 03/13/21 1132  SpO2 99 % 03/13/21 1132  Vitals shown include unvalidated device data.  Last Pain:  Vitals:   03/13/21 1129  TempSrc:   PainSc: 0-No pain         Complications: No complications documented.

## 2021-03-13 NOTE — H&P (Signed)
[name] [name] is an 80 y.o. female.    Chief Complaint: Pre-OP LEFT 1st stage Ureteroscopic Stone Manipulation  HPI:   1 - Rt Chronic Hydronephrosis / Partial Ureteral Stricture / Atrophic Kidney - s/p Rt robotic nephrectomy 02/2020 for atrophic kidney with multiple prior severe infections some requiring ICU admissions and multiple perc drains. Path benign.   2 - Recurrent Nephrolithiasis - Prior composition CaPO4, CaOx  Pre 2014 - URS x several, open ureterolithotomy on Left, SWL on right x2  02/2019 - CT stone free  06/2020 - KUB, RUS - ? 52mm lower pap tip calc, Cr 0.8, no hydro, stable cysts  01/2021 - CT - LLP 2cm stone and ? stone in Rt ureteral stump (28mm) on eval recurrent infections.    3 - Recurrent Cystitis - long h.o erecurrent cystitis with stone / atrophic kidney nidues. Most recent CX e. coli 2022 x several sens kefles, nitro, bactrim, doxy.   PMH sig for AFib / Thrombocytosis / Eliquus (primary prevention only, cardioversion 2018, follows Dr Bonn[facility] in [place]) , lap chole, hyst, left open ureterolithotomy, ureteroscopy x several, shockwave lithotripsy. Her PCP is Diana Eves MD with [facility] in [place].    [name] [name] is seen to proceed with LEFT 1st stage ureteroscopy with goal to get stone free and reduce nidus of recurrent GU infections. She has been on macrobid pre-op according to most recent CX. No interval fevers. Cr 1.2,    Past Medical History:  Diagnosis Date  . Abdominal aortic atherosclerosis ([place]) 05/07/2016   Noted on CT abd/pelvis  . Acute renal failure ([place]) 06/04/2013  . Acute respiratory failure with hypoxia ([place]) 06/04/2013  . Anemia, unspecified 06/08/2013  . Anticoagulant long-term use    ELIQUIS  . Cancer [facility])    uterine cancer   . Chronic anticoagulation 08/04/2017  . Chronic diastolic (congestive) heart failure (HCC)     pt denies   . Chronic diastolic congestive heart failure ([place]) 05/31/2015  . Chronic kidney disease 2014    arf on dialysis in icu  . Complication of anesthesia    2021- kidney removed at [facility] pt reports did not get enough med to wake her up and she could not talk and then recovered after given more of wake up med from anesthesia   . Congenital vaginal enterocele 04/15/2016  . Coronary artery disease   . Cystocele, midline    followed by dr c. [facility] in [place])  pt uses pessary  . Diarrhea 06/11/2013  . Diverticulosis 05/07/2016   Noted on CT abd/pelvis  . Dyslipidemia 05/31/2015  . E coli bacteremia 06/08/2013  . Encounter for monitoring flecainide therapy 07/30/2016  . Essential thrombocythemia ([place]) 03/04/2021  . Female genital prolapse 04/15/2016  . First degree heart block   . General weakness   . GERD (gastroesophageal reflux disease)   . Hematuria    intermittently due to ureter right stent  . Hiatal hernia   . High risk medication use 04/15/2016  . History of arteriovenostomy for renal dialysis ([place])   . History of colon polyps   . History of gastric polyp   . History of kidney infection    12-31-2018  perinephrtic abscess  right side  s/p drains x2 01-10-2019,  05/ 2020 drains removed  . History of kidney stones   . History of peptic ulcer disease   . History of septic shock    2011 (pt unaware) and 06-04-2013  w/ acute  renal failure  . History of uterine cancer    STAGE I  --  S/P TAH W/ BSO  (NO OTHER TX)  . History of ventilator dependency ([place]) 2014   in setting of septic shock  . Hydronephrosis 05/07/2016  . Hyperlipemia 04/15/2016  . Hyperlipidemia   . Hypertension   . Iron deficiency anemia hematology/ oncology--- dr Bobby Rumpf at [facility] in [place]--- per lov note 08-07-2017  stabilized and felt to be more chronic anemia   01/ 2018  dx iron def. anemia  s/p  IV Iron infusion and taking oral iron supplement  . Lactic acidosis 06/04/2013  . Lumbar compression fracture ([place])    01-22-2019 per imaging L1  (06-01-2019 per pt  currently no pain)  . Midline cystocele 04/15/2016  . Nonfunctioning kidney 03/23/2020  . NSTEMI, initial episode of care ([place]) 06/07/2013   pt denies   . Occlusion of ureteral stent ([place])   . PAF (paroxysmal atrial fibrillation) ([place]) DX JUNE 2012   CARDIOLOGIST--  DR Vernie Shanks--- [facility])  CHADS2  . Palpitations 04/15/2016  . Paroxysmal atrial fibrillation ([place]) 08/04/2017  . Presence of pessary   . Pyelonephritis 06/04/2013  . Right wrist fracture    per pt fractured on 03-10-2019,  had closed reduction and wearing a brace  . Sepsis ([place]) 05/07/2016  . Septic shock(785.52) 06/04/2013  . Tendonitis, Achilles 04/15/2016  . Ureteral obstruction, right 06/04/2013  . Ureteral stricture, right urologist-- dr Tresa Moore   Chronic--- treated with ureteral stent  . Urinary tract infection without hematuria   . Uses walker    and wheelchair for longer distance  . Ventricular ectopy 12/28/2020  . Wears glasses     Past Surgical History:  Procedure Laterality Date  . APPENDECTOMY    . BACK SURGERY    . CARDIOVERSION  09/ 2018   dr Agustin Cree   successful ([facility] )  . CHOLECYSTECTOMY  2010  . COLONOSCOPY W/ POLYPECTOMY  05/2017  . CYSTO/  BALLOON DILATION RIGHT URETERAL STRICTURE/ STENT PLACEMENT  06-02-2013  . CYSTO/  BILATERAL RETROGRADE PYELOGRAM/ RIGHT URETEROSCOPY AND STENT PLACEMENT  10-04-2005  . CYSTO/ LEFT URETEROSCOPIC STONE EXTRACTION  04-03-2006  . CYSTOSCOPY W/ RETROGRADES Right 01/03/2019   Procedure: CYSTOSCOPY WITH RETROGRADE PYELOGRAM RIGHT WITH STENT EXCHANGE;  Surgeon: Irine Seal, MD;  Location: [facility];  Service: Urology;  Laterality: Right;  . CYSTOSCOPY W/ URETERAL STENT PLACEMENT Right 02/06/2014   Procedure: CYSTOSCOPY WITH RIGHT RETROGRADE PYELOGRAM RIGHT STENT REMOVAL and REPLACEMENT, Right Ureteroscopy;  Surgeon: Ailene Rud, MD;  [facility];  Service: Urology;  Laterality: Right;  . CYSTOSCOPY W/ URETERAL STENT  PLACEMENT Right 11/26/2016   Procedure: CYSTOSCOPY WITH RETROGRADE PYELOGRAM/URETERAL STENT REPLACEMENT;  Surgeon: Alexis Frock, MD;  Location: [facility];  Service: Urology;  Laterality: Right;  . CYSTOSCOPY W/ URETERAL STENT PLACEMENT Right 04/24/2017   Procedure: CYSTOSCOPY WITH RETROGRADE PYELOGRAM/URETERAL STENT REPLACEMENT;  Surgeon: Alexis Frock, MD;  Location: [facility];  Service: Urology;  Laterality: Right;  . CYSTOSCOPY W/ URETERAL STENT PLACEMENT Right 10/07/2017   Procedure: CYSTOSCOPY WITH RETROGRADE PYELOGRAM/URETERAL STENT EXCHANGE;  Surgeon: Alexis Frock, MD;  Location[facility];  Service: Urology;  Laterality: Right;  . CYSTOSCOPY W/ URETERAL STENT PLACEMENT Right 03/03/2018   Procedure: CYSTOSCOPY WITH RIGHT RETROGRADE / RIGHT URETERAL STENT EXCHANGE;  Surgeon: Alexis Frock, MD;  Location: [facility];  Service: Urology;  Laterality: Right;  . CYSTOSCOPY W/ URETERAL STENT  PLACEMENT Right 09/22/2018   Procedure: CYSTOSCOPY WITH RETROGRADE PYELOGRAM/URETERAL STENT PLACEMENT;  Surgeon: Alexis Frock, MD;  Location: [facility];  Service: Urology;  Laterality: Right;  45 MINS  . CYSTOSCOPY W/ URETERAL STENT PLACEMENT Right 06/03/2019   Procedure: CYSTOSCOPY WITH RETROGRADE PYELOGRAM/URETERAL STENT REPLACEMENT;  Surgeon: Alexis Frock, MD;  Location: [facility];  Service: Urology;  Laterality: Right;  . CYSTOSCOPY W/ URETERAL STENT PLACEMENT Right 11/23/2019   Procedure: CYSTOSCOPY WITH RETROGRADE PYELOGRAM/URETERAL STENT PLACEMENT;  Surgeon: Alexis Frock, MD;  Location: [facility];  Service: Urology;  Laterality: Right;  45 MINS  . CYSTOSCOPY WITH LITHOLAPAXY N/A 11/21/2013   Procedure: CYSTOSCOPY WITH LITHOLAPAXY;  Surgeon: Ailene Rud, MD;  Location: [facility];  Service: Urology;  Laterality: N/A;  . CYSTOSCOPY WITH RETROGRADE PYELOGRAM, URETEROSCOPY AND  STENT PLACEMENT Bilateral 03/15/2014   Procedure: CYSTOSCOPY WITH BILATERAL RETROGRADE PYELOGRAM, RIGHT DIAGNOSTIC URETEROSCOPY AND STENT EXCHANGE;  Surgeon: Alexis Frock, MD;  Location: [facility];  Service: Urology;  Laterality: Bilateral;  . CYSTOSCOPY WITH STENT PLACEMENT Right 05/07/2016   Procedure: CYSTOSCOPY WITH RETROGRADE PYELOGRAM, URETERAL STENT PLACEMENT;  Surgeon: Franchot Gallo, MD;  Location: [facility];  Service: Urology;  Laterality: Right;  . EYE SURGERY     Bilateral cataract removal  . HEMIARTHROPLASTY HIP Right 11/2016   fractured  . IR CATHETER TUBE CHANGE  02/04/2019  . IR CATHETER TUBE CHANGE  02/04/2019  . IR CATHETER TUBE CHANGE  02/11/2019  . IR RADIOLOGIST EVAL & MGMT  03/09/2019  . JOINT REPLACEMENT    . LUMBAR FUSION  JULY 2013  . NEPHROLITHOTOMY  X2 YRS AGO  . PERCUTANEOUS NEPHROSTOMY  12/2018  . ROBOT ASSISTED LAPAROSCOPIC NEPHRECTOMY Right 03/23/2020   Procedure: XI ROBOTIC ASSISTED LAPAROSCOPIC NEPHRECTOMY;  Surgeon: Alexis Frock, MD;  Location: [facility];  Service: Urology;  Laterality: Right;  . TOTAL ABDOMINAL HYSTERECTOMY W/ BILATERAL SALPINGOOPHORECTOMY  1989  . TOTAL KNEE ARTHROPLASTY Bilateral 1992  &  1996  . TRANSTHORACIC ECHOCARDIOGRAM  07-24-2017   dr Agustin Cree   ef 60-65% (improved from last echo 2014, was 45-50%)/  trace TR/  mild AV sclerosis without stenosis  . URETEROSCOPY Right 11/21/2013   Procedure: URETEROSCOPY WITH STENT REMOVAL AND REPLACEMENT;  Surgeon: Ailene Rud, MD;  Location: [facility];  Service: Urology;  Laterality: Right;    Family History  Problem Relation Age of Onset  . Heart disease Mother   . Heart failure Mother   . Cancer Mother   . Heart disease Father   . Heart failure Father    Social History:  reports that she has never smoked. She has never used smokeless tobacco. She reports that she does not drink alcohol and does not use drugs.  Allergies:  Allergies  Allergen Reactions  . Codeine  Nausea Only and Other (See Comments)    hallucinations  . Hydrocodone Nausea Only and Other (See Comments)    No medications prior to admission.    No results found for this or any previous visit (from the past 48 hour(s)). No results found.  Review of Systems  Constitutional: Negative for chills and fever.  All other systems reviewed and are negative.   There were no vitals taken for this visit. Physical Exam Vitals reviewed.  Constitutional:      Comments: Pleasant, at baseline.   Eyes:     Pupils: Pupils are equal, round, and reactive to light.  Cardiovascular:     Rate and  Rhythm: Normal rate.  Abdominal:     General: Abdomen is flat.     Comments: Stable obesity. Prior scars w/o hernias.   Genitourinary:    Comments: No CVAT at present.  Musculoskeletal:        General: Normal range of motion.     Cervical back: Normal range of motion.  Neurological:     General: No focal deficit present.     Mental Status: She is alert.      Assessment/Plan  Proceed as planned with LEFT 1st stage ureteroscopic stone manipulation. Risks, benefits, alternatives, expected peri-op course discussed previously and reiterated [name].   Alexis Frock, MD 03/13/2021, 6:37 AM

## 2021-03-13 NOTE — Anesthesia Procedure Notes (Signed)
Procedure Name: LMA Insertion Date/Time: 03/13/2021 10:28 AM Performed by: Niel Hummer, CRNA Pre-anesthesia Checklist: Patient identified, Emergency Drugs available, Suction available and Patient being monitored Patient Re-evaluated:Patient Re-evaluated prior to induction Oxygen Delivery Method: Circle system utilized Preoxygenation: Pre-oxygenation with 100% oxygen Induction Type: IV induction LMA: LMA inserted LMA Size: 4.0 Number of attempts: 1 Dental Injury: Teeth and Oropharynx as per pre-operative assessment

## 2021-03-13 NOTE — Brief Op Note (Signed)
03/13/2021  11:17 AM  PATIENT:  [name] [name]  80 y.o. female  PRE-OPERATIVE DIAGNOSIS:  LEFT RENAL STONE  POST-OPERATIVE DIAGNOSIS:  LEFT RENAL STONE  PROCEDURE:  Procedure(s) with comments: FIRST STAGE: CYSTOSCOPY WITH RETROGRADE PYELOGRAM, URETEROSCOPY AND STENT PLACEMENT (Left) - 75 MINS HOLMIUM LASER APPLICATION (Left)  SURGEON:  Surgeon(s) and Role:    * Alexis Frock, MD - Primary  PHYSICIAN ASSISTANT:   ASSISTANTS: none   ANESTHESIA:   general  EBL:  minimal   BLOOD ADMINISTERED:none  DRAINS: none   LOCAL MEDICATIONS USED:  NONE  SPECIMEN:  Source of Specimen:  soft stone debris  DISPOSITION OF SPECIMEN:  discard  COUNTS:  YES  TOURNIQUET:  * No tourniquets in log *  DICTATION: .Other Dictation: Dictation Number  [id number]  PLAN OF CARE: Discharge to home after PACU  PATIENT DISPOSITION:  PACU - hemodynamically stable.   Delay start of Pharmacological VTE agent (>24hrs) due to surgical blood loss or risk of bleeding: yes

## 2021-03-14 ENCOUNTER — Other Ambulatory Visit: Payer: Self-pay

## 2021-03-14 ENCOUNTER — Encounter (HOSPITAL_COMMUNITY): Payer: Self-pay | Admitting: Urology

## 2021-03-14 ENCOUNTER — Ambulatory Visit (INDEPENDENT_AMBULATORY_CARE_PROVIDER_SITE_OTHER): Payer: Medicare Other | Admitting: Cardiology

## 2021-03-14 VITALS — BP 100/60 | HR 102 | Ht 67.0 in | Wt 193.0 lb

## 2021-03-14 DIAGNOSIS — Z79899 Other long term (current) drug therapy: Secondary | ICD-10-CM | POA: Diagnosis not present

## 2021-03-14 DIAGNOSIS — I480 Paroxysmal atrial fibrillation: Secondary | ICD-10-CM

## 2021-03-14 DIAGNOSIS — I440 Atrioventricular block, first degree: Secondary | ICD-10-CM | POA: Diagnosis not present

## 2021-03-14 DIAGNOSIS — R002 Palpitations: Secondary | ICD-10-CM | POA: Diagnosis not present

## 2021-03-14 DIAGNOSIS — I10 Essential (primary) hypertension: Secondary | ICD-10-CM

## 2021-03-14 NOTE — Progress Notes (Signed)
Cardiology Office Note:    Date:  03/14/2021   ID:  [name] [name], DOB 20-Jun-1941, MRN [id number]  PCP:  [name], MD  Cardiologist:  Jenne Campus, MD    Referring MD: [name], MD   Chief Complaint  Patient presents with  . Follow-up  I had urological procedure done yesterday and do not feel well today  History of Present Illness:    [name] is a 80 y.o. female with past medical history significant for paroxysmal atrial fibrillation being successfully suppressed with flecainide seems to maintain sinus rhythm, she is anticoagulated, recently she was discovered to have frequent ventricular ectopy we will try to manage this with increasing dosages of beta-blocker but that can have make her feel worse therefore the dose was decreased and today she feels better from that point review.  Problem that she has is yesterday she got some laser stone kidney stone surgery she got low-grade fever today and does not feel well.  She is on antibiotic already for it.  Denies have any cardiac complaints no chest pain palpitation tightness squeezing pressure burning chest.  Past Medical History:  Diagnosis Date  . Abdominal aortic atherosclerosis ([place]) 05/07/2016   Noted on CT abd/pelvis  . Acute renal failure ([place]) 06/04/2013  . Acute respiratory failure with hypoxia ([place]) 06/04/2013  . Anemia, unspecified 06/08/2013  . Anticoagulant long-term use    ELIQUIS  . Cancer [facility])    uterine cancer   . Chronic anticoagulation 08/04/2017  . Chronic diastolic (congestive) heart failure (HCC)     pt denies   . Chronic diastolic congestive heart failure ([place]) 05/31/2015  . Chronic kidney disease 2014   arf on dialysis in icu  . Complication of anesthesia    2021- kidney removed at [facility] pt reports did not get enough med to wake her up and she could not talk and then recovered after given more of wake up med from anesthesia   . Congenital vaginal enterocele 04/15/2016  . Coronary  artery disease   . Cystocele, midline    followed by dr c. [facility] in [place])  pt uses pessary  . Diarrhea 06/11/2013  . Diverticulosis 05/07/2016   Noted on CT abd/pelvis  . Dyslipidemia 05/31/2015  . E coli bacteremia 06/08/2013  . Encounter for monitoring flecainide therapy 07/30/2016  . Essential thrombocythemia ([place]) 03/04/2021  . Female genital prolapse 04/15/2016  . First degree heart block   . General weakness   . GERD (gastroesophageal reflux disease)   . Hematuria    intermittently due to ureter right stent  . Hiatal hernia   . High risk medication use 04/15/2016  . History of arteriovenostomy for renal dialysis ([place])   . History of colon polyps   . History of gastric polyp   . History of kidney infection    12-31-2018  perinephrtic abscess  right side  s/p drains x2 01-10-2019,  05/ 2020 drains removed  . History of kidney stones   . History of peptic ulcer disease   . History of septic shock    2011 (pt unaware) and 06-04-2013  w/ acute renal failure  . History of uterine cancer    STAGE I  --  S/P TAH W/ BSO  (NO OTHER TX)  . History of ventilator dependency ([place]) 2014   in setting of septic shock  . Hydronephrosis 05/07/2016  . Hyperlipemia 04/15/2016  . Hyperlipidemia   . Hypertension   .  Iron deficiency anemia hematology/ oncology--- dr Bobby Rumpf at [facility] in [place]--- per lov note 08-07-2017  stabilized and felt to be more chronic anemia   01/ 2018  dx iron def. anemia  s/p  IV Iron infusion and taking oral iron supplement  . Lactic acidosis 06/04/2013  . Lumbar compression fracture ([place])    01-22-2019 per imaging L1  (06-01-2019 per pt currently no pain)  . Midline cystocele 04/15/2016  . Nonfunctioning kidney 03/23/2020  . NSTEMI, initial episode of care ([place]) 06/07/2013   pt denies   . Occlusion of ureteral stent ([place])   . PAF (paroxysmal atrial fibrillation) ([place]) DX JUNE 2012   CARDIOLOGIST--  DR Vernie Shanks---  [facility])  CHADS2  . Palpitations 04/15/2016  . Paroxysmal atrial fibrillation ([place]) 08/04/2017  . Presence of pessary   . Pyelonephritis 06/04/2013  . Right wrist fracture    per pt fractured on 03-10-2019,  had closed reduction and wearing a brace  . Sepsis ([place]) 05/07/2016  . Septic shock(785.52) 06/04/2013  . Tendonitis, Achilles 04/15/2016  . Ureteral obstruction, right 06/04/2013  . Ureteral stricture, right urologist-- dr Tresa Moore   Chronic--- treated with ureteral stent  . Urinary tract infection without hematuria   . Uses walker    and wheelchair for longer distance  . Ventricular ectopy 12/28/2020  . Wears glasses     Past Surgical History:  Procedure Laterality Date  . APPENDECTOMY    . BACK SURGERY    . CARDIOVERSION  09/ 2018   dr Agustin Cree   successful ([facility] )  . CHOLECYSTECTOMY  2010  . COLONOSCOPY W/ POLYPECTOMY  05/2017  . CYSTO/  BALLOON DILATION RIGHT URETERAL STRICTURE/ STENT PLACEMENT  06-02-2013  . CYSTO/  BILATERAL RETROGRADE PYELOGRAM/ RIGHT URETEROSCOPY AND STENT PLACEMENT  10-04-2005  . CYSTO/ LEFT URETEROSCOPIC STONE EXTRACTION  04-03-2006  . CYSTOSCOPY W/ RETROGRADES Right 01/03/2019   Procedure: CYSTOSCOPY WITH RETROGRADE PYELOGRAM RIGHT WITH STENT EXCHANGE;  Surgeon: Irine Seal, MD;  Location: [facility];  Service: Urology;  Laterality: Right;  . CYSTOSCOPY W/ URETERAL STENT PLACEMENT Right 02/06/2014   Procedure: CYSTOSCOPY WITH RIGHT RETROGRADE PYELOGRAM RIGHT STENT REMOVAL and REPLACEMENT, Right Ureteroscopy;  Surgeon: Ailene Rud, MD;  Location: [facility];  Service: Urology;  Laterality: Right;  . CYSTOSCOPY W/ URETERAL STENT PLACEMENT Right 11/26/2016   Procedure: CYSTOSCOPY WITH RETROGRADE PYELOGRAM/URETERAL STENT REPLACEMENT;  Surgeon: Alexis Frock, MD;  Location: [facility];  Service: Urology;  Laterality: Right;  . CYSTOSCOPY W/ URETERAL STENT PLACEMENT Right 04/24/2017   Procedure: CYSTOSCOPY WITH  RETROGRADE PYELOGRAM/URETERAL STENT REPLACEMENT;  Surgeon: Alexis Frock, MD;  Location: [facility];  Service: Urology;  Laterality: Right;  . CYSTOSCOPY W/ URETERAL STENT PLACEMENT Right 10/07/2017   Procedure: CYSTOSCOPY WITH RETROGRADE PYELOGRAM/URETERAL STENT EXCHANGE;  Surgeon: Alexis Frock, MD;  Location: [facility];  Service: Urology;  Laterality: Right;  . CYSTOSCOPY W/ URETERAL STENT PLACEMENT Right 03/03/2018   Procedure: CYSTOSCOPY WITH RIGHT RETROGRADE / RIGHT URETERAL STENT EXCHANGE;  Surgeon: Alexis Frock, MD;  Location: [facility];  Service: Urology;  Laterality: Right;  . CYSTOSCOPY W/ URETERAL STENT PLACEMENT Right 09/22/2018   Procedure: CYSTOSCOPY WITH RETROGRADE PYELOGRAM/URETERAL STENT PLACEMENT;  Surgeon: Alexis Frock, MD;  Location: [facility];  Service: Urology;  Laterality: Right;  45 MINS  . CYSTOSCOPY W/ URETERAL STENT PLACEMENT Right 06/03/2019   Procedure: CYSTOSCOPY WITH RETROGRADE PYELOGRAM/URETERAL STENT REPLACEMENT;  Surgeon: Alexis Frock, MD;  Location:  [place];  Service: Urology;  Laterality: Right;  . CYSTOSCOPY W/ URETERAL STENT PLACEMENT Right 11/23/2019   Procedure: CYSTOSCOPY WITH RETROGRADE PYELOGRAM/URETERAL STENT PLACEMENT;  Surgeon: Alexis Frock, MD;  Location: [facility];  Service: Urology;  Laterality: Right;  45 MINS  . CYSTOSCOPY WITH LITHOLAPAXY N/A 11/21/2013   Procedure: CYSTOSCOPY WITH LITHOLAPAXY;  Surgeon: Ailene Rud, MD;  Location: [facility];  Service: Urology;  Laterality: N/A;  . CYSTOSCOPY WITH RETROGRADE PYELOGRAM, URETEROSCOPY AND STENT PLACEMENT Bilateral 03/15/2014   Procedure: CYSTOSCOPY WITH BILATERAL RETROGRADE PYELOGRAM, RIGHT DIAGNOSTIC URETEROSCOPY AND STENT EXCHANGE;  Surgeon: Alexis Frock, MD;  [facility];  Service: Urology;  Laterality: Bilateral;  . CYSTOSCOPY WITH RETROGRADE PYELOGRAM, URETEROSCOPY AND STENT  PLACEMENT Left 03/13/2021   Procedure: FIRST STAGE: CYSTOSCOPY WITH RETROGRADE PYELOGRAM, URETEROSCOPY AND STENT PLACEMENT;  Surgeon: Alexis Frock, MD;  Location: [facility];  Service: Urology;  Laterality: Left;  75 MINS  . CYSTOSCOPY WITH STENT PLACEMENT Right 05/07/2016   Procedure: CYSTOSCOPY WITH RETROGRADE PYELOGRAM, URETERAL STENT PLACEMENT;  Surgeon: Franchot Gallo, MD;  Location: [facility];  Service: Urology;  Laterality: Right;  . EYE SURGERY     Bilateral cataract removal  . HEMIARTHROPLASTY HIP Right 11/2016   fractured  . HOLMIUM LASER APPLICATION Left 9/98/3382   Procedure: HOLMIUM LASER APPLICATION;  Surgeon: Alexis Frock, MD;  Location: [facility];  Service: Urology;  Laterality: Left;  . IR CATHETER TUBE CHANGE  02/04/2019  . IR CATHETER TUBE CHANGE  02/04/2019  . IR CATHETER TUBE CHANGE  02/11/2019  . IR RADIOLOGIST EVAL & MGMT  03/09/2019  . JOINT REPLACEMENT    . LUMBAR FUSION  JULY 2013  . NEPHROLITHOTOMY  X2 YRS AGO  . PERCUTANEOUS NEPHROSTOMY  12/2018  . ROBOT ASSISTED LAPAROSCOPIC NEPHRECTOMY Right 03/23/2020   Procedure: XI ROBOTIC ASSISTED LAPAROSCOPIC NEPHRECTOMY;  Surgeon: Alexis Frock, MD;  Location[facility];  Service: Urology;  Laterality: Right;  . TOTAL ABDOMINAL HYSTERECTOMY W/ BILATERAL SALPINGOOPHORECTOMY  1989  . TOTAL KNEE ARTHROPLASTY Bilateral 1992  &  1996  . TRANSTHORACIC ECHOCARDIOGRAM  07-24-2017   dr Agustin Cree   ef 60-65% (improved from last echo 2014, was 45-50%)/  trace TR/  mild AV sclerosis without stenosis  . URETEROSCOPY Right 11/21/2013   Procedure: URETEROSCOPY WITH STENT REMOVAL AND REPLACEMENT;  Surgeon: Ailene Rud, MD;  Location: [facility];  Service: Urology;  Laterality: Right;    Current Medications: Current Meds  Medication Sig  . acetaminophen (TYLENOL) 500 MG tablet Take 1,000 mg by mouth every 6 (six) hours as needed for moderate pain or headache.   [name] amitriptyline (ELAVIL) 25 MG tablet Take 25 mg by mouth  at bedtime.  [name] apixaban (ELIQUIS) 5 MG TABS tablet Take 5 mg by mouth 2 (two) times daily.  . flecainide (TAMBOCOR) 50 MG tablet Take 1.5 tablets (75 mg total) by mouth 2 (two) times daily.  . hydroxyurea (HYDREA) 500 MG capsule Take 500 mg by mouth 3 (three) times a week. May take with food to minimize GI side effects.  . nitrofurantoin, macrocrystal-monohydrate, (MACROBID) 100 MG capsule Take 1 capsule (100 mg total) by mouth 2 (two) times daily for 4 days. (x 2 days now, and begin 2 days before next Urology surgery)  . omeprazole (PRILOSEC) 40 MG capsule Take 40 mg by mouth daily.   . ondansetron (ZOFRAN-ODT) 4 MG disintegrating tablet Take 4 mg by mouth every 6 (six) hours as needed for nausea/vomiting.  [name]  traMADol (ULTRAM) 50 MG tablet Take 1-2 tablets (50-100 mg total) by mouth every 6 (six) hours as needed for moderate pain (Post-operatively).  . triamcinolone cream (KENALOG) 0.1 % Apply 1 application topically 2 (two) times daily as needed (eczema).     Allergies:   Codeine and Hydrocodone   Social History   Socioeconomic History  . Marital status: Married    Spouse name: Not on file  . Number of children: Not on file  . Years of education: Not on file  . Highest education level: Not on file  Occupational History  . Occupation: Retired from [employer]  . Smoking status: Never Smoker  . Smokeless tobacco: Never Used  Vaping Use  . Vaping Use: Never used  Substance and Sexual Activity  . Alcohol use: No  . Drug use: No  . Sexual activity: Not on file  Other Topics Concern  . Not on file  Social History Narrative   Lives in [place] with husband. Drives, walks some, but not very active at home.    Social Determinants of Health   Financial Resource Strain: Not on file  Food Insecurity: Not on file  Transportation Needs: Not on file  Physical Activity: Not on file  Stress: Not on file  Social Connections: Not on file     Family History: The patient's  family history includes Cancer in her mother; Heart disease in her father and mother; Heart failure in her father and mother. ROS:   Please see the history of present illness.    All 14 point review of systems negative except as described per history of present illness  EKGs/Labs/Other Studies Reviewed:      Recent Labs: 12/28/2020: Magnesium 1.9 03/07/2021: BUN 20; Creatinine, Ser 1.26; Potassium 3.7; Sodium 138 03/13/2021: Hemoglobin 14.4; Platelets 476  Recent Lipid Panel No results found for: CHOL, TRIG, HDL, CHOLHDL, VLDL, LDLCALC, LDLDIRECT  Physical Exam:    VS:  BP 100/60 (BP Location: Right Arm, Patient Position: Sitting, Cuff Size: Normal)   Pulse (!) 102   Ht 5\' 7"  (1.702 m)   Wt 193 lb (87.5 kg)   SpO2 94%   BMI 30.23 kg/m     Wt Readings from Last 3 Encounters:  03/14/21 193 lb (87.5 kg)  03/13/21 190 lb 0.6 oz (86.2 kg)  03/07/21 190 lb (86.2 kg)     GEN:  Well nourished, well developed in no acute distress HEENT: Normal NECK: No JVD; No carotid bruits LYMPHATICS: No lymphadenopathy CARDIAC: RRR, no murmurs, no rubs, no gallops RESPIRATORY:  Clear to auscultation without rales, wheezing or rhonchi  ABDOMEN: Soft, non-tender, non-distended MUSCULOSKELETAL:  No edema; No deformity  SKIN: Warm and dry LOWER EXTREMITIES: no swelling NEUROLOGIC:  Alert and oriented x 3 PSYCHIATRIC:  Normal affect   ASSESSMENT:    1. Medication management   2. PAF (paroxysmal atrial fibrillation) ([place])   3. Primary hypertension   4. First degree heart block   5. Palpitations    PLAN:    In order of problems listed above:  1. Paroxysmal atrial fibrillation maintaining sinus rhythm we will continue present management. 2. Hypertension.  Blood pressure actually on the lower side today again she got urology Procedure done yesterday look like she is having fever probably because of that.  She is already on antibiotic. 3. First-degree AV block, EKG showed on PR interval 220 ms  which I will continue. 4. Palpitations: Much improve with decreasing dose of beta-blocker.   Medication Adjustments/Labs  and Tests Ordered: Current medicines are reviewed at length with the patient today.  Concerns regarding medicines are outlined above.  Orders Placed This Encounter  Procedures  . EKG 12-Lead   Medication changes: No orders of the defined types were placed in this encounter.   Signed, Park Liter, MD, [facility] 03/14/2021 11:23 AM    [place]

## 2021-03-14 NOTE — Patient Instructions (Signed)
Medication Instructions:  Your physician recommends that you continue on your current medications as directed. Please refer to the Current Medication list given to you today.  *If you need a refill on your cardiac medications before your next appointment, please call your pharmacy*   Lab Work: NONE If you have labs (blood work) drawn today and your tests are completely normal, you will receive your results only by: [name] MyChart Message (if you have MyChart) OR . A paper copy in the mail If you have any lab test that is abnormal or we need to change your treatment, we will call you to review the results.   Testing/Procedures: EKG   Follow-Up: At [facility], you and your health needs are our priority.  As part of our continuing mission to provide you with exceptional heart care, we have created designated Provider Care Teams.  These Care Teams include your primary Cardiologist (physician) and Advanced Practice Providers (APPs -  Physician Assistants and Nurse Practitioners) who all work together to provide you with the care you need, when you need it.  We recommend signing up for the patient portal called "MyChart".  Sign up information is provided on this After Visit Summary.  MyChart is used to connect with patients for Virtual Visits (Telemedicine).  Patients are able to view lab/test results, encounter notes, upcoming appointments, etc.  Non-urgent messages can be sent to your provider as well.   To learn more about what you can do with MyChart, go to [email or web address].    Your next appointment:   3 month(s)  The format for your next appointment:   In Person  Provider:   Jenne Campus, MD   Other Instructions

## 2021-03-19 ENCOUNTER — Other Ambulatory Visit: Payer: Self-pay

## 2021-03-19 NOTE — Progress Notes (Signed)
Spoke with [name] [name] to update history and review pre op instructions for 03/29/2021 procedure.  Last office note with Jenne Campus, MD on 03/14/2021 in epic.  Cardiac clearance note 03/08/21 in epic telephone encounter.  Anesthesia note and note from [facility]. in epic.

## 2021-03-28 MED ORDER — GENTAMICIN SULFATE 40 MG/ML IJ SOLN
5.0000 mg/kg | INTRAVENOUS | Status: AC
  Administered 2021-03-29: 360 mg via INTRAVENOUS
  Filled 2021-03-28: qty 9

## 2021-03-29 ENCOUNTER — Ambulatory Visit (HOSPITAL_COMMUNITY): Payer: Medicare Other | Admitting: Anesthesiology

## 2021-03-29 ENCOUNTER — Ambulatory Visit (HOSPITAL_COMMUNITY): Payer: Medicare Other

## 2021-03-29 ENCOUNTER — Encounter (HOSPITAL_COMMUNITY): Payer: Self-pay | Admitting: Urology

## 2021-03-29 ENCOUNTER — Ambulatory Visit (HOSPITAL_COMMUNITY)
Admission: RE | Admit: 2021-03-29 | Discharge: 2021-03-29 | Disposition: A | Discharge status: Home / Self Care | Payer: Medicare Other | Source: Home / Self Care | Attending: Urology | Admitting: Urology

## 2021-03-29 ENCOUNTER — Encounter (HOSPITAL_COMMUNITY)
Admission: RE | Disposition: A | Discharge status: Home / Self Care | Payer: Self-pay | Source: Home / Self Care | Attending: Urology

## 2021-03-29 DIAGNOSIS — N2889 Other specified disorders of kidney and ureter: Secondary | ICD-10-CM | POA: Diagnosis not present

## 2021-03-29 DIAGNOSIS — I480 Paroxysmal atrial fibrillation: Secondary | ICD-10-CM | POA: Insufficient documentation

## 2021-03-29 DIAGNOSIS — Z7901 Long term (current) use of anticoagulants: Secondary | ICD-10-CM | POA: Insufficient documentation

## 2021-03-29 DIAGNOSIS — I2510 Atherosclerotic heart disease of native coronary artery without angina pectoris: Secondary | ICD-10-CM | POA: Insufficient documentation

## 2021-03-29 DIAGNOSIS — Z85528 Personal history of other malignant neoplasm of kidney: Secondary | ICD-10-CM | POA: Diagnosis not present

## 2021-03-29 DIAGNOSIS — I132 Hypertensive heart and chronic kidney disease with heart failure and with stage 5 chronic kidney disease, or end stage renal disease: Secondary | ICD-10-CM | POA: Insufficient documentation

## 2021-03-29 DIAGNOSIS — Z992 Dependence on renal dialysis: Secondary | ICD-10-CM | POA: Insufficient documentation

## 2021-03-29 DIAGNOSIS — D693 Immune thrombocytopenic purpura: Secondary | ICD-10-CM | POA: Diagnosis not present

## 2021-03-29 DIAGNOSIS — Z9049 Acquired absence of other specified parts of digestive tract: Secondary | ICD-10-CM | POA: Insufficient documentation

## 2021-03-29 DIAGNOSIS — E669 Obesity, unspecified: Secondary | ICD-10-CM | POA: Diagnosis not present

## 2021-03-29 DIAGNOSIS — E785 Hyperlipidemia, unspecified: Secondary | ICD-10-CM | POA: Insufficient documentation

## 2021-03-29 DIAGNOSIS — Z90710 Acquired absence of both cervix and uterus: Secondary | ICD-10-CM | POA: Insufficient documentation

## 2021-03-29 DIAGNOSIS — N179 Acute kidney failure, unspecified: Secondary | ICD-10-CM | POA: Diagnosis not present

## 2021-03-29 DIAGNOSIS — Z20822 Contact with and (suspected) exposure to covid-19: Secondary | ICD-10-CM | POA: Diagnosis not present

## 2021-03-29 DIAGNOSIS — Z8711 Personal history of peptic ulcer disease: Secondary | ICD-10-CM | POA: Insufficient documentation

## 2021-03-29 DIAGNOSIS — Z8542 Personal history of malignant neoplasm of other parts of uterus: Secondary | ICD-10-CM | POA: Insufficient documentation

## 2021-03-29 DIAGNOSIS — K219 Gastro-esophageal reflux disease without esophagitis: Secondary | ICD-10-CM | POA: Diagnosis not present

## 2021-03-29 DIAGNOSIS — N1831 Chronic kidney disease, stage 3a: Secondary | ICD-10-CM | POA: Diagnosis not present

## 2021-03-29 DIAGNOSIS — I5032 Chronic diastolic (congestive) heart failure: Secondary | ICD-10-CM | POA: Insufficient documentation

## 2021-03-29 DIAGNOSIS — A419 Sepsis, unspecified organism: Secondary | ICD-10-CM | POA: Diagnosis not present

## 2021-03-29 DIAGNOSIS — Z8249 Family history of ischemic heart disease and other diseases of the circulatory system: Secondary | ICD-10-CM | POA: Insufficient documentation

## 2021-03-29 DIAGNOSIS — Z8619 Personal history of other infectious and parasitic diseases: Secondary | ICD-10-CM | POA: Insufficient documentation

## 2021-03-29 DIAGNOSIS — I252 Old myocardial infarction: Secondary | ICD-10-CM | POA: Insufficient documentation

## 2021-03-29 DIAGNOSIS — Z87440 Personal history of urinary (tract) infections: Secondary | ICD-10-CM | POA: Insufficient documentation

## 2021-03-29 DIAGNOSIS — N136 Pyonephrosis: Secondary | ICD-10-CM | POA: Diagnosis not present

## 2021-03-29 DIAGNOSIS — D509 Iron deficiency anemia, unspecified: Secondary | ICD-10-CM | POA: Diagnosis not present

## 2021-03-29 DIAGNOSIS — Z466 Encounter for fitting and adjustment of urinary device: Secondary | ICD-10-CM | POA: Diagnosis not present

## 2021-03-29 DIAGNOSIS — Z885 Allergy status to narcotic agent status: Secondary | ICD-10-CM | POA: Insufficient documentation

## 2021-03-29 DIAGNOSIS — N200 Calculus of kidney: Secondary | ICD-10-CM | POA: Insufficient documentation

## 2021-03-29 DIAGNOSIS — N186 End stage renal disease: Secondary | ICD-10-CM | POA: Insufficient documentation

## 2021-03-29 DIAGNOSIS — Z905 Acquired absence of kidney: Secondary | ICD-10-CM | POA: Insufficient documentation

## 2021-03-29 DIAGNOSIS — I130 Hypertensive heart and chronic kidney disease with heart failure and stage 1 through stage 4 chronic kidney disease, or unspecified chronic kidney disease: Secondary | ICD-10-CM | POA: Diagnosis not present

## 2021-03-29 DIAGNOSIS — Z90722 Acquired absence of ovaries, bilateral: Secondary | ICD-10-CM | POA: Insufficient documentation

## 2021-03-29 DIAGNOSIS — Z96653 Presence of artificial knee joint, bilateral: Secondary | ICD-10-CM | POA: Insufficient documentation

## 2021-03-29 DIAGNOSIS — N390 Urinary tract infection, site not specified: Secondary | ICD-10-CM | POA: Diagnosis not present

## 2021-03-29 DIAGNOSIS — R531 Weakness: Secondary | ICD-10-CM | POA: Diagnosis not present

## 2021-03-29 HISTORY — PX: CYSTOSCOPY WITH RETROGRADE PYELOGRAM, URETEROSCOPY AND STENT PLACEMENT: SHX5789

## 2021-03-29 LAB — CBC
HCT: 41 % (ref 36.0–46.0)
Hemoglobin: 12.6 g/dL (ref 12.0–15.0)
MCH: 27.6 pg (ref 26.0–34.0)
MCHC: 30.7 g/dL (ref 30.0–36.0)
MCV: 89.7 fL (ref 80.0–100.0)
Platelets: 565 10*3/uL — ABNORMAL HIGH (ref 150–400)
RBC: 4.57 MIL/uL (ref 3.87–5.11)
RDW: 14.8 % (ref 11.5–15.5)
WBC: 11.2 10*3/uL — ABNORMAL HIGH (ref 4.0–10.5)
nRBC: 0 % (ref 0.0–0.2)

## 2021-03-29 LAB — BASIC METABOLIC PANEL
Anion gap: 11 (ref 5–15)
BUN: 22 mg/dL (ref 8–23)
CO2: 20 mmol/L — ABNORMAL LOW (ref 22–32)
Calcium: 9 mg/dL (ref 8.9–10.3)
Chloride: 107 mmol/L (ref 98–111)
Creatinine, Ser: 1.92 mg/dL — ABNORMAL HIGH (ref 0.44–1.00)
GFR, Estimated: 26 mL/min — ABNORMAL LOW (ref >60)
Glucose, Bld: 107 mg/dL — ABNORMAL HIGH (ref 70–99)
Potassium: 5.8 mmol/L — ABNORMAL HIGH (ref 3.5–5.1)
Sodium: 138 mmol/L (ref 135–145)

## 2021-03-29 SURGERY — CYSTOURETEROSCOPY, WITH RETROGRADE PYELOGRAM AND STENT INSERTION
Anesthesia: General | Laterality: Left

## 2021-03-29 MED ORDER — LACTATED RINGERS IV SOLN: INTRAVENOUS | Status: DC

## 2021-03-29 MED ORDER — NITROFURANTOIN MONOHYD MACRO 100 MG PO CAPS: 100.0000 mg | ORAL_CAPSULE | Freq: Two times a day (BID) | ORAL | 0 refills | Status: DC

## 2021-03-29 MED ORDER — FENTANYL CITRATE (PF) 100 MCG/2ML IJ SOLN
INTRAMUSCULAR | Status: DC | PRN
  Administered 2021-03-29: 50 ug via INTRAVENOUS
  Administered 2021-03-29 (×2): 25 ug via INTRAVENOUS

## 2021-03-29 MED ORDER — FENTANYL CITRATE (PF) 100 MCG/2ML IJ SOLN
INTRAMUSCULAR | Status: AC
  Filled 2021-03-29: qty 2

## 2021-03-29 MED ORDER — OXYCODONE HCL 5 MG/5ML PO SOLN
5.0000 mg | Freq: Once | ORAL | Status: DC | PRN
Start: 2021-03-29 — End: 2021-03-29

## 2021-03-29 MED ORDER — ACETAMINOPHEN 500 MG PO TABS
1000.0000 mg | ORAL_TABLET | Freq: Once | ORAL | Status: AC
  Administered 2021-03-29: 1000 mg via ORAL
  Filled 2021-03-29: qty 2

## 2021-03-29 MED ORDER — OXYCODONE HCL 5 MG PO TABS: 5.0000 mg | ORAL_TABLET | Freq: Once | ORAL | Status: DC | PRN

## 2021-03-29 MED ORDER — PHENYLEPHRINE HCL (PRESSORS) 10 MG/ML IV SOLN
INTRAVENOUS | Status: DC | PRN
  Administered 2021-03-29: 120 ug via INTRAVENOUS

## 2021-03-29 MED ORDER — PROPOFOL 10 MG/ML IV BOLUS
INTRAVENOUS | Status: AC
  Filled 2021-03-29: qty 20

## 2021-03-29 MED ORDER — ONDANSETRON HCL 4 MG/2ML IJ SOLN: 4.0000 mg | Freq: Once | INTRAMUSCULAR | Status: DC | PRN

## 2021-03-29 MED ORDER — ONDANSETRON HCL 4 MG/2ML IJ SOLN
INTRAMUSCULAR | Status: DC | PRN
  Administered 2021-03-29: 4 mg via INTRAVENOUS

## 2021-03-29 MED ORDER — CHLORHEXIDINE GLUCONATE 0.12 % MT SOLN
15.0000 mL | Freq: Once | OROMUCOSAL | Status: AC
  Administered 2021-03-29: 15 mL via OROMUCOSAL

## 2021-03-29 MED ORDER — PHENYLEPHRINE HCL (PRESSORS) 10 MG/ML IV SOLN
INTRAVENOUS | Status: AC
  Filled 2021-03-29: qty 1

## 2021-03-29 MED ORDER — LIDOCAINE 2% (20 MG/ML) 5 ML SYRINGE
INTRAMUSCULAR | Status: AC
  Filled 2021-03-29: qty 5

## 2021-03-29 MED ORDER — TRAMADOL HCL 50 MG PO TABS: 50.0000 mg | ORAL_TABLET | Freq: Once | ORAL | Status: DC

## 2021-03-29 MED ORDER — IOHEXOL 300 MG/ML  SOLN
INTRAMUSCULAR | Status: DC | PRN
  Administered 2021-03-29 (×2): 10 mL

## 2021-03-29 MED ORDER — LIDOCAINE HCL (CARDIAC) PF 100 MG/5ML IV SOSY
PREFILLED_SYRINGE | INTRAVENOUS | Status: DC | PRN
  Administered 2021-03-29: 60 mg via INTRAVENOUS

## 2021-03-29 MED ORDER — ONDANSETRON HCL 4 MG/2ML IJ SOLN
INTRAMUSCULAR | Status: AC
  Filled 2021-03-29: qty 2

## 2021-03-29 MED ORDER — SODIUM CHLORIDE 0.9 % IR SOLN
Status: DC | PRN
  Administered 2021-03-29: 6000 mL via INTRAVESICAL

## 2021-03-29 MED ORDER — AMISULPRIDE (ANTIEMETIC) 5 MG/2ML IV SOLN
INTRAVENOUS | Status: AC
  Filled 2021-03-29: qty 4

## 2021-03-29 MED ORDER — ORAL CARE MOUTH RINSE: 15.0000 mL | Freq: Once | OROMUCOSAL | Status: AC

## 2021-03-29 MED ORDER — FENTANYL CITRATE (PF) 100 MCG/2ML IJ SOLN: 25.0000 ug | INTRAMUSCULAR | Status: DC | PRN

## 2021-03-29 MED ORDER — AMISULPRIDE (ANTIEMETIC) 5 MG/2ML IV SOLN
10.0000 mg | Freq: Once | INTRAVENOUS | Status: DC | PRN
  Administered 2021-03-29: 10 mg via INTRAVENOUS

## 2021-03-29 MED ORDER — PROPOFOL 10 MG/ML IV BOLUS
INTRAVENOUS | Status: DC | PRN
  Administered 2021-03-29: 100 mg via INTRAVENOUS

## 2021-03-29 SURGICAL SUPPLY — 24 items
BAG URO CATCHER STRL LF (MISCELLANEOUS) ×2 IMPLANT
BASKET LASER NITINOL 1.9FR (BASKET) IMPLANT
BSKT STON RTRVL 120 1.9FR (BASKET)
CATH INTERMIT  6FR 70CM (CATHETERS) ×2 IMPLANT
CLOTH BEACON ORANGE TIMEOUT ST (SAFETY) ×2 IMPLANT
EXTRACTOR STONE 1.7FRX115CM (UROLOGICAL SUPPLIES) IMPLANT
GLOVE SURG ENC TEXT LTX SZ7.5 (GLOVE) ×2 IMPLANT
GOWN STRL REUS W/TWL LRG LVL3 (GOWN DISPOSABLE) ×2 IMPLANT
GUIDEWIRE ANG ZIPWIRE 038X150 (WIRE) ×2 IMPLANT
GUIDEWIRE STR DUAL SENSOR (WIRE) ×2 IMPLANT
KIT TURNOVER KIT A (KITS) ×2 IMPLANT
LASER FIB FLEXIVA PULSE ID 365 (Laser) IMPLANT
LASER FIB FLEXIVA PULSE ID 550 (Laser) IMPLANT
LASER FIB FLEXIVA PULSE ID 910 (Laser) IMPLANT
MANIFOLD NEPTUNE II (INSTRUMENTS) ×2 IMPLANT
PACK CYSTO (CUSTOM PROCEDURE TRAY) ×2 IMPLANT
SHEATH URETERAL 12FRX28CM (UROLOGICAL SUPPLIES) ×2 IMPLANT
SHEATH URETERAL 12FRX35CM (MISCELLANEOUS) IMPLANT
STENT POLARIS 5FRX24 (STENTS) ×2 IMPLANT
TRACTIP FLEXIVA PULS ID 200XHI (Laser) IMPLANT
TRACTIP FLEXIVA PULSE ID 200 (Laser)
TUBE FEEDING 8FR 16IN STR KANG (MISCELLANEOUS) ×2 IMPLANT
TUBING CONNECTING 10 (TUBING) ×2 IMPLANT
TUBING UROLOGY SET (TUBING) ×2 IMPLANT

## 2021-03-29 NOTE — Anesthesia Procedure Notes (Signed)
Procedure Name: LMA Insertion Date/Time: 03/29/2021 11:27 AM Performed by: Justice Rocher, CRNA Pre-anesthesia Checklist: Patient identified, Emergency Drugs available, Suction available, Patient being monitored and Timeout performed Patient Re-evaluated:Patient Re-evaluated prior to induction Oxygen Delivery Method: Circle system utilized Preoxygenation: Pre-oxygenation with 100% oxygen Induction Type: IV induction Ventilation: Mask ventilation without difficulty LMA: LMA inserted LMA Size: 4.0 Number of attempts: 1 Airway Equipment and Method: Bite block Placement Confirmation: positive ETCO2,  breath sounds checked- equal and bilateral and CO2 detector Tube secured with: Tape Dental Injury: Teeth and Oropharynx as per pre-operative assessment

## 2021-03-29 NOTE — Brief Op Note (Signed)
03/29/2021  12:26 PM  PATIENT:  [name] [name]  80 y.o. female  PRE-OPERATIVE DIAGNOSIS:  LEFT RENAL STONE, RULE OUT RIGHT URETERAL STUMP STONE  POST-OPERATIVE DIAGNOSIS:  LEFT RENAL STONE  PROCEDURE:  Cystoscopy, LEFT retrograde pyelogram, LEFT ureteroscopy with basketining / Irrigation of stones, LEFT ureteral stent exchange, RIGHT diagnostic ureteroscopy  SURGEON:  Surgeon(s) and Role:    Alexis Frock, MD - Primary  PHYSICIAN ASSISTANT:   ASSISTANTS: none   ANESTHESIA:   general  EBL:  0 mL   BLOOD ADMINISTERED:none  DRAINS: none   LOCAL MEDICATIONS USED:  NONE  SPECIMEN:  Source of Specimen:  left renal pelvis urine  DISPOSITION OF SPECIMEN:  microbiology for gram stain and culture  COUNTS:  YES  TOURNIQUET:  * No tourniquets in log *  DICTATION: .Other Dictation: Dictation Number [id number]  PLAN OF CARE: Discharge to home after PACU  PATIENT DISPOSITION:  PACU - hemodynamically stable.   Delay start of Pharmacological VTE agent (>24hrs) due to surgical blood loss or risk of bleeding: not applicable

## 2021-03-29 NOTE — Discharge Instructions (Signed)
1 - You may have urinary urgency (bladder spasms) and bloody urine on / off with stent in place. This is normal.  2 - Remove tethered stent on Monday morning at home by pulling string, then blue-white plastic tubing, and discarding. Office is open Monday if any problems arise.   3 - Call MD or go to ER for fever >102, severe pain / nausea / vomiting not relieved by medications, or acute change in medical status

## 2021-03-29 NOTE — Transfer of Care (Signed)
Immediate Anesthesia Transfer of Care Note  Patient: [name] [name]  Procedure(s) Performed: Procedure(s) (LRB): SECOND STGAE:CYSTOSCOPY WITH RETROGRADE PYELOGRAM, URETEROSCOPY AND STENT EXCHANGE (Left) HOLMIUM LASER APPLICATION (Left)  Patient Location: PACU  Anesthesia Type: General  Level of Consciousness: awake, sedated, patient cooperative and responds to stimulation  Airway & Oxygen Therapy: Patient Spontanous Breathing and Patient connected to face mask oxygen and soft FM   Post-op Assessment: Report given to PACU RN, Post -op Vital signs reviewed and stable and Patient moving all extremities  Post vital signs: Reviewed and stable  Complications: No apparent anesthesia complications

## 2021-03-29 NOTE — H&P (Signed)
[name] [name] is an 80 y.o. female.    Chief Complaint: Pre-OP LEFT 2nd Stage Ureteroscopic Stone Manipulation  HPI:    1 - Rt Chronic Hydronephrosis / Partial Ureteral Stricture / Atrophic Kidney - s/p Rt robotic nephrectomy 02/2020 for atrophic kidney with multiple prior severe infections some requiring ICU admissions and multiple perc drains. Path benign.   2 - Recurrent Nephrolithiasis - Prior composition CaPO4, CaOx  Pre 2014 - URS x several, open ureterolithotomy on Left, SWL on right x2  02/2019 - CT stone free  06/2020 - KUB, RUS - ? 23mm lower pap tip calc, Cr 0.8, no hydro, stable cysts  01/2021 - CT - LLP 2cm stone and ? stone in Rt ureteral stump (14mm) on eval recurrent infections. Underwent left 1st stage procedure on 03/23/21.    3 - Recurrent Cystitis - long h.o erecurrent cystitis with stone / atrophic kidney nidues. Most recent CX e. coli 2022 x several sens kefles, nitro, bactrim, doxy.   PMH sig for AFib / Thrombocytosis / Eliquus (primary prevention only, cardioversion 2018, follows Dr Bonnita [facility] in [place]) , lap chole, hyst, left open ureterolithotomy, ureteroscopy x several, shockwave lithotripsy. Her PCP is [name] [name] with [facility] in [place].    [name] is seen to proceed with LEFT  2nd stage ureteroscopy with goal to get stone free and reduce nidus of recurrent GU infections. She has been on macrobid pre-op according to most recent CX.   Past Medical History:  Diagnosis Date  . Abdominal aortic atherosclerosis ([place]) 05/07/2016   Noted on CT abd/pelvis  . Acute renal failure ([place]) 06/04/2013  . Acute respiratory failure with hypoxia ([place]) 06/04/2013  . Anemia, unspecified 06/08/2013  . Anticoagulant long-term use    ELIQUIS  . Cancer [facility])    uterine cancer   . Chronic anticoagulation 08/04/2017  . Chronic diastolic (congestive) heart failure (HCC)     pt denies   . Chronic diastolic congestive heart failure ([place]) 05/31/2015  . Chronic  kidney disease 2014   arf on dialysis in icu  . Complication of anesthesia    2021- kidney removed at [facility] pt reports did not get enough med to wake her up and she could not talk and then recovered after given more of wake up med from anesthesia   . Congenital vaginal enterocele 04/15/2016  . Coronary artery disease   . Cystocele, midline    followed by dr c. Marvel Plan [facility] in [place])  pt uses pessary  . Diarrhea 06/11/2013  . Diverticulosis 05/07/2016   Noted on CT abd/pelvis  . Dyslipidemia 05/31/2015  . E coli bacteremia 06/08/2013  . Encounter for monitoring flecainide therapy 07/30/2016  . Essential thrombocythemia ([place]) 03/04/2021  . Female genital prolapse 04/15/2016  . First degree heart block   . General weakness   . GERD (gastroesophageal reflux disease)   . Hematuria    intermittently due to ureter right stent  . Hiatal hernia   . High risk medication use 04/15/2016  . History of arteriovenostomy for renal dialysis ([place])   . History of colon polyps   . History of gastric polyp   . History of kidney infection    12-31-2018  perinephrtic abscess  right side  s/p drains x2 01-10-2019,  05/ 2020 drains removed  . History of kidney stones   . History of peptic ulcer disease   . History of septic shock    2011 (pt unaware) and 06-04-2013  w/ acute renal failure  . History of uterine cancer    STAGE I  --  S/P TAH W/ BSO  (NO OTHER TX)  . History of ventilator dependency ([place]) 2014   in setting of septic shock  . Hydronephrosis 05/07/2016  . Hyperlipemia 04/15/2016  . Hyperlipidemia   . Hypertension   . Iron deficiency anemia hematology/ oncology--- dr Bobby Rumpf at [facility] in [place]--- per lov note 08-07-2017  stabilized and felt to be more chronic anemia   01/ 2018  dx iron def. anemia  s/p  IV Iron infusion and taking oral iron supplement  . Lactic acidosis 06/04/2013  . Lumbar compression fracture ([place])    01-22-2019 per imaging L1   (06-01-2019 per pt currently no pain)  . Midline cystocele 04/15/2016  . Nonfunctioning kidney 03/23/2020  . NSTEMI, initial episode of care ([place]) 06/07/2013   pt denies   . Occlusion of ureteral stent ([place])   . PAF (paroxysmal atrial fibrillation) ([place]) DX JUNE 2012   CARDIOLOGIST--  DR Vernie Shanks--- [facility])  CHADS2  . Palpitations 04/15/2016  . Paroxysmal atrial fibrillation ([place]) 08/04/2017  . Presence of pessary   . Pyelonephritis 06/04/2013  . Right wrist fracture    per pt fractured on 03-10-2019,  had closed reduction and wearing a brace  . Sepsis ([place]) 05/07/2016  . Septic shock(785.52) 06/04/2013  . Tendonitis, Achilles 04/15/2016  . Ureteral obstruction, right 06/04/2013  . Ureteral stricture, right urologist-- dr Tresa Moore   Chronic--- treated with ureteral stent  . Urinary tract infection without hematuria   . Uses walker    and wheelchair for longer distance  . Ventricular ectopy 12/28/2020  . Wears glasses     Past Surgical History:  Procedure Laterality Date  . APPENDECTOMY    . BACK SURGERY    . CARDIOVERSION  09/ 2018   dr Agustin Cree   successful ([facility] )  . CHOLECYSTECTOMY  2010  . COLONOSCOPY W/ POLYPECTOMY  05/2017  . CYSTO/  BALLOON DILATION RIGHT URETERAL STRICTURE/ STENT PLACEMENT  06-02-2013  . CYSTO/  BILATERAL RETROGRADE PYELOGRAM/ RIGHT URETEROSCOPY AND STENT PLACEMENT  10-04-2005  . CYSTO/ LEFT URETEROSCOPIC STONE EXTRACTION  04-03-2006  . CYSTOSCOPY W/ RETROGRADES Right 01/03/2019   Procedure: CYSTOSCOPY WITH RETROGRADE PYELOGRAM RIGHT WITH STENT EXCHANGE;  Surgeon: Irine Seal, [name];  Location: [facility];  Service: Urology;  Laterality: Right;  . CYSTOSCOPY W/ URETERAL STENT PLACEMENT Right 02/06/2014   Procedure: CYSTOSCOPY WITH RIGHT RETROGRADE PYELOGRAM RIGHT STENT REMOVAL and REPLACEMENT, Right Ureteroscopy;  Surgeon: Ailene Rud, [name];  Location: [facility];  Service: Urology;  Laterality: Right;  . CYSTOSCOPY W/  URETERAL STENT PLACEMENT Right 11/26/2016   Procedure: CYSTOSCOPY WITH RETROGRADE PYELOGRAM/URETERAL STENT REPLACEMENT;  Surgeon: Alexis Frock, [name];  Location: [facility];  Service: Urology;  Laterality: Right;  . CYSTOSCOPY W/ URETERAL STENT PLACEMENT Right 04/24/2017   Procedure: CYSTOSCOPY WITH RETROGRADE PYELOGRAM/URETERAL STENT REPLACEMENT;  Surgeon: Alexis Frock, [name];  Location[facility];  Service: Urology;  Laterality: Right;  . CYSTOSCOPY W/ URETERAL STENT PLACEMENT Right 10/07/2017   Procedure: CYSTOSCOPY WITH RETROGRADE PYELOGRAM/URETERAL STENT EXCHANGE;  Surgeon: Alexis Frock, [name];  Location: [facility];  Service: Urology;  Laterality: Right;  . CYSTOSCOPY W/ URETERAL STENT PLACEMENT Right 03/03/2018   Procedure: CYSTOSCOPY WITH RIGHT RETROGRADE / RIGHT URETERAL STENT EXCHANGE;  Surgeon: Alexis Frock, [name];  Location: [facility];  Service: Urology;  Laterality: Right;  . CYSTOSCOPY W/  URETERAL STENT PLACEMENT Right 09/22/2018   Procedure: CYSTOSCOPY WITH RETROGRADE PYELOGRAM/URETERAL STENT PLACEMENT;  Surgeon: Alexis Frock, [name];  Location: [facility];  Service: Urology;  Laterality: Right;  45 MINS  . CYSTOSCOPY W/ URETERAL STENT PLACEMENT Right 06/03/2019   Procedure: CYSTOSCOPY WITH RETROGRADE PYELOGRAM/URETERAL STENT REPLACEMENT;  Surgeon: Alexis Frock, [name];  Location: [facility];  Service: Urology;  Laterality: Right;  . CYSTOSCOPY W/ URETERAL STENT PLACEMENT Right 11/23/2019   Procedure: CYSTOSCOPY WITH RETROGRADE PYELOGRAM/URETERAL STENT PLACEMENT;  Surgeon: Alexis Frock, [name];  Location: [facility];  Service: Urology;  Laterality: Right;  45 MINS  . CYSTOSCOPY WITH LITHOLAPAXY N/A 11/21/2013   Procedure: CYSTOSCOPY WITH LITHOLAPAXY;  Surgeon: Ailene Rud, [name];  Location: [facility];  Service: Urology;  Laterality: N/A;  . CYSTOSCOPY WITH RETROGRADE PYELOGRAM,  URETEROSCOPY AND STENT PLACEMENT Bilateral 03/15/2014   Procedure: CYSTOSCOPY WITH BILATERAL RETROGRADE PYELOGRAM, RIGHT DIAGNOSTIC URETEROSCOPY AND STENT EXCHANGE;  Surgeon: Alexis Frock, [name];  Location[facility];  Service: Urology;  Laterality: Bilateral;  . CYSTOSCOPY WITH RETROGRADE PYELOGRAM, URETEROSCOPY AND STENT PLACEMENT Left 03/13/2021   Procedure: FIRST STAGE: CYSTOSCOPY WITH RETROGRADE PYELOGRAM, URETEROSCOPY AND STENT PLACEMENT;  Surgeon: Alexis Frock, [name];  Location: [facility];  Service: Urology;  Laterality: Left;  75 MINS  . CYSTOSCOPY WITH STENT PLACEMENT Right 05/07/2016   Procedure: CYSTOSCOPY WITH RETROGRADE PYELOGRAM, URETERAL STENT PLACEMENT;  Surgeon: Franchot Gallo, [name];  Location: [facility];  Service: Urology;  Laterality: Right;  . EYE SURGERY     Bilateral cataract removal  . HEMIARTHROPLASTY HIP Right 11/2016   fractured  . HOLMIUM LASER APPLICATION Left 9/62/2297   Procedure: HOLMIUM LASER APPLICATION;  Surgeon: Alexis Frock, [name];  Location: [facility];  Service: Urology;  Laterality: Left;  . IR CATHETER TUBE CHANGE  02/04/2019  . IR CATHETER TUBE CHANGE  02/04/2019  . IR CATHETER TUBE CHANGE  02/11/2019  . IR RADIOLOGIST EVAL & MGMT  03/09/2019  . JOINT REPLACEMENT    . LUMBAR FUSION  JULY 2013  . NEPHROLITHOTOMY  X2 YRS AGO  . PERCUTANEOUS NEPHROSTOMY  12/2018  . ROBOT ASSISTED LAPAROSCOPIC NEPHRECTOMY Right 03/23/2020   Procedure: XI ROBOTIC ASSISTED LAPAROSCOPIC NEPHRECTOMY;  Surgeon: Alexis Frock, [name];  Location: [facility];  Service: Urology;  Laterality: Right;  . TOTAL ABDOMINAL HYSTERECTOMY W/ BILATERAL SALPINGOOPHORECTOMY  1989  . TOTAL KNEE ARTHROPLASTY Bilateral 1992  &  1996  . TRANSTHORACIC ECHOCARDIOGRAM  07-24-2017   dr Agustin Cree   ef 60-65% (improved from last echo 2014, was 45-50%)/  trace TR/  mild AV sclerosis without stenosis  . URETEROSCOPY Right 11/21/2013   Procedure: URETEROSCOPY WITH STENT REMOVAL AND REPLACEMENT;  Surgeon: Ailene Rud, [name];   Location: [facility];  Service: Urology;  Laterality: Right;    Family History  Problem Relation Age of Onset  . Heart disease Mother   . Heart failure Mother   . Cancer Mother   . Heart disease Father   . Heart failure Father    Social History:  reports that she has never smoked. She has never used smokeless tobacco. She reports that she does not drink alcohol and does not use drugs.  Allergies:  Allergies  Allergen Reactions  . Codeine Nausea Only and Other (See Comments)    hallucinations  . Hydrocodone Nausea Only and Other (See Comments)    No medications prior to admission.    No results found for this or any previous visit (from the past  48 hour(s)). No results found.  Review of Systems  There were no vitals taken for this visit. Physical Exam Vitals reviewed.  HENT:     Head: Normocephalic.     Nose: Nose normal.  Eyes:     Pupils: Pupils are equal, round, and reactive to light.  Cardiovascular:     Rate and Rhythm: Normal rate.     Pulses: Normal pulses.  Pulmonary:     Effort: Pulmonary effort is normal.  Abdominal:     Comments: Stable truncal obesity. Prior scars w/o hernias.   Genitourinary:    Comments: No CVAT at present.  Musculoskeletal:     Cervical back: Normal range of motion.  Skin:    General: Skin is warm.  Neurological:     Mental Status: She is alert.  Psychiatric:        Mood and Affect: Mood normal.      Assessment/Plan  Proceed as planned with LEFT 2nd stage ureteroscopic stone manipulation. Will interrogate Rt ureteral stump as well to max r/o stone nidus. Risks, benefits, alternatives, expected peri-op course discussed previously and reiterated today.   Alexis Frock, [name] 03/29/2021, 8:21 AM

## 2021-03-29 NOTE — Anesthesia Preprocedure Evaluation (Addendum)
Anesthesia Evaluation  Patient identified by MRN, date of birth, ID band Patient awake    Reviewed: Allergy & Precautions, NPO status , Patient's Chart, lab work & pertinent test results  History of Anesthesia Complications (+) DIFFICULT AIRWAY and history of anesthetic complications (residual NMB with nephrectomy in 2021- was not reintubated, hx diff airway in past- grade 2 view with DL in 2021)  Airway Mallampati: III  TM Distance: >3 FB Neck ROM: Full   Comment: Small mandible Dental no notable dental hx. (+) Teeth Intact, Dental Advisory Given   Pulmonary neg pulmonary ROS,    Pulmonary exam normal breath sounds clear to auscultation       Cardiovascular hypertension, Pt. on medications + CAD, + Past MI and +CHF (grade 1 diastolic dysfunction)  Normal cardiovascular exam+ dysrhythmias (eliquis, flecainide) Atrial Fibrillation  Rhythm:Regular Rate:Normal  Echo 01/2020: 1. Left ventricular ejection fraction, by estimation, is 60 to 65%. The  left ventricle has normal function. The left ventricle has no regional  wall motion abnormalities. Left ventricular diastolic parameters are  consistent with Grade I diastolic  dysfunction (impaired relaxation).  2. Right ventricular systolic function is normal. The right ventricular  size is normal.  3. The mitral valve is normal in structure. No evidence of mitral valve  regurgitation. No evidence of mitral stenosis.  4. The aortic valve is normal in structure. Aortic valve regurgitation is  not visualized. No aortic stenosis is present.  5. There is mild dilatation of the ascending aorta measuring 38 mm.  6. The inferior vena cava is normal in size with greater than 50%  respiratory variability, suggesting right atrial pressure of 3 mmHg.    Neuro/Psych negative neurological ROS  negative psych ROS   GI/Hepatic Neg liver ROS, hiatal hernia, GERD  Medicated and Controlled,   Endo/Other  negative endocrine ROS  Renal/GU Renal disease (L renal stone)Hx urosepsis requiring HD 2011, 2014  negative genitourinary   Musculoskeletal negative musculoskeletal ROS (+)   Abdominal   Peds  Hematology negative hematology ROS (+)   Anesthesia Other Findings   Reproductive/Obstetrics negative OB ROS                           Anesthesia Physical Anesthesia Plan  ASA: III  Anesthesia Plan: General   Post-op Pain Management:    Induction: Intravenous  PONV Risk Score and Plan: 4 or greater and Ondansetron, Dexamethasone and Treatment may vary due to age or medical condition  Airway Management Planned: LMA  Additional Equipment: None  Intra-op Plan:   Post-operative Plan: Extubation in OR  Informed Consent: I have reviewed the patients History and Physical, chart, labs and discussed the procedure including the risks, benefits and alternatives for the proposed anesthesia with the patient or authorized representative who has indicated his/her understanding and acceptance.     Dental advisory given  Plan Discussed with: CRNA  Anesthesia Plan Comments:        Anesthesia Quick Evaluation

## 2021-03-30 ENCOUNTER — Emergency Department (HOSPITAL_COMMUNITY): Payer: Medicare Other

## 2021-03-30 ENCOUNTER — Inpatient Hospital Stay (HOSPITAL_COMMUNITY)
Admission: EM | Admit: 2021-03-30 | Discharge: 2021-04-02 | DRG: 660 | Disposition: A | Discharge status: Home / Self Care | Payer: Medicare Other | Attending: Internal Medicine | Admitting: Internal Medicine

## 2021-03-30 ENCOUNTER — Other Ambulatory Visit: Payer: Self-pay

## 2021-03-30 DIAGNOSIS — N1831 Chronic kidney disease, stage 3a: Secondary | ICD-10-CM | POA: Diagnosis present

## 2021-03-30 DIAGNOSIS — Z8711 Personal history of peptic ulcer disease: Secondary | ICD-10-CM

## 2021-03-30 DIAGNOSIS — K219 Gastro-esophageal reflux disease without esophagitis: Secondary | ICD-10-CM | POA: Diagnosis present

## 2021-03-30 DIAGNOSIS — Z981 Arthrodesis status: Secondary | ICD-10-CM

## 2021-03-30 DIAGNOSIS — D509 Iron deficiency anemia, unspecified: Secondary | ICD-10-CM | POA: Diagnosis present

## 2021-03-30 DIAGNOSIS — N179 Acute kidney failure, unspecified: Secondary | ICD-10-CM | POA: Diagnosis present

## 2021-03-30 DIAGNOSIS — Z9049 Acquired absence of other specified parts of digestive tract: Secondary | ICD-10-CM

## 2021-03-30 DIAGNOSIS — Z905 Acquired absence of kidney: Secondary | ICD-10-CM

## 2021-03-30 DIAGNOSIS — Z6829 Body mass index (BMI) 29.0-29.9, adult: Secondary | ICD-10-CM

## 2021-03-30 DIAGNOSIS — N200 Calculus of kidney: Secondary | ICD-10-CM | POA: Diagnosis present

## 2021-03-30 DIAGNOSIS — N390 Urinary tract infection, site not specified: Principal | ICD-10-CM | POA: Diagnosis present

## 2021-03-30 DIAGNOSIS — Z8249 Family history of ischemic heart disease and other diseases of the circulatory system: Secondary | ICD-10-CM

## 2021-03-30 DIAGNOSIS — A419 Sepsis, unspecified organism: Secondary | ICD-10-CM | POA: Diagnosis not present

## 2021-03-30 DIAGNOSIS — N12 Tubulo-interstitial nephritis, not specified as acute or chronic: Secondary | ICD-10-CM | POA: Diagnosis present

## 2021-03-30 DIAGNOSIS — E785 Hyperlipidemia, unspecified: Secondary | ICD-10-CM | POA: Diagnosis present

## 2021-03-30 DIAGNOSIS — I10 Essential (primary) hypertension: Secondary | ICD-10-CM | POA: Diagnosis present

## 2021-03-30 DIAGNOSIS — Z86010 Personal history of colonic polyps: Secondary | ICD-10-CM

## 2021-03-30 DIAGNOSIS — Z87442 Personal history of urinary calculi: Secondary | ICD-10-CM

## 2021-03-30 DIAGNOSIS — Z20822 Contact with and (suspected) exposure to covid-19: Secondary | ICD-10-CM | POA: Diagnosis present

## 2021-03-30 DIAGNOSIS — I2510 Atherosclerotic heart disease of native coronary artery without angina pectoris: Secondary | ICD-10-CM | POA: Diagnosis present

## 2021-03-30 DIAGNOSIS — Z8542 Personal history of malignant neoplasm of other parts of uterus: Secondary | ICD-10-CM

## 2021-03-30 DIAGNOSIS — I5032 Chronic diastolic (congestive) heart failure: Secondary | ICD-10-CM | POA: Diagnosis present

## 2021-03-30 DIAGNOSIS — Z885 Allergy status to narcotic agent status: Secondary | ICD-10-CM

## 2021-03-30 DIAGNOSIS — I480 Paroxysmal atrial fibrillation: Secondary | ICD-10-CM | POA: Diagnosis present

## 2021-03-30 DIAGNOSIS — Z809 Family history of malignant neoplasm, unspecified: Secondary | ICD-10-CM

## 2021-03-30 DIAGNOSIS — Z96653 Presence of artificial knee joint, bilateral: Secondary | ICD-10-CM | POA: Diagnosis present

## 2021-03-30 DIAGNOSIS — N1832 Chronic kidney disease, stage 3b: Secondary | ICD-10-CM | POA: Diagnosis present

## 2021-03-30 DIAGNOSIS — Z7901 Long term (current) use of anticoagulants: Secondary | ICD-10-CM

## 2021-03-30 DIAGNOSIS — D473 Essential (hemorrhagic) thrombocythemia: Secondary | ICD-10-CM | POA: Diagnosis present

## 2021-03-30 DIAGNOSIS — I252 Old myocardial infarction: Secondary | ICD-10-CM

## 2021-03-30 DIAGNOSIS — D693 Immune thrombocytopenic purpura: Secondary | ICD-10-CM | POA: Diagnosis present

## 2021-03-30 DIAGNOSIS — Z90710 Acquired absence of both cervix and uterus: Secondary | ICD-10-CM

## 2021-03-30 DIAGNOSIS — I130 Hypertensive heart and chronic kidney disease with heart failure and stage 1 through stage 4 chronic kidney disease, or unspecified chronic kidney disease: Secondary | ICD-10-CM | POA: Diagnosis present

## 2021-03-30 DIAGNOSIS — N136 Pyonephrosis: Principal | ICD-10-CM | POA: Diagnosis present

## 2021-03-30 DIAGNOSIS — Z87440 Personal history of urinary (tract) infections: Secondary | ICD-10-CM

## 2021-03-30 DIAGNOSIS — Z90722 Acquired absence of ovaries, bilateral: Secondary | ICD-10-CM

## 2021-03-30 DIAGNOSIS — E669 Obesity, unspecified: Secondary | ICD-10-CM | POA: Diagnosis present

## 2021-03-30 LAB — URINE CULTURE: Culture: >=100000 — AB

## 2021-03-30 NOTE — ED Triage Notes (Signed)
Pt came in today with c/o weakness and fever at home. Pt states that she got a cystoscopy yesterday. She has been running a fever since last night. She has been taking her temp temporally and has had readings as high as 104. Pt has been taking an excessive amount of Tylenol so fever is normal. Took 650mg  of Tylenol at 2000 and 650mg  at 2040.

## 2021-03-30 NOTE — ED Provider Notes (Signed)
[place] DEPT Provider Note   CSN: [id number] Arrival date & time: 03/30/21  2215     History Chief Complaint  Patient presents with  . Weakness  . Fever    [name] [name] is a 80 y.o. female.  Patient presents to the emergency department for evaluation of fever.  Patient had cystoscopy and ureteroscopy with stent exchange yesterday morning.  She left the hospital around noon and by 3:30 PM she began to have rigors and was running a fever.  She ran a fever through the evening and then did well through the night after taking Tylenol.  This afternoon she started running a fever again.  Temperature was as high as 104 at home.  No associated nausea, vomiting, diarrhea, abdominal pain, back pain.  Patient denies cough and URI symptoms.        Past Medical History:  Diagnosis Date  . Abdominal aortic atherosclerosis ([place]) 05/07/2016   Noted on CT abd/pelvis  . Acute renal failure ([place]) 06/04/2013  . Acute respiratory failure with hypoxia ([place]) 06/04/2013  . Anemia, unspecified 06/08/2013  . Anticoagulant long-term use    ELIQUIS  . Cancer [facility])    uterine cancer   . Chronic anticoagulation 08/04/2017  . Chronic diastolic (congestive) heart failure (HCC)     pt denies   . Chronic diastolic congestive heart failure ([place]) 05/31/2015  . Chronic kidney disease 2014   arf on dialysis in icu  . Complication of anesthesia    2021- kidney removed at [facility] pt reports did not get enough med to wake her up and she could not talk and then recovered after given more of wake up med from anesthesia   . Congenital vaginal enterocele 04/15/2016  . Coronary artery disease   . Cystocele, midline    followed by dr c. Marvel Plan [facility] in [place])  pt uses pessary  . Diarrhea 06/11/2013  . Diverticulosis 05/07/2016   Noted on CT abd/pelvis  . Dyslipidemia 05/31/2015  . E coli bacteremia 06/08/2013  . Encounter for monitoring flecainide therapy  07/30/2016  . Essential thrombocythemia ([place]) 03/04/2021  . Female genital prolapse 04/15/2016  . First degree heart block   . General weakness   . GERD (gastroesophageal reflux disease)   . Hematuria    intermittently due to ureter right stent  . Hiatal hernia   . High risk medication use 04/15/2016  . History of arteriovenostomy for renal dialysis ([place])   . History of colon polyps   . History of gastric polyp   . History of kidney infection    12-31-2018  perinephrtic abscess  right side  s/p drains x2 01-10-2019,  05/ 2020 drains removed  . History of kidney stones   . History of peptic ulcer disease   . History of septic shock    2011 (pt unaware) and 06-04-2013  w/ acute renal failure  . History of uterine cancer    STAGE I  --  S/P TAH W/ BSO  (NO OTHER TX)  . History of ventilator dependency ([place]) 2014   in setting of septic shock  . Hydronephrosis 05/07/2016  . Hyperlipemia 04/15/2016  . Hyperlipidemia   . Hypertension   . Iron deficiency anemia hematology/ oncology--- dr Bobby Rumpf at [facility] in [place]--- per lov note 08-07-2017  stabilized and felt to be more chronic anemia   01/ 2018  dx iron def. anemia  s/p  IV Iron infusion and taking oral iron supplement  .  Lactic acidosis 06/04/2013  . Lumbar compression fracture ([place])    01-22-2019 per imaging L1  (06-01-2019 per pt currently no pain)  . Midline cystocele 04/15/2016  . Nonfunctioning kidney 03/23/2020  . NSTEMI, initial episode of care ([place]) 06/07/2013   pt denies   . Occlusion of ureteral stent ([place])   . PAF (paroxysmal atrial fibrillation) ([place]) DX JUNE 2012   CARDIOLOGIST--  DR Vernie Shanks---  [facility])  CHADS2  . Palpitations 04/15/2016  . Paroxysmal atrial fibrillation ([place]) 08/04/2017  . Presence of pessary   . Pyelonephritis 06/04/2013  . Right wrist fracture    per pt fractured on 03-10-2019,  had closed reduction and wearing a brace  . Sepsis ([place]) 05/07/2016  . Septic  shock(785.52) 06/04/2013  . Tendonitis, Achilles 04/15/2016  . Ureteral obstruction, right 06/04/2013  . Ureteral stricture, right urologist-- dr Tresa Moore   Chronic--- treated with ureteral stent  . Urinary tract infection without hematuria   . Uses walker    and wheelchair for longer distance  . Ventricular ectopy 12/28/2020  . Wears glasses     Patient Active Problem List   Diagnosis Date Noted  . Cancer ([place])   . Complication of anesthesia   . Hypertension   . Essential thrombocythemia ([place]) 03/04/2021  . Ventricular ectopy 12/28/2020  . Presence of pessary   . Wears glasses   . Uses walker   . Right wrist fracture   . PAF (paroxysmal atrial fibrillation) ([place])   . Lumbar compression fracture ([place])   . Iron deficiency anemia   . Hyperlipidemia   . History of uterine cancer   . History of septic shock   . History of peptic ulcer disease   . History of kidney infection   . History of kidney stones   . History of gastric polyp   . History of colon polyps   . History of arteriovenostomy for renal dialysis ([place])   . Hiatal hernia   . Hematuria   . GERD (gastroesophageal reflux disease)   . General weakness   . First degree heart block   . Cystocele, midline   . Chronic diastolic (congestive) heart failure ([place])   . Anticoagulant long-term use   . Nonfunctioning kidney 03/23/2020  . Urinary tract infection without hematuria   . Occlusion of ureteral stent ([place])   . Chronic anticoagulation 08/04/2017  . Coronary artery disease 06/16/2017  . Encounter for monitoring flecainide therapy 07/30/2016  . Ureteral stricture, right 05/07/2016  . Sepsis ([place]) 05/07/2016  . Hydronephrosis 05/07/2016  . Diverticulosis 05/07/2016  . Abdominal aortic atherosclerosis ([place]) 05/07/2016  . Congenital vaginal enterocele 04/15/2016  . Female genital prolapse 04/15/2016  . High risk medication use 04/15/2016  . Hyperlipemia 04/15/2016  . Midline cystocele 04/15/2016  . Palpitations 04/15/2016   . Tendonitis, Achilles 04/15/2016  . Paroxysmal atrial fibrillation ([place]) 05/31/2015  . Chronic diastolic congestive heart failure ([place]) 05/31/2015  . Dyslipidemia 05/31/2015  . Diarrhea 06/11/2013  . E coli bacteremia 06/08/2013  . Anemia, unspecified 06/08/2013  . NSTEMI, initial episode of care ([place]) 06/07/2013  . Septic shock(785.52) 06/04/2013  . Acute respiratory failure with hypoxia ([place]) 06/04/2013  . Pyelonephritis 06/04/2013  . Ureteral obstruction, right 06/04/2013  . Acute renal failure ([place]) 06/04/2013  . Lactic acidosis 06/04/2013  . History of ventilator dependency ([place]) 2014  . Chronic kidney disease 2014    Past Surgical History:  Procedure Laterality Date  . APPENDECTOMY    . BACK SURGERY    .  CARDIOVERSION  09/ 2018   dr Agustin Cree   successful ([facility] )  . CHOLECYSTECTOMY  2010  . COLONOSCOPY W/ POLYPECTOMY  05/2017  . CYSTO/  BALLOON DILATION RIGHT URETERAL STRICTURE/ STENT PLACEMENT  06-02-2013  . CYSTO/  BILATERAL RETROGRADE PYELOGRAM/ RIGHT URETEROSCOPY AND STENT PLACEMENT  10-04-2005  . CYSTO/ LEFT URETEROSCOPIC STONE EXTRACTION  04-03-2006  . CYSTOSCOPY W/ RETROGRADES Right 01/03/2019   Procedure: CYSTOSCOPY WITH RETROGRADE PYELOGRAM RIGHT WITH STENT EXCHANGE;  Surgeon: Irine Seal, MD;  Location: [facility];  Service: Urology;  Laterality: Right;  . CYSTOSCOPY W/ URETERAL STENT PLACEMENT Right 02/06/2014   Procedure: CYSTOSCOPY WITH RIGHT RETROGRADE PYELOGRAM RIGHT STENT REMOVAL and REPLACEMENT, Right Ureteroscopy;  Surgeon: Ailene Rud, MD;  Location: [facility];  Service: Urology;  Laterality: Right;  . CYSTOSCOPY W/ URETERAL STENT PLACEMENT Right 11/26/2016   Procedure: CYSTOSCOPY WITH RETROGRADE PYELOGRAM/URETERAL STENT REPLACEMENT;  Surgeon: Alexis Frock, MD;  Location: [facility];  Service: Urology;  Laterality: Right;  . CYSTOSCOPY W/ URETERAL STENT PLACEMENT Right 04/24/2017   Procedure: CYSTOSCOPY WITH RETROGRADE  PYELOGRAM/URETERAL STENT REPLACEMENT;  Surgeon: Alexis Frock, MD;  [facility];  Service: Urology;  Laterality: Right;  . CYSTOSCOPY W/ URETERAL STENT PLACEMENT Right 10/07/2017   Procedure: CYSTOSCOPY WITH RETROGRADE PYELOGRAM/URETERAL STENT EXCHANGE;  Surgeon: Alexis Frock, MD;  Location[facility];  Service: Urology;  Laterality: Right;  . CYSTOSCOPY W/ URETERAL STENT PLACEMENT Right 03/03/2018   Procedure: CYSTOSCOPY WITH RIGHT RETROGRADE / RIGHT URETERAL STENT EXCHANGE;  Surgeon: Alexis Frock, MD;  [facility];  Service: Urology;  Laterality: Right;  . CYSTOSCOPY W/ URETERAL STENT PLACEMENT Right 09/22/2018   Procedure: CYSTOSCOPY WITH RETROGRADE PYELOGRAM/URETERAL STENT PLACEMENT;  Surgeon: Alexis Frock, MD;  Location: [facility];  Service: Urology;  Laterality: Right;  45 MINS  . CYSTOSCOPY W/ URETERAL STENT PLACEMENT Right 06/03/2019   Procedure: CYSTOSCOPY WITH RETROGRADE PYELOGRAM/URETERAL STENT REPLACEMENT;  Surgeon: Alexis Frock, MD;  Location: [facility];  Service: Urology;  Laterality: Right;  . CYSTOSCOPY W/ URETERAL STENT PLACEMENT Right 11/23/2019   Procedure: CYSTOSCOPY WITH RETROGRADE PYELOGRAM/URETERAL STENT PLACEMENT;  Surgeon: Alexis Frock, MD;  Location: [facility];  Service: Urology;  Laterality: Right;  45 MINS  . CYSTOSCOPY WITH LITHOLAPAXY N/A 11/21/2013   Procedure: CYSTOSCOPY WITH LITHOLAPAXY;  Surgeon: Ailene Rud, MD;  Location: [facility];  Service: Urology;  Laterality: N/A;  . CYSTOSCOPY WITH RETROGRADE PYELOGRAM, URETEROSCOPY AND STENT PLACEMENT Bilateral 03/15/2014   Procedure: CYSTOSCOPY WITH BILATERAL RETROGRADE PYELOGRAM, RIGHT DIAGNOSTIC URETEROSCOPY AND STENT EXCHANGE;  Surgeon: Alexis Frock, MD;  [facility];  Service: Urology;  Laterality: Bilateral;  . CYSTOSCOPY WITH RETROGRADE PYELOGRAM, URETEROSCOPY AND STENT PLACEMENT  Left 03/13/2021   Procedure: FIRST STAGE: CYSTOSCOPY WITH RETROGRADE PYELOGRAM, URETEROSCOPY AND STENT PLACEMENT;  Surgeon: Alexis Frock, MD;  [facility];  Service: Urology;  Laterality: Left;  75 MINS  . CYSTOSCOPY WITH STENT PLACEMENT Right 05/07/2016   Procedure: CYSTOSCOPY WITH RETROGRADE PYELOGRAM, URETERAL STENT PLACEMENT;  Surgeon: Franchot Gallo, MD;  [facility];  Service: Urology;  Laterality: Right;  . EYE SURGERY     Bilateral cataract removal  . HEMIARTHROPLASTY HIP Right 11/2016   fractured  . HOLMIUM LASER APPLICATION Left 8/75/6433   Procedure: HOLMIUM LASER APPLICATION;  Surgeon: Alexis Frock, MD;  [facility];  Service: Urology;  Laterality: Left;  . IR CATHETER TUBE CHANGE  02/04/2019  . IR CATHETER TUBE CHANGE  02/04/2019  . IR CATHETER TUBE CHANGE  02/11/2019  . IR RADIOLOGIST EVAL & MGMT  03/09/2019  . JOINT REPLACEMENT    . LUMBAR FUSION  JULY 2013  . NEPHROLITHOTOMY  X2 YRS AGO  . PERCUTANEOUS NEPHROSTOMY  12/2018  . ROBOT ASSISTED LAPAROSCOPIC NEPHRECTOMY Right 03/23/2020   Procedure: XI ROBOTIC ASSISTED LAPAROSCOPIC NEPHRECTOMY;  Surgeon: Alexis Frock, MD;  Location[facility];  Service: Urology;  Laterality: Right;  . TOTAL ABDOMINAL HYSTERECTOMY W/ BILATERAL SALPINGOOPHORECTOMY  1989  . TOTAL KNEE ARTHROPLASTY Bilateral 1992  &  1996  . TRANSTHORACIC ECHOCARDIOGRAM  07-24-2017   dr Agustin Cree   ef 60-65% (improved from last echo 2014, was 45-50%)/  trace TR/  mild AV sclerosis without stenosis  . URETEROSCOPY Right 11/21/2013   Procedure: URETEROSCOPY WITH STENT REMOVAL AND REPLACEMENT;  Surgeon: Ailene Rud, MD;  Location: [facility];  Service: Urology;  Laterality: Right;     OB History   No obstetric history on file.     Family History  Problem Relation Age of Onset  . Heart disease Mother   . Heart failure Mother   . Cancer Mother   . Heart disease Father   . Heart failure Father     Social History    Tobacco Use  . Smoking status: Never Smoker  . Smokeless tobacco: Never Used  Vaping Use  . Vaping Use: Never used  Substance Use Topics  . Alcohol use: No  . Drug use: No    Home Medications Prior to Admission medications   Medication Sig Start Date End Date Taking? Authorizing Provider  acetaminophen (TYLENOL) 500 MG tablet Take 1,000 mg by mouth every 6 (six) hours as needed for moderate pain or headache.     [provider]  amitriptyline (ELAVIL) 25 MG tablet Take 25 mg by mouth at bedtime.    [provider]  apixaban (ELIQUIS) 5 MG TABS tablet Take 5 mg by mouth 2 (two) times daily.    [provider]  flecainide (TAMBOCOR) 50 MG tablet Take 1.5 tablets (75 mg total) by mouth 2 (two) times daily. 08/28/20   Park Liter, MD  hydroxyurea (HYDREA) 500 MG capsule Take 500 mg by mouth 3 (three) times a week. May take with food to minimize GI side effects.    [provider]  nitrofurantoin, macrocrystal-monohydrate, (MACROBID) 100 MG capsule Take 1 capsule (100 mg total) by mouth 2 (two) times daily for 3 days. To prevent infection with tethered stent in place. 03/29/21 04/01/21  Alexis Frock, MD  omeprazole (PRILOSEC) 40 MG capsule Take 40 mg by mouth daily.     [provider]  ondansetron (ZOFRAN-ODT) 4 MG disintegrating tablet Take 4 mg by mouth every 6 (six) hours as needed for nausea/vomiting. 11/16/18   [provider]  traMADol (ULTRAM) 50 MG tablet Take 1-2 tablets (50-100 mg total) by mouth every 6 (six) hours as needed for moderate pain (Post-operatively). 03/13/21 03/13/22  Alexis Frock, MD  triamcinolone cream (KENALOG) 0.1 % Apply 1 application topically 2 (two) times daily as needed (eczema).    [provider]    Allergies    Codeine and Hydrocodone  Review of Systems   Review of Systems  Constitutional: Positive for chills and fever.  All other systems reviewed and are negative.   Physical  Exam Updated Vital Signs BP 102/87   Pulse 73   Temp 98.4 F (36.9 C) (Oral)   Resp 15   Ht  5\' 7"  (1.702 m)   Wt 86.2 kg   SpO2 99%   BMI 29.76 kg/m   Physical Exam Vitals and nursing note reviewed.  Constitutional:      General: She is not in acute distress.    Appearance: Normal appearance. She is well-developed.  HENT:     Head: Normocephalic and atraumatic.     Right Ear: Hearing normal.     Left Ear: Hearing normal.     Nose: Nose normal.  Eyes:     Conjunctiva/sclera: Conjunctivae normal.     Pupils: Pupils are equal, round, and reactive to light.  Cardiovascular:     Rate and Rhythm: Regular rhythm.     Heart sounds: S1 normal and S2 normal. No murmur heard. No friction rub. No gallop.   Pulmonary:     Effort: Pulmonary effort is normal. No respiratory distress.     Breath sounds: Normal breath sounds.  Chest:     Chest wall: No tenderness.  Abdominal:     General: Bowel sounds are normal.     Palpations: Abdomen is soft.     Tenderness: There is no abdominal tenderness. There is no guarding or rebound. Negative signs include Murphy's sign and McBurney's sign.     Hernia: No hernia is present.  Musculoskeletal:        General: Normal range of motion.     Cervical back: Normal range of motion and neck supple.  Skin:    General: Skin is warm and dry.     Findings: No rash.  Neurological:     Mental Status: She is alert and oriented to person, place, and time.     GCS: GCS eye subscore is 4. GCS verbal subscore is 5. GCS motor subscore is 6.     Cranial Nerves: No cranial nerve deficit.     Sensory: No sensory deficit.     Coordination: Coordination normal.  Psychiatric:        Speech: Speech normal.        Behavior: Behavior normal.        Thought Content: Thought content normal.     ED Results / Procedures / Treatments   Labs (all labs ordered are listed, but only abnormal results are displayed) Labs Reviewed  COMPREHENSIVE METABOLIC PANEL -  Abnormal; Notable for the following components:      Result Value   CO2 21 (*)    Glucose, Bld 110 (*)    BUN 25 (*)    Creatinine, Ser 2.20 (*)    Calcium 8.4 (*)    Albumin 2.8 (*)    GFR, Estimated 22 (*)    All other components within normal limits  CBC WITH DIFFERENTIAL/PLATELET - Abnormal; Notable for the following components:   WBC 15.0 (*)    Hemoglobin 11.1 (*)    Platelets 429 (*)    Neutro Abs 12.2 (*)    Monocytes Absolute 1.3 (*)    Abs Immature Granulocytes 0.09 (*)    All other components within normal limits  PROTIME-INR - Abnormal; Notable for the following components:   Prothrombin Time 22.6 (*)    INR 2.0 (*)    All other components within normal limits  APTT - Abnormal; Notable for the following components:   aPTT 49 (*)    All other components within normal limits  URINALYSIS, ROUTINE W REFLEX MICROSCOPIC - Abnormal; Notable for the following components:   Color, Urine BROWN (*)    APPearance TURBID (*)  Hgb urine dipstick MODERATE (*)    Bilirubin Urine SMALL (*)    Protein, ur >300 (*)    Nitrite POSITIVE (*)    Leukocytes,Ua SMALL (*)    All other components within normal limits  URINALYSIS, MICROSCOPIC (REFLEX) - Abnormal; Notable for the following components:   Bacteria, UA FEW (*)    All other components within normal limits  RESP PANEL BY RT-PCR (FLU A&B, COVID) ARPGX2  CULTURE, BLOOD (SINGLE)  URINE CULTURE  LACTIC ACID, PLASMA    EKG None  Radiology DG Chest Port 1 View  Result Date: 03/30/2021 CLINICAL DATA:  Sepsis EXAM: PORTABLE CHEST 1 VIEW COMPARISON:  None. FINDINGS: The heart size and mediastinal contours are within normal limits. Both lungs are clear. The visualized skeletal structures are unremarkable. IMPRESSION: No active disease. Electronically Signed   By: Ulyses Jarred M.D.   On: 03/30/2021 23:58   DG C-Arm 1-60 Min-No Report  Result Date: 03/29/2021 Fluoroscopy was utilized by the requesting physician.  No radiographic  interpretation.   CT RENAL STONE STUDY  Result Date: 03/31/2021 CLINICAL DATA:  80 year old female with weakness and fever. Status post cystoscopy yesterday, urinary tract stone. History of right nephrectomy, uterine cancer. EXAM: CT ABDOMEN AND PELVIS WITHOUT CONTRAST TECHNIQUE: Multidetector CT imaging of the abdomen and pelvis was performed following the standard protocol without IV contrast. COMPARISON:  CT Abdomen and Pelvis 01/08/2021 and earlier. FINDINGS: Lower chest: Tortuous descending thoracic aorta appears stable since March. Borderline to mild cardiomegaly. No pericardial or pleural effusion. Mildly fibrotic bilateral lung base scarring greater on the left. Hepatobiliary: Chronically absent gallbladder. Negative noncontrast liver. Pancreas: Negative. Spleen: Negative. Adrenals/Urinary Tract: Normal adrenal glands. Surgically absent right kidney. Blind-ending right ureter at the pelvic inlet is mildly dilated but unchanged. Left double-J ureteral stent in place with distal pigtail at the ureterovesical junction and not completely inside the bladder on series 2, image 72. Trace gas within the urinary bladder. Left periureteral stranding. And left hydronephrosis, moderate (coronal image 90) with small volume of gas in the left renal collecting system. Associated moderate left perinephric inflammatory stranding. Staghorn calculus seen in March does not persist. There are several punctate and/or indistinct left renal collecting system calcifications now noted. Chronic upper pole simple fluid density cyst again noted. Stomach/Bowel: Increased stool ball in the rectum compared to March. Upstream sigmoid colon is nondilated with diverticulosis, but no active inflammation. Redundant transverse colon and hepatic flexure. No large bowel inflammation. Diminutive or absent appendix. Negative terminal ileum. No dilated small bowel. Decompressed stomach and duodenum. No free air or free fluid. Vascular/Lymphatic:  Aortoiliac calcified atherosclerosis. Normal caliber abdominal aorta. Vascular patency is not evaluated in the absence of IV contrast. Reactive appearing left perinephric retroperitoneal lymph nodes, subcentimeter. No other lymphadenopathy. Reproductive: Stable pessary. Surgically absent uterus. Diminutive or absent ovaries. Other: No pelvic free fluid. Musculoskeletal: Osteopenia. Chronic lower lumbar posterior and interbody fusion. Chronic L1 and L2 compression fractures appear stable. Chronic right hip arthroplasty. No acute or suspicious osseous abnormality identified. IMPRESSION: 1. Left double-J ureteral stent in place although distal pigtail is centered at the left UVJ and does not seem to completely inside the bladder. Associated moderate left hydronephrosis with perinephric and periureteral inflammatory stranding. Small residual left nephrolithiasis. Small volume of gas in the left renal collecting system and urinary bladder. 2. Stable sequelae of right nephrectomy. 3. Increased stool ball in the rectum compared to March, consider mild fecal impaction. 4. Sigmoid diverticulosis without active inflammation. Aortic Atherosclerosis (ICD10-I70.0). Lung  base scarring. Osteopenia with chronic L1 and L2 compression fractures. Electronically Signed   By: Genevie Ann M.D.   On: 03/31/2021 04:24    Procedures Procedures   Medications Ordered in ED Medications  cefTRIAXone (ROCEPHIN) 2 g in sodium chloride 0.9 % 100 mL IVPB (2 g Intravenous New Bag/Given 03/31/21 0503)  sodium chloride 0.9 % bolus 1,000 mL (0 mLs Intravenous Stopped 03/31/21 0405)    ED Course  I have reviewed the triage vital signs and the nursing notes.  Pertinent labs & imaging results that were available during my care of the patient were reviewed by me and considered in my medical decision making (see chart for details).    MDM Rules/Calculators/A&P                          Patient presents to the emergency department for evaluation  of fever.  Patient had cystoscopy and ureteroscopy with stent exchange yesterday.  She started running a fever several hours after the procedure.  Patient's temperature was 104 at home but she has been taking Tylenol and she is afebrile here.  Patient with elevated creatinine at 2.2.  Negative lactic acid.  Urinalysis does suggest infection.  Culture pending.  Administered Rocephin 2 g IV.  CT scan shows perinephric and periureteral stranding.  There is hydronephrosis.  There is also concern that at least part of the distal end of the J stent is within the ureter.  With hydronephrosis and increasing creatinine, will need to be sure that kidney is decompressed (patient has had previous right nephrectomy).  She is making urine and is not having any pain currently, which is reassuring.  Discussed with Dr. Louis Meckel, on-call for urology.  Will see patient this morning.  Will admit to hospitalist for further management of urinary tract infection.  Final Clinical Impression(s) / ED Diagnoses Final diagnoses:  Lower urinary tract infectious disease    Rx / DC Orders ED Discharge Orders    None       Mckala Pantaleon, Gwenyth Allegra, MD 03/31/21 [id number]

## 2021-03-31 ENCOUNTER — Encounter (HOSPITAL_COMMUNITY): Payer: Self-pay | Admitting: Family Medicine

## 2021-03-31 ENCOUNTER — Emergency Department (HOSPITAL_COMMUNITY): Payer: Medicare Other

## 2021-03-31 DIAGNOSIS — N390 Urinary tract infection, site not specified: Secondary | ICD-10-CM | POA: Diagnosis present

## 2021-03-31 DIAGNOSIS — Z20822 Contact with and (suspected) exposure to covid-19: Secondary | ICD-10-CM | POA: Diagnosis not present

## 2021-03-31 DIAGNOSIS — K219 Gastro-esophageal reflux disease without esophagitis: Secondary | ICD-10-CM | POA: Diagnosis not present

## 2021-03-31 DIAGNOSIS — N179 Acute kidney failure, unspecified: Secondary | ICD-10-CM | POA: Diagnosis not present

## 2021-03-31 DIAGNOSIS — D693 Immune thrombocytopenic purpura: Secondary | ICD-10-CM | POA: Diagnosis not present

## 2021-03-31 DIAGNOSIS — Z9049 Acquired absence of other specified parts of digestive tract: Secondary | ICD-10-CM | POA: Diagnosis not present

## 2021-03-31 DIAGNOSIS — E785 Hyperlipidemia, unspecified: Secondary | ICD-10-CM | POA: Diagnosis not present

## 2021-03-31 DIAGNOSIS — R531 Weakness: Secondary | ICD-10-CM | POA: Diagnosis not present

## 2021-03-31 DIAGNOSIS — Z981 Arthrodesis status: Secondary | ICD-10-CM | POA: Diagnosis not present

## 2021-03-31 DIAGNOSIS — Z8711 Personal history of peptic ulcer disease: Secondary | ICD-10-CM | POA: Diagnosis not present

## 2021-03-31 DIAGNOSIS — E669 Obesity, unspecified: Secondary | ICD-10-CM | POA: Diagnosis not present

## 2021-03-31 DIAGNOSIS — D509 Iron deficiency anemia, unspecified: Secondary | ICD-10-CM | POA: Diagnosis not present

## 2021-03-31 DIAGNOSIS — Z8542 Personal history of malignant neoplasm of other parts of uterus: Secondary | ICD-10-CM | POA: Diagnosis not present

## 2021-03-31 DIAGNOSIS — Z90710 Acquired absence of both cervix and uterus: Secondary | ICD-10-CM | POA: Diagnosis not present

## 2021-03-31 DIAGNOSIS — I2510 Atherosclerotic heart disease of native coronary artery without angina pectoris: Secondary | ICD-10-CM | POA: Diagnosis not present

## 2021-03-31 DIAGNOSIS — Z86010 Personal history of colonic polyps: Secondary | ICD-10-CM | POA: Diagnosis not present

## 2021-03-31 DIAGNOSIS — Z885 Allergy status to narcotic agent status: Secondary | ICD-10-CM | POA: Diagnosis not present

## 2021-03-31 DIAGNOSIS — N136 Pyonephrosis: Secondary | ICD-10-CM | POA: Diagnosis not present

## 2021-03-31 DIAGNOSIS — Z85528 Personal history of other malignant neoplasm of kidney: Secondary | ICD-10-CM | POA: Diagnosis not present

## 2021-03-31 DIAGNOSIS — N200 Calculus of kidney: Secondary | ICD-10-CM | POA: Diagnosis not present

## 2021-03-31 DIAGNOSIS — I252 Old myocardial infarction: Secondary | ICD-10-CM | POA: Diagnosis not present

## 2021-03-31 DIAGNOSIS — N12 Tubulo-interstitial nephritis, not specified as acute or chronic: Secondary | ICD-10-CM | POA: Diagnosis not present

## 2021-03-31 DIAGNOSIS — I5032 Chronic diastolic (congestive) heart failure: Secondary | ICD-10-CM | POA: Diagnosis not present

## 2021-03-31 DIAGNOSIS — Z96653 Presence of artificial knee joint, bilateral: Secondary | ICD-10-CM | POA: Diagnosis present

## 2021-03-31 DIAGNOSIS — I480 Paroxysmal atrial fibrillation: Secondary | ICD-10-CM | POA: Diagnosis present

## 2021-03-31 DIAGNOSIS — Z90722 Acquired absence of ovaries, bilateral: Secondary | ICD-10-CM | POA: Diagnosis not present

## 2021-03-31 DIAGNOSIS — I130 Hypertensive heart and chronic kidney disease with heart failure and stage 1 through stage 4 chronic kidney disease, or unspecified chronic kidney disease: Secondary | ICD-10-CM | POA: Diagnosis not present

## 2021-03-31 DIAGNOSIS — Z7901 Long term (current) use of anticoagulants: Secondary | ICD-10-CM | POA: Diagnosis not present

## 2021-03-31 DIAGNOSIS — N1831 Chronic kidney disease, stage 3a: Secondary | ICD-10-CM | POA: Diagnosis not present

## 2021-03-31 HISTORY — DX: Urinary tract infection, site not specified: N39.0

## 2021-03-31 LAB — COMPREHENSIVE METABOLIC PANEL
ALT: 11 U/L (ref 0–44)
AST: 16 U/L (ref 15–41)
Albumin: 2.8 g/dL — ABNORMAL LOW (ref 3.5–5.0)
Alkaline Phosphatase: 57 U/L (ref 38–126)
Anion gap: 9 (ref 5–15)
BUN: 25 mg/dL — ABNORMAL HIGH (ref 8–23)
CO2: 21 mmol/L — ABNORMAL LOW (ref 22–32)
Calcium: 8.4 mg/dL — ABNORMAL LOW (ref 8.9–10.3)
Chloride: 106 mmol/L (ref 98–111)
Creatinine, Ser: 2.2 mg/dL — ABNORMAL HIGH (ref 0.44–1.00)
GFR, Estimated: 22 mL/min — ABNORMAL LOW (ref >60)
Glucose, Bld: 110 mg/dL — ABNORMAL HIGH (ref 70–99)
Potassium: 4.1 mmol/L (ref 3.5–5.1)
Sodium: 136 mmol/L (ref 135–145)
Total Bilirubin: 0.5 mg/dL (ref 0.3–1.2)
Total Protein: 7 g/dL (ref 6.5–8.1)

## 2021-03-31 LAB — CBC WITH DIFFERENTIAL/PLATELET
Abs Immature Granulocytes: 0.09 10*3/uL — ABNORMAL HIGH (ref 0.00–0.07)
Basophils Absolute: 0.1 10*3/uL (ref 0.0–0.1)
Basophils Relative: 1 %
Eosinophils Absolute: 0.1 10*3/uL (ref 0.0–0.5)
Eosinophils Relative: 1 %
HCT: 36.3 % (ref 36.0–46.0)
Hemoglobin: 11.1 g/dL — ABNORMAL LOW (ref 12.0–15.0)
Immature Granulocytes: 1 %
Lymphocytes Relative: 8 %
Lymphs Abs: 1.2 10*3/uL (ref 0.7–4.0)
MCH: 27.5 pg (ref 26.0–34.0)
MCHC: 30.6 g/dL (ref 30.0–36.0)
MCV: 90.1 fL (ref 80.0–100.0)
Monocytes Absolute: 1.3 10*3/uL — ABNORMAL HIGH (ref 0.1–1.0)
Monocytes Relative: 9 %
Neutro Abs: 12.2 10*3/uL — ABNORMAL HIGH (ref 1.7–7.7)
Neutrophils Relative %: 80 %
Platelets: 429 10*3/uL — ABNORMAL HIGH (ref 150–400)
RBC: 4.03 MIL/uL (ref 3.87–5.11)
RDW: 14.7 % (ref 11.5–15.5)
WBC: 15 10*3/uL — ABNORMAL HIGH (ref 4.0–10.5)
nRBC: 0 % (ref 0.0–0.2)

## 2021-03-31 LAB — RESP PANEL BY RT-PCR (FLU A&B, COVID) ARPGX2
Influenza A by PCR: NEGATIVE
Influenza B by PCR: NEGATIVE
SARS Coronavirus 2 by RT PCR: NEGATIVE

## 2021-03-31 LAB — BASIC METABOLIC PANEL
Anion gap: 9 (ref 5–15)
BUN: 25 mg/dL — ABNORMAL HIGH (ref 8–23)
CO2: 24 mmol/L (ref 22–32)
Calcium: 8.4 mg/dL — ABNORMAL LOW (ref 8.9–10.3)
Chloride: 105 mmol/L (ref 98–111)
Creatinine, Ser: 2.08 mg/dL — ABNORMAL HIGH (ref 0.44–1.00)
GFR, Estimated: 24 mL/min — ABNORMAL LOW (ref >60)
Glucose, Bld: 105 mg/dL — ABNORMAL HIGH (ref 70–99)
Potassium: 3.9 mmol/L (ref 3.5–5.1)
Sodium: 138 mmol/L (ref 135–145)

## 2021-03-31 LAB — URINALYSIS, ROUTINE W REFLEX MICROSCOPIC
Glucose, UA: NEGATIVE mg/dL
Ketones, ur: NEGATIVE mg/dL
Nitrite: POSITIVE — AB
Protein, ur: >300 mg/dL — AB
Specific Gravity, Urine: 1.025 (ref 1.005–1.030)
pH: 5.5 (ref 5.0–8.0)

## 2021-03-31 LAB — CBC
HCT: 36.5 % (ref 36.0–46.0)
Hemoglobin: 11 g/dL — ABNORMAL LOW (ref 12.0–15.0)
MCH: 27.4 pg (ref 26.0–34.0)
MCHC: 30.1 g/dL (ref 30.0–36.0)
MCV: 91 fL (ref 80.0–100.0)
Platelets: 450 10*3/uL — ABNORMAL HIGH (ref 150–400)
RBC: 4.01 MIL/uL (ref 3.87–5.11)
RDW: 15.1 % (ref 11.5–15.5)
WBC: 10.5 10*3/uL (ref 4.0–10.5)
nRBC: 0 % (ref 0.0–0.2)

## 2021-03-31 LAB — PROTIME-INR
INR: 2 — ABNORMAL HIGH (ref 0.8–1.2)
Prothrombin Time: 22.6 seconds — ABNORMAL HIGH (ref 11.4–15.2)

## 2021-03-31 LAB — URINALYSIS, MICROSCOPIC (REFLEX)
RBC / HPF: >50 RBC/hpf (ref 0–5)
Squamous Epithelial / HPF: NONE SEEN (ref 0–5)

## 2021-03-31 LAB — LACTIC ACID, PLASMA: Lactic Acid, Venous: 0.9 mmol/L (ref 0.5–1.9)

## 2021-03-31 LAB — APTT: aPTT: 49 seconds — ABNORMAL HIGH (ref 24–36)

## 2021-03-31 MED ORDER — SODIUM CHLORIDE 0.9 % IV SOLN
1.0000 g | INTRAVENOUS | Status: DC
  Administered 2021-04-01 – 2021-04-02 (×2): 1 g via INTRAVENOUS
  Filled 2021-03-31 (×2): qty 1

## 2021-03-31 MED ORDER — TRAMADOL HCL 50 MG PO TABS: 50.0000 mg | ORAL_TABLET | Freq: Four times a day (QID) | ORAL | Status: DC | PRN

## 2021-03-31 MED ORDER — ONDANSETRON HCL 4 MG/2ML IJ SOLN: 4.0000 mg | Freq: Four times a day (QID) | INTRAMUSCULAR | Status: DC | PRN

## 2021-03-31 MED ORDER — POLYETHYLENE GLYCOL 3350 17 G PO PACK: 17.0000 g | PACK | Freq: Every day | ORAL | Status: DC | PRN

## 2021-03-31 MED ORDER — SODIUM CHLORIDE 0.9% FLUSH
3.0000 mL | Freq: Two times a day (BID) | INTRAVENOUS | Status: DC
  Administered 2021-03-31 – 2021-04-01 (×3): 3 mL via INTRAVENOUS

## 2021-03-31 MED ORDER — SODIUM CHLORIDE 0.9 % IV SOLN
2.0000 g | Freq: Once | INTRAVENOUS | Status: AC
  Administered 2021-03-31: 2 g via INTRAVENOUS
  Filled 2021-03-31: qty 20

## 2021-03-31 MED ORDER — ACETAMINOPHEN 650 MG RE SUPP: 650.0000 mg | Freq: Four times a day (QID) | RECTAL | Status: DC | PRN

## 2021-03-31 MED ORDER — HYDROXYUREA 500 MG PO CAPS
500.0000 mg | ORAL_CAPSULE | ORAL | Status: DC
  Administered 2021-04-01: 500 mg via ORAL
  Filled 2021-03-31: qty 1

## 2021-03-31 MED ORDER — HYDROMORPHONE HCL 1 MG/ML IJ SOLN
0.5000 mg | INTRAMUSCULAR | Status: DC | PRN
Start: 2021-03-31 — End: 2021-04-02

## 2021-03-31 MED ORDER — FLUCONAZOLE 100 MG PO TABS
200.0000 mg | ORAL_TABLET | Freq: Every day | ORAL | Status: DC
  Administered 2021-04-01 – 2021-04-02 (×2): 200 mg via ORAL
  Filled 2021-03-31 (×3): qty 2

## 2021-03-31 MED ORDER — APIXABAN 5 MG PO TABS
5.0000 mg | ORAL_TABLET | Freq: Two times a day (BID) | ORAL | Status: DC
  Administered 2021-03-31 – 2021-04-02 (×5): 5 mg via ORAL
  Filled 2021-03-31 (×6): qty 1

## 2021-03-31 MED ORDER — FLUCONAZOLE 100 MG PO TABS
400.0000 mg | ORAL_TABLET | Freq: Once | ORAL | Status: AC
  Administered 2021-03-31: 400 mg via ORAL
  Filled 2021-03-31: qty 2

## 2021-03-31 MED ORDER — LACTATED RINGERS IV SOLN: INTRAVENOUS | Status: AC

## 2021-03-31 MED ORDER — SODIUM CHLORIDE 0.9 % IV BOLUS
1000.0000 mL | Freq: Once | INTRAVENOUS | Status: AC
  Administered 2021-03-31: 1000 mL via INTRAVENOUS

## 2021-03-31 MED ORDER — AMITRIPTYLINE HCL 25 MG PO TABS
25.0000 mg | ORAL_TABLET | Freq: Every day | ORAL | Status: DC
  Administered 2021-03-31 – 2021-04-01 (×2): 25 mg via ORAL
  Filled 2021-03-31 (×2): qty 1

## 2021-03-31 MED ORDER — FLECAINIDE ACETATE 50 MG PO TABS
75.0000 mg | ORAL_TABLET | Freq: Two times a day (BID) | ORAL | Status: DC
  Administered 2021-03-31 – 2021-04-02 (×5): 75 mg via ORAL
  Filled 2021-03-31 (×5): qty 2

## 2021-03-31 MED ORDER — ONDANSETRON HCL 4 MG PO TABS: 4.0000 mg | ORAL_TABLET | Freq: Four times a day (QID) | ORAL | Status: DC | PRN

## 2021-03-31 MED ORDER — ACETAMINOPHEN 325 MG PO TABS
650.0000 mg | ORAL_TABLET | Freq: Four times a day (QID) | ORAL | Status: DC | PRN
  Administered 2021-03-31: 650 mg via ORAL
  Filled 2021-03-31: qty 2

## 2021-03-31 MED ORDER — PANTOPRAZOLE SODIUM 40 MG PO TBEC
40.0000 mg | DELAYED_RELEASE_TABLET | Freq: Every day | ORAL | Status: DC
  Administered 2021-03-31 – 2021-04-02 (×3): 40 mg via ORAL
  Filled 2021-03-31 (×3): qty 1

## 2021-03-31 NOTE — Progress Notes (Signed)
Pharmacy Antibiotic Note  [name] [name] is a 79 y.o. female admitted on 03/30/2021 with fever.  Patient had cystoscopy and ureteroscopy with stent exchange 6/3.  6/4 Urine culture=>=100,000 colonies/ml yeast.  Pharmacy has been consulted for Diflucan dosing.  Plan: Diflucan 400 mg po x 1 today then 200 mg po qday x 2 days (duration per Urology)  Height: 5\' 7"  (170.2 cm) Weight: 86.2 kg (190 lb) IBW/kg (Calculated) : 61.6  Temp (24hrs), Avg:98.4 F (36.9 C), Min:98.4 F (36.9 C), Max:98.4 F (36.9 C)  Recent Labs  Lab 03/29/21 1005 03/30/21 2315  WBC 11.2* 15.0*  CREATININE 1.92* 2.20*  LATICACIDVEN  --  0.9    Estimated Creatinine Clearance: 23.4 mL/min (A) (by C-G formula based on SCr of 2.2 mg/dL (H)).    Allergies  Allergen Reactions  . Codeine Nausea Only and Other (See Comments)    hallucinations  . Hydrocodone Nausea Only and Other (See Comments)    Antimicrobials this admission: 6/5 CTX x 1 6/5 Diflucan >> (6/7)   Microbiology results: 6/4 BCx: sent 6/4 UCx: sent  6/3 UCx: >=100,000 colonies/ml yeast  Thank you for allowing pharmacy to be a part of this patient's care.  Efraim Kaufmann, PharmD, BCPS 03/31/2021 9:53 AM

## 2021-03-31 NOTE — Progress Notes (Signed)
Dr Neysa Bonito aware to make Dr Louis Meckel aware pt's Lt uretal stent came out the last time she used BR. Awaiting response. Denies discomfort or pain.

## 2021-03-31 NOTE — Consult Note (Signed)
I have been asked to see the patient by Dr. Marva Panda, for evaluation and management of fevers w/ concern for infection s/p ureteroscopy.  History of present illness: 80 year old female with a long history of urinary tract infections including quite severe kidney infections.  In 2020 the patient had a right nephrectomy for a chronically infected kidney.  She recovered and did well from this and her renal function was essentially normal.  However, she started to develop recurrent urinary tract infections, and had a known stone in her left kidney.  There was some question as to whether she may have a stone at the distal right ureteral stump.  As such she was taken to the operating room to clear her stones and evaluate the stump.  The patient had been on nitrofurantoin preoperatively.  Cultures from that procedure demonstrated >100K CFU of yeast.  However, her stent was changed at the time of the surgery.  Several hours after the patient was discharged from the PACU following the surgery her husband called me because the patient had rigors.  She subsequently spiked a fever.  I encouraged the patient to proceed to the emergency department at that time, but he was comfortable managing her at home with the [facility] that he had on hand as well as ibuprofen and Tylenol for fevers.  She was otherwise stable with no blood pressure issues and her heart rate was in the 90s.  On postop day number 1 in the afternoon the patient's husband called me again explaining that he had documented a fever of 104 Fahrenheit.  At that time, I strongly encouraged the patient to proceed to the [facility] emergency department for further evaluation and management.  In the emergency department the patient was noted to be afebrile and hemodynamically stable.  She did have an elevated white blood cell count of 16,000.  In addition she was noted to have slight worsening of her renal function compared to her preop creatinine.  Both of  these numbers were significantly worse than what had been documented 3 weeks prior.  A CT scan was performed which demonstrated mild right hydroureteronephrosis.  The stent had migrated up into the ureter, with a small tail still within the bladder.  The patient was making significant amounts of urine.  She was not complaining of any flank pain.  Review of systems: A 12 point comprehensive review of systems was obtained and is negative unless otherwise stated in the history of present illness.  Patient Active Problem List   Diagnosis Date Noted  . Complicated UTI (urinary tract infection) 03/31/2021  . Cancer ([place])   . Complication of anesthesia   . Hypertension   . Essential thrombocythemia ([place]) 03/04/2021  . Ventricular ectopy 12/28/2020  . Presence of pessary   . Wears glasses   . Uses walker   . Right wrist fracture   . PAF (paroxysmal atrial fibrillation) ([place])   . Lumbar compression fracture ([place])   . Iron deficiency anemia   . Hyperlipidemia   . History of uterine cancer   . History of septic shock   . History of peptic ulcer disease   . History of kidney infection   . History of kidney stones   . History of gastric polyp   . History of colon polyps   . History of arteriovenostomy for renal dialysis ([place])   . Hiatal hernia   . Hematuria   . GERD (gastroesophageal reflux disease)   . General weakness   . First  degree heart block   . Cystocele, midline   . Chronic diastolic (congestive) heart failure ([place])   . Anticoagulant long-term use   . Nonfunctioning kidney 03/23/2020  . Urinary tract infection without hematuria   . Occlusion of ureteral stent ([place])   . Chronic anticoagulation 08/04/2017  . Coronary artery disease 06/16/2017  . Encounter for monitoring flecainide therapy 07/30/2016  . Ureteral stricture, right 05/07/2016  . Sepsis ([place]) 05/07/2016  . Hydronephrosis 05/07/2016  . Diverticulosis 05/07/2016  . Abdominal aortic atherosclerosis ([place]) 05/07/2016   . Congenital vaginal enterocele 04/15/2016  . Female genital prolapse 04/15/2016  . High risk medication use 04/15/2016  . Hyperlipemia 04/15/2016  . Midline cystocele 04/15/2016  . Palpitations 04/15/2016  . Tendonitis, Achilles 04/15/2016  . Paroxysmal atrial fibrillation ([place]) 05/31/2015  . Chronic diastolic congestive heart failure ([place]) 05/31/2015  . Dyslipidemia 05/31/2015  . Diarrhea 06/11/2013  . E coli bacteremia 06/08/2013  . Anemia, unspecified 06/08/2013  . NSTEMI, initial episode of care ([place]) 06/07/2013  . Septic shock(785.52) 06/04/2013  . Acute respiratory failure with hypoxia ([place]) 06/04/2013  . Pyelonephritis 06/04/2013  . Ureteral obstruction, right 06/04/2013  . Acute renal failure ([place]) 06/04/2013  . Lactic acidosis 06/04/2013  . History of ventilator dependency ([place]) 2014  . Chronic kidney disease 2014    No current facility-administered medications on file prior to encounter.   Current Outpatient Medications on File Prior to Encounter  Medication Sig Dispense Refill  . acetaminophen (TYLENOL) 500 MG tablet Take 1,000 mg by mouth every 6 (six) hours as needed for moderate pain or headache.     [name] amitriptyline (ELAVIL) 25 MG tablet Take 25 mg by mouth at bedtime.    [name] apixaban (ELIQUIS) 5 MG TABS tablet Take 5 mg by mouth 2 (two) times daily.    . flecainide (TAMBOCOR) 50 MG tablet Take 1.5 tablets (75 mg total) by mouth 2 (two) times daily. 270 tablet 2  . hydroxyurea (HYDREA) 500 MG capsule Take 500 mg by mouth every Monday, Wednesday, and Friday. May take with food to minimize GI side effects.    . nitrofurantoin, macrocrystal-monohydrate, (MACROBID) 100 MG capsule Take 1 capsule (100 mg total) by mouth 2 (two) times daily for 3 days. To prevent infection with tethered stent in place. 6 capsule 0  . omeprazole (PRILOSEC) 40 MG capsule Take 40 mg by mouth daily.     . ondansetron (ZOFRAN-ODT) 4 MG disintegrating tablet Take 4 mg by mouth every 6 (six) hours  as needed for nausea/vomiting.    . promethazine (PHENERGAN) 25 MG tablet Take 25 mg by mouth every 8 (eight) hours as needed.    . traMADol (ULTRAM) 50 MG tablet Take 1-2 tablets (50-100 mg total) by mouth every 6 (six) hours as needed for moderate pain (Post-operatively). 20 tablet 0  . triamcinolone cream (KENALOG) 0.1 % Apply 1 application topically 2 (two) times daily as needed (eczema).      Past Medical History:  Diagnosis Date  . Abdominal aortic atherosclerosis ([place]) 05/07/2016   Noted on CT abd/pelvis  . Acute renal failure ([place]) 06/04/2013  . Acute respiratory failure with hypoxia ([place]) 06/04/2013  . Anemia, unspecified 06/08/2013  . Anticoagulant long-term use    ELIQUIS  . Cancer [facility])    uterine cancer   . Chronic anticoagulation 08/04/2017  . Chronic diastolic (congestive) heart failure (HCC)     pt denies   . Chronic diastolic congestive heart failure ([place]) 05/31/2015  . Chronic kidney disease 2014  arf on dialysis in icu  . Complication of anesthesia    2021- kidney removed at [facility] pt reports did not get enough med to wake her up and she could not talk and then recovered after given more of wake up med from anesthesia   . Congenital vaginal enterocele 04/15/2016  . Coronary artery disease   . Cystocele, midline    followed by dr c. [facility] in [place])  pt uses pessary  . Diarrhea 06/11/2013  . Diverticulosis 05/07/2016   Noted on CT abd/pelvis  . Dyslipidemia 05/31/2015  . E coli bacteremia 06/08/2013  . Encounter for monitoring flecainide therapy 07/30/2016  . Essential thrombocythemia ([place]) 03/04/2021  . Female genital prolapse 04/15/2016  . First degree heart block   . General weakness   . GERD (gastroesophageal reflux disease)   . Hematuria    intermittently due to ureter right stent  . Hiatal hernia   . High risk medication use 04/15/2016  . History of arteriovenostomy for renal dialysis ([place])   . History of colon polyps   .  History of gastric polyp   . History of kidney infection    12-31-2018  perinephrtic abscess  right side  s/p drains x2 01-10-2019,  05/ 2020 drains removed  . History of kidney stones   . History of peptic ulcer disease   . History of septic shock    2011 (pt unaware) and 06-04-2013  w/ acute renal failure  . History of uterine cancer    STAGE I  --  S/P TAH W/ BSO  (NO OTHER TX)  . History of ventilator dependency ([place]) 2014   in setting of septic shock  . Hydronephrosis 05/07/2016  . Hyperlipemia 04/15/2016  . Hyperlipidemia   . Hypertension   . Iron deficiency anemia hematology/ oncology--- dr Bobby Rumpf at [facility] in [place]--- per lov note 08-07-2017  stabilized and felt to be more chronic anemia   01/ 2018  dx iron def. anemia  s/p  IV Iron infusion and taking oral iron supplement  . Lactic acidosis 06/04/2013  . Lumbar compression fracture ([place])    01-22-2019 per imaging L1  (06-01-2019 per pt currently no pain)  . Midline cystocele 04/15/2016  . Nonfunctioning kidney 03/23/2020  . NSTEMI, initial episode of care ([place]) 06/07/2013   pt denies   . Occlusion of ureteral stent ([place])   . PAF (paroxysmal atrial fibrillation) ([place]) DX JUNE 2012   CARDIOLOGIST--  DR Vernie Shanks--- [facility])  CHADS2  . Palpitations 04/15/2016  . Paroxysmal atrial fibrillation ([place]) 08/04/2017  . Presence of pessary   . Pyelonephritis 06/04/2013  . Right wrist fracture    per pt fractured on 03-10-2019,  had closed reduction and wearing a brace  . Sepsis ([place]) 05/07/2016  . Septic shock(785.52) 06/04/2013  . Tendonitis, Achilles 04/15/2016  . Ureteral obstruction, right 06/04/2013  . Ureteral stricture, right urologist-- dr Tresa Moore   Chronic--- treated with ureteral stent  . Urinary tract infection without hematuria   . Uses walker    and wheelchair for longer distance  . Ventricular ectopy 12/28/2020  . Wears glasses     Past Surgical History:  Procedure Laterality  Date  . APPENDECTOMY    . BACK SURGERY    . CARDIOVERSION  09/ 2018   dr Agustin Cree   successful ([facility] )  . CHOLECYSTECTOMY  2010  . COLONOSCOPY W/ POLYPECTOMY  05/2017  . CYSTO/  BALLOON DILATION RIGHT  URETERAL STRICTURE/ STENT PLACEMENT  06-02-2013  . CYSTO/  BILATERAL RETROGRADE PYELOGRAM/ RIGHT URETEROSCOPY AND STENT PLACEMENT  10-04-2005  . CYSTO/ LEFT URETEROSCOPIC STONE EXTRACTION  04-03-2006  . CYSTOSCOPY W/ RETROGRADES Right 01/03/2019   Procedure: CYSTOSCOPY WITH RETROGRADE PYELOGRAM RIGHT WITH STENT EXCHANGE;  Surgeon: Irine Seal, MD;  Location: [facility];  Service: Urology;  Laterality: Right;  . CYSTOSCOPY W/ URETERAL STENT PLACEMENT Right 02/06/2014   Procedure: CYSTOSCOPY WITH RIGHT RETROGRADE PYELOGRAM RIGHT STENT REMOVAL and REPLACEMENT, Right Ureteroscopy;  Surgeon: Ailene Rud, MD;  Location: [facility];  Service: Urology;  Laterality: Right;  . CYSTOSCOPY W/ URETERAL STENT PLACEMENT Right 11/26/2016   Procedure: CYSTOSCOPY WITH RETROGRADE PYELOGRAM/URETERAL STENT REPLACEMENT;  Surgeon: Alexis Frock, MD;  Location: [facility];  Service: Urology;  Laterality: Right;  . CYSTOSCOPY W/ URETERAL STENT PLACEMENT Right 04/24/2017   Procedure: CYSTOSCOPY WITH RETROGRADE PYELOGRAM/URETERAL STENT REPLACEMENT;  Surgeon: Alexis Frock, MD;  [facility];  Service: Urology;  Laterality: Right;  . CYSTOSCOPY W/ URETERAL STENT PLACEMENT Right 10/07/2017   Procedure: CYSTOSCOPY WITH RETROGRADE PYELOGRAM/URETERAL STENT EXCHANGE;  Surgeon: Alexis Frock, MD;  Location: [facility];  Service: Urology;  Laterality: Right;  . CYSTOSCOPY W/ URETERAL STENT PLACEMENT Right 03/03/2018   Procedure: CYSTOSCOPY WITH RIGHT RETROGRADE / RIGHT URETERAL STENT EXCHANGE;  Surgeon: Alexis Frock, MD;  [facility];  Service: Urology;  Laterality: Right;  . CYSTOSCOPY W/ URETERAL STENT PLACEMENT Right 09/22/2018   Procedure:  CYSTOSCOPY WITH RETROGRADE PYELOGRAM/URETERAL STENT PLACEMENT;  Surgeon: Alexis Frock, MD;  Location: [facility];  Service: Urology;  Laterality: Right;  45 MINS  . CYSTOSCOPY W/ URETERAL STENT PLACEMENT Right 06/03/2019   Procedure: CYSTOSCOPY WITH RETROGRADE PYELOGRAM/URETERAL STENT REPLACEMENT;  Surgeon: Alexis Frock, MD;  Location: [facility];  Service: Urology;  Laterality: Right;  . CYSTOSCOPY W/ URETERAL STENT PLACEMENT Right 11/23/2019   Procedure: CYSTOSCOPY WITH RETROGRADE PYELOGRAM/URETERAL STENT PLACEMENT;  Surgeon: Alexis Frock, MD;  Location: [facility];  Service: Urology;  Laterality: Right;  45 MINS  . CYSTOSCOPY WITH LITHOLAPAXY N/A 11/21/2013   Procedure: CYSTOSCOPY WITH LITHOLAPAXY;  Surgeon: Ailene Rud, MD;  Location: [facility];  Service: Urology;  Laterality: N/A;  . CYSTOSCOPY WITH RETROGRADE PYELOGRAM, URETEROSCOPY AND STENT PLACEMENT Bilateral 03/15/2014   Procedure: CYSTOSCOPY WITH BILATERAL RETROGRADE PYELOGRAM, RIGHT DIAGNOSTIC URETEROSCOPY AND STENT EXCHANGE;  Surgeon: Alexis Frock, MD;  [facility];  Service: Urology;  Laterality: Bilateral;  . CYSTOSCOPY WITH RETROGRADE PYELOGRAM, URETEROSCOPY AND STENT PLACEMENT Left 03/13/2021   Procedure: FIRST STAGE: CYSTOSCOPY WITH RETROGRADE PYELOGRAM, URETEROSCOPY AND STENT PLACEMENT;  Surgeon: Alexis Frock, MD;  Location: [facility];  Service: Urology;  Laterality: Left;  75 MINS  . CYSTOSCOPY WITH STENT PLACEMENT Right 05/07/2016   Procedure: CYSTOSCOPY WITH RETROGRADE PYELOGRAM, URETERAL STENT PLACEMENT;  Surgeon: Franchot Gallo, MD;  Location[facility];  Service: Urology;  Laterality: Right;  . EYE SURGERY     Bilateral cataract removal  . HEMIARTHROPLASTY HIP Right 11/2016   fractured  . HOLMIUM LASER APPLICATION Left 3/78/5885   Procedure: HOLMIUM LASER APPLICATION;  Surgeon: Alexis Frock, MD;  Location: [facility];  Service: Urology;   Laterality: Left;  . IR CATHETER TUBE CHANGE  02/04/2019  . IR CATHETER TUBE CHANGE  02/04/2019  . IR CATHETER TUBE CHANGE  02/11/2019  . IR RADIOLOGIST EVAL & MGMT  03/09/2019  . JOINT REPLACEMENT    . LUMBAR FUSION  JULY 2013  .  NEPHROLITHOTOMY  X2 YRS AGO  . PERCUTANEOUS NEPHROSTOMY  12/2018  . ROBOT ASSISTED LAPAROSCOPIC NEPHRECTOMY Right 03/23/2020   Procedure: XI ROBOTIC ASSISTED LAPAROSCOPIC NEPHRECTOMY;  Surgeon: Alexis Frock, MD;  Location: [facility];  Service: Urology;  Laterality: Right;  . TOTAL ABDOMINAL HYSTERECTOMY W/ BILATERAL SALPINGOOPHORECTOMY  1989  . TOTAL KNEE ARTHROPLASTY Bilateral 1992  &  1996  . TRANSTHORACIC ECHOCARDIOGRAM  07-24-2017   dr Agustin Cree   ef 60-65% (improved from last echo 2014, was 45-50%)/  trace TR/  mild AV sclerosis without stenosis  . URETEROSCOPY Right 11/21/2013   Procedure: URETEROSCOPY WITH STENT REMOVAL AND REPLACEMENT;  Surgeon: Ailene Rud, MD;  Location: [facility];  Service: Urology;  Laterality: Right;    Social History   Tobacco Use  . Smoking status: Never Smoker  . Smokeless tobacco: Never Used  Vaping Use  . Vaping Use: Never used  Substance Use Topics  . Alcohol use: No  . Drug use: No    Family History  Problem Relation Age of Onset  . Heart disease Mother   . Heart failure Mother   . Cancer Mother   . Heart disease Father   . Heart failure Father     PE: Vitals:   03/31/21 0400 03/31/21 0500 03/31/21 0535 03/31/21 0815  BP: (!) 156/73 102/87 (!) 166/84 (!) 159/78  Pulse: 73 73 83 83  Resp: 19 15 15 16   Temp:      TempSrc:      SpO2: 98% 99% 100% 97%  Weight:      Height:       Patient appears to be in no acute distress  patient is alert and oriented x3 Atraumatic normocephalic head No cervical or supraclavicular lymphadenopathy appreciated No increased work of breathing, no audible wheezes/rhonchi Regular sinus rhythm/rate Abdomen is soft, nontender, nondistended, mild right  suprapubic and flank tenderness Lower extremities are symmetric without appreciable edema Grossly neurologically intact No identifiable skin lesions  Recent Labs    03/29/21 1005 03/30/21 2315  WBC 11.2* 15.0*  HGB 12.6 11.1*  HCT 41.0 36.3   Recent Labs    03/29/21 1005 03/30/21 2315  NA 138 136  K 5.8* 4.1  CL 107 106  CO2 20* 21*  GLUCOSE 107* 110*  BUN 22 25*  CREATININE 1.92* 2.20*  CALCIUM 9.0 8.4*   Recent Labs    03/30/21 2315  INR 2.0*   No results for input(s): LABURIN in the last 72 hours. Results for orders placed or performed during the hospital encounter of 03/30/21  Resp Panel by RT-PCR (Flu A&B, Covid) Nasopharyngeal Swab     Status: None   Collection Time: 03/30/21 11:39 PM   Specimen: Nasopharyngeal Swab; Nasopharyngeal(NP) swabs in vial transport medium  Result Value Ref Range Status   SARS Coronavirus 2 by RT PCR NEGATIVE NEGATIVE Final    Comment: (NOTE) SARS-CoV-2 target nucleic acids are NOT DETECTED.  The SARS-CoV-2 RNA is generally detectable in upper respiratory specimens during the acute phase of infection. The lowest concentration of SARS-CoV-2 viral copies this assay can detect is 138 copies/mL. A negative result does not preclude SARS-Cov-2 infection and should not be used as the sole basis for treatment or other patient management decisions. A negative result may occur with  improper specimen collection/handling, submission of specimen other than nasopharyngeal swab, presence of viral mutation(s) within the areas targeted by this assay, and inadequate number of viral copies(<138 copies/mL). A negative result must be combined with clinical  observations, patient history, and epidemiological information. The expected result is Negative.  Fact Sheet for Patients:  EntrepreneurPulse.com.au  Fact Sheet for Healthcare Providers:  IncredibleEmployment.be  This test is no t yet approved or cleared by  the [place] FDA and  has been authorized for detection and/or diagnosis of SARS-CoV-2 by FDA under an Emergency Use Authorization (EUA). This EUA will remain  in effect (meaning this test can be used) for the duration of the COVID-19 declaration under Section 564(b)(1) of the Act, 21 U.S.C.section 360bbb-3(b)(1), unless the authorization is terminated  or revoked sooner.       Influenza A by PCR NEGATIVE NEGATIVE Final   Influenza B by PCR NEGATIVE NEGATIVE Final    Comment: (NOTE) The Xpert Xpress SARS-CoV-2/FLU/RSV plus assay is intended as an aid in the diagnosis of influenza from Nasopharyngeal swab specimens and should not be used as a sole basis for treatment. Nasal washings and aspirates are unacceptable for Xpert Xpress SARS-CoV-2/FLU/RSV testing.  Fact Sheet for Patients: EntrepreneurPulse.com.au  Fact Sheet for Healthcare Providers: IncredibleEmployment.be  This test is not yet approved or cleared by the [place] FDA and has been authorized for detection and/or diagnosis of SARS-CoV-2 by FDA under an Emergency Use Authorization (EUA). This EUA will remain in effect (meaning this test can be used) for the duration of the COVID-19 declaration under Section 564(b)(1) of the Act, 21 U.S.C. section 360bbb-3(b)(1), unless the authorization is terminated or revoked.  Performed at [facility], [place] [address], [place], [place] [address]     Imaging: I independently reviewed the patient's CT scan and discussed it with the patient and her husband.  The findings are documented in the HPI.  She has stranding around the kidney with some mild hydro.  The stent appears to have migrated up into the ureter with a small little curl remaining in the bladder.  Imp: The patient has persistent infection status post ureteroscopy and stone removal in her left collecting system.  She has yeast growing from her cultures in the  operating room.  I do not think the Macrobid was strong enough antibiotic for her in the postoperative period when she developed fevers, and as such should be on a broader spectrum antibiotic.  Cultures from our office have shown repeat E. coli UTIs with resistance to ciprofloxacin and penicillins.  She has mild AKI and some ongoing hydronephrosis on her CAT scan.  She is hemodynamically stable.  Recommendations: The patient stent was exchanged, which is the most important aspect of her funguria treatment.  Treating her for 3 days should be adequate unless she has developed fungemia.  In addition, I would keep her on broad-spectrum antibiotics.   Her cultures are pending.   As a relates to the patient's CT scan, I would recommend IV fluids and a repeat ultrasound if she is not improving.  I suspect her hydronephrosis is secondary to inflammation and her recent surgery.  Her stent has migrated further up into the ureter, but there does appear to be a small tail that we should be able to grab to remove it without requiring ureteroscopy.  I am hopeful we can retrieve her stent in clinic.  I will let Dr. Tresa Moore know patient has been readmitted.  Ardis Hughs

## 2021-03-31 NOTE — H&P (Signed)
History and Physical        Hospital Admission Note Date: 03/31/2021  Patient name: [name] [name] [facility] Medical record number: [id number] Date of birth: 07-Sep-1941 Age: 80 y.o. Gender: female  PCP: [name], MD   Chief Complaint    Chief Complaint  Patient presents with  . Weakness  . Fever      HPI:   This is a 80 year old female with past medical history of AAA, chronic diastolic CHF, CAD, paroxysmal atrial fibrillation on Eliquis, hypertension, hyperlipidemia, s/p right nephrectomy for atrophic kidney with multiple prior severe infections, nephrolithiasis with recent left ureteroscopy with irrigation of stones and ureteral stent exchange with Dr. Tresa Moore on 6/3 who presented to the ED from home with complaints of weakness, fevers and rigors since the early evening of 6/3.  Temperature as high as 104 F and she has been treating with Tylenol. Urine has been very dark since her procedure.  Denies nausea, vomiting, diarrhea, abdominal pain, back pain, cough or shortness of breath.  No other complaints. Currently feeling well and initially was going to be discharged AMA but then decided to stay.   ED Course: Afebrile, hemodynamically stable, on room air. Notable Labs from 6/4: Sodium 136, K4.1, BUN 25, creatinine 2.2 (1.9 on 6/3), lactic acid 0.9, WBC 15.0, Hb 11.1, platelets 429, UA positive for infection. Notable Imaging: CT renal stone- left double-J ureteral stent in place although distal pigtail is centered at the left UVJ and does not seem to completely be inside the bladder, moderate left hydro with perinephric and periureteral inflammatory stranding, small residual left nephrolithiasis, COVID-19 negative.  Of note a urine culture from 03/29/2021 showed > 100 K colonies of yeast. Patient received ceftriaxone, 1 L NS bolus.    Vitals:   03/31/21 0535 03/31/21 0815  BP: (!) 166/84  (!) 159/78  Pulse: 83 83  Resp: 15 16  Temp:    SpO2: 100% 97%     Review of Systems:  Review of Systems  All other systems reviewed and are negative.   Medical/Social/Family History   Past Medical History: Past Medical History:  Diagnosis Date  . Abdominal aortic atherosclerosis ([place]) 05/07/2016   Noted on CT abd/pelvis  . Acute renal failure ([place]) 06/04/2013  . Acute respiratory failure with hypoxia ([place]) 06/04/2013  . Anemia, unspecified 06/08/2013  . Anticoagulant long-term use    ELIQUIS  . [facility])    uterine cancer   . Chronic anticoagulation 08/04/2017  . Chronic diastolic (congestive) heart failure (HCC)     pt denies   . Chronic diastolic congestive heart failure ([place]) 05/31/2015  . Chronic kidney disease 2014   arf on dialysis in icu  . Complication of anesthesia    2021- kidney removed at [facility] pt reports did not get enough med to wake her up and she could not talk and then recovered after given more of wake up med from anesthesia   . Congenital vaginal enterocele 04/15/2016  . Coronary artery disease   . Cystocele, midline    followed by dr c. Marvel Plan [facility] in [place])  pt uses pessary  . Diarrhea 06/11/2013  . Diverticulosis 05/07/2016   Noted on CT abd/pelvis  .  Dyslipidemia 05/31/2015  . E coli bacteremia 06/08/2013  . Encounter for monitoring flecainide therapy 07/30/2016  . Essential thrombocythemia ([place]) 03/04/2021  . Female genital prolapse 04/15/2016  . First degree heart block   . General weakness   . GERD (gastroesophageal reflux disease)   . Hematuria    intermittently due to ureter right stent  . Hiatal hernia   . High risk medication use 04/15/2016  . History of arteriovenostomy for renal dialysis ([place])   . History of colon polyps   . History of gastric polyp   . History of kidney infection    12-31-2018  perinephrtic abscess  right side  s/p drains x2 01-10-2019,  05/ 2020 drains removed  . History of kidney stones    . History of peptic ulcer disease   . History of septic shock    2011 (pt unaware) and 06-04-2013  w/ acute renal failure  . History of uterine cancer    STAGE I  --  S/P TAH W/ BSO  (NO OTHER TX)  . History of ventilator dependency ([place]) 2014   in setting of septic shock  . Hydronephrosis 05/07/2016  . Hyperlipemia 04/15/2016  . Hyperlipidemia   . Hypertension   . Iron deficiency anemia hematology/ oncology--- dr Bobby Rumpf at [facility] in [place]--- per lov note 08-07-2017  stabilized and felt to be more chronic anemia   01/ 2018  dx iron def. anemia  s/p  IV Iron infusion and taking oral iron supplement  . Lactic acidosis 06/04/2013  . Lumbar compression fracture ([place])    01-22-2019 per imaging L1  (06-01-2019 per pt currently no pain)  . Midline cystocele 04/15/2016  . Nonfunctioning kidney 03/23/2020  . NSTEMI, initial episode of care ([place]) 06/07/2013   pt denies   . Occlusion of ureteral stent ([place])   . PAF (paroxysmal atrial fibrillation) ([place]) DX JUNE 2012   CARDIOLOGIST--  DR Vernie Shanks--- [facility])  CHADS2  . Palpitations 04/15/2016  . Paroxysmal atrial fibrillation ([place]) 08/04/2017  . Presence of pessary   . Pyelonephritis 06/04/2013  . Right wrist fracture    per pt fractured on 03-10-2019,  had closed reduction and wearing a brace  . Sepsis ([place]) 05/07/2016  . Septic shock(785.52) 06/04/2013  . Tendonitis, Achilles 04/15/2016  . Ureteral obstruction, right 06/04/2013  . Ureteral stricture, right urologist-- dr Tresa Moore   Chronic--- treated with ureteral stent  . Urinary tract infection without hematuria   . Uses walker    and wheelchair for longer distance  . Ventricular ectopy 12/28/2020  . Wears glasses     Past Surgical History:  Procedure Laterality Date  . APPENDECTOMY    . BACK SURGERY    . CARDIOVERSION  09/ 2018   dr Agustin Cree   successful ([facility] )  . CHOLECYSTECTOMY  2010  . COLONOSCOPY W/ POLYPECTOMY  05/2017  . CYSTO/   BALLOON DILATION RIGHT URETERAL STRICTURE/ STENT PLACEMENT  06-02-2013  . CYSTO/  BILATERAL RETROGRADE PYELOGRAM/ RIGHT URETEROSCOPY AND STENT PLACEMENT  10-04-2005  . CYSTO/ LEFT URETEROSCOPIC STONE EXTRACTION  04-03-2006  . CYSTOSCOPY W/ RETROGRADES Right 01/03/2019   Procedure: CYSTOSCOPY WITH RETROGRADE PYELOGRAM RIGHT WITH STENT EXCHANGE;  Surgeon: Irine Seal, MD;  Location: [facility];  Service: Urology;  Laterality: Right;  . CYSTOSCOPY W/ URETERAL STENT PLACEMENT Right 02/06/2014   Procedure: CYSTOSCOPY WITH RIGHT RETROGRADE PYELOGRAM RIGHT STENT REMOVAL and REPLACEMENT, Right Ureteroscopy;  Surgeon: Ailene Rud, MD;  Location: [facility];  Service: Urology;  Laterality: Right;  . CYSTOSCOPY W/ URETERAL STENT PLACEMENT Right 11/26/2016   Procedure: CYSTOSCOPY WITH RETROGRADE PYELOGRAM/URETERAL STENT REPLACEMENT;  Surgeon: Alexis Frock, MD;  [facility];  Service: Urology;  Laterality: Right;  . CYSTOSCOPY W/ URETERAL STENT PLACEMENT Right 04/24/2017   Procedure: CYSTOSCOPY WITH RETROGRADE PYELOGRAM/URETERAL STENT REPLACEMENT;  Surgeon: Alexis Frock, MD;  Location: [facility];  Service: Urology;  Laterality: Right;  . CYSTOSCOPY W/ URETERAL STENT PLACEMENT Right 10/07/2017   Procedure: CYSTOSCOPY WITH RETROGRADE PYELOGRAM/URETERAL STENT EXCHANGE;  Surgeon: Alexis Frock, MD;  Location: [facility];  Service: Urology;  Laterality: Right;  . CYSTOSCOPY W/ URETERAL STENT PLACEMENT Right 03/03/2018   Procedure: CYSTOSCOPY WITH RIGHT RETROGRADE / RIGHT URETERAL STENT EXCHANGE;  Surgeon: Alexis Frock, MD;  Location: [facility];  Service: Urology;  Laterality: Right;  . CYSTOSCOPY W/ URETERAL STENT PLACEMENT Right 09/22/2018   Procedure: CYSTOSCOPY WITH RETROGRADE PYELOGRAM/URETERAL STENT PLACEMENT;  Surgeon: Alexis Frock, MD;  Location: [facility];  Service: Urology;  Laterality: Right;  45 MINS  .  CYSTOSCOPY W/ URETERAL STENT PLACEMENT Right 06/03/2019   Procedure: CYSTOSCOPY WITH RETROGRADE PYELOGRAM/URETERAL STENT REPLACEMENT;  Surgeon: Alexis Frock, MD;  Location[facility];  Service: Urology;  Laterality: Right;  . CYSTOSCOPY W/ URETERAL STENT PLACEMENT Right 11/23/2019   Procedure: CYSTOSCOPY WITH RETROGRADE PYELOGRAM/URETERAL STENT PLACEMENT;  Surgeon: Alexis Frock, MD;  Location: [facility];  Service: Urology;  Laterality: Right;  45 MINS  . CYSTOSCOPY WITH LITHOLAPAXY N/A 11/21/2013   Procedure: CYSTOSCOPY WITH LITHOLAPAXY;  Surgeon: Ailene Rud, MD;  Location: [facility]  Service: Urology;  Laterality: N/A;  . CYSTOSCOPY WITH RETROGRADE PYELOGRAM, URETEROSCOPY AND STENT PLACEMENT Bilateral 03/15/2014   Procedure: CYSTOSCOPY WITH BILATERAL RETROGRADE PYELOGRAM, RIGHT DIAGNOSTIC URETEROSCOPY AND STENT EXCHANGE;  Surgeon: Alexis Frock, MD;  Location: [facility];  Service: Urology;  Laterality: Bilateral;  . CYSTOSCOPY WITH RETROGRADE PYELOGRAM, URETEROSCOPY AND STENT PLACEMENT Left 03/13/2021   Procedure: FIRST STAGE: CYSTOSCOPY WITH RETROGRADE PYELOGRAM, URETEROSCOPY AND STENT PLACEMENT;  Surgeon: Alexis Frock, MD;  Location: [facility];  Service: Urology;  Laterality: Left;  75 MINS  . CYSTOSCOPY WITH STENT PLACEMENT Right 05/07/2016   Procedure: CYSTOSCOPY WITH RETROGRADE PYELOGRAM, URETERAL STENT PLACEMENT;  Surgeon: Franchot Gallo, MD;  Location: [facility];  Service: Urology;  Laterality: Right;  . EYE SURGERY     Bilateral cataract removal  . HEMIARTHROPLASTY HIP Right 11/2016   fractured  . HOLMIUM LASER APPLICATION Left 2/97/9892   Procedure: HOLMIUM LASER APPLICATION;  Surgeon: Alexis Frock, MD;  Location: [facility];  Service: Urology;  Laterality: Left;  . IR CATHETER TUBE CHANGE  02/04/2019  . IR CATHETER TUBE CHANGE  02/04/2019  . IR CATHETER TUBE CHANGE  02/11/2019  . IR RADIOLOGIST EVAL & MGMT  03/09/2019  . JOINT  REPLACEMENT    . LUMBAR FUSION  JULY 2013  . NEPHROLITHOTOMY  X2 YRS AGO  . PERCUTANEOUS NEPHROSTOMY  12/2018  . ROBOT ASSISTED LAPAROSCOPIC NEPHRECTOMY Right 03/23/2020   Procedure: XI ROBOTIC ASSISTED LAPAROSCOPIC NEPHRECTOMY;  Surgeon: Alexis Frock, MD;  Location: [facility];  Service: Urology;  Laterality: Right;  . TOTAL ABDOMINAL HYSTERECTOMY W/ BILATERAL SALPINGOOPHORECTOMY  1989  . TOTAL KNEE ARTHROPLASTY Bilateral 1992  &  1996  . TRANSTHORACIC ECHOCARDIOGRAM  07-24-2017   dr Agustin Cree   ef 60-65% (improved from last echo 2014, was 45-50%)/  trace TR/  mild AV sclerosis without stenosis  . URETEROSCOPY Right  11/21/2013   Procedure: URETEROSCOPY WITH STENT REMOVAL AND REPLACEMENT;  Surgeon: Ailene Rud, MD;  Location: [facility];  Service: Urology;  Laterality: Right;    Medications: Prior to Admission medications   Medication Sig Start Date End Date Taking? Authorizing Provider  acetaminophen (TYLENOL) 500 MG tablet Take 1,000 mg by mouth every 6 (six) hours as needed for moderate pain or headache.     [provider]  amitriptyline (ELAVIL) 25 MG tablet Take 25 mg by mouth at bedtime.    [provider]  apixaban (ELIQUIS) 5 MG TABS tablet Take 5 mg by mouth 2 (two) times daily.    [provider]  flecainide (TAMBOCOR) 50 MG tablet Take 1.5 tablets (75 mg total) by mouth 2 (two) times daily. 08/28/20   Park Liter, MD  hydroxyurea (HYDREA) 500 MG capsule Take 500 mg by mouth 3 (three) times a week. May take with food to minimize GI side effects.    [provider]  nitrofurantoin, macrocrystal-monohydrate, (MACROBID) 100 MG capsule Take 1 capsule (100 mg total) by mouth 2 (two) times daily for 3 days. To prevent infection with tethered stent in place. 03/29/21 04/01/21  Alexis Frock, MD  omeprazole (PRILOSEC) 40 MG capsule Take 40 mg by mouth daily.     [provider]  ondansetron (ZOFRAN-ODT) 4 MG  disintegrating tablet Take 4 mg by mouth every 6 (six) hours as needed for nausea/vomiting. 11/16/18   [provider]  traMADol (ULTRAM) 50 MG tablet Take 1-2 tablets (50-100 mg total) by mouth every 6 (six) hours as needed for moderate pain (Post-operatively). 03/13/21 03/13/22  Alexis Frock, MD  triamcinolone cream (KENALOG) 0.1 % Apply 1 application topically 2 (two) times daily as needed (eczema).    [provider]    Allergies:   Allergies  Allergen Reactions  . Codeine Nausea Only and Other (See Comments)    hallucinations  . Hydrocodone Nausea Only and Other (See Comments)    Social History:  reports that she has never smoked. She has never used smokeless tobacco. She reports that she does not drink alcohol and does not use drugs.  Family History: Family History  Problem Relation Age of Onset  . Heart disease Mother   . Heart failure Mother   . Cancer Mother   . Heart disease Father   . Heart failure Father      Objective   Physical Exam: Blood pressure (!) 159/78, pulse 83, temperature 98.4 F (36.9 C), temperature source Oral, resp. rate 16, height 5\' 7"  (1.702 m), weight 86.2 kg, SpO2 97 %.  Physical Exam Vitals and nursing note reviewed.  Constitutional:      Appearance: Normal appearance.  HENT:     Head: Normocephalic and atraumatic.  Eyes:     Conjunctiva/sclera: Conjunctivae normal.  Cardiovascular:     Rate and Rhythm: Normal rate and regular rhythm.  Pulmonary:     Effort: Pulmonary effort is normal.     Breath sounds: Normal breath sounds.  Abdominal:     General: Abdomen is flat.     Palpations: Abdomen is soft.  Genitourinary:    Comments: Urine is dark brown Musculoskeletal:        General: No swelling or tenderness.  Skin:    Coloration: Skin is not jaundiced or pale.  Neurological:     Mental Status: She is alert. Mental status is at baseline.  Psychiatric:        Mood and Affect:  Mood normal.        Behavior:  Behavior normal.     LABS on Admission: I have personally reviewed all the labs and imaging below    Basic Metabolic Panel: Recent Labs  Lab 03/29/21 1005 03/30/21 2315  NA 138 136  K 5.8* 4.1  CL 107 106  CO2 20* 21*  GLUCOSE 107* 110*  BUN 22 25*  CREATININE 1.92* 2.20*  CALCIUM 9.0 8.4*   Liver Function Tests: Recent Labs  Lab 03/30/21 2315  AST 16  ALT 11  ALKPHOS 57  BILITOT 0.5  PROT 7.0  ALBUMIN 2.8*   No results for input(s): LIPASE, AMYLASE in the last 168 hours. No results for input(s): AMMONIA in the last 168 hours. CBC: Recent Labs  Lab 03/29/21 1005 03/30/21 2315  WBC 11.2* 15.0*  NEUTROABS  --  12.2*  HGB 12.6 11.1*  HCT 41.0 36.3  MCV 89.7 90.1  PLT 565* 429*   Cardiac Enzymes: No results for input(s): CKTOTAL, CKMB, CKMBINDEX, TROPONINI in the last 168 hours. BNP: Invalid input(s): POCBNP CBG: No results for input(s): GLUCAP in the last 168 hours.  Radiological Exams on Admission:  DG Chest Port 1 View  Result Date: 03/30/2021 CLINICAL DATA:  Sepsis EXAM: PORTABLE CHEST 1 VIEW COMPARISON:  None. FINDINGS: The heart size and mediastinal contours are within normal limits. Both lungs are clear. The visualized skeletal structures are unremarkable. IMPRESSION: No active disease. Electronically Signed   By: Ulyses Jarred M.D.   On: 03/30/2021 23:58   DG C-Arm 1-60 Min-No Report  Result Date: 03/29/2021 Fluoroscopy was utilized by the requesting physician.  No radiographic interpretation.   CT RENAL STONE STUDY  Result Date: 03/31/2021 CLINICAL DATA:  80 year old female with weakness and fever. Status post cystoscopy yesterday, urinary tract stone. History of right nephrectomy, uterine cancer. EXAM: CT ABDOMEN AND PELVIS WITHOUT CONTRAST TECHNIQUE: Multidetector CT imaging of the abdomen and pelvis was performed following the standard protocol without IV contrast. COMPARISON:  CT Abdomen and Pelvis 01/08/2021 and earlier. FINDINGS: Lower chest:  Tortuous descending thoracic aorta appears stable since March. Borderline to mild cardiomegaly. No pericardial or pleural effusion. Mildly fibrotic bilateral lung base scarring greater on the left. Hepatobiliary: Chronically absent gallbladder. Negative noncontrast liver. Pancreas: Negative. Spleen: Negative. Adrenals/Urinary Tract: Normal adrenal glands. Surgically absent right kidney. Blind-ending right ureter at the pelvic inlet is mildly dilated but unchanged. Left double-J ureteral stent in place with distal pigtail at the ureterovesical junction and not completely inside the bladder on series 2, image 72. Trace gas within the urinary bladder. Left periureteral stranding. And left hydronephrosis, moderate (coronal image 90) with small volume of gas in the left renal collecting system. Associated moderate left perinephric inflammatory stranding. Staghorn calculus seen in March does not persist. There are several punctate and/or indistinct left renal collecting system calcifications now noted. Chronic upper pole simple fluid density cyst again noted. Stomach/Bowel: Increased stool ball in the rectum compared to March. Upstream sigmoid colon is nondilated with diverticulosis, but no active inflammation. Redundant transverse colon and hepatic flexure. No large bowel inflammation. Diminutive or absent appendix. Negative terminal ileum. No dilated small bowel. Decompressed stomach and duodenum. No free air or free fluid. Vascular/Lymphatic: Aortoiliac calcified atherosclerosis. Normal caliber abdominal aorta. Vascular patency is not evaluated in the absence of IV contrast. Reactive appearing left perinephric retroperitoneal lymph nodes, subcentimeter. No other lymphadenopathy. Reproductive: Stable pessary. Surgically absent uterus. Diminutive or absent ovaries. Other: No pelvic free fluid. Musculoskeletal: Osteopenia. Chronic lower  lumbar posterior and interbody fusion. Chronic L1 and L2 compression fractures appear  stable. Chronic right hip arthroplasty. No acute or suspicious osseous abnormality identified. IMPRESSION: 1. Left double-J ureteral stent in place although distal pigtail is centered at the left UVJ and does not seem to completely inside the bladder. Associated moderate left hydronephrosis with perinephric and periureteral inflammatory stranding. Small residual left nephrolithiasis. Small volume of gas in the left renal collecting system and urinary bladder. 2. Stable sequelae of right nephrectomy. 3. Increased stool ball in the rectum compared to March, consider mild fecal impaction. 4. Sigmoid diverticulosis without active inflammation. Aortic Atherosclerosis (ICD10-I70.0). Lung base scarring. Osteopenia with chronic L1 and L2 compression fractures. Electronically Signed   By: Genevie Ann M.D.   On: 03/31/2021 04:24      EKG: Not done   A & P   Principal Problem:   Complicated UTI (urinary tract infection) Active Problems:   Acute renal failure (HCC)   GERD (gastroesophageal reflux disease)   Anticoagulant long-term use   1. Complicated UTI in the setting of recent urologic procedure on 6/3, not septic a. [facility] urine culture from March 2022 positive for E. coli b. Urine culture from 6/3 positive for yeast c. Continue ceftriaxone for now pending culture from yesterday d. Will give 3 doses of fluconazole to treat yeast since she had a urologic procedure e. IV fluids f. Urology consulted  2. Nephrolithiasis with recent left ureteroscopy with irrigation of stones and ureteral stent exchange with Dr. Tresa Moore on 6/3 a. Urology consulted  3. Paroxysmal atrial fibrillation a. Continue home Eliquis and flecainide  4. AKI on CKD 3a a. Creatinine 2.20 on 6/4 which is increased from 1.9 the day prior.  Baseline creatinine 1.1 b. Continue IV fluids  5. GERD a. Continue PPI  6. Hypertension a. Not on antihypertensives     DVT prophylaxis: Eliquis   Code Status: Full Code   Diet: Heart healthy Family Communication: Admission, patients condition and plan of care including tests being ordered have been discussed with the patient who indicates understanding and agrees with the plan and Code Status. Patient's husband was updated  Disposition Plan: The appropriate patient status for this patient is INPATIENT. Inpatient status is judged to be reasonable and necessary in order to provide the required intensity of service to ensure the patient's safety. The patient's presenting symptoms, physical exam findings, and initial radiographic and laboratory data in the context of their chronic comorbidities is felt to place them at high risk for further clinical deterioration. Furthermore, it is not anticipated that the patient will be medically stable for discharge from the hospital within 2 midnights of admission. The following factors support the patient status of inpatient.   " The patient's presenting symptoms include fever, rigors. " The worrisome physical exam findings include dark urine. " The initial radiographic and laboratory data are worrisome because of leukocytosis and positive UA. " The chronic co-morbidities include nephrolithiasis and recent urologic procedure.   * I certify that at the point of admission it is my clinical judgment that the patient will require inpatient hospital care spanning beyond 2 midnights from the point of admission due to high intensity of service, high risk for further deterioration and high frequency of surveillance required.*   Status is: Inpatient  Remains inpatient appropriate because:IV treatments appropriate due to intensity of illness or inability to take PO and Inpatient level of care appropriate due to severity of illness   Dispo: The patient is from:  Home              Anticipated d/c is to: Home              Patient currently is not medically stable to d/c.   Difficult to place patient No       Consultants   . Urology  Procedures  . None  Time Spent on Admission: 63 minutes    Harold Hedge, DO [facility]  03/31/2021, 9:54 AM

## 2021-03-31 NOTE — Progress Notes (Signed)
Urine culture data from [facility] in March 2022:  Antimicrobial Susceptibility and Organism Identification Report - FINAL  Source     : URINE CULTURE  Iso/Result : 01     Escherichia coli  Antimicrobic/Dose                        MIC   SYSTEMIC   URINE  ---------------------------------------------------------------  Amp/Sulbactam                          >16/8       R        R  Amikacin                                 <16       S        S  Ampicillin                               >16       R        R  Amox/K Clav                             16/8       I        I  Aztreonam                                 <4       S        S  Ceftriaxone                               <1       S        S  Ceftazidime                               <1       S        S  Cefotaxime                                <2       S        S  Cefazolin                                  4       S        S  Ciprofloxacin                             >2       R        R  Cefepime                                  <2       S  S  Cefotetan                                <16       S        S  Ertapenem                               <0.5       S        S  Nitrofurantoin                           <32                S  Gentamicin                                <4       S        S  Levofloxacin                              >4       R        R  Meropenem                                 <1       S        S  Pip/Tazo                                 <16       S        S  Trimeth/Sulfa                          <2/38       S        S  Tetracycline                              <4       S        S  Tobramycin                                <4       S        S

## 2021-03-31 NOTE — Progress Notes (Signed)
Dr Louis Meckel responded recently to issue of lt uretal stent coming out. Pt denies pain but MD aware temp 101.3 and urine noted to be cherry-red to bloody red since admittance to floor. No new orders received. Dr Louis Meckel to check on pt in am.

## 2021-04-01 ENCOUNTER — Encounter (HOSPITAL_COMMUNITY): Payer: Self-pay | Admitting: Urology

## 2021-04-01 DIAGNOSIS — N390 Urinary tract infection, site not specified: Secondary | ICD-10-CM

## 2021-04-01 LAB — CBC
HCT: 34.5 % — ABNORMAL LOW (ref 36.0–46.0)
Hemoglobin: 10.6 g/dL — ABNORMAL LOW (ref 12.0–15.0)
MCH: 27.8 pg (ref 26.0–34.0)
MCHC: 30.7 g/dL (ref 30.0–36.0)
MCV: 90.6 fL (ref 80.0–100.0)
Platelets: 417 10*3/uL — ABNORMAL HIGH (ref 150–400)
RBC: 3.81 MIL/uL — ABNORMAL LOW (ref 3.87–5.11)
RDW: 15 % (ref 11.5–15.5)
WBC: 8.7 10*3/uL (ref 4.0–10.5)
nRBC: 0 % (ref 0.0–0.2)

## 2021-04-01 LAB — BASIC METABOLIC PANEL
Anion gap: 7 (ref 5–15)
BUN: 21 mg/dL (ref 8–23)
CO2: 23 mmol/L (ref 22–32)
Calcium: 8.3 mg/dL — ABNORMAL LOW (ref 8.9–10.3)
Chloride: 106 mmol/L (ref 98–111)
Creatinine, Ser: 1.59 mg/dL — ABNORMAL HIGH (ref 0.44–1.00)
GFR, Estimated: 33 mL/min — ABNORMAL LOW (ref >60)
Glucose, Bld: 94 mg/dL (ref 70–99)
Potassium: 4 mmol/L (ref 3.5–5.1)
Sodium: 136 mmol/L (ref 135–145)

## 2021-04-01 LAB — URINE CULTURE: Culture: 7000 — AB

## 2021-04-01 NOTE — Anesthesia Postprocedure Evaluation (Signed)
Anesthesia Post Note  Patient: [name] [name]  Procedure(s) Performed: SECOND STGAE:CYSTOSCOPY WITH RETROGRADE PYELOGRAM, URETEROSCOPY AND STENT EXCHANGE (Left )     Patient location during evaluation: PACU Anesthesia Type: General Level of consciousness: awake and alert, oriented and patient cooperative Pain management: pain level controlled Vital Signs Assessment: post-procedure vital signs reviewed and stable Respiratory status: spontaneous breathing, nonlabored ventilation and respiratory function stable Cardiovascular status: blood pressure returned to baseline and stable Postop Assessment: no apparent nausea or vomiting Anesthetic complications: no   No complications documented.  Last Vitals:  Vitals:   03/29/21 1300 03/29/21 1315  BP: (!) 137/93 (!) 157/79  Pulse: 72 73  Resp: 16 16  Temp:  (!) 36.3 C  SpO2: 96% 100%    Last Pain:  Vitals:   03/29/21 1315  TempSrc:   PainSc: [place]

## 2021-04-01 NOTE — Progress Notes (Signed)
Subjective/Chief Complaint:  1 - Left Large Renal Stone - s/p staged ureteroscopy finishing 03/29/2021 for large left renal stone in solitary kidney. Now stone free. Tethered stent removed 03/31/21.   2 -Solitary Left Kidney - s/p right nephrectomy previously for atrphic kidney with chronic infections  3 - Fungal Pyelonephritis - fevers, rigors, malaise after stone surgery 03/29/21. Intra-op renal fluid 6/3 CX yeast and UCX 6/4 yeast. BCS 6/4 NGTD. Placed on IV diflucan. Had been on antibiotics peri-op (NOT antifungals).   Today "[name] [name]" is much improved. Fever curve trending down (no high grade in 24 hours), much less maliase.    Objective: Vital signs in last 24 hours: Temp:  [98 F (36.7 C)-99.2 F (37.3 C)] 98 F (36.7 C) (06/06 1316) Pulse Rate:  [78-89] 78 (06/06 1316) Resp:  [16-18] 18 (06/06 1316) BP: (139-149)/(74-79) 145/74 (06/06 1316) SpO2:  [95 %-99 %] 99 % (06/06 1316) Last BM Date: 03/31/21  Intake/Output from previous day: 06/05 0701 - 06/06 0700 In: 1186.6 [P.O.:540; I.V.:546.6; IV Piggyback:100] Out: 1800 [Urine:1800] Intake/Output this shift: Total I/O In: 480 [P.O.:480] Out: 650 [Urine:650]  General appearance: alert, cooperative and no maliase. In good spirits. At baseline.  Eyes: negative Nose: Nares normal. Septum midline. Mucosa normal. No drainage or sinus tenderness. Throat: lips, mucosa, and tongue normal; teeth and gums normal Neck: supple, symmetrical, trachea midline Back: symmetric, no curvature. ROM normal. No CVA tenderness. Resp: Non-labored on room air.  Cardio: Nl rate GI: soft, non-tender; bowel sounds normal; no masses,  no organomegaly and prior scars w/o hernias.  Pelvic: external genitalia normal Extremities: extremities normal, atraumatic, no cyanosis or edema Skin: Skin color, texture, turgor normal. No rashes or lesions Lymph nodes: Cervical, supraclavicular, and axillary nodes normal.  Lab Results:  Recent Labs     03/31/21 0941 04/01/21 0422  WBC 10.5 8.7  HGB 11.0* 10.6*  HCT 36.5 34.5*  PLT 450* 417*   BMET Recent Labs    03/31/21 0941 04/01/21 0422  NA 138 136  K 3.9 4.0  CL 105 106  CO2 24 23  GLUCOSE 105* 94  BUN 25* 21  CREATININE 2.08* 1.59*  CALCIUM 8.4* 8.3*   PT/INR Recent Labs    03/30/21 2315  LABPROT 22.6*  INR 2.0*   ABG No results for input(s): PHART, HCO3 in the last 72 hours.  Invalid input(s): PCO2, PO2  Studies/Results: DG Chest Port 1 View  Result Date: 03/30/2021 CLINICAL DATA:  Sepsis EXAM: PORTABLE CHEST 1 VIEW COMPARISON:  None. FINDINGS: The heart size and mediastinal contours are within normal limits. Both lungs are clear. The visualized skeletal structures are unremarkable. IMPRESSION: No active disease. Electronically Signed   By: Ulyses Jarred M.D.   On: 03/30/2021 23:58   CT RENAL STONE STUDY  Result Date: 03/31/2021 CLINICAL DATA:  80 year old female with weakness and fever. Status post cystoscopy yesterday, urinary tract stone. History of right nephrectomy, uterine cancer. EXAM: CT ABDOMEN AND PELVIS WITHOUT CONTRAST TECHNIQUE: Multidetector CT imaging of the abdomen and pelvis was performed following the standard protocol without IV contrast. COMPARISON:  CT Abdomen and Pelvis 01/08/2021 and earlier. FINDINGS: Lower chest: Tortuous descending thoracic aorta appears stable since March. Borderline to mild cardiomegaly. No pericardial or pleural effusion. Mildly fibrotic bilateral lung base scarring greater on the left. Hepatobiliary: Chronically absent gallbladder. Negative noncontrast liver. Pancreas: Negative. Spleen: Negative. Adrenals/Urinary Tract: Normal adrenal glands. Surgically absent right kidney. Blind-ending right ureter at the pelvic inlet is mildly dilated but unchanged. Left double-J  ureteral stent in place with distal pigtail at the ureterovesical junction and not completely inside the bladder on series 2, image 72. Trace gas within the  urinary bladder. Left periureteral stranding. And left hydronephrosis, moderate (coronal image 90) with small volume of gas in the left renal collecting system. Associated moderate left perinephric inflammatory stranding. Staghorn calculus seen in March does not persist. There are several punctate and/or indistinct left renal collecting system calcifications now noted. Chronic upper pole simple fluid density cyst again noted. Stomach/Bowel: Increased stool ball in the rectum compared to March. Upstream sigmoid colon is nondilated with diverticulosis, but no active inflammation. Redundant transverse colon and hepatic flexure. No large bowel inflammation. Diminutive or absent appendix. Negative terminal ileum. No dilated small bowel. Decompressed stomach and duodenum. No free air or free fluid. Vascular/Lymphatic: Aortoiliac calcified atherosclerosis. Normal caliber abdominal aorta. Vascular patency is not evaluated in the absence of IV contrast. Reactive appearing left perinephric retroperitoneal lymph nodes, subcentimeter. No other lymphadenopathy. Reproductive: Stable pessary. Surgically absent uterus. Diminutive or absent ovaries. Other: No pelvic free fluid. Musculoskeletal: Osteopenia. Chronic lower lumbar posterior and interbody fusion. Chronic L1 and L2 compression fractures appear stable. Chronic right hip arthroplasty. No acute or suspicious osseous abnormality identified. IMPRESSION: 1. Left double-J ureteral stent in place although distal pigtail is centered at the left UVJ and does not seem to completely inside the bladder. Associated moderate left hydronephrosis with perinephric and periureteral inflammatory stranding. Small residual left nephrolithiasis. Small volume of gas in the left renal collecting system and urinary bladder. 2. Stable sequelae of right nephrectomy. 3. Increased stool ball in the rectum compared to March, consider mild fecal impaction. 4. Sigmoid diverticulosis without active  inflammation. Aortic Atherosclerosis (ICD10-I70.0). Lung base scarring. Osteopenia with chronic L1 and L2 compression fractures. Electronically Signed   By: Genevie Ann M.D.   On: 03/31/2021 04:24    Anti-infectives: Anti-infectives (From admission, onward)   Start     Dose/Rate Route Frequency Ordered Stop   04/01/21 1000  fluconazole (DIFLUCAN) tablet 200 mg        200 mg Oral Daily 03/31/21 0839 04/03/21 0959   04/01/21 0600  cefTRIAXone (ROCEPHIN) 1 g in sodium chloride 0.9 % 100 mL IVPB        1 g 200 mL/hr over 30 Minutes Intravenous Every 24 hours 03/31/21 1226     03/31/21 1000  fluconazole (DIFLUCAN) tablet 400 mg        400 mg Oral  Once 03/31/21 0837 03/31/21 1026   03/31/21 0500  cefTRIAXone (ROCEPHIN) 2 g in sodium chloride 0.9 % 100 mL IVPB        2 g 200 mL/hr over 30 Minutes Intravenous  Once 03/31/21 0458 03/31/21 0544      Assessment/Plan:  Agree with current IV antifungals, bridged to PO for approx 7 days total course for severe fungal pyelo. Fortunately all GU hardware and stones now out, so no nidus for recurrence. GREATLY appreciate hospitalist team comanagement. Pt OK for DC from GU perspective when afebrile x 24 hours (she is nearly there). She has f/u already arranged through our office. Please call me directly with questions anytime.   Alexis Frock 04/01/2021

## 2021-04-01 NOTE — Op Note (Signed)
NAMEONDINE, GEMME MEDICAL RECORD NO: [id number] ACCOUNT NO: [id number] DATE OF BIRTH: 26-Jun-1941 FACILITY: [facility] LOCATION: [facility] PHYSICIAN: Alexis Frock, MD  Operative Report   DATE OF PROCEDURE: 03/29/2021  PREOPERATIVE DIAGNOSES:  Solitary left kidney, recurrent urinary tract infections, left renal stone.  PROCEDURES:    1.  Cystoscopy with left retrograde pyelogram and interpretation. 2.  Left ureteroscopy with irrigation and basketing of stones. 3.  Replacement of left ureteral stent, 5 x 24 Polaris with tether. 4.  Right diagnostic ureteroscopy.  ESTIMATED BLOOD LOSS:  Nil.  COMPLICATIONS:  None.  SPECIMENS:  Left renal pelvis urine for Gram stain and culture.  FINDINGS: 1.  Minimal volume left intrarenal stone. 2.  Significant volume left renal pelvis proteinaceous debris that was irrigated and set aside for Gram stain and culture to maximally rule out infectious etiology. 3.  Successful replacement of left ureteral stent, proximal end in renal pelvis and distal end in urinary bladder. 4.  No evidence of intraluminal urolithiasis in the right ureteral stump.  INDICATIONS:  The patient is a very pleasant and unfortunate 80 year old lady with history of left solitary kidney and recurrent urolithiasis as well as recurrent complex urinary tract infections.  She has significant metabolic and functional  comorbidity.  She was found on surveillance of her solitary kidney to have significant volume recurrence of left renal stone at approximately 2 cm, although not obstructing.  This was at risk of causing obstruction to her solitary renal unit.  Options  were discussed including recommended staged ureteroscopy to render her stone free to avoid obstruction of her solitary kidney and she wished to proceed.  She underwent first procedure on 03/13/2021 in which the vast majority of her stone volume was  addressed. Given her solitary kidney and large volume stone, it was clearly  felt a second stage procedure was warranted to maximally rule out residual stone.  She presents for this today.  Informed consent was obtained and placed in the medical record.   She is on culture specific antibiotics preoperatively.  She does have a history of chronic and recurrent infections.  She has been fever free for 24 hours.  DESCRIPTION OF PROCEDURE:  The patient being [facility] verified.  Procedure being left second stage ureteroscopic stone manipulation was confirmed.  Procedure timeout was performed.  Intravenous antibiotics were administered.  General anesthesia  was induced.  The patient was placed into low lithotomy position.  A sterile field was created, prepped and draped the patient's vagina, introitus, and proximal thighs using iodine.  She has in-situ pessary.  Cystourethroscopy was performed using  21-French rigid scope with offset lens.  Inspection of the bladder revealed no diverticula, calcifications, or papillary lesions.  Ureteral orifice was singleton.  The distal end of the left stent was seen exiting the left ureteral orifice.  It was  grasped and brought to the level of the urethral meatus, and a 0.038 ZIPwire was advanced to the level of the upper pole, and the stent was exchanged for an open-ended catheter, and left retrograde pyelogram was obtained.  Left retrograde pyelogram demonstrated single left ureter, single system left kidney.  There was some mild tortuosity and chronic caliectasis.  No filling defects noted.  The ZIPwire was once again advanced as a safety wire, and an 8-French feeding tube  was placed in the urinary bladder for pressure release.  A semi-rigid ureteroscopy was performed of the entire length of left ureter alongside separate sensor working wire.  No mucosal abnormalities  or urolithiasis were noted.  The semi-rigid scope was  then exchanged for a 12/14 short-length ureteral access sheath at level of the proximal ureter using continuous  fluoroscopic guidance, and flexible digital ureteroscopy was performed in the proximal left ureter and systematic inspection of the left  kidney including all calices x3.  There was minimal volume stone material; however, there was a significant amount of proteinaceous debris within the kidney.  This was very carefully irrigated under a very low pressure technique. I did have some concern  that this could be infectious in etiology given her prior history.  Sample of this fluid was set aside for Gram stain and culture.  In the lower pole, there were several small stone fragments that were irrigated.  These were all less than one-third mm.   This was quite favorable in terms of her stone.  We achieved the goals of verifying left side stone free.  Access sheath was removed under continuous vision.  No significant mucosal abnormalities were found. Reevaluation of her preoperative imaging did  reveal a questionable stone in her right ureteral stump, and given her history of infections and the goal to maxillary remove all nidus, semi-rigid ureteroscopy was performed of the right ureteral stump alongside a separate sensor working wire.  This was  traced up to the level of approximately the iliac crossing or slightly above where there was blind ending.  There was no evidence of urolithiasis whatsoever in the right ureteral stump, no evidence of any exposed foreign body. Given solitary kidney, it  was felt that brief interval stenting with tethered stent would be most prudent.  As such, a new 5 x 24 Polaris type stent was placed over the safety wire on the left side using fluoroscopic guidance.  Good proximal and distal plane were noted.  Tether was left in place, trimmed to length and tucked per vagina, and the procedure was terminated.  The patient tolerated the procedure well.  No immediate perioperative complications.  The patient was taken to postanesthesia care in stable condition.  Plan for discharge home as  long as she does not exhibit any acute systemic inflammatory symptoms.   ROH D: 03/29/2021 12:33:55 pm T: 03/29/2021 1:57:00 pm  JOB: [id number]/ [id number]

## 2021-04-01 NOTE — Progress Notes (Addendum)
[name] [name]  PROGRESS NOTE    [name] [name]   WIO:[id number]  DOB: Mar 26, 1941  PCP: [name] Hipps, MD    DOA: 03/30/2021 LOS: 2   Assessment & Plan   Principal Problem:   Pyelonephritis of left kidney Active Problems:   Acute renal failure (HCC)   Complicated UTI (urinary tract infection)   Paroxysmal atrial fibrillation (HCC)   Chronic diastolic congestive heart failure (HCC)   Iron deficiency anemia   Anticoagulant long-term use   Hypertension   Coronary artery disease   Hyperlipidemia   GERD (gastroesophageal reflux disease)   Essential thrombocythemia ([place])   Complicated UTI with fungal pyelonephritis of left kidney s/p left ureteral stent placement - of note, patient's stent came out yesterday afternoon 6/5. Urology following.  Patient follows with Dr. Tresa [name].  Continue empiric Rocephin, fluconazole  Follow-up urine culture IV fluids  Nephrolithiasis with recent left ureteroscopy with irrigation of stones and ureteral stent exchange with Dr. Tresa [name] 6/3 -  Urology following  AKI superimposed on CKD stage IIIa -present on admission with creatinine 2.20, up from 1.9 the day before with baseline creatinine 1.1.  Suspect multifactorial prerenal azotemia and related to obstructive uropathy given above. Continue IV fluids.   Monitor BMP  GERD -continue PPI  Hypertension -chronic, stable.  Not on antihypertensives.  Monitor BP.  Essential thrombocytopenia  - continue hydroxyurea.   Follows with Dr. Bobby [name] at [facility].  CAD - stable, no anginal sx's Chronic diastolic CHF - euvolemic, stable. Monitor volume status.    Patient BMI: Body mass index is 29.76 kg/m.   DVT prophylaxis:  apixaban (ELIQUIS) tablet 5 mg   Diet:  Diet Orders (From admission, onward)    Start     Ordered   03/31/21 0817  Diet Heart Room service appropriate? Yes; Fluid consistency: Thin  Diet effective now       Question Answer Comment  Room service appropriate? Yes   Fluid  consistency: Thin      03/31/21 0817            Code Status: Full Code   Brief Narrative / Hospital Course to Date:   Per H&P by Dr. Neysa [name]: "80 year old female with past medical history of AAA, chronic diastolic CHF, CAD, paroxysmal atrial fibrillation on Eliquis, hypertension, hyperlipidemia, s/p right nephrectomy for chronically infected kidney, nephrolithiasis with recent left ureteroscopy with irrigation of stones and ureteral stent exchange with Dr. Tresa [name] on 6/3 who presented to the ED from home with complaints of weakness, fevers and rigors since the early evening of 6/3.  Temperature as high as 104 F and she has been treating with Tylenol. Urine has been very dark since her procedure.  Denies nausea, vomiting, diarrhea, abdominal pain, back pain, cough or shortness of breath.  No other complaints....     ED Course: Afebrile, hemodynamically stable, on room air. Notable Labs from 6/4: creatinine 2.2 (1.9 on 6/3), normal lactic acid, WBC 15.0, Hb 11.1, UA positive for infection... CT renal stone- left double-J ureteral stent in place although distal pigtail is centered at the left UVJ and does not seem to completely be inside the bladder, moderate left hydro with perinephric and periureteral inflammatory stranding, small residual left nephrolithiasis..."  Admitted to hospitalist service with urology consulted. On Rocephin pending repeat urine cultures.   Subjective 04/02/21    Patient seen with husband at bedside this morning.  She reports feeling well today.  She denies any further fevers or chills.  Also denies nausea,  vomiting, diarrhea or constipation.  No other acute complaints.  Hoping to see Dr. Tresa [name] today.   Disposition Plan & Communication   Status is: Inpatient  Remains inpatient appropriate because:IV treatments appropriate due to intensity of illness or inability to take PO.  IV antibiotics are indicated pending urine culture given complicated UTI.   Dispo: The  patient is from: Home              Anticipated d/c is to: Home              Patient currently is not medically stable to d/c.   Difficult to place patient No   Family Communication: Husband was at bedside on rounds today 6/6   Consults, Procedures, Significant Events   Consultants:   Urology  Procedures:   None  Antimicrobials:  Anti-infectives (From admission, onward)   Start     Dose/Rate Route Frequency Ordered Stop   04/01/21 1000  fluconazole (DIFLUCAN) tablet 200 mg        200 mg Oral Daily 03/31/21 0839 04/03/21 0959   04/01/21 0600  cefTRIAXone (ROCEPHIN) 1 g in sodium chloride 0.9 % 100 mL IVPB        1 g 200 mL/hr over 30 Minutes Intravenous Every 24 hours 03/31/21 1226     03/31/21 1000  fluconazole (DIFLUCAN) tablet 400 mg        400 mg Oral  Once 03/31/21 0837 03/31/21 1026   03/31/21 0500  cefTRIAXone (ROCEPHIN) 2 g in sodium chloride 0.9 % 100 mL IVPB        2 g 200 mL/hr over 30 Minutes Intravenous  Once 03/31/21 0458 03/31/21 0544        Micro    Objective   Vitals:   04/01/21 0525 04/01/21 1316 04/01/21 2051 04/02/21 0424  BP: 139/79 (!) 145/74 125/75 (!) 145/70  Pulse: 79 78 75 79  Resp: 16 18 17 16   Temp: 99 F (37.2 C) 98 F (36.7 C) 99.4 F (37.4 C) 98 F (36.7 C)  TempSrc: Oral Oral Oral Oral  SpO2: 95% 99% 97% 99%  Weight:      Height:        Intake/Output Summary (Last 24 hours) at 04/02/2021 0755 Last data filed at 04/02/2021 0160 Gross per 24 hour  Intake 1130.12 ml  Output 1601 ml  Net -470.88 ml   Filed Weights   03/30/21 2221  Weight: 86.2 kg    Physical Exam:  General exam: awake, alert, no acute distress HEENT: atraumatic, clear conjunctiva, anicteric sclera, moist mucus membranes, hearing grossly normal  Respiratory system: CTAB, no wheezes, rales or rhonchi, normal respiratory effort. Cardiovascular system: normal S1/S2, RRR, no JVD, murmurs, rubs, gallops, no pedal edema.   Gastrointestinal system: soft, NT, ND,  no HSM felt, +bowel sounds. Central nervous system: A&O x4. no gross focal neurologic deficits, normal speech Extremities: moves all, no edema, normal tone Skin: dry, intact, normal temperature, normal color, No rashes, lesions or ulcers Psychiatry: normal mood, congruent affect, judgement and insight appear normal  Labs   Data Reviewed: I have personally reviewed following labs and imaging studies  CBC: Recent Labs  Lab 03/29/21 1005 03/30/21 2315 03/31/21 0941 04/01/21 0422 04/02/21 0421  WBC 11.2* 15.0* 10.5 8.7 8.0  NEUTROABS  --  12.2*  --   --   --   HGB 12.6 11.1* 11.0* 10.6* 10.8*  HCT 41.0 36.3 36.5 34.5* 35.0*  MCV 89.7 90.1 91.0 90.6 89.1  PLT 565* 429*  450* 417* 858*   Basic Metabolic Panel: Recent Labs  Lab 03/29/21 1005 03/30/21 2315 03/31/21 0941 04/01/21 0422 04/02/21 0421  NA 138 136 138 136 135  K 5.8* 4.1 3.9 4.0 3.6  CL 107 106 105 106 104  CO2 20* 21* 24 23 23   GLUCOSE 107* 110* 105* 94 108*  BUN 22 25* 25* 21 20  CREATININE 1.92* 2.20* 2.08* 1.59* 1.62*  CALCIUM 9.0 8.4* 8.4* 8.3* 8.4*   GFR: Estimated Creatinine Clearance: 31.7 mL/min (A) (by C-G formula based on SCr of 1.62 mg/dL (H)). Liver Function Tests: Recent Labs  Lab 03/30/21 2315  AST 16  ALT 11  ALKPHOS 57  BILITOT 0.5  PROT 7.0  ALBUMIN 2.8*   No results for input(s): LIPASE, AMYLASE in the last 168 hours. No results for input(s): AMMONIA in the last 168 hours. Coagulation Profile: Recent Labs  Lab 03/30/21 2315  INR 2.0*   Cardiac Enzymes: No results for input(s): CKTOTAL, CKMB, CKMBINDEX, TROPONINI in the last 168 hours. BNP (last 3 results) No results for input(s): PROBNP in the last 8760 hours. HbA1C: No results for input(s): HGBA1C in the last 72 hours. CBG: No results for input(s): GLUCAP in the last 168 hours. Lipid Profile: No results for input(s): CHOL, HDL, LDLCALC, TRIG, CHOLHDL, LDLDIRECT in the last 72 hours. Thyroid Function Tests: No results for  input(s): TSH, T4TOTAL, FREET4, T3FREE, THYROIDAB in the last 72 hours. Anemia Panel: No results for input(s): VITAMINB12, FOLATE, FERRITIN, TIBC, IRON, RETICCTPCT in the last 72 hours. Sepsis Labs: Recent Labs  Lab 03/30/21 2315  LATICACIDVEN 0.9    Recent Results (from the past 240 hour(s))  Urine Culture     Status: Abnormal   Collection Time: 03/29/21 12:22 PM   Specimen: Urine, Cystoscope  Result Value Ref Range Status   Specimen Description   Final    URINE, RANDOM CYSTO Performed at [place] [address], [place], [place] [address]    Special Requests   Final    NONE Performed at [facility], [place] [address] [place], [place] [address]    Culture >=100,000 COLONIES/mL YEAST (A)  Final   Report Status 03/30/2021 FINAL  Final  Urine culture     Status: Abnormal   Collection Time: 03/30/21 10:15 PM   Specimen: In/Out Cath Urine  Result Value Ref Range Status   Specimen Description   Final    IN/OUT CATH URINE Performed at [place] [address] [place], [place] [address]    Special Requests   Final    NONE Performed at [facility], [place] [address] [place], [place] [address]    Culture 7,000 COLONIES/mL YEAST (A)  Final   Report Status 04/01/2021 FINAL  Final  Blood culture (routine single)     Status: None (Preliminary result)   Collection Time: 03/30/21 11:15 PM   Specimen: BLOOD  Result Value Ref Range Status   Specimen Description   Final    BLOOD LEFT ANTECUBITAL Performed at [place] [address] [place], [place] [address]    Special Requests   Final    BOTTLES DRAWN AEROBIC AND ANAEROBIC Blood Culture adequate volume Performed at [place] [address] [place], [place] [address]    Culture   Final    NO GROWTH 1 DAY Performed at [facility], [place] [address] [place], [place] [address]    Report Status  PENDING  Incomplete  Resp Panel by RT-PCR (Flu A&B, Covid) Nasopharyngeal Swab     Status: None   Collection Time: 03/30/21 11:39 PM   Specimen: Nasopharyngeal Swab; Nasopharyngeal(NP) swabs in vial transport medium  Result Value Ref Range Status   SARS Coronavirus 2 by RT PCR NEGATIVE NEGATIVE Final    Comment: (NOTE) SARS-CoV-2 target nucleic acids are NOT DETECTED.  The SARS-CoV-2 RNA is generally detectable in upper respiratory specimens during the acute phase of infection. The lowest concentration of SARS-CoV-2 viral copies this assay can detect is 138 copies/mL. A negative result does not preclude SARS-Cov-2 infection and should not be used as the sole basis for treatment or other patient management decisions. A negative result may occur with  improper specimen collection/handling, submission of specimen other than nasopharyngeal swab, presence of viral mutation(s) within the areas targeted by this assay, and inadequate number of viral copies(<138 copies/mL). A negative result must be combined with clinical observations, patient history, and epidemiological information. The expected result is Negative.  Fact Sheet for Patients:  EntrepreneurPulse.com.au  Fact Sheet for Healthcare Providers:  IncredibleEmployment.be  This test is no t yet approved or cleared by the [place] FDA and  has been authorized for detection and/or diagnosis of SARS-CoV-2 by FDA under an Emergency Use Authorization (EUA). This EUA will remain  in effect (meaning this test can be used) for the duration of the COVID-19 declaration under Section 564(b)(1) of the Act, 21 U.S.C.section 360bbb-3(b)(1), unless the authorization is terminated  or revoked sooner.       Influenza A by PCR NEGATIVE NEGATIVE Final   Influenza B by PCR NEGATIVE NEGATIVE Final    Comment: (NOTE) The Xpert Xpress SARS-CoV-2/FLU/RSV plus assay is intended as an aid in the diagnosis of  influenza from Nasopharyngeal swab specimens and should not be used as a sole basis for treatment. Nasal washings and aspirates are unacceptable for Xpert Xpress SARS-CoV-2/FLU/RSV testing.  Fact Sheet for Patients: EntrepreneurPulse.com.au  Fact Sheet for Healthcare Providers: IncredibleEmployment.be  This test is not yet approved or cleared by the [place] FDA and has been authorized for detection and/or diagnosis of SARS-CoV-2 by FDA under an Emergency Use Authorization (EUA). This EUA will remain in effect (meaning this test can be used) for the duration of the COVID-19 declaration under Section 564(b)(1) of the Act, 21 U.S.C. section 360bbb-3(b)(1), unless the authorization is terminated or revoked.  Performed at [facility], [place] [address], [place], [place] [address]       Imaging Studies   No results found.   Medications   Scheduled Meds: . amitriptyline  25 mg Oral QHS  . apixaban  5 mg Oral BID  . flecainide  75 mg Oral BID  . fluconazole  200 mg Oral Daily  . hydroxyurea  500 mg Oral Once per day on Mon Wed Fri  . pantoprazole  40 mg Oral Daily  . sodium chloride flush  3 mL Intravenous Q12H   Continuous Infusions: . cefTRIAXone (ROCEPHIN)  IV 1 g (04/02/21 0526)       LOS: 2 days    Time spent: 30 minutes    Ezekiel Slocumb, DO [facility]  04/02/2021, 7:55 AM      If 7PM-7AM, please contact night-coverage. How to contact the [facility] Attending or Consulting provider [place] or covering provider during after hours [place], for this patient?    1. Check the care team in [facility] and look for a) attending/consulting [facility] provider  listed and b) the [facility] team listed 2. Log into [email or web address] and use [place]'s universal password to access. If you do not have the password, please contact the hospital operator. 3. Locate the [facility] provider you are looking for under [facility] and page  to a number that you can be directly reached. 4. If you still have difficulty reaching the provider, please page the [facility] (Director on Call) for the Hospitalists listed on amion for assistance.

## 2021-04-01 NOTE — Hospital Course (Signed)
Per H&P by Dr. Neysa Bonito: "80 year old female with past medical history of AAA, chronic diastolic CHF, CAD, paroxysmal atrial fibrillation on Eliquis, hypertension, hyperlipidemia, s/p right nephrectomy for chronically infected kidney, nephrolithiasis with recent left ureteroscopy with irrigation of stones and ureteral stent exchange with Dr. Tresa Moore on 6/3 who presented to the ED from home with complaints of weakness, fevers and rigors since the early evening of 6/3.  Temperature as high as 104 F and she has been treating with Tylenol. Urine has been very dark since her procedure.  Denies nausea, vomiting, diarrhea, abdominal pain, back pain, cough or shortness of breath.  No other complaints....     ED Course: Afebrile, hemodynamically stable, on room air. Notable Labs from 6/4: creatinine 2.2 (1.9 on 6/3), normal lactic acid, WBC 15.0, Hb 11.1, UA positive for infection... CT renal stone- left double-J ureteral stent in place although distal pigtail is centered at the left UVJ and does not seem to completely be inside the bladder, moderate left hydro with perinephric and periureteral inflammatory stranding, small residual left nephrolithiasis..."  Admitted to hospitalist service with urology consulted. On Rocephin pending repeat urine cultures.

## 2021-04-02 DIAGNOSIS — N12 Tubulo-interstitial nephritis, not specified as acute or chronic: Secondary | ICD-10-CM

## 2021-04-02 LAB — CBC
HCT: 35 % — ABNORMAL LOW (ref 36.0–46.0)
Hemoglobin: 10.8 g/dL — ABNORMAL LOW (ref 12.0–15.0)
MCH: 27.5 pg (ref 26.0–34.0)
MCHC: 30.9 g/dL (ref 30.0–36.0)
MCV: 89.1 fL (ref 80.0–100.0)
Platelets: 450 10*3/uL — ABNORMAL HIGH (ref 150–400)
RBC: 3.93 MIL/uL (ref 3.87–5.11)
RDW: 14.8 % (ref 11.5–15.5)
WBC: 8 10*3/uL (ref 4.0–10.5)
nRBC: 0 % (ref 0.0–0.2)

## 2021-04-02 LAB — BASIC METABOLIC PANEL
Anion gap: 8 (ref 5–15)
BUN: 20 mg/dL (ref 8–23)
CO2: 23 mmol/L (ref 22–32)
Calcium: 8.4 mg/dL — ABNORMAL LOW (ref 8.9–10.3)
Chloride: 104 mmol/L (ref 98–111)
Creatinine, Ser: 1.62 mg/dL — ABNORMAL HIGH (ref 0.44–1.00)
GFR, Estimated: 32 mL/min — ABNORMAL LOW (ref >60)
Glucose, Bld: 108 mg/dL — ABNORMAL HIGH (ref 70–99)
Potassium: 3.6 mmol/L (ref 3.5–5.1)
Sodium: 135 mmol/L (ref 135–145)

## 2021-04-02 MED ORDER — FLUCONAZOLE 200 MG PO TABS: 200.0000 mg | ORAL_TABLET | Freq: Every day | ORAL | 0 refills | Status: AC

## 2021-04-02 NOTE — Discharge Summary (Signed)
Physician Discharge Summary  [name] [name] Else ZTI:[id number] DOB: 31-Aug-1941 DOA: 03/30/2021  PCP: [name], MD  Admit date: 03/30/2021 Discharge date: 04/02/2021  Admitted From: home Disposition:  home  Recommendations for Outpatient Follow-up:  Follow up with PCP in 1-2 weeks Please obtain BMP/CBC in one week Please follow up with Dr. Tresa Moore, urologist as scheduled  Home Health: No  Equipment/Devices: None   Discharge Condition: Stable  CODE STATUS: Full  Diet recommendation: Heart Healthy     Discharge Diagnoses: Principal Problem:   Pyelonephritis of left kidney Active Problems:   Acute renal failure (HCC)   Complicated UTI (urinary tract infection)   Paroxysmal atrial fibrillation (HCC)   Chronic diastolic congestive heart failure (HCC)   Iron deficiency anemia   Anticoagulant long-term use   Hypertension   Coronary artery disease   Hyperlipidemia   GERD (gastroesophageal reflux disease)   Essential thrombocythemia ([place])    Summary of HPI and Hospital Course:  Per H&P by Dr. Neysa Bonito: "80 year old female with past medical history of AAA, chronic diastolic CHF, CAD, paroxysmal atrial fibrillation on Eliquis, hypertension, hyperlipidemia, s/p right nephrectomy for chronically infected kidney, nephrolithiasis with recent left ureteroscopy with irrigation of stones and ureteral stent exchange with Dr. Tresa Moore on 6/3 who presented to the ED from home with complaints of weakness, fevers and rigors since the early evening of 6/3.  Temperature as high as 104 F and she has been treating with Tylenol. Urine has been very dark since her procedure.  Denies nausea, vomiting, diarrhea, abdominal pain, back pain, cough or shortness of breath.  No other complaints....     ED Course: Afebrile, hemodynamically stable, on room air. Notable Labs from 6/4: creatinine 2.2 (1.9 on 6/3), normal lactic acid, WBC 15.0, Hb 11.1, UA positive for infection... CT renal stone- left double-J ureteral  stent in place although distal pigtail is centered at the left UVJ and does not seem to completely be inside the bladder, moderate left hydro with perinephric and periureteral inflammatory stranding, small residual left nephrolithiasis..."  Admitted to hospitalist service with urology consulted. On Rocephin pending repeat urine cultures.     Complicated UTI with fungal pyelonephritis of left kidney s/p left ureteral stent placement - treated empirically with IV Diflucan and empiric Rocephin pending urine culture which again grew yeast, no bacterial growth.   Transitioned to oral Diflucan to complete total 7 days. Patient already has scheduled follow-up Dr. Tresa Moore.   Nephrolithiasis with recent left ureteroscopy with irrigation of stones and ureteral stent exchange with Dr. Tresa Moore 6/3 -  Urology consulted.   Follow up as scheduled.   AKI superimposed on CKD stage IIIa -present on admission with creatinine 2.20, up from 1.9 the day before with baseline creatinine 1.1.  Suspect multifactorial prerenal azotemia and related to obstructive uropathy given above. Improved with IV fluids.   Monitor BMP   GERD -continue PPI   Hypertension -chronic, stable.  Not on antihypertensives.  Monitor BP.   Essential thrombocytopenia  - continue hydroxyurea.   Follows with Dr. Bobby Rumpf at [facility].   CAD - stable, no anginal sx's Chronic diastolic CHF - euvolemic, stable. Monitor volume status.   Discharge Instructions   Discharge Instructions     Call MD for:   Complete by: As directed    Flank pain or worsening abdominal pain, low urine output   Call MD for:  extreme fatigue   Complete by: As directed    Call MD for:  persistant dizziness or light-headedness  Complete by: As directed    Call MD for:  persistant nausea and vomiting   Complete by: As directed    Call MD for:  severe uncontrolled pain   Complete by: As directed    Call MD for:  temperature >100.4   Complete by: As  directed    Diet - low sodium heart healthy   Complete by: As directed    Increase activity slowly   Complete by: As directed       Allergies as of 04/02/2021       Reactions   Codeine Nausea Only, Other (See Comments)   hallucinations   Hydrocodone Nausea Only, Other (See Comments)        Medication List     STOP taking these medications    nitrofurantoin (macrocrystal-monohydrate) 100 MG capsule Commonly known as: Macrobid       TAKE these medications    acetaminophen 500 MG tablet Commonly known as: TYLENOL Take 1,000 mg by mouth every 6 (six) hours as needed for moderate pain or headache.   amitriptyline 25 MG tablet Commonly known as: ELAVIL Take 25 mg by mouth at bedtime.   Eliquis 5 MG Tabs tablet Generic drug: apixaban Take 5 mg by mouth 2 (two) times daily.   flecainide 50 MG tablet Commonly known as: TAMBOCOR Take 1.5 tablets (75 mg total) by mouth 2 (two) times daily.   fluconazole 200 MG tablet Commonly known as: DIFLUCAN Take 1 tablet (200 mg total) by mouth daily for 5 days.   hydroxyurea 500 MG capsule Commonly known as: HYDREA Take 500 mg by mouth every Monday, Wednesday, and Friday. May take with food to minimize GI side effects.   omeprazole 40 MG capsule Commonly known as: PRILOSEC Take 40 mg by mouth daily.   ondansetron 4 MG disintegrating tablet Commonly known as: ZOFRAN-ODT Take 4 mg by mouth every 6 (six) hours as needed for nausea/vomiting.   promethazine 25 MG tablet Commonly known as: PHENERGAN Take 25 mg by mouth every 8 (eight) hours as needed.   traMADol 50 MG tablet Commonly known as: Ultram Take 1-2 tablets (50-100 mg total) by mouth every 6 (six) hours as needed for moderate pain (Post-operatively).   triamcinolone cream 0.1 % Commonly known as: KENALOG Apply 1 application topically 2 (two) times daily as needed (eczema).        Allergies  Allergen Reactions   Codeine Nausea Only and Other (See Comments)     hallucinations   Hydrocodone Nausea Only and Other (See Comments)     If you experience worsening of your admission symptoms, develop shortness of breath, life threatening emergency, suicidal or homicidal thoughts you must seek medical attention immediately by calling 911 or calling your MD immediately  if symptoms less severe.    Please note   You were cared for by a hospitalist during your hospital stay. If you have any questions about your discharge medications or the care you received while you were in the hospital after you are discharged, you can call the unit and asked to speak with the hospitalist on call if the hospitalist that took care of you is not available. Once you are discharged, your primary care physician will handle any further medical issues. Please note that NO REFILLS for any discharge medications will be authorized once you are discharged, as it is imperative that you return to your primary care physician (or establish a relationship with a primary care physician if you do not have one)  for your aftercare needs so that they can reassess your need for medications and monitor your lab values.   Consultations: Urology    Procedures/Studies: [facility] Chest Port 1 View  Result Date: 03/30/2021 CLINICAL DATA:  Sepsis EXAM: PORTABLE CHEST 1 VIEW COMPARISON:  None. FINDINGS: The heart size and mediastinal contours are within normal limits. Both lungs are clear. The visualized skeletal structures are unremarkable. IMPRESSION: No active disease. Electronically Signed   By: Ulyses Jarred M.D.   On: 03/30/2021 23:58   DG C-Arm 1-60 Min-No Report  Result Date: 03/29/2021 Fluoroscopy was utilized by the requesting physician.  No radiographic interpretation.   DG C-Arm 1-60 Min-No Report  Result Date: 03/13/2021 Fluoroscopy was utilized by the requesting physician.  No radiographic interpretation.   CT RENAL STONE STUDY  Result Date: 03/31/2021 CLINICAL DATA:  80 year old female  with weakness and fever. Status post cystoscopy yesterday, urinary tract stone. History of right nephrectomy, uterine cancer. EXAM: CT ABDOMEN AND PELVIS WITHOUT CONTRAST TECHNIQUE: Multidetector CT imaging of the abdomen and pelvis was performed following the standard protocol without IV contrast. COMPARISON:  CT Abdomen and Pelvis 01/08/2021 and earlier. FINDINGS: Lower chest: Tortuous descending thoracic aorta appears stable since March. Borderline to mild cardiomegaly. No pericardial or pleural effusion. Mildly fibrotic bilateral lung base scarring greater on the left. Hepatobiliary: Chronically absent gallbladder. Negative noncontrast liver. Pancreas: Negative. Spleen: Negative. Adrenals/Urinary Tract: Normal adrenal glands. Surgically absent right kidney. Blind-ending right ureter at the pelvic inlet is mildly dilated but unchanged. Left double-J ureteral stent in place with distal pigtail at the ureterovesical junction and not completely inside the bladder on series 2, image 72. Trace gas within the urinary bladder. Left periureteral stranding. And left hydronephrosis, moderate (coronal image 90) with small volume of gas in the left renal collecting system. Associated moderate left perinephric inflammatory stranding. Staghorn calculus seen in March does not persist. There are several punctate and/or indistinct left renal collecting system calcifications now noted. Chronic upper pole simple fluid density cyst again noted. Stomach/Bowel: Increased stool ball in the rectum compared to March. Upstream sigmoid colon is nondilated with diverticulosis, but no active inflammation. Redundant transverse colon and hepatic flexure. No large bowel inflammation. Diminutive or absent appendix. Negative terminal ileum. No dilated small bowel. Decompressed stomach and duodenum. No free air or free fluid. Vascular/Lymphatic: Aortoiliac calcified atherosclerosis. Normal caliber abdominal aorta. Vascular patency is not evaluated  in the absence of IV contrast. Reactive appearing left perinephric retroperitoneal lymph nodes, subcentimeter. No other lymphadenopathy. Reproductive: Stable pessary. Surgically absent uterus. Diminutive or absent ovaries. Other: No pelvic free fluid. Musculoskeletal: Osteopenia. Chronic lower lumbar posterior and interbody fusion. Chronic L1 and L2 compression fractures appear stable. Chronic right hip arthroplasty. No acute or suspicious osseous abnormality identified. IMPRESSION: 1. Left double-J ureteral stent in place although distal pigtail is centered at the left UVJ and does not seem to completely inside the bladder. Associated moderate left hydronephrosis with perinephric and periureteral inflammatory stranding. Small residual left nephrolithiasis. Small volume of gas in the left renal collecting system and urinary bladder. 2. Stable sequelae of right nephrectomy. 3. Increased stool ball in the rectum compared to March, consider mild fecal impaction. 4. Sigmoid diverticulosis without active inflammation. Aortic Atherosclerosis (ICD10-I70.0). Lung base scarring. Osteopenia with chronic L1 and L2 compression fractures. Electronically Signed   By: Genevie Ann M.D.   On: 03/31/2021 04:24       Subjective: Pt reports she feels well.  No F/C, N/V, appetite improved.  No acute  complaints. Eager to go home.   Discharge Exam: Vitals:   04/01/21 2051 04/02/21 0424  BP: 125/75 (!) 145/70  Pulse: 75 79  Resp: 17 16  Temp: 99.4 F (37.4 C) 98 F (36.7 C)  SpO2: 97% 99%   Vitals:   04/01/21 0525 04/01/21 1316 04/01/21 2051 04/02/21 0424  BP: 139/79 (!) 145/74 125/75 (!) 145/70  Pulse: 79 78 75 79  Resp: 16 18 17 16   Temp: 99 F (37.2 C) 98 F (36.7 C) 99.4 F (37.4 C) 98 F (36.7 C)  TempSrc: Oral Oral Oral Oral  SpO2: 95% 99% 97% 99%  Weight:      Height:        General: Pt is alert, awake, not in acute distress Cardiovascular: RRR, S1/S2 +, no rubs, no gallops Respiratory: CTA  bilaterally, no wheezing, no rhonchi Abdominal: Soft, NT, ND, bowel sounds + Extremities: no edema, no cyanosis    The results of significant diagnostics from this hospitalization (including imaging, microbiology, ancillary and laboratory) are listed below for reference.     Microbiology: Recent Results (from the past 240 hour(s))  Urine Culture     Status: Abnormal   Collection Time: 03/29/21 12:22 PM   Specimen: Urine, Cystoscope  Result Value Ref Range Status   Specimen Description   Final    URINE, RANDOM CYSTO Performed at [facility] [address], [place], [place] [address]    Special Requests   Final    NONE Performed at [facility], [place] [address], [place], [place] [address]    Culture >=100,000 COLONIES/mL YEAST (A)  Final   Report Status 03/30/2021 FINAL  Final  Urine culture     Status: Abnormal   Collection Time: 03/30/21 10:15 PM   Specimen: In/Out Cath Urine  Result Value Ref Range Status   Specimen Description   Final    IN/OUT CATH URINE Performed at [facility] [address], [place], [place] [address]    Special Requests   Final    NONE Performed at [facility], [place] [address] [place], [place] [address]    Culture 7,000 COLONIES/mL YEAST (A)  Final   Report Status 04/01/2021 FINAL  Final  Blood culture (routine single)     Status: None (Preliminary result)   Collection Time: 03/30/21 11:15 PM   Specimen: BLOOD  Result Value Ref Range Status   Specimen Description   Final    BLOOD LEFT ANTECUBITAL Performed at [facility] [address], [place], [place] [address]    Special Requests   Final    BOTTLES DRAWN AEROBIC AND ANAEROBIC Blood Culture adequate volume Performed at [facility] [address], [place], [place] [address]    Culture   Final    NO GROWTH 2 DAYS Performed at [facility] [address]  [place], [place] [address]    Report Status PENDING  Incomplete  Resp Panel by RT-PCR (Flu A&B, Covid) Nasopharyngeal Swab     Status: None   Collection Time: 03/30/21 11:39 PM   Specimen: Nasopharyngeal Swab; Nasopharyngeal(NP) swabs in vial transport medium  Result Value Ref Range Status   SARS Coronavirus 2 by RT PCR NEGATIVE NEGATIVE Final    Comment: (NOTE) SARS-CoV-2 target nucleic acids are NOT DETECTED.  The SARS-CoV-2 RNA is generally detectable in upper respiratory specimens during the acute phase of infection. The lowest concentration of SARS-CoV-2 viral copies this assay can  detect is 138 copies/mL. A negative result does not preclude SARS-Cov-2 infection and should not be used as the sole basis for treatment or other patient management decisions. A negative result may occur with  improper specimen collection/handling, submission of specimen other than nasopharyngeal swab, presence of viral mutation(s) within the areas targeted by this assay, and inadequate number of viral copies(<138 copies/mL). A negative result must be combined with clinical observations, patient history, and epidemiological information. The expected result is Negative.  Fact Sheet for Patients:  EntrepreneurPulse.com.au  Fact Sheet for Healthcare Providers:  IncredibleEmployment.be  This test is no t yet approved or cleared by the [place] FDA and  has been authorized for detection and/or diagnosis of SARS-CoV-2 by FDA under an Emergency Use Authorization (EUA). This EUA will remain  in effect (meaning this test can be used) for the duration of the COVID-19 declaration under Section 564(b)(1) of the Act, 21 U.S.C.section 360bbb-3(b)(1), unless the authorization is terminated  or revoked sooner.       Influenza A by PCR NEGATIVE NEGATIVE Final   Influenza B by PCR NEGATIVE NEGATIVE Final    Comment: (NOTE) The Xpert Xpress SARS-CoV-2/FLU/RSV plus assay is  intended as an aid in the diagnosis of influenza from Nasopharyngeal swab specimens and should not be used as a sole basis for treatment. Nasal washings and aspirates are unacceptable for Xpert Xpress SARS-CoV-2/FLU/RSV testing.  Fact Sheet for Patients: EntrepreneurPulse.com.au  Fact Sheet for Healthcare Providers: IncredibleEmployment.be  This test is not yet approved or cleared by the [place] FDA and has been authorized for detection and/or diagnosis of SARS-CoV-2 by FDA under an Emergency Use Authorization (EUA). This EUA will remain in effect (meaning this test can be used) for the duration of the COVID-19 declaration under Section 564(b)(1) of the Act, 21 U.S.C. section 360bbb-3(b)(1), unless the authorization is terminated or revoked.  Performed at [facility], [place] [address] [place], [place] [address]      Labs: BNP (last 3 results) No results for input(s): BNP in the last 8760 hours. Basic Metabolic Panel: Recent Labs  Lab 03/29/21 1005 03/30/21 2315 03/31/21 0941 04/01/21 0422 04/02/21 0421  NA 138 136 138 136 135  K 5.8* 4.1 3.9 4.0 3.6  CL 107 106 105 106 104  CO2 20* 21* 24 23 23   GLUCOSE 107* 110* 105* 94 108*  BUN 22 25* 25* 21 20  CREATININE 1.92* 2.20* 2.08* 1.59* 1.62*  CALCIUM 9.0 8.4* 8.4* 8.3* 8.4*   Liver Function Tests: Recent Labs  Lab 03/30/21 2315  AST 16  ALT 11  ALKPHOS 57  BILITOT 0.5  PROT 7.0  ALBUMIN 2.8*   No results for input(s): LIPASE, AMYLASE in the last 168 hours. No results for input(s): AMMONIA in the last 168 hours. CBC: Recent Labs  Lab 03/29/21 1005 03/30/21 2315 03/31/21 0941 04/01/21 0422 04/02/21 0421  WBC 11.2* 15.0* 10.5 8.7 8.0  NEUTROABS  --  12.2*  --   --   --   HGB 12.6 11.1* 11.0* 10.6* 10.8*  HCT 41.0 36.3 36.5 34.5* 35.0*  MCV 89.7 90.1 91.0 90.6 89.1  PLT 565* 429* 450* 417* 450*   Cardiac Enzymes: No results for input(s):  CKTOTAL, CKMB, CKMBINDEX, TROPONINI in the last 168 hours. BNP: Invalid input(s): POCBNP CBG: No results for input(s): GLUCAP in the last 168 hours. D-Dimer No results for input(s): DDIMER in the last 72 hours. Hgb A1c No results for input(s): HGBA1C in the last 72 hours.  Lipid Profile No results for input(s): CHOL, HDL, LDLCALC, TRIG, CHOLHDL, LDLDIRECT in the last 72 hours. Thyroid function studies No results for input(s): TSH, T4TOTAL, T3FREE, THYROIDAB in the last 72 hours.  Invalid input(s): FREET3 Anemia work up No results for input(s): VITAMINB12, FOLATE, FERRITIN, TIBC, IRON, RETICCTPCT in the last 72 hours. Urinalysis    Component Value Date/Time   COLORURINE BROWN (A) 03/30/2021 2215   APPEARANCEUR TURBID (A) 03/30/2021 2215   LABSPEC 1.025 03/30/2021 2215   PHURINE 5.5 03/30/2021 2215   GLUCOSEU NEGATIVE 03/30/2021 2215   HGBUR MODERATE (A) 03/30/2021 2215   BILIRUBINUR SMALL (A) 03/30/2021 2215   KETONESUR NEGATIVE 03/30/2021 2215   PROTEINUR >300 (A) 03/30/2021 2215   UROBILINOGEN 1.0 06/04/2013 1250   NITRITE POSITIVE (A) 03/30/2021 2215   LEUKOCYTESUR SMALL (A) 03/30/2021 2215   Sepsis Labs Invalid input(s): PROCALCITONIN,  WBC,  LACTICIDVEN Microbiology Recent Results (from the past 240 hour(s))  Urine Culture     Status: Abnormal   Collection Time: 03/29/21 12:22 PM   Specimen: Urine, Cystoscope  Result Value Ref Range Status   Specimen Description   Final    URINE, RANDOM CYSTO Performed at [facility], [place] [address] [place], [place] [address]    Special Requests   Final    NONE Performed at [facility], [place] [address], [place], [place] [address]    Culture >=100,000 COLONIES/mL YEAST (A)  Final   Report Status 03/30/2021 FINAL  Final  Urine culture     Status: Abnormal   Collection Time: 03/30/21 10:15 PM   Specimen: In/Out Cath Urine  Result Value Ref Range Status   Specimen Description   Final     IN/OUT CATH URINE Performed at [facility] [address] [place], [place] [address]    Special Requests   Final    NONE Performed at [facility], [place] [address], [place], [place] [address]    Culture 7,000 COLONIES/mL YEAST (A)  Final   Report Status 04/01/2021 FINAL  Final  Blood culture (routine single)     Status: None (Preliminary result)   Collection Time: 03/30/21 11:15 PM   Specimen: BLOOD  Result Value Ref Range Status   Specimen Description   Final    BLOOD LEFT ANTECUBITAL Performed at [facility] [address], [place], [place] [address]    Special Requests   Final    BOTTLES DRAWN AEROBIC AND ANAEROBIC Blood Culture adequate volume Performed at [facility] [address] [place], [place] [address]    Culture   Final    NO GROWTH 2 DAYS Performed at [facility] [address] [place], [place] [address]    Report Status PENDING  Incomplete  Resp Panel by RT-PCR (Flu A&B, Covid) Nasopharyngeal Swab     Status: None   Collection Time: 03/30/21 11:39 PM   Specimen: Nasopharyngeal Swab; Nasopharyngeal(NP) swabs in vial transport medium  Result Value Ref Range Status   SARS Coronavirus 2 by RT PCR NEGATIVE NEGATIVE Final    Comment: (NOTE) SARS-CoV-2 target nucleic acids are NOT DETECTED.  The SARS-CoV-2 RNA is generally detectable in upper respiratory specimens during the acute phase of infection. The lowest concentration of SARS-CoV-2 viral copies this assay can detect is 138 copies/mL. A negative result does not preclude SARS-Cov-2 infection and should not be used as the sole basis for treatment or other patient management decisions. A negative result may  occur with  improper specimen collection/handling, submission of specimen other than nasopharyngeal swab, presence of viral mutation(s) within the areas targeted by this assay, and inadequate number of viral copies(<138  copies/mL). A negative result must be combined with clinical observations, patient history, and epidemiological information. The expected result is Negative.  Fact Sheet for Patients:  EntrepreneurPulse.com.au  Fact Sheet for Healthcare Providers:  IncredibleEmployment.be  This test is no t yet approved or cleared by the [place] FDA and  has been authorized for detection and/or diagnosis of SARS-CoV-2 by FDA under an Emergency Use Authorization (EUA). This EUA will remain  in effect (meaning this test can be used) for the duration of the COVID-19 declaration under Section 564(b)(1) of the Act, 21 U.S.C.section 360bbb-3(b)(1), unless the authorization is terminated  or revoked sooner.       Influenza A by PCR NEGATIVE NEGATIVE Final   Influenza B by PCR NEGATIVE NEGATIVE Final    Comment: (NOTE) The Xpert Xpress SARS-CoV-2/FLU/RSV plus assay is intended as an aid in the diagnosis of influenza from Nasopharyngeal swab specimens and should not be used as a sole basis for treatment. Nasal washings and aspirates are unacceptable for Xpert Xpress SARS-CoV-2/FLU/RSV testing.  Fact Sheet for Patients: EntrepreneurPulse.com.au  Fact Sheet for Healthcare Providers: IncredibleEmployment.be  This test is not yet approved or cleared by the [place] FDA and has been authorized for detection and/or diagnosis of SARS-CoV-2 by FDA under an Emergency Use Authorization (EUA). This EUA will remain in effect (meaning this test can be used) for the duration of the COVID-19 declaration under Section 564(b)(1) of the Act, 21 U.S.C. section 360bbb-3(b)(1), unless the authorization is terminated or revoked.  Performed at [facility], [place] [address] [place],  [address]      Time coordinating discharge: Over 30 minutes  SIGNED:   Ezekiel Slocumb, DO [facility] 04/02/2021,  9:41 AM   If 7PM-7AM, please contact night-coverage [email or web address]

## 2021-04-02 NOTE — Progress Notes (Signed)
Pt alert and oriented, tolerating diet. D/C instructions given, pt d/cd to home. 

## 2021-04-05 LAB — CULTURE, BLOOD (SINGLE)
Culture: NO GROWTH
Special Requests: ADEQUATE

## 2021-04-08 ENCOUNTER — Other Ambulatory Visit: Payer: Self-pay | Admitting: Hematology and Oncology

## 2021-04-08 DIAGNOSIS — D693 Immune thrombocytopenic purpura: Secondary | ICD-10-CM

## 2021-04-11 DIAGNOSIS — Z09 Encounter for follow-up examination after completed treatment for conditions other than malignant neoplasm: Secondary | ICD-10-CM | POA: Diagnosis not present

## 2021-04-11 DIAGNOSIS — Z0000 Encounter for general adult medical examination without abnormal findings: Secondary | ICD-10-CM | POA: Diagnosis not present

## 2021-04-11 DIAGNOSIS — Z6831 Body mass index (BMI) 31.0-31.9, adult: Secondary | ICD-10-CM | POA: Diagnosis not present

## 2021-04-11 DIAGNOSIS — N200 Calculus of kidney: Secondary | ICD-10-CM | POA: Diagnosis not present

## 2021-04-16 DIAGNOSIS — N3000 Acute cystitis without hematuria: Secondary | ICD-10-CM | POA: Diagnosis not present

## 2021-04-16 DIAGNOSIS — N202 Calculus of kidney with calculus of ureter: Secondary | ICD-10-CM | POA: Diagnosis not present

## 2021-04-16 DIAGNOSIS — Z905 Acquired absence of kidney: Secondary | ICD-10-CM | POA: Diagnosis not present

## 2021-04-19 ENCOUNTER — Other Ambulatory Visit: Payer: Self-pay | Admitting: Cardiology

## 2021-04-19 NOTE — Telephone Encounter (Signed)
80yo Female Dx Atrial fibrillation Last OV with Dr Agustin Cree on 03/14/21 Wt =86.2 Scr = 1.62 on 03/2021

## 2021-05-03 DIAGNOSIS — N200 Calculus of kidney: Secondary | ICD-10-CM | POA: Diagnosis not present

## 2021-05-04 DIAGNOSIS — N200 Calculus of kidney: Secondary | ICD-10-CM | POA: Diagnosis not present

## 2021-05-09 DIAGNOSIS — N819 Female genital prolapse, unspecified: Secondary | ICD-10-CM | POA: Diagnosis not present

## 2021-05-09 DIAGNOSIS — Z4689 Encounter for fitting and adjustment of other specified devices: Secondary | ICD-10-CM | POA: Diagnosis not present

## 2021-05-14 DIAGNOSIS — Z6831 Body mass index (BMI) 31.0-31.9, adult: Secondary | ICD-10-CM | POA: Diagnosis not present

## 2021-05-14 DIAGNOSIS — N390 Urinary tract infection, site not specified: Secondary | ICD-10-CM | POA: Diagnosis not present

## 2021-05-18 NOTE — Progress Notes (Signed)
Electrophysiology Office Note   Date:  05/20/2021   ID:  [name] [name], DOB 05/26/1941, MRN [id number]  PCP:  [name], MD  Cardiologist:  Agustin Cree Primary Electrophysiologist:  Kadelyn Dimascio Meredith Leeds, MD    Chief Complaint: PVC, AF   History of Present Illness: [name] [name] is a 80 y.o. female who is being seen today for the evaluation of PVC, AF at the request of Park Liter, MD. Presenting today for electrophysiology evaluation.  She has a history significant for paroxysmal atrial fibrillation, CKD, hypertension, hyperlipidemia.  She wore a cardiac monitor that showed a 14% PVC burden.  She does note that her heart rate had been in the 30s at times.  Her metoprolol and diltiazem were stopped which showed improvements in her heart rates.  She has had no episodes of atrial fibrillation since being on her flecainide.  She is now unaware of PVCs.  Flecainide was started prior to her cardiac monitor.  Today, she denies symptoms of palpitations, chest pain, shortness of breath, orthopnea, PND, lower extremity edema, claudication, dizziness, presyncope, syncope, bleeding, or neurologic sequela. The patient is tolerating medications without difficulties.    Past Medical History:  Diagnosis Date   Abdominal aortic atherosclerosis ([place]) 05/07/2016   Noted on CT abd/pelvis   Acute renal failure ([place]) 06/04/2013   Acute respiratory failure with hypoxia (HCC) 06/04/2013   Anemia, unspecified 06/08/2013   Anticoagulant long-term use    ELIQUIS   Cancer ([place])    uterine cancer    Chronic anticoagulation 08/04/2017   Chronic diastolic (congestive) heart failure ([place])     pt denies    Chronic diastolic congestive heart failure ([place]) 05/31/2015   Chronic kidney disease 2014   arf on dialysis in icu   Complication of anesthesia    2021- kidney removed at [facility] pt reports did not get enough med to wake her up and she could not talk and then recovered after given more of  wake up med from anesthesia    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 Marvel [facility] in [place])  pt uses pessary   Diarrhea 06/11/2013   Diverticulosis 05/07/2016   Noted on CT abd/pelvis   Dyslipidemia 05/31/2015   E coli bacteremia 06/08/2013   Encounter for monitoring flecainide therapy 07/30/2016   Essential thrombocythemia ([place]) 03/04/2021   Female genital prolapse 04/15/2016   First degree heart block    General weakness    GERD (gastroesophageal reflux disease)    Hematuria    intermittently due to ureter right stent   Hiatal hernia    High risk medication use 04/15/2016   History of arteriovenostomy for renal dialysis [facility])    History of colon polyps    History of gastric polyp    History of kidney infection    12-31-2018  perinephrtic abscess  right side  s/p drains x2 01-10-2019,  05/ 2020 drains removed   History of kidney stones    History of peptic ulcer disease    History of septic shock    2011 (pt unaware) and 06-04-2013  w/ acute renal failure   History of uterine cancer    STAGE I  --  S/P TAH W/ BSO  (NO OTHER TX)   History of ventilator dependency ([place]) 2014   in setting of septic shock   Hydronephrosis 05/07/2016   Hyperlipemia 04/15/2016   Hyperlipidemia  Hypertension    Iron deficiency anemia hematology/ oncology--- dr Bobby Rumpf at [facility] in [place]--- per lov note 08-07-2017  stabilized and felt to be more chronic anemia   01/ 2018  dx iron def. anemia  s/p  IV Iron infusion and taking oral iron supplement   Lactic acidosis 06/04/2013   Lumbar compression fracture ([place])    01-22-2019 per imaging L1  (06-01-2019 per pt currently no pain)   Midline cystocele 04/15/2016   Nonfunctioning kidney 03/23/2020   NSTEMI, initial episode of care [facility]) 06/07/2013   pt denies    Occlusion of ureteral stent ([place])    PAF (paroxysmal atrial fibrillation) ([place]) DX JUNE  2012   CARDIOLOGIST--  DR Vernie Shanks--- [facility])  CHADS2   Palpitations 04/15/2016   Paroxysmal atrial fibrillation ([place]) 08/04/2017   Presence of pessary    Pyelonephritis 06/04/2013   Right wrist fracture    per pt fractured on 03-10-2019,  had closed reduction and wearing a brace   Sepsis ([place]) 05/07/2016   Septic shock(785.52) 06/04/2013   Tendonitis, Achilles 04/15/2016   Ureteral obstruction, right 06/04/2013   Ureteral stricture, right urologist-- dr Tresa Moore   Chronic--- treated with ureteral stent   Urinary tract infection without hematuria    Uses walker    and wheelchair for longer distance   Ventricular ectopy 12/28/2020   Wears glasses    Past Surgical History:  Procedure Laterality Date   APPENDECTOMY     BACK SURGERY     CARDIOVERSION  09/ 2018   dr Agustin Cree   successful ([facility] )   CHOLECYSTECTOMY  2010   COLONOSCOPY W/ POLYPECTOMY  05/2017   CYSTO/  BALLOON DILATION RIGHT URETERAL STRICTURE/ STENT PLACEMENT  06-02-2013   CYSTO/  BILATERAL RETROGRADE PYELOGRAM/ RIGHT URETEROSCOPY AND STENT PLACEMENT  10-04-2005   CYSTO/ LEFT URETEROSCOPIC STONE EXTRACTION  04-03-2006   CYSTOSCOPY W/ RETROGRADES Right 01/03/2019   Procedure: CYSTOSCOPY WITH RETROGRADE PYELOGRAM RIGHT WITH STENT EXCHANGE;  Surgeon: Irine Seal, MD;  Location: [facility];  Service: Urology;  Laterality: Right;   CYSTOSCOPY W/ URETERAL STENT PLACEMENT Right 02/06/2014   Procedure: CYSTOSCOPY WITH RIGHT RETROGRADE PYELOGRAM RIGHT STENT REMOVAL and REPLACEMENT, Right Ureteroscopy;  Surgeon: Ailene Rud, MD;  Location: [facility];  Service: Urology;  Laterality: Right;   CYSTOSCOPY W/ URETERAL STENT PLACEMENT Right 11/26/2016   Procedure: CYSTOSCOPY WITH RETROGRADE PYELOGRAM/URETERAL STENT REPLACEMENT;  Surgeon: Alexis Frock, MD;  Location: [facility];  Service: Urology;  Laterality: Right;   CYSTOSCOPY W/ URETERAL STENT PLACEMENT Right 04/24/2017    Procedure: CYSTOSCOPY WITH RETROGRADE PYELOGRAM/URETERAL STENT REPLACEMENT;  Surgeon: Alexis Frock, MD;  Location: [facility];  Service: Urology;  Laterality: Right;   CYSTOSCOPY W/ URETERAL STENT PLACEMENT Right 10/07/2017   Procedure: CYSTOSCOPY WITH RETROGRADE PYELOGRAM/URETERAL STENT EXCHANGE;  Surgeon: Alexis Frock, MD;  Location: [facility];  Service: Urology;  Laterality: Right;   CYSTOSCOPY W/ URETERAL STENT PLACEMENT Right 03/03/2018   Procedure: CYSTOSCOPY WITH RIGHT RETROGRADE / RIGHT URETERAL STENT EXCHANGE;  Surgeon: Alexis Frock, MD;  Location: [facility];  Service: Urology;  Laterality: Right;   CYSTOSCOPY W/ URETERAL STENT PLACEMENT Right 09/22/2018   Procedure: CYSTOSCOPY WITH RETROGRADE PYELOGRAM/URETERAL STENT PLACEMENT;  Surgeon: Alexis Frock, MD;  Location: [facility];  Service: Urology;  Laterality: Right;  45 MINS   CYSTOSCOPY W/ URETERAL STENT PLACEMENT Right 06/03/2019   Procedure: CYSTOSCOPY WITH RETROGRADE PYELOGRAM/URETERAL STENT REPLACEMENT;  Surgeon: Alexis Frock,  MD;  Location: [place];  Service: Urology;  Laterality: Right;   CYSTOSCOPY W/ URETERAL STENT PLACEMENT Right 11/23/2019   Procedure: CYSTOSCOPY WITH RETROGRADE PYELOGRAM/URETERAL STENT PLACEMENT;  Surgeon: Alexis Frock, MD;  Location: [facility];  Service: Urology;  Laterality: Right;  45 MINS   CYSTOSCOPY WITH LITHOLAPAXY N/A 11/21/2013   Procedure: CYSTOSCOPY WITH LITHOLAPAXY;  Surgeon: Ailene Rud, MD;  Location: [facility];  Service: Urology;  Laterality: N/A;   CYSTOSCOPY WITH RETROGRADE PYELOGRAM, URETEROSCOPY AND STENT PLACEMENT Bilateral 03/15/2014   Procedure: CYSTOSCOPY WITH BILATERAL RETROGRADE PYELOGRAM, RIGHT DIAGNOSTIC URETEROSCOPY AND STENT EXCHANGE;  Surgeon: Alexis Frock, MD;  Location[facility];  Service: Urology;  Laterality: Bilateral;   CYSTOSCOPY WITH RETROGRADE PYELOGRAM,  URETEROSCOPY AND STENT PLACEMENT Left 03/13/2021   Procedure: FIRST STAGE: CYSTOSCOPY WITH RETROGRADE PYELOGRAM, URETEROSCOPY AND STENT PLACEMENT;  Surgeon: Alexis Frock, MD;  Location: [facility];  Service: Urology;  Laterality: Left;  75 MINS   CYSTOSCOPY WITH RETROGRADE PYELOGRAM, URETEROSCOPY AND STENT PLACEMENT Left 03/29/2021   Procedure: SECOND STGAE:CYSTOSCOPY WITH RETROGRADE PYELOGRAM, URETEROSCOPY AND STENT EXCHANGE;  Surgeon: Alexis Frock, MD;  Location[facility];  Service: Urology;  Laterality: Left;   CYSTOSCOPY WITH STENT PLACEMENT Right 05/07/2016   Procedure: CYSTOSCOPY WITH RETROGRADE PYELOGRAM, URETERAL STENT PLACEMENT;  Surgeon: Franchot Gallo, MD;  Location[facility];  Service: Urology;  Laterality: Right;   EYE SURGERY     Bilateral cataract removal   HEMIARTHROPLASTY HIP Right 11/2016   fractured   HOLMIUM LASER APPLICATION Left 2/33/0076   Procedure: HOLMIUM LASER APPLICATION;  Surgeon: Alexis Frock, MD;  Location: [facility];  Service: Urology;  Laterality: Left;   IR CATHETER TUBE CHANGE  02/04/2019   IR CATHETER TUBE CHANGE  02/04/2019   IR CATHETER TUBE CHANGE  02/11/2019   IR RADIOLOGIST EVAL & MGMT  03/09/2019   JOINT REPLACEMENT     LUMBAR FUSION  JULY 2013   NEPHROLITHOTOMY  X2 YRS AGO   PERCUTANEOUS NEPHROSTOMY  12/2018   ROBOT ASSISTED LAPAROSCOPIC NEPHRECTOMY Right 03/23/2020   Procedure: XI ROBOTIC ASSISTED LAPAROSCOPIC NEPHRECTOMY;  Surgeon: Alexis Frock, MD;  Location[facility];  Service: Urology;  Laterality: Right;   TOTAL ABDOMINAL HYSTERECTOMY W/ BILATERAL SALPINGOOPHORECTOMY  1989   TOTAL KNEE ARTHROPLASTY Bilateral 1992  &  1996   TRANSTHORACIC ECHOCARDIOGRAM  07-24-2017   dr Agustin Cree   ef 60-65% (improved from last echo 2014, was 45-50%)/  trace TR/  mild AV sclerosis without stenosis   URETEROSCOPY Right 11/21/2013   Procedure: URETEROSCOPY WITH STENT REMOVAL AND REPLACEMENT;  Surgeon: Ailene Rud, MD;  Location: [facility];   Service: Urology;  Laterality: Right;     Current Outpatient Medications  Medication Sig Dispense Refill   acetaminophen (TYLENOL) 500 MG tablet Take 1,000 mg by mouth every 6 (six) hours as needed for moderate pain or headache.      amitriptyline (ELAVIL) 25 MG tablet Take 25 mg by mouth at bedtime.     ELIQUIS 5 MG TABS tablet TAKE ONE TABLET BY MOUTH TWICE DAILY 180 tablet 1   flecainide (TAMBOCOR) 100 MG tablet Take 1 tablet (100 mg total) by mouth 2 (two) times daily. 60 tablet 3   hydroxyurea (HYDREA) 500 MG capsule TAKE ONE CAPSULE BY MOUTH every monday, WEDNESDAY & FRIDAY for elevated platelets 36 capsule 2   omeprazole (PRILOSEC) 40 MG capsule Take 40 mg by mouth daily.      ondansetron (ZOFRAN-ODT) 4 MG disintegrating  tablet Take 4 mg by mouth every 6 (six) hours as needed for nausea/vomiting.     promethazine (PHENERGAN) 25 MG tablet Take 25 mg by mouth every 8 (eight) hours as needed.     traMADol (ULTRAM) 50 MG tablet Take 1-2 tablets (50-100 mg total) by mouth every 6 (six) hours as needed for moderate pain (Post-operatively). 20 tablet 0   triamcinolone cream (KENALOG) 0.1 % Apply 1 application topically 2 (two) times daily as needed (eczema).     No current facility-administered medications for this visit.    Allergies:   Codeine and Hydrocodone   Social History:  The patient  reports that she has never smoked. She has never used smokeless tobacco. She reports that she does not drink alcohol and does not use drugs.   Family History:  The patient's family history includes Cancer in her mother; Heart disease in her father and mother; Heart failure in her father and mother.    ROS:  Please see the history of present illness.   Otherwise, review of systems is positive for none.   All other systems are reviewed and negative.    PHYSICAL EXAM: VS:  BP 132/70   Pulse 86   Ht 5\' 7"  (1.702 m)   Wt 187 lb 12.8 oz (85.2 kg)   SpO2 98%   BMI 29.41 kg/m  , BMI Body mass index  is 29.41 kg/m. GEN: Well nourished, well developed, in no acute distress  HEENT: normal  Neck: no JVD, carotid bruits, or masses Cardiac: RRR; no murmurs, rubs, or gallops,no edema  Respiratory:  clear to auscultation bilaterally, normal work of breathing GI: soft, nontender, nondistended, + BS MS: no deformity or atrophy  Skin: warm and dry Neuro:  Strength and sensation are intact Psych: euthymic mood, full affect  EKG:  EKG is ordered today. Personal review of the ekg ordered shows sinus rhythm  Recent Labs: 12/28/2020: Magnesium 1.9 03/30/2021: ALT 11 04/02/2021: BUN 20; Creatinine, Ser 1.62; Hemoglobin 10.8; Platelets 450; Potassium 3.6; Sodium 135    Lipid Panel  No results found for: CHOL, TRIG, HDL, CHOLHDL, VLDL, LDLCALC, LDLDIRECT   Wt Readings from Last 3 Encounters:  05/20/21 187 lb 12.8 oz (85.2 kg)  03/30/21 190 lb (86.2 kg)  03/29/21 193 lb (87.5 kg)      Other studies Reviewed: Additional studies/ records that were reviewed today include: TTE 02/01/20  Review of the above records today demonstrates:   1. Left ventricular ejection fraction, by estimation, is 60 to 65%. The  left ventricle has normal function. The left ventricle has no regional  wall motion abnormalities. Left ventricular diastolic parameters are  consistent with Grade I diastolic  dysfunction (impaired relaxation).   2. Right ventricular systolic function is normal. The right ventricular  size is normal.   3. The mitral valve is normal in structure. No evidence of mitral valve  regurgitation. No evidence of mitral stenosis.   4. The aortic valve is normal in structure. Aortic valve regurgitation is  not visualized. No aortic stenosis is present.   5. There is mild dilatation of the ascending aorta measuring 38 mm.   6. The inferior vena cava is normal in size with greater than 50%  respiratory variability, suggesting right atrial pressure of 3 mmHg.   Cardiac monitor 12/04/2020 personally  reviewed Minimum heart rate: 53 BPM.  Average heart rate: 70 BPM.  Maximal heart rate 108 BPM. Atrial arrhythmia: Rare brief atrial runs.  Occasional PACs Ventricular arrhythmia: Frequent  PVCs ] 14.3%] Conduction abnormality: None significant Symptoms: Skipped beat and fluttering sensation  ASSESSMENT AND PLAN:  1.  Paroxysmal atrial fibrillation: Currently on flecainide, Eliquis.  CHA2DS2-VASc of 5.  High risk medication monitoring.  No further episodes.  Continue current management.  2.  PVCs: Elevated burden of 14.3% on cardiac monitor.  We [name] [name] increase her flecainide to 100 mg.  We [name] [name] also get an echo to ensure that her ejection fraction is not reduced.  If her ejection fraction has remained stable, we [name] [name] continue with current management.  3.  Hypertension: Currently well controlled  Case discussed with primary cardiology  Current medicines are reviewed at length with the patient today.   The patient does not have concerns regarding her medicines.  The following changes were made today: Increase flecainide  Labs/ tests ordered today include:  Orders Placed This Encounter  Procedures   EKG 12-Lead   ECHOCARDIOGRAM COMPLETE BUBBLE STUDY     Disposition:   FU with [name] [name] 6 months  Signed, Ion Gonnella Meredith Leeds, MD  05/20/2021 11:31 AM     [facility] [address] [place] [address] [phone] (office) [phone] (fax)

## 2021-05-20 ENCOUNTER — Encounter: Payer: Self-pay | Admitting: Cardiology

## 2021-05-20 ENCOUNTER — Ambulatory Visit (INDEPENDENT_AMBULATORY_CARE_PROVIDER_SITE_OTHER): Payer: Medicare Other | Admitting: Cardiology

## 2021-05-20 ENCOUNTER — Other Ambulatory Visit: Payer: Self-pay

## 2021-05-20 VITALS — BP 132/70 | HR 86 | Ht 67.0 in | Wt 187.8 lb

## 2021-05-20 DIAGNOSIS — I480 Paroxysmal atrial fibrillation: Secondary | ICD-10-CM

## 2021-05-20 DIAGNOSIS — I493 Ventricular premature depolarization: Secondary | ICD-10-CM | POA: Diagnosis not present

## 2021-05-20 MED ORDER — FLECAINIDE ACETATE 100 MG PO TABS: 100.0000 mg | ORAL_TABLET | Freq: Two times a day (BID) | ORAL | 3 refills | Status: DC

## 2021-05-20 NOTE — Patient Instructions (Signed)
Medication Instructions:  Your physician has recommended you make the following change in your medication: INCREASE Flecainide to 100 mg twice daily  *If you need a refill on your cardiac medications before your next appointment, please call your pharmacy*   Lab Work: None ordered   Testing/Procedures: Your physician has requested that you have an echocardiogram. Echocardiography is a painless test that uses sound waves to create images of your heart. It provides your doctor with information about the size and shape of your heart and how well your heart's chambers and valves are working. This procedure takes approximately one hour. There are no restrictions for this procedure.    Follow-Up: At [facility], you and your health needs are our priority.  As part of our continuing mission to provide you with exceptional heart care, we have created designated Provider Care Teams.  These Care Teams include your primary Cardiologist (physician) and Advanced Practice Providers (APPs -  Physician Assistants and Nurse Practitioners) who all work together to provide you with the care you need, when you need it.  Your next appointment:   6 month(s)  The format for your next appointment:   In Person  Provider:   Allegra Lai, MD    Thank you for choosing [facility]!!   Trinidad Curet, RN [phone]

## 2021-05-24 ENCOUNTER — Other Ambulatory Visit: Payer: Self-pay

## 2021-05-24 ENCOUNTER — Ambulatory Visit (INDEPENDENT_AMBULATORY_CARE_PROVIDER_SITE_OTHER): Payer: Medicare Other

## 2021-05-24 DIAGNOSIS — I493 Ventricular premature depolarization: Secondary | ICD-10-CM

## 2021-05-24 DIAGNOSIS — I480 Paroxysmal atrial fibrillation: Secondary | ICD-10-CM | POA: Diagnosis not present

## 2021-05-24 NOTE — Progress Notes (Addendum)
Complete echocardiogram with bubble study performed.  Jimmy Anandi Abramo RDCS, RVT

## 2021-05-26 LAB — ECHOCARDIOGRAM COMPLETE BUBBLE STUDY
Area-P 1/2: 5.66 cm2
S' Lateral: 1.9 cm

## 2021-06-10 DIAGNOSIS — N3000 Acute cystitis without hematuria: Secondary | ICD-10-CM | POA: Diagnosis not present

## 2021-06-14 ENCOUNTER — Encounter: Payer: Self-pay | Admitting: Cardiology

## 2021-06-14 ENCOUNTER — Ambulatory Visit (INDEPENDENT_AMBULATORY_CARE_PROVIDER_SITE_OTHER): Payer: Medicare Other | Admitting: Cardiology

## 2021-06-14 ENCOUNTER — Other Ambulatory Visit: Payer: Self-pay

## 2021-06-14 VITALS — BP 148/80 | HR 87 | Ht 67.0 in | Wt 188.2 lb

## 2021-06-14 DIAGNOSIS — N390 Urinary tract infection, site not specified: Secondary | ICD-10-CM | POA: Diagnosis not present

## 2021-06-14 DIAGNOSIS — I2510 Atherosclerotic heart disease of native coronary artery without angina pectoris: Secondary | ICD-10-CM

## 2021-06-14 DIAGNOSIS — I5032 Chronic diastolic (congestive) heart failure: Secondary | ICD-10-CM

## 2021-06-14 DIAGNOSIS — I480 Paroxysmal atrial fibrillation: Secondary | ICD-10-CM

## 2021-06-14 NOTE — Progress Notes (Signed)
Cardiology Office Note:    Date:  06/14/2021   ID:  [name] [name], DOB 1941-02-17, MRN [id number]  PCP:  [name], MD  Cardiologist:  Jenne Campus, MD    Referring MD: [name], MD   No chief complaint on file. I am doing fine  History of Present Illness:    [name] [name] is a 80 y.o. female  with past medical history significant for paroxysmal atrial fibrillation being successfully suppressed with flecainide seems to maintain sinus rhythm, she is anticoagulated, recently she was discovered to have frequent ventricular ectopy we will try to manage this with increasing dosages of beta-blocker but that can have make her feel worse therefore the dose was decreased.  Eventually she was seen by our EP team and they recommended increased dose of flecainide to 100 mg twice daily suggest that since that time palpitations seems to be improving.  She also had echocardiogram done which I reviewed with her today which is normal.  Overall she is doing well denies having chest pain tightness squeezing pressure mid chest no shortness of breath palpitations are much better.  Past Medical History:  Diagnosis Date   Abdominal aortic atherosclerosis ([place]) 05/07/2016   Noted on CT abd/pelvis   Acute renal failure ([place]) 06/04/2013   Acute respiratory failure with hypoxia (HCC) 06/04/2013   Anemia, unspecified 06/08/2013   Anticoagulant long-term use    ELIQUIS   Cancer ([place])    uterine cancer    Chronic anticoagulation 08/04/2017   Chronic diastolic (congestive) heart failure ([place])     pt denies    Chronic diastolic congestive heart failure ([place]) 05/31/2015   Chronic kidney disease 2014   arf on dialysis in icu   Complication of anesthesia    2021- kidney removed at [facility] pt reports did not get enough med to wake her up and she could not talk and then recovered after given more of wake up med from anesthesia    Congenital vaginal enterocele 04/15/2016   Coronary artery disease     Cystocele, midline    followed by dr c. [facility] in [place])  pt uses pessary   Diarrhea 06/11/2013   Diverticulosis 05/07/2016   Noted on CT abd/pelvis   Dyslipidemia 05/31/2015   E coli bacteremia 06/08/2013   Encounter for monitoring flecainide therapy 07/30/2016   Essential thrombocythemia ([place]) 03/04/2021   Female genital prolapse 04/15/2016   First degree heart block    General weakness    GERD (gastroesophageal reflux disease)    Hematuria    intermittently due to ureter right stent   Hiatal hernia    High risk medication use 04/15/2016   History of arteriovenostomy for renal dialysis [facility])    History of colon polyps    History of gastric polyp    History of kidney infection    12-31-2018  perinephrtic abscess  right side  s/p drains x2 01-10-2019,  05/ 2020 drains removed   History of kidney stones    History of peptic ulcer disease    History of septic shock    2011 (pt unaware) and 06-04-2013  w/ acute renal failure   History of uterine cancer    STAGE I  --  S/P TAH W/ BSO  (NO OTHER TX)   History of ventilator dependency ([place]) 2014   in setting of septic shock   Hydronephrosis 05/07/2016   Hyperlipemia 04/15/2016   Hyperlipidemia    Hypertension  Iron deficiency anemia hematology/ oncology--- dr Bobby Rumpf at [facility] in [place]--- per lov note 08-07-2017  stabilized and felt to be more chronic anemia   01/ 2018  dx iron def. anemia  s/p  IV Iron infusion and taking oral iron supplement   Lactic acidosis 06/04/2013   Lumbar compression fracture ([place])    01-22-2019 per imaging L1  (06-01-2019 per pt currently no pain)   Midline cystocele 04/15/2016   Nonfunctioning kidney 03/23/2020   NSTEMI, initial episode of care [facility]) 06/07/2013   pt denies    Occlusion of ureteral stent ([place])    PAF (paroxysmal atrial fibrillation) ([place]) DX JUNE 2012   CARDIOLOGIST--  DR Vernie Shanks--- [facility])  CHADS2    Palpitations 04/15/2016   Paroxysmal atrial fibrillation ([place]) 08/04/2017   Presence of pessary    Pyelonephritis 06/04/2013   Right wrist fracture    per pt fractured on 03-10-2019,  had closed reduction and wearing a brace   Sepsis ([place]) 05/07/2016   Septic shock(785.52) 06/04/2013   Tendonitis, Achilles 04/15/2016   Ureteral obstruction, right 06/04/2013   Ureteral stricture, right urologist-- dr Tresa Moore   Chronic--- treated with ureteral stent   Urinary tract infection without hematuria    Uses walker    and wheelchair for longer distance   Ventricular ectopy 12/28/2020   Wears glasses     Past Surgical History:  Procedure Laterality Date   APPENDECTOMY     BACK SURGERY     CARDIOVERSION  09/ 2018   dr Agustin Cree   successful ([facility] )   CHOLECYSTECTOMY  2010   COLONOSCOPY W/ POLYPECTOMY  05/2017   CYSTO/  BALLOON DILATION RIGHT URETERAL STRICTURE/ STENT PLACEMENT  06-02-2013   CYSTO/  BILATERAL RETROGRADE PYELOGRAM/ RIGHT URETEROSCOPY AND STENT PLACEMENT  10-04-2005   CYSTO/ LEFT URETEROSCOPIC STONE EXTRACTION  04-03-2006   CYSTOSCOPY W/ RETROGRADES Right 01/03/2019   Procedure: CYSTOSCOPY WITH RETROGRADE PYELOGRAM RIGHT WITH STENT EXCHANGE;  Surgeon: Irine Seal, MD;  Location: [facility];  Service: Urology;  Laterality: Right;   CYSTOSCOPY W/ URETERAL STENT PLACEMENT Right 02/06/2014   Procedure: CYSTOSCOPY WITH RIGHT RETROGRADE PYELOGRAM RIGHT STENT REMOVAL and REPLACEMENT, Right Ureteroscopy;  Surgeon: Ailene Rud, MD;  Location: [facility];  Service: Urology;  Laterality: Right;   CYSTOSCOPY W/ URETERAL STENT PLACEMENT Right 11/26/2016   Procedure: CYSTOSCOPY WITH RETROGRADE PYELOGRAM/URETERAL STENT REPLACEMENT;  Surgeon: Alexis Frock, MD;  Location: [facility];  Service: Urology;  Laterality: Right;   CYSTOSCOPY W/ URETERAL STENT PLACEMENT Right 04/24/2017   Procedure: CYSTOSCOPY WITH RETROGRADE PYELOGRAM/URETERAL STENT REPLACEMENT;  Surgeon: Alexis Frock, MD;  Location: [facility];  Service: Urology;  Laterality: Right;   CYSTOSCOPY W/ URETERAL STENT PLACEMENT Right 10/07/2017   Procedure: CYSTOSCOPY WITH RETROGRADE PYELOGRAM/URETERAL STENT EXCHANGE;  Surgeon: Alexis Frock, MD;  Location[facility];  Service: Urology;  Laterality: Right;   CYSTOSCOPY W/ URETERAL STENT PLACEMENT Right 03/03/2018   Procedure: CYSTOSCOPY WITH RIGHT RETROGRADE / RIGHT URETERAL STENT EXCHANGE;  Surgeon: Alexis Frock, MD;  Location: [facility];  Service: Urology;  Laterality: Right;   CYSTOSCOPY W/ URETERAL STENT PLACEMENT Right 09/22/2018   Procedure: CYSTOSCOPY WITH RETROGRADE PYELOGRAM/URETERAL STENT PLACEMENT;  Surgeon: Alexis Frock, MD;  Location: [facility];  Service: Urology;  Laterality: Right;  45 MINS   CYSTOSCOPY W/ URETERAL STENT PLACEMENT Right 06/03/2019   Procedure: CYSTOSCOPY WITH RETROGRADE PYELOGRAM/URETERAL STENT REPLACEMENT;  Surgeon: Alexis Frock, MD;  Location:  [place];  Service: Urology;  Laterality: Right;   CYSTOSCOPY W/ URETERAL STENT PLACEMENT Right 11/23/2019   Procedure: CYSTOSCOPY WITH RETROGRADE PYELOGRAM/URETERAL STENT PLACEMENT;  Surgeon: Alexis Frock, MD;  Location: [facility];  Service: Urology;  Laterality: Right;  45 MINS   CYSTOSCOPY WITH LITHOLAPAXY N/A 11/21/2013   Procedure: CYSTOSCOPY WITH LITHOLAPAXY;  Surgeon: Ailene Rud, MD;  Location: [facility];  Service: Urology;  Laterality: N/A;   CYSTOSCOPY WITH RETROGRADE PYELOGRAM, URETEROSCOPY AND STENT PLACEMENT Bilateral 03/15/2014   Procedure: CYSTOSCOPY WITH BILATERAL RETROGRADE PYELOGRAM, RIGHT DIAGNOSTIC URETEROSCOPY AND STENT EXCHANGE;  Surgeon: Alexis Frock, MD;  [facility];  Service: Urology;  Laterality: Bilateral;   CYSTOSCOPY WITH RETROGRADE PYELOGRAM, URETEROSCOPY AND STENT PLACEMENT Left 03/13/2021   Procedure: FIRST STAGE: CYSTOSCOPY WITH  RETROGRADE PYELOGRAM, URETEROSCOPY AND STENT PLACEMENT;  Surgeon: Alexis Frock, MD;  Location: [facility];  Service: Urology;  Laterality: Left;  75 MINS   CYSTOSCOPY WITH RETROGRADE PYELOGRAM, URETEROSCOPY AND STENT PLACEMENT Left 03/29/2021   Procedure: SECOND STGAE:CYSTOSCOPY WITH RETROGRADE PYELOGRAM, URETEROSCOPY AND STENT EXCHANGE;  Surgeon: Alexis Frock, MD;  Location[facility];  Service: Urology;  Laterality: Left;   CYSTOSCOPY WITH STENT PLACEMENT Right 05/07/2016   Procedure: CYSTOSCOPY WITH RETROGRADE PYELOGRAM, URETERAL STENT PLACEMENT;  Surgeon: Franchot Gallo, MD;  Location[facility];  Service: Urology;  Laterality: Right;   EYE SURGERY     Bilateral cataract removal   HEMIARTHROPLASTY HIP Right 11/2016   fractured   HOLMIUM LASER APPLICATION Left 3/81/8299   Procedure: HOLMIUM LASER APPLICATION;  Surgeon: Alexis Frock, MD;  [facility];  Service: Urology;  Laterality: Left;   IR CATHETER TUBE CHANGE  02/04/2019   IR CATHETER TUBE CHANGE  02/04/2019   IR CATHETER TUBE CHANGE  02/11/2019   IR RADIOLOGIST EVAL & MGMT  03/09/2019   JOINT REPLACEMENT     LUMBAR FUSION  JULY 2013   NEPHROLITHOTOMY  X2 YRS AGO   PERCUTANEOUS NEPHROSTOMY  12/2018   ROBOT ASSISTED LAPAROSCOPIC NEPHRECTOMY Right 03/23/2020   Procedure: XI ROBOTIC ASSISTED LAPAROSCOPIC NEPHRECTOMY;  Surgeon: Alexis Frock, MD;  Location[facility];  Service: Urology;  Laterality: Right;   TOTAL ABDOMINAL HYSTERECTOMY W/ BILATERAL SALPINGOOPHORECTOMY  1989   TOTAL KNEE ARTHROPLASTY Bilateral 1992  &  1996   TRANSTHORACIC ECHOCARDIOGRAM  07-24-2017   dr Agustin Cree   ef 60-65% (improved from last echo 2014, was 45-50%)/  trace TR/  mild AV sclerosis without stenosis   URETEROSCOPY Right 11/21/2013   Procedure: URETEROSCOPY WITH STENT REMOVAL AND REPLACEMENT;  Surgeon: Ailene Rud, MD;  Location: [facility];  Service: Urology;  Laterality: Right;    Current Medications: Current Meds  Medication  Sig   acetaminophen (TYLENOL) 500 MG tablet Take 1,000 mg by mouth every 6 (six) hours as needed for moderate pain or headache.    amitriptyline (ELAVIL) 25 MG tablet Take 25 mg by mouth at bedtime.   ELIQUIS 5 MG TABS tablet TAKE ONE TABLET BY MOUTH TWICE DAILY   flecainide (TAMBOCOR) 100 MG tablet Take 1 tablet (100 mg total) by mouth 2 (two) times daily.   hydroxyurea (HYDREA) 500 MG capsule TAKE ONE CAPSULE BY MOUTH every monday, WEDNESDAY & FRIDAY for elevated platelets   omeprazole (PRILOSEC) 40 MG capsule Take 40 mg by mouth daily.    ondansetron (ZOFRAN-ODT) 4 MG disintegrating tablet Take 4 mg by mouth every 6 (six) hours as needed for nausea/vomiting.   promethazine (PHENERGAN) 25 MG  tablet Take 25 mg by mouth every 8 (eight) hours as needed.   traMADol (ULTRAM) 50 MG tablet Take 1-2 tablets (50-100 mg total) by mouth every 6 (six) hours as needed for moderate pain (Post-operatively).   triamcinolone cream (KENALOG) 0.1 % Apply 1 application topically 2 (two) times daily as needed (eczema).     Allergies:   Codeine and Hydrocodone   Social History   Socioeconomic History   Marital status: Married    Spouse name: Not on file   Number of children: Not on file   Years of education: Not on file   Highest education level: Not on file  Occupational History   Occupation: Retired from [employer]  Tobacco Use   Smoking status: Never   Smokeless tobacco: Never  Vaping Use   Vaping Use: Never used  Substance and Sexual Activity   Alcohol use: No   Drug use: No   Sexual activity: Not on file  Other Topics Concern   Not on file  Social History Narrative   Lives in [place] with husband. Drives, walks some, but not very active at home.    Social Determinants of Health   Financial Resource Strain: Not on file  Food Insecurity: Not on file  Transportation Needs: Not on file  Physical Activity: Not on file  Stress: Not on file  Social Connections: Not on file     Family  History: The patient's family history includes Cancer in her mother; Heart disease in her father and mother; Heart failure in her father and mother. ROS:   Please see the history of present illness.    All 14 point review of systems negative except as described per history of present illness  EKGs/Labs/Other Studies Reviewed:      Recent Labs: 12/28/2020: Magnesium 1.9 03/30/2021: ALT 11 04/02/2021: BUN 20; Creatinine, Ser 1.62; Hemoglobin 10.8; Platelets 450; Potassium 3.6; Sodium 135  Recent Lipid Panel No results found for: CHOL, TRIG, HDL, CHOLHDL, VLDL, LDLCALC, LDLDIRECT  Physical Exam:    VS:  BP (!) 148/80 (BP Location: Right Arm, Patient Position: Sitting, Cuff Size: Normal)   Pulse 87   Ht 5\' 7"  (1.702 m)   Wt 188 lb 3.2 oz (85.4 kg)   SpO2 96%   BMI 29.48 kg/m     Wt Readings from Last 3 Encounters:  06/14/21 188 lb 3.2 oz (85.4 kg)  05/20/21 187 lb 12.8 oz (85.2 kg)  03/30/21 190 lb (86.2 kg)     GEN:  Well nourished, well developed in no acute distress HEENT: Normal NECK: No JVD; No carotid bruits LYMPHATICS: No lymphadenopathy CARDIAC: RRR, no murmurs, no rubs, no gallops RESPIRATORY:  Clear to auscultation without rales, wheezing or rhonchi  ABDOMEN: Soft, non-tender, non-distended MUSCULOSKELETAL:  No edema; No deformity  SKIN: Warm and dry LOWER EXTREMITIES: no swelling NEUROLOGIC:  Alert and oriented x 3 PSYCHIATRIC:  Normal affect   ASSESSMENT:    1. Paroxysmal atrial fibrillation (HCC)   2. PAF (paroxysmal atrial fibrillation) ([place])   3. Coronary artery disease involving native coronary artery of native heart without angina pectoris   4. Chronic diastolic congestive heart failure ([place])   5. Urinary tract infection without hematuria, site unspecified    PLAN:    In order of problems listed above:  Paroxysmal atrial fibrillation.  She is anticoagulant check continue, she is suppressed with flecainide we will continue those medications.  His  follow-up also by EP team I did review their notes. Coronary disease stable there  is no chest pain tightness squeezing pressure burning chest. Chronic diastolic congestive heart failure.  Compensated.   Medication Adjustments/Labs and Tests Ordered: Current medicines are reviewed at length with the patient today.  Concerns regarding medicines are outlined above.  Orders Placed This Encounter  Procedures   EKG 12-Lead   Medication changes: No orders of the defined types were placed in this encounter.   Signed, Park Liter, MD, [facility] 06/14/2021 2:17 PM    [place]

## 2021-06-14 NOTE — Patient Instructions (Signed)

## 2021-07-22 DIAGNOSIS — Z905 Acquired absence of kidney: Secondary | ICD-10-CM | POA: Diagnosis not present

## 2021-07-22 DIAGNOSIS — N3000 Acute cystitis without hematuria: Secondary | ICD-10-CM | POA: Diagnosis not present

## 2021-07-22 DIAGNOSIS — N202 Calculus of kidney with calculus of ureter: Secondary | ICD-10-CM | POA: Diagnosis not present

## 2021-08-05 DIAGNOSIS — L98499 Non-pressure chronic ulcer of skin of other sites with unspecified severity: Secondary | ICD-10-CM | POA: Diagnosis not present

## 2021-08-05 DIAGNOSIS — Z6831 Body mass index (BMI) 31.0-31.9, adult: Secondary | ICD-10-CM | POA: Diagnosis not present

## 2021-08-12 DIAGNOSIS — Z23 Encounter for immunization: Secondary | ICD-10-CM | POA: Diagnosis not present

## 2021-08-20 DIAGNOSIS — Z23 Encounter for immunization: Secondary | ICD-10-CM | POA: Diagnosis not present

## 2021-08-27 NOTE — Progress Notes (Addendum)
[place]  [address] [place],  [place]  [address] [phone]  Clinic Day:  09/04/2021  Referring physician: [name], MD  This document serves as a record of services personally performed by Dequincy Macarthur Critchley, MD. It was created on their behalf by [facility], a trained medical scribe. The creation of this record is based on the scribe's personal observations and the provider's statements to them.  HISTORY OF PRESENT ILLNESS:  The patient is a 80 y.o. female with essential thrombocythemia, which is based upon her having an elevated platelet count and testing positive for the JAK2 mutation.  She also has a history of iron deficiency anemia.  She takes hydroxyurea 500 mg every Monday, Wednesday and Friday to keep her platelet count under 400-600.  She comes in today to reassess her peripheral counts.  Since her last visit, the patient has been doing fairly well.  She denies having any side effects from her hydroxyurea.  She also denies having any clotting complications from her underlying essential thrombocythemia.  However, she mentions having hematuria virtually every time she urinates.    PHYSICAL EXAM:  Blood pressure (!) 156/85, pulse 84, temperature 98.6 F (37 C), resp. rate 14, height 5\' 7"  (1.702 m), weight 188 lb (85.3 kg), SpO2 97 %. Wt Readings from Last 3 Encounters:  09/04/21 188 lb (85.3 kg)  06/14/21 188 lb 3.2 oz (85.4 kg)  05/20/21 187 lb 12.8 oz (85.2 kg)   Body mass index is 29.44 kg/m. Performance status (ECOG): 1 Physical Exam Constitutional:      Appearance: Normal appearance. She is not ill-appearing.  HENT:     Mouth/Throat:     Mouth: Mucous membranes are moist.     Pharynx: Oropharynx is clear. No oropharyngeal exudate or posterior oropharyngeal erythema.  Cardiovascular:     Rate and Rhythm: Normal rate and regular rhythm.     Heart sounds: No murmur heard.   No friction rub. No gallop.  Pulmonary:      Effort: Pulmonary effort is normal. No respiratory distress.     Breath sounds: Normal breath sounds. No wheezing, rhonchi or rales.  Abdominal:     General: Bowel sounds are normal. There is no distension.     Palpations: Abdomen is soft. There is no mass.     Tenderness: There is no abdominal tenderness.  Musculoskeletal:        General: No swelling.     Right lower leg: No edema.     Left lower leg: No edema.  Lymphadenopathy:     Cervical: No cervical adenopathy.     Upper Body:     Right upper body: No supraclavicular or axillary adenopathy.     Left upper body: No supraclavicular or axillary adenopathy.     Lower Body: No right inguinal adenopathy. No left inguinal adenopathy.  Skin:    General: Skin is warm.     Coloration: Skin is not jaundiced.     Findings: No lesion or rash.  Neurological:     General: No focal deficit present.     Mental Status: She is alert and oriented to person, place, and time. Mental status is at baseline.  Psychiatric:        Mood and Affect: Mood normal.        Behavior: Behavior normal.        Thought Content: Thought content normal.   LABS:     Ref. Range 09/04/2021 11:08  Iron  Latest Ref Range: 28 - 170 ug/dL 50  UIBC Latest Units: ug/dL 359  TIBC Latest Ref Range: 250 - 450 ug/dL 409  Saturation Ratios Latest Ref Range: 10.4 - 31.8 % 12  Ferritin Latest Ref Range: 11 - 307 ng/mL 12   ASSESSMENT & PLAN:  Assessment/Plan:  A 80 y.o. female with essential thrombocythemia, as well as a history iron deficiency anemia.  When evaluating her labs today, I am pleased as her platelets remain well below 600.  She will continue to take hydroxyurea 500 mg every Monday, Wednesday, and Friday to keep her platelets from precipitously rising. Of note, she is on Eliquis due to her history of atrial fibrillation. This agent should also protect her from clotting complications related to her underlying essential thrombocythemia.  The only concern I have is the  gross hematuria she is having. I will get in contact with her urologist to notify him of this.  Her renal function is slightly worse than it has been in the past.  However, the patient only has 1 kidney.  Otherwise, as she is doing well from a hematologic standpoint, I will see her back in 6 months for repeat clinical assessment.  The patient understands all the plans discussed today and is in agreement with them.    I, Rita Ohara, am acting as scribe for Marice Potter, MD    I have reviewed this report as typed by the medical scribe, and it is complete and accurate.  Dequincy Macarthur Critchley, MD

## 2021-09-04 ENCOUNTER — Encounter: Payer: Self-pay | Admitting: Oncology

## 2021-09-04 ENCOUNTER — Telehealth: Payer: Self-pay

## 2021-09-04 ENCOUNTER — Inpatient Hospital Stay: Payer: Medicare Other

## 2021-09-04 ENCOUNTER — Inpatient Hospital Stay: Payer: Medicare Other | Attending: Oncology | Admitting: Oncology

## 2021-09-04 ENCOUNTER — Other Ambulatory Visit: Payer: Self-pay | Admitting: Oncology

## 2021-09-04 VITALS — BP 156/85 | HR 84 | Temp 98.6°F | Resp 14 | Ht 67.0 in | Wt 188.0 lb

## 2021-09-04 DIAGNOSIS — D509 Iron deficiency anemia, unspecified: Secondary | ICD-10-CM

## 2021-09-04 DIAGNOSIS — D473 Essential (hemorrhagic) thrombocythemia: Secondary | ICD-10-CM | POA: Diagnosis not present

## 2021-09-04 LAB — CBC AND DIFFERENTIAL
HCT: 40 (ref 36–46)
Hemoglobin: 12.7 (ref 12.0–16.0)
Neutrophils Absolute: 5.52
Platelets: 428 — AB (ref 150–399)
WBC: 8.9

## 2021-09-04 LAB — IRON AND TIBC
Iron: 50 ug/dL (ref 28–170)
Saturation Ratios: 12 % (ref 10.4–31.8)
TIBC: 409 ug/dL (ref 250–450)
UIBC: 359 ug/dL

## 2021-09-04 LAB — FERRITIN: Ferritin: 12 ng/mL (ref 11–307)

## 2021-09-04 LAB — CBC: RBC: 4.93 (ref 3.87–5.11)

## 2021-09-04 NOTE — Telephone Encounter (Addendum)
09/05/21 - Dr Bobby Rumpf reviewed the labs. He recommends that pt start iron. "She can take oral iron 2-3 times daily or we can schedule IV iron". He also asked me to send the office note and labs to Dr Tresa Moore, as pt is consistently having hematuria. He feels that Dr Tresa Moore may need to take a look at her clinical picture again.  I notified pt of Dr Bobby Rumpf recommendations. She prefers to try the oral iron first.   09/04/21: Pt notified that the iron results are not back as of yet. She plans to call in the morning.     ASSESSMENT & PLAN:  Assessment/Plan:  A 80 y.o. female with essential thrombocythemia, as well as a history iron deficiency anemia.  When evaluating her labs today, I am pleased as her platelets remain well below 600.  She will continue to take hydroxyurea 500 mg every Monday, Wednesday, and Friday to keep her platelets from precipitously rising. Of note, she is on Eliquis due to her history of atrial fibrillation. This agent should also protect her from clotting complications related to her underlying essential thrombocythemia.  The only concern I have is the gross hematuria she is having. I will get in contact with her urologist to notify him of this.  Her renal function is slightly worse than it has been in the past.  However, the patient only has 1 kidney.  Otherwise, as she is doing well from a hematologic standpoint, I will see her back in 6 months for repeat clinical assessment.  The patient understands all the plans discussed today and is in agreement with them.     I, Rita Ohara, am acting as scribe for Marice Potter, MD     I have reviewed this report as typed by the medical scribe, and it is complete and accurate.   Dequincy Macarthur Critchley, MD

## 2021-09-23 DIAGNOSIS — Z4689 Encounter for fitting and adjustment of other specified devices: Secondary | ICD-10-CM | POA: Diagnosis not present

## 2021-09-24 ENCOUNTER — Telehealth: Payer: Self-pay | Admitting: Cardiology

## 2021-09-24 MED ORDER — FLECAINIDE ACETATE 100 MG PO TABS: 100.0000 mg | ORAL_TABLET | Freq: Two times a day (BID) | ORAL | 1 refills | Status: DC

## 2021-09-24 NOTE — Telephone Encounter (Signed)
Refill sent in per request.  

## 2021-09-24 NOTE — Telephone Encounter (Signed)
*  STAT* If patient is at the pharmacy, call can be transferred to refill team.   1. Which medications need to be refilled? (please list name of each medication and dose if known) flecainide (TAMBOCOR) 100 MG tablet  2. Which pharmacy/location (including street and city if local pharmacy) is medication to be sent to? [place], [place]  3. Do they need a 30 day or 90 day supply? 60 ds

## 2021-09-30 ENCOUNTER — Other Ambulatory Visit (HOSPITAL_COMMUNITY): Payer: Self-pay

## 2021-10-19 ENCOUNTER — Other Ambulatory Visit: Payer: Self-pay | Admitting: Cardiology

## 2021-10-19 DIAGNOSIS — I480 Paroxysmal atrial fibrillation: Secondary | ICD-10-CM

## 2021-10-22 NOTE — Telephone Encounter (Signed)
Prescription refill request for Eliquis received. Indication: a fib Last office visit: 06/14/21 Scr: 1.62 Age: 80 Weight: 85

## 2021-11-08 ENCOUNTER — Ambulatory Visit: Payer: PPO | Admitting: Cardiology

## 2021-11-08 ENCOUNTER — Other Ambulatory Visit: Payer: Self-pay

## 2021-11-08 ENCOUNTER — Encounter: Payer: Self-pay | Admitting: Cardiology

## 2021-11-08 VITALS — BP 118/70 | HR 72 | Ht 67.0 in | Wt 189.0 lb

## 2021-11-08 DIAGNOSIS — I700 Atherosclerosis of aorta: Secondary | ICD-10-CM | POA: Diagnosis not present

## 2021-11-08 DIAGNOSIS — I480 Paroxysmal atrial fibrillation: Secondary | ICD-10-CM | POA: Diagnosis not present

## 2021-11-08 DIAGNOSIS — I440 Atrioventricular block, first degree: Secondary | ICD-10-CM | POA: Diagnosis not present

## 2021-11-08 DIAGNOSIS — I5032 Chronic diastolic (congestive) heart failure: Secondary | ICD-10-CM | POA: Diagnosis not present

## 2021-11-08 DIAGNOSIS — I2510 Atherosclerotic heart disease of native coronary artery without angina pectoris: Secondary | ICD-10-CM

## 2021-11-08 NOTE — Progress Notes (Signed)
Cardiology Office Note:    Date:  11/08/2021   ID:  [name], DOB 1941-03-09, MRN [id number]  PCP:  [name], MD  Cardiologist:  Jenne Campus, MD    Referring MD: [name], MD   Chief Complaint  Patient presents with   Follow-up    History of Present Illness:    [name] [name] is a 81 y.o. female   with past medical history significant for paroxysmal atrial fibrillation being successfully suppressed with flecainide seems to maintain sinus rhythm, she is anticoagulated, recently she was discovered to have frequent ventricular ectopy we will try to manage this with increasing dosages of beta-blocker but that can have make her feel worse therefore the dose was decreased.  Eventually she was seen by our EP team and they recommended increased dose of flecainide to 100 mg twice daily suggest that since that time palpitations seems to be improving.  She also had echocardiogram done which I reviewed with her today which is normal.  No palpitations no dizziness no swelling of lower extremities.Overall she is doing well.  She denies have any chest pain tightness squeezing pressure burning chest.  Denies have any palpitations  Past Medical History:  Diagnosis Date   Abdominal aortic atherosclerosis ([place]) 05/07/2016   Noted on CT abd/pelvis   Acute renal failure ([place]) 06/04/2013   Acute respiratory failure with hypoxia ([place]) 06/04/2013   Anemia, unspecified 06/08/2013   Anticoagulant long-term use    ELIQUIS   Cancer ([place])    uterine cancer    Chronic anticoagulation 08/04/2017   Chronic diastolic (congestive) heart failure ([place])     pt denies    Chronic diastolic congestive heart failure ([place]) 05/31/2015   Chronic kidney disease 2014   arf on dialysis in icu   Complication of anesthesia    2021- kidney removed at [facility] pt reports did not get enough med to wake her up and she could not talk and then recovered after given more of wake up med from anesthesia     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 Marvel Plan [facility] in [place])  pt uses pessary   Diarrhea 06/11/2013   Diverticulosis 05/07/2016   Noted on CT abd/pelvis   Dyslipidemia 05/31/2015   E coli bacteremia 06/08/2013   Encounter for monitoring flecainide therapy 07/30/2016   Essential thrombocythemia ([place]) 03/04/2021   Female genital prolapse 04/15/2016   First degree heart block    General weakness    GERD (gastroesophageal reflux disease)    Hematuria    intermittently due to ureter right stent   Hiatal hernia    High risk medication use 04/15/2016   History of arteriovenostomy for renal dialysis [facility])    History of colon polyps    History of gastric polyp    History of kidney infection    12-31-2018  perinephrtic abscess  right side  s/p drains x2 01-10-2019,  05/ 2020 drains removed   History of kidney stones    History of peptic ulcer disease    History of septic shock    2011 (pt unaware) and 06-04-2013  w/ acute renal failure   History of uterine cancer    STAGE I  --  S/P TAH W/ BSO  (NO OTHER TX)   History of ventilator dependency ([place]) 2014   in setting of septic shock   Hydronephrosis 05/07/2016   Hyperlipemia  04/15/2016   Hyperlipidemia    Hypertension    Iron deficiency anemia hematology/ oncology--- dr Bobby Rumpf at [facility] in [place]--- per lov note 08-07-2017  stabilized and felt to be more chronic anemia   01/ 2018  dx iron def. anemia  s/p  IV Iron infusion and taking oral iron supplement   Lactic acidosis 06/04/2013   Lumbar compression fracture ([place])    01-22-2019 per imaging L1  (06-01-2019 per pt currently no pain)   Midline cystocele 04/15/2016   Nonfunctioning kidney 03/23/2020   NSTEMI, initial episode of care [facility]) 06/07/2013   pt denies    Occlusion of ureteral stent ([place])    PAF (paroxysmal atrial fibrillation) ([place]) DX JUNE 2012   CARDIOLOGIST--  DR  Vernie Shanks--- [facility])  CHADS2   Palpitations 04/15/2016   Paroxysmal atrial fibrillation ([place]) 08/04/2017   Presence of pessary    Pyelonephritis 06/04/2013   Right wrist fracture    per pt fractured on 03-10-2019,  had closed reduction and wearing a brace   Sepsis ([place]) 05/07/2016   Septic shock(785.52) 06/04/2013   Tendonitis, Achilles 04/15/2016   Ureteral obstruction, right 06/04/2013   Ureteral stricture, right urologist-- dr Tresa Moore   Chronic--- treated with ureteral stent   Urinary tract infection without hematuria    Uses walker    and wheelchair for longer distance   Ventricular ectopy 12/28/2020   Wears glasses     Past Surgical History:  Procedure Laterality Date   APPENDECTOMY     BACK SURGERY     CARDIOVERSION  09/ 2018   dr Agustin Cree   successful ([facility] )   CHOLECYSTECTOMY  2010   COLONOSCOPY W/ POLYPECTOMY  05/2017   CYSTO/  BALLOON DILATION RIGHT URETERAL STRICTURE/ STENT PLACEMENT  06-02-2013   CYSTO/  BILATERAL RETROGRADE PYELOGRAM/ RIGHT URETEROSCOPY AND STENT PLACEMENT  10-04-2005   CYSTO/ LEFT URETEROSCOPIC STONE EXTRACTION  04-03-2006   CYSTOSCOPY W/ RETROGRADES Right 01/03/2019   Procedure: CYSTOSCOPY WITH RETROGRADE PYELOGRAM RIGHT WITH STENT EXCHANGE;  Surgeon: Irine Seal, MD;  Location: [facility];  Service: Urology;  Laterality: Right;   CYSTOSCOPY W/ URETERAL STENT PLACEMENT Right 02/06/2014   Procedure: CYSTOSCOPY WITH RIGHT RETROGRADE PYELOGRAM RIGHT STENT REMOVAL and REPLACEMENT, Right Ureteroscopy;  Surgeon: Ailene Rud, MD;  Location: [facility];  Service: Urology;  Laterality: Right;   CYSTOSCOPY W/ URETERAL STENT PLACEMENT Right 11/26/2016   Procedure: CYSTOSCOPY WITH RETROGRADE PYELOGRAM/URETERAL STENT REPLACEMENT;  Surgeon: Alexis Frock, MD;  Location: [facility];  Service: Urology;  Laterality: Right;   CYSTOSCOPY W/ URETERAL STENT PLACEMENT Right 04/24/2017   Procedure: CYSTOSCOPY WITH  RETROGRADE PYELOGRAM/URETERAL STENT REPLACEMENT;  Surgeon: Alexis Frock, MD;  Location: [facility];  Service: Urology;  Laterality: Right;   CYSTOSCOPY W/ URETERAL STENT PLACEMENT Right 10/07/2017   Procedure: CYSTOSCOPY WITH RETROGRADE PYELOGRAM/URETERAL STENT EXCHANGE;  Surgeon: Alexis Frock, MD;  Location: [facility];  Service: Urology;  Laterality: Right;   CYSTOSCOPY W/ URETERAL STENT PLACEMENT Right 03/03/2018   Procedure: CYSTOSCOPY WITH RIGHT RETROGRADE / RIGHT URETERAL STENT EXCHANGE;  Surgeon: Alexis Frock, MD;  [facility];  Service: Urology;  Laterality: Right;   CYSTOSCOPY W/ URETERAL STENT PLACEMENT Right 09/22/2018   Procedure: CYSTOSCOPY WITH RETROGRADE PYELOGRAM/URETERAL STENT PLACEMENT;  Surgeon: Alexis Frock, MD;  Location: [facility];  Service: Urology;  Laterality: Right;  45 MINS   CYSTOSCOPY W/ URETERAL STENT PLACEMENT Right 06/03/2019   Procedure: CYSTOSCOPY WITH  RETROGRADE PYELOGRAM/URETERAL STENT REPLACEMENT;  Surgeon: Alexis Frock, MD;  Location: [facility];  Service: Urology;  Laterality: Right;   CYSTOSCOPY W/ URETERAL STENT PLACEMENT Right 11/23/2019   Procedure: CYSTOSCOPY WITH RETROGRADE PYELOGRAM/URETERAL STENT PLACEMENT;  Surgeon: Alexis Frock, MD;  Location: [facility];  Service: Urology;  Laterality: Right;  45 MINS   CYSTOSCOPY WITH LITHOLAPAXY N/A 11/21/2013   Procedure: CYSTOSCOPY WITH LITHOLAPAXY;  Surgeon: Ailene Rud, MD;  Location: [facility];  Service: Urology;  Laterality: N/A;   CYSTOSCOPY WITH RETROGRADE PYELOGRAM, URETEROSCOPY AND STENT PLACEMENT Bilateral 03/15/2014   Procedure: CYSTOSCOPY WITH BILATERAL RETROGRADE PYELOGRAM, RIGHT DIAGNOSTIC URETEROSCOPY AND STENT EXCHANGE;  Surgeon: Alexis Frock, MD;  [facility];  Service: Urology;  Laterality: Bilateral;   CYSTOSCOPY WITH RETROGRADE PYELOGRAM, URETEROSCOPY AND STENT  PLACEMENT Left 03/13/2021   Procedure: FIRST STAGE: CYSTOSCOPY WITH RETROGRADE PYELOGRAM, URETEROSCOPY AND STENT PLACEMENT;  Surgeon: Alexis Frock, MD;  Location[facility];  Service: Urology;  Laterality: Left;  75 MINS   CYSTOSCOPY WITH RETROGRADE PYELOGRAM, URETEROSCOPY AND STENT PLACEMENT Left 03/29/2021   Procedure: SECOND STGAE:CYSTOSCOPY WITH RETROGRADE PYELOGRAM, URETEROSCOPY AND STENT EXCHANGE;  Surgeon: Alexis Frock, MD;  [facility];  Service: Urology;  Laterality: Left;   CYSTOSCOPY WITH STENT PLACEMENT Right 05/07/2016   Procedure: CYSTOSCOPY WITH RETROGRADE PYELOGRAM, URETERAL STENT PLACEMENT;  Surgeon: Franchot Gallo, MD;  Location[facility];  Service: Urology;  Laterality: Right;   EYE SURGERY     Bilateral cataract removal   HEMIARTHROPLASTY HIP Right 11/2016   fractured   HOLMIUM LASER APPLICATION Left 1/77/9390   Procedure: HOLMIUM LASER APPLICATION;  Surgeon: Alexis Frock, MD;  [facility];  Service: Urology;  Laterality: Left;   IR CATHETER TUBE CHANGE  02/04/2019   IR CATHETER TUBE CHANGE  02/04/2019   IR CATHETER TUBE CHANGE  02/11/2019   IR RADIOLOGIST EVAL & MGMT  03/09/2019   JOINT REPLACEMENT     LUMBAR FUSION  JULY 2013   NEPHROLITHOTOMY  X2 YRS AGO   PERCUTANEOUS NEPHROSTOMY  12/2018   ROBOT ASSISTED LAPAROSCOPIC NEPHRECTOMY Right 03/23/2020   Procedure: XI ROBOTIC ASSISTED LAPAROSCOPIC NEPHRECTOMY;  Surgeon: Alexis Frock, MD;  [facility];  Service: Urology;  Laterality: Right;   TOTAL ABDOMINAL HYSTERECTOMY W/ BILATERAL SALPINGOOPHORECTOMY  1989   TOTAL KNEE ARTHROPLASTY Bilateral 1992  &  1996   TRANSTHORACIC ECHOCARDIOGRAM  07-24-2017   dr Agustin Cree   ef 60-65% (improved from last echo 2014, was 45-50%)/  trace TR/  mild AV sclerosis without stenosis   URETEROSCOPY Right 11/21/2013   Procedure: URETEROSCOPY WITH STENT REMOVAL AND REPLACEMENT;  Surgeon: Ailene Rud, MD;  Location: [facility];  Service: Urology;   Laterality: Right;    Current Medications: Current Meds  Medication Sig   acetaminophen (TYLENOL) 500 MG tablet Take 1,000 mg by mouth every 6 (six) hours as needed for moderate pain or headache.    amitriptyline (ELAVIL) 25 MG tablet Take 25 mg by mouth at bedtime.   apixaban (ELIQUIS) 5 MG TABS tablet TAKE ONE TABLET BY MOUTH TWICE DAILY   ferrous sulfate 325 (65 FE) MG tablet Take 325 mg by mouth 2 (two) times daily.   flecainide (TAMBOCOR) 100 MG tablet Take 1 tablet (100 mg total) by mouth 2 (two) times daily.   hydroxyurea (HYDREA) 500 MG capsule TAKE ONE CAPSULE BY MOUTH every monday, WEDNESDAY & FRIDAY for elevated platelets   omeprazole (PRILOSEC) 40 MG capsule Take 40 mg by  mouth daily.    ondansetron (ZOFRAN-ODT) 4 MG disintegrating tablet Take 4 mg by mouth every 6 (six) hours as needed for nausea/vomiting.   promethazine (PHENERGAN) 25 MG tablet Take 25 mg by mouth every 8 (eight) hours as needed.   traMADol (ULTRAM) 50 MG tablet Take 1-2 tablets (50-100 mg total) by mouth every 6 (six) hours as needed for moderate pain (Post-operatively).   triamcinolone cream (KENALOG) 0.1 % Apply 1 application topically 2 (two) times daily as needed (eczema).     Allergies:   Codeine and Hydrocodone   Social History   Socioeconomic History   Marital status: Married    Spouse name: Not on file   Number of children: Not on file   Years of education: Not on file   Highest education level: Not on file  Occupational History   Occupation: Retired from [employer]  Tobacco Use   Smoking status: Never   Smokeless tobacco: Never  Vaping Use   Vaping Use: Never used  Substance and Sexual Activity   Alcohol use: No   Drug use: No   Sexual activity: Not on file  Other Topics Concern   Not on file  Social History Narrative   Lives in [place] with husband. Drives, walks some, but not very active at home.    Social Determinants of Health   Financial Resource Strain: Not on file  Food  Insecurity: Not on file  Transportation Needs: Not on file  Physical Activity: Not on file  Stress: Not on file  Social Connections: Not on file     Family History: The patient's family history includes Cancer in her mother; Heart disease in her father and mother; Heart failure in her father and mother. ROS:   Please see the history of present illness.    All 14 point review of systems negative except as described per history of present illness  EKGs/Labs/Other Studies Reviewed:      Recent Labs: 12/28/2020: Magnesium 1.9 03/30/2021: ALT 11 04/02/2021: BUN 20; Creatinine, Ser 1.62; Potassium 3.6; Sodium 135 09/04/2021: Hemoglobin 12.7; Platelets 428  Recent Lipid Panel No results found for: CHOL, TRIG, HDL, CHOLHDL, VLDL, LDLCALC, LDLDIRECT  Physical Exam:    VS:  BP 118/70 (BP Location: Left Arm, Patient Position: Sitting, Cuff Size: Normal)    Pulse 72    Ht 5\' 7"  (1.702 m)    Wt 189 lb (85.7 kg)    SpO2 97%    BMI 29.60 kg/m     Wt Readings from Last 3 Encounters:  11/08/21 189 lb (85.7 kg)  09/04/21 188 lb (85.3 kg)  06/14/21 188 lb 3.2 oz (85.4 kg)     GEN:  Well nourished, well developed in no acute distress HEENT: Normal NECK: No JVD; No carotid bruits LYMPHATICS: No lymphadenopathy CARDIAC: RRR, no murmurs, no rubs, no gallops RESPIRATORY:  Clear to auscultation without rales, wheezing or rhonchi  ABDOMEN: Soft, non-tender, non-distended MUSCULOSKELETAL:  No edema; No deformity  SKIN: Warm and dry LOWER EXTREMITIES: no swelling NEUROLOGIC:  Alert and oriented x 3 PSYCHIATRIC:  Normal affect   ASSESSMENT:    1. Paroxysmal atrial fibrillation (HCC)   2. Coronary artery disease involving native coronary artery of native heart without angina pectoris   3. First degree heart block   4. Chronic diastolic (congestive) heart failure ([place])   5. Abdominal aortic atherosclerosis (HCC)    PLAN:    In order of problems listed above:  Paroxysmal atrial fibrillation  maintaining sinus rhythm first-degree AV  block on flecainide will continue.  She is also anticoagulated.  Dose of Eliquis is adequate. Coronary artery disease stable from that point review and appropriate medication which I will continue. Dyslipidemia, she is reluctant to take any medication for it.  We will reassess her cholesterol and treat accordingly. Chronic diastolic congestive heart failure: Compensated   Medication Adjustments/Labs and Tests Ordered: Current medicines are reviewed at length with the patient today.  Concerns regarding medicines are outlined above.  No orders of the defined types were placed in this encounter.  Medication changes: No orders of the defined types were placed in this encounter.   Signed, Park Liter, MD, [facility] 11/08/2021 10:43 AM    [place]

## 2021-11-08 NOTE — Patient Instructions (Signed)
Medication Instructions:  °Your physician recommends that you continue on your current medications as directed. Please refer to the Current Medication list given to you today. ° °*If you need a refill on your cardiac medications before your next appointment, please call your pharmacy* ° ° °Lab Work: °None °If you have labs (blood work) drawn today and your tests are completely normal, you will receive your results only by: °MyChart Message (if you have MyChart) OR °A paper copy in the mail °If you have any lab test that is abnormal or we need to change your treatment, we will call you to review the results. ° ° °Testing/Procedures: °None ° ° °Follow-Up: °At [facility], you and your health needs are our priority.  As part of our continuing mission to provide you with exceptional heart care, we have created designated Provider Care Teams.  These Care Teams include your primary Cardiologist (physician) and Advanced Practice Providers (APPs -  Physician Assistants and Nurse Practitioners) who all work together to provide you with the care you need, when you need it. ° °We recommend signing up for the patient portal called "MyChart".  Sign up information is provided on this After Visit Summary.  MyChart is used to connect with patients for Virtual Visits (Telemedicine).  Patients are able to view lab/test results, encounter notes, upcoming appointments, etc.  Non-urgent messages can be sent to your provider as well.   °To learn more about what you can do with MyChart, go to [email or web address].   ° °Your next appointment:   °5 month(s) ° °The format for your next appointment:   °In Person ° °Provider:   °Robert Krasowski, MD  ° ° °Other Instructions °None ° °

## 2021-11-26 ENCOUNTER — Telehealth: Payer: Self-pay | Admitting: Cardiology

## 2021-11-26 MED ORDER — FLECAINIDE ACETATE 100 MG PO TABS: 100.0000 mg | ORAL_TABLET | Freq: Two times a day (BID) | ORAL | 3 refills | Status: DC

## 2021-11-26 NOTE — Telephone Encounter (Signed)
° ° °*  STAT* If patient is at the pharmacy, call can be transferred to refill team.   1. Which medications need to be refilled? (please list name of each medication and dose if known) flecainide (TAMBOCOR) 100 MG tablet  2. Which pharmacy/location (including street and city if local pharmacy) is medication to be sent to? [place], [place]  3. Do they need a 30 day or 90 day supply? 90 days  Pt needs refill today, she is out of meds

## 2021-11-26 NOTE — Telephone Encounter (Signed)
Refill sent in per request.  

## 2021-12-01 ENCOUNTER — Other Ambulatory Visit: Payer: Self-pay | Admitting: Hematology and Oncology

## 2021-12-01 DIAGNOSIS — D693 Immune thrombocytopenic purpura: Secondary | ICD-10-CM

## 2022-01-20 DIAGNOSIS — N3000 Acute cystitis without hematuria: Secondary | ICD-10-CM | POA: Diagnosis not present

## 2022-01-20 DIAGNOSIS — Z905 Acquired absence of kidney: Secondary | ICD-10-CM | POA: Diagnosis not present

## 2022-01-20 DIAGNOSIS — N202 Calculus of kidney with calculus of ureter: Secondary | ICD-10-CM | POA: Diagnosis not present

## 2022-01-27 ENCOUNTER — Other Ambulatory Visit: Payer: Self-pay | Admitting: Cardiology

## 2022-01-27 DIAGNOSIS — I480 Paroxysmal atrial fibrillation: Secondary | ICD-10-CM

## 2022-01-27 NOTE — Telephone Encounter (Signed)
Prescription refill request for Eliquis received. ?Indication:Afib ?Last office visit:1/23 ?Scr:1.6 ?Age: 80 ?Weight:85.7 kg ? ?Prescription refilled ? ?

## 2022-02-20 DIAGNOSIS — Z4689 Encounter for fitting and adjustment of other specified devices: Secondary | ICD-10-CM | POA: Diagnosis not present

## 2022-03-03 NOTE — Progress Notes (Signed)
?[facility]  ?[address] ?[place],  [place]  [address] ?[phone] ? ?Clinic Day:  03/04/2022 ? ?Referring physician: [name], MD ? ?HISTORY OF PRESENT ILLNESS:  ?The patient is a 81 y.o. female with essential thrombocythemia, which is based upon her having an elevated platelet count and testing positive for the JAK2 mutation.  She also has a history of iron deficiency anemia.  She takes hydroxyurea 500 mg every Monday, Wednesday and Friday to keep her platelet count under 400-600.  She comes in today to reassess her peripheral counts.  Since her last visit, the patient has been doing fairly well.  She denies having any side effects from her hydroxyurea.  She also denies having any clotting complications from her underlying essential thrombocythemia.  ? ?PHYSICAL EXAM:  ?Blood pressure (!) 167/81, pulse 79, temperature 98.7 ?F (37.1 ?C), resp. rate 16, height '5\' 7"'$  (1.702 m), weight 189 lb (85.7 kg), SpO2 95 %. ?Wt Readings from Last 3 Encounters:  ?03/04/22 189 lb (85.7 kg)  ?11/08/21 189 lb (85.7 kg)  ?09/04/21 188 lb (85.3 kg)  ? ?Body mass index is 29.6 kg/m?[name] ?Performance status (ECOG): 1 ?Physical Exam ?Constitutional:   ?   Appearance: Normal appearance. She is not ill-appearing.  ?HENT:  ?   Mouth/Throat:  ?   Mouth: Mucous membranes are moist.  ?   Pharynx: Oropharynx is clear. No oropharyngeal exudate or posterior oropharyngeal erythema.  ?Cardiovascular:  ?   Rate and Rhythm: Normal rate and regular rhythm.  ?   Heart sounds: No murmur heard. ?  No friction rub. No gallop.  ?Pulmonary:  ?   Effort: Pulmonary effort is normal. No respiratory distress.  ?   Breath sounds: Normal breath sounds. No wheezing, rhonchi or rales.  ?Abdominal:  ?   General: Bowel sounds are normal. There is no distension.  ?   Palpations: Abdomen is soft. There is no mass.  ?   Tenderness: There is no abdominal tenderness.  ?Musculoskeletal:     ?   General: No swelling.  ?   Right  lower leg: No edema.  ?   Left lower leg: No edema.  ?Lymphadenopathy:  ?   Cervical: No cervical adenopathy.  ?   Upper Body:  ?   Right upper body: No supraclavicular or axillary adenopathy.  ?   Left upper body: No supraclavicular or axillary adenopathy.  ?   Lower Body: No right inguinal adenopathy. No left inguinal adenopathy.  ?Skin: ?   General: Skin is warm.  ?   Coloration: Skin is not jaundiced.  ?   Findings: No lesion or rash.  ?Neurological:  ?   General: No focal deficit present.  ?   Mental Status: She is alert and oriented to person, place, and time. Mental status is at baseline.  ?Psychiatric:     ?   Mood and Affect: Mood normal.     ?   Behavior: Behavior normal.     ?   Thought Content: Thought content normal.  ? ?LABS:  ? ?ASSESSMENT & PLAN:  ?Assessment/Plan:  A 81 y.o. female with essential thrombocythemia, as well as a history iron deficiency anemia.  When evaluating her labs today, I am pleased as her platelets remain well below 600.  She will continue to take hydroxyurea 500 mg every Monday, Wednesday, and Friday to keep her platelets from precipitously rising. Of note, she is on Eliquis due to her history of atrial fibrillation.  This agent should also protect her from clotting complications related to her underlying essential thrombocythemia. I am also pleased as her hemoglobin is normal at 13.8.  In the past, this patient had issues of iron deficiency anemia.  Clinically, the patient appears to be doing well.  As that is the case, I will see the patient back in 6 months for repeat clinical assessment.  The patient understands all the plans discussed today and is in agreement with them.   ? ?[name] Aversa Macarthur Critchley, MD ? ? ? ?  ?

## 2022-03-04 ENCOUNTER — Inpatient Hospital Stay: Payer: PPO

## 2022-03-04 ENCOUNTER — Telehealth: Payer: Self-pay | Admitting: Oncology

## 2022-03-04 ENCOUNTER — Inpatient Hospital Stay: Payer: PPO | Attending: Oncology | Admitting: Oncology

## 2022-03-04 ENCOUNTER — Other Ambulatory Visit: Payer: Self-pay | Admitting: Oncology

## 2022-03-04 ENCOUNTER — Telehealth: Payer: Self-pay

## 2022-03-04 VITALS — BP 167/81 | HR 79 | Temp 98.7°F | Resp 16 | Ht 67.0 in | Wt 189.0 lb

## 2022-03-04 DIAGNOSIS — D509 Iron deficiency anemia, unspecified: Secondary | ICD-10-CM | POA: Diagnosis not present

## 2022-03-04 DIAGNOSIS — D649 Anemia, unspecified: Secondary | ICD-10-CM | POA: Diagnosis not present

## 2022-03-04 DIAGNOSIS — D473 Essential (hemorrhagic) thrombocythemia: Secondary | ICD-10-CM | POA: Diagnosis not present

## 2022-03-04 DIAGNOSIS — Z862 Personal history of diseases of the blood and blood-forming organs and certain disorders involving the immune mechanism: Secondary | ICD-10-CM | POA: Diagnosis not present

## 2022-03-04 LAB — IRON AND TIBC
Iron: 140 ug/dL (ref 28–170)
Saturation Ratios: 42 % — ABNORMAL HIGH (ref 10.4–31.8)
TIBC: 333 ug/dL (ref 250–450)
UIBC: 193 ug/dL

## 2022-03-04 LAB — CBC AND DIFFERENTIAL
HCT: 43 (ref 36–46)
Hemoglobin: 13.8 (ref 12.0–16.0)
Neutrophils Absolute: 5.46
Platelets: 407 10*3/uL — AB (ref 150–400)
WBC: 8.4

## 2022-03-04 LAB — CBC: RBC: 4.68 (ref 3.87–5.11)

## 2022-03-04 LAB — FERRITIN: Ferritin: 38 ng/mL (ref 11–307)

## 2022-03-04 NOTE — Telephone Encounter (Signed)
Per 03/04/22 los next appt scheduled and confirmed with patient ?

## 2022-03-04 NOTE — Telephone Encounter (Addendum)
03/05/22 @ 0932 - Pt notified of below and verbalized understanding. ? ?03/05/22 - Lab results are back. Dr Bobby Rumpf reviewed iron results.He states to tell pt that all her numbers look good.  ? Latest Reference Range & Units 03/04/22 16:17  ?Iron 28 - 170 ug/dL 140  ?UIBC ug/dL 193  ?TIBC 250 - 450 ug/dL 333  ?Saturation Ratios 10.4 - 31.8 % 42 (H)  ?Ferritin 11 - 307 ng/mL 38  ?(H): Data is abnormally high ? ?03/04/2022 - Pt states Dr Bobby Rumpf told her to call back this afternoon to get lab results. Results are not back yet. ?

## 2022-03-14 DIAGNOSIS — H524 Presbyopia: Secondary | ICD-10-CM | POA: Diagnosis not present

## 2022-04-11 ENCOUNTER — Ambulatory Visit: Payer: PPO | Admitting: Cardiology

## 2022-06-04 DIAGNOSIS — N819 Female genital prolapse, unspecified: Secondary | ICD-10-CM | POA: Diagnosis not present

## 2022-06-04 DIAGNOSIS — Z4689 Encounter for fitting and adjustment of other specified devices: Secondary | ICD-10-CM | POA: Diagnosis not present

## 2022-06-26 DIAGNOSIS — J329 Chronic sinusitis, unspecified: Secondary | ICD-10-CM | POA: Diagnosis not present

## 2022-06-26 DIAGNOSIS — R110 Nausea: Secondary | ICD-10-CM | POA: Diagnosis not present

## 2022-07-17 ENCOUNTER — Other Ambulatory Visit: Payer: Self-pay | Admitting: Cardiology

## 2022-07-17 DIAGNOSIS — I480 Paroxysmal atrial fibrillation: Secondary | ICD-10-CM

## 2022-07-17 NOTE — Telephone Encounter (Signed)
Rx refill sent to pharmacy. 

## 2022-07-21 DIAGNOSIS — N202 Calculus of kidney with calculus of ureter: Secondary | ICD-10-CM | POA: Diagnosis not present

## 2022-07-21 DIAGNOSIS — Z905 Acquired absence of kidney: Secondary | ICD-10-CM | POA: Diagnosis not present

## 2022-07-21 DIAGNOSIS — N3000 Acute cystitis without hematuria: Secondary | ICD-10-CM | POA: Diagnosis not present

## 2022-08-01 ENCOUNTER — Ambulatory Visit: Payer: PPO | Attending: Cardiology | Admitting: Cardiology

## 2022-08-01 ENCOUNTER — Encounter: Payer: Self-pay | Admitting: Cardiology

## 2022-08-01 VITALS — BP 136/72 | HR 75 | Ht 67.0 in | Wt 186.4 lb

## 2022-08-01 DIAGNOSIS — R0609 Other forms of dyspnea: Secondary | ICD-10-CM | POA: Diagnosis not present

## 2022-08-01 DIAGNOSIS — E785 Hyperlipidemia, unspecified: Secondary | ICD-10-CM

## 2022-08-01 DIAGNOSIS — I480 Paroxysmal atrial fibrillation: Secondary | ICD-10-CM

## 2022-08-01 DIAGNOSIS — I10 Essential (primary) hypertension: Secondary | ICD-10-CM | POA: Diagnosis not present

## 2022-08-01 DIAGNOSIS — I2510 Atherosclerotic heart disease of native coronary artery without angina pectoris: Secondary | ICD-10-CM | POA: Diagnosis not present

## 2022-08-01 NOTE — Progress Notes (Signed)
Cardiology Office Note:    Date:  08/01/2022   ID:  [name] [name], DOB September 14, 1941, MRN [id number]  PCP:  [name], MD  Cardiologist:  Jenne Campus, MD    Referring MD: [name], MD   No chief complaint on file.   History of Present Illness:    [name] [name] is a 81 y.o. female   with past medical history significant for paroxysmal atrial fibrillation being successfully suppressed with flecainide seems to maintain sinus rhythm, she is anticoagulated, recently she was discovered to have frequent ventricular ectopy we will try to manage this with increasing dosages of beta-blocker but that can have make her feel worse therefore the dose was decreased.  Eventually she was seen by our EP team and they recommended increased dose of flecainide to 100 mg twice daily suggest that since that time palpitations seems to be improving.  She comes today to my office for follow-up.  Overall she is doing well.  She complain of being weak tired exhausted.  She spent majority of time sitting on the chair and watching TV.  No palpitations no chest pain tightness squeezing pressure burning chest.  Past Medical History:  Diagnosis Date   Abdominal aortic atherosclerosis ([place]) 05/07/2016   Noted on CT abd/pelvis   Acute renal failure ([place]) 06/04/2013   Acute respiratory failure with hypoxia ([place]) 06/04/2013   Anemia, unspecified 06/08/2013   Anticoagulant long-term use    ELIQUIS   Cancer ([place])    uterine cancer    Chronic anticoagulation 08/04/2017   Chronic diastolic (congestive) heart failure ([place])     pt denies    Chronic diastolic congestive heart failure ([place]) 05/31/2015   Chronic kidney disease 2014   arf on dialysis in icu   Complication of anesthesia    2021- kidney removed at [facility] pt reports did not get enough med to wake her up and she could not talk and then recovered after given more of wake up med from anesthesia    Congenital vaginal enterocele 04/15/2016    Coronary artery disease    Cystocele, midline    followed by dr c. Marvel Plan [facility] in [place])  pt uses pessary   Diarrhea 06/11/2013   Diverticulosis 05/07/2016   Noted on CT abd/pelvis   Dyslipidemia 05/31/2015   E coli bacteremia 06/08/2013   Encounter for monitoring flecainide therapy 07/30/2016   Essential thrombocythemia ([place]) 03/04/2021   Female genital prolapse 04/15/2016   First degree heart block    General weakness    GERD (gastroesophageal reflux disease)    Hematuria    intermittently due to ureter right stent   Hiatal hernia    High risk medication use 04/15/2016   History of arteriovenostomy for renal dialysis [facility])    History of colon polyps    History of gastric polyp    History of kidney infection    12-31-2018  perinephrtic abscess  right side  s/p drains x2 01-10-2019,  05/ 2020 drains removed   History of kidney stones    History of peptic ulcer disease    History of septic shock    2011 (pt unaware) and 06-04-2013  w/ acute renal failure   History of uterine cancer    STAGE I  --  S/P TAH W/ BSO  (NO OTHER TX)   History of ventilator dependency ([place]) 2014   in setting of septic shock   Hydronephrosis 05/07/2016   Hyperlipemia 04/15/2016   Hyperlipidemia  Hypertension    Iron deficiency anemia hematology/ oncology--- dr Bobby Rumpf at [facility] in [place]--- per lov note 08-07-2017  stabilized and felt to be more chronic anemia   01/ 2018  dx iron def. anemia  s/p  IV Iron infusion and taking oral iron supplement   Lactic acidosis 06/04/2013   Lumbar compression fracture ([place])    01-22-2019 per imaging L1  (06-01-2019 per pt currently no pain)   Midline cystocele 04/15/2016   Nonfunctioning kidney 03/23/2020   NSTEMI, initial episode of care [facility]) 06/07/2013   pt denies    Occlusion of ureteral stent ([place])    PAF (paroxysmal atrial fibrillation) ([place]) DX JUNE 2012   CARDIOLOGIST--  DR Vernie Shanks--- [facility])  CHADS2   Palpitations 04/15/2016   Paroxysmal atrial fibrillation ([place]) 08/04/2017   Presence of pessary    Pyelonephritis 06/04/2013   Right wrist fracture    per pt fractured on 03-10-2019,  had closed reduction and wearing a brace   Sepsis ([place]) 05/07/2016   Septic shock(785.52) 06/04/2013   Tendonitis, Achilles 04/15/2016   Ureteral obstruction, right 06/04/2013   Ureteral stricture, right urologist-- dr Tresa Moore   Chronic--- treated with ureteral stent   Urinary tract infection without hematuria    Uses walker    and wheelchair for longer distance   Ventricular ectopy 12/28/2020   Wears glasses     Past Surgical History:  Procedure Laterality Date   APPENDECTOMY     BACK SURGERY     CARDIOVERSION  09/ 2018   dr Agustin Cree   successful ([facility] )   CHOLECYSTECTOMY  2010   COLONOSCOPY W/ POLYPECTOMY  05/2017   CYSTO/  BALLOON DILATION RIGHT URETERAL STRICTURE/ STENT PLACEMENT  06-02-2013   CYSTO/  BILATERAL RETROGRADE PYELOGRAM/ RIGHT URETEROSCOPY AND STENT PLACEMENT  10-04-2005   CYSTO/ LEFT URETEROSCOPIC STONE EXTRACTION  04-03-2006   CYSTOSCOPY W/ RETROGRADES Right 01/03/2019   Procedure: CYSTOSCOPY WITH RETROGRADE PYELOGRAM RIGHT WITH STENT EXCHANGE;  Surgeon: Irine Seal, MD;  Location: [facility];  Service: Urology;  Laterality: Right;   CYSTOSCOPY W/ URETERAL STENT PLACEMENT Right 02/06/2014   Procedure: CYSTOSCOPY WITH RIGHT RETROGRADE PYELOGRAM RIGHT STENT REMOVAL and REPLACEMENT, Right Ureteroscopy;  Surgeon: Ailene Rud, MD;  Location: [facility];  Service: Urology;  Laterality: Right;   CYSTOSCOPY W/ URETERAL STENT PLACEMENT Right 11/26/2016   Procedure: CYSTOSCOPY WITH RETROGRADE PYELOGRAM/URETERAL STENT REPLACEMENT;  Surgeon: Alexis Frock, MD;  Location: [facility];  Service: Urology;  Laterality: Right;   CYSTOSCOPY W/ URETERAL STENT PLACEMENT Right 04/24/2017   Procedure: CYSTOSCOPY WITH RETROGRADE PYELOGRAM/URETERAL STENT  REPLACEMENT;  Surgeon: Alexis Frock, MD;  Location: [facility];  Service: Urology;  Laterality: Right;   CYSTOSCOPY W/ URETERAL STENT PLACEMENT Right 10/07/2017   Procedure: CYSTOSCOPY WITH RETROGRADE PYELOGRAM/URETERAL STENT EXCHANGE;  Surgeon: Alexis Frock, MD;  Location: [facility];  Service: Urology;  Laterality: Right;   CYSTOSCOPY W/ URETERAL STENT PLACEMENT Right 03/03/2018   Procedure: CYSTOSCOPY WITH RIGHT RETROGRADE / RIGHT URETERAL STENT EXCHANGE;  Surgeon: Alexis Frock, MD;  [facility];  Service: Urology;  Laterality: Right;   CYSTOSCOPY W/ URETERAL STENT PLACEMENT Right 09/22/2018   Procedure: CYSTOSCOPY WITH RETROGRADE PYELOGRAM/URETERAL STENT PLACEMENT;  Surgeon: Alexis Frock, MD;  Location: [facility];  Service: Urology;  Laterality: Right;  45 MINS   CYSTOSCOPY W/ URETERAL STENT PLACEMENT Right 06/03/2019   Procedure: CYSTOSCOPY WITH RETROGRADE PYELOGRAM/URETERAL STENT REPLACEMENT;  Surgeon: Tresa Moore,  Hubbard Robinson, MD;  Location: [facility];  Service: Urology;  Laterality: Right;   CYSTOSCOPY W/ URETERAL STENT PLACEMENT Right 11/23/2019   Procedure: CYSTOSCOPY WITH RETROGRADE PYELOGRAM/URETERAL STENT PLACEMENT;  Surgeon: Alexis Frock, MD;  Location: [facility];  Service: Urology;  Laterality: Right;  45 MINS   CYSTOSCOPY WITH LITHOLAPAXY N/A 11/21/2013   Procedure: CYSTOSCOPY WITH LITHOLAPAXY;  Surgeon: Ailene Rud, MD;  Location: [facility];  Service: Urology;  Laterality: N/A;   CYSTOSCOPY WITH RETROGRADE PYELOGRAM, URETEROSCOPY AND STENT PLACEMENT Bilateral 03/15/2014   Procedure: CYSTOSCOPY WITH BILATERAL RETROGRADE PYELOGRAM, RIGHT DIAGNOSTIC URETEROSCOPY AND STENT EXCHANGE;  Surgeon: Alexis Frock, MD;  Location[facility];  Service: Urology;  Laterality: Bilateral;   CYSTOSCOPY WITH RETROGRADE PYELOGRAM, URETEROSCOPY AND STENT PLACEMENT Left 03/13/2021   Procedure:  FIRST STAGE: CYSTOSCOPY WITH RETROGRADE PYELOGRAM, URETEROSCOPY AND STENT PLACEMENT;  Surgeon: Alexis Frock, MD;  Location[facility];  Service: Urology;  Laterality: Left;  75 MINS   CYSTOSCOPY WITH RETROGRADE PYELOGRAM, URETEROSCOPY AND STENT PLACEMENT Left 03/29/2021   Procedure: SECOND STGAE:CYSTOSCOPY WITH RETROGRADE PYELOGRAM, URETEROSCOPY AND STENT EXCHANGE;  Surgeon: Alexis Frock, MD;  Location[facility];  Service: Urology;  Laterality: Left;   CYSTOSCOPY WITH STENT PLACEMENT Right 05/07/2016   Procedure: CYSTOSCOPY WITH RETROGRADE PYELOGRAM, URETERAL STENT PLACEMENT;  Surgeon: Franchot Gallo, MD;  Location[facility];  Service: Urology;  Laterality: Right;   EYE SURGERY     Bilateral cataract removal   HEMIARTHROPLASTY HIP Right 11/2016   fractured   HOLMIUM LASER APPLICATION Left 2/35/3614   Procedure: HOLMIUM LASER APPLICATION;  Surgeon: Alexis Frock, MD;  Location[facility];  Service: Urology;  Laterality: Left;   IR CATHETER TUBE CHANGE  02/04/2019   IR CATHETER TUBE CHANGE  02/04/2019   IR CATHETER TUBE CHANGE  02/11/2019   IR RADIOLOGIST EVAL & MGMT  03/09/2019   JOINT REPLACEMENT     LUMBAR FUSION  JULY 2013   NEPHROLITHOTOMY  X2 YRS AGO   PERCUTANEOUS NEPHROSTOMY  12/2018   ROBOT ASSISTED LAPAROSCOPIC NEPHRECTOMY Right 03/23/2020   Procedure: XI ROBOTIC ASSISTED LAPAROSCOPIC NEPHRECTOMY;  Surgeon: Alexis Frock, MD;  Location[facility];  Service: Urology;  Laterality: Right;   TOTAL ABDOMINAL HYSTERECTOMY W/ BILATERAL SALPINGOOPHORECTOMY  1989   TOTAL KNEE ARTHROPLASTY Bilateral 1992  &  1996   TRANSTHORACIC ECHOCARDIOGRAM  07-24-2017   dr Agustin Cree   ef 60-65% (improved from last echo 2014, was 45-50%)/  trace TR/  mild AV sclerosis without stenosis   URETEROSCOPY Right 11/21/2013   Procedure: URETEROSCOPY WITH STENT REMOVAL AND REPLACEMENT;  Surgeon: Ailene Rud, MD;  Location: [facility];  Service: Urology;  Laterality: Right;    Current  Medications: Current Meds  Medication Sig   acetaminophen (TYLENOL) 500 MG tablet Take 1,000 mg by mouth every 6 (six) hours as needed for moderate pain or headache.    amitriptyline (ELAVIL) 25 MG tablet Take 25 mg by mouth at bedtime.   apixaban (ELIQUIS) 5 MG TABS tablet Take 1 tablet (5 mg total) by mouth 2 (two) times daily.   flecainide (TAMBOCOR) 100 MG tablet Take 1 tablet (100 mg total) by mouth 2 (two) times daily.   hydroxyurea (HYDREA) 500 MG capsule TAKE ONE CAPSULE BY MOUTH every monday, WEDNESDAY & FRIDAY for elevated platelets   omeprazole (PRILOSEC) 40 MG capsule Take 40 mg by mouth daily.    ondansetron (ZOFRAN-ODT) 4 MG disintegrating tablet Take 4 mg by mouth every 6 (six) hours  as needed for nausea/vomiting.   promethazine (PHENERGAN) 25 MG tablet Take 25 mg by mouth every 8 (eight) hours as needed.   triamcinolone cream (KENALOG) 0.1 % Apply 1 application topically 2 (two) times daily as needed (eczema).     Allergies:   Codeine and Hydrocodone   Social History   Socioeconomic History   Marital status: Married    Spouse name: Not on file   Number of children: Not on file   Years of education: Not on file   Highest education level: Not on file  Occupational History   Occupation: Retired from [employer]  Tobacco Use   Smoking status: Never   Smokeless tobacco: Never  Vaping Use   Vaping Use: Never used  Substance and Sexual Activity   Alcohol use: No   Drug use: No   Sexual activity: Not on file  Other Topics Concern   Not on file  Social History Narrative   Lives in [place] with husband. Drives, walks some, but not very active at home.    Social Determinants of Health   Financial Resource Strain: Not on file  Food Insecurity: Not on file  Transportation Needs: Not on file  Physical Activity: Not on file  Stress: Not on file  Social Connections: Not on file     Family History: The patient's family history includes Cancer in her mother; Heart  disease in her father and mother; Heart failure in her father and mother. ROS:   Please see the history of present illness.    All 14 point review of systems negative except as described per history of present illness  EKGs/Labs/Other Studies Reviewed:      Recent Labs: 03/04/2022: Hemoglobin 13.8; Platelets 407  Recent Lipid Panel No results found for: "CHOL", "TRIG", "HDL", "CHOLHDL", "VLDL", "LDLCALC", "LDLDIRECT"  Physical Exam:    VS:  BP 136/72 (BP Location: Right Arm, Patient Position: Sitting)   Pulse 75   Ht '5\' 7"'$  (1.702 m)   Wt 186 lb 6.4 oz (84.6 kg)   SpO2 97%   BMI 29.19 kg/m     Wt Readings from Last 3 Encounters:  08/01/22 186 lb 6.4 oz (84.6 kg)  03/04/22 189 lb (85.7 kg)  11/08/21 189 lb (85.7 kg)     GEN:  Well nourished, well developed in no acute distress HEENT: Normal NECK: No JVD; No carotid bruits LYMPHATICS: No lymphadenopathy CARDIAC: RRR, no murmurs, no rubs, no gallops RESPIRATORY:  Clear to auscultation without rales, wheezing or rhonchi  ABDOMEN: Soft, non-tender, non-distended MUSCULOSKELETAL:  No edema; No deformity  SKIN: Warm and dry LOWER EXTREMITIES: no swelling NEUROLOGIC:  Alert and oriented x 3 PSYCHIATRIC:  Normal affect   ASSESSMENT:    1. Paroxysmal atrial fibrillation (HCC)   2. Coronary artery disease involving native coronary artery of native heart without angina pectoris   3. Primary hypertension   4. Dyslipidemia    PLAN:    In order of problems listed above:  Paroxysmal atrial fibrillation.  Maintain sinus rhythm, on flecainide and Eliquis which I will continue. Coronary disease, stable no problems.  Continue present management Essential hypertension blood pressure well controlled continue present management. Dyslipidemia I do not see fasting lipid profile since 2020.  I will ask her to have Cholesterol checked.   Medication Adjustments/Labs and Tests Ordered: Current medicines are reviewed at length with the  patient today.  Concerns regarding medicines are outlined above.  No orders of the defined types were placed in this encounter.  Medication changes: No orders of the defined types were placed in this encounter.   Signed, Park Liter, MD, [facility] 08/01/2022 10:35 AM    [place]

## 2022-08-01 NOTE — Patient Instructions (Addendum)
Medication Instructions:  Your physician recommends that you continue on your current medications as directed. Please refer to the Current Medication list given to you today.  *If you need a refill on your cardiac medications before your next appointment, please call your pharmacy*   Lab Work:  You need to have labs done when you are fasting.  You can come Monday through Friday 8:30 am to 12:00 pm and 1:15 to 4:30. You do not need to make an appointment as the order has already been placed. The labs you are going to have done are Lipids.    Testing/Procedures: Your physician has requested that you have an echocardiogram. Echocardiography is a painless test that uses sound waves to create images of your heart. It provides your doctor with information about the size and shape of your heart and how well your heart's chambers and valves are working. This procedure takes approximately one hour. There are no restrictions for this procedure.    Follow-Up: At [facility], you and your health needs are our priority.  As part of our continuing mission to provide you with exceptional heart care, we have created designated Provider Care Teams.  These Care Teams include your primary Cardiologist (physician) and Advanced Practice Providers (APPs -  Physician Assistants and Nurse Practitioners) who all work together to provide you with the care you need, when you need it.  We recommend signing up for the patient portal called "MyChart".  Sign up information is provided on this After Visit Summary.  MyChart is used to connect with patients for Virtual Visits (Telemedicine).  Patients are able to view lab/test results, encounter notes, upcoming appointments, etc.  Non-urgent messages can be sent to your provider as well.   To learn more about what you can do with MyChart, go to [email or web address].    Your next appointment:   6 month(s)  The format for your next appointment:   In Person  Provider:    Jenne Campus, MD    Other Instructions NA

## 2022-08-04 DIAGNOSIS — E785 Hyperlipidemia, unspecified: Secondary | ICD-10-CM | POA: Diagnosis not present

## 2022-08-05 LAB — LIPID PANEL
Chol/HDL Ratio: 3.4 ratio (ref 0.0–4.4)
Cholesterol, Total: 179 mg/dL (ref 100–199)
HDL: 53 mg/dL (ref >39)
LDL Chol Calc (NIH): 99 mg/dL (ref 0–99)
Triglycerides: 157 mg/dL — ABNORMAL HIGH (ref 0–149)
VLDL Cholesterol Cal: 27 mg/dL (ref 5–40)

## 2022-08-10 ENCOUNTER — Other Ambulatory Visit: Payer: Self-pay | Admitting: Hematology and Oncology

## 2022-08-10 DIAGNOSIS — D693 Immune thrombocytopenic purpura: Secondary | ICD-10-CM

## 2022-08-15 ENCOUNTER — Telehealth: Payer: Self-pay

## 2022-08-15 NOTE — Telephone Encounter (Signed)
-----   Message from Park Liter, MD sent at 08/05/2022 10:06 AM EDT ----- [place] acceptable, continue present management

## 2022-08-15 NOTE — Telephone Encounter (Signed)
Patient notified of results via mychart

## 2022-08-27 ENCOUNTER — Ambulatory Visit: Payer: PPO | Attending: Cardiology

## 2022-08-27 DIAGNOSIS — R0609 Other forms of dyspnea: Secondary | ICD-10-CM | POA: Diagnosis not present

## 2022-08-27 LAB — ECHOCARDIOGRAM COMPLETE
Area-P 1/2: 1.79 cm2
S' Lateral: 2.8 cm

## 2022-09-03 ENCOUNTER — Other Ambulatory Visit: Payer: Self-pay

## 2022-09-03 DIAGNOSIS — D509 Iron deficiency anemia, unspecified: Secondary | ICD-10-CM

## 2022-09-03 NOTE — Progress Notes (Signed)
[facility]  [address] [place],  [place]  [address] [phone]  Clinic Day:  09/04/2022  Referring physician: [name], MD  HISTORY OF PRESENT ILLNESS:  The patient is a 81 y.o. female with essential thrombocythemia, which is based upon her having an elevated platelet count and testing positive for the JAK2 mutation.  She also has a history of iron deficiency anemia.  She takes hydroxyurea 500 mg every Monday, Wednesday and Friday to keep her platelet count under 400-600.  She comes in today to reassess her peripheral counts.  Since her last visit, the patient has been doing fairly well.  She denies having any side effects from her hydroxyurea.  She also denies having any clotting complications from her underlying essential thrombocythemia.   PHYSICAL EXAM:  Blood pressure (!) 176/80, pulse 81, temperature 98.9 F (37.2 C), resp. rate 16, height '5\' 7"'$  (1.702 m), weight 187 lb 3.2 oz (84.9 kg), SpO2 95 %. Wt Readings from Last 3 Encounters:  09/04/22 187 lb 3.2 oz (84.9 kg)  08/01/22 186 lb 6.4 oz (84.6 kg)  03/04/22 189 lb (85.7 kg)   Body mass index is 29.32 kg/m. Performance status (ECOG): 1 Physical Exam Constitutional:      Appearance: Normal appearance. She is not ill-appearing.     Comments: She is ambulating with a walker  HENT:     Mouth/Throat:     Mouth: Mucous membranes are moist.     Pharynx: Oropharynx is clear. No oropharyngeal exudate or posterior oropharyngeal erythema.  Cardiovascular:     Rate and Rhythm: Normal rate and regular rhythm.     Heart sounds: No murmur heard.    No friction rub. No gallop.  Pulmonary:     Effort: Pulmonary effort is normal. No respiratory distress.     Breath sounds: Normal breath sounds. No wheezing, rhonchi or rales.  Abdominal:     General: Bowel sounds are normal. There is no distension.     Palpations: Abdomen is soft. There is no mass.     Tenderness: There is no abdominal  tenderness.  Musculoskeletal:        General: No swelling.     Right lower leg: No edema.     Left lower leg: No edema.  Lymphadenopathy:     Cervical: No cervical adenopathy.     Upper Body:     Right upper body: No supraclavicular or axillary adenopathy.     Left upper body: No supraclavicular or axillary adenopathy.     Lower Body: No right inguinal adenopathy. No left inguinal adenopathy.  Skin:    General: Skin is warm.     Coloration: Skin is not jaundiced.     Findings: No lesion or rash.  Neurological:     General: No focal deficit present.     Mental Status: She is alert and oriented to person, place, and time. Mental status is at baseline.  Psychiatric:        Mood and Affect: Mood normal.        Behavior: Behavior normal.        Thought Content: Thought content normal.    LABS:    Latest Reference Range & Units 09/04/22 00:00  WBC  8.7 (E)  RBC 3.87 - 5.11  4.73 (E)  Hemoglobin 12.0 - 16.0  14.4 (E)  HCT 36 - 46  43 (E)  Platelets 150 - 400 K/uL 456 ! (E)  NEUT#  5.83 (E)  !:  Data is abnormal (E): External lab result  ASSESSMENT & PLAN:  Assessment/Plan:  A 81 y.o. female with essential thrombocythemia, as well as a history iron deficiency anemia.  When evaluating her labs today, I am pleased as her platelets remain well below 600.  She will continue to take hydroxyurea 500 mg every Monday, Wednesday, and Friday to keep her platelets from precipitously rising. Of note, she is on Eliquis due to her history of atrial fibrillation. This agent should also protect her from clotting complications related to her underlying essential thrombocythemia.  Clinically, the patient appears to be doing well.  As that is the case, I will see her back in 6 months for repeat clinical assessment.  The patient understands all the plans discussed today and is in agreement with them.    Nayshawn Mesta Macarthur Critchley, MD

## 2022-09-04 ENCOUNTER — Telehealth: Payer: Self-pay | Admitting: Oncology

## 2022-09-04 ENCOUNTER — Inpatient Hospital Stay: Payer: PPO | Attending: Oncology | Admitting: Oncology

## 2022-09-04 ENCOUNTER — Inpatient Hospital Stay: Payer: PPO

## 2022-09-04 ENCOUNTER — Other Ambulatory Visit: Payer: Self-pay | Admitting: Oncology

## 2022-09-04 VITALS — BP 176/80 | HR 81 | Temp 98.9°F | Resp 16 | Ht 67.0 in | Wt 187.2 lb

## 2022-09-04 DIAGNOSIS — Z7901 Long term (current) use of anticoagulants: Secondary | ICD-10-CM | POA: Diagnosis not present

## 2022-09-04 DIAGNOSIS — D509 Iron deficiency anemia, unspecified: Secondary | ICD-10-CM

## 2022-09-04 DIAGNOSIS — D649 Anemia, unspecified: Secondary | ICD-10-CM | POA: Diagnosis not present

## 2022-09-04 DIAGNOSIS — D473 Essential (hemorrhagic) thrombocythemia: Secondary | ICD-10-CM | POA: Diagnosis not present

## 2022-09-04 DIAGNOSIS — D75839 Thrombocytosis, unspecified: Secondary | ICD-10-CM | POA: Diagnosis not present

## 2022-09-04 DIAGNOSIS — I4891 Unspecified atrial fibrillation: Secondary | ICD-10-CM | POA: Insufficient documentation

## 2022-09-04 LAB — CMP (CANCER CENTER ONLY)
ALT: 12 U/L (ref 0–44)
AST: 19 U/L (ref 15–41)
Albumin: 3.7 g/dL (ref 3.5–5.0)
Alkaline Phosphatase: 61 U/L (ref 38–126)
Anion gap: 5 (ref 5–15)
BUN: 22 mg/dL (ref 8–23)
CO2: 25 mmol/L (ref 22–32)
Calcium: 9 mg/dL (ref 8.9–10.3)
Chloride: 108 mmol/L (ref 98–111)
Creatinine: 1.43 mg/dL — ABNORMAL HIGH (ref 0.44–1.00)
GFR, Estimated: 37 mL/min — ABNORMAL LOW (ref >60)
Glucose, Bld: 117 mg/dL — ABNORMAL HIGH (ref 70–99)
Potassium: 4.1 mmol/L (ref 3.5–5.1)
Sodium: 138 mmol/L (ref 135–145)
Total Bilirubin: 0.6 mg/dL (ref 0.3–1.2)
Total Protein: 7.4 g/dL (ref 6.5–8.1)

## 2022-09-04 LAB — CBC AND DIFFERENTIAL
HCT: 43 (ref 36–46)
Hemoglobin: 14.4 (ref 12.0–16.0)
Neutrophils Absolute: 5.83
Platelets: 456 10*3/uL — AB (ref 150–400)
WBC: 8.7

## 2022-09-04 LAB — CBC: RBC: 4.73 (ref 3.87–5.11)

## 2022-09-04 NOTE — Telephone Encounter (Signed)
Patient has been scheduled for follow-up visit per 09/04/22 los. Pt given an appt calendar with date and time.  

## 2022-10-10 DIAGNOSIS — Z4689 Encounter for fitting and adjustment of other specified devices: Secondary | ICD-10-CM | POA: Diagnosis not present

## 2022-11-08 ENCOUNTER — Other Ambulatory Visit: Payer: Self-pay | Admitting: Cardiology

## 2023-01-01 DIAGNOSIS — L219 Seborrheic dermatitis, unspecified: Secondary | ICD-10-CM | POA: Diagnosis not present

## 2023-01-13 ENCOUNTER — Other Ambulatory Visit: Payer: Self-pay | Admitting: Cardiology

## 2023-01-13 DIAGNOSIS — I480 Paroxysmal atrial fibrillation: Secondary | ICD-10-CM

## 2023-01-13 NOTE — Telephone Encounter (Signed)
Prescription refill request for Eliquis received.  Indication: afib  Last office visit: Agustin Cree. 08/01/2022 Scr: 1.43, 09/04/2022 Age: 82 yo  Weight: 84.9 kg   Refill sent.

## 2023-01-20 DIAGNOSIS — Z905 Acquired absence of kidney: Secondary | ICD-10-CM | POA: Diagnosis not present

## 2023-01-20 DIAGNOSIS — N3020 Other chronic cystitis without hematuria: Secondary | ICD-10-CM | POA: Diagnosis not present

## 2023-01-20 DIAGNOSIS — N202 Calculus of kidney with calculus of ureter: Secondary | ICD-10-CM | POA: Diagnosis not present

## 2023-02-04 DIAGNOSIS — Z4689 Encounter for fitting and adjustment of other specified devices: Secondary | ICD-10-CM | POA: Diagnosis not present

## 2023-02-23 ENCOUNTER — Ambulatory Visit: Payer: PPO | Attending: Cardiology | Admitting: Cardiology

## 2023-02-23 ENCOUNTER — Encounter: Payer: Self-pay | Admitting: Cardiology

## 2023-02-23 VITALS — BP 110/68 | HR 93 | Ht 67.0 in | Wt 185.0 lb

## 2023-02-23 DIAGNOSIS — I480 Paroxysmal atrial fibrillation: Secondary | ICD-10-CM

## 2023-02-23 DIAGNOSIS — R002 Palpitations: Secondary | ICD-10-CM

## 2023-02-23 DIAGNOSIS — I5032 Chronic diastolic (congestive) heart failure: Secondary | ICD-10-CM | POA: Diagnosis not present

## 2023-02-23 DIAGNOSIS — E785 Hyperlipidemia, unspecified: Secondary | ICD-10-CM | POA: Diagnosis not present

## 2023-02-23 NOTE — Patient Instructions (Signed)

## 2023-02-23 NOTE — Progress Notes (Unsigned)
Cardiology Office Note:    Date:  02/23/2023   ID:  [name] [name], DOB [birth year], MRN [id number]  PCP:  [name] Ponto, MD  Cardiologist:  [name] Balsam, MD    Referring MD: [name] Ponto, MD   Chief Complaint  Patient presents with   Follow-up    History of Present Illness:    [name] [name] is a 82 y.o. female   with past medical history significant for paroxysmal atrial fibrillation being successfully suppressed with flecainide seems to maintain sinus rhythm, she is anticoagulated, recently she was discovered to have frequent ventricular ectopy we will try to manage this with increasing dosages of beta-blocker but that can have make her feel worse therefore the dose was decreased.  Eventually she was seen by our EP team and they recommended increased dose of flecainide to 100 mg twice daily suggest that since that time palpitations seems to be improving.  Comes today to months for follow-up.  Overall she is doing very well.  She denies of any chest pain tightness squeezing pressure mentions that she looks good  Past Medical History:  Diagnosis Date   Abdominal aortic atherosclerosis (HCC) 05/07/2016   Noted on CT abd/pelvis   Acute renal failure (HCC) 06/04/2013   Acute respiratory failure with hypoxia (HCC) 06/04/2013   Anemia, unspecified 06/08/2013   Anticoagulant long-term use    ELIQUIS   Cancer (HCC)    uterine cancer    Chronic anticoagulation 08/04/2017   Chronic diastolic (congestive) heart failure (HCC)     pt denies    Chronic diastolic congestive heart failure (HCC) 05/31/2015   Chronic kidney disease 2014   arf on dialysis in icu   Complication of anesthesia    2021- kidney removed at [facility] pt reports did not get enough med to wake her up and she could not talk and then recovered after given more of wake up med from anesthesia    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 Senaida Ores  [facility] in [place])  pt uses pessary   Diarrhea 06/11/2013   Diverticulosis 05/07/2016   Noted on CT abd/pelvis   Dyslipidemia 05/31/2015   E coli bacteremia 06/08/2013   Encounter for monitoring flecainide therapy 07/30/2016   Essential thrombocythemia (HCC) 03/04/2021   Female genital prolapse 04/15/2016   First degree heart block    General weakness    GERD (gastroesophageal reflux disease)    Hematuria    intermittently due to ureter right stent   Hiatal hernia    High risk medication use 04/15/2016   History of arteriovenostomy for renal dialysis [facility])    History of colon polyps    History of gastric polyp    History of kidney infection    12-31-2018  perinephrtic abscess  right side  s/p drains x2 01-10-2019,  05/ 2020 drains removed   History of kidney stones    History of peptic ulcer disease    History of septic shock    2011 (pt unaware) and 06-04-2013  w/ acute renal failure   History of uterine cancer    STAGE I  --  S/P TAH W/ BSO  (NO OTHER TX)   History of ventilator dependency (HCC) 2014   in setting of septic shock   Hydronephrosis 05/07/2016   Hyperlipemia 04/15/2016   Hyperlipidemia    Hypertension    Iron deficiency anemia hematology/ oncology--- dr  lewis at [facility] in [place]--- per lov note 08-07-2017  stabilized and felt to be more chronic anemia   01/ 2018  dx iron def. anemia  s/p  IV Iron infusion and taking oral iron supplement   Lactic acidosis 06/04/2013   Lumbar compression fracture (HCC)    01-22-2019 per imaging L1  (06-01-2019 per pt currently no pain)   Midline cystocele 04/15/2016   Nonfunctioning kidney 03/23/2020   NSTEMI, initial episode of care [facility]) 06/07/2013   pt denies    Occlusion of ureteral stent (HCC)    PAF (paroxysmal atrial fibrillation) (HCC) DX JUNE 2012   CARDIOLOGIST--  DR Claire Shown--- [facility])  CHADS2   Palpitations 04/15/2016   Paroxysmal atrial fibrillation (HCC)  08/04/2017   Presence of pessary    Pyelonephritis 06/04/2013   Right wrist fracture    per pt fractured on 03-10-2019,  had closed reduction and wearing a brace   Sepsis (HCC) 05/07/2016   Septic shock(785.52) 06/04/2013   Tendonitis, Achilles 04/15/2016   Ureteral obstruction, right 06/04/2013   Ureteral stricture, right urologist-- dr Berneice Heinrich   Chronic--- treated with ureteral stent   Urinary tract infection without hematuria    Uses walker    and wheelchair for longer distance   Ventricular ectopy 12/28/2020   Wears glasses     Past Surgical History:  Procedure Laterality Date   APPENDECTOMY     BACK SURGERY     CARDIOVERSION  09/ 2018   dr Bing Matter   successful ([facility] )   CHOLECYSTECTOMY  2010   COLONOSCOPY W/ POLYPECTOMY  05/2017   CYSTO/  BALLOON DILATION RIGHT URETERAL STRICTURE/ STENT PLACEMENT  06-02-2013   CYSTO/  BILATERAL RETROGRADE PYELOGRAM/ RIGHT URETEROSCOPY AND STENT PLACEMENT  10-04-2005   CYSTO/ LEFT URETEROSCOPIC STONE EXTRACTION  04-03-2006   CYSTOSCOPY W/ RETROGRADES Right 01/03/2019   Procedure: CYSTOSCOPY WITH RETROGRADE PYELOGRAM RIGHT WITH STENT EXCHANGE;  Surgeon: Bjorn Pippin, MD;  Location: [facility];  Service: Urology;  Laterality: Right;   CYSTOSCOPY W/ URETERAL STENT PLACEMENT Right 02/06/2014   Procedure: CYSTOSCOPY WITH RIGHT RETROGRADE PYELOGRAM RIGHT STENT REMOVAL and REPLACEMENT, Right Ureteroscopy;  Surgeon: Kathi Ludwig, MD;  Location: [facility];  Service: Urology;  Laterality: Right;   CYSTOSCOPY W/ URETERAL STENT PLACEMENT Right 11/26/2016   Procedure: CYSTOSCOPY WITH RETROGRADE PYELOGRAM/URETERAL STENT REPLACEMENT;  Surgeon: Sebastian Ache, MD;  Location: [facility];  Service: Urology;  Laterality: Right;   CYSTOSCOPY W/ URETERAL STENT PLACEMENT Right 04/24/2017   Procedure: CYSTOSCOPY WITH RETROGRADE PYELOGRAM/URETERAL STENT REPLACEMENT;  Surgeon: Sebastian Ache, MD;  Location: [facility];  Service:  Urology;  Laterality: Right;   CYSTOSCOPY W/ URETERAL STENT PLACEMENT Right 10/07/2017   Procedure: CYSTOSCOPY WITH RETROGRADE PYELOGRAM/URETERAL STENT EXCHANGE;  Surgeon: Sebastian Ache, MD;  Location: [facility];  Service: Urology;  Laterality: Right;   CYSTOSCOPY W/ URETERAL STENT PLACEMENT Right 03/03/2018   Procedure: CYSTOSCOPY WITH RIGHT RETROGRADE / RIGHT URETERAL STENT EXCHANGE;  Surgeon: Sebastian Ache, MD;  Location: [facility];  Service: Urology;  Laterality: Right;   CYSTOSCOPY W/ URETERAL STENT PLACEMENT Right 09/22/2018   Procedure: CYSTOSCOPY WITH RETROGRADE PYELOGRAM/URETERAL STENT PLACEMENT;  Surgeon: Sebastian Ache, MD;  Location: [facility];  Service: Urology;  Laterality: Right;  45 MINS   CYSTOSCOPY W/ URETERAL STENT PLACEMENT Right 06/03/2019   Procedure: CYSTOSCOPY WITH RETROGRADE PYELOGRAM/URETERAL STENT REPLACEMENT;  Surgeon: Sebastian Ache, MD;  Location: [facility];  Service:  Urology;  Laterality: Right;   CYSTOSCOPY W/ URETERAL STENT PLACEMENT Right 11/23/2019   Procedure: CYSTOSCOPY WITH RETROGRADE PYELOGRAM/URETERAL STENT PLACEMENT;  Surgeon: Sebastian Ache, MD;  Location: [facility];  Service: Urology;  Laterality: Right;  45 MINS   CYSTOSCOPY WITH LITHOLAPAXY N/A 11/21/2013   Procedure: CYSTOSCOPY WITH LITHOLAPAXY;  Surgeon: Kathi Ludwig, MD;  Location: [facility];  Service: Urology;  Laterality: N/A;   CYSTOSCOPY WITH RETROGRADE PYELOGRAM, URETEROSCOPY AND STENT PLACEMENT Bilateral 03/15/2014   Procedure: CYSTOSCOPY WITH BILATERAL RETROGRADE PYELOGRAM, RIGHT DIAGNOSTIC URETEROSCOPY AND STENT EXCHANGE;  Surgeon: Sebastian Ache, MD;  Location: [facility];  Service: Urology;  Laterality: Bilateral;   CYSTOSCOPY WITH RETROGRADE PYELOGRAM, URETEROSCOPY AND STENT PLACEMENT Left 03/13/2021   Procedure: FIRST STAGE: CYSTOSCOPY WITH RETROGRADE PYELOGRAM, URETEROSCOPY AND STENT PLACEMENT;  Surgeon:  Sebastian Ache, MD;  Location: [facility];  Service: Urology;  Laterality: Left;  75 MINS   CYSTOSCOPY WITH RETROGRADE PYELOGRAM, URETEROSCOPY AND STENT PLACEMENT Left 03/29/2021   Procedure: SECOND STGAE:CYSTOSCOPY WITH RETROGRADE PYELOGRAM, URETEROSCOPY AND STENT EXCHANGE;  Surgeon: Sebastian Ache, MD;  Location: [facility];  Service: Urology;  Laterality: Left;   CYSTOSCOPY WITH STENT PLACEMENT Right 05/07/2016   Procedure: CYSTOSCOPY WITH RETROGRADE PYELOGRAM, URETERAL STENT PLACEMENT;  Surgeon: Marcine Matar, MD;  Location: [facility];  Service: Urology;  Laterality: Right;   EYE SURGERY     Bilateral cataract removal   HEMIARTHROPLASTY HIP Right 11/2016   fractured   HOLMIUM LASER APPLICATION Left 03/13/2021   Procedure: HOLMIUM LASER APPLICATION;  Surgeon: Sebastian Ache, MD;  Location: [facility];  Service: Urology;  Laterality: Left;   IR CATHETER TUBE CHANGE  02/04/2019   IR CATHETER TUBE CHANGE  02/04/2019   IR CATHETER TUBE CHANGE  02/11/2019   IR RADIOLOGIST EVAL & MGMT  03/09/2019   JOINT REPLACEMENT     LUMBAR FUSION  JULY 2013   NEPHROLITHOTOMY  X2 YRS AGO   PERCUTANEOUS NEPHROSTOMY  12/2018   ROBOT ASSISTED LAPAROSCOPIC NEPHRECTOMY Right 03/23/2020   Procedure: XI ROBOTIC ASSISTED LAPAROSCOPIC NEPHRECTOMY;  Surgeon: Sebastian Ache, MD;  Location: [facility];  Service: Urology;  Laterality: Right;   TOTAL ABDOMINAL HYSTERECTOMY W/ BILATERAL SALPINGOOPHORECTOMY  1989   TOTAL KNEE ARTHROPLASTY Bilateral 1992  &  1996   TRANSTHORACIC ECHOCARDIOGRAM  07-24-2017   dr Bing Matter   ef 60-65% (improved from last echo 2014, was 45-50%)/  trace TR/  mild AV sclerosis without stenosis   URETEROSCOPY Right 11/21/2013   Procedure: URETEROSCOPY WITH STENT REMOVAL AND REPLACEMENT;  Surgeon: Kathi Ludwig, MD;  Location: [facility];  Service: Urology;  Laterality: Right;    Current Medications: Current Meds  Medication Sig   acetaminophen (TYLENOL) 500 MG tablet Take 1,000 mg by mouth  every 6 (six) hours as needed for moderate pain or headache.    amitriptyline (ELAVIL) 25 MG tablet Take 25 mg by mouth at bedtime.   ELIQUIS 5 MG TABS tablet Take 1 tablet (5 mg total) by mouth 2 (two) times daily.   flecainide (TAMBOCOR) 100 MG tablet Take 1 tablet (100 mg total) by mouth 2 (two) times daily.   hydroxyurea (HYDREA) 500 MG capsule TAKE ONE CAPSULE BY MOUTH every monday, WEDNESDAY & FRIDAY for elevated platelets (Patient taking differently: Take 500 mg by mouth 3 (three) times a week.)   omeprazole (PRILOSEC) 40 MG capsule Take 40 mg by mouth daily.    ondansetron (ZOFRAN-ODT) 4 MG disintegrating tablet Take 4 mg by mouth every 6 (  six) hours as needed for nausea/vomiting.   promethazine (PHENERGAN) 25 MG tablet Take 25 mg by mouth every 8 (eight) hours as needed for nausea or vomiting.   triamcinolone cream (KENALOG) 0.1 % Apply 1 application topically 2 (two) times daily as needed (eczema).     Allergies:   Codeine and Hydrocodone   Social History   Socioeconomic History   Marital status: Married    Spouse name: Not on file   Number of children: Not on file   Years of education: Not on file   Highest education level: Not on file  Occupational History   Occupation: Retired from [employer]  Tobacco Use   Smoking status: Never   Smokeless tobacco: Never  Vaping Use   Vaping Use: Never used  Substance and Sexual Activity   Alcohol use: No   Drug use: No   Sexual activity: Not on file  Other Topics Concern   Not on file  Social History Narrative   Lives in [place] with husband. Drives, walks some, but not very active at home.    Social Determinants of Health   Financial Resource Strain: Not on file  Food Insecurity: Not on file  Transportation Needs: Not on file  Physical Activity: Not on file  Stress: Not on file  Social Connections: Not on file     Family History: The patient's family history includes Cancer in her mother; Heart disease in her father and  mother; Heart failure in her father and mother. ROS:   Please see the history of present illness.    All 14 point review of systems negative except as described per history of present illness  EKGs/Labs/Other Studies Reviewed:      Recent Labs: 09/04/2022: ALT 12; BUN 22; Creatinine 1.43; Hemoglobin 14.4; Platelets 456; Potassium 4.1; Sodium 138  Recent Lipid Panel    Component Value Date/Time   CHOL 179 08/04/2022 0847   TRIG 157 (H) 08/04/2022 0847   HDL 53 08/04/2022 0847   CHOLHDL 3.4 08/04/2022 0847   LDLCALC 99 08/04/2022 0847    Physical Exam:    VS:  BP 110/68 (BP Location: Left Arm, Patient Position: Sitting)   Pulse 93   Ht 5\' 7"  (1.702 m)   Wt 185 lb (83.9 kg)   SpO2 95%   BMI 28.98 kg/m     Wt Readings from Last 3 Encounters:  02/23/23 185 lb (83.9 kg)  09/04/22 187 lb 3.2 oz (84.9 kg)  08/01/22 186 lb 6.4 oz (84.6 kg)     GEN:  Well nourished, well developed in no acute distress HEENT: Normal NECK: No JVD; No carotid bruits LYMPHATICS: No lymphadenopathy CARDIAC: RRR, no murmurs, no rubs, no gallops RESPIRATORY:  Clear to auscultation without rales, wheezing or rhonchi  ABDOMEN: Soft, non-tender, non-distended MUSCULOSKELETAL:  No edema; No deformity  SKIN: Warm and dry LOWER EXTREMITIES: no swelling NEUROLOGIC:  Alert and oriented x 3 PSYCHIATRIC:  Normal affect   ASSESSMENT:    1. Paroxysmal atrial fibrillation (HCC)   2. Chronic diastolic (congestive) heart failure (HCC)   3. Dyslipidemia   4. Palpitations    PLAN:    In order of problems listed above:  Paroxysmal atrial fibrillation maintaining sinus rhythm based on EKG today.  Will continue anticoagulation. Chronic diastolic congestive heart failure compensated looking good continue present management. Dyslipidemia I did review K PN which show me her LDL of 99 HDL 53.  Will continue present management.   Medication Adjustments/Labs and Tests Ordered: Current medicines  are reviewed at  length with the patient today.  Concerns regarding medicines are outlined above.  Orders Placed This Encounter  Procedures   EKG 12-Lead   Medication changes: No orders of the defined types were placed in this encounter.   Signed, Georgeanna Lea, MD, [facility] 02/23/2023 1:54 PM    [facility]

## 2023-03-04 NOTE — Progress Notes (Unsigned)
[facility]  [address] [place],  [place]  [address] [phone]  Clinic Day:  03/05/2023  Referring physician: Marylen Ponto, MD  HISTORY OF PRESENT ILLNESS:  The patient is an 82 y.o. female with essential thrombocythemia, which is based upon her having an elevated platelet count and testing positive for the JAK2 mutation.  She also has a history of iron deficiency anemia.  She takes hydroxyurea 500 mg every Monday, Wednesday and Friday to keep her platelet count under 400-600.  She comes in today to reassess her peripheral counts.  Since her last visit, the patient has been doing fairly well.  She denies having any side effects from her hydroxyurea.  She also denies having any clotting complications from her underlying essential thrombocythemia.   PHYSICAL EXAM:  Blood pressure (!) 146/70, pulse 76, temperature 98.3 F (36.8 C), temperature source Oral, resp. rate 18, height 5\' 7"  (1.702 m), weight 184 lb 9.6 oz (83.7 kg), SpO2 95 %. Wt Readings from Last 3 Encounters:  03/05/23 184 lb 9.6 oz (83.7 kg)  02/23/23 185 lb (83.9 kg)  09/04/22 187 lb 3.2 oz (84.9 kg)   Body mass index is 28.91 kg/m. Performance status (ECOG): 1 Physical Exam Constitutional:      Appearance: Normal appearance. She is not ill-appearing.     Comments: She is ambulating with a walker  HENT:     Mouth/Throat:     Mouth: Mucous membranes are moist.     Pharynx: Oropharynx is clear. No oropharyngeal exudate or posterior oropharyngeal erythema.  Cardiovascular:     Rate and Rhythm: Normal rate and regular rhythm.     Heart sounds: No murmur heard.    No friction rub. No gallop.  Pulmonary:     Effort: Pulmonary effort is normal. No respiratory distress.     Breath sounds: Normal breath sounds. No wheezing, rhonchi or rales.  Abdominal:     General: Bowel sounds are normal. There is no distension.     Palpations: Abdomen is soft. There is no mass.     Tenderness:  There is no abdominal tenderness.  Musculoskeletal:        General: No swelling.     Right lower leg: No edema.     Left lower leg: No edema.  Lymphadenopathy:     Cervical: No cervical adenopathy.     Upper Body:     Right upper body: No supraclavicular or axillary adenopathy.     Left upper body: No supraclavicular or axillary adenopathy.     Lower Body: No right inguinal adenopathy. No left inguinal adenopathy.  Skin:    General: Skin is warm.     Coloration: Skin is not jaundiced.     Findings: No lesion or rash.  Neurological:     General: No focal deficit present.     Mental Status: She is alert and oriented to person, place, and time. Mental status is at baseline.  Psychiatric:        Mood and Affect: Mood normal.        Behavior: Behavior normal.        Thought Content: Thought content normal.    LABS:    Latest Reference Range & Units 03/05/23 00:00  WBC  8.8 (E)  RBC 3.87 - 5.11  4.73 (E)  Hemoglobin 12.0 - 16.0  14.3 (E)  HCT 36 - 46  43 (E)  Platelets 150 - 400 K/uL 445 ! (E)  !: Data  is abnormal (E): External lab result  ASSESSMENT & PLAN:  Assessment/Plan:  An 82 y.o. female with essential thrombocythemia, as well as a history iron deficiency anemia.  When evaluating her labs today, I am pleased as her platelets remain well below 600.  She will continue to take hydroxyurea 500 mg every Monday, Wednesday, and Friday to keep her platelets from precipitously rising. Of note, she is on Eliquis due to her history of atrial fibrillation. This agent should also protect her from clotting complications related to her underlying essential thrombocythemia.  Clinically, the patient appears to be doing well.  I will see her back in 6 months for repeat clinical assessment.  The patient understands all the plans discussed today and is in agreement with them.    Tvisha Schwoerer Kirby Funk, MD

## 2023-03-05 ENCOUNTER — Encounter: Payer: Self-pay | Admitting: Oncology

## 2023-03-05 ENCOUNTER — Inpatient Hospital Stay: Payer: PPO | Attending: Oncology | Admitting: Oncology

## 2023-03-05 ENCOUNTER — Other Ambulatory Visit: Payer: Self-pay | Admitting: Oncology

## 2023-03-05 ENCOUNTER — Inpatient Hospital Stay: Payer: PPO

## 2023-03-05 VITALS — BP 146/70 | HR 76 | Temp 98.3°F | Resp 18 | Ht 67.0 in | Wt 184.6 lb

## 2023-03-05 DIAGNOSIS — D473 Essential (hemorrhagic) thrombocythemia: Secondary | ICD-10-CM | POA: Diagnosis not present

## 2023-03-05 DIAGNOSIS — D649 Anemia, unspecified: Secondary | ICD-10-CM | POA: Diagnosis not present

## 2023-03-05 LAB — CBC: RBC: 4.73 (ref 3.87–5.11)

## 2023-03-05 LAB — CBC AND DIFFERENTIAL
HCT: 43 (ref 36–46)
Hemoglobin: 14.3 (ref 12.0–16.0)
Neutrophils Absolute: 5.98
Platelets: 445 10*3/uL — AB (ref 150–400)
WBC: 8.8

## 2023-03-25 DIAGNOSIS — H35341 Macular cyst, hole, or pseudohole, right eye: Secondary | ICD-10-CM | POA: Diagnosis not present

## 2023-03-25 DIAGNOSIS — H524 Presbyopia: Secondary | ICD-10-CM | POA: Diagnosis not present

## 2023-04-26 ENCOUNTER — Other Ambulatory Visit: Payer: Self-pay | Admitting: Hematology and Oncology

## 2023-04-26 DIAGNOSIS — D693 Immune thrombocytopenic purpura: Secondary | ICD-10-CM

## 2023-05-07 DIAGNOSIS — Z1331 Encounter for screening for depression: Secondary | ICD-10-CM | POA: Diagnosis not present

## 2023-05-07 DIAGNOSIS — M8580 Other specified disorders of bone density and structure, unspecified site: Secondary | ICD-10-CM | POA: Diagnosis not present

## 2023-05-07 DIAGNOSIS — I4891 Unspecified atrial fibrillation: Secondary | ICD-10-CM | POA: Diagnosis not present

## 2023-05-07 DIAGNOSIS — G43109 Migraine with aura, not intractable, without status migrainosus: Secondary | ICD-10-CM | POA: Diagnosis not present

## 2023-05-07 DIAGNOSIS — K219 Gastro-esophageal reflux disease without esophagitis: Secondary | ICD-10-CM | POA: Diagnosis not present

## 2023-05-07 DIAGNOSIS — Z0000 Encounter for general adult medical examination without abnormal findings: Secondary | ICD-10-CM | POA: Diagnosis not present

## 2023-05-07 DIAGNOSIS — Z1339 Encounter for screening examination for other mental health and behavioral disorders: Secondary | ICD-10-CM | POA: Diagnosis not present

## 2023-05-07 DIAGNOSIS — Z79899 Other long term (current) drug therapy: Secondary | ICD-10-CM | POA: Diagnosis not present

## 2023-05-07 DIAGNOSIS — Z6831 Body mass index (BMI) 31.0-31.9, adult: Secondary | ICD-10-CM | POA: Diagnosis not present

## 2023-05-07 DIAGNOSIS — E7800 Pure hypercholesterolemia, unspecified: Secondary | ICD-10-CM | POA: Diagnosis not present

## 2023-07-08 ENCOUNTER — Other Ambulatory Visit: Payer: Self-pay | Admitting: Cardiology

## 2023-07-08 DIAGNOSIS — I480 Paroxysmal atrial fibrillation: Secondary | ICD-10-CM

## 2023-07-08 NOTE — Telephone Encounter (Addendum)
Prescription refill request for Eliquis received. Indication: Afib  Last office visit: 02/23/23 [name] [name])  Scr: 1.43 (09/04/22)  Age: 82 Weight: 83.7kg  Appropriate dose. Refill sent.

## 2023-07-13 DIAGNOSIS — Z4689 Encounter for fitting and adjustment of other specified devices: Secondary | ICD-10-CM | POA: Diagnosis not present

## 2023-07-21 DIAGNOSIS — Z905 Acquired absence of kidney: Secondary | ICD-10-CM | POA: Diagnosis not present

## 2023-07-21 DIAGNOSIS — N202 Calculus of kidney with calculus of ureter: Secondary | ICD-10-CM | POA: Diagnosis not present

## 2023-07-21 DIAGNOSIS — N3020 Other chronic cystitis without hematuria: Secondary | ICD-10-CM | POA: Diagnosis not present

## 2023-08-01 ENCOUNTER — Other Ambulatory Visit: Payer: Self-pay | Admitting: Cardiology

## 2023-08-24 NOTE — Progress Notes (Signed)
 Cancelled visit. Closing erroneous encounter. No charge.   Caitlin S Walker, NP

## 2023-08-25 ENCOUNTER — Ambulatory Visit: Payer: PPO | Admitting: Cardiology

## 2023-08-28 ENCOUNTER — Encounter: Payer: Self-pay | Admitting: Cardiology

## 2023-08-28 ENCOUNTER — Ambulatory Visit: Payer: PPO | Attending: Cardiology | Admitting: Cardiology

## 2023-08-28 VITALS — BP 133/72 | HR 77 | Ht 67.0 in | Wt 183.0 lb

## 2023-08-28 DIAGNOSIS — R002 Palpitations: Secondary | ICD-10-CM | POA: Diagnosis not present

## 2023-08-28 DIAGNOSIS — I2510 Atherosclerotic heart disease of native coronary artery without angina pectoris: Secondary | ICD-10-CM | POA: Diagnosis not present

## 2023-08-28 DIAGNOSIS — I480 Paroxysmal atrial fibrillation: Secondary | ICD-10-CM | POA: Diagnosis not present

## 2023-08-28 DIAGNOSIS — I10 Essential (primary) hypertension: Secondary | ICD-10-CM

## 2023-08-28 DIAGNOSIS — Z79899 Other long term (current) drug therapy: Secondary | ICD-10-CM | POA: Diagnosis not present

## 2023-08-28 DIAGNOSIS — R1031 Right lower quadrant pain: Secondary | ICD-10-CM

## 2023-08-28 NOTE — Progress Notes (Signed)
Cardiology Office Note:  .   Date:  08/28/2023  ID:  [name] [name], DOB [birth year], MRN [id number] PCP: Marylen Ponto, MD  [facility] Providers Cardiologist:  Gypsy Balsam, MD    History of Present Illness: .   [name] [name] is a 82 y.o. female with a past medical history of CAD--type II non-STEMI in 2014, PAF, diastolic heart failure, first-degree AV block, PVCs, hypertension, history of UTI/hydronephrosis, right nephrectomy, thrombocythemia, dyslipidemia.  08/27/2022 echo EF 60 to 65%, mild concentric LVH, grade 1 DD, aortic dilatation at 30 mm, mild dilatation of the ascending aorta 38 mm 11/20/2020 monitor average heart rate 70 bpm, brief atrial runs, occasional PACs  Most recently evaluated by Dr. Bing Matter on 02/23/2023, she was maintaining sinus rhythm, she was noted to have frequent ventricular ectopy and her beta-blocker dose had been increased but this made her feel worse and was eventually decreased.  She had been evaluated by EP and they recommended increasing her dose of flecainide 100 mg twice daily and this seemed to improve her palpitations.  She presents today accompanied by her husband for follow-up of her atrial fibrillation.  She has been doing good from that perspective.  She is in a considerable amount of pain today.  She states that she is having a gallbladder attack, or at least that is what she feels it is based on her symptoms.  She began having right lower abdominal pain and right lateral side pain that at times has radiated to her shoulder blades, she is also had episodes of worsening GERD.  He has plans to wait to see her PCP on Monday for evaluation of this however discussed that she probably should proceed to an urgent care and emergency department for further evaluation.  She denies chest pain, palpitations, dyspnea, pnd, orthopnea, n, v, dizziness, syncope, edema, weight gain, or early satiety.   ROS: Review of Systems  Constitutional: Negative.    HENT: Negative.    Eyes: Negative.   Respiratory: Negative.    Cardiovascular: Negative.   Gastrointestinal:  Positive for heartburn.  Genitourinary:  Positive for flank pain.  Musculoskeletal:  Positive for back pain.  Skin: Negative.   Neurological: Negative.   Endo/Heme/Allergies: Negative.   Psychiatric/Behavioral: Negative.       Studies Reviewed: .        Cardiac Studies & Procedures       ECHOCARDIOGRAM  ECHOCARDIOGRAM COMPLETE 08/27/2022  Narrative ECHOCARDIOGRAM REPORT    Patient Name:   [name] [name] [facility] Date of Exam: 08/27/2022 Medical Rec #:  [id number]         Height:       67.0 in Accession #:    [id number]        Weight:       186.4 lb Date of Birth:  1941/06/26         BSA:          1.963 m Patient Age:    81 years          BP:           136/72 mmHg Patient Gender: F                 HR:           72 bpm. Exam Location:  [place]  Procedure: 2D Echo, Cardiac Doppler, Color Doppler and Strain Analysis  Indications:    Dyspnea on exertion [R06.09 (ICD-10-CM)]  History:        Patient  has prior history of Echocardiogram examinations, most recent 05/24/2021. CHF, CAD, Arrythmias:Atrial Fibrillation; Risk Factors:Dyslipidemia and Hypertension.  Sonographer:    Margreta Journey RDCS Referring Phys: [id number] ROBERT J KRASOWSKI  IMPRESSIONS   1. Left ventricular ejection fraction, by estimation, is 60 to 65%. The left ventricle has normal function. The left ventricle has no regional wall motion abnormalities. There is mild concentric left ventricular hypertrophy. Left ventricular diastolic parameters are consistent with Grade I diastolic dysfunction (impaired relaxation). The average left ventricular global longitudinal strain is -17.1 %. 2. Right ventricular systolic function is normal. The right ventricular size is normal. 3. The mitral valve is normal in structure. No evidence of mitral valve regurgitation. No evidence of mitral stenosis. 4. The aortic  valve is tricuspid. Aortic valve regurgitation is not visualized. No aortic stenosis is present. 5. Aortic dilated DTA 30 mm. There is mild dilatation of the ascending aorta, measuring 38 mm. 6. The inferior vena cava is normal in size with greater than 50% respiratory variability, suggesting right atrial pressure of 3 mmHg.  FINDINGS Left Ventricle: Left ventricular ejection fraction, by estimation, is 60 to 65%. The left ventricle has normal function. The left ventricle has no regional wall motion abnormalities. The average left ventricular global longitudinal strain is -17.1 %. The left ventricular internal cavity size was normal in size. There is mild concentric left ventricular hypertrophy. Left ventricular diastolic parameters are consistent with Grade I diastolic dysfunction (impaired relaxation). Normal left ventricular filling pressure.  Right Ventricle: The right ventricular size is normal. No increase in right ventricular wall thickness. Right ventricular systolic function is normal.  Left Atrium: Left atrial size was normal in size.  Right Atrium: Right atrial size was normal in size.  Pericardium: There is no evidence of pericardial effusion.  Mitral Valve: The mitral valve is normal in structure. No evidence of mitral valve regurgitation. No evidence of mitral valve stenosis.  Tricuspid Valve: The tricuspid valve is normal in structure. Tricuspid valve regurgitation is not demonstrated. No evidence of tricuspid stenosis.  Aortic Valve: The aortic valve is tricuspid. Aortic valve regurgitation is not visualized. No aortic stenosis is present.  Pulmonic Valve: The pulmonic valve was normal in structure. Pulmonic valve regurgitation is not visualized. No evidence of pulmonic stenosis.  Aorta: The aortic root is normal in size and structure and dilated DTA 30 mm. There is mild dilatation of the ascending aorta, measuring 38 mm.  Venous: The pulmonary veins were not well  visualized. The inferior vena cava is normal in size with greater than 50% respiratory variability, suggesting right atrial pressure of 3 mmHg.  IAS/Shunts: No atrial level shunt detected by color flow Doppler.   LEFT VENTRICLE PLAX 2D LVIDd:         4.20 cm   Diastology LVIDs:         2.80 cm   LV e' medial:    9.19 cm/s LV PW:         1.20 cm   LV E/e' medial:  6.2 LV IVS:        1.20 cm   LV e' lateral:   7.88 cm/s LVOT diam:     1.80 cm   LV E/e' lateral: 7.2 LV SV:         60 LV SV Index:   30        2D Longitudinal Strain LVOT Area:     2.54 cm  2D Strain GLS Avg:     -17.1 %   RIGHT  VENTRICLE            IVC RV Basal diam:  3.10 cm    IVC diam: 1.50 cm RV S prime:     9.46 cm/s TAPSE (M-mode): 2.1 cm  LEFT ATRIUM             Index        RIGHT ATRIUM           Index LA diam:        4.10 cm 2.09 cm/m   RA Area:     14.50 cm LA Vol (A2C):   43.8 ml 22.32 ml/m  RA Volume:   30.90 ml  15.74 ml/m LA Vol (A4C):   32.0 ml 16.30 ml/m LA Biplane Vol: 37.4 ml 19.06 ml/m AORTIC VALVE LVOT Vmax:   115.00 cm/s LVOT Vmean:  83.600 cm/s LVOT VTI:    0.235 m  AORTA Ao Root diam: 3.40 cm Ao Asc diam:  3.80 cm Ao Desc diam: 3.00 cm  MITRAL VALVE MV Area (PHT): 1.79 cm     SHUNTS MV Decel Time: 423 msec     Systemic VTI:  0.24 m MV E velocity: 56.90 cm/s   Systemic Diam: 1.80 cm MV A velocity: 107.00 cm/s MV E/A ratio:  0.53  Norman Herrlich MD Electronically signed by Norman Herrlich MD Signature Date/Time: 08/27/2022/12:24:16 PM    Final    MONITORS  LONG TERM MONITOR-LIVE TELEMETRY (3-14 DAYS) 12/04/2020  Narrative Iven Finn Botkin, DOB 02/01/1941, MRN [id number]  EVENT MONITOR REPORT:   Patient was monitored from 11/20/2020 to 11/27/2020. Indication:                    Paroxysmal atrial fibrillation Ordering physician: Dr. Gypsy Balsam MD Referring physician:  Dr. Gypsy Balsam MD   Baseline rhythm: Sinus  Minimum heart rate: 53 BPM.  Average heart rate:  70 BPM.  Maximal heart rate 108 BPM.  Atrial arrhythmia: Rare brief atrial runs.  Occasional PACs  Ventricular arrhythmia: Frequent PVCs ] 14.3%]  Conduction abnormality: None significant  Symptoms: Skipped beat and fluttering sensation   Conclusion: Mildly abnormal but overall unremarkable event monitor.  At times the patient had symptoms of palpitations and fluttering and did not correlate with any significant findings on the monitor.  At other times patient had PVCs and ventricular bigeminy when she experienced the symptoms. Interpreting  cardiologist: Garwin Brothers, MD Date: 12/04/2020 11:39 AM           Risk Assessment/Calculations:    CHA2DS2-VASc Score = 5   This indicates a 7.2% annual risk of stroke. The patient's score is based upon: CHF History: 1 HTN History: 1 Diabetes History: 0 Stroke History: 0 Vascular Disease History: 0 Age Score: 2 Gender Score: 1            Physical Exam:   VS:  BP 133/72 (BP Location: Left Arm, Patient Position: Sitting, Cuff Size: Normal)   Pulse 77   Ht 5\' 7"  (1.702 m)   Wt 183 lb (83 kg)   SpO2 97%   BMI 28.66 kg/m    Wt Readings from Last 3 Encounters:  08/28/23 183 lb (83 kg)  03/05/23 184 lb 9.6 oz (83.7 kg)  02/23/23 185 lb (83.9 kg)    GEN: Well nourished, well developed in no acute distress NECK: No JVD; No carotid bruits CARDIAC: RRR, no murmurs, rubs, gallops RESPIRATORY:  Clear to auscultation without rales, wheezing or rhonchi  ABDOMEN: Soft, non-tender, non-distended EXTREMITIES:  No edema; No deformity   ASSESSMENT AND PLAN: .   CAD -she apparently had a type II STEMI back in 2014, there are no records to review review to determine if there was any stents placed etc. Stable with no anginal symptoms. No indication for ischemic evaluation.   Palpitations -currently quiescent although she has noticed variations with her heart rate over the last few days that may be attributable to the amount of pain she has  in. PAF/hypercoagulable state/high risk medication use -she is maintaining sinus rhythm, CHA2DS2-VASc score of 5, continue Eliquis 5 mg twice daily--no indication for dose reduction, she denies hematochezia, hematuria, hemoptysis..  Continue flecainide 100 mg twice daily--QRS remains narrow.  Will check CBC, BMET. HTN -blood pressure is controlled today 133/72, currently not on any antihypertensive agents. Abdominal pain-this is been most bothersome for her for the last few days, she feels that is consistent with gallstones-the way she describes it could be an issue with her gallbladder.  Suggested that she go to an urgent care or emergency department for further evaluation.  She wants to try and see if she can wait till she sees her PCP on Monday however if she continues to be in this amount of pain I think she would be best served following up at an emergency department.       Dispo: Follow-up in 6 months, CBC, BMET.  Proceed to the emergency department or urgent care if your abdominal pain persists or worsens.  Signed, Flossie Dibble, NP

## 2023-08-28 NOTE — Patient Instructions (Signed)
Medication Instructions:  Your physician recommends that you continue on your current medications as directed. Please refer to the Current Medication list given to you today.  *If you need a refill on your cardiac medications before your next appointment, please call your pharmacy*   Lab Work: Your physician recommends that you return for lab work in: Today for CBC and BMP  If you have labs (blood work) drawn today and your tests are completely normal, you will receive your results only by: MyChart Message (if you have MyChart) OR A paper copy in the mail If you have any lab test that is abnormal or we need to change your treatment, we will call you to review the results.   Testing/Procedures: NONE   Follow-Up: At [facility], you and your health needs are our priority.  As part of our continuing mission to provide you with exceptional heart care, we have created designated Provider Care Teams.  These Care Teams include your primary Cardiologist (physician) and Advanced Practice Providers (APPs -  Physician Assistants and Nurse Practitioners) who all work together to provide you with the care you need, when you need it.  We recommend signing up for the patient portal called "MyChart".  Sign up information is provided on this After Visit Summary.  MyChart is used to connect with patients for Virtual Visits (Telemedicine).  Patients are able to view lab/test results, encounter notes, upcoming appointments, etc.  Non-urgent messages can be sent to your provider as well.   To learn more about what you can do with MyChart, go to [email or web address].    Your next appointment:   6 month(s)  Provider:   Gypsy Balsam, MD    Other Instructions

## 2023-08-29 LAB — BASIC METABOLIC PANEL WITH GFR
BUN/Creatinine Ratio: 17 (ref 12–28)
BUN: 25 mg/dL (ref 8–27)
CO2: 21 mmol/L (ref 20–29)
Calcium: 9.8 mg/dL (ref 8.7–10.3)
Chloride: 101 mmol/L (ref 96–106)
Creatinine, Ser: 1.5 mg/dL — ABNORMAL HIGH (ref 0.57–1.00)
Glucose: 96 mg/dL (ref 70–99)
Potassium: 4.5 mmol/L (ref 3.5–5.2)
Sodium: 138 mmol/L (ref 134–144)
eGFR: 35 mL/min/1.73 — ABNORMAL LOW

## 2023-08-29 LAB — CBC WITH DIFFERENTIAL/PLATELET
Basophils Absolute: 0.1 x10E3/uL (ref 0.0–0.2)
Basos: 1 %
EOS (ABSOLUTE): 0.2 x10E3/uL (ref 0.0–0.4)
Eos: 2 %
Hematocrit: 46.2 % (ref 34.0–46.6)
Hemoglobin: 14.9 g/dL (ref 11.1–15.9)
Immature Grans (Abs): 0 x10E3/uL (ref 0.0–0.1)
Immature Granulocytes: 0 %
Lymphocytes Absolute: 1.5 x10E3/uL (ref 0.7–3.1)
Lymphs: 17 %
MCH: 29.1 pg (ref 26.6–33.0)
MCHC: 32.3 g/dL (ref 31.5–35.7)
MCV: 90 fL (ref 79–97)
Monocytes Absolute: 1 x10E3/uL — ABNORMAL HIGH (ref 0.1–0.9)
Monocytes: 11 %
Neutrophils Absolute: 6.3 x10E3/uL (ref 1.4–7.0)
Neutrophils: 69 %
Platelets: 545 x10E3/uL — ABNORMAL HIGH (ref 150–450)
RBC: 5.12 x10E6/uL (ref 3.77–5.28)
RDW: 14 % (ref 11.7–15.4)
WBC: 9.1 x10E3/uL (ref 3.4–10.8)

## 2023-08-31 DIAGNOSIS — R1011 Right upper quadrant pain: Secondary | ICD-10-CM | POA: Diagnosis not present

## 2023-08-31 DIAGNOSIS — Z6830 Body mass index (BMI) 30.0-30.9, adult: Secondary | ICD-10-CM | POA: Diagnosis not present

## 2023-09-03 DIAGNOSIS — R1011 Right upper quadrant pain: Secondary | ICD-10-CM | POA: Diagnosis not present

## 2023-09-07 NOTE — Progress Notes (Unsigned)
[facility]  [address] [place],  [place]  [address] [phone]  Clinic Day:  09/08/2023  Referring physician: Marylen Ponto, MD  HISTORY OF PRESENT ILLNESS:  The patient is an 82 y.o. female with essential thrombocythemia, which is based upon her having an elevated platelet count and testing positive for the JAK2 mutation.  She also has a history of iron deficiency anemia.  She takes hydroxyurea 500 mg every Monday, Wednesday and Friday to keep her platelet count under 400-600.  She comes in today to reassess her peripheral counts.  Since her last visit, the patient has been doing fairly well.  She denies having any side effects from her hydroxyurea.  She also denies having any clotting complications from her underlying essential thrombocythemia.   PHYSICAL EXAM:  Blood pressure (!) 146/86, pulse 84, temperature 99.4 F (37.4 C), resp. rate 16, height 5\' 7"  (1.702 m), weight 182 lb (82.6 kg), SpO2 98%. Wt Readings from Last 3 Encounters:  09/08/23 182 lb (82.6 kg)  08/28/23 183 lb (83 kg)  03/05/23 184 lb 9.6 oz (83.7 kg)   Body mass index is 28.51 kg/m. Performance status (ECOG): 1 Physical Exam Constitutional:      Appearance: Normal appearance. She is not ill-appearing.     Comments: She is ambulating with a walker  HENT:     Mouth/Throat:     Mouth: Mucous membranes are moist.     Pharynx: Oropharynx is clear. No oropharyngeal exudate or posterior oropharyngeal erythema.  Cardiovascular:     Rate and Rhythm: Normal rate and regular rhythm.     Heart sounds: No murmur heard.    No friction rub. No gallop.  Pulmonary:     Effort: Pulmonary effort is normal. No respiratory distress.     Breath sounds: Normal breath sounds. No wheezing, rhonchi or rales.  Abdominal:     General: Bowel sounds are normal. There is no distension.     Palpations: Abdomen is soft. There is no mass.     Tenderness: There is no abdominal tenderness.   Musculoskeletal:        General: No swelling.     Right lower leg: No edema.     Left lower leg: No edema.  Lymphadenopathy:     Cervical: No cervical adenopathy.     Upper Body:     Right upper body: No supraclavicular or axillary adenopathy.     Left upper body: No supraclavicular or axillary adenopathy.     Lower Body: No right inguinal adenopathy. No left inguinal adenopathy.  Skin:    General: Skin is warm.     Coloration: Skin is not jaundiced.     Findings: No lesion or rash.  Neurological:     General: No focal deficit present.     Mental Status: She is alert and oriented to person, place, and time. Mental status is at baseline.  Psychiatric:        Mood and Affect: Mood normal.        Behavior: Behavior normal.        Thought Content: Thought content normal.    LABS:    Latest Reference Range & Units 09/08/23 10:00  WBC 4.0 - 10.5 K/uL 10.7 (H)  RBC 3.87 - 5.11 MIL/uL 5.18 (H)  Hemoglobin 12.0 - 15.0 g/dL 72.5 (H)  HCT 36.6 - 44.0 % 47.2 (H)  MCV 80.0 - 100.0 fL 91.1  MCH 26.0 - 34.0 pg 29.9  MCHC 30.0 -  36.0 g/dL 14.7  RDW 82.9 - 56.2 % 15.1  Platelets 150 - 400 K/uL 576 (H)  nRBC 0.0 - 0.2 % 0 /100 WBC 0.00 0  Neutrophils % 70  Lymphocytes % 17  Monocytes Relative % 9  Eosinophil % 2  Basophil % 1  Immature Granulocytes % 1  (H): Data is abnormally high  ASSESSMENT & PLAN:  Assessment/Plan:  An 82 y.o. female with essential thrombocythemia, as well as a history iron deficiency anemia.  When evaluating her labs today, I am pleased as her platelets remain well below 600.  She will continue to take hydroxyurea 500 mg every Monday, Wednesday, and Friday to keep her platelets from precipitously rising. Of note, she is on Eliquis due to her history of atrial fibrillation. This agent should also protect her from developing clotting complications related to her underlying essential thrombocythemia.  Clinically, the patient appears to be doing well.  I will see her  back in 4 months for repeat clinical assessment.  The patient understands all the plans discussed today and is in agreement with them.    Tayanna Talford Kirby Funk, MD

## 2023-09-08 ENCOUNTER — Inpatient Hospital Stay: Payer: PPO

## 2023-09-08 ENCOUNTER — Inpatient Hospital Stay: Payer: PPO | Attending: Oncology | Admitting: Oncology

## 2023-09-08 ENCOUNTER — Telehealth: Payer: Self-pay | Admitting: Oncology

## 2023-09-08 ENCOUNTER — Other Ambulatory Visit: Payer: Self-pay | Admitting: Oncology

## 2023-09-08 VITALS — BP 146/86 | HR 84 | Temp 99.4°F | Resp 16 | Ht 67.0 in | Wt 182.0 lb

## 2023-09-08 DIAGNOSIS — D473 Essential (hemorrhagic) thrombocythemia: Secondary | ICD-10-CM | POA: Insufficient documentation

## 2023-09-08 DIAGNOSIS — I4891 Unspecified atrial fibrillation: Secondary | ICD-10-CM | POA: Insufficient documentation

## 2023-09-08 DIAGNOSIS — Z7901 Long term (current) use of anticoagulants: Secondary | ICD-10-CM | POA: Diagnosis not present

## 2023-09-08 LAB — CBC WITH DIFFERENTIAL (CANCER CENTER ONLY)
Abs Immature Granulocytes: 0.06 10*3/uL (ref 0.00–0.07)
Basophils Absolute: 0.1 10*3/uL (ref 0.0–0.1)
Basophils Relative: 1 %
Eosinophils Absolute: 0.3 10*3/uL (ref 0.0–0.5)
Eosinophils Relative: 2 %
HCT: 47.2 % — ABNORMAL HIGH (ref 36.0–46.0)
Hemoglobin: 15.5 g/dL — ABNORMAL HIGH (ref 12.0–15.0)
Immature Granulocytes: 1 %
Lymphocytes Relative: 17 %
Lymphs Abs: 1.8 10*3/uL (ref 0.7–4.0)
MCH: 29.9 pg (ref 26.0–34.0)
MCHC: 32.8 g/dL (ref 30.0–36.0)
MCV: 91.1 fL (ref 80.0–100.0)
Monocytes Absolute: 1 10*3/uL (ref 0.1–1.0)
Monocytes Relative: 9 %
Neutro Abs: 7.5 10*3/uL (ref 1.7–7.7)
Neutrophils Relative %: 70 %
Platelet Count: 576 10*3/uL — ABNORMAL HIGH (ref 150–400)
RBC: 5.18 MIL/uL — ABNORMAL HIGH (ref 3.87–5.11)
RDW: 15.1 % (ref 11.5–15.5)
WBC Count: 10.7 10*3/uL — ABNORMAL HIGH (ref 4.0–10.5)
nRBC: 0 % (ref 0.0–0.2)
nRBC: 0 /100{WBCs}

## 2023-09-08 NOTE — Telephone Encounter (Signed)
09/08/23 Spoke with patient and confirmed next appt.

## 2023-10-04 DIAGNOSIS — S2232XA Fracture of one rib, left side, initial encounter for closed fracture: Secondary | ICD-10-CM | POA: Diagnosis not present

## 2023-11-30 DIAGNOSIS — Z4689 Encounter for fitting and adjustment of other specified devices: Secondary | ICD-10-CM | POA: Diagnosis not present

## 2023-12-27 ENCOUNTER — Other Ambulatory Visit: Payer: Self-pay | Admitting: Hematology and Oncology

## 2023-12-27 DIAGNOSIS — D693 Immune thrombocytopenic purpura: Secondary | ICD-10-CM

## 2024-01-04 DIAGNOSIS — N3020 Other chronic cystitis without hematuria: Secondary | ICD-10-CM | POA: Diagnosis not present

## 2024-01-04 DIAGNOSIS — N202 Calculus of kidney with calculus of ureter: Secondary | ICD-10-CM | POA: Diagnosis not present

## 2024-01-04 DIAGNOSIS — Z905 Acquired absence of kidney: Secondary | ICD-10-CM | POA: Diagnosis not present

## 2024-01-05 NOTE — Progress Notes (Unsigned)
 [facility]  [address] [place],  [place]  [address] [phone]  Clinic Day:  01/06/2024  Referring physician: Marylen Ponto, MD  HISTORY OF PRESENT ILLNESS:  The patient is an 83 y.o. female with essential thrombocythemia, which is based upon her having an elevated platelet count and testing positive for the JAK2 mutation.  She also has a history of iron deficiency anemia.  She takes hydroxyurea 500 mg every Monday, Wednesday and Friday to keep her platelet count under 400-600.  She comes in today to reassess her peripheral counts.  She denies having any side effects from her hydroxyurea.  She also denies having any clotting complications from her underlying essential thrombocythemia.  However, she comes in today not particularly feeling well.  She has had a productive cough, muscle aches, and generalized malaise.  She has already been ruled out for having COVID.  However, she is concerned some form of viral infection is present.  PHYSICAL EXAM:  Blood pressure 131/75, pulse 80, temperature 98.6 F (37 C), temperature source Oral, resp. rate 16, height 5\' 7"  (1.702 m), weight 182 lb (82.6 kg), SpO2 100%. Wt Readings from Last 3 Encounters:  01/06/24 182 lb (82.6 kg)  09/08/23 182 lb (82.6 kg)  08/28/23 183 lb (83 kg)   Body mass index is 28.51 kg/m. Performance status (ECOG): 1 Physical Exam Constitutional:      Appearance: Normal appearance. She is not ill-appearing.     Comments: She appears weaker versus previous visits.  She is in a wheelchair.  HENT:     Mouth/Throat:     Mouth: Mucous membranes are moist.     Pharynx: Oropharynx is clear. No oropharyngeal exudate or posterior oropharyngeal erythema.  Cardiovascular:     Rate and Rhythm: Normal rate and regular rhythm.     Heart sounds: No murmur heard.    No friction rub. No gallop.  Pulmonary:     Effort: Pulmonary effort is normal. No respiratory distress.     Breath sounds: Normal  breath sounds. No wheezing, rhonchi or rales.  Abdominal:     General: Bowel sounds are normal. There is no distension.     Palpations: Abdomen is soft. There is no mass.     Tenderness: There is no abdominal tenderness.  Musculoskeletal:        General: No swelling.     Right lower leg: No edema.     Left lower leg: No edema.  Lymphadenopathy:     Cervical: No cervical adenopathy.     Upper Body:     Right upper body: No supraclavicular or axillary adenopathy.     Left upper body: No supraclavicular or axillary adenopathy.     Lower Body: No right inguinal adenopathy. No left inguinal adenopathy.  Skin:    General: Skin is warm.     Coloration: Skin is not jaundiced.     Findings: No lesion or rash.  Neurological:     General: No focal deficit present.     Mental Status: She is alert and oriented to person, place, and time. Mental status is at baseline.  Psychiatric:        Mood and Affect: Mood normal.        Behavior: Behavior normal.        Thought Content: Thought content normal.    LABS:    Latest Reference Range & Units 01/06/24 09:58  WBC 4.0 - 10.5 K/uL 7.6  RBC 3.87 - 5.11  MIL/uL 5.02  Hemoglobin 12.0 - 15.0 g/dL 57.8  HCT 46.9 - 62.9 % 45.3  MCV 80.0 - 100.0 fL 90.2  MCH 26.0 - 34.0 pg 29.3  MCHC 30.0 - 36.0 g/dL 52.8  RDW 41.3 - 24.4 % 15.1  Platelets 150 - 400 K/uL 426 (H)  nRBC 0.0 - 0.2 % 0 /100 WBC 0.00 0  Neutrophils % 68  Lymphocytes % 11  Monocytes Relative % 15  Eosinophil % 4  Basophil % 1  Immature Granulocytes % 1  (H): Data is abnormally high  ASSESSMENT & PLAN:  Assessment/Plan:  An 83 y.o. female with essential thrombocythemia, as well as a history iron deficiency anemia.  When evaluating her labs today, I am pleased as her platelets remain well below 600.  She will continue to take hydroxyurea 500 mg every Monday, Wednesday, and Friday to keep her platelets from precipitously rising. Of note, she is on Eliquis due to her history of atrial  fibrillation. This agent should also protect her from developing clotting complications related to her underlying essential thrombocythemia.  As mentioned previously, the major problem today with this patient is a high likelihood of a viral illness.  A respiratory viral culture will be sent, they will evaluate for COVID, influenza A, influenza B, and RSV.  I will notify her of the results as soon as they become available.  Otherwise, as the patient is doing very well from a hematologic perspective, I will see her back in 6 months for repeat clinical assessment.  The patient understands all the plans discussed today and is in agreement with them.    Zyionna Pesce Kirby Funk, MD

## 2024-01-06 ENCOUNTER — Inpatient Hospital Stay: Payer: PPO

## 2024-01-06 ENCOUNTER — Other Ambulatory Visit: Payer: Self-pay | Admitting: Oncology

## 2024-01-06 ENCOUNTER — Inpatient Hospital Stay: Payer: PPO | Attending: Oncology | Admitting: Oncology

## 2024-01-06 ENCOUNTER — Telehealth: Payer: Self-pay | Admitting: Oncology

## 2024-01-06 VITALS — BP 131/75 | HR 80 | Temp 98.6°F | Resp 16 | Ht 67.0 in | Wt 182.0 lb

## 2024-01-06 DIAGNOSIS — Z7901 Long term (current) use of anticoagulants: Secondary | ICD-10-CM | POA: Insufficient documentation

## 2024-01-06 DIAGNOSIS — D509 Iron deficiency anemia, unspecified: Secondary | ICD-10-CM | POA: Insufficient documentation

## 2024-01-06 DIAGNOSIS — Z79899 Other long term (current) drug therapy: Secondary | ICD-10-CM | POA: Diagnosis not present

## 2024-01-06 DIAGNOSIS — D473 Essential (hemorrhagic) thrombocythemia: Secondary | ICD-10-CM | POA: Insufficient documentation

## 2024-01-06 DIAGNOSIS — I4891 Unspecified atrial fibrillation: Secondary | ICD-10-CM | POA: Diagnosis not present

## 2024-01-06 LAB — CBC WITH DIFFERENTIAL (CANCER CENTER ONLY)
Abs Immature Granulocytes: 0.04 10*3/uL (ref 0.00–0.07)
Basophils Absolute: 0.1 10*3/uL (ref 0.0–0.1)
Basophils Relative: 1 %
Eosinophils Absolute: 0.3 10*3/uL (ref 0.0–0.5)
Eosinophils Relative: 4 %
HCT: 45.3 % (ref 36.0–46.0)
Hemoglobin: 14.7 g/dL (ref 12.0–15.0)
Immature Granulocytes: 1 %
Lymphocytes Relative: 11 %
Lymphs Abs: 0.8 10*3/uL (ref 0.7–4.0)
MCH: 29.3 pg (ref 26.0–34.0)
MCHC: 32.5 g/dL (ref 30.0–36.0)
MCV: 90.2 fL (ref 80.0–100.0)
Monocytes Absolute: 1.2 10*3/uL — ABNORMAL HIGH (ref 0.1–1.0)
Monocytes Relative: 15 %
Neutro Abs: 5.2 10*3/uL (ref 1.7–7.7)
Neutrophils Relative %: 68 %
Platelet Count: 426 10*3/uL — ABNORMAL HIGH (ref 150–400)
RBC: 5.02 MIL/uL (ref 3.87–5.11)
RDW: 15.1 % (ref 11.5–15.5)
WBC Count: 7.6 10*3/uL (ref 4.0–10.5)
nRBC: 0 % (ref 0.0–0.2)
nRBC: 0 /100{WBCs}

## 2024-01-06 LAB — RESP PANEL BY RT-PCR (RSV, FLU A&B, COVID)  RVPGX2
Influenza A by PCR: NEGATIVE
Influenza B by PCR: NEGATIVE
Resp Syncytial Virus by PCR: NEGATIVE
SARS Coronavirus 2 by RT PCR: NEGATIVE

## 2024-01-06 NOTE — Telephone Encounter (Signed)
 Patient has been scheduled for follow-up visit per 01/06/24 LOS.  Pt given an appt calendar with date and time.

## 2024-01-07 ENCOUNTER — Telehealth: Payer: Self-pay

## 2024-01-07 NOTE — Telephone Encounter (Signed)
 Dr Melvyn Neth asked me to tell pt that the virus panel (RSV, flu, COVID) were all negative. If symptoms continue, she should see her PCP.

## 2024-01-11 ENCOUNTER — Encounter (HOSPITAL_BASED_OUTPATIENT_CLINIC_OR_DEPARTMENT_OTHER): Payer: Self-pay

## 2024-01-11 ENCOUNTER — Ambulatory Visit (HOSPITAL_BASED_OUTPATIENT_CLINIC_OR_DEPARTMENT_OTHER)
Admission: RE | Admit: 2024-01-11 | Discharge: 2024-01-11 | Disposition: A | Discharge status: Home / Self Care | Payer: Self-pay | Source: Ambulatory Visit | Attending: Family Medicine | Admitting: Family Medicine

## 2024-01-11 ENCOUNTER — Ambulatory Visit (HOSPITAL_BASED_OUTPATIENT_CLINIC_OR_DEPARTMENT_OTHER): Admitting: Radiology

## 2024-01-11 VITALS — BP 133/82 | HR 86 | Temp 97.9°F | Resp 20

## 2024-01-11 DIAGNOSIS — R509 Fever, unspecified: Secondary | ICD-10-CM

## 2024-01-11 DIAGNOSIS — R051 Acute cough: Secondary | ICD-10-CM

## 2024-01-11 DIAGNOSIS — J189 Pneumonia, unspecified organism: Secondary | ICD-10-CM | POA: Diagnosis not present

## 2024-01-11 DIAGNOSIS — R918 Other nonspecific abnormal finding of lung field: Secondary | ICD-10-CM | POA: Diagnosis not present

## 2024-01-11 DIAGNOSIS — R110 Nausea: Secondary | ICD-10-CM | POA: Diagnosis not present

## 2024-01-11 DIAGNOSIS — R059 Cough, unspecified: Secondary | ICD-10-CM | POA: Diagnosis not present

## 2024-01-11 LAB — POC COVID19/FLU A&B COMBO
Covid Antigen, POC: NEGATIVE
Influenza A Antigen, POC: NEGATIVE
Influenza B Antigen, POC: NEGATIVE

## 2024-01-11 MED ORDER — ALBUTEROL SULFATE HFA 108 (90 BASE) MCG/ACT IN AERS: 2.0000 | INHALATION_SPRAY | RESPIRATORY_TRACT | 0 refills | Status: DC | PRN

## 2024-01-11 MED ORDER — COMPACT SPACE CHAMBER DEVI: 0 refills | Status: AC

## 2024-01-11 MED ORDER — CEFDINIR 300 MG PO CAPS: 300.0000 mg | ORAL_CAPSULE | Freq: Two times a day (BID) | ORAL | 0 refills | Status: AC

## 2024-01-11 NOTE — Discharge Instructions (Addendum)
 History and exam are most consistent with community-acquired pneumonia.  Chest x-ray is hazy but does not show consolidation.  Chest x-ray could show early pneumonia or just signs of bronchitis.  Will treat for community-acquired pneumonia.  Will update patient once radiology has read the films.  Provided albuterol inhaler with spacer and instructions on use to be picked up at her pharmacy.  Albuterol inhaler, 2 puffs every 4 hours if needed for wheezing.  She declined cough syrup reporting she has plenty at home.  Cefdinir, 300 mg twice daily for 7 days.  Get plenty of fluids and rest.  Follow-up here or with family doctor in 2 to 3 weeks for recheck or sooner if needed.

## 2024-01-11 NOTE — ED Provider Notes (Signed)
 [facility]    CSN: [id number] Arrival date & time: 01/11/24  1022      History   Chief Complaint Chief Complaint  Patient presents with   Cough   Fever    HPI [name] [name] is a 83 y.o. female.   Patient is here with her husband.  She has had cough, congestion, fevers, some diarrhea and nausea since 01/05/2024.  She saw Dr. Melvyn Neth on 01/06/2024 and had testing for COVID, RSV, flu and all test were negative.  She also had a CBC with differential as she is being managed by Dr. Melvyn Neth for essential thrombocytopenia.  Her platelet count was under 600 and he was pleased with her progress.  Her symptoms have persisted and her cough is getting worse.  She feels very congested in her chest with some wheezing.  She did vomit once today but that is unusual.  She does have chronic nausea and has Zofran at home for use if needed.  She feels like she is able to eat and drink and stay hydrated.  She says she is urinating regularly and her urine is not dark.   Cough Associated symptoms: fever and wheezing   Associated symptoms: no chest pain, no chills, no ear pain, no rash, no shortness of breath and no sore throat   Fever Associated symptoms: congestion, cough, diarrhea, nausea and vomiting   Associated symptoms: no chest pain, no chills, no dysuria, no ear pain, no rash and no sore throat     Past Medical History:  Diagnosis Date   Abdominal aortic atherosclerosis (HCC) 05/07/2016   Noted on CT abd/pelvis   Acute renal failure (HCC) 06/04/2013   Acute respiratory failure with hypoxia (HCC) 06/04/2013   Anemia, unspecified 06/08/2013   Anticoagulant long-term use    ELIQUIS   Cancer (HCC)    uterine cancer    Chronic anticoagulation 08/04/2017   Chronic diastolic (congestive) heart failure (HCC)     pt denies    Chronic diastolic congestive heart failure (HCC) 05/31/2015   Chronic kidney disease 2014   arf on dialysis in icu   Complication of anesthesia    2021- kidney removed  at [facility] pt reports did not get enough med to wake her up and she could not talk and then recovered after given more of wake up med from anesthesia    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 Senaida Ores [facility] in [place])  pt uses pessary   Diarrhea 06/11/2013   Diverticulosis 05/07/2016   Noted on CT abd/pelvis   Dyslipidemia 05/31/2015   E coli bacteremia 06/08/2013   Encounter for monitoring flecainide therapy 07/30/2016   Essential thrombocythemia (HCC) 03/04/2021   Female genital prolapse 04/15/2016   First degree heart block    General weakness    GERD (gastroesophageal reflux disease)    Hematuria    intermittently due to ureter right stent   Hiatal hernia    High risk medication use 04/15/2016   History of arteriovenostomy for renal dialysis [facility])    History of colon polyps    History of gastric polyp    History of kidney infection    12-31-2018  perinephrtic abscess  right side  s/p drains x2 01-10-2019,  05/ 2020 drains removed   History of kidney stones    History of peptic ulcer disease    History of septic shock  2011 (pt unaware) and 06-04-2013  w/ acute renal failure   History of uterine cancer    STAGE I  --  S/P TAH W/ BSO  (NO OTHER TX)   History of ventilator dependency 2014   in setting of septic shock   Hydronephrosis 05/07/2016   Hyperlipemia 04/15/2016   Hyperlipidemia    Hypertension    Iron deficiency anemia hematology/ oncology--- dr Melvyn Neth at [facility] in [place]--- per lov note 08-07-2017  stabilized and felt to be more chronic anemia   01/ 2018  dx iron def. anemia  s/p  IV Iron infusion and taking oral iron supplement   Lactic acidosis 06/04/2013   Lumbar compression fracture (HCC)    01-22-2019 per imaging L1  (06-01-2019 per pt currently no pain)   Midline cystocele 04/15/2016   Nonfunctioning kidney 03/23/2020   NSTEMI, initial episode of care  [facility]) 06/07/2013   pt denies    Occlusion of ureteral stent (HCC)    PAF (paroxysmal atrial fibrillation) (HCC) DX JUNE 2012   CARDIOLOGIST--  DR Claire Shown--- [facility])  CHADS2   Palpitations 04/15/2016   Paroxysmal atrial fibrillation (HCC) 08/04/2017   Presence of pessary    Pyelonephritis 06/04/2013   Right wrist fracture    per pt fractured on 03-10-2019,  had closed reduction and wearing a brace   Sepsis (HCC) 05/07/2016   Septic shock(785.52) 06/04/2013   Tendonitis, Achilles 04/15/2016   Ureteral obstruction, right 06/04/2013   Ureteral stricture, right urologist-- dr Berneice Heinrich   Chronic--- treated with ureteral stent   Urinary tract infection without hematuria    Uses walker    and wheelchair for longer distance   Ventricular ectopy 12/28/2020   Wears glasses     Patient Active Problem List   Diagnosis Date Noted   Complicated UTI (urinary tract infection) 03/31/2021   Cancer (HCC)    Complication of anesthesia    Hypertension    Essential thrombocythemia (HCC) 03/04/2021   Ventricular ectopy 12/28/2020   Presence of pessary    Wears glasses    Uses walker    Right wrist fracture    PAF (paroxysmal atrial fibrillation) (HCC)    Lumbar compression fracture (HCC)    Iron deficiency anemia    Hyperlipidemia    History of uterine cancer    History of septic shock    History of peptic ulcer disease    History of kidney infection    History of kidney stones    History of gastric polyp    History of colon polyps    History of arteriovenostomy for renal dialysis (HCC)    Hiatal hernia    Hematuria    GERD (gastroesophageal reflux disease)    General weakness    First degree heart block    Cystocele, midline    Chronic diastolic (congestive) heart failure (HCC)    Anticoagulant long-term use    Nonfunctioning kidney 03/23/2020   Urinary tract infection without hematuria    Occlusion of ureteral stent (HCC)    Chronic anticoagulation 08/04/2017    Coronary artery disease 06/16/2017   Encounter for monitoring flecainide therapy 07/30/2016   Ureteral stricture, right 05/07/2016   Sepsis (HCC) 05/07/2016   Hydronephrosis 05/07/2016   Diverticulosis 05/07/2016   Abdominal aortic atherosclerosis (HCC) 05/07/2016   Congenital vaginal enterocele 04/15/2016   Female genital prolapse 04/15/2016   High risk medication use 04/15/2016   Hyperlipemia 04/15/2016   Midline cystocele 04/15/2016   Palpitations 04/15/2016  Tendonitis, Achilles 04/15/2016   Paroxysmal atrial fibrillation (HCC) 05/31/2015   Chronic diastolic congestive heart failure (HCC) 05/31/2015   Dyslipidemia 05/31/2015   Diarrhea 06/11/2013   E coli bacteremia 06/08/2013   Anemia, unspecified 06/08/2013   NSTEMI, initial episode of care (HCC) 06/07/2013   Septic shock (HCC) 06/04/2013   Acute respiratory failure with hypoxia (HCC) 06/04/2013   Pyelonephritis of left kidney 06/04/2013   Ureteral obstruction, right 06/04/2013   Acute renal failure (HCC) 06/04/2013   Lactic acidosis 06/04/2013   History of ventilator dependency 2014   Chronic kidney disease 2014    Past Surgical History:  Procedure Laterality Date   APPENDECTOMY     BACK SURGERY     CARDIOVERSION  09/ 2018   dr Bing Matter   successful ([facility] )   CHOLECYSTECTOMY  2010   COLONOSCOPY W/ POLYPECTOMY  05/2017   CYSTO/  BALLOON DILATION RIGHT URETERAL STRICTURE/ STENT PLACEMENT  06-02-2013   CYSTO/  BILATERAL RETROGRADE PYELOGRAM/ RIGHT URETEROSCOPY AND STENT PLACEMENT  10-04-2005   CYSTO/ LEFT URETEROSCOPIC STONE EXTRACTION  04-03-2006   CYSTOSCOPY W/ RETROGRADES Right 01/03/2019   Procedure: CYSTOSCOPY WITH RETROGRADE PYELOGRAM RIGHT WITH STENT EXCHANGE;  Surgeon: Bjorn Pippin, MD;  Location: [facility];  Service: Urology;  Laterality: Right;   CYSTOSCOPY W/ URETERAL STENT PLACEMENT Right 02/06/2014   Procedure: CYSTOSCOPY WITH RIGHT RETROGRADE PYELOGRAM RIGHT STENT REMOVAL and REPLACEMENT, Right  Ureteroscopy;  Surgeon: Kathi Ludwig, MD;  Location: [facility];  Service: Urology;  Laterality: Right;   CYSTOSCOPY W/ URETERAL STENT PLACEMENT Right 11/26/2016   Procedure: CYSTOSCOPY WITH RETROGRADE PYELOGRAM/URETERAL STENT REPLACEMENT;  Surgeon: Sebastian Ache, MD;  Location: [facility];  Service: Urology;  Laterality: Right;   CYSTOSCOPY W/ URETERAL STENT PLACEMENT Right 04/24/2017   Procedure: CYSTOSCOPY WITH RETROGRADE PYELOGRAM/URETERAL STENT REPLACEMENT;  Surgeon: Sebastian Ache, MD;  Location: [facility];  Service: Urology;  Laterality: Right;   CYSTOSCOPY W/ URETERAL STENT PLACEMENT Right 10/07/2017   Procedure: CYSTOSCOPY WITH RETROGRADE PYELOGRAM/URETERAL STENT EXCHANGE;  Surgeon: Sebastian Ache, MD;  Location: [facility];  Service: Urology;  Laterality: Right;   CYSTOSCOPY W/ URETERAL STENT PLACEMENT Right 03/03/2018   Procedure: CYSTOSCOPY WITH RIGHT RETROGRADE / RIGHT URETERAL STENT EXCHANGE;  Surgeon: Sebastian Ache, MD;  Location: [facility];  Service: Urology;  Laterality: Right;   CYSTOSCOPY W/ URETERAL STENT PLACEMENT Right 09/22/2018   Procedure: CYSTOSCOPY WITH RETROGRADE PYELOGRAM/URETERAL STENT PLACEMENT;  Surgeon: Sebastian Ache, MD;  Location: [facility];  Service: Urology;  Laterality: Right;  45 MINS   CYSTOSCOPY W/ URETERAL STENT PLACEMENT Right 06/03/2019   Procedure: CYSTOSCOPY WITH RETROGRADE PYELOGRAM/URETERAL STENT REPLACEMENT;  Surgeon: Sebastian Ache, MD;  Location: [facility];  Service: Urology;  Laterality: Right;   CYSTOSCOPY W/ URETERAL STENT PLACEMENT Right 11/23/2019   Procedure: CYSTOSCOPY WITH RETROGRADE PYELOGRAM/URETERAL STENT PLACEMENT;  Surgeon: Sebastian Ache, MD;  Location: [facility];  Service: Urology;  Laterality: Right;  45 MINS   CYSTOSCOPY WITH LITHOLAPAXY N/A 11/21/2013   Procedure: CYSTOSCOPY WITH LITHOLAPAXY;  Surgeon: Kathi Ludwig, MD;  Location: [facility];  Service: Urology;  Laterality: N/A;   CYSTOSCOPY WITH RETROGRADE PYELOGRAM, URETEROSCOPY AND STENT PLACEMENT Bilateral 03/15/2014   Procedure: CYSTOSCOPY WITH BILATERAL RETROGRADE PYELOGRAM, RIGHT DIAGNOSTIC URETEROSCOPY AND STENT EXCHANGE;  Surgeon: Sebastian Ache, MD;  Location: [facility];  Service: Urology;  Laterality: Bilateral;   CYSTOSCOPY WITH RETROGRADE PYELOGRAM, URETEROSCOPY AND STENT PLACEMENT Left 03/13/2021   Procedure:  FIRST STAGE: CYSTOSCOPY WITH RETROGRADE PYELOGRAM, URETEROSCOPY AND STENT PLACEMENT;  Surgeon: Sebastian Ache, MD;  Location: [facility];  Service: Urology;  Laterality: Left;  75 MINS   CYSTOSCOPY WITH RETROGRADE PYELOGRAM, URETEROSCOPY AND STENT PLACEMENT Left 03/29/2021   Procedure: SECOND STGAE:CYSTOSCOPY WITH RETROGRADE PYELOGRAM, URETEROSCOPY AND STENT EXCHANGE;  Surgeon: Sebastian Ache, MD;  Location: [facility];  Service: Urology;  Laterality: Left;   CYSTOSCOPY WITH STENT PLACEMENT Right 05/07/2016   Procedure: CYSTOSCOPY WITH RETROGRADE PYELOGRAM, URETERAL STENT PLACEMENT;  Surgeon: Marcine Matar, MD;  Location: [facility];  Service: Urology;  Laterality: Right;   EYE SURGERY     Bilateral cataract removal   HEMIARTHROPLASTY HIP Right 11/2016   fractured   HOLMIUM LASER APPLICATION Left 03/13/2021   Procedure: HOLMIUM LASER APPLICATION;  Surgeon: Sebastian Ache, MD;  Location: [facility];  Service: Urology;  Laterality: Left;   IR CATHETER TUBE CHANGE  02/04/2019   IR CATHETER TUBE CHANGE  02/04/2019   IR CATHETER TUBE CHANGE  02/11/2019   IR RADIOLOGIST EVAL & MGMT  03/09/2019   JOINT REPLACEMENT     LUMBAR FUSION  JULY 2013   NEPHROLITHOTOMY  X2 YRS AGO   PERCUTANEOUS NEPHROSTOMY  12/2018   ROBOT ASSISTED LAPAROSCOPIC NEPHRECTOMY Right 03/23/2020   Procedure: XI ROBOTIC ASSISTED LAPAROSCOPIC NEPHRECTOMY;  Surgeon: Sebastian Ache, MD;  Location: [facility];  Service: Urology;  Laterality: Right;   TOTAL ABDOMINAL  HYSTERECTOMY W/ BILATERAL SALPINGOOPHORECTOMY  1989   TOTAL KNEE ARTHROPLASTY Bilateral 1992  &  1996   TRANSTHORACIC ECHOCARDIOGRAM  07-24-2017   dr Bing Matter   ef 60-65% (improved from last echo 2014, was 45-50%)/  trace TR/  mild AV sclerosis without stenosis   URETEROSCOPY Right 11/21/2013   Procedure: URETEROSCOPY WITH STENT REMOVAL AND REPLACEMENT;  Surgeon: Kathi Ludwig, MD;  Location: [facility];  Service: Urology;  Laterality: Right;    OB History   No obstetric history on file.      Home Medications    Prior to Admission medications   Medication Sig Start Date End Date Taking? Authorizing Provider  albuterol (VENTOLIN HFA) 108 (90 Base) MCG/ACT inhaler Inhale 2 puffs into the lungs every 4 (four) hours as needed for wheezing or shortness of breath. 01/11/24  Yes Prescilla Sours, FNP  amitriptyline (ELAVIL) 25 MG tablet Take 25 mg by mouth at bedtime.   Yes [provider]  cefdinir (OMNICEF) 300 MG capsule Take 1 capsule (300 mg total) by mouth 2 (two) times daily for 7 days. 01/11/24 01/18/24 Yes Prescilla Sours, FNP  ELIQUIS 5 MG TABS tablet TAKE ONE TABLET BY MOUTH TWICE DAILY 07/08/23  Yes Georgeanna Lea, MD  flecainide (TAMBOCOR) 100 MG tablet Take 1 tablet (100 mg total) by mouth 2 (two) times daily. 08/03/23  Yes Georgeanna Lea, MD  hydroxyurea (HYDREA) 500 MG capsule TAKE ONE CAPSULE BY MOUTH EVERY MONDAY, WEDNESDAY, AND FRIDAY FOR ELEVATED PLATELETS 12/28/23  Yes Mosher, Harvin Hazel A, PA-C  omeprazole (PRILOSEC) 40 MG capsule Take 40 mg by mouth daily.    Yes [provider]  Spacer/Aero-Holding Chambers (COMPACT SPACE CHAMBER) DEVI Use with the albuterol inhaler 01/11/24  Yes Prescilla Sours, FNP  acetaminophen (TYLENOL) 500 MG tablet Take 1,000 mg by mouth every 6 (six) hours as needed for moderate pain or headache.     [provider]  ondansetron (ZOFRAN-ODT) 4 MG disintegrating tablet Take 4 mg by mouth every 6 (six) hours as  needed for nausea/vomiting. 11/16/18   [provider]  promethazine (PHENERGAN) 25 MG tablet Take 25 mg by mouth every 8 (eight) hours as needed for nausea or vomiting. 03/26/21   [provider]  triamcinolone cream (KENALOG) 0.1 % Apply 1 application topically 2 (two) times daily as needed (eczema).    [provider]    Family History Family History  Problem Relation Age of Onset   Heart disease Mother    Heart failure Mother    Cancer Mother    Heart disease Father    Heart failure Father     Social History Social History   Tobacco Use   Smoking status: Never   Smokeless tobacco: Never  Vaping Use   Vaping status: Never Used  Substance Use Topics   Alcohol use: No   Drug use: No     Allergies   Codeine and Hydrocodone   Review of Systems Review of Systems  Constitutional:  Positive for fever. Negative for chills.  HENT:  Positive for congestion. Negative for ear pain and sore throat.   Eyes:  Negative for pain and visual disturbance.  Respiratory:  Positive for cough and wheezing. Negative for shortness of breath.   Cardiovascular:  Negative for chest pain and palpitations.  Gastrointestinal:  Positive for diarrhea, nausea and vomiting. Negative for abdominal pain and constipation.  Genitourinary:  Negative for dysuria and hematuria.  Musculoskeletal:  Negative for arthralgias and back pain.  Skin:  Negative for color change and rash.  Neurological:  Negative for seizures and syncope.  All other systems reviewed and are negative.    Physical Exam Triage Vital Signs ED Triage Vitals  Encounter Vitals Group     BP 01/11/24 1032 133/82     Systolic BP Percentile --      Diastolic BP Percentile --      Pulse Rate 01/11/24 1032 86     Resp 01/11/24 1032 20     Temp 01/11/24 1032 97.9 F (36.6 C)     Temp Source 01/11/24 1032 Oral     SpO2 01/11/24 1032 95 %     Weight --      Height --      Head Circumference --      Peak Flow  --      Pain Score 01/11/24 1031 0     Pain Loc --      Pain Education --      Exclude from Growth Chart --    No data found.  Updated Vital Signs BP 133/82 (BP Location: Left Arm)   Pulse 86   Temp 97.9 F (36.6 C) (Oral)   Resp 20   SpO2 95%   Visual Acuity Right Eye Distance:   Left Eye Distance:   Bilateral Distance:    Right Eye Near:   Left Eye Near:    Bilateral Near:     Physical Exam Vitals and nursing note reviewed.  Constitutional:      General: She is not in acute distress.    Appearance: She is well-developed. She is not ill-appearing or toxic-appearing.  HENT:     Head: Normocephalic and atraumatic.     Right Ear: Hearing, tympanic membrane, ear canal and external ear normal.     Left Ear: Hearing, tympanic membrane, ear canal and external ear normal.     Nose: No congestion or rhinorrhea.     Right Sinus: No maxillary sinus tenderness or frontal sinus tenderness.     Left Sinus: No maxillary sinus tenderness or  frontal sinus tenderness.     Mouth/Throat:     Lips: Pink.     Mouth: Mucous membranes are moist.     Pharynx: Uvula midline. No oropharyngeal exudate or posterior oropharyngeal erythema.     Tonsils: No tonsillar exudate.  Eyes:     Conjunctiva/sclera: Conjunctivae normal.     Pupils: Pupils are equal, round, and reactive to light.  Cardiovascular:     Rate and Rhythm: Normal rate and regular rhythm.     Heart sounds: S1 normal and S2 normal. No murmur heard. Pulmonary:     Effort: Pulmonary effort is normal. No respiratory distress.     Breath sounds: Examination of the right-upper field reveals wheezing. Examination of the left-upper field reveals wheezing. Examination of the right-lower field reveals decreased breath sounds. Examination of the left-lower field reveals decreased breath sounds. Decreased breath sounds and wheezing present. No rhonchi or rales.  Abdominal:     General: Bowel sounds are normal.     Palpations: Abdomen is  soft.     Tenderness: There is no abdominal tenderness.  Musculoskeletal:        General: No swelling.     Cervical back: Neck supple.  Lymphadenopathy:     Head:     Right side of head: No submental, submandibular, tonsillar, preauricular or posterior auricular adenopathy.     Left side of head: No submental, submandibular, tonsillar, preauricular or posterior auricular adenopathy.     Cervical: No cervical adenopathy.     Right cervical: No superficial cervical adenopathy.    Left cervical: No superficial cervical adenopathy.  Skin:    General: Skin is warm and dry.     Capillary Refill: Capillary refill takes less than 2 seconds.     Findings: No rash.  Neurological:     Mental Status: She is alert and oriented to person, place, and time.     Motor: Weakness present.     Gait: Gait abnormal (Uses a wheelchair due to weakness.).     Comments: Uses a wheelchair and husband reports she does not stand well on her own.  Psychiatric:        Mood and Affect: Mood normal.      UC Treatments / Results  Labs (all labs ordered are listed, but only abnormal results are displayed) Labs Reviewed  POC COVID19/FLU A&B COMBO - Normal    Resp panel by RT-PCR (RSV, Flu A&B, Covid) Anterior Nasal Swab: 01/06/24:      Component Ref Range & Units (hover) 5 d ago 2 yr ago  SARS Coronavirus 2 by RT PCR NEGATIVE NEGATIVE CM    Influenza A by PCR NEGATIVE NEGATIVE  Influenza B by PCR NEGATIVE NEGATIVE CM    Resp Syncytial Virus by PCR NEGATIVE            CBC with Differential (Cancer Center Only): 01/06/24:     Component Ref Range & Units (hover) 5 d ago (01/06/24)  WBC Count 7.6  RBC 5.02  Hemoglobin 14.7  HCT 45.3  MCV 90.2  MCH 29.3  MCHC 32.5  RDW 15.1  Platelet Count 426 High   nRBC 0.00  Neutrophils Relative % 68  Neutro Abs 5.2  Lymphocytes Relative 11  Lymphs Abs 0.8  Monocytes Relative 15  Monocytes Absolute 1.2 High   Eosinophils Relative 4  Eosinophils Absolute  0.3  Basophils Relative 1  Basophils Absolute 0.1  Immature Granulocytes 1  Abs Immature Granulocytes 0.04  nRBC 0  Comment: Performed  at [facility], [address] [place], [place], [address]  Lymphs   Monocytes   Eos   Basos   Monocytes Absolute   EOS (ABSOLUTE)   Immature Grans (Abs)   Resulting Agency [facility]      EKG   Radiology No results found.  Procedures Procedures (including critical care time)  Medications Ordered in UC Medications - No data to display  Initial Impression / Assessment and Plan / UC Course  I have reviewed the triage vital signs and the nursing notes.  Pertinent labs & imaging results that were available during my care of the patient were reviewed by me and considered in my medical decision making (see chart for details).     History, exam and chest x-ray are most consistent with early pneumonia.  Chest x-ray does not show consolidation but is very hazy.  Given her persistent fevers will treat for community-acquired pneumonia.  Will update her once the radiology review has been completed.  Cefdinir, 300 mg twice daily for 7 days.  Albuterol inhaler with spacer, 2 puffs every 4 hours if needed for wheezing.  She declined a cough syrup.  Needs to follow-up here or with PCP in 2 to 3 weeks for recheck of lungs or sooner if not improving. Final Clinical Impressions(s) / UC Diagnoses   Final diagnoses:  Acute cough  Fever, unspecified  Community acquired pneumonia, unspecified laterality     Discharge Instructions      History and exam are most consistent with community-acquired pneumonia.  Chest x-ray is hazy but does not show consolidation.  Chest x-ray could show early pneumonia or just signs of bronchitis.  Will treat for community-acquired pneumonia.  Will update patient once radiology has read the films.  Provided albuterol inhaler with spacer and instructions on use to be picked up at her pharmacy.   Albuterol inhaler, 2 puffs every 4 hours if needed for wheezing.  She declined cough syrup reporting she has plenty at home.  Cefdinir, 300 mg twice daily for 7 days.  Get plenty of fluids and rest.  Follow-up here or with family doctor in 2 to 3 weeks for recheck or sooner if needed.     ED Prescriptions     Medication Sig Dispense Auth. Provider   albuterol (VENTOLIN HFA) 108 (90 Base) MCG/ACT inhaler Inhale 2 puffs into the lungs every 4 (four) hours as needed for wheezing or shortness of breath. 1 each Prescilla Sours, FNP   Spacer/Aero-Holding Chambers (COMPACT SPACE CHAMBER) DEVI Use with the albuterol inhaler 1 each Prescilla Sours, FNP   cefdinir (OMNICEF) 300 MG capsule Take 1 capsule (300 mg total) by mouth 2 (two) times daily for 7 days. 14 capsule Prescilla Sours, FNP      PDMP not reviewed this encounter.   Prescilla Sours, FNP 01/11/24 1139

## 2024-01-11 NOTE — ED Triage Notes (Addendum)
 Pt reports cough, fever, nausea, diarrhea, fatigue x 1 week. Cough has gotten worse since Thursday. Pt seen Dr. Melvyn Neth last week and had blood drawn.

## 2024-01-12 ENCOUNTER — Other Ambulatory Visit: Payer: Self-pay | Admitting: *Deleted

## 2024-01-12 DIAGNOSIS — I480 Paroxysmal atrial fibrillation: Secondary | ICD-10-CM

## 2024-01-12 MED ORDER — APIXABAN 2.5 MG PO TABS: 2.5000 mg | ORAL_TABLET | Freq: Two times a day (BID) | ORAL | 5 refills | Status: DC

## 2024-01-12 NOTE — Telephone Encounter (Signed)
 Pt called and informed of dose reduction.  She is in agreement.  New Rx for Eliquis 2.5mg  twice daily sent to pharmacy.

## 2024-01-12 NOTE — Progress Notes (Signed)
 Chest X- Ray IMPRESSION:  Mild central airway thickening suggesting bronchitis or reactive airways disease. No evidence of pneumonia.  Patient was updated on these results during the Urgent Care visit.

## 2024-01-12 NOTE — Telephone Encounter (Signed)
 Prescription refill request for Eliquis received. Indication: PAF Last office visit: 08/28/23  Anson [place] NP Scr: 1.50 on 08/28/23  Epic Age: 83 Weight: 83kg  Based on above findings Eliquis 2.5mg  twice daily is the appropriate dose.  Pt is taking Eliquis 5mg  twice daily.  Message sent to PharmD Pool to advise on dose adjustment.

## 2024-01-12 NOTE — Telephone Encounter (Signed)
Yes, please switch to Eliquis 2.'5mg'$  BID

## 2024-01-30 ENCOUNTER — Other Ambulatory Visit: Payer: Self-pay | Admitting: Cardiology

## 2024-02-08 ENCOUNTER — Encounter: Payer: Self-pay | Admitting: Cardiology

## 2024-02-11 ENCOUNTER — Other Ambulatory Visit: Payer: Self-pay

## 2024-02-11 ENCOUNTER — Emergency Department (HOSPITAL_COMMUNITY)

## 2024-02-11 ENCOUNTER — Encounter (HOSPITAL_COMMUNITY): Payer: Self-pay | Admitting: Family Medicine

## 2024-02-11 ENCOUNTER — Inpatient Hospital Stay (HOSPITAL_COMMUNITY)
Admission: EM | Admit: 2024-02-11 | Discharge: 2024-02-20 | DRG: 492 | Disposition: A | Discharge status: Skilled Nursing Facility | Attending: Internal Medicine | Admitting: Internal Medicine

## 2024-02-11 ENCOUNTER — Inpatient Hospital Stay (HOSPITAL_COMMUNITY)

## 2024-02-11 DIAGNOSIS — I440 Atrioventricular block, first degree: Secondary | ICD-10-CM | POA: Diagnosis not present

## 2024-02-11 DIAGNOSIS — S82391A Other fracture of lower end of right tibia, initial encounter for closed fracture: Principal | ICD-10-CM | POA: Diagnosis present

## 2024-02-11 DIAGNOSIS — Z885 Allergy status to narcotic agent status: Secondary | ICD-10-CM

## 2024-02-11 DIAGNOSIS — I672 Cerebral atherosclerosis: Secondary | ICD-10-CM | POA: Diagnosis not present

## 2024-02-11 DIAGNOSIS — I5043 Acute on chronic combined systolic (congestive) and diastolic (congestive) heart failure: Secondary | ICD-10-CM | POA: Diagnosis present

## 2024-02-11 DIAGNOSIS — S199XXA Unspecified injury of neck, initial encounter: Secondary | ICD-10-CM | POA: Diagnosis not present

## 2024-02-11 DIAGNOSIS — I472 Ventricular tachycardia, unspecified: Secondary | ICD-10-CM | POA: Diagnosis present

## 2024-02-11 DIAGNOSIS — R0989 Other specified symptoms and signs involving the circulatory and respiratory systems: Secondary | ICD-10-CM | POA: Diagnosis not present

## 2024-02-11 DIAGNOSIS — R0689 Other abnormalities of breathing: Secondary | ICD-10-CM | POA: Diagnosis not present

## 2024-02-11 DIAGNOSIS — Z79899 Other long term (current) drug therapy: Secondary | ICD-10-CM | POA: Diagnosis not present

## 2024-02-11 DIAGNOSIS — Z905 Acquired absence of kidney: Secondary | ICD-10-CM

## 2024-02-11 DIAGNOSIS — E559 Vitamin D deficiency, unspecified: Secondary | ICD-10-CM | POA: Diagnosis present

## 2024-02-11 DIAGNOSIS — D62 Acute posthemorrhagic anemia: Secondary | ICD-10-CM | POA: Diagnosis not present

## 2024-02-11 DIAGNOSIS — W010XXA Fall on same level from slipping, tripping and stumbling without subsequent striking against object, initial encounter: Secondary | ICD-10-CM | POA: Diagnosis present

## 2024-02-11 DIAGNOSIS — Z7901 Long term (current) use of anticoagulants: Secondary | ICD-10-CM

## 2024-02-11 DIAGNOSIS — W19XXXD Unspecified fall, subsequent encounter: Secondary | ICD-10-CM | POA: Diagnosis not present

## 2024-02-11 DIAGNOSIS — S82451A Displaced comminuted fracture of shaft of right fibula, initial encounter for closed fracture: Secondary | ICD-10-CM | POA: Diagnosis not present

## 2024-02-11 DIAGNOSIS — Z90722 Acquired absence of ovaries, bilateral: Secondary | ICD-10-CM

## 2024-02-11 DIAGNOSIS — I5021 Acute systolic (congestive) heart failure: Secondary | ICD-10-CM | POA: Diagnosis not present

## 2024-02-11 DIAGNOSIS — R000 Tachycardia, unspecified: Secondary | ICD-10-CM | POA: Diagnosis not present

## 2024-02-11 DIAGNOSIS — D509 Iron deficiency anemia, unspecified: Secondary | ICD-10-CM | POA: Diagnosis not present

## 2024-02-11 DIAGNOSIS — S0990XA Unspecified injury of head, initial encounter: Secondary | ICD-10-CM | POA: Diagnosis not present

## 2024-02-11 DIAGNOSIS — Z8701 Personal history of pneumonia (recurrent): Secondary | ICD-10-CM

## 2024-02-11 DIAGNOSIS — F32A Depression, unspecified: Secondary | ICD-10-CM | POA: Diagnosis not present

## 2024-02-11 DIAGNOSIS — N1832 Chronic kidney disease, stage 3b: Secondary | ICD-10-CM | POA: Diagnosis present

## 2024-02-11 DIAGNOSIS — I214 Non-ST elevation (NSTEMI) myocardial infarction: Secondary | ICD-10-CM | POA: Diagnosis present

## 2024-02-11 DIAGNOSIS — I6529 Occlusion and stenosis of unspecified carotid artery: Secondary | ICD-10-CM | POA: Diagnosis not present

## 2024-02-11 DIAGNOSIS — K219 Gastro-esophageal reflux disease without esophagitis: Secondary | ICD-10-CM | POA: Diagnosis present

## 2024-02-11 DIAGNOSIS — M810 Age-related osteoporosis without current pathological fracture: Secondary | ICD-10-CM | POA: Diagnosis present

## 2024-02-11 DIAGNOSIS — Z96653 Presence of artificial knee joint, bilateral: Secondary | ICD-10-CM | POA: Diagnosis not present

## 2024-02-11 DIAGNOSIS — R2681 Unsteadiness on feet: Secondary | ICD-10-CM | POA: Diagnosis not present

## 2024-02-11 DIAGNOSIS — N179 Acute kidney failure, unspecified: Secondary | ICD-10-CM | POA: Diagnosis not present

## 2024-02-11 DIAGNOSIS — S82301A Unspecified fracture of lower end of right tibia, initial encounter for closed fracture: Secondary | ICD-10-CM | POA: Diagnosis not present

## 2024-02-11 DIAGNOSIS — R0902 Hypoxemia: Secondary | ICD-10-CM | POA: Diagnosis not present

## 2024-02-11 DIAGNOSIS — E860 Dehydration: Secondary | ICD-10-CM | POA: Diagnosis present

## 2024-02-11 DIAGNOSIS — Z9049 Acquired absence of other specified parts of digestive tract: Secondary | ICD-10-CM

## 2024-02-11 DIAGNOSIS — Y92008 Other place in unspecified non-institutional (private) residence as the place of occurrence of the external cause: Secondary | ICD-10-CM

## 2024-02-11 DIAGNOSIS — I130 Hypertensive heart and chronic kidney disease with heart failure and stage 1 through stage 4 chronic kidney disease, or unspecified chronic kidney disease: Secondary | ICD-10-CM | POA: Diagnosis not present

## 2024-02-11 DIAGNOSIS — S82401A Unspecified fracture of shaft of right fibula, initial encounter for closed fracture: Secondary | ICD-10-CM | POA: Diagnosis not present

## 2024-02-11 DIAGNOSIS — B952 Enterococcus as the cause of diseases classified elsewhere: Secondary | ICD-10-CM | POA: Diagnosis not present

## 2024-02-11 DIAGNOSIS — S82831A Other fracture of upper and lower end of right fibula, initial encounter for closed fracture: Secondary | ICD-10-CM | POA: Diagnosis present

## 2024-02-11 DIAGNOSIS — Z043 Encounter for examination and observation following other accident: Secondary | ICD-10-CM | POA: Diagnosis not present

## 2024-02-11 DIAGNOSIS — I252 Old myocardial infarction: Secondary | ICD-10-CM

## 2024-02-11 DIAGNOSIS — M25571 Pain in right ankle and joints of right foot: Secondary | ICD-10-CM | POA: Diagnosis present

## 2024-02-11 DIAGNOSIS — Z6828 Body mass index (BMI) 28.0-28.9, adult: Secondary | ICD-10-CM

## 2024-02-11 DIAGNOSIS — I2510 Atherosclerotic heart disease of native coronary artery without angina pectoris: Secondary | ICD-10-CM | POA: Diagnosis present

## 2024-02-11 DIAGNOSIS — N8111 Cystocele, midline: Secondary | ICD-10-CM | POA: Diagnosis present

## 2024-02-11 DIAGNOSIS — Z8249 Family history of ischemic heart disease and other diseases of the circulatory system: Secondary | ICD-10-CM

## 2024-02-11 DIAGNOSIS — E871 Hypo-osmolality and hyponatremia: Secondary | ICD-10-CM | POA: Diagnosis not present

## 2024-02-11 DIAGNOSIS — R918 Other nonspecific abnormal finding of lung field: Secondary | ICD-10-CM | POA: Diagnosis not present

## 2024-02-11 DIAGNOSIS — Z8619 Personal history of other infectious and parasitic diseases: Secondary | ICD-10-CM

## 2024-02-11 DIAGNOSIS — Z860100 Personal history of colon polyps, unspecified: Secondary | ICD-10-CM

## 2024-02-11 DIAGNOSIS — I10 Essential (primary) hypertension: Secondary | ICD-10-CM | POA: Diagnosis not present

## 2024-02-11 DIAGNOSIS — I959 Hypotension, unspecified: Secondary | ICD-10-CM

## 2024-02-11 DIAGNOSIS — Z90710 Acquired absence of both cervix and uterus: Secondary | ICD-10-CM

## 2024-02-11 DIAGNOSIS — W19XXXA Unspecified fall, initial encounter: Secondary | ICD-10-CM | POA: Diagnosis not present

## 2024-02-11 DIAGNOSIS — Z87442 Personal history of urinary calculi: Secondary | ICD-10-CM

## 2024-02-11 DIAGNOSIS — S82899A Other fracture of unspecified lower leg, initial encounter for closed fracture: Principal | ICD-10-CM

## 2024-02-11 DIAGNOSIS — I480 Paroxysmal atrial fibrillation: Secondary | ICD-10-CM | POA: Diagnosis not present

## 2024-02-11 DIAGNOSIS — I5032 Chronic diastolic (congestive) heart failure: Secondary | ICD-10-CM | POA: Diagnosis not present

## 2024-02-11 DIAGNOSIS — Z8711 Personal history of peptic ulcer disease: Secondary | ICD-10-CM

## 2024-02-11 DIAGNOSIS — Z981 Arthrodesis status: Secondary | ICD-10-CM | POA: Diagnosis not present

## 2024-02-11 DIAGNOSIS — R1110 Vomiting, unspecified: Secondary | ICD-10-CM | POA: Diagnosis present

## 2024-02-11 DIAGNOSIS — I25118 Atherosclerotic heart disease of native coronary artery with other forms of angina pectoris: Secondary | ICD-10-CM | POA: Diagnosis not present

## 2024-02-11 DIAGNOSIS — N183 Chronic kidney disease, stage 3 unspecified: Secondary | ICD-10-CM | POA: Diagnosis not present

## 2024-02-11 DIAGNOSIS — R2689 Other abnormalities of gait and mobility: Secondary | ICD-10-CM | POA: Diagnosis not present

## 2024-02-11 DIAGNOSIS — Z9889 Other specified postprocedural states: Secondary | ICD-10-CM | POA: Diagnosis not present

## 2024-02-11 DIAGNOSIS — Z96641 Presence of right artificial hip joint: Secondary | ICD-10-CM | POA: Diagnosis not present

## 2024-02-11 DIAGNOSIS — N200 Calculus of kidney: Secondary | ICD-10-CM | POA: Diagnosis present

## 2024-02-11 DIAGNOSIS — S82891A Other fracture of right lower leg, initial encounter for closed fracture: Secondary | ICD-10-CM | POA: Diagnosis not present

## 2024-02-11 DIAGNOSIS — N390 Urinary tract infection, site not specified: Secondary | ICD-10-CM | POA: Diagnosis not present

## 2024-02-11 DIAGNOSIS — D75839 Thrombocytosis, unspecified: Secondary | ICD-10-CM | POA: Diagnosis not present

## 2024-02-11 DIAGNOSIS — N281 Cyst of kidney, acquired: Secondary | ICD-10-CM | POA: Diagnosis not present

## 2024-02-11 DIAGNOSIS — A419 Sepsis, unspecified organism: Secondary | ICD-10-CM | POA: Diagnosis present

## 2024-02-11 DIAGNOSIS — F419 Anxiety disorder, unspecified: Secondary | ICD-10-CM | POA: Diagnosis present

## 2024-02-11 DIAGNOSIS — Z87440 Personal history of urinary (tract) infections: Secondary | ICD-10-CM

## 2024-02-11 DIAGNOSIS — E785 Hyperlipidemia, unspecified: Secondary | ICD-10-CM | POA: Diagnosis present

## 2024-02-11 DIAGNOSIS — I4729 Other ventricular tachycardia: Principal | ICD-10-CM | POA: Diagnosis present

## 2024-02-11 DIAGNOSIS — I4891 Unspecified atrial fibrillation: Secondary | ICD-10-CM | POA: Diagnosis not present

## 2024-02-11 DIAGNOSIS — S82831D Other fracture of upper and lower end of right fibula, subsequent encounter for closed fracture with routine healing: Secondary | ICD-10-CM | POA: Diagnosis not present

## 2024-02-11 DIAGNOSIS — R630 Anorexia: Secondary | ICD-10-CM | POA: Diagnosis present

## 2024-02-11 HISTORY — DX: Other ventricular tachycardia: I47.29

## 2024-02-11 HISTORY — DX: Unspecified fracture of lower end of right tibia, initial encounter for closed fracture: S82.831A

## 2024-02-11 HISTORY — DX: Urinary tract infection, site not specified: A41.9

## 2024-02-11 LAB — URINALYSIS, ROUTINE W REFLEX MICROSCOPIC
Bilirubin Urine: NEGATIVE
Glucose, UA: NEGATIVE mg/dL
Ketones, ur: NEGATIVE mg/dL
Nitrite: POSITIVE — AB
Protein, ur: 100 mg/dL — AB
RBC / HPF: >50 RBC/hpf (ref 0–5)
Specific Gravity, Urine: 1.014 (ref 1.005–1.030)
WBC, UA: >50 WBC/hpf (ref 0–5)
pH: 5 (ref 5.0–8.0)

## 2024-02-11 LAB — CBC
HCT: 46.1 % — ABNORMAL HIGH (ref 36.0–46.0)
Hemoglobin: 14.2 g/dL (ref 12.0–15.0)
MCH: 27.9 pg (ref 26.0–34.0)
MCHC: 30.8 g/dL (ref 30.0–36.0)
MCV: 90.6 fL (ref 80.0–100.0)
Platelets: 360 10*3/uL (ref 150–400)
RBC: 5.09 MIL/uL (ref 3.87–5.11)
RDW: 15.9 % — ABNORMAL HIGH (ref 11.5–15.5)
WBC: 23.7 10*3/uL — ABNORMAL HIGH (ref 4.0–10.5)
nRBC: 0 % (ref 0.0–0.2)

## 2024-02-11 LAB — COMPREHENSIVE METABOLIC PANEL WITH GFR
ALT: 11 U/L (ref 0–44)
AST: 30 U/L (ref 15–41)
Albumin: 2.5 g/dL — ABNORMAL LOW (ref 3.5–5.0)
Alkaline Phosphatase: 64 U/L (ref 38–126)
Anion gap: 12 (ref 5–15)
BUN: 43 mg/dL — ABNORMAL HIGH (ref 8–23)
CO2: 20 mmol/L — ABNORMAL LOW (ref 22–32)
Calcium: 8.2 mg/dL — ABNORMAL LOW (ref 8.9–10.3)
Chloride: 103 mmol/L (ref 98–111)
Creatinine, Ser: 3.1 mg/dL — ABNORMAL HIGH (ref 0.44–1.00)
GFR, Estimated: 15 mL/min — ABNORMAL LOW (ref >60)
Glucose, Bld: 97 mg/dL (ref 70–99)
Potassium: 4.3 mmol/L (ref 3.5–5.1)
Sodium: 135 mmol/L (ref 135–145)
Total Bilirubin: 0.8 mg/dL (ref 0.0–1.2)
Total Protein: 5.7 g/dL — ABNORMAL LOW (ref 6.5–8.1)

## 2024-02-11 LAB — I-STAT CHEM 8, ED
BUN: 38 mg/dL — ABNORMAL HIGH (ref 8–23)
Calcium, Ion: 1.02 mmol/L — ABNORMAL LOW (ref 1.15–1.40)
Chloride: 106 mmol/L (ref 98–111)
Creatinine, Ser: 3 mg/dL — ABNORMAL HIGH (ref 0.44–1.00)
Glucose, Bld: 90 mg/dL (ref 70–99)
HCT: 45 % (ref 36.0–46.0)
Hemoglobin: 15.3 g/dL — ABNORMAL HIGH (ref 12.0–15.0)
Potassium: 4 mmol/L (ref 3.5–5.1)
Sodium: 138 mmol/L (ref 135–145)
TCO2: 18 mmol/L — ABNORMAL LOW (ref 22–32)

## 2024-02-11 LAB — ABO/RH: ABO/RH(D): A POS

## 2024-02-11 LAB — PROTIME-INR
INR: 1.8 — ABNORMAL HIGH (ref 0.8–1.2)
Prothrombin Time: 20.7 s — ABNORMAL HIGH (ref 11.4–15.2)

## 2024-02-11 LAB — TSH: TSH: 1.862 u[IU]/mL (ref 0.350–4.500)

## 2024-02-11 LAB — TYPE AND SCREEN
ABO/RH(D): A POS
Antibody Screen: NEGATIVE

## 2024-02-11 LAB — MAGNESIUM: Magnesium: 1.7 mg/dL (ref 1.7–2.4)

## 2024-02-11 LAB — TROPONIN I (HIGH SENSITIVITY)
Troponin I (High Sensitivity): 1228 ng/L (ref <18)
Troponin I (High Sensitivity): 755 ng/L (ref <18)

## 2024-02-11 LAB — I-STAT CG4 LACTIC ACID, ED: Lactic Acid, Venous: 1.7 mmol/L (ref 0.5–1.9)

## 2024-02-11 MED ORDER — AMIODARONE HCL IN DEXTROSE 360-4.14 MG/200ML-% IV SOLN
60.0000 mg/h | INTRAVENOUS | Status: AC
  Administered 2024-02-11: 60 mg/h via INTRAVENOUS
  Filled 2024-02-11 (×2): qty 200

## 2024-02-11 MED ORDER — FENTANYL CITRATE PF 50 MCG/ML IJ SOSY
12.5000 ug | PREFILLED_SYRINGE | INTRAMUSCULAR | Status: DC | PRN
  Administered 2024-02-12 – 2024-02-13 (×5): 50 ug via INTRAVENOUS
  Filled 2024-02-11 (×5): qty 1

## 2024-02-11 MED ORDER — HEPARIN (PORCINE) 25000 UT/250ML-% IV SOLN
1750.0000 [IU]/h | INTRAVENOUS | Status: DC
  Administered 2024-02-11: 1000 [IU]/h via INTRAVENOUS
  Administered 2024-02-13: 1200 [IU]/h via INTRAVENOUS
  Administered 2024-02-13: 1100 [IU]/h via INTRAVENOUS
  Filled 2024-02-11 (×3): qty 250

## 2024-02-11 MED ORDER — SODIUM CHLORIDE 0.9 % IV SOLN
2.0000 g | INTRAVENOUS | Status: DC
  Administered 2024-02-11 – 2024-02-13 (×3): 2 g via INTRAVENOUS
  Filled 2024-02-11 (×3): qty 20

## 2024-02-11 MED ORDER — SODIUM CHLORIDE 0.9 % IV BOLUS
1000.0000 mL | Freq: Once | INTRAVENOUS | Status: AC
  Administered 2024-02-11: 1000 mL via INTRAVENOUS

## 2024-02-11 MED ORDER — MAGNESIUM SULFATE 2 GM/50ML IV SOLN
2.0000 g | Freq: Once | INTRAVENOUS | Status: AC
  Administered 2024-02-11: 2 g via INTRAVENOUS
  Filled 2024-02-11: qty 50

## 2024-02-11 MED ORDER — LORAZEPAM 2 MG/ML IJ SOLN
0.5000 mg | Freq: Once | INTRAMUSCULAR | Status: AC
  Administered 2024-02-11: 0.5 mg via INTRAVENOUS
  Filled 2024-02-11: qty 1

## 2024-02-11 MED ORDER — SODIUM CHLORIDE 0.9 % IV SOLN: INTRAVENOUS | Status: AC

## 2024-02-11 MED ORDER — SODIUM CHLORIDE 0.9% FLUSH
3.0000 mL | Freq: Two times a day (BID) | INTRAVENOUS | Status: DC
  Administered 2024-02-11 – 2024-02-20 (×17): 3 mL via INTRAVENOUS

## 2024-02-11 MED ORDER — ACETAMINOPHEN 325 MG PO TABS
650.0000 mg | ORAL_TABLET | Freq: Four times a day (QID) | ORAL | Status: DC | PRN
  Administered 2024-02-12 – 2024-02-13 (×2): 650 mg via ORAL
  Filled 2024-02-11 (×3): qty 2

## 2024-02-11 MED ORDER — ACETAMINOPHEN 650 MG RE SUPP: 650.0000 mg | Freq: Four times a day (QID) | RECTAL | Status: DC | PRN

## 2024-02-11 MED ORDER — FENTANYL CITRATE PF 50 MCG/ML IJ SOSY
25.0000 ug | PREFILLED_SYRINGE | Freq: Once | INTRAMUSCULAR | Status: AC
  Administered 2024-02-11: 25 ug via INTRAVENOUS
  Filled 2024-02-11: qty 1

## 2024-02-11 MED ORDER — TRIMETHOBENZAMIDE HCL 100 MG/ML IM SOLN
200.0000 mg | Freq: Four times a day (QID) | INTRAMUSCULAR | Status: DC | PRN
  Administered 2024-02-13: 200 mg via INTRAMUSCULAR
  Filled 2024-02-11 (×4): qty 2

## 2024-02-11 MED ORDER — AMIODARONE LOAD VIA INFUSION
150.0000 mg | Freq: Once | INTRAVENOUS | Status: AC
  Administered 2024-02-11: 150 mg via INTRAVENOUS
  Filled 2024-02-11: qty 83.34

## 2024-02-11 MED ORDER — OXYCODONE HCL 5 MG PO TABS
5.0000 mg | ORAL_TABLET | ORAL | Status: DC | PRN
  Administered 2024-02-13 – 2024-02-14 (×5): 5 mg via ORAL
  Filled 2024-02-11 (×6): qty 1

## 2024-02-11 MED ORDER — PANTOPRAZOLE SODIUM 40 MG PO TBEC
40.0000 mg | DELAYED_RELEASE_TABLET | Freq: Every day | ORAL | Status: DC
  Administered 2024-02-12 – 2024-02-20 (×8): 40 mg via ORAL
  Filled 2024-02-11 (×8): qty 1

## 2024-02-11 MED ORDER — AMIODARONE HCL IN DEXTROSE 360-4.14 MG/200ML-% IV SOLN
30.0000 mg/h | INTRAVENOUS | Status: DC
  Administered 2024-02-11 – 2024-02-13 (×4): 30 mg/h via INTRAVENOUS
  Filled 2024-02-11 (×3): qty 200

## 2024-02-11 NOTE — H&P (Signed)
 History and Physical    Linah Klapper Flori YNW:[id number] DOB: 1940-12-02 DOA: 02/11/2024  PCP: Marylen Ponto, MD   Patient coming from: Home   Chief Complaint: Fall with right ankle injury, recent rigors and left flank pain, N/V/D   HPI: [name] [name] is a 83 y.o. female with medical history significant for CKD 3B, nephrolithiasis, hyperlipidemia, hypertension, chronic HFpEF, thrombocytosis, first-degree heart block, and PAF on Eliquis who presents with right ankle pain after a fall at home.  Patient had been in her usual state until 3 or 4 days ago when she developed left flank pain and then rigors.  She noted her systolic blood pressure to be in the low 80s the following day.  She has continued to feel poor in general with nausea, a couple episodes of vomiting, and some loose stools.  She then felt acutely weak while trying to get into a car at home, fell, and suffered a right ankle deformity with severe pain.  She denies chest pain, cough, shortness of breath, or leg swelling.  ED Course: Upon arrival to the ED, patient is found to be afebrile and saturating well on nasal cannula with transient tachycardia and hypotension.  There was concern for NSVT.  Labs were most notable for creatinine 3.10, WBC 23,700, normal lactic acid, and troponin 1228.  UA features bacteriuria, pyuria, and nitrites.  Imaging is most notable for acute fracture of the distal right tibia and fibula.  Orthopedic surgery and cardiology were consulted by the ED physician and the patient was treated with 1 L NS, 2 g IV magnesium, IV amiodarone, Ativan, and fentanyl.  Review of Systems:  All other systems reviewed and apart from HPI, are negative.  Past Medical History:  Diagnosis Date   Abdominal aortic atherosclerosis (HCC) 05/07/2016   Noted on CT abd/pelvis   Acute renal failure (HCC) 06/04/2013   Acute respiratory failure with hypoxia (HCC) 06/04/2013   Anemia, unspecified 06/08/2013   Anticoagulant  long-term use    ELIQUIS   Cancer (HCC)    uterine cancer    Chronic anticoagulation 08/04/2017   Chronic diastolic (congestive) heart failure (HCC)     pt denies    Chronic diastolic congestive heart failure (HCC) 05/31/2015   Chronic kidney disease 2014   arf on dialysis in icu   Complicated UTI (urinary tract infection) 03/31/2021   Complication of anesthesia    2021- kidney removed at [facility] pt reports did not get enough med to wake her up and she could not talk and then recovered after given more of wake up med from anesthesia    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 Senaida Ores [facility] in [place])  pt uses pessary   Diarrhea 06/11/2013   Diverticulosis 05/07/2016   Noted on CT abd/pelvis   Dyslipidemia 05/31/2015   E coli bacteremia 06/08/2013   Encounter for monitoring flecainide therapy 07/30/2016   Essential thrombocythemia (HCC) 03/04/2021   Female genital prolapse 04/15/2016   First degree heart block    General weakness    GERD (gastroesophageal reflux disease)    Hematuria    intermittently due to ureter right stent   Hiatal hernia    High risk medication use 04/15/2016   History of arteriovenostomy for renal dialysis (HCC)    History of colon polyps    History of gastric polyp    History of kidney infection  12-31-2018  perinephrtic abscess  right side  s/p drains x2 01-10-2019,  05/ 2020 drains removed   History of kidney stones    History of peptic ulcer disease    History of septic shock    2011 (pt unaware) and 06-04-2013  w/ acute renal failure   History of uterine cancer    STAGE I  --  S/P TAH W/ BSO  (NO OTHER TX)   History of ventilator dependency 2014   in setting of septic shock   Hydronephrosis 05/07/2016   Hyperlipemia 04/15/2016   Hyperlipidemia    Hypertension    Iron deficiency anemia hematology/ oncology--- dr Harles Lied at [facility]  in [place]--- per lov note 08-07-2017  stabilized and felt to be more chronic anemia   01/ 2018  dx iron def. anemia  s/p  IV Iron infusion and taking oral iron supplement   Lactic acidosis 06/04/2013   Lumbar compression fracture (HCC)    01-22-2019 per imaging L1  (06-01-2019 per pt currently no pain)   Midline cystocele 04/15/2016   Nonfunctioning kidney 03/23/2020   NSTEMI, initial episode of care [facility]) 06/07/2013   pt denies    Occlusion of ureteral stent (HCC)    PAF (paroxysmal atrial fibrillation) (HCC) DX JUNE 2012   CARDIOLOGIST--  DR Parks Bollman--- [facility])  CHADS2   Palpitations 04/15/2016   Paroxysmal atrial fibrillation (HCC) 08/04/2017   Presence of pessary    Pyelonephritis 06/04/2013   Pyelonephritis of left kidney 06/04/2013   Right wrist fracture    per pt fractured on 03-10-2019,  had closed reduction and wearing a brace   Sepsis (HCC) 05/07/2016   Septic shock (HCC) 06/04/2013   IMO SNOMED Dx Update Oct 2024     Septic shock(785.52) 06/04/2013   Tendonitis, Achilles 04/15/2016   Ureteral obstruction, right 06/04/2013   Ureteral stricture, right urologist-- dr Secundino Dach   Chronic--- treated with ureteral stent   Urinary tract infection without hematuria    Uses walker    and wheelchair for longer distance   Ventricular ectopy 12/28/2020   Wears glasses     Past Surgical History:  Procedure Laterality Date   APPENDECTOMY     BACK SURGERY     CARDIOVERSION  09/ 2018   dr Gordan Latina   successful ([facility] )   CHOLECYSTECTOMY  2010   COLONOSCOPY W/ POLYPECTOMY  05/2017   CYSTO/  BALLOON DILATION RIGHT URETERAL STRICTURE/ STENT PLACEMENT  06-02-2013   CYSTO/  BILATERAL RETROGRADE PYELOGRAM/ RIGHT URETEROSCOPY AND STENT PLACEMENT  10-04-2005   CYSTO/ LEFT URETEROSCOPIC STONE EXTRACTION  04-03-2006   CYSTOSCOPY W/ RETROGRADES Right 01/03/2019   Procedure: CYSTOSCOPY WITH RETROGRADE PYELOGRAM RIGHT WITH STENT EXCHANGE;  Surgeon: Homero Luster, MD;  Location: [facility];  Service: Urology;  Laterality: Right;   CYSTOSCOPY W/ URETERAL STENT PLACEMENT Right 02/06/2014   Procedure: CYSTOSCOPY WITH RIGHT RETROGRADE PYELOGRAM RIGHT STENT REMOVAL and REPLACEMENT, Right Ureteroscopy;  Surgeon: Edmund Gouge, MD;  Location: [facility];  Service: Urology;  Laterality: Right;   CYSTOSCOPY W/ URETERAL STENT PLACEMENT Right 11/26/2016   Procedure: CYSTOSCOPY WITH RETROGRADE PYELOGRAM/URETERAL STENT REPLACEMENT;  Surgeon: Osborn Blaze, MD;  Location: [facility];  Service: Urology;  Laterality: Right;   CYSTOSCOPY W/ URETERAL STENT PLACEMENT Right 04/24/2017   Procedure: CYSTOSCOPY WITH RETROGRADE PYELOGRAM/URETERAL STENT REPLACEMENT;  Surgeon: Osborn Blaze, MD;  Location: [facility];  Service: Urology;  Laterality: Right;   CYSTOSCOPY W/ URETERAL STENT  PLACEMENT Right 10/07/2017   Procedure: CYSTOSCOPY WITH RETROGRADE PYELOGRAM/URETERAL STENT EXCHANGE;  Surgeon: Osborn Blaze, MD;  Location: [facility];  Service: Urology;  Laterality: Right;   CYSTOSCOPY W/ URETERAL STENT PLACEMENT Right 03/03/2018   Procedure: CYSTOSCOPY WITH RIGHT RETROGRADE / RIGHT URETERAL STENT EXCHANGE;  Surgeon: Osborn Blaze, MD;  Location: [facility];  Service: Urology;  Laterality: Right;   CYSTOSCOPY W/ URETERAL STENT PLACEMENT Right 09/22/2018   Procedure: CYSTOSCOPY WITH RETROGRADE PYELOGRAM/URETERAL STENT PLACEMENT;  Surgeon: Osborn Blaze, MD;  Location: [facility];  Service: Urology;  Laterality: Right;  45 MINS   CYSTOSCOPY W/ URETERAL STENT PLACEMENT Right 06/03/2019   Procedure: CYSTOSCOPY WITH RETROGRADE PYELOGRAM/URETERAL STENT REPLACEMENT;  Surgeon: Osborn Blaze, MD;  Location: [facility];  Service: Urology;  Laterality: Right;   CYSTOSCOPY W/ URETERAL STENT PLACEMENT Right 11/23/2019   Procedure: CYSTOSCOPY WITH RETROGRADE PYELOGRAM/URETERAL STENT PLACEMENT;   Surgeon: Osborn Blaze, MD;  Location: [facility];  Service: Urology;  Laterality: Right;  45 MINS   CYSTOSCOPY WITH LITHOLAPAXY N/A 11/21/2013   Procedure: CYSTOSCOPY WITH LITHOLAPAXY;  Surgeon: Edmund Gouge, MD;  Location: [facility];  Service: Urology;  Laterality: N/A;   CYSTOSCOPY WITH RETROGRADE PYELOGRAM, URETEROSCOPY AND STENT PLACEMENT Bilateral 03/15/2014   Procedure: CYSTOSCOPY WITH BILATERAL RETROGRADE PYELOGRAM, RIGHT DIAGNOSTIC URETEROSCOPY AND STENT EXCHANGE;  Surgeon: Osborn Blaze, MD;  Location: [facility];  Service: Urology;  Laterality: Bilateral;   CYSTOSCOPY WITH RETROGRADE PYELOGRAM, URETEROSCOPY AND STENT PLACEMENT Left 03/13/2021   Procedure: FIRST STAGE: CYSTOSCOPY WITH RETROGRADE PYELOGRAM, URETEROSCOPY AND STENT PLACEMENT;  Surgeon: Osborn Blaze, MD;  Location: [facility];  Service: Urology;  Laterality: Left;  75 MINS   CYSTOSCOPY WITH RETROGRADE PYELOGRAM, URETEROSCOPY AND STENT PLACEMENT Left 03/29/2021   Procedure: SECOND STGAE:CYSTOSCOPY WITH RETROGRADE PYELOGRAM, URETEROSCOPY AND STENT EXCHANGE;  Surgeon: Osborn Blaze, MD;  Location: [facility];  Service: Urology;  Laterality: Left;   CYSTOSCOPY WITH STENT PLACEMENT Right 05/07/2016   Procedure: CYSTOSCOPY WITH RETROGRADE PYELOGRAM, URETERAL STENT PLACEMENT;  Surgeon: Trent Frizzle, MD;  Location: [facility];  Service: Urology;  Laterality: Right;   EYE SURGERY     Bilateral cataract removal   HEMIARTHROPLASTY HIP Right 11/2016   fractured   HOLMIUM LASER APPLICATION Left 03/13/2021   Procedure: HOLMIUM LASER APPLICATION;  Surgeon: Osborn Blaze, MD;  Location: [facility];  Service: Urology;  Laterality: Left;   IR CATHETER TUBE CHANGE  02/04/2019   IR CATHETER TUBE CHANGE  02/04/2019   IR CATHETER TUBE CHANGE  02/11/2019   IR RADIOLOGIST EVAL & MGMT  03/09/2019   JOINT REPLACEMENT     LUMBAR FUSION  JULY 2013   NEPHROLITHOTOMY  X2 YRS AGO   PERCUTANEOUS NEPHROSTOMY  12/2018   ROBOT  ASSISTED LAPAROSCOPIC NEPHRECTOMY Right 03/23/2020   Procedure: XI ROBOTIC ASSISTED LAPAROSCOPIC NEPHRECTOMY;  Surgeon: Osborn Blaze, MD;  Location: [facility];  Service: Urology;  Laterality: Right;   TOTAL ABDOMINAL HYSTERECTOMY W/ BILATERAL SALPINGOOPHORECTOMY  1989   TOTAL KNEE ARTHROPLASTY Bilateral 1992  &  1996   TRANSTHORACIC ECHOCARDIOGRAM  07-24-2017   dr Gordan Latina   ef 60-65% (improved from last echo 2014, was 45-50%)/  trace TR/  mild AV sclerosis without stenosis   URETEROSCOPY Right 11/21/2013   Procedure: URETEROSCOPY WITH STENT REMOVAL AND REPLACEMENT;  Surgeon: Edmund Gouge, MD;  Location: [facility];  Service: Urology;  Laterality: Right;    Social History:   reports that she has never smoked. She  has never used smokeless tobacco. She reports that she does not drink alcohol and does not use drugs.  Allergies  Allergen Reactions   Codeine Nausea Only and Other (See Comments)    hallucinations   Hydrocodone Nausea Only and Other (See Comments)    Family History  Problem Relation Age of Onset   Heart disease Mother    Heart failure Mother    Cancer Mother    Heart disease Father    Heart failure Father      Prior to Admission medications   Medication Sig Start Date End Date Taking? Authorizing Provider  acetaminophen (TYLENOL) 500 MG tablet Take 1,000 mg by mouth every 6 (six) hours as needed for moderate pain or headache.    Yes [provider]  albuterol (VENTOLIN HFA) 108 (90 Base) MCG/ACT inhaler Inhale 2 puffs into the lungs every 4 (four) hours as needed for wheezing or shortness of breath. 01/11/24  Yes Prescilla Sours, FNP  amitriptyline (ELAVIL) 25 MG tablet Take 25 mg by mouth at bedtime.   Yes [provider]  apixaban (ELIQUIS) 2.5 MG TABS tablet Take 1 tablet (2.5 mg total) by mouth 2 (two) times daily. 01/12/24  Yes Georgeanna Lea, MD  flecainide (TAMBOCOR) 100 MG tablet Take 1 tablet (100 mg total) by mouth 2  (two) times daily. 02/01/24  Yes Georgeanna Lea, MD  hydroxyurea (HYDREA) 500 MG capsule TAKE ONE CAPSULE BY MOUTH EVERY MONDAY, WEDNESDAY, AND FRIDAY FOR ELEVATED PLATELETS 12/28/23  Yes Mosher, Harvin Hazel A, PA-C  omeprazole (PRILOSEC) 40 MG capsule Take 40 mg by mouth daily as needed (heartburn).   Yes [provider]  ondansetron (ZOFRAN-ODT) 4 MG disintegrating tablet Take 4 mg by mouth every 6 (six) hours as needed for nausea/vomiting. 11/16/18  Yes [provider]  triamcinolone cream (KENALOG) 0.1 % Apply 1 application topically 2 (two) times daily as needed (eczema).   Yes [provider]  Spacer/Aero-Holding Chambers (COMPACT SPACE CHAMBER) DEVI Use with the albuterol inhaler 01/11/24   Prescilla Sours, FNP    Physical Exam: Vitals:   02/11/24 1900 02/11/24 1905 02/11/24 1910 02/11/24 1948  BP: 101/67 (!) 108/49 (!) 148/126   Pulse: 75 75 73   Resp: (!) 23 (!) 21 (!) 24   Temp:    98.3 F (36.8 C)  TempSrc:    Oral  SpO2: 96% 100% 100%   Weight:   82.5 kg     Constitutional: NAD, no diaphoresis  Eyes: PERTLA, lids and conjunctivae normal ENMT: Mucous membranes are moist. Posterior pharynx clear of any exudate or lesions.   Neck: supple, no masses  Respiratory: no wheezing, no crackles. No accessory muscle use.  Cardiovascular: S1 & S2 heard, regular rate and rhythm. No extremity edema.   Abdomen: Soft, no tenderness. Bowel sounds active.  Musculoskeletal: no clubbing / cyanosis. Right ankle immobilized, neurovascularly intact.   Skin: no significant rashes, lesions, ulcers. Warm, dry, well-perfused. Neurologic: CN 2-12 grossly intact. Moving all extremities. Alert and oriented.  Psychiatric: Pleasant. Cooperative.    Labs and Imaging on Admission: I have personally reviewed following labs and imaging studies  CBC: Recent Labs  Lab 02/11/24 1442 02/11/24 1449  WBC 23.7*  --   HGB 14.2 15.3*  HCT 46.1* 45.0  MCV 90.6  --   PLT 360  --    Basic  Metabolic Panel: Recent Labs  Lab 02/11/24 1442 02/11/24 1449  NA 135 138  K 4.3 4.0  CL 103 106  CO2 20*  --   GLUCOSE 97 90  BUN 43* 38*  CREATININE 3.10* 3.00*  CALCIUM 8.2*  --   MG 1.7  --    GFR: Estimated Creatinine Clearance: 16.3 mL/min (A) (by C-G formula based on SCr of 3 mg/dL (H)). Liver Function Tests: Recent Labs  Lab 02/11/24 1442  AST 30  ALT 11  ALKPHOS 64  BILITOT 0.8  PROT 5.7*  ALBUMIN 2.5*   No results for input(s): "LIPASE", "AMYLASE" in the last 168 hours. No results for input(s): "AMMONIA" in the last 168 hours. Coagulation Profile: Recent Labs  Lab 02/11/24 1442  INR 1.8*   Cardiac Enzymes: No results for input(s): "CKTOTAL", "CKMB", "CKMBINDEX", "TROPONINI" in the last 168 hours. BNP (last 3 results) No results for input(s): "PROBNP" in the last 8760 hours. HbA1C: No results for input(s): "HGBA1C" in the last 72 hours. CBG: No results for input(s): "GLUCAP" in the last 168 hours. Lipid Profile: No results for input(s): "CHOL", "HDL", "LDLCALC", "TRIG", "CHOLHDL", "LDLDIRECT" in the last 72 hours. Thyroid Function Tests: Recent Labs    02/11/24 1809  TSH 1.862   Anemia Panel: No results for input(s): "VITAMINB12", "FOLATE", "FERRITIN", "TIBC", "IRON", "RETICCTPCT" in the last 72 hours. Urine analysis:    Component Value Date/Time   COLORURINE YELLOW 02/11/2024 1809   APPEARANCEUR CLOUDY (A) 02/11/2024 1809   LABSPEC 1.014 02/11/2024 1809   PHURINE 5.0 02/11/2024 1809   GLUCOSEU NEGATIVE 02/11/2024 1809   HGBUR MODERATE (A) 02/11/2024 1809   BILIRUBINUR NEGATIVE 02/11/2024 1809   KETONESUR NEGATIVE 02/11/2024 1809   PROTEINUR 100 (A) 02/11/2024 1809   UROBILINOGEN 1.0 06/04/2013 1250   NITRITE POSITIVE (A) 02/11/2024 1809   LEUKOCYTESUR LARGE (A) 02/11/2024 1809   Sepsis Labs: @LABRCNTIP (procalcitonin:4,lacticidven:4) )No results found for this or any previous visit (from the past 240 hours).   Radiological Exams on  Admission: CT Head Wo Contrast Result Date: 02/11/2024 CLINICAL DATA:  Head and neck trauma.  Fall.  On blood thinners. EXAM: CT HEAD WITHOUT CONTRAST CT CERVICAL SPINE WITHOUT CONTRAST TECHNIQUE: Multidetector CT imaging of the head and cervical spine was performed following the standard protocol without intravenous contrast. Multiplanar CT image reconstructions of the cervical spine were also generated. RADIATION DOSE REDUCTION: This exam was performed according to the departmental dose-optimization program which includes automated exposure control, adjustment of the mA and/or kV according to patient size and/or use of iterative reconstruction technique. COMPARISON:  None Available. FINDINGS: CT HEAD FINDINGS Brain: No intracranial hemorrhage, mass effect, or evidence of acute infarct. No hydrocephalus. No extra-axial fluid collection. Age related cerebral atrophy and chronic small vessel ischemic disease. Focus of hypoattenuation in the left putamen and thalamus are favored to represent chronic lacunar infarcts. Vascular: No hyperdense vessel. Intracranial arterial calcification. Skull: No fracture or focal lesion. Sinuses/Orbits: No acute finding. Other: None. CT CERVICAL SPINE FINDINGS Alignment: No evidence of traumatic malalignment. Grade 1 anterolisthesis of C2, C3, and C7 is presumed chronic. Skull base and vertebrae: No acute fracture. No primary bone lesion or focal pathologic process. Soft tissues and spinal canal: No prevertebral fluid or swelling. No visible canal hematoma. Disc levels: Multilevel advanced spondylosis, disc space height loss, degenerative endplate changes greatest at C4-C5, C5-C6, and C6-C7. Upper chest: No acute abnormality. Other: Carotid calcification. IMPRESSION: 1. No acute intracranial abnormality. 2. No acute fracture in the cervical spine. Electronically Signed   By: Rozell Cornet M.D.   On: 02/11/2024 18:44   CT Cervical Spine Wo Contrast Result Date:  02/11/2024 CLINICAL DATA:  Head and neck trauma.  Fall.  On blood thinners. EXAM: CT HEAD WITHOUT CONTRAST CT CERVICAL SPINE WITHOUT CONTRAST TECHNIQUE: Multidetector CT imaging of the head and cervical spine was performed following the standard protocol without intravenous contrast. Multiplanar CT image reconstructions of the cervical spine were also generated. RADIATION DOSE REDUCTION: This exam was performed according to the departmental dose-optimization program which includes automated exposure control, adjustment of the mA and/or kV according to patient size and/or use of iterative reconstruction technique. COMPARISON:  None Available. FINDINGS: CT HEAD FINDINGS Brain: No intracranial hemorrhage, mass effect, or evidence of acute infarct. No hydrocephalus. No extra-axial fluid collection. Age related cerebral atrophy and chronic small vessel ischemic disease. Focus of hypoattenuation in the left putamen and thalamus are favored to represent chronic lacunar infarcts. Vascular: No hyperdense vessel. Intracranial arterial calcification. Skull: No fracture or focal lesion. Sinuses/Orbits: No acute finding. Other: None. CT CERVICAL SPINE FINDINGS Alignment: No evidence of traumatic malalignment. Grade 1 anterolisthesis of C2, C3, and C7 is presumed chronic. Skull base and vertebrae: No acute fracture. No primary bone lesion or focal pathologic process. Soft tissues and spinal canal: No prevertebral fluid or swelling. No visible canal hematoma. Disc levels: Multilevel advanced spondylosis, disc space height loss, degenerative endplate changes greatest at C4-C5, C5-C6, and C6-C7. Upper chest: No acute abnormality. Other: Carotid calcification. IMPRESSION: 1. No acute intracranial abnormality. 2. No acute fracture in the cervical spine. Electronically Signed   By: Rozell Cornet M.D.   On: 02/11/2024 18:44   CT ANKLE RIGHT WO CONTRAST Result Date: 02/11/2024 CLINICAL DATA:  Ankle fracture after fall EXAM: CT OF  THE RIGHT ANKLE WITHOUT CONTRAST TECHNIQUE: Multidetector CT imaging of the right ankle was performed according to the standard protocol. Multiplanar CT image reconstructions were also generated. RADIATION DOSE REDUCTION: This exam was performed according to the departmental dose-optimization program which includes automated exposure control, adjustment of the mA and/or kV according to patient size and/or use of iterative reconstruction technique. COMPARISON:  Same day ankle radiographs FINDINGS: Bones/Joint/Cartilage Demineralization. Acute comminuted fracture of the distal tibial metadiaphysis. There is apex medial/posterior angulation. The fracture lines extend into the tibiofibular syndesmosis 4.5 cm above the ankle mortise. No evidence of extension into the tibiotalar joint. Remote fracture fragments about the medial malleolus. Acute comminuted fracture of the distal fibula diaphysis. There is apex medial/posterior angulation. Ligaments Suboptimally assessed by CT. Muscles and Tendons Diffuse atrophy. Soft tissues Soft tissue swelling about the ankle and calcaneus. IMPRESSION: Acute comminuted fractures of the distal tibia and fibula as described. Electronically Signed   By: Rozell Cornet M.D.   On: 02/11/2024 18:36   DG Pelvis 1-2 Views Result Date: 02/11/2024 CLINICAL DATA:  Fall. EXAM: PELVIS - 1-2 VIEW COMPARISON:  01/04/2024. FINDINGS: No acute fracture or diastasis. Prior right hip arthroplasty with appropriate alignment. Sacroiliac joints and pubic symphysis appear anatomically aligned. Partially visualized lower lumbar fusion hardware. IMPRESSION: No acute osseous abnormality identified. Electronically Signed   By: Mannie Seek M.D.   On: 02/11/2024 16:35   DG Ankle 2 Views Right Result Date: 02/11/2024 CLINICAL DATA:  Trauma, fall. EXAM: RIGHT ANKLE - 2 VIEW COMPARISON:  None Available. FINDINGS: There is an acute oblique mildly comminuted fracture of the distal tibial diaphysis. There is 1  cm of posterior distraction of the distal fracture fragment with mild apex medial angulation. There is also an acute comminuted fracture of the distal fibular diaphysis at the same level with apex medial angulation. There  is no definite dislocation, although evaluation is limited secondary to patient positioning. There is soft tissue swelling surrounding the fractures stray IMPRESSION: Acute comminuted fractures of the distal tibial and fibular diaphyses with apex medial angulation. Electronically Signed   By: Tyron Gallon M.D.   On: 02/11/2024 16:33   DG Chest Portable 1 View Result Date: 02/11/2024 CLINICAL DATA:  Status post fall on blood thinners EXAM: PORTABLE CHEST 1 VIEW COMPARISON:  Chest radiograph dated 01/11/2024 FINDINGS: Low lung volumes with bronchovascular crowding. Left basilar patchy opacities. No pleural effusion or pneumothorax. The heart size and mediastinal contours are within normal limits. Questionable transversely oriented lucency through the left lateral fifth rib. IMPRESSION: 1. Low lung volumes with bronchovascular crowding. Left basilar patchy opacities, likely atelectasis. 2. Questionable nondisplaced left lateral fifth rib fracture. Recommend correlation with point tenderness. Electronically Signed   By: Limin  Xu M.D.   On: 02/11/2024 16:32    EKG: Independently reviewed. Wide-complex tachyarrhythmia.   Assessment/Plan   1. NSTEMI - Cardiology consultation appreciated  - Start IV heparin, trend troponin, check echocardiogram    2. NSVT; PAF   - Continue IV amiodarone, hold flecainide, hold Eliquis, and start IV heparin, optimize electrolytes    3. UTI   - Pt reports left flank pain and rigors and has bacteriuria, pyuria, and nitrites in urine  - Culture blood and urine, check renal US  given flank and hx of nephrolithiasis, follow cultures and clinical response to treatment   4. Right ankle fracture - Orthopedic surgery consultation appreciated Continue  pain-control, splint, and NWB for now    5. AKI superimposed on CKD 3B  - Likely prerenal in setting of N/V/D and loss of appetite   - Continue IVF hydration, renally-dose medications, repeat serum chemistries in am    6. N/V/D  - Abdominal exam benign, she reports only 1 loose stool today  - Continue IVF hydration, monitor and replete electrolytes     DVT prophylaxis: IV heparin  Code Status: Full  Level of Care: Level of care: Progressive Family Communication: Husband and daughter at bedside  Disposition Plan:  Patient is from: home  Anticipated d/c is to: TBD Anticipated d/c date is: 02/15/24  Patient currently: Pending improved renal function, treatment of infection, possible right ankle ORIF, transition back to oral medications Consults called: Cardiology, orthopedic surgery  Admission status: Inpatient     Walton Guppy, MD [facility] Hospitalists  02/11/2024, 8:08 PM

## 2024-02-11 NOTE — ED Provider Notes (Signed)
 Patient care was taken over from Dr. Linder Revere.  Patient had a fall in her driveway.  She has an ankle fracture that is being managed by Ortho and likely will need operative repair, possibly tomorrow if medically cleared.  She is also noted to go in and out of a wide-complex tachycardia.  Questionable V. tach versus A-fib with a Baron C.  Was started on amnio and is currently on amnio drip.  Her pressures have been a little soft although they have improved after IV fluids.  She did have another episode of a more narrow tachyarrhythmia that self converted.  I initially discussed with the nurse practitioner with [facility].  She felt that this patient can likely be managed in the stepdown unit.  I spoke with Dr. Brice Campi who will see the patient.   Hershel Los, MD 02/11/24 [phone]

## 2024-02-11 NOTE — ED Triage Notes (Signed)
 EMS from parking lot where pt was attempting to get into the passenger side of vehicle.  Pt states legs gave out, and she fell, hitting head on side of car and her right ankle rotated around 180 degrees.  EMS on scene had no palpable pulses and gave 12 minutes of Nitrous Oxide due to SBP 80's and were able to rotate ankle back. No outward signs of trauma to pt's head.  No pulses palpated piror to rotation or afterwards, but now has sensation and movement to the foot.  Pt arrives A&Ox4, GCS 15 in Afib, having runs of Vtach per EKG.  Pt denies dizziness, LOC, HA or CP, but does state she has been "ill" the last few days with N/V/D with poor intake. PMH: Afib on blood thinners

## 2024-02-11 NOTE — Consult Note (Addendum)
 Cardiology Consultation   Patient ID: ARLINDA BARCELONA MRN: [id number]; DOB: 05/22/1941  Admit date: 02/11/2024 Date of Consult: 02/11/2024  PCP:  Marylen Ponto, MD   [facility] Providers Cardiologist:  Gypsy Balsam, MD      Patient Profile:   Ivalene Platte Rawles is a 83 y.o. female with a hx of PAF, hyperlipidemia, HFpEF, hypertension, PVCs, right nephrectomy, thrombocythemia, and first degree heart block who is being seen 02/11/2024 for the evaluation of NSVT at the request of Dr. Maple Hudson.  History of Present Illness:   Ms. Buchinger was noted to have PAF with cardioversion ?2018.  Echocardiogram 08/2022 showed a preserved LVEF 60 to 65%, mild LVH, grade 1 DD, and ascending aorta measuring 38 mm.  Heart monitor showed 14% PVC burden and she was referred to EP.  She was placed on flecainide which was then increased to 100 mg twice daily, she did not tolerate higher doses of beta-blocker.  Of note flecainide had been started prior to her heart monitor. At follow-up in clinic 08/28/2023 she was maintaining sinus rhythm.  She is maintained on eliquis for stroke prophylaxis.   She presented to [facility] 02/11/24 after suffering a mechanical fall when she lost her balance getting into a car with immediate right ankle pain with deformity. Apparently she was reduced in the field. Orthopedic surgery was consulted. CT scan pendign and planned for ORIF tomorrow if cleared medically. On telemetry, she had several runs of NSVT. Cardiology consulted.   Due to NSVT, IV amiodarone was started. She received 2 g IV Mg.   WBC 23.7 sCr 3.00 Lactic acid 1.7 Mg 1.7  She has a history of multiple kidney stones (since age 60) that led to significant scarring of the ureter resulting in multiple stent placements and eventual removal of her kidney. She has two known kidney stones in the remaining kidney. In Feb, she had bronchitis +/- early PNA, diagnosed at Urgent Care. She was treated with OP PO ABX.  Three weeks ago, she became increasingly weak. At baseline she could ambulate with a walker and go out to eat twice per week. Three weeks ago, she had to use a wheelchair to the restaurant. Family concluded this was due to prolonged recovery from her respiratory illness. About three days ago, she experienced "kidney pain" that she felt was associated with a kidney stone moving. One dose of tramadol resolved the pain and she did not have a recurrence. Later that night, husband noted shaking chills. She had an intermittent low grade fever with Temp in the 99 range. She has had three days of very poor PO intake, intermittent nausea and diarrhea. Today, they were getting into the car to be evaluated at urgent care when she suffered a fall. She states she can generally feel her Afib, and denies feeling Afib today. She denies chest pain, SOB, and recent lower extremity swelling.   Despite very little PO intake, she has not missed any medications, including flecainide and eliquis, last doses were this morning.   Past Medical History:  Diagnosis Date   Abdominal aortic atherosclerosis (HCC) 05/07/2016   Noted on CT abd/pelvis   Acute renal failure (HCC) 06/04/2013   Acute respiratory failure with hypoxia (HCC) 06/04/2013   Anemia, unspecified 06/08/2013   Anticoagulant long-term use    ELIQUIS   Cancer (HCC)    uterine cancer    Chronic anticoagulation 08/04/2017   Chronic diastolic (congestive) heart failure (HCC)     pt denies  Chronic diastolic congestive heart failure (HCC) 05/31/2015   Chronic kidney disease 2014   arf on dialysis in icu   Complicated UTI (urinary tract infection) 03/31/2021   Complication of anesthesia    2021- kidney removed at [facility] pt reports did not get enough med to wake her up and she could not talk and then recovered after given more of wake up med from anesthesia    Congenital vaginal enterocele 04/15/2016   Coronary artery disease    Cystocele, midline     followed by dr c. Wayna Hails [facility] in [place])  pt uses pessary   Diarrhea 06/11/2013   Diverticulosis 05/07/2016   Noted on CT abd/pelvis   Dyslipidemia 05/31/2015   E coli bacteremia 06/08/2013   Encounter for monitoring flecainide therapy 07/30/2016   Essential thrombocythemia (HCC) 03/04/2021   Female genital prolapse 04/15/2016   First degree heart block    General weakness    GERD (gastroesophageal reflux disease)    Hematuria    intermittently due to ureter right stent   Hiatal hernia    High risk medication use 04/15/2016   History of arteriovenostomy for renal dialysis [facility])    History of colon polyps    History of gastric polyp    History of kidney infection    12-31-2018  perinephrtic abscess  right side  s/p drains x2 01-10-2019,  05/ 2020 drains removed   History of kidney stones    History of peptic ulcer disease    History of septic shock    2011 (pt unaware) and 06-04-2013  w/ acute renal failure   History of uterine cancer    STAGE I  --  S/P TAH W/ BSO  (NO OTHER TX)   History of ventilator dependency 2014   in setting of septic shock   Hydronephrosis 05/07/2016   Hyperlipemia 04/15/2016   Hyperlipidemia    Hypertension    Iron deficiency anemia hematology/ oncology--- dr Harles Lied at [facility] in [place]--- per lov note 08-07-2017  stabilized and felt to be more chronic anemia   01/ 2018  dx iron def. anemia  s/p  IV Iron infusion and taking oral iron supplement   Lactic acidosis 06/04/2013   Lumbar compression fracture (HCC)    01-22-2019 per imaging L1  (06-01-2019 per pt currently no pain)   Midline cystocele 04/15/2016   Nonfunctioning kidney 03/23/2020   NSTEMI, initial episode of care [facility]) 06/07/2013   pt denies    Occlusion of ureteral stent (HCC)    PAF (paroxysmal atrial fibrillation) (HCC) DX JUNE 2012   CARDIOLOGIST--  DR Parks Bollman--- [facility])  CHADS2   Palpitations 04/15/2016    Paroxysmal atrial fibrillation (HCC) 08/04/2017   Presence of pessary    Pyelonephritis 06/04/2013   Pyelonephritis of left kidney 06/04/2013   Right wrist fracture    per pt fractured on 03-10-2019,  had closed reduction and wearing a brace   Sepsis (HCC) 05/07/2016   Septic shock (HCC) 06/04/2013   IMO SNOMED Dx Update Oct 2024     Septic shock(785.52) 06/04/2013   Tendonitis, Achilles 04/15/2016   Ureteral obstruction, right 06/04/2013   Ureteral stricture, right urologist-- dr Secundino Dach   Chronic--- treated with ureteral stent   Urinary tract infection without hematuria    Uses walker    and wheelchair for longer distance   Ventricular ectopy 12/28/2020   Wears glasses     Past Surgical History:  Procedure Laterality Date  APPENDECTOMY     BACK SURGERY     CARDIOVERSION  09/ 2018   dr Bing Matter   successful (NSR )   CHOLECYSTECTOMY  2010   COLONOSCOPY W/ POLYPECTOMY  05/2017   CYSTO/  BALLOON DILATION RIGHT URETERAL STRICTURE/ STENT PLACEMENT  06-02-2013   CYSTO/  BILATERAL RETROGRADE PYELOGRAM/ RIGHT URETEROSCOPY AND STENT PLACEMENT  10-04-2005   CYSTO/ LEFT URETEROSCOPIC STONE EXTRACTION  04-03-2006   CYSTOSCOPY W/ RETROGRADES Right 01/03/2019   Procedure: CYSTOSCOPY WITH RETROGRADE PYELOGRAM RIGHT WITH STENT EXCHANGE;  Surgeon: Bjorn Pippin, MD;  Location: [facility];  Service: Urology;  Laterality: Right;   CYSTOSCOPY W/ URETERAL STENT PLACEMENT Right 02/06/2014   Procedure: CYSTOSCOPY WITH RIGHT RETROGRADE PYELOGRAM RIGHT STENT REMOVAL and REPLACEMENT, Right Ureteroscopy;  Surgeon: Kathi Ludwig, MD;  Location: [facility];  Service: Urology;  Laterality: Right;   CYSTOSCOPY W/ URETERAL STENT PLACEMENT Right 11/26/2016   Procedure: CYSTOSCOPY WITH RETROGRADE PYELOGRAM/URETERAL STENT REPLACEMENT;  Surgeon: Sebastian Ache, MD;  Location: [facility];  Service: Urology;  Laterality: Right;   CYSTOSCOPY W/ URETERAL STENT PLACEMENT Right 04/24/2017    Procedure: CYSTOSCOPY WITH RETROGRADE PYELOGRAM/URETERAL STENT REPLACEMENT;  Surgeon: Sebastian Ache, MD;  Location: [facility];  Service: Urology;  Laterality: Right;   CYSTOSCOPY W/ URETERAL STENT PLACEMENT Right 10/07/2017   Procedure: CYSTOSCOPY WITH RETROGRADE PYELOGRAM/URETERAL STENT EXCHANGE;  Surgeon: Sebastian Ache, MD;  Location: [facility];  Service: Urology;  Laterality: Right;   CYSTOSCOPY W/ URETERAL STENT PLACEMENT Right 03/03/2018   Procedure: CYSTOSCOPY WITH RIGHT RETROGRADE / RIGHT URETERAL STENT EXCHANGE;  Surgeon: Sebastian Ache, MD;  Location[facility];  Service: Urology;  Laterality: Right;   CYSTOSCOPY W/ URETERAL STENT PLACEMENT Right 09/22/2018   Procedure: CYSTOSCOPY WITH RETROGRADE PYELOGRAM/URETERAL STENT PLACEMENT;  Surgeon: Sebastian Ache, MD;  Location: [facility];  Service: Urology;  Laterality: Right;  45 MINS   CYSTOSCOPY W/ URETERAL STENT PLACEMENT Right 06/03/2019   Procedure: CYSTOSCOPY WITH RETROGRADE PYELOGRAM/URETERAL STENT REPLACEMENT;  Surgeon: Sebastian Ache, MD;  Location: [facility];  Service: Urology;  Laterality: Right;   CYSTOSCOPY W/ URETERAL STENT PLACEMENT Right 11/23/2019   Procedure: CYSTOSCOPY WITH RETROGRADE PYELOGRAM/URETERAL STENT PLACEMENT;  Surgeon: Sebastian Ache, MD;  Location: [facility];  Service: Urology;  Laterality: Right;  45 MINS   CYSTOSCOPY WITH LITHOLAPAXY N/A 11/21/2013   Procedure: CYSTOSCOPY WITH LITHOLAPAXY;  Surgeon: Kathi Ludwig, MD;  Location: [facility];  Service: Urology;  Laterality: N/A;   CYSTOSCOPY WITH RETROGRADE PYELOGRAM, URETEROSCOPY AND STENT PLACEMENT Bilateral 03/15/2014   Procedure: CYSTOSCOPY WITH BILATERAL RETROGRADE PYELOGRAM, RIGHT DIAGNOSTIC URETEROSCOPY AND STENT EXCHANGE;  Surgeon: Sebastian Ache, MD;  Location: [facility];  Service: Urology;  Laterality: Bilateral;   CYSTOSCOPY WITH RETROGRADE PYELOGRAM,  URETEROSCOPY AND STENT PLACEMENT Left 03/13/2021   Procedure: FIRST STAGE: CYSTOSCOPY WITH RETROGRADE PYELOGRAM, URETEROSCOPY AND STENT PLACEMENT;  Surgeon: Sebastian Ache, MD;  Location: [facility];  Service: Urology;  Laterality: Left;  75 MINS   CYSTOSCOPY WITH RETROGRADE PYELOGRAM, URETEROSCOPY AND STENT PLACEMENT Left 03/29/2021   Procedure: SECOND STGAE:CYSTOSCOPY WITH RETROGRADE PYELOGRAM, URETEROSCOPY AND STENT EXCHANGE;  Surgeon: Sebastian Ache, MD;  Location: [facility];  Service: Urology;  Laterality: Left;   CYSTOSCOPY WITH STENT PLACEMENT Right 05/07/2016   Procedure: CYSTOSCOPY WITH RETROGRADE PYELOGRAM, URETERAL STENT PLACEMENT;  Surgeon: Marcine Matar, MD;  Location: [facility];  Service: Urology;  Laterality: Right;   EYE SURGERY     Bilateral cataract removal  HEMIARTHROPLASTY HIP Right 11/2016   fractured   HOLMIUM LASER APPLICATION Left 03/13/2021   Procedure: HOLMIUM LASER APPLICATION;  Surgeon: Sebastian Ache, MD;  Location: [facility];  Service: Urology;  Laterality: Left;   IR CATHETER TUBE CHANGE  02/04/2019   IR CATHETER TUBE CHANGE  02/04/2019   IR CATHETER TUBE CHANGE  02/11/2019   IR RADIOLOGIST EVAL & MGMT  03/09/2019   JOINT REPLACEMENT     LUMBAR FUSION  JULY 2013   NEPHROLITHOTOMY  X2 YRS AGO   PERCUTANEOUS NEPHROSTOMY  12/2018   ROBOT ASSISTED LAPAROSCOPIC NEPHRECTOMY Right 03/23/2020   Procedure: XI ROBOTIC ASSISTED LAPAROSCOPIC NEPHRECTOMY;  Surgeon: Sebastian Ache, MD;  Location: [facility];  Service: Urology;  Laterality: Right;   TOTAL ABDOMINAL HYSTERECTOMY W/ BILATERAL SALPINGOOPHORECTOMY  1989   TOTAL KNEE ARTHROPLASTY Bilateral 1992  &  1996   TRANSTHORACIC ECHOCARDIOGRAM  07-24-2017   dr Bing Matter   ef 60-65% (improved from last echo 2014, was 45-50%)/  trace TR/  mild AV sclerosis without stenosis   URETEROSCOPY Right 11/21/2013   Procedure: URETEROSCOPY WITH STENT REMOVAL AND REPLACEMENT;  Surgeon: Kathi Ludwig, MD;  Location: [facility];   Service: Urology;  Laterality: Right;     Home Medications:  Prior to Admission medications   Medication Sig Start Date End Date Taking? Authorizing Provider  acetaminophen (TYLENOL) 500 MG tablet Take 1,000 mg by mouth every 6 (six) hours as needed for moderate pain or headache.     [provider]  albuterol (VENTOLIN HFA) 108 (90 Base) MCG/ACT inhaler Inhale 2 puffs into the lungs every 4 (four) hours as needed for wheezing or shortness of breath. 01/11/24   Prescilla Sours, FNP  amitriptyline (ELAVIL) 25 MG tablet Take 25 mg by mouth at bedtime.    [provider]  apixaban (ELIQUIS) 2.5 MG TABS tablet Take 1 tablet (2.5 mg total) by mouth 2 (two) times daily. 01/12/24   Georgeanna Lea, MD  flecainide (TAMBOCOR) 100 MG tablet Take 1 tablet (100 mg total) by mouth 2 (two) times daily. 02/01/24   Georgeanna Lea, MD  hydroxyurea (HYDREA) 500 MG capsule TAKE ONE CAPSULE BY MOUTH EVERY MONDAY, WEDNESDAY, AND FRIDAY FOR ELEVATED PLATELETS 12/28/23   Mosher, Harvin Hazel A, PA-C  omeprazole (PRILOSEC) 40 MG capsule Take 40 mg by mouth daily.     [provider]  ondansetron (ZOFRAN-ODT) 4 MG disintegrating tablet Take 4 mg by mouth every 6 (six) hours as needed for nausea/vomiting. 11/16/18   [provider]  promethazine (PHENERGAN) 25 MG tablet Take 25 mg by mouth every 8 (eight) hours as needed for nausea or vomiting. 03/26/21   [provider]  Spacer/Aero-Holding Chambers (COMPACT SPACE CHAMBER) DEVI Use with the albuterol inhaler 01/11/24   Prescilla Sours, FNP  triamcinolone cream (KENALOG) 0.1 % Apply 1 application topically 2 (two) times daily as needed (eczema).    [provider]    Inpatient Medications: Scheduled Meds:  amiodarone  150 mg Intravenous Once   Continuous Infusions:  amiodarone     Followed by   amiodarone     magnesium sulfate bolus IVPB     sodium chloride     PRN Meds:   Allergies:    Allergies  Allergen  Reactions   Codeine Nausea Only and Other (See Comments)    hallucinations   Hydrocodone Nausea Only and Other (See Comments)    Social History:   Social History   Socioeconomic History   Marital  status: Married    Spouse name: Not on file   Number of children: Not on file   Years of education: Not on file   Highest education level: Not on file  Occupational History   Occupation: Retired from [employer]  Tobacco Use   Smoking status: Never   Smokeless tobacco: Never  Vaping Use   Vaping status: Never Used  Substance and Sexual Activity   Alcohol use: No   Drug use: No   Sexual activity: Not on file  Other Topics Concern   Not on file  Social History Narrative   Lives in [place] with husband. Drives, walks some, but not very active at home.    Social Drivers of Corporate investment banker Strain: Not on file  Food Insecurity: Not on file  Transportation Needs: Not on file  Physical Activity: Not on file  Stress: Not on file  Social Connections: Not on file  Intimate Partner Violence: Not on file    Family History:    Family History  Problem Relation Age of Onset   Heart disease Mother    Heart failure Mother    Cancer Mother    Heart disease Father    Heart failure Father      ROS:  Please see the history of present illness.   All other ROS reviewed and negative.     Physical Exam/Data:   Vitals:   02/11/24 1415 02/11/24 1418  BP: (!) 76/62 (!) 98/53  Pulse: 89 87  Resp: (!) 21 14  Temp: 98.3 F (36.8 C)   TempSrc: Oral   SpO2: 99% 99%   No intake or output data in the 24 hours ending 02/11/24 1448    01/06/2024   10:20 AM 09/08/2023   10:30 AM 08/28/2023    3:08 PM  Last 3 Weights  Weight (lbs) 182 lb 182 lb 183 lb  Weight (kg) 82.555 kg 82.555 kg 83.008 kg     There is no height or weight on file to calculate BMI.  General:  elderly female in NAD HEENT: normal Neck: no JVD Vascular: No carotid bruits; Distal pulses 2+  bilaterally Cardiac:  normal S1, S2; RRR; no murmur  Lungs:  clear to auscultation bilaterally, no wheezing, rhonchi or rales  Abd: soft, nontender, no hepatomegaly  Ext: no edema Musculoskeletal:  No deformities, BUE and BLE strength normal and equal Skin: warm and dry  Neuro:  CNs 2-12 intact, no focal abnormalities noted Psych:  Normal affect   EKG:  The EKG was personally reviewed and demonstrates:  wide complex tachycardia, follow up strips appear to be sinus rhythm HR 79 with IVCD Telemetry:  Telemetry was personally reviewed and demonstrates:  appears to have first degree heart block, IVCD, and intermittent wide complex tachycardia, episodes of irregular rhythm likely consistent with Afib  Relevant CV Studies:  Echo 08/2022:  1. Left ventricular ejection fraction, by estimation, is 60 to 65%. The  left ventricle has normal function. The left ventricle has no regional  wall motion abnormalities. There is mild concentric left ventricular  hypertrophy. Left ventricular diastolic  parameters are consistent with Grade I diastolic dysfunction (impaired  relaxation). The average left ventricular global longitudinal strain is  -17.1 %.   2. Right ventricular systolic function is normal. The right ventricular  size is normal.   3. The mitral valve is normal in structure. No evidence of mitral valve  regurgitation. No evidence of mitral stenosis.   4. The aortic valve is  tricuspid. Aortic valve regurgitation is not  visualized. No aortic stenosis is present.   5. Aortic dilated DTA 30 mm. There is mild dilatation of the ascending  aorta, measuring 38 mm.   6. The inferior vena cava is normal in size with greater than 50%  respiratory variability, suggesting right atrial pressure of 3 mmHg.   Laboratory Data:  Radiology/Studies:  No results found.   Assessment and Plan:   NSVT vs Atrial fibrillation with RVR Sinus tachycardia PAF Hx of high PVC burden - has been maintaining  sinus rhythm on 100 mg flecainide, not on BB - given concern for NSVT, IV amiodarone initiated -  will need to hold flecainide with amiodarone on board - telemetry now with coarse Afib rate controlled - troponin 1228, delta pending - Mg 1.7, will order another 2 g IV - add TSH with next lab draw   Hypotension - has received 1 L IVF   Chronic anticoagulation - hold eliquis for surgery - heparin gtt this evening - if surgery agrees   AKI Solitary kidney - sCr 3.0 - likely related to poor PO intake x 3 days; however, given shaking chills and flank pain three days ago, would rule out obstructive stone and UTI   Leukocytosis, question sepsis - WBC 23, UA pending - ?response to field reduction vs ongoing illness with recent chills, flank pain, nausea, diarrhea, and weakness   Preoperative risk stratification prior to ORIF for ankle surgery She does not have a history of PCI or heart failure requiring scheduled diuretic. She has a creatinine of 3.00. From a cardiovascular standpoint, she has a 0.9% risk of MACE prior to any procedure. She is unable to complete more than 4.0 METS. Will obtain echocardiogram for risk stratification. Suspect possible infection will need to be worked up as well as AKI prior to surgery. Elevated troponin in the absence of chest pain likely related to tachycardia, AKI, and orthopedic injury, however may have underlying CAD. She is at least moderate risk for any procedure.     Risk Assessment/Risk Scores:     CHA2DS2-VASc Score = 5   This indicates a 7.2% annual risk of stroke. The patient's score is based upon: CHF History: 1 HTN History: 1 Diabetes History: 0 Stroke History: 0 Vascular Disease History: 0 Age Score: 2 Gender Score: 1         For questions or updates, please contact [facility] Please consult [email or web address] for contact info under    Signed, Marcelino Duster, PA  02/11/2024 2:48 PM   I have personally seen and  examined the patient.  My HPI, Exam, and assessment and plan are below, independent of the NPP above.  Mrs. Moncivais is a 83 year old with PAF and PVCs (her flecainide indication) who presents with acute kidney injury and possible infection.  She has experienced a month of infectious sequelae, including bronchitis, leading to a slow deterioration in her health. This has been accompanied by decreased oral intake and failure to thrive. She also experienced a fall and leukocytosis.  She experienced an acute change in her condition starting around midnight on Tuesday, with symptoms of fever, chills, night sweats, and rigors. She also notes weakness and an acute change in her condition. Prior to this, she felt well enough to consider going out to dinner.  She presents with a creatinine level of 3 in the context of having a solitary kidney. Her baseline creatinine is normal, indicating acute kidney injury.  She PVCs  and has tolerated flecainide without issue. However, she is currently experiencing runs of wide complex tachycardia, which is morphologically different from her baseline narrow complex rhythm. She is compliant with her medications, including flecainide 100 mg BID and Eliquis, which are currently being held.   Exam notable for  Gen:  Ill appearing female with rigors Neck: No JVD Cardiac: No Rubs or Gallops, no murmur, RRR +2 Respiratory: Clear to auscultation bilaterally, normal effort, normal  respiratory rate GI: Soft, nontender, non-distended  MS: Right leg well cast and wrapped. Neuro:  At time of evaluation, alert and oriented to person/place/time/situation  Psych: Normal affect, patient feels poorly   EKG: sinus rhythm, first-degree heart block (baseline)  WCT- rate 130 with rare P waves concerning for NSVT. SR with TWI.  Tele: SR with runs of polmorphic and monomorphic VT   Chemistry Recent Labs  Lab 02/11/24 1442 02/11/24 1449  NA 135 138  K 4.3 4.0  CL 103 106  CO2  20*  --   GLUCOSE 97 90  BUN 43* 38*  CREATININE 3.10* 3.00*  CALCIUM 8.2*  --   PROT 5.7*  --   ALBUMIN 2.5*  --   AST 30  --   ALT 11  --   ALKPHOS 64  --   BILITOT 0.8  --   GFRNONAA 15*  --   ANIONGAP 12  --      Hematology Recent Labs  Lab 02/11/24 1442 02/11/24 1449  WBC 23.7*  --   RBC 5.09  --   HGB 14.2 15.3*  HCT 46.1* 45.0  MCV 90.6  --   MCH 27.9  --   MCHC 30.8  --   RDW 15.9*  --   PLT 360  --      In assessment and plan:   NSTEMI Presents with NSTEMI in the setting of leukocytosis and failure to thrive. Troponin elevation present. EKG shows sinus rhythm with first-degree heart block, her baseline, and new T wave inversions. Differential includes stress-induced cardiomyopathy or LAD disease. Not a candidate for left heart catheterization until kidney function is optimized due to creatinine of 3 save for emergency; this may be a demand NSTEMI from sepsis or stress inducec cardiomyopathy. Discussed with family about possible ischemic testing if AKI resolves. - Switch Eliquis to heparin. - Hold flecainide. - continue IV amiodarone. - Echocardiogram is ordered. - Consider potential code sepsis (chills, hypotension, WBC elevation, recent infection per history) - Consider ischemic testing if AKI resolves  Nonsustained Ventricular Tachycardia PAF- (history, I do not believe ECG here is truly AF) Experiencing runs of wide complex tachycardia, morphologically different from her narrow complex rhythm, and irregular in nature. Concerning for polymorphic nonsustained ventricular tachycardia, possibly related to flecainide toxicity, although she is compliant with her medications. Rhythm is concerning, and IV amiodarone is recommended. - Hold flecainide. - Continue IV amiodarone.  Acute Kidney Injury (AKI) AKI with creatinine of 3 in the setting of a solitary kidney. Baseline creatinine is normal. AKI needs workup. Not a candidate for left heart catheterization until  kidney function is optimized.   Preoperative risk - if patient needs emergent surgery reasonable to proceed- we discussed with family - for elective surgery patient may be best served by infectious work up and BP stabilization; will continue to follow with you   Riley Lam, MD FASE [facility] Cardiologist [facility]  [address], [address] [place], [place] [address] [phone]  5:12 PM

## 2024-02-11 NOTE — ED Provider Notes (Signed)
 [place] [facility] AT [facility] Provider Note   CSN: [id number] Arrival date & time: 02/11/24  1356     History  Chief Complaint  Patient presents with   Fall    On thinners    [name] [name] is a 83 y.o. female.  This is an 83 year old female history of atrial fibrillation on Eliquis presenting emergency department after falling in her driveway.  She has not been feeling well with nausea vomiting diarrhea for the past 3 days.  While walking on driveway the legs became weak and she fell backwards striking her head.  Right lower extremity fell underneath her, deformity to leg.  No numbness tingling changes in sensation.  No other injuries.   Fall       Home Medications Prior to Admission medications   Medication Sig Start Date End Date Taking? Authorizing Provider  acetaminophen (TYLENOL) 500 MG tablet Take 1,000 mg by mouth every 6 (six) hours as needed for moderate pain or headache.     [provider]  albuterol (VENTOLIN HFA) 108 (90 Base) MCG/ACT inhaler Inhale 2 puffs into the lungs every 4 (four) hours as needed for wheezing or shortness of breath. 01/11/24   Prescilla Sours, FNP  amitriptyline (ELAVIL) 25 MG tablet Take 25 mg by mouth at bedtime.    [provider]  apixaban (ELIQUIS) 2.5 MG TABS tablet Take 1 tablet (2.5 mg total) by mouth 2 (two) times daily. 01/12/24   Georgeanna Lea, MD  flecainide (TAMBOCOR) 100 MG tablet Take 1 tablet (100 mg total) by mouth 2 (two) times daily. 02/01/24   Georgeanna Lea, MD  hydroxyurea (HYDREA) 500 MG capsule TAKE ONE CAPSULE BY MOUTH EVERY MONDAY, WEDNESDAY, AND FRIDAY FOR ELEVATED PLATELETS 12/28/23   Mosher, Harvin Hazel A, PA-C  omeprazole (PRILOSEC) 40 MG capsule Take 40 mg by mouth daily.     [provider]  ondansetron (ZOFRAN-ODT) 4 MG disintegrating tablet Take 4 mg by mouth every 6 (six) hours as needed for nausea/vomiting. 11/16/18   [provider]   promethazine (PHENERGAN) 25 MG tablet Take 25 mg by mouth every 8 (eight) hours as needed for nausea or vomiting. 03/26/21   [provider]  Spacer/Aero-Holding Chambers (COMPACT SPACE CHAMBER) DEVI Use with the albuterol inhaler 01/11/24   Prescilla Sours, FNP  triamcinolone cream (KENALOG) 0.1 % Apply 1 application topically 2 (two) times daily as needed (eczema).    [provider]      Allergies    Codeine and Hydrocodone    Review of Systems   Review of Systems  Physical Exam Updated Vital Signs BP 110/73   Pulse 78   Temp 98.3 F (36.8 C) (Oral)   Resp (!) 21   SpO2 98%  Physical Exam Vitals and nursing note reviewed.  Constitutional:      General: She is not in acute distress.    Appearance: She is not toxic-appearing.  HENT:     Head: Normocephalic.     Nose: Nose normal.     Mouth/Throat:     Mouth: Mucous membranes are dry.  Eyes:     Conjunctiva/sclera: Conjunctivae normal.  Cardiovascular:     Rate and Rhythm: Tachycardia present. Rhythm irregular.  Pulmonary:     Effort: Pulmonary effort is normal.     Breath sounds: Normal breath sounds.  Abdominal:     General: Abdomen is flat. There is no distension.     Palpations: Abdomen is soft.  Tenderness: There is no abdominal tenderness. There is no guarding or rebound.  Musculoskeletal:     Comments: Deformity right lower extremity.  2+ DP pulses.  Normal sensation.  Chest wall stable nontender.  Pelvis stable nontender.  No other bony abnormalities.  5 of 5 bicep strength tricep strength.  Skin:    General: Skin is warm.     Capillary Refill: Capillary refill takes less than 2 seconds.  Neurological:     Mental Status: She is alert and oriented to person, place, and time.  Psychiatric:        Mood and Affect: Mood normal.        Behavior: Behavior normal.        Thought Content: Thought content normal.     ED Results / Procedures / Treatments   Labs (all labs ordered are listed, but  only abnormal results are displayed) Labs Reviewed  COMPREHENSIVE METABOLIC PANEL WITH GFR - Abnormal; Notable for the following components:      Result Value   CO2 20 (*)    BUN 43 (*)    Creatinine, Ser 3.10 (*)    Calcium 8.2 (*)    Total Protein 5.7 (*)    Albumin 2.5 (*)    GFR, Estimated 15 (*)    All other components within normal limits  CBC - Abnormal; Notable for the following components:   WBC 23.7 (*)    HCT 46.1 (*)    RDW 15.9 (*)    All other components within normal limits  PROTIME-INR - Abnormal; Notable for the following components:   Prothrombin Time 20.7 (*)    INR 1.8 (*)    All other components within normal limits  I-STAT CHEM 8, ED - Abnormal; Notable for the following components:   BUN 38 (*)    Creatinine, Ser 3.00 (*)    Calcium, Ion 1.02 (*)    TCO2 18 (*)    Hemoglobin 15.3 (*)    All other components within normal limits  TROPONIN I (HIGH SENSITIVITY) - Abnormal; Notable for the following components:   Troponin I (High Sensitivity) 1,228 (*)    All other components within normal limits  MAGNESIUM  URINALYSIS, ROUTINE W REFLEX MICROSCOPIC  TSH  I-STAT CG4 LACTIC ACID, ED  TYPE AND SCREEN  TROPONIN I (HIGH SENSITIVITY)    EKG EKG Interpretation Date/Time:  Thursday February 11 2024 15:04:03 EDT Ventricular Rate:  79 PR Interval:  186 QRS Duration:  117 QT Interval:  521 QTC Calculation: 598 R Axis:   48  Text Interpretation: Sinus rhythm Nonspecific intraventricular conduction delay Low voltage, extremity and precordial leads Abnormal T, probable ischemia, widespread Confirmed by Elise Guile [phone]) on 02/11/2024 3:37:17 PM  Radiology DG Ankle 2 Views Right Result Date: 02/11/2024 CLINICAL DATA:  Trauma, fall. EXAM: RIGHT ANKLE - 2 VIEW COMPARISON:  None Available. FINDINGS: There is an acute oblique mildly comminuted fracture of the distal tibial diaphysis. There is 1 cm of posterior distraction of the distal fracture fragment with mild  apex medial angulation. There is also an acute comminuted fracture of the distal fibular diaphysis at the same level with apex medial angulation. There is no definite dislocation, although evaluation is limited secondary to patient positioning. There is soft tissue swelling surrounding the fractures stray IMPRESSION: Acute comminuted fractures of the distal tibial and fibular diaphyses with apex medial angulation. Electronically Signed   By: Tyron Gallon M.D.   On: 02/11/2024 16:33   DG Chest Portable 1  View Result Date: 02/11/2024 CLINICAL DATA:  Status post fall on blood thinners EXAM: PORTABLE CHEST 1 VIEW COMPARISON:  Chest radiograph dated 01/11/2024 FINDINGS: Low lung volumes with bronchovascular crowding. Left basilar patchy opacities. No pleural effusion or pneumothorax. The heart size and mediastinal contours are within normal limits. Questionable transversely oriented lucency through the left lateral fifth rib. IMPRESSION: 1. Low lung volumes with bronchovascular crowding. Left basilar patchy opacities, likely atelectasis. 2. Questionable nondisplaced left lateral fifth rib fracture. Recommend correlation with point tenderness. Electronically Signed   By: Agustin Cree M.D.   On: 02/11/2024 16:32    Procedures Procedures    Medications Ordered in ED Medications  amiodarone (NEXTERONE) 1.8 mg/mL load via infusion 150 mg (150 mg Intravenous Bolus from Bag 02/11/24 1446)    Followed by  amiodarone (NEXTERONE PREMIX) 360-4.14 MG/200ML-% (1.8 mg/mL) IV infusion (60 mg/hr Intravenous New Bag/Given 02/11/24 1448)    Followed by  amiodarone (NEXTERONE PREMIX) 360-4.14 MG/200ML-% (1.8 mg/mL) IV infusion (has no administration in time range)  magnesium sulfate IVPB 2 g 50 mL (has no administration in time range)  sodium chloride 0.9 % bolus 1,000 mL (0 mLs Intravenous Stopped 02/11/24 1550)  magnesium sulfate IVPB 2 g 50 mL (0 g Intravenous Stopped 02/11/24 1550)  fentaNYL (SUBLIMAZE) injection 25 mcg  (25 mcg Intravenous Given 02/11/24 1536)    ED Course/ Medical Decision Making/ A&P Clinical Course as of 02/11/24 1636  Thu Feb 11, 2024  1437 Patient appears to be having frequent runs of nonsustained V. tach.  Blood pressure remains soft, but not having lightheadedness, chest pain, shortness of breath. [TY]  1444 Echo 2023: "IMPRESSIONS     1. Left ventricular ejection fraction, by estimation, is 60 to 65%. The  left ventricle has normal function. The left ventricle has no regional  wall motion abnormalities. There is mild concentric left ventricular "  [TY]  1508 Potassium: 4.0 [TY]    Clinical Course User Index [TY] Coral Spikes, DO                                 Medical Decision Making This is an 83 year old female presenting emergency department as a trauma activation after a fall while on thinners.  She did fall and hit her head, has deformity to her right lower extremity.  EMS reported giving nitrous oxide due to soft blood pressures.  She is tachycardic, appears to be in atrial fibrillation; however they have had nonsustained episodes of V. tach versus atrial flutter with aberrancy.  Started on amiodarone bolus and drip.  IV fluids given her nausea vomiting diarrhea as I suspect she is volume down, clinically appears dry.  Ortho consulting, for tibia-fibula fracture.  Ordering CT scan will likely need operative fixation.  Will get CT head to evaluate for acute traumatic hemorrhage.  Plan for admission for patient's A-fib versus V. tach with near syncopal episode as well as her tibiofibular fracture.  Amount and/or Complexity of Data Reviewed Independent Historian:     Details: Family note patient has not felt well for the past several days with nausea vomiting diarrhea.  No abdominal pain.  Benign abdominal exam. External Data Reviewed:     Details: History of A-fib on flecainide as well as Eliquis. Labs: ordered. Decision-making details documented in ED Course.    Details:  No significant metabolic derangements on her i-STAT, benign abdominal exam.  No transaminitis on her CMP.  Does have minor elevation in her BUN and creatinine. Radiology: ordered.    Details: Tibia-fibula with obvious fracture.  CT head pending ECG/medicine tests: ordered.    Details: Does not have ST segment changes that would indicate ischemia, appears to be A-fib; but does have what appears to be nonsustained V. tach.  Had several episodes of V. tach on the monitor lasting a few beats to up to several minutes.  Not having chest pain, no shortness of breath no lightheadedness or palpitations. Discussion of management or test interpretation with external provider(s): Case discussed with cardiology will see patient.  Risk Prescription drug management. Parenteral controlled substances. Decision regarding hospitalization. Risk Details: Patient was given fentanyl for pain control.         Final Clinical Impression(s) / ED Diagnoses Final diagnoses:  None    Rx / DC Orders ED Discharge Orders     None         Rolinda Climes, DO 02/11/24 1636

## 2024-02-11 NOTE — ED Notes (Signed)
 Pt c/o nausea, and dry heaving into emesis bag. Pt monitor showing narrow complex tachycardia into the 140's/160's. Pt remains A&Ox4. Dr. Nolia Baumgartner made aware.

## 2024-02-11 NOTE — ED Notes (Signed)
 Pt having multiple runs of vtach. Pt denies dizziness, cp, ha, at this time. "Just sleepy." Dr. Linder Revere notifed,  Pt placed on zole defibrillator, second IV obtained. New orders placed.

## 2024-02-11 NOTE — ED Notes (Signed)
See Mar

## 2024-02-11 NOTE — Progress Notes (Signed)
 PHARMACY - ANTICOAGULATION CONSULT NOTE  Pharmacy Consult for heparin Indication: chest pain/ACS  Allergies  Allergen Reactions   Codeine Nausea Only and Other (See Comments)    hallucinations   Hydrocodone Nausea Only and Other (See Comments)    Patient Measurements:    Vital Signs: Temp: 98.3 F (36.8 C) (04/17 1415) Temp Source: Oral (04/17 1415) BP: 101/56 (04/17 1820) Pulse Rate: 75 (04/17 1820)  Labs: Recent Labs    02/11/24 1442 02/11/24 1449  HGB 14.2 15.3*  HCT 46.1* 45.0  PLT 360  --   LABPROT 20.7*  --   INR 1.8*  --   CREATININE 3.10* 3.00*  TROPONINIHS 1,228*  --     CrCl cannot be calculated (Unknown ideal weight.).   Medical History: Past Medical History:  Diagnosis Date   Abdominal aortic atherosclerosis (HCC) 05/07/2016   Noted on CT abd/pelvis   Acute renal failure (HCC) 06/04/2013   Acute respiratory failure with hypoxia (HCC) 06/04/2013   Anemia, unspecified 06/08/2013   Anticoagulant long-term use    ELIQUIS   Cancer (HCC)    uterine cancer    Chronic anticoagulation 08/04/2017   Chronic diastolic (congestive) heart failure (HCC)     pt denies    Chronic diastolic congestive heart failure (HCC) 05/31/2015   Chronic kidney disease 2014   arf on dialysis in icu   Complicated UTI (urinary tract infection) 03/31/2021   Complication of anesthesia    2021- kidney removed at [facility] pt reports did not get enough med to wake her up and she could not talk and then recovered after given more of wake up med from anesthesia    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 Senaida Ores [facility] in [place])  pt uses pessary   Diarrhea 06/11/2013   Diverticulosis 05/07/2016   Noted on CT abd/pelvis   Dyslipidemia 05/31/2015   E coli bacteremia 06/08/2013   Encounter for monitoring flecainide therapy 07/30/2016   Essential thrombocythemia (HCC) 03/04/2021    Female genital prolapse 04/15/2016   First degree heart block    General weakness    GERD (gastroesophageal reflux disease)    Hematuria    intermittently due to ureter right stent   Hiatal hernia    High risk medication use 04/15/2016   History of arteriovenostomy for renal dialysis [facility])    History of colon polyps    History of gastric polyp    History of kidney infection    12-31-2018  perinephrtic abscess  right side  s/p drains x2 01-10-2019,  05/ 2020 drains removed   History of kidney stones    History of peptic ulcer disease    History of septic shock    2011 (pt unaware) and 06-04-2013  w/ acute renal failure   History of uterine cancer    STAGE I  --  S/P TAH W/ BSO  (NO OTHER TX)   History of ventilator dependency 2014   in setting of septic shock   Hydronephrosis 05/07/2016   Hyperlipemia 04/15/2016   Hyperlipidemia    Hypertension    Iron deficiency anemia hematology/ oncology--- dr Melvyn Neth at [facility] in [place]--- per lov note 08-07-2017  stabilized and felt to be more chronic anemia   01/ 2018  dx iron def. anemia  s/p  IV Iron infusion and taking oral iron supplement   Lactic acidosis 06/04/2013   Lumbar compression fracture (  HCC)    01-22-2019 per imaging L1  (06-01-2019 per pt currently no pain)   Midline cystocele 04/15/2016   Nonfunctioning kidney 03/23/2020   NSTEMI, initial episode of care [facility]) 06/07/2013   pt denies    Occlusion of ureteral stent (HCC)    PAF (paroxysmal atrial fibrillation) (HCC) DX JUNE 2012   CARDIOLOGIST--  DR Parks Bollman--- [facility])  CHADS2   Palpitations 04/15/2016   Paroxysmal atrial fibrillation (HCC) 08/04/2017   Presence of pessary    Pyelonephritis 06/04/2013   Pyelonephritis of left kidney 06/04/2013   Right wrist fracture    per pt fractured on 03-10-2019,  had closed reduction and wearing a brace   Sepsis (HCC) 05/07/2016   Septic shock (HCC) 06/04/2013   IMO SNOMED Dx  Update Oct 2024     Septic shock(785.52) 06/04/2013   Tendonitis, Achilles 04/15/2016   Ureteral obstruction, right 06/04/2013   Ureteral stricture, right urologist-- dr Secundino Dach   Chronic--- treated with ureteral stent   Urinary tract infection without hematuria    Uses walker    and wheelchair for longer distance   Ventricular ectopy 12/28/2020   Wears glasses     Medications:  (Not in a hospital admission)  Scheduled:   [START ON 02/12/2024] pantoprazole  40 mg Oral Daily   sodium chloride flush  3 mL Intravenous Q12H    Assessment: 11 YOF presented with possible NSTEMI. Past medical history significant for atrial fibrillation and reports taking Eliquis prior to arrival, last dose taken at 4/17 AM. Patient being transitioned to heparin for possible ischemic work-up. Pharmacy consulted for heparin dosing. Due to recent Eliquis administration, will monitor aPTT and heparin levels until correlating.   4/17: Hgb 15.3, PLT 360  Goal of Therapy:  Heparin level 0.3-0.7 units/ml aPTT 66-102 seconds Monitor platelets by anticoagulation protocol: Yes   Plan:  Heparin 1000 units/hr - no bolus due to recent Eliquis administration Monitor aPTT and heparin level in 8 hours Monitor aPTT and heparin level daily until correlating Monitor CBC and s/sx of bleeding F/u [facility] plans  Volney Grumbles, PharmD PGY-1 Acute Care Pharmacy Resident 02/11/2024 7:34 PM

## 2024-02-11 NOTE — ED Notes (Signed)
 EKG obtained. Dr. Nolia Baumgartner at bedside.

## 2024-02-11 NOTE — H&P (View-Only) (Signed)
 Reason for Consult:Right ankle fx Referring Physician: Elise Guile Time called: 1446 Time at bedside: 1500   [name] [name] is an 83 y.o. female.  HPI: [name] [name] was trying to get in a car when she lost her balance and fell. She had immediate right ankle pain and a horrific deformity. She was brought to the ED where x-rays showed a distal tib/fib fx and orthopedic surgery was consulted. She was reduced in the field. She has been going in and out of a tachyarrhythmia since arrival.  Past Medical History:  Diagnosis Date   Abdominal aortic atherosclerosis (HCC) 05/07/2016   Noted on CT abd/pelvis   Acute renal failure (HCC) 06/04/2013   Acute respiratory failure with hypoxia (HCC) 06/04/2013   Anemia, unspecified 06/08/2013   Anticoagulant long-term use    ELIQUIS   Cancer (HCC)    uterine cancer    Chronic anticoagulation 08/04/2017   Chronic diastolic (congestive) heart failure (HCC)     pt denies    Chronic diastolic congestive heart failure (HCC) 05/31/2015   Chronic kidney disease 2014   arf on dialysis in icu   Complicated UTI (urinary tract infection) 03/31/2021   Complication of anesthesia    2021- kidney removed at [facility] pt reports did not get enough med to wake her up and she could not talk and then recovered after given more of wake up med from anesthesia    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 Wayna Hails [facility] in [place])  pt uses pessary   Diarrhea 06/11/2013   Diverticulosis 05/07/2016   Noted on CT abd/pelvis   Dyslipidemia 05/31/2015   E coli bacteremia 06/08/2013   Encounter for monitoring flecainide therapy 07/30/2016   Essential thrombocythemia (HCC) 03/04/2021   Female genital prolapse 04/15/2016   First degree heart block    General weakness    GERD (gastroesophageal reflux disease)    Hematuria    intermittently due to ureter right stent   Hiatal  hernia    High risk medication use 04/15/2016   History of arteriovenostomy for renal dialysis [facility])    History of colon polyps    History of gastric polyp    History of kidney infection    12-31-2018  perinephrtic abscess  right side  s/p drains x2 01-10-2019,  05/ 2020 drains removed   History of kidney stones    History of peptic ulcer disease    History of septic shock    2011 (pt unaware) and 06-04-2013  w/ acute renal failure   History of uterine cancer    STAGE I  --  S/P TAH W/ BSO  (NO OTHER TX)   History of ventilator dependency 2014   in setting of septic shock   Hydronephrosis 05/07/2016   Hyperlipemia 04/15/2016   Hyperlipidemia    Hypertension    Iron deficiency anemia hematology/ oncology--- dr Harles Lied at [facility] in [place]--- per lov note 08-07-2017  stabilized and felt to be more chronic anemia   01/ 2018  dx iron def. anemia  s/p  IV Iron infusion and taking oral iron supplement   Lactic acidosis 06/04/2013   Lumbar compression fracture (HCC)    01-22-2019 per imaging L1  (06-01-2019 per pt currently no pain)   Midline cystocele 04/15/2016   Nonfunctioning kidney 03/23/2020   NSTEMI, initial episode of care [facility]) 06/07/2013   pt denies    Occlusion  of ureteral stent (HCC)    PAF (paroxysmal atrial fibrillation) (HCC) DX JUNE 2012   CARDIOLOGIST--  DR Parks Bollman--- [place]  [facility])  CHADS2   Palpitations 04/15/2016   Paroxysmal atrial fibrillation (HCC) 08/04/2017   Presence of pessary    Pyelonephritis 06/04/2013   Pyelonephritis of left kidney 06/04/2013   Right wrist fracture    per pt fractured on 03-10-2019,  had closed reduction and wearing a brace   Sepsis (HCC) 05/07/2016   Septic shock (HCC) 06/04/2013   IMO SNOMED Dx Update Oct 2024     Septic shock(785.52) 06/04/2013   Tendonitis, Achilles 04/15/2016   Ureteral obstruction, right 06/04/2013   Ureteral stricture, right urologist-- dr Secundino Dach   Chronic---  treated with ureteral stent   Urinary tract infection without hematuria    Uses walker    and wheelchair for longer distance   Ventricular ectopy 12/28/2020   Wears glasses     Past Surgical History:  Procedure Laterality Date   APPENDECTOMY     BACK SURGERY     CARDIOVERSION  09/ 2018   dr Gordan Latina   successful ([facility] )   CHOLECYSTECTOMY  2010   COLONOSCOPY W/ POLYPECTOMY  05/2017   CYSTO/  BALLOON DILATION RIGHT URETERAL STRICTURE/ STENT PLACEMENT  06-02-2013   CYSTO/  BILATERAL RETROGRADE PYELOGRAM/ RIGHT URETEROSCOPY AND STENT PLACEMENT  10-04-2005   CYSTO/ LEFT URETEROSCOPIC STONE EXTRACTION  04-03-2006   CYSTOSCOPY W/ RETROGRADES Right 01/03/2019   Procedure: CYSTOSCOPY WITH RETROGRADE PYELOGRAM RIGHT WITH STENT EXCHANGE;  Surgeon: Homero Luster, MD;  Location: [facility];  Service: Urology;  Laterality: Right;   CYSTOSCOPY W/ URETERAL STENT PLACEMENT Right 02/06/2014   Procedure: CYSTOSCOPY WITH RIGHT RETROGRADE PYELOGRAM RIGHT STENT REMOVAL and REPLACEMENT, Right Ureteroscopy;  Surgeon: Edmund Gouge, MD;  Location: [facility];  Service: Urology;  Laterality: Right;   CYSTOSCOPY W/ URETERAL STENT PLACEMENT Right 11/26/2016   Procedure: CYSTOSCOPY WITH RETROGRADE PYELOGRAM/URETERAL STENT REPLACEMENT;  Surgeon: Osborn Blaze, MD;  Location: [facility];  Service: Urology;  Laterality: Right;   CYSTOSCOPY W/ URETERAL STENT PLACEMENT Right 04/24/2017   Procedure: CYSTOSCOPY WITH RETROGRADE PYELOGRAM/URETERAL STENT REPLACEMENT;  Surgeon: Osborn Blaze, MD;  Location: [facility];  Service: Urology;  Laterality: Right;   CYSTOSCOPY W/ URETERAL STENT PLACEMENT Right 10/07/2017   Procedure: CYSTOSCOPY WITH RETROGRADE PYELOGRAM/URETERAL STENT EXCHANGE;  Surgeon: Osborn Blaze, MD;  Location: [facility];  Service: Urology;  Laterality: Right;   CYSTOSCOPY W/ URETERAL STENT PLACEMENT Right 03/03/2018   Procedure: CYSTOSCOPY WITH  RIGHT RETROGRADE / RIGHT URETERAL STENT EXCHANGE;  Surgeon: Osborn Blaze, MD;  Location: [facility];  Service: Urology;  Laterality: Right;   CYSTOSCOPY W/ URETERAL STENT PLACEMENT Right 09/22/2018   Procedure: CYSTOSCOPY WITH RETROGRADE PYELOGRAM/URETERAL STENT PLACEMENT;  Surgeon: Osborn Blaze, MD;  Location: [facility];  Service: Urology;  Laterality: Right;  45 MINS   CYSTOSCOPY W/ URETERAL STENT PLACEMENT Right 06/03/2019   Procedure: CYSTOSCOPY WITH RETROGRADE PYELOGRAM/URETERAL STENT REPLACEMENT;  Surgeon: Osborn Blaze, MD;  Location: [facility];  Service: Urology;  Laterality: Right;   CYSTOSCOPY W/ URETERAL STENT PLACEMENT Right 11/23/2019   Procedure: CYSTOSCOPY WITH RETROGRADE PYELOGRAM/URETERAL STENT PLACEMENT;  Surgeon: Osborn Blaze, MD;  Location: [facility];  Service: Urology;  Laterality: Right;  45 MINS   CYSTOSCOPY WITH LITHOLAPAXY N/A 11/21/2013   Procedure: CYSTOSCOPY WITH LITHOLAPAXY;  Surgeon: Edmund Gouge, MD;  Location[facility];  Service:  Urology;  Laterality: N/A;   CYSTOSCOPY WITH RETROGRADE PYELOGRAM, URETEROSCOPY AND STENT PLACEMENT Bilateral 03/15/2014   Procedure: CYSTOSCOPY WITH BILATERAL RETROGRADE PYELOGRAM, RIGHT DIAGNOSTIC URETEROSCOPY AND STENT EXCHANGE;  Surgeon: Osborn Blaze, MD;  Location: [facility];  Service: Urology;  Laterality: Bilateral;   CYSTOSCOPY WITH RETROGRADE PYELOGRAM, URETEROSCOPY AND STENT PLACEMENT Left 03/13/2021   Procedure: FIRST STAGE: CYSTOSCOPY WITH RETROGRADE PYELOGRAM, URETEROSCOPY AND STENT PLACEMENT;  Surgeon: Osborn Blaze, MD;  Location: [facility];  Service: Urology;  Laterality: Left;  75 MINS   CYSTOSCOPY WITH RETROGRADE PYELOGRAM, URETEROSCOPY AND STENT PLACEMENT Left 03/29/2021   Procedure: SECOND STGAE:CYSTOSCOPY WITH RETROGRADE PYELOGRAM, URETEROSCOPY AND STENT EXCHANGE;  Surgeon: Osborn Blaze, MD;  Location: [facility];  Service: Urology;  Laterality: Left;    CYSTOSCOPY WITH STENT PLACEMENT Right 05/07/2016   Procedure: CYSTOSCOPY WITH RETROGRADE PYELOGRAM, URETERAL STENT PLACEMENT;  Surgeon: Trent Frizzle, MD;  Location: [facility];  Service: Urology;  Laterality: Right;   EYE SURGERY     Bilateral cataract removal   HEMIARTHROPLASTY HIP Right 11/2016   fractured   HOLMIUM LASER APPLICATION Left 03/13/2021   Procedure: HOLMIUM LASER APPLICATION;  Surgeon: Osborn Blaze, MD;  Location: [facility];  Service: Urology;  Laterality: Left;   IR CATHETER TUBE CHANGE  02/04/2019   IR CATHETER TUBE CHANGE  02/04/2019   IR CATHETER TUBE CHANGE  02/11/2019   IR RADIOLOGIST EVAL & MGMT  03/09/2019   JOINT REPLACEMENT     LUMBAR FUSION  JULY 2013   NEPHROLITHOTOMY  X2 YRS AGO   PERCUTANEOUS NEPHROSTOMY  12/2018   ROBOT ASSISTED LAPAROSCOPIC NEPHRECTOMY Right 03/23/2020   Procedure: XI ROBOTIC ASSISTED LAPAROSCOPIC NEPHRECTOMY;  Surgeon: Osborn Blaze, MD;  Location: [facility];  Service: Urology;  Laterality: Right;   TOTAL ABDOMINAL HYSTERECTOMY W/ BILATERAL SALPINGOOPHORECTOMY  1989   TOTAL KNEE ARTHROPLASTY Bilateral 1992  &  1996   TRANSTHORACIC ECHOCARDIOGRAM  07-24-2017   dr Gordan Latina   ef 60-65% (improved from last echo 2014, was 45-50%)/  trace TR/  mild AV sclerosis without stenosis   URETEROSCOPY Right 11/21/2013   Procedure: URETEROSCOPY WITH STENT REMOVAL AND REPLACEMENT;  Surgeon: Edmund Gouge, MD;  Location: [facility];  Service: Urology;  Laterality: Right;    Family History  Problem Relation Age of Onset   Heart disease Mother    Heart failure Mother    Cancer Mother    Heart disease Father    Heart failure Father     Social History:  reports that she has never smoked. She has never used smokeless tobacco. She reports that she does not drink alcohol and does not use drugs.  Allergies:  Allergies  Allergen Reactions   Codeine Nausea Only and Other (See Comments)    hallucinations   Hydrocodone Nausea Only and  Other (See Comments)    Medications: I have reviewed the patient's current medications.  Results for orders placed or performed during the hospital encounter of 02/11/24 (from the past 48 hours)  I-Stat Chem 8, ED     Status: Abnormal   Collection Time: 02/11/24  2:49 PM  Result Value Ref Range   Sodium 138 135 - 145 mmol/L   Potassium 4.0 3.5 - 5.1 mmol/L   Chloride 106 98 - 111 mmol/L   BUN 38 (H) 8 - 23 mg/dL   Creatinine, Ser 1.61 (H) 0.44 - 1.00 mg/dL   Glucose, Bld 90 70 - 99 mg/dL    Comment: Glucose reference range applies only to samples taken  after fasting for at least 8 hours.   Calcium, Ion 1.02 (L) 1.15 - 1.40 mmol/L   TCO2 18 (L) 22 - 32 mmol/L   Hemoglobin 15.3 (H) 12.0 - 15.0 g/dL   HCT 16.1 09.6 - 04.5 %  I-Stat Lactic Acid, ED     Status: None   Collection Time: 02/11/24  2:50 PM  Result Value Ref Range   Lactic Acid, Venous 1.7 0.5 - 1.9 mmol/L    No results found.  Review of Systems  HENT:  Negative for ear discharge, ear pain, hearing loss and tinnitus.   Eyes:  Negative for photophobia and pain.  Respiratory:  Negative for cough and shortness of breath.   Cardiovascular:  Negative for chest pain.  Gastrointestinal:  Negative for abdominal pain, nausea and vomiting.  Genitourinary:  Negative for dysuria, flank pain, frequency and urgency.  Musculoskeletal:  Positive for arthralgias (Right ankle). Negative for back pain, myalgias and neck pain.  Neurological:  Negative for dizziness and headaches.  Hematological:  Does not bruise/bleed easily.  Psychiatric/Behavioral:  The patient is not nervous/anxious.    Blood pressure (!) 98/53, pulse 87, temperature 98.3 F (36.8 C), temperature source Oral, resp. rate 14, SpO2 (P) 99%. Physical Exam Constitutional:      General: She is not in acute distress.    Appearance: She is well-developed. She is not diaphoretic.  HENT:     Head: Normocephalic and atraumatic.  Eyes:     General: No scleral icterus.        Right eye: No discharge.        Left eye: No discharge.     Conjunctiva/sclera: Conjunctivae normal.  Cardiovascular:     Rate and Rhythm: Regular rhythm. Tachycardia present.  Pulmonary:     Effort: Pulmonary effort is normal. No respiratory distress.  Musculoskeletal:     Cervical back: Normal range of motion.     Comments: RLE No traumatic wounds, ecchymosis, or rash  Severe ankle TTP  No knee effusion  Knee stable to varus/ valgus and anterior/posterior stress  Sens DPN, SPN, TN intact  Motor EHL 5/5  DP 1+, PT 1+, No significant edema  Skin:    General: Skin is warm and dry.  Neurological:     Mental Status: She is alert.  Psychiatric:        Mood and Affect: Mood normal.        Behavior: Behavior normal.     Assessment/Plan: Right ankle fx -- Will get CT and splint. Probable ORIF tomorrow in cleared medically. NWB for now.    Georganna Kin, PA-C Orthopedic Surgery [phone] 02/11/2024, 3:05 PM

## 2024-02-11 NOTE — ED Notes (Signed)
 Pt in [place] with a pulse. Dr. Linder Revere at bedside. Iv fluid bolus hung, and infusing

## 2024-02-11 NOTE — Progress Notes (Signed)
 Orthopedic Tech Progress Note Patient Details:  Reilyn Nelson [facility] 1941-03-09 [id number]  Ortho Devices Type of Ortho Device: Stirrup splint, Short leg splint Ortho Device/Splint Location: RLE Ortho Device/Splint Interventions: Ordered, Application, Adjustment   Post Interventions Patient Tolerated: Well  Herbie Loll 02/11/2024, 3:53 PM

## 2024-02-11 NOTE — Progress Notes (Signed)
 Orthopedic Tech Progress Note Patient Details:  Shan Padgett [facility] 01-11-1941 [id number]  Patient ID: Torianne Laflam [facility], female   DOB: [birth year], 83 y.o.   MRN: [id number] Level 2 trauma? RLE injury Herbie Loll 02/11/2024, 2:36 PM

## 2024-02-11 NOTE — Consult Note (Signed)
 Reason for Consult:Right ankle fx Referring Physician: Elise Guile Time called: 1446 Time at bedside: 1500   [name] [name] is an 83 y.o. female.  HPI: Drucilla was trying to get in a car when she lost her balance and fell. She had immediate right ankle pain and a horrific deformity. She was brought to the ED where x-rays showed a distal tib/fib fx and orthopedic surgery was consulted. She was reduced in the field. She has been going in and out of a tachyarrhythmia since arrival.  Past Medical History:  Diagnosis Date   Abdominal aortic atherosclerosis (HCC) 05/07/2016   Noted on CT abd/pelvis   Acute renal failure (HCC) 06/04/2013   Acute respiratory failure with hypoxia (HCC) 06/04/2013   Anemia, unspecified 06/08/2013   Anticoagulant long-term use    ELIQUIS   Cancer (HCC)    uterine cancer    Chronic anticoagulation 08/04/2017   Chronic diastolic (congestive) heart failure (HCC)     pt denies    Chronic diastolic congestive heart failure (HCC) 05/31/2015   Chronic kidney disease 2014   arf on dialysis in icu   Complicated UTI (urinary tract infection) 03/31/2021   Complication of anesthesia    2021- kidney removed at [facility] pt reports did not get enough med to wake her up and she could not talk and then recovered after given more of wake up med from anesthesia    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 Wayna Hails [facility] in [place])  pt uses pessary   Diarrhea 06/11/2013   Diverticulosis 05/07/2016   Noted on CT abd/pelvis   Dyslipidemia 05/31/2015   E coli bacteremia 06/08/2013   Encounter for monitoring flecainide therapy 07/30/2016   Essential thrombocythemia (HCC) 03/04/2021   Female genital prolapse 04/15/2016   First degree heart block    General weakness    GERD (gastroesophageal reflux disease)    Hematuria    intermittently due to ureter right stent   Hiatal  hernia    High risk medication use 04/15/2016   History of arteriovenostomy for renal dialysis [facility])    History of colon polyps    History of gastric polyp    History of kidney infection    12-31-2018  perinephrtic abscess  right side  s/p drains x2 01-10-2019,  05/ 2020 drains removed   History of kidney stones    History of peptic ulcer disease    History of septic shock    2011 (pt unaware) and 06-04-2013  w/ acute renal failure   History of uterine cancer    STAGE I  --  S/P TAH W/ BSO  (NO OTHER TX)   History of ventilator dependency 2014   in setting of septic shock   Hydronephrosis 05/07/2016   Hyperlipemia 04/15/2016   Hyperlipidemia    Hypertension    Iron deficiency anemia hematology/ oncology--- dr Harles Lied at [facility] in [place]--- per lov note 08-07-2017  stabilized and felt to be more chronic anemia   01/ 2018  dx iron def. anemia  s/p  IV Iron infusion and taking oral iron supplement   Lactic acidosis 06/04/2013   Lumbar compression fracture (HCC)    01-22-2019 per imaging L1  (06-01-2019 per pt currently no pain)   Midline cystocele 04/15/2016   Nonfunctioning kidney 03/23/2020   NSTEMI, initial episode of care [facility]) 06/07/2013   pt denies    Occlusion  of ureteral stent (HCC)    PAF (paroxysmal atrial fibrillation) (HCC) DX JUNE 2012   CARDIOLOGIST--  DR Parks Bollman--- [place]  [facility])  CHADS2   Palpitations 04/15/2016   Paroxysmal atrial fibrillation (HCC) 08/04/2017   Presence of pessary    Pyelonephritis 06/04/2013   Pyelonephritis of left kidney 06/04/2013   Right wrist fracture    per pt fractured on 03-10-2019,  had closed reduction and wearing a brace   Sepsis (HCC) 05/07/2016   Septic shock (HCC) 06/04/2013   IMO SNOMED Dx Update Oct 2024     Septic shock(785.52) 06/04/2013   Tendonitis, Achilles 04/15/2016   Ureteral obstruction, right 06/04/2013   Ureteral stricture, right urologist-- dr Secundino Dach   Chronic---  treated with ureteral stent   Urinary tract infection without hematuria    Uses walker    and wheelchair for longer distance   Ventricular ectopy 12/28/2020   Wears glasses     Past Surgical History:  Procedure Laterality Date   APPENDECTOMY     BACK SURGERY     CARDIOVERSION  09/ 2018   dr Gordan Latina   successful ([facility] )   CHOLECYSTECTOMY  2010   COLONOSCOPY W/ POLYPECTOMY  05/2017   CYSTO/  BALLOON DILATION RIGHT URETERAL STRICTURE/ STENT PLACEMENT  06-02-2013   CYSTO/  BILATERAL RETROGRADE PYELOGRAM/ RIGHT URETEROSCOPY AND STENT PLACEMENT  10-04-2005   CYSTO/ LEFT URETEROSCOPIC STONE EXTRACTION  04-03-2006   CYSTOSCOPY W/ RETROGRADES Right 01/03/2019   Procedure: CYSTOSCOPY WITH RETROGRADE PYELOGRAM RIGHT WITH STENT EXCHANGE;  Surgeon: Homero Luster, MD;  Location: [facility];  Service: Urology;  Laterality: Right;   CYSTOSCOPY W/ URETERAL STENT PLACEMENT Right 02/06/2014   Procedure: CYSTOSCOPY WITH RIGHT RETROGRADE PYELOGRAM RIGHT STENT REMOVAL and REPLACEMENT, Right Ureteroscopy;  Surgeon: Edmund Gouge, MD;  Location: [facility];  Service: Urology;  Laterality: Right;   CYSTOSCOPY W/ URETERAL STENT PLACEMENT Right 11/26/2016   Procedure: CYSTOSCOPY WITH RETROGRADE PYELOGRAM/URETERAL STENT REPLACEMENT;  Surgeon: Osborn Blaze, MD;  Location: [facility];  Service: Urology;  Laterality: Right;   CYSTOSCOPY W/ URETERAL STENT PLACEMENT Right 04/24/2017   Procedure: CYSTOSCOPY WITH RETROGRADE PYELOGRAM/URETERAL STENT REPLACEMENT;  Surgeon: Osborn Blaze, MD;  Location: [facility];  Service: Urology;  Laterality: Right;   CYSTOSCOPY W/ URETERAL STENT PLACEMENT Right 10/07/2017   Procedure: CYSTOSCOPY WITH RETROGRADE PYELOGRAM/URETERAL STENT EXCHANGE;  Surgeon: Osborn Blaze, MD;  Location: [facility];  Service: Urology;  Laterality: Right;   CYSTOSCOPY W/ URETERAL STENT PLACEMENT Right 03/03/2018   Procedure: CYSTOSCOPY WITH  RIGHT RETROGRADE / RIGHT URETERAL STENT EXCHANGE;  Surgeon: Osborn Blaze, MD;  [facility];  Service: Urology;  Laterality: Right;   CYSTOSCOPY W/ URETERAL STENT PLACEMENT Right 09/22/2018   Procedure: CYSTOSCOPY WITH RETROGRADE PYELOGRAM/URETERAL STENT PLACEMENT;  Surgeon: Osborn Blaze, MD;  Location: [facility];  Service: Urology;  Laterality: Right;  45 MINS   CYSTOSCOPY W/ URETERAL STENT PLACEMENT Right 06/03/2019   Procedure: CYSTOSCOPY WITH RETROGRADE PYELOGRAM/URETERAL STENT REPLACEMENT;  Surgeon: Osborn Blaze, MD;  Location: [facility];  Service: Urology;  Laterality: Right;   CYSTOSCOPY W/ URETERAL STENT PLACEMENT Right 11/23/2019   Procedure: CYSTOSCOPY WITH RETROGRADE PYELOGRAM/URETERAL STENT PLACEMENT;  Surgeon: Osborn Blaze, MD;  Location: [facility];  Service: Urology;  Laterality: Right;  45 MINS   CYSTOSCOPY WITH LITHOLAPAXY N/A 11/21/2013   Procedure: CYSTOSCOPY WITH LITHOLAPAXY;  Surgeon: Edmund Gouge, MD;  Location: [facility];  Service:  Urology;  Laterality: N/A;   CYSTOSCOPY WITH RETROGRADE PYELOGRAM, URETEROSCOPY AND STENT PLACEMENT Bilateral 03/15/2014   Procedure: CYSTOSCOPY WITH BILATERAL RETROGRADE PYELOGRAM, RIGHT DIAGNOSTIC URETEROSCOPY AND STENT EXCHANGE;  Surgeon: Osborn Blaze, MD;  [facility];  Service: Urology;  Laterality: Bilateral;   CYSTOSCOPY WITH RETROGRADE PYELOGRAM, URETEROSCOPY AND STENT PLACEMENT Left 03/13/2021   Procedure: FIRST STAGE: CYSTOSCOPY WITH RETROGRADE PYELOGRAM, URETEROSCOPY AND STENT PLACEMENT;  Surgeon: Osborn Blaze, MD;  Location[facility];  Service: Urology;  Laterality: Left;  75 MINS   CYSTOSCOPY WITH RETROGRADE PYELOGRAM, URETEROSCOPY AND STENT PLACEMENT Left 03/29/2021   Procedure: SECOND STGAE:CYSTOSCOPY WITH RETROGRADE PYELOGRAM, URETEROSCOPY AND STENT EXCHANGE;  Surgeon: Osborn Blaze, MD;  [facility];  Service: Urology;  Laterality: Left;    CYSTOSCOPY WITH STENT PLACEMENT Right 05/07/2016   Procedure: CYSTOSCOPY WITH RETROGRADE PYELOGRAM, URETERAL STENT PLACEMENT;  Surgeon: Trent Frizzle, MD;  [facility];  Service: Urology;  Laterality: Right;   EYE SURGERY     Bilateral cataract removal   HEMIARTHROPLASTY HIP Right 11/2016   fractured   HOLMIUM LASER APPLICATION Left 03/13/2021   Procedure: HOLMIUM LASER APPLICATION;  Surgeon: Osborn Blaze, MD;  Location[facility];  Service: Urology;  Laterality: Left;   IR CATHETER TUBE CHANGE  02/04/2019   IR CATHETER TUBE CHANGE  02/04/2019   IR CATHETER TUBE CHANGE  02/11/2019   IR RADIOLOGIST EVAL & MGMT  03/09/2019   JOINT REPLACEMENT     LUMBAR FUSION  JULY 2013   NEPHROLITHOTOMY  X2 YRS AGO   PERCUTANEOUS NEPHROSTOMY  12/2018   ROBOT ASSISTED LAPAROSCOPIC NEPHRECTOMY Right 03/23/2020   Procedure: XI ROBOTIC ASSISTED LAPAROSCOPIC NEPHRECTOMY;  Surgeon: Osborn Blaze, MD;  [facility];  Service: Urology;  Laterality: Right;   TOTAL ABDOMINAL HYSTERECTOMY W/ BILATERAL SALPINGOOPHORECTOMY  1989   TOTAL KNEE ARTHROPLASTY Bilateral 1992  &  1996   TRANSTHORACIC ECHOCARDIOGRAM  07-24-2017   dr Gordan Latina   ef 60-65% (improved from last echo 2014, was 45-50%)/  trace TR/  mild AV sclerosis without stenosis   URETEROSCOPY Right 11/21/2013   Procedure: URETEROSCOPY WITH STENT REMOVAL AND REPLACEMENT;  Surgeon: Edmund Gouge, MD;  Location: [facility];  Service: Urology;  Laterality: Right;    Family History  Problem Relation Age of Onset   Heart disease Mother    Heart failure Mother    Cancer Mother    Heart disease Father    Heart failure Father     Social History:  reports that she has never smoked. She has never used smokeless tobacco. She reports that she does not drink alcohol and does not use drugs.  Allergies:  Allergies  Allergen Reactions   Codeine Nausea Only and Other (See Comments)    hallucinations   Hydrocodone Nausea Only and  Other (See Comments)    Medications: I have reviewed the patient's current medications.  Results for orders placed or performed during the hospital encounter of 02/11/24 (from the past 48 hours)  I-Stat Chem 8, ED     Status: Abnormal   Collection Time: 02/11/24  2:49 PM  Result Value Ref Range   Sodium 138 135 - 145 mmol/L   Potassium 4.0 3.5 - 5.1 mmol/L   Chloride 106 98 - 111 mmol/L   BUN 38 (H) 8 - 23 mg/dL   Creatinine, Ser 1.61 (H) 0.44 - 1.00 mg/dL   Glucose, Bld 90 70 - 99 mg/dL    Comment: Glucose reference range applies only to samples taken  after fasting for at least 8 hours.   Calcium, Ion 1.02 (L) 1.15 - 1.40 mmol/L   TCO2 18 (L) 22 - 32 mmol/L   Hemoglobin 15.3 (H) 12.0 - 15.0 g/dL   HCT 16.1 09.6 - 04.5 %  I-Stat Lactic Acid, ED     Status: None   Collection Time: 02/11/24  2:50 PM  Result Value Ref Range   Lactic Acid, Venous 1.7 0.5 - 1.9 mmol/L    No results found.  Review of Systems  HENT:  Negative for ear discharge, ear pain, hearing loss and tinnitus.   Eyes:  Negative for photophobia and pain.  Respiratory:  Negative for cough and shortness of breath.   Cardiovascular:  Negative for chest pain.  Gastrointestinal:  Negative for abdominal pain, nausea and vomiting.  Genitourinary:  Negative for dysuria, flank pain, frequency and urgency.  Musculoskeletal:  Positive for arthralgias (Right ankle). Negative for back pain, myalgias and neck pain.  Neurological:  Negative for dizziness and headaches.  Hematological:  Does not bruise/bleed easily.  Psychiatric/Behavioral:  The patient is not nervous/anxious.    Blood pressure (!) 98/53, pulse 87, temperature 98.3 F (36.8 C), temperature source Oral, resp. rate 14, SpO2 (P) 99%. Physical Exam Constitutional:      General: She is not in acute distress.    Appearance: She is well-developed. She is not diaphoretic.  HENT:     Head: Normocephalic and atraumatic.  Eyes:     General: No scleral icterus.        Right eye: No discharge.        Left eye: No discharge.     Conjunctiva/sclera: Conjunctivae normal.  Cardiovascular:     Rate and Rhythm: Regular rhythm. Tachycardia present.  Pulmonary:     Effort: Pulmonary effort is normal. No respiratory distress.  Musculoskeletal:     Cervical back: Normal range of motion.     Comments: RLE No traumatic wounds, ecchymosis, or rash  Severe ankle TTP  No knee effusion  Knee stable to varus/ valgus and anterior/posterior stress  Sens DPN, SPN, TN intact  Motor EHL 5/5  DP 1+, PT 1+, No significant edema  Skin:    General: Skin is warm and dry.  Neurological:     Mental Status: She is alert.  Psychiatric:        Mood and Affect: Mood normal.        Behavior: Behavior normal.     Assessment/Plan: Right ankle fx -- Will get CT and splint. Probable ORIF tomorrow in cleared medically. NWB for now.    Georganna Kin, PA-C Orthopedic Surgery [phone] 02/11/2024, 3:05 PM

## 2024-02-12 ENCOUNTER — Encounter (HOSPITAL_COMMUNITY): Payer: Self-pay | Admitting: Family Medicine

## 2024-02-12 ENCOUNTER — Inpatient Hospital Stay (HOSPITAL_COMMUNITY)

## 2024-02-12 DIAGNOSIS — I4729 Other ventricular tachycardia: Secondary | ICD-10-CM | POA: Diagnosis not present

## 2024-02-12 DIAGNOSIS — I5021 Acute systolic (congestive) heart failure: Secondary | ICD-10-CM

## 2024-02-12 LAB — CBC
HCT: 40 % (ref 36.0–46.0)
Hemoglobin: 12.6 g/dL (ref 12.0–15.0)
MCH: 28.6 pg (ref 26.0–34.0)
MCHC: 31.5 g/dL (ref 30.0–36.0)
MCV: 90.9 fL (ref 80.0–100.0)
Platelets: 348 10*3/uL (ref 150–400)
RBC: 4.4 MIL/uL (ref 3.87–5.11)
RDW: 15.9 % — ABNORMAL HIGH (ref 11.5–15.5)
WBC: 18.9 10*3/uL — ABNORMAL HIGH (ref 4.0–10.5)
nRBC: 0 % (ref 0.0–0.2)

## 2024-02-12 LAB — ECHOCARDIOGRAM COMPLETE
AR max vel: 2.81 cm2
AV Area VTI: 2.9 cm2
AV Area mean vel: 2.8 cm2
AV Mean grad: 3 mmHg
AV Peak grad: 5.9 mmHg
Ao pk vel: 1.21 m/s
Area-P 1/2: 4.06 cm2
Height: 67 in
S' Lateral: 3.3 cm
Weight: 3015.89 [oz_av]

## 2024-02-12 LAB — APTT
aPTT: 83 s — ABNORMAL HIGH (ref 24–36)
aPTT: 52 s — ABNORMAL HIGH (ref 24–36)

## 2024-02-12 LAB — MAGNESIUM: Magnesium: 2.9 mg/dL — ABNORMAL HIGH (ref 1.7–2.4)

## 2024-02-12 LAB — BASIC METABOLIC PANEL WITH GFR
Anion gap: 8 (ref 5–15)
BUN: 38 mg/dL — ABNORMAL HIGH (ref 8–23)
CO2: 20 mmol/L — ABNORMAL LOW (ref 22–32)
Calcium: 8.1 mg/dL — ABNORMAL LOW (ref 8.9–10.3)
Chloride: 107 mmol/L (ref 98–111)
Creatinine, Ser: 2.24 mg/dL — ABNORMAL HIGH (ref 0.44–1.00)
GFR, Estimated: 22 mL/min — ABNORMAL LOW (ref >60)
Glucose, Bld: 123 mg/dL — ABNORMAL HIGH (ref 70–99)
Potassium: 4.6 mmol/L (ref 3.5–5.1)
Sodium: 135 mmol/L (ref 135–145)

## 2024-02-12 LAB — HEPARIN LEVEL (UNFRACTIONATED): Heparin Unfractionated: >1.1 [IU]/mL — ABNORMAL HIGH (ref 0.30–0.70)

## 2024-02-12 MED ORDER — PERFLUTREN LIPID MICROSPHERE
1.0000 mL | INTRAVENOUS | Status: AC | PRN
  Administered 2024-02-12: 2 mL via INTRAVENOUS

## 2024-02-12 NOTE — Progress Notes (Signed)
 PHARMACY - ANTICOAGULATION CONSULT NOTE  Pharmacy Consult for heparin  Indication: chest pain/ACS  Allergies  Allergen Reactions   Codeine Nausea Only and Other (See Comments)    hallucinations   Hydrocodone  Nausea Only and Other (See Comments)    Patient Measurements: Height: 5\' 7"  (170.2 cm) Weight: 82.1 kg (181 lb) IBW/kg (Calculated) : 61.6 HEPARIN  DW (KG): 78.5  Vital Signs: Temp: 98.5 F (36.9 C) (04/18 0514) Temp Source: Oral (04/18 0514) BP: 107/57 (04/18 0545) Pulse Rate: 77 (04/18 0545)  Labs: Recent Labs    02/11/24 1442 02/11/24 1449 02/11/24 1809 02/12/24 0601 02/12/24 0602  HGB 14.2 15.3*  --   --  12.6  HCT 46.1* 45.0  --   --  40.0  PLT 360  --   --   --  348  APTT  --   --   --   --  83*  LABPROT 20.7*  --   --   --   --   INR 1.8*  --   --   --   --   HEPARINUNFRC  --   --   --  >1.10*  --   CREATININE 3.10* 3.00*  --   --  2.24*  TROPONINIHS 1,228*  --  755*  --   --     Estimated Creatinine Clearance: 21.7 mL/min (A) (by C-G formula based on SCr of 2.24 mg/dL (H)).  Assessment: 68 YOF presented with possible NSTEMI. Past medical history significant for atrial fibrillation and reports taking Eliquis  prior to arrival, last dose taken at 4/17 AM. Patient being transitioned to heparin  for possible ischemic work-up. Pharmacy consulted for heparin  dosing. Due to recent Eliquis  administration, will monitor aPTT and heparin  levels until correlating.   Initial heparin  level is elevated as expected but aPTT is therapeutic. No bleeding noted.   Goal of Therapy:  Heparin  level 0.3-0.7 units/ml aPTT 66-102 seconds Monitor platelets by anticoagulation protocol: Yes   Plan:  Continue heparin  gtt 1000 units/hr Confirm dosing with afternoon aPTT Daily heparin  level, CBC and aPTT  Jerri Morale, PharmD, BCPS, BCEMP Clinical Pharmacist Please see AMION for all pharmacy numbers 02/12/2024 7:23 AM

## 2024-02-12 NOTE — Progress Notes (Signed)
 Progress Note  Patient Name: [name] [name] [facility] Date of Encounter: 02/12/2024 Primary Cardiologist: Ralene Burger, MD   Subjective   Overnight WBC improved, creatinine improved. Patient notes no CP, SOB, Palpitations.  Vital Signs    Vitals:   02/12/24 0700 02/12/24 0715 02/12/24 0730 02/12/24 0733  BP: 102/60 109/73 (!) 98/59   Pulse: 76 77 74   Resp: 20 (!) 21 18   Temp:    98.6 F (37 C)  TempSrc:    Oral  SpO2: 99% 100% 100%   Weight:      Height:       No intake or output data in the 24 hours ending 02/12/24 0748 Filed Weights   02/11/24 1910 02/11/24 2025  Weight: 82.5 kg 82.1 kg    Physical Exam   GEN: No acute distress.  No further rigors Neck: No JVD Cardiac: RRR, no murmurs, rubs, or gallops.  Respiratory: Clear to auscultation bilaterally. GI: Soft, nontender, non-distended  MS: Well wrapped R ankle  Labs  EKG: SR with abnormal TWI in the anterior precordium Telemetry: SR with rare PVCs   Chemistry Recent Labs  Lab 02/11/24 1442 02/11/24 1449 02/12/24 0602  NA 135 138 135  K 4.3 4.0 4.6  CL 103 106 107  CO2 20*  --  20*  GLUCOSE 97 90 123*  BUN 43* 38* 38*  CREATININE 3.10* 3.00* 2.24*  CALCIUM  8.2*  --  8.1*  PROT 5.7*  --   --   ALBUMIN 2.5*  --   --   AST 30  --   --   ALT 11  --   --   ALKPHOS 64  --   --   BILITOT 0.8  --   --   GFRNONAA 15*  --  22*  ANIONGAP 12  --  8     Hematology Recent Labs  Lab 02/11/24 1442 02/11/24 1449 02/12/24 0602  WBC 23.7*  --  18.9*  RBC 5.09  --  4.40  HGB 14.2 15.3* 12.6  HCT 46.1* 45.0 40.0  MCV 90.6  --  90.9  MCH 27.9  --  28.6  MCHC 30.8  --  31.5  RDW 15.9*  --  15.9*  PLT 360  --  348    Cardiac Studies   Cardiac Studies & Procedures   ______________________________________________________________________________________________     ECHOCARDIOGRAM  ECHOCARDIOGRAM COMPLETE 08/27/2022  Narrative ECHOCARDIOGRAM REPORT    Patient Name:   MISCHA BRITTINGHAM [facility] Date  of Exam: 08/27/2022 Medical Rec #:  [id number]         Height:       67.0 in Accession #:    [id number]        Weight:       186.4 lb Date of Birth:  Mar 06, 1941         BSA:          1.963 m Patient Age:    83 years          BP:           136/72 mmHg Patient Gender: F                 HR:           72 bpm. Exam Location:  [facility]  Procedure: 2D Echo, Cardiac Doppler, Color Doppler and Strain Analysis  Indications:    Dyspnea on exertion [R06.09 (ICD-10-CM)]  History:        Patient has prior history of  Echocardiogram examinations, most recent 05/24/2021. CHF, CAD, Arrythmias:Atrial Fibrillation; Risk Factors:Dyslipidemia and Hypertension.  Sonographer:    Erminia Hazel RDCS Referring Phys: [id number] ROBERT J KRASOWSKI  IMPRESSIONS   1. Left ventricular ejection fraction, by estimation, is 60 to 65%. The left ventricle has normal function. The left ventricle has no regional wall motion abnormalities. There is mild concentric left ventricular hypertrophy. Left ventricular diastolic parameters are consistent with Grade I diastolic dysfunction (impaired relaxation). The average left ventricular global longitudinal strain is -17.1 %. 2. Right ventricular systolic function is normal. The right ventricular size is normal. 3. The mitral valve is normal in structure. No evidence of mitral valve regurgitation. No evidence of mitral stenosis. 4. The aortic valve is tricuspid. Aortic valve regurgitation is not visualized. No aortic stenosis is present. 5. Aortic dilated DTA 30 mm. There is mild dilatation of the ascending aorta, measuring 38 mm. 6. The inferior vena cava is normal in size with greater than 50% respiratory variability, suggesting right atrial pressure of 3 mmHg.  FINDINGS Left Ventricle: Left ventricular ejection fraction, by estimation, is 60 to 65%. The left ventricle has normal function. The left ventricle has no regional wall motion abnormalities. The average left ventricular  global longitudinal strain is -17.1 %. The left ventricular internal cavity size was normal in size. There is mild concentric left ventricular hypertrophy. Left ventricular diastolic parameters are consistent with Grade I diastolic dysfunction (impaired relaxation). Normal left ventricular filling pressure.  Right Ventricle: The right ventricular size is normal. No increase in right ventricular wall thickness. Right ventricular systolic function is normal.  Left Atrium: Left atrial size was normal in size.  Right Atrium: Right atrial size was normal in size.  Pericardium: There is no evidence of pericardial effusion.  Mitral Valve: The mitral valve is normal in structure. No evidence of mitral valve regurgitation. No evidence of mitral valve stenosis.  Tricuspid Valve: The tricuspid valve is normal in structure. Tricuspid valve regurgitation is not demonstrated. No evidence of tricuspid stenosis.  Aortic Valve: The aortic valve is tricuspid. Aortic valve regurgitation is not visualized. No aortic stenosis is present.  Pulmonic Valve: The pulmonic valve was normal in structure. Pulmonic valve regurgitation is not visualized. No evidence of pulmonic stenosis.  Aorta: The aortic root is normal in size and structure and dilated DTA 30 mm. There is mild dilatation of the ascending aorta, measuring 38 mm.  Venous: The pulmonary veins were not well visualized. The inferior vena cava is normal in size with greater than 50% respiratory variability, suggesting right atrial pressure of 3 mmHg.  IAS/Shunts: No atrial level shunt detected by color flow Doppler.   LEFT VENTRICLE PLAX 2D LVIDd:         4.20 cm   Diastology LVIDs:         2.80 cm   LV e' medial:    9.19 cm/s LV PW:         1.20 cm   LV E/e' medial:  6.2 LV IVS:        1.20 cm   LV e' lateral:   7.88 cm/s LVOT diam:     1.80 cm   LV E/e' lateral: 7.2 LV SV:         60 LV SV Index:   30        2D Longitudinal Strain LVOT Area:      2.54 cm  2D Strain GLS Avg:     -17.1 %   RIGHT VENTRICLE  IVC RV Basal diam:  3.10 cm    IVC diam: 1.50 cm RV S prime:     9.46 cm/s TAPSE (M-mode): 2.1 cm  LEFT ATRIUM             Index        RIGHT ATRIUM           Index LA diam:        4.10 cm 2.09 cm/m   RA Area:     14.50 cm LA Vol (A2C):   43.8 ml 22.32 ml/m  RA Volume:   30.90 ml  15.74 ml/m LA Vol (A4C):   32.0 ml 16.30 ml/m LA Biplane Vol: 37.4 ml 19.06 ml/m AORTIC VALVE LVOT Vmax:   115.00 cm/s LVOT Vmean:  83.600 cm/s LVOT VTI:    0.235 m  AORTA Ao Root diam: 3.40 cm Ao Asc diam:  3.80 cm Ao Desc diam: 3.00 cm  MITRAL VALVE MV Area (PHT): 1.79 cm     SHUNTS MV Decel Time: 423 msec     Systemic VTI:  0.24 m MV E velocity: 56.90 cm/s   Systemic Diam: 1.80 cm MV A velocity: 107.00 cm/s MV E/A ratio:  0.53  Zoe Hinds MD Electronically signed by Zoe Hinds MD Signature Date/Time: 08/27/2022/12:24:16 PM    Final    MONITORS  LONG TERM MONITOR-LIVE TELEMETRY (3-14 DAYS) 12/04/2020  Narrative Winthrop Hawks Streety, DOB 1941/06/13, MRN [id number]  EVENT MONITOR REPORT:   Patient was monitored from 11/20/2020 to 11/27/2020. Indication:                    Paroxysmal atrial fibrillation Ordering physician: Dr. Ralene Burger MD Referring physician:  Dr. Ralene Burger MD   Baseline rhythm: Sinus  Minimum heart rate: 53 BPM.  Average heart rate: 70 BPM.  Maximal heart rate 108 BPM.  Atrial arrhythmia: Rare brief atrial runs.  Occasional PACs  Ventricular arrhythmia: Frequent PVCs ] 14.3%]  Conduction abnormality: None significant  Symptoms: Skipped beat and fluttering sensation   Conclusion: Mildly abnormal but overall unremarkable event monitor.  At times the patient had symptoms of palpitations and fluttering and did not correlate with any significant findings on the monitor.  At other times patient had PVCs and ventricular bigeminy when she experienced the symptoms. Interpreting   cardiologist: Nelia Balzarine, MD Date: 12/04/2020 11:39 AM       ______________________________________________________________________________________________        Assessment & Plan   NSVT Frequent PVCs (hx) Paroxsymal Atrial fibrillation (hx- largely SR with NSVT this admission) - on heparin ; can be stopped for urgent surgery - continue IV amiodarone ; Potential PO transition 02/13/24  NSTEMI AKI with solitary Kidney Pre-operative support (ankle) - Concerned for stress induced cardiomyopathy; echo is pending today - with volume resuscitation, AKI is improving - presently concerned this is demand ischemia; will need ischemic testing prior to return to flecainide   For questions or updates, please contact [facility] Please consult [email or web address] for contact info under Cardiology/STEMI.      Gloriann Larger, MD FASE [facility] Cardiologist [facility]  [address] [place], [address] [place], [place] [address] [phone]  7:48 AM

## 2024-02-12 NOTE — Progress Notes (Signed)
 OT Cancellation Note  Patient Details Name: YASLIN KIRTLEY MRN: [id number] DOB: July 31, 1941   Cancelled Treatment:    Reason Eval/Treat Not Completed: Medical issues which prohibited therapy.  Patient scheduled for lower leg ORIF.  Will hold and wait for post surgery orders.    Kyrese Gartman D Cayleen Benjamin 02/12/2024, 12:40 PM 02/12/2024  RP, OTR/L  Acute Rehabilitation Services  Office:  [phone]

## 2024-02-12 NOTE — Progress Notes (Addendum)
 PHARMACY - ANTICOAGULATION CONSULT NOTE  Pharmacy Consult for heparin  Indication: chest pain/ACS  Allergies  Allergen Reactions   Codeine Nausea Only and Other (See Comments)    hallucinations   Hydrocodone  Nausea Only and Other (See Comments)    Patient Measurements: Height: 5\' 7"  (170.2 cm) Weight: 82.1 kg (181 lb) IBW/kg (Calculated) : 61.6 HEPARIN  DW (KG): 78.5  Vital Signs: Temp: 98.5 F (36.9 C) (04/18 0514) Temp Source: Oral (04/18 0514) BP: 107/57 (04/18 0545) Pulse Rate: 77 (04/18 0545)  Labs: Recent Labs    02/11/24 1442 02/11/24 1449 02/11/24 1809 02/12/24 0601 02/12/24 0602  HGB 14.2 15.3*  --   --  12.6  HCT 46.1* 45.0  --   --  40.0  PLT 360  --   --   --  348  APTT  --   --   --   --  83*  LABPROT 20.7*  --   --   --   --   INR 1.8*  --   --   --   --   HEPARINUNFRC  --   --   --  >1.10*  --   CREATININE 3.10* 3.00*  --   --  2.24*  TROPONINIHS 1,228*  --  755*  --   --     Estimated Creatinine Clearance: 21.7 mL/min (A) (by C-G formula based on SCr of 2.24 mg/dL (H)).  Assessment: 68 YOF presented with possible NSTEMI. Past medical history significant for atrial fibrillation and reports taking Eliquis  prior to arrival, last dose taken at 4/17 AM. Patient being transitioned to heparin  for possible ischemic work-up. Pharmacy consulted for heparin  dosing. Due to recent Eliquis  administration, will monitor aPTT and heparin  levels until correlating.   Initial heparin  level is elevated as expected but aPTT is therapeutic. No bleeding noted.   Goal of Therapy:  Heparin  level 0.3-0.7 units/ml aPTT 66-102 seconds Monitor platelets by anticoagulation protocol: Yes   Plan:  Continue heparin  gtt 1000 units/hr Confirm dosing with afternoon aPTT Daily heparin  level, CBC and aPTT  Jerri Morale, PharmD, BCPS, BCEMP Clinical Pharmacist Please see AMION for all pharmacy numbers 02/12/2024 7:23 AM

## 2024-02-12 NOTE — Progress Notes (Signed)
 Progress Note   Patient: [name] [name] ZOX:[id number] DOB: 1941/07/18 DOA: 02/11/2024     1 DOS: the patient was seen and examined on 02/12/2024   Brief hospital course: 83 y.o. female with medical history significant for CKD 3B, nephrolithiasis, hyperlipidemia, hypertension, chronic HFpEF, thrombocytosis, first-degree heart block, and PAF on Eliquis  who presents with right ankle pain after a fall at home.   Patient had been in her usual state until 3 or 4 days ago when she developed left flank pain and then rigors.  She noted her systolic blood pressure to be in the low 80s the following day.  She has continued to feel poor in general with nausea, a couple episodes of vomiting, and some loose stools.  She then felt acutely weak while trying to get into a car at home, fell, and suffered a right ankle deformity with severe pain.   She denies chest pain, cough, shortness of breath, or leg swelling.   ED Course: Upon arrival to the ED, patient is found to be afebrile and saturating well on nasal cannula with transient tachycardia and hypotension.  There was concern for NSVT.  Labs were most notable for creatinine 3.10, WBC 23,700, normal lactic acid, and troponin 1228.  UA features bacteriuria, pyuria, and nitrites.  Imaging is most notable for acute fracture of the distal right tibia and fibula  Assessment and Plan: 1. NSTEMI - Cardiology following, suspected to be demand ischemia - Currently on IV heparin , trop trending down - Per Cardiology, would need ischemic testing prior to return to flecanide -2d echo reviewed. New EF of 25-30% with wall motion c/w possible Takosubo's cardiomyopathy vs CAD   2. NSVT; PAF   - Continue IV amiodarone , hold flecainide ,  -continue heparin  gtt     3. UTI   - Pt reports left flank pain and rigors and has bacteriuria, pyuria, and nitrites in urine  - f/u blood cultures -Will order urine culture -WBC improving   4. Right ankle fracture - Orthopedic  surgery following - Anticipate surgery when cleared by Cardiology, per above   5. AKI superimposed on CKD 3B  - Likely prerenal in setting of N/V/D and loss of appetite   - Continue IVF hydration -recheck bmet in AM   6. N/V/D  - continue with antiemetic as needed -Reported nausea this AM  7 Prolonged QTc -repeat EKG reviewed, resolved   Subjective: Denied chest pains this AM. Is reporting notable nausea this AM  Physical Exam: Vitals:   02/12/24 0733 02/12/24 0929 02/12/24 1157 02/12/24 1500  BP:  (!) 99/59 (!) 102/56 100/64  Pulse:  75 76 77  Resp:  18 16 16   Temp: 98.6 F (37 C) 98.3 F (36.8 C) 98.2 F (36.8 C) 98.8 F (37.1 C)  TempSrc: Oral Oral Oral Oral  SpO2:  100% 96% 96%  Weight:  85.5 kg    Height:  5\' 7"  (1.702 m)     General exam: Awake, laying in bed, in nad Respiratory system: Normal respiratory effort, no wheezing Cardiovascular system: regular rate, s1, s2 Gastrointestinal system: Soft, nondistended, positive BS Central nervous system: CN2-12 grossly intact, strength intact Extremities: Perfused, no clubbing Skin: Normal skin turgor, no notable skin lesions seen Psychiatry: Mood normal // no visual hallucinations   Data Reviewed:  Labs reviewed: Na 135, K 4.6, Cr 2.24, WBC 18.9, Hgb 12.6   Family Communication: Pt in room, family at bedside  Disposition: Status is: Inpatient Remains inpatient appropriate because: severity of illness  Planned Discharge Destination:  Unclear at this time    Author: Cherylle Corwin, MD 02/12/2024 5:56 PM  For on call review [email or web address].

## 2024-02-12 NOTE — Hospital Course (Signed)
 83 y.o. female with medical history significant for CKD 3B, nephrolithiasis, hyperlipidemia, hypertension, chronic HFpEF, thrombocytosis, first-degree heart block, and PAF on Eliquis  who presents with right ankle pain after a fall at home.   Patient had been in her usual state until 3 or 4 days ago when she developed left flank pain and then rigors.  She noted her systolic blood pressure to be in the low 80s the following day.  She has continued to feel poor in general with nausea, a couple episodes of vomiting, and some loose stools.  She then felt acutely weak while trying to get into a car at home, fell, and suffered a right ankle deformity with severe pain.   She denies chest pain, cough, shortness of breath, or leg swelling.   ED Course: Upon arrival to the ED, patient is found to be afebrile and saturating well on nasal cannula with transient tachycardia and hypotension.  There was concern for NSVT.  Labs were most notable for creatinine 3.10, WBC 23,700, normal lactic acid, and troponin 1228.  UA features bacteriuria, pyuria, and nitrites.  Imaging is most notable for acute fracture of the distal right tibia and fibula

## 2024-02-13 DIAGNOSIS — I4729 Other ventricular tachycardia: Secondary | ICD-10-CM | POA: Diagnosis not present

## 2024-02-13 DIAGNOSIS — I5021 Acute systolic (congestive) heart failure: Secondary | ICD-10-CM

## 2024-02-13 DIAGNOSIS — I214 Non-ST elevation (NSTEMI) myocardial infarction: Secondary | ICD-10-CM | POA: Diagnosis not present

## 2024-02-13 DIAGNOSIS — I480 Paroxysmal atrial fibrillation: Secondary | ICD-10-CM | POA: Diagnosis not present

## 2024-02-13 LAB — CBC
HCT: 39.1 % (ref 36.0–46.0)
Hemoglobin: 12.6 g/dL (ref 12.0–15.0)
MCH: 28.5 pg (ref 26.0–34.0)
MCHC: 32.2 g/dL (ref 30.0–36.0)
MCV: 88.5 fL (ref 80.0–100.0)
Platelets: 321 10*3/uL (ref 150–400)
RBC: 4.42 MIL/uL (ref 3.87–5.11)
RDW: 15.7 % — ABNORMAL HIGH (ref 11.5–15.5)
WBC: 13.6 10*3/uL — ABNORMAL HIGH (ref 4.0–10.5)
nRBC: 0 % (ref 0.0–0.2)

## 2024-02-13 LAB — BASIC METABOLIC PANEL WITH GFR
Anion gap: 9 (ref 5–15)
BUN: 33 mg/dL — ABNORMAL HIGH (ref 8–23)
CO2: 19 mmol/L — ABNORMAL LOW (ref 22–32)
Calcium: 8.1 mg/dL — ABNORMAL LOW (ref 8.9–10.3)
Chloride: 104 mmol/L (ref 98–111)
Creatinine, Ser: 1.51 mg/dL — ABNORMAL HIGH (ref 0.44–1.00)
GFR, Estimated: 35 mL/min — ABNORMAL LOW (ref >60)
Glucose, Bld: 85 mg/dL (ref 70–99)
Potassium: 4.1 mmol/L (ref 3.5–5.1)
Sodium: 132 mmol/L — ABNORMAL LOW (ref 135–145)

## 2024-02-13 LAB — MAGNESIUM: Magnesium: 2.2 mg/dL (ref 1.7–2.4)

## 2024-02-13 LAB — HEPARIN LEVEL (UNFRACTIONATED)
Heparin Unfractionated: >1.1 [IU]/mL — ABNORMAL HIGH (ref 0.30–0.70)
Heparin Unfractionated: 1.07 [IU]/mL — ABNORMAL HIGH (ref 0.30–0.70)

## 2024-02-13 LAB — APTT
aPTT: 48 s — ABNORMAL HIGH (ref 24–36)
aPTT: 69 s — ABNORMAL HIGH (ref 24–36)

## 2024-02-13 MED ORDER — AMIODARONE HCL 200 MG PO TABS
200.0000 mg | ORAL_TABLET | Freq: Every day | ORAL | Status: DC
  Administered 2024-02-20: 200 mg via ORAL
  Filled 2024-02-13: qty 1

## 2024-02-13 MED ORDER — CARVEDILOL 3.125 MG PO TABS
3.1250 mg | ORAL_TABLET | Freq: Two times a day (BID) | ORAL | Status: DC
  Administered 2024-02-13 – 2024-02-20 (×16): 3.125 mg via ORAL
  Filled 2024-02-13 (×17): qty 1

## 2024-02-13 MED ORDER — AMIODARONE HCL 200 MG PO TABS
200.0000 mg | ORAL_TABLET | Freq: Two times a day (BID) | ORAL | Status: AC
  Administered 2024-02-13 – 2024-02-19 (×12): 200 mg via ORAL
  Filled 2024-02-13 (×14): qty 1

## 2024-02-13 NOTE — Progress Notes (Signed)
 OT Cancellation Note  Patient Details Name: [name] [name] MRN: [id number] DOB: 01-04-1941   Cancelled Treatment:    Reason Eval/Treat Not Completed: Medical issues which prohibited therapy. Pt reports she is unable to tolerate any movement of RLE, requests to defer therapy until after ortho surgery planned for Monday. Will f/u post-op.  Lael Pierce, OT Acute Rehabilitation Services Office: [phone]   Brinton Canavan 02/13/2024, 9:39 AM

## 2024-02-13 NOTE — Progress Notes (Signed)
 PT Cancellation Note  Patient Details Name: [name] [name] MRN: [id number] DOB: 1940-11-14   Cancelled Treatment:    Reason Eval/Treat Not Completed: Pain limiting ability to participate. Pt reports she cannot tolerate any movement with her RLE. She defers PT until after ORIF of rt ankle, hopefully on Monday. Encouraged pt to move other extremities.    Pura Browns [facility] 02/13/2024, 9:33 AM Angelina Kempf PT Acute Rehabilitation Services Office [phone]

## 2024-02-13 NOTE — Plan of Care (Signed)

## 2024-02-13 NOTE — Progress Notes (Addendum)
 Cardiology Progress Note  Patient ID: [name] [name] MRN: [id number] DOB: 1941-07-19 Date of Encounter: 02/13/2024 Primary Cardiologist: Ralene Burger, MD  Subjective   Chief Complaint: Foot pain  HPI: EF is reduced.  Appears to be a stress-induced cardiomyopathy.  Maintaining sinus rhythm.  ROS:  All other ROS reviewed and negative. Pertinent positives noted in the HPI.     Telemetry  Overnight telemetry shows SR 70s, which I personally reviewed.   ECG  The most recent ECG shows SR 77 nonspecific ST-T changes, which I personally reviewed.   Physical Exam   Vitals:   02/12/24 1927 02/13/24 0026 02/13/24 0423 02/13/24 0845  BP: (!) 119/53 90/64 124/63   Pulse: 83 78 88   Resp: 17 20 20    Temp:  98.2 F (36.8 C) 98.1 F (36.7 C) 97.9 F (36.6 C)  TempSrc:  Oral Oral Oral  SpO2: 100% 95% 97%   Weight:   87.8 kg   Height:        Intake/Output Summary (Last 24 hours) at 02/13/2024 0933 Last data filed at 02/13/2024 0845 Gross per 24 hour  Intake 1100.17 ml  Output 250 ml  Net 850.17 ml       02/13/2024    4:23 AM 02/12/2024    9:29 AM 02/11/2024    8:25 PM  Last 3 Weights  Weight (lbs) 193 lb 9 oz 188 lb 7.9 oz 181 lb  Weight (kg) 87.8 kg 85.5 kg 82.101 kg    Body mass index is 30.32 kg/m.  General: Well nourished, well developed, in no acute distress Head: Atraumatic, normal size  Eyes: PEERLA, EOMI  Neck: Supple, no JVD Endocrine: No thryomegaly Cardiac: Normal S1, S2; RRR; no murmurs, rubs, or gallops Lungs: Clear to auscultation bilaterally, no wheezing, rhonchi or rales  Abd: Soft, nontender, no hepatomegaly  Ext: No edema, pulses 2+ Musculoskeletal: Right foot in brace Skin: Warm and dry, no rashes   Neuro: Alert and oriented to person, place, time, and situation, CNII-XII grossly intact, no focal deficits  Psych: Normal mood and affect   Cardiac Studies  TTE 02/12/2024  1. Akinesis of the distal lateral, distal septal, distal inferior, distal   anterior and apical walls with aneurysmal dilitation of the distal  inferior and apical walls. Wall motion consistent with possible Takosubo's  cardiomyopathy vs CAD. Compared to  echo report from 08/27/22, these findings are new . Aaron Aas Left ventricular  ejection fraction, by estimation, is 25 to 30%. The left ventricle has  severely decreased function.   2. Right ventricular systolic function is normal. The right ventricular  size is normal.   3. A small pericardial effusion is present.   4. The mitral valve is normal in structure. Trivial mitral valve  regurgitation.   5. The aortic valve is tricuspid. Aortic valve regurgitation is not  visualized.   6. The inferior vena cava is normal in size with <50% respiratory  variability, suggesting right atrial pressure of 8 mmHg.   Patient Profile  [name] [name] is a 83 y.o. female with CKD stage IIIb, hypertension, HFpEF, paroxysmal atrial fibrillation on Eliquis  admitted on 02/11/2024 with fall and right ankle fracture.  Course complicated by AKI, UTI, nonsustained ventricular tachycardia, Afib with RVR, non-STEMI and acute systolic heart failure.  Assessment & Plan   # NSTEMI # Acute systolic heart failure, stress-induced cardiomyopathy, EF 25-30% - Denies any chest pains or trouble breathing.  Currently admitted with fall and right ankle fracture.  Also has UTI  A-fib with RVR nonsustained VT and non-STEMI. - Her troponin elevation is minimal.  I reviewed the results of her echocardiogram.  I believe this is likely a stress-induced cardiomyopathy.  Her troponins are very minimally elevated and her LV dysfunction is out of proportion to this.  If this was a true LAD infarct I would suspect her troponin is elevated greater than 24,000.  - She is euvolemic on exam.  No need for diuresis. - We will start GDMT for her cardiomyopathy.  Again this is stress-induced.  Start carvedilol  3.125 mg twice daily.  We can add appropriate medical therapy as  we are able.  Should be mindful of her kidney disease.  # Nonsustained ventricular tachycardia - Secondary to stress-induced cardiomyopathy.   - Stop amiodarone  drip.  Transition to 200 mg amiodarone  twice daily for 7 days followed by 200 mg daily.  We will plan for 30 days of amiodarone  to get her through her procedure.  # Paroxysmal A-fib - Hold Eliquis .  Continue heparin  drip.  Transition back to Eliquis  after surgeries are completed. - Stop home flecainide .  I favor a short course of amiodarone  given nonsustained ventricular tachycardia and history of paroxysmal atrial fibrillation.  See discussion above.  Can transition back to flecainide  at a later date.  Amiodarone  is the best option in the setting of newly reduced LVEF.  If her EF improves could consider going back on flecainide  but cannot do on this for now.  # AKI # UTI - Kidney function seems to be improving.  Continue treatment for UTI.  # Fall # Right ankle fracture # Preoperative assessment - Currently admitted with right ankle fracture.  Course complicated with above issues.  No chest pain or trouble breathing.  Bigger issue is right ankle surgery.  We will not delay her urgent surgery.  This will not change management.  I believe her cardiomyopathy is stress-induced.  She will be high risk but can be monitored closely.  Was      For questions or updates, please contact [facility] Please consult [email or web address] for contact info under     Signed, Melodee Spruce T. Rolm Clos, MD, [facility] [place]  [facility]  02/13/2024 9:33 AM

## 2024-02-13 NOTE — Progress Notes (Signed)
 PHARMACY - ANTICOAGULATION CONSULT NOTE  Pharmacy Consult for heparin  Indication: chest pain/ACS  Allergies  Allergen Reactions   Codeine Nausea Only and Other (See Comments)    hallucinations   Hydrocodone  Nausea Only and Other (See Comments)    Patient Measurements: Height: 5\' 7"  (170.2 cm) Weight: 87.8 kg (193 lb 9 oz) IBW/kg (Calculated) : 61.6 HEPARIN  DW (KG): 79.5  Vital Signs: Temp: 98.1 F (36.7 C) (04/19 0423) Temp Source: Oral (04/19 0423) BP: 124/63 (04/19 0423) Pulse Rate: 88 (04/19 0423)  Labs: Recent Labs    02/11/24 1442 02/11/24 1449 02/11/24 1809 02/12/24 0601 02/12/24 0602 02/12/24 1551 02/13/24 0231  HGB 14.2 15.3*  --   --  12.6  --  12.6  HCT 46.1* 45.0  --   --  40.0  --  39.1  PLT 360  --   --   --  348  --  321  APTT  --   --   --   --  83* 52* 48*  LABPROT 20.7*  --   --   --   --   --   --   INR 1.8*  --   --   --   --   --   --   HEPARINUNFRC  --   --   --  >1.10*  --   --  >1.10*  CREATININE 3.10* 3.00*  --   --  2.24*  --  1.51*  TROPONINIHS 1,228*  --  755*  --   --   --   --     Estimated Creatinine Clearance: 33.3 mL/min (A) (by C-G formula based on SCr of 1.51 mg/dL (H)).  Assessment: 22 YOF presented with possible NSTEMI. Past medical history significant for atrial fibrillation and reports taking Eliquis  prior to arrival, last dose taken at 4/17 AM. Patient being transitioned to heparin  for possible ischemic work-up. Pharmacy consulted for heparin  dosing. Due to recent Eliquis  administration, will monitor aPTT and heparin  levels until correlating.   AM aPTT remains low at 48 seconds, despite increase in rate yesterday. Per RN, no interruptions with heparin  infusion overnight. However, will increase infusion rate slightly today given issue 4/18 with pauses due to line being bent when pt moves extremity. CBC ok - Hgb 12s, PLTc 300s   Goal of Therapy:  Heparin  level 0.3-0.7 units/ml aPTT 66-102 seconds Monitor platelets by  anticoagulation protocol: Yes   Plan:  Increase heparin  1200 units/h Recheck aPTT and heparin  level in 8 hours Monitor aPTT and heparin  level daily until correlating Monitor CBC and s/sx of bleeding  Harvest Lineman, PharmD PGY1 Pharmacy Resident

## 2024-02-13 NOTE — Plan of Care (Signed)
  Problem: Clinical Measurements: Goal: Will remain free from infection Outcome: Progressing Goal: Diagnostic test results will improve Outcome: Progressing Goal: Cardiovascular complication will be avoided Outcome: Progressing   Problem: Activity: Goal: Risk for activity intolerance will decrease Outcome: Progressing   

## 2024-02-13 NOTE — Progress Notes (Signed)
 Progress Note   Patient: [name] [name] RUE:[id number] DOB: [birth year] DOA: 02/11/2024     2 DOS: the patient was seen and examined on 02/13/2024   Brief hospital course: 83 y.o. female with medical history significant for CKD 3B, nephrolithiasis, hyperlipidemia, hypertension, chronic HFpEF, thrombocytosis, first-degree heart block, and PAF on Eliquis  who presents with right ankle pain after a fall at home.   Patient had been in her usual state until 3 or 4 days ago when she developed left flank pain and then rigors.  She noted her systolic blood pressure to be in the low 80s the following day.  She has continued to feel poor in general with nausea, a couple episodes of vomiting, and some loose stools.  She then felt acutely weak while trying to get into a car at home, fell, and suffered a right ankle deformity with severe pain.   She denies chest pain, cough, shortness of breath, or leg swelling.   ED Course: Upon arrival to the ED, patient is found to be afebrile and saturating well on nasal cannula with transient tachycardia and hypotension.  There was concern for NSVT.  Labs were most notable for creatinine 3.10, WBC 23,700, normal lactic acid, and troponin 1228.  UA features bacteriuria, pyuria, and nitrites.  Imaging is most notable for acute fracture of the distal right tibia and fibula  Assessment and Plan: 1. NSTEMI - Cardiology following, suspected to be demand ischemia - Currently on IV heparin , trop trending down - Per Cardiology, would need ischemic testing prior to return to flecanide -2d echo reviewed. New EF of 25-30%. Per Cardiology, findings are likely related to stress induced cardiomyopathy -Per Cardiology, pt would be considered high risk, however can be monitored closely. Per cardiology, should be no delay in surgery -GDMT recommended with coreg  3.125mg  bid to start   2. NSVT; PAF   - Continue amiodarone , transition to 200mg  bid x 7 days, then 200mg  daily. -Plan  amiodarone  x 30days - hold flecainide ,  -continue heparin  gtt     3. UTI   - Pt reports left flank pain and rigors and has bacteriuria, pyuria, and nitrites in urine  - f/u blood cultures -Will order urine culture -WBC improving   4. Right ankle fracture - Orthopedic surgery following - Discussed with Orthopedic Surgery. Now plan for surgery 4/21   5. AKI superimposed on CKD 3B  - Likely prerenal in setting of N/V/D and loss of appetite   - improved with hydration   6. N/V/D  - continue with antiemetic as needed  7 Prolonged QTc -repeat EKG reviewed, resolved   Subjective: No chest pains this AM  Physical Exam: Vitals:   02/13/24 0026 02/13/24 0423 02/13/24 0845 02/13/24 1214  BP: 90/64 124/63 126/65   Pulse: 78 88 77   Resp: 20 20 20 20   Temp: 98.2 F (36.8 C) 98.1 F (36.7 C) 97.9 F (36.6 C) 98.4 F (36.9 C)  TempSrc: Oral Oral Oral Oral  SpO2: 95% 97% 97%   Weight:  87.8 kg    Height:       General exam: Conversant, in no acute distress Respiratory system: normal chest rise, clear, no audible wheezing Cardiovascular system: regular rhythm, s1-s2 Gastrointestinal system: Nondistended, nontender, pos BS Central nervous system: No seizures, no tremors Extremities: No cyanosis, no joint deformities Skin: No rashes, no pallor Psychiatry: Affect normal // no auditory hallucinations   Data Reviewed:  Labs reviewed: Na 132, k 4.1, Cr 1.51, WBC 13.6  Family Communication: Pt in room, family at bedside  Disposition: Status is: Inpatient Remains inpatient appropriate because: severity of illness  Planned Discharge Destination:  Unclear at this time    Author: Cherylle Corwin, MD 02/13/2024 2:28 PM  For on call review [email or web address].

## 2024-02-13 NOTE — Progress Notes (Signed)
 PHARMACY - ANTICOAGULATION CONSULT NOTE  Pharmacy Consult for heparin  Indication: chest pain/ACS  Allergies  Allergen Reactions   Codeine Nausea Only and Other (See Comments)    hallucinations   Hydrocodone  Nausea Only and Other (See Comments)    Patient Measurements: Height: 5\' 7"  (170.2 cm) Weight: 87.8 kg (193 lb 9 oz) IBW/kg (Calculated) : 61.6 HEPARIN  DW (KG): 79.5  Vital Signs: Temp: 98.4 F (36.9 C) (04/19 1214) Temp Source: Oral (04/19 1214) BP: 126/65 (04/19 0845) Pulse Rate: 77 (04/19 0845)  Labs: Recent Labs    02/11/24 1442 02/11/24 1449 02/11/24 1449 02/11/24 1809 02/12/24 0601 02/12/24 0602 02/12/24 1551 02/13/24 0231 02/13/24 1518  HGB 14.2 15.3*  --   --   --  12.6  --  12.6  --   HCT 46.1* 45.0  --   --   --  40.0  --  39.1  --   PLT 360  --   --   --   --  348  --  321  --   APTT  --   --    < >  --   --  83* 52* 48* 69*  LABPROT 20.7*  --   --   --   --   --   --   --   --   INR 1.8*  --   --   --   --   --   --   --   --   HEPARINUNFRC  --   --   --   --  >1.10*  --   --  >1.10* 1.07*  CREATININE 3.10* 3.00*  --   --   --  2.24*  --  1.51*  --   TROPONINIHS 1,228*  --   --  755*  --   --   --   --   --    < > = values in this interval not displayed.    Estimated Creatinine Clearance: 33.3 mL/min (A) (by C-G formula based on SCr of 1.51 mg/dL (H)).  Assessment: 66 YOF presented with possible NSTEMI. Past medical history significant for atrial fibrillation and reports taking Eliquis  prior to arrival, last dose taken at 4/17 AM. Patient being transitioned to heparin  for possible ischemic work-up. Pharmacy consulted for heparin  dosing. Due to recent Eliquis  administration, will monitor aPTT and heparin  levels until correlating.   Heparin  level is elevated at 1.07 (likely 2/t DOAC), on aPTT is at goal at 69, on heparin  infusion at 1200 units/hr. Hgb 12.6, plt 321. No s/sx of bleeding or infusion issues.   Goal of Therapy:  Heparin  level 0.3-0.7  units/ml aPTT 66-102 seconds Monitor platelets by anticoagulation protocol: Yes   Plan:  Continue heparin  infusion at 1200 units/h Recheck aPTT and heparin  level in 8 hours with AM labs  Monitor aPTT and heparin  level daily until correlating Monitor CBC and s/sx of bleeding  Thank you for allowing pharmacy to participate in this patient's care,  Nieves Bars, PharmD, BCCCP Clinical Pharmacist  Phone: [phone] 02/13/2024 3:49 PM  Please check AMION for all [facility] Pharmacy phone numbers After 10:00 PM, call Main Pharmacy [phone]

## 2024-02-14 DIAGNOSIS — I214 Non-ST elevation (NSTEMI) myocardial infarction: Secondary | ICD-10-CM | POA: Diagnosis not present

## 2024-02-14 DIAGNOSIS — I4729 Other ventricular tachycardia: Secondary | ICD-10-CM | POA: Diagnosis not present

## 2024-02-14 DIAGNOSIS — I5021 Acute systolic (congestive) heart failure: Secondary | ICD-10-CM | POA: Diagnosis not present

## 2024-02-14 LAB — BASIC METABOLIC PANEL WITH GFR
Anion gap: 10 (ref 5–15)
BUN: 28 mg/dL — ABNORMAL HIGH (ref 8–23)
CO2: 19 mmol/L — ABNORMAL LOW (ref 22–32)
Calcium: 8.2 mg/dL — ABNORMAL LOW (ref 8.9–10.3)
Chloride: 102 mmol/L (ref 98–111)
Creatinine, Ser: 1.44 mg/dL — ABNORMAL HIGH (ref 0.44–1.00)
GFR, Estimated: 37 mL/min — ABNORMAL LOW (ref >60)
Glucose, Bld: 87 mg/dL (ref 70–99)
Potassium: 4.7 mmol/L (ref 3.5–5.1)
Sodium: 131 mmol/L — ABNORMAL LOW (ref 135–145)

## 2024-02-14 LAB — URINE CULTURE: Culture: 20000 — AB

## 2024-02-14 LAB — APTT
aPTT: 31 s (ref 24–36)
aPTT: 40 s — ABNORMAL HIGH (ref 24–36)

## 2024-02-14 LAB — CBC
HCT: 42.2 % (ref 36.0–46.0)
Hemoglobin: 13.4 g/dL (ref 12.0–15.0)
MCH: 28.5 pg (ref 26.0–34.0)
MCHC: 31.8 g/dL (ref 30.0–36.0)
MCV: 89.6 fL (ref 80.0–100.0)
Platelets: 340 10*3/uL (ref 150–400)
RBC: 4.71 MIL/uL (ref 3.87–5.11)
RDW: 15.7 % — ABNORMAL HIGH (ref 11.5–15.5)
WBC: 13.2 10*3/uL — ABNORMAL HIGH (ref 4.0–10.5)
nRBC: 0 % (ref 0.0–0.2)

## 2024-02-14 LAB — HEPARIN LEVEL (UNFRACTIONATED)
Heparin Unfractionated: 0.82 [IU]/mL — ABNORMAL HIGH (ref 0.30–0.70)
Heparin Unfractionated: 0.41 [IU]/mL (ref 0.30–0.70)

## 2024-02-14 MED ORDER — LINEZOLID 600 MG/300ML IV SOLN
600.0000 mg | Freq: Two times a day (BID) | INTRAVENOUS | Status: DC
  Administered 2024-02-15 (×2): 600 mg via INTRAVENOUS
  Filled 2024-02-14 (×4): qty 300

## 2024-02-14 NOTE — Progress Notes (Signed)
 PHARMACY - ANTICOAGULATION CONSULT NOTE  Pharmacy Consult for heparin  Indication: chest pain/ACS  Allergies  Allergen Reactions   Codeine Nausea Only and Other (See Comments)    hallucinations   Hydrocodone  Nausea Only and Other (See Comments)    Patient Measurements: Height: 5\' 7"  (170.2 cm) Weight: 87.7 kg (193 lb 5.5 oz) IBW/kg (Calculated) : 61.6 HEPARIN  DW (KG): 79.5  Vital Signs: Temp: 98.3 F (36.8 C) (04/20 1620) Temp Source: Oral (04/20 0827) BP: 103/54 (04/20 1620) Pulse Rate: 75 (04/20 1620)  Labs: Recent Labs    02/11/24 1809 02/12/24 0601 02/12/24 0602 02/12/24 1551 02/13/24 0231 02/13/24 1518 02/14/24 0412 02/14/24 1634  HGB  --    < > 12.6  --  12.6  --  13.4  --   HCT  --   --  40.0  --  39.1  --  42.2  --   PLT  --   --  348  --  321  --  340  --   APTT  --   --  83*   < > 48* 69* 31 40*  HEPARINUNFRC  --    < >  --   --  >1.10* 1.07* 0.82* 0.41  CREATININE  --   --  2.24*  --  1.[name]*  --  1.44*  --   TROPONINIHS 755*  --   --   --   --   --   --   --    < > = values in this interval not displayed.    Estimated Creatinine Clearance: 34.8 mL/min (A) (by C-G formula based on SCr of 1.44 mg/dL (H)).  Assessment: [name] [name] presented with possible NSTEMI. Past medical history significant for atrial fibrillation and reports taking Eliquis  prior to arrival, last dose taken at 4/17 AM. Patient being transitioned to heparin  for possible ischemic work-up. Pharmacy consulted for heparin  dosing. Due to recent Eliquis  administration, will monitor aPTT and heparin  levels until correlating.   Heparin  level is elevated at 1.07 (likely 2/t DOAC), on aPTT is at goal at 69, on heparin  infusion at 1200 units/hr. Hgb 12.6, plt 321. No s/sx of bleeding or infusion issues.   4/20 PM update: HL 0.41 aPTT 40 Hgb / PLT wnl No signs of bleeding or issues with gtt  Goal of Therapy:  Heparin  level 0.3-0.7 units/ml aPTT 66-102 seconds Monitor platelets by anticoagulation  protocol: Yes   Plan:  Increase heparin  infusion to 1600 units/h aPTT/HL 4/21 @ 0200 Monitor aPTT and heparin  level daily until correlating, then follow HL only Monitor CBC and s/sx of bleeding Follow-up post op to restart apixaban   Thank you for allowing pharmacy to participate in this patient's care,  Marleta Simmer BS, PharmD, BCPS Clinical Pharmacist 02/14/2024 5:22 PM  Contact: [phone] after 3 PM  "Be curious, not judgmental..." -Rumalda Counter

## 2024-02-14 NOTE — Plan of Care (Signed)

## 2024-02-14 NOTE — Progress Notes (Signed)
 Progress Note   Patient: [name] [name] UUV:[id number] DOB: [birth year] DOA: 02/11/2024     3 DOS: the patient was seen and examined on 02/14/2024   Brief hospital course: 83 y.o. female with medical history significant for CKD 3B, nephrolithiasis, hyperlipidemia, hypertension, chronic HFpEF, thrombocytosis, first-degree heart block, and PAF on Eliquis  who presents with right ankle pain after a fall at home.   Patient had been in her usual state until 3 or 4 days ago when she developed left flank pain and then rigors.  She noted her systolic blood pressure to be in the low 80s the following day.  She has continued to feel poor in general with nausea, a couple episodes of vomiting, and some loose stools.  She then felt acutely weak while trying to get into a car at home, fell, and suffered a right ankle deformity with severe pain.   She denies chest pain, cough, shortness of breath, or leg swelling.   ED Course: Upon arrival to the ED, patient is found to be afebrile and saturating well on nasal cannula with transient tachycardia and hypotension.  There was concern for NSVT.  Labs were most notable for creatinine 3.10, WBC 23,700, normal lactic acid, and troponin 1228.  UA features bacteriuria, pyuria, and nitrites.  Imaging is most notable for acute fracture of the distal right tibia and fibula  Assessment and Plan: 1. NSTEMI - Cardiology following, suspected to be demand ischemia - Currently on IV heparin , trop trending down - Per Cardiology, would need ischemic testing prior to return to flecanide -2d echo reviewed. New EF of 25-30%. Per Cardiology, findings are likely related to stress induced cardiomyopathy -Per Cardiology, pt would be considered high risk, however can be monitored closely. Per cardiology, should be no delay in surgery -GDMT recommended with coreg  3.125mg  bid to start. Plan to titrate other GDMT as able after surgery   2. NSVT; PAF   - Continue amiodarone , transition to  200mg  bid x 7 days, then 200mg  daily. -Plan amiodarone  x 30days - hold flecainide ,  -continue heparin  gtt     3. UTI   - Pt reports left flank pain and rigors and has bacteriuria, pyuria, and nitrites in urine  - f/u blood cultures -Will order urine culture -WBC improved   4. Right ankle fracture - Orthopedic surgery following - plan for surgery 4/21   5. AKI superimposed on CKD 3B  - Likely prerenal in setting of N/V/D and loss of appetite   - improved with hydration   6. N/V/D  - continue with antiemetic as needed  7 Prolonged QTc -repeat EKG reviewed, resolved   Subjective: Without chest pain this AM  Physical Exam: Vitals:   02/14/24 0500 02/14/24 0702 02/14/24 0827 02/14/24 1620  BP:  119/63 106/63 (!) 103/54  Pulse:  86 84 75  Resp:   16 17  Temp:  98.4 F (36.9 C) 98.2 F (36.8 C) 98.3 F (36.8 C)  TempSrc:  Oral Oral   SpO2:  94% 95% 95%  Weight: 87.7 kg     Height:       General exam: Awake, laying in bed, in nad Respiratory system: Normal respiratory effort, no wheezing Cardiovascular system: regular rate, s1, s2 Gastrointestinal system: Soft, nondistended, positive BS Central nervous system: CN2-12 grossly intact, strength intact Extremities: Perfused, no clubbing Skin: Normal skin turgor, no notable skin lesions seen Psychiatry: Mood normal // no visual hallucinations   Data Reviewed:  Labs reviewed: Na 131, K 4.7, Cr  1.44, WBC 13.2, Hgb 13.4   Family Communication: Pt in room, family not at bedside  Disposition: Status is: Inpatient Remains inpatient appropriate because: severity of illness  Planned Discharge Destination:  Unclear at this time    Author: Cherylle Corwin, MD 02/14/2024 6:38 PM  For on call review [email or web address].

## 2024-02-14 NOTE — Progress Notes (Signed)
 Cardiology Progress Note  Patient ID: BARBERA PERRITT MRN: [id number] DOB: Apr 28, 1941 Date of Encounter: 02/14/2024 Primary Cardiologist: Ralene Burger, MD  Subjective   Chief Complaint: None.   HPI: Denies chest pain or trouble breathing.  ROS:  All other ROS reviewed and negative. Pertinent positives noted in the HPI.     Telemetry  Overnight telemetry shows sinus rhythm 60s, which I personally reviewed.    Physical Exam   Vitals:   02/14/24 0308 02/14/24 0500 02/14/24 0702 02/14/24 0827  BP: 122/70  119/63 106/63  Pulse: 90  86 84  Resp: 18   16  Temp: 99 F (37.2 C)  98.4 F (36.9 C) 98.2 F (36.8 C)  TempSrc: Oral  Oral Oral  SpO2: 93%  94% 95%  Weight:  87.7 kg    Height:        Intake/Output Summary (Last 24 hours) at 02/14/2024 1012 Last data filed at 02/14/2024 0413 Gross per 24 hour  Intake 338.8 ml  Output 450 ml  Net -111.2 ml       02/14/2024    5:00 AM 02/13/2024    4:23 AM 02/12/2024    9:29 AM  Last 3 Weights  Weight (lbs) 193 lb 5.5 oz 193 lb 9 oz 188 lb 7.9 oz  Weight (kg) 87.7 kg 87.8 kg 85.5 kg    Body mass index is 30.28 kg/m.  General: Well nourished, well developed, in no acute distress Head: Atraumatic, normal size  Eyes: PEERLA, EOMI  Neck: Supple, no JVD Endocrine: No thryomegaly Cardiac: Normal S1, S2; RRR; no murmurs, rubs, or gallops Lungs: Clear to auscultation bilaterally, no wheezing, rhonchi or rales  Abd: Soft, nontender, no hepatomegaly  Ext: Right ankle in brace Musculoskeletal: No deformities, BUE and BLE strength normal and equal Skin: Warm and dry, no rashes   Neuro: Alert and oriented to person, place, time, and situation, CNII-XII grossly intact, no focal deficits  Psych: Normal mood and affect   Cardiac Studies  TTE 02/12/2024  1. Akinesis of the distal lateral, distal septal, distal inferior, distal  anterior and apical walls with aneurysmal dilitation of the distal  inferior and apical walls. Wall motion  consistent with possible Takosubo's  cardiomyopathy vs CAD. Compared to  echo report from 08/27/22, these findings are new . Aaron Aas Left ventricular  ejection fraction, by estimation, is 25 to 30%. The left ventricle has  severely decreased function.   2. Right ventricular systolic function is normal. The right ventricular  size is normal.   3. A small pericardial effusion is present.   4. The mitral valve is normal in structure. Trivial mitral valve  regurgitation.   5. The aortic valve is tricuspid. Aortic valve regurgitation is not  visualized.   6. The inferior vena cava is normal in size with <50% respiratory  variability, suggesting right atrial pressure of 8 mmHg.   Patient Profile  Lyndsie Wallman Delcarlo is a 83 y.o. female with CKD stage IIIb, hypertension, HFpEF, paroxysmal atrial fibrillation on Eliquis  admitted on 02/11/2024 with fall and right ankle fracture.  Course complicated by AKI, UTI, nonsustained ventricular tachycardia, Afib with RVR, non-STEMI and acute systolic heart failure.   Assessment & Plan   # Non-STEMI # Acute systolic heart failure, stress-induced cardiomyopathy, EF 25-30% - No chest pain or trouble breathing.  Wall motion abnormality consistent with stress-induced cardiomyopathy.  Troponins are inconsistent with large LAD infarction.  No ST elevation on EKG.  This is reassuring.  Discussed that the working  diagnosis is stress-induced cardiomyopathy. - Currently euvolemic on exam. - Carvedilol  3.125 mg twice daily added yesterday.  Blood pressure a bit soft.  Continue with this for now.  Can titrate other GDMT as we are able.  She really needs to get through surgery first. - No need for diuresis.  # Nonsustained ventricular tachycardia - Had NSVT but nothing sustained likely related to cardiomyopathy.  Was on amiodarone  drip.  Had transition to oral amiodarone .  Plan for 200 mg twice daily for 7 days followed by 200 mg daily.  She cannot be on flecainide  and this will  help maintain sinus rhythm as well.  See discussion below.  # Paroxysmal A-fib - Hold flecainide .  Continue amiodarone  as above.  Likely should remain on amiodarone  until EF has recovered.  Then can consider transition back to flecainide .  In the setting of systolic dysfunction cannot be on flecainide . - On heparin  drip.  Plan to transition back to Eliquis  after surgery.  # Fall # Right ankle fracture # Preoperative assessment - Stable from my standpoint for surgery.  This is urgent.  Euvolemic on exam and quite stable from CV standpoint.  We will not delay her surgery as this cannot change management.  # AKI # UTI # CKD stage IIIb - Serum creatinine returned to normal.  She seems stable.      For questions or updates, please contact [facility] Please consult [email or web address] for contact info under      Signed, Melodee Spruce T. Rolm Clos, MD, [facility] [place]  [facility]  02/14/2024 10:12 AM

## 2024-02-15 ENCOUNTER — Ambulatory Visit: Payer: PPO | Attending: Cardiology | Admitting: Cardiology

## 2024-02-15 ENCOUNTER — Encounter (HOSPITAL_COMMUNITY): Payer: Self-pay | Admitting: Family Medicine

## 2024-02-15 ENCOUNTER — Inpatient Hospital Stay (HOSPITAL_COMMUNITY)

## 2024-02-15 ENCOUNTER — Inpatient Hospital Stay (HOSPITAL_COMMUNITY): Admitting: Anesthesiology

## 2024-02-15 ENCOUNTER — Other Ambulatory Visit: Payer: Self-pay

## 2024-02-15 ENCOUNTER — Encounter (HOSPITAL_COMMUNITY)
Admission: EM | Disposition: A | Discharge status: Skilled Nursing Facility | Payer: Self-pay | Source: Home / Self Care | Attending: Internal Medicine

## 2024-02-15 DIAGNOSIS — S82301A Unspecified fracture of lower end of right tibia, initial encounter for closed fracture: Secondary | ICD-10-CM

## 2024-02-15 DIAGNOSIS — I2510 Atherosclerotic heart disease of native coronary artery without angina pectoris: Secondary | ICD-10-CM | POA: Diagnosis not present

## 2024-02-15 DIAGNOSIS — I5021 Acute systolic (congestive) heart failure: Secondary | ICD-10-CM | POA: Diagnosis not present

## 2024-02-15 DIAGNOSIS — S82831A Other fracture of upper and lower end of right fibula, initial encounter for closed fracture: Secondary | ICD-10-CM | POA: Diagnosis not present

## 2024-02-15 DIAGNOSIS — I4891 Unspecified atrial fibrillation: Secondary | ICD-10-CM | POA: Diagnosis not present

## 2024-02-15 DIAGNOSIS — I4729 Other ventricular tachycardia: Secondary | ICD-10-CM | POA: Diagnosis not present

## 2024-02-15 HISTORY — PX: OPEN REDUCTION INTERNAL FIXATION (ORIF) TIBIA/FIBULA FRACTURE: SHX5992

## 2024-02-15 LAB — TYPE AND SCREEN
ABO/RH(D): A POS
Antibody Screen: NEGATIVE

## 2024-02-15 LAB — CBC
HCT: 37.9 % (ref 36.0–46.0)
Hemoglobin: 12 g/dL (ref 12.0–15.0)
MCH: 28.2 pg (ref 26.0–34.0)
MCHC: 31.7 g/dL (ref 30.0–36.0)
MCV: 89 fL (ref 80.0–100.0)
Platelets: 402 10*3/uL — ABNORMAL HIGH (ref 150–400)
RBC: 4.26 MIL/uL (ref 3.87–5.11)
RDW: 15.5 % (ref 11.5–15.5)
WBC: 10.1 10*3/uL (ref 4.0–10.5)
nRBC: 0 % (ref 0.0–0.2)

## 2024-02-15 LAB — SURGICAL PCR SCREEN
MRSA, PCR: NEGATIVE
Staphylococcus aureus: NEGATIVE

## 2024-02-15 LAB — BASIC METABOLIC PANEL WITH GFR
Anion gap: 9 (ref 5–15)
BUN: 24 mg/dL — ABNORMAL HIGH (ref 8–23)
CO2: 18 mmol/L — ABNORMAL LOW (ref 22–32)
Calcium: 8.3 mg/dL — ABNORMAL LOW (ref 8.9–10.3)
Chloride: 103 mmol/L (ref 98–111)
Creatinine, Ser: 1.21 mg/dL — ABNORMAL HIGH (ref 0.44–1.00)
GFR, Estimated: 45 mL/min — ABNORMAL LOW (ref >60)
Glucose, Bld: 85 mg/dL (ref 70–99)
Potassium: 4.2 mmol/L (ref 3.5–5.1)
Sodium: 130 mmol/L — ABNORMAL LOW (ref 135–145)

## 2024-02-15 LAB — HEPARIN LEVEL (UNFRACTIONATED)
Heparin Unfractionated: 0.26 [IU]/mL — ABNORMAL LOW (ref 0.30–0.70)
Heparin Unfractionated: 0.36 [IU]/mL (ref 0.30–0.70)

## 2024-02-15 LAB — APTT: aPTT: 34 s (ref 24–36)

## 2024-02-15 LAB — VITAMIN D 25 HYDROXY (VIT D DEFICIENCY, FRACTURES): Vit D, 25-Hydroxy: 10.67 ng/mL — ABNORMAL LOW (ref 30–100)

## 2024-02-15 SURGERY — OPEN REDUCTION INTERNAL FIXATION (ORIF) TIBIA/FIBULA FRACTURE
Anesthesia: General | Laterality: Right

## 2024-02-15 MED ORDER — METOCLOPRAMIDE HCL 5 MG/ML IJ SOLN: 5.0000 mg | Freq: Three times a day (TID) | INTRAMUSCULAR | Status: DC | PRN

## 2024-02-15 MED ORDER — PHENYLEPHRINE HCL (PRESSORS) 10 MG/ML IV SOLN
INTRAVENOUS | Status: AC
  Filled 2024-02-15: qty 1

## 2024-02-15 MED ORDER — FENTANYL CITRATE (PF) 100 MCG/2ML IJ SOLN
25.0000 ug | INTRAMUSCULAR | Status: DC | PRN
  Administered 2024-02-15: 25 ug via INTRAVENOUS
  Administered 2024-02-15 (×2): 50 ug via INTRAVENOUS
  Administered 2024-02-15: 25 ug via INTRAVENOUS

## 2024-02-15 MED ORDER — PROPOFOL 10 MG/ML IV BOLUS
INTRAVENOUS | Status: DC | PRN
  Administered 2024-02-15: 60 mg via INTRAVENOUS

## 2024-02-15 MED ORDER — ONDANSETRON HCL 4 MG/2ML IJ SOLN: 4.0000 mg | Freq: Once | INTRAMUSCULAR | Status: DC | PRN

## 2024-02-15 MED ORDER — ACETAMINOPHEN 500 MG PO TABS: 1000.0000 mg | ORAL_TABLET | Freq: Once | ORAL | Status: AC

## 2024-02-15 MED ORDER — FENTANYL CITRATE (PF) 250 MCG/5ML IJ SOLN
INTRAMUSCULAR | Status: DC | PRN
  Administered 2024-02-15 (×2): 50 ug via INTRAVENOUS

## 2024-02-15 MED ORDER — OXYCODONE HCL 5 MG/5ML PO SOLN: 5.0000 mg | Freq: Once | ORAL | Status: AC | PRN

## 2024-02-15 MED ORDER — CEFAZOLIN SODIUM-DEXTROSE 2-4 GM/100ML-% IV SOLN
2.0000 g | Freq: Three times a day (TID) | INTRAVENOUS | Status: AC
  Administered 2024-02-16 (×3): 2 g via INTRAVENOUS
  Filled 2024-02-15 (×3): qty 100

## 2024-02-15 MED ORDER — FENTANYL CITRATE (PF) 250 MCG/5ML IJ SOLN
INTRAMUSCULAR | Status: AC
Start: 2024-02-15 — End: ?
  Filled 2024-02-15: qty 5

## 2024-02-15 MED ORDER — METOCLOPRAMIDE HCL 5 MG PO TABS: 5.0000 mg | ORAL_TABLET | Freq: Three times a day (TID) | ORAL | Status: DC | PRN

## 2024-02-15 MED ORDER — FENTANYL CITRATE (PF) 100 MCG/2ML IJ SOLN
INTRAMUSCULAR | Status: AC
  Filled 2024-02-15: qty 2

## 2024-02-15 MED ORDER — METHOCARBAMOL 1000 MG/10ML IJ SOLN: 500.0000 mg | Freq: Four times a day (QID) | INTRAMUSCULAR | Status: DC | PRN

## 2024-02-15 MED ORDER — ONDANSETRON HCL 4 MG/2ML IJ SOLN: 4.0000 mg | Freq: Four times a day (QID) | INTRAMUSCULAR | Status: DC | PRN

## 2024-02-15 MED ORDER — ONDANSETRON HCL 4 MG PO TABS: 4.0000 mg | ORAL_TABLET | Freq: Four times a day (QID) | ORAL | Status: DC | PRN

## 2024-02-15 MED ORDER — 0.9 % SODIUM CHLORIDE (POUR BTL) OPTIME
TOPICAL | Status: DC | PRN
  Administered 2024-02-15: 1000 mL

## 2024-02-15 MED ORDER — METHOCARBAMOL 500 MG PO TABS
500.0000 mg | ORAL_TABLET | Freq: Four times a day (QID) | ORAL | Status: DC | PRN
  Administered 2024-02-15 – 2024-02-16 (×3): 500 mg via ORAL
  Filled 2024-02-15 (×3): qty 1

## 2024-02-15 MED ORDER — DOCUSATE SODIUM 100 MG PO CAPS
100.0000 mg | ORAL_CAPSULE | Freq: Two times a day (BID) | ORAL | Status: DC
  Administered 2024-02-15 – 2024-02-17 (×4): 100 mg via ORAL
  Filled 2024-02-15 (×4): qty 1

## 2024-02-15 MED ORDER — PROPOFOL 10 MG/ML IV BOLUS
INTRAVENOUS | Status: AC
  Filled 2024-02-15: qty 20

## 2024-02-15 MED ORDER — POLYETHYLENE GLYCOL 3350 17 G PO PACK
17.0000 g | PACK | Freq: Every day | ORAL | Status: DC | PRN
  Administered 2024-02-17: 17 g via ORAL
  Filled 2024-02-15: qty 1

## 2024-02-15 MED ORDER — ONDANSETRON HCL 4 MG/2ML IJ SOLN
INTRAMUSCULAR | Status: DC | PRN
  Administered 2024-02-15: 4 mg via INTRAVENOUS

## 2024-02-15 MED ORDER — PHENYLEPHRINE HCL-NACL 20-0.9 MG/250ML-% IV SOLN
INTRAVENOUS | Status: DC | PRN
Start: 2024-02-15 — End: 2024-02-15
  Administered 2024-02-15: 30 ug/min via INTRAVENOUS

## 2024-02-15 MED ORDER — LIDOCAINE 2% (20 MG/ML) 5 ML SYRINGE
INTRAMUSCULAR | Status: DC | PRN
  Administered 2024-02-15: 60 mg via INTRAVENOUS

## 2024-02-15 MED ORDER — LACTATED RINGERS IV SOLN: INTRAVENOUS | Status: DC

## 2024-02-15 MED ORDER — PHENYLEPHRINE 80 MCG/ML (10ML) SYRINGE FOR IV PUSH (FOR BLOOD PRESSURE SUPPORT)
PREFILLED_SYRINGE | INTRAVENOUS | Status: DC | PRN
  Administered 2024-02-15: 160 ug via INTRAVENOUS
  Administered 2024-02-15: 80 ug via INTRAVENOUS
  Administered 2024-02-15 (×2): 160 ug via INTRAVENOUS
  Administered 2024-02-15: 80 ug via INTRAVENOUS

## 2024-02-15 MED ORDER — OXYCODONE HCL 5 MG PO TABS
2.5000 mg | ORAL_TABLET | ORAL | Status: DC | PRN
  Administered 2024-02-15 – 2024-02-19 (×9): 5 mg via ORAL
  Filled 2024-02-15 (×11): qty 1

## 2024-02-15 MED ORDER — FENTANYL CITRATE PF 50 MCG/ML IJ SOSY
12.5000 ug | PREFILLED_SYRINGE | INTRAMUSCULAR | Status: DC | PRN
  Administered 2024-02-16 – 2024-02-17 (×3): 50 ug via INTRAVENOUS
  Filled 2024-02-15 (×3): qty 1

## 2024-02-15 MED ORDER — OXYCODONE HCL 5 MG PO TABS
ORAL_TABLET | ORAL | Status: AC
  Filled 2024-02-15: qty 1

## 2024-02-15 MED ORDER — CHLORHEXIDINE GLUCONATE 0.12 % MT SOLN
OROMUCOSAL | Status: AC
  Administered 2024-02-15: 15 mL via OROMUCOSAL
  Filled 2024-02-15: qty 15

## 2024-02-15 MED ORDER — VANCOMYCIN HCL 1000 MG IV SOLR
INTRAVENOUS | Status: AC
  Filled 2024-02-15: qty 20

## 2024-02-15 MED ORDER — VANCOMYCIN HCL 1000 MG IV SOLR
INTRAVENOUS | Status: DC | PRN
  Administered 2024-02-15: 1000 mg via TOPICAL

## 2024-02-15 MED ORDER — CHLORHEXIDINE GLUCONATE 0.12 % MT SOLN: 15.0000 mL | Freq: Once | OROMUCOSAL | Status: AC

## 2024-02-15 MED ORDER — ORAL CARE MOUTH RINSE: 15.0000 mL | Freq: Once | OROMUCOSAL | Status: AC

## 2024-02-15 MED ORDER — OXYCODONE HCL 5 MG PO TABS
5.0000 mg | ORAL_TABLET | Freq: Once | ORAL | Status: AC | PRN
  Administered 2024-02-15: 5 mg via ORAL

## 2024-02-15 MED ORDER — ACETAMINOPHEN 500 MG PO TABS
ORAL_TABLET | ORAL | Status: AC
  Administered 2024-02-15: 1000 mg via ORAL
  Filled 2024-02-15: qty 2

## 2024-02-15 SURGICAL SUPPLY — 50 items
BAG COUNTER SPONGE SURGICOUNT (BAG) ×1 IMPLANT
BIT DRILL 2.5X2.75 QC CALB (BIT) IMPLANT
BIT DRILL CALIBRATED 2.7 (BIT) IMPLANT
BNDG COHESIVE 4X5 TAN STRL LF (GAUZE/BANDAGES/DRESSINGS) ×1 IMPLANT
BNDG ELASTIC 6X10 VLCR STRL LF (GAUZE/BANDAGES/DRESSINGS) IMPLANT
BRUSH SCRUB EZ PLAIN DRY (MISCELLANEOUS) ×2 IMPLANT
CHLORAPREP W/TINT 26 (MISCELLANEOUS) ×1 IMPLANT
COVER SURGICAL LIGHT HANDLE (MISCELLANEOUS) ×1 IMPLANT
DRAPE C-ARM 42X72 X-RAY (DRAPES) ×1 IMPLANT
DRAPE C-ARMOR (DRAPES) ×1 IMPLANT
DRAPE SURG ORHT 6 SPLT 77X108 (DRAPES) ×2 IMPLANT
DRAPE U-SHAPE 47X51 STRL (DRAPES) ×1 IMPLANT
DRSG MEPITEL 4X7.2 (GAUZE/BANDAGES/DRESSINGS) IMPLANT
ELECTRODE REM PT RTRN 9FT ADLT (ELECTROSURGICAL) ×1 IMPLANT
GAUZE SPONGE 4X4 12PLY STRL (GAUZE/BANDAGES/DRESSINGS) IMPLANT
GLOVE BIO SURGEON STRL SZ 6.5 (GLOVE) ×3 IMPLANT
GLOVE BIO SURGEON STRL SZ7.5 (GLOVE) ×3 IMPLANT
GLOVE BIOGEL PI IND STRL 6.5 (GLOVE) ×1 IMPLANT
GLOVE BIOGEL PI IND STRL 7.5 (GLOVE) ×1 IMPLANT
GOWN STRL REUS W/ TWL LRG LVL3 (GOWN DISPOSABLE) ×2 IMPLANT
KIT TURNOVER KIT B (KITS) ×1 IMPLANT
KWIRE ALPS MXV 1.6X6 ZI (WIRE) IMPLANT
MANIFOLD NEPTUNE II (INSTRUMENTS) ×1 IMPLANT
NDL HYPO 25GX1X1/2 BEV (NEEDLE) ×1 IMPLANT
NEEDLE HYPO 25GX1X1/2 BEV (NEEDLE) ×1 IMPLANT
NS IRRIG 1000ML POUR BTL (IV SOLUTION) ×1 IMPLANT
PACK TOTAL JOINT (CUSTOM PROCEDURE TRAY) ×1 IMPLANT
PAD ABD 8X10 STRL (GAUZE/BANDAGES/DRESSINGS) IMPLANT
PAD ARMBOARD POSITIONER FOAM (MISCELLANEOUS) ×2 IMPLANT
PADDING CAST ABS COTTON 4X4 ST (CAST SUPPLIES) IMPLANT
PADDING CAST ABS COTTON 6X4 NS (CAST SUPPLIES) IMPLANT
PLATE 9H 184 RT MED DIST TIB (Plate) IMPLANT
SCREW LOCK CORT STAR 3.5X30 (Screw) IMPLANT
SCREW LOCK CORT STAR 3.5X32 (Screw) IMPLANT
SCREW LOCK CORT STAR 3.5X34 (Screw) IMPLANT
SCREW LOCK CORT STAR 3.5X42 (Screw) IMPLANT
SCREW LOCK CORT STAR 3.5X46 (Screw) IMPLANT
SCREW LP NL T15 3.5X26 (Screw) IMPLANT
SCREW T15 LP CORT 3.5X48MM NS (Screw) IMPLANT
SPLINT PLASTER CAST XFAST 5X30 (CAST SUPPLIES) IMPLANT
STAPLER VISISTAT 35W (STAPLE) ×1 IMPLANT
SUCTION TUBE FRAZIER 10FR DISP (SUCTIONS) ×1 IMPLANT
SUT ETHILON 3 0 PS 1 (SUTURE) ×2 IMPLANT
SUT VIC AB 0 CT1 27XBRD ANBCTR (SUTURE) ×1 IMPLANT
SUT VIC AB 2-0 CT1 TAPERPNT 27 (SUTURE) ×2 IMPLANT
SYR CONTROL 10ML LL (SYRINGE) ×1 IMPLANT
TOWEL GREEN STERILE (TOWEL DISPOSABLE) ×2 IMPLANT
TOWEL GREEN STERILE FF (TOWEL DISPOSABLE) ×1 IMPLANT
UNDERPAD 30X36 HEAVY ABSORB (UNDERPADS AND DIAPERS) ×1 IMPLANT
WATER STERILE IRR 1000ML POUR (IV SOLUTION) ×1 IMPLANT

## 2024-02-15 NOTE — Interval H&P Note (Signed)
 History and Physical Interval Note:  02/15/2024 12:14 PM  [name] [name]  has presented today for surgery, with the diagnosis of Right distal tibia/fibula fracture.  The various methods of treatment have been discussed with the patient and family. After consideration of risks, benefits and other options for treatment, the patient has consented to  Procedure(s): OPEN REDUCTION INTERNAL FIXATION (ORIF) TIBIA/FIBULA FRACTURE (Right) as a surgical intervention.  The patient's history has been reviewed, patient examined, no change in status, stable for surgery.  I have reviewed the patient's chart and labs.  Questions were answered to the patient's satisfaction.     Arnaldo Heffron P Lenka Zhao

## 2024-02-15 NOTE — Plan of Care (Signed)

## 2024-02-15 NOTE — Progress Notes (Signed)
 PHARMACY - ANTICOAGULATION CONSULT NOTE  Pharmacy Consult for apixaban  Indication: atrial fibrillation  Allergies  Allergen Reactions   Codeine Nausea Only and Other (See Comments)    hallucinations   Hydrocodone  Nausea Only and Other (See Comments)    Patient Measurements: Height: 5\' 7"  (170.2 cm) Weight: 81.6 kg (180 lb) IBW/kg (Calculated) : 61.6 HEPARIN  DW (KG): 78.4  Vital Signs: Temp: 97.6 F (36.4 C) (04/21 1515) Temp Source: Oral (04/21 1011) BP: 115/72 (04/21 1515) Pulse Rate: 66 (04/21 1515)  Labs: Recent Labs    02/13/24 0231 02/13/24 1518 02/14/24 0412 02/14/24 1634 02/15/24 0152 02/15/24 0641  HGB 12.6  --  13.4  --  12.0  --   HCT 39.1  --  42.2  --  37.9  --   PLT 321  --  340  --  402*  --   APTT 48*   < > 31 40* 34  --   HEPARINUNFRC >1.10*   < > 0.82* 0.41 0.26* 0.36  CREATININE 1.51*  --  1.44*  --  1.21*  --    < > = values in this interval not displayed.    Estimated Creatinine Clearance: 39.4 mL/min (A) (by C-G formula based on SCr of 1.21 mg/dL (H)).   Medical History: Past Medical History:  Diagnosis Date   Abdominal aortic atherosclerosis (HCC) 05/07/2016   Noted on CT abd/pelvis   Acute renal failure (HCC) 06/04/2013   Acute respiratory failure with hypoxia (HCC) 06/04/2013   Anemia, unspecified 06/08/2013   Anticoagulant long-term use    ELIQUIS    Cancer (HCC)    uterine cancer    Chronic anticoagulation 08/04/2017   Chronic diastolic (congestive) heart failure (HCC)     pt denies    Chronic diastolic congestive heart failure (HCC) 05/31/2015   Chronic kidney disease 2014   arf on dialysis in icu   Complicated UTI (urinary tract infection) 03/31/2021   Complication of anesthesia    2021- kidney removed at [facility] pt reports did not get enough med to wake her up and she could not talk and then recovered after given more of wake up med from anesthesia    Congenital vaginal enterocele 04/15/2016   Coronary artery disease     Cystocele, midline    followed by dr c. Wayna Hails [facility] in [place])  pt uses pessary   Diarrhea 06/11/2013   Diverticulosis 05/07/2016   Noted on CT abd/pelvis   Dyslipidemia 05/31/2015   E coli bacteremia 06/08/2013   Encounter for monitoring flecainide  therapy 07/30/2016   Essential thrombocythemia (HCC) 03/04/2021   Female genital prolapse 04/15/2016   First degree heart block    General weakness    GERD (gastroesophageal reflux disease)    Hematuria    intermittently due to ureter right stent   Hiatal hernia    High risk medication use 04/15/2016   History of arteriovenostomy for renal dialysis [facility])    History of colon polyps    History of gastric polyp    History of kidney infection    12-31-2018  perinephrtic abscess  right side  s/p drains x2 01-10-2019,  05/ 2020 drains removed   History of kidney stones    History of peptic ulcer disease    History of septic shock    2011 (pt unaware) and 06-04-2013  w/ acute renal failure   History of uterine cancer    STAGE I  --  S/P TAH W/ BSO  (NO OTHER TX)  History of ventilator dependency 2014   in setting of septic shock   Hydronephrosis 05/07/2016   Hyperlipemia 04/15/2016   Hyperlipidemia    Hypertension    Iron deficiency anemia hematology/ oncology--- dr Harles Lied at [facility] in [place]--- per lov note 08-07-2017  stabilized and felt to be more chronic anemia   01/ 2018  dx iron def. anemia  s/p  IV Iron infusion and taking oral iron supplement   Lactic acidosis 06/04/2013   Lumbar compression fracture (HCC)    01-22-2019 per imaging L1  (06-01-2019 per pt currently no pain)   Midline cystocele 04/15/2016   Nonfunctioning kidney 03/23/2020   NSTEMI, initial episode of care [facility]) 06/07/2013   pt denies    Occlusion of ureteral stent (HCC)    PAF (paroxysmal atrial fibrillation) (HCC) DX JUNE 2012   CARDIOLOGIST--  DR Parks Bollman--- [facility])  CHADS2    Palpitations 04/15/2016   Paroxysmal atrial fibrillation (HCC) 08/04/2017   Presence of pessary    Pyelonephritis 06/04/2013   Pyelonephritis of left kidney 06/04/2013   Right wrist fracture    per pt fractured on 03-10-2019,  had closed reduction and wearing a brace   Sepsis (HCC) 05/07/2016   Septic shock (HCC) 06/04/2013   IMO SNOMED Dx Update Oct 2024     Septic shock(785.52) 06/04/2013   Tendonitis, Achilles 04/15/2016   Ureteral obstruction, right 06/04/2013   Ureteral stricture, right urologist-- dr Secundino Dach   Chronic--- treated with ureteral stent   Urinary tract infection without hematuria    Uses walker    and wheelchair for longer distance   Ventricular ectopy 12/28/2020   Wears glasses     Medications:  Scheduled:   [MAR Hold] amiodarone   200 mg Oral BID   Followed by   [facility] Hold] amiodarone   200 mg Oral Daily   [MAR Hold] carvedilol   3.125 mg Oral BID WC   fentaNYL        fentaNYL        oxyCODONE        [MAR Hold] pantoprazole   40 mg Oral Daily   [MAR Hold] sodium chloride  flush  3 mL Intravenous Q12H   Infusions:   lactated ringers  Stopped (02/15/24 1354)   [MAR Hold] linezolid  (ZYVOX ) IV      Assessment: 83 yof presenting after fall and with R ankle fracture now s/p open reduction internal fixation on 4/21. On apixaban  for hx Afib - LD 4/17 AM.   Per surgery, okay to restart DOAC on POD1 if Hgb stable and not requiring blood transfusion. Hgb 12, plt WNL. On apixaban  2.5 mg BID PTA.  Goal of Therapy:  Monitor platelets by anticoagulation protocol: Yes   Plan:  Restart apixaban  on POD1 (4/22) pending Hgb being stable (>/= 10) and not requiring blood transfusion  Thank you for allowing pharmacy to participate in this patient's care,  Nieves Bars, PharmD, BCCCP Clinical Pharmacist  Phone: [phone] 02/15/2024 3:37 PM  Please check AMION for all [facility] phone numbers After 10:00 PM, call Main Pharmacy [phone]

## 2024-02-15 NOTE — Op Note (Signed)
 Orthopaedic Surgery Operative Note (CSN: [id number] ) Date of Surgery: 02/15/2024  Admit Date: 02/11/2024   Diagnoses: Pre-Op Diagnoses: Right distal tibia and fibula fractures  Post-Op Diagnosis: Same  Procedures: CPT 27827-Open reduction internal fixation of right distal tibia fracture CPT 27781-Nonoperative management of right distal fibula fracture  Surgeons : Primary: Laneta Pintos, MD  Assistant: [name], PA-C  Location: OR 3   Anesthesia: General   Antibiotics: Linezolid  IV scheduled and 1 gm vancomycin  powder placed topically   Tourniquet time: None    Estimated Blood Loss: Minimal  Complications:* No complications entered in OR log *   Specimens:* No specimens in log *   Implants: Implant Name Type Inv. Item Serial No. Manufacturer Lot No. LRB No. Used Action  PLATE 9H 782 RT MED DIST TIB - [id number] Plate PLATE 9H 784 RT MED DIST TIB  [employer](ORTH,TRAU,BIO,SG)  Right 1 Implanted  SCREW T15 LP CORT 3.5X48MM NS - [id number] Screw SCREW T15 LP CORT 3.5X48MM NS  [employer](ORTH,TRAU,BIO,SG)  Right 1 Implanted  SCREW LP NL T15 3.5X26 - [id number] Screw SCREW LP NL T15 3.5X26  [employer](ORTH,TRAU,BIO,SG)  Right 4 Implanted  SCREW LOCK CORT STAR 3.5X30 - [id number] Screw SCREW LOCK CORT STAR 3.5X30  [employer](ORTH,TRAU,BIO,SG)  Right 1 Implanted  SCREW LOCK CORT STAR 3.5X32 - [id number] Screw SCREW LOCK CORT STAR 3.5X32  [employer](ORTH,TRAU,BIO,SG)  Right 1 Implanted  SCREW LOCK CORT STAR 3.5X34 - [id number] Screw SCREW LOCK CORT STAR 3.5X34  [employer](ORTH,TRAU,BIO,SG)  Right 1 Implanted  SCREW LOCK CORT STAR 3.5X42 - [id number] Screw SCREW LOCK CORT STAR 3.5X42  [employer](ORTH,TRAU,BIO,SG)  Right 1 Implanted  SCREW LOCK CORT STAR 3.5X46 - [id number] Screw SCREW LOCK CORT STAR 3.5X46  [employer](ORTH,TRAU,BIO,SG)  Right 1 Implanted     Indications for Surgery: 83 year old female who sustained a ground-level fall with a right distal  tibia and fibula fracture.  Due to the unstable nature of her injury I recommend proceeding with open reduction internal fixation.  Risks and benefits were discussed with the patient and her family.  Risks include but not limited to bleeding, infection, malunion, nonunion, hardware failure, hardware irritation, nerve or blood vessel injury, and the possible anesthetic complications.  She agreed to proceed with surgery and consent was obtained.  Operative Findings: 1.  Open reduction internal fixation of right distal tibia fracture using [employer] ALPS direct medial distal tibial locking plate. 2.  Nonoperative management of right distal fibula fracture.  Procedure: The patient was identified in the preoperative holding area. Consent was confirmed with the patient and their family and all questions were answered. The operative extremity was marked after confirmation with the patient. she was then brought back to the operating room by our anesthesia colleagues.  She was placed under general anesthetic and carefully transferred over to radiolucent flattop table.  A bump was placed under her operative hip.  The right lower extremity was then prepped and draped in usual sterile fashion.  A timeout was performed to verify the patient, the procedure, and the extremity.  Preoperative antibiotics were dosed.  Fluoroscopic imaging showed the unstable nature of her injury.  The fracture was aligned and I proceeded to make a small percutaneous incision along the anterior medial aspect of the distal tibia.  Identified the neurovascular bundle medially and developed the plane between the periosteum and the skin to reach the posterior aspect of the distal tibia.  I then used a Cobb elevator to develop a path between  the skin and bone and then I slid a [employer] distal tibia medial plate subcutaneously along the medial cortex of the tibia.  I confirmed positioning with fluoroscopy and then used a K wire distally to  hold the distal portion of the plate aligned.  I then drilled and placed a nonlocking screw distally to bring the plate flush to bone.  I then percutaneously placed 4 nonlocking screws into the tibial shaft.  I then returned to the distal segment and proceeded to place nonlocking screws distally to complete the construct.  Final fluoroscopic imaging was obtained.  The fracture in the lower extremity was stable and I did not feel that distal fibula fixation was warranted.  The incisions were irrigated and closed with 3-0 nylon suture.  A gram of vancomycin  powder was placed into the incision prior to closure.  Sterile dressings were applied.  A well-padded short leg splint was then placed.  The patient was then awoke from anesthesia and taken to the PACU in stable condition  Post Op Plan/Instructions: The patient will be nonweightbearing to the right lower extremity.  She will receive postoperative Ancef .  She will receive her anticoagulation postoperatively.  I would be okay with starting her anticoagulation tomorrow postoperative day 1 at 9 AM if her hemoglobin is stable.  Will have her mobilize with physical and Occupational Therapy.  I was present and performed the entire surgery.  [name], PA-C did assist me throughout the case. An assistant was necessary given the difficulty in approach, maintenance of reduction and ability to instrument the fracture.   [name], MD [facility]

## 2024-02-15 NOTE — Transfer of Care (Signed)
 Immediate Anesthesia Transfer of Care Note  Patient: [name] [name]  Procedure(s) Performed: OPEN REDUCTION INTERNAL FIXATION (ORIF) TIBIA/FIBULA FRACTURE (Right)  Patient Location: PACU  Anesthesia Type:General  Level of Consciousness: drowsy and patient cooperative  Airway & Oxygen Therapy: Patient Spontanous Breathing  Post-op Assessment: Report given to RN and Post -op Vital signs reviewed and stable  Post vital signs: Reviewed and stable  Last Vitals:  Vitals Value Taken Time  BP 127/68 02/15/24 1400  Temp    Pulse 68 02/15/24 1401  Resp 23 02/15/24 1401  SpO2 95 % 02/15/24 1401  Vitals shown include unfiled device data.  Last Pain:  Vitals:   02/15/24 1031  TempSrc:   PainSc: 0-No pain      Patients Stated Pain Goal: 2 (02/15/24 1031)  Complications: No notable events documented.

## 2024-02-15 NOTE — Progress Notes (Signed)
 Progress Note   Patient: [name] [name] UJW:[id number] DOB: December 18, 1940 DOA: 02/11/2024     4 DOS: the patient was seen and examined on 02/15/2024   Brief hospital course: 83 y.o. female with medical history significant for CKD 3B, nephrolithiasis, hyperlipidemia, hypertension, chronic HFpEF, thrombocytosis, first-degree heart block, and PAF on Eliquis  who presents with right ankle pain after a fall at home.   Patient had been in her usual state until 3 or 4 days ago when she developed left flank pain and then rigors.  She noted her systolic blood pressure to be in the low 80s the following day.  She has continued to feel poor in general with nausea, a couple episodes of vomiting, and some loose stools.  She then felt acutely weak while trying to get into a car at home, fell, and suffered a right ankle deformity with severe pain.   She denies chest pain, cough, shortness of breath, or leg swelling.   ED Course: Upon arrival to the ED, patient is found to be afebrile and saturating well on nasal cannula with transient tachycardia and hypotension.  There was concern for NSVT.  Labs were most notable for creatinine 3.10, WBC 23,700, normal lactic acid, and troponin 1228.  UA features bacteriuria, pyuria, and nitrites.  Imaging is most notable for acute fracture of the distal right tibia and fibula  Assessment and Plan: 1. NSTEMI - Cardiology following, likely stress induced cardiomyopathy - Per Cardiology, would need ischemic testing prior to return to flecanide -2d echo reviewed. New EF of 25-30%. Per Cardiology, findings are likely related to stress induced cardiomyopathy and was clear for surgery -GDMT recommended with coreg  3.125mg  bid to start. Plan to titrate other GDMT as able after surgery   2. NSVT; PAF   - Continue amiodarone , transition to 200mg  bid x 7 days, then 200mg  daily. -Plan amiodarone  x 30days - hold flecainide ,  -resume eliquis  per Cardiology when OK with Ortho   3. UTI    - Pt reports left flank pain and rigors and has bacteriuria, pyuria, and nitrites in urine  - f/u blood cultures -completed course of rocephin  x 3 days -Urine cx is pos for enterococcus, however pt had clinically improved with rocephin    4. Right ankle fracture - Orthopedic surgery following - Pt for surgery today   5. AKI superimposed on CKD 3B  - Likely prerenal in setting of N/V/D and loss of appetite   - improved with hydration   6. N/V/D  - continue with antiemetic as needed  7 Prolonged QTc -repeat EKG reviewed, resolved   Subjective: Eager for surgery today. Without complaints  Physical Exam: Vitals:   02/15/24 1445 02/15/24 1500 02/15/24 1515 02/15/24 1544  BP: 131/74 118/77 115/72 107/63  Pulse: 67 68 66 66  Resp: 11 14 19 17   Temp:   97.6 F (36.4 C) 97.7 F (36.5 C)  TempSrc:    Oral  SpO2: 98% 90% 98% 97%  Weight:      Height:       General exam: Conversant, in no acute distress Respiratory system: normal chest rise, clear, no audible wheezing Cardiovascular system: regular rhythm, s1-s2 Gastrointestinal system: Nondistended, nontender, pos BS Central nervous system: No seizures, no tremors Extremities: No cyanosis, no joint deformities Skin: No rashes, no pallor Psychiatry: Affect normal // no auditory hallucinations   Data Reviewed:  Labs reviewed: Na 130, K 4.2, Cr 1.21, WBC 10.1, Hgb 12.0  Family Communication: Pt in room, family at bedside  Disposition: Status is: Inpatient Remains inpatient appropriate because: severity of illness  Planned Discharge Destination:  Unclear at this time    Author: Cherylle Corwin, MD 02/15/2024 5:27 PM  For on call review [email or web address].

## 2024-02-15 NOTE — Progress Notes (Signed)
 Patient Name: [name] [name] [facility] Date of Encounter: 02/15/2024 [facility] Cardiologist: Ralene Burger, MD    Interval Summary  .    Patient feeling OK this AM. She is scheduled for surgery on her ankle around 1115. She denies any chest pain, shortness of breath, orthopnea. Denies dizziness, syncope, near syncope, though she has not gotten out of bed. Per telemetry, she is maintaining NSR. No afib or NSVT noted  Vital Signs .    Vitals:   02/14/24 2000 02/15/24 0000 02/15/24 0347 02/15/24 0500  BP: (!) 101/53 (!) 115/56 121/62   Pulse: 75 79 80   Resp: 17 16 17    Temp: 98.6 F (37 C) 98.1 F (36.7 C) 98.8 F (37.1 C)   TempSrc: Oral Oral Oral   SpO2: 97% 94% 98%   Weight:    88.5 kg  Height:        Intake/Output Summary (Last 24 hours) at 02/15/2024 0755 Last data filed at 02/15/2024 0353 Gross per 24 hour  Intake 120 ml  Output 550 ml  Net -430 ml      02/15/2024    5:00 AM 02/14/2024    5:00 AM 02/13/2024    4:23 AM  Last 3 Weights  Weight (lbs) 195 lb 1.7 oz 193 lb 5.5 oz 193 lb 9 oz  Weight (kg) 88.5 kg 87.7 kg 87.8 kg      Telemetry/ECG    NSR with first degree AV block  - Personally Reviewed  Physical Exam .   GEN: No acute distress.  Laying in the bed with head elevated  Neck: No JVD Cardiac:  RRR, no murmurs, rubs, or gallops. Radial pulses 2+ bilaterally  Respiratory: Clear to auscultation bilaterally. Normal WOB on room air  GI: Soft, nontender, non-distended  MS: No edema in L lower extremity. R lower extremity is wrapped in splint   Assessment & Plan .     NSTEMI  Acute Systolic Heart Failure, stress-induced cardiomyopathy  - Patient presented after she had a mechanical fall while getting into a car with immediate right ankle pain/deformity. Ankle reduced in the field.  - hsTn [id number] - Echocardiogram this admission with EF 25-30%, wall motion abnormalities consistent with possible Takosubo's cardiomyopathy vs CAD - Troponins are  inconsistent with large LAD infarction, no ST elevation on EKG. Overall, working diagnosis is stress-induced cardiomyopathy  - Continue carvedilol  3.125 mg BID  - BP low-normal. After surgery, plan to titrate GDMT as tolerated - Patient is euvolemic on exam today. No diuresis at this time  - Will need repeat echo in 2-3 months as an outpatient to reassess EF   NSVT  - On arrival, patient had episodes of NSVT. Likely related to cardiomyopathy. She was treated with IV amiodarone , now on PO amiodarone   - Per telemetry, patient maintaining NSR. No NSVT noted  - Continue amiodarone  200 mg BID, transition to amiodarone  200 mg daily on 4/26 - K 4.2, mag 2.2   Paroxysmal Atrial Fibrillation  - Previously on flecainide , but this has been held as patient now on amiodarone  as above  - Likely should remain on amiodarone  until EF recovers - Continue IV heparin  perioperatively  - Plan to transition back to eliquis  after surgery   Fall Right ankle fracture  Preoperative assessment  - Patient euvolemic on exam. Maintaining NSR without further NSVT on telemetry. Overall stable from CV standpoint prior to surgery later today   Otherwise per primary  - AKI - UTI  - CKD stage  IIIb   For questions or updates, please contact [facility] Please consult [email or web address] for contact info under        Signed, Debria Fang, PA-C

## 2024-02-15 NOTE — Progress Notes (Signed)
 PHARMACY - ANTICOAGULATION CONSULT NOTE  Pharmacy Consult for heparin  Indication: chest pain/ACS  Allergies  Allergen Reactions   Codeine Nausea Only and Other (See Comments)    hallucinations   Hydrocodone  Nausea Only and Other (See Comments)    Patient Measurements: Height: 5\' 7"  (170.2 cm) Weight: 87.7 kg (193 lb 5.5 oz) IBW/kg (Calculated) : 61.6 HEPARIN  DW (KG): 79.5  Vital Signs: Temp: 98.1 F (36.7 C) (04/21 0000) Temp Source: Oral (04/21 0000) BP: 115/56 (04/21 0000) Pulse Rate: 79 (04/21 0000)  Labs: Recent Labs    02/13/24 0231 02/13/24 1518 02/14/24 0412 02/14/24 1634 02/15/24 0152  HGB 12.6  --  13.4  --  12.0  HCT 39.1  --  42.2  --  37.9  PLT 321  --  340  --  402*  APTT 48*   < > 31 40* 34  HEPARINUNFRC >1.10*   < > 0.82* 0.41 0.26*  CREATININE 1.51*  --  1.44*  --  1.21*   < > = values in this interval not displayed.    Estimated Creatinine Clearance: 40.7 mL/min (A) (by C-G formula based on SCr of 1.21 mg/dL (H)).  Assessment: [name] [name] presented with possible NSTEMI. Past medical history significant for atrial fibrillation and reports taking Eliquis  prior to arrival, last dose taken at 4/17 AM. Patient being transitioned to heparin  for possible ischemic work-up. Pharmacy consulted for heparin  dosing. Due to recent Eliquis  administration, will monitor aPTT and heparin  levels until correlating.   Heparin  level is elevated at 1.07 (likely 2/t DOAC), on aPTT is at goal at 69, on heparin  infusion at 1200 units/hr. Hgb 12.6, plt 321. No s/sx of bleeding or infusion issues.    4/21 AM update:  aPTT sub-therapeutic   Goal of Therapy:  Heparin  level 0.3-0.7 units/ml aPTT 66-102 seconds Monitor platelets by anticoagulation protocol: Yes   Plan:  Inc heparin  to 1750 units/hr Heparin  level and aPTT in 8 hours  Silvestre Drum, PharmD, BCPS Clinical Pharmacist Phone: [phone]

## 2024-02-15 NOTE — Anesthesia Preprocedure Evaluation (Addendum)
 Anesthesia Evaluation  Patient identified by MRN, date of birth, ID band Patient awake    Reviewed: Allergy & Precautions, NPO status , Patient's Chart, lab work & pertinent test results  History of Anesthesia Complications Negative for: history of anesthetic complications  Airway Mallampati: II  TM Distance: >3 FB Neck ROM: Full    Dental  (+) Dental Advisory Given, Teeth Intact   Pulmonary neg pulmonary ROS   Pulmonary exam normal        Cardiovascular hypertension, Pt. on medications + CAD, + Past MI and +CHF  Normal cardiovascular exam+ dysrhythmias Atrial Fibrillation and Ventricular Tachycardia    '25 TTE - Akinesis of the distal lateral, distal septal, distal inferior, distal anterior and apical walls with aneurysmal dilitation of the distal inferior and apical walls. Wall motion consistent with possible Takosubo's cardiomyopathy vs CAD. Compared to echo report from 08/27/22, these findings are new. EF 25 to 30%. A small pericardial effusion is present. Trivial mitral valve regurgitation.     Neuro/Psych negative neurological ROS  negative psych ROS   GI/Hepatic Neg liver ROS, hiatal hernia,GERD  Medicated and Controlled,,  Endo/Other   Na 130 Obesity   Renal/GU CRFRenal disease     Musculoskeletal negative musculoskeletal ROS (+)    Abdominal   Peds  Hematology  On eliquis     Anesthesia Other Findings   Reproductive/Obstetrics  Uterine cancer                              Anesthesia Physical Anesthesia Plan  ASA: 4  Anesthesia Plan: General   Post-op Pain Management: Tylenol  PO (pre-op)*   Induction: Intravenous  PONV Risk Score and Plan: 3 and Treatment may vary due to age or medical condition, Ondansetron  and Propofol  infusion  Airway Management Planned: LMA  Additional Equipment: ClearSight  Intra-op Plan:   Post-operative Plan: Extubation in OR  Informed  Consent: I have reviewed the patients History and Physical, chart, labs and discussed the procedure including the risks, benefits and alternatives for the proposed anesthesia with the patient or authorized representative who has indicated his/her understanding and acceptance.     Dental advisory given  Plan Discussed with: CRNA and Anesthesiologist  Anesthesia Plan Comments: (Grade 2 view with Mac 4 in 2021, last intubation record on file)        Anesthesia Quick Evaluation

## 2024-02-15 NOTE — Anesthesia Procedure Notes (Signed)
 Procedure Name: LMA Insertion Date/Time: 02/15/2024 12:44 PM  Performed by: Katrinka Parr, CRNAPre-anesthesia Checklist: Patient identified, Emergency Drugs available, Suction available and Patient being monitored Patient Re-evaluated:Patient Re-evaluated prior to induction Oxygen Delivery Method: Circle System Utilized Preoxygenation: Pre-oxygenation with 100% oxygen Induction Type: IV induction Ventilation: Mask ventilation without difficulty LMA: LMA inserted LMA Size: 4.0 Number of attempts: 1 Airway Equipment and Method: Bite block Placement Confirmation: positive ETCO2 Tube secured with: Tape Dental Injury: Teeth and Oropharynx as per pre-operative assessment

## 2024-02-15 NOTE — Anesthesia Postprocedure Evaluation (Signed)
 Anesthesia Post Note  Patient: [name] [name]  Procedure(s) Performed: OPEN REDUCTION INTERNAL FIXATION (ORIF) TIBIA/FIBULA FRACTURE (Right)     Patient location during evaluation: PACU Anesthesia Type: General Level of consciousness: awake and alert Pain management: pain level controlled Vital Signs Assessment: post-procedure vital signs reviewed and stable Respiratory status: spontaneous breathing, nonlabored ventilation and respiratory function stable Cardiovascular status: stable and blood pressure returned to baseline Anesthetic complications: no  No notable events documented.  Last Vitals:  Vitals:   02/15/24 1445 02/15/24 1500  BP: 131/74 118/77  Pulse: 67 68  Resp: 11 14  Temp:    SpO2: 98% 90%    Last Pain:  Vitals:   02/15/24 1500  TempSrc:   PainSc: Asleep                 Juventino Oppenheim

## 2024-02-16 ENCOUNTER — Encounter (HOSPITAL_COMMUNITY): Payer: Self-pay | Admitting: Family Medicine

## 2024-02-16 DIAGNOSIS — I5021 Acute systolic (congestive) heart failure: Secondary | ICD-10-CM | POA: Diagnosis not present

## 2024-02-16 DIAGNOSIS — I4729 Other ventricular tachycardia: Secondary | ICD-10-CM | POA: Diagnosis not present

## 2024-02-16 LAB — CBC
HCT: 42.4 % (ref 36.0–46.0)
Hemoglobin: 13.5 g/dL (ref 12.0–15.0)
MCH: 28.2 pg (ref 26.0–34.0)
MCHC: 31.8 g/dL (ref 30.0–36.0)
MCV: 88.7 fL (ref 80.0–100.0)
Platelets: 500 10*3/uL — ABNORMAL HIGH (ref 150–400)
RBC: 4.78 MIL/uL (ref 3.87–5.11)
RDW: 15.6 % — ABNORMAL HIGH (ref 11.5–15.5)
WBC: 15 10*3/uL — ABNORMAL HIGH (ref 4.0–10.5)
nRBC: 0 % (ref 0.0–0.2)

## 2024-02-16 LAB — BASIC METABOLIC PANEL WITH GFR
Anion gap: 12 (ref 5–15)
BUN: 16 mg/dL (ref 8–23)
CO2: 18 mmol/L — ABNORMAL LOW (ref 22–32)
Calcium: 8.4 mg/dL — ABNORMAL LOW (ref 8.9–10.3)
Chloride: 98 mmol/L (ref 98–111)
Creatinine, Ser: 1.07 mg/dL — ABNORMAL HIGH (ref 0.44–1.00)
GFR, Estimated: 52 mL/min — ABNORMAL LOW (ref >60)
Glucose, Bld: 89 mg/dL (ref 70–99)
Potassium: 4.6 mmol/L (ref 3.5–5.1)
Sodium: 128 mmol/L — ABNORMAL LOW (ref 135–145)

## 2024-02-16 LAB — CULTURE, BLOOD (ROUTINE X 2): Culture: NO GROWTH

## 2024-02-16 MED ORDER — LOSARTAN POTASSIUM 25 MG PO TABS
12.5000 mg | ORAL_TABLET | Freq: Every day | ORAL | Status: DC
Start: 2024-02-16 — End: 2024-02-20
  Administered 2024-02-16 – 2024-02-20 (×5): 12.5 mg via ORAL
  Filled 2024-02-16 (×5): qty 1

## 2024-02-16 MED ORDER — LINEZOLID 600 MG PO TABS
600.0000 mg | ORAL_TABLET | Freq: Two times a day (BID) | ORAL | Status: DC
Start: 2024-02-16 — End: 2024-02-17
  Administered 2024-02-16 – 2024-02-17 (×3): 600 mg via ORAL
  Filled 2024-02-16 (×4): qty 1

## 2024-02-16 MED ORDER — APIXABAN 5 MG PO TABS
5.0000 mg | ORAL_TABLET | Freq: Two times a day (BID) | ORAL | Status: DC
  Administered 2024-02-16 – 2024-02-20 (×9): 5 mg via ORAL
  Filled 2024-02-16 (×9): qty 1

## 2024-02-16 NOTE — Progress Notes (Signed)
 Heart Failure Nurse Navigator Progress Note  PCP: Gaither Juba, MD PCP-Cardiologist: Gordan Latina Admission Diagnosis: none Admitted from: parking lot via EMS  Presentation:   [name] [name] presented with a mechanical fall in a parking lot, her legs gave out and she fell hitting her head on side of car and rotating her right ankle. EMS called. Orthopedic was consulted, patient had runs of NSVT, given IV amiodarone  and magnesium , Cardiology consulted, ECHO with LVEF 25-30%, felt to be consistent with stress cardiomyopathy. Plan to discharge to SNF when ready.   Patient her husband and daughter were educated on the sign and symptoms of heart failure, daily weights, when to call her doctor or go to the ED, Diet/ fluid restrictions, taking all her medications as prescribed and attending all her medical appointments. Patient and family verbalized their understanding of education, a HF TOC appointment was scheduled for 02/23/2024 @ 11 am.   ECHO/ LVEF: 25-30%  Clinical Course:  Past Medical History:  Diagnosis Date   Abdominal aortic atherosclerosis (HCC) 05/07/2016   Noted on CT abd/pelvis   Acute renal failure (HCC) 06/04/2013   Acute respiratory failure with hypoxia (HCC) 06/04/2013   Anemia, unspecified 06/08/2013   Anticoagulant long-term use    ELIQUIS    Cancer (HCC)    uterine cancer    Chronic anticoagulation 08/04/2017   Chronic diastolic (congestive) heart failure (HCC)     pt denies    Chronic diastolic congestive heart failure (HCC) 05/31/2015   Chronic kidney disease 2014   arf on dialysis in icu   Complicated UTI (urinary tract infection) 03/31/2021   Complication of anesthesia    2021- kidney [facility] pt reports did not get enough med to wake her up and she could not talk and then recovered after given more of wake up med from anesthesia    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  Wayna Hails [facility] in [place])  pt uses pessary   Diarrhea 06/11/2013   Diverticulosis 05/07/2016   Noted on CT abd/pelvis   Dyslipidemia 05/31/2015   E coli bacteremia 06/08/2013   Encounter for monitoring flecainide  therapy 07/30/2016   Essential thrombocythemia (HCC) 03/04/2021   Female genital prolapse 04/15/2016   First degree heart block    General weakness    GERD (gastroesophageal reflux disease)    Hematuria    intermittently due to ureter right stent   Hiatal hernia    High risk medication use 04/15/2016   History of arteriovenostomy for renal dialysis [facility])    History of colon polyps    History of gastric polyp    History of kidney infection    12-31-2018  perinephrtic abscess  right side  s/p drains x2 01-10-2019,  05/ 2020 drains removed   History of kidney stones    History of peptic ulcer disease    History of septic shock    2011 (pt unaware) and 06-04-2013  w/ acute renal failure   History of uterine cancer    STAGE I  --  S/P TAH W/ BSO  (NO OTHER TX)   History of ventilator dependency 2014   in setting of septic shock   Hydronephrosis 05/07/2016   Hyperlipemia 04/15/2016   Hyperlipidemia    Hypertension    Iron deficiency anemia hematology/ oncology--- dr Harles Lied at [facility] in [place]--- per lov note 08-07-2017  stabilized and felt to be  more chronic anemia   01/ 2018  dx iron def. anemia  s/p  IV Iron infusion and taking oral iron supplement   Lactic acidosis 06/04/2013   Lumbar compression fracture (HCC)    01-22-2019 per imaging L1  (06-01-2019 per pt currently no pain)   Midline cystocele 04/15/2016   Nonfunctioning kidney 03/23/2020   NSTEMI, initial episode of care [facility]) 06/07/2013   pt denies    Occlusion of ureteral stent (HCC)    PAF (paroxysmal atrial fibrillation) (HCC) DX JUNE 2012   CARDIOLOGIST--  DR Parks Bollman--- [facility])  CHADS2   Palpitations 04/15/2016   Paroxysmal  atrial fibrillation (HCC) 08/04/2017   Presence of pessary    Pyelonephritis 06/04/2013   Pyelonephritis of left kidney 06/04/2013   Right wrist fracture    per pt fractured on 03-10-2019,  had closed reduction and wearing a brace   Sepsis (HCC) 05/07/2016   Septic shock (HCC) 06/04/2013   IMO SNOMED Dx Update Oct 2024     Septic shock(785.52) 06/04/2013   Tendonitis, Achilles 04/15/2016   Ureteral obstruction, right 06/04/2013   Ureteral stricture, right urologist-- dr Secundino Dach   Chronic--- treated with ureteral stent   Urinary tract infection without hematuria    Uses walker    and wheelchair for longer distance   Ventricular ectopy 12/28/2020   Wears glasses      Social History   Socioeconomic History   Marital status: Married    Spouse name: Not on file   Number of children: Not on file   Years of education: Not on file   Highest education level: Not on file  Occupational History   Occupation: Retired from Community education officer  Tobacco Use   Smoking status: Never   Smokeless tobacco: Never  Vaping Use   Vaping status: Never Used  Substance and Sexual Activity   Alcohol use: No   Drug use: No   Sexual activity: Not on file  Other Topics Concern   Not on file  Social History Narrative   Lives in [place] with husband. Drives, walks some, but not very active at home.    Social Drivers of Corporate investment banker Strain: Not on file  Food Insecurity: No Food Insecurity (02/12/2024)   Hunger Vital Sign    Worried About Running Out of Food in the Last Year: Never true    Ran Out of Food in the Last Year: Never true  Transportation Needs: No Transportation Needs (02/12/2024)   PRAPARE - Administrator, Civil Service (Medical): No    Lack of Transportation (Non-Medical): No  Physical Activity: Not on file  Stress: Not on file  Social Connections: Socially Integrated (02/12/2024)   Social Connection and Isolation Panel [NHANES]    Frequency of Communication with  Friends and Family: More than three times a week    Frequency of Social Gatherings with Friends and Family: Three times a week    Attends Religious Services: 1 to 4 times per year    Active Member of Clubs or Organizations: Yes    Attends Banker Meetings: 1 to 4 times per year    Marital Status: Married   Water engineer and Provision:  Detailed education and instructions provided on heart failure disease management including the following:  Signs and symptoms of Heart Failure When to call the physician Importance of daily weights Low sodium diet Fluid restriction Medication management Anticipated future follow-up appointments  Patient education given on each  of the above topics.  Patient acknowledges understanding via teach back method and acceptance of all instructions.  Education Materials:  "Living Better With Heart Failure" Booklet, HF zone tool, & Daily Weight Tracker Tool.  Patient has scale at home: yes Patient has pill box at home: yes    High Risk Criteria for Readmission and/or Poor Patient Outcomes: Heart failure hospital admissions (last 6 months): 0  No Show rate: 0 Difficult social situation: No, lives with her husband Demonstrates medication adherence: yes Primary Language: English Literacy level: reading, writing, and comprehension.   Barriers of Care:   New HF  Diet/ fluid restrictions/ daily weights ( salt)  Considerations/Referrals:   Referral made to Heart Failure Pharmacist Stewardship: yes Referral made to Heart Failure CSW/NCM TOC: NA Referral made to Heart & Vascular TOC clinic: Yes, 02/23/2024 @ 11 am  Items for Follow-up on DC/TOC: Continued HF education Diet/ fluid restrictions ( salty foods)  Daily weights   Randie Bustle, BSN, RN Heart Failure Teacher, adult education Only

## 2024-02-16 NOTE — Evaluation (Signed)
 Occupational Therapy Evaluation Patient Details Name: [name] [name] MRN: [id number] DOB: 19-Sep-1941 Today's Date: 02/16/2024   History of Present Illness   Pt is an 83 y/o female presenting on 4/17 with R ankle pain after fall at home. With NSTEMI and per cardiology likely stress induced cardiomyopathy. 4/21 S/P ORIF R distal tibia fx, non op management of R distal fibula fx.  Also found with UTI, AKI. PMH - HTN, ckd, chf, cad, TKA, lumbar fusion, rt hip fx with hemiarthroplasty.     Clinical Impressions PTA patient reports living with her spouse and using rollator vs w/c (long distances) for mobility.  Completing ADLs with setup assist.  Admitted for above and presents with problem list below.  Limited session due to pain in R LE, pt agreeable only to repositioning in bed today.  Requires setup to total assist +2 for ADLs, max-total assist +2 for bed mobility, able to pull into long sitting with min guard.  Family interested in intensive inpatient >3hrs/day rehab at dc. Will follow acutely.      If plan is discharge home, recommend the following:   Two people to help with walking and/or transfers;Two people to help with bathing/dressing/bathroom;Assistance with cooking/housework;Assist for transportation;Help with stairs or ramp for entrance     Functional Status Assessment   Patient has had a recent decline in their functional status and demonstrates the ability to make significant improvements in function in a reasonable and predictable amount of time.     Equipment Recommendations   Other (comment) (defer)     Recommendations for Other Services         Precautions/Restrictions   Precautions Precautions: Fall Recall of Precautions/Restrictions: Intact Required Braces or Orthoses: Splint/Cast Splint/Cast: R LE Restrictions Weight Bearing Restrictions Per Provider Order: Yes RLE Weight Bearing Per Provider Order: Non weight bearing     Mobility Bed  Mobility Overal bed mobility: Needs Assistance             General bed mobility comments: repositioned in bed with total assist +2, pt pulling on overhead rail to assist.  Repositioned into chair position, pt pulling forward with UEs and min guard. pt declined attempts to EOB    Transfers                   General transfer comment: pt declined      Balance                                           ADL either performed or assessed with clinical judgement   ADL Overall ADL's : Needs assistance/impaired     Grooming: Set up;Bed level           Upper Body Dressing : Minimal assistance;Bed level   Lower Body Dressing: Total assistance;Bed level;+2 for physical assistance     Toilet Transfer Details (indicate cue type and reason): deferred         Functional mobility during ADLs: Total assistance;+2 for physical assistance       Vision Baseline Vision/History: 1 Wears glasses Vision Assessment?: No apparent visual deficits     Perception         Praxis         Pertinent Vitals/Pain Pain Assessment Pain Assessment: Faces Pain Score: 8  Faces Pain Scale: Hurts little more Pain Location: R LE Pain Descriptors / Indicators: Discomfort, Operative site  guarding, Grimacing Pain Intervention(s): Limited activity within patient's tolerance, Monitored during session, Premedicated before session, Repositioned     Extremity/Trunk Assessment Upper Extremity Assessment Upper Extremity Assessment: Generalized weakness;Right hand dominant   Lower Extremity Assessment Lower Extremity Assessment: Defer to PT evaluation       Communication Communication Communication: No apparent difficulties   Cognition Arousal: Alert Behavior During Therapy: WFL for tasks assessed/performed Cognition: No apparent impairments             OT - Cognition Comments: appears WFL, not formally assessed.  patients spouse tends to answer for her                  Following commands: Intact       Cueing  General Comments   Cueing Techniques: Verbal cues  pt declines OOB.  Discussed repositioning techniques in bed for strength and skin integrity   Exercises     Shoulder Instructions      Home Living Family/patient expects to be discharged to:: Private residence Living Arrangements: Spouse/significant other Available Help at Discharge: Family;Available 24 hours/day (95% of the time) Type of Home: House Home Access: Ramped entrance     Home Layout: One level     Bathroom Shower/Tub: Walk-in Pensions consultant: Handicapped height Bathroom Accessibility: No   Home Equipment: Scientist, research (medical) (4 wheels);Shower seat - built in;Hand held shower head;BSC/3in1;Grab bars - tub/shower          Prior Functioning/Environment Prior Level of Function : Needs assist;History of Falls (last six months)             Mobility Comments: using Rollator vs WC, most recently WC; rollator around the house ADLs Comments: able to manage ADLs with setup, toileting; manages meds, spouse completes IADLs , no driving    OT Problem List: Decreased strength;Decreased activity tolerance;Pain;Decreased knowledge of precautions;Decreased knowledge of use of DME or AE;Decreased safety awareness;Impaired balance (sitting and/or standing);Obesity   OT Treatment/Interventions: Self-care/ADL training;Therapeutic exercise;DME and/or AE instruction;Therapeutic activities;Balance training;Patient/family education      OT Goals(Current goals can be found in the care plan section)   Acute Rehab OT Goals Patient Stated Goal: get to AIR OT Goal Formulation: With patient/family Time For Goal Achievement: 03/01/24 Potential to Achieve Goals: Fair   OT Frequency:  Min 2X/week    Co-evaluation PT/OT/SLP Co-Evaluation/Treatment: Yes Reason for Co-Treatment: For patient/therapist safety;To address functional/ADL  transfers   OT goals addressed during session: ADL's and self-care      AM-PAC OT "6 Clicks" Daily Activity     Outcome Measure Help from another person eating meals?: None Help from another person taking care of personal grooming?: A Little Help from another person toileting, which includes using toliet, bedpan, or urinal?: Total Help from another person bathing (including washing, rinsing, drying)?: A Lot Help from another person to put on and taking off regular upper body clothing?: A Little Help from another person to put on and taking off regular lower body clothing?: Total 6 Click Score: 14   End of Session Nurse Communication: Mobility status;Precautions  Activity Tolerance: Patient limited by pain Patient left: in bed;with call bell/phone within reach;with bed alarm set;with family/visitor present  OT Visit Diagnosis: Other abnormalities of gait and mobility (R26.89);Muscle weakness (generalized) (M62.81);Pain;History of falling (Z91.81) Pain - Right/Left: Right Pain - part of body: Leg;Ankle and joints of foot                Time: 2202-5427 OT Time Calculation (min): 24  min Charges:  OT General Charges $OT Visit: 1 Visit OT Evaluation $OT Eval Moderate Complexity: 1 Mod  [name] [name], OT Acute Rehabilitation Services Office [phone] Secure Chat Preferred    [name] [name] 02/16/2024, 2:36 PM

## 2024-02-16 NOTE — Progress Notes (Signed)
 PHARMACIST - PHYSICIAN COMMUNICATION DR:   [name] [name] CONCERNING: Antibiotic IV to Oral Route Change Policy  RECOMMENDATION: This patient is receiving Linezolid  by the intravenous route.  Based on criteria approved by the Pharmacy and Therapeutics Committee, the antibiotic(s) is/are being converted to the equivalent oral dose form(s).   DESCRIPTION: These criteria include: Patient being treated for a respiratory tract infection, urinary tract infection, cellulitis or clostridium difficile associated diarrhea if on metronidazole The patient is not neutropenic and does not exhibit a GI malabsorption state The patient is eating (either orally or via tube) and/or has been taking other orally administered medications for a least 24 hours The patient is improving clinically and has a Tmax < 100.5  If you have questions about this conversion, please contact the Pharmacy Department  []   [phone] )  [name] [name] []   [phone] )  [facility] [x]   [phone] )  [name] [name] []   [phone] )  [facility] []   [phone] )  [name] [name]    Skeet Duke, PharmD, [place] Clinical Pharmacist Phone [phone]

## 2024-02-16 NOTE — Progress Notes (Signed)
 Patient Name: [name] [name] [facility] Date of Encounter: 02/16/2024 [facility] Cardiologist: Ralene Burger, MD   Interval Summary  .    Patient reports feeling well this AM. She has occasional pain in  her ankle, but otherwise denies any symptoms. No chest pain, palpitations, shortness of breath. Maintaining NSR per tele   Vital Signs .    Vitals:   02/15/24 1544 02/15/24 1944 02/16/24 0012 02/16/24 0439  BP: 107/63 116/62 113/66 116/66  Pulse: 66 66 79 81  Resp: 17 18 18 18   Temp: 97.7 F (36.5 C) 97.7 F (36.5 C) 98.3 F (36.8 C) 98.1 F (36.7 C)  TempSrc: Oral Oral Oral Oral  SpO2: 97% 100% 98% 96%  Weight:      Height:        Intake/Output Summary (Last 24 hours) at 02/16/2024 0718 Last data filed at 02/16/2024 9604 Gross per 24 hour  Intake 1120 ml  Output 800 ml  Net 320 ml      02/15/2024   10:11 AM 02/15/2024    5:00 AM 02/14/2024    5:00 AM  Last 3 Weights  Weight (lbs) 180 lb 195 lb 1.7 oz 193 lb 5.5 oz  Weight (kg) 81.647 kg 88.5 kg 87.7 kg      Telemetry/ECG    NSR - Personally Reviewed  Physical Exam .   GEN: No acute distress.  Sitting comfortably in the bed  Neck: No JVD Cardiac:  RRR. Radial pulses 2+ bilaterally  Respiratory: Breathing unlabored  GI: Soft, nontender, non-distended  MS: No edema in LLE. RLE wrapped in splint   Assessment & Plan .     NSTEMI  Acute Systolic Heart Failure, stress-induced cardiomyopathy  - Patient presented after she had a mechanical fall while getting into a car with immediate right ankle pain/deformity. Ankle reduced in the field.  - hsTn [id number] - Echocardiogram this admission with EF 25-30%, wall motion abnormalities consistent with possible Takosubo's cardiomyopathy vs CAD - Troponins are inconsistent with large LAD infarction, no ST elevation on EKG. Overall, working diagnosis is stress-induced cardiomyopathy  - Continue carvedilol  3.125 mg BID  - Creatinine initially elevated to 3.1 on  arrival,has improved to 1.07  - Start losartan  12.5 mg daily. BP unable to tolerate entresto  - No SGLT2i for now with her recurrent UTIs  - Patient is euvolemic on exam today. No diuresis at this time  - Will need repeat echo in 2-3 months as an outpatient to reassess EF    NSVT  - On arrival, patient had episodes of NSVT. Likely related to cardiomyopathy. She was treated with IV amiodarone , now on PO amiodarone   - Per telemetry, patient maintaining NSR. No NSVT noted  - Continue amiodarone  200 mg BID, transition to amiodarone  200 mg daily on 4/26  - K 4.2, mag 2.2    Paroxysmal Atrial Fibrillation  - Previously on flecainide , but this has been held as patient now on amiodarone  as above  - Maintaining NSR per tele  - Likely should remain on amiodarone  until EF recovers - Continue IV heparin  for now  - Plan to transition back to eliquis  when OK with ortho surgery    Fall Right ankle fracture  Preoperative assessment  - Now S/p ORIF  - Followed by ortho   Otherwise per primary  - AKI - UTI  - CKD stage IIIb   For questions or updates, please contact [facility] Please consult [email or web address] for contact info under  Signed, Debria Fang, PA-C

## 2024-02-16 NOTE — Evaluation (Signed)
 Physical Therapy Evaluation Patient Details Name: [name] [name] MRN: [id number] DOB: 05/04/1941 Today's Date: 02/16/2024  History of Present Illness  Pt is an 83 y/o female presenting on 4/17 with R ankle pain after fall at home. With NSTEMI and per cardiology likely stress induced cardiomyopathy. 4/21 S/P ORIF R distal tibia fx, non op management of R distal fibula fx.  Also found with UTI, AKI. PMH - HTN, ckd, chf, cad, TKA, lumbar fusion, rt hip fx with hemiarthroplasty.  Clinical Impression   Pt presents with LE weakness, severe RLE pain, impaired balance with history of falls, and impaired activity tolerance. Pt to benefit from acute PT to address deficits. Pt resistant to mobility today secondary to pain, tolerates repositioning in bed and chair position with R knee slightly flexed. Pt and family both state they want pt to receive intensive therapies post-acutely, appropriate recommendation selected. PT to progress mobility as tolerated, and will continue to follow acutely.          If plan is discharge home, recommend the following: Two people to help with walking and/or transfers;Two people to help with bathing/dressing/bathroom   Can travel by private vehicle        Equipment Recommendations Rolling walker (2 wheels)  Recommendations for Other Services       Functional Status Assessment Patient has had a recent decline in their functional status and demonstrates the ability to make significant improvements in function in a reasonable and predictable amount of time.     Precautions / Restrictions Precautions Precautions: Fall Recall of Precautions/Restrictions: Intact Required Braces or Orthoses: Splint/Cast Splint/Cast: R LE Restrictions Weight Bearing Restrictions Per Provider Order: Yes RLE Weight Bearing Per Provider Order: Non weight bearing      Mobility  Bed Mobility Overal bed mobility: Needs Assistance             General bed mobility comments:  repositioned in bed with total assist +2, pt pulling on overhead rail to assist.  Repositioned into chair position, pt pulling forward with UEs and min guard. pt declined attempts to EOB    Transfers                   General transfer comment: pt declined    Ambulation/Gait                  Stairs            Wheelchair Mobility     Tilt Bed    Modified Rankin (Stroke Patients Only)       Balance Overall balance assessment: History of Falls, Needs assistance     Sitting balance - Comments: pt declines       Standing balance comment: pt declines                             Pertinent Vitals/Pain Pain Assessment Pain Assessment: Faces Faces Pain Scale: Hurts little more Pain Location: R LE Pain Descriptors / Indicators: Discomfort, Operative site guarding, Grimacing Pain Intervention(s): Limited activity within patient's tolerance, Monitored during session, Repositioned    Home Living Family/patient expects to be discharged to:: Private residence Living Arrangements: Spouse/significant other Available Help at Discharge: Family;Available 24 hours/day (95% of the time) Type of Home: House Home Access: Ramped entrance       Home Layout: One level Home Equipment: Scientist, research (medical) (4 wheels);Shower seat - built in;Hand held shower head;BSC/3in1;Grab bars - tub/shower  Prior Function Prior Level of Function : Needs assist;History of Falls (last six months)             Mobility Comments: using Rollator vs WC, most recently WC; rollator around the house ADLs Comments: able to manage ADLs with setup, toileting; manages meds, spouse completes IADLs , no driving     Extremity/Trunk Assessment   Upper Extremity Assessment Upper Extremity Assessment: Defer to OT evaluation    Lower Extremity Assessment Lower Extremity Assessment: Generalized weakness;RLE deficits/detail RLE Deficits / Details: immobilized at  ankle; able to perform toe flexion/extension, SLR with PT lift assist    Cervical / Trunk Assessment Cervical / Trunk Assessment: Normal  Communication   Communication Communication: No apparent difficulties    Cognition Arousal: Alert Behavior During Therapy: WFL for tasks assessed/performed   PT - Cognitive impairments: No apparent impairments                         Following commands: Intact       Cueing Cueing Techniques: Verbal cues     General Comments General comments (skin integrity, edema, etc.): PT reviewed frequent repositioning q2 hours for pressure relief, use of chair position for pressure relief and tolerance to upright. Pt and family both educated on the importance of early mobility s/p surgery, pt and family express understanding but are not keen on pt EOB or OOB today.    Exercises     Assessment/Plan    PT Assessment Patient needs continued PT services  PT Problem List Decreased strength;Decreased mobility;Decreased activity tolerance;Decreased balance;Decreased knowledge of use of DME;Pain;Decreased safety awareness;Decreased range of motion;Decreased knowledge of precautions       PT Treatment Interventions DME instruction;Therapeutic activities;Gait training;Therapeutic exercise;Patient/family education;Balance training;Stair training;Functional mobility training;Neuromuscular re-education    PT Goals (Current goals can be found in the Care Plan section)  Acute Rehab PT Goals Patient Stated Goal: get AIR PT Goal Formulation: With patient/family Time For Goal Achievement: 03/01/24 Potential to Achieve Goals: Good    Frequency Min 3X/week     Co-evaluation PT/OT/SLP Co-Evaluation/Treatment: Yes Reason for Co-Treatment: For patient/therapist safety;To address functional/ADL transfers PT goals addressed during session: Mobility/safety with mobility;Balance OT goals addressed during session: ADL's and self-care       AM-PAC PT "6  Clicks" Mobility  Outcome Measure Help needed turning from your back to your side while in a flat bed without using bedrails?: A Lot Help needed moving from lying on your back to sitting on the side of a flat bed without using bedrails?: Total Help needed moving to and from a bed to a chair (including a wheelchair)?: Total Help needed standing up from a chair using your arms (e.g., wheelchair or bedside chair)?: Total Help needed to walk in hospital room?: Total Help needed climbing 3-5 steps with a railing? : Total 6 Click Score: 7    End of Session   Activity Tolerance: Patient limited by pain Patient left: in bed;with call bell/phone within reach;with bed alarm set;with family/visitor present Nurse Communication: Mobility status PT Visit Diagnosis: Other abnormalities of gait and mobility (R26.89);Muscle weakness (generalized) (M62.81)    Time: 8413-2440 PT Time Calculation (min) (ACUTE ONLY): 22 min   Charges:   PT Evaluation $PT Eval Low Complexity: 1 Low   PT General Charges $$ ACUTE PT VISIT: 1 Visit         Virginio Isidore S, PT DPT Acute Rehabilitation Services Secure Chat Preferred  Office [phone]   Arloa Prak E  Stroup 02/16/2024, 3:20 PM

## 2024-02-16 NOTE — Progress Notes (Signed)
 Orthopaedic Trauma Progress Note  SUBJECTIVE: Patient noted to have a rough night due to pain but is doing fairly well this morning.  Pain controlled at rest.  Has not been up out of bed yet since surgery.  No chest pain. No SOB. No nausea/vomiting. No other complaints.  Tolerating diet and fluids but has not had much of an appetite.  [name] at bedside. Patient has been to SNF ([facility]) in the past, notes she did not have a great experience.  Would like to return home at discharge.  Notes her husband will be able to assist her at discharge.  OBJECTIVE:  Vitals:   02/16/24 0012 02/16/24 0439  BP: 113/66 116/66  Pulse: 79 81  Resp: 18 18  Temp: 98.3 F (36.8 C) 98.1 F (36.7 C)  SpO2: 98% 96%    Opiates Today (MME): Today's  total administered Morphine  Milligram Equivalents: 7.5 Opiates Yesterday (MME): Yesterday's total administered Morphine  Milligram Equivalents: 97.5  General: Sitting up in bed, NAD Respiratory: No increased work of breathing.  Operative Extremity (RLE): Well padded, well fitting splint in place.  Nontender above splint.  Endorses sensation to light touch over the toes.  Able to wiggle the toes.  Warm well-perfused toes  IMAGING: Stable post op imaging.   LABS:  Results for orders placed or performed during the hospital encounter of 02/11/24 (from the past 24 hours)  Type and screen [facility]     Status: None   Collection Time: 02/15/24 11:10 AM  Result Value Ref Range   ABO/RH(D) A POS    Antibody Screen NEG    Sample Expiration      02/18/2024,2359 Performed at [facility], [address], [place], [place] [address]   CBC     Status: Abnormal   Collection Time: 02/16/24  6:39 AM  Result Value Ref Range   WBC 15.0 (H) 4.0 - 10.5 K/uL   RBC 4.78 3.87 - 5.11 MIL/uL   Hemoglobin 13.5 12.0 - 15.0 g/dL   HCT 40.9 81.1 - 91.4 %   MCV 88.7 80.0 - 100.0 fL   MCH 28.2 26.0 - 34.0 pg   MCHC 31.8 30.0 - 36.0 g/dL   RDW 78.2 (H)  95.6 - 15.5 %   Platelets 500 (H) 150 - 400 K/uL   nRBC 0.0 0.0 - 0.2 %  Basic metabolic panel     Status: Abnormal   Collection Time: 02/16/24  6:39 AM  Result Value Ref Range   Sodium 128 (L) 135 - 145 mmol/L   Potassium 4.6 3.5 - 5.1 mmol/L   Chloride 98 98 - 111 mmol/L   CO2 18 (L) 22 - 32 mmol/L   Glucose, Bld 89 70 - 99 mg/dL   BUN 16 8 - 23 mg/dL   Creatinine, Ser 2.13 (H) 0.44 - 1.00 mg/dL   Calcium  8.4 (L) 8.9 - 10.3 mg/dL   GFR, Estimated 52 (L) >60 mL/min   Anion gap 12 5 - 15    ASSESSMENT: [name] [name] is a 83 y.o. female, 1 Day Post-Op s/p mechanical fall Procedures: OPEN REDUCTION INTERNAL FIXATION RIGHT TIBIA/FIBULA FRACTURE  CV/Blood loss: Acute blood loss anemia, Hgb 13.5 this AM. Hemodynamically stable  PLAN: Weightbearing: NWB RLE ROM: Maintain short leg splint.  Okay for knee motion Incisional and dressing care: Dressings left intact until follow-up  Showering: Okay to shower, keep splint covered and dry Orthopedic device(s): Splint RLE Pain management:  1. Tylenol  650 mg q  6 hours PRN 2. Robaxin  500 mg q 6 hours PRN 3. Oxycodone  2.5-5 mg q 4 hours PRN 4. Dilaudid  0.5-1 mg q 4 hours PRN VTE prophylaxis: Okay to resume home dose Eliquis  today. SCDs ID:  Ancef  2gm post op Foley/Lines:  No foley, KVO IVFs Impediments to Fracture Healing: Vitamin D  level 10, start on supplementation Dispo: PT/OT evaluation today, dispo pending.  Patient not interested in SNF at this point.  Would like to return home.  Okay for discharge from ortho standpoint once cleared by medicine team and therapies  D/C recommendations: - Oxycodone  2.5 mg, Tylenol , Robaxin  for pain control - Home dose Eliquis  for DVT prophylaxis - Continue 50,000 units Vit D supplementation q. 7 days x 8 weeks  Follow - up plan: 2 weeks after d/c for splint removal, wound check, and repeat x-rays   Contact information:  [name] MD, [name] PA-C. After hours and holidays please check  [email or web address] for group call information for [facility]   [name], PA-C [phone] (office) [email or web address]

## 2024-02-16 NOTE — Progress Notes (Signed)
 PHARMACY - ANTICOAGULATION CONSULT NOTE  Pharmacy Consult:  Eliquis  Indication: atrial fibrillation  Allergies  Allergen Reactions   Codeine Nausea Only and Other (See Comments)    hallucinations   Hydrocodone  Nausea Only and Other (See Comments)    Patient Measurements: Height: 5\' 7"  (170.2 cm) Weight: 81.6 kg (180 lb) IBW/kg (Calculated) : 61.6 HEPARIN  DW (KG): 78.4  Vital Signs: Temp: 98.1 F (36.7 C) (04/22 0439) Temp Source: Oral (04/22 0439) BP: 116/66 (04/22 0439) Pulse Rate: 81 (04/22 0439)  Labs: Recent Labs    02/14/24 0412 02/14/24 1634 02/15/24 0152 02/15/24 0641 02/16/24 0639  HGB 13.4  --  12.0  --  13.5  HCT 42.2  --  37.9  --  42.4  PLT 340  --  402*  --  500*  APTT 31 40* 34  --   --   HEPARINUNFRC 0.82* 0.41 0.26* 0.36  --   CREATININE 1.44*  --  1.21*  --  1.07*    Estimated Creatinine Clearance: 44.5 mL/min (A) (by C-G formula based on SCr of 1.07 mg/dL (H)).   Medical History: Past Medical History:  Diagnosis Date   Abdominal aortic atherosclerosis (HCC) 05/07/2016   Noted on CT abd/pelvis   Acute renal failure (HCC) 06/04/2013   Acute respiratory failure with hypoxia (HCC) 06/04/2013   Anemia, unspecified 06/08/2013   Anticoagulant long-term use    ELIQUIS    Cancer (HCC)    uterine cancer    Chronic anticoagulation 08/04/2017   Chronic diastolic (congestive) heart failure (HCC)     pt denies    Chronic diastolic congestive heart failure (HCC) 05/31/2015   Chronic kidney disease 2014   arf on dialysis in icu   Complicated UTI (urinary tract infection) 03/31/2021   Complication of anesthesia    2021- kidney removed at [facility] pt reports did not get enough med to wake her up and she could not talk and then recovered after given more of wake up med from anesthesia    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 Wayna Hails [facility] in [place])   pt uses pessary   Diarrhea 06/11/2013   Diverticulosis 05/07/2016   Noted on CT abd/pelvis   Dyslipidemia 05/31/2015   E coli bacteremia 06/08/2013   Encounter for monitoring flecainide  therapy 07/30/2016   Essential thrombocythemia (HCC) 03/04/2021   Female genital prolapse 04/15/2016   First degree heart block    General weakness    GERD (gastroesophageal reflux disease)    Hematuria    intermittently due to ureter right stent   Hiatal hernia    High risk medication use 04/15/2016   History of arteriovenostomy for renal dialysis [facility])    History of colon polyps    History of gastric polyp    History of kidney infection    12-31-2018  perinephrtic abscess  right side  s/p drains x2 01-10-2019,  05/ 2020 drains removed   History of kidney stones    History of peptic ulcer disease    History of septic shock    2011 (pt unaware) and 06-04-2013  w/ acute renal failure   History of uterine cancer    STAGE I  --  S/P TAH W/ BSO  (NO OTHER TX)   History of ventilator dependency 2014   in setting of septic shock   Hydronephrosis 05/07/2016   Hyperlipemia 04/15/2016   Hyperlipidemia    Hypertension  Iron deficiency anemia hematology/ oncology--- dr Harles Lied at [facility] in [place]--- per lov note 08-07-2017  stabilized and felt to be more chronic anemia   01/ 2018  dx iron def. anemia  s/p  IV Iron infusion and taking oral iron supplement   Lactic acidosis 06/04/2013   Lumbar compression fracture (HCC)    01-22-2019 per imaging L1  (06-01-2019 per pt currently no pain)   Midline cystocele 04/15/2016   Nonfunctioning kidney 03/23/2020   NSTEMI, initial episode of care [facility]) 06/07/2013   pt denies    Occlusion of ureteral stent (HCC)    PAF (paroxysmal atrial fibrillation) (HCC) DX JUNE 2012   CARDIOLOGIST--  DR Parks Bollman--- [facility] [place] [facility])  CHADS2   Palpitations 04/15/2016   Paroxysmal atrial fibrillation (HCC) 08/04/2017   Presence of  pessary    Pyelonephritis 06/04/2013   Pyelonephritis of left kidney 06/04/2013   Right wrist fracture    per pt fractured on 03-10-2019,  had closed reduction and wearing a brace   Sepsis (HCC) 05/07/2016   Septic shock (HCC) 06/04/2013   IMO SNOMED Dx Update Oct 2024     Septic shock(785.52) 06/04/2013   Tendonitis, Achilles 04/15/2016   Ureteral obstruction, right 06/04/2013   Ureteral stricture, right urologist-- dr Secundino Dach   Chronic--- treated with ureteral stent   Urinary tract infection without hematuria    Uses walker    and wheelchair for longer distance   Ventricular ectopy 12/28/2020   Wears glasses      Assessment: 65 YOF with history of Afib on Eliquis  2.5mg  PO BID PTA (dose was recently decreased).  Patient admitted s/p fall with ankle injury and found to have an NSTEMI.  She was transitioned to IV heparin .  Now s/p ORIF and Pharmacy consulted to resume Eliquis  o POD#1 as hemoglobin is stable and patient did not require transfusion.  CBC stable; no bleeding reported.  Patient qualifies for full Eliquis  dosing as SCr < 1.5 and weight is > 60kg.  Goal of Therapy:  Appropriate anticoagulation Monitor platelets by anticoagulation protocol: Yes   Plan:  Eliquis  5mg  PO BID Pharmacy will sign off and follow peripherally.  Thank you for the consult!  Christerpher Clos D. Marikay Show, PharmD, BCPS, BCCCP 02/16/2024, 9:14 AM

## 2024-02-16 NOTE — Progress Notes (Signed)
 Inpatient Rehab Admissions Coordinator:   Per therapy recommendations pt was screened for CIR candidacy by Loye Rumble, PT, DPT.  Chart reviewed.  Note admitted following a fall at home with R distal tib/fib fracture, s/p ORIF of tibial fx.  She is to be NWB on RLE.  Unable to mobilize to EOB during initial evaluations, limited by pain.  I do not expect that her insurance would approve CIR based on diagnosis and ability to tolerate the intensity of CIR program.  We will not pursue at this time.   Loye Rumble, PT, DPT Admissions Coordinator [phone] 02/16/24  3:29 PM

## 2024-02-16 NOTE — Progress Notes (Signed)
 Progress Note   Patient: [name] [name] LKG:[id number] DOB: 1941-07-22 DOA: 02/11/2024     5 DOS: the patient was seen and examined on 02/16/2024   Brief hospital course: 83 y.o. female with medical history significant for CKD 3B, nephrolithiasis, hyperlipidemia, hypertension, chronic HFpEF, thrombocytosis, first-degree heart block, and PAF on Eliquis  who presents with right ankle pain after a fall at home.   Patient had been in her usual state until 3 or 4 days ago when she developed left flank pain and then rigors.  She noted her systolic blood pressure to be in the low 80s the following day.  She has continued to feel poor in general with nausea, a couple episodes of vomiting, and some loose stools.  She then felt acutely weak while trying to get into a car at home, fell, and suffered a right ankle deformity with severe pain.   She denies chest pain, cough, shortness of breath, or leg swelling.   ED Course: Upon arrival to the ED, patient is found to be afebrile and saturating well on nasal cannula with transient tachycardia and hypotension.  There was concern for NSVT.  Labs were most notable for creatinine 3.10, WBC 23,700, normal lactic acid, and troponin 1228.  UA features bacteriuria, pyuria, and nitrites.  Imaging is most notable for acute fracture of the distal right tibia and fibula  Assessment and Plan: 1. NSTEMI secondary to stress cardiomyopathy - Cardiology following, likely stress induced cardiomyopathy -2d echo reviewed. New EF of 25-30%. Per Cardiology, findings are likely related to stress induced cardiomyopathy and was clear for surgery -GDMT recommended with coreg  3.125mg  bid to start. -Losartan  added per Cardiology -Eliquis  resumed   2. NSVT; PAF   - Continue amiodarone , transition to 200mg  bid x 7 days, then 200mg  daily. -Plan amiodarone  x 30days - hold flecainide  per Cardiology -eliquis  continued   3. UTI   - Pt reports left flank pain and rigors and has  bacteriuria, pyuria, and nitrites in urine  - f/u blood cultures -completed course of rocephin  x 3 days -Urine cx is pos for enterococcus, however pt had clinically improved with rocephin , therefore will not treat enterococcus   4. Right ankle fracture - Orthopedic surgery following - Pt now s/p surgery 4/21   5. AKI superimposed on CKD 3B  - Likely prerenal in setting of N/V/D and loss of appetite   - improved with hydration   6. N/V/D  - continue with antiemetic as needed  7 Prolonged QTc -repeat EKG reviewed, resolved   Subjective: Without complaints today  Physical Exam: Vitals:   02/16/24 0439 02/16/24 0914 02/16/24 1246 02/16/24 1713  BP: 116/66 (!) 102/50 (!) 109/58 114/77  Pulse: 81 78 74 67  Resp: 18 17 17 17   Temp: 98.1 F (36.7 C) 98.1 F (36.7 C) 98.2 F (36.8 C) 98 F (36.7 C)  TempSrc: Oral Oral Oral Oral  SpO2: 96% 96% 98% 98%  Weight:      Height:       General exam: Awake, laying in bed, in nad Respiratory system: Normal respiratory effort, no wheezing Cardiovascular system: regular rate, s1, s2 Gastrointestinal system: Soft, nondistended, positive BS Central nervous system: CN2-12 grossly intact, strength intact Extremities: Perfused, no clubbing Skin: Normal skin turgor, no notable skin lesions seen Psychiatry: Mood normal // no visual hallucinations   Data Reviewed:  Labs reviewed: Na 128, K 4.6, Cr 1.07, WBC 15.0, Hgb 13.5  Family Communication: Pt in room, family at bedside  Disposition: Status  is: Inpatient Remains inpatient appropriate because: severity of illness  Planned Discharge Destination:  Unclear at this time    Author: Cherylle Corwin, MD 02/16/2024 6:14 PM  For on call review [email or web address].

## 2024-02-17 DIAGNOSIS — I4729 Other ventricular tachycardia: Secondary | ICD-10-CM | POA: Diagnosis not present

## 2024-02-17 LAB — COMPREHENSIVE METABOLIC PANEL WITH GFR
ALT: 6 U/L (ref 0–44)
AST: 17 U/L (ref 15–41)
Albumin: 1.9 g/dL — ABNORMAL LOW (ref 3.5–5.0)
Alkaline Phosphatase: 51 U/L (ref 38–126)
Anion gap: 10 (ref 5–15)
BUN: 13 mg/dL (ref 8–23)
CO2: 23 mmol/L (ref 22–32)
Calcium: 8.3 mg/dL — ABNORMAL LOW (ref 8.9–10.3)
Chloride: 97 mmol/L — ABNORMAL LOW (ref 98–111)
Creatinine, Ser: 1.09 mg/dL — ABNORMAL HIGH (ref 0.44–1.00)
GFR, Estimated: 51 mL/min — ABNORMAL LOW (ref >60)
Glucose, Bld: 87 mg/dL (ref 70–99)
Potassium: 4 mmol/L (ref 3.5–5.1)
Sodium: 130 mmol/L — ABNORMAL LOW (ref 135–145)
Total Bilirubin: 0.7 mg/dL (ref 0.0–1.2)
Total Protein: 5.5 g/dL — ABNORMAL LOW (ref 6.5–8.1)

## 2024-02-17 LAB — CULTURE, BLOOD (ROUTINE X 2)

## 2024-02-17 LAB — CBC
HCT: 39.6 % (ref 36.0–46.0)
Hemoglobin: 12.9 g/dL (ref 12.0–15.0)
MCH: 28.4 pg (ref 26.0–34.0)
MCHC: 32.6 g/dL (ref 30.0–36.0)
MCV: 87.2 fL (ref 80.0–100.0)
Platelets: 550 10*3/uL — ABNORMAL HIGH (ref 150–400)
RBC: 4.54 MIL/uL (ref 3.87–5.11)
RDW: 15.2 % (ref 11.5–15.5)
WBC: 13.2 10*3/uL — ABNORMAL HIGH (ref 4.0–10.5)
nRBC: 0 % (ref 0.0–0.2)

## 2024-02-17 MED ORDER — SENNOSIDES-DOCUSATE SODIUM 8.6-50 MG PO TABS
2.0000 | ORAL_TABLET | Freq: Two times a day (BID) | ORAL | Status: DC
  Filled 2024-02-17: qty 2

## 2024-02-17 MED ORDER — SENNOSIDES-DOCUSATE SODIUM 8.6-50 MG PO TABS
1.0000 | ORAL_TABLET | Freq: Two times a day (BID) | ORAL | Status: DC
  Administered 2024-02-18 – 2024-02-19 (×2): 1 via ORAL
  Filled 2024-02-17 (×5): qty 1

## 2024-02-17 NOTE — Progress Notes (Signed)
 PROGRESS NOTE    [name] [name]  ZOX:[id number] DOB: [birth year] DOA: 02/11/2024 PCP: Gaither Juba, MD    Brief Narrative:   [name] [name] is a 83 y.o. female with past medical history significant for chronic diastolic congestive heart failure, HTN, HLD, paroxysmal atrial fibrillation on Eliquis , CKD stage IIIb, thrombocytosis, history of first degree AVB who presented to [facility] ED on 02/11/2024 with right ankle pain following a fall at home.  Patient reports in her usual state of health until 3-4 days prior to hospital presentation in which she developed left flank pain and rigors.  She reported that her systolic blood pressure was low in the 80s the following day; and continued to feel poor in general with associated nausea and a few episodes of vomiting and loose stools.  She was weak while trying to get into her car at home, sustained fall and suffered right ankle deformity with severe pain.  Denies loss of consciousness.  Denies chest pain, no cough, no shortness of breath, no leg swelling.  In the ED, patient was found to be afebrile, saturating well on nasal cannula with transient tachycardia and hypotension.  Labs notable for creatinine of 3.10, WBC count of 23.7, lactic acid within normal limits, troponin 1228.  Urinalysis consistent with bacteria, pyuria and nitrites.  Imaging notable for acute fracture of the distal right tibia/fibula.  Orthopedics and cardiology were consulted.  TRH consulted for admission for further evaluation and management of right ankle fracture, NSTEMI, urinary tract infection.  Assessment & Plan:   Right ankle fracture Patient presenting with right ankle pain following fall at home.  Right ankle x-ray with acute comminuted fractures of the distal tibial and fibular diaphysis with apex medial angulation.  Orthopedics were consulted and patient underwent ORIF right distal tibia fracture and nonoperative management of right distal fibular  fracture. -- Nonweightbearing RLE with maintaining short leg splint -- Pain control with Tylenol , oxycodone , Robaxin , Dilaudid  as needed. -- Declined by CIR for admission, TOC consulted for SNF placement -- Continue PT/OT efforts while inpatient -- Outpatient follow-up with orthopedics 2 weeks for splint removal, wound check and repeat x-rays  Vitamin D  deficiency Vitamin D  25-hydroxy level 10.67. --Vitamin D  50,000 units p.o. daily x 8 weeks  NSTEMI secondary to stress cardiomyopathy Patient presenting with elevated troponin of 1228.  No ST elevation on EKG.  Echocardiogram with LVEF 25 to 30%, wall motion abnormalities consistent with possible Takotsubo's cardiomyopathy versus CAD.  Suspect etiology secondary to stress-induced cardiomyopathy per cardiology with recommendation of repeat echocardiogram in 2-3 months as outpatient to reassess LVEF. -- Carvedilol  3.125 mg p.o. twice daily -- Losartan  12.5 g p.o. daily  NSVT Paroxysmal atrial fibrillation On arrival, patient had episodes of NSVT likely related to her underlying cardiomyopathy.  Treated with IV amiodarone  now transition to oral. -- Holding home flecainide  per cardiology -- Continue amiodarone  200 mg PO BID x 7 days and transition to 200 mg p.o. daily on 4/26 -- Carvedilol  3.125 minutes p.o. twice daily -- Eliquis  500s p.o. twice daily -- Outpatient follow-up with cardiology; Dr. Gordan Latina   Urinary tract infection Patient reporting left flank pain, rigors prior to ED presentation.  Urinalysis with bacteria, pyuria and nitrates.  Patient completed 3-day course of IV ceftriaxone  with resolution of symptoms.  Urine culture positive for Enterococcus, however since patient has clinic improved with ceftriaxone  no need for further treatment at this point.  Acute renal failure on CKD stage IIIb Creatinine elevated at 3.10 on  admission, likely secondary to prerenal azotemia in the setting of nausea, vomiting, diarrhea, loss of  appetite and severe dehydration.  Support with IV fluids with creatinine back down to within normal limits. -- Cr 3.10>>1.09  Hyponatremia Na 130, likely secondary to volume depletion/dehydration.  Continue to encourage increased oral intake  Prolonged Qtc Repeat EKG reviewed, now resolved.   DVT prophylaxis: SCDs Start: 02/15/24 1540 apixaban  (ELIQUIS ) tablet 5 mg    Code Status: Full Code Family Communication: Updated family present at bedside this morning  Disposition Plan:  Level of care: Telemetry Medical Status is: Inpatient Remains inpatient appropriate because: Declined CR, needs SNF placement, TOC consulted    Consultants:  Orthopedic, Dr. Curtiss Dowdy Cardiology  Procedures:  ORIF right distal tibia fracture and nonoperative management of right distal fibular fracture, Dr. Curtiss Dowdy on 02/15/2024  Antimicrobials:  Ceftriaxone  4/17-4/19 Vancomycin  4/21-4/21 Linezolid  4/21-4/23 Cefazolin  4/21-4/22   Subjective: Patient seen examined bedside, lying in bed.  Family present.  Currently pain controlled with out movement.  Patient reports pain with any type of movement of her right foot.  Discussed with patient needs to mobilize and get out of bed to chair at least today.  Has been declined by CIR for admission, will need SNF placement; TOC consulted.  Patient with no asp other complaints, concerns or questions at this time.  Denies headache, no visual changes, no chest pain, no palpitations, no shortness of breath, no abdominal pain, no fever/chills/night sweats, no nausea/vomiting/diarrhea, no focal weakness, no fatigue, no paresthesias.  No acute events overnight per nurse.  Objective: Vitals:   02/16/24 2048 02/17/24 0407 02/17/24 0500 02/17/24 0735  BP: (!) 104/54 131/78  135/75  Pulse: 74 81  75  Resp: 17 17  16   Temp: 98.7 F (37.1 C) (!) 97.3 F (36.3 C)  98.1 F (36.7 C)  TempSrc: Oral Oral  Oral  SpO2: 94% 99%  98%  Weight:   89.2 kg   Height:         Intake/Output Summary (Last 24 hours) at 02/17/2024 1057 Last data filed at 02/17/2024 0600 Gross per 24 hour  Intake 760 ml  Output 550 ml  Net 210 ml   Filed Weights   02/15/24 0500 02/15/24 1011 02/17/24 0500  Weight: 88.5 kg 81.6 kg 89.2 kg    Examination:  Physical Exam: GEN: NAD, alert and oriented x 3, wd/wn HEENT: NCAT, PERRL, EOMI, sclera clear, MMM PULM: CTAB w/o wheezes/crackles, normal respiratory effort, on room air CV: RRR w/o M/G/R GI: abd soft, NTND, NABS, no R/G/M MSK: no peripheral edema, moves all extremities independently, noted right lower extremity with splint in place, Ace wrap in place, C/D/I, neurovascular intact NEURO: No focal neurological deficits PSYCH: normal mood/affect Integumentary: Right lower extremity with splint/surgical dressing/Ace wrap in place, clean/dry/intact, otherwise no other concerning rashes/lesions/wounds noted on exposed skin surfaces    Data Reviewed: I have personally reviewed following labs and imaging studies  CBC: Recent Labs  Lab 02/13/24 0231 02/14/24 0412 02/15/24 0152 02/16/24 0639 02/17/24 0646  WBC 13.6* 13.2* 10.1 15.0* 13.2*  HGB 12.6 13.4 12.0 13.5 12.9  HCT 39.1 42.2 37.9 42.4 39.6  MCV 88.5 89.6 89.0 88.7 87.2  PLT 321 340 402* 500* 550*   Basic Metabolic Panel: Recent Labs  Lab 02/11/24 1442 02/11/24 1449 02/12/24 0602 02/13/24 0231 02/14/24 0412 02/15/24 0152 02/16/24 0639 02/17/24 0646  NA 135   < > 135 132* 131* 130* 128* 130*  K 4.3   < > 4.6 4.1  4.7 4.2 4.6 4.0  CL 103   < > 107 104 102 103 98 97*  CO2 20*  --  20* 19* 19* 18* 18* 23  GLUCOSE 97   < > 123* 85 87 85 89 87  BUN 43*   < > 38* 33* 28* 24* 16 13  CREATININE 3.10*   < > 2.24* 1.51* 1.44* 1.21* 1.07* 1.09*  CALCIUM  8.2*  --  8.1* 8.1* 8.2* 8.3* 8.4* 8.3*  MG 1.7  --  2.9* 2.2  --   --   --   --    < > = values in this interval not displayed.   GFR: Estimated Creatinine Clearance: 45.6 mL/min (A) (by C-G formula based  on SCr of 1.09 mg/dL (H)). Liver Function Tests: Recent Labs  Lab 02/11/24 1442 02/17/24 0646  AST 30 17  ALT 11 6  ALKPHOS 64 51  BILITOT 0.8 0.7  PROT 5.7* 5.5*  ALBUMIN 2.5* 1.9*   No results for input(s): "LIPASE", "AMYLASE" in the last 168 hours. No results for input(s): "AMMONIA" in the last 168 hours. Coagulation Profile: Recent Labs  Lab 02/11/24 1442  INR 1.8*   Cardiac Enzymes: No results for input(s): "CKTOTAL", "CKMB", "CKMBINDEX", "TROPONINI" in the last 168 hours. BNP (last 3 results) No results for input(s): "PROBNP" in the last 8760 hours. HbA1C: No results for input(s): "HGBA1C" in the last 72 hours. CBG: No results for input(s): "GLUCAP" in the last 168 hours. Lipid Profile: No results for input(s): "CHOL", "HDL", "LDLCALC", "TRIG", "CHOLHDL", "LDLDIRECT" in the last 72 hours. Thyroid  Function Tests: No results for input(s): "TSH", "T4TOTAL", "FREET4", "T3FREE", "THYROIDAB" in the last 72 hours. Anemia Panel: No results for input(s): "VITAMINB12", "FOLATE", "FERRITIN", "TIBC", "IRON", "RETICCTPCT" in the last 72 hours. Sepsis Labs: Recent Labs  Lab 02/11/24 1450  LATICACIDVEN 1.7    Recent Results (from the past 240 hours)  Culture, blood (x 2)     Status: None   Collection Time: 02/11/24  7:20 PM   Specimen: BLOOD LEFT ARM  Result Value Ref Range Status   Specimen Description BLOOD LEFT ARM  Final   Special Requests   Final    BOTTLES DRAWN AEROBIC AND ANAEROBIC Blood Culture results may not be optimal due to an inadequate volume of blood received in culture bottles   Culture   Final    NO GROWTH 5 DAYS Performed at [facility], [address], [place], [place] [address]    Report Status 02/16/2024 FINAL  Final  Culture, blood (x 2)     Status: None   Collection Time: 02/12/24  6:31 AM   Specimen: BLOOD RIGHT HAND  Result Value Ref Range Status   Specimen Description BLOOD RIGHT HAND  Final   Special Requests   Final    AEROBIC  BOTTLE ONLY Blood Culture results may not be optimal due to an inadequate volume of blood received in culture bottles   Culture   Final    NO GROWTH 5 DAYS Performed at [facility], [address], [place], [place] [address]    Report Status 02/17/2024 FINAL  Final  Urine Culture (for pregnant, neutropenic or urologic patients or patients with an indwelling urinary catheter)     Status: Abnormal   Collection Time: 02/12/24  6:05 PM   Specimen: Urine, Clean Catch  Result Value Ref Range Status   Specimen Description URINE, CLEAN CATCH  Final   Special Requests   Final  NONE Performed at [facility], [address], [place], [place] [address]    Culture 20,000 COLONIES/mL ENTEROCOCCUS FAECALIS (A)  Final   Report Status 02/14/2024 FINAL  Final   Organism ID, Bacteria ENTEROCOCCUS FAECALIS (A)  Final      Susceptibility   Enterococcus faecalis - MIC*    AMPICILLIN >=32 RESISTANT Resistant     NITROFURANTOIN  <=16 SENSITIVE Sensitive     VANCOMYCIN  2 SENSITIVE Sensitive     * 20,000 COLONIES/mL ENTEROCOCCUS FAECALIS  Surgical pcr screen     Status: None   Collection Time: 02/14/24 11:42 PM   Specimen: Nasal Mucosa; Nasal Swab  Result Value Ref Range Status   MRSA, PCR NEGATIVE NEGATIVE Final   Staphylococcus aureus NEGATIVE NEGATIVE Final    Comment: (NOTE) The Xpert SA Assay (FDA approved for NASAL specimens in patients 3 years of age and older), is one component of a comprehensive surveillance program. It is not intended to diagnose infection nor to guide or monitor treatment. Performed at [facility], [address] [place], [place] [address]          Radiology Studies: DG Ankle Right Port Result Date: 02/15/2024 CLINICAL DATA:  Fracture, postop. EXAM: PORTABLE RIGHT ANKLE - 2 VIEW COMPARISON:  Preoperative imaging FINDINGS: Medial plate and screw fixation of distal tibial fracture. Improved fracture alignment from preoperative imaging. Distal  fibular fracture is also in improved alignment. Overlying splint material limits osseous and soft tissue fine detail. IMPRESSION: ORIF of distal tibial fracture. Electronically Signed   By: Chadwick Colonel M.D.   On: 02/15/2024 16:56   DG Ankle Complete Right Result Date: 02/15/2024 CLINICAL DATA:  Elective surgery. EXAM: RIGHT ANKLE - COMPLETE 3+ VIEW COMPARISON:  Preoperative imaging FINDINGS: Six fluoroscopic spot views of the right ankle submitted from the operating room. Plate and screw fixation of distal tibial fracture. Improved alignment of distal fibular fracture. Fluoroscopy time 1 minutes 25 seconds. Dose 1.75 mGy. IMPRESSION: Intraoperative fluoroscopy during distal tibial fracture fixation. Electronically Signed   By: Chadwick Colonel M.D.   On: 02/15/2024 16:55   DG C-Arm 1-60 Min-No Report Result Date: 02/15/2024 Fluoroscopy was utilized by the requesting physician.  No radiographic interpretation.   DG C-Arm 1-60 Min-No Report Result Date: 02/15/2024 Fluoroscopy was utilized by the requesting physician.  No radiographic interpretation.        Scheduled Meds:  amiodarone   200 mg Oral BID   Followed by   Cecily Cohen ON 02/20/2024] amiodarone   200 mg Oral Daily   apixaban   5 mg Oral BID   carvedilol   3.125 mg Oral BID WC   losartan   12.5 mg Oral Daily   pantoprazole   40 mg Oral Daily   senna-docusate  2 tablet Oral BID   sodium chloride  flush  3 mL Intravenous Q12H   Continuous Infusions:   LOS: 6 days    Time spent: 48 minutes spent on 02/17/2024 caring for this patient face-to-face including chart review, ordering labs/tests, documenting, discussion with nursing staff, consultants, updating family and interview/physical exam    Rema [facility] [place], DO [facility] Available via Epic secure chat 7am-7pm After these hours, please refer to coverage provider listed on [email or web address] 02/17/2024, 10:57 AM

## 2024-02-17 NOTE — NC FL2 (Signed)
 [place]  MEDICAID FL2 LEVEL OF CARE FORM     IDENTIFICATION  Patient Name: [name] [name] Birthdate: 1941-03-21 Sex: female Admission Date (Current Location): 02/11/2024  [facility] and [place] Number:  Producer, television/film/video and Address:  [facility] [place], [address] [place], [place] [address]      Provider Number: [id number]  Attending Physician Name and Address:  U[place], Eric J, DO  Relative Name and Phone Number:  Eager,Harold (Spouse)  [phone] (Mobile)    Current Level of Care: Hospital Recommended Level of Care: Skilled Nursing Facility Prior Approval Number:    Date Approved/Denied:   PASRR Number: [id number]  Discharge Plan: SNF    Current Diagnoses: Patient Active Problem List   Diagnosis Date Noted   NSVT (nonsustained ventricular tachycardia) (HCC) 02/11/2024   Closed fracture of distal end of right fibula and tibia 02/11/2024   Sepsis secondary to UTI (HCC) 02/11/2024   Complicated UTI (urinary tract infection) 03/31/2021   Cancer (HCC)    Complication of anesthesia    Hypertension    Essential thrombocythemia (HCC) 03/04/2021   Ventricular ectopy 12/28/2020   Presence of pessary    Wears glasses    Uses walker    Right wrist fracture    PAF (paroxysmal atrial fibrillation) (HCC)    Lumbar compression fracture (HCC)    Iron deficiency anemia    Hyperlipidemia    History of uterine cancer    History of septic shock    History of peptic ulcer disease    History of kidney infection    History of kidney stones    History of gastric polyp    History of colon polyps    History of arteriovenostomy for renal dialysis (HCC)    Hiatal hernia    Hematuria    GERD (gastroesophageal reflux disease)    General weakness    First degree heart block    Cystocele, midline    Chronic diastolic (congestive) heart failure (HCC)    Anticoagulant long-term use    Nonfunctioning kidney 03/23/2020   Urinary tract infection without  hematuria    Occlusion of ureteral stent (HCC)    Chronic anticoagulation 08/04/2017   Coronary artery disease 06/16/2017   Encounter for monitoring flecainide  therapy 07/30/2016   Ureteral stricture, right 05/07/2016   Sepsis (HCC) 05/07/2016   Hydronephrosis 05/07/2016   Diverticulosis 05/07/2016   Abdominal aortic atherosclerosis (HCC) 05/07/2016   Congenital vaginal enterocele 04/15/2016   Female genital prolapse 04/15/2016   High risk medication use 04/15/2016   Hyperlipemia 04/15/2016   Midline cystocele 04/15/2016   Palpitations 04/15/2016   Tendonitis, Achilles 04/15/2016   Paroxysmal atrial fibrillation (HCC) 05/31/2015   Chronic diastolic congestive heart failure (HCC) 05/31/2015   Dyslipidemia 05/31/2015   Vomiting and diarrhea 06/11/2013   E coli bacteremia 06/08/2013   Anemia, unspecified 06/08/2013   NSTEMI, initial episode of care (HCC) 06/07/2013   Septic shock (HCC) 06/04/2013   Acute respiratory failure with hypoxia (HCC) 06/04/2013   Pyelonephritis of left kidney 06/04/2013   Ureteral obstruction, right 06/04/2013   Acute renal failure superimposed on stage 3b chronic kidney disease (HCC) 06/04/2013   Lactic acidosis 06/04/2013   History of ventilator dependency 2014   Chronic kidney disease 2014    Orientation RESPIRATION BLADDER Height & Weight     Self, Time, Situation, Place  Normal Continent Weight: 196 lb 10.4 oz (89.2 kg) Height:  5\' 7"  (170.2 cm)  BEHAVIORAL SYMPTOMS/MOOD NEUROLOGICAL BOWEL NUTRITION STATUS  Continent Diet (see d/c summary)  AMBULATORY STATUS COMMUNICATION OF NEEDS Skin   Extensive Assist Verbally PU Stage and Appropriate Care (pressure injury right thigh stage 2)                       Personal Care Assistance Level of Assistance  Bathing, Feeding, Total care, Dressing Bathing Assistance: Maximum assistance Feeding assistance: Independent Dressing Assistance: Limited assistance     Functional Limitations Info   Sight, Hearing, Speech Sight Info: Impaired Hearing Info: Adequate Speech Info: Adequate    SPECIAL CARE FACTORS FREQUENCY  PT (By licensed PT), OT (By licensed OT)     PT Frequency: 5x/week OT Frequency: 5x/week            Contractures Contractures Info: Not present    Additional Factors Info  Code Status, Allergies Code Status Info: full code Allergies Info: codeine, hydrocodone            Current Medications (02/17/2024):  This is the current hospital active medication list Current Facility-Administered Medications  Medication Dose Route Frequency Provider Last Rate Last Admin   acetaminophen  (TYLENOL ) tablet 650 mg  650 mg Oral Q6H PRN Versie Gores, PA-C   650 mg at 02/13/24 1749   Or   acetaminophen  (TYLENOL ) suppository 650 mg  650 mg Rectal Q6H PRN Versie Gores, PA-C       amiodarone  (PACERONE ) tablet 200 mg  200 mg Oral BID Versie Gores, PA-C   200 mg at 02/17/24 1610   Followed by   Cecily Cohen ON 02/20/2024] amiodarone  (PACERONE ) tablet 200 mg  200 mg Oral Daily Versie Gores, PA-C       apixaban  (ELIQUIS ) tablet 5 mg  5 mg Oral BID Dang, Thuy D, RPH   5 mg at 02/17/24 9604   carvedilol  (COREG ) tablet 3.125 mg  3.125 mg Oral BID WC Versie Gores, PA-C   3.125 mg at 02/17/24 0630   fentaNYL  (SUBLIMAZE ) injection 12.5-50 mcg  12.5-50 mcg Intravenous Q2H PRN Versie Gores, PA-C   50 mcg at 02/16/24 1719   losartan  (COZAAR ) tablet 12.5 mg  12.5 mg Oral Daily Johnson, Kathleen R, PA-C   12.5 mg at 02/17/24 5409   methocarbamol  (ROBAXIN ) tablet 500 mg  500 mg Oral Q6H PRN Versie Gores, PA-C   500 mg at 02/16/24 2112   Or   methocarbamol  (ROBAXIN ) injection 500 mg  500 mg Intravenous Q6H PRN Versie Gores, PA-C       metoCLOPramide  (REGLAN ) tablet 5-10 mg  5-10 mg Oral Q8H PRN Versie Gores, PA-C       Or   metoCLOPramide  (REGLAN ) injection 5-10 mg  5-10 mg Intravenous Q8H PRN Versie Gores, PA-C       ondansetron  (ZOFRAN ) tablet 4 mg  4 mg  Oral Q6H PRN Jonelle Neri, Sarah A, PA-C       Or   ondansetron  (ZOFRAN ) injection 4 mg  4 mg Intravenous Q6H PRN McClung, Sarah A, PA-C       oxyCODONE  (Oxy IR/ROXICODONE ) immediate release tablet 2.5-5 mg  2.5-5 mg Oral Q4H PRN Versie Gores, PA-C   5 mg at 02/16/24 2115   pantoprazole  (PROTONIX ) EC tablet 40 mg  40 mg Oral Daily [name] A, PA-C   40 mg at 02/17/24 8119   polyethylene glycol (MIRALAX  / GLYCOLAX ) packet 17 g  17 g Oral Daily PRN Versie Gores, PA-C   17 g at 02/17/24 0634  senna-docusate (Senokot-S) tablet 1 tablet  1 tablet Oral BID [place], Eric J, DO       sodium chloride  flush (NS) 0.9 % injection 3 mL  3 mL Intravenous Q12H Versie Gores, PA-C   3 mL at 02/17/24 6962   trimethobenzamide  (TIGAN ) injection 200 mg  200 mg Intramuscular Q6H PRN Versie Gores, PA-C   200 mg at 02/13/24 9528     Discharge Medications: Please see discharge summary for a list of discharge medications.  Relevant Imaging Results:  Relevant Lab Results:   Additional Information SSN [id number] [place], [place]

## 2024-02-17 NOTE — Progress Notes (Signed)
 Orthopaedic Trauma Progress Note  SUBJECTIVE: Doing okay this AM. No pain at rest but more significant when mobilizing.  OBJECTIVE:  Vitals:   02/17/24 0407 02/17/24 0735  BP: 131/78 135/75  Pulse: 81 75  Resp: 17 16  Temp: (!) 97.3 F (36.3 C) 98.1 F (36.7 C)  SpO2: 99% 98%    Opiates Today (MME): Today's  total administered Morphine  Milligram Equivalents: 0 Opiates Yesterday (MME): Yesterday's total administered Morphine  Milligram Equivalents: 52.5  General: Sitting up in bed, NAD Respiratory: No increased work of breathing.  Operative Extremity (RLE): Well padded, well fitting splint in place.  Nontender above splint.  Endorses sensation to light touch over the toes.  Able to wiggle the toes.  Warm well-perfused toes  IMAGING: Stable post op imaging.   LABS:  Results for orders placed or performed during the hospital encounter of 02/11/24 (from the past 24 hours)  CBC     Status: Abnormal   Collection Time: 02/17/24  6:46 AM  Result Value Ref Range   WBC 13.2 (H) 4.0 - 10.5 K/uL   RBC 4.54 3.87 - 5.11 MIL/uL   Hemoglobin 12.9 12.0 - 15.0 g/dL   HCT 40.9 81.1 - 91.4 %   MCV 87.2 80.0 - 100.0 fL   MCH 28.4 26.0 - 34.0 pg   MCHC 32.6 30.0 - 36.0 g/dL   RDW 78.2 95.6 - 21.3 %   Platelets 550 (H) 150 - 400 K/uL   nRBC 0.0 0.0 - 0.2 %  Comprehensive metabolic panel with GFR     Status: Abnormal   Collection Time: 02/17/24  6:46 AM  Result Value Ref Range   Sodium 130 (L) 135 - 145 mmol/L   Potassium 4.0 3.5 - 5.1 mmol/L   Chloride 97 (L) 98 - 111 mmol/L   CO2 23 22 - 32 mmol/L   Glucose, Bld 87 70 - 99 mg/dL   BUN 13 8 - 23 mg/dL   Creatinine, Ser 0.86 (H) 0.44 - 1.00 mg/dL   Calcium  8.3 (L) 8.9 - 10.3 mg/dL   Total Protein 5.5 (L) 6.5 - 8.1 g/dL   Albumin 1.9 (L) 3.5 - 5.0 g/dL   AST 17 15 - 41 U/L   ALT 6 0 - 44 U/L   Alkaline Phosphatase 51 38 - 126 U/L   Total Bilirubin 0.7 0.0 - 1.2 mg/dL   GFR, Estimated 51 (L) >60 mL/min   Anion gap 10 5 - 15     ASSESSMENT: [name] [name] is a 83 y.o. female, 2 Days Post-Op s/p mechanical fall Procedures: OPEN REDUCTION INTERNAL FIXATION RIGHT TIBIA/FIBULA FRACTURE  CV/Blood loss: Acute blood loss anemia, Hgb 13.5 this AM. Hemodynamically stable  PLAN: Weightbearing: NWB RLE ROM: Maintain short leg splint.  Okay for knee motion Incisional and dressing care: Dressings left intact until follow-up  Showering: Okay to shower, keep splint covered and dry Orthopedic device(s): Splint RLE Pain management:  1. Tylenol  650 mg q 6 hours PRN 2. Robaxin  500 mg q 6 hours PRN 3. Oxycodone  2.5-5 mg q 4 hours PRN 4. Dilaudid  0.5-1 mg q 4 hours PRN VTE prophylaxis: Okay to resume home dose Eliquis  today. SCDs ID:  Ancef  2gm post op Foley/Lines:  No foley, KVO IVFs Impediments to Fracture Healing: Vitamin D  level 10, start on supplementation Dispo: PT/OT evaluation today, dispo pending.  Patient not interested in SNF at this point.  Would like to return home.  Okay for discharge from ortho standpoint once cleared by medicine team  and therapies  D/C recommendations: - Oxycodone  2.5 mg, Tylenol , Robaxin  for pain control - Home dose Eliquis  for DVT prophylaxis - Continue 50,000 units Vit D supplementation q. 7 days x 8 weeks  Follow - up plan: 2 weeks after d/c for splint removal, wound check, and repeat x-rays   Contact information:  [name] MD, [name] PA-C. After hours and holidays please check [email or web address] for group call information for [facility]

## 2024-02-17 NOTE — TOC Initial Note (Signed)
 Transition of Care [facility]) - Initial/Assessment Note    Patient Details  Name: [name] [name] MRN: [id number] Date of Birth: 1941-09-15  [facility] CM/SW Contact:    Katrinka Parr, LCSW Phone Number: 02/17/2024, 11:52 AM  Clinical Narrative:                  CSW met with pt and pt's souse to discuss SNF at DC. Pt has been to [facility] and had a negative experience there. This was during a time with more covid restrictions so husband was unable to visit pt at the time. Pt and husband do not want to pursue any SNF's in [place] area, they would like to look into [place] area options. CSW completed fl2 and faxed bed requests in hub. Will follow up with SNF bed offers.   Expected Discharge Plan: Skilled Nursing Facility Barriers to Discharge: Insurance Authorization, SNF Pending bed offer            Prior Living Arrangements/Services     Patient language and need for interpreter reviewed:: Yes        Need for Family Participation in Patient Care: Yes (Comment) Care giver support system in place?: Yes (comment)   Criminal Activity/Legal Involvement Pertinent to Current Situation/Hospitalization: No - Comment as needed  Activities of Daily Living   ADL Screening (condition at time of admission) Independently performs ADLs?: No Does the patient have a NEW difficulty with bathing/dressing/toileting/self-feeding that is expected to last >3 days?: Yes (Initiates electronic notice to provider for possible OT consult) Does the patient have a NEW difficulty with getting in/out of bed, walking, or climbing stairs that is expected to last >3 days?: Yes (Initiates electronic notice to provider for possible PT consult) Does the patient have a NEW difficulty with communication that is expected to last >3 days?: No Is the patient deaf or have difficulty hearing?: No Does the patient have difficulty seeing, even when wearing glasses/contacts?: No Does the patient have  difficulty concentrating, remembering, or making decisions?: No  Permission Sought/Granted   Permission granted to share information with : Yes, Verbal Permission Granted  Share Information with NAME: Brostrom,Harold (Spouse)  [phone] (Mobile)           Emotional Assessment Appearance:: Appears stated age Attitude/Demeanor/Rapport: Engaged Affect (typically observed): Accepting, Calm Orientation: : Oriented to Self, Oriented to Place, Oriented to  Time, Oriented to Situation Alcohol / Substance Use: Not Applicable Psych Involvement: No (comment)  Admission diagnosis:  NSVT (nonsustained ventricular tachycardia) (HCC) [I47.29] Patient Active Problem List   Diagnosis Date Noted   NSVT (nonsustained ventricular tachycardia) (HCC) 02/11/2024   Closed fracture of distal end of right fibula and tibia 02/11/2024   Sepsis secondary to UTI (HCC) 02/11/2024   Complicated UTI (urinary tract infection) 03/31/2021   Cancer (HCC)    Complication of anesthesia    Hypertension    Essential thrombocythemia (HCC) 03/04/2021   Ventricular ectopy 12/28/2020   Presence of pessary    Wears glasses    Uses walker    Right wrist fracture    PAF (paroxysmal atrial fibrillation) (HCC)    Lumbar compression fracture (HCC)    Iron deficiency anemia    Hyperlipidemia    History of uterine cancer    History of septic shock    History of peptic ulcer disease    History of kidney infection    History of kidney stones    History of gastric polyp    History of  colon polyps    History of arteriovenostomy for renal dialysis (HCC)    Hiatal hernia    Hematuria    GERD (gastroesophageal reflux disease)    General weakness    First degree heart block    Cystocele, midline    Chronic diastolic (congestive) heart failure (HCC)    Anticoagulant long-term use    Nonfunctioning kidney 03/23/2020   Urinary tract infection without hematuria    Occlusion of ureteral stent (HCC)    Chronic  anticoagulation 08/04/2017   Coronary artery disease 06/16/2017   Encounter for monitoring flecainide  therapy 07/30/2016   Ureteral stricture, right 05/07/2016   Sepsis (HCC) 05/07/2016   Hydronephrosis 05/07/2016   Diverticulosis 05/07/2016   Abdominal aortic atherosclerosis (HCC) 05/07/2016   Congenital vaginal enterocele 04/15/2016   Female genital prolapse 04/15/2016   High risk medication use 04/15/2016   Hyperlipemia 04/15/2016   Midline cystocele 04/15/2016   Palpitations 04/15/2016   Tendonitis, Achilles 04/15/2016   Paroxysmal atrial fibrillation (HCC) 05/31/2015   Chronic diastolic congestive heart failure (HCC) 05/31/2015   Dyslipidemia 05/31/2015   Vomiting and diarrhea 06/11/2013   E coli bacteremia 06/08/2013   Anemia, unspecified 06/08/2013   NSTEMI, initial episode of care (HCC) 06/07/2013   Septic shock (HCC) 06/04/2013   Acute respiratory failure with hypoxia (HCC) 06/04/2013   Pyelonephritis of left kidney 06/04/2013   Ureteral obstruction, right 06/04/2013   Acute renal failure superimposed on stage 3b chronic kidney disease (HCC) 06/04/2013   Lactic acidosis 06/04/2013   History of ventilator dependency 2014   Chronic kidney disease 2014   PCP:  Gaither Juba, MD Pharmacy:   [facility] - [place], [place] - [address] [place] [address] Phone: [phone] Fax: [phone]  [facility] [address] [place] [address] Phone: [phone] Fax: [phone]     Social Drivers of Health (SDOH) Social History: SDOH Screenings   Food Insecurity: No Food Insecurity (02/12/2024)  Housing: Low Risk  (02/12/2024)  Transportation Needs: No Transportation Needs (02/16/2024)  Utilities: Not At Risk (02/12/2024)  Alcohol Screen: Low Risk  (02/16/2024)  Depression (PHQ2-9): Low Risk  (01/19/2019)  Financial Resource Strain: Low Risk  (02/16/2024)  Social Connections: Socially Integrated (02/12/2024)  Tobacco  Use: Low Risk  (02/15/2024)   SDOH Interventions: Transportation Interventions: Intervention Not Indicated Alcohol Usage Interventions: Intervention Not Indicated (Score <7) Financial Strain Interventions: Intervention Not Indicated   Readmission Risk Interventions     No data to display

## 2024-02-17 NOTE — Progress Notes (Addendum)
 Physical Therapy Treatment Patient Details Name: [name] [name] MRN: [id number] DOB: 1940/11/09 Today's Date: 02/17/2024   History of Present Illness Pt is an 83 y/o female presenting on 4/17 with R ankle pain after fall at home. With NSTEMI and per cardiology likely stress induced cardiomyopathy. 4/21 S/P ORIF R distal tibia fx, non op management of R distal fibula fx.  Also found with UTI, AKI. PMH - HTN, ckd, chf, cad, TKA, lumbar fusion, rt hip fx with hemiarthroplasty.    PT Comments  Patient resting in bed and at start of session hesitant to participate in therapy expressing concerns that it is too soon to mobilize. Discussed orders from physician to progress and work to OOB mobility with ultimate goal of pt returning home. Pt agreeable with encouragement and education. Pt required step by step cues for sequencing LE's to EOB and use of bed rail to turn trunk and pres sup, Mod+2 for supine>sit. Pt able to maintain seated balance with bil UE support at EOB. Cues for NWB on Rt LE and safe technique to attempt stand with RW. EOB elevated and Max+2 provided for ~25% rise before return to sitting. Transfer training transitioned to slide board with lateral scoot. Pt required max set up assist and cues for technique/hand position. Min+2 with use of bed pad to guide direction for bed>chair. Pt able to completed ~50% rise with bil UE on arm rest and power up through Lt LE with mod assist to reposition linens and center hips in recliner. EOS discussed benefits of time OOB and LE's elevated for edema and pain control; RN notified of pt request for pain meds. Alarm on and call bell within reach, family present. Patient will benefit from continued inpatient follow up therapy, <3 hours/day. Will continue to progress pt as able.     If plan is discharge home, recommend the following: Two people to help with walking and/or transfers;Two people to help with bathing/dressing/bathroom;Assistance with  cooking/housework;Assist for transportation;Help with stairs or ramp for entrance   Can travel by private vehicle     No  Equipment Recommendations  Wheelchair (measurements PT);Wheelchair cushion (measurements PT);Other (comment) (slideboard)    Recommendations for Other Services       Precautions / Restrictions Precautions Precautions: Fall Recall of Precautions/Restrictions: Intact Required Braces or Orthoses: Splint/Cast Splint/Cast: Rt LE short leg splint Splint/Cast - Date Prophylactic Dressing Applied (if applicable): 02/15/24 Restrictions Weight Bearing Restrictions Per Provider Order: Yes RLE Weight Bearing Per Provider Order: Non weight bearing     Mobility  Bed Mobility Overal bed mobility: Needs Assistance Bed Mobility: Supine to Sit     Supine to sit: Mod assist, Used rails, +2 for safety/equipment     General bed mobility comments: Simple cues for sequecning LE's to EOB and UE reaching to bed rail for supine>sit. Mod+2 to fully raise trunk upright. Bed pad to scoots hips anterior to get Lt foot on floor. Pt able to maintain seated balance with bil UE.    Transfers Overall transfer level: Needs assistance Equipment used: Rolling walker (2 wheels), Sliding board Transfers: Sit to/from Stand, Bed to chair/wheelchair/BSC Sit to Stand: Max assist, +2 physical assistance, +2 safety/equipment          Lateral/Scoot Transfers: With slide board, Min assist, +2 safety/equipment General transfer comment: EOB elevated and cues for hand placement with RW for attempted sit<>stand. Pt required Max+2 for partial lift ~25% stand, maintained NWB on Rt LE well. Ultimately slide board transfer utilized for bed>chair. Cues for  lateral lean for set up and cues for hand placement to guide transfer direction. Pt maintained NWB on Rt LE with good use of bil UE and Lt LE to lift and scoot towards Lt. Min assist with bed pad to guide turn and min assist at hip to fully center in  recliner. Pt able to complete ~50% squat/lift to remove bunched up pad with mod assist.    Ambulation/Gait                   Stairs             Wheelchair Mobility     Tilt Bed    Modified Rankin (Stroke Patients Only)       Balance Overall balance assessment: History of Falls, Needs assistance Sitting-balance support: Bilateral upper extremity supported, Feet supported Sitting balance-Leahy Scale: Poor Sitting balance - Comments: reliant on UE for balance   Standing balance support: Bilateral upper extremity supported, Reliant on assistive device for balance Standing balance-Leahy Scale: Zero Standing balance comment: max +2 for partial stand with Rw                            Communication Communication Communication: No apparent difficulties  Cognition Arousal: Alert Behavior During Therapy: WFL for tasks assessed/performed, Anxious   PT - Cognitive impairments: No apparent impairments                       PT - Cognition Comments: pt fearful of mobilizing due to pain and suspect fear of falling. Following commands: Intact      Cueing Cueing Techniques: Verbal cues  Exercises      General Comments        Pertinent Vitals/Pain Pain Assessment Pain Assessment: Faces Faces Pain Scale: Hurts even more Pain Location: Rt LE Pain Descriptors / Indicators: Discomfort, Operative site guarding, Grimacing Pain Intervention(s): Limited activity within patient's tolerance, Monitored during session, Repositioned, Patient requesting pain meds-RN notified    Home Living                          Prior Function            PT Goals (current goals can now be found in the care plan section) Acute Rehab PT Goals Patient Stated Goal: get to rehab and home PT Goal Formulation: With patient/family Time For Goal Achievement: 03/01/24 Potential to Achieve Goals: Good Progress towards PT goals: Progressing toward goals     Frequency    Min 2X/week      PT Plan      Co-evaluation              AM-PAC PT "6 Clicks" Mobility   Outcome Measure  Help needed turning from your back to your side while in a flat bed without using bedrails?: A Lot Help needed moving from lying on your back to sitting on the side of a flat bed without using bedrails?: A Lot Help needed moving to and from a bed to a chair (including a wheelchair)?: A Lot (slideboard, min +2) Help needed standing up from a chair using your arms (e.g., wheelchair or bedside chair)?: Total Help needed to walk in hospital room?: Total Help needed climbing 3-5 steps with a railing? : Total 6 Click Score: 9    End of Session Equipment Utilized During Treatment: Gait belt Activity Tolerance: Patient limited by pain;Patient tolerated  treatment well Patient left: in chair;with call bell/phone within reach;with chair alarm set;with family/visitor present Nurse Communication: Mobility status;Need for lift equipment (slideboard+2) PT Visit Diagnosis: Other abnormalities of gait and mobility (R26.89);Muscle weakness (generalized) (M62.81)     Time: 9562-1308 PT Time Calculation (min) (ACUTE ONLY): 30 min  Charges:    $Therapeutic Activity: 23-37 mins PT General Charges $$ ACUTE PT VISIT: 1 Visit                     Tish Forge, DPT Acute Rehabilitation Services Office [phone]  02/17/24 11:30 AM

## 2024-02-17 NOTE — Progress Notes (Signed)
   Heart Failure Stewardship Pharmacist Progress Note   PCP: Gaither Juba, MD PCP-Cardiologist: Ralene Burger, MD    HPI:  83 yo F with PMH of afib, CHF, HTN, PVCs, R nephrectomy due to multiple kidney stones, thrombocytopenia, and 1st degree heart block.   Presented to the ED on 4/17 after a fall when she was attempting to get into the passenger side of the car. She was reduced in the field. Orthopedic surgery consulted and s/p ORIF on 4/21. Cardiology consulted due to runs of NSVT. Received IV amiodarone  and magnesium . ECHO on 4/18 with LVEF 25-30%, wall motion consistent with Takotsubo vs CAD, RV normal, trivial MR. Troponin was 1228>755. Felt to be most consistent with stress cardiomyopathy.   Met with patient, her husband, and her brother at bedside today. She is up sitting in the chair and legs are elevated. She is still in a bit of pain from the surgery and needed assistance sliding from the bed to the chair. No shortness of breath or edema noted. Reviewed changes to her heart medications. All questions and concerns answered. She is planning to discharge to SNF rehab, she was declined for CIR. Requests medications be sent to [facility] at discharge.   Current HF Medications: Beta Blocker: carvedilol  3.125 mg BID ACE/ARB/ARNI: losartan  12.5 mg daily  Prior to admission HF Medications: None *also on flecainide  PTA  Pertinent Lab Values: Serum creatinine 1.09, BUN 13, Potassium 4.0, Sodium 130, Magnesium  2.2   Vital Signs: Weight: 196 lbs (admission weight: 188 lbs) - bed weight Blood pressure: 130/70s  Heart rate: 70s  I/O: net +0.4L yesterday; net +1L since admission  Medication Assistance / Insurance Benefits Check: Does the patient have prescription insurance?  Yes Type of insurance plan: [employer]  Outpatient Pharmacy:  Prior to admission outpatient pharmacy: [facility] Is the patient willing to use [facility] TOC pharmacy at discharge? Yes Is the patient  willing to transition their outpatient pharmacy to utilize a [facility] outpatient pharmacy?   No    Assessment: 1. Acute on chronic systolic CHF (LVEF 25-30%), due to Takotsubo cardiomyopathy. NYHA class II symptoms. - Euvolemic on exam. No diuretics currently. - Continue carvedilol  3.125 mg BID - Continue losartan  12.5 mg daily. Could consider increasing to 25 mg daily if BP remains stable.  - Consider adding spironolactone at follow up - need to be mindful of kidney disease - Deferring SGLT2i while undergoing treatment for UTI and kidney disease as mentioned above - Continue to hold flecainide  as she is now on amiodarone  and with new reduced EF   Plan: 1) Medication changes recommended at this time: - Increase losartan  to 25 mg daily if BP remains stable  2) Patient assistance: - None pending at this time  3)  Education  - Patient has been educated on current HF medications and potential additions to HF medication regimen - Patient verbalizes understanding that over the next few months, these medication doses may change and more medications may be added to optimize HF regimen - Patient has been educated on basic disease state pathophysiology and goals of therapy   [name] [name], PharmD, BCPS Heart Failure Stewardship Pharmacist Phone [phone]

## 2024-02-17 NOTE — Plan of Care (Signed)
   Problem: Activity: Goal: Risk for activity intolerance will decrease Outcome: Progressing   Problem: Nutrition: Goal: Adequate nutrition will be maintained Outcome: Progressing   Problem: Coping: Goal: Level of anxiety will decrease Outcome: Progressing   Problem: Pain Managment: Goal: General experience of comfort will improve and/or be controlled Outcome: Progressing

## 2024-02-17 NOTE — Discharge Instructions (Addendum)
 [name] Pane, MD [name] PA-C [facility] [address] [phone] Theone Fitting)   [phone] (fax)                                  POST-OPERATIVE INSTRUCTIONS     WEIGHT BEARING STATUS: Non-weightbearing right lower extremity  RANGE OF MOTION/ACTIVITY:  ok for knee motion. Maintain splint to the lower leg  WOUND CARE Please keep splint clean dry and intact until follow-up. If your splint gets wet for any reason please contact the office immediately.  Do not stick anything down your splint such as pencils, momey, hangers to try and scratch yourself.  If you feel itchy take Benadryl  as prescribed on the bottle for itching You may shower on Post-Op Day #2.  You must keep splint dry during this process and may find that a plastic bag taped around the extremity or alternatively a towel based bath may be a better option.   If you get your splint wet or if it is damaged please contact our clinic.  EXERCISES Due to your splint being in place you will not be able to bear weight through your extremity.   DO NOT PUT ANY WEIGHT ON YOUR OPERATIVE LEG Please use crutches or a walker to avoid weight bearing.   DVT/PE prophylaxis:  Home dose Eliquis   DIET: As you were eating previously.  Can use over the counter stool softeners and bowel preparations, such as Miralax , to help with bowel movements.  Narcotics can be constipating.  Be sure to drink plenty of fluids  REGIONAL ANESTHESIA (NERVE BLOCKS) The anesthesia team may have performed a nerve block for you if safe in the setting of your care.  This is a great tool used to minimize pain.  Typically the block may start wearing off overnight but the long acting medicine may last for 3-4 days.  The nerve block wearing off can be a challenging period but please utilize your as needed pain medications to try and manage this period.    POST-OP MEDICATIONS- Multimodal approach to pain control  In general your pain will  be controlled with a combination of substances.  Prescriptions unless otherwise discussed are electronically sent to your pharmacy.  This is a carefully made plan we use to minimize narcotic use.     - Acetaminophen  - Non-narcotic pain medicine taken on a scheduled basis   - Oxycodone  - This is a strong narcotic, to be used only on an "as needed" basis for pain.  -  Eliquis  - This medicine is used to minimize the risk of blood clots after surgery.  FOLLOW-UP If you develop a Fever (>101.5), Redness or Drainage from the surgical incision site, please call our office to arrange for an evaluation. Please call the office to schedule a follow-up appointment for your incision check if you do not already have one, 7-10 days post-operatively.   VISIT OUR WEBSITE FOR ADDITIONAL INFORMATION: [email or web address]   HELPFUL INFORMATION  If you had a block, it will wear off between 8-24 hrs postop typically.  This is period when your pain may go from nearly zero to the pain you would have had postop without the block.  This is an abrupt transition but nothing dangerous is happening.  You may take an extra dose of narcotic when this happens.  You should wean off your narcotic medicines as soon as you are able.  Most patients  will be off or using minimal narcotics before their first postop appointment.   We suggest you use the pain medication the first night prior to going to bed, in order to ease any pain when the anesthesia wears off. You should avoid taking pain medications on an empty stomach as it will make you nauseous.  Do not drink alcoholic beverages or take illicit drugs when taking pain medications.  In most states it is against the law to drive while you are in a splint or sling.  And certainly against the law to drive while taking narcotics.  You may return to work/school in the next couple of days when you feel up to it.   Pain medication may make you constipated.  Below are a few solutions  to try in this order: Decrease the amount of pain medication if you aren't having pain. Drink lots of decaffeinated fluids. Drink prune juice and/or each dried prunes  If the first 3 don't work start with additional solutions Take Colace - an over-the-counter stool softener Take Senokot - an over-the-counter laxative Take Miralax  - a stronger over-the-counter laxative

## 2024-02-18 DIAGNOSIS — I4729 Other ventricular tachycardia: Secondary | ICD-10-CM | POA: Diagnosis not present

## 2024-02-18 MED ORDER — OXYCODONE HCL 5 MG PO TABS
2.5000 mg | ORAL_TABLET | ORAL | 0 refills | Status: AC | PRN
Start: 2024-02-18 — End: ?

## 2024-02-18 MED ORDER — LORAZEPAM 0.5 MG PO TABS
0.5000 mg | ORAL_TABLET | Freq: Three times a day (TID) | ORAL | Status: DC | PRN
  Administered 2024-02-18 – 2024-02-20 (×5): 0.5 mg via ORAL
  Filled 2024-02-18 (×5): qty 1

## 2024-02-18 MED ORDER — METHOCARBAMOL 500 MG PO TABS: 500.0000 mg | ORAL_TABLET | Freq: Three times a day (TID) | ORAL | 0 refills | Status: AC | PRN

## 2024-02-18 NOTE — Plan of Care (Signed)
  Problem: Health Behavior/Discharge Planning: Goal: Ability to manage health-related needs will improve Outcome: Progressing   Problem: Clinical Measurements: Goal: Diagnostic test results will improve Outcome: Progressing Goal: Respiratory complications will improve Outcome: Progressing   Problem: Nutrition: Goal: Adequate nutrition will be maintained Outcome: Progressing   Problem: Pain Managment: Goal: General experience of comfort will improve and/or be controlled Outcome: Progressing   Problem: Coping: Goal: Level of anxiety will decrease Outcome: Not Progressing

## 2024-02-18 NOTE — Plan of Care (Signed)

## 2024-02-18 NOTE — Progress Notes (Signed)
 Per MD order to wean patient off oxygen. Oxygen down to 0.5 liters tolerated well O2 at 98%. After one hour, oxygen was removed, patient on room air O2 97%, no evidence of labored breathing. Patient tolerating well.

## 2024-02-18 NOTE — Progress Notes (Signed)
   Heart Failure Stewardship Pharmacist Progress Note   PCP: Gaither Juba, MD PCP-Cardiologist: Ralene Burger, MD    HPI:  83 yo F with PMH of afib, CHF, HTN, PVCs, R nephrectomy due to multiple kidney stones, thrombocytopenia, and 1st degree heart block.   Presented to the ED on 4/17 after a fall when she was attempting to get into the passenger side of the car. She was reduced in the field. Orthopedic surgery consulted and s/p ORIF on 4/21. Cardiology consulted due to runs of NSVT. Received IV amiodarone  and magnesium . ECHO on 4/18 with LVEF 25-30%, wall motion consistent with Takotsubo vs CAD, RV normal, trivial MR. Troponin was 1228>755. Felt to be most consistent with stress cardiomyopathy.   Met with patient and her husband at bedside today. She is laying in bed and stating she would like some rest. Still denying shortness of breath. Requesting something for anxiety. She is planning to discharge to SNF rehab when ready, she was declined for CIR. Requests medications be sent to [facility] at discharge.   Current HF Medications: Beta Blocker: carvedilol  3.125 mg BID ACE/ARB/ARNI: losartan  12.5 mg daily  Prior to admission HF Medications: None *also on flecainide  PTA  Pertinent Lab Values: No new labs 4/24 As of 4/23: Serum creatinine 1.09, BUN 13, Potassium 4.0, Sodium 130, Magnesium  2.2   Vital Signs: Weight: 195 lbs (admission weight: 188 lbs) - bed weight Blood pressure: 110-130/70s  Heart rate: 70s  I/O: net -0.4L yesterday; net +0.6L since admission  Medication Assistance / Insurance Benefits Check: Does the patient have prescription insurance?  Yes Type of insurance plan: [employer]  Outpatient Pharmacy:  Prior to admission outpatient pharmacy: [facility] Is the patient willing to use [facility] TOC pharmacy at discharge? Yes Is the patient willing to transition their outpatient pharmacy to utilize a [facility] outpatient pharmacy?   No     Assessment: 1. Acute on chronic systolic CHF (LVEF 25-30%), due to Takotsubo cardiomyopathy. NYHA class II symptoms. - Euvolemic on exam. No diuretics currently. - Continue carvedilol  3.125 mg BID - Continue losartan  12.5 mg daily. Could consider increasing to 25 mg daily if BP remains stable.  - Consider adding spironolactone at follow up - need to be mindful of kidney disease - Deferring SGLT2i while undergoing treatment for UTI and kidney disease as mentioned above - Continue to hold flecainide  as she is now on amiodarone  and with new reduced EF   Plan: 1) Medication changes recommended at this time: - Increase losartan  to 25 mg daily if BP remains stable  2) Patient assistance: - None pending at this time  3)  Education  - Patient has been educated on current HF medications and potential additions to HF medication regimen - Patient verbalizes understanding that over the next few months, these medication doses may change and more medications may be added to optimize HF regimen - Patient has been educated on basic disease state pathophysiology and goals of therapy   Jerilyn Monte, PharmD, BCPS Heart Failure Stewardship Pharmacist Phone [phone]

## 2024-02-18 NOTE — Progress Notes (Addendum)
   02/18/24 1750  Mobility  Activity Transferred from chair to bed  Level of Assistance +2 (takes two people) (MaxA)  Assistive Device Sliding board  RLE Weight Bearing Per Provider Order NWB  Activity Response Tolerated fair  Mobility Referral Yes  Mobility visit 1 Mobility  Mobility Specialist Start Time (ACUTE ONLY) 1535  Mobility Specialist Stop Time (ACUTE ONLY) 1550  Mobility Specialist Time Calculation (min) (ACUTE ONLY) 15 min   Mobility Specialist: Progress Note  Pt agreeable to mobility session -requesting transfer- received in chair. C/o LLE pain throughout. Pt with bowel movement. Returned to bed lying on R side with all needs met - call bell within reach. Husband and NT present.   Isla Mari, BS Mobility Specialist Please contact via SecureChat or  Rehab office at [phone].

## 2024-02-18 NOTE — TOC Progression Note (Addendum)
 Transition of Care [facility]) - Progression Note    Patient Details  Name: [name] [name] MRN: [id number] Date of Birth: 1941/03/12  [facility] CM/SW Contact  Katrinka Parr, [place] Phone Number: 02/18/2024, 11:56 AM  Clinical Narrative:     CSW met with pt and pt's husband bedside. Provided SNF bed offers and medicare star ratings. Initially pt and spouse choose [facility] but after finding out it wouldn't be a private room, pt opts for [facility]. [facility] does have a private room. CSW confirmed choice with pt again. Explained SNF auth process.   CSW left message with [employer] requesting return call to start auth.   1310: received call back from [employer]. Provided details to initiate auth request. [facility] is pending.   Expected Discharge Plan: Skilled Nursing Facility Barriers to Discharge: Insurance Authorization, SNF Pending bed offer                 Social Determinants of Health (SDOH) Interventions SDOH Screenings   Food Insecurity: No Food Insecurity (02/12/2024)  Housing: Low Risk  (02/12/2024)  Transportation Needs: No Transportation Needs (02/16/2024)  Utilities: Not At Risk (02/12/2024)  Alcohol Screen: Low Risk  (02/16/2024)  Depression (PHQ2-9): Low Risk  (01/19/2019)  Financial Resource Strain: Low Risk  (02/16/2024)  Social Connections: Socially Integrated (02/12/2024)  Tobacco Use: Low Risk  (02/15/2024)    Readmission Risk Interventions     No data to display

## 2024-02-18 NOTE — Progress Notes (Signed)
 PROGRESS NOTE    Francess Mullen Cavenaugh  ZOX:[id number] DOB: 26-May-1941 DOA: 02/11/2024 PCP: Gaither Juba, MD    Brief Narrative:   [name] [name] is a 83 y.o. female with past medical history significant for chronic diastolic congestive heart failure, HTN, HLD, paroxysmal atrial fibrillation on Eliquis , CKD stage IIIb, thrombocytosis, history of first degree AVB who presented to [facility] ED on 02/11/2024 with right ankle pain following a fall at home.  Patient reports in her usual state of health until 3-4 days prior to hospital presentation in which she developed left flank pain and rigors.  She reported that her systolic blood pressure was low in the 80s the following day; and continued to feel poor in general with associated nausea and a few episodes of vomiting and loose stools.  She was weak while trying to get into her car at home, sustained fall and suffered right ankle deformity with severe pain.  Denies loss of consciousness.  Denies chest pain, no cough, no shortness of breath, no leg swelling.  In the ED, patient was found to be afebrile, saturating well on nasal cannula with transient tachycardia and hypotension.  Labs notable for creatinine of 3.10, WBC count of 23.7, lactic acid within normal limits, troponin 1228.  Urinalysis consistent with bacteria, pyuria and nitrites.  Imaging notable for acute fracture of the distal right tibia/fibula.  Orthopedics and cardiology were consulted.  TRH consulted for admission for further evaluation and management of right ankle fracture, NSTEMI, urinary tract infection.  Assessment & Plan:   Right ankle fracture Patient presenting with right ankle pain following fall at home.  Right ankle x-ray with acute comminuted fractures of the distal tibial and fibular diaphysis with apex medial angulation.  Orthopedics were consulted and patient underwent ORIF right distal tibia fracture and nonoperative management of right distal fibular  fracture. -- Nonweightbearing RLE with maintaining short leg splint -- Pain control with Tylenol , oxycodone , Robaxin , Dilaudid  as needed. -- Declined by CIR for admission, TOC consulted for SNF placement -- Continue PT/OT efforts while inpatient -- Outpatient follow-up with orthopedics 2 weeks for splint removal, wound check and repeat x-rays  Vitamin D  deficiency Vitamin D  25-hydroxy level 10.67. -- Vitamin D  50,000 units p.o. daily x 8 weeks  NSTEMI secondary to stress cardiomyopathy Patient presenting with elevated troponin of 1228.  No ST elevation on EKG.  Echocardiogram with LVEF 25 to 30%, wall motion abnormalities consistent with possible Takotsubo's cardiomyopathy versus CAD.  Suspect etiology secondary to stress-induced cardiomyopathy per cardiology with recommendation of repeat echocardiogram in 2-3 months as outpatient to reassess LVEF. -- Carvedilol  3.125 mg p.o. twice daily -- Losartan  12.5 g p.o. daily  NSVT Paroxysmal atrial fibrillation On arrival, patient had episodes of NSVT likely related to her underlying cardiomyopathy.  Treated with IV amiodarone  now transition to oral. -- Holding home flecainide  per cardiology -- Continue amiodarone  200 mg PO BID x 7 days and transition to 200 mg p.o. daily on 4/26 -- Carvedilol  3.125 minutes p.o. twice daily -- Eliquis  500s p.o. twice daily -- Outpatient follow-up with cardiology; Dr. Gordan Latina   Urinary tract infection Patient reporting left flank pain, rigors prior to ED presentation.  Urinalysis with bacteria, pyuria and nitrates.  Patient completed 3-day course of IV ceftriaxone  with resolution of symptoms.  Urine culture positive for Enterococcus, however since patient has clinic improved with ceftriaxone  no need for further treatment at this point.  Acute renal failure on CKD stage IIIb Creatinine elevated at 3.10  on admission, likely secondary to prerenal azotemia in the setting of nausea, vomiting, diarrhea, loss of  appetite and severe dehydration.  Support with IV fluids with creatinine back down to within normal limits. -- Cr 3.10>>1.09  Anxiety: Ativan  0.5 mg p.o. every 8 hours as needed anxiety  Hyponatremia Na 130, likely secondary to volume depletion/dehydration.  Continue to encourage increased oral intake  Prolonged Qtc Repeat EKG reviewed, now resolved.   DVT prophylaxis: SCDs Start: 02/15/24 1540 apixaban  (ELIQUIS ) tablet 5 mg    Code Status: Full Code Family Communication: Updated husband present at bedside this morning  Disposition Plan:  Level of care: Med-Surg Status is: Inpatient Remains inpatient appropriate because: Declined by CIR, needs SNF placement, TOC following    Consultants:  Orthopedic, Dr. Curtiss Dowdy Cardiology  Procedures:  ORIF right distal tibia fracture and nonoperative management of right distal fibular fracture, Dr. Curtiss Dowdy on 02/15/2024  Antimicrobials:  Ceftriaxone  4/17-4/19 Vancomycin  4/21-4/21 Linezolid  4/21-4/23 Cefazolin  4/21-4/22   Subjective: Patient seen examined bedside, lying in bed.  Spouse present.  Currently denies pain to foot.  Awaiting SNF bed offers.  Discussed with SW this morning.  No other questions or concerns at this time.  Patient reports anxiety tach overnight, placed on oxygen for supportive measures but no desaturation noted.  Will add Ativan  as needed if needed.  Denies headache, no visual changes, no chest pain, no palpitations, no shortness of breath, no abdominal pain, no fever/chills/night sweats, no nausea/vomiting/diarrhea, no focal weakness, no fatigue, no paresthesias.  No acute events overnight per nurse.  Objective: Vitals:   02/18/24 0500 02/18/24 0529 02/18/24 0802 02/18/24 0829  BP:  136/70 136/70 127/62  Pulse:  75 75 72  Resp:  18  17  Temp:  98.6 F (37 C)  97.9 F (36.6 C)  TempSrc:    Oral  SpO2:  99%  100%  Weight: 88.7 kg     Height:        Intake/Output Summary (Last 24 hours) at 02/18/2024  1032 Last data filed at 02/18/2024 1026 Gross per 24 hour  Intake 203 ml  Output 500 ml  Net -297 ml   Filed Weights   02/15/24 1011 02/17/24 0500 02/18/24 0500  Weight: 81.6 kg 89.2 kg 88.7 kg    Examination:  Physical Exam: GEN: NAD, alert and oriented x 3, wd/wn HEENT: NCAT, PERRL, EOMI, sclera clear, MMM PULM: CTAB w/o wheezes/crackles, normal respiratory effort, on room air CV: RRR w/o M/G/R GI: abd soft, NTND, NABS, no R/G/M MSK: no peripheral edema, moves all extremities independently, noted right lower extremity with splint/ACE warp in place, C/D/I, neurovascular intact NEURO: No focal neurological deficits PSYCH: normal mood/affect Integumentary: Right lower extremity with splint/surgical dressing/Ace wrap in place, clean/dry/intact, otherwise no other concerning rashes/lesions/wounds noted on exposed skin surfaces    Data Reviewed: I have personally reviewed following labs and imaging studies  CBC: Recent Labs  Lab 02/13/24 0231 02/14/24 0412 02/15/24 0152 02/16/24 0639 02/17/24 0646  WBC 13.6* 13.2* 10.1 15.0* 13.2*  HGB 12.6 13.4 12.0 13.5 12.9  HCT 39.1 42.2 37.9 42.4 39.6  MCV 88.5 89.6 89.0 88.7 87.2  PLT 321 340 402* 500* 550*   Basic Metabolic Panel: Recent Labs  Lab 02/11/24 1442 02/11/24 1449 02/12/24 0602 02/13/24 0231 02/14/24 0412 02/15/24 0152 02/16/24 0639 02/17/24 0646  NA 135   < > 135 132* 131* 130* 128* 130*  K 4.3   < > 4.6 4.1 4.7 4.2 4.6 4.0  CL 103   < >  107 104 102 103 98 97*  CO2 20*  --  20* 19* 19* 18* 18* 23  GLUCOSE 97   < > 123* 85 87 85 89 87  BUN 43*   < > 38* 33* 28* 24* 16 13  CREATININE 3.10*   < > 2.24* 1.51* 1.44* 1.21* 1.07* 1.09*  CALCIUM  8.2*  --  8.1* 8.1* 8.2* 8.3* 8.4* 8.3*  MG 1.7  --  2.9* 2.2  --   --   --   --    < > = values in this interval not displayed.   GFR: Estimated Creatinine Clearance: 45.5 mL/min (A) (by C-G formula based on SCr of 1.09 mg/dL (H)). Liver Function Tests: Recent Labs   Lab 02/11/24 1442 02/17/24 0646  AST 30 17  ALT 11 6  ALKPHOS 64 51  BILITOT 0.8 0.7  PROT 5.7* 5.5*  ALBUMIN 2.5* 1.9*   No results for input(s): "LIPASE", "AMYLASE" in the last 168 hours. No results for input(s): "AMMONIA" in the last 168 hours. Coagulation Profile: Recent Labs  Lab 02/11/24 1442  INR 1.8*   Cardiac Enzymes: No results for input(s): "CKTOTAL", "CKMB", "CKMBINDEX", "TROPONINI" in the last 168 hours. BNP (last 3 results) No results for input(s): "PROBNP" in the last 8760 hours. HbA1C: No results for input(s): "HGBA1C" in the last 72 hours. CBG: No results for input(s): "GLUCAP" in the last 168 hours. Lipid Profile: No results for input(s): "CHOL", "HDL", "LDLCALC", "TRIG", "CHOLHDL", "LDLDIRECT" in the last 72 hours. Thyroid  Function Tests: No results for input(s): "TSH", "T4TOTAL", "FREET4", "T3FREE", "THYROIDAB" in the last 72 hours. Anemia Panel: No results for input(s): "VITAMINB12", "FOLATE", "FERRITIN", "TIBC", "IRON", "RETICCTPCT" in the last 72 hours. Sepsis Labs: Recent Labs  Lab 02/11/24 1450  LATICACIDVEN 1.7    Recent Results (from the past 240 hours)  Culture, blood (x 2)     Status: None   Collection Time: 02/11/24  7:20 PM   Specimen: BLOOD LEFT ARM  Result Value Ref Range Status   Specimen Description BLOOD LEFT ARM  Final   Special Requests   Final    BOTTLES DRAWN AEROBIC AND ANAEROBIC Blood Culture results may not be optimal due to an inadequate volume of blood received in culture bottles   Culture   Final    NO GROWTH 5 DAYS Performed at [facility], [address], [place], [place] [address]    Report Status 02/16/2024 FINAL  Final  Culture, blood (x 2)     Status: None   Collection Time: 02/12/24  6:31 AM   Specimen: BLOOD RIGHT HAND  Result Value Ref Range Status   Specimen Description BLOOD RIGHT HAND  Final   Special Requests   Final    AEROBIC BOTTLE ONLY Blood Culture results may not be optimal due to an  inadequate volume of blood received in culture bottles   Culture   Final    NO GROWTH 5 DAYS Performed at [facility], [address] [place], [place] [address]    Report Status 02/17/2024 FINAL  Final  Urine Culture (for pregnant, neutropenic or urologic patients or patients with an indwelling urinary catheter)     Status: Abnormal   Collection Time: 02/12/24  6:05 PM   Specimen: Urine, Clean Catch  Result Value Ref Range Status   Specimen Description URINE, CLEAN CATCH  Final   Special Requests   Final    NONE Performed at [facility], [address], [place], [place] [address]    Culture 20,000 COLONIES/mL ENTEROCOCCUS FAECALIS (A)  Final   Report Status 02/14/2024 FINAL  Final   Organism ID, Bacteria ENTEROCOCCUS FAECALIS (A)  Final      Susceptibility   Enterococcus faecalis - MIC*    AMPICILLIN >=32 RESISTANT Resistant     NITROFURANTOIN  <=16 SENSITIVE Sensitive     VANCOMYCIN  2 SENSITIVE Sensitive     * 20,000 COLONIES/mL ENTEROCOCCUS FAECALIS  Surgical pcr screen     Status: None   Collection Time: 02/14/24 11:42 PM   Specimen: Nasal Mucosa; Nasal Swab  Result Value Ref Range Status   MRSA, PCR NEGATIVE NEGATIVE Final   Staphylococcus aureus NEGATIVE NEGATIVE Final    Comment: (NOTE) The Xpert SA Assay (FDA approved for NASAL specimens in patients 83 years of age and older), is one component of a comprehensive surveillance program. It is not intended to diagnose infection nor to guide or monitor treatment. Performed at [facility], [address], [place], [place] [address]          Radiology Studies: No results found.       Scheduled Meds:  amiodarone   200 mg Oral BID   Followed by   Cecily Cohen ON 02/20/2024] amiodarone   200 mg Oral Daily   apixaban   5 mg Oral BID   carvedilol   3.125 mg Oral BID WC   losartan   12.5 mg Oral Daily   pantoprazole   40 mg Oral Daily   senna-docusate  1 tablet Oral BID   sodium chloride  flush  3 mL  Intravenous Q12H   Continuous Infusions:   LOS: 7 days    Time spent: 48 minutes spent on 02/18/2024 caring for this patient face-to-face including chart review, ordering labs/tests, documenting, discussion with nursing staff, consultants, updating family and interview/physical exam    Rema Care [place], DO [facility] Available via Epic secure chat 7am-7pm After these hours, please refer to coverage provider listed on [email or web address] 02/18/2024, 10:32 AM

## 2024-02-18 NOTE — Progress Notes (Signed)
 Occupational Therapy Treatment Patient Details Name: MENNA ABELN MRN: [id number] DOB: Jan 20, 1941 Today's Date: 02/18/2024   History of present illness Pt is an 83 y/o female presenting on 4/17 with R ankle pain after fall at home. With NSTEMI and per cardiology likely stress induced cardiomyopathy. 4/21 S/P ORIF R distal tibia fx, non op management of R distal fibula fx.  Also found with UTI, AKI. PMH - HTN, ckd, chf, cad, TKA, lumbar fusion, rt hip fx with hemiarthroplasty.   OT comments  Pt progressing well towards goals. Session focused on increasing UB strength and functional transfers. Good progress made with lateral scoot transfers with CGA +2 for safety. Pt requiring frequent cueing for sequencing and rest breaks during scoots d/t fatigue. Once in recliner pt completed x5 chair push ups to increase UB strength needed for functional transfers and LB ADLs. Recommend <3 hours of skilled rehab daily to optimize independence levels. Will continue to follow acutely.       If plan is discharge home, recommend the following:  Two people to help with walking and/or transfers;Two people to help with bathing/dressing/bathroom;Assistance with cooking/housework;Assist for transportation;Help with stairs or ramp for entrance   Equipment Recommendations  Other (comment) (Defer to next venue)    Recommendations for Other Services      Precautions / Restrictions Precautions Precautions: Fall Recall of Precautions/Restrictions: Intact Required Braces or Orthoses: Splint/Cast Splint/Cast: Rt LE short leg splint Restrictions Weight Bearing Restrictions Per Provider Order: Yes RLE Weight Bearing Per Provider Order: Non weight bearing       Mobility Bed Mobility Overal bed mobility: Needs Assistance Bed Mobility: Supine to Sit     Supine to sit: Mod assist, Used rails, +2 for safety/equipment     General bed mobility comments: Cues for sequecning LEs and use of bed rails Mod+2 for  trunk, use of pad to scoot hips to EOB    Transfers Overall transfer level: Needs assistance   Transfers: Bed to chair/wheelchair/BSC            Lateral/Scoot Transfers: Contact guard assist, +2 safety/equipment General transfer comment: Lateral scoot +2 for safety no physical assist provided     Balance Overall balance assessment: History of Falls, Needs assistance Sitting-balance support: Bilateral upper extremity supported, Feet supported Sitting balance-Leahy Scale: Poor Sitting balance - Comments: reliant on UE for balance       ADL either performed or assessed with clinical judgement   ADL Overall ADL's : Needs assistance/impaired     Grooming: Oral care;Set up;Sitting     Toilet Transfer: Contact guard assist Toilet Transfer Details (indicate cue type and reason): lateral scoot to chair         Functional mobility during ADLs: Contact guard assist (Lateral scoot)      Extremity/Trunk Assessment Upper Extremity Assessment Upper Extremity Assessment: Overall WFL for tasks assessed;Right hand dominant   Lower Extremity Assessment Lower Extremity Assessment: Defer to PT evaluation        Vision   Vision Assessment?: No apparent visual deficits         Communication Communication Communication: No apparent difficulties   Cognition Arousal: Alert Behavior During Therapy: WFL for tasks assessed/performed, Anxious Cognition: No apparent impairments       OT - Cognition Comments: appears WFL, not formally assessed.  patients spouse tends to answer for her   Following commands: Intact        Cueing   Cueing Techniques: Verbal cues  Exercises Exercises: General Upper Extremity General Exercises -  Upper Extremity Chair Push Up: Strengthening, Both, 5 reps, Seated       General Comments Husband present for session, eager to assist    Pertinent Vitals/ Pain       Pain Assessment Pain Assessment: Faces Faces Pain Scale: Hurts little  more Pain Location: Rt LE Pain Descriptors / Indicators: Discomfort, Operative site guarding, Grimacing Pain Intervention(s): Monitored during session, Repositioned   Frequency  Min 2X/week        Progress Toward Goals  OT Goals(current goals can now be found in the care plan section)  Progress towards OT goals: Progressing toward goals  Acute Rehab OT Goals Patient Stated Goal: To go to rehab OT Goal Formulation: With patient/family Time For Goal Achievement: 03/01/24 Potential to Achieve Goals: Fair ADL Goals Pt Will Perform Grooming: with set-up;sitting Pt Will Perform Upper Body Dressing: with set-up;sitting Pt Will Perform Lower Body Dressing: sitting/lateral leans;with mod assist;with adaptive equipment Pt Will Transfer to Toilet: with mod assist;stand pivot transfer;squat pivot transfer;bedside commode Additional ADL Goal #1: Pt will complete bed mobility with mod assist as precursor to ADLs.  Plan         AM-PAC OT "6 Clicks" Daily Activity     Outcome Measure   Help from another person eating meals?: None Help from another person taking care of personal grooming?: A Little Help from another person toileting, which includes using toliet, bedpan, or urinal?: Total Help from another person bathing (including washing, rinsing, drying)?: A Lot Help from another person to put on and taking off regular upper body clothing?: A Little Help from another person to put on and taking off regular lower body clothing?: Total 6 Click Score: 14    End of Session    OT Visit Diagnosis: Other abnormalities of gait and mobility (R26.89);Muscle weakness (generalized) (M62.81);Pain;History of falling (Z91.81) Pain - Right/Left: Right Pain - part of body: Leg;Ankle and joints of foot   Activity Tolerance Patient tolerated treatment well   Patient Left in chair;with call bell/phone within reach;with chair alarm set;with family/visitor present   Nurse Communication Mobility  status        Time: 1610-9604 OT Time Calculation (min): 20 min  Charges: OT General Charges $OT Visit: 1 Visit OT Treatments $Self Care/Home Management : 8-22 mins  Delmer Ferraris, OT  Acute Rehabilitation Services Office [phone] Secure chat preferred   Mickael Alamo 02/18/2024, 4:41 PM

## 2024-02-19 DIAGNOSIS — I4729 Other ventricular tachycardia: Secondary | ICD-10-CM | POA: Diagnosis not present

## 2024-02-19 NOTE — Plan of Care (Signed)

## 2024-02-19 NOTE — Plan of Care (Signed)
  Problem: Activity: Goal: Risk for activity intolerance will decrease Outcome: Progressing   Problem: Nutrition: Goal: Adequate nutrition will be maintained Outcome: Progressing   Problem: Elimination: Goal: Will not experience complications related to bowel motility Outcome: Progressing   Problem: Pain Managment: Goal: General experience of comfort will improve and/or be controlled Outcome: Progressing

## 2024-02-19 NOTE — TOC Progression Note (Addendum)
    Transition of Care [facility]) - Progression Note    Patient Details  Name: [name] [name] MRN: [id number] Date of Birth: 03-03-1941  [facility] CM/SW Contact  Katrinka Parr, [place] Phone Number: 02/19/2024, 11:49 AM  Clinical Narrative:     [facility] still pending at this time.  1340: CSW received return call from  [facility] with update on SNF auth. CSW is informed that [facility] is still pending. [facility] rep. Requested TOC contact info for weekend. CSW provided the contact info.   CSW confirmed with [facility] that they can admit tomorrow if [facility] is not approved today.    Expected Discharge Plan: Skilled Nursing Facility Barriers to Discharge: English as a second language teacher,                                              Social Determinants of Health (SDOH) Interventions SDOH Screenings   Food Insecurity: No Food Insecurity (02/12/2024)  Housing: Low Risk  (02/12/2024)  Transportation Needs: No Transportation Needs (02/16/2024)  Utilities: Not At Risk (02/12/2024)  Alcohol Screen: Low Risk  (02/16/2024)  Depression (PHQ2-9): Low Risk  (01/19/2019)  Financial Resource Strain: Low Risk  (02/16/2024)  Social Connections: Socially Integrated (02/12/2024)  Tobacco Use: Low Risk  (02/15/2024)    Readmission Risk Interventions     No data to display

## 2024-02-19 NOTE — Progress Notes (Signed)
 PROGRESS NOTE    Chandi Nicklin Sanford  ZOX:[id number] DOB: [birth year] DOA: 02/11/2024 PCP: Gaither Juba, MD    Brief Narrative:   [name] [name] is a 83 y.o. female with past medical history significant for chronic diastolic congestive heart failure, HTN, HLD, paroxysmal atrial fibrillation on Eliquis , CKD stage IIIb, thrombocytosis, history of first degree AVB who presented to [facility] ED on 02/11/2024 with right ankle pain following a fall at home.  Patient reports in her usual state of health until 3-4 days prior to hospital presentation in which she developed left flank pain and rigors.  She reported that her systolic blood pressure was low in the 80s the following day; and continued to feel poor in general with associated nausea and a few episodes of vomiting and loose stools.  She was weak while trying to get into her car at home, sustained fall and suffered right ankle deformity with severe pain.  Denies loss of consciousness.  Denies chest pain, no cough, no shortness of breath, no leg swelling.  In the ED, patient was found to be afebrile, saturating well on nasal cannula with transient tachycardia and hypotension.  Labs notable for creatinine of 3.10, WBC count of 23.7, lactic acid within normal limits, troponin 1228.  Urinalysis consistent with bacteria, pyuria and nitrites.  Imaging notable for acute fracture of the distal right tibia/fibula.  Orthopedics and cardiology were consulted.  TRH consulted for admission for further evaluation and management of right ankle fracture, NSTEMI, urinary tract infection.  Assessment & Plan:   Right ankle fracture Patient presenting with right ankle pain following fall at home.  Right ankle x-ray with acute comminuted fractures of the distal tibial and fibular diaphysis with apex medial angulation.  Orthopedics were consulted and patient underwent ORIF right distal tibia fracture and nonoperative management of right distal fibular  fracture. -- Nonweightbearing RLE with maintaining short leg splint -- Pain control with Tylenol , oxycodone , Robaxin , Dilaudid  as needed. -- Declined by CIR for admission, pending insurance authorization for SNF placement -- Continue PT/OT efforts while inpatient -- Outpatient follow-up with orthopedics 2 weeks for splint removal, wound check and repeat x-rays  Vitamin D  deficiency Vitamin D  25-hydroxy level 10.67. -- Vitamin D  50,000 units p.o. daily x 8 weeks  NSTEMI secondary to stress cardiomyopathy Patient presenting with elevated troponin of 1228.  No ST elevation on EKG.  Echocardiogram with LVEF 25 to 30%, wall motion abnormalities consistent with possible Takotsubo's cardiomyopathy versus CAD.  Suspect etiology secondary to stress-induced cardiomyopathy per cardiology with recommendation of repeat echocardiogram in 2-3 months as outpatient to reassess LVEF. -- Carvedilol  3.125 mg p.o. twice daily -- Losartan  12.5 g p.o. daily  NSVT Paroxysmal atrial fibrillation On arrival, patient had episodes of NSVT likely related to her underlying cardiomyopathy.  Treated with IV amiodarone  now transition to oral. -- Holding home flecainide  per cardiology -- Continue amiodarone  200 mg PO BID x 7 days and transition to 200 mg p.o. daily on 4/26 -- Carvedilol  3.125 minutes p.o. twice daily -- Eliquis  500s p.o. twice daily -- Outpatient follow-up with cardiology; Dr. Gordan Latina   Urinary tract infection Patient reporting left flank pain, rigors prior to ED presentation.  Urinalysis with bacteria, pyuria and nitrates.  Patient completed 3-day course of IV ceftriaxone  with resolution of symptoms.  Urine culture positive for Enterococcus, however since patient has clinic improved with ceftriaxone  no need for further treatment at this point.  Acute renal failure on CKD stage IIIb Creatinine elevated at  3.10 on admission, likely secondary to prerenal azotemia in the setting of nausea, vomiting,  diarrhea, loss of appetite and severe dehydration.  Support with IV fluids with creatinine back down to within normal limits. -- Cr 3.10>>1.09  Anxiety: Ativan  0.5 mg p.o. every 8 hours as needed anxiety  Hyponatremia Na 130, likely secondary to volume depletion/dehydration.  Continue to encourage increased oral intake  Prolonged Qtc Repeat EKG reviewed, now resolved.   DVT prophylaxis: SCDs Start: 02/15/24 1540 apixaban  (ELIQUIS ) tablet 5 mg    Code Status: Full Code Family Communication: Updated family present at bedside this morning  Disposition Plan:  Level of care: Med-Surg Status is: Inpatient Remains inpatient appropriate because: Declined by CIR, pending insurance authorization for SNF placement    Consultants:  Orthopedic, Dr. Curtiss Dowdy Cardiology  Procedures:  ORIF right distal tibia fracture and nonoperative management of right distal fibular fracture, Dr. Curtiss Dowdy on 02/15/2024  Antimicrobials:  Ceftriaxone  4/17-4/19 Vancomycin  4/21-4/21 Linezolid  4/21-4/23 Cefazolin  4/21-4/22   Subjective: Patient seen examined bedside, lying in bed.  Spouse present.  Currently denies pain to foot.  Awaiting SNF bed offers.  Discussed with SW this morning.  No other questions or concerns at this time.  Patient reports anxiety tach overnight, placed on oxygen for supportive measures but no desaturation noted.  Will add Ativan  as needed if needed.  Denies headache, no visual changes, no chest pain, no palpitations, no shortness of breath, no abdominal pain, no fever/chills/night sweats, no nausea/vomiting/diarrhea, no focal weakness, no fatigue, no paresthesias.  No acute events overnight per nurse.  Objective: Vitals:   02/18/24 2012 02/19/24 0300 02/19/24 0500 02/19/24 0734  BP: 116/78 130/70  123/63  Pulse: 73 73  71  Resp:  18  16  Temp: 98.4 F (36.9 C) 97.8 F (36.6 C)  97.7 F (36.5 C)  TempSrc: Oral Oral  Oral  SpO2: 95% 96%  99%  Weight:   88.6 kg   Height:        No intake or output data in the 24 hours ending 02/19/24 1112  Filed Weights   02/17/24 0500 02/18/24 0500 02/19/24 0500  Weight: 89.2 kg 88.7 kg 88.6 kg    Examination:  Physical Exam: GEN: NAD, alert and oriented x 3, wd/wn HEENT: NCAT, PERRL, EOMI, sclera clear, MMM PULM: CTAB w/o wheezes/crackles, normal respiratory effort, on room air CV: RRR w/o M/G/R GI: abd soft, NTND, NABS, no R/G/M MSK: no peripheral edema, moves all extremities independently, noted right lower extremity with splint/ACE warp in place, C/D/I, neurovascular intact NEURO: No focal neurological deficits PSYCH: normal mood/affect Integumentary: Right lower extremity with splint/surgical dressing/Ace wrap in place, clean/dry/intact, otherwise no other concerning rashes/lesions/wounds noted on exposed skin surfaces    Data Reviewed: I have personally reviewed following labs and imaging studies  CBC: Recent Labs  Lab 02/13/24 0231 02/14/24 0412 02/15/24 0152 02/16/24 0639 02/17/24 0646  WBC 13.6* 13.2* 10.1 15.0* 13.2*  HGB 12.6 13.4 12.0 13.5 12.9  HCT 39.1 42.2 37.9 42.4 39.6  MCV 88.5 89.6 89.0 88.7 87.2  PLT 321 340 402* 500* 550*   Basic Metabolic Panel: Recent Labs  Lab 02/13/24 0231 02/14/24 0412 02/15/24 0152 02/16/24 0639 02/17/24 0646  NA 132* 131* 130* 128* 130*  K 4.1 4.7 4.2 4.6 4.0  CL 104 102 103 98 97*  CO2 19* 19* 18* 18* 23  GLUCOSE 85 87 85 89 87  BUN 33* 28* 24* 16 13  CREATININE 1.51* 1.44* 1.21* 1.07* 1.09*  CALCIUM  8.1*  8.2* 8.3* 8.4* 8.3*  MG 2.2  --   --   --   --    GFR: Estimated Creatinine Clearance: 45.5 mL/min (A) (by C-G formula based on SCr of 1.09 mg/dL (H)). Liver Function Tests: Recent Labs  Lab 02/17/24 0646  AST 17  ALT 6  ALKPHOS 51  BILITOT 0.7  PROT 5.5*  ALBUMIN 1.9*   No results for input(s): "LIPASE", "AMYLASE" in the last 168 hours. No results for input(s): "AMMONIA" in the last 168 hours. Coagulation Profile: No results for  input(s): "INR", "PROTIME" in the last 168 hours.  Cardiac Enzymes: No results for input(s): "CKTOTAL", "CKMB", "CKMBINDEX", "TROPONINI" in the last 168 hours. BNP (last 3 results) No results for input(s): "PROBNP" in the last 8760 hours. HbA1C: No results for input(s): "HGBA1C" in the last 72 hours. CBG: No results for input(s): "GLUCAP" in the last 168 hours. Lipid Profile: No results for input(s): "CHOL", "HDL", "LDLCALC", "TRIG", "CHOLHDL", "LDLDIRECT" in the last 72 hours. Thyroid  Function Tests: No results for input(s): "TSH", "T4TOTAL", "FREET4", "T3FREE", "THYROIDAB" in the last 72 hours. Anemia Panel: No results for input(s): "VITAMINB12", "FOLATE", "FERRITIN", "TIBC", "IRON", "RETICCTPCT" in the last 72 hours. Sepsis Labs: No results for input(s): "PROCALCITON", "LATICACIDVEN" in the last 168 hours.   Recent Results (from the past 240 hours)  Culture, blood (x 2)     Status: None   Collection Time: 02/11/24  7:20 PM   Specimen: BLOOD LEFT ARM  Result Value Ref Range Status   Specimen Description BLOOD LEFT ARM  Final   Special Requests   Final    BOTTLES DRAWN AEROBIC AND ANAEROBIC Blood Culture results may not be optimal due to an inadequate volume of blood received in culture bottles   Culture   Final    NO GROWTH 5 DAYS Performed at [facility], [address] [place], [place] [address]    Report Status 02/16/2024 FINAL  Final  Culture, blood (x 2)     Status: None   Collection Time: 02/12/24  6:31 AM   Specimen: BLOOD RIGHT HAND  Result Value Ref Range Status   Specimen Description BLOOD RIGHT HAND  Final   Special Requests   Final    AEROBIC BOTTLE ONLY Blood Culture results may not be optimal due to an inadequate volume of blood received in culture bottles   Culture   Final    NO GROWTH 5 DAYS Performed at [facility], [address] [place], [place] [address]    Report Status 02/17/2024 FINAL  Final  Urine Culture (for pregnant,  neutropenic or urologic patients or patients with an indwelling urinary catheter)     Status: Abnormal   Collection Time: 02/12/24  6:05 PM   Specimen: Urine, Clean Catch  Result Value Ref Range Status   Specimen Description URINE, CLEAN CATCH  Final   Special Requests   Final    NONE Performed at [facility], [address] [place], [place] [address]    Culture 20,000 COLONIES/mL ENTEROCOCCUS FAECALIS (A)  Final   Report Status 02/14/2024 FINAL  Final   Organism ID, Bacteria ENTEROCOCCUS FAECALIS (A)  Final      Susceptibility   Enterococcus faecalis - MIC*    AMPICILLIN >=32 RESISTANT Resistant     NITROFURANTOIN  <=16 SENSITIVE Sensitive     VANCOMYCIN  2 SENSITIVE Sensitive     * 20,000 COLONIES/mL ENTEROCOCCUS FAECALIS  Surgical pcr screen     Status: None  Collection Time: 02/14/24 11:42 PM   Specimen: Nasal Mucosa; Nasal Swab  Result Value Ref Range Status   MRSA, PCR NEGATIVE NEGATIVE Final   Staphylococcus aureus NEGATIVE NEGATIVE Final    Comment: (NOTE) The Xpert SA Assay (FDA approved for NASAL specimens in patients 45 years of age and older), is one component of a comprehensive surveillance program. It is not intended to diagnose infection nor to guide or monitor treatment. Performed at [facility], [address], [place], [place] [address]          Radiology Studies: No results found.       Scheduled Meds:  amiodarone   200 mg Oral BID   Followed by   Cecily Cohen ON 02/20/2024] amiodarone   200 mg Oral Daily   apixaban   5 mg Oral BID   carvedilol   3.125 mg Oral BID WC   losartan   12.5 mg Oral Daily   pantoprazole   40 mg Oral Daily   senna-docusate  1 tablet Oral BID   sodium chloride  flush  3 mL Intravenous Q12H   Continuous Infusions:   LOS: 8 days    Time spent: 48 minutes spent on 02/19/2024 caring for this patient face-to-face including chart review, ordering labs/tests, documenting, discussion with nursing staff, consultants,  updating family and interview/physical exam    Rema [facility] [place], DO [facility] Available via Epic secure chat 7am-7pm After these hours, please refer to coverage provider listed on [email or web address] 02/19/2024, 11:12 AM

## 2024-02-19 NOTE — Progress Notes (Addendum)
 Physical Therapy Treatment Patient Details Name: [name] [name] MRN: [id number] DOB: 08/19/1941 Today's Date: 02/19/2024   History of Present Illness Pt is an 83 y/o female presenting on 4/17 with R ankle pain after fall at home. With NSTEMI and per cardiology likely stress induced cardiomyopathy. 4/21 S/P ORIF R distal tibia fx, non op management of R distal fibula fx.  Also found with UTI, AKI. PMH - HTN, ckd, chf, cad, TKA, lumbar fusion, rt hip fx with hemiarthroplasty.    PT Comments  Pt in bed upon arrival to room, agreeable to PT session focused on transfer training and LE exercises. Pt tolerated slide board transfer OOB to recliner well, pt with good maintenance of WB precautions. Pt overall requiring light assist for transfers at this time. Pt tolerated LE strengthening exercises well. Plan remains appropriate, PT to continue to follow.      If plan is discharge home, recommend the following: Assistance with cooking/housework;Assist for transportation;Help with stairs or ramp for entrance;A little help with walking and/or transfers;A lot of help with walking and/or transfers;A lot of help with bathing/dressing/bathroom   Can travel by private vehicle        Equipment Recommendations  Wheelchair (measurements PT);Wheelchair cushion (measurements PT);Other (comment)    Recommendations for Other Services       Precautions / Restrictions Precautions Precautions: Fall Recall of Precautions/Restrictions: Intact Required Braces or Orthoses: Splint/Cast Splint/Cast: Rt LE short leg splint Restrictions Weight Bearing Restrictions Per Provider Order: Yes RLE Weight Bearing Per Provider Order: Non weight bearing     Mobility  Bed Mobility Overal bed mobility: Needs Assistance Bed Mobility: Supine to Sit     Supine to sit: Used rails, Min assist     General bed mobility comments: assist for completion of trunk elevation, LE progression to EOB.    Transfers Overall  transfer level: Needs assistance Equipment used: Sliding board Transfers: Bed to chair/wheelchair/BSC            Lateral/Scoot Transfers: +2 safety/equipment, Min assist General transfer comment: assist for board placement, scooting across board with bed pad, steadying, and correct placement of LLE for balance    Ambulation/Gait               General Gait Details: nt   Stairs             Wheelchair Mobility     Tilt Bed    Modified Rankin (Stroke Patients Only)       Balance Overall balance assessment: History of Falls, Needs assistance Sitting-balance support: Bilateral upper extremity supported, Feet supported Sitting balance-Leahy Scale: Fair Sitting balance - Comments: able to sit EOB without PT support, using UEs to support self                                    Communication Communication Communication: No apparent difficulties  Cognition Arousal: Alert Behavior During Therapy: WFL for tasks assessed/performed   PT - Cognitive impairments: No apparent impairments                         Following commands: Intact      Cueing Cueing Techniques: Verbal cues  Exercises General Exercises - Lower Extremity Long Arc Quad: AROM, Both, Seated, 10 reps Hip Flexion/Marching: AROM, Both, 10 reps, Seated    General Comments        Pertinent Vitals/Pain Pain Assessment Pain  Assessment: Faces Faces Pain Scale: Hurts even more Pain Location: RLE Pain Descriptors / Indicators: Discomfort, Operative site guarding, Grimacing Pain Intervention(s): Limited activity within patient's tolerance, Monitored during session, Repositioned    Home Living                          Prior Function            PT Goals (current goals can now be found in the care plan section) Acute Rehab PT Goals Patient Stated Goal: get to rehab and home PT Goal Formulation: With patient/family Time For Goal Achievement:  03/01/24 Potential to Achieve Goals: Good Progress towards PT goals: Progressing toward goals    Frequency    Min 2X/week      PT Plan      Co-evaluation              AM-PAC PT "6 Clicks" Mobility   Outcome Measure  Help needed turning from your back to your side while in a flat bed without using bedrails?: A Lot Help needed moving from lying on your back to sitting on the side of a flat bed without using bedrails?: A Lot Help needed moving to and from a bed to a chair (including a wheelchair)?: A Lot Help needed standing up from a chair using your arms (e.g., wheelchair or bedside chair)?: A Lot Help needed to walk in hospital room?: Total Help needed climbing 3-5 steps with a railing? : Total 6 Click Score: 10    End of Session   Activity Tolerance: Patient tolerated treatment well Patient left: in chair;with call bell/phone within reach;with chair alarm set;with family/visitor present Nurse Communication: Mobility status PT Visit Diagnosis: Other abnormalities of gait and mobility (R26.89);Muscle weakness (generalized) (M62.81)     Time: 3474-2595 PT Time Calculation (min) (ACUTE ONLY): 20 min  Charges:    $Therapeutic Activity: 8-22 mins PT General Charges $$ ACUTE PT VISIT: 1 Visit                     Shirlene Doughty, PT DPT Acute Rehabilitation Services Secure Chat Preferred  Office [phone]    Ethridge Herder 02/19/2024, 4:23 PM

## 2024-02-20 DIAGNOSIS — Y838 Other surgical procedures as the cause of abnormal reaction of the patient, or of later complication, without mention of misadventure at the time of the procedure: Secondary | ICD-10-CM | POA: Diagnosis present

## 2024-02-20 DIAGNOSIS — M810 Age-related osteoporosis without current pathological fracture: Secondary | ICD-10-CM | POA: Diagnosis not present

## 2024-02-20 DIAGNOSIS — G8918 Other acute postprocedural pain: Secondary | ICD-10-CM | POA: Diagnosis not present

## 2024-02-20 DIAGNOSIS — S82831D Other fracture of upper and lower end of right fibula, subsequent encounter for closed fracture with routine healing: Secondary | ICD-10-CM | POA: Diagnosis not present

## 2024-02-20 DIAGNOSIS — I130 Hypertensive heart and chronic kidney disease with heart failure and stage 1 through stage 4 chronic kidney disease, or unspecified chronic kidney disease: Secondary | ICD-10-CM | POA: Diagnosis not present

## 2024-02-20 DIAGNOSIS — R002 Palpitations: Secondary | ICD-10-CM | POA: Diagnosis not present

## 2024-02-20 DIAGNOSIS — D72829 Elevated white blood cell count, unspecified: Secondary | ICD-10-CM | POA: Diagnosis not present

## 2024-02-20 DIAGNOSIS — R197 Diarrhea, unspecified: Secondary | ICD-10-CM | POA: Diagnosis not present

## 2024-02-20 DIAGNOSIS — D6859 Other primary thrombophilia: Secondary | ICD-10-CM | POA: Diagnosis not present

## 2024-02-20 DIAGNOSIS — E785 Hyperlipidemia, unspecified: Secondary | ICD-10-CM | POA: Diagnosis present

## 2024-02-20 DIAGNOSIS — I4729 Other ventricular tachycardia: Secondary | ICD-10-CM | POA: Diagnosis not present

## 2024-02-20 DIAGNOSIS — S82301D Unspecified fracture of lower end of right tibia, subsequent encounter for closed fracture with routine healing: Secondary | ICD-10-CM | POA: Diagnosis not present

## 2024-02-20 DIAGNOSIS — I480 Paroxysmal atrial fibrillation: Secondary | ICD-10-CM | POA: Diagnosis present

## 2024-02-20 DIAGNOSIS — F419 Anxiety disorder, unspecified: Secondary | ICD-10-CM | POA: Diagnosis not present

## 2024-02-20 DIAGNOSIS — T8131XA Disruption of external operation (surgical) wound, not elsewhere classified, initial encounter: Secondary | ICD-10-CM | POA: Diagnosis present

## 2024-02-20 DIAGNOSIS — I10 Essential (primary) hypertension: Secondary | ICD-10-CM | POA: Diagnosis not present

## 2024-02-20 DIAGNOSIS — I2510 Atherosclerotic heart disease of native coronary artery without angina pectoris: Secondary | ICD-10-CM | POA: Diagnosis present

## 2024-02-20 DIAGNOSIS — Z96653 Presence of artificial knee joint, bilateral: Secondary | ICD-10-CM | POA: Diagnosis present

## 2024-02-20 DIAGNOSIS — N1831 Chronic kidney disease, stage 3a: Secondary | ICD-10-CM | POA: Diagnosis not present

## 2024-02-20 DIAGNOSIS — I5022 Chronic systolic (congestive) heart failure: Secondary | ICD-10-CM | POA: Diagnosis not present

## 2024-02-20 DIAGNOSIS — I5020 Unspecified systolic (congestive) heart failure: Secondary | ICD-10-CM | POA: Diagnosis not present

## 2024-02-20 DIAGNOSIS — I25118 Atherosclerotic heart disease of native coronary artery with other forms of angina pectoris: Secondary | ICD-10-CM | POA: Diagnosis not present

## 2024-02-20 DIAGNOSIS — E559 Vitamin D deficiency, unspecified: Secondary | ICD-10-CM | POA: Diagnosis not present

## 2024-02-20 DIAGNOSIS — L0390 Cellulitis, unspecified: Secondary | ICD-10-CM | POA: Diagnosis present

## 2024-02-20 DIAGNOSIS — W19XXXD Unspecified fall, subsequent encounter: Secondary | ICD-10-CM | POA: Diagnosis present

## 2024-02-20 DIAGNOSIS — M86171 Other acute osteomyelitis, right ankle and foot: Secondary | ICD-10-CM | POA: Diagnosis not present

## 2024-02-20 DIAGNOSIS — Z8249 Family history of ischemic heart disease and other diseases of the circulatory system: Secondary | ICD-10-CM | POA: Diagnosis not present

## 2024-02-20 DIAGNOSIS — Z472 Encounter for removal of internal fixation device: Secondary | ICD-10-CM | POA: Diagnosis not present

## 2024-02-20 DIAGNOSIS — R262 Difficulty in walking, not elsewhere classified: Secondary | ICD-10-CM | POA: Diagnosis not present

## 2024-02-20 DIAGNOSIS — D62 Acute posthemorrhagic anemia: Secondary | ICD-10-CM | POA: Diagnosis not present

## 2024-02-20 DIAGNOSIS — M869 Osteomyelitis, unspecified: Secondary | ICD-10-CM | POA: Diagnosis present

## 2024-02-20 DIAGNOSIS — S82201A Unspecified fracture of shaft of right tibia, initial encounter for closed fracture: Secondary | ICD-10-CM | POA: Diagnosis not present

## 2024-02-20 DIAGNOSIS — I4891 Unspecified atrial fibrillation: Secondary | ICD-10-CM | POA: Diagnosis not present

## 2024-02-20 DIAGNOSIS — R2689 Other abnormalities of gait and mobility: Secondary | ICD-10-CM | POA: Diagnosis not present

## 2024-02-20 DIAGNOSIS — N390 Urinary tract infection, site not specified: Secondary | ICD-10-CM | POA: Diagnosis not present

## 2024-02-20 DIAGNOSIS — E669 Obesity, unspecified: Secondary | ICD-10-CM | POA: Diagnosis present

## 2024-02-20 DIAGNOSIS — N179 Acute kidney failure, unspecified: Secondary | ICD-10-CM | POA: Diagnosis present

## 2024-02-20 DIAGNOSIS — Z8551 Personal history of malignant neoplasm of bladder: Secondary | ICD-10-CM | POA: Diagnosis not present

## 2024-02-20 DIAGNOSIS — T8469XA Infection and inflammatory reaction due to internal fixation device of other site, initial encounter: Secondary | ICD-10-CM | POA: Diagnosis not present

## 2024-02-20 DIAGNOSIS — Z79899 Other long term (current) drug therapy: Secondary | ICD-10-CM | POA: Diagnosis not present

## 2024-02-20 DIAGNOSIS — Z905 Acquired absence of kidney: Secondary | ICD-10-CM | POA: Diagnosis not present

## 2024-02-20 DIAGNOSIS — I5181 Takotsubo syndrome: Secondary | ICD-10-CM | POA: Diagnosis not present

## 2024-02-20 DIAGNOSIS — R7989 Other specified abnormal findings of blood chemistry: Secondary | ICD-10-CM | POA: Diagnosis not present

## 2024-02-20 DIAGNOSIS — R2681 Unsteadiness on feet: Secondary | ICD-10-CM | POA: Diagnosis not present

## 2024-02-20 DIAGNOSIS — M84371D Stress fracture, right ankle, subsequent encounter for fracture with routine healing: Secondary | ICD-10-CM | POA: Diagnosis not present

## 2024-02-20 DIAGNOSIS — Z1621 Resistance to vancomycin: Secondary | ICD-10-CM | POA: Diagnosis present

## 2024-02-20 DIAGNOSIS — B965 Pseudomonas (aeruginosa) (mallei) (pseudomallei) as the cause of diseases classified elsewhere: Secondary | ICD-10-CM | POA: Diagnosis not present

## 2024-02-20 DIAGNOSIS — F32A Depression, unspecified: Secondary | ICD-10-CM | POA: Diagnosis not present

## 2024-02-20 DIAGNOSIS — I5032 Chronic diastolic (congestive) heart failure: Secondary | ICD-10-CM | POA: Diagnosis present

## 2024-02-20 DIAGNOSIS — Z7901 Long term (current) use of anticoagulants: Secondary | ICD-10-CM | POA: Diagnosis not present

## 2024-02-20 DIAGNOSIS — D75839 Thrombocytosis, unspecified: Secondary | ICD-10-CM | POA: Diagnosis not present

## 2024-02-20 DIAGNOSIS — N183 Chronic kidney disease, stage 3 unspecified: Secondary | ICD-10-CM | POA: Diagnosis not present

## 2024-02-20 DIAGNOSIS — N1832 Chronic kidney disease, stage 3b: Secondary | ICD-10-CM | POA: Diagnosis present

## 2024-02-20 DIAGNOSIS — T84498A Other mechanical complication of other internal orthopedic devices, implants and grafts, initial encounter: Secondary | ICD-10-CM | POA: Diagnosis present

## 2024-02-20 DIAGNOSIS — I493 Ventricular premature depolarization: Secondary | ICD-10-CM | POA: Diagnosis not present

## 2024-02-20 DIAGNOSIS — I214 Non-ST elevation (NSTEMI) myocardial infarction: Secondary | ICD-10-CM | POA: Diagnosis not present

## 2024-02-20 DIAGNOSIS — B952 Enterococcus as the cause of diseases classified elsewhere: Secondary | ICD-10-CM | POA: Diagnosis present

## 2024-02-20 DIAGNOSIS — T84622A Infection and inflammatory reaction due to internal fixation device of right tibia, initial encounter: Secondary | ICD-10-CM | POA: Diagnosis present

## 2024-02-20 DIAGNOSIS — K219 Gastro-esophageal reflux disease without esophagitis: Secondary | ICD-10-CM | POA: Diagnosis present

## 2024-02-20 MED ORDER — HYDROXYUREA 500 MG PO CAPS: 500.0000 mg | ORAL_CAPSULE | ORAL | Status: DC

## 2024-02-20 MED ORDER — POLYETHYLENE GLYCOL 3350 17 G PO PACK: 17.0000 g | PACK | Freq: Every day | ORAL | Status: AC | PRN

## 2024-02-20 MED ORDER — SENNOSIDES-DOCUSATE SODIUM 8.6-50 MG PO TABS: 1.0000 | ORAL_TABLET | Freq: Two times a day (BID) | ORAL | Status: AC

## 2024-02-20 MED ORDER — AMIODARONE HCL 200 MG PO TABS: 200.0000 mg | ORAL_TABLET | Freq: Every day | ORAL | Status: DC

## 2024-02-20 MED ORDER — CHOLECALCIFEROL 1.25 MG (50000 UT) PO CAPS: 50000.0000 [IU] | ORAL_CAPSULE | ORAL | Status: AC

## 2024-02-20 MED ORDER — CARVEDILOL 3.125 MG PO TABS: 3.1250 mg | ORAL_TABLET | Freq: Two times a day (BID) | ORAL | Status: DC

## 2024-02-20 MED ORDER — LOSARTAN POTASSIUM 25 MG PO TABS: 12.5000 mg | ORAL_TABLET | Freq: Every day | ORAL | Status: DC

## 2024-02-20 NOTE — Plan of Care (Signed)

## 2024-02-20 NOTE — Progress Notes (Signed)
Report given to Lorie, RN.

## 2024-02-20 NOTE — TOC Transition Note (Addendum)
 Transition of Care [facility]) - Discharge Note   Patient Details  Name: [name] [name] MRN: [id number] Date of Birth: [birth year]  Transition of Care [facility]) CM/SW Contact:  Adline Hook, LCSW Phone Number: 02/20/2024, 11:59 AM   Clinical Narrative:     Per MD patient ready for DC to [facility]. RN, patient, patient's family, and facility notified of DC. Discharge Summary and FL2 sent to facility. DC packet on chart. Insurance [employer] has been received for [facility] [phone]) and [facility] [phone]). Ambulance transport requested for patient.    RN to call report to [phone] ext [phone]. She will go to room 704.   CSW will sign off for now as social work intervention is no longer needed. Please consult us  again if new needs arise.   Final next level of care: Skilled Nursing Facility Barriers to Discharge: Barriers Resolved   Patient Goals and CMS Choice            Discharge Placement              Patient chooses bed at: [facility], [place] Patient to be transferred to facility by: [facility] Name of family member notified: pt declined Patient and family notified of of transfer: 02/20/24  Discharge Plan and Services Additional resources added to the After Visit Summary for                                       Social Drivers of Health (SDOH) Interventions SDOH Screenings   Food Insecurity: No Food Insecurity (02/12/2024)  Housing: Low Risk  (02/12/2024)  Transportation Needs: No Transportation Needs (02/16/2024)  Utilities: Not At Risk (02/12/2024)  Alcohol Screen: Low Risk  (02/16/2024)  Depression (PHQ2-9): Low Risk  (01/19/2019)  Financial Resource Strain: Low Risk  (02/16/2024)  Social Connections: Socially Integrated (02/12/2024)  Tobacco Use: Low Risk  (02/15/2024)     Readmission Risk Interventions     No data to display

## 2024-02-20 NOTE — Discharge Summary (Signed)
 Physician Discharge Summary  [name] [name] ZOX:[id number] DOB: 1941-04-24 DOA: 02/11/2024  PCP: Gaither Juba, MD  Admit date: 02/11/2024 Discharge date: 02/20/2024  Admitted From: Home Disposition:  [facility]  Recommendations for Outpatient Follow-up:  Follow up with PCP in 1-2 weeks Follow-up with orthopedics, Dr. Curtiss Dowdy 2 weeks Outpatient follow-up with cardiology; 02/23/2024 at 11am Continue nonweightbearing right lower extremity with splint in place until orthopedic follow-up Flecainide  discontinued per cardiology, outpatient follow-up Started on amiodarone   Discharge Condition: Stable CODE STATUS: Full code Diet recommendation: Heart healthy diet  History of present illness:  [name] [name] is a 83 y.o. female with past medical history significant for chronic diastolic congestive heart failure, HTN, HLD, paroxysmal atrial fibrillation on Eliquis , CKD stage IIIb, thrombocytosis, history of first degree AVB who presented to [facility] ED on 02/11/2024 with right ankle pain following a fall at home.  Patient reports in her usual state of health until 3-4 days prior to hospital presentation in which she developed left flank pain and rigors.  She reported that her systolic blood pressure was low in the 80s the following day; and continued to feel poor in general with associated nausea and a few episodes of vomiting and loose stools.  She was weak while trying to get into her car at home, sustained fall and suffered right ankle deformity with severe pain.  Denies loss of consciousness.  Denies chest pain, no cough, no shortness of breath, no leg swelling.   In the ED, patient was found to be afebrile, saturating well on nasal cannula with transient tachycardia and hypotension.  Labs notable for creatinine of 3.10, WBC count of 23.7, lactic acid within normal limits, troponin 1228.  Urinalysis consistent with bacteria, pyuria and nitrites.  Imaging notable for acute  fracture of the distal right tibia/fibula.  Orthopedics and cardiology were consulted.  TRH consulted for admission for further evaluation and management of right ankle fracture, NSTEMI, urinary tract infection.  Hospital course:  Right ankle fracture Patient presenting with right ankle pain following fall at home.  Right ankle x-ray with acute comminuted fractures of the distal tibial and fibular diaphysis with apex medial angulation.  Orthopedics were consulted and patient underwent ORIF right distal tibia fracture and nonoperative management of right distal fibular fracture. Nonweightbearing RLE with maintaining short leg splint. Outpatient follow-up with orthopedics 2 weeks for splint removal, wound check and repeat x-rays   Vitamin D  deficiency Vitamin D  25-hydroxy level 10.67. Vitamin D  50,000 units p.o. weekly x 8 weeks   NSTEMI secondary to stress cardiomyopathy Patient presenting with elevated troponin of 1228.  No ST elevation on EKG.  Echocardiogram with LVEF 25 to 30%, wall motion abnormalities consistent with possible Takotsubo's cardiomyopathy versus CAD.  Suspect etiology secondary to stress-induced cardiomyopathy per cardiology with recommendation of repeat echocardiogram in 2-3 months as outpatient to reassess LVEF. -- Carvedilol  3.125 mg p.o. twice daily -- Losartan  12.5 g p.o. daily   NSVT Paroxysmal atrial fibrillation On arrival, patient had episodes of NSVT likely related to her underlying cardiomyopathy.  Treated with IV amiodarone  now transition amiodarone  to 100 mg p.o. daily. Holding home flecainide  per cardiology. Carvedilol  3.125 mg p.o. twice daily. Eliquis  5 mg p.o. twice daily. Outpatient follow-up with cardiology; Dr. Krasowski.    Urinary tract infection Patient reporting left flank pain, rigors prior to ED presentation.  Urinalysis with bacteria, pyuria and nitrates.  Patient completed 3-day course of IV ceftriaxone  with resolution of symptoms.  Urine culture  positive for Enterococcus, however since patient has clinic improved with ceftriaxone  no need for further treatment at this point.   Acute renal failure on CKD stage IIIb Creatinine elevated at 3.10 on admission, likely secondary to prerenal azotemia in the setting of nausea, vomiting, diarrhea, loss of appetite and severe dehydration.  Support with IV fluids with creatinine back down to within normal limits.    Hyponatremia Na 130, likely secondary to volume depletion/dehydration.  Continue to encourage increased oral intake   Prolonged Qtc Repeat EKG reviewed, now resolved.  Discharge Diagnoses:  Principal Problem:   NSVT (nonsustained ventricular tachycardia) (HCC) Active Problems:   Acute renal failure superimposed on stage 3b chronic kidney disease (HCC)   Vomiting and diarrhea   Paroxysmal atrial fibrillation (HCC)   NSTEMI, initial episode of care [facility])   Closed fracture of distal end of right fibula and tibia   Sepsis secondary to UTI [facility])    Discharge Instructions  Discharge Instructions     Call MD for:  difficulty breathing, headache or visual disturbances   Complete by: As directed    Call MD for:  extreme fatigue   Complete by: As directed    Call MD for:  persistant dizziness or light-headedness   Complete by: As directed    Call MD for:  persistant nausea and vomiting   Complete by: As directed    Call MD for:  severe uncontrolled pain   Complete by: As directed    Call MD for:  temperature >100.4   Complete by: As directed    Diet - low sodium heart healthy   Complete by: As directed    Increase activity slowly   Complete by: As directed    No wound care   Complete by: As directed       Allergies as of 02/20/2024       Reactions   Codeine Nausea Only, Other (See Comments)   hallucinations   Hydrocodone  Nausea Only, Other (See Comments)        Medication List     STOP taking these medications    flecainide  100 MG tablet Commonly known  as: TAMBOCOR        TAKE these medications    acetaminophen  500 MG tablet Commonly known as: TYLENOL  Take 1,000 mg by mouth every 6 (six) hours as needed for moderate pain or headache.   albuterol  108 (90 Base) MCG/ACT inhaler Commonly known as: VENTOLIN  HFA Inhale 2 puffs into the lungs every 4 (four) hours as needed for wheezing or shortness of breath.   amiodarone  200 MG tablet Commonly known as: PACERONE  Take 1 tablet (200 mg total) by mouth daily.   amitriptyline  25 MG tablet Commonly known as: ELAVIL  Take 25 mg by mouth at bedtime.   apixaban  2.5 MG Tabs tablet Commonly known as: Eliquis  Take 1 tablet (2.5 mg total) by mouth 2 (two) times daily.   carvedilol  3.125 MG tablet Commonly known as: COREG  Take 1 tablet (3.125 mg total) by mouth 2 (two) times daily with a meal.   [employer] Use with the albuterol  inhaler   hydroxyurea  500 MG capsule Commonly known as: HYDREA  TAKE ONE CAPSULE BY MOUTH EVERY MONDAY, WEDNESDAY, AND FRIDAY FOR ELEVATED PLATELETS   losartan  25 MG tablet Commonly known as: COZAAR  Take 0.5 tablets (12.5 mg total) by mouth daily. Start taking on: February 21, 2024   methocarbamol  500 MG tablet Commonly known as: ROBAXIN  Take 1 tablet (500 mg total) by mouth every 8 (  eight) hours as needed for muscle spasms.   omeprazole 40 MG capsule Commonly known as: PRILOSEC Take 40 mg by mouth daily as needed (heartburn).   ondansetron  4 MG disintegrating tablet Commonly known as: ZOFRAN -ODT Take 4 mg by mouth every 6 (six) hours as needed for nausea/vomiting.   oxyCODONE  5 MG immediate release tablet Commonly known as: Oxy IR/ROXICODONE  Take 0.5-1 tablets (2.5-5 mg total) by mouth every 4 (four) hours as needed (2.5 mg pain socre 4-6, 5 mg pain score 7-10).   polyethylene glycol 17 g packet Commonly known as: MIRALAX  / GLYCOLAX  Take 17 g by mouth daily as needed for mild constipation.   senna-docusate 8.6-50 MG tablet Commonly  known as: Senokot-S Take 1 tablet by mouth 2 (two) times daily.   triamcinolone  cream 0.1 % Commonly known as: KENALOG  Apply 1 application topically 2 (two) times daily as needed (eczema).        Follow-up Information     [facility]. Go in 7 day(s).   Specialty: Cardiology Why: Hospital follow up 02/23/2024 @ 11 am PLEASE bring a current medication list to appointment FREE valet parking, Entrance C, off [employer] information: [address] [place]  [address] [phone]        Laneta Pintos, MD. Schedule an appointment as soon as possible for a visit in 2 week(s).   Specialty: Orthopedic Surgery Why: for wound check and repeat x-rays Contact information: [address] [place] [address] [phone]                Allergies  Allergen Reactions   Codeine Nausea Only and Other (See Comments)    hallucinations   Hydrocodone  Nausea Only and Other (See Comments)    Consultations: Orthopedics, Dr. Curtiss Dowdy Cardiology   Procedures/Studies: DG Ankle Right Port Result Date: 02/15/2024 CLINICAL DATA:  Fracture, postop. EXAM: PORTABLE RIGHT ANKLE - 2 VIEW COMPARISON:  Preoperative imaging FINDINGS: Medial plate and screw fixation of distal tibial fracture. Improved fracture alignment from preoperative imaging. Distal fibular fracture is also in improved alignment. Overlying splint material limits osseous and soft tissue fine detail. IMPRESSION: ORIF of distal tibial fracture. Electronically Signed   By: Chadwick Colonel M.D.   On: 02/15/2024 16:56   DG Ankle Complete Right Result Date: 02/15/2024 CLINICAL DATA:  Elective surgery. EXAM: RIGHT ANKLE - COMPLETE 3+ VIEW COMPARISON:  Preoperative imaging FINDINGS: Six fluoroscopic spot views of the right ankle submitted from the operating room. Plate and screw fixation of distal tibial fracture. Improved alignment of distal fibular  fracture. Fluoroscopy time 1 minutes 25 seconds. Dose 1.75 mGy. IMPRESSION: Intraoperative fluoroscopy during distal tibial fracture fixation. Electronically Signed   By: Chadwick Colonel M.D.   On: 02/15/2024 16:55   DG C-Arm 1-60 Min-No Report Result Date: 02/15/2024 Fluoroscopy was utilized by the requesting physician.  No radiographic interpretation.   DG C-Arm 1-60 Min-No Report Result Date: 02/15/2024 Fluoroscopy was utilized by the requesting physician.  No radiographic interpretation.   ECHOCARDIOGRAM COMPLETE Result Date: 02/12/2024    ECHOCARDIOGRAM REPORT   Patient Name:   NIOKA SETZLER [facility] Date of Exam: 02/12/2024 Medical Rec #:  [id number]         Height:       67.0 in Accession #:    [id number]        Weight:       188.5 lb Date of Birth:  21-Jan-1941  BSA:          1.972 m Patient Age:    82 years          BP:           102/56 mmHg Patient Gender: F                 HR:           78 bpm. Exam Location:  Inpatient Procedure: 2D Echo, Intracardiac Opacification Agent, Cardiac Doppler and Color            Doppler (Both Spectral and Color Flow Doppler were utilized during            procedure).                                MODIFIED REPORT: This report was modified by Ola Berger MD on 02/12/2024 due to COmplete report.  Indications:     Ventricular Tachycardia I47.2  History:         Patient has prior history of Echocardiogram examinations, most                  recent 08/27/2022. CHF, CAD; Risk Factors:Hypertension and                  Dyslipidemia.  Sonographer:     Gaston Karvonen, FE, PE Referring Phys:  [id number] Santana Cue OPYD Diagnosing Phys: Ola Berger MD IMPRESSIONS  1. Akinesis of the distal lateral, distal septal, distal inferior, distal anterior and apical walls with aneurysmal dilitation of the distal inferior and apical walls. Wall motion consistent with possible Takosubo's cardiomyopathy vs CAD. Compared to echo report from 08/27/22, these findings are new . Aaron Aas Left ventricular  ejection fraction, by estimation, is 25 to 30%. The left ventricle has severely decreased function.  2. Right ventricular systolic function is normal. The right ventricular size is normal.  3. A small pericardial effusion is present.  4. The mitral valve is normal in structure. Trivial mitral valve regurgitation.  5. The aortic valve is tricuspid. Aortic valve regurgitation is not visualized.  6. The inferior vena cava is normal in size with <50% respiratory variability, suggesting right atrial pressure of 8 mmHg. FINDINGS  Left Ventricle: Akinesis of the distal lateral, distal septal, distal inferior, distal anterior and apical walls with aneurysmal dilitation of the distal inferior and apical walls. Wall motion consistent with possible Takosubo's cardiomyopathy vs CAD. Compared to echo report from 08/27/22, these findings are new. Left ventricular ejection fraction, by estimation, is 25 to 30%. The left ventricle has severely decreased function. Definity  contrast agent was given IV to delineate the left ventricular endocardial borders. The left ventricular internal cavity size was normal in size. There is no left ventricular hypertrophy. Right Ventricle: The right ventricular size is normal. Right vetricular wall thickness was not assessed. Right ventricular systolic function is normal. Left Atrium: Left atrial size was normal in size. Right Atrium: Right atrial size was normal in size. Pericardium: A small pericardial effusion is present. Mitral Valve: The mitral valve is normal in structure. Trivial mitral valve regurgitation. Tricuspid Valve: The tricuspid valve is normal in structure. Tricuspid valve regurgitation is trivial. Aortic Valve: The aortic valve is tricuspid. Aortic valve regurgitation is not visualized. Aortic valve mean gradient measures 3.0 mmHg. Aortic valve peak gradient measures 5.9 mmHg. Aortic valve area, by VTI measures 2.90 cm. Pulmonic Valve: The pulmonic valve  was normal in structure.  Pulmonic valve regurgitation is not visualized. Aorta: The aortic root and ascending aorta are structurally normal, with no evidence of dilitation. Venous: The inferior vena cava is normal in size with less than 50% respiratory variability, suggesting right atrial pressure of 8 mmHg. IAS/Shunts: No atrial level shunt detected by color flow Doppler.  LEFT VENTRICLE PLAX 2D LVIDd:         4.90 cm   Diastology LVIDs:         3.30 cm   LV e' medial:    5.87 cm/s LV PW:         0.90 cm   LV E/e' medial:  16.0 LV IVS:        1.00 cm   LV e' lateral:   8.27 cm/s LVOT diam:     2.20 cm   LV E/e' lateral: 11.3 LV SV:         71 LV SV Index:   36 LVOT Area:     3.80 cm  RIGHT VENTRICLE RV Basal diam:  3.20 cm RV Mid diam:    2.90 cm RV S prime:     8.38 cm/s TAPSE (M-mode): 1.8 cm LEFT ATRIUM             Index        RIGHT ATRIUM           Index LA Vol (A2C):   48.9 ml 24.80 ml/m  RA Area:     16.50 cm LA Vol (A4C):   53.5 ml 27.13 ml/m  RA Volume:   43.60 ml  22.11 ml/m LA Biplane Vol: 50.9 ml 25.81 ml/m  AORTIC VALVE AV Area (Vmax):    2.81 cm AV Area (Vmean):   2.80 cm AV Area (VTI):     2.90 cm AV Vmax:           121.00 cm/s AV Vmean:          83.200 cm/s AV VTI:            0.244 m AV Peak Grad:      5.9 mmHg AV Mean Grad:      3.0 mmHg LVOT Vmax:         89.60 cm/s LVOT Vmean:        61.300 cm/s LVOT VTI:          0.186 m LVOT/AV VTI ratio: 0.76  AORTA Ao Root diam: 3.00 cm Ao Asc diam:  3.70 cm MITRAL VALVE MV Area (PHT): 4.06 cm    SHUNTS MV Decel Time: 187 msec    Systemic VTI:  0.19 m MV E velocity: 93.80 cm/s  Systemic Diam: 2.20 cm MV A velocity: 87.50 cm/s MV E/A ratio:  1.07 Ola Berger MD Electronically signed by Ola Berger MD Signature Date/Time: 02/12/2024/4:35:17 PM    Final (Updated)    US  RENAL Result Date: 02/11/2024 CLINICAL DATA:  [id number] AKI (acute kidney injury) [facility]) [id number] Left flank pain [id number]. Right nephrectomy history. EXAM: RENAL / URINARY TRACT ULTRASOUND COMPLETE COMPARISON:   CT renal 03/31/2021 FINDINGS: Right Kidney: Surgically absent. Left Kidney: Renal measurements: 14.2 x 6.6 x 7.3 cm = volume: 355 mL. Echogenicity within normal limits. There is a 7.4 x 6.6 x 6 cm simple left superior renal pole cystic lesion. No solid mass. No hydronephrosis visualized. Urinary bladder: Appears normal for degree of bladder distention. Other: None. IMPRESSION: 1. Status post right nephrectomy. 2. Otherwise unremarkable renal ultrasound with grossly stable 7.5 cm  left simple renal cyst. Electronically Signed   By: Morgane  Naveau M.D.   On: 02/11/2024 22:27   CT Head Wo Contrast Result Date: 02/11/2024 CLINICAL DATA:  Head and neck trauma.  Fall.  On blood thinners. EXAM: CT HEAD WITHOUT CONTRAST CT CERVICAL SPINE WITHOUT CONTRAST TECHNIQUE: Multidetector CT imaging of the head and cervical spine was performed following the standard protocol without intravenous contrast. Multiplanar CT image reconstructions of the cervical spine were also generated. RADIATION DOSE REDUCTION: This exam was performed according to the departmental dose-optimization program which includes automated exposure control, adjustment of the mA and/or kV according to patient size and/or use of iterative reconstruction technique. COMPARISON:  None Available. FINDINGS: CT HEAD FINDINGS Brain: No intracranial hemorrhage, mass effect, or evidence of acute infarct. No hydrocephalus. No extra-axial fluid collection. Age related cerebral atrophy and chronic small vessel ischemic disease. Focus of hypoattenuation in the left putamen and thalamus are favored to represent chronic lacunar infarcts. Vascular: No hyperdense vessel. Intracranial arterial calcification. Skull: No fracture or focal lesion. Sinuses/Orbits: No acute finding. Other: None. CT CERVICAL SPINE FINDINGS Alignment: No evidence of traumatic malalignment. Grade 1 anterolisthesis of C2, C3, and C7 is presumed chronic. Skull base and vertebrae: No acute fracture. No  primary bone lesion or focal pathologic process. Soft tissues and spinal canal: No prevertebral fluid or swelling. No visible canal hematoma. Disc levels: Multilevel advanced spondylosis, disc space height loss, degenerative endplate changes greatest at C4-C5, C5-C6, and C6-C7. Upper chest: No acute abnormality. Other: Carotid calcification. IMPRESSION: 1. No acute intracranial abnormality. 2. No acute fracture in the cervical spine. Electronically Signed   By: Rozell Cornet M.D.   On: 02/11/2024 18:44   CT Cervical Spine Wo Contrast Result Date: 02/11/2024 CLINICAL DATA:  Head and neck trauma.  Fall.  On blood thinners. EXAM: CT HEAD WITHOUT CONTRAST CT CERVICAL SPINE WITHOUT CONTRAST TECHNIQUE: Multidetector CT imaging of the head and cervical spine was performed following the standard protocol without intravenous contrast. Multiplanar CT image reconstructions of the cervical spine were also generated. RADIATION DOSE REDUCTION: This exam was performed according to the departmental dose-optimization program which includes automated exposure control, adjustment of the mA and/or kV according to patient size and/or use of iterative reconstruction technique. COMPARISON:  None Available. FINDINGS: CT HEAD FINDINGS Brain: No intracranial hemorrhage, mass effect, or evidence of acute infarct. No hydrocephalus. No extra-axial fluid collection. Age related cerebral atrophy and chronic small vessel ischemic disease. Focus of hypoattenuation in the left putamen and thalamus are favored to represent chronic lacunar infarcts. Vascular: No hyperdense vessel. Intracranial arterial calcification. Skull: No fracture or focal lesion. Sinuses/Orbits: No acute finding. Other: None. CT CERVICAL SPINE FINDINGS Alignment: No evidence of traumatic malalignment. Grade 1 anterolisthesis of C2, C3, and C7 is presumed chronic. Skull base and vertebrae: No acute fracture. No primary bone lesion or focal pathologic process. Soft tissues  and spinal canal: No prevertebral fluid or swelling. No visible canal hematoma. Disc levels: Multilevel advanced spondylosis, disc space height loss, degenerative endplate changes greatest at C4-C5, C5-C6, and C6-C7. Upper chest: No acute abnormality. Other: Carotid calcification. IMPRESSION: 1. No acute intracranial abnormality. 2. No acute fracture in the cervical spine. Electronically Signed   By: Rozell Cornet M.D.   On: 02/11/2024 18:44   CT ANKLE RIGHT WO CONTRAST Result Date: 02/11/2024 CLINICAL DATA:  Ankle fracture after fall EXAM: CT OF THE RIGHT ANKLE WITHOUT CONTRAST TECHNIQUE: Multidetector CT imaging of the right ankle was performed according to the standard  protocol. Multiplanar CT image reconstructions were also generated. RADIATION DOSE REDUCTION: This exam was performed according to the departmental dose-optimization program which includes automated exposure control, adjustment of the mA and/or kV according to patient size and/or use of iterative reconstruction technique. COMPARISON:  Same day ankle radiographs FINDINGS: Bones/Joint/Cartilage Demineralization. Acute comminuted fracture of the distal tibial metadiaphysis. There is apex medial/posterior angulation. The fracture lines extend into the tibiofibular syndesmosis 4.5 cm above the ankle mortise. No evidence of extension into the tibiotalar joint. Remote fracture fragments about the medial malleolus. Acute comminuted fracture of the distal fibula diaphysis. There is apex medial/posterior angulation. Ligaments Suboptimally assessed by CT. Muscles and Tendons Diffuse atrophy. Soft tissues Soft tissue swelling about the ankle and calcaneus. IMPRESSION: Acute comminuted fractures of the distal tibia and fibula as described. Electronically Signed   By: Rozell Cornet M.D.   On: 02/11/2024 18:36   DG Pelvis 1-2 Views Result Date: 02/11/2024 CLINICAL DATA:  Fall. EXAM: PELVIS - 1-2 VIEW COMPARISON:  01/04/2024. FINDINGS: No acute fracture  or diastasis. Prior right hip arthroplasty with appropriate alignment. Sacroiliac joints and pubic symphysis appear anatomically aligned. Partially visualized lower lumbar fusion hardware. IMPRESSION: No acute osseous abnormality identified. Electronically Signed   By: Mannie Seek M.D.   On: 02/11/2024 16:35   DG Ankle 2 Views Right Result Date: 02/11/2024 CLINICAL DATA:  Trauma, fall. EXAM: RIGHT ANKLE - 2 VIEW COMPARISON:  None Available. FINDINGS: There is an acute oblique mildly comminuted fracture of the distal tibial diaphysis. There is 1 cm of posterior distraction of the distal fracture fragment with mild apex medial angulation. There is also an acute comminuted fracture of the distal fibular diaphysis at the same level with apex medial angulation. There is no definite dislocation, although evaluation is limited secondary to patient positioning. There is soft tissue swelling surrounding the fractures stray IMPRESSION: Acute comminuted fractures of the distal tibial and fibular diaphyses with apex medial angulation. Electronically Signed   By: Tyron Gallon M.D.   On: 02/11/2024 16:33   DG Chest Portable 1 View Result Date: 02/11/2024 CLINICAL DATA:  Status post fall on blood thinners EXAM: PORTABLE CHEST 1 VIEW COMPARISON:  Chest radiograph dated 01/11/2024 FINDINGS: Low lung volumes with bronchovascular crowding. Left basilar patchy opacities. No pleural effusion or pneumothorax. The heart size and mediastinal contours are within normal limits. Questionable transversely oriented lucency through the left lateral fifth rib. IMPRESSION: 1. Low lung volumes with bronchovascular crowding. Left basilar patchy opacities, likely atelectasis. 2. Questionable nondisplaced left lateral fifth rib fracture. Recommend correlation with point tenderness. Electronically Signed   By: Limin  Xu M.D.   On: 02/11/2024 16:32     Subjective: Patient seen and examined at bedside, lying in bed.  Pain controlled.   Family present.  Received insurance authorization and discharged to SNF today.  Denies headache, no visual changes, no chest pain, no palpitations, no shortness of breath, no abdominal pain, no fever/chills/night sweats, no nausea/vomiting/diarrhea, no focal weakness, no fatigue, no paresthesias.  No acute events overnight per nursing staff.  Discharge Exam: Vitals:   02/20/24 0459 02/20/24 0738  BP: 125/61 121/61  Pulse: 69 69  Resp: 18 16  Temp: 98 F (36.7 C) 98.3 F (36.8 C)  SpO2: 96% 100%   Vitals:   02/19/24 2044 02/20/24 0459 02/20/24 0500 02/20/24 0738  BP: (!) 128/57 125/61  121/61  Pulse: 65 69  69  Resp: 16 18  16   Temp: 98.3 F (36.8 C) 98 F (36.7  C)  98.3 F (36.8 C)  TempSrc: Oral Oral  Oral  SpO2: 97% 96%  100%  Weight:   88.6 kg   Height:        Physical Exam: GEN: NAD, alert and oriented x 3, wd/wn HEENT: NCAT, PERRL, EOMI, sclera clear, MMM PULM: CTAB w/o wheezes/crackles, normal respiratory effort, on room air CV: RRR w/o M/G/R GI: abd soft, NTND, NABS, no R/G/M MSK: no peripheral edema, moves all extremities independently, noted right lower extremity with splint/ACE warp in place, C/D/I, neurovascular intact NEURO: No focal neurological deficits PSYCH: normal mood/affect Integumentary: Right lower extremity with splint/surgical dressing/Ace wrap in place, clean/dry/intact, otherwise no other concerning rashes/lesions/wounds noted on exposed skin surfaces    The results of significant diagnostics from this hospitalization (including imaging, microbiology, ancillary and laboratory) are listed below for reference.     Microbiology: Recent Results (from the past 240 hours)  Culture, blood (x 2)     Status: None   Collection Time: 02/11/24  7:20 PM   Specimen: BLOOD LEFT ARM  Result Value Ref Range Status   Specimen Description BLOOD LEFT ARM  Final   Special Requests   Final    BOTTLES DRAWN AEROBIC AND ANAEROBIC Blood Culture results may not be  optimal due to an inadequate volume of blood received in culture bottles   Culture   Final    NO GROWTH 5 DAYS Performed at [facility], [address] [place], [place] [address]    Report Status 02/16/2024 FINAL  Final  Culture, blood (x 2)     Status: None   Collection Time: 02/12/24  6:31 AM   Specimen: BLOOD RIGHT HAND  Result Value Ref Range Status   Specimen Description BLOOD RIGHT HAND  Final   Special Requests   Final    AEROBIC BOTTLE ONLY Blood Culture results may not be optimal due to an inadequate volume of blood received in culture bottles   Culture   Final    NO GROWTH 5 DAYS Performed at [facility], [address], [place], [place] [address]    Report Status 02/17/2024 FINAL  Final  Urine Culture (for pregnant, neutropenic or urologic patients or patients with an indwelling urinary catheter)     Status: Abnormal   Collection Time: 02/12/24  6:05 PM   Specimen: Urine, Clean Catch  Result Value Ref Range Status   Specimen Description URINE, CLEAN CATCH  Final   Special Requests   Final    NONE Performed at [facility], [address], [place], [place] [address]    Culture 20,000 COLONIES/mL ENTEROCOCCUS FAECALIS (A)  Final   Report Status 02/14/2024 FINAL  Final   Organism ID, Bacteria ENTEROCOCCUS FAECALIS (A)  Final      Susceptibility   Enterococcus faecalis - MIC*    AMPICILLIN >=32 RESISTANT Resistant     NITROFURANTOIN  <=16 SENSITIVE Sensitive     VANCOMYCIN  2 SENSITIVE Sensitive     * 20,000 COLONIES/mL ENTEROCOCCUS FAECALIS  Surgical pcr screen     Status: None   Collection Time: 02/14/24 11:42 PM   Specimen: Nasal Mucosa; Nasal Swab  Result Value Ref Range Status   MRSA, PCR NEGATIVE NEGATIVE Final   Staphylococcus aureus NEGATIVE NEGATIVE Final    Comment: (NOTE) The Xpert SA Assay (FDA approved for NASAL specimens in patients 52 years of age and older), is one component of a comprehensive surveillance program. It is not  intended to diagnose infection  nor to guide or monitor treatment. Performed at [facility], [address], [place], [place] [address]      Labs: BNP (last 3 results) No results for input(s): "BNP" in the last 8760 hours. Basic Metabolic Panel: Recent Labs  Lab 02/14/24 0412 02/15/24 0152 02/16/24 0639 02/17/24 0646  NA 131* 130* 128* 130*  K 4.7 4.2 4.6 4.0  CL 102 103 98 97*  CO2 19* 18* 18* 23  GLUCOSE 87 85 89 87  BUN 28* 24* 16 13  CREATININE 1.44* 1.21* 1.07* 1.09*  CALCIUM  8.2* 8.3* 8.4* 8.3*   Liver Function Tests: Recent Labs  Lab 02/17/24 0646  AST 17  ALT 6  ALKPHOS 51  BILITOT 0.7  PROT 5.5*  ALBUMIN 1.9*   No results for input(s): "LIPASE", "AMYLASE" in the last 168 hours. No results for input(s): "AMMONIA" in the last 168 hours. CBC: Recent Labs  Lab 02/14/24 0412 02/15/24 0152 02/16/24 0639 02/17/24 0646  WBC 13.2* 10.1 15.0* 13.2*  HGB 13.4 12.0 13.5 12.9  HCT 42.2 37.9 42.4 39.6  MCV 89.6 89.0 88.7 87.2  PLT 340 402* 500* 550*   Cardiac Enzymes: No results for input(s): "CKTOTAL", "CKMB", "CKMBINDEX", "TROPONINI" in the last 168 hours. BNP: Invalid input(s): "POCBNP" CBG: No results for input(s): "GLUCAP" in the last 168 hours. D-Dimer No results for input(s): "DDIMER" in the last 72 hours. Hgb A1c No results for input(s): "HGBA1C" in the last 72 hours. Lipid Profile No results for input(s): "CHOL", "HDL", "LDLCALC", "TRIG", "CHOLHDL", "LDLDIRECT" in the last 72 hours. Thyroid  function studies No results for input(s): "TSH", "T4TOTAL", "T3FREE", "THYROIDAB" in the last 72 hours.  Invalid input(s): "FREET3" Anemia work up No results for input(s): "VITAMINB12", "FOLATE", "FERRITIN", "TIBC", "IRON", "RETICCTPCT" in the last 72 hours. Urinalysis    Component Value Date/Time   COLORURINE YELLOW 02/11/2024 1809   APPEARANCEUR CLOUDY (A) 02/11/2024 1809   LABSPEC 1.014 02/11/2024 1809   PHURINE 5.0 02/11/2024 1809    GLUCOSEU NEGATIVE 02/11/2024 1809   HGBUR MODERATE (A) 02/11/2024 1809   BILIRUBINUR NEGATIVE 02/11/2024 1809   KETONESUR NEGATIVE 02/11/2024 1809   PROTEINUR 100 (A) 02/11/2024 1809   UROBILINOGEN 1.0 06/04/2013 1250   NITRITE POSITIVE (A) 02/11/2024 1809   LEUKOCYTESUR LARGE (A) 02/11/2024 1809   Sepsis Labs Recent Labs  Lab 02/14/24 0412 02/15/24 0152 02/16/24 0639 02/17/24 0646  WBC 13.2* 10.1 15.0* 13.2*   Microbiology Recent Results (from the past 240 hours)  Culture, blood (x 2)     Status: None   Collection Time: 02/11/24  7:20 PM   Specimen: BLOOD LEFT ARM  Result Value Ref Range Status   Specimen Description BLOOD LEFT ARM  Final   Special Requests   Final    BOTTLES DRAWN AEROBIC AND ANAEROBIC Blood Culture results may not be optimal due to an inadequate volume of blood received in culture bottles   Culture   Final    NO GROWTH 5 DAYS Performed at [facility], [address] [place], [place] [address]    Report Status 02/16/2024 FINAL  Final  Culture, blood (x 2)     Status: None   Collection Time: 02/12/24  6:31 AM   Specimen: BLOOD RIGHT HAND  Result Value Ref Range Status   Specimen Description BLOOD RIGHT HAND  Final   Special Requests   Final    AEROBIC BOTTLE ONLY Blood Culture results may not be optimal due to an inadequate volume of blood received  in culture bottles   Culture   Final    NO GROWTH 5 DAYS Performed at [facility], [address], [place], [place] [address]    Report Status 02/17/2024 FINAL  Final  Urine Culture (for pregnant, neutropenic or urologic patients or patients with an indwelling urinary catheter)     Status: Abnormal   Collection Time: 02/12/24  6:05 PM   Specimen: Urine, Clean Catch  Result Value Ref Range Status   Specimen Description URINE, CLEAN CATCH  Final   Special Requests   Final    NONE Performed at [facility], [address] [place], [place] [address]    Culture 20,000 COLONIES/mL  ENTEROCOCCUS FAECALIS (A)  Final   Report Status 02/14/2024 FINAL  Final   Organism ID, Bacteria ENTEROCOCCUS FAECALIS (A)  Final      Susceptibility   Enterococcus faecalis - MIC*    AMPICILLIN >=32 RESISTANT Resistant     NITROFURANTOIN  <=16 SENSITIVE Sensitive     VANCOMYCIN  2 SENSITIVE Sensitive     * 20,000 COLONIES/mL ENTEROCOCCUS FAECALIS  Surgical pcr screen     Status: None   Collection Time: 02/14/24 11:42 PM   Specimen: Nasal Mucosa; Nasal Swab  Result Value Ref Range Status   MRSA, PCR NEGATIVE NEGATIVE Final   Staphylococcus aureus NEGATIVE NEGATIVE Final    Comment: (NOTE) The Xpert SA Assay (FDA approved for NASAL specimens in patients 81 years of age and older), is one component of a comprehensive surveillance program. It is not intended to diagnose infection nor to guide or monitor treatment. Performed at [facility], [address], [place], [place] [address]      Time coordinating discharge: Over 30 minutes  SIGNED:   Rema Care [place], DO  [facility] 02/20/2024, 11:46 AM

## 2024-02-22 DIAGNOSIS — M84371D Stress fracture, right ankle, subsequent encounter for fracture with routine healing: Secondary | ICD-10-CM | POA: Diagnosis not present

## 2024-02-22 DIAGNOSIS — N1831 Chronic kidney disease, stage 3a: Secondary | ICD-10-CM | POA: Diagnosis not present

## 2024-02-22 DIAGNOSIS — I2510 Atherosclerotic heart disease of native coronary artery without angina pectoris: Secondary | ICD-10-CM | POA: Diagnosis not present

## 2024-02-22 DIAGNOSIS — R262 Difficulty in walking, not elsewhere classified: Secondary | ICD-10-CM | POA: Diagnosis not present

## 2024-02-23 ENCOUNTER — Encounter (HOSPITAL_COMMUNITY)

## 2024-02-26 NOTE — Progress Notes (Signed)
 HEART & VASCULAR TRANSITION OF CARE CONSULT NOTE   PCP: Gaither Juba, MD  Cardiologist: Dr. Gordan Latina    HPI: Referred to clinic by Dr. [place] for heart failure consultation.   [name] [name] is a 83 y.o. female with a hx of PAF, hyperlipidemia, HFpEF, hypertension, PVCs, right nephrectomy, thrombocythemia, and first degree heart block.  Admitted 4/25 with fall. he was reduced in the field. Orthopedic surgery consulted and s/p ORIF on 4/21. Cardiology consulted due to runs of NSVT. Received IV amiodarone  and magnesium . ECHO on 4/18 with LVEF 25-30%, wall motion consistent with Takotsubo vs CAD, RV normal, trivial MR. Troponin was 1228>755. Felt to be most consistent with stress cardiomyopathy. GDMT titrated, she was discharged home weight 195 lbs.  Today she presents to [facility] for post hospital follow up. Overall feeling fine. Denies increasing SOB, palpitations, abnormal bleeding, CP, dizziness, edema, or PND/Orthopnea. Appetite ok. No fever or chills. Weight at home 170 pounds. Taking all medications.     Cardiac Testing  - Echo 4/25: EF 25-30%, Akinesis of the distal lateral, distal septal, distal inferior, distal  anterior and apical walls with aneurysmal dilitation of the distal inferior and apical walls Takosubo's cardiomyopathy vs CAD   - Echo 11/23: EF 60-65%, RV ok   Past Medical History:  Diagnosis Date   Abdominal aortic atherosclerosis (HCC) 05/07/2016   Noted on CT abd/pelvis   Acute renal failure (HCC) 06/04/2013   Acute respiratory failure with hypoxia (HCC) 06/04/2013   Anemia, unspecified 06/08/2013   Anticoagulant long-term use    ELIQUIS    Cancer (HCC)    uterine cancer    Chronic anticoagulation 08/04/2017   Chronic diastolic (congestive) heart failure (HCC)     pt denies    Chronic diastolic congestive heart failure (HCC) 05/31/2015   Chronic kidney disease 2014   arf on dialysis in icu   Complicated UTI (urinary tract infection) 03/31/2021    Complication of anesthesia    2021- kidney removed at [facility] pt reports did not get enough med to wake her up and she could not talk and then recovered after given more of wake up med from anesthesia    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 Wayna Hails [facility] in [place])  pt uses pessary   Diarrhea 06/11/2013   Diverticulosis 05/07/2016   Noted on CT abd/pelvis   Dyslipidemia 05/31/2015   E coli bacteremia 06/08/2013   Encounter for monitoring flecainide  therapy 07/30/2016   Essential thrombocythemia (HCC) 03/04/2021   Female genital prolapse 04/15/2016   First degree heart block    General weakness    GERD (gastroesophageal reflux disease)    Hematuria    intermittently due to ureter right stent   Hiatal hernia    High risk medication use 04/15/2016   History of arteriovenostomy for renal dialysis [facility])    History of colon polyps    History of gastric polyp    History of kidney infection    12-31-2018  perinephrtic abscess  right side  s/p drains x2 01-10-2019,  05/ 2020 drains removed   History of kidney stones    History of peptic ulcer disease    History of septic shock    2011 (pt unaware) and 06-04-2013  w/ acute renal failure   History of uterine cancer    STAGE I  --  S/P TAH W/ BSO  (NO  OTHER TX)   History of ventilator dependency 2014   in setting of septic shock   Hydronephrosis 05/07/2016   Hyperlipemia 04/15/2016   Hyperlipidemia    Hypertension    Iron deficiency anemia hematology/ oncology--- dr Harles Lied at [facility] in [place]--- per lov note 08-07-2017  stabilized and felt to be more chronic anemia   01/ 2018  dx iron def. anemia  s/p  IV Iron infusion and taking oral iron supplement   Lactic acidosis 06/04/2013   Lumbar compression fracture (HCC)    01-22-2019 per imaging L1  (06-01-2019 per pt currently no pain)   Midline cystocele 04/15/2016    Nonfunctioning kidney 03/23/2020   NSTEMI, initial episode of care [facility]) 06/07/2013   pt denies    Occlusion of ureteral stent (HCC)    PAF (paroxysmal atrial fibrillation) (HCC) DX JUNE 2012   CARDIOLOGIST--  DR Parks Bollman--- [facility])  CHADS2   Palpitations 04/15/2016   Paroxysmal atrial fibrillation (HCC) 08/04/2017   Presence of pessary    Pyelonephritis 06/04/2013   Pyelonephritis of left kidney 06/04/2013   Right wrist fracture    per pt fractured on 03-10-2019,  had closed reduction and wearing a brace   Sepsis (HCC) 05/07/2016   Septic shock (HCC) 06/04/2013   IMO SNOMED Dx Update Oct 2024     Septic shock(785.52) 06/04/2013   Tendonitis, Achilles 04/15/2016   Ureteral obstruction, right 06/04/2013   Ureteral stricture, right urologist-- dr Secundino Dach   Chronic--- treated with ureteral stent   Urinary tract infection without hematuria    Uses walker    and wheelchair for longer distance   Ventricular ectopy 12/28/2020   Wears glasses     Current Outpatient Medications  Medication Sig Dispense Refill   acetaminophen  (TYLENOL ) 500 MG tablet Take 1,000 mg by mouth every 6 (six) hours as needed for moderate pain or headache.      albuterol  (VENTOLIN  HFA) 108 (90 Base) MCG/ACT inhaler Inhale 2 puffs into the lungs every 4 (four) hours as needed for wheezing or shortness of breath. 1 each 0   amiodarone  (PACERONE ) 200 MG tablet Take 1 tablet (200 mg total) by mouth daily.     amitriptyline  (ELAVIL ) 25 MG tablet Take 25 mg by mouth at bedtime.     apixaban  (ELIQUIS ) 2.5 MG TABS tablet Take 1 tablet (2.5 mg total) by mouth 2 (two) times daily. 60 tablet 5   carvedilol  (COREG ) 3.125 MG tablet Take 1 tablet (3.125 mg total) by mouth 2 (two) times daily with a meal.     Cholecalciferol  1.25 MG (50000 UT) capsule Take 1 capsule (50,000 Units total) by mouth once a week for 8 doses.     hydroxyurea  (HYDREA ) 500 MG capsule TAKE ONE CAPSULE BY MOUTH EVERY  MONDAY, WEDNESDAY, AND FRIDAY FOR ELEVATED PLATELETS 36 capsule 2   losartan  (COZAAR ) 25 MG tablet Take 0.5 tablets (12.5 mg total) by mouth daily.     methocarbamol  (ROBAXIN ) 500 MG tablet Take 1 tablet (500 mg total) by mouth every 8 (eight) hours as needed for muscle spasms. 20 tablet 0   omeprazole (PRILOSEC) 40 MG capsule Take 40 mg by mouth daily as needed (heartburn).     ondansetron  (ZOFRAN -ODT) 4 MG disintegrating tablet Take 4 mg by mouth every 6 (six) hours as needed for nausea/vomiting.     oxyCODONE  (OXY IR/ROXICODONE ) 5 MG immediate release tablet Take 0.5-1 tablets (2.5-5 mg total) by mouth every 4 (four) hours as needed (  2.5 mg pain socre 4-6, 5 mg pain score 7-10). 30 tablet 0   polyethylene glycol (MIRALAX  / GLYCOLAX ) 17 g packet Take 17 g by mouth daily as needed for mild constipation.     senna-docusate (SENOKOT-S) 8.6-50 MG tablet Take 1 tablet by mouth 2 (two) times daily.     Spacer/Aero-Holding Chambers (COMPACT SPACE CHAMBER) DEVI Use with the albuterol  inhaler 1 each 0   triamcinolone  cream (KENALOG ) 0.1 % Apply 1 application topically 2 (two) times daily as needed (eczema).     No current facility-administered medications for this visit.    Allergies  Allergen Reactions   Codeine Nausea Only and Other (See Comments)    hallucinations   Hydrocodone  Nausea Only and Other (See Comments)      Social History   Socioeconomic History   Marital status: Married    Spouse name: Adaline Holly   Number of children: 2   Years of education: Not on file   Highest education level: High school graduate  Occupational History   Occupation: Retired from [employer] education [employer]  Tobacco Use   Smoking status: Never   Smokeless tobacco: Never  Vaping Use   Vaping status: Never Used  Substance and Sexual Activity   Alcohol use: No   Drug use: No   Sexual activity: Not on file  Other Topics Concern   Not on file  Social History Narrative   Lives in [place] with husband. Drives, walks some,  but not very active at home.    Social Drivers of Corporate investment banker Strain: Low Risk  (02/16/2024)   Overall Financial Resource Strain (CARDIA)    Difficulty of Paying Living Expenses: Not very hard  Food Insecurity: No Food Insecurity (02/12/2024)   Hunger Vital Sign    Worried About Running Out of Food in the Last Year: Never true    Ran Out of Food in the Last Year: Never true  Transportation Needs: No Transportation Needs (02/16/2024)   PRAPARE - Administrator, Civil Service (Medical): No    Lack of Transportation (Non-Medical): No  Physical Activity: Not on file  Stress: Not on file  Social Connections: Socially Integrated (02/12/2024)   Social Connection and Isolation Panel [NHANES]    Frequency of Communication with Friends and Family: More than three times a week    Frequency of Social Gatherings with Friends and Family: Three times a week    Attends Religious Services: 1 to 4 times per year    Active Member of Clubs or Organizations: Yes    Attends Banker Meetings: 1 to 4 times per year    Marital Status: Married  Catering manager Violence: Not At Risk (02/12/2024)   Humiliation, Afraid, Rape, and Kick questionnaire    Fear of Current or Ex-Partner: No    Emotionally Abused: No    Physically Abused: No    Sexually Abused: No      Family History  Problem Relation Age of Onset   Heart disease Mother    Heart failure Mother    Cancer Mother    Heart disease Father    Heart failure Father     There were no vitals filed for this visit.  PHYSICAL EXAM: General:  NAD. No resp difficulty HEENT: Normal Neck: Supple. No JVD. Cor: Regular rate & rhythm. No rubs, gallops or murmurs. Lungs: Clear Abdomen: Soft, nontender, nondistended.  Extremities: No cyanosis, clubbing, rash, edema Neuro: Alert & oriented x 3, moves all 4 extremities w/o  difficulty. Affect pleasant.  ECG (personally reviewed):  ASSESSMENT & PLAN: Chronic Systolic  Heart Failure - Echo 4/25: EF 25%, consistent with stress CM - NYHA ***, volume *** - Continue - Continue losartan  12.5 mg daily - Continue Coreg  3.125 mg bid - No SGLT2i with recent UTI  2. PAF/NSVT - Off fleccanide with newly reduced EF - Continue amiodarone  200 mg daily  3. CKD 3 - Baseline SCr - Labs stable  NYHA *** GDMT  Diuretic: BB: Ace/ARB/ARNI: MRA: SGLT2i:  Referred to HFSW (PCP, Medications, Transportation, ETOH Abuse, Drug Abuse, Insurance, Financial ): Yes or No Refer to Pharmacy: Yes or No Refer to Home Health: Yes on No Refer to Advanced [facility] Failure Clinic: No Refer to General Cardiology: No, established with Gen Cards  Follow up as needed.  Vernia Good, FNP-BC 02/26/24

## 2024-02-29 ENCOUNTER — Telehealth (HOSPITAL_COMMUNITY): Payer: Self-pay

## 2024-02-29 NOTE — Telephone Encounter (Signed)
 Called to confirm/remind patient of their appointment at the [facility] on 03/01/24 10:15.   Appointment:   [] Confirmed  [x] Left mess   [] No answer/No voice mail  [] VM Full/unable to leave message  [] Phone not in service  Patient reminded to bring all medications and/or complete list.  Confirmed patient has transportation. Gave directions, instructed to utilize valet parking.

## 2024-03-01 ENCOUNTER — Ambulatory Visit (HOSPITAL_COMMUNITY)
Admission: RE | Admit: 2024-03-01 | Discharge: 2024-03-01 | Disposition: A | Discharge status: Home / Self Care | Source: Ambulatory Visit | Attending: Family Medicine | Admitting: Family Medicine

## 2024-03-01 ENCOUNTER — Encounter (HOSPITAL_COMMUNITY): Payer: Self-pay

## 2024-03-01 VITALS — BP 116/64 | HR 83 | Ht 67.0 in | Wt 181.0 lb

## 2024-03-01 DIAGNOSIS — Z79899 Other long term (current) drug therapy: Secondary | ICD-10-CM | POA: Diagnosis not present

## 2024-03-01 DIAGNOSIS — I480 Paroxysmal atrial fibrillation: Secondary | ICD-10-CM | POA: Diagnosis not present

## 2024-03-01 DIAGNOSIS — I493 Ventricular premature depolarization: Secondary | ICD-10-CM | POA: Insufficient documentation

## 2024-03-01 DIAGNOSIS — I5022 Chronic systolic (congestive) heart failure: Secondary | ICD-10-CM | POA: Diagnosis not present

## 2024-03-01 DIAGNOSIS — I130 Hypertensive heart and chronic kidney disease with heart failure and stage 1 through stage 4 chronic kidney disease, or unspecified chronic kidney disease: Secondary | ICD-10-CM | POA: Insufficient documentation

## 2024-03-01 DIAGNOSIS — N183 Chronic kidney disease, stage 3 unspecified: Secondary | ICD-10-CM | POA: Diagnosis not present

## 2024-03-01 DIAGNOSIS — Z7901 Long term (current) use of anticoagulants: Secondary | ICD-10-CM | POA: Diagnosis not present

## 2024-03-01 DIAGNOSIS — Z905 Acquired absence of kidney: Secondary | ICD-10-CM | POA: Diagnosis not present

## 2024-03-01 LAB — BASIC METABOLIC PANEL WITH GFR
Anion gap: 12 (ref 5–15)
BUN: 14 mg/dL (ref 8–23)
CO2: 23 mmol/L (ref 22–32)
Calcium: 8.9 mg/dL (ref 8.9–10.3)
Chloride: 92 mmol/L — ABNORMAL LOW (ref 98–111)
Creatinine, Ser: 1.64 mg/dL — ABNORMAL HIGH (ref 0.44–1.00)
GFR, Estimated: 31 mL/min — ABNORMAL LOW (ref >60)
Glucose, Bld: 95 mg/dL (ref 70–99)
Potassium: 4.3 mmol/L (ref 3.5–5.1)
Sodium: 127 mmol/L — ABNORMAL LOW (ref 135–145)

## 2024-03-01 LAB — CBC
HCT: 40.1 % (ref 36.0–46.0)
Hemoglobin: 13.1 g/dL (ref 12.0–15.0)
MCH: 27.4 pg (ref 26.0–34.0)
MCHC: 32.7 g/dL (ref 30.0–36.0)
MCV: 83.9 fL (ref 80.0–100.0)
Platelets: 760 10*3/uL — ABNORMAL HIGH (ref 150–400)
RBC: 4.78 MIL/uL (ref 3.87–5.11)
RDW: 14.5 % (ref 11.5–15.5)
WBC: 10.4 10*3/uL (ref 4.0–10.5)
nRBC: 0 % (ref 0.0–0.2)

## 2024-03-01 LAB — BRAIN NATRIURETIC PEPTIDE: B Natriuretic Peptide: 380.1 pg/mL — ABNORMAL HIGH (ref 0.0–100.0)

## 2024-03-01 MED ORDER — SPIRONOLACTONE 25 MG PO TABS: 12.5000 mg | ORAL_TABLET | Freq: Every day | ORAL | Status: DC

## 2024-03-01 NOTE — Patient Instructions (Signed)
 Medication Changes:  START: SPIRONOLACTONE 12.5 MG ONCE DAILY   Lab Work:  Labs done today, your results will be available in MyChart, we will contact you for abnormal readings.  THEN HAVE LABS AGAIN IN 1 WEEK AT FACILITY   Follow-Up in: AS NEEDED   At the [facility], you and your health needs are our priority. We have a designated team specialized in the treatment of Heart Failure. This Care Team includes your primary Heart Failure Specialized Cardiologist (physician), Advanced Practice Providers (APPs- Physician Assistants and Nurse Practitioners), and Pharmacist who all work together to provide you with the care you need, when you need it.   You may see any of the following providers on your designated Care Team at your next follow up:  Dr. Jules Oar Dr. Peder Bourdon Dr. Alwin Baars Dr. Judyth Nunnery Nieves Bars, NP Ruddy Corral, [place] [facility] [place], [place] Dennise Fitz, NP [place] Lee, NP Luster Salters, PharmD   Please be sure to bring in all your medications bottles to every appointment.   Need to Contact Us :  If you have any questions or concerns before your next appointment please send us  a message through Anthony or call our office at [phone].    TO LEAVE A MESSAGE FOR THE NURSE SELECT OPTION 2, PLEASE LEAVE A MESSAGE INCLUDING: YOUR NAME DATE OF BIRTH CALL BACK NUMBER REASON FOR CALL**this is important as we prioritize the call backs  YOU WILL RECEIVE A CALL BACK THE SAME DAY AS LONG AS YOU CALL BEFORE 4:00 PM

## 2024-03-03 NOTE — Progress Notes (Signed)
 Cardiology Office Note:  .   Date:  03/07/2024  ID:  [name] [name], DOB Nov 15, 1940, MRN [id number] PCP: Gaither Juba, MD  [facility] Providers Cardiologist:  Ralene Burger, MD    History of Present Illness: .   [name] [name] is a 83 y.o. female with a past medical history of CAD--type II non-STEMI in 2014, PAF, diastolic heart failure, first-degree AV block, PVCs, hypertension, history of UTI/hydronephrosis, right nephrectomy, thrombocythemia follows with Dr. Harles Lied, dyslipidemia.  02/12/2024 echo consistent with Takosubo cardiomyopathy, EF 25-30%, small pericardial effusion, trivial MR 08/27/2022 echo EF 60 to 65%, mild concentric LVH, grade 1 DD, aortic dilatation at 30 mm, mild dilatation of the ascending aorta 38 mm 11/20/2020 monitor average heart rate 70 bpm, brief atrial runs, occasional PACs  [name] [name] is a longstanding patient of Dr. Krasowski, primarily followed for paroxysmal atrial fibrillation.  Evaluated by Dr. Gordan Latina on 02/23/2023, she was maintaining sinus rhythm, she was noted to have frequent ventricular ectopy and her beta-blocker dose had been increased but this made her feel worse and was eventually decreased.  She had been evaluated by EP and they recommended increasing her dose of flecainide  100 mg twice daily and this seemed to improve her palpitations.  She was evaluated by myself in November 2024, she was maintaining sinus rhythm, she was actually bothered by abdominal pain that she felt was related to her gallbladder, we repeated lab work for her atrial fibrillation with plans to follow back up in 6 months.  She ended up suffering a fall on 02/11/2024, her blood pressure had apparently been running low at home with associated nausea fatigue, loose stools.  Upon arrival to the ED her WBCs were elevated at 23.7, troponins 1228, positive for UTI, she suffered a distal right tibia/fibula fracture.  She underwent ORIF that was overall uncomplicated.   Cardiology was consulted secondary to non-STEMI, echocardiogram was obtained revealing an EF of 25 to 30% consistent with Takotsubo cardiomyopathy felt to be stress-induced.  She had an episode of NSVT, her flecainide  was stopped and she was started on amiodarone .   She presents today accompanied by her husband for follow-up after recent hospitalization as outlined above.  She is currently wheelchair-bound secondary to her tib-fib fracture.  She is feeling okay, she was discharged to an SNF for rehab so his she has been residing there.  She just had lab work completed however we do not have the results of this but she was told that everything looked okay.  Was evaluated by the advanced heart failure TOC team, she was started on low-dose spironolactone . She denies chest pain, palpitations, dyspnea, pnd, orthopnea, n, v, dizziness, syncope, edema, weight gain, or early satiety.  ROS: Review of Systems  Constitutional: Negative.   HENT: Negative.    Eyes: Negative.   Respiratory: Negative.    Cardiovascular: Negative.   Musculoskeletal:  Positive for back pain and joint pain.  Skin: Negative.   Neurological: Negative.   Endo/Heme/Allergies: Negative.   Psychiatric/Behavioral: Negative.       Studies Reviewed: .        Cardiac Studies & Procedures   ______________________________________________________________________________________________     ECHOCARDIOGRAM  ECHOCARDIOGRAM COMPLETE 02/12/2024  Narrative ECHOCARDIOGRAM REPORT    Patient Name:   [name] [name] [facility] Date of Exam: 02/12/2024 Medical Rec #:  [id number]         Height:       67.0 in Accession #:    [id number]  Weight:       188.5 lb Date of Birth:  May 19, 1941         BSA:          1.972 m Patient Age:    82 years          BP:           102/56 mmHg Patient Gender: F                 HR:           78 bpm. Exam Location:  Inpatient  Procedure: 2D Echo, Intracardiac Opacification Agent, Cardiac Doppler and  Color Doppler (Both Spectral and Color Flow Doppler were utilized during procedure).  MODIFIED REPORT: This report was modified by Ola Berger MD on 02/12/2024 due to COmplete report. Indications:     Ventricular Tachycardia I47.2  History:         Patient has prior history of Echocardiogram examinations, most recent 08/27/2022. CHF, CAD; Risk Factors:Hypertension and Dyslipidemia.  Sonographer:     Gaston Karvonen, FE, PE Referring Phys:  [id number] Santana Cue OPYD Diagnosing Phys: Ola Berger MD  IMPRESSIONS   1. Akinesis of the distal lateral, distal septal, distal inferior, distal anterior and apical walls with aneurysmal dilitation of the distal inferior and apical walls. Wall motion consistent with possible Takosubo's cardiomyopathy vs CAD. Compared to echo report from 08/27/22, these findings are new . Aaron Aas Left ventricular ejection fraction, by estimation, is 25 to 30%. The left ventricle has severely decreased function. 2. Right ventricular systolic function is normal. The right ventricular size is normal. 3. A small pericardial effusion is present. 4. The mitral valve is normal in structure. Trivial mitral valve regurgitation. 5. The aortic valve is tricuspid. Aortic valve regurgitation is not visualized. 6. The inferior vena cava is normal in size with <50% respiratory variability, suggesting right atrial pressure of 8 mmHg.  FINDINGS Left Ventricle: Akinesis of the distal lateral, distal septal, distal inferior, distal anterior and apical walls with aneurysmal dilitation of the distal inferior and apical walls. Wall motion consistent with possible Takosubo's cardiomyopathy vs CAD. Compared to echo report from 08/27/22, these findings are new. Left ventricular ejection fraction, by estimation, is 25 to 30%. The left ventricle has severely decreased function. Definity  contrast agent was given IV to delineate the left ventricular endocardial borders. The left ventricular internal cavity  size was normal in size. There is no left ventricular hypertrophy.  Right Ventricle: The right ventricular size is normal. Right vetricular wall thickness was not assessed. Right ventricular systolic function is normal.  Left Atrium: Left atrial size was normal in size.  Right Atrium: Right atrial size was normal in size.  Pericardium: A small pericardial effusion is present.  Mitral Valve: The mitral valve is normal in structure. Trivial mitral valve regurgitation.  Tricuspid Valve: The tricuspid valve is normal in structure. Tricuspid valve regurgitation is trivial.  Aortic Valve: The aortic valve is tricuspid. Aortic valve regurgitation is not visualized. Aortic valve mean gradient measures 3.0 mmHg. Aortic valve peak gradient measures 5.9 mmHg. Aortic valve area, by VTI measures 2.90 cm.  Pulmonic Valve: The pulmonic valve was normal in structure. Pulmonic valve regurgitation is not visualized.  Aorta: The aortic root and ascending aorta are structurally normal, with no evidence of dilitation.  Venous: The inferior vena cava is normal in size with less than 50% respiratory variability, suggesting right atrial pressure of 8 mmHg.  IAS/Shunts: No atrial level shunt detected by color  flow Doppler.   LEFT VENTRICLE PLAX 2D LVIDd:         4.90 cm   Diastology LVIDs:         3.30 cm   LV e' medial:    5.87 cm/s LV PW:         0.90 cm   LV E/e' medial:  16.0 LV IVS:        1.00 cm   LV e' lateral:   8.27 cm/s LVOT diam:     2.20 cm   LV E/e' lateral: 11.3 LV SV:         71 LV SV Index:   36 LVOT Area:     3.80 cm   RIGHT VENTRICLE RV Basal diam:  3.20 cm RV Mid diam:    2.90 cm RV S prime:     8.38 cm/s TAPSE (M-mode): 1.8 cm  LEFT ATRIUM             Index        RIGHT ATRIUM           Index LA Vol (A2C):   48.9 ml 24.80 ml/m  RA Area:     16.50 cm LA Vol (A4C):   53.5 ml 27.13 ml/m  RA Volume:   43.60 ml  22.11 ml/m LA Biplane Vol: 50.9 ml 25.81 ml/m AORTIC  VALVE AV Area (Vmax):    2.81 cm AV Area (Vmean):   2.80 cm AV Area (VTI):     2.90 cm AV Vmax:           121.00 cm/s AV Vmean:          83.200 cm/s AV VTI:            0.244 m AV Peak Grad:      5.9 mmHg AV Mean Grad:      3.0 mmHg LVOT Vmax:         89.60 cm/s LVOT Vmean:        61.300 cm/s LVOT VTI:          0.186 m LVOT/AV VTI ratio: 0.76  AORTA Ao Root diam: 3.00 cm Ao Asc diam:  3.70 cm  MITRAL VALVE MV Area (PHT): 4.06 cm    SHUNTS MV Decel Time: 187 msec    Systemic VTI:  0.19 m MV E velocity: 93.80 cm/s  Systemic Diam: 2.20 cm MV A velocity: 87.50 cm/s MV E/A ratio:  1.07  Ola Berger MD Electronically signed by Ola Berger MD Signature Date/Time: 02/12/2024/4:35:17 PM    Final (Updated)    MONITORS  LONG TERM MONITOR-LIVE TELEMETRY (3-14 DAYS) 12/04/2020  Narrative Winthrop Hawks Kopper, DOB 07-Jan-1941, MRN [id number]  EVENT MONITOR REPORT:   Patient was monitored from 11/20/2020 to 11/27/2020. Indication:                    Paroxysmal atrial fibrillation Ordering physician: Dr. Ralene Burger MD Referring physician:  Dr. Ralene Burger MD   Baseline rhythm: Sinus  Minimum heart rate: 53 BPM.  Average heart rate: 70 BPM.  Maximal heart rate 108 BPM.  Atrial arrhythmia: Rare brief atrial runs.  Occasional PACs  Ventricular arrhythmia: Frequent PVCs ] 14.3%]  Conduction abnormality: None significant  Symptoms: Skipped beat and fluttering sensation   Conclusion: Mildly abnormal but overall unremarkable event monitor.  At times the patient had symptoms of palpitations and fluttering and did not correlate with any significant findings on the monitor.  At other times patient had PVCs and  ventricular bigeminy when she experienced the symptoms. Interpreting  cardiologist: Nelia Balzarine, MD Date: 12/04/2020 11:39 AM       ______________________________________________________________________________________________      Risk Assessment/Calculations:     CHA2DS2-VASc Score = 5   This indicates a 7.2% annual risk of stroke. The patient's score is based upon: CHF History: 1 HTN History: 1 Diabetes History: 0 Stroke History: 0 Vascular Disease History: 0 Age Score: 2 Gender Score: 1         Physical Exam:   VS:  BP 100/68   Pulse 73   Ht 5\' 7"  (1.702 m)   Wt 181 lb (82.1 kg) Comment: verbal  SpO2 97%   BMI 28.35 kg/m    Wt Readings from Last 3 Encounters:  03/04/24 181 lb (82.1 kg)  03/01/24 181 lb (82.1 kg)  02/20/24 195 lb 5.2 oz (88.6 kg)    GEN: Well nourished, well developed in no acute distress NECK: No JVD; No carotid bruits CARDIAC: RRR, no murmurs, rubs, gallops RESPIRATORY:  Clear to auscultation without rales, wheezing or rhonchi  ABDOMEN: Soft, non-tender, non-distended EXTREMITIES:  No edema; No deformity   ASSESSMENT AND PLAN: .   HFrEF-NYHA class II, she appears to be euvolemic.  Most recent echo EF 25% felt to be most consistent with stress cardiomyopathy so no cath was pursued.  Continue losartan  12.5 mg daily, continue Coreg  3.125 mg twice daily, she is not on SGLT2 with recent UTI.  She was started on low-dose spironolactone  12.5 mg a few days ago by [facility] heart failure team with plans for repeat Pete BMET at her facility next week.  There is very little room to titrate any of her medications any further secondary to hypotension.  CAD -she apparently had a type II STEMI back in 2014, there are no records to review review to determine if there was any stents placed etc. Stable with no anginal symptoms. No indication for ischemic evaluation.    Palpitations -currently quiescent.   PAF/hypercoagulable state/high risk medication use -she is maintaining sinus rhythm, CHA2DS2-VASc score of 5, continue Eliquis  5 mg twice daily--no indication for dose reduction, she denies hematochezia, hematuria, hemoptysis.  Her flecainide  was stopped during her hospitalization secondary to an episode of NSVT and she was  transition to amiodarone , continue 200 mg daily.  HTN -blood pressure is on the low normal side at 100/68.         Dispo: She is having labs at her facility in a few days, follow-up with Dr. Gordan Latina in 8 weeks.  Signed, Terrance Ferretti, NP

## 2024-03-04 ENCOUNTER — Ambulatory Visit: Attending: Cardiology | Admitting: Cardiology

## 2024-03-04 ENCOUNTER — Telehealth (HOSPITAL_COMMUNITY): Payer: Self-pay

## 2024-03-04 VITALS — BP 100/68 | HR 73 | Ht 67.0 in | Wt 181.0 lb

## 2024-03-04 DIAGNOSIS — I5020 Unspecified systolic (congestive) heart failure: Secondary | ICD-10-CM

## 2024-03-04 DIAGNOSIS — R002 Palpitations: Secondary | ICD-10-CM

## 2024-03-04 DIAGNOSIS — I480 Paroxysmal atrial fibrillation: Secondary | ICD-10-CM | POA: Diagnosis not present

## 2024-03-04 DIAGNOSIS — D6859 Other primary thrombophilia: Secondary | ICD-10-CM

## 2024-03-04 DIAGNOSIS — I5181 Takotsubo syndrome: Secondary | ICD-10-CM | POA: Diagnosis not present

## 2024-03-04 DIAGNOSIS — I2510 Atherosclerotic heart disease of native coronary artery without angina pectoris: Secondary | ICD-10-CM | POA: Diagnosis not present

## 2024-03-04 DIAGNOSIS — Z79899 Other long term (current) drug therapy: Secondary | ICD-10-CM

## 2024-03-04 NOTE — Patient Instructions (Signed)
 Medication Instructions:  Your physician recommends that you continue on your current medications as directed. Please refer to the Current Medication list given to you today.  *If you need a refill on your cardiac medications before your next appointment, please call your pharmacy*   Lab Work: None ordered If you have labs (blood work) drawn today and your tests are completely normal, you will receive your results only by: MyChart Message (if you have MyChart) OR A paper copy in the mail If you have any lab test that is abnormal or we need to change your treatment, we will call you to review the results.   Testing/Procedures: None ordered   Follow-Up: At [facility], you and your health needs are our priority.  As part of our continuing mission to provide you with exceptional heart care, we have created designated Provider Care Teams.  These Care Teams include your primary Cardiologist (physician) and Advanced Practice Providers (APPs -  Physician Assistants and Nurse Practitioners) who all work together to provide you with the care you need, when you need it.  We recommend signing up for the patient portal called "MyChart".  Sign up information is provided on this After Visit Summary.  MyChart is used to connect with patients for Virtual Visits (Telemedicine).  Patients are able to view lab/test results, encounter notes, upcoming appointments, etc.  Non-urgent messages can be sent to your provider as well.   To learn more about what you can do with MyChart, go to [email or web address].    Your next appointment:   8 week(s)  The format for your next appointment:   In Person  Provider:   Ralene Burger, MD    Other Instructions none  Important Information About Sugar

## 2024-03-04 NOTE — Telephone Encounter (Addendum)
 Results faxed to [facility] in [place] on [phone] she is in room 704  ----- Message from Elmarie Hacking sent at 03/01/2024  1:26 PM EDT ----- Renal function mildly elevated.  Sodium is very low. PCP needs to follow sodium levels.   She has repeat BMEt arranged, she is at [facility] in [place]

## 2024-03-08 DIAGNOSIS — S82301D Unspecified fracture of lower end of right tibia, subsequent encounter for closed fracture with routine healing: Secondary | ICD-10-CM | POA: Diagnosis not present

## 2024-03-08 DIAGNOSIS — S82831D Other fracture of upper and lower end of right fibula, subsequent encounter for closed fracture with routine healing: Secondary | ICD-10-CM | POA: Diagnosis not present

## 2024-03-11 ENCOUNTER — Telehealth (HOSPITAL_COMMUNITY): Payer: Self-pay

## 2024-03-11 NOTE — Telephone Encounter (Signed)
 Received labs that were done at [facility] facility on 5/13 that showed patients creatinine 1.57, and K of 5.9  Per Camilo Cella, NP stop spironolactone  and take 1/2 dose lokelma recheck labs (BMP) next week   Called and spoke with Nurse caring for patient at [facility]- according to nurse physician at facility stopped spiro and losartan , and gave patient 3 doses of lokelma- per nurse patients K is currently down to 4.6 patient to received last dose of lokelma today   Will forward to provider to make aware of plan per facility.

## 2024-03-29 DIAGNOSIS — N390 Urinary tract infection, site not specified: Secondary | ICD-10-CM | POA: Diagnosis not present

## 2024-03-29 DIAGNOSIS — I5032 Chronic diastolic (congestive) heart failure: Secondary | ICD-10-CM | POA: Diagnosis not present

## 2024-03-29 DIAGNOSIS — M84371D Stress fracture, right ankle, subsequent encounter for fracture with routine healing: Secondary | ICD-10-CM | POA: Diagnosis not present

## 2024-03-29 DIAGNOSIS — I4891 Unspecified atrial fibrillation: Secondary | ICD-10-CM | POA: Diagnosis not present

## 2024-04-05 ENCOUNTER — Encounter (HOSPITAL_COMMUNITY): Payer: Self-pay | Admitting: Student

## 2024-04-05 ENCOUNTER — Other Ambulatory Visit: Payer: Self-pay

## 2024-04-05 ENCOUNTER — Ambulatory Visit: Payer: Self-pay | Admitting: Student

## 2024-04-05 DIAGNOSIS — S82831D Other fracture of upper and lower end of right fibula, subsequent encounter for closed fracture with routine healing: Secondary | ICD-10-CM | POA: Diagnosis not present

## 2024-04-05 DIAGNOSIS — S82301D Unspecified fracture of lower end of right tibia, subsequent encounter for closed fracture with routine healing: Secondary | ICD-10-CM | POA: Diagnosis not present

## 2024-04-05 NOTE — Progress Notes (Signed)
 Reviewed history and medications with nurse, Herminia Lope at [facility] in [place].  Instructions faxed to [phone].   Herminia Lope can be reached at [phone] ext. [phone].     Per Dr. Curtiss Dowdy, Eliquis  is to be stopped on 6/10.   Tyler aware of these instructions.

## 2024-04-05 NOTE — Progress Notes (Signed)
 Surgical Instructions   Your procedure is scheduled on Thursday, June 12th, 2025. Report to [facility] Main Entrance "A" at 7:30 A.M., then check in with the Admitting office. Any questions or running late day of surgery: call [phone]  Questions prior to your surgery date: call [phone], Monday-Friday, 8am-4pm. If you experience any cold or flu symptoms such as cough, fever, chills, shortness of breath, etc. between now and your scheduled surgery, please notify us  at the above number.     Remember:  Do not eat after midnight the night before your surgery   You may drink clear liquids until 7:00 the morning of your surgery.   Clear liquids allowed are: Water , Non-Citrus Juices (apple juice or cranberry juice), Carbonated Beverages, Clear Tea (no milk, honey, etc.), Black Coffee Only (NO MILK, CREAM OR POWDERED CREAMER of any kind), and Gatorade.    Take these medicines the morning of surgery with A SIP OF WATER : Amiodarone  (Pacerone ) Carvedilol  (Coreg ) Omeprazole (Prilosec) Hydralazine  Zyvox    May take these medicines IF NEEDED: Acetaminophen  (Tylenol ) Albuterol  (Ventolin  HFA) inhaler Methocarbamol  (Robaxin ) Oxycodone  (Oxy IR/Roxicodone ) Ondansetron  (Zofran )   Per Dr. Curtiss Dowdy, Eliquis  should be stopped as of Tuesday, June 10th.     One week prior to surgery, STOP taking any Aspirin  (unless otherwise instructed by your surgeon) Aleve, Naproxen, Ibuprofen, Motrin, Advil, Goody's, BC's, all herbal medications, fish oil, and non-prescription vitamins.                     Do NOT Smoke (Tobacco/Vaping) for 24 hours prior to your procedure.  If you use a CPAP at night, you may bring your mask/headgear for your overnight stay.   You will be asked to remove any contacts, glasses, piercing's, hearing aid's, dentures/partials prior to surgery. Please bring cases for these items if needed.    Patients discharged the day of surgery will not be allowed to drive home, and  someone needs to stay with them for 24 hours.  SURGICAL WAITING ROOM VISITATION Patients may have no more than 2 support people in the waiting area - these visitors may rotate.   Pre-op nurse will coordinate an appropriate time for 1 ADULT support person, who may not rotate, to accompany patient in pre-op.  Children under the age of 39 must have an adult with them who is not the patient and must remain in the main waiting area with an adult.  If the patient needs to stay at the hospital during part of their recovery, the visitor guidelines for inpatient rooms apply.  Please refer to the [facility] website for the visitor guidelines for any additional information.   If you received a COVID test during your pre-op visit  it is requested that you wear a mask when out in public, stay away from anyone that may not be feeling well and notify your surgeon if you develop symptoms. If you have been in contact with anyone that has tested positive in the last 10 days please notify you surgeon.      Additional instructions for the day of surgery: DO NOT APPLY any lotions, deodorants, cologne, or perfumes.   Do not wear jewelry or makeup Do not wear nail polish, gel polish, artificial nails, or any other type of covering on natural nails (fingers and toes) Do not bring valuables to the hospital. [facility] is not responsible for valuables/personal belongings. Put on clean/comfortable clothes.  Please brush your teeth.  Ask your nurse before applying any prescription  medications to the skin.

## 2024-04-06 NOTE — H&P (Signed)
 [facility] (OTS) H&P  Patient ID: [name] MRN: [id number] DOB/AGE: 82-Sep-1942 82 y.o.  Reason for Surgery: Exposed HW right ankle  HPI: [name] [name] is a 83 y.o. female with medical history significant for CKD 3B, nephrolithiasis, hyperlipidemia, hypertension, chronic HFpEF, thrombocytosis, first-degree heart block, and PAF on Eliquis  presenting for surgery on the right ankle. Patient sustained at fall at home on 02/11/24, resulting in a right distal tibia/fibula fracture. She underwent ORIF of the right distal tibia with Dr. Curtiss Dowdy on 02/15/24. Patient was discharged to SNF on 02/20/24. She was seen in OTS clinic for post-operative follow up and sutures were removed on 03/08/24. The incision at that time appeared to be healing well. Unfortunately, while at SNF patient noted to have wound breakdown with associated drainage and warmth over the last week or so. She was started on Doxycycline by her provider there, but that has not seemed to improve her symptoms. When re-evaluated in OTS clinic on 04/05/24, patient noted to have exposed hardware on the medial aspect  of the ankle. She presents now for hardware removal with I&D of the ankle wound. Patient has been fairly limited in her mobility since surgery in April. She is on Eliquis , last dose 04/05/24.    Past Medical History:  Diagnosis Date   Abdominal aortic atherosclerosis (HCC) 05/07/2016   Noted on CT abd/pelvis   Acute renal failure (HCC) 06/04/2013   Acute respiratory failure with hypoxia (HCC) 06/04/2013   Anemia, unspecified 06/08/2013   Anticoagulant long-term use    ELIQUIS    Cancer (HCC)    uterine cancer    Chronic anticoagulation 08/04/2017   Chronic diastolic (congestive) heart failure (HCC)     pt denies    Chronic diastolic congestive heart failure (HCC) 05/31/2015   Chronic kidney disease 2014   arf on dialysis in icu   Complicated UTI (urinary tract infection) 03/31/2021   Complication of  anesthesia    2021- kidney removed at [facility] pt reports did not get enough med to wake her up and she could not talk and then recovered after given more of wake up med from anesthesia    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 Wayna Hails [facility] in [place])  pt uses pessary   Diarrhea 06/11/2013   Diverticulosis 05/07/2016   Noted on CT abd/pelvis   Dyslipidemia 05/31/2015   E coli bacteremia 06/08/2013   Encounter for monitoring flecainide  therapy 07/30/2016   Essential thrombocythemia (HCC) 03/04/2021   Female genital prolapse 04/15/2016   First degree heart block    General weakness    GERD (gastroesophageal reflux disease)    Hematuria    intermittently due to ureter right stent   Hiatal hernia    High risk medication use 04/15/2016   History of arteriovenostomy for renal dialysis [facility])    History of colon polyps    History of gastric polyp    History of kidney infection    12-31-2018  perinephrtic abscess  right side  s/p drains x2 01-10-2019,  05/ 2020 drains removed   History of kidney stones    History of peptic ulcer disease    History of septic shock    2011 (pt unaware) and 06-04-2013  w/ acute renal failure   History of uterine cancer    STAGE I  --  S/P TAH W/ BSO  (NO OTHER TX)   History  of ventilator dependency 2014   in setting of septic shock   Hydronephrosis 05/07/2016   Hyperlipemia 04/15/2016   Hyperlipidemia    Hypertension    Iron deficiency anemia hematology/ oncology--- dr Harles Lied at [facility] in [place]--- per lov note 08-07-2017  stabilized and felt to be more chronic anemia   01/ 2018  dx iron def. anemia  s/p  IV Iron infusion and taking oral iron supplement   Lactic acidosis 06/04/2013   Lumbar compression fracture (HCC)    01-22-2019 per imaging L1  (06-01-2019 per pt currently no pain)   Midline cystocele 04/15/2016   Nonfunctioning  kidney 03/23/2020   NSTEMI, initial episode of care [facility]) 06/07/2013   pt denies    Occlusion of ureteral stent (HCC)    PAF (paroxysmal atrial fibrillation) (HCC) DX JUNE 2012   CARDIOLOGIST--  DR Parks Bollman--- [place])  CHADS2   Palpitations 04/15/2016   Paroxysmal atrial fibrillation (HCC) 08/04/2017   Presence of pessary    Pyelonephritis 06/04/2013   Pyelonephritis of left kidney 06/04/2013   Right wrist fracture    per pt fractured on 03-10-2019,  had closed reduction and wearing a brace   Sepsis (HCC) 05/07/2016   Septic shock (HCC) 06/04/2013   IMO SNOMED Dx Update Oct 2024     Septic shock(785.52) 06/04/2013   Tendonitis, Achilles 04/15/2016   Ureteral obstruction, right 06/04/2013   Ureteral stricture, right urologist-- dr Secundino Dach   Chronic--- treated with ureteral stent   Urinary tract infection without hematuria    Uses walker    and wheelchair for longer distance   Ventricular ectopy 12/28/2020   Wears glasses    Past Surgical History:  Procedure Laterality Date   APPENDECTOMY     BACK SURGERY     CARDIOVERSION  09/ 2018   dr Gordan Latina   successful ([facility] )   CHOLECYSTECTOMY  2010   COLONOSCOPY W/ POLYPECTOMY  05/2017   CYSTO/  BALLOON DILATION RIGHT URETERAL STRICTURE/ STENT PLACEMENT  06-02-2013   CYSTO/  BILATERAL RETROGRADE PYELOGRAM/ RIGHT URETEROSCOPY AND STENT PLACEMENT  10-04-2005   CYSTO/ LEFT URETEROSCOPIC STONE EXTRACTION  04-03-2006   CYSTOSCOPY W/ RETROGRADES Right 01/03/2019   Procedure: CYSTOSCOPY WITH RETROGRADE PYELOGRAM RIGHT WITH STENT EXCHANGE;  Surgeon: Homero Luster, MD;  Location: [facility];  Service: Urology;  Laterality: Right;   CYSTOSCOPY W/ URETERAL STENT PLACEMENT Right 02/06/2014   Procedure: CYSTOSCOPY WITH RIGHT RETROGRADE PYELOGRAM RIGHT STENT REMOVAL and REPLACEMENT, Right Ureteroscopy;  Surgeon: Edmund Gouge, MD;  Location: [facility];  Service: Urology;  Laterality: Right;    CYSTOSCOPY W/ URETERAL STENT PLACEMENT Right 11/26/2016   Procedure: CYSTOSCOPY WITH RETROGRADE PYELOGRAM/URETERAL STENT REPLACEMENT;  Surgeon: Osborn Blaze, MD;  Location[facility];  Service: Urology;  Laterality: Right;   CYSTOSCOPY W/ URETERAL STENT PLACEMENT Right 04/24/2017   Procedure: CYSTOSCOPY WITH RETROGRADE PYELOGRAM/URETERAL STENT REPLACEMENT;  Surgeon: Osborn Blaze, MD;  Location: [facility];  Service: Urology;  Laterality: Right;   CYSTOSCOPY W/ URETERAL STENT PLACEMENT Right 10/07/2017   Procedure: CYSTOSCOPY WITH RETROGRADE PYELOGRAM/URETERAL STENT EXCHANGE;  Surgeon: Osborn Blaze, MD;  Location[facility];  Service: Urology;  Laterality: Right;   CYSTOSCOPY W/ URETERAL STENT PLACEMENT Right 03/03/2018   Procedure: CYSTOSCOPY WITH RIGHT RETROGRADE / RIGHT URETERAL STENT EXCHANGE;  Surgeon: Osborn Blaze, MD;  Location[facility];  Service: Urology;  Laterality: Right;   CYSTOSCOPY W/ URETERAL STENT PLACEMENT Right 09/22/2018   Procedure:  CYSTOSCOPY WITH RETROGRADE PYELOGRAM/URETERAL STENT PLACEMENT;  Surgeon: Osborn Blaze, MD;  Location: [facility];  Service: Urology;  Laterality: Right;  45 MINS   CYSTOSCOPY W/ URETERAL STENT PLACEMENT Right 06/03/2019   Procedure: CYSTOSCOPY WITH RETROGRADE PYELOGRAM/URETERAL STENT REPLACEMENT;  Surgeon: Osborn Blaze, MD;  Location: [facility];  Service: Urology;  Laterality: Right;   CYSTOSCOPY W/ URETERAL STENT PLACEMENT Right 11/23/2019   Procedure: CYSTOSCOPY WITH RETROGRADE PYELOGRAM/URETERAL STENT PLACEMENT;  Surgeon: Osborn Blaze, MD;  Location: [facility];  Service: Urology;  Laterality: Right;  45 MINS   CYSTOSCOPY WITH LITHOLAPAXY N/A 11/21/2013   Procedure: CYSTOSCOPY WITH LITHOLAPAXY;  Surgeon: Edmund Gouge, MD;  Location: [facility];  Service: Urology;  Laterality: N/A;   CYSTOSCOPY WITH RETROGRADE  PYELOGRAM, URETEROSCOPY AND STENT PLACEMENT Bilateral 03/15/2014   Procedure: CYSTOSCOPY WITH BILATERAL RETROGRADE PYELOGRAM, RIGHT DIAGNOSTIC URETEROSCOPY AND STENT EXCHANGE;  Surgeon: Osborn Blaze, MD;  [facility];  Service: Urology;  Laterality: Bilateral;   CYSTOSCOPY WITH RETROGRADE PYELOGRAM, URETEROSCOPY AND STENT PLACEMENT Left 03/13/2021   Procedure: FIRST STAGE: CYSTOSCOPY WITH RETROGRADE PYELOGRAM, URETEROSCOPY AND STENT PLACEMENT;  Surgeon: Osborn Blaze, MD;  Location[facility];  Service: Urology;  Laterality: Left;  75 MINS   CYSTOSCOPY WITH RETROGRADE PYELOGRAM, URETEROSCOPY AND STENT PLACEMENT Left 03/29/2021   Procedure: SECOND STGAE:CYSTOSCOPY WITH RETROGRADE PYELOGRAM, URETEROSCOPY AND STENT EXCHANGE;  Surgeon: Osborn Blaze, MD;  Location[facility];  Service: Urology;  Laterality: Left;   CYSTOSCOPY WITH STENT PLACEMENT Right 05/07/2016   Procedure: CYSTOSCOPY WITH RETROGRADE PYELOGRAM, URETERAL STENT PLACEMENT;  Surgeon: Trent Frizzle, MD;  Location[facility];  Service: Urology;  Laterality: Right;   EYE SURGERY     Bilateral cataract removal   HEMIARTHROPLASTY HIP Right 11/2016   fractured   HOLMIUM LASER APPLICATION Left 03/13/2021   Procedure: HOLMIUM LASER APPLICATION;  Surgeon: Osborn Blaze, MD;  Location[facility];  Service: Urology;  Laterality: Left;   IR CATHETER TUBE CHANGE  02/04/2019   IR CATHETER TUBE CHANGE  02/04/2019   IR CATHETER TUBE CHANGE  02/11/2019   IR RADIOLOGIST EVAL & MGMT  03/09/2019   JOINT REPLACEMENT     LUMBAR FUSION  JULY 2013   NEPHROLITHOTOMY  X2 YRS AGO   OPEN REDUCTION INTERNAL FIXATION (ORIF) TIBIA/FIBULA FRACTURE Right 02/15/2024   Procedure: OPEN REDUCTION INTERNAL FIXATION (ORIF) TIBIA/FIBULA FRACTURE;  Surgeon: Laneta Pintos, MD;  Location: [facility];  Service: Orthopedics;  Laterality: Right;   PERCUTANEOUS NEPHROSTOMY  12/2018   ROBOT ASSISTED LAPAROSCOPIC NEPHRECTOMY Right 03/23/2020   Procedure: XI ROBOTIC ASSISTED LAPAROSCOPIC  NEPHRECTOMY;  Surgeon: Osborn Blaze, MD;  Location: [facility];  Service: Urology;  Laterality: Right;   TOTAL ABDOMINAL HYSTERECTOMY W/ BILATERAL SALPINGOOPHORECTOMY  1989   TOTAL KNEE ARTHROPLASTY Bilateral 1992  &  1996   TRANSTHORACIC ECHOCARDIOGRAM  07-24-2017   dr Gordan Latina   ef 60-65% (improved from last echo 2014, was 45-50%)/  trace TR/  mild AV sclerosis without stenosis   URETEROSCOPY Right 11/21/2013   Procedure: URETEROSCOPY WITH STENT REMOVAL AND REPLACEMENT;  Surgeon: Edmund Gouge, MD;  Location: [facility];  Service: Urology;  Laterality: Right;   Family History  Problem Relation Age of Onset   Heart disease Mother    Heart failure Mother    Cancer Mother    Heart disease Father    Heart failure Father     Social History:  reports that she has never smoked. She has never  used smokeless tobacco. She reports that she does not drink alcohol and does not use drugs.  Allergies:  Allergies  Allergen Reactions   Codeine Nausea Only and Other (See Comments)    hallucinations   Hydrocodone  Nausea Only and Other (See Comments)    Medications: Prior to Admission medications   Medication Sig Start Date End Date Taking? Authorizing Provider  acetaminophen  (TYLENOL ) 500 MG tablet Take 1,000 mg by mouth every 6 (six) hours as needed for moderate pain or headache.    Yes [provider]  albuterol  (VENTOLIN  HFA) 108 (90 Base) MCG/ACT inhaler Inhale 2 puffs into the lungs every 4 (four) hours as needed for wheezing or shortness of breath. 01/11/24  Yes Guss Legacy, FNP  aluminum-magnesium  hydroxide-simethicone (MAALOX) 200-200-20 MG/5ML SUSP Take 30 mLs by mouth every 4 (four) hours as needed (Indigestion Notify MD of no relief after 12 hours).   Yes [provider]  amiodarone  (PACERONE ) 200 MG tablet Take 1 tablet (200 mg total) by mouth daily. 02/20/24  Yes [place], Eric J, DO  amitriptyline  (ELAVIL ) 10 MG tablet Take 10 mg by mouth at  bedtime.   Yes [provider]  apixaban  (ELIQUIS ) 2.5 MG TABS tablet Take 1 tablet (2.5 mg total) by mouth 2 (two) times daily. 01/12/24  Yes Krasowski, Robert J, MD  carvedilol  (COREG ) 3.125 MG tablet Take 1 tablet (3.125 mg total) by mouth 2 (two) times daily with a meal. 02/20/24  Yes [place], Eric J, DO  Cholecalciferol  1.25 MG (50000 UT) capsule Take 1 capsule (50,000 Units total) by mouth once a week for 8 doses. Patient taking differently: Take 1,000 Units by mouth daily. 02/20/24 04/10/24 Yes [place], Eric J, DO  CRANBERRY-VITAMIN C-INULIN PO Take 30 mLs by mouth 2 (two) times daily.   Yes [provider]  feeding supplement (BOOST / RESOURCE BREEZE) LIQD Take 1 Container by mouth daily.   Yes [provider]  fosfomycin (MONUROL) 3 g PACK Take 3 g by mouth every Thursday.   Yes [provider]  furosemide  (LASIX ) 20 MG tablet Take 20 mg by mouth daily as needed (Weight gain of 3LB or Greater).   Yes [provider]  hydrALAZINE (APRESOLINE) 25 MG tablet Take 25 mg by mouth 2 (two) times daily.   Yes [provider]  hydrocortisone  (ANUSOL -HC) 25 MG suppository Place 25 mg rectally every 8 (eight) hours as needed for hemorrhoids or anal itching.   Yes [provider]  hydroxyurea  (HYDREA ) 500 MG capsule TAKE ONE CAPSULE BY MOUTH EVERY MONDAY, WEDNESDAY, AND FRIDAY FOR ELEVATED PLATELETS 12/28/23  Yes Mosher, Kelli A, PA-C  linezolid  (ZYVOX ) 600 MG tablet Take 600 mg by mouth 2 (two) times daily. 04/01/24  Yes [provider]  loperamide  (IMODIUM  A-D) 2 MG tablet Take 4 mg by mouth every 6 (six) hours as needed for diarrhea or loose stools.   Yes [provider]  methocarbamol  (ROBAXIN ) 500 MG tablet Take 1 tablet (500 mg total) by mouth every 8 (eight) hours as needed for muscle spasms. 02/18/24  Yes Letishia Elliott A, PA-C  mirtazapine (REMERON) 15 MG tablet Take 15 mg by mouth at bedtime.   Yes [provider]   omeprazole (PRILOSEC) 40 MG capsule Take 40 mg by mouth daily.   Yes [provider]  ondansetron  (ZOFRAN -ODT) 4 MG disintegrating tablet Take 4 mg by mouth every 6 (six) hours as needed for nausea/vomiting. 11/16/18  Yes [provider]  OVER THE COUNTER MEDICATION daily  as needed. Magic cup/Boost pudding or   Yes [provider]  oxyCODONE  (OXY IR/ROXICODONE ) 5 MG immediate release tablet Take 0.5-1 tablets (2.5-5 mg total) by mouth every 4 (four) hours as needed (2.5 mg pain socre 4-6, 5 mg pain score 7-10). 02/18/24  Yes Yachet Mattson A, PA-C  polyethylene glycol (MIRALAX  / GLYCOLAX ) 17 g packet Take 17 g by mouth daily as needed for mild constipation. 02/20/24  Yes [place], Eric J, DO  senna-docusate (SENOKOT-S) 8.6-50 MG tablet Take 1 tablet by mouth 2 (two) times daily. Patient taking differently: Take 2 tablets by mouth daily as needed for moderate constipation or mild constipation. 02/20/24  Yes [place], Rema Care, DO  triamcinolone  cream (KENALOG ) 0.1 % Apply 1 application  topically every 12 (twelve) hours as needed (eczema).   Yes [provider]  losartan  (COZAAR ) 25 MG tablet Take 0.5 tablets (12.5 mg total) by mouth daily. Patient not taking: Reported on 04/06/2024 02/21/24   [place], Rema Care, DO  Spacer/Aero-Holding Chambers (COMPACT SPACE CHAMBER) DEVI Use with the albuterol  inhaler 01/11/24   Guss Legacy, FNP   I have reviewed the patient's current medications.  Positive ROS: All other systems have been reviewed and were otherwise negative with the exception of those mentioned in the HPI and as above.  Exam: There were no vitals taken for this visit. General: Alert and oriented, no acute distress Cardiovascular: No pedal edema Respiratory: No cyanosis, no use of accessory musculature GI: No organomegaly, abdomen is soft and non-tender Skin: No lesions in the area of chief complaint Neurologic: Sensation intact distally Psychiatric: Patient is  competent for consent with normal mood and affect  Musculoskeletal: RLE: Open wound with exposed hardware medial ankle. Serosanguinous drainage. No purulence noted. No other wounds appreciated. Tolerates gentle ankle motion. +DP pulse.   LLE: Skin without lesions. No tenderness to palpation. Full painless ROM, full strength in each muscle group without evidence of instability. Motor/sensory function at baseline. Neurovascularly intact.   Medical Decision Making: Data: Imaging: AP, lateral, mortise view of the right ankle shows stable appearence to fracture.   Labs: No results found for this or any previous visit (from the past 24 hours).   Medical history and chart was reviewed and case discussed with attending provider.  Assessment/Plan: 83 year old female s/p ORIF right distal tibia fracture on 02/15/24, presenting with exposed hardware.   Patient fortunately has had wound breakdown and now has exposed hardware on the medial aspect of the ankle.  Antibiotics alone will not resolve this issue.  At this point, I would recommend proceeding with formal irrigation and debridement of the right ankle wound and hardware removal. Risks and benefits of the procedure have been discussed with patient and her family. Patient agrees to proceed with surgery. Consent will be obtained. We will admit the patient to the hospital post-operatively for therapies and antibiotics.   [name] Gordon PA-C [facility] [phone] (office) [email or web address]

## 2024-04-06 NOTE — Anesthesia Preprocedure Evaluation (Addendum)
 Anesthesia Evaluation  Patient identified by MRN, date of birth, ID band Patient awake    Reviewed: Allergy & Precautions, NPO status , Patient's Chart, lab work & pertinent test results  History of Anesthesia Complications Negative for: history of anesthetic complications  Airway Mallampati: II  TM Distance: >3 FB Neck ROM: Full    Dental no notable dental hx. (+) Dental Advisory Given, Teeth Intact   Pulmonary neg pulmonary ROS   Pulmonary exam normal breath sounds clear to auscultation       Cardiovascular hypertension, Pt. on medications + CAD, + Past MI and +CHF  Normal cardiovascular exam+ dysrhythmias Atrial Fibrillation and Ventricular Tachycardia  Rhythm:Regular Rate:Normal   '25 TTE - Akinesis of the distal lateral, distal septal, distal inferior, distal anterior and apical walls with aneurysmal dilitation of the distal inferior and apical walls. Wall motion consistent with possible Takosubo's cardiomyopathy vs CAD. Compared to echo report from 08/27/22, these findings are new. EF 25 to 30%. A small pericardial effusion is present. Trivial mitral valve regurgitation.     Neuro/Psych negative neurological ROS  negative psych ROS   GI/Hepatic Neg liver ROS, hiatal hernia,GERD  Medicated and Controlled,,  Endo/Other   Na 130 Obesity   Renal/GU CRFRenal disease     Musculoskeletal negative musculoskeletal ROS (+)    Abdominal   Peds  Hematology Lab Results      Component                Value               Date                      WBC                      10.4                03/01/2024                HGB                      13.1                03/01/2024                HCT                      40.1                03/01/2024                MCV                      83.9                03/01/2024                PLT                      760 (H)             03/01/2024            On eliquis     Anesthesia Other  Findings All: codeine, hyrdocodone  Reproductive/Obstetrics  Uterine cancer  Anesthesia Physical Anesthesia Plan  ASA: 3  Anesthesia Plan: Regional and MAC   Post-op Pain Management: Tylenol  PO (pre-op)*   Induction: Intravenous  PONV Risk Score and Plan: 3 and Treatment may vary due to age or medical condition, Ondansetron  and Propofol  infusion  Airway Management Planned: Nasal Cannula and Natural Airway  Additional Equipment:   Intra-op Plan:   Post-operative Plan:   Informed Consent:      Dental advisory given  Plan Discussed with: CRNA  Anesthesia Plan Comments: (PAT note written 04/06/2024 by Ella Gun, PA-C. SAME DAY WORK-UP  History includes never smoker, HTN, CAD (type II NTEMI 05/2013 in setting of E. Coli pyelonephritis/septic shock with acute renal failure requiring CRRT; NSTEMI likely secondary to Takotsubo cardiomyopathy 01/2024 in setting of fall/trauma and UTI), PAF, chronic diastolic CHF, first degree AV block, PVCs, righty robotic radical nephrectomy (03/23/20 for chronic pyelonephritis with stricture, atrophhic kidney), uterine caner (stage 1, s/p TAH/BSO), hiatal hernia, GERD, nephrolithiasis (with recurrent pyelonephritis, s/p multiple procedures), fall with right tibia/fibula fracture (02/11/24, s/p ORIF 02/15/24), essential thrombocythemia, IDA.  For anesthesia complications:  - Pre Preprocedure Assessment (Date of Service 03/20/20):  h/o difficult intubation for sepsis in [place] ED in 2014; intubation note for cysto in 2017 shows 1 attempt with glidescope; several procedures done successfully with LMA)  MAC and 4 used to intubated, 7.0 mm ETT, 1 attempt 03/23/20. - Per Postprocedure Assessment on 03/23/20 (post-right nephrectomy):  Comments: Residual neuromuscular blockade - given additional 200 mg sugammadex  with immediate improvement. Did not require respiratory support or reintubation.  Recent  [place] admission 02/11/24 - 02/20/24 for mechanical fall with right ankle fracture. She had felt poorly 3-4 days prior to left flank pain and rigors, N/V and low BP. In the ED Creatinine 3.10, WBC 23.7, HS troponin 1128.  She had runs of NSVT in the ED that were treated with amiodarone . Ortho and cardiology consulted. Ortho put off surgery for a few day to allow for additional cardiac testing and to allow time for AKI and UTI to be addressed. She received IVF and IV ceftrizxone. 02/12/24 TTE showed LVEF 25-30%, akinesis of the distal lateral, distal septal, distal inferior, distal anterior and apical walls with aneurysmal dilitation of the distal inferior and apical walls with wall motion consistent with possible Takosubo's cardiomyopathy vs CAD. Cardiology suspected stress-induced CM (elevated troponin levels felt out of proportion to degree of LV dysfunction; if she had true LAD infarct then cardiology would have suspected troponins > 24,000). She was started on GDMT. S/p ORIF of right ankle fracture on 02/15/24. Discharged to [facility]. Discharge medications included Coreg , amiodarone , losartan , Eliquis . Repeat echo in 3 months planned.    She had had HF and primary cardiology follow-up since discharged. Seen by Wynell Heath, NP on 03/01/24 and Pattricia Bores, NP on 03/04/24. Since stress cardiomyopathy suspected, cath was not pursued. 03/01/24 EKG with diffuse TWI felt consistent with Takotsubo. Sironolactone 12.5 mg daily added to her regimen, but overall appeared euvolemic. Consider cardiac rehab after she is discharged from SNF. Spironolactone  and losartan  held per SNF due to elevated Cr and K on 03/08/24. )        Anesthesia Quick Evaluation

## 2024-04-06 NOTE — Progress Notes (Signed)
 Anesthesia Chart Review:  Case: [id number] Date/Time: 04/07/24 0945   Procedures:      IRRIGATION AND DEBRIDEMENT WOUND (Right)     REMOVAL, HARDWARE (Right)   Anesthesia type: General   Pre-op diagnosis: Right exposed ankle hardware   [facility]: [facility]OM 03 / [facility]   Surgeons: Laneta Pintos, MD       DISCUSSION: Patient is an 83 year old female scheduled for the above procedure.  History includes never smoker, HTN, CAD (type II NTEMI 05/2013 in setting of E. Coli pyelonephritis/septic shock with acute renal failure requiring CRRT; NSTEMI likely secondary to Takotsubo cardiomyopathy 01/2024 in setting of fall/trauma and UTI), PAF, chronic diastolic CHF, first degree AV block, PVCs, righty robotic radical nephrectomy (03/23/20 for chronic pyelonephritis with stricture, atrophhic kidney), uterine caner (stage 1, s/p TAH/BSO), hiatal hernia, GERD, nephrolithiasis (with recurrent pyelonephritis, s/p multiple procedures), fall with right tibia/fibula fracture (02/11/24, s/p ORIF 02/15/24), essential thrombocythemia, IDA.  For anesthesia complications:  - Pre Preprocedure Assessment (Date of Service 03/20/20):  h/o difficult intubation for sepsis in [place] ED in 2014; intubation note for cysto in 2017 shows 1 attempt with glidescope; several procedures done successfully with LMA)  MAC and 4 used to intubated, 7.0 mm ETT, 1 attempt 03/23/20. - Per Postprocedure Assessment on 03/23/20 (post-right nephrectomy):  Comments: Residual neuromuscular blockade - given additional 200 mg sugammadex  with immediate improvement. Did not require respiratory support or reintubation.  Recent  admission 02/11/24 - 02/20/24 for mechanical fall with right ankle fracture. She had felt poorly 3-4 days prior to left flank pain and rigors, N/V and low BP. In the ED Creatinine 3.10, WBC 23.7, HS troponin 1128.  She had runs of NSVT in the ED that were treated with amiodarone . Ortho and cardiology consulted. Ortho put  off surgery for a few day to allow for additional cardiac testing and to allow time for AKI and UTI to be addressed. She received IVF and IV ceftrizxone. 02/12/24 TTE showed LVEF 25-30%, akinesis of the distal lateral, distal septal, distal inferior, distal anterior and apical walls with aneurysmal dilitation of the distal inferior and apical walls with wall motion consistent with possible Takosubo's cardiomyopathy vs CAD. Cardiology suspected stress-induced CM (elevated troponin levels felt out of proportion to degree of LV dysfunction; if she had true LAD infarct then cardiology would have suspected troponins > 24,000). She was started on GDMT. S/p ORIF of right ankle fracture on 02/15/24. Discharged to [facility]. Discharge medications included Coreg , amiodarone , losartan , Eliquis . Repeat echo in 3 months planned.    She had had HF and primary cardiology follow-up since discharged. Seen by Wynell Heath, NP on 03/01/24 and Pattricia Bores, NP on 03/04/24. Since stress cardiomyopathy suspected, cath was not pursued. 03/01/24 EKG with diffuse TWI felt consistent with Takotsubo. Sironolactone 12.5 mg daily added to her regimen, but overall appeared euvolemic. Consider cardiac rehab after she is discharged from SNF.   By 03/11/24 notation, SNF physician held spironolactone  and losartan  and give Lokelma after 03/08/24 labs showed Creatinine 1.57 and K 5.9. Dr. Curtiss Dowdy advised holding Eliquis  starting on 04/05/24.  She is a same day work-up. Extensive cardiac history but with recent primary and HF evaluations. She apparently now has exposed right ankle hardware. She will need updated labs on arrival as indicated. Anesthesia team to further assess on the day of surgery.    VS:  BP Readings from Last 3 Encounters:  03/04/24 100/68  03/01/24 116/64  02/20/24 (!) 123/59   Pulse Readings  from Last 3 Encounters:  03/04/24 73  03/01/24 83  02/20/24 68     PROVIDERS: Gaither Juba, MD is PCP  Ralene Burger, MD is cardiologist Ciro Cress, MD is HEM   LABS: For day of surgery as indicated. Last results in [facility] include: Lab Results  Component Value Date   WBC 10.4 03/01/2024   HGB 13.1 03/01/2024   HCT 40.1 03/01/2024   PLT 760 (H) 03/01/2024   GLUCOSE 95 03/01/2024   CHOL 179 08/04/2022   TRIG 157 (H) 08/04/2022   HDL 53 08/04/2022   LDLCALC 99 08/04/2022   ALT 6 02/17/2024   AST 17 02/17/2024   NA 127 (L) 03/01/2024   K 4.3 03/01/2024   CL 92 (L) 03/01/2024   CREATININE 1.64 (H) 03/01/2024   BUN 14 03/01/2024   CO2 23 03/01/2024   TSH 1.862 02/11/2024   INR 1.8 (H) 02/11/2024      IMAGES: CT Head/C-spine 02/11/24: IMPRESSION: 1. No acute intracranial abnormality. 2. No acute fracture in the cervical spine.   1V CXR 02/11/24: IMPRESSION: 1. Low lung volumes with bronchovascular crowding. Left basilar patchy opacities, likely atelectasis. 2. Questionable nondisplaced left lateral fifth rib fracture. Recommend correlation with point tenderness.    EKG: EKG 03/01/24 ([facility]): Sinus rhythm with 1st degree A-V block T wave abnormality, consider inferior ischemia T wave abnormality, consider anterolateral ischemia Prolonged QT When compared with ECG of 12-Feb-2024 14:46, Significant changes have occurred with deep anterior T wave inversion and prolonged QT Confirmed by Eilleen Grates ([id number]) on 03/01/2024 5:15:45 PM  EKG 02/12/24: Normal sinus rhythm Left axis deviation Low voltage QRS Possible Anterolateral infarct , age undetermined Abnormal ECG When compared with ECG of 11-Feb-2024 23:57, PREVIOUS ECG IS PRESENT Confirmed by Sheryle Donning [phone]) on 02/14/2024 11:11:07 PM   CV: Echo 02/12/24:  IMPRESSIONS   1. Akinesis of the distal lateral, distal septal, distal inferior, distal  anterior and apical walls with aneurysmal dilitation of the distal  inferior and apical walls. Wall motion consistent with possible Takosubo's   cardiomyopathy vs CAD. Compared to  echo report from 08/27/22, these findings are new . Aaron Aas Left ventricular  ejection fraction, by estimation, is 25 to 30%. The left ventricle has  severely decreased function.   2. Right ventricular systolic function is normal. The right ventricular  size is normal.   3. A small pericardial effusion is present.   4. The mitral valve is normal in structure. Trivial mitral valve  regurgitation.   5. The aortic valve is tricuspid. Aortic valve regurgitation is not  visualized.   6. The inferior vena cava is normal in size with <50% respiratory  variability, suggesting right atrial pressure of 8 mmHg.      Event Monitor 11/20/20 - 11/27/20:. Conclusion:  Mildly abnormal but overall unremarkable event monitor.  At times the patient had symptoms of palpitations and fluttering and did not correlate with any significant findings on the monitor.  At other times patient had PVCs and ventricular bigeminy when she experienced the symptoms. Interpreting  cardiologist: Nelia Balzarine, MD  Date: 12/04/2020 11:39 AM     Past Medical History:  Diagnosis Date   Abdominal aortic atherosclerosis (HCC) 05/07/2016   Noted on CT abd/pelvis   Acute renal failure (HCC) 06/04/2013   Acute respiratory failure with hypoxia (HCC) 06/04/2013   Anemia, unspecified 06/08/2013   Anticoagulant long-term use    ELIQUIS    Cancer (HCC)    uterine cancer    Chronic  anticoagulation 08/04/2017   Chronic diastolic (congestive) heart failure (HCC)     pt denies    Chronic diastolic congestive heart failure (HCC) 05/31/2015   Chronic kidney disease 2014   arf on dialysis in icu   Complicated UTI (urinary tract infection) 03/31/2021   Complication of anesthesia    2021- kidney removed at [facility] pt reports did not get enough med to wake her up and she could not talk and then recovered after given more of wake up med from anesthesia    Congenital vaginal enterocele 04/15/2016    Coronary artery disease    Cystocele, midline    followed by dr c. Wayna Hails [facility] in [place])  pt uses pessary   Diarrhea 06/11/2013   Diverticulosis 05/07/2016   Noted on CT abd/pelvis   Dyslipidemia 05/31/2015   E coli bacteremia 06/08/2013   Encounter for monitoring flecainide  therapy 07/30/2016   Essential thrombocythemia (HCC) 03/04/2021   Female genital prolapse 04/15/2016   First degree heart block    General weakness    GERD (gastroesophageal reflux disease)    Hematuria    intermittently due to ureter right stent   Hiatal hernia    High risk medication use 04/15/2016   History of arteriovenostomy for renal dialysis [facility])    History of colon polyps    History of gastric polyp    History of kidney infection    12-31-2018  perinephrtic abscess  right side  s/p drains x2 01-10-2019,  05/ 2020 drains removed   History of kidney stones    History of peptic ulcer disease    History of septic shock    2011 (pt unaware) and 06-04-2013  w/ acute renal failure   History of uterine cancer    STAGE I  --  S/P TAH W/ BSO  (NO OTHER TX)   History of ventilator dependency 2014   in setting of septic shock   Hydronephrosis 05/07/2016   Hyperlipemia 04/15/2016   Hyperlipidemia    Hypertension    Iron deficiency anemia hematology/ oncology--- dr Harles Lied at [facility] in [place]--- per lov note 08-07-2017  stabilized and felt to be more chronic anemia   01/ 2018  dx iron def. anemia  s/p  IV Iron infusion and taking oral iron supplement   Lactic acidosis 06/04/2013   Lumbar compression fracture (HCC)    01-22-2019 per imaging L1  (06-01-2019 per pt currently no pain)   Midline cystocele 04/15/2016   Nonfunctioning kidney 03/23/2020   NSTEMI, initial episode of care [facility]) 06/07/2013   pt denies    Occlusion of ureteral stent (HCC)    PAF (paroxysmal atrial fibrillation) (HCC) DX JUNE 2012   CARDIOLOGIST--  DR Parks Bollman--- [place]  [place] [facility])  CHADS2   Palpitations 04/15/2016   Paroxysmal atrial fibrillation (HCC) 08/04/2017   Presence of pessary    Pyelonephritis 06/04/2013   Pyelonephritis of left kidney 06/04/2013   Right wrist fracture    per pt fractured on 03-10-2019,  had closed reduction and wearing a brace   Sepsis (HCC) 05/07/2016   Septic shock (HCC) 06/04/2013   IMO SNOMED Dx Update Oct 2024     Septic shock(785.52) 06/04/2013   Tendonitis, Achilles 04/15/2016   Ureteral obstruction, right 06/04/2013   Ureteral stricture, right urologist-- dr Secundino Dach   Chronic--- treated with ureteral stent   Urinary tract infection without hematuria    Uses walker    and wheelchair for longer distance  Ventricular ectopy 12/28/2020   Wears glasses     Past Surgical History:  Procedure Laterality Date   APPENDECTOMY     BACK SURGERY     CARDIOVERSION  09/ 2018   dr Gordan Latina   successful ([facility] )   CHOLECYSTECTOMY  2010   COLONOSCOPY W/ POLYPECTOMY  05/2017   CYSTO/  BALLOON DILATION RIGHT URETERAL STRICTURE/ STENT PLACEMENT  06-02-2013   CYSTO/  BILATERAL RETROGRADE PYELOGRAM/ RIGHT URETEROSCOPY AND STENT PLACEMENT  10-04-2005   CYSTO/ LEFT URETEROSCOPIC STONE EXTRACTION  04-03-2006   CYSTOSCOPY W/ RETROGRADES Right 01/03/2019   Procedure: CYSTOSCOPY WITH RETROGRADE PYELOGRAM RIGHT WITH STENT EXCHANGE;  Surgeon: Homero Luster, MD;  Location: [facility];  Service: Urology;  Laterality: Right;   CYSTOSCOPY W/ URETERAL STENT PLACEMENT Right 02/06/2014   Procedure: CYSTOSCOPY WITH RIGHT RETROGRADE PYELOGRAM RIGHT STENT REMOVAL and REPLACEMENT, Right Ureteroscopy;  Surgeon: Edmund Gouge, MD;  [facility];  Service: Urology;  Laterality: Right;   CYSTOSCOPY W/ URETERAL STENT PLACEMENT Right 11/26/2016   Procedure: CYSTOSCOPY WITH RETROGRADE PYELOGRAM/URETERAL STENT REPLACEMENT;  Surgeon: Osborn Blaze, MD;  Location: [facility];  Service: Urology;  Laterality: Right;    CYSTOSCOPY W/ URETERAL STENT PLACEMENT Right 04/24/2017   Procedure: CYSTOSCOPY WITH RETROGRADE PYELOGRAM/URETERAL STENT REPLACEMENT;  Surgeon: Osborn Blaze, MD;  Location: [facility];  Service: Urology;  Laterality: Right;   CYSTOSCOPY W/ URETERAL STENT PLACEMENT Right 10/07/2017   Procedure: CYSTOSCOPY WITH RETROGRADE PYELOGRAM/URETERAL STENT EXCHANGE;  Surgeon: Osborn Blaze, MD;  Location: [facility];  Service: Urology;  Laterality: Right;   CYSTOSCOPY W/ URETERAL STENT PLACEMENT Right 03/03/2018   Procedure: CYSTOSCOPY WITH RIGHT RETROGRADE / RIGHT URETERAL STENT EXCHANGE;  Surgeon: Osborn Blaze, MD;  [facility];  Service: Urology;  Laterality: Right;   CYSTOSCOPY W/ URETERAL STENT PLACEMENT Right 09/22/2018   Procedure: CYSTOSCOPY WITH RETROGRADE PYELOGRAM/URETERAL STENT PLACEMENT;  Surgeon: Osborn Blaze, MD;  Location: [facility];  Service: Urology;  Laterality: Right;  45 MINS   CYSTOSCOPY W/ URETERAL STENT PLACEMENT Right 06/03/2019   Procedure: CYSTOSCOPY WITH RETROGRADE PYELOGRAM/URETERAL STENT REPLACEMENT;  Surgeon: Osborn Blaze, MD;  Location: [facility];  Service: Urology;  Laterality: Right;   CYSTOSCOPY W/ URETERAL STENT PLACEMENT Right 11/23/2019   Procedure: CYSTOSCOPY WITH RETROGRADE PYELOGRAM/URETERAL STENT PLACEMENT;  Surgeon: Osborn Blaze, MD;  Location: [facility];  Service: Urology;  Laterality: Right;  45 MINS   CYSTOSCOPY WITH LITHOLAPAXY N/A 11/21/2013   Procedure: CYSTOSCOPY WITH LITHOLAPAXY;  Surgeon: Edmund Gouge, MD;  Location: [facility];  Service: Urology;  Laterality: N/A;   CYSTOSCOPY WITH RETROGRADE PYELOGRAM, URETEROSCOPY AND STENT PLACEMENT Bilateral 03/15/2014   Procedure: CYSTOSCOPY WITH BILATERAL RETROGRADE PYELOGRAM, RIGHT DIAGNOSTIC URETEROSCOPY AND STENT EXCHANGE;  Surgeon: Osborn Blaze, MD;  [facility];  Service: Urology;   Laterality: Bilateral;   CYSTOSCOPY WITH RETROGRADE PYELOGRAM, URETEROSCOPY AND STENT PLACEMENT Left 03/13/2021   Procedure: FIRST STAGE: CYSTOSCOPY WITH RETROGRADE PYELOGRAM, URETEROSCOPY AND STENT PLACEMENT;  Surgeon: Osborn Blaze, MD;  [facility];  Service: Urology;  Laterality: Left;  75 MINS   CYSTOSCOPY WITH RETROGRADE PYELOGRAM, URETEROSCOPY AND STENT PLACEMENT Left 03/29/2021   Procedure: SECOND STGAE:CYSTOSCOPY WITH RETROGRADE PYELOGRAM, URETEROSCOPY AND STENT EXCHANGE;  Surgeon: Osborn Blaze, MD;  [facility];  Service: Urology;  Laterality: Left;   CYSTOSCOPY WITH STENT PLACEMENT Right 05/07/2016   Procedure: CYSTOSCOPY WITH RETROGRADE PYELOGRAM, URETERAL STENT PLACEMENT;  Surgeon: Trent Frizzle, MD;  Location:  [facility];  Service: Urology;  Laterality: Right;   EYE SURGERY     Bilateral cataract removal   HEMIARTHROPLASTY HIP Right 11/2016   fractured   HOLMIUM LASER APPLICATION Left 03/13/2021   Procedure: HOLMIUM LASER APPLICATION;  Surgeon: Osborn Blaze, MD;  [facility];  Service: Urology;  Laterality: Left;   IR CATHETER TUBE CHANGE  02/04/2019   IR CATHETER TUBE CHANGE  02/04/2019   IR CATHETER TUBE CHANGE  02/11/2019   IR RADIOLOGIST EVAL & MGMT  03/09/2019   JOINT REPLACEMENT     LUMBAR FUSION  JULY 2013   NEPHROLITHOTOMY  X2 YRS AGO   OPEN REDUCTION INTERNAL FIXATION (ORIF) TIBIA/FIBULA FRACTURE Right 02/15/2024   Procedure: OPEN REDUCTION INTERNAL FIXATION (ORIF) TIBIA/FIBULA FRACTURE;  Surgeon: Laneta Pintos, MD;  [facility];  Service: Orthopedics;  Laterality: Right;   PERCUTANEOUS NEPHROSTOMY  12/2018   ROBOT ASSISTED LAPAROSCOPIC NEPHRECTOMY Right 03/23/2020   Procedure: XI ROBOTIC ASSISTED LAPAROSCOPIC NEPHRECTOMY;  Surgeon: Osborn Blaze, MD;  [facility];  Service: Urology;  Laterality: Right;   TOTAL ABDOMINAL HYSTERECTOMY W/ BILATERAL SALPINGOOPHORECTOMY  1989   TOTAL KNEE ARTHROPLASTY Bilateral 1992  &  1996   TRANSTHORACIC  ECHOCARDIOGRAM  07-24-2017   dr Gordan Latina   ef 60-65% (improved from last echo 2014, was 45-50%)/  trace TR/  mild AV sclerosis without stenosis   URETEROSCOPY Right 11/21/2013   Procedure: URETEROSCOPY WITH STENT REMOVAL AND REPLACEMENT;  Surgeon: Edmund Gouge, MD;  Location: [facility];  Service: Urology;  Laterality: Right;    MEDICATIONS: No current facility-administered medications for this encounter.    acetaminophen  (TYLENOL ) 500 MG tablet   albuterol  (VENTOLIN  HFA) 108 (90 Base) MCG/ACT inhaler   aluminum-magnesium  hydroxide-simethicone (MAALOX) 200-200-20 MG/5ML SUSP   amiodarone  (PACERONE ) 200 MG tablet   amitriptyline  (ELAVIL ) 10 MG tablet   apixaban  (ELIQUIS ) 2.5 MG TABS tablet   carvedilol  (COREG ) 3.125 MG tablet   Cholecalciferol  1.25 MG (50000 UT) capsule   CRANBERRY-VITAMIN C-INULIN PO   feeding supplement (BOOST / RESOURCE BREEZE) LIQD   fosfomycin (MONUROL) 3 g PACK   furosemide  (LASIX ) 20 MG tablet   hydrALAZINE (APRESOLINE) 25 MG tablet   hydrocortisone  (ANUSOL -HC) 25 MG suppository   hydroxyurea  (HYDREA ) 500 MG capsule   linezolid  (ZYVOX ) 600 MG tablet   loperamide  (IMODIUM  A-D) 2 MG tablet   methocarbamol  (ROBAXIN ) 500 MG tablet   mirtazapine (REMERON) 15 MG tablet   omeprazole (PRILOSEC) 40 MG capsule   ondansetron  (ZOFRAN -ODT) 4 MG disintegrating tablet   OVER THE COUNTER MEDICATION   oxyCODONE  (OXY IR/ROXICODONE ) 5 MG immediate release tablet   polyethylene glycol (MIRALAX  / GLYCOLAX ) 17 g packet   senna-docusate (SENOKOT-S) 8.6-50 MG tablet   triamcinolone  cream (KENALOG ) 0.1 %   losartan  (COZAAR ) 25 MG tablet   Spacer/Aero-Holding Chambers (COMPACT SPACE CHAMBER) DEVI    Ella Gun, PA-C Surgical Short Stay/Anesthesiology [facility] Phone [phone] [facility] Phone [phone] 04/06/2024 3:01 PM

## 2024-04-07 ENCOUNTER — Inpatient Hospital Stay (HOSPITAL_COMMUNITY): Admitting: Vascular Surgery

## 2024-04-07 ENCOUNTER — Other Ambulatory Visit: Payer: Self-pay

## 2024-04-07 ENCOUNTER — Inpatient Hospital Stay (HOSPITAL_COMMUNITY)
Admission: RE | Admit: 2024-04-07 | Discharge: 2024-04-12 | DRG: 493 | Disposition: A | Discharge status: Skilled Nursing Facility | Attending: Student | Admitting: Student

## 2024-04-07 ENCOUNTER — Encounter (HOSPITAL_COMMUNITY)
Admission: RE | Disposition: A | Discharge status: Skilled Nursing Facility | Payer: Self-pay | Source: Home / Self Care | Attending: Student

## 2024-04-07 ENCOUNTER — Inpatient Hospital Stay (HOSPITAL_COMMUNITY)

## 2024-04-07 ENCOUNTER — Encounter (HOSPITAL_COMMUNITY): Payer: Self-pay | Admitting: Student

## 2024-04-07 DIAGNOSIS — N179 Acute kidney failure, unspecified: Secondary | ICD-10-CM | POA: Diagnosis not present

## 2024-04-07 DIAGNOSIS — I480 Paroxysmal atrial fibrillation: Secondary | ICD-10-CM | POA: Diagnosis not present

## 2024-04-07 DIAGNOSIS — Z1621 Resistance to vancomycin: Secondary | ICD-10-CM | POA: Diagnosis not present

## 2024-04-07 DIAGNOSIS — E559 Vitamin D deficiency, unspecified: Secondary | ICD-10-CM | POA: Diagnosis not present

## 2024-04-07 DIAGNOSIS — E440 Moderate protein-calorie malnutrition: Secondary | ICD-10-CM | POA: Diagnosis not present

## 2024-04-07 DIAGNOSIS — Z96653 Presence of artificial knee joint, bilateral: Secondary | ICD-10-CM | POA: Diagnosis present

## 2024-04-07 DIAGNOSIS — Z8249 Family history of ischemic heart disease and other diseases of the circulatory system: Secondary | ICD-10-CM

## 2024-04-07 DIAGNOSIS — I2510 Atherosclerotic heart disease of native coronary artery without angina pectoris: Secondary | ICD-10-CM | POA: Diagnosis not present

## 2024-04-07 DIAGNOSIS — I5032 Chronic diastolic (congestive) heart failure: Secondary | ICD-10-CM | POA: Diagnosis present

## 2024-04-07 DIAGNOSIS — L0390 Cellulitis, unspecified: Secondary | ICD-10-CM | POA: Diagnosis not present

## 2024-04-07 DIAGNOSIS — D62 Acute posthemorrhagic anemia: Secondary | ICD-10-CM | POA: Diagnosis not present

## 2024-04-07 DIAGNOSIS — Z8679 Personal history of other diseases of the circulatory system: Secondary | ICD-10-CM

## 2024-04-07 DIAGNOSIS — Z8542 Personal history of malignant neoplasm of other parts of uterus: Secondary | ICD-10-CM

## 2024-04-07 DIAGNOSIS — E669 Obesity, unspecified: Secondary | ICD-10-CM | POA: Diagnosis present

## 2024-04-07 DIAGNOSIS — I739 Peripheral vascular disease, unspecified: Secondary | ICD-10-CM | POA: Diagnosis not present

## 2024-04-07 DIAGNOSIS — Z79899 Other long term (current) drug therapy: Secondary | ICD-10-CM | POA: Diagnosis not present

## 2024-04-07 DIAGNOSIS — T8469XA Infection and inflammatory reaction due to internal fixation device of other site, initial encounter: Secondary | ICD-10-CM

## 2024-04-07 DIAGNOSIS — R262 Difficulty in walking, not elsewhere classified: Secondary | ICD-10-CM | POA: Diagnosis not present

## 2024-04-07 DIAGNOSIS — E785 Hyperlipidemia, unspecified: Secondary | ICD-10-CM

## 2024-04-07 DIAGNOSIS — M86171 Other acute osteomyelitis, right ankle and foot: Secondary | ICD-10-CM

## 2024-04-07 DIAGNOSIS — Y838 Other surgical procedures as the cause of abnormal reaction of the patient, or of later complication, without mention of misadventure at the time of the procedure: Secondary | ICD-10-CM | POA: Diagnosis present

## 2024-04-07 DIAGNOSIS — D72829 Elevated white blood cell count, unspecified: Secondary | ICD-10-CM | POA: Diagnosis not present

## 2024-04-07 DIAGNOSIS — S82831D Other fracture of upper and lower end of right fibula, subsequent encounter for closed fracture with routine healing: Secondary | ICD-10-CM

## 2024-04-07 DIAGNOSIS — N1832 Chronic kidney disease, stage 3b: Secondary | ICD-10-CM | POA: Diagnosis present

## 2024-04-07 DIAGNOSIS — M869 Osteomyelitis, unspecified: Secondary | ICD-10-CM | POA: Diagnosis not present

## 2024-04-07 DIAGNOSIS — Z87440 Personal history of urinary (tract) infections: Secondary | ICD-10-CM

## 2024-04-07 DIAGNOSIS — Z8711 Personal history of peptic ulcer disease: Secondary | ICD-10-CM

## 2024-04-07 DIAGNOSIS — Z90722 Acquired absence of ovaries, bilateral: Secondary | ICD-10-CM

## 2024-04-07 DIAGNOSIS — W19XXXD Unspecified fall, subsequent encounter: Secondary | ICD-10-CM | POA: Diagnosis not present

## 2024-04-07 DIAGNOSIS — G8918 Other acute postprocedural pain: Secondary | ICD-10-CM | POA: Diagnosis not present

## 2024-04-07 DIAGNOSIS — I252 Old myocardial infarction: Secondary | ICD-10-CM

## 2024-04-07 DIAGNOSIS — I10 Essential (primary) hypertension: Secondary | ICD-10-CM | POA: Diagnosis not present

## 2024-04-07 DIAGNOSIS — R197 Diarrhea, unspecified: Secondary | ICD-10-CM | POA: Diagnosis not present

## 2024-04-07 DIAGNOSIS — N8110 Cystocele, unspecified: Secondary | ICD-10-CM | POA: Diagnosis not present

## 2024-04-07 DIAGNOSIS — Z7901 Long term (current) use of anticoagulants: Secondary | ICD-10-CM | POA: Diagnosis not present

## 2024-04-07 DIAGNOSIS — Z743 Need for continuous supervision: Secondary | ICD-10-CM | POA: Diagnosis not present

## 2024-04-07 DIAGNOSIS — Z885 Allergy status to narcotic agent status: Secondary | ICD-10-CM

## 2024-04-07 DIAGNOSIS — T84498A Other mechanical complication of other internal orthopedic devices, implants and grafts, initial encounter: Secondary | ICD-10-CM

## 2024-04-07 DIAGNOSIS — Z472 Encounter for removal of internal fixation device: Secondary | ICD-10-CM | POA: Diagnosis not present

## 2024-04-07 DIAGNOSIS — T84622A Infection and inflammatory reaction due to internal fixation device of right tibia, initial encounter: Principal | ICD-10-CM | POA: Diagnosis present

## 2024-04-07 DIAGNOSIS — Z860100 Personal history of colon polyps, unspecified: Secondary | ICD-10-CM

## 2024-04-07 DIAGNOSIS — I5181 Takotsubo syndrome: Secondary | ICD-10-CM | POA: Diagnosis present

## 2024-04-07 DIAGNOSIS — K219 Gastro-esophageal reflux disease without esophagitis: Secondary | ICD-10-CM | POA: Diagnosis not present

## 2024-04-07 DIAGNOSIS — Z905 Acquired absence of kidney: Secondary | ICD-10-CM | POA: Diagnosis not present

## 2024-04-07 DIAGNOSIS — Z8551 Personal history of malignant neoplasm of bladder: Secondary | ICD-10-CM

## 2024-04-07 DIAGNOSIS — I4891 Unspecified atrial fibrillation: Secondary | ICD-10-CM

## 2024-04-07 DIAGNOSIS — Z9049 Acquired absence of other specified parts of digestive tract: Secondary | ICD-10-CM

## 2024-04-07 DIAGNOSIS — T8131XA Disruption of external operation (surgical) wound, not elsewhere classified, initial encounter: Secondary | ICD-10-CM | POA: Diagnosis not present

## 2024-04-07 DIAGNOSIS — M810 Age-related osteoporosis without current pathological fracture: Secondary | ICD-10-CM | POA: Diagnosis not present

## 2024-04-07 DIAGNOSIS — Z87442 Personal history of urinary calculi: Secondary | ICD-10-CM

## 2024-04-07 DIAGNOSIS — R2689 Other abnormalities of gait and mobility: Secondary | ICD-10-CM | POA: Diagnosis not present

## 2024-04-07 DIAGNOSIS — M6281 Muscle weakness (generalized): Secondary | ICD-10-CM | POA: Diagnosis not present

## 2024-04-07 DIAGNOSIS — I214 Non-ST elevation (NSTEMI) myocardial infarction: Secondary | ICD-10-CM | POA: Diagnosis not present

## 2024-04-07 DIAGNOSIS — S82301D Unspecified fracture of lower end of right tibia, subsequent encounter for closed fracture with routine healing: Secondary | ICD-10-CM

## 2024-04-07 DIAGNOSIS — Z981 Arthrodesis status: Secondary | ICD-10-CM

## 2024-04-07 DIAGNOSIS — R2681 Unsteadiness on feet: Secondary | ICD-10-CM | POA: Diagnosis not present

## 2024-04-07 DIAGNOSIS — R7989 Other specified abnormal findings of blood chemistry: Secondary | ICD-10-CM | POA: Diagnosis not present

## 2024-04-07 DIAGNOSIS — I130 Hypertensive heart and chronic kidney disease with heart failure and stage 1 through stage 4 chronic kidney disease, or unspecified chronic kidney disease: Secondary | ICD-10-CM | POA: Diagnosis not present

## 2024-04-07 DIAGNOSIS — N183 Chronic kidney disease, stage 3 unspecified: Secondary | ICD-10-CM | POA: Diagnosis not present

## 2024-04-07 DIAGNOSIS — B952 Enterococcus as the cause of diseases classified elsewhere: Secondary | ICD-10-CM | POA: Diagnosis present

## 2024-04-07 DIAGNOSIS — B965 Pseudomonas (aeruginosa) (mallei) (pseudomallei) as the cause of diseases classified elsewhere: Secondary | ICD-10-CM | POA: Diagnosis not present

## 2024-04-07 DIAGNOSIS — Z90710 Acquired absence of both cervix and uterus: Secondary | ICD-10-CM

## 2024-04-07 DIAGNOSIS — D75839 Thrombocytosis, unspecified: Secondary | ICD-10-CM | POA: Diagnosis not present

## 2024-04-07 DIAGNOSIS — S82201A Unspecified fracture of shaft of right tibia, initial encounter for closed fracture: Secondary | ICD-10-CM | POA: Diagnosis not present

## 2024-04-07 HISTORY — PX: HARDWARE REMOVAL: SHX979

## 2024-04-07 HISTORY — PX: INCISION AND DRAINAGE OF WOUND: SHX1803

## 2024-04-07 HISTORY — DX: Other mechanical complication of other internal orthopedic devices, implants and grafts, initial encounter: T84.498A

## 2024-04-07 LAB — CBC
HCT: 43.9 % (ref 36.0–46.0)
Hemoglobin: 13.6 g/dL (ref 12.0–15.0)
MCH: 27.1 pg (ref 26.0–34.0)
MCHC: 31 g/dL (ref 30.0–36.0)
MCV: 87.6 fL (ref 80.0–100.0)
Platelets: 521 10*3/uL — ABNORMAL HIGH (ref 150–400)
RBC: 5.01 MIL/uL (ref 3.87–5.11)
RDW: 16.8 % — ABNORMAL HIGH (ref 11.5–15.5)
WBC: 9.1 10*3/uL (ref 4.0–10.5)
nRBC: 0 % (ref 0.0–0.2)

## 2024-04-07 LAB — BASIC METABOLIC PANEL WITH GFR
Anion gap: 9 (ref 5–15)
BUN: 11 mg/dL (ref 8–23)
CO2: 23 mmol/L (ref 22–32)
Calcium: 9.6 mg/dL (ref 8.9–10.3)
Chloride: 100 mmol/L (ref 98–111)
Creatinine, Ser: 1.19 mg/dL — ABNORMAL HIGH (ref 0.44–1.00)
GFR, Estimated: 46 mL/min — ABNORMAL LOW (ref >60)
Glucose, Bld: 123 mg/dL — ABNORMAL HIGH (ref 70–99)
Potassium: 4.2 mmol/L (ref 3.5–5.1)
Sodium: 132 mmol/L — ABNORMAL LOW (ref 135–145)

## 2024-04-07 LAB — MRSA NEXT GEN BY PCR, NASAL: MRSA by PCR Next Gen: NOT DETECTED

## 2024-04-07 LAB — C-REACTIVE PROTEIN: CRP: 6.4 mg/dL — ABNORMAL HIGH

## 2024-04-07 LAB — SEDIMENTATION RATE: Sed Rate: 45 mm/h — ABNORMAL HIGH (ref 0–22)

## 2024-04-07 LAB — GLUCOSE, CAPILLARY: Glucose-Capillary: 104 mg/dL — ABNORMAL HIGH (ref 70–99)

## 2024-04-07 SURGERY — IRRIGATION AND DEBRIDEMENT WOUND
Anesthesia: Monitor Anesthesia Care | Laterality: Right

## 2024-04-07 MED ORDER — SODIUM CHLORIDE 0.9 % IR SOLN
Status: DC | PRN
  Administered 2024-04-07: 1000 mL

## 2024-04-07 MED ORDER — ORAL CARE MOUTH RINSE: 15.0000 mL | OROMUCOSAL | Status: DC | PRN

## 2024-04-07 MED ORDER — SODIUM CHLORIDE 0.9 % IV SOLN
2.0000 g | INTRAVENOUS | Status: DC
  Administered 2024-04-07: 2 g via INTRAVENOUS
  Filled 2024-04-07: qty 20

## 2024-04-07 MED ORDER — METHOCARBAMOL 500 MG PO TABS
500.0000 mg | ORAL_TABLET | Freq: Three times a day (TID) | ORAL | Status: DC | PRN
  Administered 2024-04-11: 500 mg via ORAL
  Filled 2024-04-07: qty 1

## 2024-04-07 MED ORDER — FENTANYL CITRATE (PF) 250 MCG/5ML IJ SOLN
INTRAMUSCULAR | Status: AC
  Filled 2024-04-07: qty 5

## 2024-04-07 MED ORDER — SENNOSIDES-DOCUSATE SODIUM 8.6-50 MG PO TABS: 2.0000 | ORAL_TABLET | Freq: Every day | ORAL | Status: DC | PRN

## 2024-04-07 MED ORDER — ONDANSETRON HCL 4 MG PO TABS
4.0000 mg | ORAL_TABLET | Freq: Four times a day (QID) | ORAL | Status: DC | PRN
  Administered 2024-04-09 – 2024-04-10 (×2): 4 mg via ORAL
  Filled 2024-04-07 (×2): qty 1

## 2024-04-07 MED ORDER — ACETAMINOPHEN 325 MG PO TABS: 325.0000 mg | ORAL_TABLET | Freq: Four times a day (QID) | ORAL | Status: DC | PRN

## 2024-04-07 MED ORDER — SODIUM CHLORIDE 0.9 % IV SOLN
8.0000 mg/kg | Freq: Every day | INTRAVENOUS | Status: DC
  Administered 2024-04-07 – 2024-04-08 (×2): 600 mg via INTRAVENOUS
  Filled 2024-04-07 (×3): qty 12

## 2024-04-07 MED ORDER — MIRTAZAPINE 15 MG PO TABS
15.0000 mg | ORAL_TABLET | Freq: Every day | ORAL | Status: DC
  Administered 2024-04-07 – 2024-04-11 (×5): 15 mg via ORAL
  Filled 2024-04-07 (×5): qty 1

## 2024-04-07 MED ORDER — ORAL CARE MOUTH RINSE
15.0000 mL | OROMUCOSAL | Status: AC | PRN
Start: 2024-04-07 — End: ?

## 2024-04-07 MED ORDER — ALUM & MAG HYDROXIDE-SIMETH 200-200-20 MG/5ML PO SUSP
30.0000 mL | ORAL | Status: DC | PRN
  Administered 2024-04-10 – 2024-04-11 (×2): 30 mL via ORAL
  Filled 2024-04-07 (×2): qty 30

## 2024-04-07 MED ORDER — METOCLOPRAMIDE HCL 5 MG PO TABS: 5.0000 mg | ORAL_TABLET | Freq: Three times a day (TID) | ORAL | Status: DC | PRN

## 2024-04-07 MED ORDER — OXYCODONE HCL 5 MG PO TABS
2.5000 mg | ORAL_TABLET | ORAL | Status: DC | PRN
  Administered 2024-04-08 (×2): 5 mg via ORAL
  Administered 2024-04-08: 2.5 mg via ORAL
  Administered 2024-04-08 – 2024-04-11 (×2): 5 mg via ORAL
  Filled 2024-04-07 (×5): qty 1

## 2024-04-07 MED ORDER — FENTANYL CITRATE (PF) 100 MCG/2ML IJ SOLN
INTRAMUSCULAR | Status: DC | PRN
  Administered 2024-04-07: 25 ug via INTRAVENOUS

## 2024-04-07 MED ORDER — ACETAMINOPHEN 500 MG PO TABS
1000.0000 mg | ORAL_TABLET | Freq: Once | ORAL | Status: AC
  Administered 2024-04-07: 1000 mg via ORAL
  Filled 2024-04-07: qty 2

## 2024-04-07 MED ORDER — SODIUM CHLORIDE 0.9% FLUSH: 3.0000 mL | INTRAVENOUS | Status: DC | PRN

## 2024-04-07 MED ORDER — ONDANSETRON HCL 4 MG/2ML IJ SOLN
INTRAMUSCULAR | Status: DC | PRN
  Administered 2024-04-07: 4 mg via INTRAVENOUS

## 2024-04-07 MED ORDER — DOCUSATE SODIUM 100 MG PO CAPS
100.0000 mg | ORAL_CAPSULE | Freq: Two times a day (BID) | ORAL | Status: DC
  Administered 2024-04-07 (×2): 100 mg via ORAL
  Filled 2024-04-07 (×7): qty 1

## 2024-04-07 MED ORDER — BOOST / RESOURCE BREEZE PO LIQD CUSTOM
1.0000 | Freq: Every day | ORAL | Status: DC
  Administered 2024-04-07 – 2024-04-12 (×6): 1 via ORAL

## 2024-04-07 MED ORDER — PROPOFOL 10 MG/ML IV BOLUS
INTRAVENOUS | Status: DC | PRN
  Administered 2024-04-07: 30 mg via INTRAVENOUS

## 2024-04-07 MED ORDER — ACETAMINOPHEN 10 MG/ML IV SOLN: 1000.0000 mg | Freq: Once | INTRAVENOUS | Status: DC | PRN

## 2024-04-07 MED ORDER — AMITRIPTYLINE HCL 10 MG PO TABS
10.0000 mg | ORAL_TABLET | Freq: Every day | ORAL | Status: DC
  Administered 2024-04-07 – 2024-04-11 (×5): 10 mg via ORAL
  Filled 2024-04-07 (×6): qty 1

## 2024-04-07 MED ORDER — ONDANSETRON HCL 4 MG/2ML IJ SOLN: 4.0000 mg | Freq: Once | INTRAMUSCULAR | Status: DC | PRN

## 2024-04-07 MED ORDER — PNEUMOCOCCAL 20-VAL CONJ VACC 0.5 ML IM SUSY
0.5000 mL | PREFILLED_SYRINGE | INTRAMUSCULAR | Status: DC
  Filled 2024-04-07: qty 0.5

## 2024-04-07 MED ORDER — PROPOFOL 500 MG/50ML IV EMUL
INTRAVENOUS | Status: DC | PRN
  Administered 2024-04-07: 75 ug/kg/min via INTRAVENOUS

## 2024-04-07 MED ORDER — HYDROXYUREA 500 MG PO CAPS
500.0000 mg | ORAL_CAPSULE | ORAL | Status: DC
  Administered 2024-04-08 – 2024-04-11 (×2): 500 mg via ORAL
  Filled 2024-04-07 (×2): qty 1

## 2024-04-07 MED ORDER — METOCLOPRAMIDE HCL 5 MG/ML IJ SOLN: 5.0000 mg | Freq: Three times a day (TID) | INTRAMUSCULAR | Status: DC | PRN

## 2024-04-07 MED ORDER — 0.9 % SODIUM CHLORIDE (POUR BTL) OPTIME
TOPICAL | Status: DC | PRN
  Administered 2024-04-07: 1000 mL

## 2024-04-07 MED ORDER — VANCOMYCIN HCL 1000 MG IV SOLR
INTRAVENOUS | Status: AC
Start: 2024-04-07 — End: 2024-04-07
  Filled 2024-04-07: qty 20

## 2024-04-07 MED ORDER — CHLORHEXIDINE GLUCONATE 0.12 % MT SOLN
15.0000 mL | Freq: Once | OROMUCOSAL | Status: AC
  Administered 2024-04-07: 15 mL via OROMUCOSAL
  Filled 2024-04-07: qty 15

## 2024-04-07 MED ORDER — CARVEDILOL 3.125 MG PO TABS
3.1250 mg | ORAL_TABLET | Freq: Two times a day (BID) | ORAL | Status: DC
  Administered 2024-04-07 – 2024-04-12 (×10): 3.125 mg via ORAL
  Filled 2024-04-07 (×10): qty 1

## 2024-04-07 MED ORDER — PANTOPRAZOLE SODIUM 40 MG PO TBEC
80.0000 mg | DELAYED_RELEASE_TABLET | Freq: Every day | ORAL | Status: DC
  Administered 2024-04-08 – 2024-04-12 (×5): 80 mg via ORAL
  Filled 2024-04-07 (×5): qty 2

## 2024-04-07 MED ORDER — FENTANYL CITRATE (PF) 100 MCG/2ML IJ SOLN: 25.0000 ug | INTRAMUSCULAR | Status: DC | PRN

## 2024-04-07 MED ORDER — LOPERAMIDE HCL 2 MG PO CAPS
4.0000 mg | ORAL_CAPSULE | Freq: Four times a day (QID) | ORAL | Status: DC | PRN
  Administered 2024-04-09 – 2024-04-12 (×4): 4 mg via ORAL
  Filled 2024-04-07 (×4): qty 2

## 2024-04-07 MED ORDER — FUROSEMIDE 20 MG PO TABS: 20.0000 mg | ORAL_TABLET | Freq: Every day | ORAL | Status: DC | PRN

## 2024-04-07 MED ORDER — GERHARDT'S BUTT CREAM
TOPICAL_CREAM | Freq: Two times a day (BID) | CUTANEOUS | Status: DC
  Filled 2024-04-07: qty 60

## 2024-04-07 MED ORDER — LINEZOLID 600 MG PO TABS
600.0000 mg | ORAL_TABLET | Freq: Two times a day (BID) | ORAL | Status: DC
  Filled 2024-04-07: qty 1

## 2024-04-07 MED ORDER — HYDRALAZINE HCL 25 MG PO TABS
25.0000 mg | ORAL_TABLET | Freq: Two times a day (BID) | ORAL | Status: DC
  Administered 2024-04-08 – 2024-04-12 (×7): 25 mg via ORAL
  Filled 2024-04-07 (×7): qty 1

## 2024-04-07 MED ORDER — CLONIDINE HCL (ANALGESIA) 100 MCG/ML EP SOLN
EPIDURAL | Status: DC | PRN
  Administered 2024-04-07: 100 ug

## 2024-04-07 MED ORDER — ACETAMINOPHEN 325 MG PO TABS
650.0000 mg | ORAL_TABLET | Freq: Four times a day (QID) | ORAL | Status: AC
  Administered 2024-04-07 (×2): 650 mg via ORAL
  Filled 2024-04-07 (×3): qty 2

## 2024-04-07 MED ORDER — SODIUM CHLORIDE 0.9% FLUSH
3.0000 mL | Freq: Two times a day (BID) | INTRAVENOUS | Status: DC
  Administered 2024-04-07: 10 mL via INTRAVENOUS
  Administered 2024-04-07: 5 mL via INTRAVENOUS
  Administered 2024-04-08 (×2): 3 mL via INTRAVENOUS
  Administered 2024-04-09 – 2024-04-10 (×4): 10 mL via INTRAVENOUS
  Administered 2024-04-11: 3 mL via INTRAVENOUS
  Administered 2024-04-11 – 2024-04-12 (×2): 10 mL via INTRAVENOUS

## 2024-04-07 MED ORDER — VANCOMYCIN HCL 1000 MG IV SOLR
INTRAVENOUS | Status: DC | PRN
  Administered 2024-04-07: 1000 mg via TOPICAL

## 2024-04-07 MED ORDER — ONDANSETRON HCL 4 MG/2ML IJ SOLN
4.0000 mg | Freq: Four times a day (QID) | INTRAMUSCULAR | Status: DC | PRN
  Administered 2024-04-08: 4 mg via INTRAVENOUS
  Filled 2024-04-07: qty 2

## 2024-04-07 MED ORDER — FENTANYL CITRATE (PF) 100 MCG/2ML IJ SOLN
INTRAMUSCULAR | Status: AC
  Filled 2024-04-07: qty 2

## 2024-04-07 MED ORDER — AMIODARONE HCL 200 MG PO TABS
200.0000 mg | ORAL_TABLET | Freq: Every day | ORAL | Status: DC
  Administered 2024-04-08 – 2024-04-12 (×5): 200 mg via ORAL
  Filled 2024-04-07 (×5): qty 1

## 2024-04-07 MED ORDER — PHENYLEPHRINE HCL-NACL 20-0.9 MG/250ML-% IV SOLN
INTRAVENOUS | Status: DC | PRN
  Administered 2024-04-07: 25 ug/min via INTRAVENOUS

## 2024-04-07 MED ORDER — ORAL CARE MOUTH RINSE: 15.0000 mL | Freq: Once | OROMUCOSAL | Status: AC

## 2024-04-07 MED ORDER — CEFAZOLIN SODIUM-DEXTROSE 2-4 GM/100ML-% IV SOLN
2.0000 g | INTRAVENOUS | Status: AC
  Administered 2024-04-07: 2 g via INTRAVENOUS
  Filled 2024-04-07: qty 100

## 2024-04-07 MED ORDER — ALBUTEROL SULFATE (2.5 MG/3ML) 0.083% IN NEBU: 3.0000 mL | INHALATION_SOLUTION | RESPIRATORY_TRACT | Status: DC | PRN

## 2024-04-07 MED ORDER — EPHEDRINE SULFATE-NACL 50-0.9 MG/10ML-% IV SOSY
PREFILLED_SYRINGE | INTRAVENOUS | Status: DC | PRN
  Administered 2024-04-07: 5 mg via INTRAVENOUS
  Administered 2024-04-07 (×2): 10 mg via INTRAVENOUS

## 2024-04-07 MED ORDER — LACTATED RINGERS IV SOLN: INTRAVENOUS | Status: DC

## 2024-04-07 MED ORDER — ROPIVACAINE HCL 5 MG/ML IJ SOLN
INTRAMUSCULAR | Status: DC | PRN
  Administered 2024-04-07: 15 mL via PERINEURAL
  Administered 2024-04-07: 30 mL via PERINEURAL

## 2024-04-07 SURGICAL SUPPLY — 56 items
BAG COUNTER SPONGE SURGICOUNT (BAG) ×1 IMPLANT
BANDAGE ESMARK 6X9 LF (GAUZE/BANDAGES/DRESSINGS) ×1 IMPLANT
BNDG COHESIVE 6X5 TAN ST LF (GAUZE/BANDAGES/DRESSINGS) ×1 IMPLANT
BNDG ELASTIC 4X5.8 VLCR NS LF (GAUZE/BANDAGES/DRESSINGS) IMPLANT
BNDG ELASTIC 4X5.8 VLCR STR LF (GAUZE/BANDAGES/DRESSINGS) ×1 IMPLANT
BNDG ELASTIC 6INX 5YD STR LF (GAUZE/BANDAGES/DRESSINGS) ×1 IMPLANT
BNDG GAUZE DERMACEA FLUFF 4 (GAUZE/BANDAGES/DRESSINGS) ×2 IMPLANT
BRUSH SCRUB EZ PLAIN DRY (MISCELLANEOUS) ×2 IMPLANT
CHLORAPREP W/TINT 26 (MISCELLANEOUS) ×1 IMPLANT
CNTNR URN SCR LID CUP LEK RST (MISCELLANEOUS) IMPLANT
COVER SURGICAL LIGHT HANDLE (MISCELLANEOUS) ×2 IMPLANT
CUFF TOURN SGL QUICK 18X4 (TOURNIQUET CUFF) IMPLANT
CUFF TRNQT CYL 24X4X16.5-23 (TOURNIQUET CUFF) IMPLANT
CUFF TRNQT CYL 34X4.125X (TOURNIQUET CUFF) IMPLANT
DRAPE C-ARM 42X72 X-RAY (DRAPES) IMPLANT
DRAPE C-ARMOR (DRAPES) ×1 IMPLANT
DRAPE U-SHAPE 47X51 STRL (DRAPES) ×1 IMPLANT
DRSG ADAPTIC 3X8 NADH LF (GAUZE/BANDAGES/DRESSINGS) ×1 IMPLANT
DRSG MEPITEL 4X7.2 (GAUZE/BANDAGES/DRESSINGS) IMPLANT
ELECTRODE REM PT RTRN 9FT ADLT (ELECTROSURGICAL) ×1 IMPLANT
GAUZE PAD ABD 8X10 STRL (GAUZE/BANDAGES/DRESSINGS) IMPLANT
GAUZE SPONGE 4X4 12PLY STRL (GAUZE/BANDAGES/DRESSINGS) ×1 IMPLANT
GLOVE BIO SURGEON STRL SZ 6.5 (GLOVE) ×3 IMPLANT
GLOVE BIO SURGEON STRL SZ7.5 (GLOVE) ×4 IMPLANT
GLOVE BIOGEL PI IND STRL 6.5 (GLOVE) ×1 IMPLANT
GLOVE BIOGEL PI IND STRL 7.5 (GLOVE) ×1 IMPLANT
GOWN STRL REUS W/ TWL LRG LVL3 (GOWN DISPOSABLE) ×2 IMPLANT
KIT BASIN OR (CUSTOM PROCEDURE TRAY) ×1 IMPLANT
KIT TURNOVER KIT B (KITS) ×1 IMPLANT
MANIFOLD NEPTUNE II (INSTRUMENTS) ×1 IMPLANT
NDL 22X1.5 STRL (OR ONLY) (MISCELLANEOUS) IMPLANT
NEEDLE 22X1.5 STRL (OR ONLY) (MISCELLANEOUS) IMPLANT
NS IRRIG 1000ML POUR BTL (IV SOLUTION) ×1 IMPLANT
PACK ORTHO EXTREMITY (CUSTOM PROCEDURE TRAY) ×1 IMPLANT
PAD ARMBOARD POSITIONER FOAM (MISCELLANEOUS) ×2 IMPLANT
PADDING CAST ABS COTTON 4X4 ST (CAST SUPPLIES) IMPLANT
PADDING CAST COTTON 6X4 STRL (CAST SUPPLIES) ×3 IMPLANT
SPONGE T-LAP 18X18 ~~LOC~~+RFID (SPONGE) ×1 IMPLANT
STAPLER SKIN PROX 35W (STAPLE) IMPLANT
STOCKINETTE IMPERVIOUS LG (DRAPES) ×1 IMPLANT
STRIP CLOSURE SKIN 1/2X4 (GAUZE/BANDAGES/DRESSINGS) IMPLANT
SUCTION TUBE FRAZIER 10FR DISP (SUCTIONS) IMPLANT
SUT ETHILON 2 0 FS 18 (SUTURE) IMPLANT
SUT ETHILON 3 0 PS 1 (SUTURE) IMPLANT
SUT MNCRL AB 3-0 PS2 18 (SUTURE) ×1 IMPLANT
SUT MON AB 2-0 CT1 36 (SUTURE) ×1 IMPLANT
SUT PDS AB 2-0 CT1 27 (SUTURE) IMPLANT
SUT VIC AB 0 CT1 27XBRD ANBCTR (SUTURE) IMPLANT
SUT VIC AB 2-0 CT1 TAPERPNT 27 (SUTURE) IMPLANT
SYR CONTROL 10ML LL (SYRINGE) IMPLANT
TOWEL GREEN STERILE (TOWEL DISPOSABLE) ×2 IMPLANT
TOWEL GREEN STERILE FF (TOWEL DISPOSABLE) ×2 IMPLANT
TUBE CONNECTING 12X1/4 (SUCTIONS) ×1 IMPLANT
UNDERPAD 30X36 HEAVY ABSORB (UNDERPADS AND DIAPERS) ×1 IMPLANT
WATER STERILE IRR 1000ML POUR (IV SOLUTION) ×2 IMPLANT
YANKAUER SUCT BULB TIP NO VENT (SUCTIONS) ×1 IMPLANT

## 2024-04-07 NOTE — Op Note (Signed)
 Orthopaedic Surgery Operative Note (CSN: [id number] ) Date of Surgery: 04/07/2024  Admit Date: 04/07/2024   Diagnoses: Pre-Op Diagnoses: Right distal tibia/fibula fracture s/p ORIF Fracture related hardware infection with exposed plate  Post-Op Diagnosis: Same  Procedures: CPT 14020-Tissue transfer to cover right ankle wound CPT 20680-Removal of hardware right ankle CPT 10180-Irrigation and debridement of right ankle  Surgeons : Primary: Laneta Pintos, MD  Assistant: [name], PA-C  Location: OR 3   Anesthesia:Regional   Antibiotics: Ancef  2g preop with 1 gm vancomycin  powder placed topically   Tourniquet time: None    Estimated Blood Loss: 150 mL  Complications:* No complications entered in OR log *   Specimens: ID Type Source Tests Collected by Time Destination  A : Right Ankle Tissue #1 Tissue PATH Soft tissue AEROBIC/ANAEROBIC CULTURE W GRAM STAIN (SURGICAL/DEEP WOUND) Laneta Pintos, MD 04/07/2024 1038   B : Right Ankle Tissue #2 Tissue PATH Soft tissue AEROBIC/ANAEROBIC CULTURE W GRAM STAIN (SURGICAL/DEEP WOUND) Laneta Pintos, MD 04/07/2024 1041   C : Right Ankle Tissue #3 Tissue PATH Soft tissue AEROBIC/ANAEROBIC CULTURE W GRAM STAIN (SURGICAL/DEEP WOUND) Laneta Pintos, MD 04/07/2024 1041      Implants: Implant Name Type Inv. Item Serial No. Manufacturer Lot No. LRB No. Used Action  PLATE 9H 409 RT MED DIST TIB - [id number] Plate PLATE 9H 562 RT MED DIST TIB  [employer](ORTH,TRAU,BIO,SG)  Right 1 Explanted  SCREW LOCK CORT STAR 3.5X30 - [id number] Screw SCREW LOCK CORT STAR 3.5X30  [employer](ORTH,TRAU,BIO,SG)  Right 1 Explanted  SCREW LOCK CORT STAR 3.5X32 - [id number] Screw SCREW LOCK CORT STAR 3.5X32  [employer](ORTH,TRAU,BIO,SG)  Right 1 Explanted  SCREW LOCK CORT STAR 3.5X34 - [id number] Screw SCREW LOCK CORT STAR 3.5X34  [employer](ORTH,TRAU,BIO,SG)  Right 1 Explanted  SCREW LOCK CORT STAR 3.5X42 - [id number] Screw SCREW LOCK CORT STAR 3.5X42   [employer](ORTH,TRAU,BIO,SG)  Right 1 Explanted  SCREW LOCK CORT STAR 3.5X46 - [id number] Screw SCREW LOCK CORT STAR 3.5X46  [employer](ORTH,TRAU,BIO,SG)  Right 1 Explanted  SCREW LP NL T15 3.5X26 - [id number] Screw SCREW LP NL T15 3.5X26  [employer](ORTH,TRAU,BIO,SG)  Right 4 Explanted  SCREW T15 LP CORT 3.5X48MM NS - [id number] Screw SCREW T15 LP CORT 3.5X48MM NS  [employer](ORTH,TRAU,BIO,SG)  Right 1 Explanted     Indications for Surgery: 83 year old female who underwent open reduction internal fixation of a right distal tibia fracture on 02/15/2024.  She subsequently had a UTI and possible sepsis that caused infection of her right lower extremity and she developed a wound breakdown of her right ankle above the plate.  She had exposed hardware.  Due to the exposed hardware I recommend proceeding with irrigation and debridement with removal of hardware.  I discussed with her and her family possibility of needing IV antibiotics with infectious disease consult, possible skin graft substitute placement for wound closure.  Other risks included refracture or displacement of the fracture, nerve and blood vessel injury, even the possibility of anesthetic complications.  They agreed to proceed with surgery and consent was obtained.  Operative Findings: 1.  Irrigation debridement of right open ankle wound with exposed hardware treated with removal of previous hardware. 2.  Stress exam of the distal tibia and fibula showed minimal motion at the fracture site. 3.  Rearrangement of soft tissue to perform primary closure of a 2 x 1.5 cm defect over the anterior medial tibia.  Procedure: The patient was identified in the preoperative holding area. Consent was  confirmed with the patient and their family and all questions were answered. The operative extremity was marked after confirmation with the patient. she was then brought back to the operating room by our anesthesia colleagues.  She was placed under  anesthesia and carefully transferred over to a radiolucent flattop table.  A bump was placed into her operative hip.  The right lower extremity was then prepped and draped in usual sterile fashion.  A timeout was performed to verify the patient, the procedure, and extremity.  Preoperative antibiotics were dosed.  For started out by obtaining fluoroscopic imaging showing the hardware that was in place.  I used a Cobb elevator to breed superficially along the edges of the skin as well as over the plate and sent this for specimen #1.  I then proceeded to extend the wound and removed the distal screws.  I then percutaneously removed the screws into the tibial shaft.  I was then able to elevate the plate off of the bone and remove this without difficulty.  I then gathered the tissue from the removal as well as around the plate and sent this for culture #2.  I then used a Cobb elevator to debride the underlying soft tissue and periosteum and sent this for culture #3.  I then used low-pressure pulsatile lavage to thoroughly irrigate the wound.  I then placed 1 g vancomycin  powder in the wound.  I then undermined the skin to elevated to be able to closed primarily the soft tissue defect.  I used a 2-0 nylon to approximate the skin edges and I was able to do this without undue tension on the soft tissue.  The remainder of the skin was closed.  The percutaneous incisions were closed with 3-0 nylon.  Sterile dressings were then applied.  A well-padded short leg splint was then placed.  The patient was then awoke from anesthesia and taken to the PACU in stable condition.   Debridement type: Excisional Debridement  Side: right  Body Location: Ankle  Tools used for debridement: scalpel and rongeur  Pre-debridement Wound size (cm):   Length: 2        Width: 1.5     Depth: 0.5   Post-debridement Wound size (cm):   N/A-closed  Debridement depth beyond dead/damaged tissue down to healthy viable tissue: yes  Tissue  layer involved: skin, subcutaneous tissue, muscle / fascia  Nature of tissue removed: Slough, Non-viable tissue, and Purulence  Irrigation volume: 1L     Irrigation fluid type: Normal Saline   Post Op Plan/Instructions: Patient will be nonweightbearing to the right lower extremity.  She will receive broad-spectrum antibiotics and an infectious disease consult.  We will admit her to the hospital.  Will have her mobilize with physical and Occupational Therapy.  Will await cultures for final disposition.  I was present and performed the entire surgery.  [name], PA-C did assist me throughout the case. An assistant was necessary given the difficulty in approach, maintenance of reduction and ability to instrument the fracture.   [name], MD [facility]

## 2024-04-07 NOTE — Transfer of Care (Addendum)
 Immediate Anesthesia Transfer of Care Note  Patient: [name] [name]  Procedure(s) Performed: IRRIGATION AND DEBRIDEMENT WOUND (Right) REMOVAL, HARDWARE RIGHT TIBIA (Right)  Patient Location: PACU  Anesthesia Type:General  Level of Consciousness: awake, alert , and oriented  Airway & Oxygen Therapy: Patient Spontanous Breathing  Post-op Assessment: Report given to RN and Post -op Vital signs reviewed and stable  Post vital signs: Reviewed and stable  Last Vitals:  Vitals Value Taken Time  BP 98/61 04/07/24 11:30  Temp 36.4 C 04/07/24 11:13  Pulse 67 04/07/24 11:35  Resp 15 04/07/24 11:35  SpO2 97 % 04/07/24 11:35  Vitals shown include unfiled device data.  Last Pain:  Vitals:   04/07/24 1113  PainSc: 0-No pain         Complications: No notable events documented.

## 2024-04-07 NOTE — Consult Note (Signed)
 [facility]    Date of Admission:  04/07/2024     Total days of antibiotics 1  Dapcomycin  Ceftriaxone    Pre op Linezoid               Reason for Consult: Exposed orthopedic hardware infection     Referring Provider: Santiago Cuff  Primary Care Provider: Gaither Juba, MD   Assessment: [name] [name] is a 83 y.o. female admitted for hardware removal   Hardware Associated Osteomyelitis -  Exposed Hardware s/p ORIF Tib/Fib Fx -  Slough, non-viable tissue and purulence observed and cleaned up. Hardware was removed in it's entirety. Three intraoperative cultures were taken - rare Gram positive cocci in 1 of 3 samples so far on gram stain.  In discussion with Dr. Curtiss Dowdy, no plans to replace any hardware. Nonweightbearing to RLE.  H/O VRE UTI with recent use of linezolid  leading up to hospitalization. Will start daptomycin + ceftriaxone  for empiric coverage while OR cultures pend.  Daptomycin would cover VRE as well as staph/strep organisms.  Further recommendations pending culture updates in micro.   Medication Monitoring -  Linezolid  longer term in someone with CKD (CrCl 38) would be high risk for toxic side effects. Would prefer safer alternative given she will need longer course of treatment.  Check baseline CK in AM for daptomycin treatment  CRP/ESR   Disposition -  Came in from SNF rehab   Plan: Stop linezolid  Start daptomycin (higher dose to cover VRE) and Ceftriaxone  Baseline CK, CRP, ESR in AM  Follow up micro cultures   Principal Problem:   Exposed orthopaedic hardware (HCC)    acetaminophen   650 mg Oral Q6H   [START ON 04/08/2024] amiodarone   200 mg Oral Daily   amitriptyline   10 mg Oral QHS   carvedilol   3.125 mg Oral BID WC   docusate sodium   100 mg Oral BID   feeding supplement  1 Container Oral Daily   hydrALAZINE  25 mg Oral BID   [START ON 04/08/2024] hydroxyurea   500 mg Oral Once per day on Monday Wednesday  Friday   mirtazapine  15 mg Oral QHS   [START ON 04/08/2024] pantoprazole   80 mg Oral Daily   sodium chloride  flush  3-10 mL Intravenous Q12H    HPI: [name] [name] is a 83 y.o. female with medical history significant for CKD 3B, nephrolithiasis, hyperlipidemia, hypertension, chronic HFpEF, thrombocytosis, first-degree heart block, and PAF on Eliquis  presenting for surgery on the right ankle. H/O Bladder cancer and chronic UTIs.   She sustained at fall at home on 02/11/24, resulting in a right distal tibia/fibula fracture.  She underwent ORIF of the right distal tibia with Dr. Curtiss Dowdy on 02/15/24.  She was discharged to SNF on 02/20/24. She was seen in OTS clinic for post-operative follow up and sutures were removed on 03/08/24. The incision at that time appeared to be healing well. Unfortunately, while at SNF patient noted to have wound breakdown with associated drainage and warmth over the last week or so. She was started on Doxycycline by her provider there without any improvement in her condition.  When re-evaluated in ortho trauma clinic on 04/05/24, patient noted to have exposed hardware on the medial aspect  of the ankle. She was admitted on 6/11 for hardware removal with I&D of the ankle wound.  Antibiotic history -  Cefdinir  3/17 x 7d Linezolid  6/6 x 7d  (VRE UTI) Doxycycline?  She is on flecanide for heart condition    Review of Systems: ROS  Past Medical History:  Diagnosis Date   Abdominal aortic atherosclerosis (HCC) 05/07/2016   Noted on CT abd/pelvis   Acute renal failure (HCC) 06/04/2013   Acute respiratory failure with hypoxia (HCC) 06/04/2013   Anemia, unspecified 06/08/2013   Anticoagulant long-term use    ELIQUIS    Cancer (HCC)    uterine cancer    Chronic anticoagulation 08/04/2017   Chronic diastolic (congestive) heart failure (HCC)     pt denies    Chronic diastolic congestive heart failure (HCC) 05/31/2015   Chronic kidney disease 2014   arf on dialysis in  icu   Complicated UTI (urinary tract infection) 03/31/2021   Complication of anesthesia    2021- kidney removed at [facility] pt reports did not get enough med to wake her up and she could not talk and then recovered after given more of wake up med from anesthesia    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 Wayna Hails [facility] in [place])  pt uses pessary   Diarrhea 06/11/2013   Diverticulosis 05/07/2016   Noted on CT abd/pelvis   Dyslipidemia 05/31/2015   E coli bacteremia 06/08/2013   Encounter for monitoring flecainide  therapy 07/30/2016   Essential thrombocythemia (HCC) 03/04/2021   Female genital prolapse 04/15/2016   First degree heart block    General weakness    GERD (gastroesophageal reflux disease)    Hematuria    intermittently due to ureter right stent   Hiatal hernia    High risk medication use 04/15/2016   History of arteriovenostomy for renal dialysis [facility])    History of colon polyps    History of gastric polyp    History of kidney infection    12-31-2018  perinephrtic abscess  right side  s/p drains x2 01-10-2019,  05/ 2020 drains removed   History of kidney stones    History of peptic ulcer disease    History of septic shock    2011 (pt unaware) and 06-04-2013  w/ acute renal failure   History of uterine cancer    STAGE I  --  S/P TAH W/ BSO  (NO OTHER TX)   History of ventilator dependency 2014   in setting of septic shock   Hydronephrosis 05/07/2016   Hyperlipemia 04/15/2016   Hyperlipidemia    Hypertension    Iron deficiency anemia hematology/ oncology--- dr Harles Lied at [facility] in [place]--- per lov note 08-07-2017  stabilized and felt to be more chronic anemia   01/ 2018  dx iron def. anemia  s/p  IV Iron infusion and taking oral iron supplement   Lactic acidosis 06/04/2013   Lumbar compression fracture (HCC)    01-22-2019 per imaging L1  (06-01-2019  per pt currently no pain)   Midline cystocele 04/15/2016   Nonfunctioning kidney 03/23/2020   NSTEMI, initial episode of care [facility]) 06/07/2013   pt denies    Occlusion of ureteral stent (HCC)    PAF (paroxysmal atrial fibrillation) (HCC) DX JUNE 2012   CARDIOLOGIST--  DR Parks Bollman--- [facility])  CHADS2   Palpitations 04/15/2016   Paroxysmal atrial fibrillation (HCC) 08/04/2017   Presence of pessary    Pyelonephritis 06/04/2013   Pyelonephritis of left kidney 06/04/2013   Right wrist fracture    per pt fractured on 03-10-2019,  had closed reduction  and wearing a brace   Sepsis (HCC) 05/07/2016   Septic shock (HCC) 06/04/2013   IMO SNOMED Dx Update Oct 2024     Septic shock(785.52) 06/04/2013   Tendonitis, Achilles 04/15/2016   Ureteral obstruction, right 06/04/2013   Ureteral stricture, right urologist-- dr Secundino Dach   Chronic--- treated with ureteral stent   Urinary tract infection without hematuria    Uses walker    and wheelchair for longer distance   Ventricular ectopy 12/28/2020   Wears glasses     Social History   Tobacco Use   Smoking status: Never   Smokeless tobacco: Never  Vaping Use   Vaping status: Never Used  Substance Use Topics   Alcohol use: No   Drug use: No    Family History  Problem Relation Age of Onset   Heart disease Mother    Heart failure Mother    Cancer Mother    Heart disease Father    Heart failure Father    Allergies  Allergen Reactions   Codeine Nausea Only and Other (See Comments)    hallucinations   Hydrocodone  Nausea Only and Other (See Comments)    OBJECTIVE: Blood pressure (!) 100/54, pulse 65, temperature 97.6 F (36.4 C), resp. rate 10, height 5' 7 (1.702 m), weight 75.8 kg, SpO2 100%.  Physical Exam  Lab Results Lab Results  Component Value Date   WBC 9.1 04/07/2024   HGB 13.6 04/07/2024   HCT 43.9 04/07/2024   MCV 87.6 04/07/2024   PLT 521 (H) 04/07/2024    Lab Results  Component  Value Date   CREATININE 1.19 (H) 04/07/2024   BUN 11 04/07/2024   NA 132 (L) 04/07/2024   K 4.2 04/07/2024   CL 100 04/07/2024   CO2 23 04/07/2024    Lab Results  Component Value Date   ALT 6 02/17/2024   AST 17 02/17/2024   ALKPHOS 51 02/17/2024   BILITOT 0.7 02/17/2024     Microbiology: No results found for this or any previous visit (from the past 240 hours).  Gibson Kurtz, MSN, NP-C [facility] Pager: [phone]  04/07/2024 3:00 PM

## 2024-04-07 NOTE — Progress Notes (Signed)
 Pharmacy Antibiotic Note  [name] [name] is a 83 y.o. female admitted on 04/07/2024 with hardware associated osteomyelitis.  Pharmacy has been consulted for daptomycin dosing, also on Ceftriaxone  per MD.  Plan: Daptomycin 600mg  (8mg /kg) IV q24h Check ck in AM and weekly thereafter Monitor renal function and adjust interval as needed  Height: 5' 7 (170.2 cm) Weight: 75.8 kg (167 lb) IBW/kg (Calculated) : 61.6  Temp (24hrs), Avg:97.7 F (36.5 C), Min:97.6 F (36.4 C), Max:98 F (36.7 C)  Recent Labs  Lab 04/07/24 0800  WBC 9.1  CREATININE 1.19*    Estimated Creatinine Clearance: 38.7 mL/min (A) (by C-G formula based on SCr of 1.19 mg/dL (H)).    Allergies  Allergen Reactions   Codeine Nausea Only and Other (See Comments)    hallucinations   Hydrocodone  Nausea Only and Other (See Comments)    Antimicrobials this admission: 6/12 Cefazolin  pre-op 6/12 Vancomycin  x 1 6/12 Daptomycin >> 6/12 Ceftriaxone  >>  Dose adjustments this admission:  Microbiology results: 6/12 R ankle 1: no org seen 6/12 R ankle 2: rare GPC 6/12 R ankle 3: no org seen  Thank you for allowing pharmacy to be a part of this patient's care.  Armanda Bern, PharmD, BCPS 04/07/2024 4:04 PM

## 2024-04-07 NOTE — Anesthesia Postprocedure Evaluation (Signed)
 Anesthesia Post Note  Patient: Tiny Chaudhary Check  Procedure(s) Performed: IRRIGATION AND DEBRIDEMENT WOUND (Right) REMOVAL, HARDWARE RIGHT TIBIA (Right)     Patient location during evaluation: PACU Anesthesia Type: Regional Level of consciousness: awake and alert Pain management: pain level controlled Vital Signs Assessment: post-procedure vital signs reviewed and stable Respiratory status: spontaneous breathing, nonlabored ventilation, respiratory function stable and patient connected to nasal cannula oxygen Cardiovascular status: stable and blood pressure returned to baseline Postop Assessment: no apparent nausea or vomiting Anesthetic complications: no  No notable events documented.  Last Vitals:  Vitals:   04/07/24 1130 04/07/24 1145  BP: 98/61 98/60  Pulse: 67 66  Resp: 15 10  Temp: 36.4 C   SpO2: 94% 95%    Last Pain:  Vitals:   04/07/24 1113  PainSc: 0-No pain                 Rosalita Combe

## 2024-04-07 NOTE — Interval H&P Note (Signed)
 History and Physical Interval Note:  04/07/2024 9:15 AM  [name] [name]  has presented today for surgery, with the diagnosis of Right exposed ankle hardware.  The various methods of treatment have been discussed with the patient and family. After consideration of risks, benefits and other options for treatment, the patient has consented to  Procedure(s): IRRIGATION AND DEBRIDEMENT WOUND (Right) REMOVAL, HARDWARE (Right) as a surgical intervention.  The patient's history has been reviewed, patient examined, no change in status, stable for surgery.  I have reviewed the patient's chart and labs.  Questions were answered to the patient's satisfaction.     [name] P Autumm Hattery

## 2024-04-07 NOTE — Anesthesia Procedure Notes (Signed)
 Anesthesia Regional Block: Adductor canal block   Pre-Anesthetic Checklist: , timeout performed,  Correct Patient, Correct Site, Correct Laterality,  Correct Procedure, Correct Position, site marked,  Risks and benefits discussed,  Surgical consent,  Pre-op evaluation,  At surgeon's request and post-op pain management  Laterality: Lower and Right  Prep: chloraprep       Needles:  Injection technique: Single-shot  Needle Type: Echogenic Needle     Needle Length: 9cm  Needle Gauge: 22     Additional Needles:   Procedures:,,,, ultrasound used (permanent image in chart),,    Narrative:  Start time: 04/07/2024 9:21 AM End time: 04/07/2024 9:25 AM Injection made incrementally with aspirations every 5 mL.  Performed by: Personally  Anesthesiologist: Rosalita Combe, MD  Additional Notes: Block assessed prior to surgery. Pt tolerated procedure well.

## 2024-04-07 NOTE — Anesthesia Procedure Notes (Signed)
 Anesthesia Regional Block: Popliteal block   Pre-Anesthetic Checklist: , timeout performed,  Correct Patient, Correct Site, Correct Laterality,  Correct Procedure, Correct Position, site marked,  Risks and benefits discussed,  Pre-op evaluation,  At surgeon's request and post-op pain management  Laterality: Lower and Right  Prep: Maximum Sterile Barrier Precautions used, chloraprep       Needles:  Injection technique: Single-shot  Needle Type: Echogenic Needle     Needle Length: 9cm  Needle Gauge: 21     Additional Needles:   Procedures:,,,, ultrasound used (permanent image in chart),,    Narrative:  Start time: 04/07/2024 9:16 AM End time: 04/07/2024 9:21 AM Injection made incrementally with aspirations every 5 mL.  Performed by: Personally  Anesthesiologist: Rosalita Combe, MD  Additional Notes: Block assessed. Patient tolerated procedure well.

## 2024-04-08 ENCOUNTER — Encounter (HOSPITAL_COMMUNITY): Payer: Self-pay | Admitting: Student

## 2024-04-08 DIAGNOSIS — B965 Pseudomonas (aeruginosa) (mallei) (pseudomallei) as the cause of diseases classified elsewhere: Secondary | ICD-10-CM | POA: Diagnosis not present

## 2024-04-08 DIAGNOSIS — T84498A Other mechanical complication of other internal orthopedic devices, implants and grafts, initial encounter: Secondary | ICD-10-CM | POA: Diagnosis not present

## 2024-04-08 LAB — BASIC METABOLIC PANEL WITH GFR
Anion gap: 10 (ref 5–15)
BUN: 15 mg/dL (ref 8–23)
CO2: 22 mmol/L (ref 22–32)
Calcium: 9.2 mg/dL (ref 8.9–10.3)
Chloride: 100 mmol/L (ref 98–111)
Creatinine, Ser: 1.14 mg/dL — ABNORMAL HIGH (ref 0.44–1.00)
GFR, Estimated: 48 mL/min — ABNORMAL LOW (ref >60)
Glucose, Bld: 85 mg/dL (ref 70–99)
Potassium: 4 mmol/L (ref 3.5–5.1)
Sodium: 132 mmol/L — ABNORMAL LOW (ref 135–145)

## 2024-04-08 LAB — CBC
HCT: 40.1 % (ref 36.0–46.0)
Hemoglobin: 12.6 g/dL (ref 12.0–15.0)
MCH: 27.2 pg (ref 26.0–34.0)
MCHC: 31.4 g/dL (ref 30.0–36.0)
MCV: 86.6 fL (ref 80.0–100.0)
Platelets: 444 10*3/uL — ABNORMAL HIGH (ref 150–400)
RBC: 4.63 MIL/uL (ref 3.87–5.11)
RDW: 16.7 % — ABNORMAL HIGH (ref 11.5–15.5)
WBC: 10.2 10*3/uL (ref 4.0–10.5)
nRBC: 0 % (ref 0.0–0.2)

## 2024-04-08 LAB — CK: Total CK: 11 U/L — ABNORMAL LOW (ref 38–234)

## 2024-04-08 MED ORDER — SODIUM CHLORIDE 0.9 % IV SOLN
2.0000 g | Freq: Two times a day (BID) | INTRAVENOUS | Status: DC
  Administered 2024-04-08 – 2024-04-12 (×9): 2 g via INTRAVENOUS
  Filled 2024-04-08 (×9): qty 12.5

## 2024-04-08 MED ORDER — OXYCODONE HCL 5 MG PO TABS: 2.5000 mg | ORAL_TABLET | ORAL | 0 refills | Status: DC | PRN

## 2024-04-08 MED ORDER — APIXABAN 2.5 MG PO TABS
2.5000 mg | ORAL_TABLET | Freq: Two times a day (BID) | ORAL | Status: DC
  Administered 2024-04-08 – 2024-04-12 (×9): 2.5 mg via ORAL
  Filled 2024-04-08 (×9): qty 1

## 2024-04-08 NOTE — Progress Notes (Signed)
 [facility]    Date of Admission:  04/07/2024   Total days of antibiotics 2          ID: [name] [name] is a 83 y.o. female with  right leg deep tissue infection with HW involvement s/p removal Principal Problem:   Exposed orthopaedic hardware (HCC)    Subjective: Afebrile.   Has many questions about why she is having recurrent uti  Medications:   amiodarone   200 mg Oral Daily   amitriptyline   10 mg Oral QHS   apixaban   2.5 mg Oral BID   carvedilol   3.125 mg Oral BID WC   docusate sodium   100 mg Oral BID   feeding supplement  1 Container Oral Daily   Gerhardt's butt cream   Topical BID   hydrALAZINE   25 mg Oral BID   hydroxyurea   500 mg Oral Once per day on Monday Wednesday Friday   mirtazapine   15 mg Oral QHS   pantoprazole   80 mg Oral Daily   pneumococcal 20-valent conjugate vaccine  0.5 mL Intramuscular Tomorrow-1000   sodium chloride  flush  3-10 mL Intravenous Q12H    Objective: Vital signs in last 24 hours: Temp:  [97.8 F (36.6 C)-98.2 F (36.8 C)] 98 F (36.7 C) (06/13 0758) Pulse Rate:  [63-84] 76 (06/13 1203) Resp:  [17-18] 17 (06/13 0758) BP: (106-124)/(46-57) 116/52 (06/13 1203) SpO2:  [96 %-100 %] 100 % (06/13 1203)  Physical Exam  Constitutional:  oriented to person, place, and time. appears well-developed and well-nourished. No distress.  HENT: [place]/AT, PERRLA, no scleral icterus Mouth/Throat: Oropharynx is clear and moist. No oropharyngeal exudate.  Cardiovascular: Normal rate, regular rhythm and normal heart sounds. Exam reveals no gallop and no friction rub.  No murmur heard.  Pulmonary/Chest: Effort normal and breath sounds normal. No respiratory distress.  has no wheezes.  Neck = supple, no nuchal rigidity Abdominal: Soft. Bowel sounds are normal.  exhibits no distension. There is no tenderness.  Ext: right left wrapped from surgery Skin: Skin is warm and dry. No rash noted. No erythema.  Psychiatric: a normal  mood and affect.  behavior is normal.    Lab Results Recent Labs    04/07/24 0800 04/08/24 0634  WBC 9.1 10.2  HGB 13.6 12.6  HCT 43.9 40.1  NA 132* 132*  K 4.2 4.0  CL 100 100  CO2 23 22  BUN 11 15  CREATININE 1.19* 1.14*    Sedimentation Rate Recent Labs    04/07/24 1843  ESRSEDRATE 45*   C-Reactive Protein Recent Labs    04/07/24 1843  CRP 6.4*    Microbiology: 6/12 pseudomonas Studies/Results: DG Ankle Complete Right Result Date: 04/07/2024 CLINICAL DATA:  Right ankle hardware removal EXAM: RIGHT ANKLE - COMPLETE 4 VIEW COMPARISON:  Right ankle radiograph dated 02/15/2024 FINDINGS: Four fluoroscopic images obtained during right ankle hardware removal. 17 seconds fluoro time utilized. Radiation dose 0.33 mGy Kerma. Please see performing physicians operative report for full details. IMPRESSION: Fluoroscopic images were obtained for intraoperative guidance of right ankle hardware removal. Electronically Signed   By: Limin  Xu M.D.   On: 04/07/2024 11:05   DG C-Arm 1-60 Min-No Report Result Date: 04/07/2024 Fluoroscopy was utilized by the requesting physician.  No radiographic interpretation.     Assessment/Plan: Pseudomonal deep tissue infection = will d/c ceftriaxone  and change to cefepime. Continue on daptomycin  until cx are finalized.  Hx of recurrent uti = currently not having symptoms.  [name] [name]  [facility] Pager: [phone]  04/08/2024, 3:06 PM

## 2024-04-08 NOTE — Plan of Care (Signed)
   Problem: Education: Goal: Knowledge of General Education information will improve Description: Including pain rating scale, medication(s)/side effects and non-pharmacologic comfort measures Outcome: Progressing   Problem: Health Behavior/Discharge Planning: Goal: Ability to manage health-related needs will improve Outcome: Progressing   Problem: Nutrition: Goal: Adequate nutrition will be maintained Outcome: Progressing   Problem: Elimination: Goal: Will not experience complications related to bowel motility Outcome: Progressing Goal: Will not experience complications related to urinary retention Outcome: Progressing

## 2024-04-08 NOTE — Progress Notes (Signed)
 Orthopaedic Trauma Progress Note  SUBJECTIVE: Doing okay this morning.  Notes her nerve block starting to wear off.  Just asked for pain medication.  Not been up out of bed yet since surgery.  Tolerating the splint well. No chest pain. No SOB. No nausea/vomiting.  Tolerating diet and fluids but has not had much of an appetite.  No other issues of note.  Husband at bedside.   Intraoperative cultures showing Pseudomonas.  OBJECTIVE:  Vitals:   04/08/24 0016 04/08/24 0758  BP: (!) 106/57 (!) 118/46  Pulse: 83 84  Resp: 18 17  Temp: 98.2 F (36.8 C) 98 F (36.7 C)  SpO2: 96% 100%    Opiates Today (MME): Today's  total administered Morphine  Milligram Equivalents: 0 Opiates Yesterday (MME): Yesterday's total administered Morphine  Milligram Equivalents: 7.5  General: Sitting up in bed, no acute distress.  Pleasant and cooperative Respiratory: No increased work of breathing.  Operative Extremity (right lower extremity): Well-padded, well-fitting splint in place.  Nontender above splint.  Neuro sensation light touch over the toes.  Toes warm well-perfused.  Remainder of motor and sensory exam for the foot and ankle limited secondary to splint placement.  IMAGING: Intraoperative imaging demonstrates all hardware has been removed.  Fracture well aligned.  LABS:  Results for orders placed or performed during the hospital encounter of 04/07/24 (from the past 24 hours)  Aerobic/Anaerobic Culture w Gram Stain (surgical/deep wound)     Status: None (Preliminary result)   Collection Time: 04/07/24 10:38 AM   Specimen: PATH Soft tissue  Result Value Ref Range   Specimen Description TISSUE    Special Requests right ankle tissue 1    Gram Stain      ABUNDANT WBC PRESENT, PREDOMINANTLY PMN NO ORGANISMS SEEN    Culture      CULTURE REINCUBATED FOR BETTER GROWTH Performed at [facility], [address], [place], [place] [address]    Report Status PENDING   Aerobic/Anaerobic Culture w Gram  Stain (surgical/deep wound)     Status: None (Preliminary result)   Collection Time: 04/07/24 10:41 AM   Specimen: PATH Soft tissue  Result Value Ref Range   Specimen Description TISSUE    Special Requests right ankle 2    Gram Stain      FEW WBC PRESENT, PREDOMINANTLY PMN RARE GRAM POSITIVE COCCI    Culture      CULTURE REINCUBATED FOR BETTER GROWTH Performed at [facility], [address] [place], [place] [address]    Report Status PENDING   Aerobic/Anaerobic Culture w Gram Stain (surgical/deep wound)     Status: None (Preliminary result)   Collection Time: 04/07/24 10:41 AM   Specimen: PATH Soft tissue  Result Value Ref Range   Specimen Description TISSUE    Special Requests right ankle 3    Gram Stain      RARE WBC PRESENT, PREDOMINANTLY PMN NO ORGANISMS SEEN    Culture      CULTURE REINCUBATED FOR BETTER GROWTH Performed at [facility], [address] [place], [place] [address]    Report Status PENDING   Glucose, capillary     Status: Abnormal   Collection Time: 04/07/24 12:35 PM  Result Value Ref Range   Glucose-Capillary 104 (H) 70 - 99 mg/dL  MRSA Next Gen by PCR, Nasal     Status: None   Collection Time: 04/07/24  3:28 PM   Specimen: Nasal Mucosa; Nasal Swab  Result Value Ref Range  MRSA by PCR Next Gen NOT DETECTED NOT DETECTED  Sedimentation rate     Status: Abnormal   Collection Time: 04/07/24  6:43 PM  Result Value Ref Range   Sed Rate 45 (H) 0 - 22 mm/hr  C-reactive protein     Status: Abnormal   Collection Time: 04/07/24  6:43 PM  Result Value Ref Range   CRP 6.4 (H) <1.0 mg/dL    ASSESSMENT: [name] [name] is a 83 y.o. female, 1 Day Post-Op s/p IRRIGATION AND DEBRIDEMENT RIGHT ANKLE WOUND WITH REMOVAL OF HARDWARE   CV/Blood loss: Acute blood loss anemia, Hgb 12.6 this AM. Hemodynamically stable  PLAN: Weightbearing: NWB RLE ROM: Okay for knee motion.  Maintain splint Incisional and dressing care: Dressings left intact until  follow-up  Showering: Okay to shower, keep splint covered and dry Orthopedic device(s): Splint RLE Pain management: Continue multimodal pain control, minimize narcotics as able VTE prophylaxis: Home dose Eliquis , SCDs ID: Ceftriaxone  and daptomycin  Foley/Lines:  No foley, KVO IVFs Impediments to Fracture Healing: Vitamin D  level 10 checked April 2025, continue supplementation Dispo: PT/OT evaluation today.  Appreciate assistance from ID team with antibiotic management.  Plan for discharge back to SNF once final antibiotic recommendations have been made.    D/C recommendations: - Tylenol  and oxycodone  for pain control - Home dose Eliquis  for DVT prophylaxis - Continue Vit D supplementation  Follow - up plan: 2 weeks after d/c for wound check and repeat x-rays   Contact information:  [name] Pane MD, [name] Jamaica PA-C. After hours and holidays please check [email or web address] for group call information for [facility]   [name] Gordon, PA-C [phone] (office) [email or web address]

## 2024-04-08 NOTE — Evaluation (Signed)
 Occupational Therapy Evaluation Patient Details Name: [name] [name] MRN: [id number] DOB: 12/28/1940 Today's Date: 04/08/2024   History of Present Illness   Pt is an 83 y/o female presenting to OTS clinic from SNF on 04/05/24 w/ exposed hardware of right medial ankle following ORIF of distal tibia on 02/15/24. Pt is s/p hardware removal w/ I&D of ankle wound on 04/07/24. PMH - HTN, ckd, chf, cad, TKA, lumbar fusion, rt hip fx with hemiarthroplasty.     Clinical Impressions Prior to initial ankle injury in 01/2024, pt was Independent to Set up for ADLs and Mod I for functional mobility with a Rollator or manual wheelchair. Just prior to this admission while in short-term rehab, pt was requiring Set up to Max assist for ADLs and assist for functional transfers with a sliding board. Pt now presents with decreased activity tolerance, pain affecting functional level, decreased balance, generalized B UE weakness, signs anxiety with mobility or discussion of mobility, and decreased safety and independence with functional tasks. Pt currently demonstrates ability to complete UB ADLs with Set up to Min assist from bed level, LB ADLs with Max assist from bed level, and rolling L/R in the bed during functional tasks with Min to Mod assist. Pt participated well, but limited this session by pain and signs of anxiety related to mobility. Pt reported no dizziness, lightheadedness, or nausea this session. Pt will benefit from acute skilled OT services to address deficits outlined below and increase safety and independence with functional tasks. Post acute discharge, OT recommends continued intensive inpatient skilled rehab services < 3 hours per day to maximize rehab potential.      If plan is discharge home, recommend the following:   A lot of help with walking and/or transfers;A lot of help with bathing/dressing/bathroom;Assistance with cooking/housework;Assist for transportation;Help with stairs or ramp for  entrance     Functional Status Assessment   Patient has had a recent decline in their functional status and demonstrates the ability to make significant improvements in function in a reasonable and predictable amount of time.     Equipment Recommendations   Other (comment) (defer to next level of care)     Recommendations for Other Services         Precautions/Restrictions   Precautions Precautions: Fall Recall of Precautions/Restrictions: Intact Required Braces or Orthoses: Splint/Cast Splint/Cast: RLE Splint/Cast - Date Prophylactic Dressing Applied (if applicable): 04/07/24 Restrictions Weight Bearing Restrictions Per Provider Order: Yes RLE Weight Bearing Per Provider Order: Non weight bearing     Mobility Bed Mobility Overal bed mobility: Needs Assistance Bed Mobility: Rolling Rolling: Min assist, Used rails         General bed mobility comments: assist for B LE and cues for hand placement/technique; OT also educated pt and her husband in use and benefits of chair function in teh bed with pt agreeable to sitting in chair position during session with pt reporting no dizziness, lightheadness, or increased pain when sitting in chair position in bed    Transfers Overall transfer level: Needs assistance                 General transfer comment: Pt declined sitting EOB or OOB transfers this session due to nausea, dizziness, and lightheadedness sitting EOB with PT earlier this day      Balance Overall balance assessment: Needs assistance Sitting-balance support: Bilateral upper extremity supported, Feet supported Sitting balance-Leahy Scale: Fair Sitting balance - Comments: Pt able to use bed rails to pull herself into long  sitting in bed x5 and hold position with use of B UE with Supervision   Standing balance support:  (deferred)                               ADL either performed or assessed with clinical judgement   ADL Overall  ADL's : Needs assistance/impaired Eating/Feeding: Set up;Bed level   Grooming: Set up;Bed level   Upper Body Bathing: Minimal assistance;Bed level;Cueing for compensatory techniques   Lower Body Bathing: Maximal assistance;Bed level;Cueing for compensatory techniques   Upper Body Dressing : Minimal assistance;Bed level   Lower Body Dressing: Maximal assistance;Bed level     Toilet Transfer Details (indicate cue type and reason): Pt declined sitting EOB or OOB transfers this session due to nausea, dizziness, and lightheadedness sitting EOB with PT earlier this day Toileting- Clothing Manipulation and Hygiene: Maximal assistance;Bed level         General ADL Comments: Pt with decreased activity tolerance and anxiety related to mobility and increased pain affecting functional level.     Vision Baseline Vision/History: 1 Wears glasses Ability to See in Adequate Light: 0 Adequate (with glasses) Patient Visual Report: No change from baseline       Perception         Praxis         Pertinent Vitals/Pain Pain Assessment Pain Assessment: 0-10 Pain Score: 9  Pain Location: RLE Pain Descriptors / Indicators: Discomfort, Grimacing, Guarding, Moaning Pain Intervention(s): Limited activity within patient's tolerance, Monitored during session, Premedicated before session, Repositioned     Extremity/Trunk Assessment Upper Extremity Assessment Upper Extremity Assessment: Right hand dominant;Generalized weakness   Lower Extremity Assessment Lower Extremity Assessment: Defer to PT evaluation   Cervical / Trunk Assessment Cervical / Trunk Assessment: Kyphotic   Communication Communication Communication: No apparent difficulties   Cognition Arousal: Alert Behavior During Therapy: WFL for tasks assessed/performed, Anxious (Largely WFL with signs of anxiety when OT requesting/pt participating in bed mobility and when discussing participation in OT/PT services at SNF) Cognition:  No apparent impairments             OT - Cognition Comments: Pt is AAOx4 and pleasant throughout session. Cognition appears [facility] for tasks assessed. Cognition not formally assessed.                 Following commands: Intact       Cueing  General Comments   Cueing Techniques: Verbal cues;Gestural cues;Visual cues  RN present during a portion of session. Pt's husband present throughout session.   Exercises Exercises: Other exercises Other Exercises Other Exercises: Bringing self into long sitting in the bed using B UE and bed rails to pull up; strengthening; 5 reps   Shoulder Instructions      Home Living Family/patient expects to be discharged to:: Skilled nursing facility Living Arrangements: Spouse/significant other Available Help at Discharge: Family;Available 24 hours/day Type of Home: House Home Access: Ramped entrance     Home Layout: One level     Bathroom Shower/Tub: Walk-in shower;Curtain   Bathroom Toilet: Handicapped height Bathroom Accessibility: No   Home Equipment: Scientist, research (medical) (4 wheels);Shower seat - built in;Hand held shower head;BSC/3in1;Grab bars - tub/shower   Additional Comments: Housing set-up above based on pt's home. However, pt presents to [facility] from [facility] where she was receiving short-term rehab and pt expects to return to short-term rehab upon acute discharge      Prior Functioning/Environment Prior Level  of Function : Independent/Modified Independent;Needs assist;History of Falls (last six months)             Mobility Comments: Prior to initial ankle injury in 01/2024, pt was using rollator vs WC with Mod I, typically using a WC in the community and rollator around the house. Just prior to this admission, pt reports working on slideboard transfers and standing EOB at SNF. ADLs Comments: Prior to initial ankle injury in 01/2024, pt was Independent to Set up with ADLs. Just prior to this admission, pt reprots she was  Set up to Min assist for UB ADLs and Mod to Max assist with LB ADLs. Pt reports staff at SNF were not bathing (in bed or shower) pt regularly with pt reporting sponge baths approx. 1-2x per week. Pt also with a hx of UTIs with UTI contributing to fall that led to initial ankle injury. OT encouraged pt and her husband to request more frequent baths (minimum every other day, in bed or shower) for pt at SNF to decrease risk of skin breakdown or UTIs.    OT Problem List: Decreased strength;Decreased activity tolerance;Impaired balance (sitting and/or standing);Pain   OT Treatment/Interventions: Self-care/ADL training;Therapeutic exercise;Energy conservation;DME and/or AE instruction;Therapeutic activities;Patient/family education;Balance training      OT Goals(Current goals can be found in the care plan section)   Acute Rehab OT Goals Patient Stated Goal: to have less pain, heal well, and be able to return home OT Goal Formulation: With patient/family Time For Goal Achievement: 04/22/24 Potential to Achieve Goals: Good ADL Goals Pt Will Perform Grooming: with supervision;sitting (sitting EOB with Good sitting balance for 3 or more minutes) Pt Will Perform Lower Body Dressing: with min assist;with adaptive equipment;sitting/lateral leans;sit to/from stand (dressing L LE; with additional assist for R LE as needed) Pt Will Transfer to Toilet: with contact guard assist (lateral scoot transfer to/from drop-arm bedside commode) Pt/caregiver will Perform Home Exercise Program: Increased strength;Both right and left upper extremity;With theraband;With Supervision;With written HEP provided Additional ADL Goal #1: Patient will demonstrate understanding through teach back of education in breathing, mediation/visualization, and sensory-based techniques for improved pain management and stress management.   OT Frequency:  Min 2X/week    Co-evaluation              AM-PAC OT 6 Clicks Daily Activity      Outcome Measure Help from another person eating meals?: A Little Help from another person taking care of personal grooming?: A Little Help from another person toileting, which includes using toliet, bedpan, or urinal?: A Lot Help from another person bathing (including washing, rinsing, drying)?: A Lot Help from another person to put on and taking off regular upper body clothing?: A Little Help from another person to put on and taking off regular lower body clothing?: A Lot 6 Click Score: 15   End of Session Nurse Communication: Mobility status;Other (comment) (IV beeping; OT educated pt and husband in use of chair funciton in bed and pt tolerated well)  Activity Tolerance: Patient tolerated treatment well;Patient limited by pain;Other (comment) (Pt limited by signs of anxiety with mobility/talk of mobility) Patient left: in bed;with call bell/phone within reach;with family/visitor present  OT Visit Diagnosis: Other abnormalities of gait and mobility (R26.89);Muscle weakness (generalized) (M62.81);History of falling (Z91.81);Pain;Other (comment) (decreased activity tolerance)                Time: 1530-1605 OT Time Calculation (min): 35 min Charges:  OT General Charges $OT Visit: 1 Visit OT Evaluation $OT  Eval Low Complexity: 1 Low OT Treatments $Self Care/Home Management : 8-22 mins  Ronnell Coins., OTR/L, MA Acute Rehab [phone]   Walt Gunner 04/08/2024, 6:00 PM

## 2024-04-08 NOTE — Discharge Instructions (Addendum)
 [name], MD [name] PA-C [facility] [address] [phone] Theone Fitting)   [phone] (fax)                                  POST-OPERATIVE INSTRUCTIONS     WEIGHT BEARING STATUS: Nonweightbearing right lower extremity  RANGE OF MOTION/ACTIVITY: Okay for knee motion.  Maintain splint  WOUND CARE Please keep splint clean dry and intact until follow-up. If your splint gets wet for any reason please contact the office immediately.  Do not stick anything down your splint such as pencils, momey, hangers to try and scratch yourself.  If you feel itchy take Benadryl  as prescribed on the bottle for itching You may shower on Post-Op Day #2.  You must keep splint dry during this process and may find that a plastic bag taped around the extremity or alternatively a towel based bath may be a better option.   If you get your splint wet or if it is damaged please contact our clinic.  EXERCISES Due to your splint being in place you will not be able to bear weight through your extremity.   DO NOT PUT ANY WEIGHT ON YOUR OPERATIVE LEG Please use crutches or a walker to avoid weight bearing.   DVT/PE prophylaxis: Eliquis   DIET: As you were eating previously.  Can use over the counter stool softeners and bowel preparations, such as Miralax , to help with bowel movements.  Narcotics can be constipating.  Be sure to drink plenty of fluids  REGIONAL ANESTHESIA (NERVE BLOCKS) The anesthesia team may have performed a nerve block for you if safe in the setting of your care.  This is a great tool used to minimize pain.  Typically the block may start wearing off overnight but the long acting medicine may last for 3-4 days.  The nerve block wearing off can be a challenging period but please utilize your as needed pain medications to try and manage this period.    POST-OP MEDICATIONS- Multimodal approach to pain control  In general your pain will be controlled with a  combination of substances.  Prescriptions unless otherwise discussed are electronically sent to your pharmacy.  This is a carefully made plan we use to minimize narcotic use.     - Acetaminophen  - Non-narcotic pain medicine taken on a scheduled basis   - Oxycodone  - This is a strong narcotic, to be used only on an as needed basis for pain.  -  Eliquis  - This medicine is used to minimize the risk of blood clots after surgery.  FOLLOW-UP If you develop a Fever (>101.5), Redness or Drainage from the surgical incision site, please call our office to arrange for an evaluation. Please call the office to schedule a follow-up appointment for your incision check if you do not already have one, 7-10 days post-operatively.   VISIT OUR WEBSITE FOR ADDITIONAL INFORMATION: [email or web address]   HELPFUL INFORMATION  If you had a block, it will wear off between 8-24 hrs postop typically.  This is period when your pain may go from nearly zero to the pain you would have had postop without the block.  This is an abrupt transition but nothing dangerous is happening.  You may take an extra dose of narcotic when this happens.  You should wean off your narcotic medicines as soon as you are able.  Most patients will be off or using minimal narcotics  before their first postop appointment.    Do not drink alcoholic beverages or take illicit drugs when taking pain medications.   Pain medication may make you constipated.  Below are a few solutions to try in this order: Decrease the amount of pain medication if you aren't having pain. Drink lots of decaffeinated fluids. Drink prune juice and/or each dried prunes  If the first 3 don't work start with additional solutions Take Colace - an over-the-counter stool softener Take Senokot - an over-the-counter laxative Take Miralax  - a stronger over-the-counter laxative

## 2024-04-08 NOTE — Evaluation (Signed)
 Physical Therapy Evaluation Patient Details Name: [name] [name] MRN: [id number] DOB: [birth year] Today's Date: 04/08/2024  History of Present Illness  Pt is an 83 y/o female presenting to OTS clinic from SNF on 04/05/24 w/ exposed hardware of right medial ankle following ORIF of distal tibia on 02/15/24. Pt is s/p hardware removal w/ I&D of ankle wound on 04/07/24. PMH - HTN, ckd, chf, cad, TKA, lumbar fusion, rt hip fx with hemiarthroplasty.   Clinical Impression  Pt presents to evaluation with deficits in strength, mobility, power, balance, and pain, all limiting patient's ability to safely mobilize. Pt was able to perform bed mobility with minimal physical assistance for RLE management and attempted slide board transfer to the R. Pt reported significant nausea and increased anxiety while attempting to transfer, requesting to return to bed immediately. Further progression of mobilization deferred due to reason above. PT educated patient on the importance of early mobilization to prevent medical complications. PT will continue to treat pt while she is admitted. Patient will benefit from continued inpatient follow up therapy, <3 hours/day       If plan is discharge home, recommend the following: A lot of help with walking and/or transfers;A lot of help with bathing/dressing/bathroom;Assistance with cooking/housework;Assist for transportation;Help with stairs or ramp for entrance   Can travel by private vehicle   No    Equipment Recommendations None recommended by PT  Recommendations for Other Services       Functional Status Assessment Patient has had a recent decline in their functional status and demonstrates the ability to make significant improvements in function in a reasonable and predictable amount of time.     Precautions / Restrictions Precautions Precautions: Fall Recall of Precautions/Restrictions: Intact Required Braces or Orthoses: Splint/Cast Splint/Cast: RLE Splint/Cast  - Date Prophylactic Dressing Applied (if applicable): 04/07/24 Restrictions Weight Bearing Restrictions Per Provider Order: Yes RLE Weight Bearing Per Provider Order: Non weight bearing      Mobility  Bed Mobility Overal bed mobility: Needs Assistance Bed Mobility: Supine to Sit, Sit to Supine     Supine to sit: Min assist, HOB elevated, Used rails Sit to supine: Min assist, HOB elevated, Used rails   General bed mobility comments: physical assistance for LE management; increased time to complete.    Transfers Overall transfer level: Needs assistance Equipment used: Sliding board Transfers: Bed to chair/wheelchair/BSC            Lateral/Scoot Transfers: Contact guard assist, With slide board General transfer comment: pt attempted slideboard transfer obtaining 1', but unable to progress to chair due to signficiant nausea and anxiety with mobilization. VC given for sequencing; increased time and effort to complete.    Ambulation/Gait                  Stairs            Wheelchair Mobility     Tilt Bed    Modified Rankin (Stroke Patients Only)       Balance Overall balance assessment: Needs assistance Sitting-balance support: Bilateral upper extremity supported, Feet supported Sitting balance-Leahy Scale: Fair Sitting balance - Comments: pt able to sit EOB for ~2 minutes with BUE support   Standing balance support:  (deferred)                                 Pertinent Vitals/Pain Pain Assessment Pain Assessment: 0-10 Pain Score: 10-Worst pain ever Pain Location: RLE Pain  Descriptors / Indicators: Discomfort, Grimacing, Guarding, Moaning Pain Intervention(s): Limited activity within patient's tolerance, Monitored during session, Premedicated before session    Home Living Family/patient expects to be discharged to:: Private residence Living Arrangements: Spouse/significant other Available Help at Discharge: Family;Available 24  hours/day Type of Home: House Home Access: Ramped entrance       Home Layout: One level Home Equipment: Scientist, research (medical) (4 wheels);Shower seat - built in;Hand held shower head;BSC/3in1;Grab bars - tub/shower Additional Comments: pt plans to return to SNF at D/C    Prior Function Prior Level of Function : Needs assist;History of Falls (last six months)             Mobility Comments: prior to initial ankle injury, using rollator vs WC, most recently WC; rollator around the house. Pt reports working on slideboard transfers and standing EOB at SNF. ADLs Comments: physical assistance required for setup     Extremity/Trunk Assessment   Upper Extremity Assessment Upper Extremity Assessment: Defer to OT evaluation    Lower Extremity Assessment Lower Extremity Assessment: Generalized weakness    Cervical / Trunk Assessment Cervical / Trunk Assessment: Kyphotic  Communication   Communication Communication: No apparent difficulties    Cognition Arousal: Alert Behavior During Therapy: Anxious   PT - Cognitive impairments: Problem solving, Safety/Judgement                         Following commands: Intact       Cueing Cueing Techniques: Verbal cues, Gestural cues, Visual cues     General Comments General comments (skin integrity, edema, etc.): pt reports dizziness while sitting EOB and increased nausea requesting to lay back down    Exercises General Exercises - Lower Extremity Long Arc Quad: AROM, Left, 5 reps   Assessment/Plan    PT Assessment Patient needs continued PT services  PT Problem List Decreased strength;Decreased range of motion;Decreased activity tolerance;Decreased balance;Decreased mobility;Decreased coordination;Decreased knowledge of use of DME;Decreased safety awareness;Pain       PT Treatment Interventions DME instruction;Gait training;Stair training;Functional mobility training;Therapeutic activities;Therapeutic  exercise;Balance training;Neuromuscular re-education;Patient/family education;Wheelchair mobility training;Manual techniques;Modalities    PT Goals (Current goals can be found in the Care Plan section)  Acute Rehab PT Goals Patient Stated Goal: improved pain PT Goal Formulation: With patient/family Time For Goal Achievement: 04/22/24 Potential to Achieve Goals: Fair    Frequency Min 2X/week     Co-evaluation               AM-PAC PT 6 Clicks Mobility  Outcome Measure Help needed turning from your back to your side while in a flat bed without using bedrails?: A Little Help needed moving from lying on your back to sitting on the side of a flat bed without using bedrails?: A Little Help needed moving to and from a bed to a chair (including a wheelchair)?: A Little Help needed standing up from a chair using your arms (e.g., wheelchair or bedside chair)?: A Lot Help needed to walk in hospital room?: Total Help needed climbing 3-5 steps with a railing? : Total 6 Click Score: 13    End of Session Equipment Utilized During Treatment: Gait belt Activity Tolerance: Other (comment) (anxiety; nausea) Patient left: in bed;with call bell/phone within reach;with bed alarm set;with family/visitor present Nurse Communication: Mobility status;Other (comment) (pt requesting nausea medication) PT Visit Diagnosis: Other abnormalities of gait and mobility (R26.89);Muscle weakness (generalized) (M62.81);History of falling (Z91.81);Pain Pain - Right/Left: Right Pain - part of body:  Leg    Time: 1101-1135 PT Time Calculation (min) (ACUTE ONLY): 34 min   Charges:   PT Evaluation $PT Eval Low Complexity: 1 Low   PT General Charges $$ ACUTE PT VISIT: 1 Visit         Lonell Rives, SPT Acute Rehab [phone]   Lonell Rives 04/08/2024, 2:27 PM

## 2024-04-09 ENCOUNTER — Other Ambulatory Visit: Payer: Self-pay

## 2024-04-09 DIAGNOSIS — R7989 Other specified abnormal findings of blood chemistry: Secondary | ICD-10-CM

## 2024-04-09 DIAGNOSIS — T84498A Other mechanical complication of other internal orthopedic devices, implants and grafts, initial encounter: Secondary | ICD-10-CM | POA: Diagnosis not present

## 2024-04-09 DIAGNOSIS — D72829 Elevated white blood cell count, unspecified: Secondary | ICD-10-CM | POA: Diagnosis not present

## 2024-04-09 DIAGNOSIS — B965 Pseudomonas (aeruginosa) (mallei) (pseudomallei) as the cause of diseases classified elsewhere: Secondary | ICD-10-CM | POA: Diagnosis not present

## 2024-04-09 LAB — CBC
HCT: 39 % (ref 36.0–46.0)
Hemoglobin: 12.2 g/dL (ref 12.0–15.0)
MCH: 27.5 pg (ref 26.0–34.0)
MCHC: 31.3 g/dL (ref 30.0–36.0)
MCV: 88 fL (ref 80.0–100.0)
Platelets: 395 10*3/uL (ref 150–400)
RBC: 4.43 MIL/uL (ref 3.87–5.11)
RDW: 16.7 % — ABNORMAL HIGH (ref 11.5–15.5)
WBC: 13.5 10*3/uL — ABNORMAL HIGH (ref 4.0–10.5)
nRBC: 0 % (ref 0.0–0.2)

## 2024-04-09 MED ORDER — SODIUM CHLORIDE 1 G PO TABS
1.0000 g | ORAL_TABLET | Freq: Two times a day (BID) | ORAL | Status: DC
  Administered 2024-04-09 – 2024-04-12 (×7): 1 g via ORAL
  Filled 2024-04-09 (×7): qty 1

## 2024-04-09 NOTE — NC FL2 (Signed)
 [place]  MEDICAID [id number] LEVEL OF CARE FORM     IDENTIFICATION  Patient Name: [name] [name] Birthdate: [birth year] Sex: female Admission Date (Current Location): 04/07/2024  [facility] and [place] Number:  Producer, television/film/video and Address:  [facility] [place], [address], [place], [place] [address]      Provider Number: [id number]  Attending Physician Name and Address:  Laneta Pintos, MD  Relative Name and Phone Number:  Kattie Santoyo (spouse) [phone]    Current Level of Care: Hospital Recommended Level of Care: Skilled Nursing Facility Prior Approval Number:    Date Approved/Denied:   PASRR Number: [id number]  Discharge Plan: SNF    Current Diagnoses: Patient Active Problem List   Diagnosis Date Noted   Exposed orthopaedic hardware (HCC) 04/07/2024   NSVT (nonsustained ventricular tachycardia) (HCC) 02/11/2024   Closed fracture of distal end of right fibula and tibia 02/11/2024   Sepsis secondary to UTI (HCC) 02/11/2024   Complicated UTI (urinary tract infection) 03/31/2021   Cancer (HCC)    Complication of anesthesia    Hypertension    Essential thrombocythemia (HCC) 03/04/2021   Ventricular ectopy 12/28/2020   Presence of pessary    Wears glasses    Uses walker    Right wrist fracture    PAF (paroxysmal atrial fibrillation) (HCC)    Lumbar compression fracture (HCC)    Iron deficiency anemia    Hyperlipidemia    History of uterine cancer    History of septic shock    History of peptic ulcer disease    History of kidney infection    History of kidney stones    History of gastric polyp    History of colon polyps    History of arteriovenostomy for renal dialysis (HCC)    Hiatal hernia    Hematuria    GERD (gastroesophageal reflux disease)    General weakness    First degree heart block    Cystocele, midline    Chronic diastolic (congestive) heart failure (HCC)    Anticoagulant long-term use    Nonfunctioning kidney  03/23/2020   Urinary tract infection without hematuria    Occlusion of ureteral stent (HCC)    Chronic anticoagulation 08/04/2017   Coronary artery disease 06/16/2017   Encounter for monitoring flecainide  therapy 07/30/2016   Ureteral stricture, right 05/07/2016   Sepsis (HCC) 05/07/2016   Hydronephrosis 05/07/2016   Diverticulosis 05/07/2016   Abdominal aortic atherosclerosis (HCC) 05/07/2016   Congenital vaginal enterocele 04/15/2016   Female genital prolapse 04/15/2016   High risk medication use 04/15/2016   Hyperlipemia 04/15/2016   Midline cystocele 04/15/2016   Palpitations 04/15/2016   Tendonitis, Achilles 04/15/2016   Paroxysmal atrial fibrillation (HCC) 05/31/2015   Chronic diastolic congestive heart failure (HCC) 05/31/2015   Dyslipidemia 05/31/2015   Vomiting and diarrhea 06/11/2013   E coli bacteremia 06/08/2013   Anemia, unspecified 06/08/2013   NSTEMI, initial episode of care (HCC) 06/07/2013   Septic shock (HCC) 06/04/2013   Acute respiratory failure with hypoxia (HCC) 06/04/2013   Pyelonephritis of left kidney 06/04/2013   Ureteral obstruction, right 06/04/2013   Acute renal failure superimposed on stage 3b chronic kidney disease (HCC) 06/04/2013   Lactic acidosis 06/04/2013   History of ventilator dependency 2014   Chronic kidney disease 2014    Orientation RESPIRATION BLADDER Height & Weight     Self, Time, Situation, Place  Normal Incontinent, External catheter (External Urinary Catheter) Weight: 167 lb (75.8 kg) Height:  5' 7 (170.2  cm)  BEHAVIORAL SYMPTOMS/MOOD NEUROLOGICAL BOWEL NUTRITION STATUS      Incontinent Diet (Please see discharge summary)  AMBULATORY STATUS COMMUNICATION OF NEEDS Skin   Extensive Assist Verbally Other (Comment) (Wound/Incision LDAs,Wound surgical,closed,surgical incision,Leg,R)                       Personal Care Assistance Level of Assistance  Feeding, Bathing, Dressing Bathing Assistance: Maximum  assistance Feeding assistance: Limited assistance Dressing Assistance: Maximum assistance     Functional Limitations Info  Sight, Hearing, Speech Sight Info: Impaired Financial trader) Hearing Info: Adequate Speech Info: Adequate    SPECIAL CARE FACTORS FREQUENCY  PT (By licensed PT), OT (By licensed OT)     PT Frequency: 5x min weekly OT Frequency: 5x min weekly            Contractures Contractures Info: Not present    Additional Factors Info  Code Status, Allergies, Psychotropic, Isolation Precautions Code Status Info: FULL Allergies Info: Codeine,Hydrocodone  Psychotropic Info: mirtazapine  (REMERON ) tablet 15 mg daily at bedtime,amitriptyline  (ELAVIL ) tablet 10 mg daily at bedtime   Isolation Precautions Info: VRE     Current Medications (04/09/2024):  This is the current hospital active medication list Current Facility-Administered Medications  Medication Dose Route Frequency Provider Last Rate Last Admin   acetaminophen  (TYLENOL ) tablet 325-650 mg  325-650 mg Oral Q6H PRN Versie Gores, PA-C       albuterol  (PROVENTIL ) (2.5 MG/3ML) 0.083% nebulizer solution 3 mL  3 mL Inhalation Q4H PRN Jonelle Neri, Sarah A, PA-C       alum & mag hydroxide-simeth (MAALOX/MYLANTA) 200-200-20 MG/5ML suspension 30 mL  30 mL Oral Q4H PRN Jonelle Neri, Sarah A, PA-C       amiodarone  (PACERONE ) tablet 200 mg  200 mg Oral Daily [name] A, PA-C   200 mg at 04/09/24 0853   amitriptyline  (ELAVIL ) tablet 10 mg  10 mg Oral QHS [name] A, PA-C   10 mg at 04/08/24 2134   apixaban  (ELIQUIS ) tablet 2.5 mg  2.5 mg Oral BID Chen, Lydia D, RPH   2.5 mg at 04/09/24 9147   carvedilol  (COREG ) tablet 3.125 mg  3.125 mg Oral BID WC [name] A, PA-C   3.125 mg at 04/09/24 8295   ceFEPIme (MAXIPIME) 2 g in sodium chloride  0.9 % 100 mL IVPB  2 g Intravenous Q12H Liane Redman, MD 200 mL/hr at 04/09/24 0851 2 g at 04/09/24 0851   DAPTOmycin  (CUBICIN ) 600 mg in sodium chloride  0.9 % IVPB  8 mg/kg  Intravenous Q1400 Baldemar Lev, RPH 124 mL/hr at 04/08/24 1533 600 mg at 04/08/24 1533   docusate sodium  (COLACE) capsule 100 mg  100 mg Oral BID Versie Gores, PA-C   100 mg at 04/07/24 2109   feeding supplement (BOOST / RESOURCE BREEZE) liquid 1 Container  1 Container Oral Daily Versie Gores, PA-C   1 Container at 04/09/24 6213   furosemide  (LASIX ) tablet 20 mg  20 mg Oral Daily PRN Versie Gores, PA-C       Gerhardt's butt cream   Topical BID Haddix, Florentina Huntsman, MD   Given at 04/09/24 0855   hydrALAZINE  (APRESOLINE ) tablet 25 mg  25 mg Oral BID Versie Gores, PA-C   25 mg at 04/08/24 2133   hydroxyurea  (HYDREA ) capsule 500 mg  500 mg Oral Once per day on Monday Wednesday Friday Versie Gores, PA-C   500 mg at 04/08/24 0865   loperamide  (IMODIUM ) capsule 4 mg  4 mg Oral Q6H PRN Versie Gores, PA-C       methocarbamol  (ROBAXIN ) tablet 500 mg  500 mg Oral Q8H PRN Versie Gores, PA-C       metoCLOPramide  (REGLAN ) tablet 5-10 mg  5-10 mg Oral Q8H PRN Versie Gores, PA-C       Or   metoCLOPramide  (REGLAN ) injection 5-10 mg  5-10 mg Intravenous Q8H PRN Versie Gores, PA-C       mirtazapine  (REMERON ) tablet 15 mg  15 mg Oral QHS [name] A, PA-C   15 mg at 04/08/24 2134   ondansetron  (ZOFRAN ) tablet 4 mg  4 mg Oral Q6H PRN Versie Gores, PA-C       Or   ondansetron  (ZOFRAN ) injection 4 mg  4 mg Intravenous Q6H PRN Versie Gores, PA-C   4 mg at 04/08/24 1154   Oral care mouth rinse  15 mL Mouth Rinse PRN Haddix, Florentina Huntsman, MD       Oral care mouth rinse  15 mL Mouth Rinse PRN Haddix, Florentina Huntsman, MD       oxyCODONE  (Oxy IR/ROXICODONE ) immediate release tablet 2.5-5 mg  2.5-5 mg Oral Q4H PRN Versie Gores, PA-C   5 mg at 04/08/24 2134   pantoprazole  (PROTONIX ) EC tablet 80 mg  80 mg Oral Daily Versie Gores, PA-C   80 mg at 04/09/24 1610   pneumococcal 20-valent conjugate vaccine (PREVNAR 20) injection 0.5 mL  0.5 mL Intramuscular Tomorrow-1000 Haddix, Florentina Huntsman,  MD       senna-docusate (Senokot-S) tablet 2 tablet  2 tablet Oral Daily PRN Versie Gores, PA-C       sodium chloride  flush (NS) 0.9 % injection 3-10 mL  3-10 mL Intravenous Q12H Versie Gores, PA-C   10 mL at 04/09/24 9604   sodium chloride  flush (NS) 0.9 % injection 3-10 mL  3-10 mL Intravenous PRN Versie Gores, PA-C       sodium chloride  tablet 1 g  1 g Oral BID WC McClung, Sarah A, PA-C   1 g at 04/09/24 5409     Discharge Medications: Please see discharge summary for a list of discharge medications.  Relevant Imaging Results:  Relevant Lab Results:   Additional Information [id number]  Carmon Christen, LCSWA

## 2024-04-09 NOTE — Progress Notes (Signed)
 [facility]    Date of Admission:  04/07/2024   Total days of antibiotics 3   ID: [name] [name] is a 83 y.o. female with  right ankle wound with HW involvement POD#2 S/p I x D and all HW removal Principal Problem:   Exposed orthopaedic hardware (HCC)    Subjective: Afebrile Not having great oral intake  OR cx showing PsA. Senstivities pending Lab Results  Component Value Date   ESRSEDRATE 45 (H) 04/07/2024   Lab Results  Component Value Date   CRP 6.4 (H) 04/07/2024      Medications:   amiodarone   200 mg Oral Daily   amitriptyline   10 mg Oral QHS   apixaban   2.5 mg Oral BID   carvedilol   3.125 mg Oral BID WC   docusate sodium   100 mg Oral BID   feeding supplement  1 Container Oral Daily   Gerhardt's butt cream   Topical BID   hydrALAZINE   25 mg Oral BID   hydroxyurea   500 mg Oral Once per day on Monday Wednesday Friday   mirtazapine   15 mg Oral QHS   pantoprazole   80 mg Oral Daily   pneumococcal 20-valent conjugate vaccine  0.5 mL Intramuscular Tomorrow-1000   sodium chloride  flush  3-10 mL Intravenous Q12H   sodium chloride   1 g Oral BID WC    Objective: Vital signs in last 24 hours: Temp:  [98.3 F (36.8 C)-99.3 F (37.4 C)] 99 F (37.2 C) (06/14 0739) Pulse Rate:  [76-89] 83 (06/14 0739) Resp:  [18-20] 20 (06/14 0739) BP: (112-126)/(52-64) 112/59 (06/14 0739) SpO2:  [96 %-100 %] 96 % (06/14 0739)   Physical Exam  Constitutional:  oriented to person, place, and time. appears well-developed and well-nourished. No distress.  HENT: [place]/AT, PERRLA, no scleral icterus Mouth/Throat: Oropharynx is clear and moist. No oropharyngeal exudate.  Cardiovascular: Normal rate, regular rhythm and normal heart sounds. Exam reveals no gallop and no friction rub.  No murmur heard.  Pulmonary/Chest: Effort normal and breath sounds normal. No respiratory distress.  has no wheezes.  Neck = supple, no nuchal rigidity Abdominal: Soft. Bowel  sounds are normal.  exhibits no distension. There is no tenderness.  Lymphadenopathy: no cervical adenopathy. No axillary adenopathy Neurological: alert and oriented to person, place, and time.  Skin: Skin is warm and dry. No rash noted. No erythema.  JYN:WGNFA ankle is wrapped from surgery Psychiatric: a normal mood and affect.  behavior is normal.    Lab Results Recent Labs    04/07/24 0800 04/08/24 0634 04/09/24 0702  WBC 9.1 10.2 13.5*  HGB 13.6 12.6 12.2  HCT 43.9 40.1 39.0  NA 132* 132*  --   K 4.2 4.0  --   CL 100 100  --   CO2 23 22  --   BUN 11 15  --   CREATININE 1.19* 1.14*  --    Liver Panel No results for input(s): PROT, ALBUMIN, AST, ALT, ALKPHOS, BILITOT, BILIDIR, IBILI in the last 72 hours. Sedimentation Rate Recent Labs    04/07/24 1843  ESRSEDRATE 45*   C-Reactive Protein Recent Labs    04/07/24 1843  CRP 6.4*    Microbiology: OR cx- PsA Studies/Results: US  EKG SITE RITE Result Date: 04/09/2024 If Site Rite image not attached, placement could not be confirmed due to current cardiac rhythm.    Assessment/Plan: Right ankle wound, deep tissue infection s/p Ix D and HW removal = continue on cefepime only,  continue with renal dosing. We will wait for sensitivities to decide on final abtx. Place picc line today - plan for 4 wk from Ix D  Leukocytosis = slightly elevated but anticipate to trend back down. Will check cbc tomorrow  Prerenal azotemia = encourage better oral intake. We will check bmp tomorrow  I have personally spent 50 minutes involved in face-to-face and non-face-to-face activities for this patient on the day of the visit. Professional time spent includes the following activities: Preparing to see the patient (review of tests), Performing a medically appropriate examination and/or evaluation , Ordering medications/tests/procedures, referring and communicating with other health care professionals, Documenting clinical  information in the EMR, Independently interpreting results (not separately reported), Communicating results to the patient and husband. And coordinated plan with ortho team  Veida Spira B. Levern Reader MD MPH [facility] [phone]   [facility] for Infectious Diseases Pager: [phone]  04/09/2024, 10:58 AM

## 2024-04-09 NOTE — TOC Initial Note (Addendum)
 Transition of Care [facility]) - Initial/Assessment Note    Patient Details  Name: [name] [name] MRN: [id number] Date of Birth: [birth year]  [facility] CM/SW Contact:    Carmon Christen, LCSWA Phone Number: 04/09/2024, 9:55 AM  Clinical Narrative:                  CSW received consult for possible SNF placement at time of discharge. CSW spoke with patients spouse who was at bedside regarding PT recommendation of SNF placement at time of discharge. Patients spouse reports that PTA patient comes from [facility] short term.Patient and patients spouse agreeable to SNF placement at time of discharge. Patient plans on returning to [facility] when medially stable.CSW discussed insurance authorization process. No further questions reported at this time. CSW to continue to follow and assist with discharge planning needs.   Update- CSW spoke with Sherrlyn Dolores with [facility] who confirmed SNF bed for patient. If patient discharges this weekend please call Adriana Hopping [phone].  Expected Discharge Plan: Skilled Nursing Facility Barriers to Discharge: Continued Medical Work up   Patient Goals and CMS Choice Patient states their goals for this hospitalization and ongoing recovery are:: SNF   Choice offered to / list presented to : Patient      Expected Discharge Plan and Services In-house Referral: Clinical Social Work     Living arrangements for the past 2 months:  (from [facility] short term)                                      Prior Living Arrangements/Services Living arrangements for the past 2 months:  (from [facility] short term)   Patient language and need for interpreter reviewed:: Yes Do you feel safe going back to the place where you live?: No   SNF  Need for Family Participation in Patient Care: Yes (Comment) Care giver support system in place?: Yes (comment)   Criminal Activity/Legal Involvement Pertinent to Current  Situation/Hospitalization: No - Comment as needed  Activities of Daily Living   ADL Screening (condition at time of admission) Independently performs ADLs?: No Does the patient have a NEW difficulty with bathing/dressing/toileting/self-feeding that is expected to last >3 days?: Yes (Initiates electronic notice to provider for possible OT consult) Does the patient have a NEW difficulty with getting in/out of bed, walking, or climbing stairs that is expected to last >3 days?: Yes (Initiates electronic notice to provider for possible PT consult) Does the patient have a NEW difficulty with communication that is expected to last >3 days?: No Is the patient deaf or have difficulty hearing?: No Does the patient have difficulty seeing, even when wearing glasses/contacts?: No Does the patient have difficulty concentrating, remembering, or making decisions?: No  Permission Sought/Granted Permission sought to share information with : Facility Medical sales representative, Family Supports, Case Manager Permission granted to share information with : Yes, Verbal Permission Granted  Share Information with NAME: Adaline Holly  Permission granted to share info w AGENCY: SNF  Permission granted to share info w Relationship: spouse  Permission granted to share info w Contact Information: Adaline Holly [phone]  Emotional Assessment Appearance:: Appears stated age Attitude/Demeanor/Rapport: Gracious Affect (typically observed): Calm Orientation: : Oriented to Self, Oriented to Place, Oriented to  Time, Oriented to Situation Alcohol / Substance Use: Not Applicable Psych Involvement: No (comment)  Admission diagnosis:  Exposed orthopaedic hardware [employer]) [M57.846N] Patient Active Problem List  Diagnosis Date Noted   Exposed orthopaedic hardware (HCC) 04/07/2024   NSVT (nonsustained ventricular tachycardia) (HCC) 02/11/2024   Closed fracture of distal end of right fibula and tibia 02/11/2024   Sepsis secondary to UTI  (HCC) 02/11/2024   Complicated UTI (urinary tract infection) 03/31/2021   Cancer (HCC)    Complication of anesthesia    Hypertension    Essential thrombocythemia (HCC) 03/04/2021   Ventricular ectopy 12/28/2020   Presence of pessary    Wears glasses    Uses walker    Right wrist fracture    PAF (paroxysmal atrial fibrillation) (HCC)    Lumbar compression fracture (HCC)    Iron deficiency anemia    Hyperlipidemia    History of uterine cancer    History of septic shock    History of peptic ulcer disease    History of kidney infection    History of kidney stones    History of gastric polyp    History of colon polyps    History of arteriovenostomy for renal dialysis (HCC)    Hiatal hernia    Hematuria    GERD (gastroesophageal reflux disease)    General weakness    First degree heart block    Cystocele, midline    Chronic diastolic (congestive) heart failure (HCC)    Anticoagulant long-term use    Nonfunctioning kidney 03/23/2020   Urinary tract infection without hematuria    Occlusion of ureteral stent (HCC)    Chronic anticoagulation 08/04/2017   Coronary artery disease 06/16/2017   Encounter for monitoring flecainide  therapy 07/30/2016   Ureteral stricture, right 05/07/2016   Sepsis (HCC) 05/07/2016   Hydronephrosis 05/07/2016   Diverticulosis 05/07/2016   Abdominal aortic atherosclerosis (HCC) 05/07/2016   Congenital vaginal enterocele 04/15/2016   Female genital prolapse 04/15/2016   High risk medication use 04/15/2016   Hyperlipemia 04/15/2016   Midline cystocele 04/15/2016   Palpitations 04/15/2016   Tendonitis, Achilles 04/15/2016   Paroxysmal atrial fibrillation (HCC) 05/31/2015   Chronic diastolic congestive heart failure (HCC) 05/31/2015   Dyslipidemia 05/31/2015   Vomiting and diarrhea 06/11/2013   E coli bacteremia 06/08/2013   Anemia, unspecified 06/08/2013   NSTEMI, initial episode of care (HCC) 06/07/2013   Septic shock (HCC) 06/04/2013   Acute  respiratory failure with hypoxia (HCC) 06/04/2013   Pyelonephritis of left kidney 06/04/2013   Ureteral obstruction, right 06/04/2013   Acute renal failure superimposed on stage 3b chronic kidney disease (HCC) 06/04/2013   Lactic acidosis 06/04/2013   History of ventilator dependency 2014   Chronic kidney disease 2014   PCP:  Gaither Juba, MD Pharmacy:   [facility] - [place], [place] - [address] [place] [address] Phone: [phone] Fax: [phone]  [facility] [address] [place] [address] Phone: [phone] Fax: [phone]     Social Drivers of Health (SDOH) Social History: SDOH Screenings   Food Insecurity: No Food Insecurity (04/07/2024)  Housing: Low Risk  (04/07/2024)  Transportation Needs: No Transportation Needs (04/07/2024)  Utilities: Not At Risk (04/07/2024)  Alcohol Screen: Low Risk  (02/16/2024)  Depression (PHQ2-9): Low Risk  (01/19/2019)  Financial Resource Strain: Low Risk  (02/16/2024)  Social Connections: Socially Integrated (04/07/2024)  Tobacco Use: Low Risk  (04/07/2024)   SDOH Interventions:     Readmission Risk Interventions     No data to display

## 2024-04-09 NOTE — Progress Notes (Signed)
 2 Days Post-Op Procedure(s) (LRB): IRRIGATION AND DEBRIDEMENT WOUND (Right) REMOVAL, HARDWARE RIGHT TIBIA (Right) Subjective:  Patient reports pain as mild.  Slept okay.  No overnight events.  Just spoke with Infectious Disease specialist Dr. Levern Reader.  Plan for PICC line and culture sensitivities prior to dispo, anticipated for Monday.  Denies numbness or tingling.  No other complaints at this time.  Objective:   VITALS:   Vitals:   04/08/24 1203 04/08/24 1931 04/09/24 0512 04/09/24 0739  BP: (!) 116/52 (!) 118/59 126/64 (!) 112/59  Pulse: 76 77 89 83  Resp:  18 18 20   Temp:  99.3 F (37.4 C) 98.3 F (36.8 C) 99 F (37.2 C)  TempSrc:    Oral  SpO2: 100% 99% 96% 96%  Weight:      Height:        AAOx4 sitting comfortably in bed in NAD MSK:  Well-padded, well-fitting splint in place, limiting evaluation. Nontender above splint. Neuro sensation light touch over the toes. Toes warm well-perfused. Remainder of motor and sensory exam for the foot and ankle limited secondary to splint placement.  Wiggles toes appropriately. Intact pulses distally Dorsiflexion/Plantar flexion gently intact Compartment soft   Lab Results  Component Value Date   WBC 13.5 (H) 04/09/2024   HGB 12.2 04/09/2024   HCT 39.0 04/09/2024   MCV 88.0 04/09/2024   PLT 395 04/09/2024   BMET    Component Value Date/Time   NA 132 (L) 04/08/2024 0634   NA 138 08/28/2023 1554   K 4.0 04/08/2024 0634   CL 100 04/08/2024 0634   CO2 22 04/08/2024 0634   GLUCOSE 85 04/08/2024 0634   BUN 15 04/08/2024 0634   BUN 25 08/28/2023 1554   CREATININE 1.14 (H) 04/08/2024 0634   CREATININE 1.43 (H) 09/04/2022 0957   CALCIUM  9.2 04/08/2024 0634   EGFR 35 (L) 08/28/2023 1554   GFRNONAA 48 (L) 04/08/2024 0634   GFRNONAA 37 (L) 09/04/2022 0957     Xray: stable intra-operative imaging  Assessment/Plan: 2 Days Post-Op   Principal Problem:   Exposed orthopaedic hardware (HCC)   6/14 Update: Spoke with  Dr. Levern Reader of ID, patient needs PICC line placement prior to discharge.  Likely abx treatment for 4 weeks.  Also prefer to dispo once cultures/sensitivities return, anticipated for Monday.  Dispo likely to follow.   PLAN: Weightbearing: NWB RLE ROM: Okay for knee motion.  Maintain splint Incisional and dressing care: Dressings left intact until follow-up  Showering: Okay to shower, keep splint covered and dry Orthopedic device(s): Splint RLE Pain management: Continue multimodal pain control, minimize narcotics as able VTE prophylaxis: Home dose Eliquis , SCDs ID: Ceftriaxone  and daptomycin , 6/13 changed ceftriaxone  to cefepime.  Cultures/sensitivities pending Foley/Lines:  No foley, KVO IVFs Impediments to Fracture Healing: Vitamin D  level 10 checked April 2025, continue supplementation Dispo: PT/OT evaluation today.  Appreciate assistance from ID team with antibiotic management.  Plan for discharge back to SNF once final antibiotic recommendations have been made.     D/C recommendations: - Tylenol  and oxycodone  for pain control - Home dose Eliquis  for DVT prophylaxis - Continue Vit D supplementation   Follow - up plan: 2 weeks after d/c for wound check and repeat x-rays    Albertus Alt 04/09/2024, 7:46 AM   Contact information:   [email or web address] 7am-5pm epic message Dr. Pryor Browning, or call office for patient follow up: [phone] After hours and holidays please check [email or web address] for group call information for [facility]

## 2024-04-09 NOTE — Plan of Care (Signed)
   Problem: Activity: Goal: Risk for activity intolerance will decrease Outcome: Progressing   Problem: Nutrition: Goal: Adequate nutrition will be maintained Outcome: Progressing   Problem: Elimination: Goal: Will not experience complications related to bowel motility Outcome: Progressing   Problem: Safety: Goal: Ability to remain free from injury will improve Outcome: Progressing

## 2024-04-09 NOTE — Plan of Care (Signed)
  Problem: Education: Goal: Knowledge of General Education information will improve Description: Including pain rating scale, medication(s)/side effects and non-pharmacologic comfort measures Outcome: Progressing   Problem: Clinical Measurements: Goal: Ability to maintain clinical measurements within normal limits will improve Outcome: Progressing Goal: Will remain free from infection Outcome: Progressing Goal: Diagnostic test results will improve Outcome: Progressing   Problem: Coping: Goal: Level of anxiety will decrease Outcome: Progressing   Problem: Elimination: Goal: Will not experience complications related to bowel motility Outcome: Progressing

## 2024-04-10 DIAGNOSIS — R7989 Other specified abnormal findings of blood chemistry: Secondary | ICD-10-CM | POA: Diagnosis not present

## 2024-04-10 DIAGNOSIS — D72829 Elevated white blood cell count, unspecified: Secondary | ICD-10-CM | POA: Diagnosis not present

## 2024-04-10 DIAGNOSIS — B965 Pseudomonas (aeruginosa) (mallei) (pseudomallei) as the cause of diseases classified elsewhere: Secondary | ICD-10-CM | POA: Diagnosis not present

## 2024-04-10 DIAGNOSIS — T84498A Other mechanical complication of other internal orthopedic devices, implants and grafts, initial encounter: Secondary | ICD-10-CM | POA: Diagnosis not present

## 2024-04-10 LAB — CBC
HCT: 40.9 % (ref 36.0–46.0)
Hemoglobin: 12.4 g/dL (ref 12.0–15.0)
MCH: 27.3 pg (ref 26.0–34.0)
MCHC: 30.3 g/dL (ref 30.0–36.0)
MCV: 89.9 fL (ref 80.0–100.0)
Platelets: 324 10*3/uL (ref 150–400)
RBC: 4.55 MIL/uL (ref 3.87–5.11)
RDW: 16.6 % — ABNORMAL HIGH (ref 11.5–15.5)
WBC: 8.9 10*3/uL (ref 4.0–10.5)
nRBC: 0 % (ref 0.0–0.2)

## 2024-04-10 LAB — BASIC METABOLIC PANEL WITH GFR
Anion gap: 13 (ref 5–15)
BUN: 14 mg/dL (ref 8–23)
CO2: 16 mmol/L — ABNORMAL LOW (ref 22–32)
Calcium: 9.4 mg/dL (ref 8.9–10.3)
Chloride: 105 mmol/L (ref 98–111)
Creatinine, Ser: 0.96 mg/dL (ref 0.44–1.00)
GFR, Estimated: 59 mL/min — ABNORMAL LOW (ref >60)
Glucose, Bld: 108 mg/dL — ABNORMAL HIGH (ref 70–99)
Potassium: 4.7 mmol/L (ref 3.5–5.1)
Sodium: 134 mmol/L — ABNORMAL LOW (ref 135–145)

## 2024-04-10 MED ORDER — CHLORHEXIDINE GLUCONATE CLOTH 2 % EX PADS
6.0000 | MEDICATED_PAD | Freq: Every day | CUTANEOUS | Status: DC
  Administered 2024-04-10 – 2024-04-12 (×3): 6 via TOPICAL

## 2024-04-10 MED ORDER — SODIUM CHLORIDE 0.9% FLUSH: 10.0000 mL | INTRAVENOUS | Status: DC | PRN

## 2024-04-10 NOTE — Progress Notes (Signed)
 Peripherally Inserted Central Catheter Placement  The IV Nurse has discussed with the patient and/or persons authorized to consent for the patient, the purpose of this procedure and the potential benefits and risks involved with this procedure.  The benefits include less needle sticks, lab draws from the catheter, and the patient may be discharged home with the catheter. Risks include, but not limited to, infection, bleeding, blood clot (thrombus formation), and puncture of an artery; nerve damage and irregular heartbeat and possibility to perform a PICC exchange if needed/ordered by physician.  Alternatives to this procedure were also discussed.  Bard Power PICC patient education guide, fact sheet on infection prevention and patient information card has been provided to patient /or left at bedside.    PICC Placement Documentation  PICC Single Lumen 04/10/24 Right Basilic 41 cm 0 cm (Active)  Indication for Insertion or Continuance of Line Prolonged intravenous therapies 04/10/24 1159  Exposed Catheter (cm) 0 cm 04/10/24 1159  Site Assessment Clean, Dry, Intact 04/10/24 1159  Line Status Flushed;Saline locked;Blood return noted 04/10/24 1159  Dressing Type Transparent;Securing device 04/10/24 1159  Dressing Status Antimicrobial disc/dressing in place;Clean, Dry, Intact 04/10/24 1159  Line Care Connections checked and tightened 04/10/24 1159  Line Adjustment (NICU/IV Team Only) No 04/10/24 1159  Dressing Intervention New dressing;Adhesive placed at insertion site (IV team only) 04/10/24 1159  Dressing Change Due 04/17/24 04/10/24 1159       Kaydince Towles Haywood Lisle 04/10/2024, 12:09 PM

## 2024-04-10 NOTE — Progress Notes (Signed)
 [facility]    Date of Admission:  04/07/2024   Total days of antibiotics 4   ID: [name] [name] is a 83 y.o. female with  pseudomonal deep tissue HW infection of right ankle Principal Problem:   Exposed orthopaedic hardware (HCC)    Subjective: Afebrile. Noticing she is having loose stool  Medications:   amiodarone   200 mg Oral Daily   amitriptyline   10 mg Oral QHS   apixaban   2.5 mg Oral BID   carvedilol   3.125 mg Oral BID WC   docusate sodium   100 mg Oral BID   feeding supplement  1 Container Oral Daily   Gerhardt's butt cream   Topical BID   hydrALAZINE   25 mg Oral BID   hydroxyurea   500 mg Oral Once per day on Monday Wednesday Friday   mirtazapine   15 mg Oral QHS   pantoprazole   80 mg Oral Daily   pneumococcal 20-valent conjugate vaccine  0.5 mL Intramuscular Tomorrow-1000   sodium chloride  flush  3-10 mL Intravenous Q12H   sodium chloride   1 g Oral BID WC    Objective: Vital signs in last 24 hours: Temp:  [98.2 F (36.8 C)-98.6 F (37 C)] 98.3 F (36.8 C) (06/15 0753) Pulse Rate:  [69-81] 81 (06/15 0753) Resp:  [18] 18 (06/15 0753) BP: (110-138)/(51-70) 134/69 (06/15 0753) SpO2:  [95 %-100 %] 98 % (06/15 0753)  Physical Exam  Constitutional:  oriented to person, place, and time. appears well-developed and well-nourished. No distress.  HENT: [place]/AT, PERRLA, no scleral icterus Mouth/Throat: Oropharynx is clear and moist. No oropharyngeal exudate.  Cardiovascular: Normal rate, regular rhythm and normal heart sounds. Exam reveals no gallop and no friction rub.  No murmur heard.  Pulmonary/Chest: Effort normal and breath sounds normal. No respiratory distress.  has no wheezes.  Neck = supple, no nuchal rigidity Abdominal: Soft. Bowel sounds are normal.  exhibits no distension. There is no tenderness.  WUJ:WJXBJ ankle wrapped Skin: Skin is warm and dry. No rash noted. No erythema.  Psychiatric: a normal mood and affect.  behavior is  normal.    Lab Results Recent Labs    04/08/24 0634 04/09/24 0702  WBC 10.2 13.5*  HGB 12.6 12.2  HCT 40.1 39.0  NA 132*  --   K 4.0  --   CL 100  --   CO2 22  --   BUN 15  --   CREATININE 1.14*  --    Liver Panel No results for input(s): PROT, ALBUMIN, AST, ALT, ALKPHOS, BILITOT, BILIDIR, IBILI in the last 72 hours. Sedimentation Rate Recent Labs    04/07/24 1843  ESRSEDRATE 45*   C-Reactive Protein Recent Labs    04/07/24 1843  CRP 6.4*    Microbiology: tissue 6/12 Pseudomonas aeruginosa      MIC    CEFEPIME 2 SENSITIVE Sensitive    CEFTAZIDIME 4 SENSITIVE Sensitive    CIPROFLOXACIN  <=0.25 SENS... Sensitive    GENTAMICIN  <=1 SENSITIVE Sensitive    IMIPENEM 1 SENSITIVE Sensitive    PIP/TAZO <=4 SENSITI... Sensitive    Studies/Results: US  EKG SITE RITE Result Date: 04/09/2024 If Site Rite image not attached, placement could not be confirmed due to current cardiac rhythm.    Assessment/Plan: Pseudomonal deep tissue infection involving HW to right ankle s/p HW removal and I x D = continue on cefepime. Plan to treat for 4 wk using 6/13 as day 1.   Awaiting picc line today, placed orders in  Will arrange opat orders for snf/home if she gets released from SNF earlier.  Will arrange for follow up in 3-4 wk in clinic  Wound care per ortho  Diarrhea = will stop stool softeners; no need for cdiff testing  Ankle pain = continue with non-opiates, either tylenol  or robaxin .  Diagnosis: Deep tissue infection of ankle  Culture Result:  PsA  Allergies  Allergen Reactions   Codeine Nausea Only and Other (See Comments)    hallucinations   Hydrocodone  Nausea Only and Other (See Comments)    OPAT Orders Discharge antibiotics to be given via PICC line Discharge antibiotics: cefepime 2g iv q 12hr Per pharmacy protocol cefepime  Duration: 4 wk End Date: 05/06/2024  [facility] Care Per Protocol:  Home health RN for IV administration and  teaching; PICC line care and labs.    Labs weekly while on IV antibiotics: _x_ CBC with differential _x_ BMP  _x_ CRP _x_ ESR   _x_ Please pull PIC at completion of IV antibiotics  Fax weekly labs to [phone]  Clinic Follow Up Appt: 3-4 wk at [facility]  Will sign off.  evaluation of this patient requires complex antimicrobial therapy evaluation and counseling and isolation needs for disease transmission risk assessment and mitigation.   I have personally spent 50 minutes involved in face-to-face and non-face-to-face activities for this patient on the day of the visit. Professional time spent includes the following activities: Preparing to see the patient (review of tests),  Performing a medically appropriate examination and/or evaluation , Ordering medications/tests/procedures, referring and communicating with other health care professionals, Documenting clinical information in the EMR, Independently interpreting results (not separately reported), Communicating results to the patient and husband. Counseling and educating the patient and Care coordination       [facility] Pager: [phone]  04/10/2024, 9:41 AM

## 2024-04-10 NOTE — Plan of Care (Signed)
  Problem: Activity: Goal: Risk for activity intolerance will decrease Outcome: Progressing   Problem: Coping: Goal: Level of anxiety will decrease Outcome: Progressing   Problem: Elimination: Goal: Will not experience complications related to bowel motility Outcome: Progressing

## 2024-04-10 NOTE — Progress Notes (Signed)
 PHARMACY CONSULT NOTE FOR:  OUTPATIENT  PARENTERAL ANTIBIOTIC THERAPY (OPAT)  Indication: R ankle wound, deep tissue infection s/p I and D and hardware removal  Regimen: Cefepime 2g IV Q12H  End date: 05/06/24 (4 weeks from I and D)   IV antibiotic discharge orders are pended. To discharging provider:  please sign these orders via discharge navigator,  Select New Orders & click on the button choice - Manage This Unsigned Work.     Thank you for allowing pharmacy to be a part of this patient's care.  Chrystie Crass, PharmD Clinical Pharmacist  04/10/2024 9:18 AM

## 2024-04-10 NOTE — Progress Notes (Signed)
 3 Days Post-Op Procedure(s) (LRB): IRRIGATION AND DEBRIDEMENT WOUND (Right) REMOVAL, HARDWARE RIGHT TIBIA (Right) Subjective:  Initially came by, patient getting PICC line.  Returned after PICC line.  Patient reports pain as mild.  Slept okay.  No overnight events.  PICC line in place, still pending culture sensitivities prior prior to dispo per ID, anticipated for Monday.  Denies numbness or tingling.  No other complaints at this time.  Objective:   VITALS:   Vitals:   04/09/24 1609 04/09/24 2006 04/10/24 0504 04/10/24 0753  BP: (!) 112/59 (!) 110/51 138/70 134/69  Pulse: 73 69 81 81  Resp:  18 18 18   Temp: 98.4 F (36.9 C) 98.6 F (37 C) 98.2 F (36.8 C) 98.3 F (36.8 C)  TempSrc: Oral   Oral  SpO2: 99% 95% 100% 98%  Weight:      Height:        AAOx4 sitting comfortably in bed in NAD MSK:  Well-padded, well-fitting splint in place, limiting evaluation. Nontender above splint. Neuro sensation light touch over the toes. Toes warm well-perfused. Remainder of motor and sensory exam for the foot and ankle limited secondary to splint placement.  Wiggles toes appropriately. Intact pulses distally Dorsiflexion/Plantar flexion gently intact Compartment soft   Lab Results  Component Value Date   WBC 8.9 04/10/2024   HGB 12.4 04/10/2024   HCT 40.9 04/10/2024   MCV 89.9 04/10/2024   PLT 324 04/10/2024   BMET    Component Value Date/Time   NA 134 (L) 04/10/2024 0846   NA 138 08/28/2023 1554   K 4.7 04/10/2024 0846   CL 105 04/10/2024 0846   CO2 16 (L) 04/10/2024 0846   GLUCOSE 108 (H) 04/10/2024 0846   BUN 14 04/10/2024 0846   BUN 25 08/28/2023 1554   CREATININE 0.96 04/10/2024 0846   CREATININE 1.43 (H) 09/04/2022 0957   CALCIUM  9.4 04/10/2024 0846   EGFR 35 (L) 08/28/2023 1554   GFRNONAA 59 (L) 04/10/2024 0846   GFRNONAA 37 (L) 09/04/2022 0957     Xray: stable intra-operative imaging  Assessment/Plan: 3 Days Post-Op   Principal Problem:   Exposed  orthopaedic hardware [facility])   Updates 6/14: Spoke with Dr. Levern Reader of ID, patient needs PICC line placement prior to discharge.  Likely abx treatment for 4 weeks.  Also prefer to dispo once cultures/sensitivities return, anticipated for Monday.  Dispo likely to follow.  6/15:  PICC line in RUE.  Still pending culture/sensitivities.  Likely dispo tomorrow.     PLAN: Weightbearing: NWB RLE ROM: Okay for knee motion.  Maintain splint Incisional and dressing care: Dressings left intact until follow-up  Showering: Okay to shower, keep splint covered and dry Orthopedic device(s): Splint RLE Pain management: Continue multimodal pain control, minimize narcotics as able VTE prophylaxis: Home dose Eliquis , SCDs ID: Ceftriaxone  and daptomycin , 6/13 changed ceftriaxone  to cefepime.  Cultures/sensitivities pending Foley/Lines:  No foley, KVO IVFs Impediments to Fracture Healing: Vitamin D  level 10 checked April 2025, continue supplementation Dispo: PT/OT evaluation today.  Appreciate assistance from ID team with antibiotic management.  Plan for discharge back to SNF once final antibiotic recommendations have been made.     D/C recommendations: - Tylenol  and oxycodone  for pain control - Home dose Eliquis  for DVT prophylaxis - Continue Vit D supplementation   Follow - up plan: 2 weeks after d/c for wound check and repeat x-rays    Albertus Alt 04/10/2024, 10:41 AM   Contact information:   [email or web address] 7am-5pm  epic message Dr. Pryor Browning, or call office for patient follow up: [phone] After hours and holidays please check [email or web address] for group call information for [facility]

## 2024-04-11 MED ORDER — CEFEPIME IV (FOR PTA / DISCHARGE USE ONLY): 2.0000 g | Freq: Two times a day (BID) | INTRAVENOUS | 0 refills | Status: AC

## 2024-04-11 NOTE — Plan of Care (Signed)

## 2024-04-11 NOTE — TOC Progression Note (Signed)
 Transition of Care [facility]) - Progression Note    Patient Details  Name: NOAM FRANZEN MRN: [id number] Date of Birth: [birth year]  [facility] CM/SW Contact  Elspeth Hals, LCSW Phone Number: 04/11/2024, 10:19 AM  Clinical Narrative:   SNF auth request submitted to [employer].      Expected Discharge Plan: Skilled Nursing Facility Barriers to Discharge: Continued Medical Work up  Expected Discharge Plan and Services In-house Referral: Clinical Social Work     Living arrangements for the past 2 months:  (from [employer] short term) Expected Discharge Date: 04/11/24                                     Social Determinants of Health (SDOH) Interventions SDOH Screenings   Food Insecurity: No Food Insecurity (04/07/2024)  Housing: Low Risk  (04/07/2024)  Transportation Needs: No Transportation Needs (04/07/2024)  Utilities: Not At Risk (04/07/2024)  Alcohol Screen: Low Risk  (02/16/2024)  Depression (PHQ2-9): Low Risk  (01/19/2019)  Financial Resource Strain: Low Risk  (02/16/2024)  Social Connections: Socially Integrated (04/07/2024)  Tobacco Use: Low Risk  (04/07/2024)    Readmission Risk Interventions     No data to display

## 2024-04-11 NOTE — Plan of Care (Signed)

## 2024-04-11 NOTE — Discharge Summary (Signed)
 Orthopaedic Trauma Service (OTS) Discharge Summary   Patient ID: [name] [name] MRN: [id number] DOB/AGE: 03-18-1941 83 y.o.  Admit date: 04/07/2024 Discharge date: 04/12/2024  Admission Diagnoses: Right distal tibia/fibula fracture s/p ORIF Fracture related hardware infection with exposed plate   Discharge Diagnoses:  Principal Problem:   Exposed orthopaedic hardware [facility])   Past Medical History:  Diagnosis Date   Abdominal aortic atherosclerosis (HCC) 05/07/2016   Noted on CT abd/pelvis   Acute renal failure (HCC) 06/04/2013   Acute respiratory failure with hypoxia (HCC) 06/04/2013   Anemia, unspecified 06/08/2013   Anticoagulant long-term use    ELIQUIS    Cancer (HCC)    uterine cancer    Chronic anticoagulation 08/04/2017   Chronic diastolic (congestive) heart failure (HCC)     pt denies    Chronic diastolic congestive heart failure (HCC) 05/31/2015   Chronic kidney disease 2014   arf on dialysis in icu   Complicated UTI (urinary tract infection) 03/31/2021   Complication of anesthesia    2021- kidney removed at [facility] pt reports did not get enough med to wake her up and she could not talk and then recovered after given more of wake up med from anesthesia    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 Wayna Hails [facility] in [place])  pt uses pessary   Diarrhea 06/11/2013   Diverticulosis 05/07/2016   Noted on CT abd/pelvis   Dyslipidemia 05/31/2015   E coli bacteremia 06/08/2013   Encounter for monitoring flecainide  therapy 07/30/2016   Essential thrombocythemia (HCC) 03/04/2021   Female genital prolapse 04/15/2016   First degree heart block    General weakness    GERD (gastroesophageal reflux disease)    Hematuria    intermittently due to ureter right stent   Hiatal hernia    High risk medication use 04/15/2016   History of arteriovenostomy for renal  dialysis [facility])    History of colon polyps    History of gastric polyp    History of kidney infection    12-31-2018  perinephrtic abscess  right side  s/p drains x2 01-10-2019,  05/ 2020 drains removed   History of kidney stones    History of peptic ulcer disease    History of septic shock    2011 (pt unaware) and 06-04-2013  w/ acute renal failure   History of uterine cancer    STAGE I  --  S/P TAH W/ BSO  (NO OTHER TX)   History of ventilator dependency 2014   in setting of septic shock   Hydronephrosis 05/07/2016   Hyperlipemia 04/15/2016   Hyperlipidemia    Hypertension    Iron deficiency anemia hematology/ oncology--- dr Harles Lied at [facility] in [place]--- per lov note 08-07-2017  stabilized and felt to be more chronic anemia   01/ 2018  dx iron def. anemia  s/p  IV Iron infusion and taking oral iron supplement   Lactic acidosis 06/04/2013   Lumbar compression fracture (HCC)    01-22-2019 per imaging L1  (06-01-2019 per pt currently no pain)   Midline cystocele 04/15/2016   Nonfunctioning kidney 03/23/2020   NSTEMI, initial episode of care [facility]) 06/07/2013   pt denies    Occlusion of ureteral stent (HCC)    PAF (paroxysmal atrial fibrillation) (HCC) DX JUNE 2012   CARDIOLOGIST--  DR Parks Bollman--- [facility])  CHADS2   Palpitations  04/15/2016   Paroxysmal atrial fibrillation (HCC) 08/04/2017   Presence of pessary    Pyelonephritis 06/04/2013   Pyelonephritis of left kidney 06/04/2013   Right wrist fracture    per pt fractured on 03-10-2019,  had closed reduction and wearing a brace   Sepsis (HCC) 05/07/2016   Septic shock (HCC) 06/04/2013   IMO SNOMED Dx Update Oct 2024     Septic shock(785.52) 06/04/2013   Tendonitis, Achilles 04/15/2016   Ureteral obstruction, right 06/04/2013   Ureteral stricture, right urologist-- dr Secundino Dach   Chronic--- treated with ureteral stent   Urinary tract infection without hematuria    Uses walker     and wheelchair for longer distance   Ventricular ectopy 12/28/2020   Wears glasses      Procedures Performed:  CPT 14020-Tissue transfer to cover right ankle wound CPT 20680-Removal of hardware right ankle CPT 10180-Irrigation and debridement of right ankle    Discharged Condition: good/stable  Hospital Course: Patient presented to [facility] on 04/07/2024 for scheduled procedure on the right lower extremity.  She was taken to the operating room by Dr. Curtiss [name] for the above procedure.  She tolerated the procedure well without complications.  Intraoperative wound cultures were obtained.  Patient was placed in a short leg splint postoperatively.  She was admitted to the orthopedic service for IV antibiotics and therapies.  Infectious disease was consulted to assist with antibiotic management.  The remainder of patient's hospitalization was dedicated to achieving adequate pain control, increase mobility, and awaiting final culture results to determine appropriate discharge antibiotics. On 04/12/2024, the patient was tolerating diet, working well with therapies, pain well controlled, vital signs stable, dressings clean, dry, intact and felt stable for discharge to SNF. Patient will follow up as below and knows to call with questions or concerns.     Consults: ID  Significant Diagnostic Studies:   Results for orders placed or performed during the hospital encounter of 04/07/24 (from the past week)  CBC per protocol   Collection Time: 04/07/24  8:00 AM  Result Value Ref Range   WBC 9.1 4.0 - 10.5 K/uL   RBC 5.01 3.87 - 5.11 MIL/uL   Hemoglobin 13.6 12.0 - 15.0 g/dL   HCT 96.0 45.4 - 09.8 %   MCV 87.6 80.0 - 100.0 fL   MCH 27.1 26.0 - 34.0 pg   MCHC 31.0 30.0 - 36.0 g/dL   RDW 11.9 (H) 14.7 - 82.9 %   Platelets 521 (H) 150 - 400 K/uL   nRBC 0.0 0.0 - 0.2 %  Basic metabolic panel per protocol   Collection Time: 04/07/24  8:00 AM  Result Value Ref Range   Sodium 132 (L) 135 - 145  mmol/L   Potassium 4.2 3.5 - 5.1 mmol/L   Chloride 100 98 - 111 mmol/L   CO2 23 22 - 32 mmol/L   Glucose, Bld 123 (H) 70 - 99 mg/dL   BUN 11 8 - 23 mg/dL   Creatinine, Ser 5.62 (H) 0.44 - 1.00 mg/dL   Calcium  9.6 8.9 - 10.3 mg/dL   GFR, Estimated 46 (L) >60 mL/min   Anion gap 9 5 - 15  Aerobic/Anaerobic Culture w Gram Stain (surgical/deep wound)   Collection Time: 04/07/24 10:38 AM   Specimen: PATH Soft tissue  Result Value Ref Range   Specimen Description TISSUE    Special Requests right ankle tissue 1    Gram Stain      ABUNDANT WBC PRESENT, PREDOMINANTLY PMN NO ORGANISMS  SEEN Performed at [facility], [address] [place], [place] [address]    Culture      RARE PSEUDOMONAS AERUGINOSA NO ANAEROBES ISOLATED; CULTURE IN PROGRESS FOR 5 DAYS    Report Status PENDING    Organism ID, Bacteria PSEUDOMONAS AERUGINOSA       Susceptibility   Pseudomonas aeruginosa - MIC*    CEFTAZIDIME 4 SENSITIVE Sensitive     CIPROFLOXACIN  <=0.25 SENSITIVE Sensitive     GENTAMICIN  <=1 SENSITIVE Sensitive     IMIPENEM 1 SENSITIVE Sensitive     PIP/TAZO <=4 SENSITIVE Sensitive ug/mL    CEFEPIME 2 SENSITIVE Sensitive     * RARE PSEUDOMONAS AERUGINOSA  Aerobic/Anaerobic Culture w Gram Stain (surgical/deep wound)   Collection Time: 04/07/24 10:41 AM   Specimen: PATH Soft tissue  Result Value Ref Range   Specimen Description TISSUE    Special Requests right ankle 2    Gram Stain      FEW WBC PRESENT, PREDOMINANTLY PMN RARE GRAM POSITIVE COCCI Performed at [facility], [address], [place], [place] [address]    Culture      RARE PSEUDOMONAS AERUGINOSA NO ANAEROBES ISOLATED; CULTURE IN PROGRESS FOR 5 DAYS    Report Status PENDING    Organism ID, Bacteria PSEUDOMONAS AERUGINOSA       Susceptibility   Pseudomonas aeruginosa - MIC*    CEFTAZIDIME 2 SENSITIVE Sensitive     CIPROFLOXACIN  0.5 SENSITIVE Sensitive     GENTAMICIN  <=1 SENSITIVE Sensitive     IMIPENEM 1 SENSITIVE  Sensitive     PIP/TAZO 8 SENSITIVE Sensitive ug/mL    CEFEPIME 2 SENSITIVE Sensitive     * RARE PSEUDOMONAS AERUGINOSA  Aerobic/Anaerobic Culture w Gram Stain (surgical/deep wound)   Collection Time: 04/07/24 10:41 AM   Specimen: PATH Soft tissue  Result Value Ref Range   Specimen Description TISSUE    Special Requests right ankle 3    Gram Stain      RARE WBC PRESENT, PREDOMINANTLY PMN NO ORGANISMS SEEN Performed at [facility], [address] [place], [place] [address]    Culture      FEW PSEUDOMONAS AERUGINOSA SUSCEPTIBILITIES PERFORMED ON PREVIOUS CULTURE WITHIN THE LAST 5 DAYS. NO ANAEROBES ISOLATED; CULTURE IN PROGRESS FOR 5 DAYS    Report Status PENDING   Glucose, capillary   Collection Time: 04/07/24 12:35 PM  Result Value Ref Range   Glucose-Capillary 104 (H) 70 - 99 mg/dL  MRSA Next Gen by PCR, Nasal   Collection Time: 04/07/24  3:28 PM   Specimen: Nasal Mucosa; Nasal Swab  Result Value Ref Range   MRSA by PCR Next Gen NOT DETECTED NOT DETECTED  Sedimentation rate   Collection Time: 04/07/24  6:43 PM  Result Value Ref Range   Sed Rate 45 (H) 0 - 22 mm/hr  C-reactive protein   Collection Time: 04/07/24  6:43 PM  Result Value Ref Range   CRP 6.4 (H) <1.0 mg/dL  Basic metabolic panel   Collection Time: 04/08/24  6:34 AM  Result Value Ref Range   Sodium 132 (L) 135 - 145 mmol/L   Potassium 4.0 3.5 - 5.1 mmol/L   Chloride 100 98 - 111 mmol/L   CO2 22 22 - 32 mmol/L   Glucose, Bld 85 70 - 99 mg/dL   BUN 15 8 - 23 mg/dL   Creatinine, Ser 2.95 (H) 0.44 - 1.00 mg/dL   Calcium  9.2 8.9 - 10.3 mg/dL   GFR, Estimated  48 (L) >60 mL/min   Anion gap 10 5 - 15  CBC   Collection Time: 04/08/24  6:34 AM  Result Value Ref Range   WBC 10.2 4.0 - 10.5 K/uL   RBC 4.63 3.87 - 5.11 MIL/uL   Hemoglobin 12.6 12.0 - 15.0 g/dL   HCT 16.1 09.6 - 04.5 %   MCV 86.6 80.0 - 100.0 fL   MCH 27.2 26.0 - 34.0 pg   MCHC 31.4 30.0 - 36.0 g/dL   RDW 40.9 (H) 81.1 - 91.4 %    Platelets 444 (H) 150 - 400 K/uL   nRBC 0.0 0.0 - 0.2 %  CK   Collection Time: 04/08/24  6:34 AM  Result Value Ref Range   Total CK 11 (L) 38 - 234 U/L  CBC   Collection Time: 04/09/24  7:02 AM  Result Value Ref Range   WBC 13.5 (H) 4.0 - 10.5 K/uL   RBC 4.43 3.87 - 5.11 MIL/uL   Hemoglobin 12.2 12.0 - 15.0 g/dL   HCT 78.2 95.6 - 21.3 %   MCV 88.0 80.0 - 100.0 fL   MCH 27.5 26.0 - 34.0 pg   MCHC 31.3 30.0 - 36.0 g/dL   RDW 08.6 (H) 57.8 - 46.9 %   Platelets 395 150 - 400 K/uL   nRBC 0.0 0.0 - 0.2 %  Basic metabolic panel with GFR   Collection Time: 04/10/24  8:46 AM  Result Value Ref Range   Sodium 134 (L) 135 - 145 mmol/L   Potassium 4.7 3.5 - 5.1 mmol/L   Chloride 105 98 - 111 mmol/L   CO2 16 (L) 22 - 32 mmol/L   Glucose, Bld 108 (H) 70 - 99 mg/dL   BUN 14 8 - 23 mg/dL   Creatinine, Ser 6.29 0.44 - 1.00 mg/dL   Calcium  9.4 8.9 - 10.3 mg/dL   GFR, Estimated 59 (L) >60 mL/min   Anion gap 13 5 - 15  CBC   Collection Time: 04/10/24  8:46 AM  Result Value Ref Range   WBC 8.9 4.0 - 10.5 K/uL   RBC 4.55 3.87 - 5.11 MIL/uL   Hemoglobin 12.4 12.0 - 15.0 g/dL   HCT 52.8 41.3 - 24.4 %   MCV 89.9 80.0 - 100.0 fL   MCH 27.3 26.0 - 34.0 pg   MCHC 30.3 30.0 - 36.0 g/dL   RDW 01.0 (H) 27.2 - 53.6 %   Platelets 324 150 - 400 K/uL   nRBC 0.0 0.0 - 0.2 %     Treatments: IV hydration, antibiotics: ceftriaxone , daptomycin  and cefepime, analgesia: acetaminophen  and oxycodone  ED, cardiac meds: amiodarone , anticoagulation: Eliquis , therapies: PT and OT, and surgery: As above  Discharge Exam: AAOx4 sitting comfortably in bed in NAD MSK:  Well-padded, well-fitting splint in place, limiting evaluation. Nontender above splint. Neuro sensation light touch over the toes. Toes warm well-perfused. Remainder of motor and sensory exam for the foot and ankle limited secondary to splint placement.  Wiggles toes appropriately. Intact pulses distally Dorsiflexion/Plantar flexion gently  intact Compartment soft   Disposition: Discharge disposition: 03-Skilled Nursing Facility       Discharge Instructions     [facility] pharmacist to adjust dose for Vancomycin , Aminoglycosides and other anti-infective therapies as requested by physician.   Complete by: As directed    [facility] to provide Cath Flo 2mg    Complete by: As directed    Administer for PICC line occlusion and as ordered by physician for other  access device issues.   Anaphylaxis Kit: Provided to treat any anaphylactic reaction to the medication being provided to the patient if First Dose or when requested by physician   Complete by: As directed    Epinephrine  1mg /ml vial / amp: Administer 0.3mg  (0.88ml) subcutaneously once for moderate to severe anaphylaxis, nurse to call physician and pharmacy when reaction occurs and call 911 if needed for immediate care   Diphenhydramine  50mg /ml IV vial: Administer 25-50mg  IV/IM PRN for first dose reaction, rash, itching, mild reaction, nurse to call physician and pharmacy when reaction occurs   Sodium Chloride  0.9% NS 500ml IV: Administer if needed for hypovolemic blood pressure drop or as ordered by physician after call to physician with anaphylactic reaction   Call MD / Call 911   Complete by: As directed    If you experience chest pain or shortness of breath, CALL 911 and be transported to the hospital emergency room.  If you develope a fever above 101 F, pus (white drainage) or increased drainage or redness at the wound, or calf pain, call your surgeon's office.   Change dressing on IV access line weekly and PRN   Complete by: As directed    Constipation Prevention   Complete by: As directed    Drink plenty of fluids.  Prune juice may be helpful.  You may use a stool softener, such as Colace (over the counter) 100 mg twice a day.  Use MiraLax  (over the counter) for constipation as needed.   Diet - low sodium heart healthy   Complete by: As  directed    Flush IV access with Sodium Chloride  0.9% and Heparin  10 units/ml or 100 units/ml   Complete by: As directed    Home infusion instructions - [facility]   Complete by: As directed    Instructions: Flush IV access with Sodium Chloride  0.9% and Heparin  10units/ml or 100units/ml   Change dressing on IV access line: Weekly and PRN   Instructions Cath Flo 2mg : Administer for PICC Line occlusion and as ordered by physician for other access device   [facility] pharmacist to adjust dose for: Vancomycin , Aminoglycosides and other anti-infective therapies as requested by physician   Increase activity slowly as tolerated   Complete by: As directed    Method of administration may be changed at the discretion of home infusion pharmacist based upon assessment of the patient and/or caregiver's ability to self-administer the medication ordered   Complete by: As directed    Post-operative opioid taper instructions:   Complete by: As directed    POST-OPERATIVE OPIOID TAPER INSTRUCTIONS: It is important to wean off of your opioid medication as soon as possible. If you do not need pain medication after your surgery it is ok to stop day one. Opioids include: Codeine, Hydrocodone (Norco, Vicodin), Oxycodone (Percocet, oxycontin ) and hydromorphone  amongst others.  Long term and even short term use of opiods can cause: Increased pain response Dependence Constipation Depression Respiratory depression And more.  Withdrawal symptoms can include Flu like symptoms Nausea, vomiting And more Techniques to manage these symptoms Hydrate well Eat regular healthy meals Stay active Use relaxation techniques(deep breathing, meditating, yoga) Do Not substitute Alcohol to help with tapering If you have been on opioids for less than two weeks and do not have pain than it is ok to stop all together.  Plan to wean off of opioids This plan should start within one week post op of your  joint replacement. Maintain the same interval or time  between taking each dose and first decrease the dose.  Cut the total daily intake of opioids by one tablet each day Next start to increase the time between doses. The last dose that should be eliminated is the evening dose.         Allergies as of 04/12/2024       Reactions   Codeine Nausea Only, Other (See Comments)   hallucinations   Hydrocodone  Nausea Only, Other (See Comments)        Medication List     PAUSE taking these medications    fosfomycin 3 g Pack Wait to take this until: May 12, 2024 Commonly known as: MONUROL Take 3 g by mouth every Thursday.       STOP taking these medications    linezolid  600 MG tablet Commonly known as: ZYVOX        TAKE these medications    acetaminophen  500 MG tablet Commonly known as: TYLENOL  Take 1,000 mg by mouth every 6 (six) hours as needed for moderate pain or headache.   albuterol  108 (90 Base) MCG/ACT inhaler Commonly known as: VENTOLIN  HFA Inhale 2 puffs into the lungs every 4 (four) hours as needed for wheezing or shortness of breath.   aluminum-magnesium  hydroxide-simethicone 200-200-20 MG/5ML Susp Commonly known as: MAALOX Take 30 mLs by mouth every 4 (four) hours as needed (Indigestion Notify MD of no relief after 12 hours).   amiodarone  200 MG tablet Commonly known as: PACERONE  Take 1 tablet (200 mg total) by mouth daily.   amitriptyline  10 MG tablet Commonly known as: ELAVIL  Take 10 mg by mouth at bedtime.   apixaban  2.5 MG Tabs tablet Commonly known as: Eliquis  Take 1 tablet (2.5 mg total) by mouth 2 (two) times daily.   carvedilol  3.125 MG tablet Commonly known as: COREG  Take 1 tablet (3.125 mg total) by mouth 2 (two) times daily with a meal.   ceFEPime IVPB Commonly known as: MAXIPIME Inject 2 g into the vein every 12 (twelve) hours for 26 days. Indication:  R ankle wound s/p I and D and hardware removal- 2/2 Pseudomonas aeruginosa   First Dose: Yes Last Day of Therapy:  05/06/24 Labs - Once weekly:  CBC/D and BMP, Labs - Once weekly: ESR and CRP Method of administration: IV Push Method of administration may be changed at the discretion of home infusion pharmacist based upon assessment of the patient and/or caregiver's ability to self-administer the medication ordered.   [employer] Use with the albuterol  inhaler   CRANBERRY-VITAMIN C-INULIN PO Take 30 mLs by mouth 2 (two) times daily.   feeding supplement Liqd Commonly known as: BOOST / RESOURCE BREEZE Take 1 Container by mouth daily.   furosemide  20 MG tablet Commonly known as: LASIX  Take 20 mg by mouth daily as needed (Weight gain of 3LB or Greater).   hydrALAZINE  25 MG tablet Commonly known as: APRESOLINE  Take 25 mg by mouth 2 (two) times daily.   hydrocortisone  25 MG suppository Commonly known as: ANUSOL -HC Place 25 mg rectally every 8 (eight) hours as needed for hemorrhoids or anal itching.   hydroxyurea  500 MG capsule Commonly known as: HYDREA  TAKE ONE CAPSULE BY MOUTH EVERY MONDAY, WEDNESDAY, AND FRIDAY FOR ELEVATED PLATELETS   loperamide  2 MG tablet Commonly known as: IMODIUM  A-D Take 4 mg by mouth every 6 (six) hours as needed for diarrhea or loose stools.   methocarbamol  500 MG tablet Commonly known as: ROBAXIN  Take 1 tablet (500 mg total) by mouth every 8 (  eight) hours as needed for muscle spasms.   mirtazapine  15 MG tablet Commonly known as: REMERON  Take 15 mg by mouth at bedtime.   omeprazole 40 MG capsule Commonly known as: PRILOSEC Take 40 mg by mouth daily.   ondansetron  4 MG disintegrating tablet Commonly known as: ZOFRAN -ODT Take 4 mg by mouth every 6 (six) hours as needed for nausea/vomiting.   OVER THE COUNTER MEDICATION daily as needed. Magic cup/Boost pudding or   oxyCODONE  5 MG immediate release tablet Commonly known as: Oxy IR/ROXICODONE  Take 0.5-1 tablets (2.5-5 mg total) by mouth every 4 (four)  hours as needed (2.5 mg pain socre 4-6, 5 mg pain score 7-10).   polyethylene glycol 17 g packet Commonly known as: MIRALAX  / GLYCOLAX  Take 17 g by mouth daily as needed for mild constipation.   senna-docusate 8.6-50 MG tablet Commonly known as: Senokot-S Take 1 tablet by mouth 2 (two) times daily. What changed:  how much to take when to take this reasons to take this   triamcinolone  cream 0.1 % Commonly known as: KENALOG  Apply 1 application  topically every 12 (twelve) hours as needed (eczema).       ASK your doctor about these medications    Cholecalciferol  1.25 MG (50000 UT) capsule Take 1 capsule (50,000 Units total) by mouth once a week for 8 doses. Ask about: Should I take this medication?               Discharge Care Instructions  (From admission, onward)           Start     Ordered   04/11/24 0000  Change dressing on IV access line weekly and PRN  (Home infusion instructions - [facility] )        04/11/24 0858            Follow-up Information     Haddix, Florentina Huntsman, MD. Schedule an appointment as soon as possible for a visit on 04/26/2024.   Specialty: Orthopedic Surgery Why: 04/26/24 at 3:15 PM for splint and suture removal, wound check Contact information: [address] [place] [address] [phone]                 Discharge Instructions and Plan: Patient will be discharged back to [facility] rehab facility. Will be discharged on her home dose Eliquis  for DVT prophylaxis. Patient has been provided with all the necessary DME for discharge. Patient will follow up with Dr. Curtiss [name] in 2 weeks for repeat x-rays and splint/suture removal.   Signed:  [name], PA-C ?([phone]? (phone) 04/12/2024, 11:26 AM  [facility] [address] [place] [address] [phone] Cathlean Co (F)

## 2024-04-12 DIAGNOSIS — S82301D Unspecified fracture of lower end of right tibia, subsequent encounter for closed fracture with routine healing: Secondary | ICD-10-CM | POA: Diagnosis not present

## 2024-04-12 DIAGNOSIS — T8142XD Infection following a procedure, deep incisional surgical site, subsequent encounter: Secondary | ICD-10-CM | POA: Diagnosis not present

## 2024-04-12 DIAGNOSIS — Z87440 Personal history of urinary (tract) infections: Secondary | ICD-10-CM | POA: Diagnosis not present

## 2024-04-12 DIAGNOSIS — S82831D Other fracture of upper and lower end of right fibula, subsequent encounter for closed fracture with routine healing: Secondary | ICD-10-CM | POA: Diagnosis not present

## 2024-04-12 DIAGNOSIS — R2681 Unsteadiness on feet: Secondary | ICD-10-CM | POA: Diagnosis not present

## 2024-04-12 DIAGNOSIS — I5032 Chronic diastolic (congestive) heart failure: Secondary | ICD-10-CM | POA: Diagnosis not present

## 2024-04-12 DIAGNOSIS — I2510 Atherosclerotic heart disease of native coronary artery without angina pectoris: Secondary | ICD-10-CM | POA: Diagnosis not present

## 2024-04-12 DIAGNOSIS — D75839 Thrombocytosis, unspecified: Secondary | ICD-10-CM | POA: Diagnosis not present

## 2024-04-12 DIAGNOSIS — I10 Essential (primary) hypertension: Secondary | ICD-10-CM | POA: Diagnosis not present

## 2024-04-12 DIAGNOSIS — I429 Cardiomyopathy, unspecified: Secondary | ICD-10-CM | POA: Diagnosis not present

## 2024-04-12 DIAGNOSIS — I480 Paroxysmal atrial fibrillation: Secondary | ICD-10-CM | POA: Diagnosis not present

## 2024-04-12 DIAGNOSIS — N183 Chronic kidney disease, stage 3 unspecified: Secondary | ICD-10-CM | POA: Diagnosis not present

## 2024-04-12 DIAGNOSIS — Z8542 Personal history of malignant neoplasm of other parts of uterus: Secondary | ICD-10-CM | POA: Diagnosis not present

## 2024-04-12 DIAGNOSIS — Z743 Need for continuous supervision: Secondary | ICD-10-CM | POA: Diagnosis not present

## 2024-04-12 DIAGNOSIS — B965 Pseudomonas (aeruginosa) (mallei) (pseudomallei) as the cause of diseases classified elsewhere: Secondary | ICD-10-CM | POA: Diagnosis not present

## 2024-04-12 DIAGNOSIS — A498 Other bacterial infections of unspecified site: Secondary | ICD-10-CM | POA: Diagnosis not present

## 2024-04-12 DIAGNOSIS — E785 Hyperlipidemia, unspecified: Secondary | ICD-10-CM | POA: Diagnosis not present

## 2024-04-12 DIAGNOSIS — Z452 Encounter for adjustment and management of vascular access device: Secondary | ICD-10-CM | POA: Diagnosis not present

## 2024-04-12 DIAGNOSIS — E559 Vitamin D deficiency, unspecified: Secondary | ICD-10-CM | POA: Diagnosis not present

## 2024-04-12 DIAGNOSIS — N8110 Cystocele, unspecified: Secondary | ICD-10-CM | POA: Diagnosis not present

## 2024-04-12 DIAGNOSIS — M6281 Muscle weakness (generalized): Secondary | ICD-10-CM | POA: Diagnosis not present

## 2024-04-12 DIAGNOSIS — G8918 Other acute postprocedural pain: Secondary | ICD-10-CM | POA: Diagnosis not present

## 2024-04-12 DIAGNOSIS — R2689 Other abnormalities of gait and mobility: Secondary | ICD-10-CM | POA: Diagnosis not present

## 2024-04-12 DIAGNOSIS — M810 Age-related osteoporosis without current pathological fracture: Secondary | ICD-10-CM | POA: Diagnosis not present

## 2024-04-12 DIAGNOSIS — E440 Moderate protein-calorie malnutrition: Secondary | ICD-10-CM | POA: Diagnosis not present

## 2024-04-12 DIAGNOSIS — W19XXXD Unspecified fall, subsequent encounter: Secondary | ICD-10-CM | POA: Diagnosis not present

## 2024-04-12 DIAGNOSIS — R002 Palpitations: Secondary | ICD-10-CM | POA: Diagnosis not present

## 2024-04-12 DIAGNOSIS — R262 Difficulty in walking, not elsewhere classified: Secondary | ICD-10-CM | POA: Diagnosis not present

## 2024-04-12 DIAGNOSIS — I739 Peripheral vascular disease, unspecified: Secondary | ICD-10-CM | POA: Diagnosis not present

## 2024-04-12 DIAGNOSIS — T8149XA Infection following a procedure, other surgical site, initial encounter: Secondary | ICD-10-CM | POA: Diagnosis not present

## 2024-04-12 DIAGNOSIS — I4891 Unspecified atrial fibrillation: Secondary | ICD-10-CM | POA: Diagnosis not present

## 2024-04-12 DIAGNOSIS — I214 Non-ST elevation (NSTEMI) myocardial infarction: Secondary | ICD-10-CM | POA: Diagnosis not present

## 2024-04-12 DIAGNOSIS — R0609 Other forms of dyspnea: Secondary | ICD-10-CM | POA: Diagnosis not present

## 2024-04-12 LAB — AEROBIC/ANAEROBIC CULTURE W GRAM STAIN (SURGICAL/DEEP WOUND)

## 2024-04-12 NOTE — TOC Transition Note (Signed)
 Transition of Care [facility]) - Discharge Note   Patient Details  Name: [name] [name] MRN: [id number] Date of Birth: [birth year]  [facility] CM/SW Contact:  [name] Hals, [name] [name] Phone Number: 04/12/2024, 12:12 PM   Clinical Narrative:   Pt discharging to [facility], room 704.  RN report to [phone].  PTAR called 1210.      Final next level of care: Skilled Nursing Facility Barriers to Discharge: Barriers Resolved   Patient Goals and CMS Choice Patient states their goals for this hospitalization and ongoing recovery are:: SNF   Choice offered to / list presented to : Patient      Discharge Placement              Patient chooses bed at: [facility], [place] Patient to be transferred to facility by: ptar Name of family member notified: husband [name] [name] in room Patient and family notified of of transfer: 04/12/24  Discharge Plan and Services Additional resources added to the After Visit Summary for   In-house Referral: Clinical Social Work                                   Social Drivers of Health (SDOH) Interventions SDOH Screenings   Food Insecurity: No Food Insecurity (04/07/2024)  Housing: Low Risk  (04/07/2024)  Transportation Needs: No Transportation Needs (04/07/2024)  Utilities: Not At Risk (04/07/2024)  Alcohol Screen: Low Risk  (02/16/2024)  Depression (PHQ2-9): Low Risk  (01/19/2019)  Financial Resource Strain: Low Risk  (02/16/2024)  Social Connections: Socially Integrated (04/07/2024)  Tobacco Use: Low Risk  (04/07/2024)     Readmission Risk Interventions     No data to display

## 2024-04-12 NOTE — Progress Notes (Addendum)
 PT Cancellation Note  Patient Details Name: [name] [name] MRN: [id number] DOB: 1941/09/08   Cancelled Treatment:    Reason Eval/Treat Not Completed: Other (comment) Patient was agreeable to attempt, however was waiting for nursing to come assist her to get cleaned up. Will re-attempt later if patient still here. (She has discharge pending insurance auth)  Addendum: re-attempted later in am and patient declines. Discharging this pm.    Talli Kimmer 04/12/2024, 10:43 AM

## 2024-04-12 NOTE — TOC Progression Note (Signed)
 Transition of Care [facility]) - Progression Note    Patient Details  Name: [name] [name] MRN: [id number] Date of Birth: [birth year]  Transition of Care [facility]) CM/SW Contact  Elspeth Hals, LCSW Phone Number: 04/12/2024, 11:24 AM  Clinical Narrative:   SNF auth approved: [id number], 7 days.  PTAR approved: [id number].   CSW confirmed with [facility] that they can receive pt today.   MD/PA informed.      Expected Discharge Plan: Skilled Nursing Facility Barriers to Discharge: Continued Medical Work up  Expected Discharge Plan and Services In-house Referral: Clinical Social Work     Living arrangements for the past 2 months:  (from [employer] short term) Expected Discharge Date: 04/11/24                                     Social Determinants of Health (SDOH) Interventions SDOH Screenings   Food Insecurity: No Food Insecurity (04/07/2024)  Housing: Low Risk  (04/07/2024)  Transportation Needs: No Transportation Needs (04/07/2024)  Utilities: Not At Risk (04/07/2024)  Alcohol Screen: Low Risk  (02/16/2024)  Depression (PHQ2-9): Low Risk  (01/19/2019)  Financial Resource Strain: Low Risk  (02/16/2024)  Social Connections: Socially Integrated (04/07/2024)  Tobacco Use: Low Risk  (04/07/2024)    Readmission Risk Interventions     No data to display

## 2024-04-12 NOTE — Progress Notes (Signed)
 Patient is being discharged via ptar to [facility] [place], patient will be discharging with a picc line, report given to nurse howard, AVS given to ptar.

## 2024-04-15 DIAGNOSIS — R262 Difficulty in walking, not elsewhere classified: Secondary | ICD-10-CM | POA: Diagnosis not present

## 2024-04-15 DIAGNOSIS — T8142XD Infection following a procedure, deep incisional surgical site, subsequent encounter: Secondary | ICD-10-CM | POA: Diagnosis not present

## 2024-04-15 DIAGNOSIS — I4891 Unspecified atrial fibrillation: Secondary | ICD-10-CM | POA: Diagnosis not present

## 2024-04-15 DIAGNOSIS — G8918 Other acute postprocedural pain: Secondary | ICD-10-CM | POA: Diagnosis not present

## 2024-04-28 ENCOUNTER — Encounter: Payer: Self-pay | Admitting: Cardiology

## 2024-04-28 ENCOUNTER — Ambulatory Visit: Attending: Cardiology | Admitting: Cardiology

## 2024-04-28 VITALS — BP 110/70 | HR 71 | Ht 67.0 in | Wt 165.6 lb

## 2024-04-28 DIAGNOSIS — I480 Paroxysmal atrial fibrillation: Secondary | ICD-10-CM

## 2024-04-28 DIAGNOSIS — I429 Cardiomyopathy, unspecified: Secondary | ICD-10-CM | POA: Diagnosis not present

## 2024-04-28 DIAGNOSIS — I2510 Atherosclerotic heart disease of native coronary artery without angina pectoris: Secondary | ICD-10-CM

## 2024-04-28 DIAGNOSIS — I10 Essential (primary) hypertension: Secondary | ICD-10-CM | POA: Diagnosis not present

## 2024-04-28 DIAGNOSIS — R002 Palpitations: Secondary | ICD-10-CM

## 2024-04-28 DIAGNOSIS — R0609 Other forms of dyspnea: Secondary | ICD-10-CM | POA: Diagnosis not present

## 2024-04-28 DIAGNOSIS — E785 Hyperlipidemia, unspecified: Secondary | ICD-10-CM | POA: Diagnosis not present

## 2024-04-28 HISTORY — DX: Cardiomyopathy, unspecified: I42.9

## 2024-04-28 NOTE — Progress Notes (Signed)
 Cardiology Office Note:    Date:  04/28/2024   ID:  [name] [name], DOB 10-31-1940, MRN [id number]  PCP:  Ina Marcellus RAMAN, MD  Cardiologist:  Lamar Fitch, MD    Referring MD: Ina Marcellus RAMAN, MD   No chief complaint on file.   History of Present Illness:    [name] [name] is a 83 y.o. female past medical history significant for paroxysmal atrial fibrillation initially successfully suppressed with flecainide  but then recently she ended up going to the hospital after she fell down and sustained fracture of her leg she was noted to have severely reduced left ventricular ejection fraction 20 to 25%) flecainide  has been replaced with amiodarone .  She does have history of hypertension, dyslipidemia.  She is NURSING home now.  Still struggling with complication of fracture of the right foot, she ended up having hardware removed because of infection required multiple antibiotics cardiac wise doing well denies have any chest pain tightness squeezing pressure burning chest no palpitations no dizziness no swelling of lower extremities  Past Medical History:  Diagnosis Date   Abdominal aortic atherosclerosis (HCC) 05/07/2016   Noted on CT abd/pelvis   Acute renal failure (HCC) 06/04/2013   Acute respiratory failure with hypoxia (HCC) 06/04/2013   Anemia, unspecified 06/08/2013   Anticoagulant long-term use    ELIQUIS    Cancer (HCC)    uterine cancer    Chronic anticoagulation 08/04/2017   Chronic diastolic (congestive) heart failure (HCC)     pt denies    Chronic diastolic congestive heart failure (HCC) 05/31/2015   Chronic kidney disease 2014   arf on dialysis in icu   Complicated UTI (urinary tract infection) 03/31/2021   Complication of anesthesia    2021- kidney removed at [facility] pt reports did not get enough med to wake her up and she could not talk and then recovered after given more of wake up med from anesthesia    Congenital vaginal enterocele 04/15/2016   Coronary  artery disease    Cystocele, midline    followed by dr c. estelle [facility] in [place])  pt uses pessary   Diarrhea 06/11/2013   Diverticulosis 05/07/2016   Noted on CT abd/pelvis   Dyslipidemia 05/31/2015   E coli bacteremia 06/08/2013   Encounter for monitoring flecainide  therapy 07/30/2016   Essential thrombocythemia (HCC) 03/04/2021   Female genital prolapse 04/15/2016   First degree heart block    General weakness    GERD (gastroesophageal reflux disease)    Hematuria    intermittently due to ureter right stent   Hiatal hernia    High risk medication use 04/15/2016   History of arteriovenostomy for renal dialysis [facility])    History of colon polyps    History of gastric polyp    History of kidney infection    12-31-2018  perinephrtic abscess  right side  s/p drains x2 01-10-2019,  05/ 2020 drains removed   History of kidney stones    History of peptic ulcer disease    History of septic shock    2011 (pt unaware) and 06-04-2013  w/ acute renal failure   History of uterine cancer    STAGE I  --  S/P TAH W/ BSO  (NO OTHER TX)   History of ventilator dependency 2014   in setting of septic shock   Hydronephrosis 05/07/2016   Hyperlipemia 04/15/2016   Hyperlipidemia    Hypertension    Iron deficiency anemia hematology/ oncology--- dr ezzard at  [facility] in [place]--- per lov note 08-07-2017  stabilized and felt to be more chronic anemia   01/ 2018  dx iron def. anemia  s/p  IV Iron infusion and taking oral iron supplement   Lactic acidosis 06/04/2013   Lumbar compression fracture (HCC)    01-22-2019 per imaging L1  (06-01-2019 per pt currently no pain)   Midline cystocele 04/15/2016   Nonfunctioning kidney 03/23/2020   NSTEMI, initial episode of care [facility]) 06/07/2013   pt denies    Occlusion of ureteral stent (HCC)    PAF (paroxysmal atrial fibrillation) (HCC) DX JUNE 2012   CARDIOLOGIST--  DR BERNIE FLINT--- [facility])  CHADS2   Palpitations 04/15/2016   Paroxysmal atrial fibrillation (HCC) 08/04/2017   Presence of pessary    Pyelonephritis 06/04/2013   Pyelonephritis of left kidney 06/04/2013   Right wrist fracture    per pt fractured on 03-10-2019,  had closed reduction and wearing a brace   Sepsis (HCC) 05/07/2016   Septic shock (HCC) 06/04/2013   IMO SNOMED Dx Update Oct 2024     Septic shock(785.52) 06/04/2013   Tendonitis, Achilles 04/15/2016   Ureteral obstruction, right 06/04/2013   Ureteral stricture, right urologist-- dr alvaro   Chronic--- treated with ureteral stent   Urinary tract infection without hematuria    Uses walker    and wheelchair for longer distance   Ventricular ectopy 12/28/2020   Wears glasses     Past Surgical History:  Procedure Laterality Date   APPENDECTOMY     BACK SURGERY     CARDIOVERSION  09/ 2018   dr bernie   successful ([facility] )   CHOLECYSTECTOMY  2010   COLONOSCOPY W/ POLYPECTOMY  05/2017   CYSTO/  BALLOON DILATION RIGHT URETERAL STRICTURE/ STENT PLACEMENT  06-02-2013   CYSTO/  BILATERAL RETROGRADE PYELOGRAM/ RIGHT URETEROSCOPY AND STENT PLACEMENT  10-04-2005   CYSTO/ LEFT URETEROSCOPIC STONE EXTRACTION  04-03-2006   CYSTOSCOPY W/ RETROGRADES Right 01/03/2019   Procedure: CYSTOSCOPY WITH RETROGRADE PYELOGRAM RIGHT WITH STENT EXCHANGE;  Surgeon: Watt Rush, MD;  Location: [facility];  Service: Urology;  Laterality: Right;   CYSTOSCOPY W/ URETERAL STENT PLACEMENT Right 02/06/2014   Procedure: CYSTOSCOPY WITH RIGHT RETROGRADE PYELOGRAM RIGHT STENT REMOVAL and REPLACEMENT, Right Ureteroscopy;  Surgeon: Arlena LILLETTE Gal, MD;  Location: [facility];  Service: Urology;  Laterality: Right;   CYSTOSCOPY W/ URETERAL STENT PLACEMENT Right 11/26/2016   Procedure: CYSTOSCOPY WITH RETROGRADE PYELOGRAM/URETERAL STENT REPLACEMENT;  Surgeon: Ricardo alvaro, MD;  Location: [facility];  Service: Urology;  Laterality: Right;    CYSTOSCOPY W/ URETERAL STENT PLACEMENT Right 04/24/2017   Procedure: CYSTOSCOPY WITH RETROGRADE PYELOGRAM/URETERAL STENT REPLACEMENT;  Surgeon: alvaro Ricardo, MD;  Location: [facility];  Service: Urology;  Laterality: Right;   CYSTOSCOPY W/ URETERAL STENT PLACEMENT Right 10/07/2017   Procedure: CYSTOSCOPY WITH RETROGRADE PYELOGRAM/URETERAL STENT EXCHANGE;  Surgeon: alvaro Ricardo, MD;  Location[facility];  Service: Urology;  Laterality: Right;   CYSTOSCOPY W/ URETERAL STENT PLACEMENT Right 03/03/2018   Procedure: CYSTOSCOPY WITH RIGHT RETROGRADE / RIGHT URETERAL STENT EXCHANGE;  Surgeon: alvaro Ricardo, MD;  Location: [facility];  Service: Urology;  Laterality: Right;   CYSTOSCOPY W/ URETERAL STENT PLACEMENT Right 09/22/2018   Procedure: CYSTOSCOPY WITH RETROGRADE PYELOGRAM/URETERAL STENT PLACEMENT;  Surgeon: alvaro Ricardo, MD;  Location: [facility];  Service: Urology;  Laterality: Right;  45 MINS   CYSTOSCOPY W/ URETERAL STENT PLACEMENT Right 06/03/2019  Procedure: CYSTOSCOPY WITH RETROGRADE PYELOGRAM/URETERAL STENT REPLACEMENT;  Surgeon: Alvaro Hummer, MD;  Location: [facility];  Service: Urology;  Laterality: Right;   CYSTOSCOPY W/ URETERAL STENT PLACEMENT Right 11/23/2019   Procedure: CYSTOSCOPY WITH RETROGRADE PYELOGRAM/URETERAL STENT PLACEMENT;  Surgeon: Alvaro Hummer, MD;  Location: [facility];  Service: Urology;  Laterality: Right;  45 MINS   CYSTOSCOPY WITH LITHOLAPAXY N/A 11/21/2013   Procedure: CYSTOSCOPY WITH LITHOLAPAXY;  Surgeon: Arlena LILLETTE Gal, MD;  Location: [facility];  Service: Urology;  Laterality: N/A;   CYSTOSCOPY WITH RETROGRADE PYELOGRAM, URETEROSCOPY AND STENT PLACEMENT Bilateral 03/15/2014   Procedure: CYSTOSCOPY WITH BILATERAL RETROGRADE PYELOGRAM, RIGHT DIAGNOSTIC URETEROSCOPY AND STENT EXCHANGE;  Surgeon: Hummer Alvaro, MD;  [facility];  Service: Urology;   Laterality: Bilateral;   CYSTOSCOPY WITH RETROGRADE PYELOGRAM, URETEROSCOPY AND STENT PLACEMENT Left 03/13/2021   Procedure: FIRST STAGE: CYSTOSCOPY WITH RETROGRADE PYELOGRAM, URETEROSCOPY AND STENT PLACEMENT;  Surgeon: Alvaro Hummer, MD;  [facility];  Service: Urology;  Laterality: Left;  75 MINS   CYSTOSCOPY WITH RETROGRADE PYELOGRAM, URETEROSCOPY AND STENT PLACEMENT Left 03/29/2021   Procedure: SECOND STGAE:CYSTOSCOPY WITH RETROGRADE PYELOGRAM, URETEROSCOPY AND STENT EXCHANGE;  Surgeon: Alvaro Hummer, MD;  [facility];  Service: Urology;  Laterality: Left;   CYSTOSCOPY WITH STENT PLACEMENT Right 05/07/2016   Procedure: CYSTOSCOPY WITH RETROGRADE PYELOGRAM, URETERAL STENT PLACEMENT;  Surgeon: Garnette Shack, MD;  Location[facility];  Service: Urology;  Laterality: Right;   EYE SURGERY     Bilateral cataract removal   HARDWARE REMOVAL Right 04/07/2024   Procedure: REMOVAL, HARDWARE RIGHT TIBIA;  Surgeon: Kendal Franky SQUIBB, MD;  Location[facility];  Service: Orthopedics;  Laterality: Right;   HEMIARTHROPLASTY HIP Right 11/2016   fractured   HOLMIUM LASER APPLICATION Left 03/13/2021   Procedure: HOLMIUM LASER APPLICATION;  Surgeon: Alvaro Hummer, MD;  Location: [facility];  Service: Urology;  Laterality: Left;   INCISION AND DRAINAGE OF WOUND Right 04/07/2024   Procedure: IRRIGATION AND DEBRIDEMENT WOUND;  Surgeon: Kendal Franky SQUIBB, MD;  Location: [facility];  Service: Orthopedics;  Laterality: Right;   IR CATHETER TUBE CHANGE  02/04/2019   IR CATHETER TUBE CHANGE  02/04/2019   IR CATHETER TUBE CHANGE  02/11/2019   IR RADIOLOGIST EVAL & MGMT  03/09/2019   JOINT REPLACEMENT     LUMBAR FUSION  JULY 2013   NEPHROLITHOTOMY  X2 YRS AGO   OPEN REDUCTION INTERNAL FIXATION (ORIF) TIBIA/FIBULA FRACTURE Right 02/15/2024   Procedure: OPEN REDUCTION INTERNAL FIXATION (ORIF) TIBIA/FIBULA FRACTURE;  Surgeon: Kendal Franky SQUIBB, MD;  Location: [facility];  Service: Orthopedics;  Laterality: Right;   PERCUTANEOUS  NEPHROSTOMY  12/2018   ROBOT ASSISTED LAPAROSCOPIC NEPHRECTOMY Right 03/23/2020   Procedure: XI ROBOTIC ASSISTED LAPAROSCOPIC NEPHRECTOMY;  Surgeon: Alvaro Hummer, MD;  Location[facility];  Service: Urology;  Laterality: Right;   TOTAL ABDOMINAL HYSTERECTOMY W/ BILATERAL SALPINGOOPHORECTOMY  1989   TOTAL KNEE ARTHROPLASTY Bilateral 1992  &  1996   TRANSTHORACIC ECHOCARDIOGRAM  07-24-2017   dr bernie   ef 60-65% (improved from last echo 2014, was 45-50%)/  trace TR/  mild AV sclerosis without stenosis   URETEROSCOPY Right 11/21/2013   Procedure: URETEROSCOPY WITH STENT REMOVAL AND REPLACEMENT;  Surgeon: Arlena LILLETTE Gal, MD;  Location: [facility];  Service: Urology;  Laterality: Right;    Current Medications: Current Meds  Medication Sig   acetaminophen  (TYLENOL ) 500 MG tablet Take 1,000 mg by mouth every 6 (six) hours as needed for moderate  pain or headache.    albuterol  (VENTOLIN  HFA) 108 (90 Base) MCG/ACT inhaler Inhale 2 puffs into the lungs every 4 (four) hours as needed for wheezing or shortness of breath.   aluminum-magnesium  hydroxide-simethicone  (MAALOX) 200-200-20 MG/5ML SUSP Take 30 mLs by mouth every 4 (four) hours as needed (Indigestion Notify MD of no relief after 12 hours).   amiodarone  (PACERONE ) 200 MG tablet Take 1 tablet (200 mg total) by mouth daily.   amitriptyline  (ELAVIL ) 10 MG tablet Take 10 mg by mouth at bedtime.   apixaban  (ELIQUIS ) 2.5 MG TABS tablet Take 1 tablet (2.5 mg total) by mouth 2 (two) times daily.   carvedilol  (COREG ) 3.125 MG tablet Take 1 tablet (3.125 mg total) by mouth 2 (two) times daily with a meal.   ceFEPime  (MAXIPIME ) IVPB Inject 2 g into the vein every 12 (twelve) hours for 26 days. Indication:  R ankle wound s/p I and D and hardware removal- 2/2 Pseudomonas aeruginosa  First Dose: Yes Last Day of Therapy:  05/06/24 Labs - Once weekly:  CBC/D and BMP, Labs - Once weekly: ESR and CRP Method of administration: IV Push Method  of administration may be changed at the discretion of home infusion pharmacist based upon assessment of the patient and/or caregiver's ability to self-administer the medication ordered.   CRANBERRY-VITAMIN C-INULIN PO Take 30 mLs by mouth 2 (two) times daily.   feeding supplement (BOOST / RESOURCE BREEZE) LIQD Take 1 Container by mouth daily.   [Paused] fosfomycin (MONUROL) 3 g PACK Take 3 g by mouth every Thursday.   furosemide  (LASIX ) 20 MG tablet Take 20 mg by mouth daily as needed (Weight gain of 3LB or Greater).   hydrALAZINE  (APRESOLINE ) 25 MG tablet Take 25 mg by mouth 2 (two) times daily.   hydrocortisone  (ANUSOL -HC) 25 MG suppository Place 25 mg rectally every 8 (eight) hours as needed for hemorrhoids or anal itching.   hydroxyurea  (HYDREA ) 500 MG capsule TAKE ONE CAPSULE BY MOUTH EVERY MONDAY, WEDNESDAY, AND FRIDAY FOR ELEVATED PLATELETS   loperamide  (IMODIUM  A-D) 2 MG tablet Take 4 mg by mouth every 6 (six) hours as needed for diarrhea or loose stools.   methocarbamol  (ROBAXIN ) 500 MG tablet Take 1 tablet (500 mg total) by mouth every 8 (eight) hours as needed for muscle spasms.   mirtazapine  (REMERON ) 15 MG tablet Take 15 mg by mouth at bedtime.   omeprazole (PRILOSEC) 40 MG capsule Take 40 mg by mouth daily.   ondansetron  (ZOFRAN -ODT) 4 MG disintegrating tablet Take 4 mg by mouth every 6 (six) hours as needed for nausea/vomiting.   OVER THE COUNTER MEDICATION daily as needed. Magic cup/Boost pudding or   oxyCODONE  (OXY IR/ROXICODONE ) 5 MG immediate release tablet Take 0.5-1 tablets (2.5-5 mg total) by mouth every 4 (four) hours as needed (2.5 mg pain socre 4-6, 5 mg pain score 7-10).   polyethylene glycol (MIRALAX  / GLYCOLAX ) 17 g packet Take 17 g by mouth daily as needed for mild constipation.   senna-docusate (SENOKOT-S) 8.6-50 MG tablet Take 1 tablet by mouth 2 (two) times daily. (Patient taking differently: Take 2 tablets by mouth daily as needed for moderate constipation or mild  constipation.)   Spacer/Aero-Holding Chambers (COMPACT SPACE CHAMBER) DEVI Use with the albuterol  inhaler   triamcinolone  cream (KENALOG ) 0.1 % Apply 1 application  topically every 12 (twelve) hours as needed (eczema).     Allergies:   Codeine and Hydrocodone    Social History   Socioeconomic History   Marital status: Married  Spouse name: Helayne   Number of children: 2   Years of education: Not on file   Highest education level: High school graduate  Occupational History   Occupation: Retired from [employer]  Tobacco Use   Smoking status: Never   Smokeless tobacco: Never  Vaping Use   Vaping status: Never Used  Substance and Sexual Activity   Alcohol use: No   Drug use: No   Sexual activity: Not on file  Other Topics Concern   Not on file  Social History Narrative   Lives in [place] with husband. Drives, walks some, but not very active at home.    Social Drivers of Corporate investment banker Strain: Low Risk  (02/16/2024)   Overall Financial Resource Strain (CARDIA)    Difficulty of Paying Living Expenses: Not very hard  Food Insecurity: No Food Insecurity (04/07/2024)   Hunger Vital Sign    Worried About Running Out of Food in the Last Year: Never true    Ran Out of Food in the Last Year: Never true  Transportation Needs: No Transportation Needs (04/07/2024)   PRAPARE - Administrator, Civil Service (Medical): No    Lack of Transportation (Non-Medical): No  Physical Activity: Not on file  Stress: Not on file  Social Connections: Socially Integrated (04/07/2024)   Social Connection and Isolation Panel    Frequency of Communication with Friends and Family: More than three times a week    Frequency of Social Gatherings with Friends and Family: Three times a week    Attends Religious Services: 1 to 4 times per year    Active Member of Clubs or Organizations: Yes    Attends Banker Meetings: 1 to 4 times per year    Marital Status: Married      Family History: The patient's family history includes Cancer in her mother; Heart disease in her father and mother; Heart failure in her father and mother. ROS:   Please see the history of present illness.    All 14 point review of systems negative except as described per history of present illness  EKGs/Labs/Other Studies Reviewed:    EKG Interpretation Date/Time:  Thursday April 28 2024 13:08:48 EDT Ventricular Rate:  71 PR Interval:  206 QRS Duration:  84 QT Interval:  392 QTC Calculation: 425 R Axis:   39  Text Interpretation: Normal sinus rhythm ST & T wave abnormality, consider inferior ischemia ST & T wave abnormality, consider anterolateral ischemia When compared with ECG of 01-Mar-2024 10:54, T wave inversion less evident in Lateral leads QT has shortened Confirmed by Bernie Charleston [phone]) on 04/28/2024 1:15:00 PM    Recent Labs: 02/11/2024: TSH 1.862 02/13/2024: Magnesium  2.2 02/17/2024: ALT 6 03/01/2024: B Natriuretic Peptide 380.1 04/10/2024: BUN 14; Creatinine, Ser 0.96; Hemoglobin 12.4; Platelets 324; Potassium 4.7; Sodium 134  Recent Lipid Panel    Component Value Date/Time   CHOL 179 08/04/2022 0847   TRIG 157 (H) 08/04/2022 0847   HDL 53 08/04/2022 0847   CHOLHDL 3.4 08/04/2022 0847   LDLCALC 99 08/04/2022 0847    Physical Exam:    VS:  BP 110/70 (BP Location: Left Arm, Patient Position: Sitting, Cuff Size: Normal)   Pulse 71   Ht 5' 7 (1.702 m)   Wt 165 lb 9.6 oz (75.1 kg)   SpO2 97%   BMI 25.94 kg/m     Wt Readings from Last 3 Encounters:  04/28/24 165 lb 9.6 oz (75.1 kg)  04/07/24 167  lb (75.8 kg)  03/04/24 181 lb (82.1 kg)     GEN:  Well nourished, well developed in no acute distress HEENT: Normal NECK: No JVD; No carotid bruits LYMPHATICS: No lymphadenopathy CARDIAC: RRR, no murmurs, no rubs, no gallops RESPIRATORY:  Clear to auscultation without rales, wheezing or rhonchi  ABDOMEN: Soft, non-tender, non-distended MUSCULOSKELETAL:  No  edema; No deformity  SKIN: Warm and dry LOWER EXTREMITIES: no swelling NEUROLOGIC:  Alert and oriented x 3 PSYCHIATRIC:  Normal affect   ASSESSMENT:    1. Palpitations   2. Coronary artery disease involving native coronary artery of native heart without angina pectoris   3. Primary hypertension   4. Idiopathic cardiomyopathy (HCC)   5. PAF (paroxysmal atrial fibrillation) (HCC)   6. Dyslipidemia    PLAN:    In order of problems listed above:  Cardiomyopathy which is new discovery 25 to 30% while in the hospital which broken leg suspicion is that this is Takotsubo cardiomyopathy, flecainide  has been withdrawn she was put on amiodarone  also for the fact that she got nonsustained ventricular tachycardia, I will repeat her echocardiogram to recheck left ventricular ejection fraction.  Her blood pressure is very soft have not much room to augment her medical therapy but will check echocardiogram first. Paroxysmal atrial fibrillation seems to maintain sinus rhythm, will continue present management.  That include Eliquis . Essential hypertension blood pressure soft. Dyslipidemia I did review K PN which show LDL 99 HDL 50.  Will wait for situation to stabilize to recheck fasting lipid profile   Medication Adjustments/Labs and Tests Ordered: Current medicines are reviewed at length with the patient today.  Concerns regarding medicines are outlined above.  Orders Placed This Encounter  Procedures   EKG 12-Lead   Medication changes: No orders of the defined types were placed in this encounter.   Signed, Lamar DOROTHA Fitch, MD, [facility] 04/28/2024 1:26 PM    [facility]

## 2024-04-28 NOTE — Patient Instructions (Signed)
 Medication Instructions:  Your physician recommends that you continue on your current medications as directed. Please refer to the Current Medication list given to you today.  *If you need a refill on your cardiac medications before your next appointment, please call your pharmacy*   Lab Work: None Ordered If you have labs (blood work) drawn today and your tests are completely normal, you will receive your results only by: MyChart Message (if you have MyChart) OR A paper copy in the mail If you have any lab test that is abnormal or we need to change your treatment, we will call you to review the results.   Testing/Procedures: Your physician has requested that you have an echocardiogram. Echocardiography is a painless test that uses sound waves to create images of your heart. It provides your doctor with information about the size and shape of your heart and how well your heart's chambers and valves are working. This procedure takes approximately one hour. There are no restrictions for this procedure. Please do NOT wear cologne, perfume, aftershave, or lotions (deodorant is allowed). Please arrive 15 minutes prior to your appointment time.  Please note: We ask at that you not bring children with you during ultrasound (echo/ vascular) testing. Due to room size and safety concerns, children are not allowed in the ultrasound rooms during exams. Our front office staff cannot provide observation of children in our lobby area while testing is being conducted. An adult accompanying a patient to their appointment will only be allowed in the ultrasound room at the discretion of the ultrasound technician under special circumstances. We apologize for any inconvenience.    Follow-Up: At [facility], you and your health needs are our priority.  As part of our continuing mission to provide you with exceptional heart care, we have created designated Provider Care Teams.  These Care Teams include your  primary Cardiologist (physician) and Advanced Practice Providers (APPs -  Physician Assistants and Nurse Practitioners) who all work together to provide you with the care you need, when you need it.  We recommend signing up for the patient portal called "MyChart".  Sign up information is provided on this After Visit Summary.  MyChart is used to connect with patients for Virtual Visits (Telemedicine).  Patients are able to view lab/test results, encounter notes, upcoming appointments, etc.  Non-urgent messages can be sent to your provider as well.   To learn more about what you can do with MyChart, go to [email or web address].    Your next appointment:   3 month(s)  The format for your next appointment:   In Person  Provider:   Gypsy Balsam, MD    Other Instructions NA

## 2024-05-03 DIAGNOSIS — S82301D Unspecified fracture of lower end of right tibia, subsequent encounter for closed fracture with routine healing: Secondary | ICD-10-CM | POA: Diagnosis not present

## 2024-05-03 DIAGNOSIS — S82831D Other fracture of upper and lower end of right fibula, subsequent encounter for closed fracture with routine healing: Secondary | ICD-10-CM | POA: Diagnosis not present

## 2024-05-06 ENCOUNTER — Other Ambulatory Visit: Payer: Self-pay

## 2024-05-06 ENCOUNTER — Encounter: Payer: Self-pay | Admitting: Family

## 2024-05-06 ENCOUNTER — Ambulatory Visit: Payer: Self-pay | Admitting: Family

## 2024-05-06 VITALS — BP 114/77 | HR 77 | Temp 97.7°F | Ht 67.0 in | Wt 165.0 lb

## 2024-05-06 DIAGNOSIS — T8149XA Infection following a procedure, other surgical site, initial encounter: Secondary | ICD-10-CM

## 2024-05-06 DIAGNOSIS — Z452 Encounter for adjustment and management of vascular access device: Secondary | ICD-10-CM

## 2024-05-06 DIAGNOSIS — B965 Pseudomonas (aeruginosa) (mallei) (pseudomallei) as the cause of diseases classified elsewhere: Secondary | ICD-10-CM | POA: Diagnosis not present

## 2024-05-06 DIAGNOSIS — A498 Other bacterial infections of unspecified site: Secondary | ICD-10-CM | POA: Insufficient documentation

## 2024-05-06 HISTORY — DX: Other bacterial infections of unspecified site: A49.8

## 2024-05-06 HISTORY — DX: Infection following a procedure, other surgical site, initial encounter: T81.49XA

## 2024-05-06 HISTORY — DX: Encounter for adjustment and management of vascular access device: Z45.2

## 2024-05-06 NOTE — Patient Instructions (Addendum)
 Nice to see you.  We will check your lab work today.  Will determine need for any additional antibiotics.   Monitor for worsening surgical pain, swelling or drainage.  Follow up pend lab work results if needed.   Have a great day and stay safe!

## 2024-05-06 NOTE — Progress Notes (Signed)
 Subjective:   Patient ID: [name] [name], female    DOB: 07-29-1941, 83 y.o.   MRN: [id number]  Chief Complaint  Patient presents with   Hospitalization Follow-up    Hospital f/u no concerns    HPI:  [name] [name] is a 83 y.o. female recently admitted to the hospital with drainage and warmth of post-operative incision site and found to have Pseudomoas deep tissue infection undergoing hardware removal on 04/07/24 with no new hardware placed. Last seen by Dr. Luiz on 04/10/24 with plan for 4 weeks of Cefepime  via PICC through 05/06/24. Here today for hospital follow up.  [name] [name] is present today with her husband and arrives from [facility]. Has been receiving Cefepime  as prescribed with no adverse side effects. PICC line functioning appropriately. Last seen by Orthopedics on Wednesday with good healing. No fevers, chills or night sweats. Weight baring remains an issue with pain with standing and is limited secondary to problems with left knee.   Allergies  Allergen Reactions   Codeine Nausea Only and Other (See Comments)    hallucinations   Hydrocodone  Nausea Only and Other (See Comments)      Outpatient Medications Prior to Visit  Medication Sig Dispense Refill   acetaminophen  (TYLENOL ) 500 MG tablet Take 1,000 mg by mouth every 6 (six) hours as needed for moderate pain or headache.      albuterol  (VENTOLIN  HFA) 108 (90 Base) MCG/ACT inhaler Inhale 2 puffs into the lungs every 4 (four) hours as needed for wheezing or shortness of breath. 1 each 0   aluminum-magnesium  hydroxide-simethicone  (MAALOX) 200-200-20 MG/5ML SUSP Take 30 mLs by mouth every 4 (four) hours as needed (Indigestion Notify MD of no relief after 12 hours).     amiodarone  (PACERONE ) 200 MG tablet Take 1 tablet (200 mg total) by mouth daily.     amitriptyline  (ELAVIL ) 10 MG tablet Take 10 mg by mouth at bedtime.     apixaban  (ELIQUIS ) 2.5 MG TABS tablet Take 1 tablet (2.5 mg total) by mouth 2  (two) times daily. 60 tablet 5   carvedilol  (COREG ) 3.125 MG tablet Take 1 tablet (3.125 mg total) by mouth 2 (two) times daily with a meal.     ceFEPime  (MAXIPIME ) IVPB Inject 2 g into the vein every 12 (twelve) hours for 26 days. Indication:  R ankle wound s/p I and D and hardware removal- 2/2 Pseudomonas aeruginosa  First Dose: Yes Last Day of Therapy:  05/06/24 Labs - Once weekly:  CBC/D and BMP, Labs - Once weekly: ESR and CRP Method of administration: IV Push Method of administration may be changed at the discretion of home infusion pharmacist based upon assessment of the patient and/or caregiver's ability to self-administer the medication ordered. 53 Units 0   CRANBERRY-VITAMIN C-INULIN PO Take 30 mLs by mouth 2 (two) times daily.     feeding supplement (BOOST / RESOURCE BREEZE) LIQD Take 1 Container by mouth daily.     furosemide  (LASIX ) 20 MG tablet Take 20 mg by mouth daily as needed (Weight gain of 3LB or Greater).     hydrALAZINE  (APRESOLINE ) 25 MG tablet Take 25 mg by mouth 2 (two) times daily.     hydrocortisone  (ANUSOL -HC) 25 MG suppository Place 25 mg rectally every 8 (eight) hours as needed for hemorrhoids or anal itching.     hydroxyurea  (HYDREA ) 500 MG capsule TAKE ONE CAPSULE BY MOUTH EVERY MONDAY, WEDNESDAY, AND FRIDAY FOR ELEVATED PLATELETS 36 capsule 2   loperamide  (IMODIUM   A-D) 2 MG tablet Take 4 mg by mouth every 6 (six) hours as needed for diarrhea or loose stools.     methocarbamol  (ROBAXIN ) 500 MG tablet Take 1 tablet (500 mg total) by mouth every 8 (eight) hours as needed for muscle spasms. 20 tablet 0   mirtazapine  (REMERON ) 15 MG tablet Take 15 mg by mouth at bedtime.     omeprazole (PRILOSEC) 40 MG capsule Take 40 mg by mouth daily.     ondansetron  (ZOFRAN -ODT) 4 MG disintegrating tablet Take 4 mg by mouth every 6 (six) hours as needed for nausea/vomiting.     OVER THE COUNTER MEDICATION daily as needed. Magic cup/Boost pudding or     oxyCODONE  (OXY IR/ROXICODONE )  5 MG immediate release tablet Take 0.5-1 tablets (2.5-5 mg total) by mouth every 4 (four) hours as needed (2.5 mg pain socre 4-6, 5 mg pain score 7-10). 30 tablet 0   polyethylene glycol (MIRALAX  / GLYCOLAX ) 17 g packet Take 17 g by mouth daily as needed for mild constipation.     senna-docusate (SENOKOT-S) 8.6-50 MG tablet Take 1 tablet by mouth 2 (two) times daily. (Patient taking differently: Take 2 tablets by mouth daily as needed for moderate constipation or mild constipation.)     Spacer/Aero-Holding Chambers (COMPACT SPACE CHAMBER) DEVI Use with the albuterol  inhaler 1 each 0   triamcinolone  cream (KENALOG ) 0.1 % Apply 1 application  topically every 12 (twelve) hours as needed (eczema).     fosfomycin (MONUROL) 3 g PACK Take 3 g by mouth every Thursday.     No facility-administered medications prior to visit.     Past Medical History:  Diagnosis Date   Abdominal aortic atherosclerosis (HCC) 05/07/2016   Noted on CT abd/pelvis   Acute renal failure (HCC) 06/04/2013   Acute respiratory failure with hypoxia (HCC) 06/04/2013   Anemia, unspecified 06/08/2013   Anticoagulant long-term use    ELIQUIS    Cancer (HCC)    uterine cancer    Chronic anticoagulation 08/04/2017   Chronic diastolic (congestive) heart failure (HCC)     pt denies    Chronic diastolic congestive heart failure (HCC) 05/31/2015   Chronic kidney disease 2014   arf on dialysis in icu   Complicated UTI (urinary tract infection) 03/31/2021   Complication of anesthesia    2021- kidney removed at [facility] pt reports did not get enough med to wake her up and she could not talk and then recovered after given more of wake up med from anesthesia    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 estelle [facility] in [place])  pt uses pessary   Diarrhea 06/11/2013   Diverticulosis 05/07/2016   Noted on CT abd/pelvis   Dyslipidemia  05/31/2015   E coli bacteremia 06/08/2013   Encounter for monitoring flecainide  therapy 07/30/2016   Essential thrombocythemia (HCC) 03/04/2021   Female genital prolapse 04/15/2016   First degree heart block    General weakness    GERD (gastroesophageal reflux disease)    Hematuria    intermittently due to ureter right stent   Hiatal hernia    High risk medication use 04/15/2016   History of arteriovenostomy for renal dialysis [facility])    History of colon polyps    History of gastric polyp    History of kidney infection    12-31-2018  perinephrtic abscess  right side  s/p drains x2 01-10-2019,  05/  2020 drains removed   History of kidney stones    History of peptic ulcer disease    History of septic shock    2011 (pt unaware) and 06-04-2013  w/ acute renal failure   History of uterine cancer    STAGE I  --  S/P TAH W/ BSO  (NO OTHER TX)   History of ventilator dependency 2014   in setting of septic shock   Hydronephrosis 05/07/2016   Hyperlipemia 04/15/2016   Hyperlipidemia    Hypertension    Iron deficiency anemia hematology/ oncology--- dr ezzard at [facility] in [place]--- per lov note 08-07-2017  stabilized and felt to be more chronic anemia   01/ 2018  dx iron def. anemia  s/p  IV Iron infusion and taking oral iron supplement   Lactic acidosis 06/04/2013   Lumbar compression fracture (HCC)    01-22-2019 per imaging L1  (06-01-2019 per pt currently no pain)   Midline cystocele 04/15/2016   Nonfunctioning kidney 03/23/2020   NSTEMI, initial episode of care [facility]) 06/07/2013   pt denies    Occlusion of ureteral stent (HCC)    PAF (paroxysmal atrial fibrillation) (HCC) DX JUNE 2012   CARDIOLOGIST--  DR BERNIE FLINT--- [place] [facility])  CHADS2   Palpitations 04/15/2016   Paroxysmal atrial fibrillation (HCC) 08/04/2017   Presence of pessary    Pyelonephritis 06/04/2013   Pyelonephritis of left kidney 06/04/2013   Right wrist fracture    per  pt fractured on 03-10-2019,  had closed reduction and wearing a brace   Sepsis (HCC) 05/07/2016   Septic shock (HCC) 06/04/2013   IMO SNOMED Dx Update Oct 2024     Septic shock(785.52) 06/04/2013   Tendonitis, Achilles 04/15/2016   Ureteral obstruction, right 06/04/2013   Ureteral stricture, right urologist-- dr alvaro   Chronic--- treated with ureteral stent   Urinary tract infection without hematuria    Uses walker    and wheelchair for longer distance   Ventricular ectopy 12/28/2020   Wears glasses      Past Surgical History:  Procedure Laterality Date   APPENDECTOMY     BACK SURGERY     CARDIOVERSION  09/ 2018   dr bernie   successful ([facility] )   CHOLECYSTECTOMY  2010   COLONOSCOPY W/ POLYPECTOMY  05/2017   CYSTO/  BALLOON DILATION RIGHT URETERAL STRICTURE/ STENT PLACEMENT  06-02-2013   CYSTO/  BILATERAL RETROGRADE PYELOGRAM/ RIGHT URETEROSCOPY AND STENT PLACEMENT  10-04-2005   CYSTO/ LEFT URETEROSCOPIC STONE EXTRACTION  04-03-2006   CYSTOSCOPY W/ RETROGRADES Right 01/03/2019   Procedure: CYSTOSCOPY WITH RETROGRADE PYELOGRAM RIGHT WITH STENT EXCHANGE;  Surgeon: Watt Rush, MD;  Location: [facility];  Service: Urology;  Laterality: Right;   CYSTOSCOPY W/ URETERAL STENT PLACEMENT Right 02/06/2014   Procedure: CYSTOSCOPY WITH RIGHT RETROGRADE PYELOGRAM RIGHT STENT REMOVAL and REPLACEMENT, Right Ureteroscopy;  Surgeon: Arlena LILLETTE Gal, MD;  Location: [facility];  Service: Urology;  Laterality: Right;   CYSTOSCOPY W/ URETERAL STENT PLACEMENT Right 11/26/2016   Procedure: CYSTOSCOPY WITH RETROGRADE PYELOGRAM/URETERAL STENT REPLACEMENT;  Surgeon: Ricardo alvaro, MD;  Location: [facility];  Service: Urology;  Laterality: Right;   CYSTOSCOPY W/ URETERAL STENT PLACEMENT Right 04/24/2017   Procedure: CYSTOSCOPY WITH RETROGRADE PYELOGRAM/URETERAL STENT REPLACEMENT;  Surgeon: alvaro Ricardo, MD;  Location: [facility];  Service: Urology;   Laterality: Right;   CYSTOSCOPY W/ URETERAL STENT PLACEMENT Right 10/07/2017   Procedure: CYSTOSCOPY WITH RETROGRADE PYELOGRAM/URETERAL STENT EXCHANGE;  Surgeon: Alvaro Hummer, MD;  Location: [facility];  Service: Urology;  Laterality: Right;   CYSTOSCOPY W/ URETERAL STENT PLACEMENT Right 03/03/2018   Procedure: CYSTOSCOPY WITH RIGHT RETROGRADE / RIGHT URETERAL STENT EXCHANGE;  Surgeon: Alvaro Hummer, MD;  Location: [facility];  Service: Urology;  Laterality: Right;   CYSTOSCOPY W/ URETERAL STENT PLACEMENT Right 09/22/2018   Procedure: CYSTOSCOPY WITH RETROGRADE PYELOGRAM/URETERAL STENT PLACEMENT;  Surgeon: Alvaro Hummer, MD;  Location: [facility];  Service: Urology;  Laterality: Right;  45 MINS   CYSTOSCOPY W/ URETERAL STENT PLACEMENT Right 06/03/2019   Procedure: CYSTOSCOPY WITH RETROGRADE PYELOGRAM/URETERAL STENT REPLACEMENT;  Surgeon: Alvaro Hummer, MD;  Location: [facility];  Service: Urology;  Laterality: Right;   CYSTOSCOPY W/ URETERAL STENT PLACEMENT Right 11/23/2019   Procedure: CYSTOSCOPY WITH RETROGRADE PYELOGRAM/URETERAL STENT PLACEMENT;  Surgeon: Alvaro Hummer, MD;  Location: [facility];  Service: Urology;  Laterality: Right;  45 MINS   CYSTOSCOPY WITH LITHOLAPAXY N/A 11/21/2013   Procedure: CYSTOSCOPY WITH LITHOLAPAXY;  Surgeon: Arlena LILLETTE Gal, MD;  Location: [facility];  Service: Urology;  Laterality: N/A;   CYSTOSCOPY WITH RETROGRADE PYELOGRAM, URETEROSCOPY AND STENT PLACEMENT Bilateral 03/15/2014   Procedure: CYSTOSCOPY WITH BILATERAL RETROGRADE PYELOGRAM, RIGHT DIAGNOSTIC URETEROSCOPY AND STENT EXCHANGE;  Surgeon: Hummer Alvaro, MD;  [facility];  Service: Urology;  Laterality: Bilateral;   CYSTOSCOPY WITH RETROGRADE PYELOGRAM, URETEROSCOPY AND STENT PLACEMENT Left 03/13/2021   Procedure: FIRST STAGE: CYSTOSCOPY WITH RETROGRADE PYELOGRAM, URETEROSCOPY AND STENT PLACEMENT;  Surgeon: Alvaro Hummer, MD;  Location: [facility];  Service: Urology;  Laterality: Left;  75 MINS   CYSTOSCOPY WITH RETROGRADE PYELOGRAM, URETEROSCOPY AND STENT PLACEMENT Left 03/29/2021   Procedure: SECOND STGAE:CYSTOSCOPY WITH RETROGRADE PYELOGRAM, URETEROSCOPY AND STENT EXCHANGE;  Surgeon: Alvaro Hummer, MD;  Location[facility];  Service: Urology;  Laterality: Left;   CYSTOSCOPY WITH STENT PLACEMENT Right 05/07/2016   Procedure: CYSTOSCOPY WITH RETROGRADE PYELOGRAM, URETERAL STENT PLACEMENT;  Surgeon: Garnette Shack, MD;  Location[facility];  Service: Urology;  Laterality: Right;   EYE SURGERY     Bilateral cataract removal   HARDWARE REMOVAL Right 04/07/2024   Procedure: REMOVAL, HARDWARE RIGHT TIBIA;  Surgeon: Kendal Franky SQUIBB, MD;  Location: [facility];  Service: Orthopedics;  Laterality: Right;   HEMIARTHROPLASTY HIP Right 11/2016   fractured   HOLMIUM LASER APPLICATION Left 03/13/2021   Procedure: HOLMIUM LASER APPLICATION;  Surgeon: Alvaro Hummer, MD;  Location: [facility];  Service: Urology;  Laterality: Left;   INCISION AND DRAINAGE OF WOUND Right 04/07/2024   Procedure: IRRIGATION AND DEBRIDEMENT WOUND;  Surgeon: Kendal Franky SQUIBB, MD;  Location: [facility];  Service: Orthopedics;  Laterality: Right;   IR CATHETER TUBE CHANGE  02/04/2019   IR CATHETER TUBE CHANGE  02/04/2019   IR CATHETER TUBE CHANGE  02/11/2019   IR RADIOLOGIST EVAL & MGMT  03/09/2019   JOINT REPLACEMENT     LUMBAR FUSION  JULY 2013   NEPHROLITHOTOMY  X2 YRS AGO   OPEN REDUCTION INTERNAL FIXATION (ORIF) TIBIA/FIBULA FRACTURE Right 02/15/2024   Procedure: OPEN REDUCTION INTERNAL FIXATION (ORIF) TIBIA/FIBULA FRACTURE;  Surgeon: Kendal Franky SQUIBB, MD;  Location: [facility];  Service: Orthopedics;  Laterality: Right;   PERCUTANEOUS NEPHROSTOMY  12/2018   ROBOT ASSISTED LAPAROSCOPIC NEPHRECTOMY Right 03/23/2020   Procedure: XI ROBOTIC ASSISTED LAPAROSCOPIC NEPHRECTOMY;  Surgeon: Alvaro Hummer, MD;  Location[facility];  Service: Urology;  Laterality: Right;   TOTAL  ABDOMINAL HYSTERECTOMY W/ BILATERAL SALPINGOOPHORECTOMY  1989  TOTAL KNEE ARTHROPLASTY Bilateral 1992  &  1996   TRANSTHORACIC ECHOCARDIOGRAM  07-24-2017   dr bernie   ef 60-65% (improved from last echo 2014, was 45-50%)/  trace TR/  mild AV sclerosis without stenosis   URETEROSCOPY Right 11/21/2013   Procedure: URETEROSCOPY WITH STENT REMOVAL AND REPLACEMENT;  Surgeon: Arlena LILLETTE Gal, MD;  Location: [facility];  Service: Urology;  Laterality: Right;       Review of Systems  Constitutional:  Negative for chills, diaphoresis, fatigue and fever.  Respiratory:  Negative for cough, chest tightness, shortness of breath and wheezing.   Cardiovascular:  Negative for chest pain.  Gastrointestinal:  Negative for abdominal pain, diarrhea, nausea and vomiting.    Objective:   BP 114/77   Pulse 77   Temp 97.7 F (36.5 C) (Oral)   Ht 5' 7 (1.702 m)   Wt 165 lb (74.8 kg)   SpO2 99%   BMI 25.84 kg/m  Nursing note and vital signs reviewed.  Physical Exam Constitutional:      General: She is not in acute distress.    Appearance: She is well-developed.     Comments: Seated in the wheelchair; pleasant.   Cardiovascular:     Rate and Rhythm: Normal rate and regular rhythm.     Heart sounds: Normal heart sounds.  Pulmonary:     Effort: Pulmonary effort is normal.     Breath sounds: Normal breath sounds.  Skin:    General: Skin is warm and dry.  Neurological:     Mental Status: She is alert and oriented to person, place, and time.  Psychiatric:        Mood and Affect: Mood normal.      Assessment & Plan:    Patient Active Problem List   Diagnosis Date Noted   Surgical wound infection 05/06/2024   Pseudomonas infection 05/06/2024   Peripherally inserted central catheter (PICC) in place 05/06/2024   Idiopathic cardiomyopathy (HCC) 04/28/2024   Exposed orthopaedic hardware (HCC) 04/07/2024   NSVT (nonsustained ventricular tachycardia) (HCC) 02/11/2024    Closed fracture of distal end of right fibula and tibia 02/11/2024   Sepsis secondary to UTI (HCC) 02/11/2024   Complicated UTI (urinary tract infection) 03/31/2021   Cancer (HCC)    Complication of anesthesia    Hypertension    Essential thrombocythemia (HCC) 03/04/2021   Ventricular ectopy 12/28/2020   Presence of pessary    Wears glasses    Uses walker    Right wrist fracture    PAF (paroxysmal atrial fibrillation) (HCC)    Lumbar compression fracture (HCC)    Iron deficiency anemia    Hyperlipidemia    History of uterine cancer    History of septic shock    History of peptic ulcer disease    History of kidney infection    History of kidney stones    History of gastric polyp    History of colon polyps    History of arteriovenostomy for renal dialysis (HCC)    Hiatal hernia    Hematuria    GERD (gastroesophageal reflux disease)    General weakness    First degree heart block    Cystocele, midline    Chronic diastolic (congestive) heart failure (HCC)    Anticoagulant long-term use    Nonfunctioning kidney 03/23/2020   Urinary tract infection without hematuria    Occlusion of ureteral stent (HCC)    Chronic anticoagulation 08/04/2017   Coronary artery disease 06/16/2017   Encounter  for monitoring flecainide  therapy 07/30/2016   Ureteral stricture, right 05/07/2016   Sepsis (HCC) 05/07/2016   Hydronephrosis 05/07/2016   Diverticulosis 05/07/2016   Abdominal aortic atherosclerosis (HCC) 05/07/2016   Congenital vaginal enterocele 04/15/2016   Female genital prolapse 04/15/2016   High risk medication use 04/15/2016   Hyperlipemia 04/15/2016   Midline cystocele 04/15/2016   Palpitations 04/15/2016   Tendonitis, Achilles 04/15/2016   Paroxysmal atrial fibrillation (HCC) 05/31/2015   Chronic diastolic congestive heart failure (HCC) 05/31/2015   Dyslipidemia 05/31/2015   Vomiting and diarrhea 06/11/2013   E coli bacteremia 06/08/2013   Anemia, unspecified 06/08/2013    NSTEMI, initial episode of care [facility]) 06/07/2013   Septic shock (HCC) 06/04/2013   Acute respiratory failure with hypoxia (HCC) 06/04/2013   Pyelonephritis of left kidney 06/04/2013   Ureteral obstruction, right 06/04/2013   Acute renal failure superimposed on stage 3b chronic kidney disease (HCC) 06/04/2013   Lactic acidosis 06/04/2013   History of ventilator dependency 2014   Chronic kidney disease 2014     Problem List Items Addressed This Visit       Other   Surgical wound infection - Primary   [name] [name] is an 83 y/o female with Pseudomonas surgical wound infection s/p 4 weeks of Cefepime  with good adherence and tolerance to medication and good progress noted by Orthopedics with recent visit. No blood work is available for review. Discussed recommended plan of care to remove PICC line today and will check CBC and inflammatory markers to determine any additional need for further treatment which would be oral. Continue wound care per Orthopedics. Plan for follow up pending blood work results if needed. Facility paper work completed and returned.       Relevant Orders   CBC   Sedimentation rate   C-reactive protein   Pseudomonas infection   Relevant Orders   CBC   Sedimentation rate   C-reactive protein   Peripherally inserted central catheter (PICC) in place   PICC line in place and functioning appropriately. Orders provided for removal of PICC line. See Nursing Note.         I am having [name] [name] maintain her amitriptyline , omeprazole, acetaminophen , ondansetron , triamcinolone  cream, hydroxyurea , albuterol , [employer], apixaban , methocarbamol , amiodarone , carvedilol , senna-docusate, polyethylene glycol, feeding supplement, [Paused] fosfomycin, mirtazapine , hydrALAZINE , CRANBERRY-VITAMIN C-INULIN PO, aluminum-magnesium  hydroxide-simethicone , hydrocortisone , loperamide , furosemide , OVER THE COUNTER MEDICATION, oxyCODONE , and ceFEPime .     Follow-up: As  needed pending lab work results.    Cathlyn July, MSN, FNP-C Nurse Practitioner [facility] for Infectious Disease [facility] Main number: [phone]

## 2024-05-06 NOTE — Progress Notes (Signed)
 Labs drawn via PICC line per Cathlyn July, NP. Line flushed with 10 mL normal saline and clamped. Patient tolerated procedure well.   PICC Removal    PICC length & location: right basilic 41 cm Removed per verbal order from: Cathlyn July, NP  Blood thinners: Eliquis  2.5 mg Platelet count: no recent labs available  Site assessment: Dressing clean and dry. Extremity warm and dry. No redness, drainage, or swelling present at insertion site.   Pre-removal vital signs:  BP: 122/80 HR: 79 SpO2: 96%  Insertion site positioned below level of heart. No sutures present. Insertion site cleaned with CHG, catheter removed and petroleum dressing applied. Tip intact. Pressure held until hemostasis achieved.    Length of catheter removed: 41 cm   Provided patient with after care instructions and precautions print out (via [employer] Key). Reviewed this information with patient and her husband.  Patient verbalized understanding and agreement, all questions answered. Patient tolerated procedure well and remained in clinic under the care of RN 30 minutes post removal.  Post-observation vital signs:  BP: 114/77 HR: 77 SpO2: 99%  Notified [facility] and [facility] pharmacy team of removal.  Duwaine Lowe, BSN, RN

## 2024-05-06 NOTE — Assessment & Plan Note (Addendum)
 [name] [name] is an 83 y/o female with Pseudomonas surgical wound infection s/p 4 weeks of Cefepime  with good adherence and tolerance to medication and good progress noted by Orthopedics with recent visit. No blood work is available for review. Discussed recommended plan of care to remove PICC line today and will check CBC and inflammatory markers to determine any additional need for further treatment which would be oral. Continue wound care per Orthopedics. Plan for follow up pending blood work results if needed. Facility paper work completed and returned.

## 2024-05-06 NOTE — Assessment & Plan Note (Signed)
 PICC line in place and functioning appropriately. Orders provided for removal of PICC line. See Nursing Note.

## 2024-05-07 LAB — CBC
HCT: 42.1 % (ref 35.0–45.0)
Hemoglobin: 13.5 g/dL (ref 11.7–15.5)
MCH: 28.1 pg (ref 27.0–33.0)
MCHC: 32.1 g/dL (ref 32.0–36.0)
MCV: 87.5 fL (ref 80.0–100.0)
MPV: 11.1 fL (ref 7.5–12.5)
Platelets: 461 Thousand/uL — ABNORMAL HIGH (ref 140–400)
RBC: 4.81 Million/uL (ref 3.80–5.10)
RDW: 16.4 % — ABNORMAL HIGH (ref 11.0–15.0)
WBC: 9 Thousand/uL (ref 3.8–10.8)

## 2024-05-07 LAB — SEDIMENTATION RATE: Sed Rate: 17 mm/h (ref 0–30)

## 2024-05-07 LAB — C-REACTIVE PROTEIN: CRP: 5.3 mg/L (ref <8.0)

## 2024-05-09 ENCOUNTER — Ambulatory Visit: Payer: Self-pay | Admitting: Family

## 2024-05-10 ENCOUNTER — Telehealth: Payer: Self-pay | Admitting: Cardiology

## 2024-05-10 DIAGNOSIS — I5032 Chronic diastolic (congestive) heart failure: Secondary | ICD-10-CM | POA: Diagnosis not present

## 2024-05-10 DIAGNOSIS — I252 Old myocardial infarction: Secondary | ICD-10-CM | POA: Diagnosis not present

## 2024-05-10 DIAGNOSIS — N183 Chronic kidney disease, stage 3 unspecified: Secondary | ICD-10-CM | POA: Diagnosis not present

## 2024-05-10 DIAGNOSIS — Z87442 Personal history of urinary calculi: Secondary | ICD-10-CM | POA: Diagnosis not present

## 2024-05-10 DIAGNOSIS — D631 Anemia in chronic kidney disease: Secondary | ICD-10-CM | POA: Diagnosis not present

## 2024-05-10 DIAGNOSIS — D509 Iron deficiency anemia, unspecified: Secondary | ICD-10-CM | POA: Diagnosis not present

## 2024-05-10 DIAGNOSIS — I130 Hypertensive heart and chronic kidney disease with heart failure and stage 1 through stage 4 chronic kidney disease, or unspecified chronic kidney disease: Secondary | ICD-10-CM | POA: Diagnosis not present

## 2024-05-10 DIAGNOSIS — N1330 Unspecified hydronephrosis: Secondary | ICD-10-CM | POA: Diagnosis not present

## 2024-05-10 DIAGNOSIS — I2510 Atherosclerotic heart disease of native coronary artery without angina pectoris: Secondary | ICD-10-CM | POA: Diagnosis not present

## 2024-05-10 DIAGNOSIS — Z9181 History of falling: Secondary | ICD-10-CM | POA: Diagnosis not present

## 2024-05-10 DIAGNOSIS — K219 Gastro-esophageal reflux disease without esophagitis: Secondary | ICD-10-CM | POA: Diagnosis not present

## 2024-05-10 DIAGNOSIS — T8469XD Infection and inflammatory reaction due to internal fixation device of other site, subsequent encounter: Secondary | ICD-10-CM | POA: Diagnosis not present

## 2024-05-10 DIAGNOSIS — I739 Peripheral vascular disease, unspecified: Secondary | ICD-10-CM | POA: Diagnosis not present

## 2024-05-10 DIAGNOSIS — Z860100 Personal history of colon polyps, unspecified: Secondary | ICD-10-CM | POA: Diagnosis not present

## 2024-05-10 DIAGNOSIS — F32A Depression, unspecified: Secondary | ICD-10-CM | POA: Diagnosis not present

## 2024-05-10 DIAGNOSIS — S82831D Other fracture of upper and lower end of right fibula, subsequent encounter for closed fracture with routine healing: Secondary | ICD-10-CM | POA: Diagnosis not present

## 2024-05-10 DIAGNOSIS — I480 Paroxysmal atrial fibrillation: Secondary | ICD-10-CM | POA: Diagnosis not present

## 2024-05-10 DIAGNOSIS — Z8554 Personal history of malignant neoplasm of ureter: Secondary | ICD-10-CM | POA: Diagnosis not present

## 2024-05-10 DIAGNOSIS — Z87440 Personal history of urinary (tract) infections: Secondary | ICD-10-CM | POA: Diagnosis not present

## 2024-05-10 DIAGNOSIS — S82841D Displaced bimalleolar fracture of right lower leg, subsequent encounter for closed fracture with routine healing: Secondary | ICD-10-CM | POA: Diagnosis not present

## 2024-05-10 DIAGNOSIS — E785 Hyperlipidemia, unspecified: Secondary | ICD-10-CM | POA: Diagnosis not present

## 2024-05-10 DIAGNOSIS — Z7901 Long term (current) use of anticoagulants: Secondary | ICD-10-CM | POA: Diagnosis not present

## 2024-05-10 DIAGNOSIS — I440 Atrioventricular block, first degree: Secondary | ICD-10-CM | POA: Diagnosis not present

## 2024-05-10 DIAGNOSIS — F419 Anxiety disorder, unspecified: Secondary | ICD-10-CM | POA: Diagnosis not present

## 2024-05-10 NOTE — Telephone Encounter (Signed)
 Pt was released from [facility] nursing [facility] and would like to know about the pt medication changes. Please advise

## 2024-05-10 NOTE — Telephone Encounter (Signed)
 Spoke with spouse, reviewed medications and rescheduled Echo for 2 weeks out due to just getting out of nursing home and starting new antibiotic and weakness.

## 2024-05-12 NOTE — Telephone Encounter (Signed)
 Received call from patient's daughter, Isaiah Frees [facility]), wanting to know results of blood culture.   Discussed that blood culture was not done at last visit, but that inflammatory markers were drawn. Relayed per Cathlyn July, NP that lab work looked good and that there is no indication for further antibiotics. Isaiah will let patient know.   Maya Scholer, BSN, RN

## 2024-05-16 ENCOUNTER — Other Ambulatory Visit

## 2024-05-17 DIAGNOSIS — N390 Urinary tract infection, site not specified: Secondary | ICD-10-CM | POA: Diagnosis not present

## 2024-05-18 DIAGNOSIS — T8469XD Infection and inflammatory reaction due to internal fixation device of other site, subsequent encounter: Secondary | ICD-10-CM | POA: Diagnosis not present

## 2024-05-18 DIAGNOSIS — I130 Hypertensive heart and chronic kidney disease with heart failure and stage 1 through stage 4 chronic kidney disease, or unspecified chronic kidney disease: Secondary | ICD-10-CM | POA: Diagnosis not present

## 2024-05-18 DIAGNOSIS — S82841D Displaced bimalleolar fracture of right lower leg, subsequent encounter for closed fracture with routine healing: Secondary | ICD-10-CM | POA: Diagnosis not present

## 2024-05-19 ENCOUNTER — Telehealth: Payer: Self-pay | Admitting: Cardiology

## 2024-05-19 DIAGNOSIS — B370 Candidal stomatitis: Secondary | ICD-10-CM | POA: Diagnosis not present

## 2024-05-19 DIAGNOSIS — K219 Gastro-esophageal reflux disease without esophagitis: Secondary | ICD-10-CM | POA: Diagnosis not present

## 2024-05-19 DIAGNOSIS — Z1339 Encounter for screening examination for other mental health and behavioral disorders: Secondary | ICD-10-CM | POA: Diagnosis not present

## 2024-05-19 DIAGNOSIS — E7800 Pure hypercholesterolemia, unspecified: Secondary | ICD-10-CM | POA: Diagnosis not present

## 2024-05-19 DIAGNOSIS — G43109 Migraine with aura, not intractable, without status migrainosus: Secondary | ICD-10-CM | POA: Diagnosis not present

## 2024-05-19 DIAGNOSIS — Z6827 Body mass index (BMI) 27.0-27.9, adult: Secondary | ICD-10-CM | POA: Diagnosis not present

## 2024-05-19 DIAGNOSIS — M8580 Other specified disorders of bone density and structure, unspecified site: Secondary | ICD-10-CM | POA: Diagnosis not present

## 2024-05-19 DIAGNOSIS — Z0000 Encounter for general adult medical examination without abnormal findings: Secondary | ICD-10-CM | POA: Diagnosis not present

## 2024-05-19 DIAGNOSIS — I4891 Unspecified atrial fibrillation: Secondary | ICD-10-CM | POA: Diagnosis not present

## 2024-05-19 DIAGNOSIS — E559 Vitamin D deficiency, unspecified: Secondary | ICD-10-CM | POA: Diagnosis not present

## 2024-05-19 DIAGNOSIS — Z1331 Encounter for screening for depression: Secondary | ICD-10-CM | POA: Diagnosis not present

## 2024-05-19 LAB — LAB REPORT - SCANNED: EGFR: 49

## 2024-05-19 MED ORDER — CARVEDILOL 3.125 MG PO TABS: 3.1250 mg | ORAL_TABLET | Freq: Two times a day (BID) | ORAL | 3 refills | Status: DC

## 2024-05-19 MED ORDER — AMIODARONE HCL 200 MG PO TABS: 200.0000 mg | ORAL_TABLET | Freq: Every day | ORAL | 3 refills | Status: AC

## 2024-05-19 MED ORDER — HYDRALAZINE HCL 25 MG PO TABS: 25.0000 mg | ORAL_TABLET | Freq: Two times a day (BID) | ORAL | 3 refills | Status: DC

## 2024-05-19 NOTE — Telephone Encounter (Signed)
 RX sent to requested Pharmacy

## 2024-05-19 NOTE — Telephone Encounter (Signed)
*  STAT* If patient is at the pharmacy, call can be transferred to refill team.   1. Which medications need to be refilled? (please list name of each medication and dose if known)   amiodarone  (PACERONE ) 200 MG tablet  carvedilol  (COREG ) 3.125 MG tablet   hydrALAZINE  (APRESOLINE ) 25 MG tablet    4. Which pharmacy/location (including street and city if local pharmacy) is medication to be sent to?  [facility] - [place], [place] - [address] Phone: [phone]  Fax: [phone]       5. Do they need a 30 day or 90 day supply? 90

## 2024-05-24 DIAGNOSIS — S82831D Other fracture of upper and lower end of right fibula, subsequent encounter for closed fracture with routine healing: Secondary | ICD-10-CM | POA: Diagnosis not present

## 2024-05-30 ENCOUNTER — Ambulatory Visit: Attending: Cardiology

## 2024-05-30 DIAGNOSIS — R0609 Other forms of dyspnea: Secondary | ICD-10-CM

## 2024-05-30 LAB — ECHOCARDIOGRAM COMPLETE
AR max vel: 2.32 cm2
AV Area VTI: 2.2 cm2
AV Area mean vel: 2.24 cm2
AV Mean grad: 3 mmHg
AV Peak grad: 6.4 mmHg
Ao pk vel: 1.26 m/s
Area-P 1/2: 1.95 cm2
MV VTI: 1.32 cm2
S' Lateral: 2.5 cm

## 2024-06-02 ENCOUNTER — Ambulatory Visit: Payer: Self-pay | Admitting: Cardiology

## 2024-06-03 ENCOUNTER — Telehealth: Payer: Self-pay

## 2024-06-03 NOTE — Telephone Encounter (Signed)
 Left message on My Chart with Echo results per Dr. Vanetta Shawl note. Routed to PCP.

## 2024-06-06 ENCOUNTER — Telehealth: Payer: Self-pay

## 2024-06-06 NOTE — Telephone Encounter (Signed)
 Echo Results reviewed with pt as per Dr. Vanetta Shawl note.  Pt verbalized understanding and had no additional questions. Routed to PCP

## 2024-06-21 DIAGNOSIS — S82201D Unspecified fracture of shaft of right tibia, subsequent encounter for closed fracture with routine healing: Secondary | ICD-10-CM | POA: Diagnosis not present

## 2024-06-21 DIAGNOSIS — S82831D Other fracture of upper and lower end of right fibula, subsequent encounter for closed fracture with routine healing: Secondary | ICD-10-CM | POA: Diagnosis not present

## 2024-06-22 DIAGNOSIS — N390 Urinary tract infection, site not specified: Secondary | ICD-10-CM | POA: Diagnosis not present

## 2024-07-04 DIAGNOSIS — N202 Calculus of kidney with calculus of ureter: Secondary | ICD-10-CM | POA: Diagnosis not present

## 2024-07-04 DIAGNOSIS — N3020 Other chronic cystitis without hematuria: Secondary | ICD-10-CM | POA: Diagnosis not present

## 2024-07-04 DIAGNOSIS — Z905 Acquired absence of kidney: Secondary | ICD-10-CM | POA: Diagnosis not present

## 2024-07-07 ENCOUNTER — Inpatient Hospital Stay: Admitting: Oncology

## 2024-07-07 ENCOUNTER — Inpatient Hospital Stay

## 2024-07-11 DIAGNOSIS — T8469XD Infection and inflammatory reaction due to internal fixation device of other site, subsequent encounter: Secondary | ICD-10-CM | POA: Diagnosis not present

## 2024-07-12 ENCOUNTER — Other Ambulatory Visit: Payer: Self-pay | Admitting: Urology

## 2024-07-12 ENCOUNTER — Telehealth: Payer: Self-pay | Admitting: Cardiology

## 2024-07-12 NOTE — Telephone Encounter (Signed)
   Pre-operative Risk Assessment    Patient Name: [name] [name] [facility]  DOB: November 07, 1940 MRN: [id number]   Date of last office visit: 04/28/24 Date of next office visit: Not yet scheduled   Request for Surgical Clearance    Procedure:  Left Ureteroscopy and Retrograde Pyelogram  Date of Surgery:  Clearance 07/21/24                                Surgeon:  Dr. Ricardo Likens Surgeon's Group or Practice Name:  [facility] Phone number:  [phone]  Fax number:  [phone]   Type of Clearance Requested:   - Medical  - Pharmacy:  Hold Apixaban  (Eliquis )     Type of Anesthesia:  General    Additional requests/questions:     SignedHerma KATHEE Blush   07/12/2024, 12:35 PM

## 2024-07-14 NOTE — Progress Notes (Signed)
 Please place orders for PAT appointment scheduled 07/18/24.

## 2024-07-14 NOTE — Progress Notes (Signed)
 Sent message, via epic in basket, requesting orders in epic from Careers adviser.

## 2024-07-14 NOTE — Progress Notes (Addendum)
 COVID Vaccine Completed:  Date of COVID positive in last 90 days:  PCP - Kevon Baptist, MD Cardiologist - Lamar Fitch, MD Infectious Disease- Jayson July, FNP  Chest x-ray - 02/11/24 Epic EKG - 04/28/24 Epic Stress Test - many years ago per pt ECHO - 05/30/24 Epic Cardiac Cath - yes at [facility] 10-15 years ago per pt Pacemaker/ICD device last checked:N/A Spinal Cord Stimulator:N/A  Bowel Prep - N/A  Sleep Study - N/A CPAP -   Fasting Blood Sugar - N/A Checks Blood Sugar _____ times a day  Last dose of GLP1 agonist-  N/A GLP1 instructions:  Do not take after     Last dose of SGLT-2 inhibitors-  N/A SGLT-2 instructions:  Do not take after     Blood Thinner Instructions: Eliquis , hold 2 days Aspirin  Instructions:N/A Last Dose: 05/17/24 2000  Activity level: Denies chest pain or SOB. Needs assistance with ADLs Patient is bed bound at home. Has a hospital bed. Uses transfer board and assist x1 to get into wheelchair. Came to appointment today in a wheelchair  Anesthesia review: CAD, a fib, CHF, first degree AV block, NSVT, NSTEMI, thrombocytopenia, WBC 19.6, creatinine 1.76  Patient denies shortness of breath, fever, cough and chest pain at PAT appointment  Patient verbalized understanding of instructions that were given to them at the PAT appointment. Patient was also instructed that they will need to review over the PAT instructions again at home before surgery.

## 2024-07-15 MED ORDER — APIXABAN 5 MG PO TABS: 5.0000 mg | ORAL_TABLET | Freq: Two times a day (BID) | ORAL | 5 refills | Status: AC

## 2024-07-15 NOTE — Telephone Encounter (Signed)
 Appointment scheduled on 07/19/24 in [place] with [name] [name].

## 2024-07-15 NOTE — Telephone Encounter (Signed)
   Name: [name] [facility]  DOB: [birth year]  MRN: [id number]  Primary Cardiologist: Lamar Fitch, MD  Chart reviewed as part of pre-operative protocol coverage. Because of [name] [name]'s past medical history and time since last visit, she will require a follow-up in-office visit in order to better assess preoperative cardiovascular risk.  Pre-op covering staff: - Please schedule appointment and call patient to inform them. If patient already had an upcoming appointment within acceptable timeframe, please add pre-op clearance to the appointment notes so provider is aware. - Please contact requesting surgeon's office via preferred method (i.e, phone, fax) to inform them of need for appointment prior to surgery.  Please inform patient that her Eliquis  dose has been increased back to 5mg  twice a day due to improvement in her kidney function.   Per office protocol, patient can hold Eliquis  for 2-3 days prior to procedure.  Please resume when medically safe to do so.    [name], PA-C  07/15/2024, 8:06 AM

## 2024-07-15 NOTE — Telephone Encounter (Signed)
 Patient with diagnosis of afib on Eliquis  for anticoagulation.    Procedure: Left Ureteroscopy and Retrograde Pyelogram  Date of procedure: 07/21/24   CHA2DS2-VASc Score = 5   This indicates a 7.2% annual risk of stroke. The patient's score is based upon: CHF History: 1 HTN History: 1 Diabetes History: 0 Stroke History: 0 Vascular Disease History: 0 Age Score: 2 Gender Score: 1      CrCl 53 ml/min Platelet count 461  Patient has not had an Afib/aflutter ablation or Watchman within the last 3 months or DCCV within the last 30 days   Please inform patient that her Eliquis  dose has been increased back to 5mg  twice a day due to improvement in her kidney function.  Per office protocol, patient can hold Eliquis  for 2-3 days prior to procedure.    **This guidance is not considered finalized until pre-operative APP has relayed final recommendations.**

## 2024-07-18 ENCOUNTER — Encounter (HOSPITAL_COMMUNITY)
Admission: RE | Admit: 2024-07-18 | Discharge: 2024-07-18 | Disposition: A | Discharge status: Home / Self Care | Source: Ambulatory Visit | Attending: Urology | Admitting: Urology

## 2024-07-18 ENCOUNTER — Other Ambulatory Visit: Payer: Self-pay

## 2024-07-18 ENCOUNTER — Encounter (HOSPITAL_COMMUNITY): Payer: Self-pay

## 2024-07-18 VITALS — BP 118/74 | HR 93 | Temp 97.6°F | Resp 16 | Ht 67.0 in | Wt 164.9 lb

## 2024-07-18 DIAGNOSIS — I440 Atrioventricular block, first degree: Secondary | ICD-10-CM | POA: Insufficient documentation

## 2024-07-18 DIAGNOSIS — I252 Old myocardial infarction: Secondary | ICD-10-CM | POA: Diagnosis not present

## 2024-07-18 DIAGNOSIS — Z01812 Encounter for preprocedural laboratory examination: Secondary | ICD-10-CM | POA: Diagnosis not present

## 2024-07-18 DIAGNOSIS — K219 Gastro-esophageal reflux disease without esophagitis: Secondary | ICD-10-CM | POA: Insufficient documentation

## 2024-07-18 DIAGNOSIS — Z7901 Long term (current) use of anticoagulants: Secondary | ICD-10-CM | POA: Diagnosis not present

## 2024-07-18 DIAGNOSIS — N201 Calculus of ureter: Secondary | ICD-10-CM | POA: Insufficient documentation

## 2024-07-18 DIAGNOSIS — Z01818 Encounter for other preprocedural examination: Secondary | ICD-10-CM | POA: Diagnosis present

## 2024-07-18 DIAGNOSIS — I5032 Chronic diastolic (congestive) heart failure: Secondary | ICD-10-CM

## 2024-07-18 DIAGNOSIS — Z905 Acquired absence of kidney: Secondary | ICD-10-CM | POA: Diagnosis not present

## 2024-07-18 DIAGNOSIS — N189 Chronic kidney disease, unspecified: Secondary | ICD-10-CM | POA: Insufficient documentation

## 2024-07-18 DIAGNOSIS — I509 Heart failure, unspecified: Secondary | ICD-10-CM | POA: Diagnosis not present

## 2024-07-18 DIAGNOSIS — I480 Paroxysmal atrial fibrillation: Secondary | ICD-10-CM | POA: Insufficient documentation

## 2024-07-18 DIAGNOSIS — I130 Hypertensive heart and chronic kidney disease with heart failure and stage 1 through stage 4 chronic kidney disease, or unspecified chronic kidney disease: Secondary | ICD-10-CM | POA: Diagnosis not present

## 2024-07-18 HISTORY — DX: Unspecified osteoarthritis, unspecified site: M19.90

## 2024-07-18 HISTORY — DX: Anxiety disorder, unspecified: F41.9

## 2024-07-18 HISTORY — DX: Depression, unspecified: F32.A

## 2024-07-18 LAB — BASIC METABOLIC PANEL WITH GFR
Anion gap: 12 (ref 5–15)
BUN: 27 mg/dL — ABNORMAL HIGH (ref 8–23)
CO2: 23 mmol/L (ref 22–32)
Calcium: 9.9 mg/dL (ref 8.9–10.3)
Chloride: 97 mmol/L — ABNORMAL LOW (ref 98–111)
Creatinine, Ser: 1.76 mg/dL — ABNORMAL HIGH (ref 0.44–1.00)
GFR, Estimated: 28 mL/min — ABNORMAL LOW (ref >60)
Glucose, Bld: 100 mg/dL — ABNORMAL HIGH (ref 70–99)
Potassium: 4.2 mmol/L (ref 3.5–5.1)
Sodium: 132 mmol/L — ABNORMAL LOW (ref 135–145)

## 2024-07-18 LAB — CBC
HCT: 45.7 % (ref 36.0–46.0)
Hemoglobin: 14.5 g/dL (ref 12.0–15.0)
MCH: 29.1 pg (ref 26.0–34.0)
MCHC: 31.7 g/dL (ref 30.0–36.0)
MCV: 91.6 fL (ref 80.0–100.0)
Platelets: 492 K/uL — ABNORMAL HIGH (ref 150–400)
RBC: 4.99 MIL/uL (ref 3.87–5.11)
RDW: 16.7 % — ABNORMAL HIGH (ref 11.5–15.5)
WBC: 19.6 K/uL — ABNORMAL HIGH (ref 4.0–10.5)
nRBC: 0 % (ref 0.0–0.2)

## 2024-07-18 NOTE — Patient Instructions (Signed)
 SURGICAL WAITING ROOM VISITATION  Patients having surgery or a procedure may have no more than 2 support people in the waiting area - these visitors may rotate.    Children under the age of 44 must have an adult with them who is not the patient.  Visitors with respiratory illnesses are discouraged from visiting and should remain at home.  If the patient needs to stay at the hospital during part of their recovery, the visitor guidelines for inpatient rooms apply. Pre-op nurse will coordinate an appropriate time for 1 support person to accompany patient in pre-op.  This support person may not rotate.    Please refer to the [facility] website for the visitor guidelines for Inpatients (after your surgery is over and you are in a regular room).    Your procedure is scheduled on: 07/21/24   Report to [facility] Main Entrance    Report to admitting at 10:45 AM   Call this number if you have problems the morning of surgery [phone]   Do not eat food or drink liquids :After Midnight.          If you have questions, please contact your surgeon's office.   FOLLOW BOWEL PREP AND ANY ADDITIONAL PRE OP INSTRUCTIONS YOU RECEIVED FROM YOUR SURGEON'S OFFICE!!!     Oral Hygiene is also important to reduce your risk of infection.                                    Remember - BRUSH YOUR TEETH THE MORNING OF SURGERY WITH YOUR REGULAR TOOTHPASTE  DENTURES WILL BE REMOVED PRIOR TO SURGERY PLEASE DO NOT APPLY Poly grip OR ADHESIVES!!!   Stop all vitamins and herbal supplements 7 days before surgery.   Take these medicines the morning of surgery with A SIP OF WATER : Tyleno, Amiodarone , Carvedilol , Hydralazine , Omeprazole, Zofran , Oxycodone              You may not have any metal on your body including hair pins, jewelry, and body piercing             Do not wear make-up, lotions, powders, perfumes, or deodorant  Do not wear nail polish including gel and S&S, artificial/acrylic nails,  or any other type of covering on natural nails including finger and toenails. If you have artificial nails, gel coating, etc. that needs to be removed by a nail salon please have this removed prior to surgery or surgery may need to be canceled/ delayed if the surgeon/ anesthesia feels like they are unable to be safely monitored.   Do not shave  48 hours prior to surgery.    Do not bring valuables to the hospital. [place] IS NOT             RESPONSIBLE   FOR VALUABLES.   Contacts, glasses, dentures or bridgework may not be worn into surgery.  DO NOT BRING YOUR HOME MEDICATIONS TO THE HOSPITAL. PHARMACY WILL DISPENSE MEDICATIONS LISTED ON YOUR MEDICATION LIST TO YOU DURING YOUR ADMISSION IN THE HOSPITAL!    Patients discharged on the day of surgery will not be allowed to drive home.  Someone NEEDS to stay with you for the first 24 hours after anesthesia.   Special Instructions: Bring a copy of your healthcare power of attorney and living will documents the day of surgery if you haven't scanned them before.  Please read over the following fact sheets you were given: IF YOU HAVE QUESTIONS ABOUT YOUR PRE-OP INSTRUCTIONS PLEASE CALL [phone].   If you received a COVID test during your pre-op visit  it is requested that you wear a mask when out in public, stay away from anyone that may not be feeling well and notify your surgeon if you develop symptoms. If you test positive for Covid or have been in contact with anyone that has tested positive in the last 10 days please notify you surgeon.    [place] - Preparing for Surgery Before surgery, you can play an important role.  Because skin is not sterile, your skin needs to be as free of germs as possible.  You can reduce the number of germs on your skin by washing with CHG (chlorahexidine gluconate) soap before surgery.  CHG is an antiseptic cleaner which kills germs and bonds with the skin to continue killing germs even  after washing. Please DO NOT use if you have an allergy to CHG or antibacterial soaps.  If your skin becomes reddened/irritated stop using the CHG and inform your nurse when you arrive at Short Stay. Do not shave (including legs and underarms) for at least 48 hours prior to the first CHG shower.  You may shave your face/neck.  Please follow these instructions carefully:  1.  Shower with CHG Soap the night before surgery and the  morning of surgery.  2.  If you choose to wash your hair, wash your hair first as usual with your normal  shampoo.  3.  After you shampoo, rinse your hair and body thoroughly to remove the shampoo.                             4.  Use CHG as you would any other liquid soap.  You can apply chg directly to the skin and wash.  Gently with a scrungie or clean washcloth.  5.  Apply the CHG Soap to your body ONLY FROM THE NECK DOWN.   Do   not use on face/ open                           Wound or open sores. Avoid contact with eyes, ears mouth and   genitals (private parts).                       Wash face,  Genitals (private parts) with your normal soap.             6.  Wash thoroughly, paying special attention to the area where your    surgery  will be performed.  7.  Thoroughly rinse your body with warm water  from the neck down.  8.  DO NOT shower/wash with your normal soap after using and rinsing off the CHG Soap.                9.  Pat yourself dry with a clean towel.            10.  Wear clean pajamas.            11.  Place clean sheets on your bed the night of your first shower and do not  sleep with pets. Day of Surgery : Do not apply any lotions/deodorants the morning of surgery.  Please wear clean clothes to the hospital/surgery center.  FAILURE TO FOLLOW THESE INSTRUCTIONS MAY RESULT IN THE CANCELLATION OF YOUR SURGERY  PATIENT SIGNATURE_________________________________  NURSE  SIGNATURE__________________________________  ________________________________________________________________________

## 2024-07-19 ENCOUNTER — Ambulatory Visit: Admitting: Cardiology

## 2024-07-19 DIAGNOSIS — F32A Depression, unspecified: Secondary | ICD-10-CM | POA: Insufficient documentation

## 2024-07-19 DIAGNOSIS — M1990 Unspecified osteoarthritis, unspecified site: Secondary | ICD-10-CM | POA: Insufficient documentation

## 2024-07-19 DIAGNOSIS — F419 Anxiety disorder, unspecified: Secondary | ICD-10-CM | POA: Insufficient documentation

## 2024-07-19 NOTE — Progress Notes (Unsigned)
 Cardiology Office Note:  .   Date:  07/19/2024  ID:  [name] [name], DOB 1941/09/15, MRN [id number] PCP: Ina Marcellus RAMAN, MD  [facility] Providers Cardiologist:  Lamar Fitch, MD    History of Present Illness: .   [name] [name] is a 83 y.o. female with a past medical history of CAD--type II non-STEMI in 2014, PAF, diastolic heart failure, first-degree AV block, PVCs, hypertension, history of UTI/hydronephrosis, right nephrectomy, thrombocythemia follows with Dr. Ezzard, dyslipidemia.  05/30/2024 echo EF 60-65%, mild LVH, grade I DD 02/12/2024 echo consistent with Takosubo cardiomyopathy, EF 25-30%, small pericardial effusion, trivial MR 08/27/2022 echo EF 60 to 65%, mild concentric LVH, grade 1 DD, aortic dilatation at 30 mm, mild dilatation of the ascending aorta 38 mm 11/20/2020 monitor average heart rate 70 bpm, brief atrial runs, occasional PACs  [name] [name] is a longstanding patient of Dr. Krasowski, primarily followed for paroxysmal atrial fibrillation.  Evaluated by Dr. Fitch on 02/23/2023, she was maintaining sinus rhythm, she was noted to have frequent ventricular ectopy and her beta-blocker dose had been increased but this made her feel worse and was eventually decreased.  She had been evaluated by EP and they recommended increasing her dose of flecainide  100 mg twice daily and this seemed to improve her palpitations.  She was evaluated by myself in November 2024, she was maintaining sinus rhythm, she was actually bothered by abdominal pain that she felt was related to her gallbladder, we repeated lab work for her atrial fibrillation with plans to follow back up in 6 months.  She ended up suffering a fall on 02/11/2024, her blood pressure had apparently been running low at home with associated nausea fatigue, loose stools.  Upon arrival to the ED her WBCs were elevated at 23.7, troponins 1228, positive for UTI, she suffered a distal right tibia/fibula fracture.  She underwent  ORIF that was overall uncomplicated.  Cardiology was consulted secondary to non-STEMI, echocardiogram was obtained revealing an EF of 25 to 30% consistent with Takotsubo cardiomyopathy felt to be stress-induced.  She had an episode of NSVT, her flecainide  was stopped and she was started on amiodarone .   She presents today accompanied by her husband for follow-up after recent hospitalization as outlined above.  She is currently wheelchair-bound secondary to her tib-fib fracture.  She is feeling okay, she was discharged to an SNF for rehab so his she has been residing there.  She just had lab work completed however we do not have the results of this but she was told that everything looked okay.  Was evaluated by the advanced heart failure TOC team, she was started on low-dose spironolactone . She denies chest pain, palpitations, dyspnea, pnd, orthopnea, n, v, dizziness, syncope, edema, weight gain, or early satiety.  Procedure:  Left Ureteroscopy and Retrograde Pyelogram   Date of Surgery:  Clearance 07/21/24                                  Surgeon:  Dr. Ricardo Likens Surgeon's Group or Practice Name:  [facility] Phone number:  [phone]  Fax number:  [phone]  ROS: Review of Systems  Constitutional: Negative.   HENT: Negative.    Eyes: Negative.   Respiratory: Negative.    Cardiovascular: Negative.   Musculoskeletal:  Positive for back pain and joint pain.  Skin: Negative.   Neurological: Negative.   Endo/Heme/Allergies: Negative.   Psychiatric/Behavioral: Negative.  Studies Reviewed: .        Cardiac Studies & Procedures   ______________________________________________________________________________________________     ECHOCARDIOGRAM  ECHOCARDIOGRAM COMPLETE 05/30/2024  Narrative ECHOCARDIOGRAM REPORT    Patient Name:   [name] [name] [facility] Date of Exam: 05/30/2024 Medical Rec #:  [id number]         Height:       67.0 in Accession #:    [id number]         Weight:       165.0 lb Date of Birth:  12/20/1940         BSA:          1.863 m Patient Age:    83 years          BP:           114/77 mmHg Patient Gender: F                 HR:           71 bpm. Exam Location:  [place]  Procedure: 2D Echo, Cardiac Doppler, Color Doppler and Strain Analysis (Both Spectral and Color Flow Doppler were utilized during procedure).  Indications:    Dyspnea on exertion [R06.09 (ICD-10-CM)]  History:        Patient has prior history of Echocardiogram examinations, most recent 02/12/2024. Cardiomyopathy, CAD and Previous Myocardial Infarction, Arrythmias:Atrial Fibrillation and Tachycardia, Signs/Symptoms:Dyspnea; Risk Factors:Dyslipidemia. Prior echo- EF 25-30% Small pericardial effusion.  Sonographer:    Saddie Chimes Referring Phys: [phone] ROBERT J KRASOWSKI  IMPRESSIONS   1. Left ventricular ejection fraction, by estimation, is 60 to 65%. The left ventricle has normal function. The left ventricle has no regional wall motion abnormalities. There is mild left ventricular hypertrophy. Left ventricular diastolic parameters are consistent with Grade I diastolic dysfunction (impaired relaxation). The average left ventricular global longitudinal strain is 22.3 %. The global longitudinal strain is normal. 2. Right ventricular systolic function is normal. The right ventricular size is normal. 3. The mitral valve is normal in structure. No evidence of mitral valve regurgitation. No evidence of mitral stenosis. 4. The aortic valve is normal in structure. Aortic valve regurgitation is not visualized. No aortic stenosis is present. 5. The inferior vena cava is normal in size with greater than 50% respiratory variability, suggesting right atrial pressure of 3 mmHg.  FINDINGS Left Ventricle: Left ventricular ejection fraction, by estimation, is 60 to 65%. The left ventricle has normal function. The left ventricle has no regional wall motion abnormalities. The average  left ventricular global longitudinal strain is 22.3 %. Strain was performed and the global longitudinal strain is normal. The left ventricular internal cavity size was normal in size. There is mild left ventricular hypertrophy. Left ventricular diastolic parameters are consistent with Grade I diastolic dysfunction (impaired relaxation).  Right Ventricle: The right ventricular size is normal. No increase in right ventricular wall thickness. Right ventricular systolic function is normal.  Left Atrium: Left atrial size was normal in size.  Right Atrium: Right atrial size was normal in size.  Pericardium: There is no evidence of pericardial effusion.  Mitral Valve: The mitral valve is normal in structure. No evidence of mitral valve regurgitation. No evidence of mitral valve stenosis. MV peak gradient, 3.4 mmHg. The mean mitral valve gradient is 1.0 mmHg.  Tricuspid Valve: The tricuspid valve is normal in structure. Tricuspid valve regurgitation is not demonstrated. No evidence of tricuspid stenosis.  Aortic Valve: The aortic valve is normal in structure. Aortic valve regurgitation is  not visualized. No aortic stenosis is present. Aortic valve mean gradient measures 3.0 mmHg. Aortic valve peak gradient measures 6.4 mmHg. Aortic valve area, by VTI measures 2.20 cm.  Pulmonic Valve: The pulmonic valve was normal in structure. Pulmonic valve regurgitation is not visualized. No evidence of pulmonic stenosis.  Aorta: The aortic root is normal in size and structure.  Venous: The inferior vena cava is normal in size with greater than 50% respiratory variability, suggesting right atrial pressure of 3 mmHg.  IAS/Shunts: No atrial level shunt detected by color flow Doppler.   LEFT VENTRICLE PLAX 2D LVIDd:         3.90 cm   Diastology LVIDs:         2.50 cm   LV e' medial:    5.11 cm/s LV PW:         1.10 cm   LV E/e' medial:  11.8 LV IVS:        1.30 cm   LV e' lateral:   7.18 cm/s LVOT diam:      1.90 cm   LV E/e' lateral: 8.4 LV SV:         54 LV SV Index:   29        2D Longitudinal Strain LVOT Area:     2.84 cm  2D Strain GLS Avg:     22.3 %   RIGHT VENTRICLE RV Basal diam:  3.50 cm RV Mid diam:    3.10 cm TAPSE (M-mode): 2.8 cm  LEFT ATRIUM           Index LA diam:      2.90 cm 1.56 cm/m LA Vol (A4C): 19.9 ml 10.68 ml/m AORTIC VALVE                    PULMONIC VALVE AV Area (Vmax):    2.32 cm     PV Vmax:       0.83 m/s AV Area (Vmean):   2.24 cm     PV Vmean:      60.800 cm/s AV Area (VTI):     2.20 cm     PV VTI:        0.181 m AV Vmax:           126.00 cm/s  PV Peak grad:  2.8 mmHg AV Vmean:          81.300 cm/s  PV Mean grad:  2.0 mmHg AV VTI:            0.248 m AV Peak Grad:      6.4 mmHg AV Mean Grad:      3.0 mmHg LVOT Vmax:         103.30 cm/s LVOT Vmean:        64.200 cm/s LVOT VTI:          0.192 m LVOT/AV VTI ratio: 0.77  AORTA Ao Asc diam: 3.70 cm  MITRAL VALVE MV Area (PHT): 1.95 cm    SHUNTS MV Area VTI:   1.32 cm    Systemic VTI:  0.19 m MV Peak grad:  3.4 mmHg    Systemic Diam: 1.90 cm MV Mean grad:  1.0 mmHg MV Vmax:       0.92 m/s MV Vmean:      53.4 cm/s MV Decel Time: 389 msec MV E velocity: 60.30 cm/s MV A velocity: 86.50 cm/s MV E/A ratio:  0.70  Lamar Fitch MD Electronically signed by Lamar Fitch MD Signature Date/Time: 05/30/2024/12:50:54  PM    Final    MONITORS  LONG TERM MONITOR-LIVE TELEMETRY (3-14 DAYS) 12/04/2020  Narrative [name] [name], DOB [birth year], MRN [id number]  EVENT MONITOR REPORT:   Patient was monitored from 11/20/2020 to 11/27/2020. Indication:                    Paroxysmal atrial fibrillation Ordering physician: Dr. Lamar Fitch MD Referring physician:  Dr. Lamar Fitch MD   Baseline rhythm: Sinus  Minimum heart rate: 53 BPM.  Average heart rate: 70 BPM.  Maximal heart rate 108 BPM.  Atrial arrhythmia: Rare brief atrial runs.  Occasional PACs  Ventricular arrhythmia:  Frequent PVCs ] 14.3%]  Conduction abnormality: None significant  Symptoms: Skipped beat and fluttering sensation   Conclusion: Mildly abnormal but overall unremarkable event monitor.  At times the patient had symptoms of palpitations and fluttering and did not correlate with any significant findings on the monitor.  At other times patient had PVCs and ventricular bigeminy when she experienced the symptoms. Interpreting  cardiologist: Naziah Weckerly JONELLE Crape, MD Date: 12/04/2020 11:39 AM       ______________________________________________________________________________________________      Risk Assessment/Calculations:    CHA2DS2-VASc Score = 5   This indicates a 7.2% annual risk of stroke. The patient's score is based upon: CHF History: 1 HTN History: 1 Diabetes History: 0 Stroke History: 0 Vascular Disease History: 0 Age Score: 2 Gender Score: 1   {This patient has a significant risk of stroke if diagnosed with atrial fibrillation.  Please consider VKA or DOAC agent for anticoagulation if the bleeding risk is acceptable.   You can also use the SmartPhrase .[email or web address] for documentation.   :[id number]}      Physical Exam:   VS:  There were no vitals taken for this visit.   Wt Readings from Last 3 Encounters:  07/18/24 164 lb 14.5 oz (74.8 kg)  05/06/24 165 lb (74.8 kg)  04/28/24 165 lb 9.6 oz (75.1 kg)    GEN: Well nourished, well developed in no acute distress NECK: No JVD; No carotid bruits CARDIAC: RRR, no murmurs, rubs, gallops RESPIRATORY:  Clear to auscultation without rales, wheezing or rhonchi  ABDOMEN: Soft, non-tender, non-distended EXTREMITIES:  No edema; No deformity   ASSESSMENT AND PLAN: .   HFrEF-NYHA class II, she appears to be euvolemic.  Most recent echo EF 25% felt to be most consistent with stress cardiomyopathy so no cath was pursued.  Continue losartan  12.5 mg daily, continue Coreg  3.125 mg twice daily, she is not on SGLT2 with recent UTI.  She was  started on low-dose spironolactone  12.5 mg a few days ago by [facility] heart failure team with plans for repeat Pete BMET at her facility next week.  There is very little room to titrate any of her medications any further secondary to hypotension.  CAD -she apparently had a type II STEMI back in 2014, there are no records to review review to determine if there was any stents placed etc. Stable with no anginal symptoms. No indication for ischemic evaluation.    Palpitations -currently quiescent.   PAF/hypercoagulable state/high risk medication use -she is maintaining sinus rhythm, CHA2DS2-VASc score of 5, continue Eliquis  5 mg twice daily--no indication for dose reduction, she denies hematochezia, hematuria, hemoptysis.  Her flecainide  was stopped during her hospitalization secondary to an episode of NSVT and she was transition to amiodarone , continue 200 mg daily.  HTN -blood pressure is on the low normal side at 100/68.  Dispo: She is having labs at her facility in a few days, follow-up with Dr. Bernie in 8 weeks.  Signed, Delon JAYSON Hoover, NP

## 2024-07-20 ENCOUNTER — Encounter: Payer: Self-pay | Admitting: Cardiology

## 2024-07-20 ENCOUNTER — Ambulatory Visit: Attending: Cardiology | Admitting: Cardiology

## 2024-07-20 ENCOUNTER — Telehealth: Payer: Self-pay | Admitting: Cardiology

## 2024-07-20 VITALS — BP 110/66 | HR 72 | Ht 67.0 in | Wt 165.0 lb

## 2024-07-20 DIAGNOSIS — R002 Palpitations: Secondary | ICD-10-CM | POA: Diagnosis not present

## 2024-07-20 DIAGNOSIS — I5020 Unspecified systolic (congestive) heart failure: Secondary | ICD-10-CM

## 2024-07-20 DIAGNOSIS — N184 Chronic kidney disease, stage 4 (severe): Secondary | ICD-10-CM | POA: Diagnosis not present

## 2024-07-20 DIAGNOSIS — Z01818 Encounter for other preprocedural examination: Secondary | ICD-10-CM

## 2024-07-20 DIAGNOSIS — I2510 Atherosclerotic heart disease of native coronary artery without angina pectoris: Secondary | ICD-10-CM

## 2024-07-20 DIAGNOSIS — Z79899 Other long term (current) drug therapy: Secondary | ICD-10-CM

## 2024-07-20 DIAGNOSIS — D6859 Other primary thrombophilia: Secondary | ICD-10-CM | POA: Diagnosis not present

## 2024-07-20 DIAGNOSIS — I5032 Chronic diastolic (congestive) heart failure: Secondary | ICD-10-CM

## 2024-07-20 DIAGNOSIS — I480 Paroxysmal atrial fibrillation: Secondary | ICD-10-CM

## 2024-07-20 NOTE — Anesthesia Preprocedure Evaluation (Signed)
 Anesthesia Evaluation    Airway        Dental   Pulmonary           Cardiovascular hypertension,      Neuro/Psych    GI/Hepatic   Endo/Other    Renal/GU      Musculoskeletal   Abdominal   Peds  Hematology   Anesthesia Other Findings   Reproductive/Obstetrics                              Anesthesia Physical Anesthesia Plan  ASA:   Anesthesia Plan:    Post-op Pain Management:    Induction:   PONV Risk Score and Plan:   Airway Management Planned:   Additional Equipment:   Intra-op Plan:   Post-operative Plan:   Informed Consent:   Plan Discussed with:   Anesthesia Plan Comments: (See PAT note 07/18/24)        Anesthesia Quick Evaluation

## 2024-07-20 NOTE — Patient Instructions (Signed)
 Medication Instructions:  Your physician recommends that you continue on your current medications as directed. Please refer to the Current Medication list given to you today.  *If you need a refill on your cardiac medications before your next appointment, please call your pharmacy*  Lab Work: NONE If you have labs (blood work) drawn today and your tests are completely normal, you will receive your results only by: MyChart Message (if you have MyChart) OR A paper copy in the mail If you have any lab test that is abnormal or we need to change your treatment, we will call you to review the results.  Testing/Procedures: NONE  Follow-Up: At [facility], you and your health needs are our priority.  As part of our continuing mission to provide you with exceptional heart care, our providers are all part of one team.  This team includes your primary Cardiologist (physician) and Advanced Practice Providers or APPs (Physician Assistants and Nurse Practitioners) who all work together to provide you with the care you need, when you need it.  Your next appointment:   3 month(s)  Provider:   Gypsy Balsam, MD    We recommend signing up for the patient portal called "MyChart".  Sign up information is provided on this After Visit Summary.  MyChart is used to connect with patients for Virtual Visits (Telemedicine).  Patients are able to view lab/test results, encounter notes, upcoming appointments, etc.  Non-urgent messages can be sent to your provider as well.   To learn more about what you can do with MyChart, go to [email or web address].   Other Instructions

## 2024-07-20 NOTE — Telephone Encounter (Signed)
 Can we please contact her husband and let him know that based on the lab work that was just checked a few days ago her kidney function has worsened again, this is likely because of her UTI however we need to decrease her Eliquis  dose back to 2.5 mg twice daily.  This medication has to be reduced if your age is greater than 64 and your creatinine is higher than 1.5--she is 83 years old and her most recent creatinine is 1.76, this is around her baseline 2 although she did recently have a creatinine that was normal and it appears that this is why the decision was made to increase her back to 5 mg twice daily.

## 2024-07-20 NOTE — Progress Notes (Signed)
 Anesthesia Chart Review   Case: [id number] Date/Time: 07/21/24 1246   Procedures:      CYSTOSCOPY/URETEROSCOPY/HOLMIUM LASER/STENT PLACEMENT (Left)     CYSTOSCOPY, WITH RETROGRADE PYELOGRAM (Left)   Anesthesia type: General   Diagnosis: Calculus of ureter [N20.1]   Pre-op diagnosis: LEFT URETERAL STONE   Location: [facility]   Surgeons: Alvaro Ricardo KATHEE Mickey., MD       DISCUSSION:83 y.o. never smoker with h/o GERD, HTN, PAF, CAD NSTEMI 2014, CHF,  CKD, s/p right nephrectomy, left ureteral stone scheduled for above procedure 07/21/2024 with Dr. Ricardo Alvaro.   Pt last seen by cardiology 07/20/24. Per OV note, her procedure is scheduled for tomorrow for ureter stent, she is dealing with recurrent UTIs, most recent echocardiogram in August 2025 revealed normalization of her EF.  She is debilitated, wheelchair-bound, but ultimately she is optimized from a cardiac perspective to undergo necessary procedure at an acceptable risk with her known comorbid conditions.  Regarding her Eliquis , it appears this was already communicated to hold 2 to 3 days prior to her surgical procedure and then resumed at the discretion of the surgeon.   Pre Preprocedure Assessment (Date of Service 03/20/20):  h/o difficult intubation for sepsis in [place] ED in 2014; intubation note for cysto in 2017 shows 1 attempt with glidescope; several procedures done successfully with LMA)  MAC and 4 used to intubated, 7.0 mm ETT, 1 attempt 03/23/20. Per Postprocedure Assessment on 03/23/20 (post-right nephrectomy):  Comments: Residual neuromuscular blockade - given additional 200 mg sugammadex  with immediate improvement. Did not require respiratory support or reintubation.  VS: BP 118/74   Pulse 93   Temp 36.4 C (Oral)   Resp 16   Ht 5' 7 (1.702 m)   Wt 74.8 kg   SpO2 97%   BMI 25.83 kg/m   PROVIDERS: Ina Marcellus RAMAN, MD is PCP   Cardiologist - Lamar Fitch, MD  LABS: Labs reviewed: Acceptable for  surgery. (all labs ordered are listed, but only abnormal results are displayed)  Labs Reviewed  BASIC METABOLIC PANEL WITH GFR - Abnormal; Notable for the following components:      Result Value   Sodium 132 (*)    Chloride 97 (*)    Glucose, Bld 100 (*)    BUN 27 (*)    Creatinine, Ser 1.76 (*)    GFR, Estimated 28 (*)    All other components within normal limits  CBC - Abnormal; Notable for the following components:   WBC 19.6 (*)    RDW 16.7 (*)    Platelets 492 (*)    All other components within normal limits     IMAGES:   EKG:   CV: Echo 05/30/2024  1. Left ventricular ejection fraction, by estimation, is 60 to 65%. The  left ventricle has normal function. The left ventricle has no regional  wall motion abnormalities. There is mild left ventricular hypertrophy.  Left ventricular diastolic parameters  are consistent with Grade I diastolic dysfunction (impaired relaxation).  The average left ventricular global longitudinal strain is 22.3 %. The  global longitudinal strain is normal.   2. Right ventricular systolic function is normal. The right ventricular  size is normal.   3. The mitral valve is normal in structure. No evidence of mitral valve  regurgitation. No evidence of mitral stenosis.   4. The aortic valve is normal in structure. Aortic valve regurgitation is  not visualized. No aortic stenosis is present.   5. The inferior  vena cava is normal in size with greater than 50%  respiratory variability, suggesting right atrial pressure of 3 mmHg.   Past Medical History:  Diagnosis Date   Abdominal aortic atherosclerosis 05/07/2016   Noted on CT abd/pelvis   Acute renal failure 06/04/2013   Acute renal failure superimposed on stage 3b chronic kidney disease (HCC) 06/04/2013   Acute respiratory failure with hypoxia (HCC) 06/04/2013   Anemia, unspecified 06/08/2013   Anticoagulant long-term use    ELIQUIS    Anxiety    Arthritis    Cancer (HCC)    uterine  cancer    Chronic anticoagulation 08/04/2017   Chronic diastolic (congestive) heart failure (HCC)     pt denies    Chronic diastolic congestive heart failure (HCC) 05/31/2015   Chronic kidney disease 2014   arf on dialysis in icu   Closed fracture of distal end of right fibula and tibia 02/11/2024   Complicated UTI (urinary tract infection) 03/31/2021   Complication of anesthesia    2021- kidney removed at [facility] pt reports did not get enough med to wake her up and she could not talk and then recovered after given more of wake up med from anesthesia    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 estelle [facility] in [place])  pt uses pessary   Depression    Diarrhea 06/11/2013   Diverticulosis 05/07/2016   Noted on CT abd/pelvis   Dyslipidemia 05/31/2015   E coli bacteremia 06/08/2013   Encounter for monitoring flecainide  therapy 07/30/2016   Essential thrombocythemia (HCC) 03/04/2021   Exposed orthopaedic hardware 04/07/2024   Female genital prolapse 04/15/2016   First degree heart block    General weakness    GERD (gastroesophageal reflux disease)    Hematuria    intermittently due to ureter right stent   Hiatal hernia    High risk medication use 04/15/2016   History of arteriovenostomy for renal dialysis    History of colon polyps    History of gastric polyp    History of kidney infection    12-31-2018  perinephrtic abscess  right side  s/p drains x2 01-10-2019,  05/ 2020 drains removed   History of kidney stones    History of peptic ulcer disease    History of septic shock    2011 (pt unaware) and 06-04-2013  w/ acute renal failure   History of uterine cancer    STAGE I  --  S/P TAH W/ BSO  (NO OTHER TX)   History of ventilator dependency 2014   in setting of septic shock   Hydronephrosis 05/07/2016   Hyperlipemia 04/15/2016   Hyperlipidemia    Hypertension    Idiopathic  cardiomyopathy (HCC) 04/28/2024   Iron deficiency anemia hematology/ oncology--- dr ezzard at [facility] in [place]--- per lov note 08-07-2017  stabilized and felt to be more chronic anemia   01/ 2018  dx iron def. anemia  s/p  IV Iron infusion and taking oral iron supplement   Lactic acidosis 06/04/2013   Lumbar compression fracture (HCC)    01-22-2019 per imaging L1  (06-01-2019 per pt currently no pain)   Midline cystocele 04/15/2016   Nonfunctioning kidney 03/23/2020   NSTEMI, initial episode of care [facility]) 06/07/2013   pt denies    NSVT (nonsustained ventricular tachycardia) (HCC) 02/11/2024   Occlusion of ureteral stent    PAF (paroxysmal atrial fibrillation) (HCC)  DX JUNE 2012   CARDIOLOGIST--  DR BERNIE FLINT--- [facility])  CHADS2   Palpitations 04/15/2016   Paroxysmal atrial fibrillation (HCC) 08/04/2017   Peripherally inserted central catheter (PICC) in place 05/06/2024   Presence of pessary    Pseudomonas infection 05/06/2024   Pyelonephritis 06/04/2013   Pyelonephritis of left kidney 06/04/2013   Right wrist fracture    per pt fractured on 03-10-2019,  had closed reduction and wearing a brace   Sepsis (HCC) 05/07/2016   Sepsis secondary to UTI (HCC) 02/11/2024   Septic shock (HCC) 06/04/2013   IMO SNOMED Dx Update Oct 2024     Surgical wound infection 05/06/2024   Tendonitis, Achilles 04/15/2016   Ureteral obstruction, right 06/04/2013   Ureteral stricture, right urologist-- dr alvaro   Chronic--- treated with ureteral stent   Urinary tract infection without hematuria    Uses walker    and wheelchair for longer distance   Ventricular ectopy 12/28/2020   Vomiting and diarrhea 06/11/2013   Wears glasses     Past Surgical History:  Procedure Laterality Date   APPENDECTOMY     BACK SURGERY     CARDIOVERSION  09/ 2018   dr bernie   successful ([facility] )   CHOLECYSTECTOMY  2010   COLONOSCOPY W/ POLYPECTOMY  05/2017   CYSTO/   BALLOON DILATION RIGHT URETERAL STRICTURE/ STENT PLACEMENT  06-02-2013   CYSTO/  BILATERAL RETROGRADE PYELOGRAM/ RIGHT URETEROSCOPY AND STENT PLACEMENT  10-04-2005   CYSTO/ LEFT URETEROSCOPIC STONE EXTRACTION  04-03-2006   CYSTOSCOPY W/ RETROGRADES Right 01/03/2019   Procedure: CYSTOSCOPY WITH RETROGRADE PYELOGRAM RIGHT WITH STENT EXCHANGE;  Surgeon: Watt Rush, MD;  [facility];  Service: Urology;  Laterality: Right;   CYSTOSCOPY W/ URETERAL STENT PLACEMENT Right 02/06/2014   Procedure: CYSTOSCOPY WITH RIGHT RETROGRADE PYELOGRAM RIGHT STENT REMOVAL and REPLACEMENT, Right Ureteroscopy;  Surgeon: Arlena LILLETTE Gal, MD;  Location: [facility];  Service: Urology;  Laterality: Right;   CYSTOSCOPY W/ URETERAL STENT PLACEMENT Right 11/26/2016   Procedure: CYSTOSCOPY WITH RETROGRADE PYELOGRAM/URETERAL STENT REPLACEMENT;  Surgeon: Ricardo alvaro, MD;  Location[facility];  Service: Urology;  Laterality: Right;   CYSTOSCOPY W/ URETERAL STENT PLACEMENT Right 04/24/2017   Procedure: CYSTOSCOPY WITH RETROGRADE PYELOGRAM/URETERAL STENT REPLACEMENT;  Surgeon: alvaro Ricardo, MD;  Location: [facility];  Service: Urology;  Laterality: Right;   CYSTOSCOPY W/ URETERAL STENT PLACEMENT Right 10/07/2017   Procedure: CYSTOSCOPY WITH RETROGRADE PYELOGRAM/URETERAL STENT EXCHANGE;  Surgeon: alvaro Ricardo, MD;  Location: [facility];  Service: Urology;  Laterality: Right;   CYSTOSCOPY W/ URETERAL STENT PLACEMENT Right 03/03/2018   Procedure: CYSTOSCOPY WITH RIGHT RETROGRADE / RIGHT URETERAL STENT EXCHANGE;  Surgeon: alvaro Ricardo, MD;  [facility];  Service: Urology;  Laterality: Right;   CYSTOSCOPY W/ URETERAL STENT PLACEMENT Right 09/22/2018   Procedure: CYSTOSCOPY WITH RETROGRADE PYELOGRAM/URETERAL STENT PLACEMENT;  Surgeon: alvaro Ricardo, MD;  Location: [facility];  Service: Urology;  Laterality: Right;  45 MINS   CYSTOSCOPY W/  URETERAL STENT PLACEMENT Right 06/03/2019   Procedure: CYSTOSCOPY WITH RETROGRADE PYELOGRAM/URETERAL STENT REPLACEMENT;  Surgeon: alvaro Ricardo, MD;  Location: [facility];  Service: Urology;  Laterality: Right;   CYSTOSCOPY W/ URETERAL STENT PLACEMENT Right 11/23/2019   Procedure: CYSTOSCOPY WITH RETROGRADE PYELOGRAM/URETERAL STENT PLACEMENT;  Surgeon: alvaro Ricardo, MD;  Location: [facility];  Service: Urology;  Laterality: Right;  45 MINS   CYSTOSCOPY WITH LITHOLAPAXY N/A 11/21/2013  Procedure: CYSTOSCOPY WITH LITHOLAPAXY;  Surgeon: Arlena LILLETTE Gal, MD;  Location[facility];  Service: Urology;  Laterality: N/A;   CYSTOSCOPY WITH RETROGRADE PYELOGRAM, URETEROSCOPY AND STENT PLACEMENT Bilateral 03/15/2014   Procedure: CYSTOSCOPY WITH BILATERAL RETROGRADE PYELOGRAM, RIGHT DIAGNOSTIC URETEROSCOPY AND STENT EXCHANGE;  Surgeon: Ricardo Likens, MD;  [facility];  Service: Urology;  Laterality: Bilateral;   CYSTOSCOPY WITH RETROGRADE PYELOGRAM, URETEROSCOPY AND STENT PLACEMENT Left 03/13/2021   Procedure: FIRST STAGE: CYSTOSCOPY WITH RETROGRADE PYELOGRAM, URETEROSCOPY AND STENT PLACEMENT;  Surgeon: Likens Ricardo, MD;  [facility];  Service: Urology;  Laterality: Left;  75 MINS   CYSTOSCOPY WITH RETROGRADE PYELOGRAM, URETEROSCOPY AND STENT PLACEMENT Left 03/29/2021   Procedure: SECOND STGAE:CYSTOSCOPY WITH RETROGRADE PYELOGRAM, URETEROSCOPY AND STENT EXCHANGE;  Surgeon: Likens Ricardo, MD;  [facility];  Service: Urology;  Laterality: Left;   CYSTOSCOPY WITH STENT PLACEMENT Right 05/07/2016   Procedure: CYSTOSCOPY WITH RETROGRADE PYELOGRAM, URETERAL STENT PLACEMENT;  Surgeon: Garnette Shack, MD;  [facility];  Service: Urology;  Laterality: Right;   EYE SURGERY     Bilateral cataract removal   HARDWARE REMOVAL Right 04/07/2024   Procedure: REMOVAL, HARDWARE RIGHT TIBIA;  Surgeon: Kendal Franky SQUIBB, MD;  [facility];  Service:  Orthopedics;  Laterality: Right;   HEMIARTHROPLASTY HIP Right 11/2016   fractured   HOLMIUM LASER APPLICATION Left 03/13/2021   Procedure: HOLMIUM LASER APPLICATION;  Surgeon: Likens Ricardo, MD;  [facility];  Service: Urology;  Laterality: Left;   INCISION AND DRAINAGE OF WOUND Right 04/07/2024   Procedure: IRRIGATION AND DEBRIDEMENT WOUND;  Surgeon: Kendal Franky SQUIBB, MD;  Location[facility];  Service: Orthopedics;  Laterality: Right;   IR CATHETER TUBE CHANGE  02/04/2019   IR CATHETER TUBE CHANGE  02/04/2019   IR CATHETER TUBE CHANGE  02/11/2019   IR RADIOLOGIST EVAL & MGMT  03/09/2019   JOINT REPLACEMENT     LUMBAR FUSION  JULY 2013   NEPHROLITHOTOMY  X2 YRS AGO   OPEN REDUCTION INTERNAL FIXATION (ORIF) TIBIA/FIBULA FRACTURE Right 02/15/2024   Procedure: OPEN REDUCTION INTERNAL FIXATION (ORIF) TIBIA/FIBULA FRACTURE;  Surgeon: Kendal Franky SQUIBB, MD;  [facility];  Service: Orthopedics;  Laterality: Right;   PERCUTANEOUS NEPHROSTOMY  12/2018   ROBOT ASSISTED LAPAROSCOPIC NEPHRECTOMY Right 03/23/2020   Procedure: XI ROBOTIC ASSISTED LAPAROSCOPIC NEPHRECTOMY;  Surgeon: Likens Ricardo, MD;  [facility];  Service: Urology;  Laterality: Right;   TOTAL ABDOMINAL HYSTERECTOMY W/ BILATERAL SALPINGOOPHORECTOMY  1989   TOTAL KNEE ARTHROPLASTY Bilateral 1992  &  1996   TRANSTHORACIC ECHOCARDIOGRAM  07-24-2017   dr bernie   ef 60-65% (improved from last echo 2014, was 45-50%)/  trace TR/  mild AV sclerosis without stenosis   URETEROSCOPY Right 11/21/2013   Procedure: URETEROSCOPY WITH STENT REMOVAL AND REPLACEMENT;  Surgeon: Arlena LILLETTE Gal, MD;  Location: [facility];  Service: Urology;  Laterality: Right;    MEDICATIONS:  acetaminophen  (TYLENOL ) 500 MG tablet   amiodarone  (PACERONE ) 200 MG tablet   amitriptyline  (ELAVIL ) 10 MG tablet   apixaban  (ELIQUIS ) 5 MG TABS tablet   carvedilol  (COREG ) 3.125 MG tablet   hydrALAZINE  (APRESOLINE ) 25 MG tablet   hydrocortisone   (ANUSOL -HC) 25 MG suppository   hydroxyurea  (HYDREA ) 500 MG capsule   loperamide  (IMODIUM  A-D) 2 MG tablet   methocarbamol  (ROBAXIN ) 500 MG tablet   mirtazapine  (REMERON ) 15 MG tablet   omeprazole (PRILOSEC) 40 MG capsule   ondansetron  (ZOFRAN -ODT) 4 MG disintegrating tablet   OVER THE  COUNTER MEDICATION   oxyCODONE  (OXY IR/ROXICODONE ) 5 MG immediate release tablet   polyethylene glycol (MIRALAX  / GLYCOLAX ) 17 g packet   senna-docusate (SENOKOT-S) 8.6-50 MG tablet   Spacer/Aero-Holding Chambers (COMPACT SPACE CHAMBER) DEVI   triamcinolone  cream (KENALOG ) 0.1 %   No current facility-administered medications for this encounter.     Harlene Hoots Ward, PA-C [facility] [phone]

## 2024-07-20 NOTE — Telephone Encounter (Signed)
 LVM and My Chart message regarding Eliquis  dose per Delon Hoover, NP's note

## 2024-07-20 NOTE — Telephone Encounter (Signed)
 Spoke with pt regarding Eliquis  dose change. She verbalized understanding and had no other questions.

## 2024-07-21 ENCOUNTER — Encounter (HOSPITAL_COMMUNITY)
Admission: RE | Disposition: A | Discharge status: Home / Self Care | Payer: Self-pay | Source: Ambulatory Visit | Attending: Urology

## 2024-07-21 ENCOUNTER — Ambulatory Visit (HOSPITAL_COMMUNITY): Payer: Self-pay | Admitting: Medical

## 2024-07-21 ENCOUNTER — Ambulatory Visit (HOSPITAL_COMMUNITY)
Admission: RE | Admit: 2024-07-21 | Discharge: 2024-07-21 | Disposition: A | Discharge status: Home / Self Care | Source: Ambulatory Visit | Attending: Urology | Admitting: Urology

## 2024-07-21 ENCOUNTER — Encounter (HOSPITAL_COMMUNITY): Payer: Self-pay | Admitting: Urology

## 2024-07-21 ENCOUNTER — Ambulatory Visit (HOSPITAL_BASED_OUTPATIENT_CLINIC_OR_DEPARTMENT_OTHER)

## 2024-07-21 ENCOUNTER — Ambulatory Visit (HOSPITAL_COMMUNITY)

## 2024-07-21 DIAGNOSIS — Z87440 Personal history of urinary (tract) infections: Secondary | ICD-10-CM | POA: Insufficient documentation

## 2024-07-21 DIAGNOSIS — Z905 Acquired absence of kidney: Secondary | ICD-10-CM | POA: Insufficient documentation

## 2024-07-21 DIAGNOSIS — I2510 Atherosclerotic heart disease of native coronary artery without angina pectoris: Secondary | ICD-10-CM | POA: Diagnosis not present

## 2024-07-21 DIAGNOSIS — N1832 Chronic kidney disease, stage 3b: Secondary | ICD-10-CM | POA: Insufficient documentation

## 2024-07-21 DIAGNOSIS — Z7901 Long term (current) use of anticoagulants: Secondary | ICD-10-CM | POA: Insufficient documentation

## 2024-07-21 DIAGNOSIS — N281 Cyst of kidney, acquired: Secondary | ICD-10-CM | POA: Insufficient documentation

## 2024-07-21 DIAGNOSIS — I110 Hypertensive heart disease with heart failure: Secondary | ICD-10-CM | POA: Diagnosis not present

## 2024-07-21 DIAGNOSIS — I130 Hypertensive heart and chronic kidney disease with heart failure and stage 1 through stage 4 chronic kidney disease, or unspecified chronic kidney disease: Secondary | ICD-10-CM | POA: Insufficient documentation

## 2024-07-21 DIAGNOSIS — I252 Old myocardial infarction: Secondary | ICD-10-CM | POA: Diagnosis not present

## 2024-07-21 DIAGNOSIS — I5032 Chronic diastolic (congestive) heart failure: Secondary | ICD-10-CM | POA: Insufficient documentation

## 2024-07-21 DIAGNOSIS — N201 Calculus of ureter: Secondary | ICD-10-CM | POA: Diagnosis not present

## 2024-07-21 DIAGNOSIS — N132 Hydronephrosis with renal and ureteral calculous obstruction: Secondary | ICD-10-CM | POA: Diagnosis not present

## 2024-07-21 DIAGNOSIS — I480 Paroxysmal atrial fibrillation: Secondary | ICD-10-CM

## 2024-07-21 DIAGNOSIS — D75839 Thrombocytosis, unspecified: Secondary | ICD-10-CM | POA: Diagnosis not present

## 2024-07-21 HISTORY — PX: CYSTOSCOPY W/ RETROGRADES: SHX1426

## 2024-07-21 HISTORY — PX: CYSTOSCOPY/URETEROSCOPY/HOLMIUM LASER/STENT PLACEMENT: SHX6546

## 2024-07-21 SURGERY — CYSTOSCOPY/URETEROSCOPY/HOLMIUM LASER/STENT PLACEMENT
Anesthesia: General | Site: Ureter | Laterality: Left

## 2024-07-21 MED ORDER — PROPOFOL 10 MG/ML IV BOLUS
INTRAVENOUS | Status: DC | PRN
Start: 2024-07-21 — End: 2024-07-21
  Administered 2024-07-21: 100 mg via INTRAVENOUS
  Administered 2024-07-21: 50 mg via INTRAVENOUS

## 2024-07-21 MED ORDER — DROPERIDOL 2.5 MG/ML IJ SOLN: 0.6250 mg | Freq: Once | INTRAMUSCULAR | Status: DC | PRN

## 2024-07-21 MED ORDER — FENTANYL CITRATE (PF) 100 MCG/2ML IJ SOLN
INTRAMUSCULAR | Status: DC | PRN
  Administered 2024-07-21 (×2): 25 ug via INTRAVENOUS

## 2024-07-21 MED ORDER — ONDANSETRON HCL 4 MG/2ML IJ SOLN
INTRAMUSCULAR | Status: DC | PRN
  Administered 2024-07-21: 4 mg via INTRAVENOUS

## 2024-07-21 MED ORDER — LIDOCAINE HCL (PF) 2 % IJ SOLN
INTRAMUSCULAR | Status: DC | PRN
  Administered 2024-07-21: 80 mg via INTRADERMAL

## 2024-07-21 MED ORDER — PHENYLEPHRINE 80 MCG/ML (10ML) SYRINGE FOR IV PUSH (FOR BLOOD PRESSURE SUPPORT)
PREFILLED_SYRINGE | INTRAVENOUS | Status: DC | PRN
  Administered 2024-07-21 (×3): 160 ug via INTRAVENOUS
  Administered 2024-07-21: 120 ug via INTRAVENOUS

## 2024-07-21 MED ORDER — CIPROFLOXACIN HCL 250 MG PO TABS: 250.0000 mg | ORAL_TABLET | Freq: Two times a day (BID) | ORAL | 0 refills | Status: AC

## 2024-07-21 MED ORDER — PROPOFOL 10 MG/ML IV BOLUS
INTRAVENOUS | Status: AC
  Filled 2024-07-21: qty 20

## 2024-07-21 MED ORDER — OXYCODONE HCL 5 MG PO TABS: 5.0000 mg | ORAL_TABLET | Freq: Four times a day (QID) | ORAL | 0 refills | Status: AC | PRN

## 2024-07-21 MED ORDER — CHLORHEXIDINE GLUCONATE 0.12 % MT SOLN
15.0000 mL | Freq: Once | OROMUCOSAL | Status: AC
  Administered 2024-07-21: 15 mL via OROMUCOSAL

## 2024-07-21 MED ORDER — PHENYLEPHRINE 80 MCG/ML (10ML) SYRINGE FOR IV PUSH (FOR BLOOD PRESSURE SUPPORT)
PREFILLED_SYRINGE | INTRAVENOUS | Status: AC
  Filled 2024-07-21: qty 10

## 2024-07-21 MED ORDER — DEXAMETHASONE SODIUM PHOSPHATE 10 MG/ML IJ SOLN
INTRAMUSCULAR | Status: AC
  Filled 2024-07-21: qty 1

## 2024-07-21 MED ORDER — ONDANSETRON HCL 4 MG/2ML IJ SOLN
INTRAMUSCULAR | Status: AC
  Filled 2024-07-21: qty 2

## 2024-07-21 MED ORDER — EPHEDRINE SULFATE-NACL 50-0.9 MG/10ML-% IV SOSY
PREFILLED_SYRINGE | INTRAVENOUS | Status: DC | PRN
  Administered 2024-07-21 (×2): 10 mg via INTRAVENOUS
  Administered 2024-07-21: 5 mg via INTRAVENOUS

## 2024-07-21 MED ORDER — FENTANYL CITRATE PF 50 MCG/ML IJ SOSY: 25.0000 ug | PREFILLED_SYRINGE | INTRAMUSCULAR | Status: DC | PRN

## 2024-07-21 MED ORDER — FENTANYL CITRATE (PF) 100 MCG/2ML IJ SOLN
INTRAMUSCULAR | Status: AC
  Filled 2024-07-21: qty 2

## 2024-07-21 MED ORDER — LIDOCAINE HCL (PF) 2 % IJ SOLN
INTRAMUSCULAR | Status: AC
  Filled 2024-07-21: qty 5

## 2024-07-21 MED ORDER — OXYCODONE HCL 5 MG PO TABS
5.0000 mg | ORAL_TABLET | Freq: Once | ORAL | Status: AC | PRN
  Administered 2024-07-21: 5 mg via ORAL

## 2024-07-21 MED ORDER — ONDANSETRON HCL 4 MG/2ML IJ SOLN: 4.0000 mg | Freq: Once | INTRAMUSCULAR | Status: DC | PRN

## 2024-07-21 MED ORDER — OXYCODONE HCL 5 MG PO TABS
ORAL_TABLET | ORAL | Status: AC
  Filled 2024-07-21: qty 1

## 2024-07-21 MED ORDER — OXYCODONE HCL 5 MG/5ML PO SOLN: 5.0000 mg | Freq: Once | ORAL | Status: AC | PRN

## 2024-07-21 MED ORDER — SODIUM CHLORIDE 0.9 % IR SOLN
Status: DC | PRN
  Administered 2024-07-21: 3000 mL

## 2024-07-21 MED ORDER — IOHEXOL 300 MG/ML  SOLN
INTRAMUSCULAR | Status: DC | PRN
  Administered 2024-07-21: 20 mL

## 2024-07-21 MED ORDER — ACETAMINOPHEN 10 MG/ML IV SOLN: 1000.0000 mg | Freq: Once | INTRAVENOUS | Status: DC | PRN

## 2024-07-21 MED ORDER — LACTATED RINGERS IV SOLN: INTRAVENOUS | Status: DC

## 2024-07-21 MED ORDER — EPHEDRINE 5 MG/ML INJ
INTRAVENOUS | Status: AC
  Filled 2024-07-21: qty 5

## 2024-07-21 MED ORDER — ORAL CARE MOUTH RINSE: 15.0000 mL | Freq: Once | OROMUCOSAL | Status: AC

## 2024-07-21 MED ORDER — PIPERACILLIN-TAZOBACTAM 3.375 G IVPB 30 MIN
3.3750 g | Freq: Once | INTRAVENOUS | Status: AC
  Administered 2024-07-21: 3.375 g via INTRAVENOUS
  Filled 2024-07-21: qty 50

## 2024-07-21 MED ORDER — PROMETHAZINE HCL 25 MG PO TABS: 25.0000 mg | ORAL_TABLET | Freq: Four times a day (QID) | ORAL | 3 refills | Status: AC | PRN

## 2024-07-21 SURGICAL SUPPLY — 21 items
BAG URO CATCHER STRL LF (MISCELLANEOUS) ×1 IMPLANT
BASKET LASER NITINOL 1.9FR (BASKET) IMPLANT
CATH URETL OPEN END 6FR 70 (CATHETERS) ×1 IMPLANT
CLOTH BEACON ORANGE TIMEOUT ST (SAFETY) ×1 IMPLANT
EXTRACTOR STONE NITINOL NGAGE (UROLOGICAL SUPPLIES) IMPLANT
GLOVE SURG LX STRL 7.5 STRW (GLOVE) ×1 IMPLANT
GOWN STRL REUS W/ TWL XL LVL3 (GOWN DISPOSABLE) ×1 IMPLANT
GUIDEWIRE ANG ZIPWIRE 038X150 (WIRE) ×1 IMPLANT
GUIDEWIRE STR DUAL SENSOR (WIRE) ×1 IMPLANT
KIT TURNOVER KIT A (KITS) ×1 IMPLANT
MANIFOLD NEPTUNE II (INSTRUMENTS) ×1 IMPLANT
NS IRRIG 1000ML POUR BTL (IV SOLUTION) IMPLANT
PACK CYSTO (CUSTOM PROCEDURE TRAY) ×1 IMPLANT
SHEATH NAVIGATOR HD 11/13X28 (SHEATH) IMPLANT
SHEATH NAVIGATOR HD 11/13X36 (SHEATH) IMPLANT
STENT POLARIS 5FRX22 (STENTS) IMPLANT
TRACTIP FLEXIVA PULS ID 200XHI (Laser) IMPLANT
TRACTIP FLEXIVA PULSE ID 200 (Laser) IMPLANT
TUBE PU 8FR 16IN ENFIT (TUBING) ×1 IMPLANT
TUBING CONNECTING 10 (TUBING) ×1 IMPLANT
TUBING UROLOGY SET (TUBING) ×1 IMPLANT

## 2024-07-21 NOTE — Op Note (Signed)
 [name] [name], GEHLING MEDICAL RECORD NO: [id number] ACCOUNT NO: [id number] DATE OF BIRTH: [birth year] FACILITY: [facility] LOCATION: [facility] PHYSICIAN: Ricardo Likens, MD  Operative Report   DATE OF PROCEDURE: 07/21/2024  PREOPERATIVE DIAGNOSES: 1.  Solitary left kidney. 2.  Left ureteral stone with partial obstruction.  POSTOPERATIVE DIAGNOSES: 1.  Solitary left kidney. 2.  Left ureteral stone with partial obstruction.  PROCEDURE PERFORMED: 1.  Cystoscopy with left retrograde pyelograms with interpretation. 2.  Left ureteroscopy with laser lithotripsy. 3.  Insertion of left ureteral stent.  ESTIMATED BLOOD LOSS:  Nil.  COMPLICATIONS:  None.  SPECIMENS:  Left proximal ureteral stone for composite analysis.  FINDINGS: 1.  Left proximal UPJ area stone with partial obstruction. 2.  Left hydronephrosis. 3.  Successful placement of left ureteral stent, proximal end in the renal pelvis and distal end in the urinary bladder with tether.  INDICATIONS:  The patient is a pleasant but very comorbid 83 year old lady with a history of a solitary left kidney and known urolithiasis.  She is very compliant with surveillance imaging of this.  She was found on her most recent surveillance imaging  to have some new hydronephrosis of the left kidney but without acute renal failure.  This is concerning for possible partial obstruction most likely from stone.  I counseled her towards ureteroscopy.  She presents for this today.  She has been on  preoperative Cipro  according to culture and to reduce colonization.  Informed consent was obtained and placed in the medical record.  DESCRIPTION OF PROCEDURE:  The patient being identified, verified and the procedure being left ureteroscopic stone manipulation was confirmed.  Procedure time out was performed.  Intravenous antibiotics were administered.  General anesthesia was induced.   The patient was placed into low lithotomy position and a sterile field was  created, prepped and draped the patient's vagina, introitus and proximal thighs using iodine.  Cystourethroscopy was performed using a 21-French rigid cystoscope with offset  lens.  Inspection of the urinary bladder revealed no diverticula, calcifications or papillary lesions.  The left ureteral orifice was cannulated with a 6-French end-hole catheter and a left retrograde pyelogram was obtained.  Left retrograde pyelogram demonstrated single left ureter, single system left kidney.  At this point, there was mild to moderate hydronephrosis noted, but no obvious filling defects.  A 0.038 ZIPwire was advanced to the level of the upper pole and set  side as a safety wire.  An 8-French feeding tube was placed in the urinary bladder for pressure release.  Semirigid ureteroscopy was performed up the distal left ureter alongside a separate sensory working wire.  This allowed inspection of the distal  four-fifths of the ureter.  There were no mucosal abnormalities or stones seen whatsoever.  The semirigid scope was then exchanged for a short length ureteral access sheath to the level of the proximal ureter using continuous fluoroscopic guidance and  flexible digital ureteroscopy was performed in the proximal left ureter and systematic inspection of the left kidney.  In the very proximal UPJ area, a solitary stone was found.  It was approximately 7 mm in diameter.  There was no impaction.  This was  minimally obstructing.  Holmium laser energy was then applied to stone using 0.2 joules and 20 hertz to fragment it into 3 smaller pieces, which were then sequentially removed with the NGage type basket and set aside for composition analysis.  Inspection  of the entire kidney and all calyces x3 revealed just some dilation, but no additional  stones whatsoever.  It was quite favorable.  Given access sheath usage in a solitary kidney, as well as a brief interval stenting with tether was felt to be most  prudent and a new 5 x  22 polaris type stent was carefully placed using fluoroscopic guidance.  Good proximal and distal end were noted.  The tether was left in place, trimmed to length, and tucked into the vagina.  The procedure was then terminated.  The  patient tolerated the procedure well.  No immediate perioperative complications.  The patient was taken to the postoperative care unit in stable condition with plan for discharge home.   PUS D: 07/21/2024 2:07:11 pm T: 07/21/2024 7:07:00 pm  JOB: [id number]/ [id number]

## 2024-07-21 NOTE — H&P (Signed)
 [name] [name] is an 83 y.o. female.    Chief Complaint: Pre-OP LEFT Ureteroscopic STone Manipulation  HPI:   1 - Rt Chronic Hydronephrosis / Partial Ureteral Stricture / Atrophic Kidney - s/p Rt robotic nephrectomy 02/2020 for atrophic kidney with multiple prior severe infections some requiring ICU admissions and multiple perc drains. Path benign.   2 - Recurrent Nephrolithiasis - Prior composition CaPO4, CaOx  Summarized Course:  Pre 2014 - URS x several, open ureterolithotomy on Left, SWL on right x2  03/2021 - Staged URS for LLP 2cm stone on eval recurrent infections.  12/2021 - KUB, RUS - punctate upper pole stone, mild calectasis, stable Lt cysts; 06/2022 - KUB RUS - completely stable renal cysts and pap tip calcs, no hydro  12/2023 - KUB, RUS - stable renal cysts and pap tip calcs, no hydro;  06/2024 - KUB, Renal US  - new left hydro.    3 - Medical Stone Disease / Oliguria:  Eval 2015: BMP,PTH,URate - normal; Composition - CaPO4, CaOx; 24 Hr Urines - low volume (1.2 L) otherwise unremarkable ==> Indapamide 1.25 (held from intolerance)   4 - Solitary Kdiney - s/p right nephrectomy 2021. GFR remains excellent as per above.   5 - Non-COmples Left Renal Cysts - 6cm upper, 1.5cm lower cysts on CT and US  x several. NO mass effect.   6 - Recurrent Cystitis - long h.o erecurrent cystitis with stone / atrophic kidney nidues. Most recent CX 2025 klebsiella, providencii sens cipro , ceftaz, zosyn .   PMH sig for AFib / Thrombocytosis / Eliquus (primary prevention only, cardioversion 2018, follows Dr Diane Cards in [place]) , lap chole, hyst, left open ureterolithotomy, ureteroscopy x several, shockwave lithotripsy. Her PCP is Marcellus Baptist MD with [facility] in [place].    Today [name] [name] is seen to proceed with LEFT ureteroscopy for suspect left ureteral stone in solitary kidneyh. Has been on Cipro  pre-op accordign to CX. Cr 1.7 most recnetly.   Past Medical History:  Diagnosis Date    Abdominal aortic atherosclerosis 05/07/2016   Noted on CT abd/pelvis   Acute renal failure 06/04/2013   Acute renal failure superimposed on stage 3b chronic kidney disease (HCC) 06/04/2013   Acute respiratory failure with hypoxia (HCC) 06/04/2013   Anemia, unspecified 06/08/2013   Anticoagulant long-term use    ELIQUIS    Anxiety    Arthritis    Cancer (HCC)    uterine cancer    Chronic anticoagulation 08/04/2017   Chronic diastolic (congestive) heart failure (HCC)     pt denies    Chronic diastolic congestive heart failure (HCC) 05/31/2015   Chronic kidney disease 2014   arf on dialysis in icu   Closed fracture of distal end of right fibula and tibia 02/11/2024   Complicated UTI (urinary tract infection) 03/31/2021   Complication of anesthesia    2021- kidney removed at [facility] pt reports did not get enough med to wake her up and she could not talk and then recovered after given more of wake up med from anesthesia    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 estelle [facility] in [place])  pt uses pessary   Depression    Diarrhea 06/11/2013   Diverticulosis 05/07/2016   Noted on CT abd/pelvis   Dyslipidemia 05/31/2015   E coli bacteremia 06/08/2013   Encounter for monitoring flecainide  therapy 07/30/2016   Essential thrombocythemia (HCC) 03/04/2021  Exposed orthopaedic hardware 04/07/2024   Female genital prolapse 04/15/2016   First degree heart block    General weakness    GERD (gastroesophageal reflux disease)    Hematuria    intermittently due to ureter right stent   Hiatal hernia    High risk medication use 04/15/2016   History of arteriovenostomy for renal dialysis    History of colon polyps    History of gastric polyp    History of kidney infection    12-31-2018  perinephrtic abscess  right side  s/p drains x2 01-10-2019,  05/ 2020 drains removed   History of kidney  stones    History of peptic ulcer disease    History of septic shock    2011 (pt unaware) and 06-04-2013  w/ acute renal failure   History of uterine cancer    STAGE I  --  S/P TAH W/ BSO  (NO OTHER TX)   History of ventilator dependency 2014   in setting of septic shock   Hydronephrosis 05/07/2016   Hyperlipemia 04/15/2016   Hyperlipidemia    Hypertension    Idiopathic cardiomyopathy (HCC) 04/28/2024   Iron deficiency anemia hematology/ oncology--- dr ezzard at [facility] in [place]--- per lov note 08-07-2017  stabilized and felt to be more chronic anemia   01/ 2018  dx iron def. anemia  s/p  IV Iron infusion and taking oral iron supplement   Lactic acidosis 06/04/2013   Lumbar compression fracture (HCC)    01-22-2019 per imaging L1  (06-01-2019 per pt currently no pain)   Midline cystocele 04/15/2016   Nonfunctioning kidney 03/23/2020   NSTEMI, initial episode of care [facility]) 06/07/2013   pt denies    NSVT (nonsustained ventricular tachycardia) (HCC) 02/11/2024   Occlusion of ureteral stent    PAF (paroxysmal atrial fibrillation) (HCC) DX JUNE 2012   CARDIOLOGIST--  DR BERNIE FLINT--- [facility])  CHADS2   Palpitations 04/15/2016   Paroxysmal atrial fibrillation (HCC) 08/04/2017   Peripherally inserted central catheter (PICC) in place 05/06/2024   Presence of pessary    Pseudomonas infection 05/06/2024   Pyelonephritis 06/04/2013   Pyelonephritis of left kidney 06/04/2013   Right wrist fracture    per pt fractured on 03-10-2019,  had closed reduction and wearing a brace   Sepsis (HCC) 05/07/2016   Sepsis secondary to UTI (HCC) 02/11/2024   Septic shock (HCC) 06/04/2013   IMO SNOMED Dx Update Oct 2024     Surgical wound infection 05/06/2024   Tendonitis, Achilles 04/15/2016   Ureteral obstruction, right 06/04/2013   Ureteral stricture, right urologist-- dr alvaro   Chronic--- treated with ureteral stent   Urinary tract infection without  hematuria    Uses walker    and wheelchair for longer distance   Ventricular ectopy 12/28/2020   Vomiting and diarrhea 06/11/2013   Wears glasses     Past Surgical History:  Procedure Laterality Date   APPENDECTOMY     BACK SURGERY     CARDIOVERSION  09/ 2018   dr bernie   successful ([facility] )   CHOLECYSTECTOMY  2010   COLONOSCOPY W/ POLYPECTOMY  05/2017   CYSTO/  BALLOON DILATION RIGHT URETERAL STRICTURE/ STENT PLACEMENT  06-02-2013   CYSTO/  BILATERAL RETROGRADE PYELOGRAM/ RIGHT URETEROSCOPY AND STENT PLACEMENT  10-04-2005   CYSTO/ LEFT URETEROSCOPIC STONE EXTRACTION  04-03-2006   CYSTOSCOPY W/ RETROGRADES Right 01/03/2019   Procedure: CYSTOSCOPY WITH RETROGRADE PYELOGRAM RIGHT WITH STENT EXCHANGE;  Surgeon: Watt Rush, MD;  Location: [facility];  Service: Urology;  Laterality: Right;   CYSTOSCOPY W/ URETERAL STENT PLACEMENT Right 02/06/2014   Procedure: CYSTOSCOPY WITH RIGHT RETROGRADE PYELOGRAM RIGHT STENT REMOVAL and REPLACEMENT, Right Ureteroscopy;  Surgeon: Arlena LILLETTE Gal, MD;  Location: [facility];  Service: Urology;  Laterality: Right;   CYSTOSCOPY W/ URETERAL STENT PLACEMENT Right 11/26/2016   Procedure: CYSTOSCOPY WITH RETROGRADE PYELOGRAM/URETERAL STENT REPLACEMENT;  Surgeon: Ricardo Likens, MD;  Location: [facility];  Service: Urology;  Laterality: Right;   CYSTOSCOPY W/ URETERAL STENT PLACEMENT Right 04/24/2017   Procedure: CYSTOSCOPY WITH RETROGRADE PYELOGRAM/URETERAL STENT REPLACEMENT;  Surgeon: Likens Ricardo, MD;  Location: [facility];  Service: Urology;  Laterality: Right;   CYSTOSCOPY W/ URETERAL STENT PLACEMENT Right 10/07/2017   Procedure: CYSTOSCOPY WITH RETROGRADE PYELOGRAM/URETERAL STENT EXCHANGE;  Surgeon: Likens Ricardo, MD;  Location: [facility];  Service: Urology;  Laterality: Right;   CYSTOSCOPY W/ URETERAL STENT PLACEMENT Right 03/03/2018   Procedure: CYSTOSCOPY WITH RIGHT RETROGRADE / RIGHT  URETERAL STENT EXCHANGE;  Surgeon: Likens Ricardo, MD;  Location: [facility];  Service: Urology;  Laterality: Right;   CYSTOSCOPY W/ URETERAL STENT PLACEMENT Right 09/22/2018   Procedure: CYSTOSCOPY WITH RETROGRADE PYELOGRAM/URETERAL STENT PLACEMENT;  Surgeon: Likens Ricardo, MD;  Location: [facility];  Service: Urology;  Laterality: Right;  45 MINS   CYSTOSCOPY W/ URETERAL STENT PLACEMENT Right 06/03/2019   Procedure: CYSTOSCOPY WITH RETROGRADE PYELOGRAM/URETERAL STENT REPLACEMENT;  Surgeon: Likens Ricardo, MD;  Location: [facility];  Service: Urology;  Laterality: Right;   CYSTOSCOPY W/ URETERAL STENT PLACEMENT Right 11/23/2019   Procedure: CYSTOSCOPY WITH RETROGRADE PYELOGRAM/URETERAL STENT PLACEMENT;  Surgeon: Likens Ricardo, MD;  Location: [facility];  Service: Urology;  Laterality: Right;  45 MINS   CYSTOSCOPY WITH LITHOLAPAXY N/A 11/21/2013   Procedure: CYSTOSCOPY WITH LITHOLAPAXY;  Surgeon: Arlena LILLETTE Gal, MD;  Location: [facility];  Service: Urology;  Laterality: N/A;   CYSTOSCOPY WITH RETROGRADE PYELOGRAM, URETEROSCOPY AND STENT PLACEMENT Bilateral 03/15/2014   Procedure: CYSTOSCOPY WITH BILATERAL RETROGRADE PYELOGRAM, RIGHT DIAGNOSTIC URETEROSCOPY AND STENT EXCHANGE;  Surgeon: Ricardo Likens, MD;  Location[facility];  Service: Urology;  Laterality: Bilateral;   CYSTOSCOPY WITH RETROGRADE PYELOGRAM, URETEROSCOPY AND STENT PLACEMENT Left 03/13/2021   Procedure: FIRST STAGE: CYSTOSCOPY WITH RETROGRADE PYELOGRAM, URETEROSCOPY AND STENT PLACEMENT;  Surgeon: Likens Ricardo, MD;  Location: [facility];  Service: Urology;  Laterality: Left;  75 MINS   CYSTOSCOPY WITH RETROGRADE PYELOGRAM, URETEROSCOPY AND STENT PLACEMENT Left 03/29/2021   Procedure: SECOND STGAE:CYSTOSCOPY WITH RETROGRADE PYELOGRAM, URETEROSCOPY AND STENT EXCHANGE;  Surgeon: Likens Ricardo, MD;  Location: [facility];  Service: Urology;  Laterality: Left;   CYSTOSCOPY WITH STENT  PLACEMENT Right 05/07/2016   Procedure: CYSTOSCOPY WITH RETROGRADE PYELOGRAM, URETERAL STENT PLACEMENT;  Surgeon: Garnette Shack, MD;  Location: [facility];  Service: Urology;  Laterality: Right;   EYE SURGERY     Bilateral cataract removal   HARDWARE REMOVAL Right 04/07/2024   Procedure: REMOVAL, HARDWARE RIGHT TIBIA;  Surgeon: Kendal Franky SQUIBB, MD;  Location: [facility];  Service: Orthopedics;  Laterality: Right;   HEMIARTHROPLASTY HIP Right 11/2016   fractured   HOLMIUM LASER APPLICATION Left 03/13/2021   Procedure: HOLMIUM LASER APPLICATION;  Surgeon: Likens Ricardo, MD;  Location: [facility];  Service: Urology;  Laterality: Left;   INCISION AND DRAINAGE OF WOUND Right 04/07/2024   Procedure: IRRIGATION AND DEBRIDEMENT WOUND;  Surgeon: Kendal Franky SQUIBB, MD;  Location: [facility];  Service: Orthopedics;  Laterality:  Right;   IR CATHETER TUBE CHANGE  02/04/2019   IR CATHETER TUBE CHANGE  02/04/2019   IR CATHETER TUBE CHANGE  02/11/2019   IR RADIOLOGIST EVAL & MGMT  03/09/2019   JOINT REPLACEMENT     LUMBAR FUSION  JULY 2013   NEPHROLITHOTOMY  X2 YRS AGO   OPEN REDUCTION INTERNAL FIXATION (ORIF) TIBIA/FIBULA FRACTURE Right 02/15/2024   Procedure: OPEN REDUCTION INTERNAL FIXATION (ORIF) TIBIA/FIBULA FRACTURE;  Surgeon: Kendal Franky SQUIBB, MD;  Location: [facility];  Service: Orthopedics;  Laterality: Right;   PERCUTANEOUS NEPHROSTOMY  12/2018   ROBOT ASSISTED LAPAROSCOPIC NEPHRECTOMY Right 03/23/2020   Procedure: XI ROBOTIC ASSISTED LAPAROSCOPIC NEPHRECTOMY;  Surgeon: Alvaro Hummer, MD;  Location: [facility];  Service: Urology;  Laterality: Right;   TOTAL ABDOMINAL HYSTERECTOMY W/ BILATERAL SALPINGOOPHORECTOMY  1989   TOTAL KNEE ARTHROPLASTY Bilateral 1992  &  1996   TRANSTHORACIC ECHOCARDIOGRAM  07-24-2017   dr bernie   ef 60-65% (improved from last echo 2014, was 45-50%)/  trace TR/  mild AV sclerosis without stenosis   URETEROSCOPY Right 11/21/2013   Procedure: URETEROSCOPY WITH STENT REMOVAL AND REPLACEMENT;   Surgeon: Arlena LILLETTE Gal, MD;  [facility];  Service: Urology;  Laterality: Right;    Family History  Problem Relation Age of Onset   Heart disease Mother    Heart failure Mother    Cancer Mother    Heart disease Father    Heart failure Father    Social History:  reports that she has never smoked. She has never used smokeless tobacco. She reports that she does not drink alcohol and does not use drugs.  Allergies:  Allergies  Allergen Reactions   Codeine Nausea Only and Other (See Comments)    hallucinations   Hydrocodone  Nausea Only and Other (See Comments)   Macrodantin  [Nitrofurantoin ] Nausea And Vomiting    No medications prior to admission.    No results found for this or any previous visit (from the past 48 hours). No results found.  Review of Systems  Constitutional:  Negative for chills and fever.  All other systems reviewed and are negative.   There were no vitals taken for this visit. Physical Exam Vitals reviewed.  Constitutional:      Comments: Obese frailty, at baseline. Veyr pleasant.   HENT:     Head: Normocephalic.  Eyes:     Pupils: Pupils are equal, round, and reactive to light.  Cardiovascular:     Rate and Rhythm: Normal rate.  Pulmonary:     Effort: Pulmonary effort is normal.  Genitourinary:    Comments: No CVAT at present Musculoskeletal:        General: Normal range of motion.     Cervical back: Normal range of motion.  Neurological:     General: No focal deficit present.     Mental Status: She is alert.  Psychiatric:        Mood and Affect: Mood normal.      Assessment/Plan  Proceed as planned with LEFT ureteroscopic stone manipulation. Risks, benefits, alternatives, expected peri-op course discussed previously and reiteratred today including need for temporary stent  as solitary kidney.   Hummer KATHEE Alvaro Mickey., MD 07/21/2024, 9:33 AM

## 2024-07-21 NOTE — Transfer of Care (Signed)
 Immediate Anesthesia Transfer of Care Note  Patient: [name] [name]  Procedure(s) Performed: CYSTOSCOPY/URETEROSCOPY/HOLMIUM LASER/STENT PLACEMENT (Left: Ureter) CYSTOSCOPY, WITH RETROGRADE PYELOGRAM (Left)  Patient Location: PACU  Anesthesia Type:General  Level of Consciousness: drowsy and patient cooperative  Airway & Oxygen Therapy: Patient Spontanous Breathing and Patient connected to face mask oxygen  Post-op Assessment: Report given to RN and Post -op Vital signs reviewed and stable  Post vital signs: Reviewed and stable  Last Vitals:  Vitals Value Taken Time  BP 126/57 07/21/24 14:12  Temp 97.60F oral 07/21/24 14:15  Pulse 59 07/21/24 14:13  Resp 18 07/21/24 14:13  SpO2 100 % 07/21/24 14:13  Vitals shown include unfiled device data.  Last Pain:  Vitals:   07/21/24 1134  TempSrc: Oral  PainSc: 0-No pain      Patients Stated Pain Goal: 4 (07/21/24 1134)  Complications: No notable events documented.

## 2024-07-21 NOTE — Brief Op Note (Signed)
 07/21/2024  2:02 PM  PATIENT:  [name] [name]  83 y.o. female  PRE-OPERATIVE DIAGNOSIS:  LEFT URETERAL STONE  POST-OPERATIVE DIAGNOSIS:  * No post-op diagnosis entered *  PROCEDURE:  Procedure(s): CYSTOSCOPY/URETEROSCOPY/HOLMIUM LASER/STENT PLACEMENT (Left) CYSTOSCOPY, WITH RETROGRADE PYELOGRAM (Left)  SURGEON:  Surgeons and Role:    * Manny, Ricardo KATHEE Raddle., MD - Primary  PHYSICIAN ASSISTANT:   ASSISTANTS: none   ANESTHESIA:   general  EBL:  minimal   BLOOD ADMINISTERED:none  DRAINS: none   LOCAL MEDICATIONS USED:  NONE  SPECIMEN:  Source of Specimen:  left UPJ stone  DISPOSITION OF SPECIMEN:  [facility] for compositional analysis  COUNTS:  YES  TOURNIQUET:  * No tourniquets in log *  DICTATION: .Other Dictation: Dictation Number [id number]  PLAN OF CARE: Discharge to home after PACU  PATIENT DISPOSITION:  PACU - hemodynamically stable.   Delay start of Pharmacological VTE agent (>24hrs) due to surgical blood loss or risk of bleeding: yes

## 2024-07-21 NOTE — Discharge Instructions (Addendum)
1 - You may have urinary urgency (bladder spasms) and bloody urine on / off with stent in place. This is normal. ° °2 - Remove tethered stent on Monday morning at home by pulling on string, then blue-white plastic tubing, and discarding. Office is open Monday if any problems arise.  ° °3 - Call MD or go to ER for fever >102, severe pain / nausea / vomiting not relieved by medications, or acute change in medical status ° °

## 2024-07-21 NOTE — Anesthesia Procedure Notes (Signed)
 Procedure Name: LMA Insertion Date/Time: 07/21/2024 1:20 PM  Performed by: Augusta Daved SAILOR, CRNAPre-anesthesia Checklist: Patient identified, Emergency Drugs available, Suction available and Patient being monitored Patient Re-evaluated:Patient Re-evaluated prior to induction Oxygen Delivery Method: Circle System Utilized Preoxygenation: Pre-oxygenation with 100% oxygen Induction Type: IV induction Ventilation: Mask ventilation without difficulty LMA: LMA inserted LMA Size: 4.0 Number of attempts: 1 Airway Equipment and Method: Bite block Placement Confirmation: positive ETCO2 Tube secured with: Tape Dental Injury: Teeth and Oropharynx as per pre-operative assessment

## 2024-07-22 NOTE — Anesthesia Postprocedure Evaluation (Signed)
 Anesthesia Post Note  Patient: Libia Fazzini Meiner  Procedure(s) Performed: CYSTOSCOPY/URETEROSCOPY/HOLMIUM LASER/STENT PLACEMENT (Left: Ureter) CYSTOSCOPY, WITH RETROGRADE PYELOGRAM (Left: Ureter)     Patient location during evaluation: PACU Anesthesia Type: General Level of consciousness: awake and alert Pain management: pain level controlled Vital Signs Assessment: post-procedure vital signs reviewed and stable Respiratory status: spontaneous breathing, nonlabored ventilation, respiratory function stable and patient connected to nasal cannula oxygen Cardiovascular status: blood pressure returned to baseline and stable Postop Assessment: no apparent nausea or vomiting Anesthetic complications: no   No notable events documented.  Last Vitals:  Vitals:   07/21/24 1500 07/21/24 1630  BP:  126/85  Pulse: 75 75  Resp:    Temp:    SpO2: 97% 94%    Last Pain:  Vitals:   07/21/24 1630  TempSrc:   PainSc: Asleep                 Thom JONELLE Peoples

## 2024-07-23 ENCOUNTER — Encounter (HOSPITAL_COMMUNITY): Payer: Self-pay | Admitting: Urology

## 2024-07-25 ENCOUNTER — Encounter (HOSPITAL_COMMUNITY): Payer: Self-pay | Admitting: Urology

## 2024-07-25 DIAGNOSIS — N202 Calculus of kidney with calculus of ureter: Secondary | ICD-10-CM | POA: Diagnosis not present

## 2024-07-27 DIAGNOSIS — Z4689 Encounter for fitting and adjustment of other specified devices: Secondary | ICD-10-CM | POA: Diagnosis not present

## 2024-08-02 DIAGNOSIS — S82841D Displaced bimalleolar fracture of right lower leg, subsequent encounter for closed fracture with routine healing: Secondary | ICD-10-CM | POA: Diagnosis not present

## 2024-08-02 DIAGNOSIS — T8469XD Infection and inflammatory reaction due to internal fixation device of other site, subsequent encounter: Secondary | ICD-10-CM | POA: Diagnosis not present

## 2024-08-02 DIAGNOSIS — S82201D Unspecified fracture of shaft of right tibia, subsequent encounter for closed fracture with routine healing: Secondary | ICD-10-CM | POA: Diagnosis not present

## 2024-08-02 DIAGNOSIS — S82831D Other fracture of upper and lower end of right fibula, subsequent encounter for closed fracture with routine healing: Secondary | ICD-10-CM | POA: Diagnosis not present

## 2024-08-03 ENCOUNTER — Telehealth: Payer: Self-pay | Admitting: *Deleted

## 2024-08-03 DIAGNOSIS — I739 Peripheral vascular disease, unspecified: Secondary | ICD-10-CM

## 2024-08-03 NOTE — Progress Notes (Unsigned)
 [facility]  [address] [place],  [place]  [address] [phone]  Clinic Day:  08/03/2024  Referring physician: Ina Marcellus RAMAN, MD  HISTORY OF PRESENT ILLNESS:  The patient is an 83 y.o. female with essential thrombocythemia, which is based upon her having an elevated platelet count and testing positive for the JAK2 mutation.  She also has a history of iron deficiency anemia.  She takes hydroxyurea  500 mg every Monday, Wednesday and Friday to keep her platelet count under 400-600.  She comes in today to reassess her peripheral counts.  She denies having any side effects from her hydroxyurea .  She also denies having any clotting complications from her underlying essential thrombocythemia.  However, she comes in today not particularly feeling well.  She has had a productive cough, muscle aches, and generalized malaise.  She has already been ruled out for having COVID.  However, she is concerned some form of viral infection is present.  PHYSICAL EXAM:  There were no vitals taken for this visit. Wt Readings from Last 3 Encounters:  07/20/24 165 lb (74.8 kg)  07/18/24 164 lb 14.5 oz (74.8 kg)  05/06/24 165 lb (74.8 kg)   There is no height or weight on file to calculate BMI. Performance status (ECOG): 1 Physical Exam Constitutional:      Appearance: Normal appearance. She is not ill-appearing.     Comments: She appears weaker versus previous visits.  She is in a wheelchair.  HENT:     Mouth/Throat:     Mouth: Mucous membranes are moist.     Pharynx: Oropharynx is clear. No oropharyngeal exudate or posterior oropharyngeal erythema.  Cardiovascular:     Rate and Rhythm: Normal rate and regular rhythm.     Heart sounds: No murmur heard.    No friction rub. No gallop.  Pulmonary:     Effort: Pulmonary effort is normal. No respiratory distress.     Breath sounds: Normal breath sounds. No wheezing, rhonchi or rales.  Abdominal:     General: Bowel sounds  are normal. There is no distension.     Palpations: Abdomen is soft. There is no mass.     Tenderness: There is no abdominal tenderness.  Musculoskeletal:        General: No swelling.     Right lower leg: No edema.     Left lower leg: No edema.  Lymphadenopathy:     Cervical: No cervical adenopathy.     Upper Body:     Right upper body: No supraclavicular or axillary adenopathy.     Left upper body: No supraclavicular or axillary adenopathy.     Lower Body: No right inguinal adenopathy. No left inguinal adenopathy.  Skin:    General: Skin is warm.     Coloration: Skin is not jaundiced.     Findings: No lesion or rash.  Neurological:     General: No focal deficit present.     Mental Status: She is alert and oriented to person, place, and time. Mental status is at baseline.  Psychiatric:        Mood and Affect: Mood normal.        Behavior: Behavior normal.        Thought Content: Thought content normal.    LABS:    Latest Reference Range & Units 01/06/24 09:58  WBC 4.0 - 10.5 K/uL 7.6  RBC 3.87 - 5.11 MIL/uL 5.02  Hemoglobin 12.0 - 15.0 g/dL 85.2  HCT 63.9 -  46.0 % 45.3  MCV 80.0 - 100.0 fL 90.2  MCH 26.0 - 34.0 pg 29.3  MCHC 30.0 - 36.0 g/dL 67.4  RDW 88.4 - 84.4 % 15.1  Platelets 150 - 400 K/uL 426 (H)  nRBC 0.0 - 0.2 % 0 /100 WBC 0.00 0  Neutrophils % 68  Lymphocytes % 11  Monocytes Relative % 15  Eosinophil % 4  Basophil % 1  Immature Granulocytes % 1  (H): Data is abnormally high  ASSESSMENT & PLAN:  Assessment/Plan:  An 83 y.o. female with essential thrombocythemia, as well as a history iron deficiency anemia.  When evaluating her labs today, I am pleased as her platelets remain well below 600.  She will continue to take hydroxyurea  500 mg every Monday, Wednesday, and Friday to keep her platelets from precipitously rising. Of note, she is on Eliquis  due to her history of atrial fibrillation. This agent should also protect her from developing clotting  complications related to her underlying essential thrombocythemia.  As mentioned previously, the major problem today with this patient is a high likelihood of a viral illness.  A respiratory viral culture will be sent, they will evaluate for COVID, influenza A, influenza B, and RSV.  I will notify her of the results as soon as they become available.  Otherwise, as the patient is doing very well from a hematologic perspective, I will see her back in 6 months for repeat clinical assessment.  The patient understands all the plans discussed today and is in agreement with them.    Caroll Cunnington DELENA Kerns, MD

## 2024-08-03 NOTE — Telephone Encounter (Signed)
 Received fax from [facility] requesting order for ABI's to assess blood flow due to discoloration. Fax scanned to chart.

## 2024-08-04 ENCOUNTER — Inpatient Hospital Stay: Admitting: Oncology

## 2024-08-04 ENCOUNTER — Inpatient Hospital Stay: Attending: Oncology

## 2024-08-04 ENCOUNTER — Other Ambulatory Visit: Payer: Self-pay | Admitting: Oncology

## 2024-08-04 VITALS — BP 81/62 | HR 68 | Temp 98.4°F | Resp 14 | Ht 67.0 in

## 2024-08-04 DIAGNOSIS — I4891 Unspecified atrial fibrillation: Secondary | ICD-10-CM | POA: Diagnosis not present

## 2024-08-04 DIAGNOSIS — D473 Essential (hemorrhagic) thrombocythemia: Secondary | ICD-10-CM

## 2024-08-04 DIAGNOSIS — Z7901 Long term (current) use of anticoagulants: Secondary | ICD-10-CM | POA: Insufficient documentation

## 2024-08-04 LAB — CBC WITH DIFFERENTIAL (CANCER CENTER ONLY)
Abs Immature Granulocytes: 0.05 K/uL (ref 0.00–0.07)
Basophils Absolute: 0.1 K/uL (ref 0.0–0.1)
Basophils Relative: 1 %
Eosinophils Absolute: 0.2 K/uL (ref 0.0–0.5)
Eosinophils Relative: 2 %
HCT: 41.6 % (ref 36.0–46.0)
Hemoglobin: 13.3 g/dL (ref 12.0–15.0)
Immature Granulocytes: 1 %
Lymphocytes Relative: 18 %
Lymphs Abs: 1.7 K/uL (ref 0.7–4.0)
MCH: 28.5 pg (ref 26.0–34.0)
MCHC: 32 g/dL (ref 30.0–36.0)
MCV: 89.3 fL (ref 80.0–100.0)
Monocytes Absolute: 0.9 K/uL (ref 0.1–1.0)
Monocytes Relative: 10 %
Neutro Abs: 6.2 K/uL (ref 1.7–7.7)
Neutrophils Relative %: 68 %
Platelet Count: 523 K/uL — ABNORMAL HIGH (ref 150–400)
RBC: 4.66 MIL/uL (ref 3.87–5.11)
RDW: 16.2 % — ABNORMAL HIGH (ref 11.5–15.5)
WBC Count: 9.1 K/uL (ref 4.0–10.5)
nRBC: 0 % (ref 0.0–0.2)

## 2024-08-12 DIAGNOSIS — R399 Unspecified symptoms and signs involving the genitourinary system: Secondary | ICD-10-CM | POA: Diagnosis not present

## 2024-08-15 DIAGNOSIS — N202 Calculus of kidney with calculus of ureter: Secondary | ICD-10-CM | POA: Diagnosis not present

## 2024-08-15 DIAGNOSIS — N3000 Acute cystitis without hematuria: Secondary | ICD-10-CM | POA: Diagnosis not present

## 2024-08-15 DIAGNOSIS — Z905 Acquired absence of kidney: Secondary | ICD-10-CM | POA: Diagnosis not present

## 2024-09-16 DIAGNOSIS — Z23 Encounter for immunization: Secondary | ICD-10-CM | POA: Diagnosis not present

## 2024-10-03 ENCOUNTER — Encounter

## 2024-10-18 ENCOUNTER — Ambulatory Visit: Admitting: Cardiology

## 2024-10-29 ENCOUNTER — Other Ambulatory Visit: Payer: Self-pay | Admitting: Cardiology

## 2024-10-31 ENCOUNTER — Ambulatory Visit

## 2024-11-02 ENCOUNTER — Other Ambulatory Visit: Payer: Self-pay | Admitting: Cardiology

## 2024-11-03 MED ORDER — HYDRALAZINE HCL 25 MG PO TABS: 25.0000 mg | ORAL_TABLET | Freq: Two times a day (BID) | ORAL | 3 refills | Status: AC

## 2024-11-14 ENCOUNTER — Telehealth: Payer: Self-pay | Admitting: Cardiology

## 2024-11-17 ENCOUNTER — Other Ambulatory Visit: Payer: Self-pay

## 2024-11-17 MED ORDER — CARVEDILOL 3.125 MG PO TABS: 3.1250 mg | ORAL_TABLET | Freq: Two times a day (BID) | ORAL | 1 refills | Status: AC

## 2024-11-17 NOTE — Telephone Encounter (Signed)
 Pt calling to f/u on carvedilol . Pt only has 2 pills left. Please advise.

## 2024-11-17 NOTE — Telephone Encounter (Signed)
Refill already been sent -

## 2024-11-29 ENCOUNTER — Ambulatory Visit

## 2024-12-08 ENCOUNTER — Ambulatory Visit

## 2024-12-26 ENCOUNTER — Ambulatory Visit: Admitting: Cardiology

## 2024-12-26 ENCOUNTER — Encounter

## 2025-01-27 ENCOUNTER — Ambulatory Visit: Admitting: Cardiology

## 2025-02-07 ENCOUNTER — Ambulatory Visit: Admitting: Oncology

## 2025-02-07 ENCOUNTER — Other Ambulatory Visit
